# Patient Record
Sex: Female | Born: 1950 | Race: White | Hispanic: No | Marital: Married | Smoking: Former smoker
Health system: Southern US, Community
[De-identification: ages and names within clinical notes are randomized; demographics above are authoritative.]

## PROBLEM LIST (undated history)

## (undated) ENCOUNTER — Emergency Department (HOSPITAL_COMMUNITY): Payer: PPO

## (undated) DIAGNOSIS — R531 Weakness: Secondary | ICD-10-CM

## (undated) DIAGNOSIS — K219 Gastro-esophageal reflux disease without esophagitis: Secondary | ICD-10-CM

## (undated) DIAGNOSIS — M1990 Unspecified osteoarthritis, unspecified site: Secondary | ICD-10-CM

## (undated) DIAGNOSIS — R0600 Dyspnea, unspecified: Secondary | ICD-10-CM

## (undated) DIAGNOSIS — I10 Essential (primary) hypertension: Secondary | ICD-10-CM

## (undated) DIAGNOSIS — H9312 Tinnitus, left ear: Secondary | ICD-10-CM

## (undated) DIAGNOSIS — R112 Nausea with vomiting, unspecified: Secondary | ICD-10-CM

## (undated) DIAGNOSIS — E119 Type 2 diabetes mellitus without complications: Secondary | ICD-10-CM

## (undated) DIAGNOSIS — E8809 Other disorders of plasma-protein metabolism, not elsewhere classified: Secondary | ICD-10-CM

## (undated) DIAGNOSIS — J189 Pneumonia, unspecified organism: Secondary | ICD-10-CM

## (undated) DIAGNOSIS — K5903 Drug induced constipation: Secondary | ICD-10-CM

## (undated) DIAGNOSIS — Z8619 Personal history of other infectious and parasitic diseases: Secondary | ICD-10-CM

## (undated) DIAGNOSIS — E785 Hyperlipidemia, unspecified: Secondary | ICD-10-CM

## (undated) DIAGNOSIS — R5381 Other malaise: Secondary | ICD-10-CM

## (undated) DIAGNOSIS — Z87442 Personal history of urinary calculi: Secondary | ICD-10-CM

## (undated) DIAGNOSIS — C55 Malignant neoplasm of uterus, part unspecified: Secondary | ICD-10-CM

## (undated) DIAGNOSIS — G2581 Restless legs syndrome: Secondary | ICD-10-CM

## (undated) DIAGNOSIS — G709 Myoneural disorder, unspecified: Secondary | ICD-10-CM

## (undated) DIAGNOSIS — G8220 Paraplegia, unspecified: Principal | ICD-10-CM

## (undated) DIAGNOSIS — Z9289 Personal history of other medical treatment: Secondary | ICD-10-CM

## (undated) DIAGNOSIS — F32A Depression, unspecified: Secondary | ICD-10-CM

## (undated) DIAGNOSIS — E46 Unspecified protein-calorie malnutrition: Secondary | ICD-10-CM

## (undated) DIAGNOSIS — Z9989 Dependence on other enabling machines and devices: Secondary | ICD-10-CM

## (undated) DIAGNOSIS — J439 Emphysema, unspecified: Secondary | ICD-10-CM

## (undated) DIAGNOSIS — Z9889 Other specified postprocedural states: Secondary | ICD-10-CM

## (undated) DIAGNOSIS — Z8709 Personal history of other diseases of the respiratory system: Secondary | ICD-10-CM

## (undated) HISTORY — PX: TUMOR EXCISION: SHX421

## (undated) HISTORY — DX: Other malaise: R53.81

## (undated) HISTORY — DX: Unspecified protein-calorie malnutrition: E46

## (undated) HISTORY — PX: BACK SURGERY: SHX140

## (undated) HISTORY — PX: LUMBAR FUSION: SHX111

## (undated) HISTORY — DX: Type 2 diabetes mellitus without complications: E11.9

## (undated) HISTORY — DX: Other disorders of plasma-protein metabolism, not elsewhere classified: E88.09

## (undated) HISTORY — DX: Paraplegia, unspecified: G82.20

## (undated) HISTORY — DX: Gastro-esophageal reflux disease without esophagitis: K21.9

## (undated) HISTORY — DX: Drug induced constipation: K59.03

---
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
## 1977-09-13 DIAGNOSIS — C55 Malignant neoplasm of uterus, part unspecified: Secondary | ICD-10-CM

## 1977-09-13 HISTORY — DX: Malignant neoplasm of uterus, part unspecified: C55

## 1977-09-13 HISTORY — PX: VAGINAL HYSTERECTOMY: SUR661

## 1989-09-13 HISTORY — PX: BLADDER SUSPENSION: SHX72

## 1989-09-13 HISTORY — PX: INGUINAL HERNIA REPAIR: SUR1180

## 1998-03-07 ENCOUNTER — Other Ambulatory Visit: Admission: RE | Admit: 1998-03-07 | Discharge: 1998-03-07 | Payer: Self-pay | Admitting: Otolaryngology

## 1998-06-20 ENCOUNTER — Other Ambulatory Visit: Admission: RE | Admit: 1998-06-20 | Discharge: 1998-06-20 | Payer: Self-pay | Admitting: Otolaryngology

## 1999-03-05 ENCOUNTER — Other Ambulatory Visit: Admission: RE | Admit: 1999-03-05 | Discharge: 1999-03-05 | Payer: Self-pay | Admitting: Gynecology

## 1999-09-14 HISTORY — PX: ANTERIOR CERVICAL DECOMP/DISCECTOMY FUSION: SHX1161

## 1999-11-18 ENCOUNTER — Encounter: Admission: RE | Admit: 1999-11-18 | Discharge: 1999-11-18 | Payer: Self-pay | Admitting: Gastroenterology

## 1999-11-18 ENCOUNTER — Encounter: Payer: Self-pay | Admitting: Gastroenterology

## 2000-03-28 ENCOUNTER — Other Ambulatory Visit: Admission: RE | Admit: 2000-03-28 | Discharge: 2000-03-28 | Payer: Self-pay | Admitting: Gynecology

## 2000-04-14 ENCOUNTER — Ambulatory Visit (HOSPITAL_COMMUNITY): Admission: RE | Admit: 2000-04-14 | Discharge: 2000-04-14 | Payer: Self-pay | Admitting: Gynecology

## 2000-04-14 ENCOUNTER — Encounter: Payer: Self-pay | Admitting: Gynecology

## 2000-11-18 ENCOUNTER — Encounter: Payer: Self-pay | Admitting: Family Medicine

## 2000-11-18 ENCOUNTER — Encounter: Admission: RE | Admit: 2000-11-18 | Discharge: 2000-11-18 | Payer: Self-pay | Admitting: Family Medicine

## 2000-12-06 ENCOUNTER — Inpatient Hospital Stay (HOSPITAL_COMMUNITY): Admission: RE | Admit: 2000-12-06 | Discharge: 2000-12-07 | Payer: Self-pay | Admitting: Neurosurgery

## 2000-12-06 ENCOUNTER — Encounter: Payer: Self-pay | Admitting: Neurosurgery

## 2000-12-28 ENCOUNTER — Encounter: Admission: RE | Admit: 2000-12-28 | Discharge: 2000-12-28 | Payer: Self-pay | Admitting: Neurosurgery

## 2000-12-28 ENCOUNTER — Encounter: Payer: Self-pay | Admitting: Neurosurgery

## 2001-02-13 ENCOUNTER — Encounter: Payer: Self-pay | Admitting: Neurosurgery

## 2001-02-13 ENCOUNTER — Encounter: Admission: RE | Admit: 2001-02-13 | Discharge: 2001-02-13 | Payer: Self-pay | Admitting: Neurosurgery

## 2001-05-30 ENCOUNTER — Other Ambulatory Visit: Admission: RE | Admit: 2001-05-30 | Discharge: 2001-05-30 | Payer: Self-pay | Admitting: Gynecology

## 2001-08-03 ENCOUNTER — Encounter: Admission: RE | Admit: 2001-08-03 | Discharge: 2001-08-03 | Payer: Self-pay | Admitting: Neurosurgery

## 2001-08-03 ENCOUNTER — Encounter: Payer: Self-pay | Admitting: Neurosurgery

## 2001-09-19 ENCOUNTER — Encounter: Payer: Self-pay | Admitting: Family Medicine

## 2001-09-19 ENCOUNTER — Encounter: Admission: RE | Admit: 2001-09-19 | Discharge: 2001-09-19 | Payer: Self-pay | Admitting: Family Medicine

## 2001-11-06 ENCOUNTER — Encounter: Payer: Self-pay | Admitting: Family Medicine

## 2001-11-06 ENCOUNTER — Encounter: Admission: RE | Admit: 2001-11-06 | Discharge: 2001-11-06 | Payer: Self-pay | Admitting: Family Medicine

## 2001-12-18 ENCOUNTER — Encounter: Admission: RE | Admit: 2001-12-18 | Discharge: 2001-12-18 | Payer: Self-pay | Admitting: Family Medicine

## 2001-12-18 ENCOUNTER — Encounter: Payer: Self-pay | Admitting: Family Medicine

## 2002-04-12 ENCOUNTER — Encounter: Payer: Self-pay | Admitting: Family Medicine

## 2002-04-12 ENCOUNTER — Encounter: Admission: RE | Admit: 2002-04-12 | Discharge: 2002-04-12 | Payer: Self-pay | Admitting: Family Medicine

## 2002-06-15 ENCOUNTER — Encounter: Admission: RE | Admit: 2002-06-15 | Discharge: 2002-06-15 | Payer: Self-pay | Admitting: Family Medicine

## 2002-06-15 ENCOUNTER — Encounter: Payer: Self-pay | Admitting: Family Medicine

## 2002-06-18 ENCOUNTER — Other Ambulatory Visit: Admission: RE | Admit: 2002-06-18 | Discharge: 2002-06-18 | Payer: Self-pay | Admitting: Gynecology

## 2003-06-24 ENCOUNTER — Other Ambulatory Visit: Admission: RE | Admit: 2003-06-24 | Discharge: 2003-06-24 | Payer: Self-pay | Admitting: Gynecology

## 2003-09-14 DIAGNOSIS — Z8709 Personal history of other diseases of the respiratory system: Secondary | ICD-10-CM

## 2003-09-14 HISTORY — DX: Personal history of other diseases of the respiratory system: Z87.09

## 2003-09-19 ENCOUNTER — Encounter: Admission: RE | Admit: 2003-09-19 | Discharge: 2003-09-19 | Payer: Self-pay | Admitting: Family Medicine

## 2004-06-30 ENCOUNTER — Other Ambulatory Visit: Admission: RE | Admit: 2004-06-30 | Discharge: 2004-06-30 | Payer: Self-pay | Admitting: Gynecology

## 2005-02-17 ENCOUNTER — Encounter: Admission: RE | Admit: 2005-02-17 | Discharge: 2005-02-17 | Payer: Self-pay | Admitting: Family Medicine

## 2005-07-26 ENCOUNTER — Other Ambulatory Visit: Admission: RE | Admit: 2005-07-26 | Discharge: 2005-07-26 | Payer: Self-pay | Admitting: Gynecology

## 2005-11-26 ENCOUNTER — Encounter: Admission: RE | Admit: 2005-11-26 | Discharge: 2005-11-26 | Payer: Self-pay | Admitting: Family Medicine

## 2007-09-07 ENCOUNTER — Emergency Department (HOSPITAL_COMMUNITY): Admission: EM | Admit: 2007-09-07 | Discharge: 2007-09-07 | Payer: Self-pay | Admitting: Emergency Medicine

## 2007-09-14 DIAGNOSIS — J189 Pneumonia, unspecified organism: Secondary | ICD-10-CM

## 2007-09-14 HISTORY — DX: Pneumonia, unspecified organism: J18.9

## 2007-10-12 ENCOUNTER — Encounter: Admission: RE | Admit: 2007-10-12 | Discharge: 2007-10-12 | Payer: Self-pay | Admitting: Family Medicine

## 2007-11-28 ENCOUNTER — Encounter: Admission: RE | Admit: 2007-11-28 | Discharge: 2007-11-28 | Payer: Self-pay | Admitting: Family Medicine

## 2008-01-03 ENCOUNTER — Encounter: Admission: RE | Admit: 2008-01-03 | Discharge: 2008-01-03 | Payer: Self-pay | Admitting: Family Medicine

## 2011-01-29 NOTE — H&P (Signed)
[place]  Patient:    Megan [name], Megan [name]                     MRN: [id number] Adm. Date:  [id number] Attending:  Danella Penton                         History and Physical  HISTORY:  [name] is a lady who was seen by me about two weeks ago because of neck and left arm pain.  She had been complaining of this pain off and on for the past four or five weeks.  The pain had been getting worse, going to the left arm, and having quite a bit of difficulty.  She cannot sleep.  She denies any pain in the right upper extremity.  She gets some relief of the pain every time she puts the left hand on top of her head.  The patient had an MRI and was sent to [place] for evaluation.  I saw her on November 22, 2000, with the use of conservative treatment.  She called because she was not getting any better.  She came back with her husband.  She agreed with surgery.  PAST MEDICAL HISTORY: 1. Hysterectomy. 2. Bladder surgery. 3. Inguinal hernia.  ALLERGIES:  PENICILLIN, CODEINE, AND VICODIN.  SOCIAL HISTORY:  She smokes one pack.  She drinks socially.  FAMILY HISTORY:  Mother is age 62 with diabetes.  REVIEW OF SYSTEMS:  Positive for urinary incontinence, neck pain, and arm weakness.  PHYSICAL EXAMINATION:  GENERAL:  The patient came to my office with her husband.  She had quite a bit of difficulty with the left arm.  HEENT:  Normal.  NECK:  She is able to flex, but extension and lateralization produces pain going to the left shoulder.  There are no bruits in the carotids.  LUNGS:  Clear.  HEART:  Sounds normal.  ABDOMEN:  Normal.  EXTREMITIES:  Normal pulses.  NEUROLOGIC:  Mental status normal.  Cranial nerves normal.  Strength 5/5 except in the left triceps which is 4/5, also she has weakness of the hypothenar muscle, but not the thenar muscle.  Sensation is normal, but she complains of a tingling sensation which goes into the middle finger.   Reflexes 2+ with absence of the left triceps.  The MRI showed that this lady has a large herniated disk at the level of C6-7, central and to the left.  There is displacement of the spinal cord.  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  RECOMMENDATIONS:  The patient agreed to go ahead with surgery because the pain is not any better.  The procedure will be a one-level anterior cervical fusion at the level of C6-C7.  She knows about the risks such as infection, CSF leak, worsening of the pain, paralysis, and need for further surgery.  She also knows about the possibility of damage to the vocal cords, esophagus, and carotid. DD:  12/06/00 TD:  12/06/00 Job: [id number]

## 2011-01-29 NOTE — Op Note (Signed)
[place]  Patient:    Megan [name], Megan [name]                     MRN: [id number] Proc. Date: 12/06/00 Adm. Date:  [id number] Disc. Date: [id number] Attending:  Danella Penton                           Operative Report  PREOPERATIVE DIAGNOSIS:  C6-C7 herniated disk central to the left.  POSTOPERATIVE DIAGNOSIS:  C6-C7 herniated disk central to the left.  OPERATION PERFORMED:  Anterior C6-C7 diskectomy, decompression of the spinal cord and foraminotomy.  Removal of free fragment in the left side.  Bone bank graft.  Iliac crest.  Synthes plate C6 to C7.  Microscope.  SURGEON:  Tanya Nones. Jeral Fruit, M.D.  ASSISTANT:  ____________  ANESTHESIA:  INDICATIONS FOR PROCEDURE:  [name] is a 60 year old female whom I saw in my office because of sudden onset of neck and left upper extremity pain.  The pain is getting worse.  She has failed with conservative treatment.  MRI showed a large disk at the level of C6-C7.  Surgery was advised.  The patient knew of the risks such as infection, CSF leak, worsening of the pain, paralysis and need for further surgery.  DESCRIPTION OF PROCEDURE:  The patient was taken to the operating room. Intubation was done.  The left side of the neck was prepped with Betadine.  A transverse incision was made through the skin, platysma all the down to the cervical spine.  X-ray showed that indeed, we were at the level of C6 and 7. The anterior ligament was opened.  Immediately, we removed large amounts of degenerative disk.  We brought the microscope into the area.  There was an opening in the posterior ligament with two or three fragments going to the left side.  On top of that, we found quite a bit of spondylosis, left worse than the right one.  At the end, we had good decompression of both foramina with plenty of room for both 7 nerve roots as well as the spinal cord.  Then a piece of iliac crest bone graft of 7 mm height  was inserted, followed by a plate.  Lateral C-spine showed good position of the graft and the plate. The area was irrigated and closed with Vicryl and Steri-Strips.  The patient did well. DD:  12/06/00 TD:  12/07/00 Job: [id number]

## 2011-06-18 LAB — I-STAT 8, (EC8 V) (CONVERTED LAB)
BUN: 22
Bicarbonate: 24.9 — ABNORMAL HIGH
Glucose, Bld: 173 — ABNORMAL HIGH
Hemoglobin: 12.6
Operator id: 257131
Sodium: 129 — ABNORMAL LOW
pCO2, Ven: 42.5 — ABNORMAL LOW

## 2011-06-18 LAB — URINALYSIS, ROUTINE W REFLEX MICROSCOPIC
Nitrite: NEGATIVE
Specific Gravity, Urine: 1.021
pH: 6

## 2011-06-18 LAB — DIFFERENTIAL
Basophils Absolute: 0
Basophils Relative: 0
Eosinophils Absolute: 0
Eosinophils Relative: 0
Monocytes Absolute: 1.7 — ABNORMAL HIGH

## 2011-06-18 LAB — URINE MICROSCOPIC-ADD ON

## 2011-06-18 LAB — POCT I-STAT CREATININE
Creatinine, Ser: 1.3 — ABNORMAL HIGH
Operator id: 257131

## 2011-06-18 LAB — CBC
HCT: 33 — ABNORMAL LOW
Hemoglobin: 11.4 — ABNORMAL LOW
Platelets: 191
RBC: 3.76 — ABNORMAL LOW
RDW: 12.5

## 2015-04-02 ENCOUNTER — Other Ambulatory Visit: Payer: Self-pay | Admitting: Surgery

## 2015-04-02 DIAGNOSIS — M545 Low back pain: Secondary | ICD-10-CM

## 2015-04-05 ENCOUNTER — Ambulatory Visit
Admission: RE | Admit: 2015-04-05 | Discharge: 2015-04-05 | Disposition: A | Discharge status: Home / Self Care | Payer: BLUE CROSS/BLUE SHIELD | Source: Ambulatory Visit | Attending: Surgery | Admitting: Surgery

## 2015-04-05 DIAGNOSIS — M545 Low back pain: Secondary | ICD-10-CM

## 2015-05-07 ENCOUNTER — Other Ambulatory Visit: Payer: Self-pay | Admitting: Neurosurgery

## 2015-05-07 DIAGNOSIS — M4316 Spondylolisthesis, lumbar region: Secondary | ICD-10-CM

## 2015-05-14 ENCOUNTER — Ambulatory Visit
Admission: RE | Admit: 2015-05-14 | Discharge: 2015-05-14 | Disposition: A | Discharge status: Home / Self Care | Payer: BLUE CROSS/BLUE SHIELD | Source: Ambulatory Visit | Attending: Neurosurgery | Admitting: Neurosurgery

## 2015-05-14 DIAGNOSIS — M4316 Spondylolisthesis, lumbar region: Secondary | ICD-10-CM

## 2015-05-14 MED ORDER — DIAZEPAM 5 MG PO TABS
5.0000 mg | ORAL_TABLET | Freq: Once | ORAL | Status: AC
  Administered 2015-05-14: 5 mg via ORAL

## 2015-05-14 MED ORDER — IOHEXOL 180 MG/ML  SOLN
15.0000 mL | Freq: Once | INTRAMUSCULAR | Status: DC | PRN
  Administered 2015-05-14: 15 mL via INTRATHECAL

## 2015-05-14 NOTE — Discharge Instructions (Signed)

## 2015-05-21 ENCOUNTER — Other Ambulatory Visit: Payer: Self-pay | Admitting: Neurosurgery

## 2015-05-30 ENCOUNTER — Encounter (HOSPITAL_COMMUNITY)
Admission: RE | Admit: 2015-05-30 | Discharge: 2015-05-30 | Disposition: A | Discharge status: Home / Self Care | Payer: BLUE CROSS/BLUE SHIELD | Source: Ambulatory Visit | Attending: Neurosurgery | Admitting: Neurosurgery

## 2015-05-30 ENCOUNTER — Encounter (HOSPITAL_COMMUNITY): Payer: Self-pay

## 2015-05-30 DIAGNOSIS — Z87891 Personal history of nicotine dependence: Secondary | ICD-10-CM | POA: Diagnosis not present

## 2015-05-30 DIAGNOSIS — Z01818 Encounter for other preprocedural examination: Secondary | ICD-10-CM | POA: Insufficient documentation

## 2015-05-30 DIAGNOSIS — I10 Essential (primary) hypertension: Secondary | ICD-10-CM | POA: Diagnosis not present

## 2015-05-30 DIAGNOSIS — M4316 Spondylolisthesis, lumbar region: Secondary | ICD-10-CM | POA: Insufficient documentation

## 2015-05-30 DIAGNOSIS — R9431 Abnormal electrocardiogram [ECG] [EKG]: Secondary | ICD-10-CM | POA: Diagnosis not present

## 2015-05-30 DIAGNOSIS — Z0183 Encounter for blood typing: Secondary | ICD-10-CM | POA: Diagnosis not present

## 2015-05-30 DIAGNOSIS — Z01812 Encounter for preprocedural laboratory examination: Secondary | ICD-10-CM | POA: Diagnosis not present

## 2015-05-30 HISTORY — DX: Essential (primary) hypertension: I10

## 2015-05-30 HISTORY — DX: Unspecified osteoarthritis, unspecified site: M19.90

## 2015-05-30 HISTORY — DX: Myoneural disorder, unspecified: G70.9

## 2015-05-30 HISTORY — DX: Nausea with vomiting, unspecified: R11.2

## 2015-05-30 HISTORY — DX: Pneumonia, unspecified organism: J18.9

## 2015-05-30 HISTORY — DX: Other specified postprocedural states: Z98.890

## 2015-05-30 LAB — BASIC METABOLIC PANEL
ANION GAP: 9 (ref 5–15)
BUN: 12 mg/dL (ref 6–20)
CO2: 24 mmol/L (ref 22–32)
Calcium: 9.8 mg/dL (ref 8.9–10.3)
Chloride: 106 mmol/L (ref 101–111)
Creatinine, Ser: 0.93 mg/dL (ref 0.44–1.00)
GFR calc Af Amer: >60 mL/min (ref >60)
GFR calc non Af Amer: >60 mL/min (ref >60)
GLUCOSE: 185 mg/dL — AB (ref 65–99)
POTASSIUM: 5 mmol/L (ref 3.5–5.1)
Sodium: 139 mmol/L (ref 135–145)

## 2015-05-30 LAB — CBC
HEMATOCRIT: 41.2 % (ref 36.0–46.0)
Hemoglobin: 13.8 g/dL (ref 12.0–15.0)
MCH: 30.5 pg (ref 26.0–34.0)
MCHC: 33.5 g/dL (ref 30.0–36.0)
MCV: 90.9 fL (ref 78.0–100.0)
Platelets: 201 10*3/uL (ref 150–400)
RBC: 4.53 MIL/uL (ref 3.87–5.11)
RDW: 12.8 % (ref 11.5–15.5)
WBC: 5 10*3/uL (ref 4.0–10.5)

## 2015-05-30 LAB — ABO/RH: ABO/RH(D): A POS

## 2015-05-30 LAB — TYPE AND SCREEN
ABO/RH(D): A POS
Antibody Screen: NEGATIVE

## 2015-05-30 LAB — SURGICAL PCR SCREEN
MRSA, PCR: NEGATIVE
Staphylococcus aureus: NEGATIVE

## 2015-05-30 LAB — GLUCOSE, CAPILLARY: Glucose-Capillary: 227 mg/dL — ABNORMAL HIGH (ref 65–99)

## 2015-05-30 NOTE — Pre-Procedure Instructions (Signed)
Megan [name]  05/30/2015      [facility] [address] [place], [place] [address] Phone: [phone] Fax: [phone]    Your procedure is scheduled on  Tuesday 06/10/15.  Report to [facility] at  [employer].M.  Call this number if you have problems the morning of surgery:  [phone]   Remember:  Do not eat food or drink liquids after midnight.  Take these medicines the morning of surgery with A SIP OF WATER     (STOP IBUPROFEN / ADVIL/ MOTRIN)   Do not wear jewelry, make-up or nail polish.  Do not wear lotions, powders, or perfumes.  You may wear deodorant.  Do not shave 48 hours prior to surgery.  Men may shave face and neck.  Do not bring valuables to the hospital.  [facility] is not responsible for any belongings or valuables.  Contacts, dentures or bridgework may not be worn into surgery.  Leave your suitcase in the car.  After surgery it may be brought to your room.  For patients admitted to the hospital, discharge time will be determined by your treatment team.  Patients discharged the day of surgery will not be allowed to drive home.   Name and phone number of your driver:    Special instructions:  [facility] - Preparing for Surgery  Before surgery, you can play an important role.  Because skin is not sterile, your skin needs to be as free of germs as possible.  You can reduce the number of germs on you skin by washing with CHG (chlorahexidine gluconate) soap before surgery.  CHG is an antiseptic cleaner which kills germs and bonds with the skin to continue killing germs even after washing.  Please DO NOT use if you have an allergy to CHG or antibacterial soaps.  If your skin becomes reddened/irritated stop using the CHG and inform your nurse when you arrive at Short Stay.  Do not shave (including legs and underarms) for at least 48 hours prior to the first CHG shower.  You  may shave your face.  Please follow these instructions carefully:   1.  Shower with CHG Soap the night before surgery and the                                morning of Surgery.  2.  If you choose to wash your hair, wash your hair first as usual with your       normal shampoo.  3.  After you shampoo, rinse your hair and body thoroughly to remove the                      Shampoo.  4.  Use CHG as you would any other liquid soap.  You can apply chg directly       to the skin and wash gently with scrungie or a clean washcloth.  5.  Apply the CHG Soap to your body ONLY FROM THE NECK DOWN.        Do not use on open wounds or open sores.  Avoid contact with your eyes,       ears, mouth and genitals (private parts).  Wash genitals (private parts)       with your normal soap.  6.  Wash thoroughly, paying special attention to the area where  your surgery        will be performed.  7.  Thoroughly rinse your body with warm water from the neck down.  8.  DO NOT shower/wash with your normal soap after using and rinsing off       the CHG Soap.  9.  Pat yourself dry with a clean towel.            10.  Wear clean pajamas.            11.  Place clean sheets on your bed the night of your first shower and do not        sleep with pets.  Day of Surgery  Do not apply any lotions/deoderants the morning of surgery.  Please wear clean clothes to the hospital/surgery center.    Please read over the following fact sheets that you were given. Pain Booklet, Coughing and Deep Breathing, Blood Transfusion Information, MRSA Information and Surgical Site Infection Prevention    How to Manage Your Diabetes Before Surgery   Why is it important to control my blood sugar before and after surgery?   Improving blood sugar levels before and after surgery helps healing and can limit problems.  A way of improving blood sugar control is eating a healthy diet by:  - Eating less sugar and carbohydrates  - Increasing  activity/exercise  - Talk with your doctor about reaching your blood sugar goals  High blood sugars (greater than 180 mg/dL) can raise your risk of infections and slow down your recovery so you will need to focus on controlling your diabetes during the weeks before surgery.  Make sure that the doctor who takes care of your diabetes knows about your planned surgery including the date and location.  How do I manage my blood sugars before surgery?   Check your blood sugar at least 4 times a day, 2 days before surgery to make sure that they are not too high or low.   Check your blood sugar the morning of your surgery when you wake up and every 2               hours until you get to the Short-Stay unit.  If your blood sugar is less than 70 mg/dL, you will need to treat for low blood sugar by:  Treat a low blood sugar (less than 70 mg/dL) with 1/2 cup of clear juice (cranberry or apple), 4 glucose tablets, OR glucose gel.  Recheck blood sugar in 15 minutes after treatment (to make sure it is greater than 70 mg/dL).  If blood sugar is not greater than 70 mg/dL on re-check, call [phone] for further instructions.   Report your blood sugar to the Short-Stay nurse when you get to Short-Stay.  References:  [facility], 2007 "How to Manage your Diabetes Before and After Surgery".  What do I do about my diabetes medications?   Do not take oral diabetes medicines (pills) the morning of surge   For patients with "Insulin Pumps":  Contact your diabetes doctor for specific instructions before surgery.   Decrease basal insulin rates by 20% at midnight the night before surgery.  Note that if your surgery is planned to be longer than 2 hours, your insulin pump will be removed and intravenous (IV) insulin will be started and managed by the nurses and anesthesiologist.  You will be able to restart your insulin pump once you are awake and able to manage it.  Make  sure  to bring insulin pump supplies to the hospital with you in case your site needs to be changed.

## 2015-05-31 LAB — HEMOGLOBIN A1C
HEMOGLOBIN A1C: 6.8 % — AB (ref 4.8–5.6)
MEAN PLASMA GLUCOSE: 148 mg/dL

## 2015-06-02 ENCOUNTER — Encounter (HOSPITAL_COMMUNITY): Payer: Self-pay

## 2015-06-02 NOTE — Progress Notes (Signed)
Anesthesia Chart Review:  Pt is 64 year old female scheduled for L4-5 decompression and fusion on 06/10/2015 with Dr. Joya Salm.   PCP is Lennie Odor, PA at [place].   PMH includes: borderline DM. HTN (not on meds, controlled per PCP notes 03/2015; BP at PAT was 177/70). Former smoker. BMI 27.   Anesthesia history includes post-op N/V.   Medications include: ibuprofen.   Preoperative labs reviewed.  HgbA1c 6.8, glucose 185.   EKG 05/30/2015: Sinus rhythm with short PR. LAD. LAD. Septal infarct new since 2002 per Dr. Olin Pia interpretation.   Reviewed EKG/case with Dr. Lissa Hoard.   If no changes, I anticipate pt can proceed with surgery as scheduled.   Willeen Cass, FNP-BC [facility]/Anesthesiology Phone: [phone] 06/02/2015 4:14 PM

## 2015-06-09 MED ORDER — VANCOMYCIN HCL IN DEXTROSE 1-5 GM/200ML-% IV SOLN
1000.0000 mg | INTRAVENOUS | Status: AC
  Administered 2015-06-10: 1000 mg via INTRAVENOUS
  Filled 2015-06-09: qty 200

## 2015-06-09 NOTE — Progress Notes (Signed)
Pt. Informed of time change for start of surgery, arrival time is unchanged.

## 2015-06-09 NOTE — H&P (Signed)
Megan [name] is an 64 y.o. female.   Chief Complaint: right leg pain HPI: patient who has been complaining of lumbar pain with radiation to the right leg no better with conservative treatment. Unable to work she was advised by a Museum/gallery conservator about the need for surgery  Past Medical History  Diagnosis Date  . PONV (postoperative nausea and vomiting)   . Pneumonia     2008  . Neuromuscular disorder     tingling toes  . Diabetes mellitus without complication     borderline  . Arthritis   . Bladder infection     history  . Hypertension     not on meds 06/02/15    Past Surgical History  Procedure Laterality Date  . Abdominal hysterectomy      1979  . Hernia repair      bladder tack + hernia 1991  . Tumor excision      WERTHIN'S TUMORS BOTH SIDES OF NECK  . Cervical fusion      2001    No family history on file. Social History:  reports that she quit smoking about 9 years ago. Her smoking use included Cigarettes. She smoked 1.50 packs per day. She does not have any smokeless tobacco history on file. She reports that she drinks alcohol. She reports that she does not use illicit drugs.  Allergies:  Allergies  Allergen Reactions  . Shellfish Allergy Swelling and Other (See Comments)    Tongue swells  . Oxycodone Nausea Only  . Penicillins Rash    No prescriptions prior to admission    No results found for this or any previous visit (from the past 48 hour(s)). No results found.  Review of Systems  Constitutional: Negative.   Eyes: Negative.   Respiratory: Negative.   Cardiovascular: Negative.   Gastrointestinal: Negative.   Genitourinary: Positive for frequency.  Musculoskeletal: Positive for back pain and neck pain.  Skin: Negative.   Neurological: Positive for sensory change and focal weakness.  Endo/Heme/Allergies: Negative.   Psychiatric/Behavioral: Negative.     There were no vitals taken for this visit. Physical Exam hent,nl. Neck, nl. Cv, nl.  Lungs,clear.omen, nl. Extremities, nl. NEURO weakness of right foot, slr positive bilaterally at 60 degrees. Sensory, nl. Myelogram shows a lumbar spondylolisthesis at l4-5 with stenosis about 7 mms. At l2-3 mild stenosis with a stable 3 mms spondylolisthesis  Assessment/Plan Patient to go ahead with decompression and fusion at l4-5. She and her husband are aware of risks  And benefits  BOTERO,ERNESTO M 06/09/2015, 5:39 PM

## 2015-06-10 ENCOUNTER — Inpatient Hospital Stay (HOSPITAL_COMMUNITY): Payer: BLUE CROSS/BLUE SHIELD

## 2015-06-10 ENCOUNTER — Inpatient Hospital Stay (HOSPITAL_COMMUNITY): Payer: BLUE CROSS/BLUE SHIELD | Admitting: Anesthesiology

## 2015-06-10 ENCOUNTER — Inpatient Hospital Stay (HOSPITAL_COMMUNITY): Payer: BLUE CROSS/BLUE SHIELD | Admitting: Emergency Medicine

## 2015-06-10 ENCOUNTER — Inpatient Hospital Stay (HOSPITAL_COMMUNITY)
Admission: RE | Admit: 2015-06-10 | Discharge: 2015-06-17 | DRG: 460 | Disposition: A | Discharge status: Home / Self Care | Payer: BLUE CROSS/BLUE SHIELD | Source: Ambulatory Visit | Attending: Neurosurgery | Admitting: Neurosurgery

## 2015-06-10 ENCOUNTER — Encounter (HOSPITAL_COMMUNITY)
Admission: RE | Disposition: A | Discharge status: Home / Self Care | Payer: BLUE CROSS/BLUE SHIELD | Source: Ambulatory Visit | Attending: Neurosurgery

## 2015-06-10 ENCOUNTER — Encounter (HOSPITAL_COMMUNITY): Payer: Self-pay | Admitting: *Deleted

## 2015-06-10 DIAGNOSIS — Z90710 Acquired absence of both cervix and uterus: Secondary | ICD-10-CM | POA: Diagnosis not present

## 2015-06-10 DIAGNOSIS — M4806 Spinal stenosis, lumbar region: Principal | ICD-10-CM | POA: Diagnosis present

## 2015-06-10 DIAGNOSIS — Z91013 Allergy to seafood: Secondary | ICD-10-CM | POA: Diagnosis not present

## 2015-06-10 DIAGNOSIS — M4316 Spondylolisthesis, lumbar region: Secondary | ICD-10-CM | POA: Diagnosis present

## 2015-06-10 DIAGNOSIS — Z419 Encounter for procedure for purposes other than remedying health state, unspecified: Secondary | ICD-10-CM

## 2015-06-10 DIAGNOSIS — Z885 Allergy status to narcotic agent status: Secondary | ICD-10-CM

## 2015-06-10 DIAGNOSIS — M79604 Pain in right leg: Secondary | ICD-10-CM | POA: Diagnosis present

## 2015-06-10 DIAGNOSIS — M1990 Unspecified osteoarthritis, unspecified site: Secondary | ICD-10-CM | POA: Diagnosis present

## 2015-06-10 DIAGNOSIS — Z880 Allergy status to penicillin: Secondary | ICD-10-CM

## 2015-06-10 DIAGNOSIS — E119 Type 2 diabetes mellitus without complications: Secondary | ICD-10-CM | POA: Diagnosis present

## 2015-06-10 DIAGNOSIS — Z87891 Personal history of nicotine dependence: Secondary | ICD-10-CM

## 2015-06-10 DIAGNOSIS — I10 Essential (primary) hypertension: Secondary | ICD-10-CM | POA: Diagnosis present

## 2015-06-10 HISTORY — DX: Spondylolisthesis, lumbar region: M43.16

## 2015-06-10 LAB — GLUCOSE, CAPILLARY
GLUCOSE-CAPILLARY: 197 mg/dL — AB (ref 65–99)
Glucose-Capillary: 178 mg/dL — ABNORMAL HIGH (ref 65–99)
Glucose-Capillary: 214 mg/dL — ABNORMAL HIGH (ref 65–99)
Glucose-Capillary: 231 mg/dL — ABNORMAL HIGH (ref 65–99)

## 2015-06-10 SURGERY — POSTERIOR LUMBAR FUSION 1 LEVEL
Anesthesia: General

## 2015-06-10 MED ORDER — ONDANSETRON HCL 4 MG/2ML IJ SOLN
4.0000 mg | Freq: Once | INTRAMUSCULAR | Status: AC | PRN
  Administered 2015-06-10: 4 mg via INTRAVENOUS

## 2015-06-10 MED ORDER — ONDANSETRON HCL 4 MG/2ML IJ SOLN
INTRAMUSCULAR | Status: AC
  Filled 2015-06-10: qty 2

## 2015-06-10 MED ORDER — GLYCOPYRROLATE 0.2 MG/ML IJ SOLN
INTRAMUSCULAR | Status: AC
  Filled 2015-06-10: qty 1

## 2015-06-10 MED ORDER — VANCOMYCIN HCL 1000 MG IV SOLR
INTRAVENOUS | Status: DC | PRN
  Administered 2015-06-10: 1000 mg

## 2015-06-10 MED ORDER — FENTANYL CITRATE (PF) 250 MCG/5ML IJ SOLN
INTRAMUSCULAR | Status: AC
  Filled 2015-06-10: qty 5

## 2015-06-10 MED ORDER — THROMBIN 20000 UNITS EX SOLR
CUTANEOUS | Status: DC | PRN
  Administered 2015-06-10: 08:00:00 via TOPICAL

## 2015-06-10 MED ORDER — PHENYLEPHRINE 40 MCG/ML (10ML) SYRINGE FOR IV PUSH (FOR BLOOD PRESSURE SUPPORT)
PREFILLED_SYRINGE | INTRAVENOUS | Status: AC
  Filled 2015-06-10: qty 10

## 2015-06-10 MED ORDER — MIDAZOLAM HCL 2 MG/2ML IJ SOLN
INTRAMUSCULAR | Status: AC
  Filled 2015-06-10: qty 4

## 2015-06-10 MED ORDER — HYDROMORPHONE HCL 1 MG/ML IJ SOLN
INTRAMUSCULAR | Status: AC
  Filled 2015-06-10: qty 1

## 2015-06-10 MED ORDER — ROCURONIUM BROMIDE 100 MG/10ML IV SOLN
INTRAVENOUS | Status: DC | PRN
  Administered 2015-06-10: 50 mg via INTRAVENOUS

## 2015-06-10 MED ORDER — DIPHENHYDRAMINE HCL 50 MG/ML IJ SOLN: 12.5000 mg | Freq: Four times a day (QID) | INTRAMUSCULAR | Status: DC | PRN

## 2015-06-10 MED ORDER — SODIUM CHLORIDE 0.9 % IV SOLN: 250.0000 mL | INTRAVENOUS | Status: DC

## 2015-06-10 MED ORDER — PROPOFOL 10 MG/ML IV BOLUS
INTRAVENOUS | Status: AC
  Filled 2015-06-10: qty 20

## 2015-06-10 MED ORDER — EPHEDRINE SULFATE 50 MG/ML IJ SOLN
INTRAMUSCULAR | Status: DC | PRN
  Administered 2015-06-10 (×5): 5 mg via INTRAVENOUS

## 2015-06-10 MED ORDER — FENTANYL CITRATE (PF) 100 MCG/2ML IJ SOLN
INTRAMUSCULAR | Status: DC | PRN
  Administered 2015-06-10 (×2): 50 ug via INTRAVENOUS
  Administered 2015-06-10 (×2): 100 ug via INTRAVENOUS
  Administered 2015-06-10: 50 ug via INTRAVENOUS

## 2015-06-10 MED ORDER — DIPHENHYDRAMINE HCL 12.5 MG/5ML PO ELIX: 12.5000 mg | ORAL_SOLUTION | Freq: Four times a day (QID) | ORAL | Status: DC | PRN

## 2015-06-10 MED ORDER — VANCOMYCIN HCL 1000 MG IV SOLR
INTRAVENOUS | Status: AC
  Filled 2015-06-10: qty 1000

## 2015-06-10 MED ORDER — ACETAMINOPHEN 650 MG RE SUPP: 650.0000 mg | RECTAL | Status: DC | PRN

## 2015-06-10 MED ORDER — LACTATED RINGERS IV SOLN
INTRAVENOUS | Status: DC | PRN
  Administered 2015-06-10 (×2): via INTRAVENOUS

## 2015-06-10 MED ORDER — ONDANSETRON HCL 4 MG/2ML IJ SOLN: 4.0000 mg | Freq: Four times a day (QID) | INTRAMUSCULAR | Status: DC | PRN

## 2015-06-10 MED ORDER — SODIUM CHLORIDE 0.9 % IV SOLN
INTRAVENOUS | Status: DC
  Administered 2015-06-10 (×2): via INTRAVENOUS

## 2015-06-10 MED ORDER — ONDANSETRON HCL 4 MG/2ML IJ SOLN
4.0000 mg | INTRAMUSCULAR | Status: DC | PRN
  Administered 2015-06-16 – 2015-06-17 (×2): 4 mg via INTRAVENOUS
  Filled 2015-06-10 (×3): qty 2

## 2015-06-10 MED ORDER — LIDOCAINE HCL (CARDIAC) 20 MG/ML IV SOLN
INTRAVENOUS | Status: DC | PRN
  Administered 2015-06-10: 100 mg via INTRAVENOUS

## 2015-06-10 MED ORDER — INSULIN ASPART 100 UNIT/ML ~~LOC~~ SOLN: 0.0000 [IU] | Freq: Every day | SUBCUTANEOUS | Status: DC

## 2015-06-10 MED ORDER — BUPIVACAINE LIPOSOME 1.3 % IJ SUSP
20.0000 mL | Freq: Once | INTRAMUSCULAR | Status: AC
  Administered 2015-06-10: 20 mL
  Filled 2015-06-10: qty 20

## 2015-06-10 MED ORDER — SODIUM CHLORIDE 0.9 % IJ SOLN
3.0000 mL | Freq: Two times a day (BID) | INTRAMUSCULAR | Status: DC
  Administered 2015-06-11 – 2015-06-17 (×10): 3 mL via INTRAVENOUS

## 2015-06-10 MED ORDER — INSULIN ASPART 100 UNIT/ML ~~LOC~~ SOLN: 0.0000 [IU] | Freq: Three times a day (TID) | SUBCUTANEOUS | Status: DC

## 2015-06-10 MED ORDER — SUGAMMADEX SODIUM 200 MG/2ML IV SOLN
INTRAVENOUS | Status: AC
  Filled 2015-06-10: qty 2

## 2015-06-10 MED ORDER — DIAZEPAM 5 MG PO TABS
5.0000 mg | ORAL_TABLET | Freq: Four times a day (QID) | ORAL | Status: DC | PRN
  Administered 2015-06-14 (×2): 5 mg via ORAL
  Filled 2015-06-10 (×2): qty 1

## 2015-06-10 MED ORDER — PROCHLORPERAZINE EDISYLATE 5 MG/ML IJ SOLN
10.0000 mg | Freq: Four times a day (QID) | INTRAMUSCULAR | Status: DC | PRN
  Administered 2015-06-10 – 2015-06-16 (×8): 10 mg via INTRAVENOUS
  Filled 2015-06-10 (×11): qty 2

## 2015-06-10 MED ORDER — 0.9 % SODIUM CHLORIDE (POUR BTL) OPTIME
TOPICAL | Status: DC | PRN
  Administered 2015-06-10: 1000 mL

## 2015-06-10 MED ORDER — GELATIN ABSORBABLE MT POWD
OROMUCOSAL | Status: DC | PRN
  Administered 2015-06-10: 08:00:00 via TOPICAL

## 2015-06-10 MED ORDER — ONDANSETRON HCL 4 MG/2ML IJ SOLN
INTRAMUSCULAR | Status: DC | PRN
  Administered 2015-06-10 (×2): 4 mg via INTRAVENOUS

## 2015-06-10 MED ORDER — VANCOMYCIN HCL IN DEXTROSE 1-5 GM/200ML-% IV SOLN
1000.0000 mg | Freq: Once | INTRAVENOUS | Status: AC
  Administered 2015-06-10: 1000 mg via INTRAVENOUS
  Filled 2015-06-10: qty 200

## 2015-06-10 MED ORDER — CEFAZOLIN SODIUM 1-5 GM-% IV SOLN: 1.0000 g | Freq: Three times a day (TID) | INTRAVENOUS | Status: DC

## 2015-06-10 MED ORDER — PHENYLEPHRINE HCL 10 MG/ML IJ SOLN
10.0000 mg | INTRAVENOUS | Status: DC | PRN
  Administered 2015-06-10: 10 ug/min via INTRAVENOUS

## 2015-06-10 MED ORDER — DEXAMETHASONE SODIUM PHOSPHATE 10 MG/ML IJ SOLN
INTRAMUSCULAR | Status: AC
Start: 2015-06-10 — End: 2015-06-10
  Filled 2015-06-10: qty 1

## 2015-06-10 MED ORDER — MORPHINE SULFATE 1 MG/ML IV SOLN
INTRAVENOUS | Status: DC
  Administered 2015-06-10: 12:00:00 via INTRAVENOUS
  Administered 2015-06-10: 13.5 mg via INTRAVENOUS
  Administered 2015-06-10 – 2015-06-11 (×2): 1.5 mg via INTRAVENOUS
  Administered 2015-06-11 (×2): 6 mg via INTRAVENOUS
  Administered 2015-06-12: 09:00:00 via INTRAVENOUS
  Administered 2015-06-13: 1.5 mg via INTRAVENOUS
  Filled 2015-06-10 (×2): qty 25

## 2015-06-10 MED ORDER — HYDROMORPHONE HCL 1 MG/ML IJ SOLN
0.2500 mg | INTRAMUSCULAR | Status: DC | PRN
  Administered 2015-06-10 (×4): 0.5 mg via INTRAVENOUS

## 2015-06-10 MED ORDER — ACETAMINOPHEN 325 MG PO TABS
650.0000 mg | ORAL_TABLET | ORAL | Status: DC | PRN
  Filled 2015-06-10: qty 2

## 2015-06-10 MED ORDER — EPHEDRINE SULFATE 50 MG/ML IJ SOLN
INTRAMUSCULAR | Status: AC
  Filled 2015-06-10: qty 1

## 2015-06-10 MED ORDER — ROCURONIUM BROMIDE 50 MG/5ML IV SOLN
INTRAVENOUS | Status: AC
  Filled 2015-06-10: qty 1

## 2015-06-10 MED ORDER — SUCCINYLCHOLINE CHLORIDE 20 MG/ML IJ SOLN
INTRAMUSCULAR | Status: AC
  Filled 2015-06-10: qty 1

## 2015-06-10 MED ORDER — MEPERIDINE HCL 25 MG/ML IJ SOLN: 6.2500 mg | INTRAMUSCULAR | Status: DC | PRN

## 2015-06-10 MED ORDER — PROPOFOL 10 MG/ML IV BOLUS
INTRAVENOUS | Status: DC | PRN
  Administered 2015-06-10: 120 mg via INTRAVENOUS

## 2015-06-10 MED ORDER — SODIUM CHLORIDE 0.9 % IJ SOLN
3.0000 mL | INTRAMUSCULAR | Status: DC | PRN
  Administered 2015-06-14: 3 mL via INTRAVENOUS
  Filled 2015-06-10: qty 3

## 2015-06-10 MED ORDER — DEXAMETHASONE SODIUM PHOSPHATE 10 MG/ML IJ SOLN
INTRAMUSCULAR | Status: DC | PRN
  Administered 2015-06-10: 10 mg via INTRAVENOUS

## 2015-06-10 MED ORDER — SODIUM CHLORIDE 0.9 % IJ SOLN: 9.0000 mL | INTRAMUSCULAR | Status: DC | PRN

## 2015-06-10 MED ORDER — LIDOCAINE HCL (CARDIAC) 20 MG/ML IV SOLN
INTRAVENOUS | Status: AC
  Filled 2015-06-10: qty 5

## 2015-06-10 MED ORDER — MIDAZOLAM HCL 5 MG/5ML IJ SOLN
INTRAMUSCULAR | Status: DC | PRN
  Administered 2015-06-10: 2 mg via INTRAVENOUS

## 2015-06-10 MED ORDER — SODIUM CHLORIDE 0.9 % IJ SOLN
INTRAMUSCULAR | Status: AC
  Filled 2015-06-10: qty 10

## 2015-06-10 MED ORDER — DIPHENHYDRAMINE HCL 50 MG/ML IJ SOLN
INTRAMUSCULAR | Status: DC | PRN
  Administered 2015-06-10: 10 mg via INTRAVENOUS

## 2015-06-10 MED ORDER — MENTHOL 3 MG MT LOZG: 1.0000 | LOZENGE | OROMUCOSAL | Status: DC | PRN

## 2015-06-10 MED ORDER — PHENOL 1.4 % MT LIQD: 1.0000 | OROMUCOSAL | Status: DC | PRN

## 2015-06-10 MED ORDER — MORPHINE SULFATE 1 MG/ML IV SOLN
INTRAVENOUS | Status: AC
  Filled 2015-06-10: qty 25

## 2015-06-10 MED ORDER — DIPHENHYDRAMINE HCL 50 MG/ML IJ SOLN
INTRAMUSCULAR | Status: AC
  Filled 2015-06-10: qty 1

## 2015-06-10 MED ORDER — NALOXONE HCL 0.4 MG/ML IJ SOLN: 0.4000 mg | INTRAMUSCULAR | Status: DC | PRN

## 2015-06-10 SURGICAL SUPPLY — 70 items
APL SKNCLS STERI-STRIP NONHPOA (GAUZE/BANDAGES/DRESSINGS) ×1
BENZOIN TINCTURE PRP APPL 2/3 (GAUZE/BANDAGES/DRESSINGS) ×2 IMPLANT
BLADE CLIPPER SURG (BLADE) IMPLANT
BUR ACORN 6.0 (BURR) ×2 IMPLANT
BUR MATCHSTICK NEURO 3.0 LAGG (BURR) ×2 IMPLANT
CANISTER SUCT 3000ML PPV (MISCELLANEOUS) ×2 IMPLANT
CAP LOCKING THREADED (Cap) ×4 IMPLANT
CONT SPEC 4OZ CLIKSEAL STRL BL (MISCELLANEOUS) ×2 IMPLANT
CORDS BIPOLAR (ELECTRODE) ×1 IMPLANT
COVER BACK TABLE 60X90IN (DRAPES) ×2 IMPLANT
CROSSLINK SPINAL 38-50MM (Neuro Prosthesis/Implant) ×1 IMPLANT
DRAPE C-ARM 42X72 X-RAY (DRAPES) ×4 IMPLANT
DRAPE LAPAROTOMY 100X72X124 (DRAPES) ×2 IMPLANT
DRAPE POUCH INSTRU U-SHP 10X18 (DRAPES) ×2 IMPLANT
DRAPE PROXIMA HALF (DRAPES) ×1 IMPLANT
DRSG OPSITE POSTOP 4X6 (GAUZE/BANDAGES/DRESSINGS) ×1 IMPLANT
DRSG PAD ABDOMINAL 8X10 ST (GAUZE/BANDAGES/DRESSINGS) IMPLANT
DURAPREP 26ML APPLICATOR (WOUND CARE) ×2 IMPLANT
ELECT REM PT RETURN 9FT ADLT (ELECTROSURGICAL) ×2
ELECTRODE REM PT RTRN 9FT ADLT (ELECTROSURGICAL) ×1 IMPLANT
EVACUATOR 1/8 PVC DRAIN (DRAIN) IMPLANT
GAUZE SPONGE 4X4 12PLY STRL (GAUZE/BANDAGES/DRESSINGS) ×2 IMPLANT
GAUZE SPONGE 4X4 16PLY XRAY LF (GAUZE/BANDAGES/DRESSINGS) ×2 IMPLANT
GLOVE BIO SURGEON STRL SZ7 (GLOVE) ×3 IMPLANT
GLOVE BIOGEL M 8.0 STRL (GLOVE) ×2 IMPLANT
GLOVE ECLIPSE 7.0 STRL STRAW (GLOVE) ×1 IMPLANT
GLOVE EXAM NITRILE LRG STRL (GLOVE) IMPLANT
GLOVE EXAM NITRILE MD LF STRL (GLOVE) IMPLANT
GLOVE EXAM NITRILE XL STR (GLOVE) IMPLANT
GLOVE EXAM NITRILE XS STR PU (GLOVE) IMPLANT
GLOVE INDICATOR 7.0 STRL GRN (GLOVE) ×2 IMPLANT
GLOVE INDICATOR 7.5 STRL GRN (GLOVE) ×3 IMPLANT
GOWN STRL REUS W/ TWL LRG LVL3 (GOWN DISPOSABLE) ×1 IMPLANT
GOWN STRL REUS W/ TWL XL LVL3 (GOWN DISPOSABLE) IMPLANT
GOWN STRL REUS W/TWL 2XL LVL3 (GOWN DISPOSABLE) IMPLANT
GOWN STRL REUS W/TWL LRG LVL3 (GOWN DISPOSABLE) ×2
GOWN STRL REUS W/TWL XL LVL3 (GOWN DISPOSABLE)
HEMOSTAT POWDER SURGIFOAM 1G (HEMOSTASIS) ×1 IMPLANT
KIT BASIN OR (CUSTOM PROCEDURE TRAY) ×2 IMPLANT
KIT INFUSE MEDIUM (Orthopedic Implant) ×1 IMPLANT
KIT ROOM TURNOVER OR (KITS) ×2 IMPLANT
NDL HYPO 18GX1.5 BLUNT FILL (NEEDLE) IMPLANT
NDL HYPO 21X1.5 SAFETY (NEEDLE) IMPLANT
NDL HYPO 25X1 1.5 SAFETY (NEEDLE) IMPLANT
NEEDLE HYPO 18GX1.5 BLUNT FILL (NEEDLE) IMPLANT
NEEDLE HYPO 21X1.5 SAFETY (NEEDLE) ×2 IMPLANT
NEEDLE HYPO 25X1 1.5 SAFETY (NEEDLE) IMPLANT
NS IRRIG 1000ML POUR BTL (IV SOLUTION) ×2 IMPLANT
PACK LAMINECTOMY NEURO (CUSTOM PROCEDURE TRAY) ×2 IMPLANT
PAD ARMBOARD 7.5X6 YLW CONV (MISCELLANEOUS) ×6 IMPLANT
PATTIES SURGICAL .5 X1 (DISPOSABLE) ×2 IMPLANT
PATTIES SURGICAL .5 X3 (DISPOSABLE) IMPLANT
PENCIL BUTTON HOLSTER BLD 10FT (ELECTRODE) ×1 IMPLANT
ROD CREO 45MM SPINAL (Rod) ×2 IMPLANT
SCREW SPINE 40X5.5XPA CREO (Screw) IMPLANT
SCREW SPINE CREO 5.5X40 (Screw) ×8 IMPLANT
SPACER RISE 8X22 8-14MM-10 (Neuro Prosthesis/Implant) ×2 IMPLANT
SPONGE LAP 4X18 X RAY DECT (DISPOSABLE) IMPLANT
SPONGE NEURO XRAY DETECT 1X3 (DISPOSABLE) IMPLANT
SPONGE SURGIFOAM ABS GEL 100 (HEMOSTASIS) ×2 IMPLANT
STRIP CLOSURE SKIN 1/2X4 (GAUZE/BANDAGES/DRESSINGS) ×2 IMPLANT
SUT VIC AB 1 CT1 18XBRD ANBCTR (SUTURE) ×2 IMPLANT
SUT VIC AB 1 CT1 8-18 (SUTURE) ×4
SUT VIC AB 2-0 CP2 18 (SUTURE) ×2 IMPLANT
SUT VIC AB 3-0 SH 8-18 (SUTURE) ×2 IMPLANT
SYR 5ML LL (SYRINGE) IMPLANT
TOWEL OR 17X24 6PK STRL BLUE (TOWEL DISPOSABLE) ×2 IMPLANT
TOWEL OR 17X26 10 PK STRL BLUE (TOWEL DISPOSABLE) ×2 IMPLANT
TRAY FOLEY W/METER SILVER 14FR (SET/KITS/TRAYS/PACK) ×2 IMPLANT
WATER STERILE IRR 1000ML POUR (IV SOLUTION) ×2 IMPLANT

## 2015-06-10 NOTE — Anesthesia Procedure Notes (Signed)
Procedure Name: Intubation Date/Time: 06/10/2015 8:10 AM Performed by: Luciana Axe K Pre-anesthesia Checklist: Patient identified, Suction available, Patient being monitored, Emergency Drugs available and Timeout performed Patient Re-evaluated:Patient Re-evaluated prior to inductionOxygen Delivery Method: Circle system utilized Preoxygenation: Pre-oxygenation with 100% oxygen Intubation Type: IV induction Ventilation: Mask ventilation without difficulty Laryngoscope Size: Miller and 2 Grade View: Grade I Tube type: Oral Tube size: 7.0 mm Number of attempts: 1 Airway Equipment and Method: Stylet Placement Confirmation: ETT inserted through vocal cords under direct vision,  positive ETCO2,  CO2 detector and breath sounds checked- equal and bilateral Secured at: 21 cm Tube secured with: Tape Dental Injury: Teeth and Oropharynx as per pre-operative assessment

## 2015-06-10 NOTE — Anesthesia Preprocedure Evaluation (Addendum)
Anesthesia Evaluation  Patient identified by MRN, date of birth, ID band Patient awake    Reviewed: Allergy & Precautions, NPO status , Patient's Chart, lab work & pertinent test results  History of Anesthesia Complications (+) PONV and history of anesthetic complications  Airway Mallampati: I  TM Distance: >3 FB Neck ROM: Full    Dental  (+) Teeth Intact, Dental Advisory Given   Pulmonary former smoker,    Pulmonary exam normal breath sounds clear to auscultation       Cardiovascular hypertension, Normal cardiovascular exam Rhythm:Regular Rate:Normal     Neuro/Psych    GI/Hepatic   Endo/Other  diabetes, Type 2  Renal/GU      Musculoskeletal   Abdominal   Peds  Hematology   Anesthesia Other Findings   Reproductive/Obstetrics                            Anesthesia Physical Anesthesia Plan  ASA: II  Anesthesia Plan: General   Post-op Pain Management:    Induction: Intravenous  Airway Management Planned: Oral ETT  Additional Equipment:   Intra-op Plan:   Post-operative Plan: Extubation in OR  Informed Consent: I have reviewed the patients History and Physical, chart, labs and discussed the procedure including the risks, benefits and alternatives for the proposed anesthesia with the patient or authorized representative who has indicated his/her understanding and acceptance.     Plan Discussed with: CRNA and Surgeon  Anesthesia Plan Comments:         Anesthesia Quick Evaluation

## 2015-06-10 NOTE — Care Management Note (Signed)
Case Management Note  Patient Details  Name: Megan [name] MRN: [id number] Date of Birth: 01-Mar-1951  Subjective/Objective:                  Right leg pain; 06/10/15 POSTERIOR LUMBAR INTERBODY FUSION ,DECOMPRESSION LUMBAR FOUR,L5  Action/Plan: Discharge disposition pending as member had surgery 06/10/15 and PT eval is pending. CM will monitor for discharge planning needs  Expected Discharge Date:   (Pending)               Expected Discharge Plan:  [place]  In-House Referral:     Discharge planning Services  CM Consult  Post Acute Care Choice:    Choice offered to:     DME Arranged:    DME Agency:     HH Arranged:    HH Agency:     Status of Service:  In process, will continue to follow  Medicare Important Message Given:    Date Medicare IM Given:    Medicare IM give by:    Date Additional Medicare IM Given:    Additional Medicare Important Message give by:     If discussed at [place] of Stay Meetings, dates discussed:    Additional Comments:  Guido Sander, RN 06/10/2015, 8:36 PM

## 2015-06-10 NOTE — Transfer of Care (Signed)
Immediate Anesthesia Transfer of Care Note  Patient: Megan [name]  Procedure(s) Performed: Procedure(s): POSTERIOR LUMBAR INTERBODY FUSION ,DECOMPRESSION LUMBAR FOUR,LUMBAR FIVEL (N/A)  Patient Location: PACU  Anesthesia Type:General  Level of Consciousness: awake, oriented and patient cooperative  Airway & Oxygen Therapy: Patient Spontanous Breathing and Patient connected to face mask oxygen  Post-op Assessment: Report given to RN and Post -op Vital signs reviewed and stable  Post vital signs: Reviewed  Last Vitals:  Filed Vitals:   06/10/15 1118  BP: 148/70  Pulse:   Temp: 36.4 C  Resp:     Complications: No apparent anesthesia complications

## 2015-06-10 NOTE — Anesthesia Postprocedure Evaluation (Signed)
Anesthesia Post Note  Patient: Megan [name]  Procedure(s) Performed: Procedure(s) (LRB): POSTERIOR LUMBAR INTERBODY FUSION ,DECOMPRESSION LUMBAR FOUR,LUMBAR FIVEL (N/A)  Anesthesia type: general  Patient location: PACU  Post pain: Pain level controlled  Post assessment: Patient's Cardiovascular Status Stable  Last Vitals:  Filed Vitals:   06/10/15 1357  BP: 144/61  Pulse: 83  Temp: 36.7 C  Resp: 17    Post vital signs: Reviewed and stable  Level of consciousness: sedated  Complications: No apparent anesthesia complications

## 2015-06-10 NOTE — Progress Notes (Addendum)
ANTIBIOTIC CONSULT NOTE - INITIAL  Pharmacy Consult for vancomycin  Indication: surgical prophylaxis  Allergies  Allergen Reactions  . Shellfish Allergy Swelling and Other (See Comments)    Tongue swells  . Oxycodone Nausea Only  . Penicillins Rash    Patient Measurements: Height: '5\' 8"'$  (172.7 cm) Weight: 175 lb (79.379 kg) IBW/kg (Calculated) : 63.9  Vital Signs: Temp: 98.1 F (36.7 C) (09/27 1357) Temp Source: Oral (09/27 1357) BP: 144/61 mmHg (09/27 1357) Pulse Rate: 83 (09/27 1357) Intake/Output from previous day:   Intake/Output from this shift: Total I/O In: 2000 [I.V.:2000] Out: 650 [Urine:425; Drains:25; Blood:200]  Labs: No results for input(s): WBC, HGB, PLT, LABCREA, CREATININE in the last 72 hours. Estimated Creatinine Clearance: 69.4 mL/min (by C-G formula based on Cr of 0.93). No results for input(s): VANCOTROUGH, VANCOPEAK, VANCORANDOM, GENTTROUGH, GENTPEAK, GENTRANDOM, TOBRATROUGH, TOBRAPEAK, TOBRARND, AMIKACINPEAK, AMIKACINTROU, AMIKACIN in the last 72 hours.   Microbiology: Recent Results (from the past 720 hour(s))  Surgical pcr screen     Status: None   Collection Time: 05/30/15 12:27 PM  Result Value Ref Range Status   MRSA, PCR NEGATIVE NEGATIVE Final   Staphylococcus aureus NEGATIVE NEGATIVE Final    Comment:        The Xpert SA Assay (FDA approved for NASAL specimens in patients over 34 years of age), is one component of a comprehensive surveillance program.  Test performance has been validated by [facility] for patients greater than or equal to 35 year old. It is not intended to diagnose infection nor to guide or monitor treatment.     Medical History: Past Medical History  Diagnosis Date  . PONV (postoperative nausea and vomiting)   . Pneumonia     2008  . Neuromuscular disorder     tingling toes  . Diabetes mellitus without complication     borderline  . Arthritis   . Bladder infection     history  . Hypertension      not on meds 06/02/15    Medications:  Scheduled:  .  ceFAZolin (ANCEF) IV  1 g Intravenous Q8H  . HYDROmorphone      . HYDROmorphone      . morphine   Intravenous 6 times per day  . morphine      . ondansetron      . sodium chloride  3 mL Intravenous Q12H  . vancomycin        Assessment: 64 yo female s/p spinal surgery and pharmacy consulted to dose vancomycin for surgical prophylaxis. SCr 0.93 on 9/16 and CrCl ~ 70.  Only one dose post op is needed for surgical prophylaxis. Ancef was also ordered for post-op prophylaxis (x2 doses). Since vancomycin given pre-op (noted PCN allergy w/ rash) will continue with vancomycin only post-op.  -vancomycin '1000mg'$  IV given at ~ 8am today  Goal of Therapy:  Vancomycin trough level 10-15 mcg/ml  Plan:  -Will give vancomycin '1000mg'$   x1 at 8pm tonight -Will sign off. Please contact pharmacy with any other needs  Thank you, Hildred Laser, Pharm D 06/10/2015 2:23 PM

## 2015-06-11 LAB — GLUCOSE, CAPILLARY
GLUCOSE-CAPILLARY: 148 mg/dL — AB (ref 65–99)
GLUCOSE-CAPILLARY: 157 mg/dL — AB (ref 65–99)
GLUCOSE-CAPILLARY: 153 mg/dL — AB (ref 65–99)
Glucose-Capillary: 158 mg/dL — ABNORMAL HIGH (ref 65–99)

## 2015-06-11 MED ORDER — TRAMADOL HCL 50 MG PO TABS
100.0000 mg | ORAL_TABLET | Freq: Four times a day (QID) | ORAL | Status: DC
  Administered 2015-06-11 – 2015-06-17 (×26): 100 mg via ORAL
  Filled 2015-06-11 (×26): qty 2

## 2015-06-11 MED FILL — Heparin Sodium (Porcine) Inj 1000 Unit/ML: INTRAMUSCULAR | Qty: 30 | Status: AC

## 2015-06-11 MED FILL — Sodium Chloride IV Soln 0.9%: INTRAVENOUS | Qty: 1000 | Status: AC

## 2015-06-11 NOTE — Progress Notes (Signed)
Occupational Therapy Evaluation Patient Details Name: ADALEI NOVELL MRN: [id number] DOB: [birth year] Today's Date: 06/11/2015    History of Present Illness pt presents with L4-5 PLIF and hx of Cervical Fusion, HTN, and DM.     Clinical Impression   Pt admitted with the above diagnoses and presents with below problem list. Pt will benefit from continued acute OT to address the below listed deficits and maximize independence with BADLs prior to home. PTA pt was independent with ADLs. Pt is currently setup to min A with ADLs. Spouse assisting at home. ADL education provided. OT to continue to follow acutely.      Follow Up Recommendations  Supervision - Intermittent;Other (comment);No OT follow up (OOB/mobility)    Equipment Recommendations  None recommended by OT    Recommendations for Other Services       Precautions / Restrictions Precautions Precautions: Back Precaution Booklet Issued: No Precaution Comments: Reviewed back precautions Required Braces or Orthoses: Spinal Brace Spinal Brace: Lumbar corset Restrictions Weight Bearing Restrictions: No      Mobility Bed Mobility Overal bed mobility: Needs Assistance Bed Mobility: Sidelying to Sit;Rolling;Sit to Sidelying Rolling: Supervision Sidelying to sit: Supervision     Sit to sidelying: Supervision General bed mobility comments: cues for bringing feet off bed prior to power up  Transfers Overall transfer level: Needs assistance Equipment used: Rolling walker (2 wheeled) Transfers: Sit to/from Stand Sit to Stand: Min guard Stand pivot transfers: Min guard       General transfer comment: cues for technique    Balance Overall balance assessment: No apparent balance deficits (not formally assessed) Sitting-balance support: No upper extremity supported;Feet supported Sitting balance-Leahy Scale: Good     Standing balance support: No upper extremity supported;During functional activity Standing  balance-Leahy Scale: Fair                              ADL Overall ADL's : Needs assistance/impaired Eating/Feeding: Set up;Sitting   Grooming: Min guard;Standing;Cueing for compensatory techniques   Upper Body Bathing: Set up;Sitting   Lower Body Bathing: Min guard;With adaptive equipment;Sit to/from stand   Upper Body Dressing : Set up;Sitting   Lower Body Dressing: Min guard;Sit to/from stand;With adaptive equipment   Toilet Transfer: Min guard;Ambulation;RW;BSC   Toileting- Water quality scientist and Hygiene: Min guard;Minimal assistance;Sit to/from stand Toileting - Clothing Manipulation Details (indicate cue type and reason): educated on AE Tub/ Shower Transfer: Tub transfer;Minimal assistance;Ambulation;Rolling walker   Functional mobility during ADLs: Min guard General ADL Comments: Pt limited by nausea EOB. Pt completed bed mobility and functional mobility at side of bed as detailed above. ADL education provided. Reviewed precautions.      Vision     Perception     Praxis      Pertinent Vitals/Pain Pain Assessment: 0-10 Pain Score: 2  Pain Location: back  Pain Descriptors / Indicators: Aching Pain Intervention(s): Limited activity within patient's tolerance;Monitored during session;Repositioned     Hand Dominance Right   Extremity/Trunk Assessment Upper Extremity Assessment Upper Extremity Assessment: Overall WFL for tasks assessed   Lower Extremity Assessment Lower Extremity Assessment: Defer to PT evaluation   Cervical / Trunk Assessment Cervical / Trunk Assessment: Normal   Communication Communication Communication: No difficulties   Cognition Arousal/Alertness: Awake/alert Behavior During Therapy: WFL for tasks assessed/performed Overall Cognitive Status: Within Functional Limits for tasks assessed  General Comments       Exercises       Shoulder Instructions      Home Living Family/patient  expects to be discharged to:: Private residence Living Arrangements: Spouse/significant other Available Help at Discharge: Family;Available 24 hours/day Type of Home: House Home Access: Stairs to enter [employer] of Steps: 1 Entrance Stairs-Rails: None Home Layout: One level     Bathroom Shower/Tub: Tub/shower unit;Door   [employer] Toilet: Standard Bathroom Accessibility: No   Home Equipment: Clinical cytogeneticist - 2 wheels;Cane - single point;Walker - 4 wheels          Prior Functioning/Environment Level of Independence: Independent             OT Diagnosis: Acute pain   OT Problem List: Impaired balance (sitting and/or standing);Decreased knowledge of use of DME or AE;Decreased knowledge of precautions;Pain   OT Treatment/Interventions: Self-care/ADL training;DME and/or AE instruction;Therapeutic activities;Patient/family education;Balance training    OT Goals(Current goals can be found in the care plan section) Acute Rehab OT Goals Patient Stated Goal: Walk normal OT Goal Formulation: With patient/family Time For Goal Achievement: 06/18/15 Potential to Achieve Goals: Good ADL Goals Pt Will Perform Grooming: with modified independence;standing Pt Will Perform Lower Body Bathing: with modified independence;with adaptive equipment;sit to/from stand Pt Will Perform Lower Body Dressing: with modified independence;with adaptive equipment;sit to/from stand Pt Will Transfer to Toilet: with modified independence;ambulating;bedside commode Pt Will Perform Toileting - Clothing Manipulation and hygiene: with modified independence;with adaptive equipment;sitting/lateral leans;sit to/from stand Pt Will Perform Tub/Shower Transfer: Tub transfer;ambulating;with supervision;rolling walker  OT Frequency: Min 2X/week   Barriers to D/C:            Co-evaluation              End of Session Equipment Utilized During Treatment: Back brace;Oxygen  Activity  Tolerance: Other (comment) (nausea when EOB/OOB) Patient left: in bed;with call bell/phone within reach;with family/visitor present;with SCD's reapplied   Time: 2297-9892 OT Time Calculation (min): 15 min Charges:  OT General Charges $OT Visit: 1 Procedure OT Evaluation $Initial OT Evaluation Tier I: 1 Procedure G-Codes:    [name] 06/18/15, 12:57 PM

## 2015-06-11 NOTE — Progress Notes (Signed)
OT Cancellation Note  Patient Details Name: SULMA RUFFINO MRN: [id number] DOB: [birth year]   Cancelled Treatment:    Reason Eval/Treat Not Completed: Other (comment) (Pt with back brace order. Awaiting back brace delivery. ) Will reattempt OT evaluation once pt has back brace.   [name] 06/11/2015, 9:08 AM

## 2015-06-11 NOTE — Progress Notes (Signed)
Inpatient Diabetes Program Recommendations  AACE/ADA: New Consensus Statement on Inpatient Glycemic Control (2015)  Target Ranges:  Prepandial:   less than 140 mg/dL      Peak postprandial:   less than 180 mg/dL (1-2 hours)      Critically ill patients:  140 - 180 mg/dL   Review of Glycemic Control  Diabetes history: none Current orders for Inpatient glycemic control: Novolog Moderate TID + HS  Inpatient Diabetes Program Recommendations: HgbA1C: A1c is 6.8% on 05/30/15. This meets ADA criteria for new diagnosis of DM. Patient needs to follow up with PCP and lifestyle modification education.  Thanks,  Tama Headings RN, MSN, [facility] Inpatient Diabetes Coordinator Team Pager [phone] (8a-5p)

## 2015-06-11 NOTE — Progress Notes (Signed)
Pt tolerated removal of foley without difficult 350 ml of clear yellow urine. Received verbal from MD that pt can get up out of bed. Awaiting brace delivery. Pt stable, neuro intact no noted distress. Rated pain 6/10 with repositioning. Pt stated that PCA has relieved pain. Minimal amount of old drainage from surgical site dressing dry and intact. Will continue to monitor.

## 2015-06-11 NOTE — Op Note (Signed)
Megan [name], Megan [name]              ACCOUNT NO.:  [id number]  MEDICAL RECORD NO.:  [id number]  LOCATION:  5C10C                        FACILITY:  [facility]  PHYSICIAN:  Leeroy Cha, M.D.   DATE OF BIRTH:  [birth year]  DATE OF PROCEDURE:  06/10/2015 DATE OF DISCHARGE:  06/17/2015                              OPERATIVE REPORT   PREOPERATIVE DIAGNOSIS:  L4-L5 stenosis with spondylolisthesis with radiculopathy.  POSTOPERATIVE DIAGNOSIS:  L4-L5 stenosis with spondylolisthesis with radiculopathy.  PROCEDURE:  L4 Gill procedure, which involved removal of spinous process, lamina, and facet of L4.  Bilateral L4-L5 diskectomy, more than normal to be able to introduce 2 cages for fusion, pedicle screws at L4- L5.  Posterolateral arthrodesis of L4-L5 with autograft and BMP.  SURGEON:  Leeroy Cha, M.D.  ASSISTANT:  Dr. Kathyrn Sheriff.  CLINICAL HISTORY:  The patient had been seen in my office complaining of back pain with radiation to the right leg associated with weakness.  X- ray showed that she has degenerative disc disease, but the myelogram showed that she has stenosis at the level of L4-L5 with grade 1 spondylolisthesis.  Surgery was advised.  The patient knew of the risk and benefits of the surgery.  DESCRIPTION OF PROCEDURE:  The patient was taken to the OR, and after intubation, a Foley catheter was introduced in the bladder.  Then, she was positioned in a prone manner and the back was cleaned with Betadine and DuraPrep.  Drapes were applied.  Midline incision from L4-L5 was made, and muscles were retracted all the way laterally.  X-rays were obtained, which showed 1 clip possibly of L4 spinous process.  From then on, using the Leksell, we removed the spinous process of L4 and the lamina.  The patient had quite a bit of thick and overgrown facet joint, which was removed.  We found that indeed she had quite a bit of stenosis with calcification of the yellow ligament.  Decompression  of the thecal sac was done as well as decompression of the both L4 and L5 nerve root. The patient had quite a bit of adhesion, and we entered the disk space first on the left side and then the right side doing total gross diskectomy, medial and laterally.  We introduced 2 cages which were expanded to 13.  The cages had BMP and autograft inside.  The rest of the disk space was filled up with material of BMP and autograft.  Having good decompression, then using the C-arm in AP and lateral view, we localized the pedicle of L4 and L5.  Holes were made, and prior to introducing screws, we felt all 4 quadrants just to be sure that we were surrounded by bone.  Four screws of 40 mm x 5.5 were introduced, kept in place with rod and caps.  From then on, the lateral aspect of the facet and ultimate transverse process of L4-L5 were drilled to remove the periosteum.  A mix of BMP and autograft was used for arthrodesis.  The area was irrigated.  The Valsalva maneuver was negative.  Vancomycin powder was left in the operative site.  Medium Hemovac also was left, and the wound was closed with different layer of Vicryl  and Steri-Strips.          ______________________________ Leeroy Cha, M.D.     EB/MEDQ  D:  06/10/2015  T:  06/11/2015  Job:  [id number]

## 2015-06-11 NOTE — Op Note (Deleted)
[name], Megan [name]              ACCOUNT NO.:  [id number]  MEDICAL RECORD NO.:  [id number]  LOCATION:  5C10C                        FACILITY:  [facility]  PHYSICIAN:  Leeroy Cha, M.D.   DATE OF BIRTH:  10/18/1950  DATE OF PROCEDURE:  06/10/2015 DATE OF DISCHARGE:  06/17/2015                              OPERATIVE REPORT   PREOPERATIVE DIAGNOSIS:  L4-L5 stenosis with spondylolisthesis with radiculopathy.  POSTOPERATIVE DIAGNOSIS:  L4-L5 stenosis with spondylolisthesis with radiculopathy.  PROCEDURE:  L4 Gill procedure, which involved removal of spinous process, lamina, and facet of L4.  Bilateral L4-L5 diskectomy, more than normal to be able to introduce 2 cages for fusion, pedicle screws at L4- L5.  Posterolateral arthrodesis of L4-L5 with autograft and BMP.  SURGEON:  Leeroy Cha, M.D.  ASSISTANT:  Dr. Kathyrn Sheriff.  CLINICAL HISTORY:  The patient had been seen in my office complaining of back pain with radiation to the right leg associated with weakness.  X- ray showed that she has degenerative disc disease, but the myelogram showed that she has stenosis at the level of L4-L5 with grade 1 spondylolisthesis.  Surgery was advised.  The patient knew of the risk and benefits of the surgery.  DESCRIPTION OF PROCEDURE:  The patient was taken to the OR, and after intubation, a Foley catheter was introduced in the bladder.  Then, she was positioned in a prone manner and the back was cleaned with Betadine and DuraPrep.  Drapes were applied.  Midline incision from L4-L5 was made, and muscles were retracted all the way laterally.  X-rays were obtained, which showed 1 clip possibly of L4 spinous process.  From then on, using the Leksell, we removed the spinous process of L4 and the lamina.  The patient had quite a bit of thick and overgrown facet joint, which was removed.  We found that indeed she had quite a bit of stenosis with calcification of the yellow ligament.  Decompression  of the thecal sac was done as well as decompression of the both L4 and L5 nerve root. The patient had quite a bit of adhesion, and we entered the disk space first on the left side and then the right side doing total gross diskectomy, medial and laterally.  We introduced 2 cages which were expanded to 13.  The cages had BMP and autograft inside.  The rest of the disk space was filled up with material of BMP and autograft.  Having good decompression, then using the C-arm in AP and lateral view, we localized the pedicle of L4 and L5.  Holes were made, and prior to introducing screws, we felt all 4 quadrants just to be sure that we were surrounded by bone.  Four screws of 40 mm x 5.5 were introduced, kept in place with rod and caps.  From then on, the lateral aspect of the facet and ultimate transverse process of L4-L5 were drilled to remove the periosteum.  A mix of BMP and autograft was used for arthrodesis.  The area was irrigated.  The Valsalva maneuver was negative.  Vancomycin powder was left in the operative site.  Medium Hemovac also was left, and the wound was closed with different layer of Vicryl  and Steri-Strips.          ______________________________ Leeroy Cha, M.D.     EB/MEDQ  D:  06/10/2015  T:  06/11/2015  Job:  [id number]

## 2015-06-11 NOTE — Progress Notes (Signed)
Hemovac removed, Pt tolerated. Pain medication administered prior to removal. Dry dressing applied to site. Surgical dressing changed, clean, dry and intact. Will continue to monitor.

## 2015-06-11 NOTE — Clinical Social Work Note (Signed)
CSW consult acknowledged:  Clinical Social Worker received consult in reference to SNF placement. PT/OT currently recommending "No PT/OT follow up".  Clinical Social Worker will sign off for now as social work intervention is no longer needed. Please consult [place] again if new need arises.  Glendon Axe, MSW, LCSWA [phone] 06/11/2015 4:05 PM

## 2015-06-11 NOTE — Evaluation (Signed)
Physical Therapy Evaluation Patient Details Name: Megan [name] MRN: [id number] DOB: 1951-08-30 Today's Date: 06/11/2015   History of Present Illness  pt presents with L4-5 PLIF and hx of Cervical Fusion, HTN, and DM.    Clinical Impression  Pt very eager for OOB, but nervous about pain.  Pt used PCA and then became nauseated when transferring to chair.  Pt attempted to sit in chair, but nausea was not subsiding and pt returned to bed.  Feel when nausea is resolved pt will progress well with mobility.  Will continue to follow.      Follow Up Recommendations No PT follow up;Supervision - Intermittent    Equipment Recommendations  None recommended by PT    Recommendations for Other Services       Precautions / Restrictions Precautions Precautions: Back Precaution Booklet Issued: No Precaution Comments: Reviewed back precautions Required Braces or Orthoses: Spinal Brace Spinal Brace: Lumbar corset (No brace present, but MD Ok for mobility without brace.) Restrictions Weight Bearing Restrictions: No      Mobility  Bed Mobility Overal bed mobility: Needs Assistance Bed Mobility: Sidelying to Sit;Rolling;Sit to Sidelying Rolling: Supervision Sidelying to sit: Supervision     Sit to sidelying: Supervision General bed mobility comments: cues for log roll technique, but no physical A needed.    Transfers Overall transfer level: Needs assistance Equipment used: Rolling walker (2 wheeled) Transfers: Sit to/from [employer] Sit to Stand: Min guard Stand pivot transfers: Min guard       General transfer comment: cues for UE use and controlling descent to sitting.    Ambulation/Gait                Stairs            Wheelchair Mobility    Modified Rankin (Stroke Patients Only)       Balance Overall balance assessment: No apparent balance deficits (not formally assessed)                                            Pertinent Vitals/Pain Pain Assessment: 0-10 Pain Score: 3  Pain Location: Back Pain Descriptors / Indicators: Aching Pain Intervention(s): Monitored during session;Repositioned    Home Living Family/patient expects to be discharged to:: Private residence Living Arrangements: Spouse/significant other Available Help at Discharge: Family;Available 24 hours/day Type of Home: House Home Access: Stairs to enter Entrance Stairs-Rails: None Entrance Stairs-Number of Steps: 1 Home Layout: One level Home Equipment: Clinical cytogeneticist - 2 wheels;Cane - single point;Walker - 4 wheels      Prior Function Level of Independence: Independent               Hand Dominance   Dominant Hand: Right    Extremity/Trunk Assessment   Upper Extremity Assessment: Defer to OT evaluation           Lower Extremity Assessment: Overall WFL for tasks assessed      Cervical / Trunk Assessment: Normal  Communication   Communication: No difficulties  Cognition Arousal/Alertness: Awake/alert Behavior During Therapy: WFL for tasks assessed/performed Overall Cognitive Status: Within Functional Limits for tasks assessed                      General Comments      Exercises        Assessment/Plan    PT Assessment Patient needs continued PT  services  PT Diagnosis Difficulty walking   PT Problem List Decreased activity tolerance;Decreased balance;Decreased mobility;Decreased knowledge of use of DME;Decreased knowledge of precautions  PT Treatment Interventions DME instruction;Gait training;Stair training;Functional mobility training;Therapeutic activities;Therapeutic exercise;Balance training;Neuromuscular re-education;Patient/family education   PT Goals (Current goals can be found in the Care Plan section) Acute Rehab PT Goals Patient Stated Goal: Walk normal PT Goal Formulation: With patient Time For Goal Achievement: 06/18/15 Potential to Achieve Goals: Good     Frequency Min 5X/week   Barriers to discharge        Co-evaluation               End of Session   Activity Tolerance:  (Limited by Nausea.) Patient left: in bed;with call bell/phone within reach;with family/visitor present Nurse Communication: Mobility status         Time: 1025-1050 PT Time Calculation (min) (ACUTE ONLY): 25 min   Charges:   PT Evaluation $Initial PT Evaluation Tier I: 1 Procedure PT Treatments $Therapeutic Activity: 8-22 mins   PT G CodesCatarina Hartshorn, [place] 06/11/2015, 11:01 AM

## 2015-06-11 NOTE — Progress Notes (Signed)
Patient ID: Megan [name], female   DOB: [birth year], 64 y.o.   MRN: [id number] Stable, no weakness. Minimal drainage. Plan oob, dc foley

## 2015-06-12 LAB — GLUCOSE, CAPILLARY
GLUCOSE-CAPILLARY: 193 mg/dL — AB (ref 65–99)
GLUCOSE-CAPILLARY: 181 mg/dL — AB (ref 65–99)
Glucose-Capillary: 155 mg/dL — ABNORMAL HIGH (ref 65–99)

## 2015-06-12 NOTE — Progress Notes (Signed)
Physical Therapy Treatment Patient Details Name: Megan [name] MRN: [id number] DOB: Nov 30, 1950 Today's Date: 06/12/2015    History of Present Illness pt presents with L4-5 PLIF and hx of Cervical Fusion, HTN, and DM.      PT Comments    Pt moves slowly and requires increased A today.  Pt indicates lightheadedness in standing.  Orthostatic BP checked and found to be 144/73 supine, 139/61 in sitting, and 138/63 in standing.  Encouraged pt to try to sit up more often to help with lightheaded feeling.  Will continue to follow.    Follow Up Recommendations  No PT follow up;Supervision - Intermittent     Equipment Recommendations  None recommended by PT    Recommendations for Other Services       Precautions / Restrictions Precautions Precautions: Back Precaution Comments: Reviewed back precautions Required Braces or Orthoses: Spinal Brace Spinal Brace: Lumbar corset (No brace present, but MD Ok for mobility without brace.)) Restrictions Weight Bearing Restrictions: No    Mobility  Bed Mobility Overal bed mobility: Needs Assistance Bed Mobility: Rolling;Sidelying to Sit;Sit to Sidelying Rolling: Supervision Sidelying to sit: Supervision     Sit to sidelying: Supervision General bed mobility comments: S for safety and cues for log roll.    Transfers Overall transfer level: Needs assistance Equipment used: Rolling walker (2 wheeled) Transfers: Sit to/from Stand Sit to Stand: Min assist         General transfer comment: pt indicated no nausea or lightheadedness.  pt indicates her LE pain is making it more difficult to come to stand today.    Ambulation/Gait Ambulation/Gait assistance: Min assist Ambulation Distance (Feet): 5 Feet (forward and backward) Assistive device: Rolling walker (2 wheeled) Gait Pattern/deviations: Step-through pattern;Decreased stride length     General Gait Details: pt moves slowly and indicates feeling like she would pass out.  pt  returned to bed.    Stairs            Wheelchair Mobility    Modified Rankin (Stroke Patients Only)       Balance Overall balance assessment: Needs assistance Sitting-balance support: No upper extremity supported;Feet supported Sitting balance-Leahy Scale: Good     Standing balance support: Bilateral upper extremity supported;During functional activity Standing balance-Leahy Scale: Fair                      Cognition Arousal/Alertness: Awake/alert Behavior During Therapy: WFL for tasks assessed/performed Overall Cognitive Status: Within Functional Limits for tasks assessed                      Exercises      General Comments        Pertinent Vitals/Pain Pain Assessment: 0-10 Pain Score: 6  Pain Location: Back and Posterior of LEs Pain Descriptors / Indicators: Sore Pain Intervention(s): Monitored during session;Premedicated before session;Repositioned    Home Living                      Prior Function            PT Goals (current goals can now be found in the care plan section) Acute Rehab PT Goals Patient Stated Goal: Walk normal PT Goal Formulation: With patient Time For Goal Achievement: 06/18/15 Potential to Achieve Goals: Good Progress towards PT goals: Progressing toward goals    Frequency  Min 5X/week    PT Plan Current plan remains appropriate    Co-evaluation  End of Session   Activity Tolerance:  (Limited by feeling lightheaded) Patient left: in bed;with call bell/phone within reach;with family/visitor present     Time: 9191-6606 PT Time Calculation (min) (ACUTE ONLY): 21 min  Charges:  $Therapeutic Activity: 8-22 mins                    G CodesCatarina Hartshorn, [place] [phone] 06/12/2015, 4:02 PM

## 2015-06-12 NOTE — Progress Notes (Signed)
Occupational Therapy Treatment Patient Details Name: Megan [name] MRN: [id number] DOB: [birth year] Today's Date: 06/12/2015    History of present illness pt presents with L4-5 PLIF and hx of Cervical Fusion, HTN, and DM.     OT comments  Pt limited this session by intense nausea once EOB. Planned to practice toilet transfers with pt declining once EOB citing nausea. Reviewed ADL education and provided handout to pt and spouse. D/c plan remains appropriate although equipment recommendation has been updated to reflect need for 3n1. OT to continue to follow acutely.     Follow Up Recommendations  Supervision - Intermittent;Other (comment);No OT follow up    Equipment Recommendations  3 in 1 bedside comode    Recommendations for Other Services      Precautions / Restrictions Precautions Precautions: Back Precaution Booklet Issued: Yes (comment) Precaution Comments: Reviewed back precautions Required Braces or Orthoses: Spinal Brace Spinal Brace: Lumbar corset (No brace present, but MD Ok for mobility without brace.)) Restrictions Weight Bearing Restrictions: No       Mobility Bed Mobility Overal bed mobility: Needs Assistance Bed Mobility: Sidelying to Sit;Rolling;Sit to Sidelying Rolling: Supervision Sidelying to sit: Supervision     Sit to sidelying: Supervision General bed mobility comments: Supervision for safety  Transfers                 General transfer comment: Pt declined due to increased nausea sitting EOB.    Balance Overall balance assessment: Needs assistance Sitting-balance support: Bilateral upper extremity supported;Feet supported Sitting balance-Leahy Scale: Good Sitting balance - Comments: limited by nausea                           ADL Overall ADL's : Needs assistance/impaired                                       General ADL Comments: Pt continues to be limited by nausea with movement. Goal of session was  toilet transfer with pt motivated to participate but unable to complete due to nausea once EOB. Pt completed bed mobility as detailed below. Reviewed ADL and precaution education with spouse present. Provided handout.       Vision                     Perception     Praxis      Cognition   Behavior During Therapy: WFL for tasks assessed/performed Overall Cognitive Status: Within Functional Limits for tasks assessed                       Extremity/Trunk Assessment               Exercises     Shoulder Instructions       General Comments      Pertinent Vitals/ Pain       Pain Assessment: 0-10 Pain Score:  ("It's not bad when I'm here laying down.") Pain Location: back Pain Descriptors / Indicators: Aching Pain Intervention(s): Limited activity within patient's tolerance;Monitored during session;Repositioned  Home Living                                          Prior Functioning/Environment  Frequency Min 2X/week     Progress Toward Goals  OT Goals(current goals can now be found in the care plan section)  Progress towards OT goals: Progressing toward goals  Acute Rehab OT Goals Patient Stated Goal: Walk normal OT Goal Formulation: With patient/family Time For Goal Achievement: 06/18/15 Potential to Achieve Goals: Good ADL Goals Pt Will Perform Grooming: with modified independence;standing Pt Will Perform Lower Body Bathing: with modified independence;with adaptive equipment;sit to/from stand Pt Will Perform Lower Body Dressing: with modified independence;with adaptive equipment;sit to/from stand Pt Will Transfer to Toilet: with modified independence;ambulating;bedside commode Pt Will Perform Toileting - Clothing Manipulation and hygiene: with modified independence;with adaptive equipment;sitting/lateral leans;sit to/from stand Pt Will Perform Tub/Shower Transfer: Tub transfer;ambulating;with  supervision;rolling walker  Plan Discharge plan remains appropriate    Co-evaluation                 End of Session Equipment Utilized During Treatment: Oxygen   Activity Tolerance Other (comment) (nausea when EOB)   Patient Left in bed;with call bell/phone within reach;with family/visitor present;with SCD's reapplied   Nurse Communication Other (comment) (Pt request nausea med)        Time: 7681-1572 OT Time Calculation (min): 13 min  Charges: OT General Charges $OT Visit: 1 Procedure OT Treatments $Self Care/Home Management : 8-22 mins  [name] 06/12/2015, 1:01 PM

## 2015-06-12 NOTE — Progress Notes (Signed)
Patient ID: Megan [name], female   DOB: 03-Mar-1951, 64 y.o.   MRN: [id number] C/o incisional pain. No brace in her room. Company called. No weaknes

## 2015-06-13 LAB — URINALYSIS, ROUTINE W REFLEX MICROSCOPIC
Bilirubin Urine: NEGATIVE
GLUCOSE, UA: 100 mg/dL — AB
KETONES UR: 15 mg/dL — AB
Nitrite: NEGATIVE
PH: 6.5 (ref 5.0–8.0)
Protein, ur: NEGATIVE mg/dL
SPECIFIC GRAVITY, URINE: 1.016 (ref 1.005–1.030)
Urobilinogen, UA: 0.2 mg/dL (ref 0.0–1.0)

## 2015-06-13 LAB — GLUCOSE, CAPILLARY
GLUCOSE-CAPILLARY: 187 mg/dL — AB (ref 65–99)
GLUCOSE-CAPILLARY: 178 mg/dL — AB (ref 65–99)
GLUCOSE-CAPILLARY: 186 mg/dL — AB (ref 65–99)
Glucose-Capillary: 164 mg/dL — ABNORMAL HIGH (ref 65–99)
Glucose-Capillary: 180 mg/dL — ABNORMAL HIGH (ref 65–99)

## 2015-06-13 LAB — URINE MICROSCOPIC-ADD ON

## 2015-06-13 MED ORDER — MORPHINE SULFATE (PF) 2 MG/ML IV SOLN
2.0000 mg | INTRAVENOUS | Status: DC | PRN
  Administered 2015-06-13 – 2015-06-15 (×4): 2 mg via INTRAVENOUS
  Filled 2015-06-13 (×4): qty 1

## 2015-06-13 NOTE — Progress Notes (Signed)
Patient ID: Megan [name], female   DOB: 1951/05/25, 64 y.o.   MRN: [id number] Still; no lumbar brace given to her. Mu office will bring one. Less pain. Still with nausea.

## 2015-06-13 NOTE — Progress Notes (Signed)
Physical Therapy Treatment Patient Details Name: Megan [name] MRN: [id number] DOB: [birth year] Today's Date: 06/13/2015    History of Present Illness pt presents with L4-5 PLIF and hx of Cervical Fusion, HTN, and DM.      PT Comments    Pt needs max encouragement for mobility continuing to c/o feeling lightheaded.  Strong pt ed on needing to get OOB to sit in chair and actually walking into bathroom instead of bedside commode.  Pt able to ambulate without physical A and pt was set-up to bathe herself at end of session.  Spoke with RN about encouraging more OOB time and mobility with pt.    Follow Up Recommendations  No PT follow up;Supervision - Intermittent     Equipment Recommendations  None recommended by PT    Recommendations for Other Services       Precautions / Restrictions Precautions Precautions: Back Precaution Comments: Reviewed back precautions Required Braces or Orthoses: Spinal Brace Spinal Brace: Lumbar corset;Applied in sitting position Restrictions Weight Bearing Restrictions: No    Mobility  Bed Mobility Overal bed mobility: Modified Independent Bed Mobility: Rolling;Sidelying to Sit Rolling: Supervision Sidelying to sit: Supervision       General bed mobility comments: S for safety and cues for log roll.    Transfers Overall transfer level: Needs assistance Equipment used: Rolling walker (2 wheeled) Transfers: Sit to/from Stand Sit to Stand: Min guard         General transfer comment: cues for UE use and Max encouragement.  pt will start to stand and then sit back down 2-3 times before coming to stand due to LEs hurting.    Ambulation/Gait Ambulation/Gait assistance: Min guard Ambulation Distance (Feet): 60 Feet Assistive device: Rolling walker (2 wheeled) Gait Pattern/deviations: Step-through pattern;Decreased stride length;Shuffle     General Gait Details: Max encouragement for ambulation with pt continuing to c/o of feeling  lightheaded.  While in hallway pt indicates needing to use bathroom and no longer c/o lightheadedness while ambulating back to room and bathroom.     Stairs            Wheelchair Mobility    Modified Rankin (Stroke Patients Only)       Balance Overall balance assessment: Needs assistance Sitting-balance support: No upper extremity supported;Feet supported Sitting balance-Leahy Scale: Good     Standing balance support: Single extremity supported;During functional activity Standing balance-Leahy Scale: Fair Standing balance comment: pt able to stand and perform peri hygiene with single UE support after using toilet.                      Cognition Arousal/Alertness: Awake/alert Behavior During Therapy: WFL for tasks assessed/performed Overall Cognitive Status: Within Functional Limits for tasks assessed                      Exercises      General Comments        Pertinent Vitals/Pain Pain Assessment: 0-10 Pain Score: 6  Pain Location: Back and posterior of LEs Pain Descriptors / Indicators: Aching;Tightness Pain Intervention(s): Monitored during session;Premedicated before session;Repositioned    Home Living                      Prior Function            PT Goals (current goals can now be found in the care plan section) Acute Rehab PT Goals Patient Stated Goal: Walk normal PT Goal Formulation: With  patient Time For Goal Achievement: 06/18/15 Potential to Achieve Goals: Good Progress towards PT goals: Progressing toward goals    Frequency  Min 5X/week    PT Plan Current plan remains appropriate    Co-evaluation             End of Session Equipment Utilized During Treatment: Gait belt;Back brace Activity Tolerance: Patient tolerated treatment well Patient left: in chair;with call bell/phone within reach     Time: 1323-1351 PT Time Calculation (min) (ACUTE ONLY): 28 min  Charges:  $Gait Training: 8-22  mins $Therapeutic Activity: 8-22 mins                    G CodesCatarina Hartshorn, [place] 06/13/2015, 3:00 PM

## 2015-06-13 NOTE — Progress Notes (Signed)
Occupational Therapy Treatment Patient Details Name: Megan [name] MRN: [id number] DOB: 01/09/1951 Today's Date: 06/13/2015    History of present illness pt presents with L4-5 PLIF and hx of Cervical Fusion, HTN, and DM.     OT comments  Making slow progress. Requires Max encouragement to participate. Educated husband /pt on ADL and use of DME/AE and compensatory techniques. Will plan to see in am and focus on educating pt/husband on tub transfers with use of DME. Pt to sit up for dinner tonight and walk with staff after dinner.   Follow Up Recommendations  Supervision - Intermittent;Other (comment);No OT follow up    Equipment Recommendations  3 in 1 bedside comode    Recommendations for Other Services      Precautions / Restrictions Precautions Precautions: Back Precaution Comments: Reviewed back precautions Required Braces or Orthoses: Spinal Brace Spinal Brace: Lumbar corset;Applied in sitting position Restrictions Weight Bearing Restrictions: No       Mobility Bed Mobility Overal bed mobility: Modified Independent Bed Mobility: Sit to Sidelying;Rolling Rolling: Modified independent (Device/Increase time) Sidelying to sit: Modified independent (Device/Increase time)         Transfers Overall transfer level: Needs assistance Equipment used: Rolling walker (2 wheeled) Transfers: Sit to/from Stand Sit to Stand: Min guard Stand pivot transfers: Min guard       General transfer comment: cues for UE use and Max encouragement.  pt will start to stand and then sit back down 2-3 times before coming to stand due to LEs hurting.  Pt pushed bottom back in the chair and stated "i can not do it"    Balance Overall balance assessment: Needs assistance Sitting-balance support: No upper extremity supported;Feet supported Sitting balance-Leahy Scale: Good     Standing balance support: Single extremity supported;During functional activity Standing balance-Leahy Scale:  Fair                    ADL                                       Functional mobility during ADLs: Min guard General ADL Comments: Requires max encouragement to participate. Educated pt/family on back precautions and ADL. Educated pt on donning/doffing back brace. Educated on bed positioning in sidelying to decrease pain and allow for positioning of ice on back. Discussed home set up and recommendation to use RW bag and have reacher with her at all times when husband not at home. discussed removing throw rugs to reduce risk of falls. discussed importance of ambulation and need to get up tonight for dinner and walk with staff.      Vision                     Perception     Praxis      Cognition   Behavior During Therapy: Flat affect Overall Cognitive Status: Within Functional Limits for tasks assessed                       Extremity/Trunk Assessment               Exercises     Shoulder Instructions       General Comments      Pertinent Vitals/ Pain       Pain Assessment: 0-10 Pain Score: 5  Pain Location: back Pain Descriptors / Indicators: Aching Pain  Intervention(s): Limited activity within patient's tolerance;Monitored during session;Repositioned;Ice applied  Home Living                                          Prior Functioning/Environment              Frequency Min 2X/week     Progress Toward Goals  OT Goals(current goals can now be found in the care plan section)  Progress towards OT goals: Progressing toward goals  Acute Rehab OT Goals Patient Stated Goal: Walk normal OT Goal Formulation: With patient/family Time For Goal Achievement: 06/18/15 Potential to Achieve Goals: Good ADL Goals Pt Will Perform Grooming: with modified independence;standing Pt Will Perform Lower Body Bathing: with modified independence;with adaptive equipment;sit to/from stand Pt Will Perform Lower Body  Dressing: with modified independence;with adaptive equipment;sit to/from stand Pt Will Transfer to Toilet: with modified independence;ambulating;bedside commode Pt Will Perform Toileting - Clothing Manipulation and hygiene: with modified independence;with adaptive equipment;sitting/lateral leans;sit to/from stand Pt Will Perform Tub/Shower Transfer: Tub transfer;ambulating;with supervision;rolling walker  Plan Discharge plan remains appropriate    Co-evaluation                 End of Session Equipment Utilized During Treatment: Gait belt;Rolling walker;Back brace   Activity Tolerance Patient tolerated treatment well   Patient Left in bed;with call bell/phone within reach;with family/visitor present   Nurse Communication Mobility status        Time: 1430-1455 OT Time Calculation (min): 25 min  Charges: OT Treatments $Self Care/Home Management : 23-37 mins  WARD,HILLARY 06/13/2015, 3:08 PM   [facility], OTR/L  [phone] 06/13/2015

## 2015-06-14 LAB — GLUCOSE, CAPILLARY
GLUCOSE-CAPILLARY: 179 mg/dL — AB (ref 65–99)
Glucose-Capillary: 170 mg/dL — ABNORMAL HIGH (ref 65–99)
Glucose-Capillary: 152 mg/dL — ABNORMAL HIGH (ref 65–99)
Glucose-Capillary: 173 mg/dL — ABNORMAL HIGH (ref 65–99)

## 2015-06-14 MED ORDER — FLEET ENEMA 7-19 GM/118ML RE ENEM: 1.0000 | ENEMA | Freq: Every day | RECTAL | Status: DC | PRN

## 2015-06-14 MED ORDER — INFLUENZA VAC SPLIT QUAD 0.5 ML IM SUSY
0.5000 mL | PREFILLED_SYRINGE | INTRAMUSCULAR | Status: DC
  Filled 2015-06-14: qty 0.5

## 2015-06-14 MED ORDER — SULFAMETHOXAZOLE-TRIMETHOPRIM 800-160 MG PO TABS
1.0000 | ORAL_TABLET | Freq: Two times a day (BID) | ORAL | Status: DC
  Administered 2015-06-14 – 2015-06-15 (×4): 1 via ORAL
  Filled 2015-06-14 (×5): qty 1

## 2015-06-14 NOTE — Progress Notes (Signed)
Physical Therapy Treatment Patient Details Name: Megan [name] MRN: [id number] DOB: 03-Mar-1951 Today's Date: 06/14/2015    History of Present Illness pt presents with L4-5 PLIF and hx of Cervical Fusion, HTN, and DM.      PT Comments    Patient continues to required Max encouragement to progress with therapy. She stated that she had just had Valium and was very sleepy. Agreeable to walk with some encouragement. Did not practices steps but able to verbalize with patient and her husband. She deferred practiced due to feeling tired and wanting to sleep.   Follow Up Recommendations  No PT follow up;Supervision - Intermittent     Equipment Recommendations  None recommended by PT    Recommendations for Other Services       Precautions / Restrictions Precautions Precautions: Back Precaution Comments: Reviewed back precautions. Cues for no twisting.  Required Braces or Orthoses: Spinal Brace Spinal Brace: Lumbar corset;Applied in sitting position Restrictions Weight Bearing Restrictions: No    Mobility  Bed Mobility     Rolling: Modified independent (Device/Increase time) Sidelying to sit: Modified independent (Device/Increase time)          Transfers Overall transfer level: Needs assistance Equipment used: Rolling walker (2 wheeled)   Sit to Stand: Min guard         General transfer comment: Patient with safe technique. Patient 'catches" 'with stand and takes a second to come fully upright  Ambulation/Gait Ambulation/Gait assistance: Min guard Ambulation Distance (Feet): 75 Feet Assistive device: Rolling walker (2 wheeled) Gait Pattern/deviations: Step-through pattern;Decreased stride length;Shuffle     General Gait Details: Cues for upright posutre and ecnouragement to increase ambulation. Patient limited by "being tired from meds"   Stairs            Wheelchair Mobility    Modified Rankin (Stroke Patients Only)       Balance                                     Cognition Arousal/Alertness: Awake/alert Behavior During Therapy: Flat affect Overall Cognitive Status: Within Functional Limits for tasks assessed                      Exercises      General Comments        Pertinent Vitals/Pain Pain Score: 5  Pain Location: back Pain Descriptors / Indicators: Aching;Sore Pain Intervention(s): Monitored during session;Premedicated before session    Home Living                      Prior Function            PT Goals (current goals can now be found in the care plan section) Progress towards PT goals: Progressing toward goals    Frequency  Min 5X/week    PT Plan Current plan remains appropriate    Co-evaluation             End of Session Equipment Utilized During Treatment: Back brace Activity Tolerance: Patient tolerated treatment well Patient left: in bed;with call bell/phone within reach     Time: 0923-0940 PT Time Calculation (min) (ACUTE ONLY): 17 min  Charges:  $Gait Training: 8-22 mins                    G Codes:      Jacqualyn Posey 06/14/2015, 9:44 AM  06/14/2015 Robinette, Tonia Brooms PTA

## 2015-06-14 NOTE — Progress Notes (Signed)
OT Cancellation Note  Patient Details Name: Megan [name] MRN: [id number] DOB: 09-08-1951   Cancelled Treatment:    Reason Eval/Treat Not Completed: Fatigue/lethargy limiting ability to participate.  Declines skilled OT in am secondary to just returning to bed after PT with pain meds given.  Pt. Groggy and unable to keep eyes open.  Discussed with pt.s husband OT would return to review/practice tub transfer later today or in am.  Janice Coffin, COTA/L 06/14/2015, 10:00 AM

## 2015-06-14 NOTE — Progress Notes (Signed)
Patient ID: Megan [name], female   DOB: 1951-06-14, 64 y.o.   MRN: [id number] Urine culture pending,most likely uti. C/o incisional pain, no weakness

## 2015-06-15 LAB — GLUCOSE, CAPILLARY
GLUCOSE-CAPILLARY: 185 mg/dL — AB (ref 65–99)
Glucose-Capillary: 155 mg/dL — ABNORMAL HIGH (ref 65–99)
Glucose-Capillary: 163 mg/dL — ABNORMAL HIGH (ref 65–99)
Glucose-Capillary: 160 mg/dL — ABNORMAL HIGH (ref 65–99)

## 2015-06-15 MED ORDER — MAGNESIUM HYDROXIDE 400 MG/5ML PO SUSP
15.0000 mL | Freq: Every day | ORAL | Status: DC | PRN
  Administered 2015-06-15: 15 mL via ORAL
  Filled 2015-06-15: qty 30

## 2015-06-15 MED ORDER — BISACODYL 10 MG RE SUPP
10.0000 mg | Freq: Every day | RECTAL | Status: DC | PRN
  Administered 2015-06-15: 10 mg via RECTAL
  Filled 2015-06-15: qty 1

## 2015-06-15 NOTE — Progress Notes (Signed)
Occupational Therapy Treatment Patient Details Name: Megan [name] MRN: [id number] DOB: 1950/11/26 Today's Date: 06/15/2015    History of present illness pt presents with L4-5 PLIF and hx of Cervical Fusion, HTN, and DM.     OT comments  Husband has set up home for safety with discharge and purchased AE from gift shop.  Pt showered today with OT seated on 3 in 1.  3/10 pain reported after showering.  Follow Up Recommendations  No OT follow up;Supervision - Intermittent    Equipment Recommendations  3 in 1 bedside comode    Recommendations for Other Services      Precautions / Restrictions Precautions Precautions: Back;Fall Precaution Comments: pt cued to avoid bending to wash feet Required Braces or Orthoses: Spinal Brace Spinal Brace: Lumbar corset;Applied in sitting position       Mobility Bed Mobility Overal bed mobility: Needs Assistance           Sit to sidelying: Min guard General bed mobility comments: S for safety and cues for log roll.    Transfers   Equipment used: Rolling walker (2 wheeled) Transfers: Sit to/from Stand Sit to Stand: Min guard         General transfer comment: min guard from recliner, supervision for 3 in 1    Balance     Sitting balance-Leahy Scale: Good       Standing balance-Leahy Scale: Fair                     ADL Overall ADL's : Needs assistance/impaired     Grooming: Brushing hair;Sitting;Wash/dry hands;Wash/dry face;Set up   Upper Body Bathing: Minimal assitance;Sitting Upper Body Bathing Details (indicate cue type and reason): assist to dry back Lower Body Bathing: Minimal assistance;Sitting/lateral leans Lower Body Bathing Details (indicate cue type and reason): washed pt's feed in absence of a bath sponge Upper Body Dressing : Set up;Sitting     Lower Body Dressing Details (indicate cue type and reason): assisted with socks         Tub/ Shower Transfer: Walk-in shower;Min guard;Ambulation;3 in  1   Functional mobility during ADLs: Min guard General ADL Comments: Pt needing minimal encouragement to shower. Husband has installed a grab bar at the tub and a shelf for pt's personal care items at home.  Daughter with supervise showering.      Vision                     Perception     Praxis      Cognition   Behavior During Therapy: WFL for tasks assessed/performed Overall Cognitive Status: Within Functional Limits for tasks assessed                       Extremity/Trunk Assessment               Exercises     Shoulder Instructions       General Comments      Pertinent Vitals/ Pain       Pain Assessment: 0-10 Pain Score: 3  Pain Location: back Pain Descriptors / Indicators: Sore Pain Intervention(s): Limited activity within patient's tolerance;Premedicated before session;Monitored during session  Home Living                                          Prior Functioning/Environment  Frequency Min 2X/week     Progress Toward Goals  OT Goals(current goals can now be found in the care plan section)  Progress towards OT goals: Progressing toward goals  Acute Rehab OT Goals Patient Stated Goal: Walk normal  Plan Discharge plan remains appropriate    Co-evaluation                 End of Session Equipment Utilized During Treatment: Gait belt;Rolling walker;Back brace   Activity Tolerance Patient tolerated treatment well   Patient Left in bed;with call bell/phone within reach;with family/visitor present   Nurse Communication  (ok to shower pt)        Time: 1400-1450 OT Time Calculation (min): 50 min  Charges: OT General Charges $OT Visit: 1 Procedure OT Treatments $Self Care/Home Management : 38-52 mins  [name] 06/15/2015, 3:33 PM  [phone]

## 2015-06-15 NOTE — Progress Notes (Signed)
Patient progressing slowly. Still with low-grade dull of back pain and some lower extremity symptoms. Not really participating with therapy at present.  Afebrile. Vitals are stable. Awake and alert. Motor and sensory  function are intact. Wound clean and dry. Abdomen soft.  Progressing slowly. Continue efforts at mobilization.

## 2015-06-15 NOTE — Progress Notes (Signed)
Patient ID: Megan [name], female   DOB: 12/17/1950, 64 y.o.   MRN: [id number] Stable. C/o abdominal distention,flatus positive as well as bowel sounds. To get a suppository

## 2015-06-16 LAB — GLUCOSE, CAPILLARY
Glucose-Capillary: 153 mg/dL — ABNORMAL HIGH (ref 65–99)
Glucose-Capillary: 175 mg/dL — ABNORMAL HIGH (ref 65–99)
Glucose-Capillary: 156 mg/dL — ABNORMAL HIGH (ref 65–99)

## 2015-06-16 LAB — URINE CULTURE

## 2015-06-16 MED ORDER — CIPROFLOXACIN HCL 500 MG PO TABS
500.0000 mg | ORAL_TABLET | Freq: Two times a day (BID) | ORAL | Status: DC
  Administered 2015-06-16 – 2015-06-17 (×3): 500 mg via ORAL
  Filled 2015-06-16 (×3): qty 1

## 2015-06-16 MED ORDER — SULFAMETHOXAZOLE-TRIMETHOPRIM 800-160 MG PO TABS: 1.0000 | ORAL_TABLET | Freq: Two times a day (BID) | ORAL | Status: DC

## 2015-06-16 MED ORDER — FLEET ENEMA 7-19 GM/118ML RE ENEM
1.0000 | ENEMA | Freq: Once | RECTAL | Status: AC
  Administered 2015-06-16: 1 via RECTAL
  Filled 2015-06-16: qty 1

## 2015-06-16 NOTE — Progress Notes (Signed)
Patient ID: Megan [name], female   DOB: 05-06-1951, 64 y.o.   MRN: [id number] C/o increase urinary frequency, no bm. bs present

## 2015-06-16 NOTE — Progress Notes (Signed)
Physical Therapy Treatment Patient Details Name: Megan [name] MRN: [id number] DOB: May 09, 1951 Today's Date: 06/16/2015    History of Present Illness pt presents with L4-5 PLIF and hx of Cervical Fusion, HTN, and DM.      PT Comments    Pt continues to need encouragement for mobility and increasing ambulation distance.  Pt able to perform a step today and ambulate 100' in the hallway.  Daughter present for session and educated on importance of mobility at home and overall home safety.  At this point pt is ready for D/C from PT stand point.    Follow Up Recommendations  Home health PT;Supervision - Intermittent     Equipment Recommendations  None recommended by PT    Recommendations for Other Services       Precautions / Restrictions Precautions Precautions: Back;Fall Precaution Comments: pt verbalized 3/3 back precautions and followed with only minimal cueing.   Required Braces or Orthoses: Spinal Brace Spinal Brace: Lumbar corset;Applied in sitting position Restrictions Weight Bearing Restrictions: No    Mobility  Bed Mobility Overal bed mobility: Needs Assistance Bed Mobility: Rolling;Sidelying to Sit Rolling: Modified independent (Device/Increase time) Sidelying to sit: Supervision       General bed mobility comments: pt demonstrates improved ability to complete log roll and no cues needed this session.    Transfers Overall transfer level: Needs assistance Equipment used: Rolling walker (2 wheeled) Transfers: Sit to/from Stand Sit to Stand: Min guard         General transfer comment: cues for UE use as pt tends to reach for RW.  pt able to come to stand from 3-in-1 over toilet with only S.    Ambulation/Gait Ambulation/Gait assistance: Min guard Ambulation Distance (Feet): 100 Feet Assistive device: Rolling walker (2 wheeled) Gait Pattern/deviations: Step-through pattern;Decreased stride length     General Gait Details: Continued max cueing for  encouragement for ambulation and to increase ambulation distance.  cues for positioning within RW.     Stairs Stairs: Yes Stairs assistance: Min guard Stair Management: One rail Right;Step to pattern;Forwards Number of Stairs: 1 General stair comments: pt able to perform single step without any physical A only guarding.    Wheelchair Mobility    Modified Rankin (Stroke Patients Only)       Balance Overall balance assessment: Needs assistance Sitting-balance support: No upper extremity supported;Feet supported Sitting balance-Leahy Scale: Good     Standing balance support: No upper extremity supported;During functional activity Standing balance-Leahy Scale: Good Standing balance comment: pt able to stand and adjust gown without any UE support.                      Cognition Arousal/Alertness: Awake/alert Behavior During Therapy: WFL for tasks assessed/performed Overall Cognitive Status: Within Functional Limits for tasks assessed                      Exercises      General Comments        Pertinent Vitals/Pain Pain Assessment: 0-10 Pain Score: 2  Pain Location: Back Pain Descriptors / Indicators: Sore Pain Intervention(s): Monitored during session;Premedicated before session;Repositioned    Home Living                      Prior Function            PT Goals (current goals can now be found in the care plan section) Acute Rehab PT Goals Patient Stated Goal:  Walk normal PT Goal Formulation: With patient Time For Goal Achievement: 06/18/15 Potential to Achieve Goals: Good Progress towards PT goals: Progressing toward goals    Frequency  Min 5X/week    PT Plan Current plan remains appropriate    Co-evaluation             End of Session Equipment Utilized During Treatment: Back brace Activity Tolerance: Patient tolerated treatment well Patient left: in chair;with call bell/phone within reach;with family/visitor present      Time: 5056-9794 PT Time Calculation (min) (ACUTE ONLY): 31 min  Charges:  $Gait Training: 8-22 mins $Therapeutic Activity: 8-22 mins                    G CodesCatarina Hartshorn, Kimberly 06/16/2015, 10:25 AM

## 2015-06-16 NOTE — Progress Notes (Signed)
Occupational Therapy Treatment Patient Details Name: Megan [name] MRN: [id number] DOB: 02/12/1951 Today's Date: 06/16/2015    History of present illness pt presents with L4-5 PLIF and hx of Cervical Fusion, HTN, and DM.     OT comments  Pt progressing towards acute OT goals. Focus of session was toilet transfers, toilet hygiene and clothing management. Daughter present for session and included in education. Pt reporting dizziness during session. Min guard for transfers and functional mobility; min A for pericare. D/c plan remains appropriate.   Follow Up Recommendations  No OT follow up;Supervision - Intermittent    Equipment Recommendations  3 in 1 bedside comode    Recommendations for Other Services      Precautions / Restrictions Precautions Precautions: Back;Fall Precaution Comments: pt verbalized 3/3 back precautions and followed with only minimal cueing.   Required Braces or Orthoses: Spinal Brace Spinal Brace: Lumbar corset;Applied in sitting position Restrictions Weight Bearing Restrictions: No       Mobility Bed Mobility               General bed mobility comments: on BSC upon therapist arrival; recliner at end of session  Transfers Overall transfer level: Needs assistance Equipment used: Rolling walker (2 wheeled) Transfers: Sit to/from Stand Sit to Stand: Min guard         General transfer comment: cues for hand placement; from [facility] 2x    Balance Overall balance assessment: Needs assistance         Standing balance support: Bilateral upper extremity supported;During functional activity Standing balance-Leahy Scale: Fair Standing balance comment: assist to complete pericare in standing                   ADL Overall ADL's : Needs assistance/impaired                         Toilet Transfer: Min guard;Ambulation;RW;BSC   Toileting- Clothing Manipulation and Hygiene: Minimal assistance;Sit to/from stand Toileting - Clothing  Manipulation Details (indicate cue type and reason): Assist to complete posterior pericare with pt in standing position. Pt able to complete anterior pericare in sitting/lateral leans without physical assist. Reviewed AE for pericare at home.      Functional mobility during ADLs: Min guard;Rolling walker General ADL Comments: Pt completed toilet transfer and pericare as detailed above. Pt reports n/v after eating earlier today and some dizziness during OT session.       Vision                     Perception     Praxis      Cognition   Behavior During Therapy: WFL for tasks assessed/performed Overall Cognitive Status: Within Functional Limits for tasks assessed                       Extremity/Trunk Assessment               Exercises     Shoulder Instructions       General Comments      Pertinent Vitals/ Pain       Pain Assessment: 0-10 Pain Score: 3  Pain Location: back Pain Descriptors / Indicators: Sore Pain Intervention(s): Limited activity within patient's tolerance;Monitored during session;Repositioned  Home Living  Prior Functioning/Environment              Frequency Min 2X/week     Progress Toward Goals  OT Goals(current goals can now be found in the care plan section)  Progress towards OT goals: Progressing toward goals  Acute Rehab OT Goals Patient Stated Goal: Walk normal OT Goal Formulation: With patient/family Time For Goal Achievement: 06/18/15 Potential to Achieve Goals: Good ADL Goals Pt Will Perform Grooming: with modified independence;standing Pt Will Perform Lower Body Bathing: with modified independence;with adaptive equipment;sit to/from stand Pt Will Perform Lower Body Dressing: with modified independence;with adaptive equipment;sit to/from stand Pt Will Transfer to Toilet: with modified independence;ambulating;bedside commode Pt Will Perform Toileting  - Clothing Manipulation and hygiene: with modified independence;with adaptive equipment;sitting/lateral leans;sit to/from stand Pt Will Perform Tub/Shower Transfer: Tub transfer;ambulating;with supervision;rolling walker  Plan Discharge plan remains appropriate    Co-evaluation                 End of Session Equipment Utilized During Treatment: Rolling walker;Back brace   Activity Tolerance Patient tolerated treatment well   Patient Left in chair;with call bell/phone within reach;with family/visitor present   Nurse Communication          Time: 7253-6644 OT Time Calculation (min): 25 min  Charges: OT General Charges $OT Visit: 1 Procedure OT Treatments $Self Care/Home Management : 23-37 mins  [name] 06/16/2015, 1:42 PM

## 2015-06-16 NOTE — Progress Notes (Signed)
Patient request to take cipro for UTI instead of bactrim. Will collect urine sample before giving cipro per doctor request.

## 2015-06-17 LAB — GLUCOSE, CAPILLARY
GLUCOSE-CAPILLARY: 150 mg/dL — AB (ref 65–99)
Glucose-Capillary: 143 mg/dL — ABNORMAL HIGH (ref 65–99)

## 2015-06-17 NOTE — Progress Notes (Signed)
Physical Therapy Treatment Patient Details Name: Megan [name] MRN: [id number] DOB: [birth year] Today's Date: 06/17/2015    History of Present Illness pt presents with L4-5 PLIF and hx of Cervical Fusion, HTN, and DM.      PT Comments    Pt demonstrating good safety with mobility, but continues to need encouragement for increasing ambulation.  Feel pt would benefit from HHPT to continue to progress mobility at home.    Follow Up Recommendations  Home health PT;Supervision - Intermittent     Equipment Recommendations  None recommended by PT    Recommendations for Other Services       Precautions / Restrictions Precautions Precautions: Back;Fall Precaution Comments: pt verbalized 3/3 back precautions and followed with only minimal cueing.   Required Braces or Orthoses: Spinal Brace Spinal Brace: Lumbar corset;Applied in sitting position Restrictions Weight Bearing Restrictions: No    Mobility  Bed Mobility                  Transfers Overall transfer level: Needs assistance Equipment used: Rolling walker (2 wheeled) Transfers: Sit to/from Stand Sit to Stand: Supervision         General transfer comment: pt with better technique on transfers today and did not require cueing.    Ambulation/Gait Ambulation/Gait assistance: Supervision Ambulation Distance (Feet): 120 Feet Assistive device: Rolling walker (2 wheeled) Gait Pattern/deviations: Step-through pattern;Decreased stride length     General Gait Details: pt continues to move slowly and requires cues for encouragement.     Stairs            Wheelchair Mobility    Modified Rankin (Stroke Patients Only)       Balance Overall balance assessment: Needs assistance Sitting-balance support: No upper extremity supported;Feet supported Sitting balance-Leahy Scale: Good     Standing balance support: No upper extremity supported;During functional activity Standing balance-Leahy Scale:  Good Standing balance comment: pt able to stand for peri hygiene and then washed hands at sink without UE support.                      Cognition Arousal/Alertness: Awake/alert Behavior During Therapy: WFL for tasks assessed/performed Overall Cognitive Status: Within Functional Limits for tasks assessed                      Exercises      General Comments        Pertinent Vitals/Pain Pain Assessment: 0-10 Pain Score: 3  Pain Location: Back Pain Descriptors / Indicators: Sore Pain Intervention(s): Monitored during session;Premedicated before session;Repositioned    Home Living                      Prior Function            PT Goals (current goals can now be found in the care plan section) Acute Rehab PT Goals Patient Stated Goal: Walk normal PT Goal Formulation: With patient Time For Goal Achievement: 06/18/15 Potential to Achieve Goals: Good Progress towards PT goals: Progressing toward goals    Frequency  Min 5X/week    PT Plan Current plan remains appropriate    Co-evaluation             End of Session Equipment Utilized During Treatment: Back brace Activity Tolerance: Patient tolerated treatment well Patient left: in bed;with call bell/phone within reach;with family/visitor present     Time: 8101-7510 PT Time Calculation (min) (ACUTE ONLY): 18 min  Charges:  $  Gait Training: 8-22 mins                    G CodesCatarina [name], Megan [name] 06/17/2015, 11:40 AM

## 2015-06-17 NOTE — Progress Notes (Signed)
Patient discharging home. Patient refuses home health at this time. Discharge instructions reviewed with patient and husband, patient verbalized understanding. IV removed, patient tolerated well. Vitals stable. Patient assisted off the unit by wheelchair.

## 2015-06-17 NOTE — Care Management Note (Signed)
Case Management Note  Patient Details  Name: Megan [name] MRN: [id number] Date of Birth: 05-05-1951  Subjective/Objective:                    Action/Plan: Patient being discharged today. PT recommending HHPT. CM spoke with the patient at the bedside and she does not want HHPT. Patient is requesting 3 in 1. Orders placed for DME. Jermaine with [facility] notified and equipment to be delivered to the room. Bedside RN and family aware.   Expected Discharge Date:   (Pending)               Expected Discharge Plan:  [place]  In-House Referral:     Discharge planning Services  CM Consult  Post Acute Care Choice:    Choice offered to:     DME Arranged:  3-N-1 DME Agency:  [place]:   (none--patient refused) [place] Agency:     Status of Service:  Completed, signed off  Medicare Important Message Given:    Date Medicare IM Given:    Medicare IM give by:    Date Additional Medicare IM Given:    Additional Medicare Important Message give by:     If discussed at [place] of Stay Meetings, dates discussed:    Additional Comments:  Pollie Friar, RN 06/17/2015, 11:46 AM

## 2015-06-17 NOTE — Discharge Summary (Signed)
Physician Discharge Summary  Patient ID: Megan [name] MRN: [id number] DOB/AGE: May 14, 1951 64 y.o.  Admit date: 06/10/2015 Discharge date: 06/17/2015  Admission Diagnoses:lumbar spondylolisthesis  Discharge Diagnoses:  Active Problems:   Spondylolisthesis of lumbar region   Discharged Condition: ambulating  Hospital Course: surgery  Consults: none  Significant Diagnostic Studies: mri  Treatments: lumbar fusion  Discharge Exam: Blood pressure 162/73, pulse 80, temperature 98.1 F (36.7 C), temperature source Oral, resp. rate 20, height '5\' 8"'$  (1.727 m), weight 79.379 kg (175 lb), SpO2 99 %. Ambulating. No weqakness  Disposition:  home    Medication List    ASK your doctor about these medications        ibuprofen 200 MG tablet  Commonly known as:  ADVIL,MOTRIN  Take 400-600 mg by mouth every 6 (six) hours as needed for moderate pain.         Signed: Floyce Stakes 06/17/2015, 8:57 AM

## 2015-06-18 LAB — URINE CULTURE

## 2015-08-28 NOTE — Final Progress Note (Signed)
Operative note was dictated on May 30, 2015 at 11.59 pm

## 2016-03-23 ENCOUNTER — Other Ambulatory Visit: Payer: Self-pay | Admitting: Neurosurgery

## 2016-03-23 DIAGNOSIS — M4316 Spondylolisthesis, lumbar region: Secondary | ICD-10-CM

## 2016-03-29 ENCOUNTER — Other Ambulatory Visit: Payer: BLUE CROSS/BLUE SHIELD

## 2016-04-01 ENCOUNTER — Ambulatory Visit
Admission: RE | Admit: 2016-04-01 | Discharge: 2016-04-01 | Disposition: A | Discharge status: Home / Self Care | Payer: BLUE CROSS/BLUE SHIELD | Source: Ambulatory Visit | Attending: Neurosurgery | Admitting: Neurosurgery

## 2016-04-01 DIAGNOSIS — M4316 Spondylolisthesis, lumbar region: Secondary | ICD-10-CM

## 2016-04-01 MED ORDER — IOPAMIDOL (ISOVUE-M 200) INJECTION 41%
15.0000 mL | Freq: Once | INTRAMUSCULAR | Status: AC
Start: 2016-04-01 — End: 2016-04-01
  Administered 2016-04-01: 15 mL via INTRATHECAL

## 2016-04-01 MED ORDER — DIAZEPAM 5 MG PO TABS
10.0000 mg | ORAL_TABLET | Freq: Once | ORAL | Status: AC
  Administered 2016-04-01: 5 mg via ORAL

## 2016-04-01 NOTE — Progress Notes (Signed)
Pt states she has not had Tramadol for at least the last 2 days.  Discharge instructions explained to pt.

## 2016-04-01 NOTE — Discharge Instructions (Signed)
Myelogram Discharge Instructions  1. Go home and rest quietly for the next 24 hours.  It is important to lie flat for the next 24 hours.  Get up only to go to the restroom.  You may lie in the bed or on a couch on your back, your stomach, your left side or your right side.  You may have one pillow under your head.  You may have pillows between your knees while you are on your side or under your knees while you are on your back.  2. DO NOT drive today.  Recline the seat as far back as it will go, while still wearing your seat belt, on the way home.  3. You may get up to go to the bathroom as needed.  You may sit up for 10 minutes to eat.  You may resume your normal diet and medications unless otherwise indicated.  Drink lots of extra fluids today and tomorrow.  4. The incidence of headache, nausea, or vomiting is about 5% (one in 20 patients).  If you develop a headache, lie flat and drink plenty of fluids until the headache goes away.  Caffeinated beverages may be helpful.  If you develop severe nausea and vomiting or a headache that does not go away with flat bed rest, call [phone].  5. You may resume normal activities after your 24 hours of bed rest is over; however, do not exert yourself strongly or do any heavy lifting tomorrow. If when you get up you have a headache when standing, go back to bed and force fluids for another 24 hours.  6. Call your physician for a follow-up appointment.  The results of your myelogram will be sent directly to your physician by the following day.  7. If you have any questions or if complications develop after you arrive home, please call [phone].  Discharge instructions have been explained to the patient.  The patient, or the person responsible for the patient, fully understands these instructions.      May resume Tramadol on April 02, 2016, after 1:00 pm.

## 2016-04-09 ENCOUNTER — Other Ambulatory Visit: Payer: Self-pay | Admitting: Neurosurgery

## 2016-04-20 ENCOUNTER — Encounter (HOSPITAL_COMMUNITY): Payer: Self-pay

## 2016-04-20 ENCOUNTER — Encounter (HOSPITAL_COMMUNITY)
Admission: RE | Admit: 2016-04-20 | Discharge: 2016-04-20 | Disposition: A | Discharge status: Home / Self Care | Payer: BLUE CROSS/BLUE SHIELD | Source: Ambulatory Visit | Attending: Neurosurgery | Admitting: Neurosurgery

## 2016-04-20 DIAGNOSIS — Z0183 Encounter for blood typing: Secondary | ICD-10-CM | POA: Insufficient documentation

## 2016-04-20 DIAGNOSIS — Z01812 Encounter for preprocedural laboratory examination: Secondary | ICD-10-CM | POA: Diagnosis present

## 2016-04-20 DIAGNOSIS — M4316 Spondylolisthesis, lumbar region: Secondary | ICD-10-CM | POA: Insufficient documentation

## 2016-04-20 HISTORY — DX: Personal history of other infectious and parasitic diseases: Z86.19

## 2016-04-20 HISTORY — DX: Restless legs syndrome: G25.81

## 2016-04-20 HISTORY — DX: Weakness: R53.1

## 2016-04-20 HISTORY — DX: Personal history of other diseases of the respiratory system: Z87.09

## 2016-04-20 HISTORY — DX: Personal history of urinary calculi: Z87.442

## 2016-04-20 HISTORY — DX: Hyperlipidemia, unspecified: E78.5

## 2016-04-20 LAB — TYPE AND SCREEN
ABO/RH(D): A POS
Antibody Screen: NEGATIVE

## 2016-04-20 LAB — BASIC METABOLIC PANEL
Anion gap: 10 (ref 5–15)
BUN: 12 mg/dL (ref 6–20)
CALCIUM: 9.4 mg/dL (ref 8.9–10.3)
CO2: 24 mmol/L (ref 22–32)
CREATININE: 0.84 mg/dL (ref 0.44–1.00)
Chloride: 104 mmol/L (ref 101–111)
GFR calc Af Amer: >60 mL/min (ref >60)
GFR calc non Af Amer: >60 mL/min (ref >60)
Glucose, Bld: 218 mg/dL — ABNORMAL HIGH (ref 65–99)
Potassium: 4.3 mmol/L (ref 3.5–5.1)
Sodium: 138 mmol/L (ref 135–145)

## 2016-04-20 LAB — SURGICAL PCR SCREEN
MRSA, PCR: NEGATIVE
STAPHYLOCOCCUS AUREUS: NEGATIVE

## 2016-04-20 LAB — CBC
HEMATOCRIT: 40 % (ref 36.0–46.0)
HEMOGLOBIN: 13.1 g/dL (ref 12.0–15.0)
MCH: 29.3 pg (ref 26.0–34.0)
MCHC: 32.8 g/dL (ref 30.0–36.0)
MCV: 89.5 fL (ref 78.0–100.0)
Platelets: 234 10*3/uL (ref 150–400)
RBC: 4.47 MIL/uL (ref 3.87–5.11)
RDW: 13.4 % (ref 11.5–15.5)
WBC: 5.2 10*3/uL (ref 4.0–10.5)

## 2016-04-20 LAB — GLUCOSE, CAPILLARY: GLUCOSE-CAPILLARY: 222 mg/dL — AB (ref 65–99)

## 2016-04-20 MED ORDER — CHLORHEXIDINE GLUCONATE CLOTH 2 % EX PADS: 6.0000 | MEDICATED_PAD | Freq: Once | CUTANEOUS | Status: DC

## 2016-04-20 NOTE — Progress Notes (Addendum)
Cardiologist denies  Medical Md with [facility] is NOwell Redmon PA  Echo denies  Stress test denies  Heart cath denies  EKG in epic from 05-30-15  CXR denies

## 2016-04-20 NOTE — Pre-Procedure Instructions (Signed)
[facility]  04/20/2016      [facility] [address] [place], [place] [address] Phone: [phone] Fax: [phone]  [facility] [place], [place] [address] Phone: [phone] Fax: [phone]    Your procedure is scheduled on Tues, Aug 15 @ 8:00 AM  Report to [place] at 5:30 AM  Call this number if you have problems the morning of surgery:  [phone]   Remember:  Do not eat food or drink liquids after midnight.  Take these medicines the morning of surgery with A SIP OF WATER Gabapentin(Neurontin),Allegra(Fexofenadine),& Tramadol(Ultram-if needed)             Stop taking your Aspirin along with Vitamins and Herbal Medications a week prior to surgery. No Goody's,BC's,Aleve,Advil,Motrin,or Ibuprofen.    Do not wear jewelry, make-up or nail polish.  Do not wear lotions, powders, or perfumes.    Do not shave 48 hours prior to surgery.    Do not bring valuables to the hospital.  [facility] is not responsible for any belongings or valuables.  Contacts, dentures or bridgework may not be worn into surgery.  Leave your suitcase in the car.  After surgery it may be brought to your room.  For patients admitted to the hospital, discharge time will be determined by your treatment team.  Patients discharged the day of surgery will not be allowed to drive home.   [facility]  Before surgery, you can play an important role.  Because skin is not sterile, your skin needs to be as free of germs as possible.  You can reduce the number of germs on you skin by washing with CHG (chlorahexidine gluconate) soap before surgery.  CHG is an antiseptic cleaner which kills germs and bonds with the skin to continue killing germs even after  washing.  Please DO NOT use if you have an allergy to CHG or antibacterial soaps.  If your skin becomes reddened/irritated stop using the CHG and inform your nurse when you arrive at Short Stay.  Do not shave (including legs and underarms) for at least 48 hours prior to the first CHG shower.  You may shave your face.  Please follow these instructions carefully:   1.  Shower with CHG Soap the night before surgery and the                                morning of Surgery.  2.  If you choose to wash your hair, wash your hair first as usual with your       normal shampoo.  3.  After you shampoo, rinse your hair and body thoroughly to remove the                      Shampoo.  4.  Use CHG as you would any other liquid soap.  You can apply chg directly       to the skin and wash gently with scrungie or a clean washcloth.  5.  Apply the CHG Soap to your body ONLY FROM THE NECK DOWN.        Do not use on open wounds or open sores.  Avoid contact with your  eyes,       ears, mouth and genitals (private parts).  Wash genitals (private parts)       with your normal soap.  6.  Wash thoroughly, paying special attention to the area where your surgery        will be performed.  7.  Thoroughly rinse your body with warm water from the neck down.  8.  DO NOT shower/wash with your normal soap after using and rinsing off       the CHG Soap.  9.  Pat yourself dry with a clean towel.            10.  Wear clean pajamas.            11.  Place clean sheets on your bed the night of your first shower and do not        sleep with pets.  Day of Surgery  Do not apply any lotions/deoderants the morning of surgery.  Please wear clean clothes to the hospital/surgery center.     Please read over the following fact sheets that you were given. Pain Booklet, Coughing and Deep Breathing, MRSA Information and Surgical Site Infection Prevention

## 2016-04-26 MED ORDER — VANCOMYCIN HCL IN DEXTROSE 1-5 GM/200ML-% IV SOLN
1000.0000 mg | INTRAVENOUS | Status: AC
  Administered 2016-04-27: 1000 mg via INTRAVENOUS
  Filled 2016-04-26: qty 200

## 2016-04-26 NOTE — H&P (Signed)
Megan [name] is an 65 y.o. female.   Chief Complaint: lumbar pain HPI: patient who underwent lumbar fusion at l4-5. Did well but since April of this year after she went back to work she has developed lumbar pain with radiation to the righr thigh with burning sensation and sometimes not able to control the leg. 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 Arthritis   . Borderline diabetic   . History of bronchitis 2005  . History of kidney stones   . History of shingles   . Hyperlipidemia    takes Fish Oil daily  . Neuromuscular disorder (HCC)    tingling toes  . Pneumonia 2009  . PONV (postoperative nausea and vomiting)   . Restless leg   . Weakness    numbness and tingling in both feet r/t back    Past Surgical History:  Procedure Laterality Date  . ABDOMINAL HYSTERECTOMY  1979  . BACK SURGERY  2016   fusion  . blADder tacked   1991  . CERVICAL FUSION     2001  . HERNIA REPAIR  1991  . 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 Social History:  reports that she quit smoking about 9 years ago. Her smoking use included Cigarettes. She smoked 1.50 packs per day. She has never used smokeless tobacco. She reports that she drinks alcohol. She reports that she does not use drugs.  Allergies:  Allergies  Allergen Reactions  . Shellfish Allergy Anaphylaxis, Swelling and Other (See Comments)    Tongue swells  . Oxycodone Nausea Only  . Penicillins Rash    Has patient had a PCN reaction causing immediate rash, facial/tongue/throat swelling, SOB or lightheadedness with hypotension: {no Has patient had a PCN reaction causing severe rash involving mucus membranes or skin necrosis: rash Has patient had a PCN reaction that required hospitalization {no Has patient had a PCN reaction occurring  within the last 10 years: {yes If all of the above answers are "NO", then may proceed with Cephalosporin use.    No prescriptions prior to admission.    No results found for this or any previous visit (from the past 48 hour(s)). No results found.  Review of Systems  Constitutional: Negative.   HENT: Negative.   Eyes: Negative.   Respiratory: Negative.   Cardiovascular: Negative.   Gastrointestinal: Negative.   Genitourinary: Negative.   Musculoskeletal: Positive for back pain.  Skin: Negative.   Neurological: Positive for sensory change and focal weakness.  Endo/Heme/Allergies: Negative.   Psychiatric/Behavioral: Negative.     There were no vitals taken for this visit. Physical Exam hent, nl. Neck, nl. Cv, nl. Lungs, clear. Abdomen, nl. Extremities, nl. NEURO  Sensation noemal but she comlains of burning sensation in the right thigh. Mild weakness proximally in the right leg.   Assessment/Plan Patient to proceed with surgery after no better with conservative treatment. No question that she needs decompression at those two levels with fusion at l2-3 but  Am worried about leaving the space in between with no support and she most likely than not will develop unstability at l34 based on her clinical history so we are going to involve l34 in the fusion. She is aware of risks and benefits  Floyce Stakes, MD 04/26/2016, 5:35 PM

## 2016-04-27 ENCOUNTER — Inpatient Hospital Stay (HOSPITAL_COMMUNITY): Payer: BLUE CROSS/BLUE SHIELD

## 2016-04-27 ENCOUNTER — Inpatient Hospital Stay (HOSPITAL_COMMUNITY): Payer: BLUE CROSS/BLUE SHIELD | Admitting: Critical Care Medicine

## 2016-04-27 ENCOUNTER — Encounter (HOSPITAL_COMMUNITY): Payer: Self-pay | Admitting: Critical Care Medicine

## 2016-04-27 ENCOUNTER — Encounter (HOSPITAL_COMMUNITY)
Admission: RE | Disposition: A | Discharge status: Home / Self Care | Payer: Self-pay | Source: Ambulatory Visit | Attending: Neurosurgery

## 2016-04-27 ENCOUNTER — Inpatient Hospital Stay (HOSPITAL_COMMUNITY)
Admission: RE | Admit: 2016-04-27 | Discharge: 2016-04-29 | DRG: 460 | Disposition: A | Discharge status: Home / Self Care | Payer: BLUE CROSS/BLUE SHIELD | Source: Ambulatory Visit | Attending: Neurosurgery | Admitting: Neurosurgery

## 2016-04-27 DIAGNOSIS — E785 Hyperlipidemia, unspecified: Secondary | ICD-10-CM | POA: Diagnosis present

## 2016-04-27 DIAGNOSIS — M5116 Intervertebral disc disorders with radiculopathy, lumbar region: Secondary | ICD-10-CM | POA: Diagnosis present

## 2016-04-27 DIAGNOSIS — Q762 Congenital spondylolisthesis: Secondary | ICD-10-CM | POA: Diagnosis not present

## 2016-04-27 DIAGNOSIS — Z981 Arthrodesis status: Secondary | ICD-10-CM | POA: Diagnosis not present

## 2016-04-27 DIAGNOSIS — M4806 Spinal stenosis, lumbar region: Secondary | ICD-10-CM | POA: Diagnosis present

## 2016-04-27 DIAGNOSIS — Z87891 Personal history of nicotine dependence: Secondary | ICD-10-CM

## 2016-04-27 DIAGNOSIS — Z419 Encounter for procedure for purposes other than remedying health state, unspecified: Secondary | ICD-10-CM

## 2016-04-27 DIAGNOSIS — M545 Low back pain: Secondary | ICD-10-CM | POA: Diagnosis present

## 2016-04-27 HISTORY — DX: Congenital spondylolisthesis: Q76.2

## 2016-04-27 LAB — GLUCOSE, CAPILLARY
GLUCOSE-CAPILLARY: 213 mg/dL — AB (ref 65–99)
Glucose-Capillary: 170 mg/dL — ABNORMAL HIGH (ref 65–99)
Glucose-Capillary: 240 mg/dL — ABNORMAL HIGH (ref 65–99)

## 2016-04-27 SURGERY — POSTERIOR LUMBAR FUSION 2 LEVEL
Anesthesia: General

## 2016-04-27 MED ORDER — EPHEDRINE SULFATE 50 MG/ML IJ SOLN
INTRAMUSCULAR | Status: DC | PRN
  Administered 2016-04-27 (×3): 5 mg via INTRAVENOUS

## 2016-04-27 MED ORDER — ROCURONIUM BROMIDE 10 MG/ML (PF) SYRINGE
PREFILLED_SYRINGE | INTRAVENOUS | Status: AC
  Filled 2016-04-27: qty 10

## 2016-04-27 MED ORDER — ACETAMINOPHEN 325 MG PO TABS
650.0000 mg | ORAL_TABLET | ORAL | Status: DC | PRN
Start: 2016-04-27 — End: 2016-04-30

## 2016-04-27 MED ORDER — CYCLOBENZAPRINE HCL 10 MG PO TABS
10.0000 mg | ORAL_TABLET | Freq: Three times a day (TID) | ORAL | Status: DC | PRN
  Administered 2016-04-27 – 2016-04-29 (×3): 10 mg via ORAL
  Filled 2016-04-27 (×3): qty 1

## 2016-04-27 MED ORDER — ONDANSETRON HCL 4 MG/2ML IJ SOLN
4.0000 mg | INTRAMUSCULAR | Status: DC | PRN
  Administered 2016-04-28: 4 mg via INTRAVENOUS
  Filled 2016-04-27: qty 2

## 2016-04-27 MED ORDER — SODIUM CHLORIDE 0.9% FLUSH: 3.0000 mL | Freq: Two times a day (BID) | INTRAVENOUS | Status: DC

## 2016-04-27 MED ORDER — MIDAZOLAM HCL 2 MG/2ML IJ SOLN
INTRAMUSCULAR | Status: AC
  Filled 2016-04-27: qty 2

## 2016-04-27 MED ORDER — 0.9 % SODIUM CHLORIDE (POUR BTL) OPTIME
TOPICAL | Status: DC | PRN
  Administered 2016-04-27: 1000 mL

## 2016-04-27 MED ORDER — DIPHENHYDRAMINE HCL 50 MG/ML IJ SOLN
INTRAMUSCULAR | Status: DC | PRN
  Administered 2016-04-27: 10 mg via INTRAVENOUS

## 2016-04-27 MED ORDER — HYDROMORPHONE HCL 1 MG/ML IJ SOLN
0.2500 mg | INTRAMUSCULAR | Status: DC | PRN
  Administered 2016-04-27 (×4): 0.5 mg via INTRAVENOUS

## 2016-04-27 MED ORDER — DEXAMETHASONE SODIUM PHOSPHATE 10 MG/ML IJ SOLN
INTRAMUSCULAR | Status: DC | PRN
  Administered 2016-04-27: 10 mg via INTRAVENOUS

## 2016-04-27 MED ORDER — ASPIRIN 81 MG PO TABS: 81.0000 mg | ORAL_TABLET | Freq: Every day | ORAL | Status: DC

## 2016-04-27 MED ORDER — MENTHOL 3 MG MT LOZG: 1.0000 | LOZENGE | OROMUCOSAL | Status: DC | PRN

## 2016-04-27 MED ORDER — PROPOFOL 10 MG/ML IV BOLUS
INTRAVENOUS | Status: AC
  Filled 2016-04-27: qty 40

## 2016-04-27 MED ORDER — SURGIFOAM 100 EX MISC
CUTANEOUS | Status: DC | PRN
  Administered 2016-04-27: 08:00:00 via TOPICAL

## 2016-04-27 MED ORDER — MEPERIDINE HCL 25 MG/ML IJ SOLN: 6.2500 mg | INTRAMUSCULAR | Status: DC | PRN

## 2016-04-27 MED ORDER — ROCURONIUM BROMIDE 100 MG/10ML IV SOLN
INTRAVENOUS | Status: DC | PRN
  Administered 2016-04-27: 50 mg via INTRAVENOUS
  Administered 2016-04-27: 20 mg via INTRAVENOUS
  Administered 2016-04-27: 10 mg via INTRAVENOUS

## 2016-04-27 MED ORDER — SUGAMMADEX SODIUM 500 MG/5ML IV SOLN
INTRAVENOUS | Status: AC
  Filled 2016-04-27: qty 5

## 2016-04-27 MED ORDER — SUCCINYLCHOLINE CHLORIDE 200 MG/10ML IV SOSY
PREFILLED_SYRINGE | INTRAVENOUS | Status: AC
  Filled 2016-04-27: qty 10

## 2016-04-27 MED ORDER — MORPHINE SULFATE (PF) 2 MG/ML IV SOLN
1.0000 mg | INTRAVENOUS | Status: DC | PRN
  Administered 2016-04-27: 4 mg via INTRAVENOUS
  Filled 2016-04-27: qty 2

## 2016-04-27 MED ORDER — ONDANSETRON HCL 4 MG/2ML IJ SOLN
INTRAMUSCULAR | Status: AC
  Filled 2016-04-27: qty 2

## 2016-04-27 MED ORDER — DEXAMETHASONE SODIUM PHOSPHATE 10 MG/ML IJ SOLN
INTRAMUSCULAR | Status: AC
  Filled 2016-04-27: qty 1

## 2016-04-27 MED ORDER — LIDOCAINE HCL (CARDIAC) 20 MG/ML IV SOLN
INTRAVENOUS | Status: DC | PRN
  Administered 2016-04-27: 20 mg via INTRAVENOUS

## 2016-04-27 MED ORDER — ARTIFICIAL TEARS OP OINT
TOPICAL_OINTMENT | OPHTHALMIC | Status: AC
  Filled 2016-04-27: qty 3.5

## 2016-04-27 MED ORDER — VANCOMYCIN HCL 1000 MG IV SOLR
INTRAVENOUS | Status: DC | PRN
  Administered 2016-04-27: 1000 mg

## 2016-04-27 MED ORDER — VANCOMYCIN HCL 1000 MG IV SOLR
INTRAVENOUS | Status: AC
  Filled 2016-04-27: qty 1000

## 2016-04-27 MED ORDER — SODIUM CHLORIDE 0.9 % IV SOLN: 250.0000 mL | INTRAVENOUS | Status: DC

## 2016-04-27 MED ORDER — HYDROMORPHONE HCL 1 MG/ML IJ SOLN
INTRAMUSCULAR | Status: AC
  Filled 2016-04-27: qty 1

## 2016-04-27 MED ORDER — ONDANSETRON HCL 4 MG/2ML IJ SOLN
INTRAMUSCULAR | Status: DC | PRN
  Administered 2016-04-27: 4 mg via INTRAVENOUS

## 2016-04-27 MED ORDER — PROMETHAZINE HCL 25 MG/ML IJ SOLN: 6.2500 mg | INTRAMUSCULAR | Status: DC | PRN

## 2016-04-27 MED ORDER — DIPHENHYDRAMINE HCL 50 MG/ML IJ SOLN
INTRAMUSCULAR | Status: AC
  Filled 2016-04-27: qty 1

## 2016-04-27 MED ORDER — PHENOL 1.4 % MT LIQD: 1.0000 | OROMUCOSAL | Status: DC | PRN

## 2016-04-27 MED ORDER — ONDANSETRON HCL 4 MG/2ML IJ SOLN: 4.0000 mg | INTRAMUSCULAR | Status: DC | PRN

## 2016-04-27 MED ORDER — VANCOMYCIN HCL 1000 MG IV SOLR: INTRAVENOUS | Status: DC | PRN

## 2016-04-27 MED ORDER — PHENYLEPHRINE 40 MCG/ML (10ML) SYRINGE FOR IV PUSH (FOR BLOOD PRESSURE SUPPORT)
PREFILLED_SYRINGE | INTRAVENOUS | Status: AC
  Filled 2016-04-27: qty 10

## 2016-04-27 MED ORDER — EPHEDRINE SULFATE 50 MG/ML IJ SOLN
INTRAMUSCULAR | Status: AC
  Filled 2016-04-27: qty 1

## 2016-04-27 MED ORDER — LACTATED RINGERS IV SOLN
INTRAVENOUS | Status: DC
Start: 2016-04-27 — End: 2016-04-30
  Administered 2016-04-27 (×3): via INTRAVENOUS

## 2016-04-27 MED ORDER — SCOPOLAMINE 1 MG/3DAYS TD PT72
MEDICATED_PATCH | TRANSDERMAL | Status: AC
  Filled 2016-04-27: qty 1

## 2016-04-27 MED ORDER — ALBUMIN HUMAN 5 % IV SOLN
INTRAVENOUS | Status: DC | PRN
Start: 2016-04-27 — End: 2016-04-27
  Administered 2016-04-27: 11:00:00 via INTRAVENOUS

## 2016-04-27 MED ORDER — SCOPOLAMINE 1 MG/3DAYS TD PT72
MEDICATED_PATCH | TRANSDERMAL | Status: DC | PRN
  Administered 2016-04-27: 1 via TRANSDERMAL

## 2016-04-27 MED ORDER — SODIUM CHLORIDE 0.9% FLUSH: 3.0000 mL | INTRAVENOUS | Status: DC | PRN

## 2016-04-27 MED ORDER — SODIUM CHLORIDE 0.9% FLUSH
3.0000 mL | Freq: Two times a day (BID) | INTRAVENOUS | Status: DC
  Administered 2016-04-27: 3 mL via INTRAVENOUS

## 2016-04-27 MED ORDER — VITAMIN D3 25 MCG (1000 UNIT) PO TABS: 2000.0000 [IU] | ORAL_TABLET | Freq: Every day | ORAL | Status: DC

## 2016-04-27 MED ORDER — LIDOCAINE 2% (20 MG/ML) 5 ML SYRINGE
INTRAMUSCULAR | Status: AC
  Filled 2016-04-27: qty 5

## 2016-04-27 MED ORDER — VANCOMYCIN HCL IN DEXTROSE 1-5 GM/200ML-% IV SOLN
1000.0000 mg | Freq: Two times a day (BID) | INTRAVENOUS | Status: DC
  Administered 2016-04-27 – 2016-04-29 (×4): 1000 mg via INTRAVENOUS
  Filled 2016-04-27 (×4): qty 200

## 2016-04-27 MED ORDER — FENTANYL CITRATE (PF) 250 MCG/5ML IJ SOLN
INTRAMUSCULAR | Status: AC
  Filled 2016-04-27: qty 5

## 2016-04-27 MED ORDER — SUGAMMADEX SODIUM 200 MG/2ML IV SOLN
INTRAVENOUS | Status: DC | PRN
  Administered 2016-04-27: 200 mg via INTRAVENOUS

## 2016-04-27 MED ORDER — PHENYLEPHRINE HCL 10 MG/ML IJ SOLN
INTRAMUSCULAR | Status: DC | PRN
  Administered 2016-04-27 (×2): 80 ug via INTRAVENOUS

## 2016-04-27 MED ORDER — ACETAMINOPHEN 650 MG RE SUPP: 650.0000 mg | RECTAL | Status: DC | PRN

## 2016-04-27 MED ORDER — ACETAMINOPHEN 325 MG PO TABS: 650.0000 mg | ORAL_TABLET | ORAL | Status: DC | PRN

## 2016-04-27 MED ORDER — SODIUM CHLORIDE 0.9 % IV SOLN: INTRAVENOUS | Status: DC

## 2016-04-27 MED ORDER — MIDAZOLAM HCL 5 MG/5ML IJ SOLN
INTRAMUSCULAR | Status: DC | PRN
  Administered 2016-04-27: 2 mg via INTRAVENOUS

## 2016-04-27 MED ORDER — ARTIFICIAL TEARS OP OINT
TOPICAL_OINTMENT | OPHTHALMIC | Status: DC | PRN
  Administered 2016-04-27: 1 via OPHTHALMIC

## 2016-04-27 MED ORDER — GABAPENTIN 300 MG PO CAPS
300.0000 mg | ORAL_CAPSULE | Freq: Three times a day (TID) | ORAL | Status: DC
  Administered 2016-04-27 – 2016-04-29 (×6): 300 mg via ORAL
  Filled 2016-04-27 (×6): qty 1

## 2016-04-27 MED ORDER — FENTANYL CITRATE (PF) 100 MCG/2ML IJ SOLN
INTRAMUSCULAR | Status: DC | PRN
Start: 2016-04-27 — End: 2016-04-27
  Administered 2016-04-27 (×2): 50 ug via INTRAVENOUS
  Administered 2016-04-27: 150 ug via INTRAVENOUS
  Administered 2016-04-27: 50 ug via INTRAVENOUS
  Administered 2016-04-27 (×2): 25 ug via INTRAVENOUS
  Administered 2016-04-27: 50 ug via INTRAVENOUS

## 2016-04-27 MED ORDER — TRAMADOL HCL 50 MG PO TABS
50.0000 mg | ORAL_TABLET | Freq: Four times a day (QID) | ORAL | Status: DC | PRN
  Administered 2016-04-27 – 2016-04-29 (×5): 50 mg via ORAL
  Filled 2016-04-27 (×5): qty 1

## 2016-04-27 MED ORDER — SODIUM CHLORIDE 0.9 % IV SOLN
INTRAVENOUS | Status: DC | PRN
  Administered 2016-04-27: 10 ug/min via INTRAVENOUS

## 2016-04-27 MED ORDER — MIDAZOLAM HCL 2 MG/2ML IJ SOLN: 0.5000 mg | Freq: Once | INTRAMUSCULAR | Status: DC | PRN

## 2016-04-27 MED ORDER — PROPOFOL 10 MG/ML IV BOLUS
INTRAVENOUS | Status: DC | PRN
  Administered 2016-04-27: 200 mg via INTRAVENOUS

## 2016-04-27 SURGICAL SUPPLY — 70 items
APL SKNCLS STERI-STRIP NONHPOA (GAUZE/BANDAGES/DRESSINGS) ×1
BENZOIN TINCTURE PRP APPL 2/3 (GAUZE/BANDAGES/DRESSINGS) ×2 IMPLANT
BLADE CLIPPER SURG (BLADE) IMPLANT
BUR ACORN 6.0 (BURR) ×2 IMPLANT
BUR MATCHSTICK NEURO 3.0 LAGG (BURR) ×2 IMPLANT
CANISTER SUCT 3000ML PPV (MISCELLANEOUS) ×2 IMPLANT
CAP LOCKING THREADED (Cap) ×6 IMPLANT
CONT SPEC 4OZ CLIKSEAL STRL BL (MISCELLANEOUS) ×2 IMPLANT
COVER BACK TABLE 60X90IN (DRAPES) ×2 IMPLANT
CROSSLINK SPINAL FUSION (Cage) ×1 IMPLANT
DRAPE C-ARM 42X72 X-RAY (DRAPES) ×4 IMPLANT
DRAPE LAPAROTOMY 100X72X124 (DRAPES) ×2 IMPLANT
DRAPE POUCH INSTRU U-SHP 10X18 (DRAPES) ×2 IMPLANT
DRSG OPSITE POSTOP 4X6 (GAUZE/BANDAGES/DRESSINGS) ×1 IMPLANT
DRSG PAD ABDOMINAL 8X10 ST (GAUZE/BANDAGES/DRESSINGS) IMPLANT
DURAPREP 26ML APPLICATOR (WOUND CARE) ×2 IMPLANT
ELECT BLADE 4.0 EZ CLEAN MEGAD (MISCELLANEOUS) ×2
ELECT REM PT RETURN 9FT ADLT (ELECTROSURGICAL) ×2
ELECTRODE BLDE 4.0 EZ CLN MEGD (MISCELLANEOUS) IMPLANT
ELECTRODE REM PT RTRN 9FT ADLT (ELECTROSURGICAL) ×1 IMPLANT
EVACUATOR 1/8 PVC DRAIN (DRAIN) ×1 IMPLANT
EVACUATOR 3/16  PVC DRAIN (DRAIN) ×1
EVACUATOR 3/16 PVC DRAIN (DRAIN) IMPLANT
GAUZE SPONGE 4X4 12PLY STRL (GAUZE/BANDAGES/DRESSINGS) ×2 IMPLANT
GAUZE SPONGE 4X4 16PLY XRAY LF (GAUZE/BANDAGES/DRESSINGS) ×2 IMPLANT
GLOVE BIOGEL M 8.0 STRL (GLOVE) ×2 IMPLANT
GLOVE EXAM NITRILE LRG STRL (GLOVE) IMPLANT
GLOVE EXAM NITRILE MD LF STRL (GLOVE) IMPLANT
GLOVE EXAM NITRILE XL STR (GLOVE) IMPLANT
GLOVE EXAM NITRILE XS STR PU (GLOVE) IMPLANT
GLOVE INDICATOR 7.0 STRL GRN (GLOVE) ×1 IMPLANT
GLOVE INDICATOR 7.5 STRL GRN (GLOVE) ×1 IMPLANT
GLOVE SS N UNI LF 6.5 STRL (GLOVE) ×3 IMPLANT
GOWN STRL REUS W/ TWL LRG LVL3 (GOWN DISPOSABLE) ×1 IMPLANT
GOWN STRL REUS W/ TWL XL LVL3 (GOWN DISPOSABLE) IMPLANT
GOWN STRL REUS W/TWL 2XL LVL3 (GOWN DISPOSABLE) IMPLANT
GOWN STRL REUS W/TWL LRG LVL3 (GOWN DISPOSABLE) ×2
GOWN STRL REUS W/TWL XL LVL3 (GOWN DISPOSABLE)
KIT BASIN OR (CUSTOM PROCEDURE TRAY) ×2 IMPLANT
KIT INFUSE MEDIUM (Orthopedic Implant) ×1 IMPLANT
KIT ROOM TURNOVER OR (KITS) ×2 IMPLANT
MILL MEDIUM DISP (BLADE) ×1 IMPLANT
NDL HYPO 18GX1.5 BLUNT FILL (NEEDLE) IMPLANT
NDL HYPO 21X1.5 SAFETY (NEEDLE) IMPLANT
NDL HYPO 25X1 1.5 SAFETY (NEEDLE) IMPLANT
NEEDLE HYPO 18GX1.5 BLUNT FILL (NEEDLE) IMPLANT
NEEDLE HYPO 21X1.5 SAFETY (NEEDLE) IMPLANT
NEEDLE HYPO 25X1 1.5 SAFETY (NEEDLE) IMPLANT
NS IRRIG 1000ML POUR BTL (IV SOLUTION) ×2 IMPLANT
PACK LAMINECTOMY NEURO (CUSTOM PROCEDURE TRAY) ×2 IMPLANT
PAD ARMBOARD 7.5X6 YLW CONV (MISCELLANEOUS) ×6 IMPLANT
PATTIES SURGICAL .5 X1 (DISPOSABLE) ×2 IMPLANT
PATTIES SURGICAL .5 X3 (DISPOSABLE) IMPLANT
ROD CREO 100MM SPINAL (Rod) ×2 IMPLANT
SCREW CREO THREADED 5.5X45MM (Screw) ×6 IMPLANT
SEALER BIPOLAR AQUA 2.3 (INSTRUMENTS) ×1 IMPLANT
SPACER RISE 8X22 8-14MM-10 (Neuro Prosthesis/Implant) ×2 IMPLANT
SPONGE LAP 4X18 X RAY DECT (DISPOSABLE) IMPLANT
SPONGE NEURO XRAY DETECT 1X3 (DISPOSABLE) IMPLANT
SPONGE SURGIFOAM ABS GEL 100 (HEMOSTASIS) ×2 IMPLANT
STRIP CLOSURE SKIN 1/2X4 (GAUZE/BANDAGES/DRESSINGS) ×2 IMPLANT
SUT VIC AB 1 CT1 18XBRD ANBCTR (SUTURE) ×2 IMPLANT
SUT VIC AB 1 CT1 8-18 (SUTURE) ×4
SUT VIC AB 2-0 CP2 18 (SUTURE) ×3 IMPLANT
SUT VIC AB 3-0 SH 8-18 (SUTURE) ×3 IMPLANT
SYR 5ML LL (SYRINGE) IMPLANT
TOWEL OR 17X24 6PK STRL BLUE (TOWEL DISPOSABLE) ×2 IMPLANT
TOWEL OR 17X26 10 PK STRL BLUE (TOWEL DISPOSABLE) ×2 IMPLANT
TRAY FOLEY W/METER SILVER 16FR (SET/KITS/TRAYS/PACK) ×2 IMPLANT
WATER STERILE IRR 1000ML POUR (IV SOLUTION) ×2 IMPLANT

## 2016-04-27 NOTE — Evaluation (Signed)
Physical Therapy Evaluation Patient Details Name: MEGGIE LASETER MRN: [id number] DOB: [birth year] Today's Date: 04/27/2016   History of Present Illness  pt is a 65 y/o female who had l45 fusion in recent past, but pain returned.  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  Clinical Impression  Pt admitted with/for lumbar fusion surgery.  Pt currently limited functionally due to the problems listed below.  (see problems list.)  Pt will benefit from PT to maximize function and safety to be able to get home safely with available assist of family.     Follow Up Recommendations Home health PT    Equipment Recommendations  None recommended by PT    Recommendations for Other Services       Precautions / Restrictions Precautions Precautions: Back;Fall Required Braces or Orthoses: Spinal Brace Spinal Brace: Lumbar corset;Applied in sitting position Restrictions Weight Bearing Restrictions: No      Mobility  Bed Mobility Overal bed mobility: Needs Assistance Bed Mobility: Rolling;Sidelying to Sit;Sit to Sidelying Rolling: Min assist Sidelying to sit: Min assist     Sit to sidelying: Min assist General bed mobility comments: reinforced log roll and transition  Transfers                 General transfer comment: pt deferred further evaluation today  Ambulation/Gait                Stairs            Wheelchair Mobility    Modified Rankin (Stroke Patients Only)       Balance Overall balance assessment: No apparent balance deficits (not formally assessed)                                           Pertinent Vitals/Pain Pain Assessment: Faces Faces Pain Scale: Hurts little more Pain Location: back Pain Descriptors / Indicators: Aching;Sore Pain Intervention(s): Limited activity within patient's tolerance;Monitored during session    [place] expects to be discharged  to:: Private residence Living Arrangements: Spouse/significant other Available Help at Discharge: Family;Available 24 hours/day Type of Home: House Home Access: Stairs to enter Entrance Stairs-Rails: None Entrance Stairs-Number of Steps: 1 Home Layout: One level Home Equipment: Clinical cytogeneticist - 2 wheels;Cane - single point;Walker - 4 wheels      Prior Function Level of Independence: Independent         Comments: has used a cane recently due to the pain     Hand Dominance   Dominant Hand: Right    Extremity/Trunk Assessment               Lower Extremity Assessment: Overall WFL for tasks assessed;Generalized weakness         Communication   Communication: No difficulties  Cognition Arousal/Alertness: Awake/alert Behavior During Therapy: WFL for tasks assessed/performed Overall Cognitive Status: Within Functional Limits for tasks assessed                      General Comments General comments (skin integrity, edema, etc.): pt and daughter instructed in back care/prec, log roll and transitions to/from sit, lifting restrictions, bracing issues,  and progression of activity.    Exercises        Assessment/Plan    PT Assessment Patient needs continued PT services  PT Diagnosis Difficulty walking;Acute  pain   PT Problem List Decreased strength;Decreased activity tolerance;Decreased mobility;Decreased knowledge of use of DME;Decreased knowledge of precautions;Pain  PT Treatment Interventions DME instruction;Gait training;Functional mobility training;Therapeutic activities;Patient/family education   PT Goals (Current goals can be found in the Care Plan section) Acute Rehab PT Goals Patient Stated Goal: back independent PT Goal Formulation: With patient Time For Goal Achievement: 05/04/16 Potential to Achieve Goals: Good    Frequency Min 5X/week   Barriers to discharge        Co-evaluation               End of Session   Activity  Tolerance: Patient tolerated treatment well Patient left: in bed;with call bell/phone within reach;with family/visitor present Nurse Communication: Mobility status         Time: 1725-1747 PT Time Calculation (min) (ACUTE ONLY): 22 min   Charges:   PT Evaluation $PT Eval Low Complexity: 1 Procedure     PT G Codes:        Cullin Dishman, Tessie Fass 04/27/2016, 6:03 PM 04/27/2016  Donnella Sham, PT [phone]  (pager)

## 2016-04-27 NOTE — Anesthesia Postprocedure Evaluation (Signed)
Anesthesia Post Note  Patient: Megan [name]  Procedure(s) Performed: Procedure(s) (LRB): Lumbar two-three  Lumbar three-four Posterior lumbar interbody fusion with augmentation (N/A)  Patient location during evaluation: PACU Anesthesia Type: General Level of consciousness: oriented, patient cooperative and sedated Pain management: pain level controlled (pain improving) Vital Signs Assessment: post-procedure vital signs reviewed and stable Respiratory status: spontaneous breathing, nonlabored ventilation, respiratory function stable and patient connected to nasal cannula oxygen Cardiovascular status: blood pressure returned to baseline and stable Postop Assessment: no signs of nausea or vomiting Anesthetic complications: no    Last Vitals:  Vitals:   04/27/16 1454 04/27/16 1603  BP: 128/71 (!) 147/65  Pulse: (!) 59 87  Resp: 18 18  Temp: 36.7 C 36.8 C    Last Pain:  Vitals:   04/27/16 1555  TempSrc:   PainSc: 9                  Deshondra Worst,E. Catherine Cubero

## 2016-04-27 NOTE — Anesthesia Procedure Notes (Signed)
Procedure Name: Intubation Date/Time: 04/27/2016 8:20 AM Performed by: Merrilyn Puma B Pre-anesthesia Checklist: Patient identified, Emergency Drugs available, Suction available, Timeout performed and Patient being monitored Patient Re-evaluated:Patient Re-evaluated prior to inductionOxygen Delivery Method: Circle system utilized Preoxygenation: Pre-oxygenation with 100% oxygen Intubation Type: IV induction Ventilation: Mask ventilation without difficulty Laryngoscope Size: Mac and 3 Grade View: Grade I Tube type: Oral Tube size: 7.0 mm Number of attempts: 1 Airway Equipment and Method: Stylet Placement Confirmation: ETT inserted through vocal cords under direct vision,  positive ETCO2,  breath sounds checked- equal and bilateral and CO2 detector Secured at: 21 cm Tube secured with: Tape Dental Injury: Teeth and Oropharynx as per pre-operative assessment  Comments: Inserted by srna

## 2016-04-27 NOTE — Anesthesia Preprocedure Evaluation (Addendum)
Anesthesia Evaluation  Patient identified by MRN, date of birth, ID band Patient awake    Reviewed: Allergy & Precautions, NPO status , Patient's Chart, lab work & pertinent test results  History of Anesthesia Complications (+) PONV and history of anesthetic complications  Airway Mallampati: II  TM Distance: >3 FB Neck ROM: Full    Dental  (+) Partial Upper, Partial Lower, Missing, Dental Advisory Given,    Pulmonary former smoker (quit '07),    breath sounds clear to auscultation       Cardiovascular (-) anginanegative cardio ROS   Rhythm:Regular Rate:Normal     Neuro/Psych Chronic back pain: tramadol    GI/Hepatic negative GI ROS,   Endo/Other  negative endocrine ROS  Renal/GU negative Renal ROS     Musculoskeletal  (+) Arthritis ,   Abdominal   Peds  Hematology negative hematology ROS (+)   Anesthesia Other Findings   Reproductive/Obstetrics                           Anesthesia Physical Anesthesia Plan  ASA: II  Anesthesia Plan: General   Post-op Pain Management:    Induction: Intravenous  Airway Management Planned: Oral ETT  Additional Equipment:   Intra-op Plan:   Post-operative Plan: Extubation in OR  Informed Consent: I have reviewed the patients History and Physical, chart, labs and discussed the procedure including the risks, benefits and alternatives for the proposed anesthesia with the patient or authorized representative who has indicated his/her understanding and acceptance.   Dental advisory given  Plan Discussed with: Anesthesiologist, Surgeon and CRNA  Anesthesia Plan Comments: (Plan routine monitors, GETA)       Anesthesia Quick Evaluation

## 2016-04-27 NOTE — Transfer of Care (Signed)
Immediate Anesthesia Transfer of Care Note  Patient: Megan [name]  Procedure(s) Performed: Procedure(s): Lumbar two-three  Lumbar three-four Posterior lumbar interbody fusion with augmentation (N/A)  Patient Location: PACU  Anesthesia Type:General  Level of Consciousness: awake, alert  and oriented  Airway & Oxygen Therapy: Patient Spontanous Breathing and Patient connected to face mask oxygen  Post-op Assessment: Report given to RN, Post -op Vital signs reviewed and stable and Patient moving all extremities X 4  Post vital signs: Reviewed and stable  Last Vitals:  Vitals:   04/27/16 0638  BP: (!) 196/77  Pulse: 67  Resp: 20    Last Pain:  Vitals:   04/27/16 0638  TempSrc: Oral      Patients Stated Pain Goal: 3 (03/83/33 8329)  Complications: No apparent anesthesia complications

## 2016-04-27 NOTE — Progress Notes (Signed)
  Pharmacy Antibiotic Note  Megan [name] is a 65 y.o. female admitted on 04/27/2016 with for lumbar fusion. Pharmacy has been consulted for vancomycin dosing for surgical prophylaxis.  Scr 0.84 on 8/8, est. crcl ~ 75 ml/min. Received pre-op dose 1g vancomycin at ~ 0825. Pt has closed system hemovac in place.   Plan: Vancomycin 1000 mg Q 12, next dose at 2000. Monitor renal function F/u drain status  Height: '5\' 8"'$  (172.7 cm) Weight: 169 lb (76.7 kg) IBW/kg (Calculated) : 63.9  Temp (24hrs), Avg:97.8 F (36.6 C), Min:97.5 F (36.4 C), Max:98.1 F (36.7 C)  No results for input(s): WBC, CREATININE, LATICACIDVEN, VANCOTROUGH, VANCOPEAK, VANCORANDOM, GENTTROUGH, GENTPEAK, GENTRANDOM, TOBRATROUGH, TOBRAPEAK, TOBRARND, AMIKACINPEAK, AMIKACINTROU, AMIKACIN in the last 168 hours.  Estimated Creatinine Clearance: 74.7 mL/min (by C-G formula based on SCr of 0.84 mg/dL).    Allergies  Allergen Reactions  . Shellfish Allergy Anaphylaxis, Swelling and Other (See Comments)    Tongue swells  . Oxycodone Nausea Only  . Penicillins Rash    Has patient had a PCN reaction causing immediate rash, facial/tongue/throat swelling, SOB or lightheadedness with hypotension: {no Has patient had a PCN reaction causing severe rash involving mucus membranes or skin necrosis: rash Has patient had a PCN reaction that required hospitalization {no Has patient had a PCN reaction occurring within the last 10 years: {yes If all of the above answers are "NO", then may proceed with Cephalosporin use.    Antimicrobials this admission: Vancomycin 8/15 >>    Dose adjustments this admission:   Microbiology results:   Thank you for allowing pharmacy to be a part of this patient's care.  Maryanna Shape, PharmD, BCPS  Clinical Pharmacist  Pager: [phone]   04/27/2016 3:14 PM

## 2016-04-28 LAB — GLUCOSE, CAPILLARY
GLUCOSE-CAPILLARY: 202 mg/dL — AB (ref 65–99)
Glucose-Capillary: 128 mg/dL — ABNORMAL HIGH (ref 65–99)
Glucose-Capillary: 188 mg/dL — ABNORMAL HIGH (ref 65–99)
Glucose-Capillary: 205 mg/dL — ABNORMAL HIGH (ref 65–99)

## 2016-04-28 MED FILL — Sodium Chloride IV Soln 0.9%: INTRAVENOUS | Qty: 1000 | Status: AC

## 2016-04-28 MED FILL — Heparin Sodium (Porcine) Inj 1000 Unit/ML: INTRAMUSCULAR | Qty: 30 | Status: AC

## 2016-04-28 NOTE — Op Note (Signed)
Megan [name], Megan [name]              ACCOUNT NO.:  [id number]  MEDICAL RECORD NO.:  [id number]  LOCATION:  [facility]                         FACILITY:  [facility]  PHYSICIAN:  Leeroy Cha, M.D.   DATE OF BIRTH:  27-Dec-1950  DATE OF PROCEDURE:  04/27/2016 DATE OF DISCHARGE:                              OPERATIVE REPORT   PREOPERATIVE DIAGNOSES:  L2-L3 spondylolisthesis.  L3-4 degenerative disk disease with facet arthropathy.  Chronic radiculopathy.  Status post fusion, L4-5.  POSTOPERATIVE DIAGNOSES:  L2-L3 spondylolisthesis.  L3-4 degenerative disk disease with facet arthropathy.  Chronic radiculopathy.  Status post fusion, L4-5.  PROCEDURE:  Bilateral L3-L4 laminectomy, bilateral L3-L4 diskectomy more than normal to introduce two cages.  Decompression of the thecal sac on the L3-L4 nerve root as well as the L2.  Pedicle screws from L2-L3. _follow by_________ re-fusion from L2-L5.  Removal of the pedicle screws of L4. Posterolateral arthrodesis from L2-L5 with a BMP and autograft.  Cell Saver.  C-arm.  SURGEON:  Leeroy Cha, M.D.  ASSISTANT:  Earleen Newport, M.D.  CLINICAL HISTORY:  Megan [name] is a lady who underwent fusion at the L4- 5.  The patient did well.  She went back to work, but for the past few months, she had been complaining of back pain radiation to both legs associated with weakness proximally.  X-ray showed that she has spondylolisthesis at L2-3, which was not there before and severe facet arthropathy and stenosis at the L3-4.  The area where she had the surgery was fused.  Although, she was in need of fusion at the L2-3.  I have a concern about leaving L3-L4 without being involved in the fusion because she will develop most likely listhesis__________ at this area.  Because of the large facet, we decided to proceed with decompression and fusion of those two levels.  DESCRIPTION OF PROCEDURE:  The patient was taken to the OR.  After intubation, she was positioned  on a prone manner.  The back was cleaned with Betadine and DuraPrep.  Drapes were applied.  Midline incision from L1-2 down to L4-5 was made and muscles were retracted all the way laterally.  We were able to see the lateral aspect of the facet.  We identified the L4-5 fused area.  Then, we went ahead and we removed the spinous process of L2 and L3 as well as the facet.  We attempted to get into the disk of L2-L3, but the area was quite narrow.  It was difficult to introduce including the blade of the knife.  Decompression of the L2 and L3 nerve root was done and we proceeded down to the level of L3-4 with bilateral total gross diskectomy more than normal was done.  We were able to introduce two cages, we were expanded to 17.  The rest of the disk space was refused using autograft and BMP.  Having done this, we decided that it would be better to remove the pedicle screws of L4, which was done.  The hole from the pedicle screws were packed with BMP. Then, a rod all the way down to L2-L5 was made and were secured in place with Capps.  Then, we went laterally and  we removed the periosteum of 2- 3, 3-4 and 4-5 and a fusion using autograft and BMP was done.  From then on, Valsalva maneuver was negative.  Then, a Hemovac was left in the operative site and the wound was closed with Vicryl and staples.  At the end, the procedure was done with laminectomy of L2-L3, removal of the pedicles from L4, interbody fusion of the level of L3-4 with posterolateral arthrodesis.          ______________________________ Leeroy Cha, M.D.     EB/MEDQ  D:  04/27/2016  T:  04/27/2016  Job:  [id number]

## 2016-04-28 NOTE — Evaluation (Signed)
Occupational Therapy Evaluation Patient Details Name: Megan [name] MRN: [id number] DOB: 06/01/1951 Today's Date: 04/28/2016    History of Present Illness pt is a 65 y/o female who had l45 fusion in recent past, but pain returned.  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  Past Medical History:  Diagnosis Date  . Arthritis   . Borderline diabetic   . History of bronchitis 2005  . History of kidney stones   . History of shingles   . Hyperlipidemia    takes Fish Oil daily  . Neuromuscular disorder (HCC)    tingling toes  . Pneumonia 2009  . PONV (postoperative nausea and vomiting)   . Restless leg   . Weakness    numbness and tingling in both feet r/t back      Clinical Impression   Patient is s/p PLIF L2-3 L3-4  surgery resulting in functional limitations due to the deficits listed below (see OT problem list). PTA was independent with previous back surg.  Patient will benefit from skilled OT acutely to increase independence and safety with ADLS to allow discharge home without follow up. Session greatly limited due to orthostatic with transfer to chair. Pt BP in chair 88/53 with symptoms and supine in bed 92/52. RN notified and address patient.     Follow Up Recommendations  No OT follow up    Equipment Recommendations  None recommended by OT    Recommendations for Other Services       Precautions / Restrictions Precautions Precautions: Back;Fall Precaution Comments: handout provided and reviewed in detail with spouse present Required Braces or Orthoses: Spinal Brace Spinal Brace: Lumbar corset;Applied in sitting position      Mobility Bed Mobility Overal bed mobility: Needs Assistance Bed Mobility: Rolling;Supine to Sit;Sit to Supine Rolling: Min guard Sidelying to sit: Min guard Supine to sit: Min guard Sit to supine: Mod assist   General bed mobility comments: (A) for bil LE to return to  supine  Transfers Overall transfer level: Needs assistance Equipment used: Rolling walker (2 wheeled) Transfers: Sit to/from Stand Sit to Stand: Min assist         General transfer comment: cues for hand placement     Balance Overall balance assessment: Needs assistance   Sitting balance-Leahy Scale: Fair       Standing balance-Leahy Scale: Poor                              ADL Overall ADL's : Needs assistance/impaired Eating/Feeding: Set up;Bed level   Grooming: Oral care;Set up;Bed level           Upper Body Dressing : Sitting;Supervision/safety Upper Body Dressing Details (indicate cue type and reason): able to don brace  Lower Body Dressing: Moderate assistance;Sit to/from stand   Toilet Transfer: Minimal assistance;Stand-pivot             General ADL Comments: Pt upon transfer becoming "light headed per patient" pt with palor appearance. Pt found to have decr BP. Pt returned from chair to bed supine position.      Vision     Perception     Praxis      Pertinent Vitals/Pain Pain Assessment: Faces Faces Pain Scale: Hurts a little bit Pain Location: back Pain Descriptors / Indicators: Operative site guarding Pain Intervention(s): Limited activity within patient's tolerance;Monitored during session;Repositioned;Premedicated before session     Hand Dominance  Right   Extremity/Trunk Assessment Upper Extremity Assessment Upper Extremity Assessment: Overall WFL for tasks assessed   Lower Extremity Assessment Lower Extremity Assessment: Defer to PT evaluation   Cervical / Trunk Assessment Cervical / Trunk Assessment: Other exceptions (s/p surg)   Communication Communication Communication: No difficulties   Cognition Arousal/Alertness: Awake/alert Behavior During Therapy: Flat affect Overall Cognitive Status: Within Functional Limits for tasks assessed                     General Comments       Exercises        Shoulder Instructions      Home Living Family/patient expects to be discharged to:: Private residence Living Arrangements: Spouse/significant other Available Help at Discharge: Family;Available 24 hours/day Type of Home: House Home Access: Stairs to enter [employer] of Steps: 1 Entrance Stairs-Rails: None Home Layout: One level     Bathroom Shower/Tub: Tub/shower unit;Door   [employer] Toilet: Standard Bathroom Accessibility: No   Home Equipment: Clinical cytogeneticist - 2 wheels;Cane - single point;Walker - 4 wheels;Hand held shower head          Prior Functioning/Environment Level of Independence: Independent        Comments: has used a cane recently due to the pain    OT Diagnosis: Generalized weakness;Acute pain   OT Problem List: Decreased strength;Decreased activity tolerance;Impaired balance (sitting and/or standing);Decreased safety awareness;Decreased knowledge of use of DME or AE;Decreased knowledge of precautions;Cardiopulmonary status limiting activity;Pain   OT Treatment/Interventions: Self-care/ADL training;Therapeutic exercise;DME and/or AE instruction;Therapeutic activities;Patient/family education;Balance training    OT Goals(Current goals can be found in the care plan section) Acute Rehab OT Goals Patient Stated Goal: none specifically stated OT Goal Formulation: With patient Time For Goal Achievement: 05/12/16 Potential to Achieve Goals: Good  OT Frequency: Min 2X/week   Barriers to D/C:            Co-evaluation              End of Session Equipment Utilized During Treatment: Gait belt;Rolling walker;Back brace Nurse Communication: Mobility status;Precautions  Activity Tolerance: Other (comment) (decr Bp) Patient left: in bed;with call bell/phone within reach;with family/visitor present   Time: 0174-9449 OT Time Calculation (min): 27 min Charges:  OT General Charges $OT Visit: 1 Procedure OT Evaluation $OT Eval Moderate  Complexity: 1 Procedure OT Treatments $Self Care/Home Management : 8-22 mins G-Codes:    Parke Poisson B May 09, 2016, 3:44 PM   Jeri Modena   OTR/L Pager: [phone] Office: [phone] .

## 2016-04-28 NOTE — Progress Notes (Signed)
Physical Therapy Treatment Patient Details Name: Megan [name] MRN: [id number] DOB: Nov 06, 1950 Today's Date: 04/28/2016    History of Present Illness pt is a 65 y/o female who had l45 fusion in recent past, but pain returned.  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    PT Comments    Patient progressing with mobility this session able to walk in hallway with walker.  Feel she will be able to go home with spouse assist.  Doubt follow up PT needed at this time and pt concerned about her insurance coverage.  Will continue to follow acutely for skilled PT until d/c.   Follow Up Recommendations  No PT follow up     Equipment Recommendations  None recommended by PT    Recommendations for Other Services       Precautions / Restrictions Precautions Precautions: Back;Fall Required Braces or Orthoses: Spinal Brace Spinal Brace: Lumbar corset;Applied in sitting position    Mobility  Bed Mobility Overal bed mobility: Needs Assistance Bed Mobility: Rolling;Sidelying to Sit Rolling: Supervision Sidelying to sit: Supervision       General bed mobility comments: use of rail and demo good technique without cues  Transfers Overall transfer level: Needs assistance Equipment used: Rolling walker (2 wheeled) Transfers: Sit to/from Stand Sit to Stand: Min assist         General transfer comment: assist for balance, steadying due to earlier session with OT pt with decreased responsiveness and orthostatic  Ambulation/Gait Ambulation/Gait assistance: Min assist;Min guard Ambulation Distance (Feet): 140 Feet Assistive device: Rolling walker (2 wheeled) Gait Pattern/deviations: Step-through pattern;Decreased stride length;Antalgic     General Gait Details: shorter stance time on R, reports this was prior to surgery. spouse close during walk and able to assist if needed   Stairs            Wheelchair Mobility    Modified  Rankin (Stroke Patients Only)       Balance Overall balance assessment: Needs assistance   Sitting balance-Leahy Scale: Good       Standing balance-Leahy Scale: Fair Standing balance comment: can stand without UE support, but needs walker for ambulation                    Cognition Arousal/Alertness: Awake/alert Behavior During Therapy: WFL for tasks assessed/performed Overall Cognitive Status: Within Functional Limits for tasks assessed                      Exercises      General Comments General comments (skin integrity, edema, etc.): sat EOB several minutes prior to standing due to earlier session with less responsiveness and lower BP      Pertinent Vitals/Pain Pain Assessment: 0-10 Faces Pain Scale: Hurts little more Pain Location: back Pain Descriptors / Indicators: Aching;Sore Pain Intervention(s): Monitored during session;Limited activity within patient's tolerance;Repositioned    Home Living                      Prior Function            PT Goals (current goals can now be found in the care plan section) Progress towards PT goals: Progressing toward goals    Frequency  Min 5X/week    PT Plan Discharge plan needs to be updated    Co-evaluation             End of Session Equipment Utilized  During Treatment: Gait belt;Back brace Activity Tolerance: Patient tolerated treatment well Patient left: in chair;with call bell/phone within reach;with family/visitor present     Time: 5638-9373 PT Time Calculation (min) (ACUTE ONLY): 25 min  Charges:  $Gait Training: 8-22 mins $Therapeutic Activity: 8-22 mins                    G Codes:      Reginia Naas 2016/05/14, 11:28 AM  Magda Kiel, [place] 2016/05/14

## 2016-04-28 NOTE — Progress Notes (Signed)
Patient ID: Megan [name], female   DOB: [birth year], 65 y.o.   MRN: [id number] Sleepy, moves all 4 extremities, c/o incisional pain. Seen by pt

## 2016-04-28 NOTE — Progress Notes (Signed)
Inpatient Diabetes Program Recommendations  AACE/ADA: New Consensus Statement on Inpatient Glycemic Control (2015)  Target Ranges:  Prepandial:   less than 140 mg/dL      Peak postprandial:   less than 180 mg/dL (1-2 hours)      Critically ill patients:  140 - 180 mg/dL   Results for DAINE, CROKER (MRN [id number]) as of 04/28/2016 19:05  Ref. Range 04/27/2016 06:42 04/27/2016 18:08 04/27/2016 22:13 04/28/2016 07:51 04/28/2016 11:51 04/28/2016 16:44  Glucose-Capillary Latest Ref Range: 65 - 99 mg/dL 170 (H) 213 (H) 240 (H) 128 (H) 202 (H) 188 (H)    Review of Glycemic Control  Diabetes history: Patient states hx borderline  Inpatient Diabetes Program Recommendations:  Spoke with patient regarding elevated CBGs. Patient states she has had prediabetes several years and thought dx diabetes started @ 7.0. Requested patient to follow with PCP and noted A1c 6.8 in 2016. Please consider A1c to determine prehospital glycemic control. Reviewed basic plate method with patient and offered videos to watch while in the hospital. Will order living well with diabetes book for patient to review.  Thank you, Nani Gasser. Hue Frick, RN, MSN, CDE Inpatient Glycemic Control Team Team Pager [phone] (8am-5pm) 04/28/2016 7:18 PM

## 2016-04-29 LAB — BASIC METABOLIC PANEL
ANION GAP: 2 — AB (ref 5–15)
BUN: 11 mg/dL (ref 6–20)
CHLORIDE: 107 mmol/L (ref 101–111)
CO2: 27 mmol/L (ref 22–32)
Calcium: 8.4 mg/dL — ABNORMAL LOW (ref 8.9–10.3)
Creatinine, Ser: 0.89 mg/dL (ref 0.44–1.00)
GFR calc non Af Amer: >60 mL/min (ref >60)
Glucose, Bld: 177 mg/dL — ABNORMAL HIGH (ref 65–99)
POTASSIUM: 5.1 mmol/L (ref 3.5–5.1)
Sodium: 136 mmol/L (ref 135–145)

## 2016-04-29 LAB — GLUCOSE, CAPILLARY
GLUCOSE-CAPILLARY: 196 mg/dL — AB (ref 65–99)
Glucose-Capillary: 197 mg/dL — ABNORMAL HIGH (ref 65–99)

## 2016-04-29 NOTE — Progress Notes (Signed)
Discharge education/instructions/Rx given to patient with husband at bedside and they both verbalized understanding. Pain is mild to moderate, well controlled by pain pills. No redness, no swelling, no drainage noted on incision site. Patient d/c via wheelchair

## 2016-04-29 NOTE — Progress Notes (Signed)
Patient ID: Megan [name], female   DOB: 20-Oct-1950, 65 y.o.   MRN: [id number] Doing well. C/o incisional pin. hemovac out. Dc this pm or in am

## 2016-04-29 NOTE — Progress Notes (Signed)
Physical Therapy Treatment Patient Details Name: Megan [name] MRN: [id number] DOB: [birth year] Today's Date: 04/29/2016    History of Present Illness pt is a 65 y/o female who had l45 fusion in recent past, but pain returned.  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    PT Comments    Patient progressing well towards PT goals. Tolerated gait training with Min guard assist for safety but continues to report light headedness towards end of ambulation bout most likely due to low BP. Pt able to recall 3/3 back precautions. Spouse present and guarding appropriately during mobility. Will follow.   Follow Up Recommendations  No PT follow up     Equipment Recommendations  None recommended by PT    Recommendations for Other Services       Precautions / Restrictions Precautions Precautions: Back;Fall Precaution Booklet Issued: No Precaution Comments: Pt able to recall 3/3 precautions. Required Braces or Orthoses: Spinal Brace Spinal Brace: Lumbar corset;Applied in sitting position Restrictions Weight Bearing Restrictions: No    Mobility  Bed Mobility Overal bed mobility: Needs Assistance Bed Mobility: Rolling;Sidelying to Sit;Sit to Sidelying Rolling: Supervision Sidelying to sit: Supervision;HOB elevated     Sit to sidelying: Min assist General bed mobility comments: (A) for bil LE to return to supine  Transfers Overall transfer level: Needs assistance Equipment used: Rolling walker (2 wheeled) Transfers: Sit to/from Stand Sit to Stand: Min guard         General transfer comment: Min guard for safety. Stood from [employer].   Ambulation/Gait Ambulation/Gait assistance: Min guard Ambulation Distance (Feet): 160 Feet Assistive device: Rolling walker (2 wheeled) Gait Pattern/deviations: Step-through pattern;Decreased stride length;Antalgic Gait velocity: decreased   General Gait Details: Decreased stance time on RLE;  slow, steady gait. No overt LOB. + wooziness post ambulation bout. BP 106/51.   Stairs            Wheelchair Mobility    Modified Rankin (Stroke Patients Only)       Balance Overall balance assessment: Needs assistance Sitting-balance support: Feet supported;No upper extremity supported Sitting balance-Leahy Scale: Fair     Standing balance support: During functional activity Standing balance-Leahy Scale: Poor Standing balance comment: Reliant on RW for support.                    Cognition Arousal/Alertness: Awake/alert Behavior During Therapy: WFL for tasks assessed/performed Overall Cognitive Status: Within Functional Limits for tasks assessed                      Exercises      General Comments General comments (skin integrity, edema, etc.): Spouse present during session. Hemovac intact.       Pertinent Vitals/Pain Pain Assessment: Faces Faces Pain Scale: Hurts a little bit Pain Location: back Pain Descriptors / Indicators: Operative site guarding;Sore Pain Intervention(s): Monitored during session;Repositioned    Home Living                      Prior Function            PT Goals (current goals can now be found in the care plan section) Progress towards PT goals: Progressing toward goals    Frequency  Min 5X/week    PT Plan Current plan remains appropriate    Co-evaluation             End of Session Equipment Utilized During  Treatment: Gait belt;Back brace Activity Tolerance: Patient tolerated treatment well Patient left: in bed;with call bell/phone within reach;with nursing/sitter in room;with family/visitor present;with SCD's reapplied     Time: 7543-6067 PT Time Calculation (min) (ACUTE ONLY): 16 min  Charges:  $Gait Training: 8-22 mins                    G Codes:      Louis Ivery A Sarika Baldini 04/29/2016, 8:48 AM  Wray Kearns, [place], DPT [phone]

## 2016-04-29 NOTE — Progress Notes (Signed)
Occupational Therapy Treatment Patient Details Name: Megan [name] MRN: [id number] DOB: Oct 24, 1950 Today's Date: 04/29/2016    History of present illness pt is a 65 y/o female who had l45 fusion in recent past, but pain returned.  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   OT comments  Pt able to meet all OT goals adequately for d/c from acute OT services today. Pt able to perform toilet and simulated tub transfer with supervision for safety. Pt able to recall 3/3 back precautions, don back brace with set up, and perform bed mobility with supervision. Pt planning to d/c home with husband to assist as needed. All back, safety, and ADL education complete; pt with no further questions or concerns for OT at this time. No further acute OT needs identified; signing off a this time. Please re-consult if needs change.    Follow Up Recommendations  No OT follow up    Equipment Recommendations  None recommended by OT    Recommendations for Other Services      Precautions / Restrictions Precautions Precautions: Back;Fall Precaution Booklet Issued: No Precaution Comments: Pt able to recall 3/3 back precautions. Required Braces or Orthoses: Spinal Brace Spinal Brace: Lumbar corset;Applied in sitting position Restrictions Weight Bearing Restrictions: No       Mobility Bed Mobility Overal bed mobility: Needs Assistance Bed Mobility: Rolling;Sidelying to Sit Rolling: Supervision Sidelying to sit: Supervision     Sit to sidelying: Min assist General bed mobility comments: Supervision for safety. HOB flat with use of bed rails. Pt with good log roll technique.  Transfers Overall transfer level: Needs assistance Equipment used: Rolling walker (2 wheeled) Transfers: Sit to/from Stand Sit to Stand: Supervision         General transfer comment: Supervision for safety; no physical assist required. Pt with good hand placement and  technique with RW.    Balance Overall balance assessment: Needs assistance Sitting-balance support: Feet supported;No upper extremity supported Sitting balance-Leahy Scale: Fair     Standing balance support: No upper extremity supported;During functional activity Standing balance-Leahy Scale: Fair Standing balance comment: Pt able to stand at sink and complete grooming activities without UE support.                    ADL Overall ADL's : Needs assistance/impaired Eating/Feeding: Set up;Sitting   Grooming: Supervision/safety;Standing;Wash/dry hands           Upper Body Dressing : Set up;Sitting Upper Body Dressing Details (indicate cue type and reason): to don back brace and hospital gown     Toilet Transfer: Supervision/safety;Ambulation;BSC;RW   Toileting- Clothing Manipulation and Hygiene: Supervision/safety;Sitting/lateral lean Toileting - Clothing Manipulation Details (indicate cue type and reason): for peri care only. No twisting observed. Tub/ Shower Transfer: Supervision/safety;Tub transfer;Ambulation;Shower Technical sales engineer Details (indicate cue type and reason): Educated pt on tub transfer technique; pt able to return demo with supervision for safety. Discussed need for supervision with tub transfers; husband agreeable to provided needed level of assist. Functional mobility during ADLs: Supervision/safety;Rolling walker General ADL Comments: Pt reports slight dizziness with initial supine to sit; after sitting ~1 minutes symptoms resolved. Educated pt on energy conservation strategies and strategies to prevent falls with dizziness during changes in position.      Vision                     Perception     Praxis  Cognition   Behavior During Therapy: WFL for tasks assessed/performed Overall Cognitive Status: Within Functional Limits for tasks assessed                       Extremity/Trunk Assessment                Exercises     Shoulder Instructions       General Comments      Pertinent Vitals/ Pain       Pain Assessment: Faces Faces Pain Scale: Hurts a little bit Pain Location: back Pain Descriptors / Indicators: Sore Pain Intervention(s): Monitored during session;Repositioned  Home Living                                          Prior Functioning/Environment              Frequency Min 2X/week     Progress Toward Goals  OT Goals(current goals can now be found in the care plan section)  Progress towards OT goals: Goals met/education completed, patient discharged from OT  Acute Rehab OT Goals Patient Stated Goal: none stated OT Goal Formulation: All assessment and education complete, DC therapy  Plan Discharge plan remains appropriate;All goals met and education completed, patient discharged from OT services    Co-evaluation                 End of Session Equipment Utilized During Treatment: Gait belt;Rolling walker;Back brace   Activity Tolerance Patient tolerated treatment well   Patient Left in bed;with call bell/phone within reach;with family/visitor present (sitting EOB)   Nurse Communication Mobility status        Time: 3013-1438 OT Time Calculation (min): 16 min  Charges: OT General Charges $OT Visit: 1 Procedure OT Treatments $Self Care/Home Management : 8-22 mins  Binnie Kand M.S., OTR/L Pager: [phone]  04/29/2016, 9:43 AM

## 2016-04-30 NOTE — Discharge Summary (Signed)
Physician Discharge Summary  Patient ID: Megan [name] MRN: [id number] DOB/AGE: July 27, 1951 65 y.o.  Admit date: 04/27/2016 Discharge date: 04/30/2016  Admission Diagnoses:degenerative lumbar spondylolisthesis. rlumbar radiculopathy  Discharge Diagnoses:  Active Problems:   Congenital spondylolisthesis of lumbar region   Discharged Condition: ambulating  Hospital Course: surgery  Consults: none  Significant Diagnostic Studies: myelogram  Treatments: augmentation of previous fusion at l45 to l2-3, 3-4  Discharge Exam: Wound dry. Ambulating. No weakness.   Disposition: to see me in 3 weeks     Medication List    ASK your doctor about these medications   aspirin 81 MG tablet Take 81 mg by mouth daily.   cholecalciferol 1000 units tablet Commonly known as:  VITAMIN D Take 2,000 Units by mouth daily.   CoQ10 100 MG Caps Take 1 capsule by mouth daily.   CRANBERRY PO Take 168 mg by mouth daily.   fexofenadine 180 MG tablet Commonly known as:  ALLEGRA Take 180 mg by mouth daily.   Fish Oil 1200 MG Caps Take 1 capsule by mouth 2 (two) times daily.   gabapentin 300 MG capsule Commonly known as:  NEURONTIN Take 300 mg by mouth 3 (three) times daily.   ibuprofen 200 MG tablet Commonly known as:  ADVIL,MOTRIN Take 400-600 mg by mouth every 6 (six) hours as needed for moderate pain.   multivitamin with minerals Tabs tablet Take 1 tablet by mouth daily.   traMADol 50 MG tablet Commonly known as:  ULTRAM Take 50 mg by mouth every 6 (six) hours as needed for moderate pain.        Signed: Floyce Stakes 04/30/2016, 9:55 AM

## 2016-04-30 NOTE — Discharge Summary (Signed)
Dc yesterday

## 2016-11-02 ENCOUNTER — Ambulatory Visit (INDEPENDENT_AMBULATORY_CARE_PROVIDER_SITE_OTHER): Payer: BLUE CROSS/BLUE SHIELD | Admitting: Primary Care

## 2016-11-02 ENCOUNTER — Encounter: Payer: Self-pay | Admitting: Primary Care

## 2016-11-02 VITALS — BP 164/94 | HR 73 | Temp 98.1°F | Ht 68.0 in | Wt 164.1 lb

## 2016-11-02 DIAGNOSIS — M4316 Spondylolisthesis, lumbar region: Secondary | ICD-10-CM

## 2016-11-02 DIAGNOSIS — E119 Type 2 diabetes mellitus without complications: Secondary | ICD-10-CM | POA: Diagnosis not present

## 2016-11-02 DIAGNOSIS — I10 Essential (primary) hypertension: Secondary | ICD-10-CM | POA: Diagnosis not present

## 2016-11-02 DIAGNOSIS — E785 Hyperlipidemia, unspecified: Secondary | ICD-10-CM | POA: Diagnosis not present

## 2016-11-02 DIAGNOSIS — E1165 Type 2 diabetes mellitus with hyperglycemia: Secondary | ICD-10-CM | POA: Insufficient documentation

## 2016-11-02 NOTE — Assessment & Plan Note (Signed)
Endorses prior history of hyperlipidemia, never on meds. Check lipids and LFT's today. Consider statin treatment given diabetes and hypertension. Will await results.

## 2016-11-02 NOTE — Assessment & Plan Note (Signed)
Above goal in clinic today, also with elevated readings in chart. Will have her monitor BP at home for next 2 weeks, if above goal will add in ACE/ARB.

## 2016-11-02 NOTE — Patient Instructions (Signed)
Check your blood pressure daily, around the same time of day, for the next 2 weeks.  Ensure that you have rested for 30 minutes prior to checking your blood pressure. Record your readings and I will call you for those readings in 2 weeks.  Schedule a lab only appointment to have your cholesterol, diabetes, kidney function. Ensure you are fasting, you may have water and black coffee.  We will contact you once we receive your lab results and will discuss follow up at that time.   It is important that you improve your diet. Please limit carbohydrates in the form of white bread, rice, pasta, sweets, fast food, fried food, sugary drinks, etc. Increase your consumption of fresh fruits and vegetables, whole grains, lean protein.  Ensure you are consuming 64 ounces of water daily.  It was a pleasure to meet you today! Please don't hesitate to call me with any questions. Welcome to [facility]!

## 2016-11-02 NOTE — Assessment & Plan Note (Signed)
Chronic, following with neurosurgery. Due for MRI soon. Stable on Gabapentin, Ibuprofen, Tramadol PRN.

## 2016-11-02 NOTE — Progress Notes (Signed)
Pre visit review using our clinic review tool, if applicable. No additional management support is needed unless otherwise documented below in the visit note. 

## 2016-11-02 NOTE — Assessment & Plan Note (Signed)
Denies prior diagnosis, A1C of 6.8 from 2016. Recheck A1C today, add urine microalbumin.

## 2016-11-02 NOTE — Progress Notes (Signed)
Subjective:    Patient ID: Megan [name], female    DOB: 11-27-1950, 66 y.o.   MRN: [id number]  HPI  Megan [name] is a 66 year old female who presents today to establish care and discuss the problems mentioned below. Will obtain old records. Her last physical was over 1 year ago.   1) Chronic Back Pain: History of spondylolisthesis of lumbar spine. She is currently managed on gabapentin, Ibuprofen, and Tramadol (PRN). History of cervical fusion in 2001, lumbar fusion to L4-5 in September 2016 and Lumbar fusion to L2-3 in 2017. She continues to have numbness and pain to her lower back and numbness to her lower extremities. She is currently following with Dr. Ellene Route and will be undergoing MRI soon. She uses a walker daily given poor mobility secondary to back issues.  2) Elevated Blood Pressure Reading: BP in the office today of 164/94. Several documented readings above goal found in chart. She endorses a history of elevated readings in MD offices. She checks her BP at home and gets readings of 120-170's/60-80. She has not checked her BP at home in months.  3) Type 2 Diabetes: A1C of 6.8 in September 2016. She does have chronic numbness to her hands. She's not had recent A1C check. She's never been put on medication for diabetes.   4) Hyperlipidemia: History of elevated readings in the past. It was recommended she start taking a statin but she refused as she thought this  Would cause an increase in her blood sugars. She does take Fish Oil daily.   Review of Systems  Eyes: Negative for visual disturbance.  Respiratory: Negative for shortness of breath.   Cardiovascular: Negative for chest pain.  Musculoskeletal: Positive for back pain.  Neurological: Positive for numbness. Negative for dizziness.       Past Medical History:  Diagnosis Date  . Arthritis   . Borderline diabetic   . GERD (gastroesophageal reflux disease)   . History of bronchitis 2005  . History of kidney stones   .  History of shingles   . Hyperlipidemia    takes Fish Oil daily  . Neuromuscular disorder (HCC)    tingling toes  . Pneumonia 2009  . PONV (postoperative nausea and vomiting)   . Restless leg   . Type 2 diabetes mellitus ([place])   . Weakness    numbness and tingling in both feet r/t back     Social History   Social History  . Marital status: Married    Spouse name: N/A  . Number of children: N/A  . Years of education: N/A   Occupational History  . Not on file.   Social History Main Topics  . Smoking status: Former Smoker    Packs/day: 1.50    Types: Cigarettes    Quit date: 05/13/2006  . Smokeless tobacco: Never Used  . Alcohol use 0.0 oz/week     Comment: once in a while  . Drug use: No  . Sexual activity: Not on file   Other Topics Concern  . Not on file   Social History Narrative   Married.   1 child, 1 grandchildren.   Retired. Once worked for [employer].   Enjoys reading.     Past Surgical History:  Procedure Laterality Date  . ABDOMINAL HYSTERECTOMY  1979  . BACK SURGERY  2016   fusion  . blADder tacked   1991  . CERVICAL FUSION     2001  . HERNIA REPAIR  1991  .  TUMOR EXCISION     WERTHIN'S TUMORS BOTH SIDES OF NECK    Family History  Problem Relation Age of Onset  . Diabetes Mother   . Dementia Mother   . Cirrhosis Mother     Non alcoholic  . COPD Father   . Diabetes Brother     Allergies  Allergen Reactions  . Shellfish Allergy Anaphylaxis, Swelling and Other (See Comments)    Tongue swells  . Oxycodone Nausea Only  . Penicillins Rash    Has patient had a PCN reaction causing immediate rash, facial/tongue/throat swelling, SOB or lightheadedness with hypotension: {no Has patient had a PCN reaction causing severe rash involving mucus membranes or skin necrosis: rash Has patient had a PCN reaction that required hospitalization {no Has patient had a PCN reaction occurring within the last 10 years: {yes If all of the above answers are  "NO", then may proceed with Cephalosporin use.    Current Outpatient Prescriptions on File Prior to Visit  Medication Sig Dispense Refill  . cholecalciferol (VITAMIN D) 1000 units tablet Take 2,000 Units by mouth daily.    . Coenzyme Q10 (COQ10) 100 MG CAPS Take 1 capsule by mouth daily.    Marland Kitchen CRANBERRY PO Take 168 mg by mouth daily.    . fexofenadine (ALLEGRA) 180 MG tablet Take 180 mg by mouth daily.    Marland Kitchen gabapentin (NEURONTIN) 300 MG capsule Take 300 mg by mouth 3 (three) times daily.    . Multiple Vitamin (MULTIVITAMIN WITH MINERALS) TABS tablet Take 1 tablet by mouth daily.    . Omega-3 Fatty Acids (FISH OIL) 1200 MG CAPS Take 1 capsule by mouth 2 (two) times daily.    . traMADol (ULTRAM) 50 MG tablet Take 50 mg by mouth every 6 (six) hours as needed for moderate pain.    Marland Kitchen aspirin 81 MG tablet Take 81 mg by mouth daily.    Marland Kitchen ibuprofen (ADVIL,MOTRIN) 200 MG tablet Take 400-600 mg by mouth every 6 (six) hours as needed for moderate pain.     No current facility-administered medications on file prior to visit.     BP (!) 164/94   Pulse 73   Temp 98.1 F (36.7 C) (Oral)   Ht '5\' 8"'$  (1.727 m)   Wt 164 lb 1.9 oz (74.4 kg)   SpO2 97%   BMI 24.95 kg/m    Objective:   Physical Exam  Constitutional: She appears well-nourished.  Neck: Neck supple.  Cardiovascular: Normal rate and regular rhythm.   Pulmonary/Chest: Effort normal and breath sounds normal.  Skin: Skin is warm and dry.  Psychiatric: She has a normal mood and affect.          Assessment & Plan:

## 2016-11-03 ENCOUNTER — Other Ambulatory Visit (INDEPENDENT_AMBULATORY_CARE_PROVIDER_SITE_OTHER): Payer: BLUE CROSS/BLUE SHIELD

## 2016-11-03 DIAGNOSIS — E785 Hyperlipidemia, unspecified: Secondary | ICD-10-CM | POA: Diagnosis not present

## 2016-11-03 DIAGNOSIS — E119 Type 2 diabetes mellitus without complications: Secondary | ICD-10-CM | POA: Diagnosis not present

## 2016-11-03 LAB — LIPID PANEL
CHOLESTEROL: 228 mg/dL — AB (ref 0–200)
HDL: 53.2 mg/dL (ref >39.00)
LDL Cholesterol: 150 mg/dL — ABNORMAL HIGH (ref 0–99)
NONHDL: 174.59
TRIGLYCERIDES: 125 mg/dL (ref 0.0–149.0)
Total CHOL/HDL Ratio: 4
VLDL: 25 mg/dL (ref 0.0–40.0)

## 2016-11-03 LAB — COMPREHENSIVE METABOLIC PANEL
ALK PHOS: 93 U/L (ref 39–117)
ALT: 20 U/L (ref 0–35)
AST: 20 U/L (ref 0–37)
Albumin: 4.3 g/dL (ref 3.5–5.2)
BILIRUBIN TOTAL: 0.5 mg/dL (ref 0.2–1.2)
BUN: 14 mg/dL (ref 6–23)
CO2: 29 meq/L (ref 19–32)
Calcium: 9.6 mg/dL (ref 8.4–10.5)
Chloride: 104 mEq/L (ref 96–112)
Creatinine, Ser: 0.86 mg/dL (ref 0.40–1.20)
GFR: 70.71 mL/min (ref >60.00)
GLUCOSE: 136 mg/dL — AB (ref 70–99)
Potassium: 4.5 mEq/L (ref 3.5–5.1)
SODIUM: 139 meq/L (ref 135–145)
TOTAL PROTEIN: 7.4 g/dL (ref 6.0–8.3)

## 2016-11-03 LAB — HEMOGLOBIN A1C: HEMOGLOBIN A1C: 6.8 % — AB (ref 4.6–6.5)

## 2016-11-03 LAB — MICROALBUMIN / CREATININE URINE RATIO
Creatinine,U: 94.2 mg/dL
MICROALB/CREAT RATIO: 1.4 mg/g (ref 0.0–30.0)
Microalb, Ur: 1.3 mg/dL (ref 0.0–1.9)

## 2016-11-04 ENCOUNTER — Encounter: Payer: Self-pay | Admitting: *Deleted

## 2016-11-05 ENCOUNTER — Other Ambulatory Visit: Payer: Self-pay | Admitting: Primary Care

## 2016-11-05 DIAGNOSIS — E785 Hyperlipidemia, unspecified: Secondary | ICD-10-CM

## 2016-11-05 DIAGNOSIS — E119 Type 2 diabetes mellitus without complications: Secondary | ICD-10-CM

## 2016-11-05 MED ORDER — ATORVASTATIN CALCIUM 20 MG PO TABS: 20.0000 mg | ORAL_TABLET | Freq: Every evening | ORAL | 3 refills | Status: DC

## 2016-11-05 MED ORDER — METFORMIN HCL 500 MG PO TABS: ORAL_TABLET | ORAL | 0 refills | Status: DC

## 2016-11-16 ENCOUNTER — Telehealth: Payer: Self-pay | Admitting: Primary Care

## 2016-11-16 NOTE — Telephone Encounter (Signed)
-----   Message from Pleas Koch, NP sent at 11/02/2016 10:46 AM EST ----- Regarding: Check BP What are her blood pressures running?

## 2016-11-16 NOTE — Telephone Encounter (Signed)
Message left for patient to return my call.  

## 2016-11-18 NOTE — Telephone Encounter (Signed)
Noted  

## 2016-11-18 NOTE — Telephone Encounter (Signed)
Spoken to patient. She stated that blood pressure is doing good. Blood pressure is running around 140-130 over 70-60. Today, patient's blood pressure was 125/65.

## 2016-12-14 ENCOUNTER — Ambulatory Visit (INDEPENDENT_AMBULATORY_CARE_PROVIDER_SITE_OTHER): Payer: BLUE CROSS/BLUE SHIELD | Admitting: Primary Care

## 2016-12-14 ENCOUNTER — Encounter: Payer: Self-pay | Admitting: Primary Care

## 2016-12-14 VITALS — BP 154/90 | HR 86 | Temp 98.6°F | Ht 68.0 in | Wt 162.4 lb

## 2016-12-14 DIAGNOSIS — R600 Localized edema: Secondary | ICD-10-CM | POA: Diagnosis not present

## 2016-12-14 DIAGNOSIS — M4316 Spondylolisthesis, lumbar region: Secondary | ICD-10-CM | POA: Diagnosis not present

## 2016-12-14 DIAGNOSIS — I10 Essential (primary) hypertension: Secondary | ICD-10-CM

## 2016-12-14 MED ORDER — LISINOPRIL 10 MG PO TABS: 10.0000 mg | ORAL_TABLET | Freq: Every day | ORAL | 0 refills | Status: DC

## 2016-12-14 NOTE — Progress Notes (Signed)
Subjective:    Patient ID: Megan [name], female    DOB: [birth year], 66 y.o.   MRN: [id number]  HPI  Megan [name] is a 66 year old female with a history of chronic back pain, type 2 diabetes, and spondylolistheses of lumbar spine who presents today with a chief complaint of lower extremity edema.  Her edema is located to the bilateral feet. This has been present for the past 1 month which was intermittent in nature initially. Over the past 2 weeks she's noticed more consistent edema to her feet. She denies chest pain, cough, shortness of breath. She is now in a scooter chair and is immobile most of the day. Her edema is improved in the morning and worse throughout the day. She has noticed a bluish discoloration and cool sensation to her feet that will improve with ambulation.  She also reports numbness to her right lower extremity which has been present chronically. She's currently following with neurosurgery and has undergone MRI and EMG testing. She is due for follow up tomorrow with neurology. She would like a second opinion in regards to her lower extremity numbness as now her left side Is starting to become numb.   2) Essential Hypertension: Blood pressure in the office today of 162/94 and 164/94 in late February 2018. She does have several documented elevated readings in 2017 and 2016 and is currently not on antihypertensives. She was checking her BP at home and getting readings of 130-140/60-70's. She denies chest pain, shortness of breath, headaches. She has experienced lower extremity edema.  Review of Systems  Respiratory: Negative for shortness of breath.   Cardiovascular: Positive for leg swelling. Negative for chest pain.  Musculoskeletal: Positive for back pain.  Neurological: Positive for numbness. Negative for headaches.       Past Medical History:  Diagnosis Date  . Arthritis   . Borderline diabetic   . GERD (gastroesophageal reflux disease)   . History of bronchitis  2005  . History of kidney stones   . History of shingles   . Hyperlipidemia    takes Fish Oil daily  . Neuromuscular disorder (HCC)    tingling toes  . Pneumonia 2009  . PONV (postoperative nausea and vomiting)   . Restless leg   . Type 2 diabetes mellitus ([place])   . Weakness    numbness and tingling in both feet r/t back     Social History   Social History  . Marital status: Married    Spouse name: N/A  . Number of children: N/A  . Years of education: N/A   Occupational History  . Not on file.   Social History Main Topics  . Smoking status: Former Smoker    Packs/day: 1.50    Types: Cigarettes    Quit date: 05/13/2006  . Smokeless tobacco: Never Used  . Alcohol use 0.0 oz/week     Comment: once in a while  . Drug use: No  . Sexual activity: Not on file   Other Topics Concern  . Not on file   Social History Narrative   Married.   1 child, 1 grandchildren.   Retired. Once worked for [employer].   Enjoys reading.     Past Surgical History:  Procedure Laterality Date  . ABDOMINAL HYSTERECTOMY  1979  . blADder tacked   1991  . CERVICAL FUSION     2001  . HERNIA REPAIR  1991  . LUMBAR FUSION  2016   L4-5  .  LUMBAR FUSION  2017   L2-3  . TONSILLECTOMY AND ADENOIDECTOMY  1959  . TUMOR EXCISION     WERTHIN'S TUMORS BOTH SIDES OF NECK    Family History  Problem Relation Age of Onset  . Diabetes Mother   . Dementia Mother   . Cirrhosis Mother     Non alcoholic  . COPD Father   . Diabetes Brother     Allergies  Allergen Reactions  . Shellfish Allergy Anaphylaxis, Swelling and Other (See Comments)    Tongue swells  . Oxycodone Nausea Only  . Penicillins Rash    Has patient had a PCN reaction causing immediate rash, facial/tongue/throat swelling, SOB or lightheadedness with hypotension: {no Has patient had a PCN reaction causing severe rash involving mucus membranes or skin necrosis: rash Has patient had a PCN reaction that required hospitalization  {no Has patient had a PCN reaction occurring within the last 10 years: {yes If all of the above answers are "NO", then may proceed with Cephalosporin use.    Current Outpatient Prescriptions on File Prior to Visit  Medication Sig Dispense Refill  . aspirin 81 MG tablet Take 81 mg by mouth daily.    Marland Kitchen atorvastatin (LIPITOR) 20 MG tablet Take 1 tablet (20 mg total) by mouth every evening. 90 tablet 3  . cholecalciferol (VITAMIN D) 1000 units tablet Take 2,000 Units by mouth daily.    . Coenzyme Q10 (COQ10) 100 MG CAPS Take 1 capsule by mouth daily.    Marland Kitchen CRANBERRY PO Take 168 mg by mouth daily.    . fexofenadine (ALLEGRA) 180 MG tablet Take 180 mg by mouth daily.    Marland Kitchen gabapentin (NEURONTIN) 300 MG capsule Take 300 mg by mouth 3 (three) times daily.    Marland Kitchen ibuprofen (ADVIL,MOTRIN) 200 MG tablet Take 400-600 mg by mouth every 6 (six) hours as needed for moderate pain.    . metFORMIN (GLUCOPHAGE) 500 MG tablet Take 1 tablet by mouth once daily with food for 2 weeks, then advance to 1 tablet twice daily with food. 180 tablet 0  . Multiple Vitamin (MULTIVITAMIN WITH MINERALS) TABS tablet Take 1 tablet by mouth daily.    . Omega-3 Fatty Acids (FISH OIL) 1200 MG CAPS Take 1 capsule by mouth 2 (two) times daily.    . traMADol (ULTRAM) 50 MG tablet Take 50 mg by mouth every 6 (six) hours as needed for moderate pain.     No current facility-administered medications on file prior to visit.     BP (!) 154/90   Pulse 86   Temp 98.6 F (37 C) (Oral)   Ht '5\' 8"'$  (1.727 m)   Wt 162 lb 6.4 oz (73.7 kg)   SpO2 98%   BMI 24.69 kg/m    Objective:   Physical Exam  Cardiovascular: Normal rate, regular rhythm and normal heart sounds.   Pulses:      Dorsalis pedis pulses are 1+ on the right side, and 1+ on the left side.       Posterior tibial pulses are 1+ on the right side, and 1+ on the left side.  2+ edema to bilateral feet, none to ankles. Slight bluish discoloration to toes. Feet cool to touch.    Pulmonary/Chest: Effort normal and breath sounds normal. She has no wheezes. She has no rales.  Skin: Skin is warm and dry.          Assessment & Plan:

## 2016-12-14 NOTE — Progress Notes (Signed)
Pre visit review using our clinic review tool, if applicable. No additional management support is needed unless otherwise documented below in the visit note. 

## 2016-12-14 NOTE — Assessment & Plan Note (Signed)
Bilaterally. Moderate. Pulses intact. Highly suspect this is secondary to sedentary lifestyle as symptoms improve with ambulation and elevation. Will have her purchase compression socks, elevate extremities at rest, report back if no improvement.

## 2016-12-14 NOTE — Patient Instructions (Addendum)
Start lisinopril 10 mg for high blood pressure. Take 1 tablet by mouth once daily.  Check your blood pressure daily, around the same time of day, for the next 3 weeks.  Ensure that you have rested for 30 minutes prior to checking your blood pressure. Record your readings and bring them to your next visit.  You must elevate your legs at the level of your heart in order to reduce swelling. You must try to get up every hour to help with circulation to your feet. Your swelling is coming from inactivity.   Purchase some compression stockings to wear during the day for swelling..  Stop by the front desk and speak with either Rosaria Ferries or Shirlean Mylar regarding your referral to Neurosurgery.  Follow up in our clinic in three weeks for re-evaluation of blood pressure.  It was a pleasure to see you today!

## 2016-12-14 NOTE — Assessment & Plan Note (Signed)
Continued pain despite surgery. Continued numbness.  Referral placed to neurosurgery for second opinion.

## 2016-12-14 NOTE — Assessment & Plan Note (Signed)
Above goal on today's visit, home readings above goal. Treat with Lisinopril 10 mg for BP and renal protection. Follow up in 3 weeks for BP check and BMP

## 2016-12-15 ENCOUNTER — Ambulatory Visit (INDEPENDENT_AMBULATORY_CARE_PROVIDER_SITE_OTHER): Payer: BLUE CROSS/BLUE SHIELD | Admitting: Neurology

## 2016-12-15 ENCOUNTER — Encounter: Payer: Self-pay | Admitting: Neurology

## 2016-12-15 ENCOUNTER — Encounter (INDEPENDENT_AMBULATORY_CARE_PROVIDER_SITE_OTHER): Payer: Self-pay

## 2016-12-15 DIAGNOSIS — G8220 Paraplegia, unspecified: Secondary | ICD-10-CM | POA: Diagnosis not present

## 2016-12-15 HISTORY — DX: Paraplegia, unspecified: G82.20

## 2016-12-15 MED ORDER — DEXAMETHASONE 2 MG PO TABS
8.0000 mg | ORAL_TABLET | Freq: Every day | ORAL | 0 refills | Status: DC
Start: 2016-12-15 — End: 2016-12-18

## 2016-12-15 NOTE — Patient Instructions (Signed)
   We will check MRI of the neck and mid back

## 2016-12-15 NOTE — Progress Notes (Signed)
Reason for visit: Gait disorder  Referring physician: Dr. Lorrene Reid is a 66 y.o. female  History of present illness:  [name] is a 66 year old right-handed white female with a history of cervical and lumbosacral spine surgery in the past. The patient has been followed by Dr. Joya Salm who has now retired. The patient initially had lumbosacral spine surgery in September 2016. The patient did well until May 2017 when she began noting some numbness in the right hip that was gradually spreading down into the right leg and thigh. The patient underwent a myelogram and was felt to have nerve root impingement at the L2, L3, and L4 levels on the right. The patient underwent surgery in August 2017 with a fusion of the site and initially seemed to be doing fairly well. The patient was able to ambulate with a cane, but towards the end of January 2018 she began having some problem with walking that was worsening, associated with falls. The patient had to convert to using a walker, she was dragging the right leg, but the left leg would support her. Within the last 2 weeks, the patient has lost her ability to ambulate completely as the left leg has also become weak and has become somewhat numb as well. There is no actual discomfort in either leg, but the legs will jerk and twitch at night, the patient is unable to control these movements. The patient denies any problems controlling the bowels or the bladder. She denies any numbness on the body, the numbness begins at the top of the hip and goes down the right leg and is in the left foot. The patient has not had any neck discomfort or any problems with the arms whatsoever. She denies any headaches, vision changes, changes in memory or concentration, or difficulty with speech or swallowing. The patient is sent to this office for an evaluation. The patient was to be set up for MRI evaluation of the cervical spine, but the patient has not yet heard  anything about the study. EMG and nerve conduction study evaluation of the lower extremities has been unremarkable. This was done by Dr. Brien Few.  Past Medical History:  Diagnosis Date  . Arthritis   . Borderline diabetic   . GERD (gastroesophageal reflux disease)   . History of bronchitis 2005  . History of kidney stones   . History of shingles   . Hyperlipidemia    takes Fish Oil daily  . Neuromuscular disorder (HCC)    tingling toes  . 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 Pneumonia 2009  . PONV (postoperative nausea and vomiting)   . Restless leg   . Type 2 diabetes mellitus ([place])   . Weakness    numbness and tingling in both feet r/t back    Past Surgical History:  Procedure Laterality Date  . ABDOMINAL HYSTERECTOMY  1979  . blADder tacked   1991  . CERVICAL FUSION     2001  . HERNIA REPAIR  1991  . LUMBAR FUSION  2016   L4-5  . LUMBAR FUSION  2017   L2-3  . TONSILLECTOMY AND ADENOIDECTOMY  1959  . TUMOR EXCISION     WERTHIN'S TUMORS BOTH SIDES OF NECK    Family History  Problem Relation Age of Onset  . Diabetes Mother   . Dementia Mother   . Cirrhosis Mother     Non alcoholic  . COPD Father   . Diabetes Brother  Social history:  reports that she quit smoking about 10 years ago. Her smoking use included Cigarettes. She smoked 1.50 packs per day. She has never used smokeless tobacco. She reports that she drinks alcohol. She reports that she does not use drugs.  Medications:  Prior to Admission medications   Medication Sig Start Date End Date Taking? Authorizing Provider  aspirin 81 MG tablet Take 81 mg by mouth daily.   Yes Historical Provider, MD  atorvastatin (LIPITOR) 20 MG tablet Take 1 tablet (20 mg total) by mouth every evening. 11/05/16  Yes Pleas Koch, NP  cholecalciferol (VITAMIN D) 1000 units tablet Take 2,000 Units by mouth daily.   Yes Historical Provider, MD  Coenzyme Q10 (COQ10) 100 MG CAPS Take 1 capsule by mouth daily.    Yes Historical Provider, MD  CRANBERRY PO Take 168 mg by mouth daily.   Yes Historical Provider, MD  fexofenadine (ALLEGRA) 180 MG tablet Take 180 mg by mouth daily.   Yes Historical Provider, MD  gabapentin (NEURONTIN) 300 MG capsule Take 300 mg by mouth 3 (three) times daily.   Yes Historical Provider, MD  ibuprofen (ADVIL,MOTRIN) 200 MG tablet Take 400-600 mg by mouth every 6 (six) hours as needed for moderate pain.   Yes Historical Provider, MD  lisinopril (PRINIVIL,ZESTRIL) 10 MG tablet Take 1 tablet (10 mg total) by mouth daily. 12/14/16  Yes Pleas Koch, NP  metFORMIN (GLUCOPHAGE) 500 MG tablet Take 1 tablet by mouth once daily with food for 2 weeks, then advance to 1 tablet twice daily with food. 11/05/16  Yes Pleas Koch, NP  Multiple Vitamin (MULTIVITAMIN WITH MINERALS) TABS tablet Take 1 tablet by mouth daily.   Yes Historical Provider, MD  Omega-3 Fatty Acids (FISH OIL) 1200 MG CAPS Take 1 capsule by mouth 2 (two) times daily.   Yes Historical Provider, MD  traMADol (ULTRAM) 50 MG tablet Take 50 mg by mouth every 6 (six) hours as needed for moderate pain.   Yes Historical Provider, MD  dexamethasone (DECADRON) 2 MG tablet Take 4 tablets (8 mg total) by mouth daily. 12/15/16   Kathrynn Ducking, MD      Allergies  Allergen Reactions  . Shellfish Allergy Anaphylaxis, Swelling and Other (See Comments)    Tongue swells  . Oxycodone Nausea Only  . Penicillins Rash    Has patient had a PCN reaction causing immediate rash, facial/tongue/throat swelling, SOB or lightheadedness with hypotension: {no Has patient had a PCN reaction causing severe rash involving mucus membranes or skin necrosis: rash Has patient had a PCN reaction that required hospitalization {no Has patient had a PCN reaction occurring within the last 10 years: {yes If all of the above answers are "NO", then may proceed with Cephalosporin use.    ROS:  Out of a complete 14 system review of symptoms, the patient  complains only of the following symptoms, and all other reviewed systems are negative.  Constipation Allergies Numbness, weakness Restless legs  Blood pressure (!) 162/92, pulse 89, height '5\' 8"'$  (1.727 m), SpO2 96 %.  Physical Exam  General: The patient is alert and cooperative at the time of the examination.  Eyes: Pupils are equal, round, and reactive to light. Discs are flat bilaterally.  Neck: The neck is supple, no carotid bruits are noted.  Respiratory: The respiratory examination is clear.  Cardiovascular: The cardiovascular examination reveals a regular rate and rhythm, no obvious murmurs or rubs are noted.  Skin: Extremities are with 1+ edema  on the right leg, trace on the left.  Neurologic Exam  Mental status: The patient is alert and oriented x 3 at the time of the examination. The patient has apparent normal recent and remote memory, with an apparently normal attention span and concentration ability.  Cranial nerves: Facial symmetry is present. There is good sensation of the face to pinprick and soft touch bilaterally. The strength of the facial muscles and the muscles to head turning and shoulder shrug are normal bilaterally. Speech is well enunciated, no aphasia or dysarthria is noted. Extraocular movements are full. Visual fields are full. The tongue is midline, and the patient has symmetric elevation of the soft palate. No obvious hearing deficits are noted.  Motor: The motor testing reveals 5 over 5 strength of the upper extremities. With the lower extremity is, the patient has 2/5 strength with hip flexion on the right, 2/5 on the left. The patient has 2/5 strength with knee extension and flexion on the right, 3/5 on the left. The patient has bilateral foot drops, the patient has plantar flexion and inversion of the right foot.   Sensory: Sensory testing is intact to pinprick, soft touch, vibration sensation, and position sense on the upper extremities. With the lower  extremities, there are areas on the right thigh and lateral right leg better anesthetic to pinprick, the pinprick sensation is diffusely decreased on the right leg as compared to the left. Position sensation is decreased on both feet, vibration sensation is decreased on the right foot relative to the left. No evidence of extinction is noted. No evidence of a sensory level was noted on the body.  Coordination: Cerebellar testing reveals good finger-nose-finger bilaterally. The patient is not able to effectively perform heel-to-shin on either side.  Gait and station: The patient could not be ambulated, she cannot bear weight on either leg.   Reflexes: Deep tendon reflexes are symmetric, not elevated in the arms or legs, no ankle clonus is seen on either side. Toes are upgoing bilaterally.   Assessment/Plan:  1. Progressive spastic paraparesis   The patient is rapidly losing the ability to use the legs, she has not been ambulatory in 2 weeks. The patient appears to have developed a spastic paraparesis that may be related to spinal cord compression. The patient has full normal function of both arms, we will check MRI of the cervical spine and thoracic spine, the patient will be placed on Decadron, 8 mg daily. If we cannot get MRI evaluation done within the next 24-48 hours, I will instruct the patient to go to the emergency room.   Jill Alexanders MD 12/15/2016 4:18 PM  [facility] [address] [place], [place] [address]  Phone [phone] Fax [phone]

## 2016-12-16 ENCOUNTER — Telehealth: Payer: Self-pay | Admitting: Neurology

## 2016-12-16 NOTE — Telephone Encounter (Signed)
Patient is scheduled at [facility] for Saturday 12/18/16 at 4:00 pm.

## 2016-12-16 NOTE — Telephone Encounter (Signed)
Thank you, noted.

## 2016-12-18 ENCOUNTER — Encounter (HOSPITAL_COMMUNITY): Payer: Self-pay | Admitting: Nurse Practitioner

## 2016-12-18 ENCOUNTER — Telehealth: Payer: Self-pay | Admitting: Neurology

## 2016-12-18 ENCOUNTER — Inpatient Hospital Stay (HOSPITAL_COMMUNITY)
Admission: EM | Admit: 2016-12-18 | Discharge: 2016-12-21 | DRG: 460 | Disposition: A | Discharge status: Rehabilitation Facility | Payer: BLUE CROSS/BLUE SHIELD | Attending: Neurological Surgery | Admitting: Neurological Surgery

## 2016-12-18 ENCOUNTER — Inpatient Hospital Stay (HOSPITAL_COMMUNITY): Payer: BLUE CROSS/BLUE SHIELD

## 2016-12-18 ENCOUNTER — Ambulatory Visit (HOSPITAL_COMMUNITY)
Admission: RE | Admit: 2016-12-18 | Discharge: 2016-12-18 | Disposition: A | Discharge status: Home / Self Care | Payer: BLUE CROSS/BLUE SHIELD | Source: Ambulatory Visit | Attending: Neurology | Admitting: Neurology

## 2016-12-18 DIAGNOSIS — M4714 Other spondylosis with myelopathy, thoracic region: Secondary | ICD-10-CM | POA: Diagnosis present

## 2016-12-18 DIAGNOSIS — R531 Weakness: Secondary | ICD-10-CM | POA: Diagnosis present

## 2016-12-18 DIAGNOSIS — Z79899 Other long term (current) drug therapy: Secondary | ICD-10-CM

## 2016-12-18 DIAGNOSIS — Z90710 Acquired absence of both cervix and uterus: Secondary | ICD-10-CM

## 2016-12-18 DIAGNOSIS — K5900 Constipation, unspecified: Secondary | ICD-10-CM | POA: Diagnosis present

## 2016-12-18 DIAGNOSIS — I10 Essential (primary) hypertension: Secondary | ICD-10-CM | POA: Diagnosis present

## 2016-12-18 DIAGNOSIS — Z981 Arthrodesis status: Secondary | ICD-10-CM | POA: Diagnosis not present

## 2016-12-18 DIAGNOSIS — K592 Neurogenic bowel, not elsewhere classified: Secondary | ICD-10-CM | POA: Diagnosis not present

## 2016-12-18 DIAGNOSIS — Z7982 Long term (current) use of aspirin: Secondary | ICD-10-CM

## 2016-12-18 DIAGNOSIS — Z880 Allergy status to penicillin: Secondary | ICD-10-CM | POA: Diagnosis not present

## 2016-12-18 DIAGNOSIS — G8220 Paraplegia, unspecified: Secondary | ICD-10-CM | POA: Diagnosis present

## 2016-12-18 DIAGNOSIS — N3281 Overactive bladder: Secondary | ICD-10-CM | POA: Diagnosis present

## 2016-12-18 DIAGNOSIS — R200 Anesthesia of skin: Secondary | ICD-10-CM | POA: Diagnosis present

## 2016-12-18 DIAGNOSIS — K219 Gastro-esophageal reflux disease without esophagitis: Secondary | ICD-10-CM | POA: Diagnosis present

## 2016-12-18 DIAGNOSIS — N319 Neuromuscular dysfunction of bladder, unspecified: Secondary | ICD-10-CM | POA: Diagnosis not present

## 2016-12-18 DIAGNOSIS — E785 Hyperlipidemia, unspecified: Secondary | ICD-10-CM | POA: Diagnosis present

## 2016-12-18 DIAGNOSIS — Z419 Encounter for procedure for purposes other than remedying health state, unspecified: Secondary | ICD-10-CM

## 2016-12-18 DIAGNOSIS — M7989 Other specified soft tissue disorders: Secondary | ICD-10-CM | POA: Diagnosis not present

## 2016-12-18 DIAGNOSIS — G8918 Other acute postprocedural pain: Secondary | ICD-10-CM | POA: Diagnosis not present

## 2016-12-18 DIAGNOSIS — M792 Neuralgia and neuritis, unspecified: Secondary | ICD-10-CM

## 2016-12-18 DIAGNOSIS — Z7984 Long term (current) use of oral hypoglycemic drugs: Secondary | ICD-10-CM

## 2016-12-18 DIAGNOSIS — Z91013 Allergy to seafood: Secondary | ICD-10-CM | POA: Diagnosis not present

## 2016-12-18 DIAGNOSIS — D62 Acute posthemorrhagic anemia: Secondary | ICD-10-CM | POA: Diagnosis not present

## 2016-12-18 DIAGNOSIS — M5104 Intervertebral disc disorders with myelopathy, thoracic region: Secondary | ICD-10-CM | POA: Diagnosis not present

## 2016-12-18 DIAGNOSIS — E1140 Type 2 diabetes mellitus with diabetic neuropathy, unspecified: Secondary | ICD-10-CM | POA: Diagnosis present

## 2016-12-18 DIAGNOSIS — Z87891 Personal history of nicotine dependence: Secondary | ICD-10-CM | POA: Diagnosis not present

## 2016-12-18 DIAGNOSIS — Z885 Allergy status to narcotic agent status: Secondary | ICD-10-CM

## 2016-12-18 DIAGNOSIS — E1165 Type 2 diabetes mellitus with hyperglycemia: Secondary | ICD-10-CM | POA: Diagnosis not present

## 2016-12-18 DIAGNOSIS — G959 Disease of spinal cord, unspecified: Secondary | ICD-10-CM | POA: Diagnosis not present

## 2016-12-18 DIAGNOSIS — E119 Type 2 diabetes mellitus without complications: Secondary | ICD-10-CM | POA: Diagnosis not present

## 2016-12-18 DIAGNOSIS — N312 Flaccid neuropathic bladder, not elsewhere classified: Secondary | ICD-10-CM | POA: Diagnosis not present

## 2016-12-18 HISTORY — DX: Other spondylosis with myelopathy, thoracic region: M47.14

## 2016-12-18 LAB — COMPREHENSIVE METABOLIC PANEL
ALT: 30 U/L (ref 14–54)
AST: 28 U/L (ref 15–41)
Albumin: 3.9 g/dL (ref 3.5–5.0)
Alkaline Phosphatase: 95 U/L (ref 38–126)
Anion gap: 13 (ref 5–15)
BUN: 11 mg/dL (ref 6–20)
CO2: 26 mmol/L (ref 22–32)
Calcium: 9.7 mg/dL (ref 8.9–10.3)
Chloride: 102 mmol/L (ref 101–111)
Creatinine, Ser: 0.73 mg/dL (ref 0.44–1.00)
GFR calc Af Amer: >60 mL/min (ref >60)
GFR calc non Af Amer: >60 mL/min (ref >60)
Glucose, Bld: 170 mg/dL — ABNORMAL HIGH (ref 65–99)
Potassium: 4 mmol/L (ref 3.5–5.1)
Sodium: 141 mmol/L (ref 135–145)
Total Bilirubin: 0.6 mg/dL (ref 0.3–1.2)
Total Protein: 7.3 g/dL (ref 6.5–8.1)

## 2016-12-18 MED ORDER — VITAMIN D 1000 UNITS PO TABS
2000.0000 [IU] | ORAL_TABLET | Freq: Every day | ORAL | Status: DC
  Administered 2016-12-20 – 2016-12-21 (×2): 2000 [IU] via ORAL
  Filled 2016-12-18 (×2): qty 2

## 2016-12-18 MED ORDER — DEXAMETHASONE SODIUM PHOSPHATE 10 MG/ML IJ SOLN
10.0000 mg | Freq: Once | INTRAMUSCULAR | Status: AC
  Administered 2016-12-18: 10 mg via INTRAVENOUS
  Filled 2016-12-18: qty 1

## 2016-12-18 MED ORDER — ACETAMINOPHEN 325 MG PO TABS
650.0000 mg | ORAL_TABLET | Freq: Four times a day (QID) | ORAL | Status: DC | PRN
  Filled 2016-12-18: qty 2

## 2016-12-18 MED ORDER — LORATADINE 10 MG PO TABS: 10.0000 mg | ORAL_TABLET | Freq: Every day | ORAL | Status: DC

## 2016-12-18 MED ORDER — HYDROCODONE-ACETAMINOPHEN 5-325 MG PO TABS
1.0000 | ORAL_TABLET | ORAL | Status: DC | PRN
  Administered 2016-12-18: 1 via ORAL
  Filled 2016-12-18: qty 1

## 2016-12-18 MED ORDER — DOCUSATE SODIUM 100 MG PO CAPS
100.0000 mg | ORAL_CAPSULE | Freq: Two times a day (BID) | ORAL | Status: DC
  Administered 2016-12-18: 100 mg via ORAL
  Filled 2016-12-18 (×2): qty 1

## 2016-12-18 MED ORDER — CRANBERRY 250 MG PO TABS: 168.0000 mg | ORAL_TABLET | Freq: Every day | ORAL | Status: DC

## 2016-12-18 MED ORDER — SENNOSIDES-DOCUSATE SODIUM 8.6-50 MG PO TABS: 1.0000 | ORAL_TABLET | Freq: Every evening | ORAL | Status: DC | PRN

## 2016-12-18 MED ORDER — SODIUM CHLORIDE 0.9% FLUSH
3.0000 mL | Freq: Two times a day (BID) | INTRAVENOUS | Status: DC
  Administered 2016-12-18 – 2016-12-19 (×2): 3 mL via INTRAVENOUS

## 2016-12-18 MED ORDER — SODIUM CHLORIDE 0.9% FLUSH: 3.0000 mL | INTRAVENOUS | Status: DC | PRN

## 2016-12-18 MED ORDER — BISACODYL 5 MG PO TBEC: 5.0000 mg | DELAYED_RELEASE_TABLET | Freq: Every day | ORAL | Status: DC | PRN

## 2016-12-18 MED ORDER — ADULT MULTIVITAMIN W/MINERALS CH
1.0000 | ORAL_TABLET | Freq: Every day | ORAL | Status: DC
  Administered 2016-12-20 – 2016-12-21 (×2): 1 via ORAL
  Filled 2016-12-18 (×2): qty 1

## 2016-12-18 MED ORDER — PROMETHAZINE HCL 25 MG PO TABS: 12.5000 mg | ORAL_TABLET | Freq: Four times a day (QID) | ORAL | Status: DC | PRN

## 2016-12-18 MED ORDER — SODIUM CHLORIDE 0.9 % IV SOLN
INTRAVENOUS | Status: DC
  Administered 2016-12-18: 22:00:00 via INTRAVENOUS

## 2016-12-18 MED ORDER — METFORMIN HCL 500 MG PO TABS
500.0000 mg | ORAL_TABLET | Freq: Two times a day (BID) | ORAL | Status: DC
  Administered 2016-12-19 – 2016-12-21 (×5): 500 mg via ORAL
  Filled 2016-12-18 (×5): qty 1

## 2016-12-18 MED ORDER — LISINOPRIL 10 MG PO TABS
10.0000 mg | ORAL_TABLET | Freq: Every day | ORAL | Status: DC
  Filled 2016-12-18: qty 1

## 2016-12-18 MED ORDER — GABAPENTIN 300 MG PO CAPS
300.0000 mg | ORAL_CAPSULE | Freq: Three times a day (TID) | ORAL | Status: DC
  Administered 2016-12-18: 300 mg via ORAL
  Filled 2016-12-18 (×2): qty 1

## 2016-12-18 MED ORDER — ACETAMINOPHEN 650 MG RE SUPP: 650.0000 mg | Freq: Four times a day (QID) | RECTAL | Status: DC | PRN

## 2016-12-18 MED ORDER — COQ10 100 MG PO CAPS: 1.0000 | ORAL_CAPSULE | Freq: Every day | ORAL | Status: DC

## 2016-12-18 MED ORDER — DEXAMETHASONE SODIUM PHOSPHATE 4 MG/ML IJ SOLN
6.0000 mg | Freq: Four times a day (QID) | INTRAMUSCULAR | Status: DC
  Administered 2016-12-19 – 2016-12-21 (×10): 6 mg via INTRAVENOUS
  Filled 2016-12-18 (×10): qty 2

## 2016-12-18 MED ORDER — SODIUM CHLORIDE 0.9 % IV SOLN: 250.0000 mL | INTRAVENOUS | Status: DC | PRN

## 2016-12-18 MED ORDER — MAGNESIUM CITRATE PO SOLN: 1.0000 | Freq: Once | ORAL | Status: DC | PRN

## 2016-12-18 MED ORDER — ATORVASTATIN CALCIUM 10 MG PO TABS
20.0000 mg | ORAL_TABLET | Freq: Every evening | ORAL | Status: DC
  Administered 2016-12-19 – 2016-12-21 (×3): 20 mg via ORAL
  Filled 2016-12-18: qty 2
  Filled 2016-12-18 (×4): qty 1

## 2016-12-18 NOTE — ED Notes (Signed)
Pt was instructed to have ED staff call vincent, neurosurgery PA when she arrived to ED

## 2016-12-18 NOTE — Progress Notes (Signed)
Patient admitted into 5C08 with  Scheduled surgery tomorrow. On arrival, verbal and oriented. Family at bedside. Patient and family oriented to the room. Made comfortable in bed. Will keep monitoring.

## 2016-12-18 NOTE — Telephone Encounter (Signed)
I called the patient. MRI of the thoracic spine shows severe cord compression in the lower area. I will try to contact NSGY this weekend.  I contacted the on call PA for [facility], They will contact the patient and get her to the ER.

## 2016-12-18 NOTE — ED Triage Notes (Signed)
Pt presents with c/o numbness to her BLE. The numbness began about 2.5 months ago and has gotten progressively worse since onset. She had an MRI at [facility] long today which showed severe cord compression. Dr Jannifer Franklin called her with MRI results and instructions to come directly to ED for admission and possible surgery this weekend.

## 2016-12-18 NOTE — H&P (Signed)
CC:  Chief Complaint  Patient presents with  . Numbness    HPI: Megan [name] is a 66 y.o. female whom I was called about by Dr Margette Fast (Neurologist) due to an abnormal T-spine MRI at roughly 1730. He advised pt to report to the ER due to findings. I called patient and advised her I would meet her at [facility] for admission.   She reports long standing history of neck/back issues requiring multiple surgeries. Reports end of 2017, started to have right lower back pain with radiation down to right leg. Slowly developed weakness in right leg that required her to ambulate with a walker. She states it has been a slow progression to now where she can no longer ambulate due to weakness in right leg. She states starting several days ago, she started to have weakness in the left leg as well. She reports she is unable to move the RLE. She also endorses decreased sensation of the RLE. She denies bowel or bladder dysfunction. Not on anticoag.   PMH: Past Medical History:  Diagnosis Date  . Arthritis   . Borderline diabetic   . GERD (gastroesophageal reflux disease)   . History of bronchitis 2005  . History of kidney stones   . History of shingles   . Hyperlipidemia    takes Fish Oil daily  . Neuromuscular disorder (HCC)    tingling toes  . 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 Pneumonia 2009  . PONV (postoperative nausea and vomiting)   . Restless leg   . Type 2 diabetes mellitus ([place])   . Weakness    numbness and tingling in both feet r/t back    PSH: Past Surgical History:  Procedure Laterality Date  . ABDOMINAL HYSTERECTOMY  1979  . blADder tacked   1991  . CERVICAL FUSION     2001  . HERNIA REPAIR  1991  . LUMBAR FUSION  2016   L4-5  . LUMBAR FUSION  2017   L2-3  . TONSILLECTOMY AND ADENOIDECTOMY  1959  . TUMOR EXCISION     WERTHIN'S TUMORS BOTH SIDES OF NECK    SH: Social History  Substance Use Topics  . Smoking status: Former Smoker    Packs/day: 1.50     Types: Cigarettes    Quit date: 05/13/2006  . Smokeless tobacco: Never Used  . Alcohol use 0.0 oz/week     Comment: once in a while    MEDS: Prior to Admission medications   Medication Sig Start Date End Date Taking? Authorizing Provider  aspirin 81 MG tablet Take 81 mg by mouth daily.    Historical Provider, MD  atorvastatin (LIPITOR) 20 MG tablet Take 1 tablet (20 mg total) by mouth every evening. 11/05/16   Pleas Koch, NP  cholecalciferol (VITAMIN D) 1000 units tablet Take 2,000 Units by mouth daily.    Historical Provider, MD  Coenzyme Q10 (COQ10) 100 MG CAPS Take 1 capsule by mouth daily.    Historical Provider, MD  CRANBERRY PO Take 168 mg by mouth daily.    Historical Provider, MD  dexamethasone (DECADRON) 2 MG tablet Take 4 tablets (8 mg total) by mouth daily. 12/15/16   Kathrynn Ducking, MD  fexofenadine (ALLEGRA) 180 MG tablet Take 180 mg by mouth daily.    Historical Provider, MD  gabapentin (NEURONTIN) 300 MG capsule Take 300 mg by mouth 3 (three) times daily.    Historical Provider, MD  ibuprofen (ADVIL,MOTRIN) 200 MG tablet Take  400-600 mg by mouth every 6 (six) hours as needed for moderate pain.    Historical Provider, MD  lisinopril (PRINIVIL,ZESTRIL) 10 MG tablet Take 1 tablet (10 mg total) by mouth daily. 12/14/16   Pleas Koch, NP  metFORMIN (GLUCOPHAGE) 500 MG tablet Take 1 tablet by mouth once daily with food for 2 weeks, then advance to 1 tablet twice daily with food. 11/05/16   Pleas Koch, NP  Multiple Vitamin (MULTIVITAMIN WITH MINERALS) TABS tablet Take 1 tablet by mouth daily.    Historical Provider, MD  Omega-3 Fatty Acids (FISH OIL) 1200 MG CAPS Take 1 capsule by mouth 2 (two) times daily.    Historical Provider, MD  traMADol (ULTRAM) 50 MG tablet Take 50 mg by mouth every 6 (six) hours as needed for moderate pain.    Historical Provider, MD    ALLERGY: Allergies  Allergen Reactions  . Shellfish Allergy Anaphylaxis, Swelling and Other (See  Comments)    Tongue swells  . Oxycodone Nausea Only  . Penicillins Rash    Has patient had a PCN reaction causing immediate rash, facial/tongue/throat swelling, SOB or lightheadedness with hypotension: {no Has patient had a PCN reaction causing severe rash involving mucus membranes or skin necrosis: rash Has patient had a PCN reaction that required hospitalization {no Has patient had a PCN reaction occurring within the last 10 years: {yes If all of the above answers are "NO", then may proceed with Cephalosporin use.    ROS: Review of Systems  Constitutional: Positive for malaise/fatigue. Negative for chills and fever.  HENT: Negative.   Eyes: Negative.   Respiratory: Negative.   Cardiovascular: Negative.   Gastrointestinal: Negative for abdominal pain, blood in stool, constipation, diarrhea, melena, nausea and vomiting.  Musculoskeletal: Positive for back pain, joint pain and neck pain. Negative for falls and myalgias.  Neurological: Positive for sensory change (RLE), focal weakness (RLE) and weakness. Negative for dizziness, tingling, tremors, speech change, seizures, loss of consciousness and headaches.    Vitals:   12/18/16 1810  BP: (!) 172/98  Pulse: 98  Resp: 16  Temp: 98.8 F (37.1 C)   General appearance: WDWN, NAD Eyes: PERRL, Fundoscopic: normal Cardiovascular: Regular rate and rhythm without murmurs, rubs, gallops. No edema or variciosities. Distal pulses normal. Pulmonary: Clear to auscultation Musculoskeletal:     Gait:     Muscle tone upper extremities: Normal    Muscle tone lower extremities: Normal    Motor exam: Upper Extremities Deltoid Bicep Tricep Grip  Right 5/5 5/5 5/5 5/5  Left 5/5 5/5 5/5 5/5   Lower Extremity IP Quad PF DF EHL  Right 0/5 0/5 0/5 0/5 0/5  Left 5/5 5/5 4+/5 4+/5 4+/5   Neurological Awake, alert, oriented Memory and concentration grossly intact Speech fluent, appropriate CNII: Visual fields normal CNIII/IV/VI: EOMI CNV:  Facial sensation normal CNVII: Symmetric, normal strength CNVIII: Grossly normal CNIX: Normal palate movement CNXI: Trap and SCM strength normal CN XII: Tongue protrusion normal Sensation grossly intact to LT with exception of RLE DTR: Normal  IMAGING: See MRI C-spine and thoracic spine ordered by Dr Margette Fast  IMPRESSION: - 66 y.o. female with thoracic spinal cord compression. She is no longer able to ambulate secondary to cord compression. Reviewed with Dr Cyndy Freeze who also reviewed MRI. She will need to undergo urgent surgical intervention tomorrow for correction. Surgery discussed: Left T10 costotransversectomy, Left T10-11 discectomy, T9-12 posterior fixation and fusion. Risks and benefits of surgery were discussed at length including alternatives. Pt  stated in her own language, understanding of the procedure. All questions were sought and answered. She would like to proceed with surgery tomorrow. Placed on schedule for tomorrow.

## 2016-12-19 ENCOUNTER — Inpatient Hospital Stay (HOSPITAL_COMMUNITY): Payer: BLUE CROSS/BLUE SHIELD

## 2016-12-19 ENCOUNTER — Inpatient Hospital Stay (HOSPITAL_COMMUNITY): Payer: BLUE CROSS/BLUE SHIELD | Admitting: Anesthesiology

## 2016-12-19 ENCOUNTER — Encounter (HOSPITAL_COMMUNITY): Payer: Self-pay | Admitting: Anesthesiology

## 2016-12-19 ENCOUNTER — Encounter (HOSPITAL_COMMUNITY)
Admission: EM | Disposition: A | Discharge status: Rehabilitation Facility | Payer: Self-pay | Source: Home / Self Care | Attending: Neurological Surgery

## 2016-12-19 HISTORY — PX: APPLICATION OF ROBOTIC ASSISTANCE FOR SPINAL PROCEDURE: SHX6753

## 2016-12-19 LAB — GLUCOSE, CAPILLARY
GLUCOSE-CAPILLARY: 221 mg/dL — AB (ref 65–99)
Glucose-Capillary: 186 mg/dL — ABNORMAL HIGH (ref 65–99)
Glucose-Capillary: 218 mg/dL — ABNORMAL HIGH (ref 65–99)

## 2016-12-19 LAB — MRSA PCR SCREENING: MRSA by PCR: NEGATIVE

## 2016-12-19 LAB — HIV ANTIBODY (ROUTINE TESTING W REFLEX): HIV Screen 4th Generation wRfx: NONREACTIVE

## 2016-12-19 LAB — TYPE AND SCREEN
ABO/RH(D): A POS
Antibody Screen: NEGATIVE

## 2016-12-19 SURGERY — POSTERIOR LUMBAR FUSION 3 LEVEL
Anesthesia: General | Site: Back | Laterality: Left

## 2016-12-19 MED ORDER — MIDAZOLAM HCL 2 MG/2ML IJ SOLN
INTRAMUSCULAR | Status: AC
  Filled 2016-12-19: qty 2

## 2016-12-19 MED ORDER — LACTATED RINGERS IV SOLN
INTRAVENOUS | Status: DC
  Administered 2016-12-19: 10:00:00 via INTRAVENOUS

## 2016-12-19 MED ORDER — LABETALOL HCL 5 MG/ML IV SOLN
INTRAVENOUS | Status: AC
  Filled 2016-12-19: qty 4

## 2016-12-19 MED ORDER — SUGAMMADEX SODIUM 200 MG/2ML IV SOLN
INTRAVENOUS | Status: AC
  Filled 2016-12-19: qty 2

## 2016-12-19 MED ORDER — MENTHOL 3 MG MT LOZG: 1.0000 | LOZENGE | OROMUCOSAL | Status: DC | PRN

## 2016-12-19 MED ORDER — SENNA 8.6 MG PO TABS
1.0000 | ORAL_TABLET | Freq: Two times a day (BID) | ORAL | Status: DC
  Filled 2016-12-19 (×3): qty 1

## 2016-12-19 MED ORDER — PANTOPRAZOLE SODIUM 40 MG IV SOLR
40.0000 mg | Freq: Every day | INTRAVENOUS | Status: DC
  Administered 2016-12-19 – 2016-12-20 (×2): 40 mg via INTRAVENOUS
  Filled 2016-12-19 (×2): qty 40

## 2016-12-19 MED ORDER — BUPIVACAINE LIPOSOME 1.3 % IJ SUSP
20.0000 mL | Freq: Once | INTRAMUSCULAR | Status: AC
  Filled 2016-12-19: qty 20

## 2016-12-19 MED ORDER — ZOLPIDEM TARTRATE 5 MG PO TABS: 5.0000 mg | ORAL_TABLET | Freq: Every evening | ORAL | Status: DC | PRN

## 2016-12-19 MED ORDER — METOCLOPRAMIDE HCL 5 MG/ML IJ SOLN
INTRAMUSCULAR | Status: DC | PRN
  Administered 2016-12-19: 10 mg via INTRAVENOUS

## 2016-12-19 MED ORDER — SODIUM CHLORIDE 0.9 % IV SOLN
INTRAVENOUS | Status: DC
  Administered 2016-12-19 – 2016-12-21 (×3): via INTRAVENOUS

## 2016-12-19 MED ORDER — FLEET ENEMA 7-19 GM/118ML RE ENEM: 1.0000 | ENEMA | Freq: Once | RECTAL | Status: DC | PRN

## 2016-12-19 MED ORDER — ONDANSETRON HCL 4 MG/2ML IJ SOLN
INTRAMUSCULAR | Status: DC | PRN
  Administered 2016-12-19 (×2): 4 mg via INTRAVENOUS

## 2016-12-19 MED ORDER — SCOPOLAMINE 1 MG/3DAYS TD PT72
MEDICATED_PATCH | TRANSDERMAL | Status: AC
  Filled 2016-12-19: qty 1

## 2016-12-19 MED ORDER — SODIUM CHLORIDE 0.9% FLUSH: 3.0000 mL | Freq: Two times a day (BID) | INTRAVENOUS | Status: DC

## 2016-12-19 MED ORDER — BUPIVACAINE HCL (PF) 0.25 % IJ SOLN
INTRAMUSCULAR | Status: AC
  Filled 2016-12-19: qty 30

## 2016-12-19 MED ORDER — VECURONIUM BROMIDE 10 MG IV SOLR
INTRAVENOUS | Status: DC | PRN
  Administered 2016-12-19: 3 mg via INTRAVENOUS
  Administered 2016-12-19: 5 mg via INTRAVENOUS
  Administered 2016-12-19: 2 mg via INTRAVENOUS

## 2016-12-19 MED ORDER — NOREPINEPHRINE BITARTRATE 1 MG/ML IV SOLN
0.0000 ug/min | INTRAVENOUS | Status: DC
  Filled 2016-12-19: qty 4

## 2016-12-19 MED ORDER — MIDAZOLAM HCL 5 MG/5ML IJ SOLN
INTRAMUSCULAR | Status: DC | PRN
  Administered 2016-12-19: 2 mg via INTRAVENOUS

## 2016-12-19 MED ORDER — KETOROLAC TROMETHAMINE 30 MG/ML IJ SOLN
INTRAMUSCULAR | Status: AC
  Filled 2016-12-19: qty 1

## 2016-12-19 MED ORDER — LACTATED RINGERS IV SOLN
INTRAVENOUS | Status: DC | PRN
  Administered 2016-12-19 (×3): via INTRAVENOUS

## 2016-12-19 MED ORDER — PROMETHAZINE HCL 25 MG/ML IJ SOLN: 6.2500 mg | INTRAMUSCULAR | Status: DC | PRN

## 2016-12-19 MED ORDER — SODIUM CHLORIDE 0.9% FLUSH: 3.0000 mL | INTRAVENOUS | Status: DC | PRN

## 2016-12-19 MED ORDER — LIDOCAINE-EPINEPHRINE 2 %-1:100000 IJ SOLN
INTRAMUSCULAR | Status: DC | PRN
  Administered 2016-12-19: 15 mL

## 2016-12-19 MED ORDER — 0.9 % SODIUM CHLORIDE (POUR BTL) OPTIME
TOPICAL | Status: DC | PRN
  Administered 2016-12-19: 1000 mL

## 2016-12-19 MED ORDER — FENTANYL CITRATE (PF) 250 MCG/5ML IJ SOLN
INTRAMUSCULAR | Status: AC
  Filled 2016-12-19: qty 5

## 2016-12-19 MED ORDER — ALBUMIN HUMAN 5 % IV SOLN
INTRAVENOUS | Status: DC | PRN
  Administered 2016-12-19 (×2): via INTRAVENOUS

## 2016-12-19 MED ORDER — VANCOMYCIN HCL 1000 MG IV SOLR
INTRAVENOUS | Status: DC | PRN
  Administered 2016-12-19: 1000 mg

## 2016-12-19 MED ORDER — VANCOMYCIN HCL 1000 MG IV SOLR
INTRAVENOUS | Status: AC
  Filled 2016-12-19: qty 1000

## 2016-12-19 MED ORDER — GABAPENTIN 300 MG PO CAPS
300.0000 mg | ORAL_CAPSULE | Freq: Three times a day (TID) | ORAL | Status: DC
  Administered 2016-12-19 – 2016-12-21 (×7): 300 mg via ORAL
  Filled 2016-12-19 (×6): qty 1

## 2016-12-19 MED ORDER — HYDROMORPHONE HCL 1 MG/ML IJ SOLN
INTRAMUSCULAR | Status: AC
  Filled 2016-12-19: qty 0.5

## 2016-12-19 MED ORDER — FENTANYL CITRATE (PF) 250 MCG/5ML IJ SOLN
INTRAMUSCULAR | Status: AC
  Filled 2016-12-19: qty 10

## 2016-12-19 MED ORDER — LABETALOL HCL 5 MG/ML IV SOLN
INTRAVENOUS | Status: DC | PRN
  Administered 2016-12-19: 10 mg via INTRAVENOUS
  Administered 2016-12-19 (×2): 5 mg via INTRAVENOUS
  Administered 2016-12-19: 10 mg via INTRAVENOUS

## 2016-12-19 MED ORDER — HYDROMORPHONE HCL 1 MG/ML IJ SOLN
0.2500 mg | INTRAMUSCULAR | Status: DC | PRN
  Administered 2016-12-19 (×2): 0.5 mg via INTRAVENOUS

## 2016-12-19 MED ORDER — ARTIFICIAL TEARS OP OINT
TOPICAL_OINTMENT | OPHTHALMIC | Status: AC
  Filled 2016-12-19: qty 3.5

## 2016-12-19 MED ORDER — ONDANSETRON HCL 4 MG/2ML IJ SOLN: 4.0000 mg | Freq: Four times a day (QID) | INTRAMUSCULAR | Status: DC | PRN

## 2016-12-19 MED ORDER — DEXAMETHASONE SODIUM PHOSPHATE 10 MG/ML IJ SOLN
INTRAMUSCULAR | Status: AC
  Filled 2016-12-19: qty 1

## 2016-12-19 MED ORDER — OXYBUTYNIN 3.9 MG/24HR TD PTTW
1.0000 | MEDICATED_PATCH | TRANSDERMAL | Status: DC
  Administered 2016-12-19: 1 via TRANSDERMAL
  Filled 2016-12-19: qty 1

## 2016-12-19 MED ORDER — PROPOFOL 10 MG/ML IV BOLUS
INTRAVENOUS | Status: DC | PRN
  Administered 2016-12-19 (×2): 50 mg via INTRAVENOUS
  Administered 2016-12-19: 150 mg via INTRAVENOUS

## 2016-12-19 MED ORDER — BUPIVACAINE HCL (PF) 0.5 % IJ SOLN
INTRAMUSCULAR | Status: DC | PRN
  Administered 2016-12-19: 15 mL

## 2016-12-19 MED ORDER — SUGAMMADEX SODIUM 200 MG/2ML IV SOLN
INTRAVENOUS | Status: DC | PRN
  Administered 2016-12-19: 200 mg via INTRAVENOUS

## 2016-12-19 MED ORDER — CELECOXIB 200 MG PO CAPS
200.0000 mg | ORAL_CAPSULE | Freq: Two times a day (BID) | ORAL | Status: DC
  Administered 2016-12-19 – 2016-12-21 (×4): 200 mg via ORAL
  Filled 2016-12-19 (×4): qty 1

## 2016-12-19 MED ORDER — DOCUSATE SODIUM 100 MG PO CAPS
100.0000 mg | ORAL_CAPSULE | Freq: Two times a day (BID) | ORAL | Status: DC
  Administered 2016-12-19 – 2016-12-21 (×4): 100 mg via ORAL
  Filled 2016-12-19 (×4): qty 1

## 2016-12-19 MED ORDER — SODIUM CHLORIDE 0.9 % IJ SOLN
INTRAMUSCULAR | Status: AC
  Filled 2016-12-19: qty 20

## 2016-12-19 MED ORDER — METOCLOPRAMIDE HCL 5 MG/ML IJ SOLN
INTRAMUSCULAR | Status: AC
  Filled 2016-12-19: qty 2

## 2016-12-19 MED ORDER — SCOPOLAMINE 1 MG/3DAYS TD PT72
1.0000 | MEDICATED_PATCH | TRANSDERMAL | Status: DC
  Administered 2016-12-19: 1.5 mg via TRANSDERMAL

## 2016-12-19 MED ORDER — ACETAMINOPHEN 10 MG/ML IV SOLN
INTRAVENOUS | Status: DC | PRN
  Administered 2016-12-19: 1000 mg via INTRAVENOUS

## 2016-12-19 MED ORDER — INSULIN ASPART 100 UNIT/ML ~~LOC~~ SOLN
0.0000 [IU] | Freq: Three times a day (TID) | SUBCUTANEOUS | Status: DC
  Administered 2016-12-19: 2 [IU] via SUBCUTANEOUS
  Administered 2016-12-20 (×2): 3 [IU] via SUBCUTANEOUS
  Administered 2016-12-20 – 2016-12-21 (×3): 5 [IU] via SUBCUTANEOUS
  Administered 2016-12-21: 0.3 [IU] via SUBCUTANEOUS
  Administered 2016-12-21: 3 [IU] via SUBCUTANEOUS

## 2016-12-19 MED ORDER — METHOCARBAMOL 750 MG PO TABS
750.0000 mg | ORAL_TABLET | Freq: Four times a day (QID) | ORAL | Status: DC
  Administered 2016-12-19 – 2016-12-21 (×9): 750 mg via ORAL
  Filled 2016-12-19 (×2): qty 1
  Filled 2016-12-19 (×7): qty 2

## 2016-12-19 MED ORDER — ARTIFICIAL TEARS OP OINT
TOPICAL_OINTMENT | OPHTHALMIC | Status: DC | PRN
  Administered 2016-12-19: 1 via OPHTHALMIC

## 2016-12-19 MED ORDER — PROPOFOL 10 MG/ML IV BOLUS
INTRAVENOUS | Status: AC
  Filled 2016-12-19: qty 20

## 2016-12-19 MED ORDER — KETAMINE HCL 10 MG/ML IJ SOLN
INTRAMUSCULAR | Status: DC | PRN
  Administered 2016-12-19 (×2): 50 mg via INTRAVENOUS

## 2016-12-19 MED ORDER — BUPIVACAINE-EPINEPHRINE (PF) 0.5% -1:200000 IJ SOLN
INTRAMUSCULAR | Status: DC | PRN
Start: 2016-12-19 — End: 2016-12-19

## 2016-12-19 MED ORDER — DIAZEPAM 5 MG PO TABS
5.0000 mg | ORAL_TABLET | Freq: Four times a day (QID) | ORAL | Status: DC | PRN
  Administered 2016-12-20 – 2016-12-21 (×2): 5 mg via ORAL
  Filled 2016-12-19 (×2): qty 1

## 2016-12-19 MED ORDER — LIDOCAINE HCL (CARDIAC) 20 MG/ML IV SOLN
INTRAVENOUS | Status: DC | PRN
  Administered 2016-12-19: 100 mg via INTRAVENOUS

## 2016-12-19 MED ORDER — HYDRALAZINE HCL 20 MG/ML IJ SOLN
10.0000 mg | Freq: Once | INTRAMUSCULAR | Status: AC
  Administered 2016-12-19: 10 mg via INTRAVENOUS

## 2016-12-19 MED ORDER — LIDOCAINE-EPINEPHRINE 2 %-1:100000 IJ SOLN
INTRAMUSCULAR | Status: AC
  Filled 2016-12-19: qty 1

## 2016-12-19 MED ORDER — LIDOCAINE 2% (20 MG/ML) 5 ML SYRINGE
INTRAMUSCULAR | Status: AC
  Filled 2016-12-19: qty 5

## 2016-12-19 MED ORDER — BACITRACIN 50000 UNITS IM SOLR
INTRAMUSCULAR | Status: DC | PRN
  Administered 2016-12-19: 500 mL

## 2016-12-19 MED ORDER — THROMBIN 5000 UNITS EX SOLR
CUTANEOUS | Status: AC
  Filled 2016-12-19: qty 5000

## 2016-12-19 MED ORDER — SURGIFOAM 100 EX MISC
CUTANEOUS | Status: DC | PRN
  Administered 2016-12-19: 20 mL via TOPICAL

## 2016-12-19 MED ORDER — VANCOMYCIN HCL IN DEXTROSE 1-5 GM/200ML-% IV SOLN
1000.0000 mg | Freq: Two times a day (BID) | INTRAVENOUS | Status: DC
  Administered 2016-12-20 – 2016-12-21 (×4): 1000 mg via INTRAVENOUS
  Filled 2016-12-19 (×5): qty 200

## 2016-12-19 MED ORDER — KETAMINE HCL 100 MG/ML IJ SOLN
INTRAMUSCULAR | Status: AC
  Filled 2016-12-19: qty 1

## 2016-12-19 MED ORDER — ACETAMINOPHEN 500 MG PO TABS
1000.0000 mg | ORAL_TABLET | Freq: Four times a day (QID) | ORAL | Status: DC
  Administered 2016-12-19 (×2): 1000 mg via ORAL
  Administered 2016-12-20: 500 mg via ORAL
  Administered 2016-12-20 – 2016-12-21 (×6): 1000 mg via ORAL
  Filled 2016-12-19 (×9): qty 2

## 2016-12-19 MED ORDER — ONDANSETRON HCL 4 MG PO TABS
4.0000 mg | ORAL_TABLET | Freq: Four times a day (QID) | ORAL | Status: DC | PRN
  Administered 2016-12-21: 4 mg via ORAL
  Filled 2016-12-19: qty 1

## 2016-12-19 MED ORDER — VANCOMYCIN HCL IN DEXTROSE 1-5 GM/200ML-% IV SOLN
INTRAVENOUS | Status: AC
  Filled 2016-12-19: qty 200

## 2016-12-19 MED ORDER — MEPERIDINE HCL 25 MG/ML IJ SOLN: 6.2500 mg | INTRAMUSCULAR | Status: DC | PRN

## 2016-12-19 MED ORDER — BUPIVACAINE LIPOSOME 1.3 % IJ SUSP
INTRAMUSCULAR | Status: DC | PRN
  Administered 2016-12-19: 20 mL

## 2016-12-19 MED ORDER — ACETAMINOPHEN 10 MG/ML IV SOLN
INTRAVENOUS | Status: AC
  Filled 2016-12-19: qty 100

## 2016-12-19 MED ORDER — HYDRALAZINE HCL 20 MG/ML IJ SOLN
INTRAMUSCULAR | Status: AC
  Filled 2016-12-19: qty 1

## 2016-12-19 MED ORDER — INSULIN ASPART 100 UNIT/ML ~~LOC~~ SOLN: 0.0000 [IU] | Freq: Three times a day (TID) | SUBCUTANEOUS | Status: DC

## 2016-12-19 MED ORDER — ROCURONIUM BROMIDE 50 MG/5ML IV SOSY
PREFILLED_SYRINGE | INTRAVENOUS | Status: AC
  Filled 2016-12-19: qty 5

## 2016-12-19 MED ORDER — FENTANYL CITRATE (PF) 100 MCG/2ML IJ SOLN
INTRAMUSCULAR | Status: DC | PRN
  Administered 2016-12-19: 50 ug via INTRAVENOUS
  Administered 2016-12-19 (×4): 100 ug via INTRAVENOUS
  Administered 2016-12-19 (×2): 50 ug via INTRAVENOUS
  Administered 2016-12-19: 150 ug via INTRAVENOUS
  Administered 2016-12-19: 50 ug via INTRAVENOUS

## 2016-12-19 MED ORDER — GELATIN ABSORBABLE MT POWD
OROMUCOSAL | Status: DC | PRN
  Administered 2016-12-19: 5 mL

## 2016-12-19 MED ORDER — BISACODYL 5 MG PO TBEC: 5.0000 mg | DELAYED_RELEASE_TABLET | Freq: Every day | ORAL | Status: DC | PRN

## 2016-12-19 MED ORDER — BUPIVACAINE HCL (PF) 0.5 % IJ SOLN
INTRAMUSCULAR | Status: AC
  Filled 2016-12-19: qty 30

## 2016-12-19 MED ORDER — VANCOMYCIN HCL IN DEXTROSE 1-5 GM/200ML-% IV SOLN: 1000.0000 mg | INTRAVENOUS | Status: DC

## 2016-12-19 MED ORDER — DEXAMETHASONE SODIUM PHOSPHATE 10 MG/ML IJ SOLN
INTRAMUSCULAR | Status: DC | PRN
  Administered 2016-12-19: 10 mg via INTRAVENOUS

## 2016-12-19 MED ORDER — METHYLPREDNISOLONE ACETATE 80 MG/ML IJ SUSP
INTRAMUSCULAR | Status: AC
  Filled 2016-12-19: qty 1

## 2016-12-19 MED ORDER — OXYCODONE HCL 5 MG PO TABS
5.0000 mg | ORAL_TABLET | ORAL | Status: DC | PRN
  Administered 2016-12-19: 10 mg via ORAL
  Filled 2016-12-19: qty 2

## 2016-12-19 MED ORDER — PHENOL 1.4 % MT LIQD
1.0000 | OROMUCOSAL | Status: DC | PRN
Start: 2016-12-19 — End: 2016-12-21

## 2016-12-19 MED ORDER — THROMBIN 20000 UNITS EX SOLR
CUTANEOUS | Status: AC
  Filled 2016-12-19: qty 20000

## 2016-12-19 MED ORDER — SODIUM CHLORIDE 0.9 % IV SOLN: 250.0000 mL | INTRAVENOUS | Status: DC

## 2016-12-19 MED ORDER — PHENYLEPHRINE 40 MCG/ML (10ML) SYRINGE FOR IV PUSH (FOR BLOOD PRESSURE SUPPORT)
PREFILLED_SYRINGE | INTRAVENOUS | Status: AC
  Filled 2016-12-19: qty 10

## 2016-12-19 MED ORDER — PHENYLEPHRINE HCL 10 MG/ML IJ SOLN
INTRAMUSCULAR | Status: DC | PRN
  Administered 2016-12-19: 25 ug/min via INTRAVENOUS

## 2016-12-19 MED ORDER — OXYCODONE HCL ER 20 MG PO T12A
20.0000 mg | EXTENDED_RELEASE_TABLET | Freq: Two times a day (BID) | ORAL | Status: DC
Start: 2016-12-19 — End: 2016-12-21
  Administered 2016-12-19 – 2016-12-21 (×4): 20 mg via ORAL
  Filled 2016-12-19 (×4): qty 1

## 2016-12-19 MED ORDER — ONDANSETRON HCL 4 MG/2ML IJ SOLN
INTRAMUSCULAR | Status: AC
  Filled 2016-12-19: qty 4

## 2016-12-19 MED ORDER — ROCURONIUM BROMIDE 100 MG/10ML IV SOLN
INTRAVENOUS | Status: DC | PRN
  Administered 2016-12-19: 50 mg via INTRAVENOUS

## 2016-12-19 SURGICAL SUPPLY — 89 items
ADH SKN CLS APL DERMABOND .7 (GAUZE/BANDAGES/DRESSINGS) ×2
APL SKNCLS STERI-STRIP NONHPOA (GAUZE/BANDAGES/DRESSINGS)
BAG DECANTER FOR FLEXI CONT (MISCELLANEOUS) ×3 IMPLANT
BENZOIN TINCTURE PRP APPL 2/3 (GAUZE/BANDAGES/DRESSINGS) ×2 IMPLANT
BLADE CLIPPER SURG (BLADE) IMPLANT
BLADE SURG 11 STRL SS (BLADE) ×3 IMPLANT
BUR MATCHSTICK NEURO 3.0 LAGG (BURR) ×3 IMPLANT
BUR ROUND FLUTED 5 RND (BURR) ×3 IMPLANT
CANISTER SUCT 3000ML PPV (MISCELLANEOUS) ×6 IMPLANT
CARTRIDGE OIL MAESTRO DRILL (MISCELLANEOUS) ×2 IMPLANT
CHLORAPREP W/TINT 26ML (MISCELLANEOUS) ×3 IMPLANT
DECANTER SPIKE VIAL GLASS SM (MISCELLANEOUS) ×4 IMPLANT
DERMABOND ADVANCED (GAUZE/BANDAGES/DRESSINGS) ×1
DERMABOND ADVANCED .7 DNX12 (GAUZE/BANDAGES/DRESSINGS) ×2 IMPLANT
DIFFUSER DRILL AIR PNEUMATIC (MISCELLANEOUS) ×3 IMPLANT
DRAPE C-ARM 42X72 X-RAY (DRAPES) ×6 IMPLANT
DRAPE C-ARMOR (DRAPES) ×1 IMPLANT
DRAPE MICROSCOPE LEICA (MISCELLANEOUS) ×1 IMPLANT
DRAPE POUCH INSTRU U-SHP 10X18 (DRAPES) ×3 IMPLANT
DRAPE SURG 17X23 STRL (DRAPES) ×4 IMPLANT
DRSG OPSITE POSTOP 4X6 (GAUZE/BANDAGES/DRESSINGS) ×1 IMPLANT
DRSG OPSITE POSTOP 4X8 (GAUZE/BANDAGES/DRESSINGS) ×1 IMPLANT
ELECT REM PT RETURN 9FT ADLT (ELECTROSURGICAL) ×3
ELECTRODE REM PT RTRN 9FT ADLT (ELECTROSURGICAL) ×2 IMPLANT
EVACUATOR 1/8 PVC DRAIN (DRAIN) ×1 IMPLANT
GAUZE SPONGE 4X4 12PLY STRL (GAUZE/BANDAGES/DRESSINGS) IMPLANT
GAUZE SPONGE 4X4 16PLY XRAY LF (GAUZE/BANDAGES/DRESSINGS) IMPLANT
GLOVE BIO SURGEON STRL SZ7 (GLOVE) ×2 IMPLANT
GLOVE BIOGEL PI IND STRL 7.0 (GLOVE) IMPLANT
GLOVE BIOGEL PI IND STRL 7.5 (GLOVE) ×4 IMPLANT
GLOVE BIOGEL PI INDICATOR 7.0 (GLOVE) ×2
GLOVE BIOGEL PI INDICATOR 7.5 (GLOVE) ×3
GLOVE INDICATOR 7.0 STRL GRN (GLOVE) ×1 IMPLANT
GLOVE SS BIOGEL STRL SZ 7.5 (GLOVE) ×6 IMPLANT
GLOVE SUPERSENSE BIOGEL SZ 7.5 (GLOVE) ×4
GOWN STRL REUS W/ TWL LRG LVL3 (GOWN DISPOSABLE) ×4 IMPLANT
GOWN STRL REUS W/ TWL XL LVL3 (GOWN DISPOSABLE) IMPLANT
GOWN STRL REUS W/TWL 2XL LVL3 (GOWN DISPOSABLE) ×2 IMPLANT
GOWN STRL REUS W/TWL LRG LVL3 (GOWN DISPOSABLE) ×6
GOWN STRL REUS W/TWL XL LVL3 (GOWN DISPOSABLE)
GRAFT BN 10X1XDBM MAGNIFUSE (Bone Implant) IMPLANT
GRAFT BONE MAGNIFUSE 1X10CM (Bone Implant) ×3 IMPLANT
HEMOSTAT POWDER KIT SURGIFOAM (HEMOSTASIS) ×3 IMPLANT
IV SOD CHL 0.9% 1000ML (IV SOLUTION) ×1 IMPLANT
KIT BASIN OR (CUSTOM PROCEDURE TRAY) ×3 IMPLANT
KIT INFUSE MEDIUM (Orthopedic Implant) ×1 IMPLANT
KIT ROOM TURNOVER OR (KITS) ×3 IMPLANT
KIT SPINE MAZOR X ROBO DISP (MISCELLANEOUS) ×1 IMPLANT
NDL HYPO 18GX1.5 BLUNT FILL (NEEDLE) ×2 IMPLANT
NDL HYPO 21X1.5 SAFETY (NEEDLE) ×4 IMPLANT
NDL HYPO 25X1 1.5 SAFETY (NEEDLE) ×2 IMPLANT
NEEDLE HYPO 18GX1.5 BLUNT FILL (NEEDLE) ×3 IMPLANT
NEEDLE HYPO 21X1.5 SAFETY (NEEDLE) ×6 IMPLANT
NEEDLE HYPO 25X1 1.5 SAFETY (NEEDLE) ×3 IMPLANT
NS IRRIG 1000ML POUR BTL (IV SOLUTION) ×3 IMPLANT
OIL CARTRIDGE MAESTRO DRILL (MISCELLANEOUS) ×3
PACK LAMINECTOMY NEURO (CUSTOM PROCEDURE TRAY) ×3 IMPLANT
PACK UNIVERSAL I (CUSTOM PROCEDURE TRAY) ×3 IMPLANT
PAD ARMBOARD 7.5X6 YLW CONV (MISCELLANEOUS) ×9 IMPLANT
PATTIES SURGICAL .5X1.5 (GAUZE/BANDAGES/DRESSINGS) ×3 IMPLANT
ROD STRT TI 5.5X300 ARSENAL (Rod) ×1 IMPLANT
RUBBERBAND STERILE (MISCELLANEOUS) ×2 IMPLANT
SCREW ARSENAL 5.5X40MM (Screw) ×8 IMPLANT
SCREW SET SPINAL ARSENAL 47127 (Screw) ×8 IMPLANT
SEALER BIPOLAR AQUA 2.3 (INSTRUMENTS) ×1 IMPLANT
SPONGE NEURO XRAY DETECT 1X3 (DISPOSABLE) ×3 IMPLANT
SPONGE SURGIFOAM ABS GEL 100 (HEMOSTASIS) ×3 IMPLANT
STAPLER VISISTAT 35W (STAPLE) ×1 IMPLANT
STRIP SURGICAL 1 X 6 IN (GAUZE/BANDAGES/DRESSINGS) IMPLANT
STRIP SURGICAL 1/2 X 6 IN (GAUZE/BANDAGES/DRESSINGS) IMPLANT
STRIP SURGICAL 1/4 X 6 IN (GAUZE/BANDAGES/DRESSINGS) IMPLANT
STRIP SURGICAL 3/4 X 6 IN (GAUZE/BANDAGES/DRESSINGS) IMPLANT
SUT PDS AB 1 CTX 36 (SUTURE) ×1 IMPLANT
SUT SILK 3 0 TIES 17X18 (SUTURE) ×3
SUT SILK 3-0 18XBRD TIE BLK (SUTURE) IMPLANT
SUT STRATAFIX 1PDS 45CM VIOLET (SUTURE) ×1 IMPLANT
SUT STRATAFIX SPIRAL + 2-0 (SUTURE) ×2 IMPLANT
SUT VIC AB 0 CT1 18XCR BRD8 (SUTURE) ×4 IMPLANT
SUT VIC AB 0 CT1 8-18 (SUTURE) ×6
SUT VIC AB 2-0 CT1 18 (SUTURE) ×6 IMPLANT
SUT VIC AB 3-0 SH 8-18 (SUTURE) ×6 IMPLANT
SUT VIC AB 4-0 PS2 27 (SUTURE) ×3 IMPLANT
SYR 30ML LL (SYRINGE) ×6 IMPLANT
SYR 5ML LL (SYRINGE) ×3 IMPLANT
TIP BLUNT NITINOL ILLICO 18 (INSTRUMENTS) ×9 IMPLANT
TOWEL GREEN STERILE (TOWEL DISPOSABLE) ×3 IMPLANT
TOWEL GREEN STERILE FF (TOWEL DISPOSABLE) ×3 IMPLANT
TUBE CONNECTING 12X1/4 (SUCTIONS) ×3 IMPLANT
WATER STERILE IRR 1000ML POUR (IV SOLUTION) ×3 IMPLANT

## 2016-12-19 NOTE — Transfer of Care (Signed)
Immediate Anesthesia Transfer of Care Note  Patient: Megan [name]  Procedure(s) Performed: Procedure(s): LEFT THORACIC TEN  COSTO TRANSVERSECTOMY , LEFT THORACIC TEN-ELEVEN DISCECTOMY , THORACIC NINE -TWELVE  POSTERIOR FIXATION AND FUSION (Left)  Patient Location: PACU  Anesthesia Type:General  Level of Consciousness: awake, sedated, drowsy, patient cooperative and responds to stimulation  Airway & Oxygen Therapy: Patient Spontanous Breathing and Patient connected to face mask oxygen  Post-op Assessment: Report given to RN and Post -op Vital signs reviewed and stable  Post vital signs: Reviewed and stable  Last Vitals:  Vitals:   12/19/16 0515 12/19/16 1542  BP: (!) 146/71   Pulse: 91   Resp: 18   Temp: 36.6 C (P) 36.3 C    Last Pain:  Vitals:   12/19/16 0515  TempSrc: Oral  PainSc:       Patients Stated Pain Goal: 2 (16/10/96 0454)  Complications: No apparent anesthesia complications

## 2016-12-19 NOTE — Progress Notes (Signed)
Pharmacy Antibiotic Note  Megan [name] is a 66 y.o. female admitted on 12/18/2016 with surgical prophylaxis.  Pharmacy has been consulted for vancomycin dosing. Patient has drain in place.  Plan: Vancomycin 1g IV every 12 hours.  Goal trough 15-20 mcg/mL.  Monitor culture data, renal function and clinical course VT at SS prn  Height: '5\' 8"'$  (172.7 cm) Weight: 164 lb 12.8 oz (74.8 kg) IBW/kg (Calculated) : 63.9  Temp (24hrs), Avg:97.9 F (36.6 C), Min:97.3 F (36.3 C), Max:98.8 F (37.1 C)   Recent Labs Lab 12/18/16 1933  CREATININE 0.73    Estimated Creatinine Clearance: 72.6 mL/min (by C-G formula based on SCr of 0.73 mg/dL).    Allergies  Allergen Reactions  . Shellfish Allergy Anaphylaxis, Swelling and Other (See Comments)    Tongue swells  . Oxycodone Nausea Only  . Penicillins Rash    Has patient had a PCN reaction causing immediate rash, facial/tongue/throat swelling, SOB or lightheadedness with hypotension: Yes Has patient had a PCN reaction causing severe rash involving mucus membranes or skin necrosis: No Has patient had a PCN reaction that required hospitalization No Has patient had a PCN reaction occurring within the last 10 years: No If all of the above answers are "NO", then may proceed with Cephalosporin use.     Andrey Cota. Diona Foley, PharmD, BCPS Clinical Pharmacist [phone]  12/19/2016 5:28 PM

## 2016-12-19 NOTE — Op Note (Signed)
12/18/2016 - 12/19/2016  3:19 PM  PATIENT:  Megan [name]  66 y.o. 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 I recommended the above procedure. Patient understood the risks, benefits, and alternatives and potential outcomes and wished to proceed.  PROCEDURE DETAILS: After smooth induction of general endotracheal anesthesia the patient was turned prone on an open [place] table. He was prepped and draped in usual sterile fashion. The skin and the midline was injected with lidocaine and Marcaine with epinephrine. A timeout was performed.  A linear incision was made from approximately the T9 pedicle to T12 pedicle. Bipolar cautery was used over the soft tissues in the midline to the spinous processes. Subperiosteal dissection was performed over the spinous processes, lamina, and transverse processes of T9-T12. Lateral fluoroscopy was used to confirm the appropriate levels. Dissection was then continued laterally over the T10 rib head.  The Mazor spine robot was brought in to position, clamped to the patient using spinous process clamps and then registered. Using the Mazor robot the pedicles were then cannulated from T9-T12 on the right using a drill guide with a high-speed drill. K wires were placed on these tracts. This was then repeated on the left side. AP and lateral fluoroscopy was used to determine that the  K wires were in good position. These tracts were then tapped and the K wires were removed. I palpated the tracts with a ball-tipped probe and found them to be competent. I placed the bilateral T9 and T12 screws and the right T10 and T11 screws.  I then separated the left T10 rib head from the underlying pleura. I was careful to protect the neurovascular bundle. The rib head was then resected with a rib cutter and Leksell rongeur. I resected the transverse process of T10 on the left. I used the Leksell to resect the T9 spinous process and the T10 spinous process and lamina. I then used a high-speed drill to thin the lamina and completed the laminectomy with a small Kerrison rongeur. I resected the ligamentum flavum and identified epidural fat and then the dura. The dura was clearly pushed to the right side. I resected the left T10  inferior facet and drilled down the superior T11 facet.  I identified the left T10 nerve root. This was ligated with a suture and then cut sharply.  The microscope was then draped and brought into the field to provide lighting and magnification. Using microsurgical technique I expanded the bony decompression to the left side of the thecal sac down to the level of the posterior vertebral body. I used a down pushing curet to separate the thecal sac from the posterior vertebral body wall. The disc herniation was apparent. With care taken to not manipulate the thecal sac I incised the elevated annulus and used a nerve hook to remove much of the disc material. I then used a down pushing curet to separate this further from the thecal sac. I alternated back and forth removing as much disc as possible without manipulating  the thecal sac. This allowed the thecal sac to return to its normal position. I palpated the ventral thecal sac with instrument and found it to be flat. I obtained hemostasis in the epidural space.  I then placed the left T10 and T11 screws. I irrigated vigorously with  bacitracin saline. There was meticulous hemostasis. I decorticated the remaining exposed bony surfaces of T9-T12. I placed rods within the tulip heads of the screws on both sides. These were secured with set screws and finally tightened with a torque device. I then placed pieces of BMP over the exposed bone with care taken to keep it away from the epidural space. I placed 2 bags of magnified views 1 on each side over the exposed bony surfaces. I added the small amount of autograft that was obtained to this. Vancomycin powder was then placed in the depths of the wound. A medium Hemovac drain was placed and tunneled through the skin.  The wound was closed in routine anatomic layers with running strata fix suture. Exparel was injected into the paraspinous muscles.  The skin was closed with a running Monocryl strata fix suture and sealed with Dermabond. The drain was tied down with a Vicryl suture. A sterile dressing was applied. The patient was returned to the supine position and allowed to waken from anesthesia.  PATIENT DISPOSITION:  PACU - hemodynamically stable.   Delay start of Pharmacological VTE agent (>24hrs) due to surgical blood loss or risk of bleeding:  yes

## 2016-12-19 NOTE — Anesthesia Preprocedure Evaluation (Signed)
Anesthesia Evaluation  Patient identified by MRN, date of birth, ID band Patient awake    Reviewed: Allergy & Precautions, NPO status , Patient's Chart, lab work & pertinent test results  History of Anesthesia Complications (+) PONV and history of anesthetic complications  Airway Mallampati: II  TM Distance: >3 FB Neck ROM: Full    Dental  (+) Partial Upper, Partial Lower, Missing, Dental Advisory Given,    Pulmonary pneumonia, former smoker,    breath sounds clear to auscultation       Cardiovascular hypertension, (-) angina Rhythm:Regular Rate:Normal     Neuro/Psych Chronic back pain: tramadol    GI/Hepatic GERD  ,  Endo/Other  negative endocrine ROSdiabetes  Renal/GU negative Renal ROS     Musculoskeletal  (+) Arthritis ,   Abdominal   Peds  Hematology negative hematology ROS (+)   Anesthesia Other Findings   Reproductive/Obstetrics                             Anesthesia Physical  Anesthesia Plan  ASA: II  Anesthesia Plan: General   Post-op Pain Management:    Induction: Intravenous  Airway Management Planned: Oral ETT  Additional Equipment: Arterial line  Intra-op Plan:   Post-operative Plan: Extubation in OR  Informed Consent: I have reviewed the patients History and Physical, chart, labs and discussed the procedure including the risks, benefits and alternatives for the proposed anesthesia with the patient or authorized representative who has indicated his/her understanding and acceptance.   Dental advisory given  Plan Discussed with: Anesthesiologist, Surgeon and CRNA  Anesthesia Plan Comments: (Plan routine monitors, GETA)        Anesthesia Quick Evaluation

## 2016-12-19 NOTE — Anesthesia Procedure Notes (Signed)
Procedure Name: Intubation Date/Time: 12/19/2016 11:42 AM Performed by: Jacquiline Doe A Pre-anesthesia Checklist: Patient identified, Emergency Drugs available, Suction available and Patient being monitored Patient Re-evaluated:Patient Re-evaluated prior to inductionOxygen Delivery Method: Circle System Utilized and Circle system utilized Preoxygenation: Pre-oxygenation with 100% oxygen Intubation Type: IV induction and Cricoid Pressure applied Ventilation: Mask ventilation without difficulty and Oral airway inserted - appropriate to patient size Laryngoscope Size: Mac and 3 Grade View: Grade I Tube type: Oral Tube size: 7.5 mm Number of attempts: 1 Airway Equipment and Method: Stylet and Oral airway Placement Confirmation: ETT inserted through vocal cords under direct vision,  positive ETCO2 and breath sounds checked- equal and bilateral Secured at: 22 cm Tube secured with: Tape Dental Injury: Teeth and Oropharynx as per pre-operative assessment

## 2016-12-20 LAB — BASIC METABOLIC PANEL
ANION GAP: 9 (ref 5–15)
BUN: 14 mg/dL (ref 6–20)
CALCIUM: 8.6 mg/dL — AB (ref 8.9–10.3)
CO2: 22 mmol/L (ref 22–32)
CREATININE: 0.84 mg/dL (ref 0.44–1.00)
Chloride: 105 mmol/L (ref 101–111)
GFR calc Af Amer: >60 mL/min (ref >60)
GFR calc non Af Amer: >60 mL/min (ref >60)
GLUCOSE: 194 mg/dL — AB (ref 65–99)
Potassium: 4 mmol/L (ref 3.5–5.1)
SODIUM: 136 mmol/L (ref 135–145)

## 2016-12-20 LAB — CBC
HCT: 30.6 % — ABNORMAL LOW (ref 36.0–46.0)
Hemoglobin: 10.4 g/dL — ABNORMAL LOW (ref 12.0–15.0)
MCH: 30 pg (ref 26.0–34.0)
MCHC: 34 g/dL (ref 30.0–36.0)
MCV: 88.2 fL (ref 78.0–100.0)
Platelets: 185 10*3/uL (ref 150–400)
RBC: 3.47 MIL/uL — ABNORMAL LOW (ref 3.87–5.11)
RDW: 13.8 % (ref 11.5–15.5)
WBC: 11.8 10*3/uL — ABNORMAL HIGH (ref 4.0–10.5)

## 2016-12-20 LAB — GLUCOSE, CAPILLARY
Glucose-Capillary: 158 mg/dL — ABNORMAL HIGH (ref 65–99)
Glucose-Capillary: 238 mg/dL — ABNORMAL HIGH (ref 65–99)
Glucose-Capillary: 207 mg/dL — ABNORMAL HIGH (ref 65–99)
Glucose-Capillary: 199 mg/dL — ABNORMAL HIGH (ref 65–99)

## 2016-12-20 NOTE — Care Management Note (Signed)
Case Management Note  Patient Details  Name: Megan [name] MRN: [id number] Date of Birth: [birth year]  Subjective/Objective:  Pt admitted s/p left T10 costotransversectomy, T10-11 laminectomy, left T10-11 discectomy, and T9-T12 dorsal internal fixation and fusion with bilateral pedicle screws on 4/8.  PTA, pt independent, lives with spouse.                   Action/Plan: PT/OT recommending CIR and consult ordered.  Will follow progress.   Expected Discharge Date:  12/23/16               Expected Discharge Plan:  [place]  In-House Referral:     Discharge planning Services  CM Consult  Post Acute Care Choice:    Choice offered to:     DME Arranged:    DME Agency:     HH Arranged:    HH Agency:     Status of Service:  In process, will continue to follow  If discussed at Long Length of Stay Meetings, dates discussed:    Additional Comments:  Ella Bodo, RN 12/20/2016, 5:15 PM

## 2016-12-20 NOTE — Progress Notes (Signed)
Inpatient Diabetes Program Recommendations  AACE/ADA: New Consensus Statement on Inpatient Glycemic Control (2015)  Target Ranges:  Prepandial:   less than 140 mg/dL      Peak postprandial:   less than 180 mg/dL (1-2 hours)      Critically ill patients:  140 - 180 mg/dL   Results for Megan [name], Megan [name] (MRN [id number]) as of 12/20/2016 14:53  Ref. Range 12/19/2016 09:28 12/19/2016 15:59 12/19/2016 23:24 12/20/2016 07:45 12/20/2016 11:22  Glucose-Capillary Latest Ref Range: 65 - 99 mg/dL 186 (H) 218 (H) 221 (H) 158 (H) 238 (H)   Review of Glycemic Control  Diabetes history: DM 2 Outpatient Diabetes medications: Metformin 500 mg BID Current orders for Inpatient glycemic control: Metformin 5000 mg BID, Novolog Moderate TID  Patient also receiving Decadron 6 mg Q6 hours  Inpatient Diabetes Program Recommendations:   Glucose elevated. Consider Novolog 3 units tid meal coverage in addition to adding Novolog HS correction scale.  Thanks,  Tama Headings RN, MSN, [facility] Inpatient Diabetes Coordinator Team Pager [phone] (8a-5p)

## 2016-12-20 NOTE — Progress Notes (Signed)
Rehab Admissions Coordinator Note:  Patient was screened by Retta Diones for appropriateness for an Inpatient Acute Rehab Consult.  At this time, we are recommending Inpatient Rehab consult.  Retta Diones 12/20/2016, 5:01 PM  I can be reached at [phone].

## 2016-12-20 NOTE — Procedures (Signed)
Occupational Therapy Evaluation Patient Details Name: Megan [name] MRN: [id number] DOB: 04-14-1951 Today's Date: 12/20/2016    History of Present Illness Pt is a 66 y.o. female admitted to ED on 12/18/16 after an MRI at [facility] showed severe cord compression. Now s/p left T10 costotransversectomy, T10-11 laminectomy, left T10-11 discectomy, and T9-T12 dorsal internal fixation and fusion with bilateral pedicle screws on 4/8. Pertinent PMH includes DM, GERD, HLD, arthritis; pt also has sx history pertinent for L4-5 (2016) and L2-3 fusion (2017).    Clinical Impression   Pt admitted with above. She demonstrates the below listed deficits and will benefit from continued OT to maximize safety and independence with BADLs.  Pt presents with paraparesis, impaired balance, and pain.  She currently requires mod - total A for ADLs - she was mod I prior to recent decline.  Spouse is very supportive.  Feel she would be an excellent candidate for CIR to allow her to maximize her independence with ADLs and functional transfers, as well as reduce risk of injury and readmission.  Will follow.       Follow Up Recommendations  CIR;Supervision/Assistance - 24 hour    Equipment Recommendations       Recommendations for Other Services Rehab consult     Precautions / Restrictions Precautions Precautions: Back Precaution Booklet Issued: No Precaution Comments: Pt able to recall 2/3 back precautions  Required Braces or Orthoses: Spinal Brace Spinal Brace: Thoracolumbosacral orthotic;Applied in sitting position      Mobility Bed Mobility Overal bed mobility: Needs Assistance Bed Mobility: Rolling;Sidelying to Sit;Sit to Sidelying Rolling: Min assist Sidelying to sit: Max assist     Sit to sidelying: Max assist General bed mobility comments: Pt requires cues for log rolling.  Assist to flex bil. knees and assist to lift trunk   Transfers Overall transfer level: Needs assistance Equipment used:  Rolling walker (2 wheeled) Transfers: Sit to/from Stand Sit to Stand: Mod assist;+2 physical assistance         General transfer comment: Pt stood x 3 with mod A +2, however, Rt knee flexed spontaneously resulting in no contact of Rt LE with the ground.  With input through Rt knee, she was unable to assist or extend Rt LE.  She was unable to achieve full extension of Lt LE with Lt LE sliding out from under her     Balance Overall balance assessment: Needs assistance Sitting-balance support: Feet supported;Bilateral upper extremity supported Sitting balance-Leahy Scale: Poor Sitting balance - Comments: Pt requires bil. Ue support.  Worked on reaching within BOS - requires mod A due to posterior LOB  Postural control: Posterior lean Standing balance support: Bilateral upper extremity supported;During functional activity Standing balance-Leahy Scale: Poor                             ADL either performed or assessed with clinical judgement   ADL Overall ADL's : Needs assistance/impaired Eating/Feeding: Independent   Grooming: Wash/dry hands;Wash/dry face;Oral care;Brushing hair;Set up;Bed level   Upper Body Bathing: Moderate assistance;Bed level;Standing   Lower Body Bathing: Maximal assistance;Bed level   Upper Body Dressing : Maximal assistance;Sitting   Lower Body Dressing: Total assistance;Bed level   Toilet Transfer: Maximal assistance;+2 for physical assistance;Stand-pivot;BSC   Toileting- Clothing Manipulation and Hygiene: Total assistance       Functional mobility during ADLs: Moderate assistance;Maximal assistance;+2 for physical assistance       Vision  Perception     Praxis      Pertinent Vitals/Pain Pain Assessment: 0-10 Pain Score: 3  Pain Location: Back and Rt rib area  Pain Descriptors / Indicators: Aching;Grimacing Pain Intervention(s): Monitored during session;Limited activity within patient's tolerance     Hand Dominance  Right   Extremity/Trunk Assessment Upper Extremity Assessment Upper Extremity Assessment: Overall WFL for tasks assessed   Lower Extremity Assessment Lower Extremity Assessment: Defer to PT evaluation   Cervical / Trunk Assessment Cervical / Trunk Assessment: Other exceptions Cervical / Trunk Exceptions: pt maintains flexed posture - appears to have decreased active lower trunk/lumbar extension, but difficult to assess due to pain and brace    Communication Communication Communication: No difficulties   Cognition Arousal/Alertness: Awake/alert Behavior During Therapy: WFL for tasks assessed/performed Overall Cognitive Status: Within Functional Limits for tasks assessed                                     General Comments  VSS     Exercises     Shoulder Instructions      Home Living Family/patient expects to be discharged to:: Private residence Living Arrangements: Spouse/significant other Available Help at Discharge: Family;Available 24 hours/day Type of Home: House Home Access: Stairs to enter [employer] of Steps: 1 Entrance Stairs-Rails: None Home Layout: One level     Bathroom Shower/Tub: Tub/shower unit;Door;Curtain   Biochemist, clinical: Standard Bathroom Accessibility: Yes   Home Equipment: Clinical cytogeneticist - 2 wheels;Cane - single point;Walker - 4 wheels;Grab bars - tub/shower   Additional Comments: Pt has been using scooter at home and it does fit through bathroom door       Prior Functioning/Environment Level of Independence: Needs assistance  Gait / Transfers Assistance Needed: Pt has had progressive weakness over past few months - for past 3 weeks, has required scooter for household and community amb secondary to BLE weakness; requires intermittent assist from husband to transfer to/from. Prior to this, she required RW for amb.  ADL's / Homemaking Assistance Needed: Husband performs cooking/cleaning; provides intermittent assist  for dressing, bathing. Pt indep with pericare.   Comments: Pt reports progressive decline in function due to weakenss over the past 3+ weeks.  Prior to that, pt was mod I and ambulatory         OT Problem List: Decreased strength;Decreased activity tolerance;Impaired balance (sitting and/or standing);Decreased safety awareness;Decreased knowledge of use of DME or AE;Decreased knowledge of precautions;Impaired sensation;Pain      OT Treatment/Interventions: Self-care/ADL training;Therapeutic exercise;DME and/or AE instruction;Therapeutic activities;Patient/family education;Balance training    OT Goals(Current goals can be found in the care plan section) Acute Rehab OT Goals Patient Stated Goal: Return home OT Goal Formulation: With patient/family Time For Goal Achievement: 01/03/17 Potential to Achieve Goals: Good ADL Goals Pt Will Perform Upper Body Bathing: with set-up;with supervision;sitting Pt Will Perform Lower Body Bathing: with mod assist;sit to/from stand;sitting/lateral leans Pt Will Perform Upper Body Dressing: with supervision;with set-up;sitting Pt Will Transfer to Toilet: with mod assist;stand pivot transfer;squat pivot transfer;bedside commode Pt Will Perform Toileting - Clothing Manipulation and hygiene: with mod assist;sitting/lateral leans;sit to/from stand  OT Frequency: Min 2X/week   Barriers to D/C:            Co-evaluation              End of Session Equipment Utilized During Treatment: Gait belt;Back brace;Rolling walker Nurse Communication: Mobility status  Activity  Tolerance: Patient tolerated treatment well Patient left: in bed;with call bell/phone within reach;with family/visitor present  OT Visit Diagnosis: Muscle weakness (generalized) (M62.81);Unsteadiness on feet (R26.81)                Time: 1610-9604 OT Time Calculation (min): 46 min Charges:  OT General Charges $OT Visit: 1 Procedure OT Evaluation $OT Eval Moderate Complexity: 1  Procedure OT Treatments $Therapeutic Activity: 23-37 mins G-Codes:     [facility], OTR/L [phone]   Lucille Passy M 12/20/2016, 10:12 PM

## 2016-12-20 NOTE — Evaluation (Signed)
Physical Therapy Evaluation Patient Details Name: Megan [name] MRN: [id number] DOB: February 16, 1951 Today's Date: 12/20/2016   History of Present Illness  Pt is a 66 y.o. female admitted to ED on 12/18/16 after an MRI at [facility] showed severe cord compression. Now s/p left T10 costotransversectomy, T10-11 laminectomy, left T10-11 discectomy, and T9-T12 dorsal internal fixation and fusion with bilateral pedicle screws on 4/8. Pertinent PMH includes DM, GERD, HLD, arthritis; pt also has sx history pertinent for L4-5 (2016) and L2-3 fusion (2017).   Clinical Impression  Pt presents to PT with generalized LE weakness (R>L), impaired sensation, decreased balance, and an overall decrease in functional mobility secondary to above. PTA, pt reports progressive worsening with mobility due to BLE weakness; for the past 3 weeks has been using scooter for household and community mobility, reliant on husband for intermittent assist with ADLs. Lives with husband who is available for 24-7 assist if needed. Today, pt able to perform bed mob with min-maxA and take steps to chair with RW and modA +2; reliant on physical assist of RLE to take steps. Pt educ on precautions, brace application, and importance of mobility. Will benefit from continued acute PT services with CIR level therapies at d/c to maximize functional mobility and independence.     Follow Up Recommendations CIR;Supervision for mobility/OOB    Equipment Recommendations  None recommended by PT    Recommendations for Other Services Rehab consult     Precautions / Restrictions Precautions Precautions: Back Precaution Booklet Issued: No (Precautions reviewed) Required Braces or Orthoses: Spinal Brace Spinal Brace: Thoracolumbosacral orthotic;Applied in sitting position Restrictions Weight Bearing Restrictions: No      Mobility  Bed Mobility Overal bed mobility: Needs Assistance Bed Mobility: Rolling;Sidelying to Sit Rolling: Min  assist Sidelying to sit: Max assist       General bed mobility comments: MinA to roll onto L side (pt prefers this secondary to R-rib pain) with use of hand rail. MaxA to maneuver BLE off bed and for trunk support into sitting.  Transfers Overall transfer level: Needs assistance Equipment used: Rolling walker (2 wheeled) Transfers: Sit to/from Stand Sit to Stand: Mod assist         General transfer comment: Pt able to stand with RW and modA for trunk support. Input through RLE for knee extension.   Ambulation/Gait Ambulation/Gait assistance: +2 physical assistance;Mod assist Ambulation Distance (Feet): 2 Feet Assistive device: Rolling walker (2 wheeled) Gait Pattern/deviations: Step-to pattern     General Gait Details: Amb a few steps to chair with RW and modA +2 for trunk support and physical assist to move R foot; pt with increased difficulty clearing RLE to bring it forward, better able to slide RLE backwards.   Stairs            Wheelchair Mobility    Modified Rankin (Stroke Patients Only)       Balance Overall balance assessment: Needs assistance Sitting-balance support: Bilateral upper extremity supported;Feet supported Sitting balance-Leahy Scale: Fair Sitting balance - Comments: Reliant on BUE support for seated balance   Standing balance support: Bilateral upper extremity supported;During functional activity Standing balance-Leahy Scale: Poor Standing balance comment: Reliant on BUE support for standing balance; RLE instability                             Pertinent Vitals/Pain Pain Assessment: 0-10 Pain Score: 7  Pain Location: Back with mobility Pain Descriptors / Indicators: Aching Pain Intervention(s): Limited  activity within patient's tolerance;Premedicated before session;Monitored during session;Repositioned    Home Living Family/patient expects to be discharged to:: Private residence Living Arrangements: Spouse/significant  other Available Help at Discharge: Family;Available 24 hours/day Type of Home: House Home Access: Stairs to enter Entrance Stairs-Rails: None Entrance Stairs-Number of Steps: 1 Home Layout: One level Home Equipment: Clinical cytogeneticist - 2 wheels;Cane - single point;Walker - 4 wheels;Grab bars - tub/shower      Prior Function Level of Independence: Needs assistance   Gait / Transfers Assistance Needed: Pt has had progressive weakness over past few months - for past 3 weeks, has required scooter for household and community amb secondary to BLE weakness; requires intermittent assist from husband to transfer to/from. Prior to this, she required RW for amb.   ADL's / Homemaking Assistance Needed: Husband performs cooking/cleaning; provides intermittent assist for dressing, bathing. Pt indep with pericare.        Hand Dominance   Dominant Hand: Right    Extremity/Trunk Assessment   Upper Extremity Assessment Upper Extremity Assessment: Defer to OT evaluation    Lower Extremity Assessment Lower Extremity Assessment: RLE deficits/detail;LLE deficits/detail RLE Deficits / Details: Hip flexion grossly 1/5, knee flex/ext grossly 2/5, ankle DF 1/5, ankle PF 3/5. Increased knee ext/flex tone RLE Sensation: decreased light touch;decreased proprioception (Propioception not formally tested) LLE Deficits / Details: LLE gross 3/5       Communication   Communication: No difficulties  Cognition Arousal/Alertness: Awake/alert Behavior During Therapy: WFL for tasks assessed/performed Overall Cognitive Status: Within Functional Limits for tasks assessed                                        General Comments General comments (skin integrity, edema, etc.): SpO2 remained >96% on RA    Exercises     Assessment/Plan    PT Assessment Patient needs continued PT services  PT Problem List Decreased strength;Decreased balance;Pain;Decreased knowledge of use of DME;Decreased  mobility;Decreased activity tolerance;Impaired sensation;Impaired tone       PT Treatment Interventions DME instruction;Functional mobility training;Balance training;Gait training;Therapeutic activities;Stair training;Therapeutic exercise    PT Goals (Current goals can be found in the Care Plan section)  Acute Rehab PT Goals Patient Stated Goal: Return home PT Goal Formulation: With patient Time For Goal Achievement: 01/03/17 Potential to Achieve Goals: Good    Frequency Min 5X/week   Barriers to discharge        Co-evaluation               End of Session Equipment Utilized During Treatment: Gait belt Activity Tolerance: Patient tolerated treatment well Patient left: in chair;with call bell/phone within reach;with family/visitor present Nurse Communication: Mobility status PT Visit Diagnosis: Muscle weakness (generalized) (M62.81);Other abnormalities of gait and mobility (R26.89)    Time: 7829-5621 PT Time Calculation (min) (ACUTE ONLY): 45 min   Charges:   PT Evaluation $PT Eval Low Complexity: 1 Procedure PT Treatments $Therapeutic Activity: 23-37 mins   PT G Codes:       Enis Gash, SPT Office-[phone]  Mabeline Caras 12/20/2016, 1:17 PM

## 2016-12-20 NOTE — Progress Notes (Signed)
Pt seen and examined. No issues overnight.  EXAM: Temp:  [97.3 F (36.3 C)-98.5 F (36.9 C)] 98.3 F (36.8 C) (04/09 0800) Pulse Rate:  [59-99] 62 (04/09 1100) Resp:  [9-26] 20 (04/09 1100) BP: (121-157)/(57-86) 128/62 (04/09 1100) SpO2:  [97 %-100 %] 97 % (04/09 1100) Arterial Line BP: (173-222)/(59-85) 188/74 (04/09 1000) Intake/Output      04/08 0701 - 04/09 0700 04/09 0701 - 04/10 0700   P.O. 360    I.V. (mL/kg) 4290 (57.4)    IV Piggyback 700    Total Intake(mL/kg) 5350 (71.5)    Urine (mL/kg/hr) 2385 (1.3)    Drains 350 (0.2)    Blood 300 (0.2)    Total Output 3035     Net +2315           Awake and alert Follows commands throughout Back to pre-op neurological function Left HF 2/5 otherwise 3-4/5 Right leg 1/5 distally otherwise 0/5  Stable Continue current care Likely out of the ICU tomorrow

## 2016-12-20 NOTE — Progress Notes (Signed)
Orthopedic Tech Progress Note Patient Details:  Megan [name] 1951-06-29 [id number]  Patient ID: Megan [name], female   DOB: 11-13-1950, 66 y.o.   MRN: [id number]   Hildred Priest 12/20/2016, 9:14 AM Called in bio-tech brace order; spoke with Bella Kennedy

## 2016-12-21 ENCOUNTER — Encounter (HOSPITAL_COMMUNITY): Payer: Self-pay | Admitting: Neurological Surgery

## 2016-12-21 ENCOUNTER — Inpatient Hospital Stay (HOSPITAL_COMMUNITY)
Admission: RE | Admit: 2016-12-21 | Discharge: 2017-01-04 | DRG: 560 | Disposition: A | Discharge status: Home Health | Payer: BLUE CROSS/BLUE SHIELD | Source: Intra-hospital | Attending: Physical Medicine & Rehabilitation | Admitting: Physical Medicine & Rehabilitation

## 2016-12-21 DIAGNOSIS — K5900 Constipation, unspecified: Secondary | ICD-10-CM | POA: Diagnosis present

## 2016-12-21 DIAGNOSIS — E1165 Type 2 diabetes mellitus with hyperglycemia: Secondary | ICD-10-CM

## 2016-12-21 DIAGNOSIS — E785 Hyperlipidemia, unspecified: Secondary | ICD-10-CM

## 2016-12-21 DIAGNOSIS — G8220 Paraplegia, unspecified: Secondary | ICD-10-CM | POA: Diagnosis present

## 2016-12-21 DIAGNOSIS — G959 Disease of spinal cord, unspecified: Secondary | ICD-10-CM | POA: Diagnosis present

## 2016-12-21 DIAGNOSIS — M4714 Other spondylosis with myelopathy, thoracic region: Secondary | ICD-10-CM

## 2016-12-21 DIAGNOSIS — Z4789 Encounter for other orthopedic aftercare: Principal | ICD-10-CM

## 2016-12-21 DIAGNOSIS — D62 Acute posthemorrhagic anemia: Secondary | ICD-10-CM | POA: Diagnosis present

## 2016-12-21 DIAGNOSIS — E119 Type 2 diabetes mellitus without complications: Secondary | ICD-10-CM

## 2016-12-21 DIAGNOSIS — K592 Neurogenic bowel, not elsewhere classified: Secondary | ICD-10-CM | POA: Diagnosis present

## 2016-12-21 DIAGNOSIS — N319 Neuromuscular dysfunction of bladder, unspecified: Secondary | ICD-10-CM | POA: Diagnosis present

## 2016-12-21 DIAGNOSIS — Z419 Encounter for procedure for purposes other than remedying health state, unspecified: Secondary | ICD-10-CM

## 2016-12-21 DIAGNOSIS — Z981 Arthrodesis status: Secondary | ICD-10-CM

## 2016-12-21 DIAGNOSIS — Z87891 Personal history of nicotine dependence: Secondary | ICD-10-CM

## 2016-12-21 DIAGNOSIS — N312 Flaccid neuropathic bladder, not elsewhere classified: Secondary | ICD-10-CM | POA: Diagnosis not present

## 2016-12-21 DIAGNOSIS — Z885 Allergy status to narcotic agent status: Secondary | ICD-10-CM | POA: Diagnosis not present

## 2016-12-21 DIAGNOSIS — I10 Essential (primary) hypertension: Secondary | ICD-10-CM | POA: Diagnosis present

## 2016-12-21 DIAGNOSIS — G2581 Restless legs syndrome: Secondary | ICD-10-CM | POA: Diagnosis present

## 2016-12-21 DIAGNOSIS — Z833 Family history of diabetes mellitus: Secondary | ICD-10-CM

## 2016-12-21 DIAGNOSIS — Z79899 Other long term (current) drug therapy: Secondary | ICD-10-CM | POA: Diagnosis not present

## 2016-12-21 DIAGNOSIS — T380X5D Adverse effect of glucocorticoids and synthetic analogues, subsequent encounter: Secondary | ICD-10-CM

## 2016-12-21 DIAGNOSIS — N390 Urinary tract infection, site not specified: Secondary | ICD-10-CM | POA: Diagnosis present

## 2016-12-21 DIAGNOSIS — Z90710 Acquired absence of both cervix and uterus: Secondary | ICD-10-CM | POA: Diagnosis not present

## 2016-12-21 DIAGNOSIS — Z7984 Long term (current) use of oral hypoglycemic drugs: Secondary | ICD-10-CM | POA: Diagnosis not present

## 2016-12-21 DIAGNOSIS — Z880 Allergy status to penicillin: Secondary | ICD-10-CM

## 2016-12-21 DIAGNOSIS — K219 Gastro-esophageal reflux disease without esophagitis: Secondary | ICD-10-CM | POA: Diagnosis present

## 2016-12-21 DIAGNOSIS — Z91013 Allergy to seafood: Secondary | ICD-10-CM | POA: Diagnosis not present

## 2016-12-21 DIAGNOSIS — N3281 Overactive bladder: Secondary | ICD-10-CM | POA: Diagnosis present

## 2016-12-21 DIAGNOSIS — M792 Neuralgia and neuritis, unspecified: Secondary | ICD-10-CM

## 2016-12-21 DIAGNOSIS — G8918 Other acute postprocedural pain: Secondary | ICD-10-CM

## 2016-12-21 DIAGNOSIS — M7989 Other specified soft tissue disorders: Secondary | ICD-10-CM | POA: Diagnosis not present

## 2016-12-21 HISTORY — DX: Disease of spinal cord, unspecified: G95.9

## 2016-12-21 LAB — GLUCOSE, CAPILLARY
GLUCOSE-CAPILLARY: 171 mg/dL — AB (ref 65–99)
GLUCOSE-CAPILLARY: 233 mg/dL — AB (ref 65–99)
GLUCOSE-CAPILLARY: 158 mg/dL — AB (ref 65–99)
Glucose-Capillary: 186 mg/dL — ABNORMAL HIGH (ref 65–99)

## 2016-12-21 MED ORDER — OXYCODONE HCL ER 10 MG PO T12A
20.0000 mg | EXTENDED_RELEASE_TABLET | Freq: Two times a day (BID) | ORAL | Status: AC
  Administered 2016-12-21 – 2017-01-03 (×26): 20 mg via ORAL
  Filled 2016-12-21 (×26): qty 2

## 2016-12-21 MED ORDER — ACETAMINOPHEN 325 MG PO TABS: 650.0000 mg | ORAL_TABLET | Freq: Four times a day (QID) | ORAL | Status: DC | PRN

## 2016-12-21 MED ORDER — ADULT MULTIVITAMIN W/MINERALS CH
1.0000 | ORAL_TABLET | Freq: Every day | ORAL | Status: DC
  Administered 2016-12-22 – 2016-12-31 (×7): 1 via ORAL
  Filled 2016-12-21 (×13): qty 1

## 2016-12-21 MED ORDER — DOCUSATE SODIUM 100 MG PO CAPS
100.0000 mg | ORAL_CAPSULE | Freq: Two times a day (BID) | ORAL | Status: DC
Start: 2016-12-21 — End: 2016-12-26
  Administered 2016-12-21 – 2016-12-26 (×10): 100 mg via ORAL
  Filled 2016-12-21 (×10): qty 1

## 2016-12-21 MED ORDER — SENNOSIDES-DOCUSATE SODIUM 8.6-50 MG PO TABS: 1.0000 | ORAL_TABLET | Freq: Every evening | ORAL | Status: DC | PRN

## 2016-12-21 MED ORDER — GABAPENTIN 300 MG PO CAPS
300.0000 mg | ORAL_CAPSULE | Freq: Three times a day (TID) | ORAL | Status: DC
  Administered 2016-12-21 – 2017-01-04 (×41): 300 mg via ORAL
  Filled 2016-12-21 (×2): qty 1
  Filled 2016-12-21: qty 3
  Filled 2016-12-21 (×38): qty 1

## 2016-12-21 MED ORDER — DEXAMETHASONE 6 MG PO TABS
6.0000 mg | ORAL_TABLET | Freq: Four times a day (QID) | ORAL | Status: DC
  Administered 2016-12-22 (×2): 6 mg via ORAL
  Filled 2016-12-21 (×2): qty 1

## 2016-12-21 MED ORDER — ACETAMINOPHEN 650 MG RE SUPP: 650.0000 mg | Freq: Four times a day (QID) | RECTAL | Status: DC | PRN

## 2016-12-21 MED ORDER — VITAMIN D 1000 UNITS PO TABS
2000.0000 [IU] | ORAL_TABLET | Freq: Every day | ORAL | Status: DC
  Administered 2016-12-22 – 2017-01-02 (×10): 2000 [IU] via ORAL
  Filled 2016-12-21 (×12): qty 2

## 2016-12-21 MED ORDER — BISACODYL 5 MG PO TBEC
5.0000 mg | DELAYED_RELEASE_TABLET | Freq: Every day | ORAL | Status: DC | PRN
  Administered 2016-12-27 – 2016-12-30 (×2): 5 mg via ORAL
  Filled 2016-12-21 (×2): qty 1

## 2016-12-21 MED ORDER — PANTOPRAZOLE SODIUM 40 MG PO TBEC
40.0000 mg | DELAYED_RELEASE_TABLET | Freq: Every day | ORAL | Status: DC
  Administered 2016-12-22 – 2017-01-04 (×13): 40 mg via ORAL
  Filled 2016-12-21 (×16): qty 1

## 2016-12-21 MED ORDER — SCOPOLAMINE 1 MG/3DAYS TD PT72
1.0000 | MEDICATED_PATCH | TRANSDERMAL | Status: DC
  Administered 2016-12-22: 1.5 mg via TRANSDERMAL
  Filled 2016-12-21: qty 1

## 2016-12-21 MED ORDER — OXYBUTYNIN 3.9 MG/24HR TD PTTW: 1.0000 | MEDICATED_PATCH | TRANSDERMAL | Status: DC

## 2016-12-21 MED ORDER — HYDROCODONE-ACETAMINOPHEN 5-325 MG PO TABS
1.0000 | ORAL_TABLET | ORAL | Status: DC | PRN
  Filled 2016-12-21 (×2): qty 2

## 2016-12-21 MED ORDER — ATORVASTATIN CALCIUM 20 MG PO TABS
20.0000 mg | ORAL_TABLET | Freq: Every evening | ORAL | Status: DC
  Administered 2016-12-22 – 2017-01-03 (×13): 20 mg via ORAL
  Filled 2016-12-21 (×13): qty 1

## 2016-12-21 MED ORDER — ONDANSETRON HCL 4 MG PO TABS
4.0000 mg | ORAL_TABLET | Freq: Four times a day (QID) | ORAL | Status: DC | PRN
  Administered 2016-12-21 – 2017-01-04 (×13): 4 mg via ORAL
  Filled 2016-12-21 (×14): qty 1

## 2016-12-21 MED ORDER — INSULIN ASPART 100 UNIT/ML ~~LOC~~ SOLN
0.0000 [IU] | Freq: Three times a day (TID) | SUBCUTANEOUS | Status: DC
  Administered 2016-12-22: 3 [IU] via SUBCUTANEOUS
  Administered 2016-12-22: 2 [IU] via SUBCUTANEOUS
  Administered 2016-12-22: 5 [IU] via SUBCUTANEOUS
  Administered 2016-12-23: 2 [IU] via SUBCUTANEOUS
  Administered 2016-12-23 – 2016-12-24 (×4): 3 [IU] via SUBCUTANEOUS
  Administered 2016-12-25: 2 [IU] via SUBCUTANEOUS
  Administered 2016-12-25 – 2016-12-26 (×2): 5 [IU] via SUBCUTANEOUS
  Administered 2016-12-26 – 2016-12-27 (×2): 3 [IU] via SUBCUTANEOUS
  Administered 2016-12-27 – 2016-12-28 (×2): 2 [IU] via SUBCUTANEOUS
  Administered 2016-12-28 – 2016-12-29 (×4): 3 [IU] via SUBCUTANEOUS
  Administered 2016-12-30: 5 [IU] via SUBCUTANEOUS
  Administered 2016-12-30: 3 [IU] via SUBCUTANEOUS
  Administered 2016-12-31: 2 [IU] via SUBCUTANEOUS
  Administered 2016-12-31 (×2): 3 [IU] via SUBCUTANEOUS
  Administered 2017-01-01: 2 [IU] via SUBCUTANEOUS
  Administered 2017-01-01: 5 [IU] via SUBCUTANEOUS
  Administered 2017-01-01 – 2017-01-02 (×2): 2 [IU] via SUBCUTANEOUS
  Administered 2017-01-02: 3 [IU] via SUBCUTANEOUS
  Administered 2017-01-02 – 2017-01-03 (×2): 2 [IU] via SUBCUTANEOUS
  Administered 2017-01-03: 3 [IU] via SUBCUTANEOUS
  Administered 2017-01-03: 5 [IU] via SUBCUTANEOUS

## 2016-12-21 MED ORDER — ONDANSETRON HCL 4 MG/2ML IJ SOLN
4.0000 mg | Freq: Four times a day (QID) | INTRAMUSCULAR | Status: DC | PRN
  Filled 2016-12-21: qty 2

## 2016-12-21 MED ORDER — LISINOPRIL 10 MG PO TABS
10.0000 mg | ORAL_TABLET | Freq: Every day | ORAL | Status: DC
  Administered 2016-12-22 – 2017-01-04 (×14): 10 mg via ORAL
  Filled 2016-12-21 (×14): qty 1

## 2016-12-21 MED ORDER — CELECOXIB 200 MG PO CAPS
200.0000 mg | ORAL_CAPSULE | Freq: Two times a day (BID) | ORAL | Status: DC
  Administered 2016-12-21 – 2017-01-04 (×28): 200 mg via ORAL
  Filled 2016-12-21 (×30): qty 1

## 2016-12-21 MED ORDER — METHOCARBAMOL 750 MG PO TABS: 750.0000 mg | ORAL_TABLET | Freq: Three times a day (TID) | ORAL | 2 refills | Status: DC

## 2016-12-21 MED ORDER — HYDROCODONE-ACETAMINOPHEN 5-325 MG PO TABS: 1.0000 | ORAL_TABLET | ORAL | 0 refills | Status: DC | PRN

## 2016-12-21 MED ORDER — SENNA 8.6 MG PO TABS
1.0000 | ORAL_TABLET | Freq: Two times a day (BID) | ORAL | Status: DC
  Administered 2016-12-21 – 2016-12-26 (×10): 8.6 mg via ORAL
  Filled 2016-12-21 (×10): qty 1

## 2016-12-21 MED ORDER — METHYLPREDNISOLONE 4 MG PO TBPK: ORAL_TABLET | ORAL | 0 refills | Status: DC

## 2016-12-21 MED ORDER — METFORMIN HCL 500 MG PO TABS
500.0000 mg | ORAL_TABLET | Freq: Two times a day (BID) | ORAL | Status: DC
  Administered 2016-12-22 – 2016-12-29 (×16): 500 mg via ORAL
  Filled 2016-12-21 (×16): qty 1

## 2016-12-21 MED ORDER — METHOCARBAMOL 750 MG PO TABS
750.0000 mg | ORAL_TABLET | Freq: Four times a day (QID) | ORAL | Status: DC
  Administered 2016-12-21 – 2017-01-04 (×52): 750 mg via ORAL
  Filled 2016-12-21 (×53): qty 1

## 2016-12-21 MED ORDER — SORBITOL 70 % SOLN
30.0000 mL | Freq: Every day | Status: DC | PRN
  Filled 2016-12-21 (×3): qty 30

## 2016-12-21 NOTE — Progress Notes (Signed)
Pt transferred to [facility] room 10, family at bedside.

## 2016-12-21 NOTE — Progress Notes (Signed)
I met with pt and her daughter at bedside to discuss a possible inpt rehab admission pending insurance approval. They are in agreement. I will begin insurance authorization with [employer] for possible admit today or tomorrow pending their approval. [phone]

## 2016-12-21 NOTE — Progress Notes (Signed)
qPhysical Therapy Treatment Patient Details Name: Megan [name] MRN: [id number] DOB: September 16, 1950 Today's Date: 12/21/2016    History of Present Illness Pt is a 66 y.o. female admitted to ED on 12/18/16 after an MRI at [facility] showed severe cord compression. Now s/p left T10 costotransversectomy, T10-11 laminectomy, left T10-11 discectomy, and T9-T12 dorsal internal fixation and fusion with bilateral pedicle screws on 4/8. Pertinent PMH includes DM, GERD, HLD, arthritis; pt also has sx history pertinent for L4-5 (2016) and L2-3 fusion (2017).     PT Comments    Patient progressing with standing tolerance and only +1 for transfers to [facility].  She demonstrates R>L LE weakness and increased R LE extensor tone at times.  Standing she reports L LE extension needed for stability, but able to transfer weight today with assist for R knee extension.  Feel continued skilled PT in acute setting indicated prior to d/c to CIR level rehab.  Follow Up Recommendations  CIR;Supervision for mobility/OOB     Equipment Recommendations  None recommended by PT    Recommendations for Other Services       Precautions / Restrictions Precautions Precautions: Back Required Braces or Orthoses: Spinal Brace Spinal Brace: Thoracolumbosacral orthotic;Applied in sitting position    Mobility  Bed Mobility Overal bed mobility: Needs Assistance Bed Mobility: Rolling;Sidelying to Sit;Sit to Sidelying Rolling: Min assist Sidelying to sit: Mod assist     Sit to sidelying: Mod assist General bed mobility comments: assist for R leg flexion to initiate rolling and cues for railing, hand placement, assist for legs off bed and pt able to push along side of bed with hands to come upright, to sidelying, assist for legs,  Transfers Overall transfer level: Needs assistance   Transfers: Sit to/from Stand Sit to Stand: Mod assist         General transfer comment: performed x 3 reps with cues for posture, R LE  positioning (with assist); lifting assist and tactile cues for upright  Ambulation/Gait                 Stairs            Wheelchair Mobility    Modified Rankin (Stroke Patients Only)       Balance     Sitting balance-Leahy Scale: Poor Sitting balance - Comments: Pt requires bil. Ue support.  able to reach within BOS in sitting with single UE support   Standing balance support: Bilateral upper extremity supported;During functional activity Standing balance-Leahy Scale: Poor Standing balance comment: stood at [facility] 2 x about 1-2 minutes with lateral weight shifts, assist for R LE stability in standing                            Cognition Arousal/Alertness: Awake/alert Behavior During Therapy: WFL for tasks assessed/performed Overall Cognitive Status: Within Functional Limits for tasks assessed                                        Exercises Other Exercises Other Exercises: heel cord, glut, hamstring stretches 2-3 x 30 sec Other Exercises: lower trunk rotation x 10 Other Exercises: closed chain hip and knee extension with quick stretch x 3-5 Bilat LE    General Comments General comments (skin integrity, edema, etc.): Spouse in room, educated on LE stretching and trunk/LE rotation for muscle relaxation if spasms  Pertinent Vitals/Pain Pain Assessment: Faces Faces Pain Scale: Hurts little more Pain Location: Back and Rt rib area  Pain Descriptors / Indicators: Aching;Grimacing Pain Intervention(s): Monitored during session;Limited activity within patient's tolerance;Patient requesting pain meds-RN notified;Repositioned    Home Living     Available Help at Discharge:  (spouse, neighbor and daughter can do 24/7)                Prior Function            PT Goals (current goals can now be found in the care plan section) Acute Rehab PT Goals Patient Stated Goal: go to rehab PT Goal Formulation: With  patient/family Progress towards PT goals: Progressing toward goals    Frequency    Min 5X/week      PT Plan Current plan remains appropriate    Co-evaluation             End of Session Equipment Utilized During Treatment: Back brace Activity Tolerance: Patient tolerated treatment well Patient left: in bed;with call bell/phone within reach;with family/visitor present   PT Visit Diagnosis: Other abnormalities of gait and mobility (R26.89);Muscle weakness (generalized) (M62.81)     Time: 1100-3496 PT Time Calculation (min) (ACUTE ONLY): 39 min  Charges:  $Therapeutic Exercise: 8-22 mins $Therapeutic Activity: 23-37 mins                    G CodesMagda [name], [place] [phone] 12/21/2016   Megan [name] 12/21/2016, 4:19 PM

## 2016-12-21 NOTE — Progress Notes (Signed)
Ankit Lorie Phenix, MD Physician Signed Physical Medicine and Rehabilitation  Consult Note Date of Service: 12/21/2016 5:48 AM  Related encounter: ED to Hosp-Admission (Current) from 12/18/2016 in [place] All Collapse All   '[]'$ Hide copied text '[]'$ Hover for attribution information      Physical Medicine and Rehabilitation Consult Reason for Consult: Progressive paraparesis Referring Physician: Dr. Cyndy Freeze   HPI: Megan [name] is a 66 y.o. right handed female with history of type 2 diabetes mellitus, hyperlipidemia, lumbar fusion L4-5 2016 and L2-3 2017, cervical fusion 2001. Per chart review patient lives with spouse. One level home 1 step to entry. She had been using a scooter for household and community ambulation secondary to bilateral lower extremity weakness for the past few weeks prior to she had used a rolling walker for ambulation. Husband works during the day as well as a local daughter who also works, but husband states he can provide Nurse, mental health. Presented 12/18/2016 with long-standing history of neck and back pain and progressive paraparesis. She denied any bowel or bladder dysfunction. 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 Underwent left T10 costotransversectomy, T10-11 laminectomy for decompression, dorsal internal fixation and fusion with bilateral pedicle screws T9-T12 12/19/2016 per Dr. Cyndy Freeze. Hospital course pain management. TLSO back brace applied in sitting position. Pt on Vanc for surgery and drain.     Review of Systems  Constitutional: Negative for chills and fever.  HENT: Negative for hearing loss.   Eyes: Negative for blurred vision and double vision.  Respiratory: Negative for cough and shortness of breath.   Cardiovascular: Negative for chest pain, palpitations and leg swelling.  Gastrointestinal: Positive for constipation.  Negative for nausea and vomiting.       GERD  Genitourinary: Negative for dysuria and hematuria.  Musculoskeletal: Positive for back pain and myalgias.  Skin: Negative for rash.  Neurological: Positive for sensory change, focal weakness and weakness.  All other systems reviewed and are negative.      Past Medical History:  Diagnosis Date  . Arthritis   . Borderline diabetic   . GERD (gastroesophageal reflux disease)   . History of bronchitis 2005  . History of kidney stones   . History of shingles   . Hyperlipidemia    takes Fish Oil daily  . Neuromuscular disorder (HCC)    tingling toes  . 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[place]) 12/15/2016  . Pneumonia 2009  . PONV (postoperative nausea and vomiting)   . Restless leg   . Type 2 diabetes mellitus ([place])   . Weakness    numbness and tingling in both feet r/t back        Past Surgical History:  Procedure Laterality Date  . ABDOMINAL HYSTERECTOMY  1979  . blADder tacked   1991  . CERVICAL FUSION     2001  . HERNIA REPAIR  1991  . LUMBAR FUSION  2016   L4-5  . LUMBAR FUSION  2017   L2-3  . TONSILLECTOMY AND ADENOIDECTOMY  1959  . TUMOR EXCISION     WERTHIN'S TUMORS BOTH SIDES OF NECK         Family History  Problem Relation Age of Onset  . Diabetes Mother   . Dementia Mother   . Cirrhosis Mother     Non alcoholic  . COPD Father   . Diabetes Brother    Social History:  reports that she  quit smoking about 10 years ago. Her smoking use included Cigarettes. She smoked 1.50 packs per day. She has never used smokeless tobacco. She reports that she drinks alcohol. She reports that she does not use drugs. Allergies:       Allergies  Allergen Reactions  . Shellfish Allergy Anaphylaxis, Swelling and Other (See Comments)    Tongue swells  . Oxycodone Nausea Only  . Penicillins Rash    Has patient had a PCN reaction causing immediate rash, facial/tongue/throat swelling, SOB or  lightheadedness with hypotension: Yes Has patient had a PCN reaction causing severe rash involving mucus membranes or skin necrosis: No Has patient had a PCN reaction that required hospitalization No Has patient had a PCN reaction occurring within the last 10 years: No If all of the above answers are "NO", then may proceed with Cephalosporin use.         Medications Prior to Admission  Medication Sig Dispense Refill  . atorvastatin (LIPITOR) 20 MG tablet Take 1 tablet (20 mg total) by mouth every evening. (Patient taking differently: Take 20 mg by mouth daily after lunch. ) 90 tablet 3  . cholecalciferol (VITAMIN D) 1000 units tablet Take 1,000 Units by mouth daily after lunch.     . Coenzyme Q10 (COQ10 PO) Take 1 capsule by mouth daily after supper.    . Cranberry 500 MG TABS Take 500 mg by mouth daily after supper.    . dexamethasone (DECADRON) 2 MG tablet Take 8 mg by mouth daily after lunch. 30 day course started 12/18/16  0  . gabapentin (NEURONTIN) 300 MG capsule Take 300 mg by mouth 2 (two) times daily.     Marland Kitchen ibuprofen (ADVIL,MOTRIN) 200 MG tablet Take 400 mg by mouth every 6 (six) hours as needed for headache.     . lisinopril (PRINIVIL,ZESTRIL) 10 MG tablet Take 1 tablet (10 mg total) by mouth daily. (Patient taking differently: Take 10 mg by mouth daily after lunch. ) 30 tablet 0  . metFORMIN (GLUCOPHAGE) 500 MG tablet Take 1 tablet by mouth once daily with food for 2 weeks, then advance to 1 tablet twice daily with food. (Patient taking differently: Take 500 mg by mouth 2 (two) times daily with a meal. ) 180 tablet 0  . Multiple Vitamin (MULTIVITAMIN WITH MINERALS) TABS tablet Take 1 tablet by mouth daily after lunch.     . oxybutynin (OXYTROL) 3.9 MG/24HR Place 1 patch onto the skin See admin instructions. Apply 1 patch (3.9 mg) transdermally once every 5 days, leave on for 5 days and then remove and replace    . traMADol (ULTRAM) 50 MG tablet Take 50 mg by mouth 2 (two)  times daily as needed (pain).       Home: Home Living Family/patient expects to be discharged to:: Private residence Living Arrangements: Spouse/significant other Available Help at Discharge: Family, Available 24 hours/day Type of Home: House Home Access: Stairs to enter [employer] of Steps: 1 Entrance Stairs-Rails: None Home Layout: One level Bathroom Shower/Tub: Tub/shower unit, Door, Architectural technologist: Standard Bathroom Accessibility: Yes Home Equipment: Civil engineer, contracting, Environmental consultant - 2 wheels, Cane - single point, Environmental consultant - 4 wheels, Grab bars - tub/shower Additional Comments: Pt has been using scooter at home and it does fit through bathroom door   Functional History: Prior Function Level of Independence: Needs assistance Gait / Transfers Assistance Needed: Pt has had progressive weakness over past few months - for past 3 weeks, has required scooter for household and community  amb secondary to BLE weakness; requires intermittent assist from husband to transfer to/from. Prior to this, she required RW for amb.  ADL's / Homemaking Assistance Needed: Husband performs cooking/cleaning; provides intermittent assist for dressing, bathing. Pt indep with pericare. Comments: Pt reports progressive decline in function due to weakenss over the past 3+ weeks.  Prior to that, pt was mod I and ambulatory  Functional Status:  Mobility: Bed Mobility Overal bed mobility: Needs Assistance Bed Mobility: Rolling, Sidelying to Sit, Sit to Sidelying Rolling: Min assist Sidelying to sit: Max assist Sit to sidelying: Max assist General bed mobility comments: Pt requires cues for log rolling.  Assist to flex bil. knees and assist to lift trunk  Transfers Overall transfer level: Needs assistance Equipment used: Rolling walker (2 wheeled) Transfers: Sit to/from Stand Sit to Stand: Mod assist, +2 physical assistance General transfer comment: Pt stood x 3 with mod A +2, however, Rt knee  flexed spontaneously resulting in no contact of Rt LE with the ground.  With input through Rt knee, she was unable to assist or extend Rt LE.  She was unable to achieve full extension of Lt LE with Lt LE sliding out from under her  Ambulation/Gait Ambulation/Gait assistance: +2 physical assistance, Mod assist Ambulation Distance (Feet): 2 Feet Assistive device: Rolling walker (2 wheeled) Gait Pattern/deviations: Step-to pattern General Gait Details: Amb a few steps to chair with RW and modA +2 for trunk support and physical assist to move R foot; pt with increased difficulty clearing RLE to bring it forward, better able to slide RLE backwards.   ADL: ADL Overall ADL's : Needs assistance/impaired Eating/Feeding: Independent Grooming: Wash/dry hands, Wash/dry face, Oral care, Brushing hair, Set up, Bed level Upper Body Bathing: Moderate assistance, Bed level, Standing Lower Body Bathing: Maximal assistance, Bed level Upper Body Dressing : Maximal assistance, Sitting Lower Body Dressing: Total assistance, Bed level Toilet Transfer: Maximal assistance, +2 for physical assistance, Stand-pivot, BSC Toileting- Clothing Manipulation and Hygiene: Total assistance Functional mobility during ADLs: Moderate assistance, Maximal assistance, +2 for physical assistance  Cognition: Cognition Overall Cognitive Status: Within Functional Limits for tasks assessed Orientation Level: Oriented X4 Cognition Arousal/Alertness: Awake/alert Behavior During Therapy: WFL for tasks assessed/performed Overall Cognitive Status: Within Functional Limits for tasks assessed  Blood pressure (!) 147/67, pulse (!) 55, temperature 97.5 F (36.4 C), temperature source Oral, resp. rate 12, height '5\' 8"'$  (1.727 m), weight 74.8 kg (164 lb 12.8 oz), SpO2 94 %. Physical Exam  Vitals reviewed. Constitutional: She is oriented to person, place, and time. She appears well-developed and well-nourished.  HENT:  Head:  Normocephalic and atraumatic.  Eyes: Conjunctivae and EOM are normal. Right eye exhibits no discharge. Left eye exhibits no discharge.  Neck: Normal range of motion. Neck supple. No thyromegaly present.  Cardiovascular: Normal rate and regular rhythm.   Respiratory: Effort normal and breath sounds normal. No respiratory distress.  GI: Soft. Bowel sounds are normal. She exhibits no distension.  Musculoskeletal:  No edema or tenderness in extremities  Neurological: She is alert and oriented to person, place, and time.  Motor (pain inhibition): B/l UE 5/5 LLE: 4/5 proximal to distal RLE: 2/5 proximal to distal Sensation diminished to light touch LLE DTRs 3+ LLE, 0 RLE  Skin: Skin is warm and dry.  Back incision is dressed  Psychiatric: She has a normal mood and affect. Her behavior is normal.    Lab Results Last 24 Hours       Results for orders placed or performed  during the hospital encounter of 12/18/16 (from the past 24 hour(s))  CBC     Status: Abnormal   Collection Time: 12/20/16  6:00 AM  Result Value Ref Range   WBC 11.8 (H) 4.0 - 10.5 K/uL   RBC 3.47 (L) 3.87 - 5.11 MIL/uL   Hemoglobin 10.4 (L) 12.0 - 15.0 g/dL   HCT 30.6 (L) 36.0 - 46.0 %   MCV 88.2 78.0 - 100.0 fL   MCH 30.0 26.0 - 34.0 pg   MCHC 34.0 30.0 - 36.0 g/dL   RDW 13.8 11.5 - 15.5 %   Platelets 185 150 - 400 K/uL  Basic Metabolic Panel     Status: Abnormal   Collection Time: 12/20/16  6:00 AM  Result Value Ref Range   Sodium 136 135 - 145 mmol/L   Potassium 4.0 3.5 - 5.1 mmol/L   Chloride 105 101 - 111 mmol/L   CO2 22 22 - 32 mmol/L   Glucose, Bld 194 (H) 65 - 99 mg/dL   BUN 14 6 - 20 mg/dL   Creatinine, Ser 0.84 0.44 - 1.00 mg/dL   Calcium 8.6 (L) 8.9 - 10.3 mg/dL   GFR calc non Af Amer >60 >60 mL/min   GFR calc Af Amer >60 >60 mL/min   Anion gap 9 5 - 15  Glucose, capillary     Status: Abnormal   Collection Time: 12/20/16  7:45 AM  Result Value Ref Range    Glucose-Capillary 158 (H) 65 - 99 mg/dL   Comment 1 Notify RN    Comment 2 Document in Chart   Glucose, capillary     Status: Abnormal   Collection Time: 12/20/16 11:22 AM  Result Value Ref Range   Glucose-Capillary 238 (H) 65 - 99 mg/dL  Glucose, capillary     Status: Abnormal   Collection Time: 12/20/16  4:53 PM  Result Value Ref Range   Glucose-Capillary 207 (H) 65 - 99 mg/dL   Comment 1 Notify RN    Comment 2 Document in Chart   Glucose, capillary     Status: Abnormal   Collection Time: 12/20/16 10:20 PM  Result Value Ref Range   Glucose-Capillary 199 (H) 65 - 99 mg/dL      Imaging Results (Last 48 hours)  Dg Thoracic Spine 2 View  Result Date: 12/19/2016 CLINICAL DATA:  T10 transverse ectomy, at T9-10 discectomy, T9-12 PLIF EXAM: THORACIC SPINE 2 VIEWS; DG C-ARM 61-120 MIN COMPARISON:  CT thoracic spine dated 12/18/2016. FLUOROSCOPY TIME:  22 seconds FINDINGS: Single frontal and lateral fluoroscopic radiographs demonstrate T9-12 PLIF surgical hardware in satisfactory position. Additional lumbar spine posterior fixation hardware starting at L2, incompletely visualized. IMPRESSION: Intraoperative fluoroscopic images during T9-12 PLIF, as above. Electronically Signed   By: Julian Hy M.D.   On: 12/19/2016 15:58   Dg C-arm 1-60 Min  Result Date: 12/19/2016 CLINICAL DATA:  T10 transverse ectomy, at T9-10 discectomy, T9-12 PLIF EXAM: THORACIC SPINE 2 VIEWS; DG C-ARM 61-120 MIN COMPARISON:  CT thoracic spine dated 12/18/2016. FLUOROSCOPY TIME:  22 seconds FINDINGS: Single frontal and lateral fluoroscopic radiographs demonstrate T9-12 PLIF surgical hardware in satisfactory position. Additional lumbar spine posterior fixation hardware starting at L2, incompletely visualized. IMPRESSION: Intraoperative fluoroscopic images during T9-12 PLIF, as above. Electronically Signed   By: Julian Hy M.D.   On: 12/19/2016 15:58     Assessment/Plan: Diagnosis: Thoracic  myelopathy s/p fusion/fixation Labs and images independently reviewed.  Records reviewed and summated above.     Psych: psychology consult  for adjustment to disability for pt and family     Spasticity: may develop spasticity. Manage spasticity only if indicated     (pain, hygiene, prevention of contractures, functional impairment).     Pain Management:  control with oral medications if possible     Bladder:  Monitor for neurogenic bladder. May confirm this with serial PVRs to r/o retention/atonic bladder. In/out clean catherization. Implement bladder program. Encourage self I&O cath training vs indwelling foley if possible to improve mobility, reduce infection, and     increase safety     Bowel: Implement mechanical and chemical bowel program and care training with scheduled suppository 30 min to 1 hour after meals to utilize gastrocolic and colorectal reflexes.   1. Does the need for close, 24 hr/day medical supervision in concert with the patient's rehab needs make it unreasonable for this patient to be served in a less intensive setting? Yes  2. Co-Morbidities requiring supervision/potential complications: type 2 diabetes mellitus (Monitor in accordance with exercise and adjust meds as necessary), hyperlipidemia (cont meds), lumbar fusion L4-5 2016 and L2-3 2017, cervical fusion, neuropathic pain (cont meds), post-op pain (Biofeedback training with therapies to help reduce reliance on opiate pain medications, monitor pain control during therapies, and sedation at rest and titrate to maximum efficacy to ensure participation and gains in therapies), Surgery with drain (wean Vanc when appropriate) 3. Due to bladder management, bowel management, safety, skin/wound care, disease management, pain management and patient education, does the patient require 24 hr/day rehab nursing? Yes 4. Does the patient require coordinated care of a physician, rehab nurse, PT (1-2 hrs/day, 5 days/week) and OT (1-2 hrs/day, 5  days/week) to address physical and functional deficits in the context of the above medical diagnosis(es)? Yes Addressing deficits in the following areas: balance, endurance, locomotion, strength, transferring, bowel/bladder control, bathing, dressing, toileting and psychosocial support 5. Can the patient actively participate in an intensive therapy program of at least 3 hrs of therapy per day at least 5 days per week? Yes 6. The potential for patient to make measurable gains while on inpatient rehab is excellent 7. Anticipated functional outcomes upon discharge from inpatient rehab are min assist  with PT, min assist with OT, n/a with SLP. 8. Estimated rehab length of stay to reach the above functional goals is: 15-20 days. 9. Does the patient have adequate social supports and living environment to accommodate these discharge functional goals? Yes 10. Anticipated D/C setting: Home 11. Anticipated post D/C treatments: HH therapy and Home excercise program 12. Overall Rehab/Functional Prognosis: good  RECOMMENDATIONS: This patient's condition is appropriate for continued rehabilitative care in the following setting: CIR Patient has agreed to participate in recommended program. Yes Note that insurance prior authorization may be required for reimbursement for recommended care.  Comment: Rehab Admissions Coordinator to follow up.  Delice Lesch, MD, Mellody Drown Cathlyn Parsons., PA-C 12/21/2016    Revision History                        Routing History

## 2016-12-21 NOTE — Progress Notes (Addendum)
Pt seen and examined.  No issues overnight. She is without any concerns Pain is well controlled  EXAM: Temp:  [97.5 F (36.4 C)-98.8 F (37.1 C)] 98.2 F (36.8 C) (04/10 0800) Pulse Rate:  [50-88] 76 (04/10 0940) Resp:  [11-19] 15 (04/10 0940) BP: (121-173)/(59-92) 154/92 (04/10 0940) SpO2:  [94 %-100 %] 98 % (04/10 0940) Intake/Output      04/09 0701 - 04/10 0700 04/10 0701 - 04/11 0700   P.O. 720 240   I.V. (mL/kg) 2500 (33.4) 200 (2.7)   Other 1060    IV Piggyback 400    Total Intake(mL/kg) 4680 (62.6) 440 (5.9)   Urine (mL/kg/hr) 1300 (0.7) 400 (1)   Drains 160 (0.1)    Blood     Total Output 1460 400   Net +3220 +40         Awake and alert Follows commands throughout Back to pre-op neurological function Left HF 2/5 otherwise 3-4/5 Right leg 1/5 distally otherwise 0/5  Stable Continue current care Transferred to floor Continue to work with PT/OT  I have seen and evaluated the patient and agree with the above.  Medically stable for CIR when accepted.

## 2016-12-21 NOTE — Discharge Summary (Signed)
Physician Discharge Summary  Patient ID: Megan [name] MRN: [id number] DOB/AGE: 03/11/1951 66 y.o.  Admit date: 12/18/2016 Discharge date: 12/21/2016  PRE-OPERATIVE DIAGNOSIS:  Thoracic myelopathy; progressive paraparesis; large left T10-11 herniated nucleus pulposus with severe stenosis  POST-OPERATIVE DIAGNOSIS:  Same  PROCEDURE:  Left T10 costotransversectomy, T10-11 laminectomy for decompression, left T10-11 discectomy, dorsal internal fixation and fusion with bilateral pedicle screws T9-T12, use of operating microscope, use of BMP  Discharge Diagnoses:  Same Active Problems:   Spondylogenic compression of thoracic spinal cord   Surgery, elective   Diabetes mellitus type 2 in nonobese ([place])   History of lumbar fusion   History of fusion of cervical spine   Neuropathic pain   Post-operative pain   Discharged Condition: Stable  Hospital Course:  Megan [name] is a 66 y.o. female who was admitted for the above procedure listed above. She had an uncomplicated post operative course. She was discharged in stable condition. Tolerating po. Strength of LE at baseline.   Discharge Exam: Blood pressure (!) 160/74, pulse 70, temperature 98.1 F (36.7 C), temperature source Oral, resp. rate 16, height '5\' 8"'$  (1.727 m), weight 74.8 kg (164 lb 12.8 oz), SpO2 100 %. Awake and alert Follows commands throughout Back to pre-op neurological function Left HF 2/5 otherwise 3-4/5 Right leg 1/5 distally otherwise 0/5  Disposition: CIR  Discharge Instructions    Call MD for:  difficulty breathing, headache or visual disturbances    Complete by:  As directed    Call MD for:  persistant dizziness or light-headedness    Complete by:  As directed    Call MD for:  redness, tenderness, or signs of infection (pain, swelling, redness, odor or green/yellow discharge around incision site)    Complete by:  As directed    Call MD for:  severe uncontrolled pain    Complete by:  As directed    Call MD for:  temperature >100.4    Complete by:  As directed    Diet general    Complete by:  As directed    Driving Restrictions    Complete by:  As directed    Do not drive until given clearance.   Increase activity slowly    Complete by:  As directed    Lifting restrictions    Complete by:  As directed    Do not lift anything >10lbs. Avoid bending and twisting in awkward positions. Avoid bending at the back.   May shower / Bathe    Complete by:  As directed    Okay to wash wound with warm soapy water. Avoid scrubbing the wound. Pat dry.   Remove dressing in 24 hours    Complete by:  As directed      Allergies as of 12/21/2016      Reactions   Shellfish Allergy Anaphylaxis, Swelling, Other (See Comments)   Tongue swells   Oxycodone Nausea Only   Penicillins Rash   Has patient had a PCN reaction causing immediate rash, facial/tongue/throat swelling, SOB or lightheadedness with hypotension: Yes Has patient had a PCN reaction causing severe rash involving mucus membranes or skin necrosis: No Has patient had a PCN reaction that required hospitalization No Has patient had a PCN reaction occurring within the last 10 years: No If all of the above answers are "NO", then may proceed with Cephalosporin use.      Medication List    TAKE these medications   atorvastatin 20 MG tablet Commonly known as:  LIPITOR  Take 1 tablet (20 mg total) by mouth every evening. What changed:  when to take this   cholecalciferol 1000 units tablet Commonly known as:  VITAMIN D Take 1,000 Units by mouth daily after lunch.   COQ10 PO Take 1 capsule by mouth daily after supper.   Cranberry 500 MG Tabs Take 500 mg by mouth daily after supper.   dexamethasone 2 MG tablet Commonly known as:  DECADRON Take 8 mg by mouth daily after lunch. 30 day course started 12/18/16   gabapentin 300 MG capsule Commonly known as:  NEURONTIN Take 300 mg by mouth 2 (two) times daily.   HYDROcodone-acetaminophen  5-325 MG tablet Commonly known as:  NORCO/VICODIN Take 1-2 tablets by mouth every 4 (four) hours as needed for moderate pain.   ibuprofen 200 MG tablet Commonly known as:  ADVIL,MOTRIN Take 400 mg by mouth every 6 (six) hours as needed for headache.   lisinopril 10 MG tablet Commonly known as:  PRINIVIL,ZESTRIL Take 1 tablet (10 mg total) by mouth daily. What changed:  when to take this   metFORMIN 500 MG tablet Commonly known as:  GLUCOPHAGE Take 1 tablet by mouth once daily with food for 2 weeks, then advance to 1 tablet twice daily with food. What changed:  how much to take  how to take this  when to take this  additional instructions   methocarbamol 750 MG tablet Commonly known as:  ROBAXIN Take 1 tablet (750 mg total) by mouth 3 (three) times daily.   methylPREDNISolone 4 MG Tbpk tablet Commonly known as:  MEDROL Take according to package inserts   multivitamin with minerals Tabs tablet Take 1 tablet by mouth daily after lunch.   oxybutynin 3.9 MG/24HR Commonly known as:  OXYTROL Place 1 patch onto the skin See admin instructions. Apply 1 patch (3.9 mg) transdermally once every 5 days, leave on for 5 days and then remove and replace   traMADol 50 MG tablet Commonly known as:  ULTRAM Take 50 mg by mouth 2 (two) times daily as needed (pain).         SignedTraci Sermon 12/21/2016, 5:19 PM

## 2016-12-21 NOTE — Progress Notes (Signed)
I have insurance approval and bed available to admit pt today. I contacted Dr. Cyndy Freeze in [place] and he is in agreement to d/c today. I will make the arrangements. RN CM made aware. [phone]

## 2016-12-21 NOTE — Progress Notes (Signed)
Report called to [facility] on [facility] ten

## 2016-12-21 NOTE — Progress Notes (Signed)
Hemovac removed from upper surgical site without complication. No hemorrhage, no adverse responses. No sensory alterations reported. Lying on side as instructed. Covered with gauze hypafix.

## 2016-12-21 NOTE — Consult Note (Signed)
Physical Medicine and Rehabilitation Consult Reason for Consult: Progressive paraparesis Referring Physician: Dr. Cyndy Freeze   HPI: Megan [name] is a 66 y.o. right handed female with history of type 2 diabetes mellitus, hyperlipidemia, lumbar fusion L4-5 2016 and L2-3 2017, cervical fusion 2001. Per chart review patient lives with spouse. One level home 1 step to entry. She had been using a scooter for household and community ambulation secondary to bilateral lower extremity weakness for the past few weeks prior to she had used a rolling walker for ambulation. Husband works during the day as well as a local daughter who also works, but husband states he can provide Nurse, mental health. Presented 12/18/2016 with long-standing history of neck and back pain and progressive paraparesis. She denied any bowel or bladder dysfunction. 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 Underwent left T10 costotransversectomy, T10-11 laminectomy for decompression, dorsal internal fixation and fusion with bilateral pedicle screws T9-T12 12/19/2016 per Dr. Cyndy Freeze. Hospital course pain management. TLSO back brace applied in sitting position. Pt on Vanc for surgery and drain.     Review of Systems  Constitutional: Negative for chills and fever.  HENT: Negative for hearing loss.   Eyes: Negative for blurred vision and double vision.  Respiratory: Negative for cough and shortness of breath.   Cardiovascular: Negative for chest pain, palpitations and leg swelling.  Gastrointestinal: Positive for constipation. Negative for nausea and vomiting.       GERD  Genitourinary: Negative for dysuria and hematuria.  Musculoskeletal: Positive for back pain and myalgias.  Skin: Negative for rash.  Neurological: Positive for sensory change, focal weakness and weakness.  All other systems reviewed and are negative.  Past Medical History:  Diagnosis Date  .  Arthritis   . Borderline diabetic   . GERD (gastroesophageal reflux disease)   . History of bronchitis 2005  . History of kidney stones   . History of shingles   . Hyperlipidemia    takes Fish Oil daily  . Neuromuscular disorder (HCC)    tingling toes  . 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[place]) 12/15/2016  . Pneumonia 2009  . PONV (postoperative nausea and vomiting)   . Restless leg   . Type 2 diabetes mellitus ([place])   . Weakness    numbness and tingling in both feet r/t back   Past Surgical History:  Procedure Laterality Date  . ABDOMINAL HYSTERECTOMY  1979  . blADder tacked   1991  . CERVICAL FUSION     2001  . HERNIA REPAIR  1991  . LUMBAR FUSION  2016   L4-5  . LUMBAR FUSION  2017   L2-3  . TONSILLECTOMY AND ADENOIDECTOMY  1959  . TUMOR EXCISION     WERTHIN'S TUMORS BOTH SIDES OF NECK   Family History  Problem Relation Age of Onset  . Diabetes Mother   . Dementia Mother   . Cirrhosis Mother     Non alcoholic  . COPD Father   . Diabetes Brother    Social History:  reports that she quit smoking about 10 years ago. Her smoking use included Cigarettes. She smoked 1.50 packs per day. She has never used smokeless tobacco. She reports that she drinks alcohol. She reports that she does not use drugs. Allergies:  Allergies  Allergen Reactions  . Shellfish Allergy Anaphylaxis, Swelling and Other (See Comments)    Tongue swells  . Oxycodone Nausea Only  . Penicillins Rash  Has patient had a PCN reaction causing immediate rash, facial/tongue/throat swelling, SOB or lightheadedness with hypotension: Yes Has patient had a PCN reaction causing severe rash involving mucus membranes or skin necrosis: No Has patient had a PCN reaction that required hospitalization No Has patient had a PCN reaction occurring within the last 10 years: No If all of the above answers are "NO", then may proceed with Cephalosporin use.   Medications Prior to Admission  Medication Sig Dispense Refill   . atorvastatin (LIPITOR) 20 MG tablet Take 1 tablet (20 mg total) by mouth every evening. (Patient taking differently: Take 20 mg by mouth daily after lunch. ) 90 tablet 3  . cholecalciferol (VITAMIN D) 1000 units tablet Take 1,000 Units by mouth daily after lunch.     . Coenzyme Q10 (COQ10 PO) Take 1 capsule by mouth daily after supper.    . Cranberry 500 MG TABS Take 500 mg by mouth daily after supper.    . dexamethasone (DECADRON) 2 MG tablet Take 8 mg by mouth daily after lunch. 30 day course started 12/18/16  0  . gabapentin (NEURONTIN) 300 MG capsule Take 300 mg by mouth 2 (two) times daily.     Marland Kitchen ibuprofen (ADVIL,MOTRIN) 200 MG tablet Take 400 mg by mouth every 6 (six) hours as needed for headache.     . lisinopril (PRINIVIL,ZESTRIL) 10 MG tablet Take 1 tablet (10 mg total) by mouth daily. (Patient taking differently: Take 10 mg by mouth daily after lunch. ) 30 tablet 0  . metFORMIN (GLUCOPHAGE) 500 MG tablet Take 1 tablet by mouth once daily with food for 2 weeks, then advance to 1 tablet twice daily with food. (Patient taking differently: Take 500 mg by mouth 2 (two) times daily with a meal. ) 180 tablet 0  . Multiple Vitamin (MULTIVITAMIN WITH MINERALS) TABS tablet Take 1 tablet by mouth daily after lunch.     . oxybutynin (OXYTROL) 3.9 MG/24HR Place 1 patch onto the skin See admin instructions. Apply 1 patch (3.9 mg) transdermally once every 5 days, leave on for 5 days and then remove and replace    . traMADol (ULTRAM) 50 MG tablet Take 50 mg by mouth 2 (two) times daily as needed (pain).       Home: Home Living Family/patient expects to be discharged to:: Private residence Living Arrangements: Spouse/significant other Available Help at Discharge: Family, Available 24 hours/day Type of Home: House Home Access: Stairs to enter [employer] of Steps: 1 Entrance Stairs-Rails: None Home Layout: One level Bathroom Shower/Tub: Tub/shower unit, Door, Architectural technologist:  Standard Bathroom Accessibility: Yes Home Equipment: Civil engineer, contracting, Environmental consultant - 2 wheels, Cane - single point, Environmental consultant - 4 wheels, Grab bars - tub/shower Additional Comments: Pt has been using scooter at home and it does fit through bathroom door   Functional History: Prior Function Level of Independence: Needs assistance Gait / Transfers Assistance Needed: Pt has had progressive weakness over past few months - for past 3 weeks, has required scooter for household and community amb secondary to BLE weakness; requires intermittent assist from husband to transfer to/from. Prior to this, she required RW for amb.  ADL's / Homemaking Assistance Needed: Husband performs cooking/cleaning; provides intermittent assist for dressing, bathing. Pt indep with pericare. Comments: Pt reports progressive decline in function due to weakenss over the past 3+ weeks.  Prior to that, pt was mod I and ambulatory  Functional Status:  Mobility: Bed Mobility Overal bed mobility: Needs Assistance Bed Mobility: Rolling, Sidelying  to Sit, Sit to Sidelying Rolling: Min assist Sidelying to sit: Max assist Sit to sidelying: Max assist General bed mobility comments: Pt requires cues for log rolling.  Assist to flex bil. knees and assist to lift trunk  Transfers Overall transfer level: Needs assistance Equipment used: Rolling walker (2 wheeled) Transfers: Sit to/from Stand Sit to Stand: Mod assist, +2 physical assistance General transfer comment: Pt stood x 3 with mod A +2, however, Rt knee flexed spontaneously resulting in no contact of Rt LE with the ground.  With input through Rt knee, she was unable to assist or extend Rt LE.  She was unable to achieve full extension of Lt LE with Lt LE sliding out from under her  Ambulation/Gait Ambulation/Gait assistance: +2 physical assistance, Mod assist Ambulation Distance (Feet): 2 Feet Assistive device: Rolling walker (2 wheeled) Gait Pattern/deviations: Step-to pattern General  Gait Details: Amb a few steps to chair with RW and modA +2 for trunk support and physical assist to move R foot; pt with increased difficulty clearing RLE to bring it forward, better able to slide RLE backwards.     ADL: ADL Overall ADL's : Needs assistance/impaired Eating/Feeding: Independent Grooming: Wash/dry hands, Wash/dry face, Oral care, Brushing hair, Set up, Bed level Upper Body Bathing: Moderate assistance, Bed level, Standing Lower Body Bathing: Maximal assistance, Bed level Upper Body Dressing : Maximal assistance, Sitting Lower Body Dressing: Total assistance, Bed level Toilet Transfer: Maximal assistance, +2 for physical assistance, Stand-pivot, BSC Toileting- Clothing Manipulation and Hygiene: Total assistance Functional mobility during ADLs: Moderate assistance, Maximal assistance, +2 for physical assistance  Cognition: Cognition Overall Cognitive Status: Within Functional Limits for tasks assessed Orientation Level: Oriented X4 Cognition Arousal/Alertness: Awake/alert Behavior During Therapy: WFL for tasks assessed/performed Overall Cognitive Status: Within Functional Limits for tasks assessed  Blood pressure (!) 147/67, pulse (!) 55, temperature 97.5 F (36.4 C), temperature source Oral, resp. rate 12, height '5\' 8"'$  (1.727 m), weight 74.8 kg (164 lb 12.8 oz), SpO2 94 %. Physical Exam  Vitals reviewed. Constitutional: She is oriented to person, place, and time. She appears well-developed and well-nourished.  HENT:  Head: Normocephalic and atraumatic.  Eyes: Conjunctivae and EOM are normal. Right eye exhibits no discharge. Left eye exhibits no discharge.  Neck: Normal range of motion. Neck supple. No thyromegaly present.  Cardiovascular: Normal rate and regular rhythm.   Respiratory: Effort normal and breath sounds normal. No respiratory distress.  GI: Soft. Bowel sounds are normal. She exhibits no distension.  Musculoskeletal:  No edema or tenderness in  extremities  Neurological: She is alert and oriented to person, place, and time.  Motor (pain inhibition): B/l UE 5/5 LLE: 4/5 proximal to distal RLE: 2/5 proximal to distal Sensation diminished to light touch LLE DTRs 3+ LLE, 0 RLE  Skin: Skin is warm and dry.  Back incision is dressed  Psychiatric: She has a normal mood and affect. Her behavior is normal.    Results for orders placed or performed during the hospital encounter of 12/18/16 (from the past 24 hour(s))  CBC     Status: Abnormal   Collection Time: 12/20/16  6:00 AM  Result Value Ref Range   WBC 11.8 (H) 4.0 - 10.5 K/uL   RBC 3.47 (L) 3.87 - 5.11 MIL/uL   Hemoglobin 10.4 (L) 12.0 - 15.0 g/dL   HCT 30.6 (L) 36.0 - 46.0 %   MCV 88.2 78.0 - 100.0 fL   MCH 30.0 26.0 - 34.0 pg   MCHC 34.0 30.0 -  36.0 g/dL   RDW 13.8 11.5 - 15.5 %   Platelets 185 150 - 400 K/uL  Basic Metabolic Panel     Status: Abnormal   Collection Time: 12/20/16  6:00 AM  Result Value Ref Range   Sodium 136 135 - 145 mmol/L   Potassium 4.0 3.5 - 5.1 mmol/L   Chloride 105 101 - 111 mmol/L   CO2 22 22 - 32 mmol/L   Glucose, Bld 194 (H) 65 - 99 mg/dL   BUN 14 6 - 20 mg/dL   Creatinine, Ser 0.84 0.44 - 1.00 mg/dL   Calcium 8.6 (L) 8.9 - 10.3 mg/dL   GFR calc non Af Amer >60 >60 mL/min   GFR calc Af Amer >60 >60 mL/min   Anion gap 9 5 - 15  Glucose, capillary     Status: Abnormal   Collection Time: 12/20/16  7:45 AM  Result Value Ref Range   Glucose-Capillary 158 (H) 65 - 99 mg/dL   Comment 1 Notify RN    Comment 2 Document in Chart   Glucose, capillary     Status: Abnormal   Collection Time: 12/20/16 11:22 AM  Result Value Ref Range   Glucose-Capillary 238 (H) 65 - 99 mg/dL  Glucose, capillary     Status: Abnormal   Collection Time: 12/20/16  4:53 PM  Result Value Ref Range   Glucose-Capillary 207 (H) 65 - 99 mg/dL   Comment 1 Notify RN    Comment 2 Document in Chart   Glucose, capillary     Status: Abnormal   Collection Time: 12/20/16  10:20 PM  Result Value Ref Range   Glucose-Capillary 199 (H) 65 - 99 mg/dL   Dg Thoracic Spine 2 View  Result Date: 12/19/2016 CLINICAL DATA:  T10 transverse ectomy, at T9-10 discectomy, T9-12 PLIF EXAM: THORACIC SPINE 2 VIEWS; DG C-ARM 61-120 MIN COMPARISON:  CT thoracic spine dated 12/18/2016. FLUOROSCOPY TIME:  22 seconds FINDINGS: Single frontal and lateral fluoroscopic radiographs demonstrate T9-12 PLIF surgical hardware in satisfactory position. Additional lumbar spine posterior fixation hardware starting at L2, incompletely visualized. IMPRESSION: Intraoperative fluoroscopic images during T9-12 PLIF, as above. Electronically Signed   By: Julian Hy M.D.   On: 12/19/2016 15:58   Dg C-arm 1-60 Min  Result Date: 12/19/2016 CLINICAL DATA:  T10 transverse ectomy, at T9-10 discectomy, T9-12 PLIF EXAM: THORACIC SPINE 2 VIEWS; DG C-ARM 61-120 MIN COMPARISON:  CT thoracic spine dated 12/18/2016. FLUOROSCOPY TIME:  22 seconds FINDINGS: Single frontal and lateral fluoroscopic radiographs demonstrate T9-12 PLIF surgical hardware in satisfactory position. Additional lumbar spine posterior fixation hardware starting at L2, incompletely visualized. IMPRESSION: Intraoperative fluoroscopic images during T9-12 PLIF, as above. Electronically Signed   By: Julian Hy M.D.   On: 12/19/2016 15:58    Assessment/Plan: Diagnosis: Thoracic myelopathy s/p fusion/fixation Labs and images independently reviewed.  Records reviewed and summated above.     Psych: psychology consult for adjustment to disability for pt and family     Spasticity: may develop spasticity. Manage spasticity only if indicated     (pain, hygiene, prevention of contractures, functional impairment).     Pain Management:  control with oral medications if possible     Bladder:  Monitor for neurogenic bladder. May confirm this with serial PVRs to r/o retention/atonic bladder. In/out clean catherization. Implement bladder program. Encourage  self I&O cath training vs indwelling foley if possible to improve mobility, reduce infection, and     increase safety     Bowel: Implement mechanical  and chemical bowel program and care training with scheduled suppository 30 min to 1 hour after meals to utilize gastrocolic and colorectal reflexes.   1. Does the need for close, 24 hr/day medical supervision in concert with the patient's rehab needs make it unreasonable for this patient to be served in a less intensive setting? Yes  2. Co-Morbidities requiring supervision/potential complications: type 2 diabetes mellitus (Monitor in accordance with exercise and adjust meds as necessary), hyperlipidemia (cont meds), lumbar fusion L4-5 2016 and L2-3 2017, cervical fusion, neuropathic pain (cont meds), post-op pain (Biofeedback training with therapies to help reduce reliance on opiate pain medications, monitor pain control during therapies, and sedation at rest and titrate to maximum efficacy to ensure participation and gains in therapies), Surgery with drain (wean Vanc when appropriate) 3. Due to bladder management, bowel management, safety, skin/wound care, disease management, pain management and patient education, does the patient require 24 hr/day rehab nursing? Yes 4. Does the patient require coordinated care of a physician, rehab nurse, PT (1-2 hrs/day, 5 days/week) and OT (1-2 hrs/day, 5 days/week) to address physical and functional deficits in the context of the above medical diagnosis(es)? Yes Addressing deficits in the following areas: balance, endurance, locomotion, strength, transferring, bowel/bladder control, bathing, dressing, toileting and psychosocial support 5. Can the patient actively participate in an intensive therapy program of at least 3 hrs of therapy per day at least 5 days per week? Yes 6. The potential for patient to make measurable gains while on inpatient rehab is excellent 7. Anticipated functional outcomes upon discharge from  inpatient rehab are min assist  with PT, min assist with OT, n/a with SLP. 8. Estimated rehab length of stay to reach the above functional goals is: 15-20 days. 9. Does the patient have adequate social supports and living environment to accommodate these discharge functional goals? Yes 10. Anticipated D/C setting: Home 11. Anticipated post D/C treatments: HH therapy and Home excercise program 12. Overall Rehab/Functional Prognosis: good  RECOMMENDATIONS: This patient's condition is appropriate for continued rehabilitative care in the following setting: CIR Patient has agreed to participate in recommended program. Yes Note that insurance prior authorization may be required for reimbursement for recommended care.  Comment: Rehab Admissions Coordinator to follow up.  Delice Lesch, MD, Mellody Drown Cathlyn Parsons., PA-C 12/21/2016

## 2016-12-21 NOTE — H&P (Signed)
Physical Medicine and Rehabilitation Admission H&P       Chief Complaint  Patient presents with  . Numbness  : HPI: Megan [name] a 66 y.o.right handed femalewith history of type 2 diabetes mellitus, hyperlipidemia, lumbar fusion L4-5 2016 and L2-3 2017, cervical fusion 2001. Per chart review patient lives with spouse. One level home 1 step to entry. She had been using a scooter for household and community ambulation secondary to bilateral lower extremity weakness for the past few weeks prior to she had used a rolling walker for ambulation. Husband works during the day as well as a local daughter who also works, but husband states he can provide Nurse, mental health.Presented 12/18/2016 with long-standing history of neck and back pain and progressive paraparesis. She denied any bowel or bladder dysfunction. 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 Underwent left T10 costotransversectomy, T10-11 laminectomy for decompression, dorsal internal fixation and fusion with bilateral pedicle screws T9-T12 12/19/2016 per Dr. Cyndy Freeze. Hospital course pain management.Decadron protocol as indicated with taper. TLSO back brace applied in sitting position.Acute blood loss anemia 10.4 and monitored. Physical and occupational therapy evaluations completed with recommendations of physical medicine rehabilitation consult. Patient was admitted for a comprehensive rehabilitation program  Review of Systems  Constitutional: Negative for chills.  HENT: Negative for hearing loss and tinnitus.   Eyes: Negative for blurred vision and double vision.  Respiratory: Negative for cough and shortness of breath.   Cardiovascular: Positive for leg swelling. Negative for chest pain and palpitations.  Gastrointestinal: Positive for constipation. Negative for nausea and vomiting.       Gerd  Genitourinary: Negative for dysuria, flank pain and hematuria.    Musculoskeletal: Positive for back pain and myalgias.  Skin: Negative for rash.  Neurological: Positive for sensory change and weakness. Negative for seizures.  All other systems reviewed and are negative.      Past Medical History:  Diagnosis Date  . Arthritis   . Borderline diabetic   . GERD (gastroesophageal reflux disease)   . History of bronchitis 2005  . History of kidney stones   . History of shingles   . Hyperlipidemia    takes Fish Oil daily  . Neuromuscular disorder (HCC)    tingling toes  . 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[place]) 12/15/2016  . Pneumonia 2009  . PONV (postoperative nausea and vomiting)   . Restless leg   . Type 2 diabetes mellitus ([place])   . Weakness    numbness and tingling in both feet r/t back        Past Surgical History:  Procedure Laterality Date  . ABDOMINAL HYSTERECTOMY  1979  . APPLICATION OF ROBOTIC ASSISTANCE FOR SPINAL PROCEDURE  12/19/2016   Procedure: APPLICATION OF ROBOTIC ASSISTANCE FOR SPINAL PROCEDURE;  Surgeon: Kevan Ny Ditty, MD;  Location: [facility];  Service: Neurosurgery;;  . blADder tacked   1991  . CERVICAL FUSION     2001  . HERNIA REPAIR  1991  . LUMBAR FUSION  2016   L4-5  . LUMBAR FUSION  2017   L2-3  . TONSILLECTOMY AND ADENOIDECTOMY  1959  . TUMOR EXCISION     WERTHIN'S TUMORS BOTH SIDES OF NECK         Family History  Problem Relation Age of Onset  . Diabetes Mother   . Dementia Mother   . Cirrhosis Mother     Non alcoholic  . COPD Father   . Diabetes Brother  Social History:  reports that she quit smoking about 10 years ago. Her smoking use included Cigarettes. She smoked 1.50 packs per day. She has never used smokeless tobacco. She reports that she drinks alcohol. She reports that she does not use drugs. Allergies:       Allergies  Allergen Reactions  . Shellfish Allergy Anaphylaxis, Swelling and Other (See Comments)    Tongue swells  . Oxycodone Nausea  Only  . Penicillins Rash    Has patient had a PCN reaction causing immediate rash, facial/tongue/throat swelling, SOB or lightheadedness with hypotension: Yes Has patient had a PCN reaction causing severe rash involving mucus membranes or skin necrosis: No Has patient had a PCN reaction that required hospitalization No Has patient had a PCN reaction occurring within the last 10 years: No If all of the above answers are "NO", then may proceed with Cephalosporin use.         Medications Prior to Admission  Medication Sig Dispense Refill  . atorvastatin (LIPITOR) 20 MG tablet Take 1 tablet (20 mg total) by mouth every evening. (Patient taking differently: Take 20 mg by mouth daily after lunch. ) 90 tablet 3  . cholecalciferol (VITAMIN D) 1000 units tablet Take 1,000 Units by mouth daily after lunch.     . Coenzyme Q10 (COQ10 PO) Take 1 capsule by mouth daily after supper.    . Cranberry 500 MG TABS Take 500 mg by mouth daily after supper.    . dexamethasone (DECADRON) 2 MG tablet Take 8 mg by mouth daily after lunch. 30 day course started 12/18/16  0  . gabapentin (NEURONTIN) 300 MG capsule Take 300 mg by mouth 2 (two) times daily.     Marland Kitchen ibuprofen (ADVIL,MOTRIN) 200 MG tablet Take 400 mg by mouth every 6 (six) hours as needed for headache.     . lisinopril (PRINIVIL,ZESTRIL) 10 MG tablet Take 1 tablet (10 mg total) by mouth daily. (Patient taking differently: Take 10 mg by mouth daily after lunch. ) 30 tablet 0  . metFORMIN (GLUCOPHAGE) 500 MG tablet Take 1 tablet by mouth once daily with food for 2 weeks, then advance to 1 tablet twice daily with food. (Patient taking differently: Take 500 mg by mouth 2 (two) times daily with a meal. ) 180 tablet 0  . Multiple Vitamin (MULTIVITAMIN WITH MINERALS) TABS tablet Take 1 tablet by mouth daily after lunch.     . oxybutynin (OXYTROL) 3.9 MG/24HR Place 1 patch onto the skin See admin instructions. Apply 1 patch (3.9 mg) transdermally once  every 5 days, leave on for 5 days and then remove and replace    . traMADol (ULTRAM) 50 MG tablet Take 50 mg by mouth 2 (two) times daily as needed (pain).       Home: Home Living Family/patient expects to be discharged to:: Private residence Living Arrangements: Spouse/significant other Available Help at Discharge: Family, Available 24 hours/day Type of Home: House Home Access: Stairs to enter [employer] of Steps: 1 Entrance Stairs-Rails: None Home Layout: One level Bathroom Shower/Tub: Tub/shower unit, Door, Architectural technologist: Standard Bathroom Accessibility: Yes Home Equipment: Civil engineer, contracting, Environmental consultant - 2 wheels, Cane - single point, Environmental consultant - 4 wheels, Grab bars - tub/shower Additional Comments: Pt has been using scooter at home and it does fit through bathroom door    Functional History: Prior Function Level of Independence: Needs assistance Gait / Transfers Assistance Needed: Pt has had progressive weakness over past few months - for past 3 weeks,  has required scooter for household and community amb secondary to BLE weakness; requires intermittent assist from husband to transfer to/from. Prior to this, she required RW for amb.  ADL's / Homemaking Assistance Needed: Husband performs cooking/cleaning; provides intermittent assist for dressing, bathing. Pt indep with pericare. Comments: Pt reports progressive decline in function due to weakenss over the past 3+ weeks.  Prior to that, pt was mod I and ambulatory   Functional Status:  Mobility: Bed Mobility Overal bed mobility: Needs Assistance Bed Mobility: Rolling, Sidelying to Sit, Sit to Sidelying Rolling: Min assist Sidelying to sit: Max assist Sit to sidelying: Max assist General bed mobility comments: Pt requires cues for log rolling.  Assist to flex bil. knees and assist to lift trunk  Transfers Overall transfer level: Needs assistance Equipment used: Rolling walker (2 wheeled) Transfers: Sit  to/from Stand Sit to Stand: Mod assist, +2 physical assistance General transfer comment: Pt stood x 3 with mod A +2, however, Rt knee flexed spontaneously resulting in no contact of Rt LE with the ground.  With input through Rt knee, she was unable to assist or extend Rt LE.  She was unable to achieve full extension of Lt LE with Lt LE sliding out from under her  Ambulation/Gait Ambulation/Gait assistance: +2 physical assistance, Mod assist Ambulation Distance (Feet): 2 Feet Assistive device: Rolling walker (2 wheeled) Gait Pattern/deviations: Step-to pattern General Gait Details: Amb a few steps to chair with RW and modA +2 for trunk support and physical assist to move R foot; pt with increased difficulty clearing RLE to bring it forward, better able to slide RLE backwards.   ADL: ADL Overall ADL's : Needs assistance/impaired Eating/Feeding: Independent Grooming: Wash/dry hands, Wash/dry face, Oral care, Brushing hair, Set up, Bed level Upper Body Bathing: Moderate assistance, Bed level, Standing Lower Body Bathing: Maximal assistance, Bed level Upper Body Dressing : Maximal assistance, Sitting Lower Body Dressing: Total assistance, Bed level Toilet Transfer: Maximal assistance, +2 for physical assistance, Stand-pivot, BSC Toileting- Clothing Manipulation and Hygiene: Total assistance Functional mobility during ADLs: Moderate assistance, Maximal assistance, +2 for physical assistance  Cognition: Cognition Overall Cognitive Status: Within Functional Limits for tasks assessed Orientation Level: Oriented X4 Cognition Arousal/Alertness: Awake/alert Behavior During Therapy: WFL for tasks assessed/performed Overall Cognitive Status: Within Functional Limits for tasks assessed  Physical Exam: Blood pressure (!) 154/92, pulse 76, temperature 98.2 F (36.8 C), temperature source Oral, resp. rate 15, height 5' 8"  (1.727 m), weight 74.8 kg (164 lb 12.8 oz), SpO2 98 %. Physical Exam    Constitutional: She appears well-developed.  HENT:  Head: Normocephalic.  Eyes: EOM are normal. Left eye exhibits no discharge.  Neck: Normal range of motion. Neck supple. No thyromegaly present.  Cardiovascular: Normal rate and regular rhythm.   Respiratory: Effort normal and breath sounds normal. No respiratory distress.  GI: Soft. Bowel sounds are normal. She exhibits no distension.  Skin.warm and dry with incision dressed /drain in place Neurological: She is alertand oriented to person, place, and time.  Motor (pain inhibition): B/l UE 5/5 LLE: 4/5 proximal to distal RLE: 2/5 proximal to distal Sensation diminished to light touch LLE DTRs 3+ LLE, 0 RLE   Lab Results Last 48 Hours        Results for orders placed or performed during the hospital encounter of 12/18/16 (from the past 48 hour(s))  Glucose, capillary     Status: Abnormal   Collection Time: 12/19/16  3:59 PM  Result Value Ref Range   Glucose-Capillary 218 (  H) 65 - 99 mg/dL  Glucose, capillary     Status: Abnormal   Collection Time: 12/19/16 11:24 PM  Result Value Ref Range   Glucose-Capillary 221 (H) 65 - 99 mg/dL  CBC     Status: Abnormal   Collection Time: 12/20/16  6:00 AM  Result Value Ref Range   WBC 11.8 (H) 4.0 - 10.5 K/uL   RBC 3.47 (L) 3.87 - 5.11 MIL/uL   Hemoglobin 10.4 (L) 12.0 - 15.0 g/dL   HCT 30.6 (L) 36.0 - 46.0 %   MCV 88.2 78.0 - 100.0 fL   MCH 30.0 26.0 - 34.0 pg   MCHC 34.0 30.0 - 36.0 g/dL   RDW 13.8 11.5 - 15.5 %   Platelets 185 150 - 400 K/uL  Basic Metabolic Panel     Status: Abnormal   Collection Time: 12/20/16  6:00 AM  Result Value Ref Range   Sodium 136 135 - 145 mmol/L   Potassium 4.0 3.5 - 5.1 mmol/L   Chloride 105 101 - 111 mmol/L   CO2 22 22 - 32 mmol/L   Glucose, Bld 194 (H) 65 - 99 mg/dL   BUN 14 6 - 20 mg/dL   Creatinine, Ser 0.84 0.44 - 1.00 mg/dL   Calcium 8.6 (L) 8.9 - 10.3 mg/dL   GFR calc non Af Amer >60 >60 mL/min   GFR calc Af  Amer >60 >60 mL/min    Comment: (NOTE) The eGFR has been calculated using the CKD EPI equation. This calculation has not been validated in all clinical situations. eGFR's persistently <60 mL/min signify possible Chronic Kidney Disease.    Anion gap 9 5 - 15  Glucose, capillary     Status: Abnormal   Collection Time: 12/20/16  7:45 AM  Result Value Ref Range   Glucose-Capillary 158 (H) 65 - 99 mg/dL   Comment 1 Notify RN    Comment 2 Document in Chart   Glucose, capillary     Status: Abnormal   Collection Time: 12/20/16 11:22 AM  Result Value Ref Range   Glucose-Capillary 238 (H) 65 - 99 mg/dL  Glucose, capillary     Status: Abnormal   Collection Time: 12/20/16  4:53 PM  Result Value Ref Range   Glucose-Capillary 207 (H) 65 - 99 mg/dL   Comment 1 Notify RN    Comment 2 Document in Chart   Glucose, capillary     Status: Abnormal   Collection Time: 12/20/16 10:20 PM  Result Value Ref Range   Glucose-Capillary 199 (H) 65 - 99 mg/dL  Glucose, capillary     Status: Abnormal   Collection Time: 12/21/16  7:31 AM  Result Value Ref Range   Glucose-Capillary 186 (H) 65 - 99 mg/dL   Comment 1 Notify RN    Comment 2 Document in Chart       Imaging Results (Last 48 hours)  Dg Thoracic Spine 2 View  Result Date: 12/19/2016 CLINICAL DATA:  T10 transverse ectomy, at T9-10 discectomy, T9-12 PLIF EXAM: THORACIC SPINE 2 VIEWS; DG C-ARM 61-120 MIN COMPARISON:  CT thoracic spine dated 12/18/2016. FLUOROSCOPY TIME:  22 seconds FINDINGS: Single frontal and lateral fluoroscopic radiographs demonstrate T9-12 PLIF surgical hardware in satisfactory position. Additional lumbar spine posterior fixation hardware starting at L2, incompletely visualized. IMPRESSION: Intraoperative fluoroscopic images during T9-12 PLIF, as above. Electronically Signed   By: Julian Hy M.D.   On: 12/19/2016 15:58   Dg C-arm 1-60 Min  Result Date: 12/19/2016 CLINICAL DATA:  T10 transverse  ectomy, at T9-10 discectomy, T9-12 PLIF EXAM: THORACIC SPINE 2 VIEWS; DG C-ARM 61-120 MIN COMPARISON:  CT thoracic spine dated 12/18/2016. FLUOROSCOPY TIME:  22 seconds FINDINGS: Single frontal and lateral fluoroscopic radiographs demonstrate T9-12 PLIF surgical hardware in satisfactory position. Additional lumbar spine posterior fixation hardware starting at L2, incompletely visualized. IMPRESSION: Intraoperative fluoroscopic images during T9-12 PLIF, as above. Electronically Signed   By: Julian Hy M.D.   On: 12/19/2016 15:58        Medical Problem List and Plan: 1. 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 Back brace when out of bed 2.  DVT Prophylaxis/Anticoagulation: SCDs. Check vascular study 3. Pain Management: OxyContin sustained release 20 mg every 12 hours Celebrex 200 mg every every 12 hours, Neurontin 300 mg 3 times a day, Robaxin 750 mg 4 times a day, Hydrocodone as needed 4. Mood: Provide emotional support 5. Neuropsych: This patient is capable of making decisions on her own behalf. 6. Skin/Wound Care: Routine skin checks 7. Fluids/Electrolytes/Nutrition: Routine I&O with follow-up chemistries 8. Diabetes mellitus. Glucophage 500 mg twice a day. Check blood sugars before meals and at bedtime. Monitor closely while on Decadron taper 9. Hypertension. Lisinopril 10 mg daily. 10. Hyperlipidemia. Lipitor 11. Overactive bladder. Oxytrol patch. Check PVR 3 12. Constipation. Laxative assistance 13. GERD. Protonix  Post Admission Physician Evaluation: 1. Functional deficits secondary  to thoracic myelopathy. 2. Patient is admitted to receive collaborative, interdisciplinary care between the physiatrist, rehab nursing staff, and therapy team. 3. Patient's level of medical complexity and substantial therapy needs in context of that medical  necessity cannot be provided at a lesser intensity of care such as a SNF. 4. Patient has experienced substantial functional loss from his/her baseline which was documented above under the "Functional History" and "Functional Status" headings.  Judging by the patient's diagnosis, physical exam, and functional history, the patient has potential for functional progress which will result in measurable gains while on inpatient rehab.  These gains will be of substantial and practical use upon discharge  in facilitating mobility and self-care at the household level. 5. Physiatrist will provide 24 hour management of medical needs as well as oversight of the therapy plan/treatment and provide guidance as appropriate regarding the interaction of the two. 6. The Preadmission Screening has been reviewed and patient status is unchanged unless otherwise stated above. 7. 24 hour rehab nursing will assist with bladder management, bowel management, safety, skin/wound care, disease management, medication administration, pain management and patient education  and help integrate therapy concepts, techniques,education, etc. 8. PT will assess and treat for/with: Lower extremity strength, range of motion, stamina, balance, functional mobility, safety, adaptive techniques and equipment.   Goals are: supervision to min assist. 9. OT will assess and treat for/with: ADL's, functional mobility, safety, upper extremity strength, adaptive techniques and equipment, NMR.   Goals are: supervision to min assist. Therapy may not yet proceed with showering this patient. 10. SLP will assess and treat for/with: n/a.  Goals are: n/a. 11. Case Management and Social Worker will assess and treat for psychological issues and discharge planning. 12. Team conference will be held weekly to assess progress toward goals and to determine barriers to discharge. 13. Patient will receive at least 3 hours of therapy per day at least 5 days per  week. 14. ELOS: 15-20 days       15. Prognosis:  good     Meredith Staggers, MD, [facility] Medicine [facility] Rehabilitation 12/21/2016  Cathlyn Parsons., PA-C 12/21/2016

## 2016-12-21 NOTE — H&P (Signed)
Physical Medicine and Rehabilitation Admission H&P    Chief Complaint  Patient presents with  . Numbness  : HPI:  Megan [name] is a 66 y.o. right handed female with history of type 2 diabetes mellitus, hyperlipidemia, lumbar fusion L4-5 2016 and L2-3 2017, cervical fusion 2001. Per chart review patient lives with spouse. One level home 1 step to entry. She had been using a scooter for household and community ambulation secondary to bilateral lower extremity weakness for the past few weeks prior to she had used a rolling walker for ambulation. Husband works during the day as well as a local daughter who also works, but husband states he can provide Nurse, mental health. Presented 12/18/2016 with long-standing history of neck and back pain and progressive paraparesis. She denied any bowel or bladder dysfunction. 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 Underwent left T10 costotransversectomy, T10-11 laminectomy for decompression, dorsal internal fixation and fusion with bilateral pedicle screws T9-T12 12/19/2016 per Dr. Cyndy Freeze. Hospital course pain management.Decadron protocol as indicated with taper. TLSO back brace applied in sitting position.Acute blood loss anemia 10.4 and monitored. Physical and occupational therapy evaluations completed with recommendations of physical medicine rehabilitation consult. Patient was admitted for a comprehensive rehabilitation program  Review of Systems  Constitutional: Negative for chills.  HENT: Negative for hearing loss and tinnitus.   Eyes: Negative for blurred vision and double vision.  Respiratory: Negative for cough and shortness of breath.   Cardiovascular: Positive for leg swelling. Negative for chest pain and palpitations.  Gastrointestinal: Positive for constipation. Negative for nausea and vomiting.       Gerd  Genitourinary: Negative for dysuria, flank pain and hematuria.    Musculoskeletal: Positive for back pain and myalgias.  Skin: Negative for rash.  Neurological: Positive for sensory change and weakness. Negative for seizures.  All other systems reviewed and are negative.  Past Medical History:  Diagnosis Date  . Arthritis   . Borderline diabetic   . GERD (gastroesophageal reflux disease)   . History of bronchitis 2005  . History of kidney stones   . History of shingles   . Hyperlipidemia    takes Fish Oil daily  . Neuromuscular disorder (HCC)    tingling toes  . 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[place]) 12/15/2016  . Pneumonia 2009  . PONV (postoperative nausea and vomiting)   . Restless leg   . Type 2 diabetes mellitus ([place])   . Weakness    numbness and tingling in both feet r/t back   Past Surgical History:  Procedure Laterality Date  . ABDOMINAL HYSTERECTOMY  1979  . APPLICATION OF ROBOTIC ASSISTANCE FOR SPINAL PROCEDURE  12/19/2016   Procedure: APPLICATION OF ROBOTIC ASSISTANCE FOR SPINAL PROCEDURE;  Surgeon: Kevan Ny Ditty, MD;  Location: [facility];  Service: Neurosurgery;;  . blADder tacked   1991  . CERVICAL FUSION     2001  . HERNIA REPAIR  1991  . LUMBAR FUSION  2016   L4-5  . LUMBAR FUSION  2017   L2-3  . TONSILLECTOMY AND ADENOIDECTOMY  1959  . TUMOR EXCISION     WERTHIN'S TUMORS BOTH SIDES OF NECK   Family History  Problem Relation Age of Onset  . Diabetes Mother   . Dementia Mother   . Cirrhosis Mother     Non alcoholic  . COPD Father   . Diabetes Brother    Social History:  reports that she quit smoking about 10 years  ago. Her smoking use included Cigarettes. She smoked 1.50 packs per day. She has never used smokeless tobacco. She reports that she drinks alcohol. She reports that she does not use drugs. Allergies:  Allergies  Allergen Reactions  . Shellfish Allergy Anaphylaxis, Swelling and Other (See Comments)    Tongue swells  . Oxycodone Nausea Only  . Penicillins Rash    Has patient had a PCN reaction causing  immediate rash, facial/tongue/throat swelling, SOB or lightheadedness with hypotension: Yes Has patient had a PCN reaction causing severe rash involving mucus membranes or skin necrosis: No Has patient had a PCN reaction that required hospitalization No Has patient had a PCN reaction occurring within the last 10 years: No If all of the above answers are "NO", then may proceed with Cephalosporin use.   Medications Prior to Admission  Medication Sig Dispense Refill  . atorvastatin (LIPITOR) 20 MG tablet Take 1 tablet (20 mg total) by mouth every evening. (Patient taking differently: Take 20 mg by mouth daily after lunch. ) 90 tablet 3  . cholecalciferol (VITAMIN D) 1000 units tablet Take 1,000 Units by mouth daily after lunch.     . Coenzyme Q10 (COQ10 PO) Take 1 capsule by mouth daily after supper.    . Cranberry 500 MG TABS Take 500 mg by mouth daily after supper.    . dexamethasone (DECADRON) 2 MG tablet Take 8 mg by mouth daily after lunch. 30 day course started 12/18/16  0  . gabapentin (NEURONTIN) 300 MG capsule Take 300 mg by mouth 2 (two) times daily.     Marland Kitchen ibuprofen (ADVIL,MOTRIN) 200 MG tablet Take 400 mg by mouth every 6 (six) hours as needed for headache.     . lisinopril (PRINIVIL,ZESTRIL) 10 MG tablet Take 1 tablet (10 mg total) by mouth daily. (Patient taking differently: Take 10 mg by mouth daily after lunch. ) 30 tablet 0  . metFORMIN (GLUCOPHAGE) 500 MG tablet Take 1 tablet by mouth once daily with food for 2 weeks, then advance to 1 tablet twice daily with food. (Patient taking differently: Take 500 mg by mouth 2 (two) times daily with a meal. ) 180 tablet 0  . Multiple Vitamin (MULTIVITAMIN WITH MINERALS) TABS tablet Take 1 tablet by mouth daily after lunch.     . oxybutynin (OXYTROL) 3.9 MG/24HR Place 1 patch onto the skin See admin instructions. Apply 1 patch (3.9 mg) transdermally once every 5 days, leave on for 5 days and then remove and replace    . traMADol (ULTRAM) 50 MG  tablet Take 50 mg by mouth 2 (two) times daily as needed (pain).       Home: Home Living Family/patient expects to be discharged to:: Private residence Living Arrangements: Spouse/significant other Available Help at Discharge: Family, Available 24 hours/day Type of Home: House Home Access: Stairs to enter [employer] of Steps: 1 Entrance Stairs-Rails: None Home Layout: One level Bathroom Shower/Tub: Tub/shower unit, Door, Architectural technologist: Standard Bathroom Accessibility: Yes Home Equipment: Civil engineer, contracting, Environmental consultant - 2 wheels, Cane - single point, Environmental consultant - 4 wheels, Grab bars - tub/shower Additional Comments: Pt has been using scooter at home and it does fit through bathroom door    Functional History: Prior Function Level of Independence: Needs assistance Gait / Transfers Assistance Needed: Pt has had progressive weakness over past few months - for past 3 weeks, has required scooter for household and community amb secondary to BLE weakness; requires intermittent assist from husband to transfer to/from. Prior to  this, she required RW for amb.  ADL's / Homemaking Assistance Needed: Husband performs cooking/cleaning; provides intermittent assist for dressing, bathing. Pt indep with pericare. Comments: Pt reports progressive decline in function due to weakenss over the past 3+ weeks.  Prior to that, pt was mod I and ambulatory   Functional Status:  Mobility: Bed Mobility Overal bed mobility: Needs Assistance Bed Mobility: Rolling, Sidelying to Sit, Sit to Sidelying Rolling: Min assist Sidelying to sit: Max assist Sit to sidelying: Max assist General bed mobility comments: Pt requires cues for log rolling.  Assist to flex bil. knees and assist to lift trunk  Transfers Overall transfer level: Needs assistance Equipment used: Rolling walker (2 wheeled) Transfers: Sit to/from Stand Sit to Stand: Mod assist, +2 physical assistance General transfer comment: Pt stood x 3  with mod A +2, however, Rt knee flexed spontaneously resulting in no contact of Rt LE with the ground.  With input through Rt knee, she was unable to assist or extend Rt LE.  She was unable to achieve full extension of Lt LE with Lt LE sliding out from under her  Ambulation/Gait Ambulation/Gait assistance: +2 physical assistance, Mod assist Ambulation Distance (Feet): 2 Feet Assistive device: Rolling walker (2 wheeled) Gait Pattern/deviations: Step-to pattern General Gait Details: Amb a few steps to chair with RW and modA +2 for trunk support and physical assist to move R foot; pt with increased difficulty clearing RLE to bring it forward, better able to slide RLE backwards.     ADL: ADL Overall ADL's : Needs assistance/impaired Eating/Feeding: Independent Grooming: Wash/dry hands, Wash/dry face, Oral care, Brushing hair, Set up, Bed level Upper Body Bathing: Moderate assistance, Bed level, Standing Lower Body Bathing: Maximal assistance, Bed level Upper Body Dressing : Maximal assistance, Sitting Lower Body Dressing: Total assistance, Bed level Toilet Transfer: Maximal assistance, +2 for physical assistance, Stand-pivot, BSC Toileting- Clothing Manipulation and Hygiene: Total assistance Functional mobility during ADLs: Moderate assistance, Maximal assistance, +2 for physical assistance  Cognition: Cognition Overall Cognitive Status: Within Functional Limits for tasks assessed Orientation Level: Oriented X4 Cognition Arousal/Alertness: Awake/alert Behavior During Therapy: WFL for tasks assessed/performed Overall Cognitive Status: Within Functional Limits for tasks assessed  Physical Exam: Blood pressure (!) 154/92, pulse 76, temperature 98.2 F (36.8 C), temperature source Oral, resp. rate 15, height 5' 8"  (1.727 m), weight 74.8 kg (164 lb 12.8 oz), SpO2 98 %. Physical Exam  Constitutional: She appears well-developed.  HENT:  Head: Normocephalic.  Eyes: EOM are normal. Left eye  exhibits no discharge.  Neck: Normal range of motion. Neck supple. No thyromegaly present.  Cardiovascular: Normal rate and regular rhythm.   Respiratory: Effort normal and breath sounds normal. No respiratory distress.  GI: Soft. Bowel sounds are normal. She exhibits no distension.  Skin.warm and dry with incision dressed /drain in place Neurological: She is alert and oriented to person, place, and time.  Motor (pain inhibition): B/l UE 5/5 LLE: 4/5 proximal to distal RLE: 2/5 proximal to distal Sensation diminished to light touch LLE DTRs 3+ LLE, 0 RLE    Results for orders placed or performed during the hospital encounter of 12/18/16 (from the past 48 hour(s))  Glucose, capillary     Status: Abnormal   Collection Time: 12/19/16  3:59 PM  Result Value Ref Range   Glucose-Capillary 218 (H) 65 - 99 mg/dL  Glucose, capillary     Status: Abnormal   Collection Time: 12/19/16 11:24 PM  Result Value Ref Range   Glucose-Capillary 221 (H)  65 - 99 mg/dL  CBC     Status: Abnormal   Collection Time: 12/20/16  6:00 AM  Result Value Ref Range   WBC 11.8 (H) 4.0 - 10.5 K/uL   RBC 3.47 (L) 3.87 - 5.11 MIL/uL   Hemoglobin 10.4 (L) 12.0 - 15.0 g/dL   HCT 30.6 (L) 36.0 - 46.0 %   MCV 88.2 78.0 - 100.0 fL   MCH 30.0 26.0 - 34.0 pg   MCHC 34.0 30.0 - 36.0 g/dL   RDW 13.8 11.5 - 15.5 %   Platelets 185 150 - 400 K/uL  Basic Metabolic Panel     Status: Abnormal   Collection Time: 12/20/16  6:00 AM  Result Value Ref Range   Sodium 136 135 - 145 mmol/L   Potassium 4.0 3.5 - 5.1 mmol/L   Chloride 105 101 - 111 mmol/L   CO2 22 22 - 32 mmol/L   Glucose, Bld 194 (H) 65 - 99 mg/dL   BUN 14 6 - 20 mg/dL   Creatinine, Ser 0.84 0.44 - 1.00 mg/dL   Calcium 8.6 (L) 8.9 - 10.3 mg/dL   GFR calc non Af Amer >60 >60 mL/min   GFR calc Af Amer >60 >60 mL/min    Comment: (NOTE) The eGFR has been calculated using the CKD EPI equation. This calculation has not been validated in all clinical  situations. eGFR's persistently <60 mL/min signify possible Chronic Kidney Disease.    Anion gap 9 5 - 15  Glucose, capillary     Status: Abnormal   Collection Time: 12/20/16  7:45 AM  Result Value Ref Range   Glucose-Capillary 158 (H) 65 - 99 mg/dL   Comment 1 Notify RN    Comment 2 Document in Chart   Glucose, capillary     Status: Abnormal   Collection Time: 12/20/16 11:22 AM  Result Value Ref Range   Glucose-Capillary 238 (H) 65 - 99 mg/dL  Glucose, capillary     Status: Abnormal   Collection Time: 12/20/16  4:53 PM  Result Value Ref Range   Glucose-Capillary 207 (H) 65 - 99 mg/dL   Comment 1 Notify RN    Comment 2 Document in Chart   Glucose, capillary     Status: Abnormal   Collection Time: 12/20/16 10:20 PM  Result Value Ref Range   Glucose-Capillary 199 (H) 65 - 99 mg/dL  Glucose, capillary     Status: Abnormal   Collection Time: 12/21/16  7:31 AM  Result Value Ref Range   Glucose-Capillary 186 (H) 65 - 99 mg/dL   Comment 1 Notify RN    Comment 2 Document in Chart    Dg Thoracic Spine 2 View  Result Date: 12/19/2016 CLINICAL DATA:  T10 transverse ectomy, at T9-10 discectomy, T9-12 PLIF EXAM: THORACIC SPINE 2 VIEWS; DG C-ARM 61-120 MIN COMPARISON:  CT thoracic spine dated 12/18/2016. FLUOROSCOPY TIME:  22 seconds FINDINGS: Single frontal and lateral fluoroscopic radiographs demonstrate T9-12 PLIF surgical hardware in satisfactory position. Additional lumbar spine posterior fixation hardware starting at L2, incompletely visualized. IMPRESSION: Intraoperative fluoroscopic images during T9-12 PLIF, as above. Electronically Signed   By: Julian Hy M.D.   On: 12/19/2016 15:58   Dg C-arm 1-60 Min  Result Date: 12/19/2016 CLINICAL DATA:  T10 transverse ectomy, at T9-10 discectomy, T9-12 PLIF EXAM: THORACIC SPINE 2 VIEWS; DG C-ARM 61-120 MIN COMPARISON:  CT thoracic spine dated 12/18/2016. FLUOROSCOPY TIME:  22 seconds FINDINGS: Single frontal and lateral fluoroscopic  radiographs demonstrate T9-12 PLIF surgical hardware  in satisfactory position. Additional lumbar spine posterior fixation hardware starting at L2, incompletely visualized. IMPRESSION: Intraoperative fluoroscopic images during T9-12 PLIF, as above. Electronically Signed   By: Julian Hy M.D.   On: 12/19/2016 15:58       Medical Problem List and Plan: 1. 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 Back brace when out of bed 2.  DVT Prophylaxis/Anticoagulation: SCDs. Check vascular study 3. Pain Management: OxyContin sustained release 20 mg every 12 hours Celebrex 200 mg every every 12 hours, Neurontin 300 mg 3 times a day, Robaxin 750 mg 4 times a day, Hydrocodone as needed 4. Mood: Provide emotional support 5. Neuropsych: This patient is capable of making decisions on her own behalf. 6. Skin/Wound Care: Routine skin checks 7. Fluids/Electrolytes/Nutrition: Routine I&O with follow-up chemistries 8. Diabetes mellitus. Glucophage 500 mg twice a day. Check blood sugars before meals and at bedtime. Monitor closely while on Decadron taper 9. Hypertension. Lisinopril 10 mg daily. 10. Hyperlipidemia. Lipitor 11. Overactive bladder. Oxytrol patch. Check PVR 3 12. Constipation. Laxative assistance 13. GERD. Protonix  Post Admission Physician Evaluation: 1. Functional deficits secondary  to thoracic myelopathy. 2. Patient is admitted to receive collaborative, interdisciplinary care between the physiatrist, rehab nursing staff, and therapy team. 3. Patient's level of medical complexity and substantial therapy needs in context of that medical necessity cannot be provided at a lesser intensity of care such as a SNF. 4. Patient has experienced substantial functional loss from his/her baseline which was documented above under the "Functional History" and "Functional  Status" headings.  Judging by the patient's diagnosis, physical exam, and functional history, the patient has potential for functional progress which will result in measurable gains while on inpatient rehab.  These gains will be of substantial and practical use upon discharge  in facilitating mobility and self-care at the household level. 5. Physiatrist will provide 24 hour management of medical needs as well as oversight of the therapy plan/treatment and provide guidance as appropriate regarding the interaction of the two. 6. The Preadmission Screening has been reviewed and patient status is unchanged unless otherwise stated above. 7. 24 hour rehab nursing will assist with bladder management, bowel management, safety, skin/wound care, disease management, medication administration, pain management and patient education  and help integrate therapy concepts, techniques,education, etc. 8. PT will assess and treat for/with: Lower extremity strength, range of motion, stamina, balance, functional mobility, safety, adaptive techniques and equipment.   Goals are: supervision to min assist. 9. OT will assess and treat for/with: ADL's, functional mobility, safety, upper extremity strength, adaptive techniques and equipment, NMR.   Goals are: supervision to min assist. Therapy may not yet proceed with showering this patient. 10. SLP will assess and treat for/with: n/a.  Goals are: n/a. 11. Case Management and Social Worker will assess and treat for psychological issues and discharge planning. 12. Team conference will be held weekly to assess progress toward goals and to determine barriers to discharge. 13. Patient will receive at least 3 hours of therapy per day at least 5 days per week. 14. ELOS: 15-20 days       15. Prognosis:  good     Meredith Staggers, MD, [facility] 12/21/2016  Cathlyn Parsons., PA-C 12/21/2016

## 2016-12-21 NOTE — Progress Notes (Signed)
Megan Gong, RN Rehab Admission Coordinator Signed Physical Medicine and Rehabilitation  PMR Pre-admission Date of Service: 12/21/2016 2:03 PM  Related encounter: ED to Hosp-Admission (Current) from 12/18/2016 in [place]       '[]'$ Hide copied text PMR Admission Coordinator Pre-Admission Assessment  Patient: Megan [name] is an 66 y.o., female MRN: [id number] DOB: 03-Sep-1951 Height: '5\' 8"'$  (172.7 cm) Weight: 74.8 kg (164 lb 12.8 oz)                                                                                                                                                  Insurance Information HMO:    PPO: yes     PCP:      IPA:      80/20:      OTHER:  PRIMARY: [employer] [place]      Policy#: [id number]      Subscriber: pt CM Name: Flossie Buffy    Phone#: [phone]     Fax#: [phone] Pre-Cert#: [id number]      Employer: approved until 4/23 when updates are due Benefits:  Phone #: [phone]     Name: 12/21/16 Eff. Date: 09/13/16     Deduct: $7000      Out of Pocket Max: $7350 include deductible      Life Max: n/a CIR: 70%      SNF: 70% 60 days Outpatient: $75 co pay per visit     Co-Pay: 30 visits combined Home Health: 70%      Co-Pay: 30% DME: 70%     Co-Pay: 30% Providers: in network  SECONDARY: none       Medicaid Application Date:       Case Manager:  Disability Application Date:       Case Worker:   Emergency Tax adviser Information    Name Relation Home Work Mobile   [place] Spouse [phone]  [phone]     Current Medical History  Patient Admitting Diagnosis: thoracic myelopathy s/p fusion/fixation  History of Present Illness:HPI: Megan [name] a 66 y.o.right handed femalewith history of type 2 diabetes mellitus, hyperlipidemia, lumbar fusion L4-5 2016 and L2-3 2017, cervical fusion 2001. Presented 12/18/2016 with long-standing history of neck and back pain and  progressive paraparesis. She denied any bowel or bladder dysfunction. 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 Underwent left T10 costotransversectomy, T10-11 laminectomy for decompression, dorsal internal fixation and fusion with bilateral pedicle screws T9-T12 12/19/2016 per Dr. Cyndy Freeze. Hospital course pain management.Decadron protocol as indicated with taper.TLSO back brace applied in sitting position.Acute blood loss anemia 10.4 and monitored.  Past Medical History      Past Medical History:  Diagnosis Date  . Arthritis   . Borderline diabetic   . GERD (  gastroesophageal reflux disease)   . History of bronchitis 2005  . History of kidney stones   . History of shingles   . Hyperlipidemia    takes Fish Oil daily  . Neuromuscular disorder (HCC)    tingling toes  . 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[facility]) 12/15/2016  . Pneumonia 2009  . PONV (postoperative nausea and vomiting)   . Restless leg   . Type 2 diabetes mellitus ([place])   . Weakness    numbness and tingling in both feet r/t back    Family History  family history includes COPD in her father; Cirrhosis in her mother; Dementia in her mother; Diabetes in her brother and mother.  Prior Rehab/Hospitalizations:  Has the patient had major surgery during 100 days prior to admission? No  Current Medications   Current Facility-Administered Medications:  .  0.9 %  sodium chloride infusion, , Intravenous, Continuous, Kevan Ny Ditty, MD, Last Rate: 100 mL/hr at 12/21/16 0900 .  acetaminophen (TYLENOL) tablet 650 mg, 650 mg, Oral, Q6H PRN **OR** acetaminophen (TYLENOL) suppository 650 mg, 650 mg, Rectal, Q6H PRN, Vista Mink Costella, PA-C .  acetaminophen (TYLENOL) tablet 1,000 mg, 1,000 mg, Oral, Q6H, Kevan Ny Ditty, MD, 1,000 mg at 12/21/16 1448 .  atorvastatin (LIPITOR) tablet 20 mg, 20 mg, Oral, QPM, Vincent J Costella, PA-C, 20 mg at  12/20/16 1739 .  bisacodyl (DULCOLAX) EC tablet 5 mg, 5 mg, Oral, Daily PRN, Kevan Ny Ditty, MD .  celecoxib (CELEBREX) capsule 200 mg, 200 mg, Oral, Q12H, Kevan Ny Ditty, MD, 200 mg at 12/21/16 0903 .  cholecalciferol (VITAMIN D) tablet 2,000 Units, 2,000 Units, Oral, Daily, [employer], PA-C, 2,000 Units at 12/21/16 1308 .  dexamethasone (DECADRON) injection 6 mg, 6 mg, Intravenous, Q6H, Vincent J Costella, PA-C, 6 mg at 12/21/16 1448 .  diazepam (VALIUM) tablet 5 mg, 5 mg, Oral, Q6H PRN, Kevan Ny Ditty, MD, 5 mg at 12/21/16 0942 .  docusate sodium (COLACE) capsule 100 mg, 100 mg, Oral, BID, Kevan Ny Ditty, MD, 100 mg at 12/21/16 0902 .  gabapentin (NEURONTIN) capsule 300 mg, 300 mg, Oral, TID, Kevan Ny Ditty, MD, 300 mg at 12/21/16 0902 .  HYDROcodone-acetaminophen (NORCO/VICODIN) 5-325 MG per tablet 1-2 tablet, 1-2 tablet, Oral, Q4H PRN, Traci Sermon, PA-C, 1 tablet at 12/18/16 2120 .  insulin aspart (novoLOG) injection 0-15 Units, 0-15 Units, Subcutaneous, TID AC & HS, Jovita Gamma, MD, 0.3 Units at 12/21/16 1342 .  lisinopril (PRINIVIL,ZESTRIL) tablet 10 mg, 10 mg, Oral, Daily, Vista Mink Montgomery, PA-C, Stopped at 12/21/16 0910 .  magnesium citrate solution 1 Bottle, 1 Bottle, Oral, Once PRN, Vincent J Costella, PA-C .  menthol-cetylpyridinium (CEPACOL) lozenge 3 mg, 1 lozenge, Oral, PRN **OR** phenol (CHLORASEPTIC) mouth spray 1 spray, 1 spray, Mouth/Throat, PRN, Kevan Ny Ditty, MD .  metFORMIN (GLUCOPHAGE) tablet 500 mg, 500 mg, Oral, BID WC, Vincent J Costella, PA-C, 500 mg at 12/21/16 0723 .  methocarbamol (ROBAXIN) tablet 750 mg, 750 mg, Oral, QID, Kevan Ny Ditty, MD, 750 mg at 12/21/16 1448 .  multivitamin with minerals tablet 1 tablet, 1 tablet, Oral, Daily, Vista Mink Costella, PA-C, 1 tablet at 12/21/16 0902 .  norepinephrine (LEVOPHED) 4 mg in dextrose 5 % 250 mL (0.016 mg/mL) infusion, 0-40 mcg/min, Intravenous, Titrated,  Vincent J Costella, PA-C .  ondansetron (ZOFRAN) tablet 4 mg, 4 mg, Oral, Q6H PRN **OR** ondansetron (ZOFRAN) injection 4 mg, 4 mg, Intravenous, Q6H PRN, Kevan Ny Ditty, MD .  oxybutynin (OXYTROL) 3.9  MG/24HR patch 1 patch, 1 patch, Transdermal, Once every 5 days, Tamala Fothergill, MD, 1 patch at 12/19/16 1825 .  oxyCODONE (Oxy IR/ROXICODONE) immediate release tablet 5-10 mg, 5-10 mg, Oral, Q3H PRN, Kevan Ny Ditty, MD, 10 mg at 12/19/16 1731 .  oxyCODONE (OXYCONTIN) 12 hr tablet 20 mg, 20 mg, Oral, Q12H, Kevan Ny Ditty, MD, 20 mg at 12/21/16 0902 .  pantoprazole (PROTONIX) injection 40 mg, 40 mg, Intravenous, QHS, Kevan Ny Ditty, MD, 40 mg at 12/20/16 2214 .  promethazine (PHENERGAN) tablet 12.5 mg, 12.5 mg, Oral, Q6H PRN, Vista Mink Costella, PA-C .  scopolamine (TRANSDERM-SCOP) 1 MG/3DAYS 1.5 mg, 1 patch, Transdermal, Q72H, Nolon Nations, MD, 1.5 mg at 12/19/16 0940 .  senna (SENOKOT) tablet 8.6 mg, 1 tablet, Oral, BID, Tamala Fothergill, MD, Stopped at 12/21/16 [phone] .  senna-docusate (Senokot-S) tablet 1 tablet, 1 tablet, Oral, QHS PRN, Vista Mink Costella, PA-C .  sodium phosphate (FLEET) 7-19 GM/118ML enema 1 enema, 1 enema, Rectal, Once PRN, Kevan Ny Ditty, MD .  vancomycin (VANCOCIN) IVPB 1000 mg/200 mL premix, 1,000 mg, Intravenous, Q12H, Rebecka Apley, RPH, 1,000 mg at 12/21/16 1449 .  zolpidem (AMBIEN) tablet 5 mg, 5 mg, Oral, QHS PRN, Kevan Ny Ditty, MD  Patients Current Diet: Diet Carb Modified Fluid consistency: Thin; Room service appropriate? Yes  Precautions / Restrictions Precautions Precautions: Back Precaution Booklet Issued: No Precaution Comments: Pt able to recall 2/3 back precautions  Spinal Brace: Thoracolumbosacral orthotic, Applied in sitting position Restrictions Weight Bearing Restrictions: No   Has the patient had 2 or more falls or a fall with injury in the past year?Yes. Pt was Mod I with cane and driving prior to fall  10/08/16. Then decline in function to walk with RW, until 3 weeks prior to admission declined to scooter use only  Prior Activity Level Limited Community (1-2x/wk): Mod I cane prior to 10/08/16. progressed to RW and then declined into scooter  Development worker, international aid / [place] Devices/Equipment: Other (Comment) (motorized scooter) Home Equipment: Shower seat, [place] 2 wheels, [employer] - single point, Environmental consultant - 4 wheels, Grab bars - tub/shower  Prior Device Use: Indicate devices/aids used by the patient prior to current illness, exacerbation or injury? Motorized wheelchair or scooter and Radio producer Level Prior Function Level of Independence: Needs assistance Gait / Transfers Assistance Needed: Pt has had progressive weakness over past few months - for past 3 weeks, has required scooter for household and community amb secondary to BLE weakness; requires intermittent assist from husband to transfer to/from. Prior to this, she required RW for amb.  ADL's / Homemaking Assistance Needed: Husband performs cooking/cleaning; provides intermittent assist for dressing, bathing. Pt indep with pericare. Comments: Pt reports progressive decline in function due to weakenss over the past 3+ weeks.  Prior to that, pt was mod I and ambulatory   Self Care: Did the patient need help bathing, dressing, using the toilet or eating?  Needed some help  Indoor Mobility: Did the patient need assistance with walking from room to room (with or without device)? Needed some help  Stairs: Did the patient need assistance with internal or external stairs (with or without device)? Needed some help  Functional Cognition: Did the patient need help planning regular tasks such as shopping or remembering to take medications? Needed some help  Current Functional Level Cognition  Overall Cognitive Status: Within Functional Limits for tasks assessed Orientation Level: Oriented X4    Extremity  Assessment (includes  Sensation/Coordination)  Upper Extremity Assessment: Overall WFL for tasks assessed  Lower Extremity Assessment: Defer to PT evaluation RLE Deficits / Details: Hip flexion grossly 1/5, knee flex/ext grossly 2/5, ankle DF 1/5, ankle PF 3/5. Increased knee ext/flex tone RLE Sensation: decreased light touch, decreased proprioception (Propioception not formally tested) LLE Deficits / Details: LLE gross 3/5    ADLs  Overall ADL's : Needs assistance/impaired Eating/Feeding: Independent Grooming: Wash/dry hands, Wash/dry face, Oral care, Brushing hair, Set up, Bed level Upper Body Bathing: Moderate assistance, Bed level, Standing Lower Body Bathing: Maximal assistance, Bed level Upper Body Dressing : Maximal assistance, Sitting Lower Body Dressing: Total assistance, Bed level Toilet Transfer: Maximal assistance, +2 for physical assistance, Stand-pivot, BSC Toileting- Clothing Manipulation and Hygiene: Total assistance Functional mobility during ADLs: Moderate assistance, Maximal assistance, +2 for physical assistance    Mobility  Overal bed mobility: Needs Assistance Bed Mobility: Rolling, Sidelying to Sit, Sit to Sidelying Rolling: Min assist Sidelying to sit: Max assist Sit to sidelying: Max assist General bed mobility comments: Pt requires cues for log rolling.  Assist to flex bil. knees and assist to lift trunk     Transfers  Overall transfer level: Needs assistance Equipment used: Rolling walker (2 wheeled) Transfers: Sit to/from Stand Sit to Stand: Mod assist, +2 physical assistance General transfer comment: Pt stood x 3 with mod A +2, however, Rt knee flexed spontaneously resulting in no contact of Rt LE with the ground.  With input through Rt knee, she was unable to assist or extend Rt LE.  She was unable to achieve full extension of Lt LE with Lt LE sliding out from under her     Ambulation / Gait / Stairs / Wheelchair Mobility   Ambulation/Gait Ambulation/Gait assistance: +2 physical assistance, Mod assist Ambulation Distance (Feet): 2 Feet Assistive device: Rolling walker (2 wheeled) Gait Pattern/deviations: Step-to pattern General Gait Details: Amb a few steps to chair with RW and modA +2 for trunk support and physical assist to move R foot; pt with increased difficulty clearing RLE to bring it forward, better able to slide RLE backwards.     Posture / Balance Dynamic Sitting Balance Sitting balance - Comments: Pt requires bil. Ue support.  Worked on reaching within BOS - requires mod A due to posterior LOB  Balance Overall balance assessment: Needs assistance Sitting-balance support: Feet supported, Bilateral upper extremity supported Sitting balance-Leahy Scale: Poor Sitting balance - Comments: Pt requires bil. Ue support.  Worked on reaching within BOS - requires mod A due to posterior LOB  Postural control: Posterior lean Standing balance support: Bilateral upper extremity supported, During functional activity Standing balance-Leahy Scale: Poor Standing balance comment: Reliant on BUE support for standing balance; RLE instability    Special needs/care consideration BiPAP/CPAP  N/a CPM  N/a Continuous Drip IV  yes Dialysis  N/a Life Vest  N/a Oxygen  N/a Special Bed  N/a Trach Size  N/a Wound Vac (area)  N/a Skin surgical incision                    Bowel mgmt: no BM since admit Bladder mgmt: continent Diabetic mgmt  yes   Previous Home Environment Living Arrangements: Spouse/significant other  Lives With: Spouse Available Help at Discharge:  (spouse, neighbor and daughter can do 24/7) Type of Home: House Home Layout: One level Home Access: Stairs to enter Entrance Stairs-Rails: None Entrance Stairs-Number of Steps: 1 Bathroom Shower/Tub: Public librarian, Door, Architectural technologist: Standard Bathroom Accessibility: Yes Home  Care Services: No Additional Comments: Pt has been using  scooter at home and it does fit through bathroom door   Discharge Living Setting Plans for Discharge Living Setting: Patient's home, Lives with (comment) (spouse) Type of Home at Discharge: House Discharge Home Layout: One level Discharge Home Access: Stairs to enter Entrance Stairs-Rails: None Entrance Stairs-Number of Steps: 1 Discharge Bathroom Shower/Tub: Tub/shower unit, Curtain Discharge Bathroom Toilet: Standard Discharge Bathroom Accessibility: Yes How Accessible: Accessible via wheelchair, Accessible via walker (and scooter) Does the patient have any problems obtaining your medications?: No  Social/Family/Support Systems Patient Roles: Spouse, Parent Contact Information: spouse Anticipated Caregiver: spouse, daughter, and neighbor Anticipated Ambulance person Information: see above Ability/Limitations of Caregiver: spouse self employed Building control surveyor, can arrange his hrs to coordinate with daughter and neighbor as caregivers Caregiver Availability: 24/7 Discharge Plan Discussed with Primary Caregiver: Yes Is Caregiver In Agreement with Plan?: Yes Does Caregiver/Family have Issues with Lodging/Transportation while Pt is in Rehab?: No  Goals/Additional Needs Patient/Family Goal for Rehab: supervision to min assist with PT and OT Expected length of stay: [facility] 15- 20 days Pt/Family Agrees to Admission and willing to participate: Yes Program Orientation Provided & Reviewed with Pt/Caregiver Including Roles  & Responsibilities: Yes  Decrease burden of Care through IP rehab admission: n/a  Possible need for SNF placement upon discharge:not anticipated  Patient Condition: This patient's condition remains as documented in the consult dated 12/21/2016, in which the Rehabilitation Physician determined and documented that the patient's condition is appropriate for intensive rehabilitative care in an inpatient rehabilitation facility. Will admit to inpatient rehab  today.  Preadmission Screen Completed By:  Cleatrice Burke, 12/21/2016 3:43 PM ______________________________________________________________________   Discussed status with Dr. Naaman Plummer on 12/21/2016 at  1544 and received telephone approval for admission today.  Admission Coordinator:  Cleatrice Burke, time [id number] Date 12/21/2016       Cosigned by: Meredith Staggers, MD at 12/21/2016 4:16 PM  Revision History

## 2016-12-21 NOTE — Anesthesia Postprocedure Evaluation (Signed)
Anesthesia Post Note  Patient: Megan [name]  Procedure(s) Performed: Procedure(s) (LRB): LEFT THORACIC TEN  COSTO TRANSVERSECTOMY , LEFT THORACIC TEN-ELEVEN DISCECTOMY , THORACIC NINE -TWELVE  POSTERIOR FIXATION AND FUSION (Left) APPLICATION OF ROBOTIC ASSISTANCE FOR SPINAL PROCEDURE  Patient location during evaluation: PACU Anesthesia Type: General Level of consciousness: sedated and patient cooperative Pain management: pain level controlled Vital Signs Assessment: post-procedure vital signs reviewed and stable Respiratory status: spontaneous breathing Cardiovascular status: stable Anesthetic complications: no       Last Vitals:  Vitals:   12/21/16 0940 12/21/16 1614  BP: (!) 154/92 (!) 160/74  Pulse: 76 70  Resp: 15 16  Temp:  36.7 C    Last Pain:  Vitals:   12/21/16 1614  TempSrc: Oral  PainSc:                  Nolon Nations

## 2016-12-21 NOTE — PMR Pre-admission (Signed)
PMR Admission Coordinator Pre-Admission Assessment  Patient: Megan [name] is an 66 y.o., female MRN: [id number] DOB: 1951/08/23 Height: '5\' 8"'$  (172.7 cm) Weight: 74.8 kg (164 lb 12.8 oz)              Insurance Information HMO:    PPO: yes     PCP:      IPA:      80/20:      OTHER:  PRIMARY: [employer] [place]      Policy#: [id number]      Subscriber: pt CM Name: Megan [name]    Phone#: [phone]     Fax#: [phone] Pre-Cert#: [id number]      Employer: approved until 4/23 when updates are due Benefits:  Phone #: [phone]     Name: 12/21/16 Eff. Date: 09/13/16     Deduct: $7000      Out of Pocket Max: $7350 include deductible      Life Max: n/a CIR: 70%      SNF: 70% 60 days Outpatient: $75 co pay per visit     Co-Pay: 30 visits combined Home Health: 70%      Co-Pay: 30% DME: 70%     Co-Pay: 30% Providers: in network  SECONDARY: none       Medicaid Application Date:       Case Manager:  Disability Application Date:       Case Worker:   Emergency Facilities manager Information    Name Relation Home Work Mobile   [place] Spouse [phone]  [phone]     Current Medical History  Patient Admitting Diagnosis: thoracic myelopathy s/p fusion/fixation  History of Present Illness:HPI: Megan [name] a 66 y.o.right handed femalewith history of type 2 diabetes mellitus, hyperlipidemia, lumbar fusion L4-5 2016 and L2-3 2017, cervical fusion 2001. Presented 12/18/2016 with long-standing history of neck and back pain and progressive paraparesis. She denied any bowel or bladder dysfunction. 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 Underwent left T10 costotransversectomy, T10-11 laminectomy for decompression, dorsal internal fixation and fusion with bilateral pedicle screws T9-T12 12/19/2016 per Dr. Cyndy Freeze. Hospital course pain management.Decadron protocol as indicated with taper. TLSO back brace  applied in sitting position.Acute blood loss anemia 10.4 and monitored.  Past Medical History  Past Medical History:  Diagnosis Date  . Arthritis   . Borderline diabetic   . GERD (gastroesophageal reflux disease)   . History of bronchitis 2005  . History of kidney stones   . History of shingles   . Hyperlipidemia    takes Fish Oil daily  . Neuromuscular disorder (HCC)    tingling toes  . 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[place]) 12/15/2016  . Pneumonia 2009  . PONV (postoperative nausea and vomiting)   . Restless leg   . Type 2 diabetes mellitus ([place])   . Weakness    numbness and tingling in both feet r/t back    Family History  family history includes COPD in her father; Cirrhosis in her mother; Dementia in her mother; Diabetes in her brother and mother.  Prior Rehab/Hospitalizations:  Has the patient had major surgery during 100 days prior to admission? No  Current Medications   Current Facility-Administered Medications:  .  0.9 %  sodium chloride infusion, , Intravenous, Continuous, Kevan Ny Ditty, MD, Last Rate: 100 mL/hr at 12/21/16 0900 .  acetaminophen (TYLENOL) tablet 650 mg, 650 mg, Oral, Q6H PRN **OR** acetaminophen (TYLENOL) suppository 650 mg, 650 mg, Rectal, Q6H PRN, Evette Doffing  J Costella, PA-C .  acetaminophen (TYLENOL) tablet 1,000 mg, 1,000 mg, Oral, Q6H, Kevan Ny Ditty, MD, 1,000 mg at 12/21/16 1448 .  atorvastatin (LIPITOR) tablet 20 mg, 20 mg, Oral, QPM, Vincent J Costella, PA-C, 20 mg at 12/20/16 1739 .  bisacodyl (DULCOLAX) EC tablet 5 mg, 5 mg, Oral, Daily PRN, Kevan Ny Ditty, MD .  celecoxib (CELEBREX) capsule 200 mg, 200 mg, Oral, Q12H, Kevan Ny Ditty, MD, 200 mg at 12/21/16 0903 .  cholecalciferol (VITAMIN D) tablet 2,000 Units, 2,000 Units, Oral, Daily, [employer], PA-C, 2,000 Units at 12/21/16 8250 .  dexamethasone (DECADRON) injection 6 mg, 6 mg, Intravenous, Q6H, Vincent J Costella, PA-C, 6 mg at 12/21/16 1448 .  diazepam  (VALIUM) tablet 5 mg, 5 mg, Oral, Q6H PRN, Kevan Ny Ditty, MD, 5 mg at 12/21/16 0942 .  docusate sodium (COLACE) capsule 100 mg, 100 mg, Oral, BID, Kevan Ny Ditty, MD, 100 mg at 12/21/16 0902 .  gabapentin (NEURONTIN) capsule 300 mg, 300 mg, Oral, TID, Kevan Ny Ditty, MD, 300 mg at 12/21/16 0902 .  HYDROcodone-acetaminophen (NORCO/VICODIN) 5-325 MG per tablet 1-2 tablet, 1-2 tablet, Oral, Q4H PRN, Traci Sermon, PA-C, 1 tablet at 12/18/16 2120 .  insulin aspart (novoLOG) injection 0-15 Units, 0-15 Units, Subcutaneous, TID AC & HS, Jovita Gamma, MD, 0.3 Units at 12/21/16 1342 .  lisinopril (PRINIVIL,ZESTRIL) tablet 10 mg, 10 mg, Oral, Daily, Vista Mink [place], PA-C, Stopped at 12/21/16 0910 .  magnesium citrate solution 1 Bottle, 1 Bottle, Oral, Once PRN, Vincent J Costella, PA-C .  menthol-cetylpyridinium (CEPACOL) lozenge 3 mg, 1 lozenge, Oral, PRN **OR** phenol (CHLORASEPTIC) mouth spray 1 spray, 1 spray, Mouth/Throat, PRN, Kevan Ny Ditty, MD .  metFORMIN (GLUCOPHAGE) tablet 500 mg, 500 mg, Oral, BID WC, Vincent J Costella, PA-C, 500 mg at 12/21/16 0723 .  methocarbamol (ROBAXIN) tablet 750 mg, 750 mg, Oral, QID, Kevan Ny Ditty, MD, 750 mg at 12/21/16 1448 .  multivitamin with minerals tablet 1 tablet, 1 tablet, Oral, Daily, Vista Mink Costella, PA-C, 1 tablet at 12/21/16 0902 .  norepinephrine (LEVOPHED) 4 mg in dextrose 5 % 250 mL (0.016 mg/mL) infusion, 0-40 mcg/min, Intravenous, Titrated, Vincent J Costella, PA-C .  ondansetron (ZOFRAN) tablet 4 mg, 4 mg, Oral, Q6H PRN **OR** ondansetron (ZOFRAN) injection 4 mg, 4 mg, Intravenous, Q6H PRN, Kevan Ny Ditty, MD .  oxybutynin (OXYTROL) 3.9 MG/24HR patch 1 patch, 1 patch, Transdermal, Once every 5 days, Tamala Fothergill, MD, 1 patch at 12/19/16 1825 .  oxyCODONE (Oxy IR/ROXICODONE) immediate release tablet 5-10 mg, 5-10 mg, Oral, Q3H PRN, Kevan Ny Ditty, MD, 10 mg at 12/19/16 1731 .  oxyCODONE  (OXYCONTIN) 12 hr tablet 20 mg, 20 mg, Oral, Q12H, Kevan Ny Ditty, MD, 20 mg at 12/21/16 0902 .  pantoprazole (PROTONIX) injection 40 mg, 40 mg, Intravenous, QHS, Kevan Ny Ditty, MD, 40 mg at 12/20/16 2214 .  promethazine (PHENERGAN) tablet 12.5 mg, 12.5 mg, Oral, Q6H PRN, Vista Mink Costella, PA-C .  scopolamine (TRANSDERM-SCOP) 1 MG/3DAYS 1.5 mg, 1 patch, Transdermal, Q72H, Nolon Nations, MD, 1.5 mg at 12/19/16 0940 .  senna (SENOKOT) tablet 8.6 mg, 1 tablet, Oral, BID, Tamala Fothergill, MD, Stopped at 12/21/16 [phone] .  senna-docusate (Senokot-S) tablet 1 tablet, 1 tablet, Oral, QHS PRN, Vista Mink Costella, PA-C .  sodium phosphate (FLEET) 7-19 GM/118ML enema 1 enema, 1 enema, Rectal, Once PRN, Kevan Ny Ditty, MD .  vancomycin (VANCOCIN) IVPB 1000 mg/200 mL premix, 1,000 mg, Intravenous, Q12H, Georgina Snell  Drucilla Chalet, RPH, 1,000 mg at 12/21/16 1449 .  zolpidem (AMBIEN) tablet 5 mg, 5 mg, Oral, QHS PRN, Kevan Ny Ditty, MD  Patients Current Diet: Diet Carb Modified Fluid consistency: Thin; Room service appropriate? Yes  Precautions / Restrictions Precautions Precautions: Back Precaution Booklet Issued: No Precaution Comments: Pt able to recall 2/3 back precautions  Spinal Brace: Thoracolumbosacral orthotic, Applied in sitting position Restrictions Weight Bearing Restrictions: No   Has the patient had 2 or more falls or a fall with injury in the past year?Yes. Pt was Mod I with cane and driving prior to fall 10/08/16. Then decline in function to walk with RW, until 3 weeks prior to admission declined to scooter use only  Prior Activity Level Limited Community (1-2x/wk): Mod I cane prior to 10/08/16. progressed to RW and then declined into scooter  Development worker, international aid / [place] Devices/Equipment: Other (Comment) (motorized scooter) Home Equipment: Shower seat, [place] 2 wheels, [place] - single point, Environmental consultant - 4 wheels, Grab bars - tub/shower  Prior Device  Use: Indicate devices/aids used by the patient prior to current illness, exacerbation or injury? Motorized wheelchair or scooter and Radio producer Level Prior Function Level of Independence: Needs assistance Gait / Transfers Assistance Needed: Pt has had progressive weakness over past few months - for past 3 weeks, has required scooter for household and community amb secondary to BLE weakness; requires intermittent assist from husband to transfer to/from. Prior to this, she required RW for amb.  ADL's / Homemaking Assistance Needed: Husband performs cooking/cleaning; provides intermittent assist for dressing, bathing. Pt indep with pericare. Comments: Pt reports progressive decline in function due to weakenss over the past 3+ weeks.  Prior to that, pt was mod I and ambulatory   Self Care: Did the patient need help bathing, dressing, using the toilet or eating?  Needed some help  Indoor Mobility: Did the patient need assistance with walking from room to room (with or without device)? Needed some help  Stairs: Did the patient need assistance with internal or external stairs (with or without device)? Needed some help  Functional Cognition: Did the patient need help planning regular tasks such as shopping or remembering to take medications? Needed some help  Current Functional Level Cognition  Overall Cognitive Status: Within Functional Limits for tasks assessed Orientation Level: Oriented X4    Extremity Assessment (includes Sensation/Coordination)  Upper Extremity Assessment: Overall WFL for tasks assessed  Lower Extremity Assessment: Defer to PT evaluation RLE Deficits / Details: Hip flexion grossly 1/5, knee flex/ext grossly 2/5, ankle DF 1/5, ankle PF 3/5. Increased knee ext/flex tone RLE Sensation: decreased light touch, decreased proprioception (Propioception not formally tested) LLE Deficits / Details: LLE gross 3/5    ADLs  Overall ADL's : Needs  assistance/impaired Eating/Feeding: Independent Grooming: Wash/dry hands, Wash/dry face, Oral care, Brushing hair, Set up, Bed level Upper Body Bathing: Moderate assistance, Bed level, Standing Lower Body Bathing: Maximal assistance, Bed level Upper Body Dressing : Maximal assistance, Sitting Lower Body Dressing: Total assistance, Bed level Toilet Transfer: Maximal assistance, +2 for physical assistance, Stand-pivot, BSC Toileting- Clothing Manipulation and Hygiene: Total assistance Functional mobility during ADLs: Moderate assistance, Maximal assistance, +2 for physical assistance    Mobility  Overal bed mobility: Needs Assistance Bed Mobility: Rolling, Sidelying to Sit, Sit to Sidelying Rolling: Min assist Sidelying to sit: Max assist Sit to sidelying: Max assist General bed mobility comments: Pt requires cues for log rolling.  Assist to flex bil. knees and  assist to lift trunk     Transfers  Overall transfer level: Needs assistance Equipment used: Rolling walker (2 wheeled) Transfers: Sit to/from Stand Sit to Stand: Mod assist, +2 physical assistance General transfer comment: Pt stood x 3 with mod A +2, however, Rt knee flexed spontaneously resulting in no contact of Rt LE with the ground.  With input through Rt knee, she was unable to assist or extend Rt LE.  She was unable to achieve full extension of Lt LE with Lt LE sliding out from under her     Ambulation / Gait / Stairs / Wheelchair Mobility  Ambulation/Gait Ambulation/Gait assistance: +2 physical assistance, Mod assist Ambulation Distance (Feet): 2 Feet Assistive device: Rolling walker (2 wheeled) Gait Pattern/deviations: Step-to pattern General Gait Details: Amb a few steps to chair with RW and modA +2 for trunk support and physical assist to move R foot; pt with increased difficulty clearing RLE to bring it forward, better able to slide RLE backwards.     Posture / Balance Dynamic Sitting Balance Sitting balance -  Comments: Pt requires bil. Ue support.  Worked on reaching within BOS - requires mod A due to posterior LOB  Balance Overall balance assessment: Needs assistance Sitting-balance support: Feet supported, Bilateral upper extremity supported Sitting balance-Leahy Scale: Poor Sitting balance - Comments: Pt requires bil. Ue support.  Worked on reaching within BOS - requires mod A due to posterior LOB  Postural control: Posterior lean Standing balance support: Bilateral upper extremity supported, During functional activity Standing balance-Leahy Scale: Poor Standing balance comment: Reliant on BUE support for standing balance; RLE instability    Special needs/care consideration BiPAP/CPAP  N/a CPM  N/a Continuous Drip IV  yes Dialysis  N/a Life Vest  N/a Oxygen  N/a Special Bed  N/a Trach Size  N/a Wound Vac (area)  N/a Skin surgical incision                    Bowel mgmt: no BM since admit Bladder mgmt: continent Diabetic mgmt  yes   Previous Home Environment Living Arrangements: Spouse/significant other  Lives With: Spouse Available Help at Discharge:  (spouse, neighbor and daughter can do 24/7) Type of Home: House Home Layout: One level Home Access: Stairs to enter Entrance Stairs-Rails: None Technical brewer of Steps: 1 Bathroom Shower/Tub: Public librarian, Door, Architectural technologist: Programmer, systems: Yes Home Care Services: No Additional Comments: Pt has been using scooter at home and it does fit through bathroom door   Discharge Living Setting Plans for Discharge Living Setting: Patient's home, Lives with (comment) (spouse) Type of Home at Discharge: House Discharge Home Layout: One level Discharge Home Access: Stairs to enter Entrance Stairs-Rails: None Entrance Stairs-Number of Steps: 1 Discharge Bathroom Shower/Tub: Tub/shower unit, Curtain Discharge Bathroom Toilet: Standard Discharge Bathroom Accessibility: Yes How Accessible: Accessible  via wheelchair, Accessible via walker (and scooter) Does the patient have any problems obtaining your medications?: No  Social/Family/Support Systems Patient Roles: Spouse, Parent Contact Information: spouse Anticipated Caregiver: spouse, daughter, and neighbor Anticipated Ambulance person Information: see above Ability/Limitations of Caregiver: spouse self employed Building control surveyor, can arrange his hrs to coordinate with daughter and neighbor as caregivers Caregiver Availability: 24/7 Discharge Plan Discussed with Primary Caregiver: Yes Is Caregiver In Agreement with Plan?: Yes Does Caregiver/Family have Issues with Lodging/Transportation while Pt is in Rehab?: No  Goals/Additional Needs Patient/Family Goal for Rehab: supervision to min assist with PT and OT Expected length of stay: [facility] 15- 20 days Pt/Family Agrees  to Admission and willing to participate: Yes Program Orientation Provided & Reviewed with Pt/Caregiver Including Roles  & Responsibilities: Yes  Decrease burden of Care through IP rehab admission: n/a  Possible need for SNF placement upon discharge:not anticipated  Patient Condition: This patient's condition remains as documented in the consult dated 12/21/2016, in which the Rehabilitation Physician determined and documented that the patient's condition is appropriate for intensive rehabilitative care in an inpatient rehabilitation facility. Will admit to inpatient rehab today.  Preadmission Screen Completed By:  Cleatrice Burke, 12/21/2016 3:43 PM ______________________________________________________________________   Discussed status with Dr. Naaman Plummer on 12/21/2016 at  1544 and received telephone approval for admission today.  Admission Coordinator:  Cleatrice Burke, time 6431 Date 12/21/2016

## 2016-12-21 NOTE — Progress Notes (Signed)
Patient and family were informed about rehab process including  Patient safety plan and rehab booklet.

## 2016-12-22 ENCOUNTER — Inpatient Hospital Stay (HOSPITAL_COMMUNITY): Payer: BLUE CROSS/BLUE SHIELD | Admitting: Physical Therapy

## 2016-12-22 ENCOUNTER — Inpatient Hospital Stay (HOSPITAL_COMMUNITY): Payer: BLUE CROSS/BLUE SHIELD

## 2016-12-22 ENCOUNTER — Inpatient Hospital Stay (HOSPITAL_COMMUNITY): Payer: BLUE CROSS/BLUE SHIELD | Admitting: Occupational Therapy

## 2016-12-22 DIAGNOSIS — G959 Disease of spinal cord, unspecified: Secondary | ICD-10-CM

## 2016-12-22 DIAGNOSIS — G8220 Paraplegia, unspecified: Secondary | ICD-10-CM

## 2016-12-22 LAB — CBC WITH DIFFERENTIAL/PLATELET
Basophils Absolute: 0 10*3/uL (ref 0.0–0.1)
Basophils Relative: 0 %
Eosinophils Absolute: 0 10*3/uL (ref 0.0–0.7)
Eosinophils Relative: 0 %
HCT: 35.6 % — ABNORMAL LOW (ref 36.0–46.0)
HEMOGLOBIN: 12 g/dL (ref 12.0–15.0)
LYMPHS ABS: 1.4 10*3/uL (ref 0.7–4.0)
Lymphocytes Relative: 11 %
MCH: 29.9 pg (ref 26.0–34.0)
MCHC: 33.7 g/dL (ref 30.0–36.0)
MCV: 88.8 fL (ref 78.0–100.0)
MONO ABS: 0.7 10*3/uL (ref 0.1–1.0)
MONOS PCT: 6 %
NEUTROS PCT: 83 %
Neutro Abs: 10.1 10*3/uL — ABNORMAL HIGH (ref 1.7–7.7)
Platelets: 188 10*3/uL (ref 150–400)
RBC: 4.01 MIL/uL (ref 3.87–5.11)
RDW: 13.7 % (ref 11.5–15.5)
WBC: 12.2 10*3/uL — ABNORMAL HIGH (ref 4.0–10.5)

## 2016-12-22 LAB — COMPREHENSIVE METABOLIC PANEL
ALK PHOS: 66 U/L (ref 38–126)
ALT: 22 U/L (ref 14–54)
AST: 17 U/L (ref 15–41)
Albumin: 3.4 g/dL — ABNORMAL LOW (ref 3.5–5.0)
Anion gap: 9 (ref 5–15)
BILIRUBIN TOTAL: 0.6 mg/dL (ref 0.3–1.2)
BUN: 14 mg/dL (ref 6–20)
CALCIUM: 8.6 mg/dL — AB (ref 8.9–10.3)
CO2: 26 mmol/L (ref 22–32)
Chloride: 100 mmol/L — ABNORMAL LOW (ref 101–111)
Creatinine, Ser: 0.77 mg/dL (ref 0.44–1.00)
GFR calc non Af Amer: >60 mL/min (ref >60)
Glucose, Bld: 156 mg/dL — ABNORMAL HIGH (ref 65–99)
POTASSIUM: 4 mmol/L (ref 3.5–5.1)
Sodium: 135 mmol/L (ref 135–145)
TOTAL PROTEIN: 6.2 g/dL — AB (ref 6.5–8.1)

## 2016-12-22 LAB — GLUCOSE, CAPILLARY
GLUCOSE-CAPILLARY: 217 mg/dL — AB (ref 65–99)
GLUCOSE-CAPILLARY: 163 mg/dL — AB (ref 65–99)
Glucose-Capillary: 196 mg/dL — ABNORMAL HIGH (ref 65–99)
Glucose-Capillary: 129 mg/dL — ABNORMAL HIGH (ref 65–99)

## 2016-12-22 MED ORDER — DEXAMETHASONE 4 MG PO TABS
8.0000 mg | ORAL_TABLET | Freq: Every day | ORAL | Status: DC
  Administered 2016-12-22 – 2016-12-26 (×5): 8 mg via ORAL
  Filled 2016-12-22 (×5): qty 2

## 2016-12-22 MED ORDER — POLYETHYLENE GLYCOL 3350 17 G PO PACK
17.0000 g | PACK | Freq: Every day | ORAL | Status: DC | PRN
  Administered 2016-12-22 – 2016-12-25 (×2): 17 g via ORAL
  Filled 2016-12-22 (×2): qty 1

## 2016-12-22 NOTE — Evaluation (Signed)
Occupational Therapy Assessment and Plan  Patient Details  Name: Megan [name] MRN: [id number] Date of Birth: 02/24/1951  OT Diagnosis: abnormal posture, muscle weakness (generalized) and decreased sensation, hypertonia, coordination disorder Rehab Potential: Rehab Potential (ACUTE ONLY): Good ELOS: 14-18 days   Today's Date: 12/22/2016 OT Individual Time: 1445-1600 OT Individual Time Calculation (min): 75 min     Problem List:  Patient Active Problem List   Diagnosis Date Noted  . Myelopathy ([place]) 12/21/2016  . Surgery, elective   . Diabetes mellitus type 2 in nonobese (HCC)   . History of lumbar fusion   . History of fusion of cervical spine   . Neuropathic pain   . Post-operative pain   . 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[place]) 12/15/2016  . Pedal edema 12/14/2016  . Type 2 diabetes mellitus without complication, without long-term current use of insulin ([place]) 11/02/2016  . Hyperlipidemia 11/02/2016  . Essential hypertension 11/02/2016  . 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    Past Medical History:  Past Medical History:  Diagnosis Date  . Arthritis   . Borderline diabetic   . GERD (gastroesophageal reflux disease)   . History of bronchitis 2005  . History of kidney stones   . History of shingles   . Hyperlipidemia    takes Fish Oil daily  . Neuromuscular disorder (HCC)    tingling toes  . 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[place]) 12/15/2016  . Pneumonia 2009  . PONV (postoperative nausea and vomiting)   . Restless leg   . Type 2 diabetes mellitus ([place])   . Weakness    numbness and tingling in both feet r/t back   Past Surgical History:  Past Surgical History:  Procedure Laterality Date  . ABDOMINAL HYSTERECTOMY  1979  . APPLICATION OF ROBOTIC ASSISTANCE FOR SPINAL PROCEDURE  12/19/2016   Procedure: APPLICATION OF ROBOTIC ASSISTANCE  FOR SPINAL PROCEDURE;  Surgeon: Kevan Ny Ditty, MD;  Location: [facility];  Service: Neurosurgery;;  . blADder tacked   1991  . CERVICAL FUSION     2001  . HERNIA REPAIR  1991  . LUMBAR FUSION  2016   L4-5  . LUMBAR FUSION  2017   L2-3  . TONSILLECTOMY AND ADENOIDECTOMY  1959  . TUMOR EXCISION     WERTHIN'S TUMORS BOTH SIDES OF NECK    Assessment & Plan Clinical Impression: Patient is a 66 y.o. year old female with history of type 2 diabetes mellitus, hyperlipidemia, lumbar fusion L4-5 2016 and L2-3 2017, cervical fusion 2001. Per chart review patient lives with spouse. One level home 1 step to entry. She had been using a scooter for household and community ambulation secondary to bilateral lower extremity weakness for the past few weeks prior to she had used a rolling walker for ambulation. Husband works during the day as well as a local daughter who also works, but husband states he can provide Nurse, mental health.Presented 12/18/2016 with long-standing history of neck and back pain and progressive paraparesis. She denied any bowel or bladder dysfunction. 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 Underwent left T10 costotransversectomy, T10-11 laminectomy for decompression, dorsal internal fixation and fusion with bilateral pedicle screws T9-T12 12/19/2016 per Dr. Cyndy Freeze. Hospital course pain management.Decadron protocol as indicated with taper.TLSO back brace applied in sitting position.Acute blood loss anemia 10.4 and monitored. Physical and occupational therapy evaluations completed with recommendations of  physical medicine rehabilitation consult. Patient was admitted for a comprehensive rehabilitation program.Patient transferred to CIR on 12/21/2016 .    Patient currently requires mod - total A with basic self-care skills secondary to muscle weakness, decreased cardiorespiratoy endurance and decreased sitting balance, decreased  standing balance, decreased balance strategies and hypertonia.  Prior to hospitalization, patient could complete ADLS with modified independent .  Patient will benefit from skilled intervention to decrease level of assist with basic self-care skills prior to discharge home with care partner.  Anticipate patient will require 24 hour supervision and minimal physical assistance and follow up home health.  OT - End of Session Activity Tolerance: Decreased this session Endurance Deficit: Yes Endurance Deficit Description: fatigues quickly with activity, requiring multiple rest breaks throughout session.  OT Assessment Rehab Potential (ACUTE ONLY): Good Barriers to Discharge: Other (comment) Barriers to Discharge Comments: none noted OT Patient demonstrates impairments in the following area(s): Balance;Endurance;Motor;Pain;Safety;Sensory OT Basic ADL's Functional Problem(s): Grooming;Bathing;Dressing;Toileting OT Transfers Functional Problem(s): Toilet OT Additional Impairment(s): None OT Plan OT Intensity: Minimum of 1-2 x/day, 45 to 90 minutes OT Frequency: 5 out of 7 days OT Duration/Estimated Length of Stay: 14-18 days OT Treatment/Interventions: Balance/vestibular training;Self Care/advanced ADL retraining;Neuromuscular re-education;Therapeutic Exercise;Wheelchair propulsion/positioning;UE/LE Strength taining/ROM;Pain management;DME/adaptive equipment instruction;Community reintegration;Patient/family education;UE/LE Coordination activities;Discharge planning;Functional mobility training;Therapeutic Activities;Psychosocial support OT Self Feeding Anticipated Outcome(s): mod I  OT Basic Self-Care Anticipated Outcome(s): mod I - min A OT Toileting Anticipated Outcome(s): min A OT Bathroom Transfers Anticipated Outcome(s): min A OT Recommendation Recommendations for Other Services: Neuropsych consult;Therapeutic Recreation consult Therapeutic Recreation Interventions: Pet therapy Patient  destination: Home Follow Up Recommendations: 24 hour supervision/assistance Equipment Recommended: To be determined   Skilled Therapeutic Intervention Upon entering the room, pt supine in bed awaiting therapist with no c/o pain. Pt stating 3/3 back precautions correctly. Log roll to EOB with mod A. OT assisted pt in donning lumbar brace with total A. Sit >stand with use of STEDY and mod lifting assistance. Pt transferred to wheelchair and seated at sink for bathing and dressing tasks. Pt standing inside of STEDY and able to wash peri area. Pt unable to wash buttocks without twisting and therefore needing assistance. Pt returning to wheelchair at end of session. Call bell within reach. OT educated pt on OT purpose, POC, and goals with pt verbalizing understanding and agreement.   OT Evaluation Precautions/Restrictions  Precautions Precautions: Back Precaution Comments: reviewed back precautions with pt  Required Braces or Orthoses: Spinal Brace Spinal Brace: Thoracolumbosacral orthotic;Applied in sitting position Restrictions Weight Bearing Restrictions: No  Pain Pain Assessment Pain Assessment: No/denies pain Home Living/Prior Functioning Home Living Available Help at Discharge: Family, Available 24 hours/day Type of Home: House Home Access: Ramped entrance Entrance Stairs-Rails: None Home Layout: One level Bathroom Shower/Tub: Tub/shower unit, Door, Architectural technologist: Standard Bathroom Accessibility: Yes Additional Comments: Spouse works but runs his own company and can come and go as needed.   Lives With: Spouse Prior Function Level of Independence: Needs assistance with tranfers, Needs assistance with gait, Needs assistance with homemaking, Needs assistance with ADLs  Able to Take Stairs?: No Driving: No Comments: using scooter for mobility around the house. Spouse providing intermittent assist with ADLs. Prior to onset of weakness pt was ambulatory with SPC.   Vision/Perception  Vision- History Baseline Vision/History: Wears glasses Wears Glasses: Reading only Patient Visual Report: No change from baseline  Cognition Overall Cognitive Status: Within Functional Limits for tasks assessed Arousal/Alertness: Awake/alert Orientation Level: Person;Place;Situation Person: Oriented Place: Oriented Situation: Oriented Year:  2018 Month: April Day of Week: Correct Memory: Appears intact Immediate Memory Recall: Sock;Blue;Bed Memory Recall: Sock;Blue;Bed Memory Recall Sock: Without Cue Memory Recall Blue: Without Cue Memory Recall Bed: Without Cue Safety/Judgment: Appears intact Sensation Sensation Light Touch: Impaired by gross assessment Additional Comments: Diminished sensation through bilateral LEs, LLE pt able to localize light touch in distal leg/foot. Rt LE unable to locate light touch to great toe but was able to localize shin, fifth toe and heel  Coordination Gross Motor Movements are Fluid and Coordinated: No Fine Motor Movements are Fluid and Coordinated: No Motor  Motor Motor: Abnormal tone Motor - Skilled Clinical Observations: Extensor tone noted through Rt LE, clonus noted with Rt dorsiflexion Mobility  Bed Mobility Bed Mobility: Supine to Sit Supine to Sit: 2: Max assist  Balance Balance Balance Assessed: Yes Static Sitting Balance Static Sitting - Balance Support: Bilateral upper extremity supported Static Sitting - Level of Assistance: 5: Stand by assistance Static Standing Balance Static Standing - Balance Support: Bilateral upper extremity supported Static Standing - Level of Assistance: 4: Min assist Extremity/Trunk Assessment RUE Assessment RUE Assessment: Within Functional Limits LUE Assessment LUE Assessment: Exceptions to [facility] (3-/5 weakness throughout but functionally attempts to use throughout session)   See Function Navigator for Current Functional Status.   Refer to Care Plan for Long Term  Goals  Recommendations for other services: Neuropsych and Therapeutic Recreation  Pet therapy   Discharge Criteria: Patient will be discharged from OT if patient refuses treatment 3 consecutive times without medical reason, if treatment goals not met, if there is a change in medical status, if patient makes no progress towards goals or if patient is discharged from hospital.  The above assessment, treatment plan, treatment alternatives and goals were discussed and mutually agreed upon: by patient  Gypsy Decant 12/22/2016, 5:04 PM

## 2016-12-22 NOTE — Progress Notes (Signed)
Patient information reviewed and entered into eRehab system by Gordana Kewley, RN, CRRN, PPS Coordinator.  Information including medical coding and functional independence measure will be reviewed and updated through discharge.    

## 2016-12-22 NOTE — Progress Notes (Signed)
Physical Therapy Assessment and Plan  Patient Details  Name: Megan [name] MRN: [id number] Date of Birth: 02-Sep-1951  PT Diagnosis: Abnormal posture, Abnormality of gait, Coordination disorder, Difficulty walking, Hypertonia, Impaired sensation, Muscle spasms, Muscle weakness and Pain in back Rehab Potential: Good ELOS: 15-20 days   Today's Date: 12/22/2016 PT Individual Time: 0800-0930 PT Individual Time Calculation (min): 90 min    Problem List:  Patient Active Problem List   Diagnosis Date Noted  . Myelopathy ([place]) 12/21/2016  . Surgery, elective   . Diabetes mellitus type 2 in nonobese (HCC)   . History of lumbar fusion   . History of fusion of cervical spine   . Neuropathic pain   . Post-operative pain   . 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[place]) 12/15/2016  . Pedal edema 12/14/2016  . Type 2 diabetes mellitus without complication, without long-term current use of insulin ([place]) 11/02/2016  . Hyperlipidemia 11/02/2016  . Essential hypertension 11/02/2016  . 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    Past Medical History:  Past Medical History:  Diagnosis Date  . Arthritis   . Borderline diabetic   . GERD (gastroesophageal reflux disease)   . History of bronchitis 2005  . History of kidney stones   . History of shingles   . Hyperlipidemia    takes Fish Oil daily  . Neuromuscular disorder (HCC)    tingling toes  . 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[place]) 12/15/2016  . Pneumonia 2009  . PONV (postoperative nausea and vomiting)   . Restless leg   . Type 2 diabetes mellitus ([place])   . Weakness    numbness and tingling in both feet r/t back   Past Surgical History:  Past Surgical History:  Procedure Laterality Date  . ABDOMINAL HYSTERECTOMY  1979  . APPLICATION OF ROBOTIC ASSISTANCE FOR SPINAL PROCEDURE  12/19/2016   Procedure: APPLICATION  OF ROBOTIC ASSISTANCE FOR SPINAL PROCEDURE;  Surgeon: Kevan Ny Ditty, MD;  Location: [facility];  Service: Neurosurgery;;  . blADder tacked   1991  . CERVICAL FUSION     2001  . HERNIA REPAIR  1991  . LUMBAR FUSION  2016   L4-5  . LUMBAR FUSION  2017   L2-3  . TONSILLECTOMY AND ADENOIDECTOMY  1959  . TUMOR EXCISION     WERTHIN'S TUMORS BOTH SIDES OF NECK    Assessment & Plan Clinical Impression: .  Patient is a 66 y.o.right handed femalewith history of type 2 diabetes mellitus, hyperlipidemia, lumbar fusion L4-5 2016 and L2-3 2017, cervical fusion 2001. Per chart review patient lives with spouse. One level home 1 step to entry. She had been using a scooter for household and community ambulation secondary to bilateral lower extremity weakness for the past few weeks prior to she had used a rolling walker for ambulation. Husband works during the day as well as a local daughter who also works, but husband states he can provide Nurse, mental health.Presented 12/18/2016 with long-standing history of neck and back pain and progressive paraparesis. She denied any bowel or bladder dysfunction. 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 Underwent left T10 costotransversectomy, T10-11 laminectomy for decompression, dorsal internal fixation and fusion with bilateral pedicle screws T9-T12 12/19/2016 per Dr. Cyndy Freeze. Hospital course pain management.Decadron protocol as indicated with taper.TLSO back brace applied in sitting position.Acute blood loss anemia 10.4 and monitored. transferred to CIR on 12/21/2016 .  Patient currently requires mod with mobility secondary to muscle weakness and muscle joint tightness, impaired timing and sequencing, abnormal tone, unbalanced muscle activation, decreased coordination, decreased motor planning and paraparesis and decreased sitting balance, decreased standing balance, decreased postural control, decreased  balance strategies and difficulty maintaining precautions.  Prior to hospitalization, patient was modified independent  with mobility and lived with Spouse in a House home.  Home access is  Ramped entrance.  Patient will benefit from skilled PT intervention to maximize safe functional mobility and minimize fall risk for planned discharge home with intermittent assist.  Anticipate patient will benefit from follow up [facility] at discharge.  PT - End of Session Activity Tolerance: Tolerates 30+ min activity with multiple rests Endurance Deficit: Yes Endurance Deficit Description: fatigues quickly with activity, requiring multiple rest breaks throughout session.  PT Assessment Rehab Potential (ACUTE/IP ONLY): Good PT Patient demonstrates impairments in the following area(s): Balance;Endurance;Motor;Pain;Sensory PT Transfers Functional Problem(s): Bed Mobility;Bed to Chair;Car;Furniture;Floor PT Locomotion Functional Problem(s): Ambulation;Wheelchair Mobility;Stairs PT Plan PT Intensity: Minimum of 1-2 x/day ,45 to 90 minutes PT Frequency: 5 out of 7 days PT Duration Estimated Length of Stay: 15-20 days PT Treatment/Interventions: Ambulation/gait training;Balance/vestibular training;Community reintegration;Discharge planning;Disease management/prevention;DME/adaptive equipment instruction;Functional electrical stimulation;Functional mobility training;Neuromuscular re-education;Pain management;Patient/family education;Psychosocial support;Splinting/orthotics;Stair training;Therapeutic Activities;Therapeutic Exercise;UE/LE Strength taining/ROM;UE/LE Coordination activities;Wheelchair propulsion/positioning PT Transfers Anticipated Outcome(s): modified independent with LRAD PT Locomotion Anticipated Outcome(s): mod I w/c level, ambulating short distances with LRAD and min assist.    PT Recommendation Recommendations for Other Services: Therapeutic Recreation consult Therapeutic Recreation Interventions:  Kitchen group;Outing/community reintergration Follow Up Recommendations: Home health PT Patient destination: Home Equipment Recommended: Wheelchair (measurements);Wheelchair cushion (measurements) Equipment Details: pt has electric scooter, rw, SPC, and BSC.  Skilled Therapeutic Intervention Pt seen for initial evaluation and treatment. Pt in bed upon arrival, reports feeling mild nausea. Nursing notified and medication provided as well as pain meds. Working on Oceanographer with bed mobility (mod assist) as well as donning TLSO. Initial standing from EOB performed with stedy and mod assist. In stedy, able to perform weight shift without support on LE block. Repeating X2. Transfer via stedy to w/c. Pt able to propel w/c with supervision and cues, education on brakes provided. Lateral scoot performed w/c to mat table with mod assist. Rt LE extensor tone noted to increase during transfer. Supported rest break needed after transfer. Repeated back to w/c. Standing performed in parallel bars with assist at pelvis and Rt LE initially. Pt returned to room, up in w/c with all needs in reach after session.   PT Evaluation Precautions/Restrictions Precautions Precautions: Back Precaution Booklet Issued: No Precaution Comments: reviewed back precautions with pt and spouse Required Braces or Orthoses: Spinal Brace Spinal Brace: Thoracolumbosacral orthotic;Applied in sitting position Restrictions Weight Bearing Restrictions: No Pain Pain Assessment 3/10 Reports pain primarily in thoracic region. Nursing giving pain meds during session.  Home Living/Prior Functioning Home Living Available Help at Discharge: Family;Available 24 hours/day Type of Home: House Home Access: Ramped entrance Home Layout: One level Additional Comments: Spouse works but runs his own company and can come and go as needed.   Lives With: Spouse Prior Function Level of Independence: Needs assistance with tranfers;Needs  assistance with gait;Needs assistance with homemaking;Needs assistance with ADLs  Able to Take Stairs?: No Driving: No Comments: using scooter for mobility around the house. Spouse providing intermittent assist with ADLs. Prior to onset of weakness pt was ambulatory with SPC.  Vision/Perception  Vision - History Baseline Vision: No visual deficits Perception Perception: Within  Functional Limits  Cognition Overall Cognitive Status: Within Functional Limits for tasks assessed Arousal/Alertness: Awake/alert Orientation Level: Oriented X4 Memory: Appears intact Safety/Judgment: Appears intact Sensation Sensation Light Touch: Impaired by gross assessment Additional Comments: Diminished sensation through bilateral LEs, LLE pt able to localize light touch in distal leg/foot. Rt LE unable to locate light touch to great toe but was able to localize shin, fifth toe and heel  Coordination Gross Motor Movements are Fluid and Coordinated: No Fine Motor Movements are Fluid and Coordinated: No Coordination and Movement Description: difficulty performing active motion with bilateral LEs due to weakness.  Motor  Motor Motor: Abnormal tone Motor - Skilled Clinical Observations: Extensor tone noted through Rt LE, clonus noted with Rt dorsiflexion     Balance Static Sitting Balance Static Sitting - Balance Support: Bilateral upper extremity supported Static Sitting - Level of Assistance: 5: Stand by assistance (with UE support) Dynamic Sitting Balance- requiring single UE support for reaching activities, minimal excursion.  Static Standing Balance Static Standing - Balance Support: Bilateral upper extremity supported Static Standing - Level of Assistance: 4: Min assist Dynamic Standing Balance Dynamic Standing - Balance Support:  (unable to tolerate) Extremity Assessment  RUE Assessment RUE Assessment: Within Functional Limits   RLE PROM (degrees) Right Ankle Dorsiflexion:  (neutral with  moderate overpressure) RLE Strength Right Hip Flexion: 2-/5 Right Knee Extension: 2+/5 Right Ankle Dorsiflexion: 0/5 RLE Tone RLE Tone: Moderate LLE PROM (degrees) Left Ankle Dorsiflexion:  (neutral with min overpressure) LLE Strength Left Hip Flexion: 3/5 Left Knee Extension: 3+/5 Left Ankle Dorsiflexion: 3/5   See Function Navigator for Current Functional Status.   Refer to Care Plan for Long Term Goals  Recommendations for other services: Therapeutic Recreation  Kitchen group and Outing/community reintegration  Discharge Criteria: Patient will be discharged from PT if patient refuses treatment 3 consecutive times without medical reason, if treatment goals not met, if there is a change in medical status, if patient makes no progress towards goals or if patient is discharged from hospital.  The above assessment, treatment plan, treatment alternatives and goals were discussed and mutually agreed upon: by patient and by family.   Cassell Clement, PT, CSCS Pager [phone] Office [phone]  12/22/2016, 12:09 PM

## 2016-12-22 NOTE — Progress Notes (Signed)
Subjective/Complaints: Moved her right leg better today, did not have bowel or bladder issues pre op  ROS-  Denies CP, SOB N/V/D no pain in LE, feels numb in legs  Objective: Vital Signs: Blood pressure (!) 159/76, pulse 72, temperature 97.5 F (36.4 C), temperature source Oral, resp. rate 16, weight 78.2 kg (172 lb 6.4 oz), SpO2 95 %. No results found. Results for orders placed or performed during the hospital encounter of 12/21/16 (from the past 72 hour(s))  Glucose, capillary     Status: Abnormal   Collection Time: 12/21/16  9:18 PM  Result Value Ref Range   Glucose-Capillary 158 (H) 65 - 99 mg/dL  CBC WITH DIFFERENTIAL     Status: Abnormal   Collection Time: 12/22/16  4:36 AM  Result Value Ref Range   WBC 12.2 (H) 4.0 - 10.5 K/uL   RBC 4.01 3.87 - 5.11 MIL/uL   Hemoglobin 12.0 12.0 - 15.0 g/dL   HCT 35.6 (L) 36.0 - 46.0 %   MCV 88.8 78.0 - 100.0 fL   MCH 29.9 26.0 - 34.0 pg   MCHC 33.7 30.0 - 36.0 g/dL   RDW 13.7 11.5 - 15.5 %   Platelets 188 150 - 400 K/uL   Neutrophils Relative % 83 %   Neutro Abs 10.1 (H) 1.7 - 7.7 K/uL   Lymphocytes Relative 11 %   Lymphs Abs 1.4 0.7 - 4.0 K/uL   Monocytes Relative 6 %   Monocytes Absolute 0.7 0.1 - 1.0 K/uL   Eosinophils Relative 0 %   Eosinophils Absolute 0.0 0.0 - 0.7 K/uL   Basophils Relative 0 %   Basophils Absolute 0.0 0.0 - 0.1 K/uL  Comprehensive metabolic panel     Status: Abnormal   Collection Time: 12/22/16  4:36 AM  Result Value Ref Range   Sodium 135 135 - 145 mmol/L   Potassium 4.0 3.5 - 5.1 mmol/L   Chloride 100 (L) 101 - 111 mmol/L   CO2 26 22 - 32 mmol/L   Glucose, Bld 156 (H) 65 - 99 mg/dL   BUN 14 6 - 20 mg/dL   Creatinine, Ser 0.77 0.44 - 1.00 mg/dL   Calcium 8.6 (L) 8.9 - 10.3 mg/dL   Total Protein 6.2 (L) 6.5 - 8.1 g/dL   Albumin 3.4 (L) 3.5 - 5.0 g/dL   AST 17 15 - 41 U/L   ALT 22 14 - 54 U/L   Alkaline Phosphatase 66 38 - 126 U/L   Total Bilirubin 0.6 0.3 - 1.2 mg/dL   GFR calc non Af Amer >60  >60 mL/min   GFR calc Af Amer >60 >60 mL/min    Comment: (NOTE) The eGFR has been calculated using the CKD EPI equation. This calculation has not been validated in all clinical situations. eGFR's persistently <60 mL/min signify possible Chronic Kidney Disease.    Anion gap 9 5 - 15  Glucose, capillary     Status: Abnormal   Collection Time: 12/22/16  7:08 AM  Result Value Ref Range   Glucose-Capillary 196 (H) 65 - 99 mg/dL     HEENT: normal Cardio: RRR and no murmur Resp: CTA B/L and unlabored GI: BS positive and NT, ND Extremity:  No Edema Skin:   Other cannot see incision up in chair with TLSO Neuro: Alert/Oriented, Abnormal Sensory Normal sensation in BUE, absent bilateral L5 sensation to LT as well as right L3 and Abnormal Motor 5/5 in BUE, 3- Right knee ext ,0/5 Right ankle DF, LLE 3/5 KE  3- ADF, 3 L Hip ext Musc/Skel:  Other no pain with UE or LE ROM, full AROM in BUE Gen NAD   Assessment/Plan: 1. 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 Physiatrist is providing close team supervision and 24 hour management of active medical problems listed below. Physiatrist and rehab team continue to assess barriers to discharge/monitor patient progress toward functional and medical goals. FIM:                                  Medical Problem List and Plan: 1. 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 Back brace when out of bed 2. DVT Prophylaxis/Anticoagulation: SCDs. Check vascular study 3. Pain Management:OxyContin sustained release 20 mg every 12 hours Celebrex 200 mg every every 12 hours, Neurontin 300 mg 3 times a day, Robaxin 750 mg 4 times a day, Hydrocodone as needed 4. Mood: Provide emotional support 5. Neuropsych: This  patient iscapable of making decisions on herown behalf. 6. Skin/Wound Care: Routine skin checks 7. Fluids/Electrolytes/Nutrition: Routine I&O with follow-up chemistries 8.Diabetes mellitus. Glucophage 500 mg twice a day. Check blood sugars before meals and at bedtime. Monitor closely while on Decadron taper 9.Hypertension. Lisinopril 10 mg daily. 10.Hyperlipidemia. Lipitor 11.Overactive bladder. Oxytrol patch. Check PVR 3 12.Constipation. Laxative assistance 13.GERD. Protonix  LOS (Days) 1 A FACE TO FACE EVALUATION WAS PERFORMED  Megan [name] 12/22/2016, 8:51 AM

## 2016-12-22 NOTE — Progress Notes (Signed)
Physical Therapy Session Note  Patient Details  Name: Megan [name] MRN: [id number] Date of Birth: [birth year]  Today's Date: 12/22/2016 PT Individual Time: 1004-1030 PT Individual Time Calculation (min): 26 min   Short Term Goals: Week 1:  PT Short Term Goal 1 (Week 1): Pt to perfrorm bed<>w/c transfers with min assist PT Short Term Goal 2 (Week 1): Pt to tolerate static standing X10 minutes with LRAD PT Short Term Goal 3 (Week 1): Pt to perform static sitting balance X 10 minutes.  PT Short Term Goal 4 (Week 1): Pt to perform supine<>sitting with supervision using logroll technique.  PT Short Term Goal 5 (Week 1): Pt to don/doff TLSO with supervision in sitting.   Skilled Therapeutic Interventions/Progress Updates:   Pt received sitting in WC and agreeable to PT.   WC mobility through hall x 125f x 2 with supervision assist from PT with min cues for proper use of BUE in turns and for obstacle navigation.   ModifiedSquat pivot transfer training with mod-max assist from PT. Increased assist with transfer to the L compared to the R. Mild RLE extensor tone noted in transfer. Moderate cues for proper anteiror weight shift to prevent LOB in transfer  Static Sitting balance x 5 minutes with UE support on thighs.  Min assist progressing to supervision assist form PT. Min cues for proper neutral trunk positioning to prevent posterior LOB.   Following treatmentPatient returned too room and [facility] and all needs met.     Therapy Documentation Precautions:  Precautions Precautions: Back Precaution Booklet Issued: No Precaution Comments: reviewed back precautions with pt and spouse Required Braces or Orthoses: Spinal Brace Spinal Brace: Thoracolumbosacral orthotic, Applied in sitting position Restrictions Weight Bearing Restrictions: No Pain: 6/10 with movement in Thoracic spine  See Function Navigator for Current Functional  Status.   Therapy/Group: Individual Therapy  ALorie Phenix4/07/2017, 1:43 PM

## 2016-12-23 ENCOUNTER — Inpatient Hospital Stay (HOSPITAL_COMMUNITY): Payer: BLUE CROSS/BLUE SHIELD | Admitting: Physical Therapy

## 2016-12-23 ENCOUNTER — Inpatient Hospital Stay (HOSPITAL_COMMUNITY): Payer: BLUE CROSS/BLUE SHIELD | Admitting: Occupational Therapy

## 2016-12-23 ENCOUNTER — Encounter (HOSPITAL_COMMUNITY): Payer: BLUE CROSS/BLUE SHIELD

## 2016-12-23 ENCOUNTER — Inpatient Hospital Stay (HOSPITAL_COMMUNITY): Payer: BLUE CROSS/BLUE SHIELD

## 2016-12-23 DIAGNOSIS — K592 Neurogenic bowel, not elsewhere classified: Secondary | ICD-10-CM

## 2016-12-23 DIAGNOSIS — N312 Flaccid neuropathic bladder, not elsewhere classified: Secondary | ICD-10-CM

## 2016-12-23 LAB — GLUCOSE, CAPILLARY
GLUCOSE-CAPILLARY: 91 mg/dL (ref 65–99)
GLUCOSE-CAPILLARY: 142 mg/dL — AB (ref 65–99)
Glucose-Capillary: 160 mg/dL — ABNORMAL HIGH (ref 65–99)
Glucose-Capillary: 138 mg/dL — ABNORMAL HIGH (ref 65–99)

## 2016-12-23 MED ORDER — BISACODYL 10 MG RE SUPP
10.0000 mg | Freq: Every day | RECTAL | Status: DC
  Administered 2016-12-23 – 2016-12-30 (×3): 10 mg via RECTAL
  Filled 2016-12-23 (×8): qty 1

## 2016-12-23 NOTE — Progress Notes (Signed)
Subjective/Complaints:  Patient states that she was only on tramadol at home for pain and did not take this on a regular basis. Her pain control at night is good. She still has some pain during therapy. Numbness, improving in legs  No voiding after Foley removed, remains constipated as well  ROS-  Denies CP, SOB N/V/D no pain in LE, feels numb in legs  Objective: Vital Signs: Blood pressure 133/80, pulse 75, temperature 98.1 F (36.7 C), temperature source Oral, resp. rate 18, weight 78.2 kg (172 lb 6.4 oz), SpO2 99 %. No results found. Results for orders placed or performed during the hospital encounter of 12/21/16 (from the past 72 hour(s))  Glucose, capillary     Status: Abnormal   Collection Time: 12/21/16  9:18 PM  Result Value Ref Range   Glucose-Capillary 158 (H) 65 - 99 mg/dL  CBC WITH DIFFERENTIAL     Status: Abnormal   Collection Time: 12/22/16  4:36 AM  Result Value Ref Range   WBC 12.2 (H) 4.0 - 10.5 K/uL   RBC 4.01 3.87 - 5.11 MIL/uL   Hemoglobin 12.0 12.0 - 15.0 g/dL   HCT 35.6 (L) 36.0 - 46.0 %   MCV 88.8 78.0 - 100.0 fL   MCH 29.9 26.0 - 34.0 pg   MCHC 33.7 30.0 - 36.0 g/dL   RDW 13.7 11.5 - 15.5 %   Platelets 188 150 - 400 K/uL   Neutrophils Relative % 83 %   Neutro Abs 10.1 (H) 1.7 - 7.7 K/uL   Lymphocytes Relative 11 %   Lymphs Abs 1.4 0.7 - 4.0 K/uL   Monocytes Relative 6 %   Monocytes Absolute 0.7 0.1 - 1.0 K/uL   Eosinophils Relative 0 %   Eosinophils Absolute 0.0 0.0 - 0.7 K/uL   Basophils Relative 0 %   Basophils Absolute 0.0 0.0 - 0.1 K/uL  Comprehensive metabolic panel     Status: Abnormal   Collection Time: 12/22/16  4:36 AM  Result Value Ref Range   Sodium 135 135 - 145 mmol/L   Potassium 4.0 3.5 - 5.1 mmol/L   Chloride 100 (L) 101 - 111 mmol/L   CO2 26 22 - 32 mmol/L   Glucose, Bld 156 (H) 65 - 99 mg/dL   BUN 14 6 - 20 mg/dL   Creatinine, Ser 0.77 0.44 - 1.00 mg/dL   Calcium 8.6 (L) 8.9 - 10.3 mg/dL   Total Protein 6.2 (L) 6.5 - 8.1  g/dL   Albumin 3.4 (L) 3.5 - 5.0 g/dL   AST 17 15 - 41 U/L   ALT 22 14 - 54 U/L   Alkaline Phosphatase 66 38 - 126 U/L   Total Bilirubin 0.6 0.3 - 1.2 mg/dL   GFR calc non Af Amer >60 >60 mL/min   GFR calc Af Amer >60 >60 mL/min    Comment: (NOTE) The eGFR has been calculated using the CKD EPI equation. This calculation has not been validated in all clinical situations. eGFR's persistently <60 mL/min signify possible Chronic Kidney Disease.    Anion gap 9 5 - 15  Glucose, capillary     Status: Abnormal   Collection Time: 12/22/16  7:08 AM  Result Value Ref Range   Glucose-Capillary 196 (H) 65 - 99 mg/dL  Glucose, capillary     Status: Abnormal   Collection Time: 12/22/16 11:43 AM  Result Value Ref Range   Glucose-Capillary 217 (H) 65 - 99 mg/dL   Comment 1 Notify RN   Glucose, capillary  Status: Abnormal   Collection Time: 12/22/16  5:14 PM  Result Value Ref Range   Glucose-Capillary 129 (H) 65 - 99 mg/dL   Comment 1 Notify RN   Glucose, capillary     Status: Abnormal   Collection Time: 12/22/16 10:10 PM  Result Value Ref Range   Glucose-Capillary 163 (H) 65 - 99 mg/dL     HEENT: normal Cardio: RRR and no murmur Resp: CTA B/L and unlabored GI: BS positive and NT, ND Extremity:  No Edema Skin:   Honeycomb dressing, dried blood along incision, but no erythema, no fluctuance, drain site well healed Neuro: Alert/Oriented, Abnormal Sensory Normal sensation in BUE, absent right L5 sensation to LT as well as right L3 and Abnormal Motor 5/5 in BUE, 3- Right knee ext ,0/5 Right ankle DF, LLE 3/5 KE 3- ADF, 3 L Hip ext Musc/Skel:  Other no pain with UE or LE ROM, full AROM in BUE Gen NAD   Assessment/Plan: 1. 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 Physiatrist is providing close team supervision and 24 hour management of active medical problems listed  below. Physiatrist and rehab team continue to assess barriers to discharge/monitor patient progress toward functional and medical goals. FIM: Function - Bathing Position: Wheelchair/chair at sink Body parts bathed by patient: Right arm, Left arm, Chest, Abdomen, Front perineal area, Right upper leg, Left upper leg Body parts bathed by helper: Buttocks, Right lower leg, Left lower leg, Back Assist Level:  (mod A)  Function- Upper Body Dressing/Undressing What is the patient wearing?: Hospital gown, Orthosis Orthosis activity level: Performed by helper Assist Level: Set up Set up : To obtain clothing/put away, To apply TLSO, cervical collar Function - Lower Body Dressing/Undressing What is the patient wearing?: Non-skid slipper socks Position: Wheelchair/chair at sink Non-skid slipper socks- Performed by helper: Don/doff right sock, Don/doff left sock Assist for footwear: Dependant  Function - Toileting Toileting activity did not occur: No continent bowel/bladder event  Function - Air cabin crew transfer activity did not occur: Safety/medical concerns  Function - Chair/bed transfer Chair/bed transfer method: Squat pivot Chair/bed transfer assist level: Moderate assist (Pt 50 - 74%/lift or lower) Chair/bed transfer assistive device: Armrests Mechanical lift: Stedy Chair/bed transfer details: Tactile cues for weight bearing, Tactile cues for weight shifting, Visual cues/gestures for precautions/safety, Verbal cues for sequencing, Verbal cues for technique, Verbal cues for precautions/safety, Verbal cues for safe use of DME/AE, Manual facilitation for weight shifting, Manual facilitation for weight bearing  Function - Locomotion: Wheelchair Will patient use wheelchair at discharge?: Yes Type: Manual Max wheelchair distance: 110f Assist Level: Supervision or verbal cues Assist Level: Supervision or verbal cues Assist Level: Supervision or verbal cues Function - Locomotion:  Ambulation Ambulation activity did not occur: Safety/medical concerns Walk 10 feet activity did not occur: Safety/medical concerns Walk 50 feet with 2 turns activity did not occur: Safety/medical concerns Walk 150 feet activity did not occur: Safety/medical concerns Walk 10 feet on uneven surfaces activity did not occur: Safety/medical concerns  Function - Comprehension Comprehension: Auditory Comprehension assist level: Follows complex conversation/direction with extra time/assistive device  Function - Expression Expression: Verbal Expression assist level: Expresses complex ideas: With no assist  Function - Social Interaction Social Interaction assist level: Interacts appropriately with others with medication or extra time (anti-anxiety, antidepressant).  Function - Problem Solving Problem solving assist level: Solves complex problems: With extra time  Function - Memory Memory assist level:  Complete Independence: No helper Patient normally able to recall (first 3 days only): Current season, Location of own room, Staff names and faces, That he or she is in a hospital  Medical Problem List and Plan: 1. 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 Back brace when out of bed Continue CIR PT, OT 2. DVT Prophylaxis/Anticoagulation: SCDs. Check vascular study 3. Pain Management:OxyContin sustained release 20 mg every 12 hours Celebrex 200 mg every every 12 hours, Neurontin 300 mg 3 times a day, Robaxin 750 mg 4 times a day, Hydrocodone as needed 4. Mood: Provide emotional support 5. Neuropsych: This patient iscapable of making decisions on herown behalf. 6. Skin/Wound Care: Routine skin checks 7. Fluids/Electrolytes/Nutrition: Routine I&O with follow-up chemistries 8.Diabetes mellitus. Glucophage 500 mg twice a day. Check blood sugars before meals and at  bedtime. Monitor closely while on Decadron taper CBG (last 3)   Recent Labs  12/22/16 1714 12/22/16 2210 12/23/16 0641  GLUCAP 129* 163* 91    9.Hypertension. Lisinopril 10 mg daily. Vitals:   12/22/16 2249 12/23/16 0643  BP: (!) 156/72 133/80  Pulse: 63 75  Resp: 18 18  Temp: 97.8 F (36.6 C) 98.1 F (36.7 C)   10.Hyperlipidemia. Lipitor 11.Overactive bladder. Oxytrol patch. Now appears to have neurogenic bladder. Will discontinue also will discontinue scopolamine as this may be inhibiting bladder emptying 12.Constipation. Neurogenic bowel, establish bowel program with Dulcolax 13.GERD. Protonix  LOS (Days) 2 A FACE TO FACE EVALUATION WAS PERFORMED  Caston Coopersmith E 12/23/2016, 6:43 AM

## 2016-12-23 NOTE — Progress Notes (Signed)
Physical Therapy Session Note  Patient Details  Name: Megan [name] MRN: [id number] Date of Birth: 04-28-1951  Today's Date: 12/23/2016 PT Individual Time: 1006-1103 PT Individual Time Calculation (min): 57 min   Short Term Goals: Week 1:  PT Short Term Goal 1 (Week 1): Pt to perfrorm bed<>w/c transfers with min assist PT Short Term Goal 2 (Week 1): Pt to tolerate static standing X10 minutes with LRAD PT Short Term Goal 3 (Week 1): Pt to perform static sitting balance X 10 minutes.  PT Short Term Goal 4 (Week 1): Pt to perform supine<>sitting with supervision using logroll technique.  PT Short Term Goal 5 (Week 1): Pt to don/doff TLSO with supervision in sitting.   Skilled Therapeutic Interventions/Progress Updates:    no c/o pain session focus on activity tolerance and functional mobility.    Pt performs rolling in bed L (supervision) and R (mod assist) with bed rails.  Assist provided for positioning of RLE throughout. Supine>sit x2  with bed rails and mod assist to elevate trunk.  Dressing EOB with total assist for clothing management 2/2 pain, fatigue, and pt fear of falling without BUE support.  Stedy used to transfer to w/c for time management. Pt propels w/c to therapy gym with supervision and increased time.  Sit<>Stand x2 in // bars with steady assist and verbal cues for upright posture/hip extension.  Initiation of gait training in // bars with +2 assist for safety, 1 providing tactile cues for hip extension and upright posture, 1 to advance/place/stabilize RLE.  Pt returned to room at end of session and positioned in w/c with call bell in reach and needs met.   Therapy Documentation Precautions:  Precautions Precautions: Back Precaution Booklet Issued: No Precaution Comments: reviewed back precautions with pt  Required Braces or Orthoses: Spinal Brace Spinal Brace: Thoracolumbosacral orthotic, Applied in sitting position Restrictions Weight Bearing Restrictions: No   See  Function Navigator for Current Functional Status.   Therapy/Group: Individual Therapy  [name] E Penven-Crew 12/23/2016, 1:00 PM

## 2016-12-23 NOTE — Progress Notes (Signed)
Occupational Therapy Session Note  Patient Details  Name: Megan [name] MRN: [id number] Date of Birth: 1951/07/26  Today's Date: 12/23/2016 OT Individual Time: 1103-1200 OT Individual Time Calculation (min): 57 min   Short Term Goals: Week 1:  OT Short Term Goal 1 (Week 1): Pt will perform toilet transfer with mod A in order to decrease level of assistance. OT Short Term Goal 2 (Week 1): Pt will maintain standing balance for 2 minutes during LB clothing management and hygiene with min A. OT Short Term Goal 3 (Week 1): Pt will perform LB dressing with mod A in order to decrease level of assistance. OT Short Term Goal 4 (Week 1): Pt will engage in 10 minutes of functional task with 2 rest breaks or less.   Skilled Therapeutic Interventions/Progress Updates:    OT treatment session focused on improved sit<>stand, LB strengthening, and standing balance/endurance. Pt declined bathing/dressing as she completed ADL with PT this am. Discussed OT goals and bathing/dressing strategies using long-handled ADL AE- will incorporate into b/d session tomorrow.  Pt propelled wc to therapy gym and completed 3 sit<>stands with mod A in standing frame. Focus on weight shifting onto R LE and standing endurance with pt tolerating 1 minute at longest standing bout. Pt relied heavily on UE support, had pt alternate UE with reaching activity in standing with Min A to maintain balance.  Pt unable to tolerate 3rd sit<>stand for longer than 15 seconds 2/2 dizziness and fatigue. LB there-ex focused on R LE- Hip aB/aDduction using lite yellow thera band 10 x3.with extended rest breaks between sets. Pt propelled wc back to room and left seated in wc with needs met.   Therapy Documentation Precautions:  Precautions Precautions: Back Precaution Booklet Issued: No Precaution Comments: reviewed back precautions with pt  Required Braces or Orthoses: Spinal Brace Spinal Brace: Thoracolumbosacral orthotic, Applied in sitting  position Restrictions Weight Bearing Restrictions: No Pain: Pain Assessment Pain Assessment: No/denies pain  See Function Navigator for Current Functional Status.   Therapy/Group: Individual Therapy  [name] 12/23/2016, 12:36 PM

## 2016-12-23 NOTE — Progress Notes (Signed)
Social Work Patient ID: Megan [name], female   DOB: 1950-11-26, 66 y.o.   MRN: [id number]   CSW met with pt 12-22-16 after team conference and explained to her that we did not fully conference on her today due to it being the day of her evaluations and all therapists had not seen her yet.  She expressed understanding.  CSW told her that we anticipated she will need to be on CIR for about 2 weeks, however.  CSW will continue to keep pt updated.

## 2016-12-23 NOTE — Progress Notes (Signed)
Social Work Patient ID: Megan [name], female   DOB: 24-Nov-1950, 66 y.o.   MRN: [id number]                     Team Conference                                                                                       12/22/16     11:25 AM  Team conference attendees:  Dr. Alysia Penna; Dr. Ilean Skill; Meriel Pica, OT; Dwyane Dee, PT; Weston Anna, SLP; Heather Roberts, RN; Daiva Nakayama, RN, [place] Coordinator; Alfonse Alpers, LCSW  Dr. Letta Pate discussed that pt is here with a spinal cord injury requiring T10-11 laminectomy decompression, now with incomplete paraparesis and poor sensation in lower extremities.  Pt with foley at time of conference, but he feels that pt will regain bladder function.  Therapists had not evaluated pt yet, but it is expected that pt will need 15 to 20 days on CIR.  Evaluations/goals pending.  CSW will work with pt/family to transition pt to home.

## 2016-12-23 NOTE — Progress Notes (Signed)
[facility] Individual Statement of Services  Patient Name:  DYANNE YORKS  Date:  12/23/2016  Welcome to the [facility].  Our goal is to provide you with an individualized program based on your diagnosis and situation, designed to meet your specific needs.  With this comprehensive rehabilitation program, you will be expected to participate in at least 3 hours of rehabilitation therapies Monday-Friday, with modified therapy programming on the weekends.  Your rehabilitation program will include the following services:  Physical Therapy (PT), Occupational Therapy (OT), Speech Therapy (ST), 24 hour per day rehabilitation nursing, Therapeutic Recreaction (TR), Case Management (Social Worker), Rehabilitation Medicine, Nutrition Services and Pharmacy Services  Weekly team conferences will be held on Wednesdays to discuss your progress.  Your Social Worker will talk with you frequently to get your input and to update you on team discussions.  Team conferences with you and your family in attendance may also be held.  Expected length of stay: 14 to 20 days Overall anticipated outcome: Supervision to Minimal Assistance; will need moderate assistance with stairs  Depending on your progress and recovery, your program may change. Your Social Worker will coordinate services and will keep you informed of any changes. Your Social Worker's name and contact numbers are listed  below.  The following services may also be recommended but are not provided by the [facility] will be made to provide these services after discharge if needed.  Arrangements include referral to agencies that provide these services.  Your insurance has been verified to be:  [employer] Your primary doctor is:  Alma Friendly, NP  Pertinent  information will be shared with your doctor and your insurance company.  Social Worker:  Alfonse Alpers, LCSW  [phone] or [phone]  Information discussed with and copy given to patient by: Trey Sailors, 12/23/2016, 11:22 AM

## 2016-12-23 NOTE — Progress Notes (Signed)
Physical Therapy Note  Patient Details  Name: Megan [name] MRN: [id number] Date of Birth: 09-01-1951 Today's Date: 12/23/2016  1300-1400, 60 min individual tx Pain: none reported TLSO already donned  w/c propulsion to/from therapy, using bil UEs.  Pt improved in efficiency and turns after instruction by PT.  Slide board tranfser to L to NuStep.  neuromuscular re-education via forced use for bil LE alternating movement and RLE maxx extension. NuStep at level 2-3 x 5 minutes.  With LLE removed from foot plate, pt able to extend R foot plate x 25. In sitting, pt performed gravity eliminated R knee extension, hip abd/adduction with R foot on wheeled board.  Sitting balance work on EOM with R/L hand raises and eventually bil hands raises, briefly, limited by burning pain R low back..  Pt propelled w/c back to room modified independent.    See function navigator for current status.   Romond Pipkins 12/23/2016, 1:20 PM

## 2016-12-23 NOTE — Progress Notes (Signed)
Physical Therapy Session Note  Patient Details  Name: Megan [name] MRN: [id number] Date of Birth: 07/21/1951  Today's Date: 12/23/2016 PT Individual Time: 1415-1445 PT Individual Time Calculation (min): 30 min   Short Term Goals: Week 1:  PT Short Term Goal 1 (Week 1): Pt to perfrorm bed<>w/c transfers with min assist PT Short Term Goal 2 (Week 1): Pt to tolerate static standing X10 minutes with LRAD PT Short Term Goal 3 (Week 1): Pt to perform static sitting balance X 10 minutes.  PT Short Term Goal 4 (Week 1): Pt to perform supine<>sitting with supervision using logroll technique.  PT Short Term Goal 5 (Week 1): Pt to don/doff TLSO with supervision in sitting.   Skilled Therapeutic Interventions/Progress Updates:    no c/o pain, session focus on NMR via ambulation with EVA walker.  Pt transitions sit<>stand pushing up from w/c with BUEs and mod assist for forward weight shift.  Gait x12' with min +2 assist for safety.  Seated rest break and pt able to complete second sit<>stand pushing up with LUE with steady assist.  Gait x6' again with min +2 for safety.  Pt requires assist for RLE advance/placement but is able to stabilize knee without assist.  PT noted genu recurvatum in RLE with stance phase.  Discussed options for orthotics to assist with this later on if necessary.  Pt returned to room at end of session with call bell in reach and needs met.    Therapy Documentation Precautions:  Precautions Precautions: Back Precaution Booklet Issued: No Precaution Comments: reviewed back precautions with pt  Required Braces or Orthoses: Spinal Brace Spinal Brace: Thoracolumbosacral orthotic, Applied in sitting position Restrictions Weight Bearing Restrictions: No   See Function Navigator for Current Functional Status.   Therapy/Group: Individual Therapy  Earnest Conroy Penven-Crew 12/23/2016, 4:55 PM

## 2016-12-23 NOTE — Progress Notes (Signed)
12/23/16 1658 nursing  RN called back vascular tech re: lower ext dopplers. Per vascular they cannot do today due to emergency cases, they will reschedule tomorrow.

## 2016-12-23 NOTE — Progress Notes (Signed)
Social Work Assessment and Plan  Patient Details  Name: Megan [name] MRN: [id number] Date of Birth: 1956-10-29  Today's Date: 12/22/2016  Problem List:  Patient Active Problem List   Diagnosis Date Noted  . Myelopathy ([place]) 12/21/2016  . Surgery, elective   . Diabetes mellitus type 2 in nonobese (HCC)   . History of lumbar fusion   . History of fusion of cervical spine   . Neuropathic pain   . Post-operative pain   . 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[place]) 12/15/2016  . Pedal edema 12/14/2016  . Type 2 diabetes mellitus without complication, without long-term current use of insulin ([place]) 11/02/2016  . Hyperlipidemia 11/02/2016  . Essential hypertension 11/02/2016  . 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   Past Medical History:  Past Medical History:  Diagnosis Date  . Arthritis   . Borderline diabetic   . GERD (gastroesophageal reflux disease)   . History of bronchitis 2005  . History of kidney stones   . History of shingles   . Hyperlipidemia    takes Fish Oil daily  . Neuromuscular disorder (HCC)    tingling toes  . 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[place]) 12/15/2016  . Pneumonia 2009  . PONV (postoperative nausea and vomiting)   . Restless leg   . Type 2 diabetes mellitus ([place])   . Weakness    numbness and tingling in both feet r/t back   Past Surgical History:  Past Surgical History:  Procedure Laterality Date  . ABDOMINAL HYSTERECTOMY  1979  . APPLICATION OF ROBOTIC ASSISTANCE FOR SPINAL PROCEDURE  12/19/2016   Procedure: APPLICATION OF ROBOTIC ASSISTANCE FOR SPINAL PROCEDURE;  Surgeon: Kevan Ny Ditty, MD;  Location: [facility];  Service: Neurosurgery;;  . blADder tacked   1991  . CERVICAL FUSION     2001  . HERNIA REPAIR  1991  . LUMBAR FUSION  2016   L4-5  . LUMBAR FUSION  2017   L2-3  . TONSILLECTOMY AND ADENOIDECTOMY   1959  . TUMOR EXCISION     WERTHIN'S TUMORS BOTH SIDES OF NECK   Social History:  reports that she quit smoking about 10 years ago. Her smoking use included Cigarettes. She smoked 1.50 packs per day. She has never used smokeless tobacco. She reports that she drinks alcohol. She reports that she does not use drugs.  Family / Support Systems Marital Status: Married How Long?: 64 years Patient Roles: Spouse, Parent, Other (Comment) (grandparent; neighbor; friend) Spouse/Significant Other: Tehillah Cipriani - husband - [phone] (h);  [phone] (m) Children: dtr and son-in law; 88 y/o granddtr Other Supports: neighbor Anticipated Caregiver: spouse, daughter, and neighbor Ability/Limitations of Caregiver: spouse self employed Building control surveyor, can arrange his hrs to coordinate with daughter and neighbor as caregivers Caregiver Availability: 24/7 Family Dynamics: Pt reports husband is very supportive and she could not have made it without him.  Social History Preferred language: English Religion: Baptist Read: Yes Write: Yes Employment Status: Retired Date Retired/Disabled/Unemployed: June 2017 Age Retired: 66 Legal History/Current Legal Issues: none reported Guardian/Conservator: N/A - Dr. Letta Pate has determined that pt is capable of making her own decisions.   Abuse/Neglect Physical Abuse: Denies Verbal Abuse: Denies Sexual Abuse: Denies Exploitation of patient/patient's resources: Denies Self-Neglect: Denies  Emotional Status Pt's affect, behavior and adjustment status: Pt is upbeat and positive about her recovery and reports coping well with multiple surgeries, although recognizes  this was the hardest of the three back surgeries. Recent Psychosocial Issues: Multiple surgeries, losing function, not being able to do household chores, cook, etc Psychiatric History: none reported Substance Abuse History: none reported  Patient / Family Perceptions, Expectations & Goals Pt/Family  understanding of illness & functional limitations: Pt reports a good understanding of her condition and limitations.  She does not have any questions right now that have not been answered by the medical team. Premorbid pt/family roles/activities: Pt enjoys her granddtr and spending time with family.  She also reads. Anticipated changes in roles/activities/participation: Pt wants to resume the above as soon as possible and would also like to be able to do more around the house like she used to.  Wants to take some off of her husband. Pt/family expectations/goals: Pt wants to "be able to walk with just a cane eventually" (but is okay with the walker, too) and "to be able to cook again and help around the house."  [employer]: Other (Comment) (went to [facility] in [place]) Premorbid Home Care/DME Agencies: Other (Comment) (has a cane, walker, shower seat, bedside commode, ramped entrance to home, and a borrowed Transport planner) Transportation available at discharge: family Resource referrals recommended: Neuropsychology  Discharge Planning Living Arrangements: Spouse/significant other Support Systems: Spouse/significant other, Children, Other relatives, Friends/neighbors Type of Residence: Private residence Insurance Resources: Multimedia programmer (specify) ([employer]) Museum/gallery curator Resources: Fish farm manager, Family Support Financial Screen Referred: No Money Management: Spouse Does the patient have any problems obtaining your medications?: No Home Management: Husband is doing all of this. Patient/Family Preliminary Plans: Pt plans to go home with her husband, dtr, and neighbor to all assist her. Social Work Anticipated Follow Up Needs: HH/OP Expected length of stay: ELOS 15- 20 days  Clinical Impression CSW met with pt to introduce self and role of CSW, as well as to complete assessment.  Pt was talkative with CSW and appreciative of time spent with  her.  She reports strong family support, especially from her husband, and does not have any concerns about going home with her family and neighbor to assist.  Pt has only received therapy as an outpt and has not had [facility] in the past.  Pt has some DME, but is open to therapists' recommendations.  Pt's family to come in for family education closer to d/c.  Pt reportedly is in good spirits and is motivated to rehabilitate.  CSW will continue to follow and assist as needed.  Noel Henandez, Silvestre Mesi 12/23/2016, 3:48 PM

## 2016-12-24 ENCOUNTER — Inpatient Hospital Stay (HOSPITAL_COMMUNITY): Payer: BLUE CROSS/BLUE SHIELD | Admitting: Occupational Therapy

## 2016-12-24 ENCOUNTER — Inpatient Hospital Stay (HOSPITAL_COMMUNITY): Payer: BLUE CROSS/BLUE SHIELD

## 2016-12-24 ENCOUNTER — Other Ambulatory Visit: Payer: BLUE CROSS/BLUE SHIELD

## 2016-12-24 ENCOUNTER — Inpatient Hospital Stay (HOSPITAL_COMMUNITY): Payer: BLUE CROSS/BLUE SHIELD | Admitting: Physical Therapy

## 2016-12-24 DIAGNOSIS — M7989 Other specified soft tissue disorders: Secondary | ICD-10-CM

## 2016-12-24 LAB — GLUCOSE, CAPILLARY
GLUCOSE-CAPILLARY: 81 mg/dL (ref 65–99)
GLUCOSE-CAPILLARY: 178 mg/dL — AB (ref 65–99)
GLUCOSE-CAPILLARY: 199 mg/dL — AB (ref 65–99)
GLUCOSE-CAPILLARY: 151 mg/dL — AB (ref 65–99)

## 2016-12-24 NOTE — IPOC Note (Signed)
Overall Plan of Care [facility]) Patient Details Name: Megan [name] MRN: [id number] DOB: May 17, 1951  Admitting Diagnosis: Thoracic Myelopathy  Hospital Problems: Active Problems:   Myelopathy [facility])     Functional Problem List: Nursing Bladder, Bowel, Edema, Endurance, Medication Management, Motor, Pain, Safety, Sensory, Skin Integrity  PT Balance, Endurance, Motor, Pain, Sensory  OT Balance, Endurance, Motor, Pain, Safety, Sensory  SLP    TR         Basic ADL's: OT Grooming, Bathing, Dressing, Toileting     Advanced  ADL's: OT       Transfers: PT Bed Mobility, Bed to Chair, Car, [employer], Floor  OT Toilet     Locomotion: PT Ambulation, Emergency planning/management officer, Stairs     Additional Impairments: OT None  SLP        TR      Anticipated Outcomes Item Anticipated Outcome  Self Feeding mod I   Swallowing      Basic self-care  mod I - min A  Toileting  min A   Bathroom Transfers min A  Bowel/Bladder  min assist  Transfers  modified independent with LRAD  Locomotion  mod I w/c level, ambulating short distances with LRAD and min assist.     Communication     Cognition     Pain  less<3  Safety/Judgment  min assist.   Therapy Plan: PT Intensity: Minimum of 1-2 x/day ,45 to 90 minutes PT Frequency: 5 out of 7 days PT Duration Estimated Length of Stay: 15-20 days OT Intensity: Minimum of 1-2 x/day, 45 to 90 minutes OT Frequency: 5 out of 7 days OT Duration/Estimated Length of Stay: 14-18 days         Team Interventions: Nursing Interventions Patient/Family Education, Bladder Management, Bowel Management, Disease Management/Prevention, Pain Management, Medication Management, Skin Care/Wound Management, Discharge Planning, Psychosocial Support  PT interventions Ambulation/gait training, Training and development officer, Community reintegration, Discharge planning, Disease management/prevention, DME/adaptive equipment instruction, Functional electrical  stimulation, Functional mobility training, Neuromuscular re-education, Pain management, Patient/family education, Psychosocial support, Splinting/orthotics, Stair training, Therapeutic Activities, Therapeutic Exercise, UE/LE Strength taining/ROM, UE/LE Coordination activities, Wheelchair propulsion/positioning  OT Interventions Training and development officer, Self Care/advanced ADL retraining, Neuromuscular re-education, Therapeutic Exercise, Wheelchair propulsion/positioning, UE/LE Strength taining/ROM, Pain management, DME/adaptive equipment instruction, Community reintegration, Barrister's clerk education, UE/LE Coordination activities, Discharge planning, Functional mobility training, Therapeutic Activities, Psychosocial support  SLP Interventions    TR Interventions    SW/CM Interventions Discharge Planning, Psychosocial Support, Patient/Family Education    Team Discharge Planning: Destination: PT-Home ,OT- Home , SLP-  Projected Follow-up: PT-Home health PT, OT-  24 hour supervision/assistance, SLP-  Projected Equipment Needs: PT-Wheelchair (measurements), Wheelchair cushion (measurements), OT- To be determined, SLP-  Equipment Details: PT-pt has electric scooter, rw, SPC, and BSC., OT-  Patient/family involved in discharge planning: PT- Family member/caregiver, Patient (spouse Megan [name]),  OT-Patient, SLP-   MD ELOS: 13-15d Medical Rehab Prognosis:  Good Assessment:  66 y.o.right handed femalewith history of type 2 diabetes mellitus, hyperlipidemia, lumbar fusion L4-5 2016 and L2-3 2017, cervical fusion 2001. Per chart review patient lives with spouse. One level home 1 step to entry. She had been using a scooter for household and community ambulation secondary to bilateral lower extremity weakness for the past few weeks prior to she had used a rolling walker for ambulation. Husband works during the day as well as a local daughter who also works, but husband states he can provide  Nurse, mental health.Presented 12/18/2016 with long-standing history of neck and back pain and progressive paraparesis. She denied any  bowel or bladder dysfunction. 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 Underwent left T10 costotransversectomy, T10-11 laminectomy for decompression, dorsal internal fixation and fusion with bilateral pedicle screws T9-T12 12/19/2016 per Dr. Cyndy Freeze. Hospital course pain management.Decadron protocol as indicated with taper.TLSO back brace applied in sitting position.Acute blood loss anemia 10.4 and monitored.   Now requiring 24/7 Rehab RN,MD, as well as CIR level PT, OT and SLP.  Treatment team will focus on ADLs and mobility with goals set at  [place] [facility].  See Team Conference Notes for weekly updates to the plan of care

## 2016-12-24 NOTE — Progress Notes (Signed)
Physical Therapy Session Note  Patient Details  Name: Megan [name] MRN: [id number] Date of Birth: 08/14/1951  Today's Date: 12/24/2016 PT Individual Time: 1015-1130 PT Individual Time Calculation (min): 75 min   Short Term Goals: Week 1:  PT Short Term Goal 1 (Week 1): Pt to perfrorm bed<>w/c transfers with min assist PT Short Term Goal 2 (Week 1): Pt to tolerate static standing X10 minutes with LRAD PT Short Term Goal 3 (Week 1): Pt to perform static sitting balance X 10 minutes.  PT Short Term Goal 4 (Week 1): Pt to perform supine<>sitting with supervision using logroll technique.  PT Short Term Goal 5 (Week 1): Pt to don/doff TLSO with supervision in sitting.   Skilled Therapeutic Interventions/Progress Updates:    no c/o pain.  Session focus on functional mobility, activity tolerance, balance, and NMR.    Pt transitions supine>sit via log roll to L with bed rails and min assist.  Sit<>stand and w/c transfer with stedy, pt cued to push up from bed with LUE, mod assist to rise.  Pt propels w/c to and from therapy gym mod I for mobility, Ue strengthening, and cardiopulmonary endurance.  Attempted trial with Lite Gait, but machine not fully charged.  Pt able to perform sit<>stand x3 from w/c with steady assist and RW to don lite gait harness and to prepare to step up on to treadmill.  Transition back to w/c and gait training x15' with EVA walker and min assist for advancing RLE.  Pt able to unload RLE, initiate advancing with hip/knee flexion, and stabilize in stance phase but requires assist for swing through 2/2 weakness.  Attempted 2nd gait trial but pt too fatigued.  Nustep x6 minutes at level 2 with BLEs for strengthening, NMR, and reciprocal stepping pattern retraining.  Pt returned to room at end of session and positioned in w/c with call bell in reach and needs met.  Therapy Documentation Precautions:  Precautions Precautions: Back Precaution Booklet Issued: No Precaution  Comments: reviewed back precautions with pt  Required Braces or Orthoses: Spinal Brace Spinal Brace: Thoracolumbosacral orthotic, Applied in sitting position Restrictions Weight Bearing Restrictions: No   See Function Navigator for Current Functional Status.   Therapy/Group: Individual Therapy  Coury Grieger E Penven-Crew 12/24/2016, 11:56 AM

## 2016-12-24 NOTE — Progress Notes (Addendum)
Occupational Therapy Session Note  Patient Details  Name: Megan [name] MRN: [id number] Date of Birth: Nov 13, 1950  Today's Date: 12/24/2016  Session 1 OT Individual Time: 0800-0900 OT Individual Time Calculation (min): 60 min  Session 2 OT Individual Time: 8144-8185 OT Individual Time Calculation (min): 58 min    Short Term Goals: Week 1:  OT Short Term Goal 1 (Week 1): Pt will perform toilet transfer with mod A in order to decrease level of assistance. OT Short Term Goal 2 (Week 1): Pt will maintain standing balance for 2 minutes during LB clothing management and hygiene with min A. OT Short Term Goal 3 (Week 1): Pt will perform LB dressing with mod A in order to decrease level of assistance. OT Short Term Goal 4 (Week 1): Pt will engage in 10 minutes of functional task with 2 rest breaks or less.   Skilled Therapeutic Interventions/Progress Updates:    Session 1 OT treatment session focused on modified bathing/dressing strategies using ADL AE and improved sit<>stand. Pt reports 9/10 burning pain, RN administered pain meds with improved pain management. Min set-up A to don TLSO seated EOB with close supervision for sitting balance. Demonstrated use of sock-aid to pt with assistance to place and pull up sock-aid 2/2 pain. Stedy used to transfer to wc with min A sit<>stand. Bathing/dressing completed with overall Min A for UB dressing and TLSO placement and Max A for LB dressing. Mod A sit<>stand at the sink with R knee block. Pt able to remove unilateral UE support to assist with pulling up pants. Pt returned to bed at end of session min A w/ Stedy 2/2  Transport arrival to take pt for imaging.    Session 2 OT treatment session focused on LB there-ex, scooting, and transfer training. B UE coordination and there-ex to propel w/c to therapy gym with supervision. Lateral scooting transfer >L with min A. Addressed weight shifting and lateral scoot L and R along therapy mat. Pt required  assistance to advance R LE with scoot 2/2 weak hip flexion. Yellow therea-band used for R LE there-ex to assist R hip through reciprocal ROM seated on raised mat. Thera-band then used around knees for active assisted hp ab/adduction 10x2. Pt transferred back to wc in similar fashion and brought to tub room. Pt explained home bathroom set-up and homemade tub bench pt's husband created from 2 shower seats and a board. Demonstrated tub bench transfer and pt able to manage UB to laterally scoot onto bench. Then required assistance to raise R LE in and out of tub. Pt propelled wc back to room and Stedy used to transfer pt onto toilet with min A. Discussed progressing to scooting transfer onto drop-arm commode and pt agreable. Pt left seated on toilet with needs met and RN notified of pt status.   Therapy Documentation Precautions:  Precautions Precautions: Back Precaution Booklet Issued: No Precaution Comments: reviewed back precautions with pt  Required Braces or Orthoses: Spinal Brace Spinal Brace: Thoracolumbosacral orthotic, Applied in sitting position Restrictions Weight Bearing Restrictions: No Pain: Pain Assessment Pain Assessment: 0-10 Pain Score: 9  Pain Type: Acute pain Pain Location: Back Pain Orientation: Posterior Pain Descriptors / Indicators: Aching;Burning Pain Frequency: Intermittent Pain Onset: On-going Pain Intervention(s): RN administered pain meds See Function Navigator for Current Functional Status.   Therapy/Group: Individual Therapy  [name] 12/24/2016, 4:43 PM

## 2016-12-24 NOTE — Progress Notes (Signed)
*  PRELIMINARY RESULTS* Vascular Ultrasound Lower extremity venous duplex has been completed.  Preliminary findings: No evidence of DVT or baker's cyst.  Landry Mellow, RDMS, RVT  12/24/2016, 9:34 AM

## 2016-12-24 NOTE — Plan of Care (Signed)
Problem: SCI BLADDER ELIMINATION Goal: RH STG MANAGE BLADDER WITH ASSISTANCE STG Manage Bladder With Assistance  Outcome: Not Progressing Requires I & O cath

## 2016-12-24 NOTE — Progress Notes (Signed)
Subjective/Complaints:    ICP last night, states she voided x 2 yest but didn't know residual  ROS-  Denies CP, SOB N/V/D no pain in LE, feels numb in legs  Objective: Vital Signs: Blood pressure 132/68, pulse 63, temperature 98 F (36.7 C), temperature source Oral, resp. rate 16, weight 78.2 kg (172 lb 6.4 oz), SpO2 99 %. No results found. Results for orders placed or performed during the hospital encounter of 12/21/16 (from the past 72 hour(s))  Glucose, capillary     Status: Abnormal   Collection Time: 12/21/16  9:18 PM  Result Value Ref Range   Glucose-Capillary 158 (H) 65 - 99 mg/dL  CBC WITH DIFFERENTIAL     Status: Abnormal   Collection Time: 12/22/16  4:36 AM  Result Value Ref Range   WBC 12.2 (H) 4.0 - 10.5 K/uL   RBC 4.01 3.87 - 5.11 MIL/uL   Hemoglobin 12.0 12.0 - 15.0 g/dL   HCT 35.6 (L) 36.0 - 46.0 %   MCV 88.8 78.0 - 100.0 fL   MCH 29.9 26.0 - 34.0 pg   MCHC 33.7 30.0 - 36.0 g/dL   RDW 13.7 11.5 - 15.5 %   Platelets 188 150 - 400 K/uL   Neutrophils Relative % 83 %   Neutro Abs 10.1 (H) 1.7 - 7.7 K/uL   Lymphocytes Relative 11 %   Lymphs Abs 1.4 0.7 - 4.0 K/uL   Monocytes Relative 6 %   Monocytes Absolute 0.7 0.1 - 1.0 K/uL   Eosinophils Relative 0 %   Eosinophils Absolute 0.0 0.0 - 0.7 K/uL   Basophils Relative 0 %   Basophils Absolute 0.0 0.0 - 0.1 K/uL  Comprehensive metabolic panel     Status: Abnormal   Collection Time: 12/22/16  4:36 AM  Result Value Ref Range   Sodium 135 135 - 145 mmol/L   Potassium 4.0 3.5 - 5.1 mmol/L   Chloride 100 (L) 101 - 111 mmol/L   CO2 26 22 - 32 mmol/L   Glucose, Bld 156 (H) 65 - 99 mg/dL   BUN 14 6 - 20 mg/dL   Creatinine, Ser 0.77 0.44 - 1.00 mg/dL   Calcium 8.6 (L) 8.9 - 10.3 mg/dL   Total Protein 6.2 (L) 6.5 - 8.1 g/dL   Albumin 3.4 (L) 3.5 - 5.0 g/dL   AST 17 15 - 41 U/L   ALT 22 14 - 54 U/L   Alkaline Phosphatase 66 38 - 126 U/L   Total Bilirubin 0.6 0.3 - 1.2 mg/dL   GFR calc non Af Amer >60 >60 mL/min    GFR calc Af Amer >60 >60 mL/min    Comment: (NOTE) The eGFR has been calculated using the CKD EPI equation. This calculation has not been validated in all clinical situations. eGFR's persistently <60 mL/min signify possible Chronic Kidney Disease.    Anion gap 9 5 - 15  Glucose, capillary     Status: Abnormal   Collection Time: 12/22/16  7:08 AM  Result Value Ref Range   Glucose-Capillary 196 (H) 65 - 99 mg/dL  Glucose, capillary     Status: Abnormal   Collection Time: 12/22/16 11:43 AM  Result Value Ref Range   Glucose-Capillary 217 (H) 65 - 99 mg/dL   Comment 1 Notify RN   Glucose, capillary     Status: Abnormal   Collection Time: 12/22/16  5:14 PM  Result Value Ref Range   Glucose-Capillary 129 (H) 65 - 99 mg/dL   Comment 1 Notify RN  Glucose, capillary     Status: Abnormal   Collection Time: 12/22/16 10:10 PM  Result Value Ref Range   Glucose-Capillary 163 (H) 65 - 99 mg/dL  Glucose, capillary     Status: None   Collection Time: 12/23/16  6:41 AM  Result Value Ref Range   Glucose-Capillary 91 65 - 99 mg/dL  Glucose, capillary     Status: Abnormal   Collection Time: 12/23/16 12:23 PM  Result Value Ref Range   Glucose-Capillary 160 (H) 65 - 99 mg/dL   Comment 1 Notify RN   Glucose, capillary     Status: Abnormal   Collection Time: 12/23/16  4:54 PM  Result Value Ref Range   Glucose-Capillary 138 (H) 65 - 99 mg/dL   Comment 1 Notify RN   Glucose, capillary     Status: Abnormal   Collection Time: 12/23/16  9:59 PM  Result Value Ref Range   Glucose-Capillary 142 (H) 65 - 99 mg/dL  Glucose, capillary     Status: None   Collection Time: 12/24/16  6:23 AM  Result Value Ref Range   Glucose-Capillary 81 65 - 99 mg/dL     HEENT: normal Cardio: RRR and no murmur Resp: CTA B/L and unlabored GI: BS positive and NT, ND Extremity:  No Edema Skin:   Honeycomb dressing, dried blood along incision, but no erythema, no fluctuance, drain site well healed Neuro:  Alert/Oriented, Abnormal Sensory Normal sensation in BUE, absent right L5 sensation to LT as well as right L3 and Abnormal Motor 5/5 in BUE, 3- Right knee ext ,0/5 Right ankle DF, LLE 3/5 KE 3- ADF, 3 L Hip ext Musc/Skel:  Other no pain with UE or LE ROM, full AROM in BUE Gen NAD   Assessment/Plan: 1. 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 Physiatrist is providing close team supervision and 24 hour management of active medical problems listed below. Physiatrist and rehab team continue to assess barriers to discharge/monitor patient progress toward functional and medical goals. FIM: Function - Bathing Position: Wheelchair/chair at sink Body parts bathed by patient: Right arm, Left arm, Chest, Abdomen, Front perineal area, Right upper leg, Left upper leg Body parts bathed by helper: Buttocks, Right lower leg, Left lower leg, Back Assist Level:  (mod A)  Function- Upper Body Dressing/Undressing What is the patient wearing?: Orthosis, Bra, Pull over shirt/dress Bra - Perfomed by helper: Thread/unthread right bra strap, Thread/unthread left bra strap, Hook/unhook bra (pull down sports bra) Pull over shirt/dress - Perfomed by helper: Thread/unthread right sleeve, Thread/unthread left sleeve, Put head through opening, Pull shirt over trunk Orthosis activity level: Performed by helper Assist Level: Set up Set up : To obtain clothing/put away, To apply TLSO, cervical collar Function - Lower Body Dressing/Undressing What is the patient wearing?: Underwear, Socks, Shoes Position: Wheelchair/chair at sink Underwear - Performed by helper: Thread/unthread right underwear leg, Thread/unthread left underwear leg, Pull underwear up/down Pants- Performed by helper: Thread/unthread right pants leg, Thread/unthread left pants leg, Pull pants up/down Non-skid slipper socks- Performed by helper:  Don/doff right sock, Don/doff left sock Socks - Performed by helper: Don/doff right sock, Don/doff left sock Shoes - Performed by helper: Don/doff right shoe, Don/doff left shoe, Fasten right, Fasten left Assist for footwear: Dependant  Function - Toileting Toileting activity did not occur: No continent bowel/bladder event Toileting steps completed by helper: Performs perineal hygiene, Adjust clothing prior to toileting, Adjust clothing after toileting  Function - Air cabin crew transfer activity did not occur: Safety/medical concerns  Function - Chair/bed transfer Chair/bed transfer method: Squat pivot Chair/bed transfer assist level: Touching or steadying assistance (Pt > 75%) Chair/bed transfer assistive device: Sliding board Mechanical lift: Stedy Chair/bed transfer details: Manual facilitation for weight shifting, Manual facilitation for placement, Verbal cues for safe use of DME/AE, Verbal cues for technique  Function - Locomotion: Wheelchair Will patient use wheelchair at discharge?: Yes Type: Manual Max wheelchair distance: 158f Assist Level: Supervision or verbal cues Assist Level: Supervision or verbal cues Assist Level: Supervision or verbal cues Turns around,maneuvers to table,bed, and toilet,negotiates 3% grade,maneuvers on rugs and over doorsills: No Function - Locomotion: Ambulation Ambulation activity did not occur: Safety/medical concerns Assistive device: Parallel bars Max distance: 5 Walk 10 feet activity did not occur: Safety/medical concerns Walk 50 feet with 2 turns activity did not occur: Safety/medical concerns Walk 150 feet activity did not occur: Safety/medical concerns Walk 10 feet on uneven surfaces activity did not occur: Safety/medical concerns  Function - Comprehension Comprehension: Auditory Comprehension assist level: Follows complex conversation/direction with extra time/assistive device  Function - Expression Expression:  Verbal Expression assist level: Expresses complex ideas: With no assist  Function - Social Interaction Social Interaction assist level: Interacts appropriately with others with medication or extra time (anti-anxiety, antidepressant).  Function - Problem Solving Problem solving assist level: Solves complex problems: With extra time  Function - Memory Memory assist level: Complete Independence: No helper Patient normally able to recall (first 3 days only): Current season, Location of own room, Staff names and faces, That he or she is in a hospital  Medical Problem List and Plan: 1. 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 Back brace when out of bed Continue CIR PT, OT 2. DVT Prophylaxis/Anticoagulation: SCDs. Check vascular study 3. Pain Management:OxyContin sustained release 20 mg every 12 hours Celebrex 200 mg every every 12 hours, Neurontin 300 mg 3 times a day, Robaxin 750 mg 4 times a day, Hydrocodone as needed 4. Mood: Provide emotional support 5. Neuropsych: This patient iscapable of making decisions on herown behalf. 6. Skin/Wound Care: Routine skin checks 7. Fluids/Electrolytes/Nutrition: Routine I&O with follow-up chemistries 8.Diabetes mellitus. Glucophage 500 mg twice a day. Check blood sugars before meals and at bedtime. Monitor closely while on Decadron taper CBG (last 3)   Recent Labs  12/23/16 1654 12/23/16 2159 12/24/16 0623  GLUCAP 138* 142* 81    9.Hypertension. Lisinopril 10 mg daily. Vitals:   12/23/16 1458 12/24/16 0509  BP: (!) 108/54 132/68  Pulse: 70 63  Resp: 16 16  Temp: 98.3 F (36.8 C) 98 F (36.7 C)   10.Hyperlipidemia. Lipitor 11.Neurogenic bladder.ICP 12.Constipation. Neurogenic bowel, establish bowel program with Dulcolax, BM yest pm 13.GERD. Protonix  LOS (Days) 3 A FACE TO FACE EVALUATION WAS  PERFORMED  Karyna Bessler E 12/24/2016, 6:47 AM

## 2016-12-25 ENCOUNTER — Inpatient Hospital Stay (HOSPITAL_COMMUNITY): Payer: BLUE CROSS/BLUE SHIELD | Admitting: Physical Therapy

## 2016-12-25 DIAGNOSIS — E1165 Type 2 diabetes mellitus with hyperglycemia: Secondary | ICD-10-CM

## 2016-12-25 LAB — GLUCOSE, CAPILLARY
GLUCOSE-CAPILLARY: 134 mg/dL — AB (ref 65–99)
GLUCOSE-CAPILLARY: 209 mg/dL — AB (ref 65–99)
GLUCOSE-CAPILLARY: 115 mg/dL — AB (ref 65–99)
Glucose-Capillary: 98 mg/dL (ref 65–99)

## 2016-12-25 MED ORDER — BETHANECHOL CHLORIDE 10 MG PO TABS
10.0000 mg | ORAL_TABLET | Freq: Three times a day (TID) | ORAL | Status: DC
  Administered 2016-12-25 – 2016-12-28 (×10): 10 mg via ORAL
  Filled 2016-12-25 (×10): qty 1

## 2016-12-25 NOTE — Progress Notes (Signed)
Subjective/Complaints:  Had to be cathed last night. Pain controlled. Prefers to have therapy at 0800 or after.   ROS: pt denies nausea, vomiting, diarrhea, cough, shortness of breath or chest pain   Objective: Vital Signs: Blood pressure 140/69, pulse 83, temperature 98.3 F (36.8 C), temperature source Oral, resp. rate 19, weight 78.2 kg (172 lb 6.4 oz), SpO2 99 %. No results found. Results for orders placed or performed during the hospital encounter of 12/21/16 (from the past 72 hour(s))  Glucose, capillary     Status: Abnormal   Collection Time: 12/22/16 11:43 AM  Result Value Ref Range   Glucose-Capillary 217 (H) 65 - 99 mg/dL   Comment 1 Notify RN   Glucose, capillary     Status: Abnormal   Collection Time: 12/22/16  5:14 PM  Result Value Ref Range   Glucose-Capillary 129 (H) 65 - 99 mg/dL   Comment 1 Notify RN   Glucose, capillary     Status: Abnormal   Collection Time: 12/22/16 10:10 PM  Result Value Ref Range   Glucose-Capillary 163 (H) 65 - 99 mg/dL  Glucose, capillary     Status: None   Collection Time: 12/23/16  6:41 AM  Result Value Ref Range   Glucose-Capillary 91 65 - 99 mg/dL  Glucose, capillary     Status: Abnormal   Collection Time: 12/23/16 12:23 PM  Result Value Ref Range   Glucose-Capillary 160 (H) 65 - 99 mg/dL   Comment 1 Notify RN   Glucose, capillary     Status: Abnormal   Collection Time: 12/23/16  4:54 PM  Result Value Ref Range   Glucose-Capillary 138 (H) 65 - 99 mg/dL   Comment 1 Notify RN   Glucose, capillary     Status: Abnormal   Collection Time: 12/23/16  9:59 PM  Result Value Ref Range   Glucose-Capillary 142 (H) 65 - 99 mg/dL  Glucose, capillary     Status: None   Collection Time: 12/24/16  6:23 AM  Result Value Ref Range   Glucose-Capillary 81 65 - 99 mg/dL  Glucose, capillary     Status: Abnormal   Collection Time: 12/24/16 11:41 AM  Result Value Ref Range   Glucose-Capillary 178 (H) 65 - 99 mg/dL   Comment 1 Notify RN    Glucose, capillary     Status: Abnormal   Collection Time: 12/24/16  4:23 PM  Result Value Ref Range   Glucose-Capillary 199 (H) 65 - 99 mg/dL  Glucose, capillary     Status: Abnormal   Collection Time: 12/24/16  9:01 PM  Result Value Ref Range   Glucose-Capillary 151 (H) 65 - 99 mg/dL  Glucose, capillary     Status: None   Collection Time: 12/25/16  6:29 AM  Result Value Ref Range   Glucose-Capillary 98 65 - 99 mg/dL     HEENT: normal Cardio: RRR Resp: CTA B/L and unlabored GI: BS positive and NT, ND Extremity:  No Edema Skin:   Honeycomb dressing, wound dry Neuro: Alert/Oriented, Abnormal Sensory Normal sensation in BUE, absent right L5 sensation to LT as well as right L3 and Abnormal Motor 5/5 in BUE, 3- Right knee ext ,0/5 Right ankle DF, LLE 3/5 KE 3- ADF, 3 L Hip ext--stable exam Musc/Skel:  Other no pain with UE or LE ROM, full AROM in BUE Gen NAD   Assessment/Plan: 1. 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 Physiatrist is  providing close team supervision and 24 hour management of active medical problems listed below. Physiatrist and rehab team continue to assess barriers to discharge/monitor patient progress toward functional and medical goals. FIM: Function - Bathing Position: Wheelchair/chair at sink Body parts bathed by patient: Right arm, Left arm, Chest, Abdomen, Front perineal area, Right upper leg, Left upper leg Body parts bathed by helper: Buttocks, Right lower leg, Left lower leg, Back Assist Level:  (mod A)  Function- Upper Body Dressing/Undressing What is the patient wearing?: Orthosis, Bra, Pull over shirt/dress Bra - Perfomed by helper: Thread/unthread right bra strap, Thread/unthread left bra strap, Hook/unhook bra (pull down sports bra) Pull over shirt/dress - Perfomed by helper: Thread/unthread right sleeve, Thread/unthread left sleeve, Put  head through opening, Pull shirt over trunk Orthosis activity level: Performed by helper Assist Level: Set up Set up : To obtain clothing/put away, To apply TLSO, cervical collar Function - Lower Body Dressing/Undressing What is the patient wearing?: Underwear, Socks, Shoes Position: Wheelchair/chair at sink Underwear - Performed by helper: Thread/unthread right underwear leg, Thread/unthread left underwear leg, Pull underwear up/down Pants- Performed by helper: Thread/unthread right pants leg, Thread/unthread left pants leg, Pull pants up/down Non-skid slipper socks- Performed by helper: Don/doff right sock, Don/doff left sock Socks - Performed by helper: Don/doff right sock, Don/doff left sock Shoes - Performed by helper: Don/doff right shoe, Don/doff left shoe, Fasten right, Fasten left Assist for footwear: Dependant  Function - Toileting Toileting activity did not occur: No continent bowel/bladder event Toileting steps completed by helper: Adjust clothing prior to toileting, Performs perineal hygiene, Adjust clothing after toileting Assist level: Two helpers  Function - Air cabin crew transfer activity did not occur: Safety/medical concerns Toilet transfer assistive device: Elevated toilet seat/BSC over toilet, Grab bar Assist level to toilet: 2 helpers Assist level from toilet: 2 helpers  Function - Chair/bed transfer Chair/bed transfer method: Other (Steady) Chair/bed transfer assist level: Total assist (Pt < 25%) Chair/bed transfer assistive device: Sliding board Mechanical lift: Stedy Chair/bed transfer details: Manual facilitation for weight shifting, Manual facilitation for placement, Verbal cues for safe use of DME/AE, Verbal cues for technique  Function - Locomotion: Wheelchair Will patient use wheelchair at discharge?: Yes Type: Manual Max wheelchair distance: 18f Assist Level: Supervision or verbal cues Assist Level: Supervision or verbal cues Assist  Level: Supervision or verbal cues Turns around,maneuvers to table,bed, and toilet,negotiates 3% grade,maneuvers on rugs and over doorsills: No Function - Locomotion: Ambulation Ambulation activity did not occur: Safety/medical concerns Assistive device: Parallel bars Max distance: 5 Walk 10 feet activity did not occur: Safety/medical concerns Walk 50 feet with 2 turns activity did not occur: Safety/medical concerns Walk 150 feet activity did not occur: Safety/medical concerns Walk 10 feet on uneven surfaces activity did not occur: Safety/medical concerns  Function - Comprehension Comprehension: Auditory Comprehension assist level: Follows complex conversation/direction with extra time/assistive device  Function - Expression Expression: Verbal Expression assist level: Expresses complex ideas: With no assist  Function - Social Interaction Social Interaction assist level: Interacts appropriately with others with medication or extra time (anti-anxiety, antidepressant).  Function - Problem Solving Problem solving assist level: Solves complex problems: With extra time  Function - Memory Memory assist level: Recognizes or recalls 90% of the time/requires cueing < 10% of the time Patient normally able to recall (first 3 days only): Current season, Location of own room, Staff names and faces, That he or she is in a hospital  Medical Problem List and Plan: 1. Progressive paraparesissecondary to  thoracic myelopathy status post T10 costotransversectomy, T10-11 laminectomy decompression, bilateral pedicle screws T9-T12 12/19/2016. Back brace when out of bed Continue CIR PT, OT 2. DVT Prophylaxis/Anticoagulation: SCDs. Dopplers negative 3. Pain Management:OxyContin sustained release 20 mg every 12 hours Celebrex 200 mg every every 12 hours, Neurontin 300 mg 3 times a day, Robaxin 750 mg 4 times a day, Hydrocodone as needed 4. Mood: Provide emotional support 5. Neuropsych: This patient  iscapable of making decisions on herown behalf. 6. Skin/Wound Care: Routine skin checks 7. Fluids/Electrolytes/Nutrition: encourage PO 8.Diabetes mellitus. Glucophage 500 mg twice a day. Check blood sugars before meals and at bedtime.   -cover with SSI while tapering steroids, sugars higher in PM CBG (last 3)   Recent Labs  12/24/16 1623 12/24/16 2101 12/25/16 0629  GLUCAP 199* 151* 98    9.Hypertension. Lisinopril 10 mg daily. Vitals:   12/24/16 1450 12/25/16 0500  BP: (!) 139/59 140/69  Pulse: 82 83  Resp: 18 19  Temp: 97.4 F (36.3 C) 98.3 F (36.8 C)   10.Hyperlipidemia. Lipitor 11.Neurogenic bladder. ICP  -initiate urecholine trial  -check ua/ucx 12.Constipation. Neurogenic bowel, establish bowel program with Dulcolax, BM 4/12 13.GERD. Protonix  LOS (Days) 4 A FACE TO FACE EVALUATION WAS PERFORMED  SWARTZ,ZACHARY T 12/25/2016, 8:15 AM

## 2016-12-26 ENCOUNTER — Inpatient Hospital Stay (HOSPITAL_COMMUNITY): Payer: BLUE CROSS/BLUE SHIELD | Admitting: Physical Therapy

## 2016-12-26 ENCOUNTER — Inpatient Hospital Stay (HOSPITAL_COMMUNITY): Payer: BLUE CROSS/BLUE SHIELD

## 2016-12-26 ENCOUNTER — Inpatient Hospital Stay (HOSPITAL_COMMUNITY): Payer: BLUE CROSS/BLUE SHIELD | Admitting: Occupational Therapy

## 2016-12-26 LAB — GLUCOSE, CAPILLARY
Glucose-Capillary: 106 mg/dL — ABNORMAL HIGH (ref 65–99)
Glucose-Capillary: 163 mg/dL — ABNORMAL HIGH (ref 65–99)
Glucose-Capillary: 215 mg/dL — ABNORMAL HIGH (ref 65–99)
Glucose-Capillary: 109 mg/dL — ABNORMAL HIGH (ref 65–99)

## 2016-12-26 LAB — URINALYSIS, ROUTINE W REFLEX MICROSCOPIC
BILIRUBIN URINE: NEGATIVE
GLUCOSE, UA: NEGATIVE mg/dL
HGB URINE DIPSTICK: NEGATIVE
KETONES UR: NEGATIVE mg/dL
LEUKOCYTES UA: NEGATIVE
NITRITE: NEGATIVE
PH: 6 (ref 5.0–8.0)
Protein, ur: NEGATIVE mg/dL
Specific Gravity, Urine: 1.009 (ref 1.005–1.030)

## 2016-12-26 MED ORDER — SENNOSIDES-DOCUSATE SODIUM 8.6-50 MG PO TABS
2.0000 | ORAL_TABLET | Freq: Every day | ORAL | Status: DC
  Administered 2016-12-26: 2 via ORAL
  Filled 2016-12-26: qty 2

## 2016-12-26 MED ORDER — FLEET ENEMA 7-19 GM/118ML RE ENEM
1.0000 | ENEMA | Freq: Once | RECTAL | Status: AC
  Administered 2016-12-27: 1 via RECTAL
  Filled 2016-12-26: qty 1

## 2016-12-26 MED ORDER — SCOPOLAMINE 1 MG/3DAYS TD PT72
1.0000 | MEDICATED_PATCH | TRANSDERMAL | Status: DC
  Administered 2016-12-26 – 2017-01-01 (×3): 1.5 mg via TRANSDERMAL
  Filled 2016-12-26 (×4): qty 1

## 2016-12-26 MED ORDER — DEXAMETHASONE 4 MG PO TABS
6.0000 mg | ORAL_TABLET | Freq: Every day | ORAL | Status: DC
  Administered 2016-12-27 – 2016-12-30 (×4): 6 mg via ORAL
  Filled 2016-12-26 (×4): qty 1

## 2016-12-26 MED ORDER — SORBITOL 70 % SOLN
60.0000 mL | Status: AC
  Administered 2016-12-26: 60 mL via ORAL
  Filled 2016-12-26: qty 60

## 2016-12-26 NOTE — Progress Notes (Signed)
Occupational Therapy Session Note  Patient Details  Name: Megan [name] MRN: [id number] Date of Birth: 11-02-1950  Today's Date: 12/26/2016 OT Individual Time: 0902-0958 OT Individual Time Calculation (min): 56 min    Skilled Therapeutic Interventions/Progress Updates: Skilled OT session completed with focus on functional transfers, standing endurance, and adherence to back precautions. Pt was sitting up in w/c at time of arrival with TLSO, reporting need to void. Sit<stand in [place] completed with Min A and pt escorted to toilet. Pt successful with bladder elimination. She completed hygiene while standing in Stedy, min cues for not breaking 90 degree hip flexion. Afterwards, she self propelled to dayroom. Had her engage in bilateral UE home management task (folding laundry) for LE strengthening and balance. Pt able to stand for 2 minute intervals before sitting down due to nausea. Pt believes this is medication related, sees it as a barrier to therapy participation. We discussed collaborating with RN to find solution. RN notified to speak with pt about this. Pt self propelled back to room and was left with all needs within reach at time of departure.       Therapy Documentation Precautions:  Precautions Precautions: Back Precaution Booklet Issued: No Precaution Comments: reviewed back precautions with pt  Required Braces or Orthoses: Spinal Brace Spinal Brace: Thoracolumbosacral orthotic, Applied in sitting position Restrictions Weight Bearing Restrictions: No   Pain: Min pain in back when standing, subsided with rest per pt report   ADL:  :    See Function Navigator for Current Functional Status.   Therapy/Group: Individual Therapy  Evangelyn Crouse A Marcene Laskowski 12/26/2016, 12:34 PM

## 2016-12-26 NOTE — Progress Notes (Signed)
Occupational Therapy Session Note  Patient Details  Name: Megan [name] MRN: [id number] Date of Birth: 18-Aug-1951  Today's Date: 12/26/2016 OT Missed Time:   30 min  Missed Time Reason:   Pt missed 30 min skilled OT services d/t nausea. Communicated with RN who is recommending not participating in therapy d/t increased nausea with head movement. Will follow up as point available.  Therapy Documentation Precautions:  Precautions Precautions: Back Precaution Booklet Issued: No Precaution Comments: reviewed back precautions with pt  Required Braces or Orthoses: Spinal Brace Spinal Brace: Thoracolumbosacral orthotic, Applied in sitting position Restrictions Weight Bearing Restrictions: No  See Function Navigator for Current Functional Status.   Therapy/Group: Individual Therapy  Tonny Branch 12/26/2016, 10:41 AM

## 2016-12-26 NOTE — Progress Notes (Signed)
Occupational Therapy Session Note  Patient Details  Name: Megan [name] MRN: [id number] Date of Birth: 11/01/1950  Today's Date: 12/26/2016 OT Individual Time: 0700-0758 OT Individual Time Calculation (min): 58 min    Short Term Goals: Week 1:  OT Short Term Goal 1 (Week 1): Pt will perform toilet transfer with mod A in order to decrease level of assistance. OT Short Term Goal 2 (Week 1): Pt will maintain standing balance for 2 minutes during LB clothing management and hygiene with min A. OT Short Term Goal 3 (Week 1): Pt will perform LB dressing with mod A in order to decrease level of assistance. OT Short Term Goal 4 (Week 1): Pt will engage in 10 minutes of functional task with 2 rest breaks or less.   Skilled Therapeutic Interventions/Progress Updates:    1:1. Pain reported in back  At 3/10, but RN just administered medication. Pt agreeable to bathe/dress session. Pt sits EOB with close supervision to wash UB/LB. OT educated pt on use of long handled sponge to wash feet and back. After washing LB, pt dons bra with A to fasten and pull over shirt/brace with set up/supervision. Pt completes sit to stand in steady with CGA and washes peri area/buttocks in standing. Pt transfers to w/c in steady to don underwear, pants ted hose and socks. Pt uses reacher to thread BLE into underwear and pants with VC from OT to extend RLE into pant leg. OT don TED hose. Pt able to use sock aide to don B non slip socks with Vc for technique. At sink pt brushes teeth, and OT washes hair with shampoo. Pt participates in lathering hair. Pt combs hair and reports feeling much better after having hair shampooed. Exited session with pt seated in w/c with call light in reach and all needs met.   Therapy Documentation Precautions:  Precautions Precautions: Back Precaution Booklet Issued: No Precaution Comments: reviewed back precautions with pt  Required Braces or Orthoses: Spinal Brace Spinal Brace:  Thoracolumbosacral orthotic, Applied in sitting position Restrictions Weight Bearing Restrictions: No General:    See Function Navigator for Current Functional Status.   Therapy/Group: Individual Therapy  Tonny Branch 12/26/2016, 10:18 AM

## 2016-12-26 NOTE — Progress Notes (Signed)
Subjective/Complaints:  No problems over night. Bladder emptying better. No caths since 1420 yesterday.   ROS: pt denies nausea, vomiting, diarrhea, cough, shortness of breath or chest pain   Objective: Vital Signs: Blood pressure 136/62, pulse 60, temperature 97.8 F (36.6 C), temperature source Oral, resp. rate 18, weight 78.2 kg (172 lb 6.4 oz), SpO2 100 %. No results found. Results for orders placed or performed during the hospital encounter of 12/21/16 (from the past 72 hour(s))  Glucose, capillary     Status: Abnormal   Collection Time: 12/23/16 12:23 PM  Result Value Ref Range   Glucose-Capillary 160 (H) 65 - 99 mg/dL   Comment 1 Notify RN   Glucose, capillary     Status: Abnormal   Collection Time: 12/23/16  4:54 PM  Result Value Ref Range   Glucose-Capillary 138 (H) 65 - 99 mg/dL   Comment 1 Notify RN   Glucose, capillary     Status: Abnormal   Collection Time: 12/23/16  9:59 PM  Result Value Ref Range   Glucose-Capillary 142 (H) 65 - 99 mg/dL  Glucose, capillary     Status: None   Collection Time: 12/24/16  6:23 AM  Result Value Ref Range   Glucose-Capillary 81 65 - 99 mg/dL  Glucose, capillary     Status: Abnormal   Collection Time: 12/24/16 11:41 AM  Result Value Ref Range   Glucose-Capillary 178 (H) 65 - 99 mg/dL   Comment 1 Notify RN   Glucose, capillary     Status: Abnormal   Collection Time: 12/24/16  4:23 PM  Result Value Ref Range   Glucose-Capillary 199 (H) 65 - 99 mg/dL  Glucose, capillary     Status: Abnormal   Collection Time: 12/24/16  9:01 PM  Result Value Ref Range   Glucose-Capillary 151 (H) 65 - 99 mg/dL  Urinalysis, Routine w reflex microscopic     Status: Abnormal   Collection Time: 12/25/16  1:05 AM  Result Value Ref Range   Color, Urine YELLOW YELLOW   APPearance HAZY (A) CLEAR   Specific Gravity, Urine 1.009 1.005 - 1.030   pH 6.0 5.0 - 8.0   Glucose, UA NEGATIVE NEGATIVE mg/dL   Hgb urine dipstick NEGATIVE NEGATIVE   Bilirubin  Urine NEGATIVE NEGATIVE   Ketones, ur NEGATIVE NEGATIVE mg/dL   Protein, ur NEGATIVE NEGATIVE mg/dL   Nitrite NEGATIVE NEGATIVE   Leukocytes, UA NEGATIVE NEGATIVE   RBC / HPF 0-5 0 - 5 RBC/hpf   WBC, UA 0-5 0 - 5 WBC/hpf   Bacteria, UA MANY (A) NONE SEEN   Squamous Epithelial / LPF 0-5 (A) NONE SEEN  Glucose, capillary     Status: None   Collection Time: 12/25/16  6:29 AM  Result Value Ref Range   Glucose-Capillary 98 65 - 99 mg/dL  Glucose, capillary     Status: Abnormal   Collection Time: 12/25/16 11:43 AM  Result Value Ref Range   Glucose-Capillary 134 (H) 65 - 99 mg/dL  Glucose, capillary     Status: Abnormal   Collection Time: 12/25/16  5:06 PM  Result Value Ref Range   Glucose-Capillary 209 (H) 65 - 99 mg/dL  Glucose, capillary     Status: Abnormal   Collection Time: 12/25/16  9:11 PM  Result Value Ref Range   Glucose-Capillary 115 (H) 65 - 99 mg/dL  Glucose, capillary     Status: Abnormal   Collection Time: 12/26/16  6:07 AM  Result Value Ref Range   Glucose-Capillary 106 (H)  65 - 99 mg/dL     HEENT: normal Cardio: RRR Resp:CTA B with normal effort GI: BS positive and NT, ND Extremity:  No Edema Skin:   Honeycomb dressing, wound dry Neuro: Alert/Oriented, Abnormal Sensory Normal sensation in BUE, absent right L5 sensation to LT as well as right L3 and Abnormal Motor 5/5 in BUE, 3- Right knee ext ,0/5 Right ankle DF, LLE 3/5 KE 3- ADF, 3 L Hip ext--exam unchanged Musc/Skel:  Other no pain with UE or LE ROM, full AROM in BUE Gen NAD   Assessment/Plan: 1. 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 Physiatrist is providing close team supervision and 24 hour management of active medical problems listed below. Physiatrist and rehab team continue to assess barriers to discharge/monitor patient progress toward functional and medical goals. FIM: Function -  Bathing Position: Wheelchair/chair at sink Body parts bathed by patient: Right arm, Left arm, Chest, Abdomen, Front perineal area, Right upper leg, Left upper leg Body parts bathed by helper: Buttocks, Right lower leg, Left lower leg, Back Assist Level:  (mod A)  Function- Upper Body Dressing/Undressing What is the patient wearing?: Orthosis, Bra, Pull over shirt/dress Bra - Perfomed by helper: Thread/unthread right bra strap, Thread/unthread left bra strap, Hook/unhook bra (pull down sports bra) Pull over shirt/dress - Perfomed by helper: Thread/unthread right sleeve, Thread/unthread left sleeve, Put head through opening, Pull shirt over trunk Orthosis activity level: Performed by helper Assist Level: Set up Set up : To obtain clothing/put away, To apply TLSO, cervical collar Function - Lower Body Dressing/Undressing What is the patient wearing?: Underwear, Socks, Shoes Position: Wheelchair/chair at sink Underwear - Performed by helper: Thread/unthread right underwear leg, Thread/unthread left underwear leg, Pull underwear up/down Pants- Performed by helper: Thread/unthread right pants leg, Thread/unthread left pants leg, Pull pants up/down Non-skid slipper socks- Performed by helper: Don/doff right sock, Don/doff left sock Socks - Performed by helper: Don/doff right sock, Don/doff left sock Shoes - Performed by helper: Don/doff right shoe, Don/doff left shoe, Fasten right, Fasten left Assist for footwear: Dependant  Function - Toileting Toileting activity did not occur: No continent bowel/bladder event Toileting steps completed by helper: Adjust clothing prior to toileting, Performs perineal hygiene, Adjust clothing after toileting Assist level: Two helpers  Function - Air cabin crew transfer activity did not occur: Safety/medical concerns Toilet transfer assistive device: Elevated toilet seat/BSC over toilet, Grab bar Assist level to toilet: 2 helpers Assist level from  toilet: 2 helpers  Function - Chair/bed transfer Chair/bed transfer method: Other (Steady) Chair/bed transfer assist level: Total assist (Pt < 25%) Chair/bed transfer assistive device: Sliding board Mechanical lift: Stedy Chair/bed transfer details: Manual facilitation for weight shifting, Manual facilitation for placement, Verbal cues for safe use of DME/AE, Verbal cues for technique  Function - Locomotion: Wheelchair Will patient use wheelchair at discharge?: Yes Type: Manual Max wheelchair distance: 16f Assist Level: Supervision or verbal cues Assist Level: Supervision or verbal cues Assist Level: Supervision or verbal cues Turns around,maneuvers to table,bed, and toilet,negotiates 3% grade,maneuvers on rugs and over doorsills: No Function - Locomotion: Ambulation Ambulation activity did not occur: Safety/medical concerns Assistive device: Parallel bars Max distance: 5 Walk 10 feet activity did not occur: Safety/medical concerns Walk 50 feet with 2 turns activity did not occur: Safety/medical concerns Walk 150 feet activity did not occur: Safety/medical concerns Walk 10 feet on uneven surfaces activity did not occur: Safety/medical concerns  Function - Comprehension  Comprehension: Auditory Comprehension assist level: Follows complex conversation/direction with extra time/assistive device  Function - Expression Expression: Verbal Expression assist level: Expresses complex ideas: With no assist  Function - Social Interaction Social Interaction assist level: Interacts appropriately with others with medication or extra time (anti-anxiety, antidepressant).  Function - Problem Solving Problem solving assist level: Solves complex problems: With extra time  Function - Memory Memory assist level: Recognizes or recalls 90% of the time/requires cueing < 10% of the time Patient normally able to recall (first 3 days only): Current season, Location of own room, Staff names and faces,  That he or she is in a hospital  Medical Problem List and Plan: 1. 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 Back brace when out of bed Continue CIR PT, OT 2. DVT Prophylaxis/Anticoagulation: SCDs. Dopplers negative 3. Pain Management:OxyContin sustained release 20 mg every 12 hours Celebrex 200 mg every every 12 hours, Neurontin 300 mg 3 times a day, Robaxin 750 mg 4 times a day, Hydrocodone as needed 4. Mood: Provide emotional support 5. Neuropsych: This patient iscapable of making decisions on herown behalf. 6. Skin/Wound Care: Routine skin checks 7. Fluids/Electrolytes/Nutrition: encourage PO 8.Diabetes mellitus. Glucophage 500 mg twice a day. Check blood sugars before meals and at bedtime.   -cover with SSI while tapering steroids, sugars higher in PM  -decadron decr to '6mg'$  daily CBG (last 3)   Recent Labs  12/25/16 1706 12/25/16 2111 12/26/16 0607  GLUCAP 209* 115* 106*    9.Hypertension. Lisinopril 10 mg daily. Vitals:   12/25/16 1410 12/26/16 0626  BP: 122/79 136/62  Pulse: 66 60  Resp: 18 18  Temp: 98 F (36.7 C) 97.8 F (36.6 C)   10.Hyperlipidemia. Lipitor 11.Neurogenic bladder.   -succes with urecholine trial, last two pvr's only in 100's  -ua unremarkable, ucx pending  -continue voiding trial, oob to void, IC prn 12.Constipation. Neurogenic bowel, establish bowel program with Dulcolax  -senokot-s at HS  -last BM 4/12  -sorbitol this morning, fleet enema later today if needed 13.GERD. Protonix  LOS (Days) 5 A FACE TO FACE EVALUATION WAS PERFORMED  Maedell Hedger T 12/26/2016, 8:45 AM

## 2016-12-26 NOTE — Progress Notes (Signed)
Physical Therapy Note  Patient Details  Name: Megan [name] MRN: [id number] Date of Birth: [birth year] Today's Date: 12/26/2016    Time: 1050-1100 10 minutes  1:1 Pt c/o nausea, RN aware. Pt states she is unable to perform activity or get OOB due to increasing nausea.  Pt states RN is aware and plan is to change meds.  PT educated pt in importance of LE strengthening in bed if pt is unable to get up. Pt able to demo LE ROM and strengthening activities. PT encourages pt to perform LE therex in bed when she feels better. Pt missed 50 minutes skilled PT due to nausea.   Joleah Kosak 12/26/2016, 1:15 PM

## 2016-12-27 ENCOUNTER — Inpatient Hospital Stay (HOSPITAL_COMMUNITY): Payer: BLUE CROSS/BLUE SHIELD | Admitting: Occupational Therapy

## 2016-12-27 ENCOUNTER — Inpatient Hospital Stay (HOSPITAL_COMMUNITY): Payer: BLUE CROSS/BLUE SHIELD | Admitting: Physical Therapy

## 2016-12-27 DIAGNOSIS — K592 Neurogenic bowel, not elsewhere classified: Secondary | ICD-10-CM | POA: Diagnosis present

## 2016-12-27 DIAGNOSIS — N319 Neuromuscular dysfunction of bladder, unspecified: Secondary | ICD-10-CM | POA: Diagnosis present

## 2016-12-27 HISTORY — DX: Neurogenic bowel, not elsewhere classified: K59.2

## 2016-12-27 LAB — GLUCOSE, CAPILLARY
GLUCOSE-CAPILLARY: 199 mg/dL — AB (ref 65–99)
Glucose-Capillary: 103 mg/dL — ABNORMAL HIGH (ref 65–99)
Glucose-Capillary: 123 mg/dL — ABNORMAL HIGH (ref 65–99)
Glucose-Capillary: 174 mg/dL — ABNORMAL HIGH (ref 65–99)

## 2016-12-27 MED ORDER — FLEET ENEMA 7-19 GM/118ML RE ENEM: 1.0000 | ENEMA | Freq: Every day | RECTAL | Status: DC | PRN

## 2016-12-27 MED ORDER — SENNOSIDES-DOCUSATE SODIUM 8.6-50 MG PO TABS
2.0000 | ORAL_TABLET | Freq: Two times a day (BID) | ORAL | Status: DC
  Administered 2016-12-27 – 2017-01-04 (×15): 2 via ORAL
  Filled 2016-12-27 (×16): qty 2

## 2016-12-27 NOTE — Progress Notes (Signed)
Physical Therapy Session Note  Patient Details  Name: Megan [name] MRN: [id number] Date of Birth: March 05, 1951  Today's Date: 12/27/2016 PT Individual Time: 1300-1400 PT Individual Time Calculation (min): 60 min   Short Term Goals: Week 1:  PT Short Term Goal 1 (Week 1): Pt to perfrorm bed<>w/c transfers with min assist PT Short Term Goal 2 (Week 1): Pt to tolerate static standing X10 minutes with LRAD PT Short Term Goal 3 (Week 1): Pt to perform static sitting balance X 10 minutes.  PT Short Term Goal 4 (Week 1): Pt to perform supine<>sitting with supervision using logroll technique.  PT Short Term Goal 5 (Week 1): Pt to don/doff TLSO with supervision in sitting.   Skilled Therapeutic Interventions/Progress Updates:    no c/o pain but does report ongoing difficulties with B/B and nausea.  Session focus on NMR, activity tolerance, BLE strengthening, and ambulation.    Pt propels w/c to and from therapy gym mod I.  Standing exercises in // bars focus on BLE strengthening and standing tolerance 2x8-10 reps for minisquats, hip abduction, and marching with rest breaks provided as needed.  Min assist for R hip abd to initiate and to return to midline.  Pt able to transition sit<>stand in // bars with supervision.  Gait training x25' with EVA walker and min assist to steer walker.  Pt able to advance BLEs today with step through pattern in 25% of opportunities.  Min verbal cues for rest break when RLE fatigued and unable to step through without hip hike.  Nustep with BLEs for strengthening and reciprocal stepping pattern retraining at level 2 for 12 minutes with minimal rest breaks.  Pt performed supervision squat/pivot back to w/c at end of session and returned to room as above.  Call bell in reach and needs met.   Therapy Documentation Precautions:  Precautions Precautions: Back Precaution Booklet Issued: No Precaution Comments: reviewed back precautions with pt  Required Braces or Orthoses:  Spinal Brace Spinal Brace: Thoracolumbosacral orthotic, Applied in sitting position Restrictions Weight Bearing Restrictions: No   See Function Navigator for Current Functional Status.   Therapy/Group: Individual Therapy  Deshawn Skelley E Penven-Crew 12/27/2016, 2:05 PM

## 2016-12-27 NOTE — Progress Notes (Signed)
Subjective/Complaints:  Patient constipated, has been on the commode for 2 hours trying to have a bowel movement. She does have some numbness in the perineal area. In addition, she has decreased push. Has tried sorbitol, Senokot. No results, Dulcolax suppository, no result, fleets enema, no result. Nursing preparing a soap suds enema. No abdominal pain, nausea or vomiting  ROS: pt denies nausea, vomiting,cough, shortness of breath or chest pain   Objective: Vital Signs: Blood pressure 131/61, pulse 65, temperature 97.8 F (36.6 C), temperature source Oral, resp. rate 18, weight 78.2 kg (172 lb 6.4 oz), SpO2 100 %. No results found. Results for orders placed or performed during the hospital encounter of 12/21/16 (from the past 72 hour(s))  Glucose, capillary     Status: None   Collection Time: 12/24/16  6:23 AM  Result Value Ref Range   Glucose-Capillary 81 65 - 99 mg/dL  Glucose, capillary     Status: Abnormal   Collection Time: 12/24/16 11:41 AM  Result Value Ref Range   Glucose-Capillary 178 (H) 65 - 99 mg/dL   Comment 1 Notify RN   Glucose, capillary     Status: Abnormal   Collection Time: 12/24/16  4:23 PM  Result Value Ref Range   Glucose-Capillary 199 (H) 65 - 99 mg/dL  Glucose, capillary     Status: Abnormal   Collection Time: 12/24/16  9:01 PM  Result Value Ref Range   Glucose-Capillary 151 (H) 65 - 99 mg/dL  Urinalysis, Routine w reflex microscopic     Status: Abnormal   Collection Time: 12/25/16  1:05 AM  Result Value Ref Range   Color, Urine YELLOW YELLOW   APPearance HAZY (A) CLEAR   Specific Gravity, Urine 1.009 1.005 - 1.030   pH 6.0 5.0 - 8.0   Glucose, UA NEGATIVE NEGATIVE mg/dL   Hgb urine dipstick NEGATIVE NEGATIVE   Bilirubin Urine NEGATIVE NEGATIVE   Ketones, ur NEGATIVE NEGATIVE mg/dL   Protein, ur NEGATIVE NEGATIVE mg/dL   Nitrite NEGATIVE NEGATIVE   Leukocytes, UA NEGATIVE NEGATIVE   RBC / HPF 0-5 0 - 5 RBC/hpf   WBC, UA 0-5 0 - 5 WBC/hpf   Bacteria, UA MANY (A) NONE SEEN   Squamous Epithelial / LPF 0-5 (A) NONE SEEN  Glucose, capillary     Status: None   Collection Time: 12/25/16  6:29 AM  Result Value Ref Range   Glucose-Capillary 98 65 - 99 mg/dL  Glucose, capillary     Status: Abnormal   Collection Time: 12/25/16 11:43 AM  Result Value Ref Range   Glucose-Capillary 134 (H) 65 - 99 mg/dL  Glucose, capillary     Status: Abnormal   Collection Time: 12/25/16  5:06 PM  Result Value Ref Range   Glucose-Capillary 209 (H) 65 - 99 mg/dL  Glucose, capillary     Status: Abnormal   Collection Time: 12/25/16  9:11 PM  Result Value Ref Range   Glucose-Capillary 115 (H) 65 - 99 mg/dL  Glucose, capillary     Status: Abnormal   Collection Time: 12/26/16  6:07 AM  Result Value Ref Range   Glucose-Capillary 106 (H) 65 - 99 mg/dL  Glucose, capillary     Status: Abnormal   Collection Time: 12/26/16 11:35 AM  Result Value Ref Range   Glucose-Capillary 163 (H) 65 - 99 mg/dL  Glucose, capillary     Status: Abnormal   Collection Time: 12/26/16  4:50 PM  Result Value Ref Range   Glucose-Capillary 215 (H) 65 - 99 mg/dL  Glucose, capillary     Status: Abnormal   Collection Time: 12/26/16  8:49 PM  Result Value Ref Range   Glucose-Capillary 109 (H) 65 - 99 mg/dL     HEENT: normal Cardio: RRR Resp:CTA B with normal effort GI: BS positive and NT, ND Extremity:  No Edema Skin:   Honeycomb dressing, wound dry Neuro: Alert/Oriented, Abnormal Sensory Normal sensation in BUE, absent right L5 sensation to LT as well as right L3 and Abnormal Motor 5/5 in BUE, 3- Right knee ext ,0/5 Right ankle DF, LLE 3/5 KE 3- ADF, 3 L Hip ext--exam unchanged Musc/Skel:  Other no pain with UE or LE ROM, full AROM in BUE Gen NAD   Assessment/Plan: 1. 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 Physiatrist is providing close team supervision and 24  hour management of active medical problems listed below. Physiatrist and rehab team continue to assess barriers to discharge/monitor patient progress toward functional and medical goals. FIM: Function - Bathing Position: Sitting EOB Body parts bathed by patient: Right arm, Left arm, Chest, Abdomen, Front perineal area, Right upper leg, Left upper leg Body parts bathed by helper: Buttocks, Right lower leg, Left lower leg, Back Assist Level:  (mod A)  Function- Upper Body Dressing/Undressing What is the patient wearing?: Orthosis, Bra, Pull over shirt/dress Bra - Perfomed by patient: Thread/unthread right bra strap, Thread/unthread left bra strap Bra - Perfomed by helper: Hook/unhook bra (pull down sports bra) Pull over shirt/dress - Perfomed by helper: Thread/unthread right sleeve, Thread/unthread left sleeve, Put head through opening, Pull shirt over trunk Orthosis activity level: Performed by patient Assist Level: Set up Set up : To obtain clothing/put away Function - Lower Body Dressing/Undressing What is the patient wearing?: Underwear, Pants, Non-skid slipper socks, Ted Hose Position: Wheelchair/chair at [employer] - Performed by patient: Pull underwear up/down, Thread/unthread left underwear leg, Thread/unthread right underwear leg Underwear - Performed by helper: Thread/unthread right underwear leg, Thread/unthread left underwear leg, Pull underwear up/down Pants- Performed by patient: Thread/unthread right pants leg, Thread/unthread left pants leg, Pull pants up/down Pants- Performed by helper: Thread/unthread right pants leg, Thread/unthread left pants leg, Pull pants up/down Non-skid slipper socks- Performed by patient: Don/doff right sock, Don/doff left sock Non-skid slipper socks- Performed by helper: Don/doff right sock, Don/doff left sock Socks - Performed by helper: Don/doff right sock, Don/doff left sock Shoes - Performed by helper: Don/doff right shoe, Don/doff left  shoe, Fasten right, Fasten left TED Hose - Performed by helper: Don/doff right TED hose, Don/doff left TED hose Assist for footwear: Supervision/touching assist Assist for lower body dressing: Touching or steadying assistance (Pt > 75%)  Function - Toileting Toileting activity did not occur: No continent bowel/bladder event Toileting steps completed by patient: Adjust clothing prior to toileting, Performs perineal hygiene, Adjust clothing after toileting Toileting steps completed by helper: Adjust clothing prior to toileting, Performs perineal hygiene, Adjust clothing after toileting Assist level: Touching or steadying assistance (Pt.75%)  Function - Air cabin crew transfer activity did not occur: Safety/medical concerns Toilet transfer assistive device: Elevated toilet seat/BSC over toilet, Grab bar, Mechanical lift Mechanical lift: Stedy Assist level to toilet: Total assist (Pt < 25%) Assist level from toilet: Total assist (Pt < 25%)  Function - Chair/bed transfer Chair/bed transfer method: Other (Steady) Chair/bed transfer assist level: Total assist (Pt < 25%) Chair/bed transfer assistive device: Sliding board Mechanical lift: Stedy Chair/bed transfer details: Manual facilitation for weight  shifting, Manual facilitation for placement, Verbal cues for safe use of DME/AE, Verbal cues for technique  Function - Locomotion: Wheelchair Will patient use wheelchair at discharge?: Yes Type: Manual Max wheelchair distance: 135f Assist Level: Supervision or verbal cues Assist Level: Supervision or verbal cues Assist Level: Supervision or verbal cues Turns around,maneuvers to table,bed, and toilet,negotiates 3% grade,maneuvers on rugs and over doorsills: No Function - Locomotion: Ambulation Ambulation activity did not occur: Safety/medical concerns Assistive device: Parallel bars Max distance: 5 Walk 10 feet activity did not occur: Safety/medical concerns Walk 50 feet with 2  turns activity did not occur: Safety/medical concerns Walk 150 feet activity did not occur: Safety/medical concerns Walk 10 feet on uneven surfaces activity did not occur: Safety/medical concerns  Function - Comprehension Comprehension: Auditory Comprehension assist level: Follows complex conversation/direction with extra time/assistive device  Function - Expression Expression: Verbal Expression assist level: Expresses complex ideas: With no assist  Function - Social Interaction Social Interaction assist level: Interacts appropriately with others with medication or extra time (anti-anxiety, antidepressant).  Function - Problem Solving Problem solving assist level: Solves complex problems: With extra time  Function - Memory Memory assist level: Recognizes or recalls 90% of the time/requires cueing < 10% of the time Patient normally able to recall (first 3 days only): Current season, Location of own room, Staff names and faces, That he or she is in a hospital  Medical Problem List and Plan: 1. 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 Back brace when out of bed Continue CIR PT, OT 2. DVT Prophylaxis/Anticoagulation: SCDs. Dopplers negative 3. Pain Management:OxyContin sustained release 20 mg every 12 hours Celebrex 200 mg every every 12 hours, Neurontin 300 mg 3 times a day, Robaxin 750 mg 4 times a day, Hydrocodone as needed 4. Mood: Provide emotional support 5. Neuropsych: This patient iscapable of making decisions on herown behalf. 6. Skin/Wound Care: Routine skin checks 7. Fluids/Electrolytes/Nutrition: encourage PO 8.Diabetes mellitus. Glucophage 500 mg twice a day. Check blood sugars before meals and at bedtime.   -cover with SSI while tapering steroids, sugars higher in PM  -decadron decr to '6mg'$  daily as of 4/16 CBG (last 3)   Recent Labs   12/26/16 1135 12/26/16 1650 12/26/16 2049  GLUCAP 163* 215* 109*    9.Hypertension. Lisinopril 10 mg daily. Vitals:   12/26/16 2100 12/27/16 0449  BP: 132/73 131/61  Pulse: 69 65  Resp:  18  Temp:  97.8 F (36.6 C)   10.Hyperlipidemia. Lipitor 11.Neurogenic bladder.   -success with urecholine 10 mg 3 times a day trial, last two pvr's only in 100's  -ua unremarkable, ucx pending  -continue voiding trial, oob to void, IC prn 12.Constipation. Neurogenic bowel, establish bowel program with Dulcolax  -senokot-s increased to 2 tablets twice a day  -last BM 4/12  -Soap suds enema this morning 13.GERD. Protonix  LOS (Days) 6 A FACE TO FACE EVALUATION WAS PERFORMED  Jaidy Cottam E 12/27/2016, 5:59 AM

## 2016-12-27 NOTE — Progress Notes (Signed)
Occupational Therapy Session Note  Patient Details  Name: Megan [name] MRN: [id number] Date of Birth: 1951/01/17  Today's Date: 12/27/2016   Session 1 OT Individual Time: 0900-0910 OT Individual Time Calculation (min): 10 min   Session 2 OT Individual Time: 1100-1210 OT Individual Time Calculation (min): 70 min    Short Term Goals: Week 1:  OT Short Term Goal 1 (Week 1): Pt will perform toilet transfer with mod A in order to decrease level of assistance. OT Short Term Goal 2 (Week 1): Pt will maintain standing balance for 2 minutes during LB clothing management and hygiene with min A. OT Short Term Goal 3 (Week 1): Pt will perform LB dressing with mod A in order to decrease level of assistance. OT Short Term Goal 4 (Week 1): Pt will engage in 10 minutes of functional task with 2 rest breaks or less.   Skilled Therapeutic Interventions/Progress Updates:    Session 1 Pt greeted in sidelying in bed. Pt reported just receiving enema and declined toilet transfer or any OOB activity at this time. Educated pt on importance of mobility and LB there-ex. Pt left in bed with needs met.   Session 2 Pt greeted seated on toilet with RN present. Pt able to have small BM and voided bladder. Sit<>stand using Stedy with Min A for toileting thoroughness. Stedy removed and pt completed squat-pivot > R from drop arm toilet with Mod A. Discussed transitioning to squat-pivot transfers instead of using Stedy with nursing staff and pt agreeable. UB bathing/dressing at the sink with overall set-up A. Reacher utilized to thread pants with min VC for technique. Mod A sit<>stand + R knee block, then pt able to remove unilateral UE from sink to assist with pulling pants up over hips. Focused on LB dressing techniques using reacher and shoe horn. Pt required assistance to lift R LE to place R foot into shoe. Pt also required assist to fasten shoes and will benefit from elastic shoe laces at next OT session. Pt then  performed 5 sit<>stands with VC for hand placement and technique. Pt required Min/Mod A overall with seated rest breaks in between 2/2 increased pain with transitional movements.  Pt left seated in wc at end of session with needs met and call bell in reach.   Therapy Documentation Precautions:  Precautions Precautions: Back Precaution Booklet Issued: No Precaution Comments: reviewed back precautions with pt  Required Braces or Orthoses: Spinal Brace Spinal Brace: Thoracolumbosacral orthotic, Applied in sitting position Restrictions Weight Bearing Restrictions: No General: General OT Amount of Missed Time: 55 Minutes Pain: Pain Assessment Pain Assessment: 0-10 Pain Score: 2  Pain Type: Surgical pain Pain Location: Back Pain Orientation: Mid Pain Descriptors / Indicators: Aching Pain Onset: On-going Pain Intervention(s): Repositioned  See Function Navigator for Current Functional Status.   Therapy/Group: Individual Therapy  [name] 12/27/2016, 12:26 PM

## 2016-12-28 ENCOUNTER — Inpatient Hospital Stay (HOSPITAL_COMMUNITY): Payer: BLUE CROSS/BLUE SHIELD | Admitting: Physical Therapy

## 2016-12-28 ENCOUNTER — Inpatient Hospital Stay (HOSPITAL_COMMUNITY): Payer: BLUE CROSS/BLUE SHIELD | Admitting: Occupational Therapy

## 2016-12-28 LAB — GLUCOSE, CAPILLARY
Glucose-Capillary: 80 mg/dL (ref 65–99)
Glucose-Capillary: 124 mg/dL — ABNORMAL HIGH (ref 65–99)
Glucose-Capillary: 192 mg/dL — ABNORMAL HIGH (ref 65–99)
Glucose-Capillary: 141 mg/dL — ABNORMAL HIGH (ref 65–99)

## 2016-12-28 LAB — URINE CULTURE: Culture: >=100000 — AB

## 2016-12-28 MED ORDER — BETHANECHOL CHLORIDE 25 MG PO TABS
25.0000 mg | ORAL_TABLET | Freq: Three times a day (TID) | ORAL | Status: DC
  Administered 2016-12-28 – 2016-12-30 (×8): 25 mg via ORAL
  Filled 2016-12-28 (×8): qty 1

## 2016-12-28 MED ORDER — CIPROFLOXACIN HCL 500 MG PO TABS
250.0000 mg | ORAL_TABLET | Freq: Two times a day (BID) | ORAL | Status: AC
Start: 2016-12-28 — End: 2017-01-03
  Administered 2016-12-28 – 2017-01-03 (×14): 250 mg via ORAL
  Filled 2016-12-28 (×14): qty 1

## 2016-12-28 NOTE — Progress Notes (Signed)
Pt reported that cell phone was missing after therapy session. Cell phone was located in linen bag and pt reminded of importance of storing valuables out of sight or sending valuables home with family if possible. Pt advised of [facility]'s belongings policy. Continue plan of care.

## 2016-12-28 NOTE — Progress Notes (Signed)
Physical Therapy Session Note  Patient Details  Name: Megan [name] MRN: [id number] Date of Birth: 1950-11-06  Today's Date: 12/28/2016 PT Individual Time: 1607-3710 PT Individual Time Calculation (min): 39 min   Short Term Goals: Week 1:  PT Short Term Goal 1 (Week 1): Pt to perfrorm bed<>w/c transfers with min assist PT Short Term Goal 2 (Week 1): Pt to tolerate static standing X10 minutes with LRAD PT Short Term Goal 3 (Week 1): Pt to perform static sitting balance X 10 minutes.  PT Short Term Goal 4 (Week 1): Pt to perform supine<>sitting with supervision using logroll technique.  PT Short Term Goal 5 (Week 1): Pt to don/doff TLSO with supervision in sitting.   Skilled Therapeutic Interventions/Progress Updates:  Pt received in w/c & agreeable to tx. Pt adjusted TLSO with cuing from therapist for various technique. Pt propelled w/c room>gym with BUE & supervision for cardiopulmonary endurance training. Pt performed standing hip/knee flexion and seated long arc quads with instructional cuing for technique. Activity focused on BLE strengthening and pt demonstrates impaired hip flexion (RLE worse than LLE).  Attempted to perform stair negotiation but when pt transferred sit>stand from w/c pt with B knee buckling requiring BUE support and max assist to extend knees enough to return to w/c seat, therefore stairs not attempted. Pt then performed 4# weighted bar ball taps for cardiopulmonary endurance training & BUE strengthening. Pt propelled w/c throughout unit & dayroom, back to room. At end of session pt left sitting in w/c in room with NT present & all needs within reach.   Therapy Documentation Precautions:  Precautions Precautions: Back Precaution Booklet Issued: No Precaution Comments: reviewed back precautions with pt  Required Braces or Orthoses: Spinal Brace Spinal Brace: Thoracolumbosacral orthotic, Applied in sitting position Restrictions Weight Bearing Restrictions:  No  Pain: Denied c/o pain.   See Function Navigator for Current Functional Status.   Therapy/Group: Individual Therapy  Waunita Schooner 12/28/2016, 5:26 PM

## 2016-12-28 NOTE — Progress Notes (Signed)
Physical Therapy Session Note  Patient Details  Name: Megan [name] MRN: [id number] Date of Birth: [birth year]  Today's Date: 12/28/2016 PT Individual Time: 1000-1030 PT Individual Time Calculation (min): 30 min   Short Term Goals: Week 1:  PT Short Term Goal 1 (Week 1): Pt to perfrorm bed<>w/c transfers with min assist PT Short Term Goal 2 (Week 1): Pt to tolerate static standing X10 minutes with LRAD PT Short Term Goal 3 (Week 1): Pt to perform static sitting balance X 10 minutes.  PT Short Term Goal 4 (Week 1): Pt to perform supine<>sitting with supervision using logroll technique.  PT Short Term Goal 5 (Week 1): Pt to don/doff TLSO with supervision in sitting.   Skilled Therapeutic Interventions/Progress Updates:    no c/o pain.  Session focus on ambulation with PLS AFO and EVA.  Pt transitions sit<>stand at sink with supervision and PT pulls pants over hips.  TEDs and shoes donned total assist for time management.  Pt propels w/c to and from therapy gym mod I.  PT applied PLS AFO to pt's RLE for improved efficiency and energy conservation with ambulation. Gait training x33' with EVA with improved RLE swing through in 50% of trials.  Pt returned to room as above and positioned to comfort with call bell in reach and needs met.   Therapy Documentation Precautions:  Precautions Precautions: Back Precaution Booklet Issued: No Precaution Comments: reviewed back precautions with pt  Required Braces or Orthoses: Spinal Brace Spinal Brace: Thoracolumbosacral orthotic, Applied in sitting position Restrictions Weight Bearing Restrictions: No  See Function Navigator for Current Functional Status.   Therapy/Group: Individual Therapy  Benjimin Hadden E Penven-Crew 12/28/2016, 10:34 AM

## 2016-12-28 NOTE — Progress Notes (Signed)
Occupational Therapy Session Note  Patient Details  Name: Megan [name] MRN: [id number] Date of Birth: 1950-12-26  Today's Date: 12/28/2016  Session 1 OT Individual Time: 1102-1201 OT Individual Time Calculation (min): 59 min   Session 2 OT Individual Time: 1430-1530 OT Individual Time Calculation (min): 60 min    Short Term Goals: Week 1:  OT Short Term Goal 1 (Week 1): Pt will perform toilet transfer with mod A in order to decrease level of assistance. OT Short Term Goal 2 (Week 1): Pt will maintain standing balance for 2 minutes during LB clothing management and hygiene with min A. OT Short Term Goal 3 (Week 1): Pt will perform LB dressing with mod A in order to decrease level of assistance. OT Short Term Goal 4 (Week 1): Pt will engage in 10 minutes of functional task with 2 rest breaks or less.   Skilled Therapeutic Interventions/Progress Updates:  Session 1   1:1 OT treatment session focused on LB dressing techniques, improved sit<>stand, and dynamic standing balance. Discussed skin checks and modified strategies for donning/doffing R shoe w/ AFO. Pt able to assist R LE into figure 4 position, then required assistance to stabilize R LE onto knee while pt used B UE's to place shoe and AFO while maintaining back precautions. OT assisted pt with fastening AFO and shoe laces. Home management task of dish washing at the sink with focus on standing balance/endurance. Manual facilitation for weight shift onto R LE and hip extension. Educated on hip balance strategies. Pt engaged in sit<>stand therapeutic activity using clothespin task, focus on anterior weight shift and technique for safe sit<>stand. Pt then completed stand-step turn transfer to return to wc with RW and Mod A.    Session 2 OT treatment session focused on LB strengthening, functional mobility, there-ex, and LB dressing techniques. Pt propelled w/c room>therapy gym, squat-pivot wc<>NuStep to R with min guard A. 5 min on  level 2 with just LB, rest break , then completed 5 more mins on level 1 2/2 LE fatigue. OT applied shoe buttons and pt able to achieve figure 4 position to don/doff shoe lace into shoe button with unilateral UE while other UE assists with holding LE in place. Pt then completed 3 sets x 10 reps hip abduction using level 3 green thera-band. Pt ambulated 10 steps w/ RW and min A and exaggerated hit hike. Pt returned to room in wc, took rest break, then demonstrated ability to ambulate room distance to bathroom with min A. Pt left seated in wc at end of session with needs met and call bell in reach.   Therapy Documentation Precautions:  Precautions Precautions: Back Precaution Booklet Issued: No Precaution Comments: reviewed back precautions with pt  Required Braces or Orthoses: Spinal Brace Spinal Brace: Thoracolumbosacral orthotic, Applied in sitting position Restrictions Weight Bearing Restrictions: No Pain: Pain Assessment Pain Assessment: 0-10 Pain Score: 2  Pain Type: Acute pain Pain Location: Back Pain Orientation: Mid;Lower Pain Intervention(s): Repositioned  See Function Navigator for Current Functional Status.   Therapy/Group: Individual Therapy  [name] 12/28/2016, 3:43 PM

## 2016-12-28 NOTE — Progress Notes (Signed)
Physical Therapy Session Note  Patient Details  Name: Megan [name] MRN: [id number] Date of Birth: 20-Jul-1951  Today's Date: 12/28/2016 PT Individual Time: 2706-2376 PT Individual Time Calculation (min): 45 min   Short Term Goals: Week 1:  PT Short Term Goal 1 (Week 1): Pt to perfrorm bed<>w/c transfers with min assist PT Short Term Goal 2 (Week 1): Pt to tolerate static standing X10 minutes with LRAD PT Short Term Goal 3 (Week 1): Pt to perform static sitting balance X 10 minutes.  PT Short Term Goal 4 (Week 1): Pt to perform supine<>sitting with supervision using logroll technique.  PT Short Term Goal 5 (Week 1): Pt to don/doff TLSO with supervision in sitting.   Skilled Therapeutic Interventions/Progress Updates:    no c/o pain.  Session focus on ambulation, transfers, and activity tolerance.    Pt transitions sit<>stand from w/c and therapy mat with close supervision using RW.  Min verbal cues for hand placement when returning to sit.  Gait training x35' with RW and steady assist. Standing tolerance x3 min +2 min during table top activity for activity tolerance and posture.  Gait training x83' with RW and steady assist>close supervision.  Pt returned to room at end of session and positioned with call bell in reach and needs met.   Therapy Documentation Precautions:  Precautions Precautions: Back Precaution Booklet Issued: No Precaution Comments: reviewed back precautions with pt  Required Braces or Orthoses: Spinal Brace Spinal Brace: Thoracolumbosacral orthotic, Applied in sitting position Restrictions Weight Bearing Restrictions: No   See Function Navigator for Current Functional Status.   Therapy/Group: Individual Therapy  Earnest Conroy Penven-Crew 12/28/2016, 1:49 PM

## 2016-12-28 NOTE — Progress Notes (Addendum)
Subjective/Complaints:   Multiple BMs after SSE, denies abdominal pain. Was catheterized once last night  ROS: pt denies nausea, vomiting,cough, shortness of breath or chest pain   Objective: Vital Signs: Blood pressure 115/60, pulse 62, temperature 97.4 F (36.3 C), temperature source Axillary, resp. rate 18, weight 78.2 kg (172 lb 6.4 oz), SpO2 97 %. No results found. Results for orders placed or performed during the hospital encounter of 12/21/16 (from the past 72 hour(s))  Glucose, capillary     Status: Abnormal   Collection Time: 12/25/16 11:43 AM  Result Value Ref Range   Glucose-Capillary 134 (H) 65 - 99 mg/dL  Glucose, capillary     Status: Abnormal   Collection Time: 12/25/16  5:06 PM  Result Value Ref Range   Glucose-Capillary 209 (H) 65 - 99 mg/dL  Glucose, capillary     Status: Abnormal   Collection Time: 12/25/16  9:11 PM  Result Value Ref Range   Glucose-Capillary 115 (H) 65 - 99 mg/dL  Glucose, capillary     Status: Abnormal   Collection Time: 12/26/16  6:07 AM  Result Value Ref Range   Glucose-Capillary 106 (H) 65 - 99 mg/dL  Glucose, capillary     Status: Abnormal   Collection Time: 12/26/16 11:35 AM  Result Value Ref Range   Glucose-Capillary 163 (H) 65 - 99 mg/dL  Glucose, capillary     Status: Abnormal   Collection Time: 12/26/16  4:50 PM  Result Value Ref Range   Glucose-Capillary 215 (H) 65 - 99 mg/dL  Glucose, capillary     Status: Abnormal   Collection Time: 12/26/16  8:49 PM  Result Value Ref Range   Glucose-Capillary 109 (H) 65 - 99 mg/dL  Glucose, capillary     Status: Abnormal   Collection Time: 12/27/16  6:44 AM  Result Value Ref Range   Glucose-Capillary 103 (H) 65 - 99 mg/dL  Glucose, capillary     Status: Abnormal   Collection Time: 12/27/16 12:13 PM  Result Value Ref Range   Glucose-Capillary 123 (H) 65 - 99 mg/dL  Glucose, capillary     Status: Abnormal   Collection Time: 12/27/16  4:52 PM  Result Value Ref Range   Glucose-Capillary 174 (H) 65 - 99 mg/dL  Glucose, capillary     Status: Abnormal   Collection Time: 12/27/16  9:49 PM  Result Value Ref Range   Glucose-Capillary 199 (H) 65 - 99 mg/dL     HEENT: normal Cardio: RRR Resp:CTA B with normal effort GI: BS positive and NT, ND Extremity:  No Edema Skin:   Honeycomb dressing, wound dry Neuro: Alert/Oriented, Abnormal Sensory Normal sensation in BUE, absent right L5 sensation to LT as well as right L3 and Abnormal Motor 5/5 in BUE, 3- Right knee ext ,0/5 Right ankle DF, LLE 3/5 KE 3- ADF, 3 L Hip ext--exam unchanged Musc/Skel:  Other no pain with UE or LE ROM, full AROM in BUE Gen NAD   Assessment/Plan: 1. 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 Physiatrist is providing close team supervision and 24 hour management of active medical problems listed below. Physiatrist and rehab team continue to assess barriers to discharge/monitor patient progress toward functional and medical goals. FIM: Function - Bathing Position: Wheelchair/chair at sink Body parts bathed by patient: Left arm, Right arm, Front perineal area, Chest, Abdomen, Right upper leg, Left upper leg Body parts bathed by helper: Buttocks, Back Bathing not applicable:  Left lower leg, Right lower leg Assist Level: Touching or steadying assistance(Pt > 75%)  Function- Upper Body Dressing/Undressing What is the patient wearing?: Orthosis, Bra, Pull over shirt/dress Bra - Perfomed by patient: Thread/unthread right bra strap, Thread/unthread left bra strap Bra - Perfomed by helper: Hook/unhook bra (pull down sports bra) Pull over shirt/dress - Perfomed by helper: Thread/unthread right sleeve, Thread/unthread left sleeve, Put head through opening, Pull shirt over trunk Orthosis activity level: Performed by patient Assist Level: Set up Set up : To obtain clothing/put away Function  - Lower Body Dressing/Undressing What is the patient wearing?: Pants, Maryln Manuel, Shoes Position: Wheelchair/chair at [employer] - Performed by patient: Pull underwear up/down, Thread/unthread left underwear leg, Thread/unthread right underwear leg Underwear - Performed by helper: Thread/unthread right underwear leg, Thread/unthread left underwear leg, Pull underwear up/down Pants- Performed by patient: Thread/unthread right pants leg, Thread/unthread left pants leg, Pull pants up/down Pants- Performed by helper: Thread/unthread right pants leg, Thread/unthread left pants leg, Pull pants up/down Non-skid slipper socks- Performed by patient: Don/doff right sock, Don/doff left sock Non-skid slipper socks- Performed by helper: Don/doff right sock, Don/doff left sock Socks - Performed by helper: Don/doff right sock, Don/doff left sock Shoes - Performed by patient: Don/doff left shoe Shoes - Performed by helper: Don/doff right shoe, Fasten right, Fasten left TED Hose - Performed by helper: Don/doff left TED hose, Don/doff right TED hose Assist for footwear: Supervision/touching assist Assist for lower body dressing: Assistive device, Touching or steadying assistance (Pt > 75%) Assistive Device Comment: sock-aid, reacher, shoe horn  Function - Toileting Toileting steps completed by patient: Performs perineal hygiene Toileting steps completed by helper: Adjust clothing prior to toileting, Adjust clothing after toileting Toileting Assistive Devices: Other (comment) (stedy, requires assist to pull up and down) Assist level: Touching or steadying assistance (Pt.75%)  Function - Toilet Transfers Toilet transfer assistive device: Elevated toilet seat/BSC over toilet, Drop arm commode Mechanical lift: Stedy Assist level to toilet: Dependent (Pt equals 0%) Assist level from toilet: Moderate assist (Pt 50 - 74%/lift or lower) (squat-pivot)  Function - Chair/bed transfer Chair/bed transfer method:  Other (Steady) Chair/bed transfer assist level: Total assist (Pt < 25%) Chair/bed transfer assistive device: Mechanical lift Mechanical lift: Stedy Chair/bed transfer details: Manual facilitation for weight shifting, Manual facilitation for placement, Verbal cues for safe use of DME/AE, Verbal cues for technique  Function - Locomotion: Wheelchair Will patient use wheelchair at discharge?: Yes Type: Manual Max wheelchair distance: 136f Assist Level: Supervision or verbal cues Assist Level: Supervision or verbal cues Assist Level: Supervision or verbal cues Turns around,maneuvers to table,bed, and toilet,negotiates 3% grade,maneuvers on rugs and over doorsills: No Function - Locomotion: Ambulation Ambulation activity did not occur: Safety/medical concerns Assistive device: Parallel bars Max distance: 5 Walk 10 feet activity did not occur: Safety/medical concerns Walk 50 feet with 2 turns activity did not occur: Safety/medical concerns Walk 150 feet activity did not occur: Safety/medical concerns Walk 10 feet on uneven surfaces activity did not occur: Safety/medical concerns  Function - Comprehension Comprehension: Auditory Comprehension assist level: Follows complex conversation/direction with extra time/assistive device  Function - Expression Expression: Verbal Expression assist level: Expresses complex ideas: With no assist  Function - Social Interaction Social Interaction assist level: Interacts appropriately with others with medication or extra time (anti-anxiety, antidepressant).  Function - Problem Solving Problem solving assist level: Solves complex problems: With extra time  Function - Memory Memory assist level: Recognizes or recalls 90% of the time/requires cueing < 10% of  the time Patient normally able to recall (first 3 days only): Current season, Location of own room, Staff names and faces, That he or she is in a hospital  Medical Problem List and Plan: 1.  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 Back brace when out of bed Continue CIR PT, OT, team conference in a.m. 2. DVT Prophylaxis/Anticoagulation: SCDs. Dopplers negative 3. Pain Management:OxyContin sustained release 20 mg every 12 hours Celebrex 200 mg every every 12 hours, Neurontin 300 mg 3 times a day, Robaxin 750 mg 4 times a day, Hydrocodone as needed 4. Mood: Provide emotional support 5. Neuropsych: This patient iscapable of making decisions on herown behalf. 6. Skin/Wound Care: Routine skin checks 7. Fluids/Electrolytes/Nutrition: encourage PO 8.Diabetes mellitus. Glucophage 500 mg twice a day. Check blood sugars before meals and at bedtime.   -cover with SSI while tapering steroids, sugars higher in PM  -decadron decr to '6mg'$  daily as of 4/16 CBG (last 3)   Recent Labs  12/27/16 1213 12/27/16 1652 12/27/16 2149  GLUCAP 123* 174* 199*    9.Hypertension. Lisinopril 10 mg daily. Vitals:   12/27/16 1408 12/28/16 0610  BP: (!) 107/58 115/60  Pulse: 83 62  Resp: 20 18  Temp: 98.1 F (36.7 C) 97.4 F (36.3 C)   10.Hyperlipidemia. Lipitor 11.Neurogenic bladder.   -success with urecholine 10 mg 3 times a day trial, -ua  ucx >100K Kleb , Start antibiotic pending sensitivities  -continue voiding trial, oob to void, IC prn 12.Constipation. Neurogenic bowel,  -senokot-s increased to 2 tablets twice a day  -last BM 4/16  -Soap suds enema multiple stools, Initiate Dulcolax suppository daily 13.GERD. Protonix  LOS (Days) 7 A FACE TO FACE EVALUATION WAS PERFORMED  Daysie Helf E 12/28/2016, 6:43 AM

## 2016-12-29 ENCOUNTER — Inpatient Hospital Stay (HOSPITAL_COMMUNITY): Payer: BLUE CROSS/BLUE SHIELD | Admitting: Physical Therapy

## 2016-12-29 ENCOUNTER — Inpatient Hospital Stay (HOSPITAL_COMMUNITY): Payer: BLUE CROSS/BLUE SHIELD | Admitting: Occupational Therapy

## 2016-12-29 LAB — GLUCOSE, CAPILLARY
GLUCOSE-CAPILLARY: 105 mg/dL — AB (ref 65–99)
GLUCOSE-CAPILLARY: 172 mg/dL — AB (ref 65–99)
Glucose-Capillary: 198 mg/dL — ABNORMAL HIGH (ref 65–99)
Glucose-Capillary: 193 mg/dL — ABNORMAL HIGH (ref 65–99)

## 2016-12-29 NOTE — Progress Notes (Signed)
Physical Therapy Note  Patient Details  Name: BALBINA DEPACE MRN: [id number] Date of Birth: 1950/11/03 Today's Date: 12/29/2016  0930-1000, 30 min individual tx Make up session No pain  tx focused on neuromuscular re-education via forced use, visual feedback for L hip neutral rotation during marching in sitting, on Kinetron at level 50 x 3mycles x 2.  Gait with RW, min guard, w/c follow x 50' straight path on level tile.  modified independent w;c propulsion on unit to/from gym.  Pt left resting in w/c with all needs within reach.  See function navigator for current status.     Rudie Rikard 12/29/2016, 10:02 AM

## 2016-12-29 NOTE — Plan of Care (Signed)
Problem: RH Ambulation Goal: LTG Patient will ambulate in controlled environment (PT) LTG: Patient will ambulate in a controlled environment, # of feet with assistance (PT). Added due to pt progress

## 2016-12-29 NOTE — Progress Notes (Signed)
Physical Therapy Session Note  Patient Details  Name: Megan [name] MRN: [id number] Date of Birth: Jun 06, 1951  Today's Date: 12/29/2016 PT Individual Time: 1430-1500 PT Individual Time Calculation (min): 30 min   Short Term Goals: Week 1:  PT Short Term Goal 1 (Week 1): Pt to perfrorm bed<>w/c transfers with min assist PT Short Term Goal 1 - Progress (Week 1): Met PT Short Term Goal 2 (Week 1): Pt to tolerate static standing X10 minutes with LRAD PT Short Term Goal 2 - Progress (Week 1): Progressing toward goal PT Short Term Goal 3 (Week 1): Pt to perform static sitting balance X 10 minutes.  PT Short Term Goal 3 - Progress (Week 1): Met PT Short Term Goal 4 (Week 1): Pt to perform supine<>sitting with supervision using logroll technique.  PT Short Term Goal 4 - Progress (Week 1): Met PT Short Term Goal 5 (Week 1): Pt to don/doff TLSO with supervision in sitting.  PT Short Term Goal 5 - Progress (Week 1): Partly met  Skilled Therapeutic Interventions/Progress Updates:    Pt in w/c upon arrival, agreeable to PT session. Transfers: sit<>stand supervision, Ambulation: 85 ft with rw, AFO on Rt, chair follow for safety. Pt reports Rt LE fatigue limiting distance. Standing balance activities performed with single UE support on wheeled tray table, working on single extremity tasks. Max standing time of 2.5 min, sitting due to feeling unsteady. Pt returned to room via w/c (total assist due to time). Pt in w/c in room with all needs in reach.   Therapy Documentation Precautions:  Precautions Precautions: Back Precaution Booklet Issued: No Precaution Comments: reviewed back precautions with pt  Required Braces or Orthoses: Spinal Brace Spinal Brace: Thoracolumbosacral orthotic, Applied in sitting position Restrictions Weight Bearing Restrictions: No   Pain:  Reports minimal pain, denies need for pain medication, monitored during session.   See Function Navigator for Current Functional  Status.   Therapy/Group: Individual Therapy  Linard Millers, PT 12/29/2016, 4:24 PM

## 2016-12-29 NOTE — Plan of Care (Signed)
Problem: RH Stairs Goal: LTG Patient will ambulate up and down stairs w/assist (PT) LTG: Patient will ambulate up and down # of stairs with assistance (PT)  Upgraded due to progress

## 2016-12-29 NOTE — Progress Notes (Signed)
Occupational Therapy Session Note  Patient Details  Name: Megan [name] MRN: [id number] Date of Birth: 07-07-1951  Today's Date: 12/29/2016 OT Individual Time: 0800-0900 OT Individual Time Calculation (min): 60 min    Short Term Goals: Week 1:  OT Short Term Goal 1 (Week 1): Pt will perform toilet transfer with mod A in order to decrease level of assistance. OT Short Term Goal 2 (Week 1): Pt will maintain standing balance for 2 minutes during LB clothing management and hygiene with min A. OT Short Term Goal 3 (Week 1): Pt will perform LB dressing with mod A in order to decrease level of assistance. OT Short Term Goal 4 (Week 1): Pt will engage in 10 minutes of functional task with 2 rest breaks or less.   Skilled Therapeutic Interventions/Progress Updates:    OT treatment session focused on modified bathing/dressing, toileting strategies, standing balance, and functional mobility. Log roll with min A to advance R LE off of bed and min A for side-lying to sit to elevate trunk. Set-up A to don TLSO.  Sit<>stand from lower EOB with 2 trials and Mod A. Ambulated 5 feet to wc at sink with min A. Bathing completed w/ long-handled sponge and min guard A for balance in standing. Demonstrated use of extension toilet aid and discussed toileting strategies while maintaining back precautions. LB dressing completed with set-up A using long-handled AE, and min gaiurd A for balance when pulling up pants. Pt reported need for bathroom, ambulated into bathroom with min guard A and 1 near lateral LOB when turning to sit onto toilet. Pt voided bladder successfully and completed hygiene. LB dressing with overall min A for donning TED hose and shoes w/ long-handed AE. Pt left with needs met and call bell in reach.   Therapy Documentation Precautions:  Precautions Precautions: Back Precaution Booklet Issued: No Precaution Comments: reviewed back precautions with pt  Required Braces or Orthoses: Spinal  Brace Spinal Brace: Thoracolumbosacral orthotic, Applied in sitting position Restrictions Weight Bearing Restrictions: No Pain: Pain Assessment Pain Assessment: 0-10 Pain Score: 2  Pain Type: Acute pain Pain Location: Back Pain Orientation: Mid Pain Descriptors / Indicators: Aching Pain Intervention(s): Repositioned  See Function Navigator for Current Functional Status.   Therapy/Group: Individual Therapy  [name] 12/29/2016, 4:35 PM

## 2016-12-29 NOTE — Progress Notes (Signed)
Physical Therapy Session Note  Patient Details  Name: Megan [name] MRN: [id number] Date of Birth: 1950/09/20  Today's Date: 12/29/2016 PT Individual Time: 1130-1200 PT Individual Time Calculation (min): 30 min   Short Term Goals: Week 1:  PT Short Term Goal 1 (Week 1): Pt to perfrorm bed<>w/c transfers with min assist PT Short Term Goal 2 (Week 1): Pt to tolerate static standing X10 minutes with LRAD PT Short Term Goal 3 (Week 1): Pt to perform static sitting balance X 10 minutes.  PT Short Term Goal 4 (Week 1): Pt to perform supine<>sitting with supervision using logroll technique.  PT Short Term Goal 5 (Week 1): Pt to don/doff TLSO with supervision in sitting.   Skilled Therapeutic Interventions/Progress Updates:    no c/o pain.  Session focus on activity tolerance, and strengthening.    Pt propels w/c to and from therapy gym mod I for mobility and for cardiovascular endurance/UE strengthening.  LE therex x10 reps bilaterally of hip flexion (pt unable to flex R hip against gravity in w/c), AROM knee flex/ext with pillowcase under foot to decrease friction on R, hip abd against level 2 theraband, and pillow squeezes.  Discussed with pt importance of pressure relief and she reports she changes position frequently throughout the day to relieve pressure on her bottom.  Pt returned to room at end of session with needs met.   Therapy Documentation Precautions:  Precautions Precautions: Back Precaution Booklet Issued: No Precaution Comments: reviewed back precautions with pt  Required Braces or Orthoses: Spinal Brace Spinal Brace: Thoracolumbosacral orthotic, Applied in sitting position Restrictions Weight Bearing Restrictions: No   See Function Navigator for Current Functional Status.   Therapy/Group: Individual Therapy  Earnest Conroy Penven-Crew 12/29/2016, 12:15 PM

## 2016-12-29 NOTE — Progress Notes (Signed)
Physical Therapy Weekly Progress Note  Patient Details  Name: Megan [name] MRN: [id number] Date of Birth: 1950/12/13  Beginning of progress report period: December 22, 2016 End of progress report period: December 29, 2016  Today's Date: 12/29/2016 PT Individual Time: 1300-1415 PT Individual Time Calculation (min): 75 min   Patient has met 3 of 5 short term goals.  Pt has made excellent progress with strength, activity tolerance, and mobility this reporting period and has surpassed her locomotion LTGs. Currently performing all mobility with supervision or better.   Patient continues to demonstrate the following deficits muscle weakness, impaired timing and sequencing, abnormal tone and unbalanced muscle activation and decreased standing balance, decreased postural control and decreased balance strategies and therefore will continue to benefit from skilled PT intervention to increase functional independence with mobility.  Patient progressing toward long term goals..  Plan of care revisions: upgraded ambulation goals.  PT Short Term Goals Week 1:  PT Short Term Goal 1 (Week 1): Pt to perfrorm bed<>w/c transfers with min assist PT Short Term Goal 1 - Progress (Week 1): Met PT Short Term Goal 2 (Week 1): Pt to tolerate static standing X10 minutes with LRAD PT Short Term Goal 2 - Progress (Week 1): Progressing toward goal PT Short Term Goal 3 (Week 1): Pt to perform static sitting balance X 10 minutes.  PT Short Term Goal 3 - Progress (Week 1): Met PT Short Term Goal 4 (Week 1): Pt to perform supine<>sitting with supervision using logroll technique.  PT Short Term Goal 4 - Progress (Week 1): Met PT Short Term Goal 5 (Week 1): Pt to don/doff TLSO with supervision in sitting.  PT Short Term Goal 5 - Progress (Week 1): Partly met Week 2:  PT Short Term Goal 1 (Week 2): =LTGs due to ELOS  Skilled Therapeutic Interventions/Progress Updates:    no c/o pain.  Session focus on LE strengthening and  activity tolerance.    Pt propels w/c to and from therapy gym mod I.  Stair negotiation x7 steps (3" height) with 2 rails and steady assist, min verbal cues for leading with LLE to ascend and RLE to descend backwards.  Pt ambulates through obstacle course focus on stepping around/over low obstacles with RW and min assist.  Nustep x10 minutes with BLEs at level 2 for strengthening and coordination.  PT assessed pt's R foot for signs of AFO intolerance, pt's foot noted to be edemous and red, but blanchable and no areas of redness noted underneath AFO specifically.  Pt engaged in pet therapy during AFO assessment.  Gait training [id number] with RW and close supervision>steady assist, pt demos hip hike and decreased RLE coordination with swing through.  Pt returned to room in w/c at end of session, call bell in reach and needs met.   Therapy Documentation Precautions:  Precautions Precautions: Back Precaution Booklet Issued: No Precaution Comments: reviewed back precautions with pt  Required Braces or Orthoses: Spinal Brace Spinal Brace: Thoracolumbosacral orthotic, Applied in sitting position Restrictions Weight Bearing Restrictions: No   See Function Navigator for Current Functional Status.  Therapy/Group: Individual Therapy  Earnest Conroy Penven-Crew 12/29/2016, 1:54 PM

## 2016-12-29 NOTE — Progress Notes (Signed)
Subjective/Complaints:   Patient has been voiding without need for catheterization. Bowel movement without Dulcolax yesterday  ROS: pt denies nausea, vomiting,cough, shortness of breath or chest pain   Objective: Vital Signs: Blood pressure (!) 149/72, pulse 68, temperature 98 F (36.7 C), temperature source Oral, resp. rate 17, weight 78.4 kg (172 lb 13.5 oz), SpO2 98 %. No results found. Results for orders placed or performed during the hospital encounter of 12/21/16 (from the past 72 hour(s))  Glucose, capillary     Status: Abnormal   Collection Time: 12/26/16 11:35 AM  Result Value Ref Range   Glucose-Capillary 163 (H) 65 - 99 mg/dL  Glucose, capillary     Status: Abnormal   Collection Time: 12/26/16  4:50 PM  Result Value Ref Range   Glucose-Capillary 215 (H) 65 - 99 mg/dL  Glucose, capillary     Status: Abnormal   Collection Time: 12/26/16  8:49 PM  Result Value Ref Range   Glucose-Capillary 109 (H) 65 - 99 mg/dL  Glucose, capillary     Status: Abnormal   Collection Time: 12/27/16  6:44 AM  Result Value Ref Range   Glucose-Capillary 103 (H) 65 - 99 mg/dL  Glucose, capillary     Status: Abnormal   Collection Time: 12/27/16 12:13 PM  Result Value Ref Range   Glucose-Capillary 123 (H) 65 - 99 mg/dL  Glucose, capillary     Status: Abnormal   Collection Time: 12/27/16  4:52 PM  Result Value Ref Range   Glucose-Capillary 174 (H) 65 - 99 mg/dL  Glucose, capillary     Status: Abnormal   Collection Time: 12/27/16  9:49 PM  Result Value Ref Range   Glucose-Capillary 199 (H) 65 - 99 mg/dL  Glucose, capillary     Status: None   Collection Time: 12/28/16  6:33 AM  Result Value Ref Range   Glucose-Capillary 80 65 - 99 mg/dL  Glucose, capillary     Status: Abnormal   Collection Time: 12/28/16 11:21 AM  Result Value Ref Range   Glucose-Capillary 124 (H) 65 - 99 mg/dL   Comment 1 Notify RN   Glucose, capillary     Status: Abnormal   Collection Time: 12/28/16  4:19 PM   Result Value Ref Range   Glucose-Capillary 192 (H) 65 - 99 mg/dL  Glucose, capillary     Status: Abnormal   Collection Time: 12/28/16  9:28 PM  Result Value Ref Range   Glucose-Capillary 141 (H) 65 - 99 mg/dL  Glucose, capillary     Status: Abnormal   Collection Time: 12/29/16  6:47 AM  Result Value Ref Range   Glucose-Capillary 105 (H) 65 - 99 mg/dL     HEENT: normal Cardio: RRR Resp:CTA B with normal effort GI: BS positive and NT, ND Extremity:  No Edema Skin:   Honeycomb dressing, wound dry Neuro: Alert/Oriented, Abnormal Sensory Normal sensation in BUE, absent right L5 sensation to LT as well as right L3 and Abnormal Motor 5/5 in BUE, 3- Right knee ext ,0/5 Right ankle DF, LLE 3/5 KE 3- ADF, 3 L Hip ext--exam unchanged Musc/Skel:  Other no pain with UE or LE ROM, full AROM in BUE Gen NAD   Assessment/Plan: 1. 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 Physiatrist is providing close team supervision and 24 hour management of active medical problems listed below. Physiatrist and rehab team continue to assess barriers to discharge/monitor patient progress toward functional and  medical goals. FIM: Function - Bathing Position: Wheelchair/chair at sink Body parts bathed by patient: Left arm, Right arm, Front perineal area, Chest, Abdomen, Right upper leg, Left upper leg, Buttocks, Left lower leg, Right lower leg Body parts bathed by helper: Back Bathing not applicable: Left lower leg, Right lower leg Assist Level: Touching or steadying assistance(Pt > 75%)  Function- Upper Body Dressing/Undressing What is the patient wearing?: Orthosis, Bra, Pull over shirt/dress Bra - Perfomed by patient: Thread/unthread right bra strap, Thread/unthread left bra strap Bra - Perfomed by helper: Hook/unhook bra (pull down sports bra) Pull over shirt/dress - Perfomed by helper:  Thread/unthread right sleeve, Thread/unthread left sleeve, Put head through opening, Pull shirt over trunk Orthosis activity level: Performed by patient Assist Level: Set up Set up : To obtain clothing/put away Function - Lower Body Dressing/Undressing What is the patient wearing?: Pants, Maryln Manuel, Shoes Position: Wheelchair/chair at [employer] - Performed by patient: Pull underwear up/down, Thread/unthread left underwear leg, Thread/unthread right underwear leg Underwear - Performed by helper: Thread/unthread right underwear leg, Thread/unthread left underwear leg, Pull underwear up/down Pants- Performed by patient: Thread/unthread right pants leg, Thread/unthread left pants leg, Pull pants up/down Pants- Performed by helper: Thread/unthread right pants leg, Thread/unthread left pants leg, Pull pants up/down Non-skid slipper socks- Performed by patient: Don/doff right sock, Don/doff left sock Non-skid slipper socks- Performed by helper: Don/doff right sock, Don/doff left sock Socks - Performed by helper: Don/doff right sock, Don/doff left sock Shoes - Performed by patient: Don/doff left shoe Shoes - Performed by helper: Don/doff right shoe, Fasten right, Fasten left TED Hose - Performed by helper: Don/doff left TED hose, Don/doff right TED hose Assist for footwear: Supervision/touching assist Assist for lower body dressing: Assistive device, Touching or steadying assistance (Pt > 75%) Assistive Device Comment: sock-aid, reacher, shoe horn  Function - Toileting Toileting steps completed by patient: Performs perineal hygiene Toileting steps completed by helper: Adjust clothing prior to toileting, Adjust clothing after toileting Toileting Assistive Devices: Other (comment) (stedy, requires assist to pull up and down) Assist level: Touching or steadying assistance (Pt.75%)  Function - Toilet Transfers Toilet transfer assistive device: Elevated toilet seat/BSC over toilet, Drop arm  commode Mechanical lift: Stedy Assist level to toilet: Moderate assist (Pt 50 - 74%/lift or lower) Assist level from toilet: Moderate assist (Pt 50 - 74%/lift or lower)  Function - Chair/bed transfer Chair/bed transfer method: Stand pivot Chair/bed transfer assist level: Moderate assist (Pt 50 - 74%/lift or lower) Chair/bed transfer assistive device: Mechanical lift Mechanical lift: Stedy Chair/bed transfer details: Manual facilitation for weight shifting, Manual facilitation for placement, Verbal cues for safe use of DME/AE, Verbal cues for technique  Function - Locomotion: Wheelchair Will patient use wheelchair at discharge?: Yes Type: Manual Max wheelchair distance: 150 ft Assist Level: Supervision or verbal cues Assist Level: Supervision or verbal cues Assist Level: Supervision or verbal cues Turns around,maneuvers to table,bed, and toilet,negotiates 3% grade,maneuvers on rugs and over doorsills: No Function - Locomotion: Ambulation Ambulation activity did not occur: Safety/medical concerns Assistive device: Walker-rolling Max distance: 83 Assist level: Touching or steadying assistance (Pt > 75%) Walk 10 feet activity did not occur: Safety/medical concerns Assist level: Touching or steadying assistance (Pt > 75%) Walk 50 feet with 2 turns activity did not occur: Safety/medical concerns Assist level: Touching or steadying assistance (Pt > 75%) Walk 150 feet activity did not occur: Safety/medical concerns Walk 10 feet on uneven surfaces activity did not occur: Safety/medical concerns  Function - Comprehension Comprehension:  Auditory Comprehension assist level: Follows complex conversation/direction with extra time/assistive device  Function - Expression Expression: Verbal Expression assist level: Expresses complex ideas: With no assist  Function - Social Interaction Social Interaction assist level: Interacts appropriately with others with medication or extra time  (anti-anxiety, antidepressant).  Function - Problem Solving Problem solving assist level: Solves complex problems: With extra time  Function - Memory Memory assist level: Recognizes or recalls 90% of the time/requires cueing < 10% of the time Patient normally able to recall (first 3 days only): Current season, Location of own room, Staff names and faces, That he or she is in a hospital  Medical Problem List and Plan: 1. 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 Back brace when out of bed Continue CIR PT, OT,Team conference today please see physician documentation under team conference tab, met with team face-to-face to discuss problems,progress, and goals. Formulized individual treatment plan based on medical history, underlying problem and comorbidities. 2. DVT Prophylaxis/Anticoagulation: SCDs. Dopplers negative 3. Pain Management:OxyContin sustained release 20 mg every 12 hours Celebrex 200 mg every every 12 hours, Neurontin 300 mg 3 times a day, Robaxin 750 mg 4 times a day, Hydrocodone as needed 4. Mood: Provide emotional support 5. Neuropsych: This patient iscapable of making decisions on herown behalf. 6. Skin/Wound Care: Routine skin checks 7. Fluids/Electrolytes/Nutrition: encourage PO 8.Diabetes mellitus. Glucophage 500 mg twice a day. Check blood sugars before meals and at bedtime.   -cover with SSI while tapering steroids, sugars higher in PM  -decadron decr to 74m daily as of 4/16, CBG going down CBG (last 3)   Recent Labs  12/28/16 1619 12/28/16 2128 12/29/16 0647  GLUCAP 192* 141* 105*    9.Hypertension. Lisinopril 10 mg daily. Vitals:   12/28/16 1235 12/29/16 0610  BP: 121/64 (!) 149/72  Pulse: 67 68  Resp: 17 17  Temp: 98.1 F (36.7 C) 98 F (36.7 C)   10.Hyperlipidemia. Lipitor 11.Neurogenic bladder.   -success with  urecholine 10 mg 3 times a day trial, -ua  ucx >100K Kleb Sens to Cipro   -continue voiding trial, oob to void, IC prn 12.Constipation. Neurogenic bowel,  -senokot-s increased to 2 tablets twice a day  -last BM 4/16  -Soap suds enema multiple stools, Initiate Dulcolax suppository daily 13.GERD. Protonix  LOS (Days) 8 A FACE TO FACE EVALUATION WAS PERFORMED  Archit Leger E 12/29/2016, 9:44 AM

## 2016-12-30 ENCOUNTER — Inpatient Hospital Stay (HOSPITAL_COMMUNITY): Payer: BLUE CROSS/BLUE SHIELD | Admitting: Occupational Therapy

## 2016-12-30 ENCOUNTER — Inpatient Hospital Stay (HOSPITAL_COMMUNITY): Payer: BLUE CROSS/BLUE SHIELD | Admitting: Physical Therapy

## 2016-12-30 ENCOUNTER — Inpatient Hospital Stay (HOSPITAL_COMMUNITY): Payer: BLUE CROSS/BLUE SHIELD | Admitting: *Deleted

## 2016-12-30 LAB — GLUCOSE, CAPILLARY
GLUCOSE-CAPILLARY: 110 mg/dL — AB (ref 65–99)
GLUCOSE-CAPILLARY: 201 mg/dL — AB (ref 65–99)
Glucose-Capillary: 76 mg/dL (ref 65–99)
Glucose-Capillary: 172 mg/dL — ABNORMAL HIGH (ref 65–99)

## 2016-12-30 MED ORDER — METFORMIN HCL 500 MG PO TABS
500.0000 mg | ORAL_TABLET | Freq: Every day | ORAL | Status: DC
  Administered 2016-12-30 – 2017-01-04 (×6): 500 mg via ORAL
  Filled 2016-12-30 (×6): qty 1

## 2016-12-30 NOTE — Progress Notes (Signed)
Physical Therapy Session Note  Patient Details  Name: Megan [name] MRN: [id number] Date of Birth: September 13, 1951  Today's Date: 12/30/2016 PT Individual Time: 1600-1630 PT Individual Time Calculation (min): 30 min   Short Term Goals: Week 2:  PT Short Term Goal 1 (Week 2): =LTGs due to ELOS  Skilled Therapeutic Interventions/Progress Updates:    no c/o pain.  Session focus on kinesiotaping for R foot edema to improve AFO/shoe tolerance, reduce risk of skin breakdown, and increase pt independence with LB dressing.  PT applied 3 arrays of kinesiotape in a fluid dynamic pattern across dorsum of pt's foot.  Educated pt on signs of intolerance, precautions, and estimated length of wear.  Pt states she will have her husband help her assess when he arrives this evening and help her remove it if there were signs of intolerance or allergic reaction.  Positioned in w/c with call bell in reach and needs met.   Therapy Documentation Precautions:  Precautions Precautions: Back Precaution Booklet Issued: No Precaution Comments: reviewed back precautions with pt  Required Braces or Orthoses: Spinal Brace Spinal Brace: Thoracolumbosacral orthotic, Applied in sitting position Restrictions Weight Bearing Restrictions: No   See Function Navigator for Current Functional Status.   Therapy/Group: Individual Therapy  Earnest Conroy Penven-Crew 12/30/2016, 4:40 PM

## 2016-12-30 NOTE — Plan of Care (Signed)
Problem: RH Balance Goal: LTG Patient will maintain dynamic standing with ADLs (OT) LTG:  Patient will maintain dynamic standing balance with assist during activities of daily living (OT)   Upgraded 4/19-ESD  Problem: RH Bathing Goal: LTG Patient will bathe with assist, cues/equipment (OT) LTG: Patient will bathe specified number of body parts with assist with/without cues using equipment (position)  (OT)  Upgraded 4/19-ESD  Problem: RH Dressing Goal: LTG Patient will perform upper body dressing (OT) LTG Patient will perform upper body dressing with assist, with/without cues (OT).  Upgraded 4/19-ESD Goal: LTG Patient will perform lower body dressing w/assist (OT) LTG: Patient will perform lower body dressing with assist, with/without cues in positioning using equipment (OT)  Upgraded 4/19-ESD  Problem: RH Toileting Goal: LTG Patient will perform toileting w/assist, cues/equip (OT) LTG: Patient will perform toiletiing (clothes management/hygiene) with assist, with/without cues using equipment (OT)  Upgraded 4/19-ESD  Problem: RH Toilet Transfers Goal: LTG Patient will perform toilet transfers w/assist (OT) LTG: Patient will perform toilet transfers with assist, with/without cues using equipment (OT)  Upgraded 4/19-ESD  Comments: Upgraded 4/19-ESD

## 2016-12-30 NOTE — Progress Notes (Signed)
Physical Therapy Session Note  Patient Details  Name: Megan [name] MRN: [id number] Date of Birth: January 04, 1951  Today's Date: 12/30/2016 PT Individual Time: 2780 (63mn make-up)-1005 PT Individual Time Calculation (min): 60 min   Short Term Goals: Week 2:  PT Short Term Goal 1 (Week 2): =LTGs due to ELOS  Skilled Therapeutic Interventions/Progress Updates:    no c/o pain.  Session focus on ambulation, strengthening, and activity tolerance.    Pt propels w/c to and from therapy gym mod I for mobility and activity tolerance.  PT instructs pt in gait with PLS AFO for orthotics consult x80' with RW and close supervision.  Discussed AFO options and toe cap options with orthotist and patient.  Pt completes BLE therex x10 reps minisquats, towel squeeze, and hip abd with level 2 theraband.  Nustep with BLEs x12 minutes at level 3 for strengthening and reciprocal stepping pattern with pt providing her own tactile cues for neutral LE rotation.  Pt returned to room at end of session with needs met.   Therapy Documentation Precautions:  Precautions Precautions: Back Precaution Booklet Issued: No Precaution Comments: reviewed back precautions with pt  Required Braces or Orthoses: Spinal Brace Spinal Brace: Thoracolumbosacral orthotic, Applied in sitting position Restrictions Weight Bearing Restrictions: No   See Function Navigator for Current Functional Status.   Therapy/Group: Individual Therapy  Amory Simonetti E Penven-Crew 12/30/2016, 2:03 PM

## 2016-12-30 NOTE — Progress Notes (Signed)
Occupational Therapy Weekly Progress Note  Patient Details  Name: Megan [name] MRN: [id number] Date of Birth: March 02, 1951  Beginning of progress report period: December 22, 2016 End of progress report period: December 30, 2016   Today's Date: 12/30/2016  Session 1 OT Individual Time: 1100-1200 OT Individual Time Calculation (min): 60 min   Session 2 OT Individual Time: 1100-1200 OT Individual Time Calculation (min): 60 min    Patient has met 4 of 4 short term goals.  Pt has surpassed her short term goals and is making great progress towards her long-tern goals. She is able to complete LB dressing + sit<>stand, don brace, and complete functional transfers at overall min A level using adapted strategies. Goals have been upgraded 2/2 progress. Continue plan of care.   Patient continues to demonstrate the following deficits: muscle weakness and decreased standing balance and decreased balance strategies and therefore will continue to benefit from skilled OT intervention to enhance overall performance with BADL.  Patient progressing toward long term goals..  Continue plan of care.  OT Short Term Goals Week 1:  OT Short Term Goal 1 (Week 1): Pt will perform toilet transfer with mod A in order to decrease level of assistance. OT Short Term Goal 1 - Progress (Week 1): Met OT Short Term Goal 2 (Week 1): Pt will maintain standing balance for 2 minutes during LB clothing management and hygiene with min A. OT Short Term Goal 2 - Progress (Week 1): Met OT Short Term Goal 3 (Week 1): Pt will perform LB dressing with mod A in order to decrease level of assistance. OT Short Term Goal 3 - Progress (Week 1): Met OT Short Term Goal 4 (Week 1): Pt will engage in 10 minutes of functional task with 2 rest breaks or less.  OT Short Term Goal 4 - Progress (Week 1): Met Week 2:  OT Short Term Goal 2 (Week 2): LTG=STG 2/2 estimated LOS  Skilled Therapeutic Interventions/Progress Updates:  Session 1   OT  treatment session focused on shower transfers, modified bathing/dressing, functional ambulation, and improved sit><>stand. Pt gathered clothing w/c level, then ambulated to shower chair with min guard A and VC for safe technique to transfer into shower. Brace doffed once transferred onto shower chair, and pt completed bathing with set-up A using long-handled sponge. Pt dried upper body and donned UB clothing, then back brace prior to squat-pivot transfer onto w/c with set-up and supervision. Propelled wc to sink where pt utilized long-handled reacher to don underwear and pants with overall min A sit<>stand and min A for balance when pulling pants over hips. Redness noted on heel of R LE, possibly from AFO, RN notified and placed padding on heel. Shoes and AFO left off. Educated pt on modified strategy for donning TED hose and pt able to achieve figure 4 position with min A to hold LE while donning TED hose. Pt left seated in wc at end of session with needs met.   Session 2 OT treatment session focused on wc mobility within community environment and LB there-ex. Pt propelled w/c to elevators and OT educated pt on safe technique for elevator access. Pt then propelled wc on uneven surface to outdoor sitting area. Pt participated in LB there-ex including hip abduction/adduction using level 1 yellow thera-band, seated hip flexion, ankle flex/ext, and knee extension 10 x 2. Pt returned to room and left with needs met.   Therapy Documentation Precautions:  Precautions Precautions: Back Precaution Booklet Issued: No Precaution Comments: reviewed  back precautions with pt  Required Braces or Orthoses: Spinal Brace Spinal Brace: Thoracolumbosacral orthotic, Applied in sitting position Restrictions Weight Bearing Restrictions: No Pain: Pain Assessment Pain Assessment: 0-10 Pain Score: 2  Pain Type: Acute pain Pain Location: Back Pain Orientation: Mid Pain Descriptors / Indicators: Aching Pain Onset:  On-going Pain Intervention(s): Repositioned  See Function Navigator for Current Functional Status.   Therapy/Group: Individual Therapy  [name] 12/30/2016, 12:32 PM

## 2016-12-30 NOTE — Progress Notes (Signed)
Subjective/Complaints:     ROS: pt denies nausea, vomiting,cough, shortness of breath or chest pain   Objective: Vital Signs: Blood pressure 121/78, pulse 64, temperature 97.7 F (36.5 C), temperature source Oral, resp. rate 16, weight 78.4 kg (172 lb 13.5 oz), SpO2 98 %. No results found. Results for orders placed or performed during the hospital encounter of 12/21/16 (from the past 72 hour(s))  Glucose, capillary     Status: Abnormal   Collection Time: 12/27/16 12:13 PM  Result Value Ref Range   Glucose-Capillary 123 (H) 65 - 99 mg/dL  Glucose, capillary     Status: Abnormal   Collection Time: 12/27/16  4:52 PM  Result Value Ref Range   Glucose-Capillary 174 (H) 65 - 99 mg/dL  Glucose, capillary     Status: Abnormal   Collection Time: 12/27/16  9:49 PM  Result Value Ref Range   Glucose-Capillary 199 (H) 65 - 99 mg/dL  Glucose, capillary     Status: None   Collection Time: 12/28/16  6:33 AM  Result Value Ref Range   Glucose-Capillary 80 65 - 99 mg/dL  Glucose, capillary     Status: Abnormal   Collection Time: 12/28/16 11:21 AM  Result Value Ref Range   Glucose-Capillary 124 (H) 65 - 99 mg/dL   Comment 1 Notify RN   Glucose, capillary     Status: Abnormal   Collection Time: 12/28/16  4:19 PM  Result Value Ref Range   Glucose-Capillary 192 (H) 65 - 99 mg/dL  Glucose, capillary     Status: Abnormal   Collection Time: 12/28/16  9:28 PM  Result Value Ref Range   Glucose-Capillary 141 (H) 65 - 99 mg/dL  Glucose, capillary     Status: Abnormal   Collection Time: 12/29/16  6:47 AM  Result Value Ref Range   Glucose-Capillary 105 (H) 65 - 99 mg/dL  Glucose, capillary     Status: Abnormal   Collection Time: 12/29/16 11:17 AM  Result Value Ref Range   Glucose-Capillary 172 (H) 65 - 99 mg/dL   Comment 1 Notify RN   Glucose, capillary     Status: Abnormal   Collection Time: 12/29/16  4:29 PM  Result Value Ref Range   Glucose-Capillary 198 (H) 65 - 99 mg/dL  Glucose,  capillary     Status: Abnormal   Collection Time: 12/29/16  9:05 PM  Result Value Ref Range   Glucose-Capillary 193 (H) 65 - 99 mg/dL  Glucose, capillary     Status: None   Collection Time: 12/30/16  6:33 AM  Result Value Ref Range   Glucose-Capillary 76 65 - 99 mg/dL     HEENT: normal Cardio: RRR Resp:CTA B with normal effort GI: BS positive and NT, ND Extremity:  No Edema Skin:   Honeycomb dressing removed Incision with some dried blood no drainage, erythema or fluctuance Neuro: Alert/Oriented, Abnormal Sensory Normal sensation in BUE, absent right L5 sensation to LT as well as right L3 and Abnormal Motor 5/5 in BUE, 3- Right knee ext ,0/5 Right ankle DF, LLE 3/5 KE 3- ADF, 3 L Hip ext--exam unchanged Musc/Skel:  Other no pain with UE or LE ROM, full AROM in BUE Gen NAD   Assessment/Plan: 1. 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 Physiatrist is providing close team supervision and 24 hour management of active medical problems listed below. Physiatrist and rehab team continue to assess barriers to discharge/monitor patient progress toward  functional and medical goals. FIM: Function - Bathing Position: Wheelchair/chair at sink Body parts bathed by patient: Left arm, Right arm, Front perineal area, Chest, Abdomen, Right upper leg, Left upper leg, Buttocks, Left lower leg, Right lower leg Body parts bathed by helper: Back Bathing not applicable: Left lower leg, Right lower leg Assist Level: Touching or steadying assistance(Pt > 75%)  Function- Upper Body Dressing/Undressing What is the patient wearing?: Orthosis, Bra, Pull over shirt/dress Bra - Perfomed by patient: Thread/unthread right bra strap, Thread/unthread left bra strap Bra - Perfomed by helper: Hook/unhook bra (pull down sports bra) Pull over shirt/dress - Perfomed by helper: Thread/unthread right sleeve,  Thread/unthread left sleeve, Put head through opening, Pull shirt over trunk Orthosis activity level: Performed by patient Assist Level: Set up Set up : To obtain clothing/put away Function - Lower Body Dressing/Undressing What is the patient wearing?: Pants, Maryln Manuel, Shoes Position: Wheelchair/chair at [employer] - Performed by patient: Pull underwear up/down, Thread/unthread left underwear leg, Thread/unthread right underwear leg Underwear - Performed by helper: Thread/unthread right underwear leg, Thread/unthread left underwear leg, Pull underwear up/down Pants- Performed by patient: Thread/unthread right pants leg, Thread/unthread left pants leg, Pull pants up/down Pants- Performed by helper: Thread/unthread right pants leg, Thread/unthread left pants leg, Pull pants up/down Non-skid slipper socks- Performed by patient: Don/doff right sock, Don/doff left sock Non-skid slipper socks- Performed by helper: Don/doff right sock, Don/doff left sock Socks - Performed by helper: Don/doff right sock, Don/doff left sock Shoes - Performed by patient: Don/doff left shoe Shoes - Performed by helper: Don/doff right shoe, Fasten right, Fasten left TED Hose - Performed by helper: Don/doff left TED hose, Don/doff right TED hose Assist for footwear: Supervision/touching assist Assist for lower body dressing: Assistive device, Touching or steadying assistance (Pt > 75%) Assistive Device Comment: sock-aid, reacher, shoe horn  Function - Toileting Toileting steps completed by patient: Performs perineal hygiene Toileting steps completed by helper: Adjust clothing prior to toileting, Adjust clothing after toileting Toileting Assistive Devices: Other (comment) (stedy, requires assist to pull up and down) Assist level: Touching or steadying assistance (Pt.75%)  Function - Toilet Transfers Toilet transfer assistive device: Elevated toilet seat/BSC over toilet, Drop arm commode Mechanical lift:  Stedy Assist level to toilet: Moderate assist (Pt 50 - 74%/lift or lower) Assist level from toilet: Moderate assist (Pt 50 - 74%/lift or lower)  Function - Chair/bed transfer Chair/bed transfer method: Stand pivot Chair/bed transfer assist level: Supervision or verbal cues Chair/bed transfer assistive device: Mechanical lift Mechanical lift: Stedy Chair/bed transfer details: Verbal cues for precautions/safety, Tactile cues for posture, Verbal cues for technique  Function - Locomotion: Wheelchair Will patient use wheelchair at discharge?: Yes Type: Manual Max wheelchair distance: 120 ft Assist Level: No help, No cues, assistive device, takes more than reasonable amount of time Assist Level: No help, No cues, assistive device, takes more than reasonable amount of time Assist Level: No help, No cues, assistive device, takes more than reasonable amount of time Turns around,maneuvers to table,bed, and toilet,negotiates 3% grade,maneuvers on rugs and over doorsills: Yes Function - Locomotion: Ambulation Ambulation activity did not occur: Safety/medical concerns Assistive device: Walker-rolling Max distance: 85 ft Assist level: Touching or steadying assistance (Pt > 75%) Walk 10 feet activity did not occur: Safety/medical concerns Assist level: Touching or steadying assistance (Pt > 75%) Walk 50 feet with 2 turns activity did not occur: Safety/medical concerns Assist level: Touching or steadying assistance (Pt > 75%) Walk 150 feet activity did not occur: Safety/medical  concerns Walk 10 feet on uneven surfaces activity did not occur: Safety/medical concerns  Function - Comprehension Comprehension: Auditory Comprehension assist level: Follows complex conversation/direction with extra time/assistive device  Function - Expression Expression: Verbal Expression assist level: Expresses complex ideas: With no assist  Function - Social Interaction Social Interaction assist level: Interacts  appropriately with others with medication or extra time (anti-anxiety, antidepressant).  Function - Problem Solving Problem solving assist level: Solves complex problems: With extra time  Function - Memory Memory assist level: Recognizes or recalls 90% of the time/requires cueing < 10% of the time Patient normally able to recall (first 3 days only): Current season, Location of own room, Staff names and faces, That he or she is in a hospital  Medical Problem List and Plan: 1. 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 Back brace when out of bed Continue CIR PT, OT,2. DVT Prophylaxis/Anticoagulation: SCDs. Dopplers negative 3. Pain Management:OxyContin sustained release 20 mg every 12 hours Celebrex 200 mg every every 12 hours, Neurontin 300 mg 3 times a day, Robaxin 750 mg 4 times a day, Hydrocodone as needed 4. Mood: Provide emotional support 5. Neuropsych: This patient iscapable of making decisions on herown behalf. 6. Skin/Wound Care: Routine skin checks 7. Fluids/Electrolytes/Nutrition: encourage PO 8.Diabetes mellitus. Glucophage 500 mg twice a day. Check blood sugars before meals and at bedtime.   -cover with SSI while tapering steroids, sugars higher in PM  -decadron decr to '6mg'$  daily as of 4/16, CBG going down, change metformin to qam CBG (last 3)   Recent Labs  12/29/16 1629 12/29/16 2105 12/30/16 0633  GLUCAP 198* 193* 76    9.Hypertension. Lisinopril 10 mg daily. Vitals:   12/29/16 0610 12/30/16 0545  BP: (!) 149/72 121/78  Pulse: 68 64  Resp: 17 16  Temp: 98 F (36.7 C) 97.7 F (36.5 C)   10.Hyperlipidemia. Lipitor 11.Neurogenic bladder.   -success with urecholine 10 mg 3 times a day trial, -ua  ucx >100K Kleb Sens to Cipro 7 d course  -continue voiding trial, oob to void, IC prn 12.Constipation. Neurogenic  bowel,  -senokot-s increased to 2 tablets twice a day  -last BM 4/16  -Soap suds enema multiple stools, Initiate Dulcolax suppository daily 13.GERD. Protonix  LOS (Days) 9 A FACE TO FACE EVALUATION WAS PERFORMED  Derian Dimalanta E 12/30/2016, 7:19 AM

## 2016-12-30 NOTE — Progress Notes (Signed)
Physical Therapy Session Note  Patient Details  Name: Megan [name] MRN: [id number] Date of Birth: 18-Mar-1951  Today's Date: 12/30/2016 PT Individual Time: 0905 -5056 (38mn make-up) PT Individual Time Calculation (min): 60 min   Short Term Goals: Week 1:  PT Short Term Goal 1 (Week 1): Pt to perfrorm bed<>w/c transfers with min assist PT Short Term Goal 1 - Progress (Week 1): Met PT Short Term Goal 2 (Week 1): Pt to tolerate static standing X10 minutes with LRAD PT Short Term Goal 2 - Progress (Week 1): Progressing toward goal PT Short Term Goal 3 (Week 1): Pt to perform static sitting balance X 10 minutes.  PT Short Term Goal 3 - Progress (Week 1): Met PT Short Term Goal 4 (Week 1): Pt to perform supine<>sitting with supervision using logroll technique.  PT Short Term Goal 4 - Progress (Week 1): Met PT Short Term Goal 5 (Week 1): Pt to don/doff TLSO with supervision in sitting.  PT Short Term Goal 5 - Progress (Week 1): Partly met Week 2:  PT Short Term Goal 1 (Week 2): =LTGs due to ELOS  Skilled Therapeutic Interventions/Progress Updates:  Tx focused on strengthening, functional mobility traiing, and gait with RW.  Supine therex for functional strengthening: ankle pumps (AAROM RLE), heel slides with AAROM, hip ABD/ADD with AAROM, quad/glute sets, and passive HS stretch 2x175m  Seated LAQ, manually resisted HS curls, and marching with AAROM x10 each  RLE notable for weakness>LLE. Pt encouraged to continue with attempts despite difficulty.   Supine>sit with elevated HOB with S cues for rolling. Set-up assist and tightening cues for donning TLSO. Sit<>stand x3 throughout tx with cues for using RLE as able for functional strengthening. Cues for weight-shifting for forced use of RLE.   Gait in room 1x20' and 1x50' with RW and close S with postural cues. AFO and shoes donned.    Pt stood at sink with 1/2 UE support x2m75mwith close S.  [facility] needs in reach.        Therapy Documentation Precautions:  Precautions Precautions: Back Precaution Booklet Issued: No Precaution Comments: reviewed back precautions with pt  Required Braces or Orthoses: Spinal Brace Spinal Brace: Thoracolumbosacral orthotic, Applied in sitting position Restrictions Weight Bearing Restrictions: No    Pain: none    See Function Navigator for Current Functional Status.   Therapy/Group: Individual Therapy  ColKennieth RadT, DPT  KAMDwaine DeterOLWashington19/2018, 9:50 AM

## 2016-12-31 ENCOUNTER — Inpatient Hospital Stay (HOSPITAL_COMMUNITY): Payer: BLUE CROSS/BLUE SHIELD | Admitting: Physical Therapy

## 2016-12-31 ENCOUNTER — Inpatient Hospital Stay (HOSPITAL_COMMUNITY): Payer: BLUE CROSS/BLUE SHIELD | Admitting: Occupational Therapy

## 2016-12-31 LAB — GLUCOSE, CAPILLARY
GLUCOSE-CAPILLARY: 66 mg/dL (ref 65–99)
GLUCOSE-CAPILLARY: 153 mg/dL — AB (ref 65–99)
GLUCOSE-CAPILLARY: 148 mg/dL — AB (ref 65–99)
Glucose-Capillary: 109 mg/dL — ABNORMAL HIGH (ref 65–99)
Glucose-Capillary: 186 mg/dL — ABNORMAL HIGH (ref 65–99)

## 2016-12-31 MED ORDER — DEXAMETHASONE 4 MG PO TABS
4.0000 mg | ORAL_TABLET | Freq: Every day | ORAL | Status: DC
  Administered 2016-12-31 – 2017-01-04 (×5): 4 mg via ORAL
  Filled 2016-12-31 (×5): qty 1

## 2016-12-31 NOTE — Patient Care Conference (Signed)
Inpatient RehabilitationTeam Conference and Plan of Care Update Date: 12/29/2016   Time: 11:05 AM    Patient Name: Megan [name]      Medical Record Number: [id number] Date of Birth: March 06, 1951 Sex: Female         Room/Bed: 4M08C/4M08C-01 Payor Info: Payor: BLUE CROSS BLUE [employer]IELD / Plan: BCBS OTHER / Product Type: *No Product type* /    Admitting Diagnosis: Thoracic Myelopathy  Admit Date/Time:  12/21/2016  6:31 PM Admission Comments: No comment available   Primary Diagnosis:  <principal problem not specified> Principal Problem: <principal problem not specified>  Patient Active Problem List   Diagnosis Date Noted  . Neurogenic bladder 12/27/2016  . 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/27/2016  . Myelopathy ([place]) 12/21/2016  . Surgery, elective   . Diabetes mellitus type 2 in nonobese (HCC)   . History of lumbar fusion   . History of fusion of cervical spine   . Neuropathic pain   . Post-operative pain   . 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 Paraparesis ([place]) 12/15/2016  . Pedal edema 12/14/2016  . Type 2 diabetes mellitus without complication, without long-term current use of insulin ([place]) 11/02/2016  . Hyperlipidemia 11/02/2016  . Essential hypertension 11/02/2016  . 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    Expected Discharge Date: Expected Discharge Date: 01/04/17  Team Members Present: Physician leading conference: Dr. Alysia Penna Social Worker Present: Alfonse Alpers, LCSW Nurse Present: Rayetta Pigg, RN PT Present: Dwyane Dee, PT OT Present: Cherylynn Ridges, OT SLP Present: Weston Anna, SLP PPS Coordinator present : Daiva Nakayama, RN, CRRN     Current Status/Progress Goal Weekly Team Focus  Medical   voiding!, constipation improved, Right foot drop  maintain bowel and bladder cont  orthotic management   Bowel/Bladder   Occasional incontinent urine.  Urecholine  D/Ced.   Continent bowel and bladder.  Timed toileting.   Swallow/Nutrition/ Hydration             ADL's   Min A/supervision overall  Mod I/supervision overall  ADL training, standing balance/endurance, functional mobility   Mobility   min assist overall  mod I overall, supervision for gait   gait training, activity tolerance, Le strengthening, NMR   Communication             Safety/Cognition/ Behavioral Observations            Pain   MAnaged with scheduled meds.  MAnaged at goal 2/10  Monitor   Skin   Incision line CDI. Ecchymosis. Bil. pedal edema. Lt.> rt.  No s/s infection.  Healing process at D/C.  Monitor    Rehab Goals Patient on target to meet rehab goals: Yes Rehab Goals Revised: some of pt's golas have been upgraded to supervision due to pt's increased progress. *See Care Plan and progress notes for long and short-term goals.  Barriers to Discharge: still establishing bowel and bladder    Possible Resolutions to Barriers:  rehab RN to work on above, order orthotics    Discharge Planning/Teaching Needs:  Pt to go to her home with her husband, dtr, and friend to provide supervision.  Pt's husband is present often, but can participate in family education closer to d/c.   Team Discussion:  Pt is voiding now and AFO on right side requested by PT and MD ordered it.  Pt is also permitted to shower.  Pt walked with PT 63' on Tuesday and gait goals upgraded to supervision.  Pt is min A to supervision for ADLs, with mod I to S goals.  Pt's honeycomb dressing can be d/c'd and incision looks good.  Revisions to Treatment Plan:  none   Continued Need for Acute Rehabilitation Level of Care: The patient requires daily medical management by a physician with specialized training in physical medicine and rehabilitation for the following conditions: Daily direction of a multidisciplinary physical rehabilitation program to ensure safe treatment while eliciting the highest outcome that  is of practical value to the patient.: Yes Daily medical management of patient stability for increased activity during participation in an intensive rehabilitation regime.: Yes Daily analysis of laboratory values and/or radiology reports with any subsequent need for medication adjustment of medical intervention for : Neurological problems;Mood/behavior problems  Ahmadou Bolz, Silvestre Mesi 12/31/2016, 2:41 PM

## 2016-12-31 NOTE — Significant Event (Signed)
Hypoglycemic Event  CBG: 76  Treatment: 15 GM carbohydrate snack  Symptoms: None  Follow-up CBG: Time:652 CBG Result:109  Possible Reasons for Event: Unknown  Comments/MD notified:Kirstein MD    Braulio Conte, Lash Matulich L

## 2016-12-31 NOTE — Progress Notes (Signed)
Physical Therapy Session Note  Patient Details  Name: Megan [name] MRN: [id number] Date of Birth: 01/24/1951  Today's Date: 12/31/2016 PT Individual Time: 0830-0944 PT Individual Time Calculation (min): 74 min   Short Term Goals: Week 2:  PT Short Term Goal 1 (Week 2): =LTGs due to ELOS  Skilled Therapeutic Interventions/Progress Updates:    no c/o pain.  Session focus on family education with pt's husband, Megan [name], for car and basic transfers, gait, and stair negotiation.    Pt completes lower body dressing from w/c level with set up assist, except total assist for donning TEDs.  W/c propulsion throughout unit mod I.  PT instructs pt in BLE therex with rail for support x10-15 reps for minisquats, standing hip abd, and hamstring curls with min verbal cues for technique.  Kinetron from seated position x5 minutes at 40 cm/s for NMR and strengthening.    PT instructed pt and Megan [name] in car transfer (x2) with supervision using squat/pivot and RW to stand/pivot.  On squat/pivot pt used UEs to pull self up with car frame and Megan [name]'s arm.  Educated pt on not falling into old practices now that her LEs were getting stronger and she verbalized understanding.  Pt completes 4 steps (3" height) with PT demonstrating providing supervision/steady assist, pt's knees buckled 2/2 fatigue and she required total assist to regain balance/stance, but she was then able to descend steps backward with no more than min assist.  Megan [name] provided supervision for pt to ambulate up to 50' with RW with no cues from PT.  Discussed activity tolerance and progression at home as well as improvement in gait pattern and decreased fatigue with use of AFO/toe cap (which had not arrived at the time of this session).  Pt returned to room at end of session and positioned upright in w/c with call bell in reach and needs met.   Therapy Documentation Precautions:  Precautions Precautions: Back Precaution Booklet Issued:  No Precaution Comments: reviewed back precautions with pt  Required Braces or Orthoses: Spinal Brace Spinal Brace: Thoracolumbosacral orthotic, Applied in sitting position Restrictions Weight Bearing Restrictions: No   See Function Navigator for Current Functional Status.   Therapy/Group: Individual Therapy  Caitlin E Penven-Crew 12/31/2016, 9:45 AM  

## 2016-12-31 NOTE — Progress Notes (Signed)
Social Work Patient ID: Megan [name], female   DOB: 1951-03-20, 66 y.o.   MRN: [id number]   CSW met with pt on 12-29-16 to update her on team conference discussion and targeted d/c date of 01-04-17.  Pt is pleased with this.  Pt's husband was not in the room at time of CSW's visit and CSW keeps missing him.  Will watch for him in room or call him.  Pt, in the meantime, will relay the information on to him.  She is pleased with her progress and looks forward to returning home.  CSW will assist pt with arranging [place], but she has all needed DME at home from previous admissions.  CSW will continue to follow and assist as needed.

## 2016-12-31 NOTE — Progress Notes (Signed)
Subjective/Complaints:   Low CBG per RN today  ROS: pt denies nausea, vomiting,cough, shortness of breath or chest pain   Objective: Vital Signs: Blood pressure (!) 143/71, pulse 65, temperature 97.8 F (36.6 C), temperature source Oral, resp. rate 16, weight 78.4 kg (172 lb 13.5 oz), SpO2 98 %. No results found. Results for orders placed or performed during the hospital encounter of 12/21/16 (from the past 72 hour(s))  Glucose, capillary     Status: Abnormal   Collection Time: 12/28/16 11:21 AM  Result Value Ref Range   Glucose-Capillary 124 (H) 65 - 99 mg/dL   Comment 1 Notify RN   Glucose, capillary     Status: Abnormal   Collection Time: 12/28/16  4:19 PM  Result Value Ref Range   Glucose-Capillary 192 (H) 65 - 99 mg/dL  Glucose, capillary     Status: Abnormal   Collection Time: 12/28/16  9:28 PM  Result Value Ref Range   Glucose-Capillary 141 (H) 65 - 99 mg/dL  Glucose, capillary     Status: Abnormal   Collection Time: 12/29/16  6:47 AM  Result Value Ref Range   Glucose-Capillary 105 (H) 65 - 99 mg/dL  Glucose, capillary     Status: Abnormal   Collection Time: 12/29/16 11:17 AM  Result Value Ref Range   Glucose-Capillary 172 (H) 65 - 99 mg/dL   Comment 1 Notify RN   Glucose, capillary     Status: Abnormal   Collection Time: 12/29/16  4:29 PM  Result Value Ref Range   Glucose-Capillary 198 (H) 65 - 99 mg/dL  Glucose, capillary     Status: Abnormal   Collection Time: 12/29/16  9:05 PM  Result Value Ref Range   Glucose-Capillary 193 (H) 65 - 99 mg/dL  Glucose, capillary     Status: None   Collection Time: 12/30/16  6:33 AM  Result Value Ref Range   Glucose-Capillary 76 65 - 99 mg/dL  Glucose, capillary     Status: Abnormal   Collection Time: 12/30/16 12:12 PM  Result Value Ref Range   Glucose-Capillary 110 (H) 65 - 99 mg/dL  Glucose, capillary     Status: Abnormal   Collection Time: 12/30/16  4:43 PM  Result Value Ref Range   Glucose-Capillary 201 (H) 65 -  99 mg/dL  Glucose, capillary     Status: Abnormal   Collection Time: 12/30/16  8:57 PM  Result Value Ref Range   Glucose-Capillary 172 (H) 65 - 99 mg/dL  Glucose, capillary     Status: None   Collection Time: 12/31/16  6:37 AM  Result Value Ref Range   Glucose-Capillary 66 65 - 99 mg/dL  Glucose, capillary     Status: Abnormal   Collection Time: 12/31/16  6:52 AM  Result Value Ref Range   Glucose-Capillary 109 (H) 65 - 99 mg/dL     HEENT: normal Cardio: RRR Resp:CTA B with normal effort GI: BS positive and NT, ND Extremity:  No Edema Skin:   Honeycomb dressing removed Incision with some dried blood no drainage, erythema or fluctuance Neuro: Alert/Oriented, Abnormal Sensory Normal sensation in BUE, absent right L5 sensation to LT as well as right L3 and Abnormal Motor 5/5 in BUE, 3- Right knee ext ,0/5 Right ankle DF, LLE 3/5 KE 3- ADF, 3 L Hip ext--exam unchanged Musc/Skel:  Other no pain with UE or LE ROM, full AROM in BUE Gen NAD   Assessment/Plan: 1. Functional deficits secondary to paraparesis which require 3+ hours per day of interdisciplinary  therapy in a comprehensive inpatient rehab setting. Physiatrist is providing close team supervision and 24 hour management of active medical problems listed below. Physiatrist and rehab team continue to assess barriers to discharge/monitor patient progress toward functional and medical goals. FIM: Function - Bathing Position: Wheelchair/chair at sink Body parts bathed by patient: Left arm, Right arm, Front perineal area, Chest, Abdomen, Right upper leg, Left upper leg, Buttocks, Left lower leg, Right lower leg Body parts bathed by helper: Back Bathing not applicable: Left lower leg, Right lower leg Assist Level: Touching or steadying assistance(Pt > 75%)  Function- Upper Body Dressing/Undressing What is the patient wearing?: Orthosis, Bra, Pull over shirt/dress Bra - Perfomed by patient: Thread/unthread right bra strap,  Thread/unthread left bra strap Bra - Perfomed by helper: Hook/unhook bra (pull down sports bra) Pull over shirt/dress - Perfomed by helper: Thread/unthread right sleeve, Thread/unthread left sleeve, Put head through opening, Pull shirt over trunk Orthosis activity level: Performed by patient Assist Level: Set up Set up : To obtain clothing/put away Function - Lower Body Dressing/Undressing What is the patient wearing?: Pants, Maryln Manuel, Shoes Position: Wheelchair/chair at [employer] - Performed by patient: Pull underwear up/down, Thread/unthread left underwear leg, Thread/unthread right underwear leg Underwear - Performed by helper: Thread/unthread right underwear leg, Thread/unthread left underwear leg, Pull underwear up/down Pants- Performed by patient: Thread/unthread right pants leg, Thread/unthread left pants leg, Pull pants up/down Pants- Performed by helper: Thread/unthread right pants leg, Thread/unthread left pants leg, Pull pants up/down Non-skid slipper socks- Performed by patient: Don/doff right sock, Don/doff left sock Non-skid slipper socks- Performed by helper: Don/doff right sock, Don/doff left sock Socks - Performed by helper: Don/doff right sock, Don/doff left sock Shoes - Performed by patient: Don/doff left shoe Shoes - Performed by helper: Don/doff right shoe, Fasten right, Fasten left TED Hose - Performed by helper: Don/doff left TED hose, Don/doff right TED hose Assist for footwear: Supervision/touching assist Assist for lower body dressing: Assistive device, Touching or steadying assistance (Pt > 75%) Assistive Device Comment: sock-aid, reacher, shoe horn  Function - Toileting Toileting steps completed by patient: Performs perineal hygiene Toileting steps completed by helper: Adjust clothing prior to toileting, Adjust clothing after toileting Toileting Assistive Devices: Other (comment) (stedy, requires assist to pull up and down) Assist level: Touching or  steadying assistance (Pt.75%)  Function - Toilet Transfers Toilet transfer assistive device: Elevated toilet seat/BSC over toilet, Drop arm commode Mechanical lift: Stedy Assist level to toilet: Moderate assist (Pt 50 - 74%/lift or lower) Assist level from toilet: Moderate assist (Pt 50 - 74%/lift or lower)  Function - Chair/bed transfer Chair/bed transfer method: Stand pivot Chair/bed transfer assist level: Supervision or verbal cues Chair/bed transfer assistive device: Mechanical lift Mechanical lift: Stedy Chair/bed transfer details: Verbal cues for precautions/safety, Tactile cues for posture, Verbal cues for technique  Function - Locomotion: Wheelchair Will patient use wheelchair at discharge?: Yes Type: Manual Max wheelchair distance: 120 ft Assist Level: No help, No cues, assistive device, takes more than reasonable amount of time Assist Level: No help, No cues, assistive device, takes more than reasonable amount of time Assist Level: No help, No cues, assistive device, takes more than reasonable amount of time Turns around,maneuvers to table,bed, and toilet,negotiates 3% grade,maneuvers on rugs and over doorsills: Yes Function - Locomotion: Ambulation Ambulation activity did not occur: Safety/medical concerns Assistive device: Walker-rolling Max distance: 85 ft Assist level: Touching or steadying assistance (Pt > 75%) Walk 10 feet activity did not occur: Safety/medical concerns Assist level:  Touching or steadying assistance (Pt > 75%) Walk 50 feet with 2 turns activity did not occur: Safety/medical concerns Assist level: Touching or steadying assistance (Pt > 75%) Walk 150 feet activity did not occur: Safety/medical concerns Walk 10 feet on uneven surfaces activity did not occur: Safety/medical concerns  Function - Comprehension Comprehension: Auditory Comprehension assist level: Follows complex conversation/direction with extra time/assistive device  Function -  Expression Expression: Verbal Expression assist level: Expresses complex ideas: With no assist  Function - Social Interaction Social Interaction assist level: Interacts appropriately with others with medication or extra time (anti-anxiety, antidepressant).  Function - Problem Solving Problem solving assist level: Solves complex problems: With extra time  Function - Memory Memory assist level: Recognizes or recalls 90% of the time/requires cueing < 10% of the time Patient normally able to recall (first 3 days only): Current season, Location of own room, Staff names and faces, That he or she is in a hospital  Medical Problem List and Plan: 1. 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 Back brace when out of bed Continue CIR PT, OT,2. DVT Prophylaxis/Anticoagulation: SCDs. Dopplers negative 3. Pain Management:OxyContin sustained release 20 mg every 12 hours Celebrex 200 mg every every 12 hours, Neurontin 300 mg 3 times a day, Robaxin 750 mg 4 times a day, Hydrocodone as needed 4. Mood: Provide emotional support 5. Neuropsych: This patient iscapable of making decisions on herown behalf. 6. Skin/Wound Care: Routine skin checks 7. Fluids/Electrolytes/Nutrition: encourage PO 8.Diabetes mellitus. Glucophage 500 mg twice a day. Check blood sugars before meals and at bedtime.   -cover with SSI while tapering steroids, sugars higher in PM  -decadron decr to '6mg'$  daily as of 4/16, CBG going down, change metformin to qam, ask pt to have hs snack, cont to wean decadron to '4mg'$  CBG (last 3)   Recent Labs  12/30/16 2057 12/31/16 0637 12/31/16 0652  GLUCAP 172* 66 109*    9.Hypertension. Lisinopril 10 mg daily. Vitals:   12/30/16 1501 12/31/16 0517  BP: (!) 107/53 (!) 143/71  Pulse: 80 65  Resp: 18 16  Temp: 98.8 F (37.1 C) 97.8 F (36.6 C)    10.Hyperlipidemia. Lipitor 11.Neurogenic bladder.   -success with urecholine 25 mg 3 times a day trial now with incont d/c, -ua  ucx >100K Kleb Sens to Cipro 7 d course   12.Constipation. Neurogenic bowel,  -senokot-s increased to 2 tablets twice a day  -last BM 4/16  -Dulcolax suppository daily 13.GERD. Protonix  LOS (Days) 10 A FACE TO FACE EVALUATION WAS PERFORMED  KIRSTEINS,ANDREW E 12/31/2016, 6:55 AM

## 2016-12-31 NOTE — Progress Notes (Signed)
Occupational Therapy Session Note  Patient Details  Name: Megan [name] MRN: [id number] Date of Birth: 1950/12/31  Today's Date: 12/31/2016  Session 1 OT Individual Time: 1103-1200 OT Individual Time Calculation (min): 57 min   Session 2 OT Individual Time: 1500-1531 OT Individual Time Calculation (min): 31 min   Short Term Goals: Week 2:  OT Short Term Goal 2 (Week 2): LTG=STG 2/2 estimated LOS  Skilled Therapeutic Interventions/Progress Updates:  Session 1   OT treatment session focused on LB dressing techniques with AFO, transfer training,  functional ambulation, and dynamic standing balance. Problem solved donning/doffing shoes+ R AFO using shoe horn, shoe buttons, and figure four position. Pt propelled wc to therapy apartment, ambulated into bathroom W/ RW  And min guard for turning to sit onto tub bench. Completed tub bench transfer with min A to lift R LE in and out of tub. Functional ambulation focused on transitioning from carpeted to hard surfaces with RW and close supervision. Dynamic standing activity using biodex. Focus on weight shifting with ankle and hip balance strategies. Pt returned to room at end of session and left with needs met.  Session 2 Therapeutic activities focused on functional ambulation, dynamic standing balance, and activity tolerance. Pt completed obstacle course w/ RW and close supervision with turning to avoid orange cones. Went through course 2x with seated rest break in between. Dynamic standing and weight shifting activity using horse shoe toss. Pt then utilized reacher to retrieve horse shoes with overall close supervision with intermittent min guard A for balance. Pt returned to room at end of session and left seated in wc with needs met.  Therapy Documentation Precautions:  Precautions Precautions: Back Precaution Booklet Issued: No Precaution Comments: reviewed back precautions with pt  Required Braces or Orthoses: Spinal Brace Spinal Brace:  Thoracolumbosacral orthotic, Applied in sitting position Restrictions Weight Bearing Restrictions: No Pain: Pain Assessment Pain Assessment: 0-10 Pain Score: 2  Pain Type: Acute pain Pain Location: Back Pain Orientation: Mid Pain Descriptors / Indicators: Aching Pain Onset: On-going Pain Intervention(s): Repositioned  See Function Navigator for Current Functional Status.   Therapy/Group: Individual Therapy  [name] 12/31/2016, 3:47 PM

## 2016-12-31 NOTE — Progress Notes (Signed)
Physical Therapy Session Note  Patient Details  Name: Megan [name] MRN: [id number] Date of Birth: 1950-09-22  Today's Date: 12/31/2016 PT Individual Time: 1335-1400 PT Individual Time Calculation (min): 25 min   Short Term Goals: Week 2:  PT Short Term Goal 1 (Week 2): =LTGs due to ELOS  Skilled Therapeutic Interventions/Progress Updates: pt presented in w/c agreeable to therapy. Performed gait training/ambulation for endurance. Pt ambulated to fatigue/decreased gait quality. Pt ambulated 31f, 574fx 2 respectively with seated rest. Orthotist present during part of session and able to answer queries presented by pt. Pt propelled back to room after ambulation and remained in w/c with call bell within reach and needs met.      Therapy Documentation Precautions:  Precautions Precautions: Back Precaution Booklet Issued: No Precaution Comments: reviewed back precautions with pt  Required Braces or Orthoses: Spinal Brace Spinal Brace: Thoracolumbosacral orthotic, Applied in sitting position Restrictions Weight Bearing Restrictions: No General:   Vital Signs: Therapy Vitals Temp: 98 F (36.7 C) Temp Source: Oral Pulse Rate: 84 Resp: 16 BP: (!) 138/56 Patient Position (if appropriate): Sitting Oxygen Therapy SpO2: 97 % O2 Device: Not Delivered Pain: Pain Assessment Pain Assessment: 0-10 Pain Score: 2  Pain Type: Acute pain Pain Location: Back Pain Orientation: Mid Pain Descriptors / Indicators: Aching Pain Onset: On-going Pain Intervention(s): Repositioned   See Function Navigator for Current Functional Status.   Therapy/Group: Individual Therapy  Davide Risdon  Cara Aguino, PTA  12/31/2016, 3:45 PM

## 2017-01-01 ENCOUNTER — Inpatient Hospital Stay (HOSPITAL_COMMUNITY): Payer: BLUE CROSS/BLUE SHIELD | Admitting: Occupational Therapy

## 2017-01-01 DIAGNOSIS — E119 Type 2 diabetes mellitus without complications: Secondary | ICD-10-CM

## 2017-01-01 LAB — GLUCOSE, CAPILLARY
GLUCOSE-CAPILLARY: 97 mg/dL (ref 65–99)
Glucose-Capillary: 130 mg/dL — ABNORMAL HIGH (ref 65–99)
Glucose-Capillary: 246 mg/dL — ABNORMAL HIGH (ref 65–99)
Glucose-Capillary: 137 mg/dL — ABNORMAL HIGH (ref 65–99)

## 2017-01-01 NOTE — Progress Notes (Signed)
Megan [name] is a 66 y.o. female Dec 08, 1950 [id number]  Subjective: No new complaints. No new problems. Slept well. Feeling well and thankful for improvement in strength since surgery.  Objective: Vital signs in last 24 hours: Temp:  [97.7 F (36.5 C)-98 F (36.7 C)] 97.7 F (36.5 C) (04/21 0500) Pulse Rate:  [72-84] 72 (04/21 0500) Resp:  [16-18] 18 (04/21 0500) BP: (138-155)/(56-64) 155/64 (04/21 0500) SpO2:  [97 %-99 %] 99 % (04/21 0500) Weight change:  Last BM Date: 12/31/16  Intake/Output from previous day: 04/20 0701 - 04/21 0700 In: 720 [P.O.:720] Out: -   Physical Exam General: No apparent distress   Bright and optimistic Lungs: Normal effort. Lungs clear to auscultation, no crackles or wheezes. Cardiovascular: Regular rate and rhythm, no edema Neurological: No new neurological deficits - R LE weakness improving   Lab Results: BMET    Component Value Date/Time   NA 135 12/22/2016 0436   K 4.0 12/22/2016 0436   CL 100 (L) 12/22/2016 0436   CO2 26 12/22/2016 0436   GLUCOSE 156 (H) 12/22/2016 0436   BUN 14 12/22/2016 0436   CREATININE 0.77 12/22/2016 0436   CALCIUM 8.6 (L) 12/22/2016 0436   GFRNONAA >60 12/22/2016 0436   GFRAA >60 12/22/2016 0436   CBC    Component Value Date/Time   WBC 12.2 (H) 12/22/2016 0436   RBC 4.01 12/22/2016 0436   HGB 12.0 12/22/2016 0436   HCT 35.6 (L) 12/22/2016 0436   PLT 188 12/22/2016 0436   MCV 88.8 12/22/2016 0436   MCH 29.9 12/22/2016 0436   MCHC 33.7 12/22/2016 0436   RDW 13.7 12/22/2016 0436   LYMPHSABS 1.4 12/22/2016 0436   MONOABS 0.7 12/22/2016 0436   EOSABS 0.0 12/22/2016 0436   BASOSABS 0.0 12/22/2016 0436   CBG's (last 3):    Recent Labs  12/31/16 2050 01/01/17 0624 01/01/17 1208  GLUCAP 186* 97 130*   LFT's Lab Results  Component Value Date   ALT 22 12/22/2016   AST 17 12/22/2016   ALKPHOS 66 12/22/2016   BILITOT 0.6 12/22/2016    Studies/Results: No results found.  Medications:  I  have reviewed the patient's current medications. Scheduled Medications: . atorvastatin  20 mg Oral QPM  . bisacodyl  10 mg Rectal Daily  . celecoxib  200 mg Oral Q12H  . cholecalciferol  2,000 Units Oral Daily  . ciprofloxacin  250 mg Oral BID  . dexamethasone  4 mg Oral Daily  . gabapentin  300 mg Oral TID  . insulin aspart  0-15 Units Subcutaneous TID AC & HS  . lisinopril  10 mg Oral Daily  . metFORMIN  500 mg Oral Q breakfast  . methocarbamol  750 mg Oral QID  . multivitamin with minerals  1 tablet Oral Daily  . oxyCODONE  20 mg Oral Q12H  . pantoprazole  40 mg Oral Daily  . scopolamine  1 patch Transdermal Q72H  . senna-docusate  2 tablet Oral BID   PRN Medications: acetaminophen **OR** acetaminophen, bisacodyl, HYDROcodone-acetaminophen, ondansetron **OR** ondansetron (ZOFRAN) IV, polyethylene glycol, sorbitol  Assessment/Plan: Active Problems:   Type 2 diabetes mellitus without complication, without long-term current use of insulin (HCC)   Essential hypertension   Paraparesis (HCC)   Myelopathy (HCC)   Neurogenic bladder   Neurogenic bowel  1. Thoracic myelopathy w/ R>L Le paraparesis s/p surgical decomression - pain controlled, strength improving - continue back brace when OOB, current rx for pain and continue CIR therapies for debility related  to same 2. DM2 - SSI due to Decadron exacerbation of cbg control, continue metformin as ongoing PTA and monitor 3. HTN - well controlled - continue same 4. Hyperlipidemia - continue statin 5. Neurogenic bladder d/t #1 - improved - ongoing Cipro for associated Kleb UTI   Length of stay, days: 11   Valerie A. Asa Lente, MD 01/01/2017, 12:18 PM

## 2017-01-01 NOTE — Progress Notes (Signed)
Occupational Therapy Session Note  Patient Details  Name: Megan [name] MRN: [id number] Date of Birth: [birth year]  Today's Date: 01/01/2017 OT Individual Time: 1101-1210 OT Individual Time Calculation (min): 69 min   Short Term Goals: Week 2:  OT Short Term Goal 2 (Week 2): LTG=STG 2/2 estimated LOS  Skilled Therapeutic Interventions/Progress Updates:    OT treatment session focused on LB dressing techniques, functional ambulation, and LB strengthening. Pt brought L:E's into firgure four position, and donned TED hose using modified technique w/ set-up A. Problem solved strategies for donning shoes and AFO. Pt still required min A to don R AFO 2/2 design of shoe. Pt plans to acquire tennis shoes for ease of donning. Pt ambulated 150 ft with close supervision and intermittent min guard A. Pt then completed 10 mins on NuStep on level 3 with only BLE's. Pt propelled wc back to room at end of session and was left with needs met and nurse tech present.    Therapy Documentation Precautions:  Precautions Precautions: Back Precaution Booklet Issued: No Precaution Comments: reviewed back precautions with pt  Required Braces or Orthoses: Spinal Brace Spinal Brace: Thoracolumbosacral orthotic, Applied in sitting position Restrictions Weight Bearing Restrictions: No Pain: Pain Assessment Pain Assessment: 0-10 Pain Score: 1  Pain Type: Acute pain Pain Location: Back Pain Orientation: Mid Pain Descriptors / Indicators: Aching Pain Onset: On-going Pain Intervention(s): Repositioned  See Function Navigator for Current Functional Status.   Therapy/Group: Individual Therapy  [name] 01/01/2017, 11:53 AM

## 2017-01-02 ENCOUNTER — Inpatient Hospital Stay (HOSPITAL_COMMUNITY): Payer: BLUE CROSS/BLUE SHIELD

## 2017-01-02 DIAGNOSIS — I10 Essential (primary) hypertension: Secondary | ICD-10-CM

## 2017-01-02 LAB — GLUCOSE, CAPILLARY
GLUCOSE-CAPILLARY: 187 mg/dL — AB (ref 65–99)
GLUCOSE-CAPILLARY: 139 mg/dL — AB (ref 65–99)
Glucose-Capillary: 80 mg/dL (ref 65–99)
Glucose-Capillary: 130 mg/dL — ABNORMAL HIGH (ref 65–99)

## 2017-01-02 NOTE — Progress Notes (Signed)
Patient A/O.Patient refused MVI and VIT D

## 2017-01-02 NOTE — Progress Notes (Signed)
FRANCENE MCERLEAN is a 66 y.o. female 1951-05-09 [id number]  Subjective: Feels well - no problems or questions - pain controlled, strength improved Objective: Vital signs in last 24 hours: Temp:  [97.8 F (36.6 C)-98.4 F (36.9 C)] 97.8 F (36.6 C) (04/22 0500) Pulse Rate:  [73-77] 77 (04/22 0500) Resp:  [18] 18 (04/22 0500) BP: (123-143)/(51-72) 123/51 (04/22 0820) SpO2:  [100 %] 100 % (04/22 0500) Weight change:  Last BM Date: 01/01/17  Intake/Output from previous day: 04/21 0701 - 04/22 0700 In: 480 [P.O.:480] Out: -   Physical Exam General: No apparent distress   Bright and optimistic Lungs: Normal effort. Lungs clear to auscultation, no crackles or wheezes. Cardiovascular: Regular rate and rhythm, no edema Neurological: No new neurological deficits - R LE weakness improving   Lab Results: BMET    Component Value Date/Time   NA 135 12/22/2016 0436   K 4.0 12/22/2016 0436   CL 100 (L) 12/22/2016 0436   CO2 26 12/22/2016 0436   GLUCOSE 156 (H) 12/22/2016 0436   BUN 14 12/22/2016 0436   CREATININE 0.77 12/22/2016 0436   CALCIUM 8.6 (L) 12/22/2016 0436   GFRNONAA >60 12/22/2016 0436   GFRAA >60 12/22/2016 0436   CBC    Component Value Date/Time   WBC 12.2 (H) 12/22/2016 0436   RBC 4.01 12/22/2016 0436   HGB 12.0 12/22/2016 0436   HCT 35.6 (L) 12/22/2016 0436   PLT 188 12/22/2016 0436   MCV 88.8 12/22/2016 0436   MCH 29.9 12/22/2016 0436   MCHC 33.7 12/22/2016 0436   RDW 13.7 12/22/2016 0436   LYMPHSABS 1.4 12/22/2016 0436   MONOABS 0.7 12/22/2016 0436   EOSABS 0.0 12/22/2016 0436   BASOSABS 0.0 12/22/2016 0436   CBG's (last 3):    Recent Labs  01/01/17 1608 01/01/17 2131 01/02/17 0622  GLUCAP 246* 137* 80   LFT's Lab Results  Component Value Date   ALT 22 12/22/2016   AST 17 12/22/2016   ALKPHOS 66 12/22/2016   BILITOT 0.6 12/22/2016    Studies/Results: No results found.  Medications:  I have reviewed the patient's current  medications. Scheduled Medications: . atorvastatin  20 mg Oral QPM  . bisacodyl  10 mg Rectal Daily  . celecoxib  200 mg Oral Q12H  . cholecalciferol  2,000 Units Oral Daily  . ciprofloxacin  250 mg Oral BID  . dexamethasone  4 mg Oral Daily  . gabapentin  300 mg Oral TID  . insulin aspart  0-15 Units Subcutaneous TID AC & HS  . lisinopril  10 mg Oral Daily  . metFORMIN  500 mg Oral Q breakfast  . methocarbamol  750 mg Oral QID  . multivitamin with minerals  1 tablet Oral Daily  . oxyCODONE  20 mg Oral Q12H  . pantoprazole  40 mg Oral Daily  . scopolamine  1 patch Transdermal Q72H  . senna-docusate  2 tablet Oral BID   PRN Medications: acetaminophen **OR** acetaminophen, bisacodyl, HYDROcodone-acetaminophen, ondansetron **OR** ondansetron (ZOFRAN) IV, polyethylene glycol, sorbitol  Assessment/Plan: Principal Problem:   Myelopathy (HCC) Active Problems:   Type 2 diabetes mellitus without complication, without long-term current use of insulin (HCC)   Essential hypertension   Paraparesis (HCC)   Neurogenic bladder   Neurogenic bowel  1. Thoracic myelopathy w/ R>L LE paraparesis s/p surgical decomression - pain controlled, strength improving - continue back brace when OOB, current rx for pain and continue CIR therapies for debility related to same 2. DM2 - SSI  due to Decadron exacerbation of cbg control, continue metformin as ongoing PTA and monitor 3. HTN - well controlled - continue same 4. Hyperlipidemia - continue statin 5. Neurogenic bladder d/t #1 - improved - ongoing Cipro for associated Kleb UTI   Length of stay, days: 12   Valerie A. Asa Lente, MD 01/02/2017, 10:40 AM

## 2017-01-02 NOTE — Progress Notes (Signed)
Occupational Therapy Session Note  Patient Details  Name: Megan [name] MRN: [id number] Date of Birth: 10-11-1950  Today's Date: 01/02/2017 OT Individual Time: 504 635 1700 OT Individual Time Calculation (min): 57 min    Short Term Goals: Week 1:  OT Short Term Goal 1 (Week 1): Pt will perform toilet transfer with mod A in order to decrease level of assistance. OT Short Term Goal 1 - Progress (Week 1): Met OT Short Term Goal 2 (Week 1): Pt will maintain standing balance for 2 minutes during LB clothing management and hygiene with min A. OT Short Term Goal 2 - Progress (Week 1): Met OT Short Term Goal 3 (Week 1): Pt will perform LB dressing with mod A in order to decrease level of assistance. OT Short Term Goal 3 - Progress (Week 1): Met OT Short Term Goal 4 (Week 1): Pt will engage in 10 minutes of functional task with 2 rest breaks or less.  OT Short Term Goal 4 - Progress (Week 1): Met  Skilled Therapeutic Interventions/Progress Updates:    1:1. Focus on ADL retraining. Pt completes all functional mobility with supervision- MIN A for balance with back brace donned and VC for safety awareness. Pt bathes seated in TTB in walk in shower with set up using long handled shower sponge to wash B feet and back. Back incision covered in plastic. Pt dons bra and shirt with set up to obtain clothing and LB clothing with supervision and VC for back precautions. Pt able to assume seated figure 4 and use reacher to thread BLE into pants. Pt sit to stand at sink level with 1 posterior LOB noted, but pt able to stabilize herself on sink. Pt dons B ted hose and L shoe, Pt requires touching A to assist to guide R foot into shoe. Exited session with pt seated in w/c with call light in reach and all needs met.   Therapy Documentation Precautions:  Precautions Precautions: Back Precaution Booklet Issued: No Precaution Comments: reviewed back precautions with pt  Required Braces or Orthoses: Spinal  Brace Spinal Brace: Thoracolumbosacral orthotic, Applied in sitting position Restrictions Weight Bearing Restrictions: No General:    See Function Navigator for Current Functional Status.   Therapy/Group: Individual Therapy  Tonny Branch 01/02/2017, 10:00 AM

## 2017-01-03 ENCOUNTER — Inpatient Hospital Stay (HOSPITAL_COMMUNITY): Payer: BLUE CROSS/BLUE SHIELD | Admitting: Occupational Therapy

## 2017-01-03 ENCOUNTER — Inpatient Hospital Stay (HOSPITAL_COMMUNITY): Payer: BLUE CROSS/BLUE SHIELD

## 2017-01-03 ENCOUNTER — Inpatient Hospital Stay (HOSPITAL_COMMUNITY): Payer: BLUE CROSS/BLUE SHIELD | Admitting: Physical Therapy

## 2017-01-03 LAB — GLUCOSE, CAPILLARY
GLUCOSE-CAPILLARY: 101 mg/dL — AB (ref 65–99)
GLUCOSE-CAPILLARY: 174 mg/dL — AB (ref 65–99)
GLUCOSE-CAPILLARY: 203 mg/dL — AB (ref 65–99)
Glucose-Capillary: 141 mg/dL — ABNORMAL HIGH (ref 65–99)

## 2017-01-03 NOTE — Discharge Summary (Signed)
NAME:  Megan [name], Megan [name]                   ACCOUNT NO.:  MEDICAL RECORD NO.:  [id number]  LOCATION:                                 FACILITY:  PHYSICIAN:  Charlett Blake, M.D.DATE OF BIRTH:  1950/09/17  DATE OF ADMISSION:  12/21/2016 DATE OF DISCHARGE:  01/04/2017                              DISCHARGE SUMMARY   DISCHARGE DIAGNOSES: 1. 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 2. Sequential compression devices for deep venous thrombosis     prophylaxis. 3. Pain management. 4. Diabetes mellitus. 5. Hypertension. 6. Hyperlipidemia. 7. Neurogenic bladder. 8. Constipation. 9. Gastroesophageal reflux disease.  HISTORY OF PRESENT ILLNESS:  This is a 66 year old right-handed female history of diabetes mellitus, hyperlipidemia, lumbar fusion 2016 and 17 as well as cervical fusion, 2001.  She lives with spouse, had been using a scooter for household and community ambulation secondary to bilateral lower extremity weakness for the past few weeks.  Husband works during the day as well as a local daughter.  Presented December 18, 2016, with long- standing history of neck and back pain, progressive paraparesis.  Denied any bowel or bladder dysfunction.  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  Underwent left T10 costotransversectomy, T10-T11 laminectomy for decompression, pedicle screws, T9-T12, December 19, 2016, per Dr. Cyndy Freeze.  HOSPITAL COURSE:  Pain management.  Decadron protocol as indicated. TLSO back brace when out of bed.  Acute blood loss anemia 10.4 and monitored.  Physical and occupational therapy.  The patient was admitted for comprehensive rehab program.  PAST MEDICAL HISTORY:  See discharge diagnoses.  SOCIAL HISTORY:  Lives with husband.  Had been using a scooter  recently.  FUNCTIONAL STATUS:  Upon admission to rehab services +2 physical assist, ambulate 2 feet rolling walker; +2 physical assist, sit to stand; max total assist, ADLs.  PHYSICAL EXAMINATION:  VITAL SIGNS:  Blood pressure 154/92, pulse 76, temperature 98, and respirations 18. GENERAL:  This was an alert female, in no acute distress.  Oriented x3. EYES:  EOMs intact. NECK:  Supple.  Nontender.  No JVD. CARDIAC:  Regular without murmur. ABDOMEN:  Soft and nontender.  Good bowel sounds. LUNGS:  Clear to auscultation without wheeze. EXTREMITIES:  Bilateral upper extremities 5/5.  Left lower extremity 4/5 proximal and distal.  Right lower extremity 2/5 proximal to distal.  REHABILITATION HOSPITAL COURSE:  The patient was admitted to inpatient rehab services with therapies initiated on a 3-hour daily basis, consisting of physical therapy, occupational therapy, and rehabilitation nursing.  The following issues were addressed during the patient's rehabilitation stay.  Pertaining to Ms. Kasler's progressive paraparesis secondary to thoracic myelopathy, she had undergone laminectomy, decompression, bilateral pedicle screws, December 19, 2016, per Neurosurgery.  Back brace when out of bed.  Surgical site healing nicely.  Decadron taper.  SCDs for DVT prophylaxis, venous Doppler studies negative.  Pain management with the use of Celebrex 200 mg every 12 hours, Neurontin 300 mg t.i.d., Robaxin 750 mg p.o. 4 times daily, OxyContin sustained release 20 mg every  12 hours that was discontinued at patient request prior to discharge, and hydrocodone for breakthrough pain.  Blood sugars overall controlled.  Monitor while on Decadron.  She continued with Glucophage 500 mg daily.  Neurogenic bladder success with Urecholine, later discontinued.  She was completing a 7-day course for Klebsiella UTI, remaining afebrile.  Constipation, neurogenic bowel.  Bowel program education ongoing completed.   Blood pressures controlled with lisinopril.  The patient received weekly collaborative interdisciplinary team conferences to discuss estimated length of stay, family teaching, any barriers to her discharge. Performed ambulation for endurance energy conservation techniques, ambulating 96 feet, back brace when out of bed.  Could propel her wheelchair as needed to her room, activities of daily living and homemaking.  She can increase her ambulation up to 150 feet with occupational therapy, intermittent minimal guard.  Again, propel her wheelchair back to her room at the end of sessions.  Right AFO brace had been fitted.  Overall, the patient encouraged with her overall progress, with ultimate plan discharge to home.  DISCHARGE MEDICATIONS: 1. Lipitor 20 mg p.o. daily. 2. Dulcolax suppository daily. 3. Celebrex 200 mg every 12 hours. 4. Vitamin D 2000 units daily. 5. Cipro 250 mg p.o. b.i.d., complete a 7-day course. 6. Decadron taper as directed. 7. Neurontin 300 mg p.o. t.i.d. 8. Lisinopril 10 mg p.o. daily. 9. Glucophage 500 mg p.o. daily. 10.Robaxin 750 mg p.o. q.i.d. 11.Multivitamin daily. 12.Protonix 40 mg p.o. daily. 13.Senokot-S tablet 2 p.o. b.i.d. 14.Hydrocodone 1-2 tablets every 4 hours as needed for pain.  DIET:  Diabetic diet.  FOLLOWUP:  She would follow up with Dr. Alysia Penna at the outpatient rehab center as directed; Dr. Marland Kitchen Ditty, call for appointment 2 weeks.  Dr. Alma Friendly, [address] [place], [place].  Medical management.  SPECIAL INSTRUCTIONS:  Back brace when out of bed.     Lauraine Rinne, P.A.   ______________________________ Charlett Blake, M.D.    DA/MEDQ  D:  01/03/2017  T:  01/03/2017  Job:  [id number]  cc:   Charlett Blake, M.D. Alma Friendly, MD [place]

## 2017-01-03 NOTE — Discharge Summary (Signed)
Discharge summary job [id number]

## 2017-01-03 NOTE — Progress Notes (Signed)
Physical Therapy Discharge Summary  Patient Details  Name: Megan [name] MRN: [id number] Date of Birth: 12-Dec-1950  Today's Date: 01/03/2017 PT Individual Time: 0800-0859 PT Individual Time Calculation (min): 59 min    Patient has met 11 of 11 long term goals due to improved activity tolerance, improved balance, improved postural control, increased strength, increased range of motion, decreased pain, ability to compensate for deficits, functional use of  right lower extremity and left lower extremity and improved coordination.  Patient to discharge at an ambulatory level Supervision.   Patient's care partner is independent to provide the necessary physical assistance at discharge.   Recommendation:  Patient will benefit from ongoing skilled PT services in home health setting to continue to advance safe functional mobility, address ongoing impairments in strength, ROM, gait stability/endurance, general functional mobility, and minimize fall risk.  Equipment: No equipment provided  Reasons for discharge: treatment goals met  Patient/family agrees with progress made and goals achieved: Yes  Session: Pt sitting in w/c upon arrival, able to don Rt AFO independently. Working on mobility throughout session including w/c mobility (confirms she will use her scooter at home) as well as ambulation. Pt able to ambulate 160 ft with rw and supervision. Also up/down 8 3" steps with rail. Orthotist arriving during session and new AFO applied. Pt reports feeling confident with mobility and denies any questions or concerns regarding going home tomorrow. Pt up in w/c after session with all needs in reach.   PT Discharge Precautions/Restrictions Precautions Precautions: Back Required Braces or Orthoses: Spinal Brace Spinal Brace: Thoracolumbosacral orthotic;Applied in sitting position Restrictions Weight Bearing Restrictions: No Pain 1/10 - back pain. Nursing providing pain meds. Vision/Perception   Vision - History Baseline Vision: No visual deficits Patient Visual Report: No change from baseline Vision - Assessment Eye Alignment: Within Functional Limits Perception Perception: Within Functional Limits Praxis Praxis: Intact  Cognition Overall Cognitive Status: Within Functional Limits for tasks assessed Arousal/Alertness: Awake/alert Orientation Level: Oriented X4 Sensation Sensation Light Touch: Impaired by gross assessment Additional Comments: Diminished sensation through bilateral LEs. Pt reports having increased tinghling in Rt LE.  Coordination Gross Motor Movements are Fluid and Coordinated: No Fine Motor Movements are Fluid and Coordinated: No Coordination and Movement Description: impaired coordination on movement with LEs Motor  Motor Motor: Abnormal tone Motor - Discharge Observations: Mildly increased tone in Rt LE.      Balance Balance Balance Assessed: Yes Static Sitting Balance Static Sitting - Balance Support: No upper extremity supported Static Sitting - Level of Assistance: 7: Independent Dynamic Sitting Balance Dynamic Sitting - Balance Support: No upper extremity supported Dynamic Sitting - Level of Assistance: 7: Independent Sitting balance - Comments: Pt able to reach forward and overhead without support and no LOB while sitting on EOB.  Static Standing Balance Static Standing - Balance Support: Bilateral upper extremity supported Static Standing - Level of Assistance: 6: Modified independent (Device/Increase time) Dynamic Standing Balance Dynamic Standing - Balance Support: During functional activity;Bilateral upper extremity supported Dynamic Standing - Level of Assistance: 5: Stand by assistance Extremity Assessment  RUE Assessment RUE Assessment: Within Functional Limits LUE Assessment LUE Assessment: Exceptions to [facility] RLE PROM (degrees) Right Ankle Dorsiflexion: 0 (neutral position with overpressure) RLE Strength Right Hip Flexion:  2+/5 Right Knee Extension: 4-/5 Right Ankle Dorsiflexion: 3-/5 RLE Tone RLE Tone: Mild LLE Strength Left Hip Flexion: 3+/5 Left Knee Extension: 4/5 Left Ankle Dorsiflexion: 3+/5   See Function Navigator for Current Functional Status.  Linard Millers, PT 01/03/2017, 12:50  PM

## 2017-01-03 NOTE — Progress Notes (Signed)
Subjective/Complaints:  Pt states she is not using hydrocodone No ICP needed   ROS: pt denies nausea, vomiting,cough, shortness of breath or chest pain   Objective: Vital Signs: Blood pressure 133/63, pulse 71, temperature 98 F (36.7 C), temperature source Oral, resp. rate 18, weight 78.4 kg (172 lb 13.5 oz), SpO2 98 %. No results found. Results for orders placed or performed during the hospital encounter of 12/21/16 (from the past 72 hour(s))  Glucose, capillary     Status: Abnormal   Collection Time: 12/31/16 11:10 AM  Result Value Ref Range   Glucose-Capillary 153 (H) 65 - 99 mg/dL  Glucose, capillary     Status: Abnormal   Collection Time: 12/31/16  4:45 PM  Result Value Ref Range   Glucose-Capillary 148 (H) 65 - 99 mg/dL   Comment 1 Notify RN   Glucose, capillary     Status: Abnormal   Collection Time: 12/31/16  8:50 PM  Result Value Ref Range   Glucose-Capillary 186 (H) 65 - 99 mg/dL   Comment 1 Notify RN   Glucose, capillary     Status: None   Collection Time: 01/01/17  6:24 AM  Result Value Ref Range   Glucose-Capillary 97 65 - 99 mg/dL   Comment 1 Notify RN   Glucose, capillary     Status: Abnormal   Collection Time: 01/01/17 12:08 PM  Result Value Ref Range   Glucose-Capillary 130 (H) 65 - 99 mg/dL   Comment 1 Notify RN   Glucose, capillary     Status: Abnormal   Collection Time: 01/01/17  4:08 PM  Result Value Ref Range   Glucose-Capillary 246 (H) 65 - 99 mg/dL   Comment 1 Notify RN   Glucose, capillary     Status: Abnormal   Collection Time: 01/01/17  9:31 PM  Result Value Ref Range   Glucose-Capillary 137 (H) 65 - 99 mg/dL   Comment 1 Notify RN   Glucose, capillary     Status: None   Collection Time: 01/02/17  6:22 AM  Result Value Ref Range   Glucose-Capillary 80 65 - 99 mg/dL   Comment 1 Notify RN   Glucose, capillary     Status: Abnormal   Collection Time: 01/02/17 11:56 AM  Result Value Ref Range   Glucose-Capillary 130 (H) 65 - 99 mg/dL   Comment 1 Notify RN   Glucose, capillary     Status: Abnormal   Collection Time: 01/02/17  4:16 PM  Result Value Ref Range   Glucose-Capillary 187 (H) 65 - 99 mg/dL   Comment 1 Notify RN   Glucose, capillary     Status: Abnormal   Collection Time: 01/02/17  8:40 PM  Result Value Ref Range   Glucose-Capillary 139 (H) 65 - 99 mg/dL   Comment 1 Notify RN   Glucose, capillary     Status: Abnormal   Collection Time: 01/03/17  6:16 AM  Result Value Ref Range   Glucose-Capillary 101 (H) 65 - 99 mg/dL   Comment 1 Notify RN      HEENT: normal Cardio: RRR Resp:CTA B with normal effort GI: BS positive and NT, ND Extremity:  No Edema Skin:   Honeycomb dressing removed Incision with some dried blood no drainage, erythema or fluctuance Neuro: Alert/Oriented, Abnormal Sensory Normal sensation in BUE, absent right L5 sensation to LT as well as right L3 and Abnormal Motor 5/5 in BUE, 3- Right knee ext ,0/5 Right ankle DF, LLE 3/5 KE 3- ADF, 3 L Hip  ext--exam unchanged Musc/Skel:  Other no pain with UE or LE ROM, full AROM in BUE Gen NAD   Assessment/Plan: 1. 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 Physiatrist is providing close team supervision and 24 hour management of active medical problems listed below. Physiatrist and rehab team continue to assess barriers to discharge/monitor patient progress toward functional and medical goals. FIM: Function - Bathing Position: Shower Body parts bathed by patient: Right arm, Left arm, Chest, Abdomen, Front perineal area, Buttocks, Right upper leg, Left upper leg, Right lower leg, Left lower leg, Back Body parts bathed by helper: Back Bathing not applicable: Left lower leg, Right lower leg Assist Level: Supervision or verbal cues  Function- Upper Body Dressing/Undressing What is the patient wearing?: Pull over shirt/dress, Orthosis, Bra Bra -  Perfomed by patient: Thread/unthread right bra strap, Thread/unthread left bra strap, Hook/unhook bra (pull down sports bra) Bra - Perfomed by helper: Hook/unhook bra (pull down sports bra) Pull over shirt/dress - Perfomed by patient: Thread/unthread right sleeve, Put head through opening, Thread/unthread left sleeve, Pull shirt over trunk Pull over shirt/dress - Perfomed by helper: Thread/unthread right sleeve, Thread/unthread left sleeve, Put head through opening, Pull shirt over trunk Orthosis activity level: Performed by patient Assist Level: Set up, More than reasonable time Set up : To obtain clothing/put away Function - Lower Body Dressing/Undressing What is the patient wearing?: Underwear, Pants, AFO, Maryln Manuel, Shoes Position: Education officer, museum at [employer] - Performed by patient: Thread/unthread right underwear leg, Thread/unthread left underwear leg, Pull underwear up/down Underwear - Performed by helper: Thread/unthread right underwear leg, Thread/unthread left underwear leg, Pull underwear up/down Pants- Performed by patient: Thread/unthread right pants leg, Thread/unthread left pants leg, Pull pants up/down Pants- Performed by helper: Thread/unthread right pants leg, Thread/unthread left pants leg, Pull pants up/down Non-skid slipper socks- Performed by patient: Don/doff right sock, Don/doff left sock Non-skid slipper socks- Performed by helper: Don/doff right sock, Don/doff left sock Socks - Performed by patient: Don/doff right sock, Don/doff left sock Socks - Performed by helper: Don/doff right sock, Don/doff left sock Shoes - Performed by patient: Don/doff right shoe, Don/doff left shoe, Fasten right, Fasten left Shoes - Performed by helper: Don/doff right shoe AFO - Performed by patient: Don/doff right AFO TED Hose - Performed by patient: Don/doff right TED hose, Don/doff left TED hose TED Hose - Performed by helper: Don/doff left TED hose, Don/doff right TED hose Assist  for footwear: Setup Assist for lower body dressing: Assistive device, More than reasonable time Assistive Device Comment: reacher, shoe horn, shoe button  Function - Toileting Toileting steps completed by patient: Adjust clothing prior to toileting, Performs perineal hygiene, Adjust clothing after toileting Toileting steps completed by helper: Adjust clothing prior to toileting, Adjust clothing after toileting Toileting Assistive Devices: Grab bar or rail Assist level: Supervision or verbal cues  Function Midwife transfer assistive device: Elevated toilet seat/BSC over toilet Mechanical lift: Stedy Assist level to toilet: Supervision or verbal cues Assist level from toilet: Supervision or verbal cues  Function - Chair/bed transfer Chair/bed transfer method: Stand pivot Chair/bed transfer assist level: Supervision or verbal cues Chair/bed transfer assistive device: Environmental consultant, Marine scientist lift: Stedy Chair/bed transfer details: Verbal cues for precautions/safety, Tactile cues for posture, Verbal cues for technique  Function - Locomotion: Wheelchair Will patient use wheelchair at discharge?: Yes Type: Manual Max wheelchair distance: 120 ft Assist Level: No help, No cues, assistive device,  takes more than reasonable amount of time Assist Level: No help, No cues, assistive device, takes more than reasonable amount of time Assist Level: No help, No cues, assistive device, takes more than reasonable amount of time Turns around,maneuvers to table,bed, and toilet,negotiates 3% grade,maneuvers on rugs and over doorsills: Yes Function - Locomotion: Ambulation Ambulation activity did not occur: Safety/medical concerns Assistive device: Walker-rolling Max distance: 150 Assist level: Touching or steadying assistance (Pt > 75%) Walk 10 feet activity did not occur: Safety/medical concerns Assist level: Touching or steadying assistance (Pt > 75%) Walk 50 feet with 2 turns  activity did not occur: Safety/medical concerns Assist level: Touching or steadying assistance (Pt > 75%) Walk 150 feet activity did not occur: Safety/medical concerns Walk 10 feet on uneven surfaces activity did not occur: Safety/medical concerns  Function - Comprehension Comprehension: Auditory Comprehension assist level: Follows complex conversation/direction with extra time/assistive device  Function - Expression Expression: Verbal Expression assist level: Expresses complex ideas: With no assist  Function - Social Interaction Social Interaction assist level: Interacts appropriately with others - No medications needed.  Function - Problem Solving Problem solving assist level: Solves complex problems: Recognizes & self-corrects  Function - Memory Memory assist level: Recognizes or recalls 90% of the time/requires cueing < 10% of the time Patient normally able to recall (first 3 days only): Current season, Location of own room, Staff names and faces, That he or she is in a hospital  Medical Problem List and Plan: 1. 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 Back brace when out of bed Continue CIR PT, OT,2. DVT Prophylaxis/Anticoagulation: SCDs. Dopplers negative 3. Pain Management:OxyContin sustained release 20 mg every 12 hours, will d/c after am dose Celebrex 200 mg every every 12 hours, Neurontin 300 mg 3 times a day, Robaxin 750 mg 4 times a day, Hydrocodone as needed 4. Mood: Provide emotional support 5. Neuropsych: This patient iscapable of making decisions on herown behalf. 6. Skin/Wound Care: Routine skin checks 7. Fluids/Electrolytes/Nutrition: encourage PO 8.Diabetes mellitus. Glucophage 500 mg twice a day. Check blood sugars before meals and at bedtime.   -cover with SSI while tapering steroids, sugars higher in PM  -decadron decr to '6mg'$   daily as of 4/16, CBG going down, change metformin to qam, ask pt to have hs snack, cont to wean decadron to '4mg'$  CBG (last 3)   Recent Labs  01/02/17 1616 01/02/17 2040 01/03/17 0616  GLUCAP 187* 139* 101*    9.Hypertension. Lisinopril 10 mg daily. Vitals:   01/02/17 1357 01/03/17 0500  BP: (!) 111/56 133/63  Pulse: 63 71  Resp: 18 18  Temp: 98.4 F (36.9 C) 98 F (36.7 C)   10.Hyperlipidemia. Lipitor 11.Neurogenic bladder.   - -ua  ucx >100K Kleb Sens to Cipro 7 d course   12.Constipation. Neurogenic bowel,  -senokot-s increased to 2 tablets twice a day  -last BM 4/16  -Dulcolax suppository daily 13.GERD. Protonix  LOS (Days) 13 A FACE TO FACE EVALUATION WAS PERFORMED  KIRSTEINS,ANDREW E 01/03/2017, 7:29 AM

## 2017-01-03 NOTE — Progress Notes (Signed)
Physical Therapy Session Note  Patient Details  Name: Megan [name] MRN: [id number] Date of Birth: 1951/01/16  Today's Date: 01/03/2017 PT Individual Time: 1000-1045 PT Individual Time Calculation (min): 45 min   Short Term Goals: Week 2:  PT Short Term Goal 1 (Week 2): =LTGs due to ELOS  Skilled Therapeutic Interventions/Progress Updates: Pt presented in w/c agreeable to therapy. Propelled to ortho gym mod I. Ambulated on mat for uneven surface with supervision. Pt request to continue practicing ambulation as currently does not have any concerns regarding set up once home. Pt ambulated 172f with close supervision and RW. No episodes of knee buckling during gait. Performed x2 6in steps with 2 rails ascend forward, descend backwards. NuStep L3 x 10 min LE only for strengthening. Performed squat pivot to/from NuStep mod I from w/c. Pt propelled self back to room mod I and remained in w/c at end of session with needs met.       Therapy Documentation Precautions:  Precautions Precautions: Back Precaution Booklet Issued: No Precaution Comments: reviewed back precautions with pt  Required Braces or Orthoses: Spinal Brace Spinal Brace: Thoracolumbosacral orthotic, Applied in sitting position Restrictions Weight Bearing Restrictions: No General:   Vital Signs: Therapy Vitals Temp: 98.6 F (37 C) Temp Source: Oral Pulse Rate: 72 Resp: 18 BP: (!) 109/57 Patient Position (if appropriate): Sitting Oxygen Therapy SpO2: 99 % O2 Device: Not Delivered Pain: Pain Assessment Pain Assessment: 0-10 Pain Score: 0-No pain Pain Type: Acute pain Pain Location: Back Pain Orientation: Lower Pain Descriptors / Indicators: Aching Pain Frequency: Constant Pain Onset: With Activity Patients Stated Pain Goal: 0 Pain Intervention(s): Medication (See eMAR)   See Function Navigator for Current Functional Status.   Therapy/Group: Individual Therapy  Branden Shallenberger  Arelly Whittenberg,  PTA  01/03/2017, 12:25 PM

## 2017-01-03 NOTE — Progress Notes (Signed)
Physical Therapy Note  Patient Details  Name: Megan [name] MRN: [id number] Date of Birth: 1951-05-11 Today's Date: 01/03/2017  1410-1440, 30 min individual tx Pain: none per pt  Pt wearing TLSO, R AFO.   Advanced gait using RW in congested home setting, requiring side stepping L,R, and backwards.  Mod cues for sequencing, shorter R step length, and longer L step length, and staying within RW.  Max cues for safe movement of RW in tight area to avoid lifting it up.  Seated RLE neuro re-ed for heel taps on targets requiring hip abduction/adduction, x 10 x 2; glut sets 2 x 10. Pt left resting in w/c with all needs within reach.   See function navigator for current status.    Yaacov Koziol 01/03/2017, 2:28 PM

## 2017-01-03 NOTE — Progress Notes (Signed)
Occupational Therapy Discharge Summary  Patient Details  Name: Megan [name] MRN: [id number] Date of Birth: [birth year]   Patient has met 9 of 9 long term goals due to improved activity tolerance, improved balance and ability to compensate for deficits.  Patient to discharge at overall Supervision to mod I level.  Patient's care partner is independent to provide the necessary physical assistance at discharge.    Reasons goals not met: n/a  Recommendation:  Patient will benefit from ongoing skilled OT services in home health setting to continue to advance functional skills in the area of iADL.  Equipment: No equipment provided  Reasons for discharge: treatment goals met  Patient/family agrees with progress made and goals achieved: Yes  OT Discharge Precautions/Restrictions  Precautions Precautions: Back Required Braces or Orthoses: Spinal Brace Spinal Brace: Thoracolumbosacral orthotic;Applied in sitting position Restrictions Weight Bearing Restrictions: No   Vital Signs Therapy Vitals Temp: 98.6 F (37 C) Temp Source: Oral Pulse Rate: 72 Resp: 18 BP: (!) 109/57 Patient Position (if appropriate): Sitting Oxygen Therapy SpO2: 99 % O2 Device: Not Delivered Pain Pain Assessment Pain Assessment: No/denies pain Pain Score: 0-No pain ADL  S for bathing, tub transfer, and toileting, mod I dressing and toilet transfers Vision/Perception  Vision- History Baseline Vision/History: Wears glasses Patient Visual Report: No change from baseline Vision- Assessment Eye Alignment: Within Functional Limits Perception Perception: Within Functional Limits Praxis Praxis: Intact  Cognition Overall Cognitive Status: Within Functional Limits for tasks assessed Arousal/Alertness: Awake/alert Orientation Level: Oriented X4 Sensation Sensation Light Touch: Impaired by gross assessment (RLE) Stereognosis: Appears Intact Hot/Cold: Appears Intact Additional Comments: Diminished  sensation through bilateral LEs, LLE pt able to localize light touch in distal leg/foot. Rt LE unable to locate light touch to great toe but was able to localize shin, fifth toe and heel  Coordination Gross Motor Movements are Fluid and Coordinated: No Fine Motor Movements are Fluid and Coordinated: Yes Coordination and Movement Description: impaired coordination on movement with LEs Motor  Motor Motor: Abnormal tone Motor - Discharge Observations: Mildly increased tone in Rt LE.  Mobility    refer to functional navigator Trunk/Postural Assessment  Cervical Assessment Cervical Assessment: Within Functional Limits Thoracic Assessment Thoracic Assessment: Within Functional Limits Lumbar Assessment Lumbar Assessment: Within Functional Limits Postural Control Postural Control: Within Functional Limits  Balance Balance Balance Assessed: Yes Static Sitting Balance Static Sitting - Balance Support: No upper extremity supported Static Sitting - Level of Assistance: 7: Independent Dynamic Sitting Balance Dynamic Sitting - Balance Support: No upper extremity supported Dynamic Sitting - Level of Assistance: 7: Independent Sitting balance - Comments: Pt able to reach forward and overhead without support and no LOB while sitting on EOB.  Static Standing Balance Static Standing - Balance Support: Bilateral upper extremity supported Static Standing - Level of Assistance: 6: Modified independent (Device/Increase time) Dynamic Standing Balance Dynamic Standing - Balance Support: During functional activity;Bilateral upper extremity supported Dynamic Standing - Level of Assistance: 5: Stand by assistance Extremity/Trunk Assessment RUE Assessment RUE Assessment: Within Functional Limits LUE Assessment LUE Assessment: Exceptions to [facility]   See Function Navigator for Current Functional Status.  [place] 01/03/2017, 12:51 PM

## 2017-01-03 NOTE — Progress Notes (Signed)
Occupational Therapy Session Note  Patient Details  Name: Megan [name] MRN: [id number] Date of Birth: [birth year]  Today's Date: 01/03/2017 OT Individual Time: 1105-1200 OT Individual Time Calculation (min): 55 min    Short Term Goals: Week 1:  OT Short Term Goal 1 (Week 1): Pt will perform toilet transfer with mod A in order to decrease level of assistance. OT Short Term Goal 1 - Progress (Week 1): Met OT Short Term Goal 2 (Week 1): Pt will maintain standing balance for 2 minutes during LB clothing management and hygiene with min A. OT Short Term Goal 2 - Progress (Week 1): Met OT Short Term Goal 3 (Week 1): Pt will perform LB dressing with mod A in order to decrease level of assistance. OT Short Term Goal 3 - Progress (Week 1): Met OT Short Term Goal 4 (Week 1): Pt will engage in 10 minutes of functional task with 2 rest breaks or less.  OT Short Term Goal 4 - Progress (Week 1): Met Week 2:  OT Short Term Goal 2 (Week 2): LTG=STG 2/2 estimated LOS  Skilled Therapeutic Interventions/Progress Updates:    Pt received in w/c fully dressed. Pt stated that she had sponged off from w/c level and completely dressed herself using her AE to prepare for the 2 PT sessions prior to this session.  Pt described in detail how she is able to accomplish her self care including the techniques to don her TED hose.  Pt explained her set up with her home bathtub that uses a slide board to her shower seat which she has used numerous times in the past.  Pt agreeable to ambulating to ADL apt to demonstrate her skills with transfers to tub and toilet.  Pt only needed S for the tub and mod I for the toilet.  Discussed bathing at night so she only needs to don a gown after the shower and the precautions with the TLSO to sit down on seat then doff brace and shirt, shower, don gown and brace prior to getting out.  She will need set up A from her spouse for handing her the brace and items so she does not break her  precautions. Ambulated back to her room and noted to pt that she tends to over grip the walker handles. With cues, pt relaxed her grip. In room, pt practiced and return demonstrated UE stretches for shoulders and wrists to do after ambulating more than a few minutes. Pt stated she is well prepared for home and ready to be discharged tomorrow.  Therapy Documentation Precautions:  Precautions Precautions: Back Precaution Booklet Issued: No Precaution Comments: reviewed back precautions with pt  Required Braces or Orthoses: Spinal Brace Spinal Brace: Thoracolumbosacral orthotic, Applied in sitting position Restrictions Weight Bearing Restrictions: No    Vital Signs: Therapy Vitals Temp: 98.6 F (37 C) Temp Source: Oral Pulse Rate: 72 Resp: 18 BP: (!) 109/57 Patient Position (if appropriate): Sitting Oxygen Therapy SpO2: 99 % O2 Device: Not Delivered Pain: Pain Assessment Pain Assessment: No/denies pain Pain Score: 0-No pain ADL:   See Function Navigator for Current Functional Status.   Therapy/Group: Individual Therapy  Jonnelle Lawniczak 01/03/2017, 12:40 PM

## 2017-01-04 ENCOUNTER — Ambulatory Visit: Payer: BLUE CROSS/BLUE SHIELD | Admitting: Primary Care

## 2017-01-04 LAB — GLUCOSE, CAPILLARY: Glucose-Capillary: 100 mg/dL — ABNORMAL HIGH (ref 65–99)

## 2017-01-04 MED ORDER — SENNOSIDES-DOCUSATE SODIUM 8.6-50 MG PO TABS: 2.0000 | ORAL_TABLET | Freq: Two times a day (BID) | ORAL | Status: AC

## 2017-01-04 MED ORDER — DEXAMETHASONE 2 MG PO TABS: 2.0000 mg | ORAL_TABLET | Freq: Every day | ORAL | 0 refills | Status: DC

## 2017-01-04 MED ORDER — METHOCARBAMOL 750 MG PO TABS: 750.0000 mg | ORAL_TABLET | Freq: Four times a day (QID) | ORAL | 0 refills | Status: DC

## 2017-01-04 MED ORDER — GABAPENTIN 300 MG PO CAPS: 300.0000 mg | ORAL_CAPSULE | Freq: Three times a day (TID) | ORAL | 1 refills | Status: DC

## 2017-01-04 MED ORDER — PANTOPRAZOLE SODIUM 40 MG PO TBEC: 40.0000 mg | DELAYED_RELEASE_TABLET | Freq: Every day | ORAL | 1 refills | Status: DC

## 2017-01-04 MED ORDER — LISINOPRIL 10 MG PO TABS: 10.0000 mg | ORAL_TABLET | Freq: Every day | ORAL | 0 refills | Status: DC

## 2017-01-04 MED ORDER — HYDROCODONE-ACETAMINOPHEN 5-325 MG PO TABS: 1.0000 | ORAL_TABLET | ORAL | 0 refills | Status: DC | PRN

## 2017-01-04 MED ORDER — METFORMIN HCL 500 MG PO TABS: 500.0000 mg | ORAL_TABLET | Freq: Every day | ORAL | 1 refills | Status: DC

## 2017-01-04 MED ORDER — ATORVASTATIN CALCIUM 20 MG PO TABS: 20.0000 mg | ORAL_TABLET | Freq: Every evening | ORAL | 3 refills | Status: DC

## 2017-01-04 MED ORDER — VITAMIN D3 25 MCG (1000 UNIT) PO TABS: 1000.0000 [IU] | ORAL_TABLET | Freq: Every day | ORAL | 1 refills | Status: DC

## 2017-01-04 MED ORDER — CELECOXIB 200 MG PO CAPS: 200.0000 mg | ORAL_CAPSULE | Freq: Two times a day (BID) | ORAL | 0 refills | Status: DC

## 2017-01-04 NOTE — Progress Notes (Signed)
Subjective/Complaints:  Slept well. No pain c/os off oxyCR last noc   ROS: pt denies nausea, vomiting,cough, shortness of breath or chest pain   Objective: Vital Signs: Blood pressure (!) 152/68, pulse 81, temperature 98.1 F (36.7 C), temperature source Oral, resp. rate 18, weight 78.4 kg (172 lb 13.5 oz), SpO2 99 %. No results found. Results for orders placed or performed during the hospital encounter of 12/21/16 (from the past 72 hour(s))  Glucose, capillary     Status: Abnormal   Collection Time: 01/01/17 12:08 PM  Result Value Ref Range   Glucose-Capillary 130 (H) 65 - 99 mg/dL   Comment 1 Notify RN   Glucose, capillary     Status: Abnormal   Collection Time: 01/01/17  4:08 PM  Result Value Ref Range   Glucose-Capillary 246 (H) 65 - 99 mg/dL   Comment 1 Notify RN   Glucose, capillary     Status: Abnormal   Collection Time: 01/01/17  9:31 PM  Result Value Ref Range   Glucose-Capillary 137 (H) 65 - 99 mg/dL   Comment 1 Notify RN   Glucose, capillary     Status: None   Collection Time: 01/02/17  6:22 AM  Result Value Ref Range   Glucose-Capillary 80 65 - 99 mg/dL   Comment 1 Notify RN   Glucose, capillary     Status: Abnormal   Collection Time: 01/02/17 11:56 AM  Result Value Ref Range   Glucose-Capillary 130 (H) 65 - 99 mg/dL   Comment 1 Notify RN   Glucose, capillary     Status: Abnormal   Collection Time: 01/02/17  4:16 PM  Result Value Ref Range   Glucose-Capillary 187 (H) 65 - 99 mg/dL   Comment 1 Notify RN   Glucose, capillary     Status: Abnormal   Collection Time: 01/02/17  8:40 PM  Result Value Ref Range   Glucose-Capillary 139 (H) 65 - 99 mg/dL   Comment 1 Notify RN   Glucose, capillary     Status: Abnormal   Collection Time: 01/03/17  6:16 AM  Result Value Ref Range   Glucose-Capillary 101 (H) 65 - 99 mg/dL   Comment 1 Notify RN   Glucose, capillary     Status: Abnormal   Collection Time: 01/03/17 11:15 AM  Result Value Ref Range    Glucose-Capillary 141 (H) 65 - 99 mg/dL   Comment 1 Notify RN   Glucose, capillary     Status: Abnormal   Collection Time: 01/03/17  4:13 PM  Result Value Ref Range   Glucose-Capillary 174 (H) 65 - 99 mg/dL  Glucose, capillary     Status: Abnormal   Collection Time: 01/03/17  8:24 PM  Result Value Ref Range   Glucose-Capillary 203 (H) 65 - 99 mg/dL   Comment 1 Notify RN   Glucose, capillary     Status: Abnormal   Collection Time: 01/04/17  6:18 AM  Result Value Ref Range   Glucose-Capillary 100 (H) 65 - 99 mg/dL   Comment 1 Notify RN      HEENT: normal Cardio: RRR Resp:CTA B with normal effort GI: BS positive and NT, ND Extremity:  No Edema Skin:   Honeycomb dressing removed Incision with some dried blood no drainage, erythema or fluctuance Neuro: Alert/Oriented, Abnormal Sensory Normal sensation in BUE, absent right L5 sensation to LT as well as right L3 and Abnormal Motor 5/5 in BUE, 3- Right knee ext ,0/5 Right ankle DF, LLE 3/5 KE 3- ADF, 3  L Hip ext--exam unchanged Musc/Skel:  Other no pain with UE or LE ROM, full AROM in BUE Gen NAD   Assessment/Plan: 1. Functional deficits secondary to paraparesis  Stable for D/C today F/u PCP in 3-4 weeks F/u PM&R 2 weeks See D/C summary See D/C instructions FIM: Function - Bathing Position: Wheelchair/chair at sink Body parts bathed by patient: Right arm, Left arm, Chest, Abdomen, Front perineal area, Buttocks, Right upper leg, Left upper leg, Right lower leg, Left lower leg, Back Body parts bathed by helper: Back Bathing not applicable: Left lower leg, Right lower leg Assist Level: More than reasonable time  Function- Upper Body Dressing/Undressing What is the patient wearing?: Pull over shirt/dress, Orthosis, Bra Bra - Perfomed by patient: Thread/unthread right bra strap, Thread/unthread left bra strap, Hook/unhook bra (pull down sports bra) Bra - Perfomed by helper: Hook/unhook bra (pull down sports bra) Pull over  shirt/dress - Perfomed by patient: Thread/unthread right sleeve, Put head through opening, Thread/unthread left sleeve, Pull shirt over trunk Pull over shirt/dress - Perfomed by helper: Thread/unthread right sleeve, Thread/unthread left sleeve, Put head through opening, Pull shirt over trunk Orthosis activity level: Performed by patient Assist Level: More than reasonable time Set up : To obtain clothing/put away Function - Lower Body Dressing/Undressing What is the patient wearing?: Underwear, Pants, AFO, Maryln Manuel, Shoes Position: Education officer, museum at [employer] - Performed by patient: Thread/unthread right underwear leg, Thread/unthread left underwear leg, Pull underwear up/down Underwear - Performed by helper: Thread/unthread right underwear leg, Thread/unthread left underwear leg, Pull underwear up/down Pants- Performed by patient: Thread/unthread right pants leg, Thread/unthread left pants leg, Pull pants up/down Pants- Performed by helper: Thread/unthread right pants leg, Thread/unthread left pants leg, Pull pants up/down Non-skid slipper socks- Performed by patient: Don/doff right sock, Don/doff left sock Non-skid slipper socks- Performed by helper: Don/doff right sock, Don/doff left sock Socks - Performed by patient: Don/doff right sock, Don/doff left sock Socks - Performed by helper: Don/doff right sock, Don/doff left sock Shoes - Performed by patient: Don/doff right shoe, Don/doff left shoe, Fasten right, Fasten left Shoes - Performed by helper: Don/doff right shoe AFO - Performed by patient: Don/doff right AFO TED Hose - Performed by patient: Don/doff right TED hose, Don/doff left TED hose TED Hose - Performed by helper: Don/doff left TED hose, Don/doff right TED hose Assist for footwear: Setup Assist for lower body dressing: More than reasonable time Assistive Device Comment: reacher, shoe horn, shoe button  Function - Toileting Toileting steps completed by patient: Adjust  clothing prior to toileting, Performs perineal hygiene, Adjust clothing after toileting Toileting steps completed by helper: Adjust clothing prior to toileting, Adjust clothing after toileting Toileting Assistive Devices: Grab bar or rail Assist level: Supervision or verbal cues  Function Midwife transfer assistive device: Elevated toilet seat/BSC over toilet, Environmental manager lift: Stedy Assist level to toilet: Supervision or verbal cues Assist level from toilet: Supervision or verbal cues  Function - Chair/bed transfer Chair/bed transfer method: Ambulatory Chair/bed transfer assist level: No Help, no cues, assistive device, takes more than a reasonable amount of time Chair/bed transfer assistive device: Walker, Marine scientist lift: Stedy Chair/bed transfer details: Verbal cues for precautions/safety, Tactile cues for posture, Verbal cues for technique  Function - Locomotion: Wheelchair Will patient use wheelchair at discharge?: Yes Type: Manual Max wheelchair distance: 210 Assist Level: No help, No cues, assistive device, takes more than reasonable amount of time Assist Level: No help, No cues, assistive device, takes more than reasonable  amount of time Assist Level: No help, No cues, assistive device, takes more than reasonable amount of time Turns around,maneuvers to table,bed, and toilet,negotiates 3% grade,maneuvers on rugs and over doorsills: Yes Function - Locomotion: Ambulation Ambulation activity did not occur: Safety/medical concerns Assistive device: Walker-rolling Max distance: 160 ft Assist level: Supervision or verbal cues Walk 10 feet activity did not occur: Safety/medical concerns Assist level: Supervision or verbal cues Walk 50 feet with 2 turns activity did not occur: Safety/medical concerns Assist level: Supervision or verbal cues Walk 150 feet activity did not occur: Safety/medical concerns Assist level: Supervision or verbal cues Walk  10 feet on uneven surfaces activity did not occur: Safety/medical concerns Assist level: Supervision or verbal cues  Function - Comprehension Comprehension: Auditory Comprehension assist level: Follows complex conversation/direction with no assist  Function - Expression Expression: Verbal Expression assist level: Expresses complex ideas: With no assist  Function - Social Interaction Social Interaction assist level: Interacts appropriately with others - No medications needed.  Function - Problem Solving Problem solving assist level: Solves complex problems: Recognizes & self-corrects  Function - Memory Memory assist level: Complete Independence: No helper Patient normally able to recall (first 3 days only): Current season, Location of own room, Staff names and faces, That he or she is in a hospital  Medical Problem List and Plan: 1. 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 Back brace when out of bed D/C home today,2. DVT Prophylaxis/Anticoagulation: SCDs. Dopplers negative 3. Pain Management:OxyContin sustained release 20 mg every 12 hours, will d/c after am dose Celebrex 200 mg every every 12 hours, Neurontin 300 mg 3 times a day, Robaxin 750 mg 4 times a day, Hydrocodone as needed 4. Mood: Provide emotional support 5. Neuropsych: This patient iscapable of making decisions on herown behalf. 6. Skin/Wound Care: Routine skin checks 7. Fluids/Electrolytes/Nutrition: encourage PO 8.Diabetes mellitus. Glucophage 500 mg twice a day. Check blood sugars before meals and at bedtime.   -cover with SSI while tapering steroids, sugars higher in PM  -decadron decr to '6mg'$  daily as of 4/16, CBG going down, change metformin to qam, ask pt to have hs snack, cont to wean decadron to '4mg'$  CBG (last 3)   Recent Labs  01/03/17 1613 01/03/17 2024 01/04/17 0618   GLUCAP 174* 203* 100*    9.Hypertension. Lisinopril 10 mg daily. Vitals:   01/03/17 1211 01/04/17 0500  BP: (!) 109/57 (!) 152/68  Pulse: 72 81  Resp: 18   Temp: 98.6 F (37 C) 98.1 F (36.7 C)   10.Hyperlipidemia. Lipitor 11.Neurogenic bladder.   - -ua  ucx >100K Kleb Sens to Cipro 7 d course completed   12.Constipation. Neurogenic bowel,  -senokot-s increased to 2 tablets twice a day  -last BM 4/16  -Dulcolax suppository daily 13.GERD. Protonix  LOS (Days) 14 A FACE TO FACE EVALUATION WAS PERFORMED  Bambi Fehnel E 01/04/2017, 7:40 AM

## 2017-01-04 NOTE — Discharge Instructions (Signed)
Inpatient Rehab Discharge Instructions  [place] Discharge date and time: No discharge date for patient encounter.   Activities/Precautions/ Functional Status: Activity: Back brace when out of bed Diet: diabetic diet Wound Care: keep wound clean and dry Functional status:  ___ No restrictions     ___ Walk up steps independently ___ 24/7 supervision/assistance   ___ Walk up steps with assistance ___ Intermittent supervision/assistance  ___ Bathe/dress independently ___ Walk with walker     _x__ Bathe/dress with assistance ___ Walk Independently    ___ Shower independently ___ Walk with assistance    ___ Shower with assistance ___ No alcohol     ___ Return to work/school ________  COMMUNITY REFERRALS UPON DISCHARGE:   Home Health:   PT     OT     Agency:  [facility] Phone:  [phone] Medical Equipment/Items Ordered:  You have all recommended equipment at home already.  Special Instructions:    My questions have been answered and I understand these instructions. I will adhere to these goals and the provided educational materials after my discharge from the hospital.  Patient/Caregiver Signature _______________________________ Date __________  Clinician Signature _______________________________________ Date __________  Please bring this form and your medication list with you to all your follow-up doctor's appointments.

## 2017-01-04 NOTE — Progress Notes (Signed)
Patient discharged home.  Left floor via wheelchair, escorted by nursing staff and family.  Patient and family verbalized understanding of discharge instructions as given by Marlowe Shores, PA.  All patient belongings sent with patient, including DME and prescriptions.  Patient appears to be in no immediate distress at this time.  Brita Romp, RN

## 2017-01-05 ENCOUNTER — Telehealth: Payer: Self-pay

## 2017-01-05 NOTE — Telephone Encounter (Signed)
Transitional Care call  Patient name: Megan [name]  ) DOB: ( [birth year]  ) 1. Are you/is patient experiencing any problems since coming home? ( n  ) a. Are there any questions regarding any aspect of care? ( n  ) 2. Are there any questions regarding medications administration/dosing? ( N  ) a. Are meds being taken as prescribed? ( N  ) b. "Patient should review meds with caller to confirm"  3. Have there been any falls? ( Y, fell yesterday, no injury  ) 4. Has Home Health been to the house and/or have they contacted you? ( Y  ) a. If not, have you tried to contact them? ( N  ) b. Can we help you contact them? ( N  ) 5. Are bowels and bladder emptying properly? ( Y ) a. Are there any unexpected incontinence issues? ( N  ) b. If applicable, is patient following bowel/bladder programs? ( N  ) 6. Any fevers, problems with breathing, unexpected pain? ( N  ) 7. Are there any skin problems or new areas of breakdown? ( N  ) 8. Has the patient/family member arranged specialty MD follow up (ie cardiology/neurology/renal/surgical/etc.)?  ( Y  ) a. Can we help arrange? ( N  ) 9. Does the patient need any other services or support that we can help arrange? ( N  ) 10. Are caregivers following through as expected in assisting the patient? ( N  ) 11. Has the patient quit smoking, drinking alcohol, or using drugs as recommended? ( N/A  )  Appointment date/time( 01-07-17 / 10:45am  ), arrive time( 10:15am  )and who it is with here( Dr. Letta Pate  ) [place]

## 2017-01-06 ENCOUNTER — Telehealth: Payer: Self-pay

## 2017-01-06 NOTE — Progress Notes (Addendum)
Social Work Discharge Note  The overall goal for the admission was met for:   Discharge location: Yes - home  Length of Stay: Yes  Discharge activity level: Yes - supervision  Home/community participation: Yes  Services provided included: MD, RD, PT, OT, RN, Pharmacy and SW  Financial Services: Private Insurance: [employer]  Follow-up services arranged: Home Health: PT/OT and Patient/Family has no preference for HH/DME agencies  [facility].  Pt already had all recommended DME at home.  Comments (or additional information):  Pt's husband feels prepared to care for pt at home and has already been doing a lot for pt PTA.  Pt also feels ready to go home and is very appreciative of her care on CIR.    Patient/Family verbalized understanding of follow-up arrangements: Yes  Individual responsible for coordination of the follow-up plan: pt with her husband  Confirmed correct DME delivered: Trey Sailors 01/06/2017    Shan Padgett, Silvestre Mesi

## 2017-01-06 NOTE — Telephone Encounter (Signed)
Sharol Roussel OT with [facility] left v/m; did HH OT assessment today and findings were no need for further [facility] OT.

## 2017-01-07 ENCOUNTER — Encounter: Payer: BLUE CROSS/BLUE SHIELD | Attending: Physical Medicine & Rehabilitation

## 2017-01-07 ENCOUNTER — Ambulatory Visit (HOSPITAL_BASED_OUTPATIENT_CLINIC_OR_DEPARTMENT_OTHER): Payer: BLUE CROSS/BLUE SHIELD | Admitting: Physical Medicine & Rehabilitation

## 2017-01-07 ENCOUNTER — Encounter: Payer: Self-pay | Admitting: Physical Medicine & Rehabilitation

## 2017-01-07 VITALS — BP 136/72 | HR 74

## 2017-01-07 DIAGNOSIS — G8222 Paraplegia, incomplete: Secondary | ICD-10-CM | POA: Diagnosis not present

## 2017-01-07 DIAGNOSIS — M4714 Other spondylosis with myelopathy, thoracic region: Secondary | ICD-10-CM

## 2017-01-07 DIAGNOSIS — G959 Disease of spinal cord, unspecified: Secondary | ICD-10-CM | POA: Insufficient documentation

## 2017-01-07 NOTE — Telephone Encounter (Signed)
Noted  

## 2017-01-07 NOTE — Patient Instructions (Signed)
Once you finish the hydrocodone's, please restart the tramadol

## 2017-01-07 NOTE — Progress Notes (Signed)
Subjective:    Patient ID: Megan [name], female    DOB: 1950-11-08, 66 y.o.   MRN: [id number] 66 year old right-handed female history of diabetes mellitus, hyperlipidemia, lumbar fusion 2016 and 17 as well as cervical fusion, 2001.  She lives with spouse, had been using a scooter for household and community ambulation secondary to bilateral lower extremity weakness for the past few weeks.  Husband works during the day as well as a local daughter.  Presented December 18, 2016, with long- standing history of neck and back pain, progressive paraparesis.  Denied any bowel or bladder dysfunction.  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  Underwent left T10 costotransversectomy, T10-T11 laminectomy for decompression, pedicle screws, T9-T12, December 19, 2016, per Dr. Cyndy Freeze.   DATE OF ADMISSION:  12/21/2016 DATE OF DISCHARGE:  01/04/2017  HPI  Transitional Care. Phone call completed Did not bring hydrocodone today but says she is taking it when needed Rachel in chart Fell x 1 and bumped head, no tx Sees Dr Cyndy Freeze 01/11/17, Dr Carlis Abbott PCP 01/19/17  Has been home 3 days, PT, OT  Came out , OT didn't rec services, Mod I ADL except for R AFO brace   Bowel movement yesterday no laxative except stool softener No cath since leaving hospital Hx spastic bladder was chronically on oxytrol patch Pain Inventory Average Pain 2 Pain Right Now 4 My pain is burning, dull and aching  In the last 24 hours, has pain interfered with the following? General activity 3 Relation with others 0 Enjoyment of life 4 What TIME of day is your pain at its worst? evening Sleep (in general) Fair  Pain is worse with: sitting and some activites Pain improves with: rest and medication Relief from Meds: 10  Mobility use a walker transfers alone  Function I need assistance with the following:  dressing, bathing, meal prep, household duties and  shopping  Neuro/Psych bladder control problems numbness tingling trouble walking  Prior Studies Any changes since last visit?  no  Physicians involved in your care Any changes since last visit?  no   Family History  Problem Relation Age of Onset  . Diabetes Mother   . Dementia Mother   . Cirrhosis Mother     Non alcoholic  . COPD Father   . Diabetes Brother    Social History   Social History  . Marital status: Married    Spouse name: [name]  . Number of children: 1  . Years of education: 62   Social History Main Topics  . Smoking status: Former Smoker    Packs/day: 1.50    Types: Cigarettes    Quit date: 05/13/2006  . Smokeless tobacco: Never Used  . Alcohol use 0.0 oz/week     Comment: once in a while  . Drug use: No  . Sexual activity: Not Asked   Other Topics Concern  . None   Social History Narrative   Lives w/ husband   Married.   1 child, 1 grandchildren.   Retired. Once worked for [employer].   Enjoys reading.    Past Surgical History:  Procedure Laterality Date  . ABDOMINAL HYSTERECTOMY  1979  . APPLICATION OF ROBOTIC ASSISTANCE FOR SPINAL PROCEDURE  12/19/2016   Procedure: APPLICATION OF ROBOTIC ASSISTANCE FOR SPINAL PROCEDURE;  Surgeon: Kevan Ny Ditty, MD;  Location: [place];  Service: Neurosurgery;;  . blADder tacked   1991  . CERVICAL FUSION  2001  . [place]  . LUMBAR FUSION  2016   L4-5  . LUMBAR FUSION  2017   L2-3  . TONSILLECTOMY AND ADENOIDECTOMY  1959  . TUMOR EXCISION     WERTHIN'S TUMORS BOTH SIDES OF NECK   Past Medical History:  Diagnosis Date  . Arthritis   . Borderline diabetic   . GERD (gastroesophageal reflux disease)   . History of bronchitis 2005  . History of kidney stones   . History of shingles   . Hyperlipidemia    takes Fish Oil daily  . Neuromuscular disorder (HCC)    tingling toes  . 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[place]) 12/15/2016  . Pneumonia 2009  . PONV (postoperative nausea and  vomiting)   . Restless leg   . Type 2 diabetes mellitus ([place])   . Weakness    numbness and tingling in both feet r/t back   BP 136/72   Pulse 74   SpO2 99%   Opioid Risk Score:   Fall Risk Score:  `1  Depression screen PHQ 2/9  Depression screen PHQ 2/9 01/07/2017  Decreased Interest 0  Down, Depressed, Hopeless 0  PHQ - 2 Score 0  Altered sleeping 0  Tired, decreased energy 1  Change in appetite 0  Feeling bad or failure about yourself  0  Trouble concentrating 0  Moving slowly or fidgety/restless 0  Suicidal thoughts 0  PHQ-9 Score 1    Review of Systems  Constitutional: Negative.   HENT: Negative.   Eyes: Negative.   Respiratory: Negative.   Cardiovascular: Negative.   Gastrointestinal: Negative.   Endocrine: Negative.   Genitourinary:       Bladder control  Musculoskeletal: Positive for gait problem.  Skin: Negative.   Allergic/Immunologic: Negative.   Neurological: Positive for numbness.       Tingling  Hematological: Negative.   Psychiatric/Behavioral: Negative.   All other systems reviewed and are negative.      Objective:   Physical Exam  Constitutional: She is oriented to person, place, and time. She appears well-developed and well-nourished. No distress.  HENT:  Head: Normocephalic and atraumatic.  Eyes: Conjunctivae and EOM are normal. Pupils are equal, round, and reactive to light.  Neck: Normal range of motion. Neck supple.  Cardiovascular: Normal rate, regular rhythm and normal heart sounds.  Exam reveals no friction rub.   No murmur heard. Pulmonary/Chest: Effort normal and breath sounds normal. No respiratory distress. She has no wheezes.  Abdominal: Soft. Bowel sounds are normal. She exhibits no distension. There is no tenderness.  Neurological: She is alert and oriented to person, place, and time.  Skin: She is not diaphoretic.  Psychiatric: She has a normal mood and affect. Her behavior is normal. Judgment and thought content normal.    Nursing note and vitals reviewed.  No sensation to LT below knee on Right side Left lower limb intact light touch sensation  Motor strength is 5/5 bilateral deltoid, biceps, triceps, grip Right lower extremity. Trace hip flexion, 3 minus. Knee extension, 0 ankle dorsiflexion, 2 minus, right toe extension, 2 minus, ankle plantar flexion Left lower limb for in the left hip flexor, knee extensor, ankle dorsiflexor and plantar flexor      Assessment & Plan:  1. Paraplegia. Thoracic myelopathy with improvement in sensation, and lower extremity strength. She continues to require home health rehabilitation with PT. Return to physical medicine rehabilitation clinic in 4-6 weeks at which time we'll transition to outpatient therapy.  Has appointment to follow-up with neurosurgery, Dr. Cyndy Freeze May 1  2. Neurogenic bowel, resolved  3. Neurogenic bladder with retention, resolved, she has underlying problem with spastic bladder, which is chronic and is back on her oxybutynin patch  . 4. Postoperative pain. She is gradually reducing her hydrocodone, down to 5 mg twice a day. She still has quite a few tablets left from the hospital prescription and will likely finish this out without a need for refill.. She may resume the tramadol that she has at home, prescribed by Dr. Joya Salm if she needs some other pain medication. Once the hydrocodone runs out.

## 2017-01-13 ENCOUNTER — Telehealth: Payer: Self-pay | Admitting: Primary Care

## 2017-01-13 NOTE — Telephone Encounter (Signed)
Called patient to check on whether she wanted second opinion or not after seeing Neurosurgeon. She saw Dr Jannifer [facility] and he ordered MRI's. He contacted Dr Cyndy Freeze at [facility] and she was sent the same day to [facility] as she had her MRI's. She had surgery the next day. She is doing well now and will be seeing you next week. I am cancelling the referral you have placed.

## 2017-01-13 NOTE — Telephone Encounter (Signed)
Noted  

## 2017-01-19 ENCOUNTER — Encounter: Payer: Self-pay | Admitting: Primary Care

## 2017-01-19 ENCOUNTER — Ambulatory Visit (INDEPENDENT_AMBULATORY_CARE_PROVIDER_SITE_OTHER): Payer: BLUE CROSS/BLUE SHIELD | Admitting: Primary Care

## 2017-01-19 DIAGNOSIS — E785 Hyperlipidemia, unspecified: Secondary | ICD-10-CM | POA: Diagnosis not present

## 2017-01-19 DIAGNOSIS — E119 Type 2 diabetes mellitus without complications: Secondary | ICD-10-CM | POA: Diagnosis not present

## 2017-01-19 DIAGNOSIS — I10 Essential (primary) hypertension: Secondary | ICD-10-CM | POA: Diagnosis not present

## 2017-01-19 DIAGNOSIS — M4714 Other spondylosis with myelopathy, thoracic region: Secondary | ICD-10-CM | POA: Diagnosis not present

## 2017-01-19 NOTE — Assessment & Plan Note (Signed)
Stable in the office today, continue lisinopril 10 mg.

## 2017-01-19 NOTE — Assessment & Plan Note (Signed)
Due for repeat lipids in late May 2018.

## 2017-01-19 NOTE — Assessment & Plan Note (Signed)
Due for repeat A1C in late May 2018.

## 2017-01-19 NOTE — Progress Notes (Signed)
Subjective:    Patient ID: Megan [name], female    DOB: 1951-03-17, 66 y.o.   MRN: [id number]  HPI  [name] is a 66 year old female who presents today for hospital follow up.  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   Her operation went well without complications. She was discharged to rehabilitation on 12/21/16. She underwent PT/OT during her time in rehabilitation and was discharged home on 01/03/17.  Since her discharge home she's followed up with her neurosurgeon on Monday this week, and her rehabilitation doctor two weeks ago. She's participating in home physical therapy and is doing well. She's regained slight movement and sensation to her right lower extremity, continues to have complete numbness to the knee and above. Her pain is well controlled on Tramadol. She is motivated to improve and feels very satisfied with treatment.  Review of Systems  Respiratory: Negative for shortness of breath.   Cardiovascular: Negative for chest pain.  Musculoskeletal:       Pain well controlled on Tramadol.  Neurological: Positive for weakness and numbness. Negative for dizziness.       Past Medical History:  Diagnosis Date  . Arthritis   . Borderline diabetic   . GERD (gastroesophageal reflux disease)   . History of bronchitis 2005  . History of kidney stones   . History of shingles   . Hyperlipidemia    takes Fish Oil daily  . Neuromuscular disorder (HCC)    tingling toes  . 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[place]) 12/15/2016  . Pneumonia 2009  . PONV (postoperative nausea and vomiting)   . Restless leg   . Type 2 diabetes mellitus ([place])   . Weakness    numbness and tingling in both feet r/t back     Social  History   Social History  . Marital status: Married    Spouse name: Megan [name]  . Number of children: 1  . Years of education: 25   Occupational History  . Not on file.   Social History Main Topics  . Smoking status: Former Smoker    Packs/day: 1.50    Types: Cigarettes    Quit date: 05/13/2006  . Smokeless tobacco: Never Used  . Alcohol use 0.0 oz/week     Comment: once in a while  . Drug use: No  . Sexual activity: Not on file   Other Topics Concern  . Not on file   Social History Narrative   Lives w/ husband   Married.   1 child, 1 grandchildren.   Retired. Once worked for [employer].   Enjoys reading.     Past Surgical History:  Procedure Laterality Date  . ABDOMINAL HYSTERECTOMY  1979  . APPLICATION OF ROBOTIC ASSISTANCE FOR SPINAL PROCEDURE  12/19/2016   Procedure: APPLICATION OF ROBOTIC ASSISTANCE FOR SPINAL PROCEDURE;  Surgeon: Kevan Ny Ditty, MD;  Location: [place];  Service: Neurosurgery;;  . blADder tacked   1991  . CERVICAL FUSION     2001  . HERNIA REPAIR  1991  . LUMBAR FUSION  2016   L4-5  . LUMBAR FUSION  2017   L2-3  . TONSILLECTOMY AND ADENOIDECTOMY  1959  . TUMOR EXCISION     WERTHIN'S TUMORS BOTH SIDES OF NECK    Family History  Problem  Relation Age of Onset  . Diabetes Mother   . Dementia Mother   . Cirrhosis Mother     Non alcoholic  . COPD Father   . Diabetes Brother     Allergies  Allergen Reactions  . Shellfish Allergy Anaphylaxis, Swelling and Other (See Comments)    Tongue swells  . Oxycodone Nausea Only  . Penicillins Rash    Has patient had a PCN reaction causing immediate rash, facial/tongue/throat swelling, SOB or lightheadedness with hypotension: Yes Has patient had a PCN reaction causing severe rash involving mucus membranes or skin necrosis: No Has patient had a PCN reaction that required hospitalization No Has patient had a PCN reaction occurring within the last 10 years: No If all of the above answers are "NO",  then may proceed with Cephalosporin use.    Current Outpatient Prescriptions on File Prior to Visit  Medication Sig Dispense Refill  . atorvastatin (LIPITOR) 20 MG tablet Take 1 tablet (20 mg total) by mouth every evening. 90 tablet 3  . celecoxib (CELEBREX) 200 MG capsule Take 1 capsule (200 mg total) by mouth every 12 (twelve) hours. 60 capsule 0  . cholecalciferol (VITAMIN D) 1000 units tablet Take 1 tablet (1,000 Units total) by mouth daily after lunch. 30 tablet 1  . Coenzyme Q10 (COQ10 PO) Take 1 capsule by mouth daily after supper.    . Cranberry 500 MG TABS Take 500 mg by mouth daily after supper.    . dexamethasone (DECADRON) 2 MG tablet Take 1 tablet (2 mg total) by mouth daily. 10 tablet 0  . gabapentin (NEURONTIN) 300 MG capsule Take 1 capsule (300 mg total) by mouth 3 (three) times daily. 90 capsule 1  . lisinopril (PRINIVIL,ZESTRIL) 10 MG tablet Take 1 tablet (10 mg total) by mouth daily. 30 tablet 0  . metFORMIN (GLUCOPHAGE) 500 MG tablet Take 1 tablet (500 mg total) by mouth daily with breakfast. 30 tablet 1  . methocarbamol (ROBAXIN) 750 MG tablet Take 1 tablet (750 mg total) by mouth 4 (four) times daily. 120 tablet 0  . Multiple Vitamin (MULTIVITAMIN WITH MINERALS) TABS tablet Take 1 tablet by mouth daily after lunch.     . pantoprazole (PROTONIX) 40 MG tablet Take 1 tablet (40 mg total) by mouth daily. 30 tablet 1  . senna-docusate (SENOKOT-S) 8.6-50 MG tablet Take 2 tablets by mouth 2 (two) times daily.     No current facility-administered medications on file prior to visit.     BP 116/64   Pulse 70   Temp 98.8 F (37.1 C) (Oral)   Wt 160 lb (72.6 kg)   SpO2 98%   BMI 24.33 kg/m    Objective:   Physical Exam  Constitutional: She appears well-nourished.  Neck: Neck supple.  Cardiovascular: Normal rate and regular rhythm.   Pulmonary/Chest: Effort normal and breath sounds normal.  Musculoskeletal:  Able to outstretch right lower extremity to a 45 degree  angle. Able to dorsoflex today. Significant improvement when compared to last visit.  Skin: Skin is warm and dry.          Assessment & Plan:  Hospital Follow Up;  Admitted on 04/07, surgical intervention on 04/08. Discharged home from rehabilitation on 04/23. Participating in home PT/OT and doing well. Moderate improvement in ROM to right lower extremity, does have a long way to go before full recovery. Provided encouragement today. Continue home PT.  Sheral Flow, NP

## 2017-01-19 NOTE — Patient Instructions (Signed)
Return on May 21st for your fasting labs. I'll be in touch with you once I receive the results.  Continue Metformin 500 mg twice daily.  Continue physical therapy. You look great! It was a pleasure to see you today!

## 2017-01-19 NOTE — Assessment & Plan Note (Signed)
Moderate improvement in ROM to right lower extremity.  Continue home PT.

## 2017-01-19 NOTE — Progress Notes (Signed)
Pre visit review using our clinic review tool, if applicable. No additional management support is needed unless otherwise documented below in the visit note. 

## 2017-01-27 ENCOUNTER — Telehealth: Payer: Self-pay

## 2017-01-27 NOTE — Telephone Encounter (Signed)
Megan [name] PT [facility] called [phone]) and reported that she had a session with the patient and noted that her incision site is still open and draining

## 2017-01-27 NOTE — Telephone Encounter (Signed)
Megan [name] patient

## 2017-01-28 NOTE — Telephone Encounter (Signed)
Pt need to see Dr Cyndy Freeze her neurosurgeon

## 2017-01-31 ENCOUNTER — Other Ambulatory Visit: Payer: BLUE CROSS/BLUE SHIELD

## 2017-01-31 ENCOUNTER — Ambulatory Visit (INDEPENDENT_AMBULATORY_CARE_PROVIDER_SITE_OTHER): Payer: BLUE CROSS/BLUE SHIELD | Admitting: Family Medicine

## 2017-01-31 ENCOUNTER — Encounter: Payer: Self-pay | Admitting: Family Medicine

## 2017-01-31 ENCOUNTER — Other Ambulatory Visit (INDEPENDENT_AMBULATORY_CARE_PROVIDER_SITE_OTHER): Payer: BLUE CROSS/BLUE SHIELD

## 2017-01-31 VITALS — BP 140/70 | HR 70 | Temp 97.9°F | Ht 68.0 in | Wt 161.2 lb

## 2017-01-31 DIAGNOSIS — Z1159 Encounter for screening for other viral diseases: Secondary | ICD-10-CM

## 2017-01-31 DIAGNOSIS — E785 Hyperlipidemia, unspecified: Secondary | ICD-10-CM

## 2017-01-31 DIAGNOSIS — R3989 Other symptoms and signs involving the genitourinary system: Secondary | ICD-10-CM | POA: Insufficient documentation

## 2017-01-31 DIAGNOSIS — N319 Neuromuscular dysfunction of bladder, unspecified: Secondary | ICD-10-CM | POA: Diagnosis not present

## 2017-01-31 DIAGNOSIS — Z7289 Other problems related to lifestyle: Secondary | ICD-10-CM

## 2017-01-31 DIAGNOSIS — E119 Type 2 diabetes mellitus without complications: Secondary | ICD-10-CM | POA: Diagnosis not present

## 2017-01-31 DIAGNOSIS — R350 Frequency of micturition: Secondary | ICD-10-CM | POA: Diagnosis not present

## 2017-01-31 LAB — LIPID PANEL
CHOL/HDL RATIO: 3
Cholesterol: 169 mg/dL (ref 0–200)
HDL: 63.5 mg/dL (ref >39.00)
LDL Cholesterol: 87 mg/dL (ref 0–99)
NONHDL: 105.25
Triglycerides: 89 mg/dL (ref 0.0–149.0)
VLDL: 17.8 mg/dL (ref 0.0–40.0)

## 2017-01-31 LAB — POC URINALSYSI DIPSTICK (AUTOMATED)
Blood, UA: NEGATIVE
Glucose, UA: NEGATIVE
KETONES UA: NEGATIVE
Leukocytes, UA: NEGATIVE
Nitrite, UA: NEGATIVE
PH UA: 6 (ref 5.0–8.0)
PROTEIN UA: NEGATIVE
SPEC GRAV UA: 1.025 (ref 1.010–1.025)
Urobilinogen, UA: 0.2 E.U./dL

## 2017-01-31 LAB — HEMOGLOBIN A1C: Hgb A1c MFr Bld: 6.4 % (ref 4.6–6.5)

## 2017-01-31 NOTE — Addendum Note (Signed)
Addended by: Eliezer Lofts E on: 01/31/2017 09:03 AM   Modules accepted: Orders

## 2017-01-31 NOTE — Assessment & Plan Note (Signed)
Given neurogenic bladder and recent Klebsiella UTI.Marland Kitchen UA clear..will send for culture to make sure no bacterial infection ongoing. Pressure may be from bladder spasm or her back brace position. Avoid bladder irritants.

## 2017-01-31 NOTE — Telephone Encounter (Signed)
I called back to speak to the PT to see if she had been back out to see Megan [name].  She had not but see her tomorrow.  I was calling about wound and Megan [name] said that she called the neurosurgeon that same day and reported to them as well and Megan [name] is seeing him today.

## 2017-01-31 NOTE — Addendum Note (Signed)
Addended by: Daralene Milch C on: 01/31/2017 11:43 AM   Modules accepted: Orders

## 2017-01-31 NOTE — Progress Notes (Signed)
   Subjective:    Patient ID: Megan [name], female    DOB: 01-10-1951, 66 y.o.   MRN: [id number]  HPI  66 year old female with hx pf  incomplete paraplegia, thoracic myelopathy and neurogenic bladder presents with new onset heaviness in bladder.   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  Catheterized.  Had UTI in hospital after surgery. kelbsiella treated with cipro x 7 days  She reports in last 4-5 days she has noted heaviness in lower abdomen. No dysuria, no blodd in urine. Some increase in frequency, no urgency. No new incontinence  No flu-like symptoms, no fever. No emesis.  She is also wearing a back brace that pushes on her low abdomen and wonders if that is giving her the heaviness.  Review of Systems  Constitutional: Negative for fatigue and fever.  HENT: Negative for ear pain.   Eyes: Negative for pain.  Respiratory: Negative for chest tightness and shortness of breath.   Cardiovascular: Negative for chest pain, palpitations and leg swelling.  Gastrointestinal: Negative for abdominal pain.  Genitourinary: Negative for dysuria.       Objective:   Physical Exam  Constitutional: Vital signs are normal. She appears well-developed and well-nourished. She is cooperative.  Non-toxic appearance. She does not appear ill. No distress.  In wheelchair in back brace  HENT:  Head: Normocephalic.  Right Ear: Hearing, tympanic membrane, external ear and ear canal normal. Tympanic membrane is not erythematous, not retracted and not bulging.  Left Ear: Hearing, tympanic membrane, external ear and ear canal normal. Tympanic membrane is not erythematous, not retracted and not bulging.  Nose: No mucosal edema or rhinorrhea. Right sinus exhibits no maxillary sinus tenderness and no frontal sinus tenderness. Left sinus exhibits no maxillary sinus tenderness and no frontal sinus  tenderness.  Mouth/Throat: Uvula is midline, oropharynx is clear and moist and mucous membranes are normal.  Eyes: Conjunctivae, EOM and lids are normal. Pupils are equal, round, and reactive to light. Lids are everted and swept, no foreign bodies found.  Neck: Trachea normal and normal range of motion. Neck supple. Carotid bruit is not present. No thyroid mass and no thyromegaly present.  Cardiovascular: Normal rate, regular rhythm, S1 normal, S2 normal, normal heart sounds, intact distal pulses and normal pulses.  Exam reveals no gallop and no friction rub.   No murmur heard. Pulmonary/Chest: Effort normal and breath sounds normal. No tachypnea. No respiratory distress. She has no decreased breath sounds. She has no wheezes. She has no rhonchi. She has no rales.  Abdominal: Soft. Normal appearance and bowel sounds are normal. There is no hepatosplenomegaly. There is tenderness in the suprapubic area. There is no rigidity, no guarding and no CVA tenderness.  Neurological: She is alert.  Skin: Skin is warm, dry and intact. No rash noted.  Psychiatric: Her speech is normal and behavior is normal. Judgment and thought content normal. Her mood appears not anxious. Cognition and memory are normal. She does not exhibit a depressed mood.          Assessment & Plan:

## 2017-01-31 NOTE — Patient Instructions (Addendum)
Avoid bladder irritants. Alcohol, caffeine, citris, tomato, spicy foods etc. Push water intake.  We will call with culture results.

## 2017-01-31 NOTE — Addendum Note (Signed)
Addended by: Carter Kitten on: 01/31/2017 09:02 AM   Modules accepted: Orders

## 2017-02-01 ENCOUNTER — Encounter: Payer: Self-pay | Admitting: *Deleted

## 2017-02-01 LAB — HEPATITIS C ANTIBODY: HCV Ab: NEGATIVE

## 2017-02-02 ENCOUNTER — Telehealth: Payer: Self-pay | Admitting: *Deleted

## 2017-02-02 ENCOUNTER — Encounter: Payer: Self-pay | Admitting: *Deleted

## 2017-02-02 LAB — URINE CULTURE

## 2017-02-02 MED ORDER — SULFAMETHOXAZOLE-TRIMETHOPRIM 800-160 MG PO TABS: 1.0000 | ORAL_TABLET | Freq: Two times a day (BID) | ORAL | 0 refills | Status: DC

## 2017-02-02 NOTE — Telephone Encounter (Signed)
Ms. Politano notified urine culture did show E Coli bacteria.  Bactrim prescription sent into [facility] on [address] as instructed by K. Clark.

## 2017-02-08 ENCOUNTER — Telehealth: Payer: Self-pay

## 2017-02-08 NOTE — Telephone Encounter (Signed)
Approve wheelchair. Any special requests for the wheelchair? We can write an Rx for the chair, where should we fax it? Or do they wish to pick up the Rx?

## 2017-02-08 NOTE — Telephone Encounter (Signed)
[facility] called [phone]) requesting an extension of orders until the end of June.  Verbal orders recieved and approved

## 2017-02-08 NOTE — Telephone Encounter (Signed)
Spoken to patient. She stated there is no special request for the wheelchair just the standard. Patient is not sure how to proceeded with the Rx for the wheelchair. She stated that she will call [facility] and let me know.

## 2017-02-08 NOTE — Telephone Encounter (Signed)
Pt left v/m; pt was seen 01/31/17; pt said that PT recommends pt to have a w/c. Pt spoke with case mgr with ins co. And request Allie Bossier NP to approve pt getting a w/c.pt last seen by Angelina Ok NP 01/19/17; pt had back surgery 12/2016. Pt request cb.

## 2017-02-09 NOTE — Telephone Encounter (Signed)
Spoken to patient. Since the places that she mention in the comments below do not supply wheelchair. However she did mention [facility] so we can send the order to [facility] in [place] for the wheelchair.

## 2017-02-09 NOTE — Telephone Encounter (Signed)
Pt left v/m leaving name of co that ins will approve if have written rx. [facility] on [address] in [place]; another called [facility] on [address] in [place] and [facility]; [place].  Pt request cb

## 2017-02-09 NOTE — Telephone Encounter (Signed)
Noted. Prescription placed in [facility] in box, ready for faxing.

## 2017-02-10 NOTE — Telephone Encounter (Signed)
Faxed Rx and patient's info to [facility] at [phone]

## 2017-02-18 ENCOUNTER — Encounter: Payer: Self-pay | Admitting: Physical Medicine & Rehabilitation

## 2017-02-18 ENCOUNTER — Encounter: Payer: BLUE CROSS/BLUE SHIELD | Attending: Physical Medicine & Rehabilitation

## 2017-02-18 ENCOUNTER — Ambulatory Visit (HOSPITAL_BASED_OUTPATIENT_CLINIC_OR_DEPARTMENT_OTHER): Payer: BLUE CROSS/BLUE SHIELD | Admitting: Physical Medicine & Rehabilitation

## 2017-02-18 VITALS — BP 124/65 | HR 100

## 2017-02-18 DIAGNOSIS — G959 Disease of spinal cord, unspecified: Secondary | ICD-10-CM | POA: Insufficient documentation

## 2017-02-18 DIAGNOSIS — G8220 Paraplegia, unspecified: Secondary | ICD-10-CM

## 2017-02-18 MED ORDER — METHOCARBAMOL 750 MG PO TABS: 750.0000 mg | ORAL_TABLET | Freq: Three times a day (TID) | ORAL | 0 refills | Status: DC

## 2017-02-18 NOTE — Progress Notes (Signed)
Subjective:    Patient ID: Megan [name], female    DOB: 11-18-1950, 66 y.o.   MRN: [id number]  Underwent left T10 costotransversectomy, T10-T11 laminectomy for decompression, pedicle screws, T9-T12, December 19, 2016, per Dr. Cyndy Freeze.   DATE OF ADMISSION:  12/21/2016 DATE OF DISCHARGE:  01/04/2017  HPI Saw Dr Cyndy Freeze NS 5/1 , has another appt July 18th with Xray Needs help in and out of tub Dresses Mod I Microwaves snacks Still numb in the Right LE At baseline stress incont Pain Inventory Average Pain 4 Pain Right Now 3 My pain is intermittent, tingling and aching  In the last 24 hours, has pain interfered with the following? General activity 2 Relation with others 0 Enjoyment of life 3 What TIME of day is your pain at its worst? evening Sleep (in general) Fair  Pain is worse with: walking and standing Pain improves with: rest Relief from Meds: 1  Mobility use a walker ability to climb steps?  yes do you drive?  no use a wheelchair  Function retired  Neuro/Psych numbness tingling trouble walking spasms  Prior Studies Any changes since last visit?  no  Physicians involved in your care Any changes since last visit?  no   Family History  Problem Relation Age of Onset  . Diabetes Mother   . Dementia Mother   . Cirrhosis Mother        Non alcoholic  . COPD Father   . Diabetes Brother    Social History   Social History  . Marital status: Married    Spouse name: [name]  . Number of children: 1  . Years of education: 94   Social History Main Topics  . Smoking status: Former Smoker    Packs/day: 1.50    Types: Cigarettes    Quit date: 05/13/2006  . Smokeless tobacco: Never Used  . Alcohol use 0.0 oz/week     Comment: once in a while  . Drug use: No  . Sexual activity: Not Asked   Other Topics Concern  . None   Social History Narrative   Lives w/ husband   Married.   1 child, 1 grandchildren.   Retired. Once worked for [employer].   Enjoys  reading.    Past Surgical History:  Procedure Laterality Date  . ABDOMINAL HYSTERECTOMY  1979  . APPLICATION OF ROBOTIC ASSISTANCE FOR SPINAL PROCEDURE  12/19/2016   Procedure: APPLICATION OF ROBOTIC ASSISTANCE FOR SPINAL PROCEDURE;  Surgeon: Kevan Ny Ditty, MD;  Location: [facility];  Service: Neurosurgery;;  . blADder tacked   1991  . CERVICAL FUSION     2001  . HERNIA REPAIR  1991  . LUMBAR FUSION  2016   L4-5  . LUMBAR FUSION  2017   L2-3  . TONSILLECTOMY AND ADENOIDECTOMY  1959  . TUMOR EXCISION     WERTHIN'S TUMORS BOTH SIDES OF NECK   Past Medical History:  Diagnosis Date  . Arthritis   . Borderline diabetic   . GERD (gastroesophageal reflux disease)   . History of bronchitis 2005  . History of kidney stones   . History of shingles   . Hyperlipidemia    takes Fish Oil daily  . Neuromuscular disorder (HCC)    tingling toes  . 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[place]) 12/15/2016  . Pneumonia 2009  . PONV (postoperative nausea and vomiting)   . Restless leg   . Type 2 diabetes mellitus ([place])   . Weakness    numbness  and tingling in both feet r/t back   BP 124/65   Pulse 100   SpO2 97%   Opioid Risk Score:   Fall Risk Score:  `1  Depression screen PHQ 2/9  Depression screen PHQ 2/9 01/07/2017  Decreased Interest 0  Down, Depressed, Hopeless 0  PHQ - 2 Score 0  Altered sleeping 0  Tired, decreased energy 1  Change in appetite 0  Feeling bad or failure about yourself  0  Trouble concentrating 0  Moving slowly or fidgety/restless 0  Suicidal thoughts 0  PHQ-9 Score 1    Review of Systems  Constitutional: Positive for unexpected weight change.  HENT: Negative.   Eyes: Negative.   Respiratory: Negative.   Cardiovascular: Negative.   Gastrointestinal: Negative.   Endocrine: Negative.   Genitourinary: Negative.   Musculoskeletal: Negative.   Skin: Negative.   Allergic/Immunologic: Negative.   Neurological: Negative.   Hematological: Negative.     Psychiatric/Behavioral: Negative.   All other systems reviewed and are negative.      Objective:   Physical Exam  Motor strength is 5/5 bilateral deltoid by stress of grip, 5/5 left hip flexor, knee extensor, 4/5 ankle dorsiflexor 2 minus, right hip flexor 3, knee extensor, 3 minus. Ankle dorsiflexor. Hip abductors are 3 plus bilaterally. Hip adductor's are 5 bilaterally  Motor strength absent in the right lower extremity with exception of lateral thigh and lateral leg. Normal light touch sensation in the left lower extremity.    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 I think she would be a good candidate for outpatient therapy, but does have transportation issues. She'll have about a couple more weeks of PT through home health. Her husband is still trying to work but may be old to take her to later afternoon sessions  Physical medicine rehabilitation follow-up in 8 weeks

## 2017-02-25 ENCOUNTER — Other Ambulatory Visit: Payer: Self-pay

## 2017-02-25 DIAGNOSIS — I10 Essential (primary) hypertension: Secondary | ICD-10-CM

## 2017-02-25 MED ORDER — LISINOPRIL 10 MG PO TABS: 10.0000 mg | ORAL_TABLET | Freq: Every day | ORAL | 1 refills | Status: DC

## 2017-02-25 NOTE — Telephone Encounter (Signed)
Pt request 90 day refill lisinopril to [facility]; pt was seen on 01/19/17 and advised to continue lisinopril. Refill done and pt voiced understanding. FYI to Gentry Fitz NP.

## 2017-03-21 ENCOUNTER — Ambulatory Visit: Payer: BLUE CROSS/BLUE SHIELD | Attending: Physical Medicine & Rehabilitation | Admitting: Physical Therapy

## 2017-03-21 DIAGNOSIS — R2689 Other abnormalities of gait and mobility: Secondary | ICD-10-CM

## 2017-03-21 DIAGNOSIS — R2681 Unsteadiness on feet: Secondary | ICD-10-CM

## 2017-03-21 DIAGNOSIS — M6281 Muscle weakness (generalized): Secondary | ICD-10-CM

## 2017-03-21 NOTE — Therapy (Signed)
[facility] [address] [place], [place], [address] Phone: [phone]   Fax:  [phone]  Physical Therapy Evaluation  Patient Details  Name: Megan [name] MRN: [id number] Date of Birth: 1951/03/29 Referring Provider: Letta Pate  Encounter Date: 03/21/2017      PT End of Session - 03/21/17 1513    Visit Number 1   Number of Visits 18   Date for PT Re-Evaluation 05/20/17   Authorization Type BCBS-30 visit limit, 19 visits remaining   Authorization - Visit Number 1   Authorization - Number of Visits 19   PT Start Time 5176   PT Stop Time 0934   PT Time Calculation (min) 47 min   Equipment Utilized During Treatment Gait belt   Activity Tolerance Patient tolerated treatment well   Behavior During Therapy [facility] for tasks assessed/performed      Past Medical History:  Diagnosis Date  . Arthritis   . Borderline diabetic   . GERD (gastroesophageal reflux disease)   . History of bronchitis 2005  . History of kidney stones   . History of shingles   . Hyperlipidemia    takes Fish Oil daily  . Neuromuscular disorder (HCC)    tingling toes  . 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[place]) 12/15/2016  . Pneumonia 2009  . PONV (postoperative nausea and vomiting)   . Restless leg   . Type 2 diabetes mellitus ([place])   . Weakness    numbness and tingling in both feet r/t back    Past Surgical History:  Procedure Laterality Date  . ABDOMINAL HYSTERECTOMY  1979  . APPLICATION OF ROBOTIC ASSISTANCE FOR SPINAL PROCEDURE  12/19/2016   Procedure: APPLICATION OF ROBOTIC ASSISTANCE FOR SPINAL PROCEDURE;  Surgeon: Kevan Ny Ditty, MD;  Location: [facility];  Service: Neurosurgery;;  . blADder tacked   1991  . CERVICAL FUSION     2001  . HERNIA REPAIR  1991  . LUMBAR FUSION  2016   L4-5  . LUMBAR FUSION  2017   L2-3  . TONSILLECTOMY AND ADENOIDECTOMY  1959  . TUMOR EXCISION     WERTHIN'S TUMORS BOTH SIDES OF NECK    There were  no vitals filed for this visit.       Subjective Assessment - 03/21/17 0852    Subjective I'm doing so much better; before the surgery, I couldn't walk at all.  I cannot yet walk by myself, I have to use a walker.  L foot still tingles.  R leg is still weaker.  Have had two falls; one fall was R knee giving way.   Patient is accompained by: Family member  Husband, [name]   Pertinent History  left T10 costotransversectomy, T10-11 laminectomy for decompression, dorsal internal fixation and fusion with bilateral pedicle screws T9-T12 12/19/2016 per Dr. Cyndy Freeze, then inpatient rehab, HHPT was discharge about 2 weeks ago.  PMH arthritis, GERD, L4-5 lumbar fusion 2016, L2-3 lumbar fusion 2017, cervical fusion 2001   Patient Stated Goals Pt's goals for therapy are to get R leg stronger than what it is now; perhaps be able to walk with a cane   Currently in Pain? No/denies            [facility] PT Assessment - 03/21/17 0856      Assessment   Medical Diagnosis Paraparesis due to thoracic myelopathy surgery 12/19/16   Referring Provider Kirsteins   Onset Date/Surgical Date 12/19/16  progressive weakness in legs following second surgery 2017  Precautions   Precautions Fall;Back  Avoid bending, arching, twisting-pt unsure if still in place   Required Braces or Orthoses Spinal Brace;Other Brace/Splint  R AFO   Spinal Brace Thoracolumbosacral orthotic  per pt discharged 03/20/17     Balance Screen   Has the patient fallen in the past 6 months Yes   How many times? 2   Has the patient had a decrease in activity level because of a fear of falling?  No   Is the patient reluctant to leave their home because of a fear of falling?  No     Home Environment   Living Environment Private residence   Living Arrangements Spouse/significant other   Available Help at Discharge Family  Husband is working   Type of [place] Access Other (comment)  One step entry; also has ramp for scooter   [place] One level   [place] - 2 wheels;Walker - 4 wheels;Transport chair;Wheelchair - Press photographer     Prior Function   Level of Independence Needs assistance with ADLs;Independent with household mobility with device  Husband assists with ADLs due to back precautions   Vocation Retired   Leisure Enjoys walking and shopping, taking trips   Comments Pt wants to get back to driving     Observation/Other Assessments   Focus on Therapeutic Outcomes (FOTO)  Functional Intake Status score 38%; Neuro QOL 36.1%     Posture/Postural Control   Posture/Postural Control Postural limitations   Postural Limitations Flexed trunk     ROM / Strength   AROM / PROM / Strength Strength     Strength   Overall Strength Deficits   Strength Assessment Site Hip;Knee;Ankle   Right/Left Hip Right;Left   Right Hip Flexion 3/5   Right Hip ABduction 3-/5   Left Hip Flexion 3+/5   Right/Left Knee Right;Left   Right Knee Flexion 3-/5   Right Knee Extension 3-/5   Left Knee Flexion 4/5   Left Knee Extension 4/5   Right/Left Ankle Right;Left   Right Ankle Dorsiflexion 3-/5   Left Ankle Dorsiflexion 4/5     Transfers   Transfers Sit to Stand;Stand to Sit   Sit to Stand 5: Supervision;With upper extremity assist;With armrests;From chair/3-in-1   Sit to Stand Details (indicate cue type and reason) tends to have increased weightshift/weightbearing to L side, with 2 episodes of pushing chair off balance to the L.  Cues provided for equal weightbearing and push equally through bilateral upper extremities.   Stand to Sit 5: Supervision;With upper extremity assist;With armrests;To chair/3-in-1     Ambulation/Gait   Ambulation/Gait Yes   Ambulation/Gait Assistance 5: Supervision   Ambulation Distance (Feet) 70 Feet   Assistive device Rollator  R AFO   Gait Pattern Step-through pattern;Decreased step length - right;Decreased stance time - right;Right genu recurvatum;Trunk flexed    Ambulation Surface Level;Indoor   Gait velocity 21.72 sec = 1.51 ft/sec     Standardized Balance Assessment   Standardized Balance Assessment Timed Up and Go Test;Berg Balance Test     Berg Balance Test   Sit to Stand Able to stand  independently using hands   Standing Unsupported Able to stand 2 minutes with supervision   Sitting with Back Unsupported but Feet Supported on Floor or Stool Able to sit safely and securely 2 minutes   Stand to Sit Controls descent by using hands   Transfers Able to transfer with verbal cueing and /or supervision   Standing  Unsupported with Eyes Closed Able to stand 3 seconds   Standing Ubsupported with Feet Together Needs help to attain position but able to stand for 30 seconds with feet together   From Standing, Reach Forward with Outstretched Arm Can reach forward >5 cm safely (2")   From Standing Position, Pick up Object from Floor Unable to try/needs assist to keep balance  did not perform due to back precautions   From Standing Position, Turn to Look Behind Over each Shoulder Needs assist to keep from losing balance and falling  did not perform due to back precautions   Turn 360 Degrees Needs assistance while turning   Standing Unsupported, Alternately Place Feet on Step/Stool Needs assistance to keep from falling or unable to try   Standing Unsupported, One Foot in Front Needs help to step but can hold 15 seconds   Standing on One Leg Unable to try or needs assist to prevent fall   Total Score 21   Berg comment: Scores <45/56 indicate increased fall risk.       Timed Up and Go Test   Normal TUG (seconds) 23.98   TUG Comments Scores >13.5 seconds indicate increased fall risk.            Objective measurements completed on examination: See above findings.                    PT Short Term Goals - 03/21/17 1524      PT SHORT TERM GOAL #1   Title Pt will be independent with HEP for improved strength, balance, and gait.  TARGET  04/22/17   Time 5   Period Weeks   Status New     PT SHORT TERM GOAL #2   Title Pt will improve Berg score to at least 28/56 for decreased fall risk.   Time 5   Period Weeks   Status New     PT SHORT TERM GOAL #3   Title Pt will improve TUG score to less than or equal to 19 seconds for decreased fall risk.   Time 5   Period Weeks   Status New     PT SHORT TERM GOAL #4   Title Pt will perform at least 8 of 10 reps of sit<>stand transfers with minimal UE support, modified independently and no LOB, for improved safety and efficiency of transfers.   Time 5   Period Weeks   Status New     PT SHORT TERM GOAL #5   Title Pt will ambulate at least 300 ft using rollator walker, supervision , for improved gait efficiency and safety.   Time 5   Period Weeks   Status New           PT Long Term Goals - 03/21/17 1533      PT LONG TERM GOAL #1   Title Pt will verbalize understanding of fall prevention in the home environment.  TARGET 05/20/17   Time 9   Period Weeks   Status New     PT LONG TERM GOAL #2   Title Pt will improve Berg Balance score to at least 35/56 for decreased fall risk.   Time 9   Period Weeks   Status New     PT LONG TERM GOAL #3   Title Pt will improve TUG score to less than or equal to 15 seconds for decreased fall risk.   Time 5   Period Weeks   Status New  PT LONG TERM GOAL #4   Title Pt will improve gait velocity to at least 2 ft/sec for decreased fall risk.   Time 9   Period Weeks   Status New     PT LONG TERM GOAL #5   Title Pt will ambulate at least 50-75 ft using cane, with supervision, for improved independence/gait safety in home.   Time 9   Period Weeks   Status New                Plan - 03/21/17 1515    Clinical Impression Statement Pt is a 66 year old female who presents to OP PT following  left T10 costotransversectomy, T10-11 laminectomy for decompression, dorsal internal fixation and fusion with bilateral pedicle screws  T9-T12 12/19/2016 per Dr. Cyndy Freeze, to correct spondylogenic compression of thoracic spinal cord.  Pt has history of lumbar fusion surgeries in 2016 and 2017, with progressive numbness and weakness of lower extremities following 2017 surgery.  Prior to surgery, pt was unable to ambulate due to weakness and was using scooter in her home.  Since surgery on 12/19/16, she has had inpatient rehab as well as home health PT.  She presents with decreased strength, decreased sensation, decreased balance, decreased independence with transfers and gait.  Pt is at fall risk per Berg, TUG and gait velocity scores.  Pt is limited per FOTO report on walking around a room, walking one block and going on trips and vacation.  Pt will benefit from skilled PT to address the above stated deficits to improve functional mobility, decrease fall risk and improve participation in walking and leisure activities.   History and Personal Factors relevant to plan of care: PMH significant for lumbar fusion L4-5 2016, L2-3 2017, cervical fusion 2001   Clinical Presentation Evolving   Clinical Presentation due to: evolving functional status (not ambulatory 2-3 weeks prior to surgery due to progressive lower extremity weakness>now ambulatory with supervision with rollator), continued RLE weakness, at fall risk per 3 objective measures   Clinical Decision Making Moderate   Rehab Potential Good   Clinical Impairments Affecting Rehab Potential severity of deficits; pt is motivated for therapy    PT Frequency 2x / week   PT Duration 8 weeks  plus evaluation; 1x/wk for 1 week (POC= 9 weeks)   PT Treatment/Interventions ADLs/Self Care Home Management;Gait training;DME Instruction;Functional mobility training;Therapeutic activities;Therapeutic exercise;Balance training;Neuromuscular re-education;Patient/family education;Orthotic Fit/Training   PT Next Visit Plan Pt to bring in HEP packet from [facility] to review and possibly progress current HEP for  strength, balance; RLE strengthening, gait training activities   Consulted and Agree with Plan of Care Patient;Family member/caregiver   Family Member Consulted Husband      Patient will benefit from skilled therapeutic intervention in order to improve the following deficits and impairments:  Abnormal gait, Decreased balance, Decreased mobility, Decreased strength, Difficulty walking, Impaired sensation, Postural dysfunction  Visit Diagnosis: Other abnormalities of gait and mobility  Muscle weakness (generalized)  Unsteadiness on feet     Problem List Patient Active Problem List   Diagnosis Date Noted  . Sensation of pressure in bladder area 01/31/2017  . Paraplegia, incomplete ([place]) 01/07/2017  . Neurogenic bladder 12/27/2016  . 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/27/2016  . Myelopathy ([place]) 12/21/2016  . Surgery, elective   . Diabetes mellitus type 2 in nonobese (HCC)   . History of lumbar fusion   . History of fusion of cervical spine   . Neuropathic pain   . Post-operative pain   .  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 Paraparesis ([place]) 12/15/2016  . Pedal edema 12/14/2016  . Type 2 diabetes mellitus without complication, without long-term current use of insulin ([place]) 11/02/2016  . Hyperlipidemia 11/02/2016  . Essential hypertension 11/02/2016  . 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    Keithen Capo W. 03/21/2017, 3:38 PM Frazier Butt., PT  [facility] [address] [place], [place], [address] Phone: [phone]   Fax:  [phone]  Name: Megan [name] MRN: [id number] Date of Birth: [birth year]

## 2017-03-22 ENCOUNTER — Ambulatory Visit: Payer: BLUE CROSS/BLUE SHIELD | Admitting: Physical Therapy

## 2017-03-22 DIAGNOSIS — R2689 Other abnormalities of gait and mobility: Secondary | ICD-10-CM | POA: Diagnosis not present

## 2017-03-22 DIAGNOSIS — M6281 Muscle weakness (generalized): Secondary | ICD-10-CM

## 2017-03-22 NOTE — Therapy (Signed)
[facility] [address] [place], [place], [address] Phone: [phone]   Fax:  [phone]  Physical Therapy Treatment  Patient Details  Name: Megan [name] MRN: [id number] Date of Birth: 02/02/1951 Referring Provider: Letta Pate  Encounter Date: 03/22/2017      PT End of Session - 03/22/17 1636    Visit Number 2   Number of Visits 18   Date for PT Re-Evaluation 05/20/17   Authorization Type BC[employer]-30 visit limit, 19 visits remaining   Authorization - Visit Number 2   Authorization - Number of Visits 19   PT Start Time 6378   PT Stop Time 1615   PT Time Calculation (min) 44 min   Equipment Utilized During Treatment Gait belt   Activity Tolerance Patient tolerated treatment well   Behavior During Therapy WFL for tasks assessed/performed      Past Medical History:  Diagnosis Date  . Arthritis   . Borderline diabetic   . GERD (gastroesophageal reflux disease)   . History of bronchitis 2005  . History of kidney stones   . History of shingles   . Hyperlipidemia    takes Fish Oil daily  . Neuromuscular disorder (HCC)    tingling toes  . 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[place]) 12/15/2016  . Pneumonia 2009  . PONV (postoperative nausea and vomiting)   . Restless leg   . Type 2 diabetes mellitus ([place])   . Weakness    numbness and tingling in both feet r/t back    Past Surgical History:  Procedure Laterality Date  . ABDOMINAL HYSTERECTOMY  1979  . APPLICATION OF ROBOTIC ASSISTANCE FOR SPINAL PROCEDURE  12/19/2016   Procedure: APPLICATION OF ROBOTIC ASSISTANCE FOR SPINAL PROCEDURE;  Surgeon: Kevan Ny Ditty, MD;  Location: [facility];  Service: Neurosurgery;;  . blADder tacked   1991  . CERVICAL FUSION     2001  . HERNIA REPAIR  1991  . LUMBAR FUSION  2016   L4-5  . LUMBAR FUSION  2017   L2-3  . TONSILLECTOMY AND ADENOIDECTOMY  1959  . TUMOR EXCISION     WERTHIN'S TUMORS BOTH SIDES OF NECK    There were  no vitals filed for this visit.      Subjective Assessment - 03/22/17 1532    Subjective Went without TLSO brace yesterday evening for about 3 hours, and then this morning for a little less time.   A little sore, but okay now.     Patient is accompained by: Family member  Husband, [name]   Pertinent History  left T10 costotransversectomy, T10-11 laminectomy for decompression, dorsal internal fixation and fusion with bilateral pedicle screws T9-T12 12/19/2016 per Dr. Cyndy Freeze, then inpatient rehab, HHPT was discharge about 2 weeks ago.  PMH arthritis, GERD, L4-5 lumbar fusion 2016, L2-3 lumbar fusion 2017, cervical fusion 2001   Patient Stated Goals Pt's goals for therapy are to get R leg stronger than what it is now; perhaps be able to walk with a cane   Currently in Pain? No/denies                         [facility] Adult PT Treatment/Exercise - 03/22/17 1627      Transfers   Transfers Sit to Stand;Stand to Sit   Sit to Stand 5: Supervision;With upper extremity assist;From bed   Sit to Stand Details Verbal cues for technique  foot placement, equal weightshifting   Sit to Stand Details (  indicate cue type and reason) Cues to stand without R knee recurvatum.  Initial manual cues then verbal cues only   Stand to Sit 5: Supervision;With upper extremity assist;To bed   Number of Reps 2 sets;Other reps (comment)  5 reps per set   Comments Transfer training for functional lower extremity strengthening  and equal weightbearing through bilateral lower extremities     Ambulation/Gait   Ambulation/Gait Yes   Ambulation/Gait Assistance 5: Supervision   Ambulation/Gait Assistance Details Cues provided for initial R hip/knee flexion for improved foot clearance with stance phase of gait.   Ambulation Distance (Feet) 120 Feet  then 60 ft   Assistive device Rollator  R AFO   Gait Pattern Step-through pattern;Decreased step length - right;Decreased stance time - right;Right genu  recurvatum;Trunk flexed;Poor foot clearance - right;Decreased hip/knee flexion - right   Ambulation Surface Level;Indoor      Reviewed pt's HEP from HHPT: -Supine:  Straight leg raise RLE x 5 reps A/ROM, then 5 reps A/ROM with resistance in eccentric phase, SAQ x 8 reps, with resistance provided eccentric phase, heelslides x 8 reps, with resistance provided into knee extension.  (Cues provided for home-to perform in slow, controlled motions, with husband giving gentle resistance if needed for smooth, controlled movement pattern)  -Seated:  LAQ RLE x 5 reps, with cues for upright posture (may lessen full knee extension with LAQ)  -Attempted seated marching, with pt having difficulty raising RLE from seated position  -Standing at sink: Heel/toe raises x 5 reps, with cues for controlled movement, hip abduction bilateral x 5 reps (cues provided for upright posture and R foot position), hip extension x 5 reps (cues provided for upright posture), R hip/knee flexion x 5 reps, bilateral hamstring curls x 5 reps, then minisquats x 5 reps (cues provided for equal weight bearing)   Additional exercises worked on this visit- -in supine-hooklying marching, alternating legs x 10 reps (with therapist assist for RLE lift and placement) -in standing- RLE as stance phase with L forward step and weightshift x 8 reps, with bilateral UE support   With supine exercises, pt is not wearing TLSO -With standing exercises-pt is wearing TLSO and R AFO (As per patient report on eval, TLSO was discharged 03/20/17)-PT discussed at eval, gradually weaning off brace, not totally taking off brace fully.            PT Education - 03/22/17 1635    Education provided Yes   Education Details Reviewed pt's current HEP (from [facility]) and provided cues for technique.  Added sit<>stand exercise and appropriate sit<>stand transfer technique   Person(s) Educated Patient;Spouse   Methods Explanation;Demonstration;Handout;Verbal  cues   Comprehension Verbalized understanding;Returned demonstration;Verbal cues required          PT Short Term Goals - 03/21/17 1524      PT SHORT TERM GOAL #1   Title Pt will be independent with HEP for improved strength, balance, and gait.  TARGET 04/22/17   Time 5   Period Weeks   Status New     PT SHORT TERM GOAL #2   Title Pt will improve Berg score to at least 28/56 for decreased fall risk.   Time 5   Period Weeks   Status New     PT SHORT TERM GOAL #3   Title Pt will improve TUG score to less than or equal to 19 seconds for decreased fall risk.   Time 5   Period Weeks   Status New  PT SHORT TERM GOAL #4   Title Pt will perform at least 8 of 10 reps of sit<>stand transfers with minimal UE support, modified independently and no LOB, for improved safety and efficiency of transfers.   Time 5   Period Weeks   Status New     PT SHORT TERM GOAL #5   Title Pt will ambulate at least 300 ft using rollator walker, supervision , for improved gait efficiency and safety.   Time 5   Period Weeks   Status New           PT Long Term Goals - 03/21/17 1533      PT LONG TERM GOAL #1   Title Pt will verbalize understanding of fall prevention in the home environment.  TARGET 05/20/17   Time 9   Period Weeks   Status New     PT LONG TERM GOAL #2   Title Pt will improve Berg Balance score to at least 35/56 for decreased fall risk.   Time 9   Period Weeks   Status New     PT LONG TERM GOAL #3   Title Pt will improve TUG score to less than or equal to 15 seconds for decreased fall risk.   Time 5   Period Weeks   Status New     PT LONG TERM GOAL #4   Title Pt will improve gait velocity to at least 2 ft/sec for decreased fall risk.   Time 9   Period Weeks   Status New     PT LONG TERM GOAL #5   Title Pt will ambulate at least 50-75 ft using cane, with supervision, for improved independence/gait safety in home.   Time 9   Period Weeks   Status New                Plan - 03/22/17 1637    Clinical Impression Statement Treatment initiated this visit, with session focusing on review of current HEP.  Current HEP from HHPT includes supine, seated and standing leg strengthening exercises, and PT provided cues this visit for optimal posture, optimal technique and control with these exercises.  In addition, gait training to work on improved RLE clearance with initiation of stance phase and sit<>stand transfer technique.  Pt very motivated for therapy and responsive to instruction for improved technique on exercises.  Pt will continue to benefit from further skilled PT to work towards [place].   Rehab Potential Good   Clinical Impairments Affecting Rehab Potential severity of deficits; pt is motivated for therapy    PT Frequency 2x / week   PT Duration 8 weeks  plus evaluation; 1x/wk for 1 week (POC= 9 weeks)   PT Treatment/Interventions ADLs/Self Care Home Management;Gait training;DME Instruction;Functional mobility training;Therapeutic activities;Therapeutic exercise;Balance training;Neuromuscular re-education;Patient/family education;Orthotic Fit/Training   PT Next Visit Plan gait training activities, review sit<>stand, standing activities for increased RLE weightbearing with decreased R knee recurvatum   Consulted and Agree with Plan of Care Patient;Family member/caregiver   Family Member Consulted Husband      Patient will benefit from skilled therapeutic intervention in order to improve the following deficits and impairments:  Abnormal gait, Decreased balance, Decreased mobility, Decreased strength, Difficulty walking, Impaired sensation, Postural dysfunction  Visit Diagnosis: Muscle weakness (generalized)  Other abnormalities of gait and mobility     Problem List Patient Active Problem List   Diagnosis Date Noted  . Sensation of pressure in bladder area 01/31/2017  . Paraplegia, incomplete ([facility]) 01/07/2017  .  Neurogenic bladder  12/27/2016  . 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/27/2016  . Myelopathy ([facility]) 12/21/2016  . Surgery, elective   . Diabetes mellitus type 2 in nonobese (HCC)   . History of lumbar fusion   . History of fusion of cervical spine   . Neuropathic pain   . Post-operative pain   . 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 Paraparesis ([place]) 12/15/2016  . Pedal edema 12/14/2016  . Type 2 diabetes mellitus without complication, without long-term current use of insulin ([place]) 11/02/2016  . Hyperlipidemia 11/02/2016  . Essential hypertension 11/02/2016  . 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    MARRIOTT,AMY W. 03/22/2017, 4:47 PM  Frazier Butt., PT   [facility] [address] [place], [place], [address] Phone: [phone]   Fax:  [phone]  Name: ALLANAH MCFARLAND MRN: [id number] Date of Birth: August 08, 1951

## 2017-03-22 NOTE — Patient Instructions (Signed)
Sit to Stand Transfers:  1. Scoot out to the edge of the chair 2. Place your feet flat on the floor, shoulder width apart.  Make sure your feet are tucked just under your knees. (and even-like a yardstick is in front of your feet) 3. Lean forward (nose over toes), equally through both legs, pushing up with both arms. 4. Once you are standing, make sure you are looking ahead and standing tall.  To sit down:  1. Back up until you feel the chair behind your legs. 2. Bend at your hips, reaching back for your chair, if needed, then slowly squat to sit down on your chair.

## 2017-03-29 ENCOUNTER — Other Ambulatory Visit: Payer: Self-pay

## 2017-03-29 ENCOUNTER — Ambulatory Visit: Payer: BLUE CROSS/BLUE SHIELD | Admitting: Physical Therapy

## 2017-03-29 DIAGNOSIS — M6281 Muscle weakness (generalized): Secondary | ICD-10-CM

## 2017-03-29 DIAGNOSIS — R2689 Other abnormalities of gait and mobility: Secondary | ICD-10-CM | POA: Diagnosis not present

## 2017-03-29 MED ORDER — METHOCARBAMOL 750 MG PO TABS: 750.0000 mg | ORAL_TABLET | Freq: Three times a day (TID) | ORAL | 2 refills | Status: DC

## 2017-03-30 NOTE — Therapy (Signed)
[facility] [address] [place], [place], [address] Phone: [phone]   Fax:  [phone]  Physical Therapy Treatment  Patient Details  Name: Megan [name] MRN: [id number] Date of Birth: 08-26-1951 Referring Provider: Letta Pate  Encounter Date: 03/29/2017      PT End of Session - 03/30/17 1935    Visit Number 3   Number of Visits 18   Date for PT Re-Evaluation 05/20/17   Authorization Type BCBS-30 visit limit, 19 visits remaining   Authorization - Visit Number 3   Authorization - Number of Visits 19   PT Start Time 1287   PT Stop Time 8676   PT Time Calculation (min) 47 min      Past Medical History:  Diagnosis Date  . Arthritis   . Borderline diabetic   . GERD (gastroesophageal reflux disease)   . History of bronchitis 2005  . History of kidney stones   . History of shingles   . Hyperlipidemia    takes Fish Oil daily  . Neuromuscular disorder (HCC)    tingling toes  . 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[place]) 12/15/2016  . Pneumonia 2009  . PONV (postoperative nausea and vomiting)   . Restless leg   . Type 2 diabetes mellitus ([place])   . Weakness    numbness and tingling in both feet r/t back    Past Surgical History:  Procedure Laterality Date  . ABDOMINAL HYSTERECTOMY  1979  . APPLICATION OF ROBOTIC ASSISTANCE FOR SPINAL PROCEDURE  12/19/2016   Procedure: APPLICATION OF ROBOTIC ASSISTANCE FOR SPINAL PROCEDURE;  Surgeon: Kevan Ny Ditty, MD;  Location: [facility];  Service: Neurosurgery;;  . blADder tacked   1991  . CERVICAL FUSION     2001  . HERNIA REPAIR  1991  . LUMBAR FUSION  2016   L4-5  . LUMBAR FUSION  2017   L2-3  . TONSILLECTOMY AND ADENOIDECTOMY  1959  . TUMOR EXCISION     WERTHIN'S TUMORS BOTH SIDES OF NECK    There were no vitals filed for this visit.      Subjective Assessment - 03/30/17 1926    Subjective Pt reports that as of last Sunday, 03-26-17 she does not have to wear  brace (TLSO):  states she is wearing it some (is wearing it to PT session) and trying to do without it some   Pertinent History  left T10 costotransversectomy, T10-11 laminectomy for decompression, dorsal internal fixation and fusion with bilateral pedicle screws T9-T12 12/19/2016 per Dr. Cyndy Freeze, then inpatient rehab, HHPT was discharge about 2 weeks ago.  PMH arthritis, GERD, L4-5 lumbar fusion 2016, L2-3 lumbar fusion 2017, cervical fusion 2001   Patient Stated Goals Pt's goals for therapy are to get R leg stronger than what it is now; perhaps be able to walk with a cane   Currently in Pain? No/denies                         [facility] Adult PT Treatment/Exercise - 03/30/17 0001      Transfers   Transfers Sit to Stand;Stand to Sit   Sit to Stand 5: Supervision;With upper extremity assist;From bed   Comments pt wearing TLSO      Exercises   Exercises Lumbar;Knee/Hip     Lumbar Exercises: Machines for Strengthening   Leg Press 40# bil. LE's 3 sets 10 reps with min assist for return for control     Lumbar Exercises: Standing  Heel Raises 10 reps     Lumbar Exercises: Supine   Clam 10 reps  with manula resistance   Bridge 10 reps     Knee/Hip Exercises: Seated   Marching AROM;Both;1 set;10 reps  with UE support   Hamstring Curl Strengthening;Right;1 set;10 reps  with yellow theraband     Knee/Hip Exercises: Supine   Quad Sets --   Straight Leg Raises Right;AROM;1 set;10 reps      LAQ's RLE 10 reps no resistance in seated position       Balance Exercises - 03/30/17 1933      Balance Exercises: Standing   Standing Eyes Opened Wide (BOA);2 reps;10 secs  no UE support   Step Ups Forward;4 inch;UE support 2  10 reps   Sidestepping 2 reps  insdie // bars     Pt performed standing Lt hip flexion, abduction and extension for RLE closed chain strengthening x 10 reps each with bil. UE  Support at counter  Tap ups to 4" step 10 reps each with UE support on //  bars  Pt performed step down exercise with LLE for Rt quad eccentric strengthening 10 reps on 4" step inside // bars with bil. UE support        PT Short Term Goals - 03/21/17 1524      PT SHORT TERM GOAL #1   Title Pt will be independent with HEP for improved strength, balance, and gait.  TARGET 04/22/17   Time 5   Period Weeks   Status New     PT SHORT TERM GOAL #2   Title Pt will improve Berg score to at least 28/56 for decreased fall risk.   Time 5   Period Weeks   Status New     PT SHORT TERM GOAL #3   Title Pt will improve TUG score to less than or equal to 19 seconds for decreased fall risk.   Time 5   Period Weeks   Status New     PT SHORT TERM GOAL #4   Title Pt will perform at least 8 of 10 reps of sit<>stand transfers with minimal UE support, modified independently and no LOB, for improved safety and efficiency of transfers.   Time 5   Period Weeks   Status New     PT SHORT TERM GOAL #5   Title Pt will ambulate at least 300 ft using rollator walker, supervision , for improved gait efficiency and safety.   Time 5   Period Weeks   Status New           PT Long Term Goals - 03/21/17 1533      PT LONG TERM GOAL #1   Title Pt will verbalize understanding of fall prevention in the home environment.  TARGET 05/20/17   Time 9   Period Weeks   Status New     PT LONG TERM GOAL #2   Title Pt will improve Berg Balance score to at least 35/56 for decreased fall risk.   Time 9   Period Weeks   Status New     PT LONG TERM GOAL #3   Title Pt will improve TUG score to less than or equal to 15 seconds for decreased fall risk.   Time 5   Period Weeks   Status New     PT LONG TERM GOAL #4   Title Pt will improve gait velocity to at least 2 ft/sec for decreased fall risk.   Time 9  Period Weeks   Status New     PT LONG TERM GOAL #5   Title Pt will ambulate at least 50-75 ft using cane, with supervision, for improved independence/gait safety in home.   Time  9   Period Weeks   Status New               Plan - 03/30/17 1935    Clinical Impression Statement Pt has Rt knee hyperextension in stance due to weak hamstrings and weak plantarflexors.  Balance is limited by RLE weakness with pt reporting that Rt knee has buckled with fatigue. Pt is hesistant to attempt increased weight bearing on RLE iwithout UE support.  Pt tolerated mat exercises well without TLSO donned.   Rehab Potential Good   Clinical Impairments Affecting Rehab Potential severity of deficits; pt is motivated for therapy    PT Frequency 2x / week   PT Duration 8 weeks   PT Treatment/Interventions ADLs/Self Care Home Management;Gait training;DME Instruction;Functional mobility training;Therapeutic activities;Therapeutic exercise;Balance training;Neuromuscular re-education;Patient/family education;Orthotic Fit/Training   PT Next Visit Plan gait training activities, standing activities for increased RLE weightbearing with decreased R knee recurvatum      Patient will benefit from skilled therapeutic intervention in order to improve the following deficits and impairments:  Abnormal gait, Decreased balance, Decreased mobility, Decreased strength, Difficulty walking, Impaired sensation, Postural dysfunction  Visit Diagnosis: Muscle weakness (generalized)  Other abnormalities of gait and mobility     Problem List Patient Active Problem List   Diagnosis Date Noted  . Sensation of pressure in bladder area 01/31/2017  . Paraplegia, incomplete ([place]) 01/07/2017  . Neurogenic bladder 12/27/2016  . 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/27/2016  . Myelopathy ([place]) 12/21/2016  . Surgery, elective   . Diabetes mellitus type 2 in nonobese (HCC)   . History of lumbar fusion   . History of fusion of cervical spine   . Neuropathic pain   . Post-operative pain   . 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 Paraparesis ([place]) 12/15/2016  . Pedal edema 12/14/2016  . Type 2  diabetes mellitus without complication, without long-term current use of insulin ([place]) 11/02/2016  . Hyperlipidemia 11/02/2016  . Essential hypertension 11/02/2016  . 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    Alda Lea, PT 03/30/2017, 7:43 PM  [facility] [address] [place], [place], [address] Phone: [phone]   Fax:  [phone]  Name: Megan [name] MRN: [id number] Date of Birth: 07/23/1951

## 2017-03-31 ENCOUNTER — Ambulatory Visit: Payer: BLUE CROSS/BLUE SHIELD | Admitting: Physical Therapy

## 2017-03-31 ENCOUNTER — Encounter: Payer: Self-pay | Admitting: Physical Therapy

## 2017-03-31 DIAGNOSIS — M6281 Muscle weakness (generalized): Secondary | ICD-10-CM

## 2017-03-31 DIAGNOSIS — R2681 Unsteadiness on feet: Secondary | ICD-10-CM

## 2017-03-31 DIAGNOSIS — R2689 Other abnormalities of gait and mobility: Secondary | ICD-10-CM | POA: Diagnosis not present

## 2017-04-01 ENCOUNTER — Telehealth: Payer: Self-pay | Admitting: *Deleted

## 2017-04-01 MED ORDER — METHOCARBAMOL 750 MG PO TABS: 750.0000 mg | ORAL_TABLET | Freq: Three times a day (TID) | ORAL | 2 refills | Status: DC

## 2017-04-01 NOTE — Telephone Encounter (Signed)
Mrs Childers called for refill on methocarbamol.  It was refilled on 03/29/17 but was on print so it did not go to pharmacy.  Reordered electronically and notified Ms Balint.

## 2017-04-01 NOTE — Therapy (Signed)
[facility] [address] [place], [place], [address] Phone: [phone]   Fax:  [phone]  Physical Therapy Treatment  Patient Details  Name: Megan [name] MRN: [id number] Date of Birth: 15-Aug-1951 Referring Provider: Letta Pate  Encounter Date: 03/31/2017      PT End of Session - 03/31/17 1453    Visit Number 4   Number of Visits 18   Date for PT Re-Evaluation 05/20/17   Authorization Type BCBS-30 visit limit, 19 visits remaining   Authorization - Visit Number 4   Authorization - Number of Visits 19   PT Start Time 7026   PT Stop Time 1528   PT Time Calculation (min) 39 min   Activity Tolerance Patient tolerated treatment well      Past Medical History:  Diagnosis Date  . Arthritis   . Borderline diabetic   . GERD (gastroesophageal reflux disease)   . History of bronchitis 2005  . History of kidney stones   . History of shingles   . Hyperlipidemia    takes Fish Oil daily  . Neuromuscular disorder (HCC)    tingling toes  . 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[place]) 12/15/2016  . Pneumonia 2009  . PONV (postoperative nausea and vomiting)   . Restless leg   . Type 2 diabetes mellitus ([place])   . Weakness    numbness and tingling in both feet r/t back    Past Surgical History:  Procedure Laterality Date  . ABDOMINAL HYSTERECTOMY  1979  . APPLICATION OF ROBOTIC ASSISTANCE FOR SPINAL PROCEDURE  12/19/2016   Procedure: APPLICATION OF ROBOTIC ASSISTANCE FOR SPINAL PROCEDURE;  Surgeon: Kevan Ny Ditty, MD;  Location: [facility];  Service: Neurosurgery;;  . blADder tacked   1991  . CERVICAL FUSION     2001  . HERNIA REPAIR  1991  . LUMBAR FUSION  2016   L4-5  . LUMBAR FUSION  2017   L2-3  . TONSILLECTOMY AND ADENOIDECTOMY  1959  . TUMOR EXCISION     WERTHIN'S TUMORS BOTH SIDES OF NECK    There were no vitals filed for this visit.      Subjective Assessment - 03/31/17 1452    Subjective No new  complaints. No falls or pain to report. Continues to wean out of brace (TLSO)- reports about 50/50 with wearing it. Was sore after last session, none today.    Pertinent History  left T10 costotransversectomy, T10-11 laminectomy for decompression, dorsal internal fixation and fusion with bilateral pedicle screws T9-T12 12/19/2016 per Dr. Cyndy Freeze, then inpatient rehab, HHPT was discharge about 2 weeks ago.  PMH arthritis, GERD, L4-5 lumbar fusion 2016, L2-3 lumbar fusion 2017, cervical fusion 2001   Currently in Pain? No/denies             [facility] Adult PT Treatment/Exercise - 03/31/17 1455      Lumbar Exercises: Stretches   Single Knee to Chest Stretch 2 reps;30 seconds;Limitations   Single Knee to Chest Stretch Limitations with towel assistance, cues on hold time   Double Knee to Chest Stretch 2 reps;30 seconds;Limitations   Double Knee to Chest Stretch Limitations cues on hold position to decrease pressure on knees and for hold times   Lower Trunk Rotation 2 reps;30 seconds   Lower Trunk Rotation Limitations cues on technique and form     Lumbar Exercises: Supine   Ab Set 10 reps;5 seconds;Limitations   AB Set Limitations cues for pelvic tilt back and abdominal bracing   Bridge  Non-compliant;10 reps;5 seconds;Limitations   Bridge Limitations cues for abdominal bracing and to keep pelvis level with lift   Straight Leg Raise 10 reps;3 seconds;Limitations   Straight Leg Raises Limitations cues on form, assist to controll descent with lowering leg     Knee/Hip Exercises: Standing   Terminal Knee Extension Limitations with red band: 5 sec holds with cues on form/technique. manual stability provided at pelvis to decrease compensatory motions     Knee/Hip Exercises: Seated   Long Arc Quad AROM;Strengthening;Both;1 set;10 reps;Limitations   Long Arc Quad Weight --  1# right leg, 3# left leg   Long Arc Quad Limitations cues to slow down with exercises, UE support on mat.   Marching  AROM;Strengthening;Both;1 set;10 reps;Limitations   Marching Limitations alternating legs with cues on posture and ex form. UE support on mat table.    Marching Weights --  1# weight to right LE, 3# weight to left LE   Hamstring Curl AROM;Strengthening;Right;1 set;10 reps;Limitations   Hamstring Limitations cues for slow, controlled movements    Hamstring Weights --  using red band for resistance     Knee/Hip Exercises: Supine   Bridges Limitations bridge with clamshell: with red band, cues on form and hold times   Bridges with Clamshell AROM;Strengthening;Both;1 set;10 reps;Limitations            PT Short Term Goals - 03/21/17 1524      PT SHORT TERM GOAL #1   Title Pt will be independent with HEP for improved strength, balance, and gait.  TARGET 04/22/17   Time 5   Period Weeks   Status New     PT SHORT TERM GOAL #2   Title Pt will improve Berg score to at least 28/56 for decreased fall risk.   Time 5   Period Weeks   Status New     PT SHORT TERM GOAL #3   Title Pt will improve TUG score to less than or equal to 19 seconds for decreased fall risk.   Time 5   Period Weeks   Status New     PT SHORT TERM GOAL #4   Title Pt will perform at least 8 of 10 reps of sit<>stand transfers with minimal UE support, modified independently and no LOB, for improved safety and efficiency of transfers.   Time 5   Period Weeks   Status New     PT SHORT TERM GOAL #5   Title Pt will ambulate at least 300 ft using rollator walker, supervision , for improved gait efficiency and safety.   Time 5   Period Weeks   Status New           PT Long Term Goals - 03/21/17 1533      PT LONG TERM GOAL #1   Title Pt will verbalize understanding of fall prevention in the home environment.  TARGET 05/20/17   Time 9   Period Weeks   Status New     PT LONG TERM GOAL #2   Title Pt will improve Berg Balance score to at least 35/56 for decreased fall risk.   Time 9   Period Weeks   Status New      PT LONG TERM GOAL #3   Title Pt will improve TUG score to less than or equal to 15 seconds for decreased fall risk.   Time 5   Period Weeks   Status New     PT LONG TERM GOAL #4   Title Pt will  improve gait velocity to at least 2 ft/sec for decreased fall risk.   Time 9   Period Weeks   Status New     PT LONG TERM GOAL #5   Title Pt will ambulate at least 50-75 ft using cane, with supervision, for improved independence/gait safety in home.   Time 9   Period Weeks   Status New             Plan - 03/31/17 1454    Clinical Impression Statement Today's skilled session continued to focus on core and LE strengthening without any issues reported. Pt is making steady progress toward goals and should benefit from continued PT to progress toward unmet goals.    Rehab Potential Good   Clinical Impairments Affecting Rehab Potential severity of deficits; pt is motivated for therapy    PT Frequency 2x / week   PT Duration 8 weeks   PT Treatment/Interventions ADLs/Self Care Home Management;Gait training;DME Instruction;Functional mobility training;Therapeutic activities;Therapeutic exercise;Balance training;Neuromuscular re-education;Patient/family education;Orthotic Fit/Training   PT Next Visit Plan gait training activities, standing activities for increased RLE weightbearing with decreased R knee recurvatum      Patient will benefit from skilled therapeutic intervention in order to improve the following deficits and impairments:  Abnormal gait, Decreased balance, Decreased strength, Difficulty walking, Impaired sensation, Postural dysfunction  Visit Diagnosis: Muscle weakness (generalized)  Unsteadiness on feet     Problem List Patient Active Problem List   Diagnosis Date Noted  . Sensation of pressure in bladder area 01/31/2017  . Paraplegia, incomplete ([place]) 01/07/2017  . Neurogenic bladder 12/27/2016  . 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/27/2016  . Myelopathy ([facility]) 12/21/2016  .  Surgery, elective   . Diabetes mellitus type 2 in nonobese (HCC)   . History of lumbar fusion   . History of fusion of cervical spine   . Neuropathic pain   . Post-operative pain   . 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 Paraparesis ([place]) 12/15/2016  . Pedal edema 12/14/2016  . Type 2 diabetes mellitus without complication, without long-term current use of insulin ([place]) 11/02/2016  . Hyperlipidemia 11/02/2016  . Essential hypertension 11/02/2016  . 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    [name], PTA, [facility] [address] [place], [place] [address] [phone] 04/01/17, 9:29 AM   Name: Megan [name] MRN: [id number] Date of Birth: 12-19-1950

## 2017-04-05 ENCOUNTER — Ambulatory Visit: Payer: BLUE CROSS/BLUE SHIELD | Admitting: Physical Therapy

## 2017-04-05 DIAGNOSIS — R2681 Unsteadiness on feet: Secondary | ICD-10-CM

## 2017-04-05 DIAGNOSIS — R2689 Other abnormalities of gait and mobility: Secondary | ICD-10-CM | POA: Diagnosis not present

## 2017-04-05 DIAGNOSIS — M6281 Muscle weakness (generalized): Secondary | ICD-10-CM

## 2017-04-05 NOTE — Patient Instructions (Addendum)
Seated Ball Squeeze    Place air-filled ball or pillow between knees and squeeze your knees together.  Hold 3 seconds, then relax, not letting the pillow fall. Do _10___ repetitions, __2-3__ sets.  http://bt.exer.us/111   Copyright  VHI. All rights reserved.  FLEXION: Sitting (Active)    Scoot out to the edge of your chair.  Sit with right leg extended. Bend right knee and pull heel backward. Try to keep heel on the ground.  Hold 15-30 seconds Complete _3___ repetitions. Perform _2-3__ sessions per day.  (Try to do this one out of your brace)  Copyright  VHI. All rights reserved.

## 2017-04-06 NOTE — Therapy (Signed)
[facility] [address] [place], [place], [address] Phone: [phone]   Fax:  [phone]  Physical Therapy Treatment  Patient Details  Name: Megan [name] MRN: [id number] Date of Birth: 12-27-1950 Referring Provider: Letta Pate  Encounter Date: 04/05/2017      PT End of Session - 04/06/17 1650    Visit Number 5   Number of Visits 18   Date for PT Re-Evaluation 05/20/17   Authorization Type BCBS-30 visit limit, 19 visits remaining   Authorization - Visit Number 5   Authorization - Number of Visits 19   PT Start Time 1532   PT Stop Time 1618   PT Time Calculation (min) 46 min   Equipment Utilized During Treatment Gait belt   Activity Tolerance Patient tolerated treatment well   Behavior During Therapy WFL for tasks assessed/performed      Past Medical History:  Diagnosis Date  . Arthritis   . Borderline diabetic   . GERD (gastroesophageal reflux disease)   . History of bronchitis 2005  . History of kidney stones   . History of shingles   . Hyperlipidemia    takes Fish Oil daily  . Neuromuscular disorder (HCC)    tingling toes  . 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[facility]) 12/15/2016  . Pneumonia 2009  . PONV (postoperative nausea and vomiting)   . Restless leg   . Type 2 diabetes mellitus ([facility])   . Weakness    numbness and tingling in both feet r/t back    Past Surgical History:  Procedure Laterality Date  . ABDOMINAL HYSTERECTOMY  1979  . APPLICATION OF ROBOTIC ASSISTANCE FOR SPINAL PROCEDURE  12/19/2016   Procedure: APPLICATION OF ROBOTIC ASSISTANCE FOR SPINAL PROCEDURE;  Surgeon: Kevan Ny Ditty, MD;  Location: [facility];  Service: Neurosurgery;;  . blADder tacked   1991  . CERVICAL FUSION     2001  . HERNIA REPAIR  1991  . LUMBAR FUSION  2016   L4-5  . LUMBAR FUSION  2017   L2-3  . TONSILLECTOMY AND ADENOIDECTOMY  1959  . TUMOR EXCISION     WERTHIN'S TUMORS BOTH SIDES OF NECK    There were  no vitals filed for this visit.      Subjective Assessment - 04/05/17 1533    Subjective (P)  Today's the first day I'll do therapy without wearing the brace.   Pertinent History (P)   left T10 costotransversectomy, T10-11 laminectomy for decompression, dorsal internal fixation and fusion with bilateral pedicle screws T9-T12 12/19/2016 per Dr. Cyndy Freeze, then inpatient rehab, HHPT was discharge about 2 weeks ago.  PMH arthritis, GERD, L4-5 lumbar fusion 2016, L2-3 lumbar fusion 2017, cervical fusion 2001   Patient Stated Goals (P)  Pt's goals for therapy are to get R leg stronger than what it is now; perhaps be able to walk with a cane   Currently in Pain? (P)  No/denies                         [facility] Adult PT Treatment/Exercise - 04/06/17 0001      Transfers   Transfers Sit to Stand;Stand to Sit   Sit to Stand 5: Supervision;With upper extremity assist;From bed   Sit to Stand Details (indicate cue type and reason) Cues for foot placement and equal weightbearing through RLE; at end of session pt tries pushing up from mat surface using RUE for improved technique   Stand to Sit 5:  Supervision;With upper extremity assist;To bed   Number of Reps Other reps (comment)  5     Ambulation/Gait   Ambulation/Gait Yes   Ambulation/Gait Assistance 5: Supervision   Ambulation/Gait Assistance Details Cues provided for decreased reliance on UEs on rollator; cues provided for RLE foot placement to avoid RLE external rotation   Ambulation Distance (Feet) 100 Feet  x 2, 120 ft, then 60 ft x 2   Assistive device Rollator  R AFO   Gait Pattern Step-through pattern;Decreased step length - right;Decreased stance time - right;Right genu recurvatum;Trunk flexed;Poor foot clearance - right;Decreased hip/knee flexion - right   Ambulation Surface Level;Indoor     Posture/Postural Control   Posture/Postural Control Postural limitations   Posture Comments Postural control exercises in sitting on mat  surface and on green balance disc:  abdominal activation with alternating UE lifts x 10 reps, then clasped hand bilateral UE lift x 10 reps     Exercises   Exercises Ankle;Knee/Hip     Knee/Hip Exercises: Stretches   Other Knee/Hip Stretches Seated knee flexion stretch (without wearing R AFO), heel on ground, x 5 reps, 30 second hold for stretch to R heelcord.      Knee/Hip Exercises: Seated   Ball Squeeze Hip adduction seated x 10 reps     Ankle Exercises: Seated   Heel Raises 10 reps   Heel Raises Limitations manual cues provided to prevent excess inversion/supination, R LE   Toe Raise --  Attempted on R   Toe Raise Limitations Pt limited to only lifting toes   Heel Slides --   Heel Slides Limitations --             Balance Exercises - 04/06/17 1637      Balance Exercises: Standing   SLS with Vectors Solid surface;Upper extremity assist 2;Other reps (comment)  8 reps step tap to 4" block-cues to avoid circumduction RLE   Standing, One Foot on a Step Limitations  Attempted step tap to6" step RLE, but pt circumducts   Marching Limitations Marching forward in parallel bars, 3 reps up and back   Other Standing Exercises Standing in parallel bars, progressing to no UE support, cues for upright posture; in standing:  lateral weightshifting (pt c/o pain in low back); stagger stance forward/back weightshifting x 5 reps each position, R knee begins to buckle with posterior weightshift.  Standing with alternating UE lifts x 10 reps (begins to c/o pain in low back).  Pt reports pain in low back subsides with sitting           PT Education - 04/06/17 1649    Education provided Yes   Education Details HEP additions-seated ball squeeze and seated heelcord stretch   Person(s) Educated Patient   Methods Explanation;Demonstration;Handout   Comprehension Verbalized understanding;Returned demonstration          PT Short Term Goals - 03/21/17 1524      PT SHORT TERM GOAL #1    Title Pt will be independent with HEP for improved strength, balance, and gait.  TARGET 04/22/17   Time 5   Period Weeks   Status New     PT SHORT TERM GOAL #2   Title Pt will improve Berg score to at least 28/56 for decreased fall risk.   Time 5   Period Weeks   Status New     PT SHORT TERM GOAL #3   Title Pt will improve TUG score to less than or equal to 19 seconds for  decreased fall risk.   Time 5   Period Weeks   Status New     PT SHORT TERM GOAL #4   Title Pt will perform at least 8 of 10 reps of sit<>stand transfers with minimal UE support, modified independently and no LOB, for improved safety and efficiency of transfers.   Time 5   Period Weeks   Status New     PT SHORT TERM GOAL #5   Title Pt will ambulate at least 300 ft using rollator walker, supervision , for improved gait efficiency and safety.   Time 5   Period Weeks   Status New           PT Long Term Goals - 03/21/17 1533      PT LONG TERM GOAL #1   Title Pt will verbalize understanding of fall prevention in the home environment.  TARGET 05/20/17   Time 9   Period Weeks   Status New     PT LONG TERM GOAL #2   Title Pt will improve Berg Balance score to at least 35/56 for decreased fall risk.   Time 9   Period Weeks   Status New     PT LONG TERM GOAL #3   Title Pt will improve TUG score to less than or equal to 15 seconds for decreased fall risk.   Time 5   Period Weeks   Status New     PT LONG TERM GOAL #4   Title Pt will improve gait velocity to at least 2 ft/sec for decreased fall risk.   Time 9   Period Weeks   Status New     PT LONG TERM GOAL #5   Title Pt will ambulate at least 50-75 ft using cane, with supervision, for improved independence/gait safety in home.   Time 9   Period Weeks   Status New               Plan - 04/06/17 1651    Clinical Impression Statement Today is the first day of PT that patient is not wearing TLSO.  Pt tolerates seated exercises well, but does  have c/o of pain in low back up to 8/10 with standing exercises working on lessening UE support.  Advised patient to perhaps begin PT session without brace, but bring TLSO in case additional lumbar support needed for standing exercises.  Pt will continue to benefit from further skilled PT to address stregnth, balance and gait.   Rehab Potential Good   Clinical Impairments Affecting Rehab Potential severity of deficits; pt is motivated for therapy    PT Frequency 2x / week   PT Duration 8 weeks   PT Treatment/Interventions ADLs/Self Care Home Management;Gait training;DME Instruction;Functional mobility training;Therapeutic activities;Therapeutic exercise;Balance training;Neuromuscular re-education;Patient/family education;Orthotic Fit/Training   PT Next Visit Plan continue to work on core stability/back strengthening exercises in sitting(?quadruped); RLE strengthening and standing balance exercises; with gait progression as able   Consulted and Agree with Plan of Care Patient;Family member/caregiver   Family Member Consulted Husband      Patient will benefit from skilled therapeutic intervention in order to improve the following deficits and impairments:  Abnormal gait, Decreased balance, Decreased strength, Difficulty walking, Impaired sensation, Postural dysfunction  Visit Diagnosis: Unsteadiness on feet  Muscle weakness (generalized)  Other abnormalities of gait and mobility     Problem List Patient Active Problem List   Diagnosis Date Noted  . Sensation of pressure in bladder area 01/31/2017  . Paraplegia, incomplete (  [place]) 01/07/2017  . Neurogenic bladder 12/27/2016  . 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/27/2016  . Myelopathy ([facility]) 12/21/2016  . Surgery, elective   . Diabetes mellitus type 2 in nonobese (HCC)   . History of lumbar fusion   . History of fusion of cervical spine   . Neuropathic pain   . Post-operative pain   . 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  Paraparesis ([facility]) 12/15/2016  . Pedal edema 12/14/2016  . Type 2 diabetes mellitus without complication, without long-term current use of insulin ([place]) 11/02/2016  . Hyperlipidemia 11/02/2016  . Essential hypertension 11/02/2016  . 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    Megan [name] W. 04/06/2017, 4:58 PM  Frazier Butt., PT   [facility] [address] [place], [place], [address] Phone: [phone]   Fax:  [phone]  Name: Megan [name] MRN: [id number] Date of Birth: 12-23-1950

## 2017-04-08 ENCOUNTER — Ambulatory Visit: Payer: BLUE CROSS/BLUE SHIELD | Admitting: Physical Therapy

## 2017-04-08 ENCOUNTER — Encounter: Payer: Self-pay | Admitting: Physical Therapy

## 2017-04-08 DIAGNOSIS — R2689 Other abnormalities of gait and mobility: Secondary | ICD-10-CM | POA: Diagnosis not present

## 2017-04-08 DIAGNOSIS — R2681 Unsteadiness on feet: Secondary | ICD-10-CM

## 2017-04-08 DIAGNOSIS — M6281 Muscle weakness (generalized): Secondary | ICD-10-CM

## 2017-04-10 NOTE — Therapy (Signed)
[facility] [address] [place], [place], [address] Phone: [phone]   Fax:  [phone]  Physical Therapy Treatment  Patient Details  Name: Megan [name] MRN: [id number] Date of Birth: 1951/08/15 Referring Provider: Letta Pate  Encounter Date: 04/08/2017    04/08/17 1539  PT Visits / Re-Eval  Visit Number 6  Number of Visits 18  Date for PT Re-Evaluation 05/20/17  Authorization  Authorization Type BCBS-30 visit limit, 19 visits remaining  Authorization - Visit Number 6  Authorization - Number of Visits 19  PT Time Calculation  PT Start Time 1535  PT Stop Time 0233  PT Time Calculation (min) 39 min  PT - End of Session  Equipment Utilized During Treatment Gait belt  Activity Tolerance Patient tolerated treatment well  Behavior During Therapy WFL for tasks assessed/performed     Past Medical History:  Diagnosis Date  . Arthritis   . Borderline diabetic   . GERD (gastroesophageal reflux disease)   . History of bronchitis 2005  . History of kidney stones   . History of shingles   . Hyperlipidemia    takes Fish Oil daily  . Neuromuscular disorder (HCC)    tingling toes  . 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[facility]) 12/15/2016  . Pneumonia 2009  . PONV (postoperative nausea and vomiting)   . Restless leg   . Type 2 diabetes mellitus ([facility])   . Weakness    numbness and tingling in both feet r/t back    Past Surgical History:  Procedure Laterality Date  . ABDOMINAL HYSTERECTOMY  1979  . APPLICATION OF ROBOTIC ASSISTANCE FOR SPINAL PROCEDURE  12/19/2016   Procedure: APPLICATION OF ROBOTIC ASSISTANCE FOR SPINAL PROCEDURE;  Surgeon: Kevan Ny Ditty, MD;  Location: [facility];  Service: Neurosurgery;;  . blADder tacked   1991  . CERVICAL FUSION     2001  . HERNIA REPAIR  1991  . LUMBAR FUSION  2016   L4-5  . LUMBAR FUSION  2017   L2-3  . TONSILLECTOMY AND ADENOIDECTOMY  1959  . TUMOR EXCISION     WERTHIN'S TUMORS BOTH SIDES OF NECK    There were no vitals filed for this visit.     04/08/17 1537  Symptoms/Limitations  Subjective No new complaints. No falls or pain to report. Has brace on for therapy today. Went home on Tuesday after PT and put brace back on. Wore it off and on over last 2 days due to pain she had with last PT session in standing.                         Patient is accompained by: Family member (husband,  [name])  Pertinent History left T10 costotransversectomy, T10-11 laminectomy for decompression, dorsal internal fixation and fusion with bilateral pedicle screws T9-T12 12/19/2016 per Dr. Cyndy Freeze, then inpatient rehab, HHPT was discharge about 2 weeks ago.  PMH arthritis, GERD, L4-5 lumbar fusion 2016, L2-3 lumbar fusion 2017, cervical fusion 2001  Patient Stated Goals Pt's goals for therapy are to get R leg stronger than what it is now; perhaps be able to walk with a cane  Pain Assessment  Currently in Pain? No/denies      04/08/17 1540  Exercises  Exercises Other Exercises  Other Exercises  in quadruped over red ball: ant/post rocking with emphasis on weight shifting and maintaining neutral spine prior to quadruped ex's listed below; seated on large pball: bouncing x 1 minute with  emphasis on tall posture, pelvic rocks laterally x 10 each side and fwd/bwd x 10 reps each way, pelvic circles x 10 each way, alternating UE raises x 10 each side, alternating marching x 10 reps each side and alternating LAQs x 10 each side. cues for posture and abdominal bracing with all ex's, min assist for balance on  ball.                                 Lumbar Exercises: Supine  Ab Set 5 reps;5 seconds;Limitations  AB Set Limitations cues for pelvic tilt back and abdominal bracing  Bridge Non-compliant;10 reps;5 seconds;Limitations  Bridge Limitations cues for abdominal bracing and to keep pelvis level with lift; progressed to legs over red pball: 5 sec hold x 10 reps wtih cues on  posture and level pelvis, progressing to legs over ball with arms across chest x 10 reps with 5 sec holds.     Other Supine Lumbar Exercises table top hold: 10 sec hold x 3, 7 sec hold x 3 due to fatigue. assist/support to right leg with last 3 reps due to fatigue.   Lumbar Exercises: Quadruped  Single Arm Raise Right;Left;10 reps;Limitations  Straight Leg Raise 10 reps;Limitations  Single Arm Raises Limitations cues on form and technique. red pball under abdomen for support  Straight Leg Raises Limitations cues on form and technique. red pball under abdomen for support           PT Short Term Goals - 03/21/17 1524      PT SHORT TERM GOAL #1   Title Pt will be independent with HEP for improved strength, balance, and gait.  TARGET 04/22/17   Time 5   Period Weeks   Status New     PT SHORT TERM GOAL #2   Title Pt will improve Berg score to at least 28/56 for decreased fall risk.   Time 5   Period Weeks   Status New     PT SHORT TERM GOAL #3   Title Pt will improve TUG score to less than or equal to 19 seconds for decreased fall risk.   Time 5   Period Weeks   Status New     PT SHORT TERM GOAL #4   Title Pt will perform at least 8 of 10 reps of sit<>stand transfers with minimal UE support, modified independently and no LOB, for improved safety and efficiency of transfers.   Time 5   Period Weeks   Status New     PT SHORT TERM GOAL #5   Title Pt will ambulate at least 300 ft using rollator walker, supervision , for improved gait efficiency and safety.   Time 5   Period Weeks   Status New           PT Long Term Goals - 03/21/17 1533      PT LONG TERM GOAL #1   Title Pt will verbalize understanding of fall prevention in the home environment.  TARGET 05/20/17   Time 9   Period Weeks   Status New     PT LONG TERM GOAL #2   Title Pt will improve Berg Balance score to at least 35/56 for decreased fall risk.   Time 9   Period Weeks   Status New     PT LONG TERM GOAL  #3   Title Pt will improve TUG score to less than or equal to  15 seconds for decreased fall risk.   Time 5   Period Weeks   Status New     PT LONG TERM GOAL #4   Title Pt will improve gait velocity to at least 2 ft/sec for decreased fall risk.   Time 9   Period Weeks   Status New     PT LONG TERM GOAL #5   Title Pt will ambulate at least 50-75 ft using cane, with supervision, for improved independence/gait safety in home.   Time 9   Period Weeks   Status New        04/08/17 1539  Plan  Clinical Impression Statement Today's skilled session focused on core and LE strengthening out of TLSO brace on mat and seated on pball without any pain reported during session. Pt is making steady progress and should benefit from continued PT to progress toward unmet goals.   Pt will benefit from skilled therapeutic intervention in order to improve on the following deficits Abnormal gait;Decreased balance;Decreased strength;Difficulty walking;Impaired sensation;Postural dysfunction  Rehab Potential Good  Clinical Impairments Affecting Rehab Potential severity of deficits; pt is motivated for therapy   PT Frequency 2x / week  PT Duration 8 weeks  PT Treatment/Interventions ADLs/Self Care Home Management;Gait training;DME Instruction;Functional mobility training;Therapeutic activities;Therapeutic exercise;Balance training;Neuromuscular re-education;Patient/family education;Orthotic Fit/Training  PT Next Visit Plan continue to work on core stability/back strengthening exercises in sitting/quadruped/prone with pillow without brace on and standing with brace on; RLE strengthening and standing balance exercises; with gait progression as able  Consulted and Agree with Plan of Care Patient;Family member/caregiver  Family Member Consulted Husband      Patient will benefit from skilled therapeutic intervention in order to improve the following deficits and impairments:  Abnormal gait, Decreased balance,  Decreased strength, Difficulty walking, Impaired sensation, Postural dysfunction  Visit Diagnosis: Unsteadiness on feet  Muscle weakness (generalized)     Problem List Patient Active Problem List   Diagnosis Date Noted  . Sensation of pressure in bladder area 01/31/2017  . Paraplegia, incomplete ([facility]) 01/07/2017  . Neurogenic bladder 12/27/2016  . 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/27/2016  . Myelopathy ([facility]) 12/21/2016  . Surgery, elective   . Diabetes mellitus type 2 in nonobese (HCC)   . History of lumbar fusion   . History of fusion of cervical spine   . Neuropathic pain   . Post-operative pain   . 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 Paraparesis ([facility]) 12/15/2016  . Pedal edema 12/14/2016  . Type 2 diabetes mellitus without complication, without long-term current use of insulin ([facility]) 11/02/2016  . Hyperlipidemia 11/02/2016  . Essential hypertension 11/02/2016  . 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    [name], PTA, [facility] [address] [place], [place] [address] [phone] 04/10/17, 12:55 PM   Name: Megan [name] MRN: [id number] Date of Birth: 1950/10/12

## 2017-04-12 ENCOUNTER — Ambulatory Visit: Payer: BLUE CROSS/BLUE SHIELD | Admitting: Physical Therapy

## 2017-04-12 ENCOUNTER — Encounter: Payer: Self-pay | Admitting: Physical Therapy

## 2017-04-12 DIAGNOSIS — R2689 Other abnormalities of gait and mobility: Secondary | ICD-10-CM | POA: Diagnosis not present

## 2017-04-12 DIAGNOSIS — M6281 Muscle weakness (generalized): Secondary | ICD-10-CM

## 2017-04-12 DIAGNOSIS — R2681 Unsteadiness on feet: Secondary | ICD-10-CM

## 2017-04-13 LAB — HM DIABETES EYE EXAM

## 2017-04-13 NOTE — Therapy (Signed)
[facility] [address] [place], [place], [address] Phone: [phone]   Fax:  [phone]  Physical Therapy Treatment  Patient Details  Name: Megan [name] MRN: [id number] Date of Birth: 09-Aug-1951 Referring Provider: Letta Pate  Encounter Date: 04/12/2017      PT End of Session - 04/12/17 1779    Visit Number 7   Number of Visits 18   Date for PT Re-Evaluation 05/20/17   Authorization Type BCBS-30 visit limit, 19 visits remaining   Authorization - Visit Number 7   Authorization - Number of Visits 19   PT Start Time 3903   PT Stop Time 1615   PT Time Calculation (min) 40 min   Equipment Utilized During Treatment Gait belt   Activity Tolerance Patient tolerated treatment well   Behavior During Therapy WFL for tasks assessed/performed      Past Medical History:  Diagnosis Date  . Arthritis   . Borderline diabetic   . GERD (gastroesophageal reflux disease)   . History of bronchitis 2005  . History of kidney stones   . History of shingles   . Hyperlipidemia    takes Fish Oil daily  . Neuromuscular disorder (HCC)    tingling toes  . 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[facility]) 12/15/2016  . Pneumonia 2009  . PONV (postoperative nausea and vomiting)   . Restless leg   . Type 2 diabetes mellitus ([facility])   . Weakness    numbness and tingling in both feet r/t back    Past Surgical History:  Procedure Laterality Date  . ABDOMINAL HYSTERECTOMY  1979  . APPLICATION OF ROBOTIC ASSISTANCE FOR SPINAL PROCEDURE  12/19/2016   Procedure: APPLICATION OF ROBOTIC ASSISTANCE FOR SPINAL PROCEDURE;  Surgeon: Kevan Ny Ditty, MD;  Location: [facility];  Service: Neurosurgery;;  . blADder tacked   1991  . CERVICAL FUSION     2001  . HERNIA REPAIR  1991  . LUMBAR FUSION  2016   L4-5  . LUMBAR FUSION  2017   L2-3  . TONSILLECTOMY AND ADENOIDECTOMY  1959  . TUMOR EXCISION     WERTHIN'S TUMORS BOTH SIDES OF NECK    There were  no vitals filed for this visit.      Subjective Assessment - 04/12/17 1537    Subjective Reports her left knee now wants to give way everytime she stands up. Started last Wed as once in a while with standing, now it's everytime. She has noticed if she goes slow once she is standing it's okay after a few steps until she sits down again. Does not happen when she gets up in the am after sleeping all night, even after sitting at edge of bed to get rollator closer to her. Still weaning out of brace.    Patient is accompained by: Family member  husband, [name]   Pertinent History  left T10 costotransversectomy, T10-11 laminectomy for decompression, dorsal internal fixation and fusion with bilateral pedicle screws T9-T12 12/19/2016 per Dr. Cyndy Freeze, then inpatient rehab, HHPT was discharge about 2 weeks ago.  PMH arthritis, GERD, L4-5 lumbar fusion 2016, L2-3 lumbar fusion 2017, cervical fusion 2001   Patient Stated Goals Pt's goals for therapy are to get R leg stronger than what it is now; perhaps be able to walk with a cane   Currently in Pain? No/denies            [facility] PT Assessment - 04/12/17 1610      ROM / Strength  AROM / PROM / Strength Strength     Strength   Strength Assessment Site Knee;Hip   Right Hip Flexion 3/5   Right Hip ABduction 4-/5   Right Hip ADduction 4-/5   Left Hip Flexion 3+/5   Left Hip ABduction 4-/5   Left Hip ADduction 4-/5   Right Knee Flexion 4/5   Right Knee Extension 3/5   Left Knee Flexion 4/5   Left Knee Extension 4/5            [facility] Adult PT Treatment/Exercise - 04/12/17 1545      Lumbar Exercises: Supine   Clam 10 reps;5 seconds;Limitations   Clam Limitations with red band resistance, cues for abdominal bracing with each rep as well.    Bent Knee Raise 10 reps;Limitations   Bent Knee Raise Limitations alternating legs with cues to maintain abdominal bracing with leg lifts   Bridge Non-compliant;10 reps;5 seconds;Limitations   Bridge  Limitations arms as side for 5 sec hold x 10 reps, progressing to legs over red pball with arms at sides x 10 reps, progressing to arms across chest 5 sec holds x 10 reps. cues for full pelvic lift and to keep pelvis level with lifting.                             Other Supine Lumbar Exercises with red pball under legs: lower trunk rotation left to right with 5 sec holds x 10 reps each way with cues on abdominal bracing.      Lumbar Exercises: Quadruped   Single Arm Raise Right;Left;10 reps   Single Arm Raise Weights (lbs) assist needed for balance and stability over ball, cues on ex form   Straight Leg Raise 10 reps;Limitations   Straight Leg Raises Limitations cues on form and technique. red pball under abdomen for support            PT Short Term Goals - 03/21/17 1524      PT SHORT TERM GOAL #1   Title Pt will be independent with HEP for improved strength, balance, and gait.  TARGET 04/22/17   Time 5   Period Weeks   Status New     PT SHORT TERM GOAL #2   Title Pt will improve Berg score to at least 28/56 for decreased fall risk.   Time 5   Period Weeks   Status New     PT SHORT TERM GOAL #3   Title Pt will improve TUG score to less than or equal to 19 seconds for decreased fall risk.   Time 5   Period Weeks   Status New     PT SHORT TERM GOAL #4   Title Pt will perform at least 8 of 10 reps of sit<>stand transfers with minimal UE support, modified independently and no LOB, for improved safety and efficiency of transfers.   Time 5   Period Weeks   Status New     PT SHORT TERM GOAL #5   Title Pt will ambulate at least 300 ft using rollator walker, supervision , for improved gait efficiency and safety.   Time 5   Period Weeks   Status New           PT Long Term Goals - 03/21/17 1533      PT LONG TERM GOAL #1   Title Pt will verbalize understanding of fall prevention in the home environment.  TARGET 05/20/17   Time  9   Period Weeks   Status New     PT LONG  TERM GOAL #2   Title Pt will improve Berg Balance score to at least 35/56 for decreased fall risk.   Time 9   Period Weeks   Status New     PT LONG TERM GOAL #3   Title Pt will improve TUG score to less than or equal to 15 seconds for decreased fall risk.   Time 5   Period Weeks   Status New     PT LONG TERM GOAL #4   Title Pt will improve gait velocity to at least 2 ft/sec for decreased fall risk.   Time 9   Period Weeks   Status New     PT LONG TERM GOAL #5   Title Pt will ambulate at least 50-75 ft using cane, with supervision, for improved independence/gait safety in home.   Time 9   Period Weeks   Status New            Plan - 04/12/17 1542    Clinical Impression Statement Today's skilled session continued to address core strengthening. Also rechecked LE strengthening today with no significant changes noted. Will continue to monitor pt's new reports of left LE weakness. Pt is progressing toward goals and should benefit form continued PT to progress towrard unmet goals.   Rehab Potential Good   Clinical Impairments Affecting Rehab Potential severity of deficits; pt is motivated for therapy    PT Frequency 2x / week   PT Duration 8 weeks   PT Treatment/Interventions ADLs/Self Care Home Management;Gait training;DME Instruction;Functional mobility training;Therapeutic activities;Therapeutic exercise;Balance training;Neuromuscular re-education;Patient/family education;Orthotic Fit/Training   PT Next Visit Plan continue to work on core stability/back strengthening exercises in sitting/quadruped/prone with pillow without brace on and standing with brace on; RLE strengthening and standing balance exercises; with gait progression as able   Consulted and Agree with Plan of Care Patient;Family member/caregiver   Family Member Consulted Husband      Patient will benefit from skilled therapeutic intervention in order to improve the following deficits and impairments:  Abnormal gait,  Decreased balance, Decreased strength, Difficulty walking, Impaired sensation, Postural dysfunction  Visit Diagnosis: Unsteadiness on feet  Muscle weakness (generalized)     Problem List Patient Active Problem List   Diagnosis Date Noted  . Sensation of pressure in bladder area 01/31/2017  . Paraplegia, incomplete ([facility]) 01/07/2017  . Neurogenic bladder 12/27/2016  . 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/27/2016  . Myelopathy ([facility]) 12/21/2016  . Surgery, elective   . Diabetes mellitus type 2 in nonobese (HCC)   . History of lumbar fusion   . History of fusion of cervical spine   . Neuropathic pain   . Post-operative pain   . 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 Paraparesis ([facility]) 12/15/2016  . Pedal edema 12/14/2016  . Type 2 diabetes mellitus without complication, without long-term current use of insulin ([facility]) 11/02/2016  . Hyperlipidemia 11/02/2016  . Essential hypertension 11/02/2016  . 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    [name], PTA, [facility] [address], [place], [place] [address] [phone] 04/13/17, 12:16 AM   Name: KAMRYN MESSINEO MRN: [id number] Date of Birth: 11-11-1950

## 2017-04-14 ENCOUNTER — Ambulatory Visit: Payer: BLUE CROSS/BLUE SHIELD | Attending: Physical Medicine & Rehabilitation | Admitting: Physical Therapy

## 2017-04-14 DIAGNOSIS — R2689 Other abnormalities of gait and mobility: Secondary | ICD-10-CM

## 2017-04-14 DIAGNOSIS — M6281 Muscle weakness (generalized): Secondary | ICD-10-CM | POA: Diagnosis not present

## 2017-04-14 DIAGNOSIS — R2681 Unsteadiness on feet: Secondary | ICD-10-CM

## 2017-04-14 NOTE — Therapy (Signed)
[facility] [address] [place], [place], [address] Phone: [phone]   Fax:  [phone]  Physical Therapy Treatment  Patient Details  Name: Megan [name] MRN: [id number] Date of Birth: 1951-07-21 Referring Provider: Letta Pate  Encounter Date: 04/14/2017      PT End of Session - 04/14/17 2108    Visit Number 8   Number of Visits 18   Date for PT Re-Evaluation 05/20/17   Authorization Type BCBS-30 visit limit, 19 visits remaining   Authorization - Visit Number 8   Authorization - Number of Visits 19   PT Start Time 1529   PT Stop Time 1618   PT Time Calculation (min) 49 min      Past Medical History:  Diagnosis Date  . Arthritis   . Borderline diabetic   . GERD (gastroesophageal reflux disease)   . History of bronchitis 2005  . History of kidney stones   . History of shingles   . Hyperlipidemia    takes Fish Oil daily  . Neuromuscular disorder (HCC)    tingling toes  . 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[place]) 12/15/2016  . Pneumonia 2009  . PONV (postoperative nausea and vomiting)   . Restless leg   . Type 2 diabetes mellitus ([place])   . Weakness    numbness and tingling in both feet r/t back    Past Surgical History:  Procedure Laterality Date  . ABDOMINAL HYSTERECTOMY  1979  . APPLICATION OF ROBOTIC ASSISTANCE FOR SPINAL PROCEDURE  12/19/2016   Procedure: APPLICATION OF ROBOTIC ASSISTANCE FOR SPINAL PROCEDURE;  Surgeon: Kevan Ny Ditty, MD;  Location: [facility];  Service: Neurosurgery;;  . blADder tacked   1991  . CERVICAL FUSION     2001  . HERNIA REPAIR  1991  . LUMBAR FUSION  2016   L4-5  . LUMBAR FUSION  2017   L2-3  . TONSILLECTOMY AND ADENOIDECTOMY  1959  . TUMOR EXCISION     WERTHIN'S TUMORS BOTH SIDES OF NECK    There were no vitals filed for this visit.      Subjective Assessment - 04/14/17 2053    Subjective Pt reports left knee has tendency to buckle/"give way" when she gets  up from sitting for first few steps; wearing brace today - states she wore to have for back support with prolonged standing - some low back discomfort with standing in one place too long   Patient is accompained by: Family member   Pertinent History  left T10 costotransversectomy, T10-11 laminectomy for decompression, dorsal internal fixation and fusion with bilateral pedicle screws T9-T12 12/19/2016 per Dr. Cyndy Freeze, then inpatient rehab, HHPT was discharge about 2 weeks ago.  PMH arthritis, GERD, L4-5 lumbar fusion 2016, L2-3 lumbar fusion 2017, cervical fusion 2001   Patient Stated Goals Pt's goals for therapy are to get R leg stronger than what it is now; perhaps be able to walk with a cane   Currently in Pain? No/denies                         [facility] Adult PT Treatment/Exercise - 04/14/17 1543      Transfers   Transfers Sit to Stand   Sit to Stand 6: Modified independent (Device/Increase time)   Comments UE support needed for sit to stand from mat     Ambulation/Gait   Ambulation/Gait Yes   Ambulation/Gait Assistance 5: Supervision   Ambulation/Gait Assistance Details VC to decrease  UE support as able with pt amb. inside // bars   Ambulation Distance (Feet) 80 Feet   Assistive device Parallel bars  mirror used for visual feedback and to decr. UE support   Gait Pattern Step-through pattern;Decreased step length - right;Decreased stance time - right;Right genu recurvatum;Trunk flexed;Poor foot clearance - right;Decreased hip/knee flexion - right   Ambulation Surface Level;Indoor     Lumbar Exercises: Stretches   Active Hamstring Stretch 1 rep;30 seconds  RLE     Lumbar Exercises: Machines for Strengthening   Leg Press 40# bil. LE's 10 reps:  45# 15 reps:  RLE only 30# 15 reps     Lumbar Exercises: Supine   Bent Knee Raise 10 reps   Bridge Compliant;5 reps   Straight Leg Raise 10 reps   Other Supine Lumbar Exercises Bridging with hip abdct/adduction x 5 reps:   bridging with RLE extension x 5 reps; pt unable to extend LLE in bridged position     Knee/Hip Exercises: Standing   Forward Step Up Right;1 set;10 reps;Hand Hold: 2;Step Height: 6"   Step Down Right;1 set;Hand Hold: 2;Step Height: 4"  performed inside // bars     Knee/Hip Exercises: Seated   Hamstring Curl Strengthening;Right;1 set;10 reps  with yellow theraband     Knee/Hip Exercises: Sidelying   Hip ABduction AROM;Right;1 set;10 reps   Clams 3# weight used on RLE; 10 reps     Ankle Exercises: Stretches   Gastroc Stretch 1 rep;30 seconds  RLE - used incline for Rt gastroc stretch             Balance Exercises - 04/14/17 2104      Balance Exercises: Standing   Standing Eyes Opened Wide (BOA);Solid surface;1 rep;Time  1" x 2 reps without UE support   Rockerboard Anterior/posterior;Lateral;EO;30 seconds;UE support   Other Standing Exercises alternate tap ups to 4" step with UE support prn 10 reps            PT Education - 04/14/17 2105    Education provided Yes   Education Details added gastroc stretch to HEP:  added yellow theraband for resistance for Rt hamstring strengthening (seated position)   Person(s) Educated Patient   Methods Explanation;Handout;Demonstration   Comprehension Verbalized understanding;Returned demonstration          PT Short Term Goals - 03/21/17 1524      PT SHORT TERM GOAL #1   Title Pt will be independent with HEP for improved strength, balance, and gait.  TARGET 04/22/17   Time 5   Period Weeks   Status New     PT SHORT TERM GOAL #2   Title Pt will improve Berg score to at least 28/56 for decreased fall risk.   Time 5   Period Weeks   Status New     PT SHORT TERM GOAL #3   Title Pt will improve TUG score to less than or equal to 19 seconds for decreased fall risk.   Time 5   Period Weeks   Status New     PT SHORT TERM GOAL #4   Title Pt will perform at least 8 of 10 reps of sit<>stand transfers with minimal UE support,  modified independently and no LOB, for improved safety and efficiency of transfers.   Time 5   Period Weeks   Status New     PT SHORT TERM GOAL #5   Title Pt will ambulate at least 300 ft using rollator walker, supervision , for improved  gait efficiency and safety.   Time 5   Period Weeks   Status New           PT Long Term Goals - 03/21/17 1533      PT LONG TERM GOAL #1   Title Pt will verbalize understanding of fall prevention in the home environment.  TARGET 05/20/17   Time 9   Period Weeks   Status New     PT LONG TERM GOAL #2   Title Pt will improve Berg Balance score to at least 35/56 for decreased fall risk.   Time 9   Period Weeks   Status New     PT LONG TERM GOAL #3   Title Pt will improve TUG score to less than or equal to 15 seconds for decreased fall risk.   Time 5   Period Weeks   Status New     PT LONG TERM GOAL #4   Title Pt will improve gait velocity to at least 2 ft/sec for decreased fall risk.   Time 9   Period Weeks   Status New     PT LONG TERM GOAL #5   Title Pt will ambulate at least 50-75 ft using cane, with supervision, for improved independence/gait safety in home.   Time 9   Period Weeks   Status New               Plan - 04/14/17 2110    Clinical Impression Statement Pt unable to tolerate lying prone due to c/o low back pain/discomfort, therefore, unable to perform hip and hamstring strengthening in anti-gravity position;  no occurrences of Lt knee buckling observed, however, pt reported feeling some instability during session:  pt has significant Rt ankle, hamstring and Rt hip musc. weakness                                                                                                                                        Rehab Potential Good   Clinical Impairments Affecting Rehab Potential severity of deficits; pt is motivated for therapy    PT Frequency 2x / week   PT Duration 8 weeks   PT Treatment/Interventions ADLs/Self  Care Home Management;Gait training;DME Instruction;Functional mobility training;Therapeutic activities;Therapeutic exercise;Balance training;Neuromuscular re-education;Patient/family education;Orthotic Fit/Training   PT Next Visit Plan cont RLE strengthening, balance and gait training   Consulted and Agree with Plan of Care Patient;Family member/caregiver   Family Member Consulted Husband      Patient will benefit from skilled therapeutic intervention in order to improve the following deficits and impairments:  Abnormal gait, Decreased balance, Decreased strength, Difficulty walking, Impaired sensation, Postural dysfunction  Visit Diagnosis: Muscle weakness (generalized)  Other abnormalities of gait and mobility  Unsteadiness on feet     Problem List Patient Active Problem List   Diagnosis Date Noted  . Sensation of pressure in bladder area 01/31/2017  . Paraplegia, incomplete (  [place]) 01/07/2017  . Neurogenic bladder 12/27/2016  . 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/27/2016  . Myelopathy ([place]) 12/21/2016  . Surgery, elective   . Diabetes mellitus type 2 in nonobese (HCC)   . History of lumbar fusion   . History of fusion of cervical spine   . Neuropathic pain   . Post-operative pain   . 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 Paraparesis ([place]) 12/15/2016  . Pedal edema 12/14/2016  . Type 2 diabetes mellitus without complication, without long-term current use of insulin ([place]) 11/02/2016  . Hyperlipidemia 11/02/2016  . Essential hypertension 11/02/2016  . 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    Alda Lea, PT 04/14/2017, 9:19 PM  [facility] [address] [place], [place], [address] Phone: [phone]   Fax:  [phone]  Name: Megan [name] MRN: [id number] Date of Birth: 07/28/1951

## 2017-04-14 NOTE — Patient Instructions (Signed)
Gastroc / Heel Cord Stretch - On Step    Stand with heels over edge of stair. Holding rail, lower heels until stretch is felt in calf of legs.  HOLD 30-60 secs. (Right leg) Repeat _2_ times. Do _3_ times per day.  Copyright  VHI. All rights reserved.

## 2017-04-18 ENCOUNTER — Encounter: Payer: Self-pay | Admitting: Physical Therapy

## 2017-04-18 ENCOUNTER — Other Ambulatory Visit: Payer: Self-pay

## 2017-04-18 ENCOUNTER — Ambulatory Visit: Payer: BLUE CROSS/BLUE SHIELD | Admitting: Physical Therapy

## 2017-04-18 DIAGNOSIS — R2681 Unsteadiness on feet: Secondary | ICD-10-CM

## 2017-04-18 DIAGNOSIS — M6281 Muscle weakness (generalized): Secondary | ICD-10-CM | POA: Diagnosis not present

## 2017-04-18 DIAGNOSIS — R2689 Other abnormalities of gait and mobility: Secondary | ICD-10-CM

## 2017-04-18 MED ORDER — GABAPENTIN 300 MG PO CAPS: 300.0000 mg | ORAL_CAPSULE | Freq: Three times a day (TID) | ORAL | 2 refills | Status: DC

## 2017-04-19 NOTE — Therapy (Signed)
[facility] [address] [place], [place], [address] Phone: [phone]   Fax:  [phone]  Physical Therapy Treatment  Patient Details  Name: Megan [name] MRN: [id number] Date of Birth: May 07, 1951 Referring Provider: Letta Pate  Encounter Date: 04/18/2017      PT End of Session - 04/18/17 1542    Visit Number 9   Number of Visits 18   Date for PT Re-Evaluation 05/20/17   Authorization Type BCBS-30 visit limit, 19 visits remaining   Authorization - Visit Number 9   Authorization - Number of Visits 19   PT Start Time 5465   PT Stop Time 6812  pt needed to leave early for another apt   PT Time Calculation (min) 34 min   Equipment Utilized During Treatment Gait belt   Activity Tolerance Patient tolerated treatment well   Behavior During Therapy [facility] for tasks assessed/performed      Past Medical History:  Diagnosis Date  . Arthritis   . Borderline diabetic   . GERD (gastroesophageal reflux disease)   . History of bronchitis 2005  . History of kidney stones   . History of shingles   . Hyperlipidemia    takes Fish Oil daily  . Neuromuscular disorder (HCC)    tingling toes  . 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[place]) 12/15/2016  . Pneumonia 2009  . PONV (postoperative nausea and vomiting)   . Restless leg   . Type 2 diabetes mellitus ([place])   . Weakness    numbness and tingling in both feet r/t back    Past Surgical History:  Procedure Laterality Date  . ABDOMINAL HYSTERECTOMY  1979  . APPLICATION OF ROBOTIC ASSISTANCE FOR SPINAL PROCEDURE  12/19/2016   Procedure: APPLICATION OF ROBOTIC ASSISTANCE FOR SPINAL PROCEDURE;  Surgeon: Kevan Ny Ditty, MD;  Location: [facility];  Service: Neurosurgery;;  . blADder tacked   1991  . CERVICAL FUSION     2001  . HERNIA REPAIR  1991  . LUMBAR FUSION  2016   L4-5  . LUMBAR FUSION  2017   L2-3  . TONSILLECTOMY AND ADENOIDECTOMY  1959  . TUMOR EXCISION     WERTHIN'S  TUMORS BOTH SIDES OF NECK    There were no vitals filed for this visit.      Subjective Assessment - 04/18/17 1536    Subjective Still having issues with left knee wanting to buckle/give way when standing, just not as bad. Also ran out of her gabapentin, waiting on MD to call in refill (called them over 2 weeks ago).                        Patient is accompained by: Family member  spouse   Pertinent History  left T10 costotransversectomy, T10-11 laminectomy for decompression, dorsal internal fixation and fusion with bilateral pedicle screws T9-T12 12/19/2016 per Dr. Cyndy Freeze, then inpatient rehab, HHPT was discharge about 2 weeks ago.  PMH arthritis, GERD, L4-5 lumbar fusion 2016, L2-3 lumbar fusion 2017, cervical fusion 2001   Patient Stated Goals Pt's goals for therapy are to get R leg stronger than what it is now; perhaps be able to walk with a cane   Currently in Pain? No/denies   Pain Score 0-No pain            [facility] PT Assessment - 04/18/17 1543      Standardized Balance Assessment   Standardized Balance Assessment Timed Up and Go Test;Berg Balance  Test     Berg Balance Test   Sit to Stand Able to stand  independently using hands   Standing Unsupported Able to stand safely 2 minutes   Sitting with Back Unsupported but Feet Supported on Floor or Stool Able to sit safely and securely 2 minutes   Stand to Sit Controls descent by using hands   Transfers Able to transfer safely, definite need of hands   Standing Unsupported with Eyes Closed Able to stand 10 seconds with supervision   Standing Ubsupported with Feet Together Able to place feet together independently and stand for 1 minute with supervision   From Standing, Reach Forward with Outstretched Arm Can reach forward >12 cm safely (5")  6.5 inches   From Standing Position, Pick up Object from Floor Unable to try/needs assist to keep balance   From Standing Position, Turn to Look Behind Over each Shoulder Turn sideways only  but maintains balance   Turn 360 Degrees Needs assistance while turning   Standing Unsupported, Alternately Place Feet on Step/Stool Needs assistance to keep from falling or unable to try   Standing Unsupported, One Foot in Front Able to take small step independently and hold 30 seconds   Standing on One Leg Unable to try or needs assist to prevent fall   Total Score 30     Timed Up and Go Test   Normal TUG (seconds) 16.69  one episode of toe scuffing, min assist to correct   TUG Comments Scores >13.5 seconds indicate increased fall risk.             [facility] Adult PT Treatment/Exercise - 04/18/17 1543      Transfers   Transfers Sit to Stand   Sit to Stand 6: Modified independent (Device/Increase time);With upper extremity assist;From bed;From chair/3-in-1   Stand to Sit 6: Modified independent (Device/Increase time);With upper extremity assist;To bed;To chair/3-in-1     Ambulation/Gait   Ambulation/Gait Yes   Ambulation/Gait Assistance 5: Supervision   Ambulation/Gait Assistance Details occasional toe scuffing (x1 each foot) with pt self correcting. cues on posture and to stay closer to rollator with gait.    Ambulation Distance (Feet) 325 Feet  x1   Assistive device Rollator   Gait Pattern Step-through pattern;Decreased step length - right;Decreased stance time - right;Right genu recurvatum;Trunk flexed;Poor foot clearance - right;Decreased hip/knee flexion - right   Ambulation Surface Level;Indoor   Gait velocity 15.35 sec's= 2.14 ft/sec with rollator            PT Short Term Goals - 04/18/17 1543      PT SHORT TERM GOAL #1   Title Pt will be independent with HEP for improved strength, balance, and gait.  TARGET 04/22/17   Time 5   Period Weeks   Status On-going     PT SHORT TERM GOAL #2   Title Pt will improve Berg score to at least 28/56 for decreased fall risk.   Baseline 04/18/17: 30/56 scored today   Time --   Period --   Status Achieved     PT SHORT TERM  GOAL #3   Title Pt will improve TUG score to less than or equal to 19 seconds for decreased fall risk.   Baseline 04/18/17: 16.69 sec's with rollator   Time --   Period --   Status Achieved     PT SHORT TERM GOAL #4   Title Pt will perform at least 8 of 10 reps of sit<>stand transfers with minimal UE support,  modified independently and no LOB, for improved safety and efficiency of transfers.   Time 5   Period Weeks   Status On-going     PT SHORT TERM GOAL #5   Title Pt will ambulate at least 300 ft using rollator walker, supervision , for improved gait efficiency and safety.   Baseline 04/18/17: met today   Time --   Period --   Status Achieved           PT Long Term Goals - 03/21/17 1533      PT LONG TERM GOAL #1   Title Pt will verbalize understanding of fall prevention in the home environment.  TARGET 05/20/17   Time 9   Period Weeks   Status New     PT LONG TERM GOAL #2   Title Pt will improve Berg Balance score to at least 35/56 for decreased fall risk.   Time 9   Period Weeks   Status New     PT LONG TERM GOAL #3   Title Pt will improve TUG score to less than or equal to 15 seconds for decreased fall risk.   Time 5   Period Weeks   Status New     PT LONG TERM GOAL #4   Title Pt will improve gait velocity to at least 2 ft/sec for decreased fall risk.   Time 9   Period Weeks   Status New     PT LONG TERM GOAL #5   Title Pt will ambulate at least 50-75 ft using cane, with supervision, for improved independence/gait safety in home.   Time 9   Period Weeks   Status New               Plan - 04/18/17 1542    Clinical Impression Statement Today's skilled session focused on checking progress toward STGs with 3/5 met today. Will check remaining goals at next session. Pt should benefit from continued PT to progress toward unmet goals.    Rehab Potential Good   Clinical Impairments Affecting Rehab Potential severity of deficits; pt is motivated for therapy     PT Frequency 2x / week   PT Duration 8 weeks   PT Treatment/Interventions ADLs/Self Care Home Management;Gait training;DME Instruction;Functional mobility training;Therapeutic activities;Therapeutic exercise;Balance training;Neuromuscular re-education;Patient/family education;Orthotic Fit/Training   PT Next Visit Plan check remaining STGs; continued with right LE strengthening, balance and gait training   Consulted and Agree with Plan of Care Patient;Family member/caregiver   Family Member Consulted Husband      Patient will benefit from skilled therapeutic intervention in order to improve the following deficits and impairments:  Abnormal gait, Decreased balance, Decreased strength, Difficulty walking, Impaired sensation, Postural dysfunction  Visit Diagnosis: Muscle weakness (generalized)  Other abnormalities of gait and mobility  Unsteadiness on feet     Problem List Patient Active Problem List   Diagnosis Date Noted  . Sensation of pressure in bladder area 01/31/2017  . Paraplegia, incomplete ([place]) 01/07/2017  . Neurogenic bladder 12/27/2016  . 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/27/2016  . Myelopathy ([place]) 12/21/2016  . Surgery, elective   . Diabetes mellitus type 2 in nonobese (HCC)   . History of lumbar fusion   . History of fusion of cervical spine   . Neuropathic pain   . Post-operative pain   . 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 Paraparesis ([place]) 12/15/2016  . Pedal edema 12/14/2016  . Type 2 diabetes mellitus without complication, without long-term current use of insulin (  [place]) 11/02/2016  . Hyperlipidemia 11/02/2016  . Essential hypertension 11/02/2016  . 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    [name], PTA, [facility] [address] [place], [place] [address] [phone] 04/19/17, 7:53 PM   Name: Megan [name] MRN: [id number] Date of  Birth: Mar 07, 1951

## 2017-04-20 ENCOUNTER — Ambulatory Visit: Payer: BLUE CROSS/BLUE SHIELD | Admitting: Physical Therapy

## 2017-04-20 ENCOUNTER — Other Ambulatory Visit: Payer: Self-pay

## 2017-04-20 DIAGNOSIS — E119 Type 2 diabetes mellitus without complications: Secondary | ICD-10-CM

## 2017-04-20 DIAGNOSIS — M6281 Muscle weakness (generalized): Secondary | ICD-10-CM | POA: Diagnosis not present

## 2017-04-20 DIAGNOSIS — R2681 Unsteadiness on feet: Secondary | ICD-10-CM

## 2017-04-20 MED ORDER — METFORMIN HCL 500 MG PO TABS: 500.0000 mg | ORAL_TABLET | Freq: Two times a day (BID) | ORAL | 2 refills | Status: DC

## 2017-04-20 NOTE — Telephone Encounter (Signed)
Please apologize for the confusion. Have her take 1 tablet twice daily with food. New Rx sent to pharmacy. Needs office visit follow up for diabetes in three months. Please schedule.

## 2017-04-20 NOTE — Telephone Encounter (Signed)
Pt request refill metformin to [facility]. Pt said Allie Bossier NP started pt on Metformin first of year taking metformin 500 mg one bid. Pt had surgery and rehab doctor filled metformin. On med list is metformin 500 mg taking one daily with breakfast. Last filled # 30 x 1 on 01/05/17 by Lauraine Rinne PA.  Pt wants future refills from Allie Bossier NP. Pt last saw Anda Kraft on 01/19/17. Per 02/01/17 lab note pt was to continue metformin 500 mg daily as prescribed. Wanted to clarify is pt was to take metformin 500 mg daily or bid.Please advise.

## 2017-04-20 NOTE — Telephone Encounter (Signed)
Spoken and notified patient of Kate's comments. Patient verbalized understanding.  Follow up on 07/25/2017

## 2017-04-22 ENCOUNTER — Ambulatory Visit (HOSPITAL_BASED_OUTPATIENT_CLINIC_OR_DEPARTMENT_OTHER): Payer: BLUE CROSS/BLUE SHIELD | Admitting: Physical Medicine & Rehabilitation

## 2017-04-22 ENCOUNTER — Encounter: Payer: Self-pay | Admitting: Physical Medicine & Rehabilitation

## 2017-04-22 ENCOUNTER — Encounter: Payer: Self-pay | Admitting: Physical Therapy

## 2017-04-22 ENCOUNTER — Encounter: Payer: BLUE CROSS/BLUE SHIELD | Attending: Physical Medicine & Rehabilitation

## 2017-04-22 VITALS — BP 127/66 | HR 71

## 2017-04-22 DIAGNOSIS — G8220 Paraplegia, unspecified: Secondary | ICD-10-CM

## 2017-04-22 DIAGNOSIS — G959 Disease of spinal cord, unspecified: Secondary | ICD-10-CM | POA: Insufficient documentation

## 2017-04-22 DIAGNOSIS — G8222 Paraplegia, incomplete: Secondary | ICD-10-CM

## 2017-04-22 DIAGNOSIS — M4714 Other spondylosis with myelopathy, thoracic region: Secondary | ICD-10-CM | POA: Diagnosis not present

## 2017-04-22 NOTE — Therapy (Signed)
[facility] [address] [place], [place], [address] Phone: [phone]   Fax:  [phone]  Physical Therapy Treatment  Patient Details  Name: Megan [name] MRN: [id number] Date of Birth: May 17, 1951 Referring Provider: Letta Pate  Encounter Date: 04/20/2017      PT End of Session - 04/22/17 1650    Visit Number 10   Number of Visits 18   Date for PT Re-Evaluation 05/20/17   Authorization Type [employer]-30 visit limit, 19 visits remaining   Authorization - Visit Number 10   Authorization - Number of Visits 19   PT Start Time 7062   PT Stop Time 1619   PT Time Calculation (min) 44 min   Equipment Utilized During Treatment Gait belt   Activity Tolerance Patient tolerated treatment well   Behavior During Therapy WFL for tasks assessed/performed      Past Medical History:  Diagnosis Date  . Arthritis   . Borderline diabetic   . GERD (gastroesophageal reflux disease)   . History of bronchitis 2005  . History of kidney stones   . History of shingles   . Hyperlipidemia    takes Fish Oil daily  . Neuromuscular disorder (HCC)    tingling toes  . 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[facility]) 12/15/2016  . Pneumonia 2009  . PONV (postoperative nausea and vomiting)   . Restless leg   . Type 2 diabetes mellitus ([facility])   . Weakness    numbness and tingling in both feet r/t back    Past Surgical History:  Procedure Laterality Date  . ABDOMINAL HYSTERECTOMY  1979  . APPLICATION OF ROBOTIC ASSISTANCE FOR SPINAL PROCEDURE  12/19/2016   Procedure: APPLICATION OF ROBOTIC ASSISTANCE FOR SPINAL PROCEDURE;  Surgeon: Kevan Ny Ditty, MD;  Location: [facility];  Service: Neurosurgery;;  . blADder tacked   1991  . CERVICAL FUSION     2001  . HERNIA REPAIR  1991  . LUMBAR FUSION  2016   L4-5  . LUMBAR FUSION  2017   L2-3  . TONSILLECTOMY AND ADENOIDECTOMY  1959  . TUMOR EXCISION     WERTHIN'S TUMORS BOTH SIDES OF NECK    There  were no vitals filed for this visit.      Subjective Assessment - 04/22/17 1649    Subjective No new complaints. Still has issues with left knee wanting to buckle on standing. No falls or pain to report. Reports her HEP is going well at home.   Patient is accompained by: Family member  spouse   Pertinent History  left T10 costotransversectomy, T10-11 laminectomy for decompression, dorsal internal fixation and fusion with bilateral pedicle screws T9-T12 12/19/2016 per Dr. Cyndy Freeze, then inpatient rehab, HHPT was discharge about 2 weeks ago.  PMH arthritis, GERD, L4-5 lumbar fusion 2016, L2-3 lumbar fusion 2017, cervical fusion 2001   Patient Stated Goals Pt's goals for therapy are to get R leg stronger than what it is now; perhaps be able to walk with a cane   Currently in Pain? No/denies   Pain Score 0-No pain       Treatment:  Exercises Pt was able to demo from her memory all ex's issued to date for HEP with minimal cues needed for abdominal bracing and ex form.   Added the following to HEP today.  LE strengthening Created by [name], PTA Aug 8th, 2018 View at "my-exercise-code.com" using code: [email or web address]   Repeat 10 Times Hold 5 Seconds Complete 1 Set Perform 1 Time(s)  a Day STANDING HEEL RAISES While standing and holding onto kitchen counter top, raise heels up and hold for 5 seconds. slowly lower them back down. Powered by [email or web address].com Created By [name], PTA Aug 8th, 2018 - Page 1 of 3 YOUR HOME PROGRAM LE strengthening Created by [name], PTA Aug 8th, 2018 View at "my-exercise-code.com" using code: [email or web address]   Repeat 10 Times Complete 1 Set Perform 1 Time(s) a Day Mini Squat at Counter Standing with feet shoulder length apart and holding counter. Have chair or locked rollator behind you. Bend knees and stick your booty out as if you are going to sit down and then come back up to full upright standing. Do not let knees go past toes. Powered by [email or web address].com  Created By [name], PTA Aug 8th, 2018 - Page 2 of 3 YOUR HOME PROGRAM LE strengthening Created by [name], PTA Aug 8th, 2018 View at "my-[email or web address]-code.com" using code: [email or web address]   Repeat 10 Times Hold 0 Seconds Complete 1 Set Perform 1 Time(s) a Day hip abduction standing with band Sit down when placing band around ankles. standing while holding onto kitchen counter. Keep knee straight and foot pointed forward while stretching _red_ band out to side and then slowly back in. alternate legs. Powered by [email or web address].com Created By [name], PTA Aug 8th, 2018 - Page 3 of 3     04/20/17 1659  PT Education  Education provided Yes  Education Details added to HEP for strengthening  Person(s) Educated Patient;Spouse  Methods Explanation;Demonstration;Verbal cues;Handout  Comprehension Verbalized understanding;Returned demonstration;Verbal cues required;Need further instruction          PT Short Term Goals - 04/22/17 1651      PT SHORT TERM GOAL #1   Title Pt will be independent with HEP for improved strength, balance, and gait.  TARGET 04/22/17   Baseline 04/20/17: met today with addition of strengtheing ex's added to HEP   Status Achieved     PT SHORT TERM GOAL #2   Title Pt will improve Berg score to at least 28/56 for decreased fall risk.   Baseline 04/18/17: 30/56 scored today   Status Achieved     PT SHORT TERM GOAL #3   Title Pt will improve TUG score to less than or equal to 19 seconds for decreased fall risk.   Baseline 04/18/17: 16.69 sec's with rollator   Status Achieved     PT SHORT TERM GOAL #4   Title Pt will perform at least 8 of 10 reps of sit<>stand transfers with minimal UE support, modified independently and no LOB, for improved safety and efficiency of transfers.   Time 5   Period Weeks   Status On-going     PT SHORT TERM GOAL #5   Title Pt will ambulate at least 300 ft using rollator walker, supervision , for improved gait efficiency and safety.    Baseline 04/18/17: met today   Status Achieved           PT Long Term Goals - 03/21/17 1533      PT LONG TERM GOAL #1   Title Pt will verbalize understanding of fall prevention in the home environment.  TARGET 05/20/17   Time 9   Period Weeks   Status New     PT LONG TERM GOAL #2   Title Pt will improve Berg Balance score to at least 35/56 for decreased fall risk.   Time 9   Period Weeks   Status New  PT LONG TERM GOAL #3   Title Pt will improve TUG score to less than or equal to 15 seconds for decreased fall risk.   Time 5   Period Weeks   Status New     PT LONG TERM GOAL #4   Title Pt will improve gait velocity to at least 2 ft/sec for decreased fall risk.   Time 9   Period Weeks   Status New     PT LONG TERM GOAL #5   Title Pt will ambulate at least 50-75 ft using cane, with supervision, for improved independence/gait safety in home.   Time 9   Period Weeks   Status New        04/20/17 1651  Plan  Clinical Impression Statement Pt was seen during a downtime with our documentation system, therefore not all dates will reflect the treatment date as the data was entered once system was back up. Pt was able to recall all her current exercises and performed them without issues in session today. Added new ex's for strengthening today without any issues reported. Pt is making steady progress toward goals and should benefit from continued PT to progress toward unmet goals.                        Pt will benefit from skilled therapeutic intervention in order to improve on the following deficits Abnormal gait;Decreased balance;Decreased strength;Difficulty walking;Impaired sensation;Postural dysfunction  Rehab Potential Good  Clinical Impairments Affecting Rehab Potential severity of deficits; pt is motivated for therapy   PT Frequency 2x / week  PT Duration 8 weeks  PT Treatment/Interventions ADLs/Self Care Home Management;Gait training;DME Instruction;Functional mobility  training;Therapeutic activities;Therapeutic exercise;Balance training;Neuromuscular re-education;Patient/family education;Orthotic Fit/Training  PT Next Visit Plan check remaining STG (sorry, I only remembed the HEP goal being left and did not have access to her chart during her session due to EPIC being down); continued with right LE strengthening, balance and gait training  Consulted and Agree with Plan of Care Patient;Family member/caregiver  Family Member Consulted Husband     Patient will benefit from skilled therapeutic intervention in order to improve the following deficits and impairments:  Abnormal gait, Decreased balance, Decreased strength, Difficulty walking, Impaired sensation, Postural dysfunction  Visit Diagnosis: Muscle weakness (generalized)  Unsteadiness on feet     Problem List Patient Active Problem List   Diagnosis Date Noted  . Sensation of pressure in bladder area 01/31/2017  . Paraplegia, incomplete ([facility]) 01/07/2017  . Neurogenic bladder 12/27/2016  . 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/27/2016  . Myelopathy ([facility]) 12/21/2016  . Surgery, elective   . Diabetes mellitus type 2 in nonobese (HCC)   . History of lumbar fusion   . History of fusion of cervical spine   . Neuropathic pain   . Post-operative pain   . 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 Paraparesis ([facility]) 12/15/2016  . Pedal edema 12/14/2016  . Type 2 diabetes mellitus without complication, without long-term current use of insulin ([facility]) 11/02/2016  . Hyperlipidemia 11/02/2016  . Essential hypertension 11/02/2016  . 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    [name], PTA, [facility] [address] [place] [employer], [place] [address] [phone] 04/22/17, 4:58 PM   Name: Megan [name] MRN: [id number] Date of Birth: 1951-09-06

## 2017-04-22 NOTE — Progress Notes (Signed)
 Subjective:    Patient ID: Megan [name], female    DOB: 10/07/1950, 65 y.o.   MRN: [id number]  HPI Seen by neurosurgery lst week latest xray looked "ok"  Will f/u quarterly x 1 yr Patient is independent with dressing Uses the walker in smaller stores but will use the electric scooter at [employer] and larger stores  Balance and standing tolerance. Not long enough for cooking. Does some light meal preparation. Washes dishes in a seated position due to concerns about balance  No falls. She had some problems quadruped position at physical therapy on the mat.  Does not feel like her leg is getting stronger.  Has numbness in the entire right lower limb on the medial aspect, having some increased sensation in the lateral aspect of the right thigh. Tingling in the left foot.  Still using methocarbamol about twice a day. Using gabapentin 3 times per day    Pain Inventory Average Pain 7 Pain Right Now 0 My pain is intermittent and tingling  In the last 24 hours, has pain interfered with the following? General activity 1 Relation with others 0 Enjoyment of life 2 What TIME of day is your pain at its worst? evening Sleep (in general) Good  Pain is worse with: standing Pain improves with: rest Relief from Meds: 4  Mobility use a walker ability to climb steps?  yes do you drive?  no  Function retired I need assistance with the following:  meal prep, household duties and shopping  Neuro/Psych numbness tingling trouble walking spasms depression  Prior Studies Any changes since last visit?  no  Physicians involved in your care Any changes since last visit?  no   Family History  Problem Relation Age of Onset  . Diabetes Mother   . Dementia Mother   . Cirrhosis Mother        Non alcoholic  . COPD Father   . Diabetes Brother    Social History   Social History  . Marital status: Married    Spouse name: Vernon  . Number of children: 1  . Years of education:  12   Social History Main Topics  . Smoking status: Former Smoker    Packs/day: 1.50    Types: Cigarettes    Quit date: 05/13/2006  . Smokeless tobacco: Never Used  . Alcohol use 0.0 oz/week     Comment: once in a while  . Drug use: No  . Sexual activity: Not on file   Other Topics Concern  . Not on file   Social History Narrative   Lives w/ husband   Married.   1 child, 1 grandchildren.   Retired. Once worked for [employer].   Enjoys reading.    Past Surgical History:  Procedure Laterality Date  . ABDOMINAL HYSTERECTOMY  1979  . APPLICATION OF ROBOTIC ASSISTANCE FOR SPINAL PROCEDURE  12/19/2016   Procedure: APPLICATION OF ROBOTIC ASSISTANCE FOR SPINAL PROCEDURE;  Surgeon: Benjamin Jared Ditty, MD;  Location: [facility];  Service: Neurosurgery;;  . blADder tacked   1991  . CERVICAL FUSION     2001  . HERNIA REPAIR  1991  . LUMBAR FUSION  2016   L4-5  . LUMBAR FUSION  2017   L2-3  . TONSILLECTOMY AND ADENOIDECTOMY  1959  . TUMOR EXCISION     WERTHIN'S TUMORS BOTH SIDES OF NECK   Past Medical History:  Diagnosis Date  . Arthritis   . Borderline diabetic   . GERD (gastroesophageal reflux   disease)   . History of bronchitis 2005  . History of kidney stones   . History of shingles   . Hyperlipidemia    takes Fish Oil daily  . Neuromuscular disorder (HCC)    tingling toes  . 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[facility]) 12/15/2016  . Pneumonia 2009  . PONV (postoperative nausea and vomiting)   . Restless leg   . Type 2 diabetes mellitus ([facility])   . Weakness    numbness and tingling in both feet r/t back   There were no vitals taken for this visit.  Opioid Risk Score:   Fall Risk Score:  `1  Depression screen PHQ 2/9  Depression screen PHQ 2/9 01/07/2017  Decreased Interest 0  Down, Depressed, Hopeless 0  PHQ - 2 Score 0  Altered sleeping 0  Tired, decreased energy 1  Change in appetite 0  Feeling bad or failure about yourself  0  Trouble concentrating 0  Moving slowly  or fidgety/restless 0  Suicidal thoughts 0  PHQ-9 Score 1     Review of Systems  Constitutional: Negative.   HENT: Negative.   Eyes: Negative.   Respiratory: Negative.   Cardiovascular: Negative.   Gastrointestinal: Negative.   Endocrine: Negative.   Genitourinary: Negative.   Musculoskeletal: Negative.   Skin: Negative.   Allergic/Immunologic: Negative.   Neurological: Negative.   Hematological: Negative.   Psychiatric/Behavioral: Negative.   All other systems reviewed and are negative.      Objective:   Physical Exam  Constitutional: She is oriented to person, place, and time. She appears well-developed and well-nourished.  HENT:  Head: Normocephalic and atraumatic.  Eyes: Pupils are equal, round, and reactive to light. Conjunctivae and EOM are normal.  Neck: Normal range of motion. Neck supple.  Neurological: She is alert and oriented to person, place, and time.  Psychiatric: She has a normal mood and affect.  Nursing note and vitals reviewed.   Motor strength is 5/5 bilateral deltoid, biceps, triceps, grip 2 minus at the right hip flexor, 4 minus. Right knee extensor, 3 minus. Ankle dorsiflexion with inversion and great toe extension synergy, in inversion is 2 minus eversion is 2 minus  5/5 left hip flexor, knee extensor, ankle dorsiflexor  Patient with reduced sensation in right great toe, partial sensation small toe. Partial sensation. Ankle and knee compared to the left side.  Ambulates with a rolling walker and right AFO. There is hyperextension at both knees.      Assessment & Plan:  1.  Thoracic myelopathy with Incomplete  paraplegia, RLE weakness  Has persistent weakness Primarily right lower extremity. Does have some weakness on the left side. She has had a couple episodes of near buckling, but this is very short lived and once she is up for a little while. This passes. It could be some transient neurapraxia. Neuro exam on the left side is normal ,  although gait pattern suggests some quad weakness that is likely residual.  We discussed her medication management. She currently uses the methocarbamol about twice a day. She uses gabapentin 300 mg 3 times a day  She has enough for 3 months supply, which should cover her until she sees her primary care. Also, it is anticipated that over time she will not need either the same dose with the same frequency of these medications.  Physical medicine and rehabilitation follow-up on an as-needed basis Primary care follow-up in November Continues to follow with neurosurgery

## 2017-04-22 NOTE — Patient Instructions (Addendum)
Please ask her primary care physician to prescribe the methocarbamol or the gabapentin. If you need any other help with therapy related issues. Please call my office. Make sure to follow up with your neurosurgeon.  No driving until cleared by Neurosurgeon, Dr Cyndy Freeze

## 2017-04-27 ENCOUNTER — Ambulatory Visit: Payer: BLUE CROSS/BLUE SHIELD | Admitting: Physical Therapy

## 2017-04-27 DIAGNOSIS — M6281 Muscle weakness (generalized): Secondary | ICD-10-CM | POA: Diagnosis not present

## 2017-04-27 DIAGNOSIS — R2681 Unsteadiness on feet: Secondary | ICD-10-CM

## 2017-04-27 DIAGNOSIS — R2689 Other abnormalities of gait and mobility: Secondary | ICD-10-CM

## 2017-04-28 ENCOUNTER — Ambulatory Visit: Payer: BLUE CROSS/BLUE SHIELD | Admitting: Physical Therapy

## 2017-04-28 NOTE — Therapy (Signed)
[facility] [address] [place], [place], [address] Phone: [phone]   Fax:  [phone]  Physical Therapy Treatment  Patient Details  Name: Megan [name] MRN: [id number] Date of Birth: [birth year] Referring Provider: Letta Pate  Encounter Date: 04/27/2017      PT End of Session - 04/28/17 1234    Visit Number 11   Number of Visits 18   Date for PT Re-Evaluation 05/20/17   Authorization Type BC[employer]-30 visit limit, 19 visits remaining   Authorization - Visit Number 11   Authorization - Number of Visits 19   PT Start Time [phone]   PT Stop Time 1020   PT Time Calculation (min) 42 min   Equipment Utilized During Treatment Gait belt   Activity Tolerance Patient tolerated treatment well   Behavior During Therapy WFL for tasks assessed/performed      Past Medical History:  Diagnosis Date  . Arthritis   . Borderline diabetic   . GERD (gastroesophageal reflux disease)   . History of bronchitis 2005  . History of kidney stones   . History of shingles   . Hyperlipidemia    takes Fish Oil daily  . Neuromuscular disorder (HCC)    tingling toes  . 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[place]) 12/15/2016  . Pneumonia 2009  . PONV (postoperative nausea and vomiting)   . Restless leg   . Type 2 diabetes mellitus ([place])   . Weakness    numbness and tingling in both feet r/t back    Past Surgical History:  Procedure Laterality Date  . ABDOMINAL HYSTERECTOMY  1979  . APPLICATION OF ROBOTIC ASSISTANCE FOR SPINAL PROCEDURE  12/19/2016   Procedure: APPLICATION OF ROBOTIC ASSISTANCE FOR SPINAL PROCEDURE;  Surgeon: Kevan Ny Ditty, MD;  Location: [facility];  Service: Neurosurgery;;  . blADder tacked   1991  . CERVICAL FUSION     2001  . HERNIA REPAIR  1991  . LUMBAR FUSION  2016   L4-5  . LUMBAR FUSION  2017   L2-3  . TONSILLECTOMY AND ADENOIDECTOMY  1959  . TUMOR EXCISION     WERTHIN'S TUMORS BOTH SIDES OF NECK    There  were no vitals filed for this visit.      Subjective Assessment - 04/27/17 0942    Subjective Feel like the L knee is buckling more than usual and the L knee hasn't done it before.  When it happens I feel it come from my low back and weakness into my leg.  This is happening daily.  I have to have a death grip on the walker when this happens.   Patient is accompained by: Family member  spouse   Pertinent History  left T10 costotransversectomy, T10-11 laminectomy for decompression, dorsal internal fixation and fusion with bilateral pedicle screws T9-T12 12/19/2016 per Dr. Cyndy Freeze, then inpatient rehab, HHPT was discharge about 2 weeks ago.  PMH arthritis, GERD, L4-5 lumbar fusion 2016, L2-3 lumbar fusion 2017, cervical fusion 2001   Patient Stated Goals Pt's goals for therapy are to get R leg stronger than what it is now; perhaps be able to walk with a cane   Currently in Pain? No/denies                         [facility] Adult PT Treatment/Exercise - 04/28/17 1220      Transfers   Transfers Sit to Stand   Sit to Stand 6: Modified independent (Device/Increase time);With  upper extremity assist;From bed;From chair/3-in-1   Stand to Sit 6: Modified independent (Device/Increase time);With upper extremity assist;To bed;To chair/3-in-1   Number of Reps 10 reps;2 sets  from mat surface, then from 18" chair min UE support   Transfer Cueing Cues provided for equal weightbearing through bilateral lower extremities (to avoid excess increased LLE weightbearing)   Comments Pt does not complain of any weakness in LLE during PT session today.     Lumbar Exercises: Seated   Long Arc Quad on Chair AROM;Right;Left;1 set;10 reps   LAQ on Chair Limitations Progressed to alternating UE and LAQs   Hip Flexion on Ball AROM;Right;Left;10 reps  seated on edge of mat   Hip Flexion on Ball Limitations Progressed to alternating UE lifts with seated march; cues provided for abdominal activation and upright  trunk     Knee/Hip Exercises: Standing   Functional Squat 1 set;10 reps   Functional Squat Limitations At counter, review of HEP last visit-pt needs cues for technique to avoid knees going in front of toes   Other Standing Knee Exercises Step taps to 6" step, x 8 reps with RLE, cues to decreased R hip circumduction; x 10 reps on LLE, with RLE as stance with UE support     Knee/Hip Exercises: Seated   Heel Slides Strengthening;Right;1 set;5 reps  10 seconds with heel on floor   Other Seated Knee/Hip Exercises Seated heel digs at edge of mat, 15 reps for glut, quad/hamstring/anterior tib activation   Hamstring Curl Strengthening;Right;1 set;10 reps  with red theraband                  PT Short Term Goals - 04/28/17 1238      PT SHORT TERM GOAL #1   Title Pt will be independent with HEP for improved strength, balance, and gait.  TARGET 04/22/17   Time 5   Period Weeks   Status On-going     PT SHORT TERM GOAL #2   Title Pt will improve Berg score to at least 28/56 for decreased fall risk.   Baseline 04/18/17: 30/56 scored today   Status Achieved     PT SHORT TERM GOAL #3   Title Pt will improve TUG score to less than or equal to 19 seconds for decreased fall risk.   Baseline 04/18/17: 16.69 sec's with rollator   Status Achieved     PT SHORT TERM GOAL #4   Title Pt will perform at least 8 of 10 reps of sit<>stand transfers with minimal UE support, modified independently and no LOB, for improved safety and efficiency of transfers.   Time 5   Period Weeks   Status Achieved     PT SHORT TERM GOAL #5   Title Pt will ambulate at least 300 ft using rollator walker, supervision , for improved gait efficiency and safety.   Baseline 04/18/17: met today   Status Achieved           PT Long Term Goals - 03/21/17 1533      PT LONG TERM GOAL #1   Title Pt will verbalize understanding of fall prevention in the home environment.  TARGET 05/20/17   Time 9   Period Weeks   Status  New     PT LONG TERM GOAL #2   Title Pt will improve Berg Balance score to at least 35/56 for decreased fall risk.   Time 9   Period Weeks   Status New     PT LONG TERM  GOAL #3   Title Pt will improve TUG score to less than or equal to 15 seconds for decreased fall risk.   Time 5   Period Weeks   Status New     PT LONG TERM GOAL #4   Title Pt will improve gait velocity to at least 2 ft/sec for decreased fall risk.   Time 9   Period Weeks   Status New     PT LONG TERM GOAL #5   Title Pt will ambulate at least 50-75 ft using cane, with supervision, for improved independence/gait safety in home.   Time 9   Period Weeks   Status New               Plan - 04/28/17 1235    Clinical Impression Statement Pt has met STG for sit<>stand transfers.  Pt is making progress with functional objective measures, but she continues to demo decreased RLE strength.  She also reports occasions where L knee wants to give way (usually with sit<>Stand).  Reviewed sit<>stand to make sure she is not excessively weightshfiting to [place]side, needing cues for equal weightbearing.  She does report consistently noting improved sensation in R hamstrings.  Pt will continue to benefit from skilled PT to work towards [place].   Rehab Potential Good   Clinical Impairments Affecting Rehab Potential severity of deficits; pt is motivated for therapy    PT Frequency 2x / week   PT Duration 8 weeks   PT Treatment/Interventions ADLs/Self Care Home Management;Gait training;DME Instruction;Functional mobility training;Therapeutic activities;Therapeutic exercise;Balance training;Neuromuscular re-education;Patient/family education;Orthotic Fit/Training   PT Next Visit Plan RLE strengthening, cues for abdominal activation with seated core and leg activities; gait training and balance   Consulted and Agree with Plan of Care Patient;Family member/caregiver   Family Member Consulted Husband      Patient will benefit from  skilled therapeutic intervention in order to improve the following deficits and impairments:  Abnormal gait, Decreased balance, Decreased strength, Difficulty walking, Impaired sensation, Postural dysfunction  Visit Diagnosis: Muscle weakness (generalized)  Unsteadiness on feet  Other abnormalities of gait and mobility     Problem List Patient Active Problem List   Diagnosis Date Noted  . Sensation of pressure in bladder area 01/31/2017  . Paraplegia, incomplete ([place]) 01/07/2017  . Neurogenic bladder 12/27/2016  . 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/27/2016  . Myelopathy ([place]) 12/21/2016  . Surgery, elective   . Diabetes mellitus type 2 in nonobese (HCC)   . History of lumbar fusion   . History of fusion of cervical spine   . Neuropathic pain   . Post-operative pain   . 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 Paraparesis ([place]) 12/15/2016  . Pedal edema 12/14/2016  . Type 2 diabetes mellitus without complication, without long-term current use of insulin ([place]) 11/02/2016  . Hyperlipidemia 11/02/2016  . Essential hypertension 11/02/2016  . 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    Jimmi Sidener W. 04/28/2017, 12:39 PM  Frazier Butt., PT   [facility] [address] [place], [place], [address] Phone: [phone]   Fax:  [phone]  Name: Megan [name] MRN: [id number] Date of Birth: May 20, 1951

## 2017-04-29 ENCOUNTER — Ambulatory Visit: Payer: BLUE CROSS/BLUE SHIELD | Admitting: Physical Therapy

## 2017-04-29 DIAGNOSIS — M6281 Muscle weakness (generalized): Secondary | ICD-10-CM | POA: Diagnosis not present

## 2017-04-29 DIAGNOSIS — R2689 Other abnormalities of gait and mobility: Secondary | ICD-10-CM

## 2017-04-29 NOTE — Patient Instructions (Addendum)
ANKLE: Eversion, Unilateral    Sit at edge of surface, feet on floor. Raise toes of foot up and move away from body. Do not move hip or knee. ___ reps per set, ___ sets per day, ___ days per week  Copyright  VHI. All rights reserved.    Seated Heel digs   -Sit at the edge of the mat or chair, without your brace on.  - Sit with tall posture. -Dig in both heels into the floor (you should feel yourself sit up taller, with your buttocks, hamstrings, and calf muscles activating).  Hold for 3 seconds then relax -repeat 10 times, 2-3 sets

## 2017-04-30 NOTE — Therapy (Signed)
[facility] [address] [place], [place], [address] Phone: [phone]   Fax:  [phone]  Physical Therapy Treatment  Patient Details  Name: Megan [name] MRN: [id number] Date of Birth: 04/16/1951 Referring Provider: Letta Pate  Encounter Date: 04/29/2017      PT End of Session - 04/30/17 1658    Visit Number 12   Number of Visits 18   Date for PT Re-Evaluation 05/20/17   Authorization Type BCBS-30 visit limit, 19 visits remaining   Authorization - Visit Number 12   Authorization - Number of Visits 19   PT Start Time 1147   PT Stop Time 1230   PT Time Calculation (min) 43 min   Equipment Utilized During Treatment Gait belt   Activity Tolerance Patient tolerated treatment well   Behavior During Therapy WFL for tasks assessed/performed      Past Medical History:  Diagnosis Date  . Arthritis   . Borderline diabetic   . GERD (gastroesophageal reflux disease)   . History of bronchitis 2005  . History of kidney stones   . History of shingles   . Hyperlipidemia    takes Fish Oil daily  . Neuromuscular disorder (HCC)    tingling toes  . 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[place]) 12/15/2016  . Pneumonia 2009  . PONV (postoperative nausea and vomiting)   . Restless leg   . Type 2 diabetes mellitus ([place])   . Weakness    numbness and tingling in both feet r/t back    Past Surgical History:  Procedure Laterality Date  . ABDOMINAL HYSTERECTOMY  1979  . APPLICATION OF ROBOTIC ASSISTANCE FOR SPINAL PROCEDURE  12/19/2016   Procedure: APPLICATION OF ROBOTIC ASSISTANCE FOR SPINAL PROCEDURE;  Surgeon: Kevan Ny Ditty, MD;  Location: [facility];  Service: Neurosurgery;;  . blADder tacked   1991  . CERVICAL FUSION     2001  . HERNIA REPAIR  1991  . LUMBAR FUSION  2016   L4-5  . LUMBAR FUSION  2017   L2-3  . TONSILLECTOMY AND ADENOIDECTOMY  1959  . TUMOR EXCISION     WERTHIN'S TUMORS BOTH SIDES OF NECK    There  were no vitals filed for this visit.      Subjective Assessment - 04/30/17 1638    Subjective The L knee buckling is happening about the same amount, still sometimes when I first stand up.  Wonder if I could try to walk around home at all with no shoes/no brace.  I'm afraid to due to possibility of R foot rolling in.   Patient is accompained by: Family member  spouse   Pertinent History  left T10 costotransversectomy, T10-11 laminectomy for decompression, dorsal internal fixation and fusion with bilateral pedicle screws T9-T12 12/19/2016 per Dr. Cyndy Freeze, then inpatient rehab, HHPT was discharge about 2 weeks ago.  PMH arthritis, GERD, L4-5 lumbar fusion 2016, L2-3 lumbar fusion 2017, cervical fusion 2001   Patient Stated Goals Pt's goals for therapy are to get R leg stronger than what it is now; perhaps be able to walk with a cane   Currently in Pain? No/denies                         [facility] Adult PT Treatment/Exercise - 04/30/17 1640      Ambulation/Gait   Ambulation/Gait Yes   Ambulation/Gait Assistance 5: Supervision;4: Min guard   Ambulation/Gait Assistance Details Trial of gait x 20 ft using rollator  walker, wearing no AFO, no shoes (per patient request, as she has to do this when getting out of the shower).  Pt demonstrates ability to slowly perform heelstrike, but then foot goes into increased inversion and supination.  Pt able to place it foot flat to prevent rolling.   Pre-Gait Activities Discussed with patient that ambulating at home without shoes and without AFO is not safe at this time due to risk of falls, risk of injury to R ankle.     Gait Comments Gait trial x 120 ft no AFO wearing shoes, with rollator walker.  Pt has good heelstrike and foot flat placement for the most part, but does has 2 episodes of R toe catching the floor.  PT recommends use of shoe and AFO with gait at home.     Knee/Hip Exercises: Seated   Heel Slides Strengthening;Right;1 set;5 reps   10second hold with heel on floor   Other Seated Knee/Hip Exercises Seated heel digs at edge of mat, 15 reps for glut, quad/hamstring/anterior tib activation   Hamstring Curl Strengthening;Right;1 set;10 reps  red     Knee/Hip Exercises: Supine   Other Supine Knee/Hip Exercises Hooklying marching x 10 reps, with tactile cues for R foot placement     Knee/Hip Exercises: Sidelying   Other Sidelying Knee/Hip Exercises With RLE on powder board:  hip flexion and extension 10 reps x 2 sets; knee extension and flexion x 10 reps, 2 sets (1 set 2#); A/AROM ankle plantarflexion and dorsiflexion x 10 reps     Ankle Exercises: Seated   Other Seated Ankle Exercises R ankle eversion 2 sets x 5 reps, with cues to come back to mid-line to avoid excess inversion.  Attempted towel eversion, but patient unable.  Pt needs cues to keep knee/hip in neutral position.                PT Education - 04/30/17 1656    Education provided Yes   Education Details ankle exercises added to HEP; discussion regarding need for patient to wear AFO and shoe with ambulation in home; pt would like to PT to follow up with Dr. Cyndy Freeze about L knee buckling, with pain/weakness in L hip area at times (new since last neurosurgeon visit)   Person(s) Educated Patient   Methods Explanation;Demonstration;Handout   Comprehension Verbalized understanding;Returned demonstration          PT Short Term Goals - 04/28/17 1238      PT SHORT TERM GOAL #1   Title Pt will be independent with HEP for improved strength, balance, and gait.  TARGET 04/22/17   Time 5   Period Weeks   Status On-going     PT SHORT TERM GOAL #2   Title Pt will improve Berg score to at least 28/56 for decreased fall risk.   Baseline 04/18/17: 30/56 scored today   Status Achieved     PT SHORT TERM GOAL #3   Title Pt will improve TUG score to less than or equal to 19 seconds for decreased fall risk.   Baseline 04/18/17: 16.69 sec's with rollator   Status  Achieved     PT SHORT TERM GOAL #4   Title Pt will perform at least 8 of 10 reps of sit<>stand transfers with minimal UE support, modified independently and no LOB, for improved safety and efficiency of transfers.   Time 5   Period Weeks   Status Achieved     PT SHORT TERM GOAL #5   Title Pt  will ambulate at least 300 ft using rollator walker, supervision , for improved gait efficiency and safety.   Baseline 04/18/17: met today   Status Achieved           PT Long Term Goals - 03/21/17 1533      PT LONG TERM GOAL #1   Title Pt will verbalize understanding of fall prevention in the home environment.  TARGET 05/20/17   Time 9   Period Weeks   Status New     PT LONG TERM GOAL #2   Title Pt will improve Berg Balance score to at least 35/56 for decreased fall risk.   Time 9   Period Weeks   Status New     PT LONG TERM GOAL #3   Title Pt will improve TUG score to less than or equal to 15 seconds for decreased fall risk.   Time 5   Period Weeks   Status New     PT LONG TERM GOAL #4   Title Pt will improve gait velocity to at least 2 ft/sec for decreased fall risk.   Time 9   Period Weeks   Status New     PT LONG TERM GOAL #5   Title Pt will ambulate at least 50-75 ft using cane, with supervision, for improved independence/gait safety in home.   Time 9   Period Weeks   Status New      --PT phoned Dr. Hewitt Shorts office and spoke with Renford Dills, who states from previous visit, pt does not have any restrictions per Dr. Cyndy Freeze.  PT let her know of pt's c/o over the past several weeks, that L knee buckles at times upon standing, noting a weakness that comes from L posterior hip area.  PT let her know pt reports this is new since the last visit to Dr. Hewitt Shorts office.  Renford Dills states she will let Dr. Cyndy Freeze know and they will contact patient to follow-up.         Plan - 04/30/17 1659    Clinical Impression Statement Focused PT session today on RLE strengthening as well as  addressing pt's question regarding possibility of walking short distances in home without shoe or without brace.  While patient does report improved sensation in R hamstrings and improved strength in isolation with exercises, pt continues to exhibit R knee recurvatum and ankle weakness.  Pt is very motivated with exercises and gait at home, and pt is concerned about episodes of L knee buckling at times upon standing.  See notes that pt contacted Dr. Cyndy Freeze.   Rehab Potential Good   Clinical Impairments Affecting Rehab Potential severity of deficits; pt is motivated for therapy    PT Frequency 2x / week   PT Duration 8 weeks   PT Treatment/Interventions ADLs/Self Care Home Management;Gait training;DME Instruction;Functional mobility training;Therapeutic activities;Therapeutic exercise;Balance training;Neuromuscular re-education;Patient/family education;Orthotic Fit/Training   PT Next Visit Plan Continue RLE strengthening; review HEP, cues for abdominal activation with seated core and leg activities; gait training and balance   Consulted and Agree with Plan of Care Patient;Family member/caregiver   Family Member Consulted Husband      Patient will benefit from skilled therapeutic intervention in order to improve the following deficits and impairments:  Abnormal gait, Decreased balance, Decreased strength, Difficulty walking, Impaired sensation, Postural dysfunction  Visit Diagnosis: Muscle weakness (generalized)  Other abnormalities of gait and mobility     Problem List Patient Active Problem List   Diagnosis Date Noted  . Sensation of pressure  in bladder area 01/31/2017  . Paraplegia, incomplete ([place]) 01/07/2017  . Neurogenic bladder 12/27/2016  . 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/27/2016  . Myelopathy ([place]) 12/21/2016  . Surgery, elective   . Diabetes mellitus type 2 in nonobese (HCC)   . History of lumbar fusion   . History of fusion of cervical spine   . Neuropathic pain   . Post-operative pain    . 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 Paraparesis ([place]) 12/15/2016  . Pedal edema 12/14/2016  . Type 2 diabetes mellitus without complication, without long-term current use of insulin ([place]) 11/02/2016  . Hyperlipidemia 11/02/2016  . Essential hypertension 11/02/2016  . 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    Glynn Freas W. 04/30/2017, 5:04 PM  Frazier Butt., PT   [facility] [address] [place], [place], [address] Phone: [phone]   Fax:  [phone]  Name: GIOIA RANES MRN: [id number] Date of Birth: [birth year]

## 2017-05-03 ENCOUNTER — Ambulatory Visit: Payer: BLUE CROSS/BLUE SHIELD | Admitting: Physical Therapy

## 2017-05-03 ENCOUNTER — Encounter: Payer: Self-pay | Admitting: Physical Therapy

## 2017-05-03 DIAGNOSIS — M6281 Muscle weakness (generalized): Secondary | ICD-10-CM

## 2017-05-03 DIAGNOSIS — R2681 Unsteadiness on feet: Secondary | ICD-10-CM

## 2017-05-03 DIAGNOSIS — R2689 Other abnormalities of gait and mobility: Secondary | ICD-10-CM

## 2017-05-05 NOTE — Therapy (Signed)
[facility] [address] [place], [place], [address] Phone: [phone]   Fax:  [phone]  Physical Therapy Treatment  Patient Details  Name: Megan [name] MRN: [id number] Date of Birth: [birth year] Referring Provider: Letta Pate  Encounter Date: 05/03/2017   05/03/17 1538  PT Visits / Re-Eval  Visit Number 13  Number of Visits 18  Date for PT Re-Evaluation 05/20/17  Authorization  Authorization Type BCBS-30 visit limit, 19 visits remaining  Authorization - Visit Number 64  Authorization - Number of Visits 19  PT Time Calculation  PT Start Time 1535  PT Stop Time 1615  PT Time Calculation (min) 40 min  PT - End of Session  Equipment Utilized During Treatment Gait belt  Activity Tolerance Patient tolerated treatment well  Behavior During Therapy [facility] for tasks assessed/performed     Past Medical History:  Diagnosis Date  . Arthritis   . Borderline diabetic   . GERD (gastroesophageal reflux disease)   . History of bronchitis 2005  . History of kidney stones   . History of shingles   . Hyperlipidemia    takes Fish Oil daily  . Neuromuscular disorder (HCC)    tingling toes  . 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[place]) 12/15/2016  . Pneumonia 2009  . PONV (postoperative nausea and vomiting)   . Restless leg   . Type 2 diabetes mellitus ([employer])   . Weakness    numbness and tingling in both feet r/t back    Past Surgical History:  Procedure Laterality Date  . ABDOMINAL HYSTERECTOMY  1979  . APPLICATION OF ROBOTIC ASSISTANCE FOR SPINAL PROCEDURE  12/19/2016   Procedure: APPLICATION OF ROBOTIC ASSISTANCE FOR SPINAL PROCEDURE;  Surgeon: Kevan Ny Ditty, MD;  Location: [facility];  Service: Neurosurgery;;  . blADder tacked   1991  . CERVICAL FUSION     2001  . HERNIA REPAIR  1991  . LUMBAR FUSION  2016   L4-5  . LUMBAR FUSION  2017   L2-3  . TONSILLECTOMY AND ADENOIDECTOMY  1959  . TUMOR EXCISION      WERTHIN'S TUMORS BOTH SIDES OF NECK    There were no vitals filed for this visit.   05/03/17 1537  Symptoms/Limitations  Subjective No new complaitns. No falls or pain to report. Has appt with Dr. Hewitt Shorts PA tomorrow at 2pm to have left knee checked out.   Patient is accompained by: Family member  Pertinent History left T10 costotransversectomy, T10-11 laminectomy for decompression, dorsal internal fixation and fusion with bilateral pedicle screws T9-T12 12/19/2016 per Dr. Cyndy Freeze, then inpatient rehab, HHPT was discharge about 2 weeks ago.  PMH arthritis, GERD, L4-5 lumbar fusion 2016, L2-3 lumbar fusion 2017, cervical fusion 2001  Patient Stated Goals Pt's goals for therapy are to get R leg stronger than what it is now; perhaps be able to walk with a cane  Pain Assessment  Currently in Pain? No/denies  Pain Score 0       05/03/17 1539  Transfers  Transfers Sit to Stand;Stand to Sit  Number of Reps 10 reps;1 set  Transfer Cueing cues for hand placement with standing only, no hands with sitting down. cues for equal weight bearing with sit/stands  Exercises  Other Exercises  seated on blue pball: bouncing for 1 minute with emphasis on tall posture, pelvic rocks left<>right and ant<>post, alternaing UE raises, alternating left lifts with limited/decreased range on right side. min asist for balance with intermittent UE support on mat table,  cues on abdominal bracing  and ex form provided. attemtped long arc quads, pt unable to lift right foot up.   Lumbar Exercises: Seated  Long Arc Quad on Chair AROM;Strengthening;Right;2 sets;10 reps;Limitations;Weights  LAQ on Chair Limitations cues to decrease compensatory motions and for full knee extension  LAQ on Chair Weights (lbs) 2  Knee/Hip Exercises: Standing  Forward Step Up Right;1 set;10 reps;Hand Hold: 2;Step Height: 6";Limitations  Step Down Right;1 set;10 reps;Hand Hold: 2;Step Height: 6";Limitations  Functional Squat 1 set;10 reps;5  seconds  Functional Squat Limitations with UE support: 5 sec holds each rep.  Heel Raises Both;1 set;10 reps;5 seconds;Limitations  Wall Squat 1 set;15 reps;Limitations  Wall Squat Limitations with seat under pt in case of knee/s buckling, no holds this session. cues on form and tecnhnique  Heel Raises Limitations UE support:  cues for knee extension and posture  Forward Step Up Limitations cues on form and technique with UE support on rails  Step Down Limitations cues on form and technique with UE support          PT Short Term Goals - 04/28/17 1238      PT SHORT TERM GOAL #1   Title Pt will be independent with HEP for improved strength, balance, and gait.  TARGET 04/22/17   Time 5   Period Weeks   Status On-going     PT SHORT TERM GOAL #2   Title Pt will improve Berg score to at least 28/56 for decreased fall risk.   Baseline 04/18/17: 30/56 scored today   Status Achieved     PT SHORT TERM GOAL #3   Title Pt will improve TUG score to less than or equal to 19 seconds for decreased fall risk.   Baseline 04/18/17: 16.69 sec's with rollator   Status Achieved     PT SHORT TERM GOAL #4   Title Pt will perform at least 8 of 10 reps of sit<>stand transfers with minimal UE support, modified independently and no LOB, for improved safety and efficiency of transfers.   Time 5   Period Weeks   Status Achieved     PT SHORT TERM GOAL #5   Title Pt will ambulate at least 300 ft using rollator walker, supervision , for improved gait efficiency and safety.   Baseline 04/18/17: met today   Status Achieved           PT Long Term Goals - 03/21/17 1533      PT LONG TERM GOAL #1   Title Pt will verbalize understanding of fall prevention in the home environment.  TARGET 05/20/17   Time 9   Period Weeks   Status New     PT LONG TERM GOAL #2   Title Pt will improve Berg Balance score to at least 35/56 for decreased fall risk.   Time 9   Period Weeks   Status New     PT LONG TERM GOAL #3    Title Pt will improve TUG score to less than or equal to 15 seconds for decreased fall risk.   Time 5   Period Weeks   Status New     PT LONG TERM GOAL #4   Title Pt will improve gait velocity to at least 2 ft/sec for decreased fall risk.   Time 9   Period Weeks   Status New     PT LONG TERM GOAL #5   Title Pt will ambulate at least 50-75 ft using cane, with supervision, for improved  independence/gait safety in home.   Time 9   Period Weeks   Status New         05/03/17 1538  Plan  Clinical Impression Statement Today's skilled session continued to focus on LE and core strengthening without any issues reported. Pt is progressing toward goals and should benefit from continued PT to progress toward unmet goals.   Pt will benefit from skilled therapeutic intervention in order to improve on the following deficits Abnormal gait;Decreased balance;Decreased strength;Difficulty walking;Impaired sensation;Postural dysfunction  Rehab Potential Good  Clinical Impairments Affecting Rehab Potential severity of deficits; pt is motivated for therapy   PT Frequency 2x / week  PT Duration 8 weeks  PT Treatment/Interventions ADLs/Self Care Home Management;Gait training;DME Instruction;Functional mobility training;Therapeutic activities;Therapeutic exercise;Balance training;Neuromuscular re-education;Patient/family education;Orthotic Fit/Training  PT Next Visit Plan Continue RLE strengthening; review HEP, cues for abdominal activation with seated core and leg activities; gait training and balance  Consulted and Agree with Plan of Care Patient;Family member/caregiver  Family Member Consulted Husband         Patient will benefit from skilled therapeutic intervention in order to improve the following deficits and impairments:  Abnormal gait, Decreased balance, Decreased strength, Difficulty walking, Impaired sensation, Postural dysfunction  Visit Diagnosis: Muscle weakness  (generalized)  Other abnormalities of gait and mobility  Unsteadiness on feet     Problem List Patient Active Problem List   Diagnosis Date Noted  . Sensation of pressure in bladder area 01/31/2017  . Paraplegia, incomplete ([place]) 01/07/2017  . Neurogenic bladder 12/27/2016  . 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/27/2016  . Myelopathy ([place]) 12/21/2016  . Surgery, elective   . Diabetes mellitus type 2 in nonobese (HCC)   . History of lumbar fusion   . History of fusion of cervical spine   . Neuropathic pain   . Post-operative pain   . 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 Paraparesis ([place]) 12/15/2016  . Pedal edema 12/14/2016  . Type 2 diabetes mellitus without complication, without long-term current use of insulin ([place]) 11/02/2016  . Hyperlipidemia 11/02/2016  . Essential hypertension 11/02/2016  . 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   [name], PTA, [facility] [address] [place], [place] [address] [phone] 05/05/17, 9:16 AM  Name: Megan [name] MRN: [id number] Date of Birth: July 18, 1951

## 2017-05-06 ENCOUNTER — Encounter: Payer: Self-pay | Admitting: Physical Therapy

## 2017-05-06 ENCOUNTER — Ambulatory Visit: Payer: BLUE CROSS/BLUE SHIELD | Admitting: Physical Therapy

## 2017-05-06 DIAGNOSIS — R2681 Unsteadiness on feet: Secondary | ICD-10-CM

## 2017-05-06 DIAGNOSIS — R2689 Other abnormalities of gait and mobility: Secondary | ICD-10-CM

## 2017-05-06 DIAGNOSIS — M6281 Muscle weakness (generalized): Secondary | ICD-10-CM

## 2017-05-06 NOTE — Patient Instructions (Addendum)
Using dining room table with spouse assistance: walk 2-3 laps around table with right hand on table and cane in left hand. Decrease the amount of support the right arm is providing as you are able too. Only do this when the left leg is having a "good day" and with your husbands help for safety.  At kitchen counter or dining room table with spouse's hand holding the other side (or cane with spouse holding/near you):   "I love a Parade" Lift   At counter for balance as needed: high knee marching forward and then backward. 3 second pauses with each knee lift.  Repeat 3 laps each way. Do 1 time a day 3-5 days a week.  http://gt2.exer.us/345   Copyright  VHI. All rights reserved.   Feet Heel-Toe "Tandem"    walk a straight line forward by bringing one foot directly in front of the other and then a straight line backwards by bringing one foot directly behind the other one.  Repeat for 3 laps each way. Do 1 time 3-5 days a week.  Copyright  VHI. All rights reserved.

## 2017-05-07 NOTE — Therapy (Signed)
[facility] [address] [place], [place], [address] Phone: [phone]   Fax:  [phone]  Physical Therapy Treatment  Patient Details  Name: Megan [name] MRN: [id number] Date of Birth: 1951/08/15 Referring Provider: Letta Pate  Encounter Date: 05/06/2017      PT End of Session - 05/06/17 1538    Visit Number 14   Number of Visits 18   Date for PT Re-Evaluation 05/20/17   Authorization Type BCBS-30 visit limit, 19 visits remaining   Authorization - Visit Number 14   Authorization - Number of Visits 19   PT Start Time 6503   PT Stop Time 1620   PT Time Calculation (min) 46 min   Equipment Utilized During Treatment Gait belt   Activity Tolerance Patient tolerated treatment well   Behavior During Therapy WFL for tasks assessed/performed      Past Medical History:  Diagnosis Date  . Arthritis   . Borderline diabetic   . GERD (gastroesophageal reflux disease)   . History of bronchitis 2005  . History of kidney stones   . History of shingles   . Hyperlipidemia    takes Fish Oil daily  . Neuromuscular disorder (HCC)    tingling toes  . 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[place]) 12/15/2016  . Pneumonia 2009  . PONV (postoperative nausea and vomiting)   . Restless leg   . Type 2 diabetes mellitus ([place])   . Weakness    numbness and tingling in both feet r/t back    Past Surgical History:  Procedure Laterality Date  . ABDOMINAL HYSTERECTOMY  1979  . APPLICATION OF ROBOTIC ASSISTANCE FOR SPINAL PROCEDURE  12/19/2016   Procedure: APPLICATION OF ROBOTIC ASSISTANCE FOR SPINAL PROCEDURE;  Surgeon: Kevan Ny Ditty, MD;  Location: [facility];  Service: Neurosurgery;;  . blADder tacked   1991  . CERVICAL FUSION     2001  . HERNIA REPAIR  1991  . LUMBAR FUSION  2016   L4-5  . LUMBAR FUSION  2017   L2-3  . TONSILLECTOMY AND ADENOIDECTOMY  1959  . TUMOR EXCISION     WERTHIN'S TUMORS BOTH SIDES OF NECK    There  were no vitals filed for this visit.      Subjective Assessment - 05/06/17 1536    Subjective Saw Dr. Hewitt Shorts PA who looked at left knee. He feels it's a pulled muscle which he stated could take up to 6-8 weeks to get better. He wants her to follow up in a few weeks if not better and return if it gets worse. No pain now or falls to report.      Patient is accompained by: Family member   Pertinent History  left T10 costotransversectomy, T10-11 laminectomy for decompression, dorsal internal fixation and fusion with bilateral pedicle screws T9-T12 12/19/2016 per Dr. Cyndy Freeze, then inpatient rehab, HHPT was discharge about 2 weeks ago.  PMH arthritis, GERD, L4-5 lumbar fusion 2016, L2-3 lumbar fusion 2017, cervical fusion 2001   Patient Stated Goals Pt's goals for therapy are to get R leg stronger than what it is now; perhaps be able to walk with a cane   Currently in Pain? No/denies   Pain Score 0-No pain            [facility] Adult PT Treatment/Exercise - 05/06/17 1539      Transfers   Transfers Sit to Stand;Stand to Sit   Sit to Stand 6: Modified independent (Device/Increase time);With upper extremity assist;From bed;From chair/3-in-1  Stand to Sit 6: Modified independent (Device/Increase time);With upper extremity assist;To bed;To chair/3-in-1     Ambulation/Gait   Ambulation/Gait Yes   Ambulation/Gait Assistance 5: Supervision;4: Min guard   Ambulation/Gait Assistance Details used rollator around gym with activities with supervision. used straight cane with rubber tip to walk 2 laps around table. cues on posture and sequencing with cane with pt demo'ing step to gait pattern.    Ambulation Distance (Feet) --  around gym with rollator, 2 laps around table with cane   Assistive device Rollator;Straight cane  straight cane with rubber quad tip   Gait Pattern Step-through pattern;Decreased step length - right;Decreased stance time - right;Right genu recurvatum;Trunk flexed;Poor foot clearance -  right;Decreased hip/knee flexion - right;Step-to pattern   Ambulation Surface Level;Indoor     High Level Balance   High Level Balance Activities Marching forwards;Marching backwards;Tandem walking  tandem fwd/bwd   High Level Balance Comments in parallel bars with both bars x1 lap, then single bar/HHA on other side to assess if safe for home with spouse assistance at kitchen counter/dining room table.              Balance Exercises - 05/06/17 1557      Balance Exercises: Standing   Rockerboard Anterior/posterior;Lateral;Head turns;EO;EC;30 seconds;10 reps;Intermittent UE support     Balance Exercises: Standing   Rebounder Limitations both ways on balance board: EO rocking board with emphasis on tall posture; holding board steady- alternating UE raises, bil UE raises,  and head movements left<>right and up<>down x 10 reps each with cues on form/weight shifting/posture, progressing to EC no head movements 30 sec's x 3 reps. min guard to min assist for balance with all activities.             PT Education - 05/06/17 1435    Education provided Yes   Education Details working with cane at dining room table with spouse assistance as left knee/leg allows; added 2 new dynamic balance ex's for home with spouse assistance   Person(s) Educated Patient;Spouse   Methods Explanation;Demonstration;Verbal cues;Handout   Comprehension Verbalized understanding;Returned demonstration;Verbal cues required;Need further instruction          PT Short Term Goals - 04/28/17 1238      PT SHORT TERM GOAL #1   Title Pt will be independent with HEP for improved strength, balance, and gait.  TARGET 04/22/17   Time 5   Period Weeks   Status On-going     PT SHORT TERM GOAL #2   Title Pt will improve Berg score to at least 28/56 for decreased fall risk.   Baseline 04/18/17: 30/56 scored today   Status Achieved     PT SHORT TERM GOAL #3   Title Pt will improve TUG score to less than or equal to 19  seconds for decreased fall risk.   Baseline 04/18/17: 16.69 sec's with rollator   Status Achieved     PT SHORT TERM GOAL #4   Title Pt will perform at least 8 of 10 reps of sit<>stand transfers with minimal UE support, modified independently and no LOB, for improved safety and efficiency of transfers.   Time 5   Period Weeks   Status Achieved     PT SHORT TERM GOAL #5   Title Pt will ambulate at least 300 ft using rollator walker, supervision , for improved gait efficiency and safety.   Baseline 04/18/17: met today   Status Achieved           PT  Long Term Goals - 03/21/17 1533      PT LONG TERM GOAL #1   Title Pt will verbalize understanding of fall prevention in the home environment.  TARGET 05/20/17   Time 9   Period Weeks   Status New     PT LONG TERM GOAL #2   Title Pt will improve Berg Balance score to at least 35/56 for decreased fall risk.   Time 9   Period Weeks   Status New     PT LONG TERM GOAL #3   Title Pt will improve TUG score to less than or equal to 15 seconds for decreased fall risk.   Time 5   Period Weeks   Status New     PT LONG TERM GOAL #4   Title Pt will improve gait velocity to at least 2 ft/sec for decreased fall risk.   Time 9   Period Weeks   Status New     PT LONG TERM GOAL #5   Title Pt will ambulate at least 50-75 ft using cane, with supervision, for improved independence/gait safety in home.   Time 9   Period Weeks   Status New            Plan - 05/06/17 1539    Clinical Impression Statement Today's skilled session continued to focus on gait with LRAD and balance. Pt is making steady progress toward goals and should benefit from continued PT to progress toward unmet goals.    Rehab Potential Good   Clinical Impairments Affecting Rehab Potential severity of deficits; pt is motivated for therapy    PT Frequency 2x / week   PT Duration 8 weeks   PT Treatment/Interventions ADLs/Self Care Home Management;Gait training;DME  Instruction;Functional mobility training;Therapeutic activities;Therapeutic exercise;Balance training;Neuromuscular re-education;Patient/family education;Orthotic Fit/Training   PT Next Visit Plan Continue RLE strengthening; review HEP, cues for abdominal activation with seated core and leg activities; gait training and balance   Consulted and Agree with Plan of Care Patient;Family member/caregiver   Family Member Consulted Husband      Patient will benefit from skilled therapeutic intervention in order to improve the following deficits and impairments:  Abnormal gait, Decreased balance, Decreased strength, Difficulty walking, Impaired sensation, Postural dysfunction  Visit Diagnosis: Muscle weakness (generalized)  Other abnormalities of gait and mobility  Unsteadiness on feet     Problem List Patient Active Problem List   Diagnosis Date Noted  . Sensation of pressure in bladder area 01/31/2017  . Paraplegia, incomplete ([place]) 01/07/2017  . Neurogenic bladder 12/27/2016  . 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/27/2016  . Myelopathy ([facility] [facility]) 12/21/2016  . Surgery, elective   . Diabetes mellitus type 2 in nonobese (HCC)   . History of lumbar fusion   . History of fusion of cervical spine   . Neuropathic pain   . Post-operative pain   . 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 Paraparesis ([place]) 12/15/2016  . Pedal edema 12/14/2016  . Type 2 diabetes mellitus without complication, without long-term current use of insulin ([place]) 11/02/2016  . Hyperlipidemia 11/02/2016  . Essential hypertension 11/02/2016  . 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    [name], PTA, [facility] [address], [place], [place] [address] [phone] 05/07/17, 2:37 PM   Name: JENIFER STRUVE MRN: [id number] Date of Birth: [birth year]

## 2017-05-10 ENCOUNTER — Ambulatory Visit: Payer: BLUE CROSS/BLUE SHIELD | Admitting: Physical Therapy

## 2017-05-10 ENCOUNTER — Encounter: Payer: Self-pay | Admitting: Physical Therapy

## 2017-05-10 DIAGNOSIS — R2681 Unsteadiness on feet: Secondary | ICD-10-CM

## 2017-05-10 DIAGNOSIS — M6281 Muscle weakness (generalized): Secondary | ICD-10-CM

## 2017-05-10 DIAGNOSIS — R2689 Other abnormalities of gait and mobility: Secondary | ICD-10-CM

## 2017-05-10 NOTE — Therapy (Signed)
[facility] [address] [place], [place], [address] Phone: [phone]   Fax:  [phone]  Physical Therapy Treatment  Patient Details  Name: Megan [name] MRN: [id number] Date of Birth: Jul 16, 1951 Referring Provider: Letta Pate  Encounter Date: 05/10/2017      PT End of Session - 05/10/17 1455    Visit Number 15   Number of Visits 18   Date for PT Re-Evaluation 05/20/17   Authorization Type BCBS-30 visit limit, 19 visits remaining   Authorization - Visit Number 15   Authorization - Number of Visits 19   PT Start Time 6789   PT Stop Time 1530   PT Time Calculation (min) 41 min   Equipment Utilized During Treatment Gait belt   Activity Tolerance Patient tolerated treatment well   Behavior During Therapy WFL for tasks assessed/performed      Past Medical History:  Diagnosis Date  . Arthritis   . Borderline diabetic   . GERD (gastroesophageal reflux disease)   . History of bronchitis 2005  . History of kidney stones   . History of shingles   . Hyperlipidemia    takes Fish Oil daily  . Neuromuscular disorder (HCC)    tingling toes  . 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[place]) 12/15/2016  . Pneumonia 2009  . PONV (postoperative nausea and vomiting)   . Restless leg   . Type 2 diabetes mellitus ([place])   . Weakness    numbness and tingling in both feet r/t back    Past Surgical History:  Procedure Laterality Date  . ABDOMINAL HYSTERECTOMY  1979  . APPLICATION OF ROBOTIC ASSISTANCE FOR SPINAL PROCEDURE  12/19/2016   Procedure: APPLICATION OF ROBOTIC ASSISTANCE FOR SPINAL PROCEDURE;  Surgeon: Kevan Ny Ditty, MD;  Location: [facility];  Service: Neurosurgery;;  . blADder tacked   1991  . CERVICAL FUSION     2001  . HERNIA REPAIR  1991  . LUMBAR FUSION  2016   L4-5  . LUMBAR FUSION  2017   L2-3  . TONSILLECTOMY AND ADENOIDECTOMY  1959  . TUMOR EXCISION     WERTHIN'S TUMORS BOTH SIDES OF NECK    There  were no vitals filed for this visit.      Subjective Assessment - 05/10/17 1453    Subjective No new complaints. No falls or pain to report. Left knee is "doing good today, however it was acting out on Sat-Mon".  Has not had a chance to do the new HEP as spouse not available over weekend to assist her and her left leg was feeling weak.             [facility] Adult PT Treatment/Exercise - 05/10/17 1459      Transfers   Transfers Sit to Stand;Stand to Sit   Sit to Stand 6: Modified independent (Device/Increase time);With upper extremity assist;From bed;From chair/3-in-1   Stand to Sit 6: Modified independent (Device/Increase time);With upper extremity assist;To bed;To chair/3-in-1     Ambulation/Gait   Ambulation/Gait Yes   Ambulation/Gait Assistance 5: Supervision   Ambulation/Gait Assistance Details cues on posture and rollator position with gait. no balance issues noted.    Ambulation Distance (Feet) 500 Feet   Assistive device Rollator   Gait Pattern Step-through pattern;Decreased step length - right;Decreased stance time - right;Right genu recurvatum;Trunk flexed;Poor foot clearance - right;Decreased hip/knee flexion - right;Step-to pattern   Ambulation Surface Level;Unlevel;Indoor;Outdoor;Paved     Knee/Hip Exercises: Seated   Long Arc Quad AROM;Strengthening;Both;1 set;10  reps;Limitations   Long Arc [employer] --  2# right, 3# left   Long Arc Quad Limitations 5 sec holds with each rep, cues to slow down with exercises, UE support on mat.   Hamstring Curl Strengthening;Both;1 set;10 reps;Limitations   Hamstring Limitations cues for slow, controlled movements using red band resistance on right, green band resistance on left             Balance Exercises - 05/10/17 1556      Balance Exercises: Standing   Standing Eyes Closed Narrow base of support (BOS);Foam/compliant surface;Head turns;Other reps (comment);30 secs;Limitations   Partial Tandem Stance Eyes  closed;Foam/compliant surface;Intermittent upper extremity support;Other reps (comment);30 secs;Limitations     Balance Exercises: Standing   Standing Eyes Opened Limitations --   Standing Eyes Closed Limitations on airex with no UE support and narrow base of support: EC no head movements, progressing to EC head movements left<>right and up<>down with min assist for balance.  cues on posture and ex form/technique.    Tandem Stance Limitations performed on airex with each foot forward in modified tandem stance: EC no head movements, progressing to EC head movements left<>right and up<>down with light fingertip support on rollator for balance assistance with head movements only. min assist as well for balance.              PT Short Term Goals - 04/28/17 1238      PT SHORT TERM GOAL #1   Title Pt will be independent with HEP for improved strength, balance, and gait.  TARGET 04/22/17   Time 5   Period Weeks   Status On-going     PT SHORT TERM GOAL #2   Title Pt will improve Berg score to at least 28/56 for decreased fall risk.   Baseline 04/18/17: 30/56 scored today   Status Achieved     PT SHORT TERM GOAL #3   Title Pt will improve TUG score to less than or equal to 19 seconds for decreased fall risk.   Baseline 04/18/17: 16.69 sec's with rollator   Status Achieved     PT SHORT TERM GOAL #4   Title Pt will perform at least 8 of 10 reps of sit<>stand transfers with minimal UE support, modified independently and no LOB, for improved safety and efficiency of transfers.   Time 5   Period Weeks   Status Achieved     PT SHORT TERM GOAL #5   Title Pt will ambulate at least 300 ft using rollator walker, supervision , for improved gait efficiency and safety.   Baseline 04/18/17: met today   Status Achieved           PT Long Term Goals - 03/21/17 1533      PT LONG TERM GOAL #1   Title Pt will verbalize understanding of fall prevention in the home environment.  TARGET 05/20/17   Time 9    Period Weeks   Status New     PT LONG TERM GOAL #2   Title Pt will improve Berg Balance score to at least 35/56 for decreased fall risk.   Time 9   Period Weeks   Status New     PT LONG TERM GOAL #3   Title Pt will improve TUG score to less than or equal to 15 seconds for decreased fall risk.   Time 5   Period Weeks   Status New     PT LONG TERM GOAL #4   Title Pt will improve  gait velocity to at least 2 ft/sec for decreased fall risk.   Time 9   Period Weeks   Status New     PT LONG TERM GOAL #5   Title Pt will ambulate at least 50-75 ft using cane, with supervision, for improved independence/gait safety in home.   Time 9   Period Weeks   Status New               Plan - 05/10/17 1458    Clinical Impression Statement Today's skilled session continued to focus on activity tolerance, strengthening and balance with no issues reported. Pt was made aware of approaching visit limit with insurance. Have also discussed this with primary PT. Pt was advised we could continue at 2x a week for 2 more weeks as is current plan of care, or decrease to 1x week for 4 weeks to spread visists out or contact insurance to see if more visists would be approved.  Pt plans to call her insurance company today and then will call the rehab center to let us know how she wishes to proceed. PT is making progress and should benefit from continued PT to progress toward unmet goals.                             Rehab Potential Good   Clinical Impairments Affecting Rehab Potential severity of deficits; pt is motivated for therapy    PT Frequency 2x / week   PT Duration 8 weeks   PT Treatment/Interventions ADLs/Self Care Home Management;Gait training;DME Instruction;Functional mobility training;Therapeutic activities;Therapeutic exercise;Balance training;Neuromuscular re-education;Patient/family education;Orthotic Fit/Training   PT Next Visit Plan Continue RLE strengthening; review HEP, cues for abdominal  activation with seated core and leg activities; gait training and balance. see clinical impression statement above regarding plan of care.   Consulted and Agree with Plan of Care Patient;Family member/caregiver   Family Member Consulted Husband      Patient will benefit from skilled therapeutic intervention in order to improve the following deficits and impairments:  Abnormal gait, Decreased balance, Decreased strength, Difficulty walking, Impaired sensation, Postural dysfunction  Visit Diagnosis: Muscle weakness (generalized)  Other abnormalities of gait and mobility  Unsteadiness on feet     Problem List Patient Active Problem List   Diagnosis Date Noted  . Sensation of pressure in bladder area 01/31/2017  . Paraplegia, incomplete ([place]) 01/07/2017  . Neurogenic bladder 12/27/2016  . 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/27/2016  . Myelopathy ([place]) 12/21/2016  . Surgery, elective   . Diabetes mellitus type 2 in nonobese (HCC)   . History of lumbar fusion   . History of fusion of cervical spine   . Neuropathic pain   . Post-operative pain   . 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 Paraparesis ([place]) 12/15/2016  . Pedal edema 12/14/2016  . Type 2 diabetes mellitus without complication, without long-term current use of insulin ([place]) 11/02/2016  . Hyperlipidemia 11/02/2016  . Essential hypertension 11/02/2016  . 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    [name], PTA, [facility] [address], [place], [place] [address] [phone] 05/10/17, 4:00 PM   Name: Megan [name] MRN: [id number] Date of Birth: 07-22-1951

## 2017-05-12 ENCOUNTER — Ambulatory Visit: Payer: BLUE CROSS/BLUE SHIELD | Admitting: Physical Therapy

## 2017-05-17 ENCOUNTER — Encounter: Payer: Self-pay | Admitting: Physical Therapy

## 2017-05-17 ENCOUNTER — Ambulatory Visit: Payer: BLUE CROSS/BLUE SHIELD | Attending: Physical Medicine & Rehabilitation | Admitting: Physical Therapy

## 2017-05-17 DIAGNOSIS — M6281 Muscle weakness (generalized): Secondary | ICD-10-CM | POA: Diagnosis present

## 2017-05-17 DIAGNOSIS — R2681 Unsteadiness on feet: Secondary | ICD-10-CM | POA: Diagnosis present

## 2017-05-17 DIAGNOSIS — R2689 Other abnormalities of gait and mobility: Secondary | ICD-10-CM | POA: Insufficient documentation

## 2017-05-19 ENCOUNTER — Ambulatory Visit: Payer: BLUE CROSS/BLUE SHIELD | Admitting: Physical Therapy

## 2017-05-19 NOTE — Therapy (Signed)
[facility] [address] [place], [place], [address] Phone: [phone]   Fax:  [phone]  Physical Therapy Treatment  Patient Details  Name: Megan [name] MRN: [id number] Date of Birth: [birth year] Referring Provider: Letta Pate  Encounter Date: 05/17/2017   05/17/17 1536  PT Visits / Re-Eval  Visit Number 16  Number of Visits 18  Date for PT Re-Evaluation 05/20/17  Authorization  Authorization Type BCBS-30 visit limit, 19 visits remaining  Authorization - Visit Number 3  Authorization - Number of Visits 19  PT Time Calculation  PT Start Time 1533  PT Stop Time 1618  PT Time Calculation (min) 45 min  PT - End of Session  Equipment Utilized During Treatment Gait belt  Activity Tolerance Patient tolerated treatment well  Behavior During Therapy [facility] for tasks assessed/performed     Past Medical History:  Diagnosis Date  . Arthritis   . Borderline diabetic   . GERD (gastroesophageal reflux disease)   . History of bronchitis 2005  . History of kidney stones   . History of shingles   . Hyperlipidemia    takes Fish Oil daily  . Neuromuscular disorder (HCC)    tingling toes  . 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[place]) 12/15/2016  . Pneumonia 2009  . PONV (postoperative nausea and vomiting)   . Restless leg   . Type 2 diabetes mellitus ([place])   . Weakness    numbness and tingling in both feet r/t back    Past Surgical History:  Procedure Laterality Date  . ABDOMINAL HYSTERECTOMY  1979  . APPLICATION OF ROBOTIC ASSISTANCE FOR SPINAL PROCEDURE  12/19/2016   Procedure: APPLICATION OF ROBOTIC ASSISTANCE FOR SPINAL PROCEDURE;  Surgeon: Kevan Ny Ditty, MD;  Location: [facility];  Service: Neurosurgery;;  . blADder tacked   1991  . CERVICAL FUSION     2001  . HERNIA REPAIR  1991  . LUMBAR FUSION  2016   L4-5  . LUMBAR FUSION  2017   L2-3  . TONSILLECTOMY AND ADENOIDECTOMY  1959  . TUMOR EXCISION      WERTHIN'S TUMORS BOTH SIDES OF NECK    There were no vitals filed for this visit.   05/17/17 1535  Symptoms/Limitations  Subjective Reports her left leg has "been acting up for 3 days now". No falls. Did have the opportunity to walk around the table with a cane over the weekend with spouse guarding her.   Patient is accompained by: Family member  Pertinent History left T10 costotransversectomy, T10-11 laminectomy for decompression, dorsal internal fixation and fusion with bilateral pedicle screws T9-T12 12/19/2016 per Dr. Cyndy Freeze, then inpatient rehab, HHPT was discharge about 2 weeks ago.  PMH arthritis, GERD, L4-5 lumbar fusion 2016, L2-3 lumbar fusion 2017, cervical fusion 2001  Patient Stated Goals Pt's goals for therapy are to get R leg stronger than what it is now; perhaps be able to walk with a cane  Pain Assessment  Currently in Pain? No/denies  Pain Score 0      05/17/17 1541  Transfers  Transfers Sit to Stand;Stand to Sit  Sit to Stand 6: Modified independent (Device/Increase time);With upper extremity assist;From bed;From chair/3-in-1  Stand to Sit 6: Modified independent (Device/Increase time);With upper extremity assist;To bed;To chair/3-in-1  Ambulation/Gait  Ambulation/Gait Yes  Ambulation/Gait Assistance 5: Supervision  Ambulation Distance (Feet) 250 Feet (x1, 100 x1)  Assistive device Rollator  Gait Pattern Step-through pattern;Decreased step length - right;Decreased stance time - right;Right genu recurvatum;Trunk flexed;Poor foot  clearance - right;Decreased hip/knee flexion - right;Step-to pattern  Ambulation Surface Level;Indoor  Gait velocity 17.93 sec's= 1.83 ft sec  Berg Balance Test  Sit to Stand 3  Standing Unsupported 4  Sitting with Back Unsupported but Feet Supported on Floor or Stool 4  Stand to Sit 4  Transfers 3  Standing Unsupported with Eyes Closed 2  Standing Ubsupported with Feet Together 2  From Standing, Reach Forward with Outstretched Arm 3 (6  inches)  From Standing Position, Pick up Object from Floor 1  From Standing Position, Turn to Look Behind Over each Shoulder 3 (right > left)  Turn 360 Degrees 1  Standing Unsupported, Alternately Place Feet on Step/Stool 0  Standing Unsupported, One Foot in Front 2  Standing on One Leg 0  Total Score 32  Berg comment: Scores <45/56 indicate increased fall risk.    Timed Up and Go Test  Normal TUG (seconds) 18.31 (with rollator)  TUG Comments Scores >13.5 seconds indicate increased fall risk.              PT Short Term Goals - 04/28/17 1238      PT SHORT TERM GOAL #1   Title Pt will be independent with HEP for improved strength, balance, and gait.  TARGET 04/22/17   Time 5   Period Weeks   Status On-going     PT SHORT TERM GOAL #2   Title Pt will improve Berg score to at least 28/56 for decreased fall risk.   Baseline 04/18/17: 30/56 scored today   Status Achieved     PT SHORT TERM GOAL #3   Title Pt will improve TUG score to less than or equal to 19 seconds for decreased fall risk.   Baseline 04/18/17: 16.69 sec's with rollator   Status Achieved     PT SHORT TERM GOAL #4   Title Pt will perform at least 8 of 10 reps of sit<>stand transfers with minimal UE support, modified independently and no LOB, for improved safety and efficiency of transfers.   Time 5   Period Weeks   Status Achieved     PT SHORT TERM GOAL #5   Title Pt will ambulate at least 300 ft using rollator walker, supervision , for improved gait efficiency and safety.   Baseline 04/18/17: met today   Status Achieved           PT Long Term Goals - 05/17/17 1540      PT LONG TERM GOAL #1   Title Pt will verbalize understanding of fall prevention in the home environment.  TARGET 05/20/17   Time 9   Period Weeks   Status New     PT LONG TERM GOAL #2   Title Pt will improve Berg Balance score to at least 35/56 for decreased fall risk.   Time 9   Period Weeks   Status New     PT LONG TERM GOAL #3    Title Pt will improve TUG score to less than or equal to 15 seconds for decreased fall risk.   Time 5   Period Weeks   Status New     PT LONG TERM GOAL #4   Title Pt will improve gait velocity to at least 2 ft/sec for decreased fall risk.   Time 9   Period Weeks   Status New     PT LONG TERM GOAL #5   Title Pt will ambulate at least 50-75 ft using cane, with supervision, for improved independence/gait  safety in home.   Baseline 05/17/17:   Time 9   Period Weeks   Status On-going        05/17/17 1536  Plan  Clinical Impression Statement Today's skilled session addressed progress toward goals for renewal. Pt has met 2, partially met 1 and not met 2 of 5 long term goals   Pt will benefit from skilled therapeutic intervention in order to improve on the following deficits Abnormal gait;Decreased balance;Decreased strength;Difficulty walking;Impaired sensation;Postural dysfunction  Rehab Potential Good  Clinical Impairments Affecting Rehab Potential severity of deficits; pt is motivated for therapy   PT Frequency 2x / week  PT Duration 8 weeks  PT Treatment/Interventions ADLs/Self Care Home Management;Gait training;DME Instruction;Functional mobility training;Therapeutic activities;Therapeutic exercise;Balance training;Neuromuscular re-education;Patient/family education;Orthotic Fit/Training  PT Next Visit Plan continued at 1x a week for remaining visits this year- PT to complete renewal. Discussed student clinic options post discharge from here- readdress this. continued to work on LE strengthening, gait with cane. ? floor transfers before discharge.   Consulted and Agree with Plan of Care Patient;Family member/caregiver  Family Member Consulted Husband    Patient will benefit from skilled therapeutic intervention in order to improve the following deficits and impairments:  Abnormal gait, Decreased balance, Decreased strength, Difficulty walking, Impaired sensation, Postural  dysfunction  Visit Diagnosis: Muscle weakness (generalized)  Other abnormalities of gait and mobility     Problem List Patient Active Problem List   Diagnosis Date Noted  . Sensation of pressure in bladder area 01/31/2017  . Paraplegia, incomplete ([place]) 01/07/2017  . Neurogenic bladder 12/27/2016  . 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/27/2016  . Myelopathy ([place]) 12/21/2016  . Surgery, elective   . Diabetes mellitus type 2 in nonobese (HCC)   . History of lumbar fusion   . History of fusion of cervical spine   . Neuropathic pain   . Post-operative pain   . 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 Paraparesis ([place]) 12/15/2016  . Pedal edema 12/14/2016  . Type 2 diabetes mellitus without complication, without long-term current use of insulin ([place]) 11/02/2016  . Hyperlipidemia 11/02/2016  . Essential hypertension 11/02/2016  . 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    [name], PTA, [facility] [address], [place], [place] [address] [phone] 05/19/17, 9:55 PM   Name: Megan [name] MRN: [id number] Date of Birth: Mar 07, 1951

## 2017-05-24 ENCOUNTER — Ambulatory Visit: Payer: BLUE CROSS/BLUE SHIELD | Admitting: Physical Therapy

## 2017-05-24 ENCOUNTER — Other Ambulatory Visit: Payer: Self-pay | Admitting: Primary Care

## 2017-05-24 DIAGNOSIS — R2689 Other abnormalities of gait and mobility: Secondary | ICD-10-CM

## 2017-05-24 DIAGNOSIS — I10 Essential (primary) hypertension: Secondary | ICD-10-CM

## 2017-05-24 DIAGNOSIS — M6281 Muscle weakness (generalized): Secondary | ICD-10-CM

## 2017-05-24 DIAGNOSIS — R2681 Unsteadiness on feet: Secondary | ICD-10-CM

## 2017-05-24 NOTE — Therapy (Signed)
[facility] [address] [place], [place], [address] Phone: [phone]   Fax:  [phone]  Physical Therapy Treatment  Patient Details  Name: Megan [name] MRN: [id number] Date of Birth: 04-25-1951 Referring Provider: Letta Pate  Encounter Date: 05/24/2017      PT End of Session - 05/24/17 1701    Visit Number 16  session cxl'd, number did not change   Number of Visits 18   Date for PT Re-Evaluation 05/20/17   Authorization Type [employer]-30 visit limit, 19 visits remaining   Authorization - Visit Number 61   Authorization - Number of Visits 19   PT Start Time 3557  no charge, session cxl'd   PT Stop Time 1625   PT Time Calculation (min) 7 min   Equipment Utilized During Treatment --   Activity Tolerance --   Behavior During Therapy WFL for tasks assessed/performed      Past Medical History:  Diagnosis Date  . Arthritis   . Borderline diabetic   . GERD (gastroesophageal reflux disease)   . History of bronchitis 2005  . History of kidney stones   . History of shingles   . Hyperlipidemia    takes Fish Oil daily  . Neuromuscular disorder (HCC)    tingling toes  . 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[place]) 12/15/2016  . Pneumonia 2009  . PONV (postoperative nausea and vomiting)   . Restless leg   . Type 2 diabetes mellitus ([facility])   . Weakness    numbness and tingling in both feet r/t back    Past Surgical History:  Procedure Laterality Date  . ABDOMINAL HYSTERECTOMY  1979  . APPLICATION OF ROBOTIC ASSISTANCE FOR SPINAL PROCEDURE  12/19/2016   Procedure: APPLICATION OF ROBOTIC ASSISTANCE FOR SPINAL PROCEDURE;  Surgeon: Kevan Ny Ditty, MD;  Location: [facility];  Service: Neurosurgery;;  . blADder tacked   1991  . CERVICAL FUSION     2001  . HERNIA REPAIR  1991  . LUMBAR FUSION  2016   L4-5  . LUMBAR FUSION  2017   L2-3  . TONSILLECTOMY AND ADENOIDECTOMY  1959  . TUMOR EXCISION     WERTHIN'S TUMORS BOTH  SIDES OF NECK    There were no vitals filed for this visit.            PT Short Term Goals - 04/28/17 1238      PT SHORT TERM GOAL #1   Title Pt will be independent with HEP for improved strength, balance, and gait.  TARGET 04/22/17   Time 5   Period Weeks   Status On-going     PT SHORT TERM GOAL #2   Title Pt will improve Berg score to at least 28/56 for decreased fall risk.   Baseline 04/18/17: 30/56 scored today   Status Achieved     PT SHORT TERM GOAL #3   Title Pt will improve TUG score to less than or equal to 19 seconds for decreased fall risk.   Baseline 04/18/17: 16.69 sec's with rollator   Status Achieved     PT SHORT TERM GOAL #4   Title Pt will perform at least 8 of 10 reps of sit<>stand transfers with minimal UE support, modified independently and no LOB, for improved safety and efficiency of transfers.   Time 5   Period Weeks   Status Achieved     PT SHORT TERM GOAL #5   Title Pt will ambulate at least 300 ft using rollator  walker, supervision , for improved gait efficiency and safety.   Baseline 04/18/17: met today   Status Achieved           PT Long Term Goals - 05/17/17 1540      PT LONG TERM GOAL #1   Title Pt will verbalize understanding of fall prevention in the home environment.  TARGET 05/20/17   Baseline 05/17/17: pt and spouse able to verbalize fall prevention strategies   Time --   Period --   Status Achieved     PT LONG TERM GOAL #2   Title Pt will improve Berg Balance score to at least 35/56 for decreased fall risk.   Baseline 05/17/17: 32/56   Time --   Period --   Status Achieved     PT LONG TERM GOAL #3   Title Pt will improve TUG score to less than or equal to 15 seconds for decreased fall risk.   Baseline 05/17/17: 18.31 sec's with rollator   Time --   Period --   Status Not Met     PT LONG TERM GOAL #4   Title Pt will improve gait velocity to at least 2 ft/sec for decreased fall risk.   Baseline 05/17/17: 1.83 ft/sec with .  improved, just not to goal   Time --   Period --   Status Partially Met     PT LONG TERM GOAL #5   Title Pt will ambulate at least 50-75 ft using cane, with supervision, for improved independence/gait safety in home.   Baseline 05/17/17: have initiated gait around table with cane in other hand, limited due to left knee instability past few weeks         Time --   Period --   Status Not Met               Plan - 05/24/17 1702    Clinical Impression Statement Pt arrived to clinic today reporting incr'd episodes of left knee instability, new onset of lower back pain and incr difficulty with standing over last few days. Has not called her MD about new symptoms. Advised pt to follow up with MD before we continue with PT due to new symptoms. Pt and spouse verbalized understanding and agreed to call her surgeon. Today's appt was cancelled.    Rehab Potential Good   Clinical Impairments Affecting Rehab Potential severity of deficits; pt is motivated for therapy    PT Frequency 2x / week   PT Duration 8 weeks   PT Treatment/Interventions ADLs/Self Care Home Management;Gait training;DME Instruction;Functional mobility training;Therapeutic activities;Therapeutic exercise;Balance training;Neuromuscular re-education;Patient/family education;Orthotic Fit/Training   PT Next Visit Plan  Discussed student clinic options post discharge from here- readdress this. continued to work on LE strengthening, gait with cane. ? floor transfers before discharge.    Consulted and Agree with Plan of Care Patient;Family member/caregiver   Family Member Consulted Husband      Patient will benefit from skilled therapeutic intervention in order to improve the following deficits and impairments:  Abnormal gait, Decreased balance, Decreased strength, Difficulty walking, Impaired sensation, Postural dysfunction  Visit Diagnosis: Muscle weakness (generalized)  Other abnormalities of gait and mobility  Unsteadiness on  feet     Problem List Patient Active Problem List   Diagnosis Date Noted  . Sensation of pressure in bladder area 01/31/2017  . Paraplegia, incomplete ([facility]) 01/07/2017  . Neurogenic bladder 12/27/2016  . 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/27/2016  . Myelopathy ([facility]) 12/21/2016  . Surgery, elective   .  Diabetes mellitus type 2 in nonobese (HCC)   . History of lumbar fusion   . History of fusion of cervical spine   . Neuropathic pain   . Post-operative pain   . 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 Paraparesis ([place] [facility]) 12/15/2016  . Pedal edema 12/14/2016  . Type 2 diabetes mellitus without complication, without long-term current use of insulin ([facility]) 11/02/2016  . Hyperlipidemia 11/02/2016  . Essential hypertension 11/02/2016  . 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    [name], PTA, [facility] [address], [place], [place] [address] [phone] 05/24/17, 5:07 PM   Name: Megan [name] MRN: [id number] Date of Birth: 1950-12-31

## 2017-05-26 DIAGNOSIS — Z0271 Encounter for disability determination: Secondary | ICD-10-CM

## 2017-06-02 ENCOUNTER — Ambulatory Visit: Payer: BLUE CROSS/BLUE SHIELD | Admitting: Physical Therapy

## 2017-06-03 ENCOUNTER — Other Ambulatory Visit: Payer: Self-pay | Admitting: Physician Assistant

## 2017-06-03 ENCOUNTER — Ambulatory Visit: Payer: BLUE CROSS/BLUE SHIELD | Admitting: Physical Therapy

## 2017-06-03 DIAGNOSIS — R29898 Other symptoms and signs involving the musculoskeletal system: Secondary | ICD-10-CM

## 2017-06-05 ENCOUNTER — Ambulatory Visit
Admission: RE | Admit: 2017-06-05 | Discharge: 2017-06-05 | Disposition: A | Discharge status: Home / Self Care | Payer: BLUE CROSS/BLUE SHIELD | Source: Ambulatory Visit | Attending: Physician Assistant | Admitting: Physician Assistant

## 2017-06-05 DIAGNOSIS — R29898 Other symptoms and signs involving the musculoskeletal system: Secondary | ICD-10-CM

## 2017-06-05 MED ORDER — GADOBENATE DIMEGLUMINE 529 MG/ML IV SOLN
16.0000 mL | Freq: Once | INTRAVENOUS | Status: AC | PRN
  Administered 2017-06-05: 16 mL via INTRAVENOUS

## 2017-06-07 ENCOUNTER — Ambulatory Visit
Admission: RE | Admit: 2017-06-07 | Discharge: 2017-06-07 | Disposition: A | Discharge status: Home / Self Care | Payer: BLUE CROSS/BLUE SHIELD | Source: Ambulatory Visit | Attending: Physician Assistant | Admitting: Physician Assistant

## 2017-06-07 DIAGNOSIS — R29898 Other symptoms and signs involving the musculoskeletal system: Secondary | ICD-10-CM

## 2017-06-09 ENCOUNTER — Ambulatory Visit: Payer: BLUE CROSS/BLUE SHIELD | Admitting: Physical Therapy

## 2017-06-15 ENCOUNTER — Other Ambulatory Visit: Payer: BLUE CROSS/BLUE SHIELD

## 2017-06-21 ENCOUNTER — Telehealth: Payer: Self-pay | Admitting: Vascular Surgery

## 2017-06-21 ENCOUNTER — Other Ambulatory Visit: Payer: Self-pay | Admitting: Neurological Surgery

## 2017-06-21 ENCOUNTER — Other Ambulatory Visit: Payer: Self-pay | Admitting: *Deleted

## 2017-06-21 NOTE — Telephone Encounter (Signed)
-----   Message from Willy Eddy, RN sent at 06/21/2017  1:16 PM EDT ----- Regarding: need office appointment  Please schedule an office appointment for this patient with Dr. Scot Dock pre-op  AL[facility]. Surgery date is 07/26/2017. Please notify patient of  this appointment. If you have any questions or you need anything from me; just left me know. THANKS B

## 2017-06-21 NOTE — Telephone Encounter (Signed)
Sched appt 07/07/17 at 1:00. spoke to pt.

## 2017-06-22 ENCOUNTER — Other Ambulatory Visit: Payer: Self-pay | Admitting: *Deleted

## 2017-06-27 ENCOUNTER — Other Ambulatory Visit (HOSPITAL_COMMUNITY): Payer: Self-pay | Admitting: Neurological Surgery

## 2017-06-27 DIAGNOSIS — M4727 Other spondylosis with radiculopathy, lumbosacral region: Secondary | ICD-10-CM

## 2017-06-28 ENCOUNTER — Other Ambulatory Visit: Payer: Self-pay | Admitting: *Deleted

## 2017-06-30 ENCOUNTER — Other Ambulatory Visit: Payer: Self-pay | Admitting: *Deleted

## 2017-07-05 ENCOUNTER — Other Ambulatory Visit: Payer: Self-pay | Admitting: *Deleted

## 2017-07-06 ENCOUNTER — Ambulatory Visit (HOSPITAL_COMMUNITY)
Admission: RE | Admit: 2017-07-06 | Discharge: 2017-07-06 | Disposition: A | Discharge status: Home / Self Care | Payer: BLUE CROSS/BLUE SHIELD | Source: Ambulatory Visit | Attending: Neurological Surgery | Admitting: Neurological Surgery

## 2017-07-06 DIAGNOSIS — M5127 Other intervertebral disc displacement, lumbosacral region: Secondary | ICD-10-CM | POA: Diagnosis not present

## 2017-07-06 DIAGNOSIS — M4727 Other spondylosis with radiculopathy, lumbosacral region: Secondary | ICD-10-CM | POA: Diagnosis present

## 2017-07-07 ENCOUNTER — Encounter: Payer: Self-pay | Admitting: Vascular Surgery

## 2017-07-07 ENCOUNTER — Ambulatory Visit (INDEPENDENT_AMBULATORY_CARE_PROVIDER_SITE_OTHER): Payer: BLUE CROSS/BLUE SHIELD | Admitting: Vascular Surgery

## 2017-07-07 VITALS — BP 152/79 | HR 76 | Temp 99.0°F | Resp 14 | Ht 68.0 in | Wt 175.0 lb

## 2017-07-07 DIAGNOSIS — M5137 Other intervertebral disc degeneration, lumbosacral region: Secondary | ICD-10-CM | POA: Diagnosis not present

## 2017-07-07 NOTE — H&P (View-Only) (Signed)
Patient name: Megan [name] MRN: [id number] DOB: May 16, 1951 Sex: female   REASON FOR CONSULT:    Evaluate for anterior retroperitoneal exposure of L5-S1.  The consult is requested by Dr. Cyndy Freeze.  HPI:   Megan [name] is a pleasant 66 y.o. female, who is referred for evaluation of anterior retroperitoneal exposure of L5-S1.  Patient has had 3 previous back operations.  Her first operation was at the L4-L5 level from posterior approach.  She had subsequent work at L2-L3.  Most recently she had work from [employer].  She now has disc protrusion at L5-S1 and is felt to be a good candidate for anterior lumbar interbody fusion.  Her only previous abdominal surgery was a bladder tacking and hernia repair back in 1991.  I do not get any history of claudication although her activity is very limited because of her back issues.  She denies any history of rest pain or nonhealing ulcers.  Risk factors for peripheral vascular disease include diabetes, hypertension, hypercholesterolemia, and a remote history of tobacco use.  Past Medical History:  Diagnosis Date  . Arthritis   . Borderline diabetic   . GERD (gastroesophageal reflux disease)   . History of bronchitis 2005  . History of kidney stones   . History of shingles   . Hyperlipidemia    takes Fish Oil daily  . Neuromuscular disorder (HCC)    tingling toes  . 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[facility]) 12/15/2016  . Pneumonia 2009  . PONV (postoperative nausea and vomiting)   . Restless leg   . Type 2 diabetes mellitus ([facility])   . Weakness    numbness and tingling in both feet r/t back    Family History  Problem Relation Age of Onset  . Diabetes Mother   . Dementia Mother   . Cirrhosis Mother        Non alcoholic  . COPD Father   . Diabetes Brother   There is no family history of premature cardiovascular disease.  SOCIAL HISTORY: She quit smoking in 2007. Social History   Social History  . Marital status: Married    Spouse  name: [name]  . Number of children: 1  . Years of education: 68   Occupational History  . Not on file.   Social History Main Topics  . Smoking status: Former Smoker    Packs/day: 1.50    Types: Cigarettes    Quit date: 05/13/2006  . Smokeless tobacco: Never Used  . Alcohol use 0.0 oz/week     Comment: once in a while  . Drug use: No  . Sexual activity: Not on file   Other Topics Concern  . Not on file   Social History Narrative   Lives w/ husband   Married.   1 child, 1 grandchildren.   Retired. Once worked for [employer].   Enjoys reading.     Allergies  Allergen Reactions  . Shellfish Allergy Anaphylaxis, Swelling and Other (See Comments)    Tongue swells  . Oxycodone Nausea Only  . Penicillins Rash    Has patient had a PCN reaction causing immediate rash, facial/tongue/throat swelling, SOB or lightheadedness with hypotension: Yes Has patient had a PCN reaction causing severe rash involving mucus membranes or skin necrosis: No Has patient had a PCN reaction that required hospitalization No Has patient had a PCN reaction occurring within the last 10 years: No If all of the above answers are "NO", then may proceed with Cephalosporin use.  Current Outpatient Prescriptions  Medication Sig Dispense Refill  . AFLURIA QUADRIVALENT 0.5 ML injection ADM 0.5ML IM UTD  0  . atorvastatin (LIPITOR) 20 MG tablet Take 1 tablet (20 mg total) by mouth every evening. 90 tablet 3  . cholecalciferol (VITAMIN D) 1000 units tablet Take 1 tablet (1,000 Units total) by mouth daily after lunch. 30 tablet 1  . Coenzyme Q10 (COQ10 PO) Take 1 capsule by mouth daily after supper.    . Cranberry 500 MG TABS Take 500 mg by mouth daily after supper.    . gabapentin (NEURONTIN) 300 MG capsule Take 1 capsule (300 mg total) by mouth 3 (three) times daily. 90 capsule 2  . HYDROcodone-acetaminophen (NORCO/VICODIN) 5-325 MG tablet   0  . lisinopril (PRINIVIL,ZESTRIL) 10 MG tablet TAKE 1 TABLET(10 MG)  BY MOUTH DAILY 90 tablet 1  . metFORMIN (GLUCOPHAGE) 500 MG tablet Take 1 tablet (500 mg total) by mouth 2 (two) times daily with a meal. 180 tablet 2  . methocarbamol (ROBAXIN) 750 MG tablet Take 1 tablet (750 mg total) by mouth 3 (three) times daily. 90 tablet 2  . Multiple Vitamin (MULTIVITAMIN WITH MINERALS) TABS tablet Take 1 tablet by mouth daily after lunch.     . pantoprazole (PROTONIX) 40 MG tablet Take 1 tablet (40 mg total) by mouth daily. 30 tablet 1  . senna-docusate (SENOKOT-S) 8.6-50 MG tablet Take 2 tablets by mouth 2 (two) times daily.    . traMADol (ULTRAM) 50 MG tablet Take 50 mg by mouth every 6 (six) hours.    Marland Kitchen dexamethasone (DECADRON) 2 MG tablet Take 1 tablet (2 mg total) by mouth daily. (Patient not taking: Reported on 07/07/2017) 10 tablet 0   No current facility-administered medications for this visit.     REVIEW OF SYSTEMS:  [X]  denotes positive finding, [ ]  denotes negative finding Cardiac  Comments:  Chest pain or chest pressure:    Shortness of breath upon exertion:    Short of breath when lying flat:    Irregular heart rhythm:        Vascular    Pain in calf, thigh, or hip brought on by ambulation: X   Pain in feet at night that wakes you up from your sleep:     Blood clot in your veins:    Leg swelling:         Pulmonary    Oxygen at home:    Productive cough:     Wheezing:         Neurologic    Weakness in arms or legs:  X   Sudden numbness in arms or legs:     Sudden onset of difficulty speaking or slurred speech:    Temporary loss of vision in one eye:     Problems with dizziness:         Gastrointestinal    Blood in stool:     Vomited blood:         Genitourinary    Burning when urinating:     Blood in urine:        Psychiatric    Major depression:         Hematologic    Bleeding problems:    Problems with blood clotting too easily:        Skin    Rashes or ulcers:        Constitutional    Fever or chills:     PHYSICAL  EXAM:   Vitals:  07/07/17 1302 07/07/17 1303  BP: (!) 147/80 (!) 152/79  Pulse: 73 76  Resp: 14   Temp: 99 F (37.2 C)   SpO2: 99%   Weight: 175 lb (79.4 kg)   Height: 5\' 8"  (1.727 m)     GENERAL: The patient is a well-nourished female, in no acute distress. The vital signs are documented above. CARDIAC: There is a regular rate and rhythm.  VASCULAR: I do not detect carotid bruits.  She has palpable femoral pulses. On the right side, I cannot palpate pedal pulses however she has biphasic Doppler signals in the posterior tibial and anterior tibial positions. On the left side she has a palpable posterior tibial pulses are diminished but palpable dorsalis pedis pulse.  She has biphasic signals in the left dorsalis pedis and posterior tibial positions. She has mild bilateral lower extremity swelling. PULMONARY: There is good air exchange bilaterally without wheezing or rales. ABDOMEN: Soft and non-tender with normal pitched bowel sounds.  MUSCULOSKELETAL: There are no major deformities or cyanosis. NEUROLOGIC: No focal weakness or paresthesias are detected. SKIN: There are no ulcers or rashes noted. PSYCHIATRIC: The patient has a normal affect.  DATA:    CT LUMBAR SPINE: I have reviewed the images of the CT lumbar spine that was done on 06/07/2017.  This showed stable left paracentral disc extrusion at L5-S1 and potential left S1 nerve root encroachment.  The patient does have significant calcific disease in the aorta and common iliac arteries.  MEDICAL ISSUES:   DEGENERATIVE DISC DISEASE L5-S1: The patient appears to be a reasonable candidate for anterior retroperitoneal exposure of L5-S1.  The patient does have significant calcific disease in the common iliac arteries.  However, given that this is at the L5-S1 level do not think this is a contraindication to an anterior approach.  We should not have to put too much traction on the iliac vessels to expose the L5-S1 disc. I have reviewed  our role in exposure of the spine in order to allow anterior lumbar interbody fusion at the appropriate levels. We have discussed the potential complications of surgery, including but not limited to, arterial or venous injury, thrombosis, or bleeding. We have also discussed the potential risks of wound healing problems, the development of a hernia, nerve injury, leg swelling, or other unpredictable medical problems.  All the patient's questions were answered and they are agreeable to proceed.  Her surgery is scheduled for 07/26/2017.   Deitra Mayo [facility] [phone]

## 2017-07-07 NOTE — Progress Notes (Signed)
Patient name: Megan [name] MRN: [id number] DOB: Jan 20, 1951 Sex: female   REASON FOR CONSULT:    Evaluate for anterior retroperitoneal exposure of L5-S1.  The consult is requested by Dr. Cyndy Freeze.  HPI:   Megan [name] is a pleasant 66 y.o. female, who is referred for evaluation of anterior retroperitoneal exposure of L5-S1.  Patient has had 3 previous back operations.  Her first operation was at the L4-L5 level from posterior approach.  She had subsequent work at L2-L3.  Most recently she had work from [employer].  She now has disc protrusion at L5-S1 and is felt to be a good candidate for anterior lumbar interbody fusion.  Her only previous abdominal surgery was a bladder tacking and hernia repair back in 1991.  I do not get any history of claudication although her activity is very limited because of her back issues.  She denies any history of rest pain or nonhealing ulcers.  Risk factors for peripheral vascular disease include diabetes, hypertension, hypercholesterolemia, and a remote history of tobacco use.  Past Medical History:  Diagnosis Date  . Arthritis   . Borderline diabetic   . GERD (gastroesophageal reflux disease)   . History of bronchitis 2005  . History of kidney stones   . History of shingles   . Hyperlipidemia    takes Fish Oil daily  . Neuromuscular disorder (HCC)    tingling toes  . 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[facility]) 12/15/2016  . Pneumonia 2009  . PONV (postoperative nausea and vomiting)   . Restless leg   . Type 2 diabetes mellitus ([facility])   . Weakness    numbness and tingling in both feet r/t back    Family History  Problem Relation Age of Onset  . Diabetes Mother   . Dementia Mother   . Cirrhosis Mother        Non alcoholic  . COPD Father   . Diabetes Brother   There is no family history of premature cardiovascular disease.  SOCIAL HISTORY: She quit smoking in 2007. Social History   Social History  . Marital status: Married    Spouse  name: [name]  . Number of children: 1  . Years of education: 8   Occupational History  . Not on file.   Social History Main Topics  . Smoking status: Former Smoker    Packs/day: 1.50    Types: Cigarettes    Quit date: 05/13/2006  . Smokeless tobacco: Never Used  . Alcohol use 0.0 oz/week     Comment: once in a while  . Drug use: No  . Sexual activity: Not on file   Other Topics Concern  . Not on file   Social History Narrative   Lives w/ husband   Married.   1 child, 1 grandchildren.   Retired. Once worked for [employer].   Enjoys reading.     Allergies  Allergen Reactions  . Shellfish Allergy Anaphylaxis, Swelling and Other (See Comments)    Tongue swells  . Oxycodone Nausea Only  . Penicillins Rash    Has patient had a PCN reaction causing immediate rash, facial/tongue/throat swelling, SOB or lightheadedness with hypotension: Yes Has patient had a PCN reaction causing severe rash involving mucus membranes or skin necrosis: No Has patient had a PCN reaction that required hospitalization No Has patient had a PCN reaction occurring within the last 10 years: No If all of the above answers are "NO", then may proceed with Cephalosporin use.  Current Outpatient Prescriptions  Medication Sig Dispense Refill  . AFLURIA QUADRIVALENT 0.5 ML injection ADM 0.5ML IM UTD  0  . atorvastatin (LIPITOR) 20 MG tablet Take 1 tablet (20 mg total) by mouth every evening. 90 tablet 3  . cholecalciferol (VITAMIN D) 1000 units tablet Take 1 tablet (1,000 Units total) by mouth daily after lunch. 30 tablet 1  . Coenzyme Q10 (COQ10 PO) Take 1 capsule by mouth daily after supper.    . Cranberry 500 MG TABS Take 500 mg by mouth daily after supper.    . gabapentin (NEURONTIN) 300 MG capsule Take 1 capsule (300 mg total) by mouth 3 (three) times daily. 90 capsule 2  . HYDROcodone-acetaminophen (NORCO/VICODIN) 5-325 MG tablet   0  . lisinopril (PRINIVIL,ZESTRIL) 10 MG tablet TAKE 1 TABLET(10 MG)  BY MOUTH DAILY 90 tablet 1  . metFORMIN (GLUCOPHAGE) 500 MG tablet Take 1 tablet (500 mg total) by mouth 2 (two) times daily with a meal. 180 tablet 2  . methocarbamol (ROBAXIN) 750 MG tablet Take 1 tablet (750 mg total) by mouth 3 (three) times daily. 90 tablet 2  . Multiple Vitamin (MULTIVITAMIN WITH MINERALS) TABS tablet Take 1 tablet by mouth daily after lunch.     . pantoprazole (PROTONIX) 40 MG tablet Take 1 tablet (40 mg total) by mouth daily. 30 tablet 1  . senna-docusate (SENOKOT-S) 8.6-50 MG tablet Take 2 tablets by mouth 2 (two) times daily.    . traMADol (ULTRAM) 50 MG tablet Take 50 mg by mouth every 6 (six) hours.    Marland Kitchen dexamethasone (DECADRON) 2 MG tablet Take 1 tablet (2 mg total) by mouth daily. (Patient not taking: Reported on 07/07/2017) 10 tablet 0   No current facility-administered medications for this visit.     REVIEW OF SYSTEMS:  [X]  denotes positive finding, [ ]  denotes negative finding Cardiac  Comments:  Chest pain or chest pressure:    Shortness of breath upon exertion:    Short of breath when lying flat:    Irregular heart rhythm:        Vascular    Pain in calf, thigh, or hip brought on by ambulation: X   Pain in feet at night that wakes you up from your sleep:     Blood clot in your veins:    Leg swelling:         Pulmonary    Oxygen at home:    Productive cough:     Wheezing:         Neurologic    Weakness in arms or legs:  X   Sudden numbness in arms or legs:     Sudden onset of difficulty speaking or slurred speech:    Temporary loss of vision in one eye:     Problems with dizziness:         Gastrointestinal    Blood in stool:     Vomited blood:         Genitourinary    Burning when urinating:     Blood in urine:        Psychiatric    Major depression:         Hematologic    Bleeding problems:    Problems with blood clotting too easily:        Skin    Rashes or ulcers:        Constitutional    Fever or chills:     PHYSICAL  EXAM:   Vitals:  07/07/17 1302 07/07/17 1303  BP: (!) 147/80 (!) 152/79  Pulse: 73 76  Resp: 14   Temp: 99 F (37.2 C)   SpO2: 99%   Weight: 175 lb (79.4 kg)   Height: 5\' 8"  (1.727 m)     GENERAL: The patient is a well-nourished female, in no acute distress. The vital signs are documented above. CARDIAC: There is a regular rate and rhythm.  VASCULAR: I do not detect carotid bruits.  She has palpable femoral pulses. On the right side, I cannot palpate pedal pulses however she has biphasic Doppler signals in the posterior tibial and anterior tibial positions. On the left side she has a palpable posterior tibial pulses are diminished but palpable dorsalis pedis pulse.  She has biphasic signals in the left dorsalis pedis and posterior tibial positions. She has mild bilateral lower extremity swelling. PULMONARY: There is good air exchange bilaterally without wheezing or rales. ABDOMEN: Soft and non-tender with normal pitched bowel sounds.  MUSCULOSKELETAL: There are no major deformities or cyanosis. NEUROLOGIC: No focal weakness or paresthesias are detected. SKIN: There are no ulcers or rashes noted. PSYCHIATRIC: The patient has a normal affect.  DATA:    CT LUMBAR SPINE: I have reviewed the images of the CT lumbar spine that was done on 06/07/2017.  This showed stable left paracentral disc extrusion at L5-S1 and potential left S1 nerve root encroachment.  The patient does have significant calcific disease in the aorta and common iliac arteries.  MEDICAL ISSUES:   DEGENERATIVE DISC DISEASE L5-S1: The patient appears to be a reasonable candidate for anterior retroperitoneal exposure of L5-S1.  The patient does have significant calcific disease in the common iliac arteries.  However, given that this is at the L5-S1 level do not think this is a contraindication to an anterior approach.  We should not have to put too much traction on the iliac vessels to expose the L5-S1 disc. I have reviewed  our role in exposure of the spine in order to allow anterior lumbar interbody fusion at the appropriate levels. We have discussed the potential complications of surgery, including but not limited to, arterial or venous injury, thrombosis, or bleeding. We have also discussed the potential risks of wound healing problems, the development of a hernia, nerve injury, leg swelling, or other unpredictable medical problems.  All the patient's questions were answered and they are agreeable to proceed.  Her surgery is scheduled for 07/26/2017.   Deitra Mayo [facility] [phone]

## 2017-07-07 NOTE — Progress Notes (Signed)
Vitals:   07/07/17 1302  BP: (!) 147/80  Pulse: 73  Resp: 14  Temp: 99 F (37.2 C)  SpO2: 99%  Weight: 175 lb (79.4 kg)  Height: 5\' 8"  (1.727 m)

## 2017-07-12 ENCOUNTER — Other Ambulatory Visit: Payer: Self-pay | Admitting: Physical Medicine & Rehabilitation

## 2017-07-13 ENCOUNTER — Other Ambulatory Visit: Payer: Self-pay | Admitting: Primary Care

## 2017-07-13 MED ORDER — GABAPENTIN 300 MG PO CAPS: 300.0000 mg | ORAL_CAPSULE | Freq: Three times a day (TID) | ORAL | 2 refills | Status: DC

## 2017-07-13 NOTE — Telephone Encounter (Signed)
Gabapentin sent to pharmacy per request

## 2017-07-13 NOTE — Telephone Encounter (Signed)
Patient was notified of the refill

## 2017-07-13 NOTE — Telephone Encounter (Signed)
.   My last note indicates that the patient will be getting her gabapentin from primary care

## 2017-07-13 NOTE — Telephone Encounter (Signed)
Recieved electronic medication refill request for gabapentin, no follow up scheduled for patient and note stated to f/u on an as-needed basis  Not sure to refill this medication based on this information, please advise

## 2017-07-13 NOTE — Telephone Encounter (Signed)
Pt. Called to report Dr. Lynford Citizen started her on Gabapentin 300 mg po and instructed her to get further refills from Allie Bossier NP. States she has 1 week supply left.

## 2017-07-13 NOTE — Addendum Note (Signed)
Addended by: Jearld Fenton on: 07/13/2017 11:40 AM   Modules accepted: Orders

## 2017-07-13 NOTE — Telephone Encounter (Signed)
Copied from [place] [id number]. Topic: Quick Communication - See Telephone Encounter >> Jul 13, 2017 10:59 AM [name], NT wrote: CRM for notification. See Telephone encounter for:  07/13/17. Patient is calling because she needs a refill on her gabapentin

## 2017-07-15 ENCOUNTER — Other Ambulatory Visit: Payer: Self-pay | Admitting: *Deleted

## 2017-07-21 ENCOUNTER — Other Ambulatory Visit: Payer: Self-pay

## 2017-07-21 ENCOUNTER — Other Ambulatory Visit: Payer: Self-pay | Admitting: *Deleted

## 2017-07-21 ENCOUNTER — Encounter (HOSPITAL_COMMUNITY)
Admission: RE | Admit: 2017-07-21 | Discharge: 2017-07-21 | Disposition: A | Discharge status: Home / Self Care | Payer: BLUE CROSS/BLUE SHIELD | Source: Ambulatory Visit | Attending: Neurological Surgery | Admitting: Neurological Surgery

## 2017-07-21 ENCOUNTER — Encounter (HOSPITAL_COMMUNITY): Payer: Self-pay

## 2017-07-21 DIAGNOSIS — Z01810 Encounter for preprocedural cardiovascular examination: Secondary | ICD-10-CM | POA: Diagnosis present

## 2017-07-21 DIAGNOSIS — I444 Left anterior fascicular block: Secondary | ICD-10-CM | POA: Insufficient documentation

## 2017-07-21 DIAGNOSIS — Z87891 Personal history of nicotine dependence: Secondary | ICD-10-CM | POA: Diagnosis not present

## 2017-07-21 DIAGNOSIS — Z01812 Encounter for preprocedural laboratory examination: Secondary | ICD-10-CM | POA: Insufficient documentation

## 2017-07-21 DIAGNOSIS — K219 Gastro-esophageal reflux disease without esophagitis: Secondary | ICD-10-CM | POA: Insufficient documentation

## 2017-07-21 DIAGNOSIS — E119 Type 2 diabetes mellitus without complications: Secondary | ICD-10-CM | POA: Insufficient documentation

## 2017-07-21 DIAGNOSIS — I10 Essential (primary) hypertension: Secondary | ICD-10-CM | POA: Diagnosis not present

## 2017-07-21 DIAGNOSIS — M1990 Unspecified osteoarthritis, unspecified site: Secondary | ICD-10-CM | POA: Insufficient documentation

## 2017-07-21 DIAGNOSIS — Z8701 Personal history of pneumonia (recurrent): Secondary | ICD-10-CM | POA: Insufficient documentation

## 2017-07-21 HISTORY — DX: Dyspnea, unspecified: R06.00

## 2017-07-21 LAB — BASIC METABOLIC PANEL
ANION GAP: 8 (ref 5–15)
BUN: 11 mg/dL (ref 6–20)
CO2: 24 mmol/L (ref 22–32)
Calcium: 9 mg/dL (ref 8.9–10.3)
Chloride: 105 mmol/L (ref 101–111)
Creatinine, Ser: 0.85 mg/dL (ref 0.44–1.00)
GFR calc Af Amer: >60 mL/min (ref >60)
GLUCOSE: 208 mg/dL — AB (ref 65–99)
POTASSIUM: 4.1 mmol/L (ref 3.5–5.1)
Sodium: 137 mmol/L (ref 135–145)

## 2017-07-21 LAB — CBC
HEMATOCRIT: 38.8 % (ref 36.0–46.0)
Hemoglobin: 12.7 g/dL (ref 12.0–15.0)
MCH: 29.3 pg (ref 26.0–34.0)
MCHC: 32.7 g/dL (ref 30.0–36.0)
MCV: 89.6 fL (ref 78.0–100.0)
PLATELETS: 208 10*3/uL (ref 150–400)
RBC: 4.33 MIL/uL (ref 3.87–5.11)
RDW: 12.9 % (ref 11.5–15.5)
WBC: 5.7 10*3/uL (ref 4.0–10.5)

## 2017-07-21 LAB — SURGICAL PCR SCREEN
MRSA, PCR: NEGATIVE
STAPHYLOCOCCUS AUREUS: NEGATIVE

## 2017-07-21 LAB — TYPE AND SCREEN
ABO/RH(D): A POS
ANTIBODY SCREEN: NEGATIVE

## 2017-07-21 LAB — GLUCOSE, CAPILLARY: Glucose-Capillary: 207 mg/dL — ABNORMAL HIGH (ref 65–99)

## 2017-07-21 LAB — HEMOGLOBIN A1C
Hgb A1c MFr Bld: 6.7 % — ABNORMAL HIGH (ref 4.8–5.6)
Mean Plasma Glucose: 145.59 mg/dL

## 2017-07-21 NOTE — Pre-Procedure Instructions (Signed)
Megan [name] [facility]  07/21/2017      [facility] - [address], [place] [address] Phone: [phone] Fax: [phone]    Your procedure is scheduled on  Tuesday 07/26/17  Report to [facility] Admitting at 530 A.M.  Call this number if you have problems the morning of surgery:  [phone]   Remember:  Do not eat food or drink liquids after midnight.  Take these medicines the morning of surgery with A SIP OF WATER  ESOMEPRAZOLE (NEXIUM), GABAPENTIN, HYDROCODONE IF NEEDED,  OXYBUTYNIN  7 days prior to surgery STOP taking any Aspirin (unless otherwise instructed by your surgeon), Aleve, Naproxen, Ibuprofen, Motrin, Advil, Goody's, BC's, all herbal medications, fish oil, and all vitamins, GARLIC, CO Q 10     How to Manage Your Diabetes Before and After Surgery  Why is it important to control my blood sugar before and after surgery? . Improving blood sugar levels before and after surgery helps healing and can limit problems. . A way of improving blood sugar control is eating a healthy diet by: o  Eating less sugar and carbohydrates o  Increasing activity/exercise o  Talking with your doctor about reaching your blood sugar goals . High blood sugars (greater than 180 mg/dL) can raise your risk of infections and slow your recovery, so you will need to focus on controlling your diabetes during the weeks before surgery. . Make sure that the doctor who takes care of your diabetes knows about your planned surgery including the date and location.  How do I manage my blood sugar before surgery? . Check your blood sugar at least 4 times a day, starting 2 days before surgery, to make sure that the level is not too high or low. o Check your blood sugar the morning of your surgery when you wake up and every 2 hours until you get to the Short Stay unit. . If your blood sugar is  less than 70 mg/dL, you will need to treat for low blood sugar: o Do not take insulin. o Treat a low blood sugar (less than 70 mg/dL) with  cup of clear juice (cranberry or apple), 4 glucose tablets, OR glucose gel. Recheck blood sugar in 15 minutes after treatment (to make sure it is greater than 70 mg/dL). If your blood sugar is not greater than 70 mg/dL on recheck, call [phone] o  for further instructions. . Report your blood sugar to the short stay nurse when you get to Short Stay.  . If you are admitted to the hospital after surgery: o Your blood sugar will be checked by the staff and you will probably be given insulin after surgery (instead of oral diabetes medicines) to make sure you have good blood sugar levels. o The goal for blood sugar control after surgery is 80-180 mg/dL.              WHAT DO I DO ABOUT MY DIABETES MEDICATION?   Marland Kitchen Do not take oral diabetes medicines (pills) the morning of surgery.  .       .  .   .   Other Instructions:          :  :   Reviewed and Endorsed by [facility] Patient Education Committee, August 2015  Do not wear jewelry, make-up or nail polish.  Do not wear lotions, powders, or perfumes,  or deoderant.  Do not shave 48 hours prior to surgery.  Men may shave face and neck.  Do not bring valuables to the hospital.  [facility] is not responsible for any belongings or valuables.  Contacts, dentures or bridgework may not be worn into surgery.  Leave your suitcase in the car.  After surgery it may be brought to your room.  For patients admitted to the hospital, discharge time will be determined by your treatment team.  Patients discharged the day of surgery will not be allowed to drive home.   Name and phone number of your driver:    Special instructions:  [facility] - Preparing for Surgery  Before surgery, you can play an important role.  Because skin is not sterile, your skin needs to be as free of germs as  possible.  You can reduce the number of germs on you skin by washing with CHG (chlorahexidine gluconate) soap before surgery.  CHG is an antiseptic cleaner which kills germs and bonds with the skin to continue killing germs even after washing.  Please DO NOT use if you have an allergy to CHG or antibacterial soaps.  If your skin becomes reddened/irritated stop using the CHG and inform your nurse when you arrive at Short Stay.  Do not shave (including legs and underarms) for at least 48 hours prior to the first CHG shower.  You may shave your face.  Please follow these instructions carefully:   1.  Shower with CHG Soap the night before surgery and the                                morning of Surgery.  2.  If you choose to wash your hair, wash your hair first as usual with your       normal shampoo.  3.  After you shampoo, rinse your hair and body thoroughly to remove the                      Shampoo.  4.  Use CHG as you would any other liquid soap.  You can apply chg directly       to the skin and wash gently with scrungie or a clean washcloth.  5.  Apply the CHG Soap to your body ONLY FROM THE NECK DOWN.        Do not use on open wounds or open sores.  Avoid contact with your eyes,       ears, mouth and genitals (private parts).  Wash genitals (private parts)       with your normal soap.  6.  Wash thoroughly, paying special attention to the area where your surgery        will be performed.  7.  Thoroughly rinse your body with warm water from the neck down.  8.  DO NOT shower/wash with your normal soap after using and rinsing off       the CHG Soap.  9.  Pat yourself dry with a clean towel.            10.  Wear clean pajamas.            11.  Place clean sheets on your bed the night of your first shower and do not        sleep with pets.  Day of Surgery  Do not apply any lotions/deoderants the morning of  surgery.  Please wear clean clothes to the hospital/surgery center.    Please read over  the following fact sheets that you were given. MRSA Information and Surgical Site Infection Prevention

## 2017-07-25 ENCOUNTER — Ambulatory Visit: Payer: BLUE CROSS/BLUE SHIELD | Admitting: Primary Care

## 2017-07-25 NOTE — Anesthesia Preprocedure Evaluation (Addendum)
Anesthesia Evaluation  Patient identified by MRN, date of birth, ID band Patient awake    Reviewed: Allergy & Precautions, NPO status , Patient's Chart, lab work & pertinent test results  History of Anesthesia Complications (+) PONV and history of anesthetic complications  Airway Mallampati: II  TM Distance: >3 FB Neck ROM: Full    Dental  (+) Partial Upper, Partial Lower, Missing,    Pulmonary pneumonia, former smoker,    Pulmonary exam normal        Cardiovascular hypertension, (-) anginaNormal cardiovascular exam     Neuro/Psych Chronic back pain: tramadol    GI/Hepatic Neg liver ROS, GERD  ,  Endo/Other  negative endocrine ROSdiabetes  Renal/GU negative Renal ROS     Musculoskeletal  (+) Arthritis ,   Abdominal   Peds  Hematology negative hematology ROS (+)   Anesthesia Other Findings   Reproductive/Obstetrics                            Anesthesia Physical  Anesthesia Plan  ASA: II  Anesthesia Plan: General   Post-op Pain Management:    Induction: Intravenous  PONV Risk Score and Plan: 4 or greater and Ondansetron, Dexamethasone, Scopolamine patch - Pre-op and Diphenhydramine  Airway Management Planned: Oral ETT  Additional Equipment: Arterial line  Intra-op Plan:   Post-operative Plan: Extubation in OR  Informed Consent: I have reviewed the patients History and Physical, chart, labs and discussed the procedure including the risks, benefits and alternatives for the proposed anesthesia with the patient or authorized representative who has indicated his/her understanding and acceptance.   Dental advisory given  Plan Discussed with: Anesthesiologist and CRNA  Anesthesia Plan Comments:       Anesthesia Quick Evaluation

## 2017-07-26 ENCOUNTER — Inpatient Hospital Stay (HOSPITAL_COMMUNITY): Payer: BLUE CROSS/BLUE SHIELD | Admitting: Anesthesiology

## 2017-07-26 ENCOUNTER — Inpatient Hospital Stay (HOSPITAL_COMMUNITY)
Admission: RE | Admit: 2017-07-26 | Discharge: 2017-07-28 | DRG: 454 | Disposition: A | Discharge status: Rehabilitation Facility | Payer: BLUE CROSS/BLUE SHIELD | Source: Ambulatory Visit | Attending: Neurological Surgery | Admitting: Neurological Surgery

## 2017-07-26 ENCOUNTER — Inpatient Hospital Stay (HOSPITAL_COMMUNITY): Payer: BLUE CROSS/BLUE SHIELD

## 2017-07-26 ENCOUNTER — Inpatient Hospital Stay (HOSPITAL_COMMUNITY)
Admission: RE | Disposition: A | Discharge status: Rehabilitation Facility | Payer: Self-pay | Source: Ambulatory Visit | Attending: Neurological Surgery

## 2017-07-26 ENCOUNTER — Encounter (HOSPITAL_COMMUNITY): Payer: Self-pay | Admitting: *Deleted

## 2017-07-26 ENCOUNTER — Other Ambulatory Visit: Payer: Self-pay

## 2017-07-26 DIAGNOSIS — Z833 Family history of diabetes mellitus: Secondary | ICD-10-CM | POA: Diagnosis not present

## 2017-07-26 DIAGNOSIS — Z791 Long term (current) use of non-steroidal anti-inflammatories (NSAID): Secondary | ICD-10-CM | POA: Diagnosis not present

## 2017-07-26 DIAGNOSIS — E669 Obesity, unspecified: Secondary | ICD-10-CM | POA: Diagnosis present

## 2017-07-26 DIAGNOSIS — Z6826 Body mass index (BMI) 26.0-26.9, adult: Secondary | ICD-10-CM | POA: Diagnosis not present

## 2017-07-26 DIAGNOSIS — G2581 Restless legs syndrome: Secondary | ICD-10-CM | POA: Diagnosis present

## 2017-07-26 DIAGNOSIS — N312 Flaccid neuropathic bladder, not elsewhere classified: Secondary | ICD-10-CM | POA: Diagnosis not present

## 2017-07-26 DIAGNOSIS — E46 Unspecified protein-calorie malnutrition: Secondary | ICD-10-CM | POA: Diagnosis not present

## 2017-07-26 DIAGNOSIS — Z981 Arthrodesis status: Secondary | ICD-10-CM

## 2017-07-26 DIAGNOSIS — Z87891 Personal history of nicotine dependence: Secondary | ICD-10-CM

## 2017-07-26 DIAGNOSIS — Z885 Allergy status to narcotic agent status: Secondary | ICD-10-CM

## 2017-07-26 DIAGNOSIS — Z880 Allergy status to penicillin: Secondary | ICD-10-CM

## 2017-07-26 DIAGNOSIS — Z825 Family history of asthma and other chronic lower respiratory diseases: Secondary | ICD-10-CM | POA: Diagnosis not present

## 2017-07-26 DIAGNOSIS — M1990 Unspecified osteoarthritis, unspecified site: Secondary | ICD-10-CM | POA: Diagnosis present

## 2017-07-26 DIAGNOSIS — Z419 Encounter for procedure for purposes other than remedying health state, unspecified: Secondary | ICD-10-CM

## 2017-07-26 DIAGNOSIS — E1142 Type 2 diabetes mellitus with diabetic polyneuropathy: Secondary | ICD-10-CM | POA: Diagnosis not present

## 2017-07-26 DIAGNOSIS — I10 Essential (primary) hypertension: Secondary | ICD-10-CM | POA: Diagnosis present

## 2017-07-26 DIAGNOSIS — Z8542 Personal history of malignant neoplasm of other parts of uterus: Secondary | ICD-10-CM

## 2017-07-26 DIAGNOSIS — M7989 Other specified soft tissue disorders: Secondary | ICD-10-CM | POA: Diagnosis not present

## 2017-07-26 DIAGNOSIS — M4327 Fusion of spine, lumbosacral region: Secondary | ICD-10-CM | POA: Diagnosis not present

## 2017-07-26 DIAGNOSIS — Z91013 Allergy to seafood: Secondary | ICD-10-CM | POA: Diagnosis not present

## 2017-07-26 DIAGNOSIS — K592 Neurogenic bowel, not elsewhere classified: Secondary | ICD-10-CM | POA: Diagnosis not present

## 2017-07-26 DIAGNOSIS — M5416 Radiculopathy, lumbar region: Secondary | ICD-10-CM | POA: Diagnosis not present

## 2017-07-26 DIAGNOSIS — Z7984 Long term (current) use of oral hypoglycemic drugs: Secondary | ICD-10-CM

## 2017-07-26 DIAGNOSIS — M62838 Other muscle spasm: Secondary | ICD-10-CM | POA: Diagnosis not present

## 2017-07-26 DIAGNOSIS — E785 Hyperlipidemia, unspecified: Secondary | ICD-10-CM | POA: Diagnosis present

## 2017-07-26 DIAGNOSIS — M5415 Radiculopathy, thoracolumbar region: Secondary | ICD-10-CM | POA: Diagnosis not present

## 2017-07-26 DIAGNOSIS — G8220 Paraplegia, unspecified: Secondary | ICD-10-CM | POA: Diagnosis not present

## 2017-07-26 DIAGNOSIS — Z4789 Encounter for other orthopedic aftercare: Secondary | ICD-10-CM | POA: Diagnosis not present

## 2017-07-26 DIAGNOSIS — Z8619 Personal history of other infectious and parasitic diseases: Secondary | ICD-10-CM | POA: Diagnosis not present

## 2017-07-26 DIAGNOSIS — M5117 Intervertebral disc disorders with radiculopathy, lumbosacral region: Secondary | ICD-10-CM | POA: Diagnosis present

## 2017-07-26 DIAGNOSIS — M4727 Other spondylosis with radiculopathy, lumbosacral region: Secondary | ICD-10-CM | POA: Diagnosis present

## 2017-07-26 DIAGNOSIS — K5903 Drug induced constipation: Secondary | ICD-10-CM | POA: Diagnosis not present

## 2017-07-26 DIAGNOSIS — G8918 Other acute postprocedural pain: Secondary | ICD-10-CM | POA: Diagnosis not present

## 2017-07-26 DIAGNOSIS — G709 Myoneural disorder, unspecified: Secondary | ICD-10-CM | POA: Diagnosis present

## 2017-07-26 DIAGNOSIS — Z87442 Personal history of urinary calculi: Secondary | ICD-10-CM

## 2017-07-26 DIAGNOSIS — Z79891 Long term (current) use of opiate analgesic: Secondary | ICD-10-CM

## 2017-07-26 DIAGNOSIS — E119 Type 2 diabetes mellitus without complications: Secondary | ICD-10-CM | POA: Diagnosis present

## 2017-07-26 DIAGNOSIS — Z79899 Other long term (current) drug therapy: Secondary | ICD-10-CM

## 2017-07-26 DIAGNOSIS — E1169 Type 2 diabetes mellitus with other specified complication: Secondary | ICD-10-CM

## 2017-07-26 DIAGNOSIS — K219 Gastro-esophageal reflux disease without esophagitis: Secondary | ICD-10-CM | POA: Diagnosis present

## 2017-07-26 DIAGNOSIS — D62 Acute posthemorrhagic anemia: Secondary | ICD-10-CM | POA: Diagnosis present

## 2017-07-26 DIAGNOSIS — Z90710 Acquired absence of both cervix and uterus: Secondary | ICD-10-CM

## 2017-07-26 DIAGNOSIS — N390 Urinary tract infection, site not specified: Secondary | ICD-10-CM | POA: Diagnosis not present

## 2017-07-26 DIAGNOSIS — E1149 Type 2 diabetes mellitus with other diabetic neurological complication: Secondary | ICD-10-CM | POA: Diagnosis not present

## 2017-07-26 DIAGNOSIS — M5417 Radiculopathy, lumbosacral region: Secondary | ICD-10-CM | POA: Diagnosis not present

## 2017-07-26 DIAGNOSIS — M5410 Radiculopathy, site unspecified: Secondary | ICD-10-CM | POA: Diagnosis not present

## 2017-07-26 DIAGNOSIS — M4714 Other spondylosis with myelopathy, thoracic region: Secondary | ICD-10-CM | POA: Diagnosis not present

## 2017-07-26 DIAGNOSIS — M5137 Other intervertebral disc degeneration, lumbosacral region: Secondary | ICD-10-CM

## 2017-07-26 HISTORY — PX: ANTERIOR LUMBAR FUSION: SHX1170

## 2017-07-26 HISTORY — PX: APPLICATION OF ROBOTIC ASSISTANCE FOR SPINAL PROCEDURE: SHX6753

## 2017-07-26 HISTORY — PX: ABDOMINAL EXPOSURE: SHX5708

## 2017-07-26 HISTORY — DX: Malignant neoplasm of uterus, part unspecified: C55

## 2017-07-26 LAB — GLUCOSE, CAPILLARY
Glucose-Capillary: 156 mg/dL — ABNORMAL HIGH (ref 65–99)
Glucose-Capillary: 181 mg/dL — ABNORMAL HIGH (ref 65–99)

## 2017-07-26 SURGERY — ANTERIOR LUMBAR FUSION 1 LEVEL
Anesthesia: General | Site: Back

## 2017-07-26 MED ORDER — VECURONIUM BROMIDE 10 MG IV SOLR
INTRAVENOUS | Status: AC
  Filled 2017-07-26: qty 10

## 2017-07-26 MED ORDER — DIPHENHYDRAMINE HCL 50 MG/ML IJ SOLN
INTRAMUSCULAR | Status: AC
  Filled 2017-07-26: qty 1

## 2017-07-26 MED ORDER — THROMBIN (RECOMBINANT) 5000 UNITS EX SOLR
CUTANEOUS | Status: AC
  Filled 2017-07-26: qty 5000

## 2017-07-26 MED ORDER — DOCUSATE SODIUM 100 MG PO CAPS
100.0000 mg | ORAL_CAPSULE | Freq: Two times a day (BID) | ORAL | Status: DC
  Administered 2017-07-26 – 2017-07-28 (×4): 100 mg via ORAL
  Filled 2017-07-26 (×4): qty 1

## 2017-07-26 MED ORDER — VITAMIN D3 25 MCG (1000 UNIT) PO TABS
2000.0000 [IU] | ORAL_TABLET | Freq: Every day | ORAL | Status: DC
  Administered 2017-07-27 – 2017-07-28 (×2): 2000 [IU] via ORAL
  Filled 2017-07-26 (×4): qty 2

## 2017-07-26 MED ORDER — SODIUM CHLORIDE 0.9 % IV SOLN: 250.0000 mL | INTRAVENOUS | Status: DC

## 2017-07-26 MED ORDER — BUPIVACAINE HCL (PF) 0.25 % IJ SOLN
INTRAMUSCULAR | Status: AC
  Filled 2017-07-26: qty 30

## 2017-07-26 MED ORDER — PROMETHAZINE HCL 25 MG/ML IJ SOLN: 6.2500 mg | INTRAMUSCULAR | Status: DC | PRN

## 2017-07-26 MED ORDER — METHOCARBAMOL 500 MG PO TABS
750.0000 mg | ORAL_TABLET | Freq: Four times a day (QID) | ORAL | Status: DC
  Administered 2017-07-26 (×2): 750 mg via ORAL
  Filled 2017-07-26 (×2): qty 2

## 2017-07-26 MED ORDER — PROPOFOL 500 MG/50ML IV EMUL
INTRAVENOUS | Status: DC | PRN
  Administered 2017-07-26: 50 ug/kg/min via INTRAVENOUS

## 2017-07-26 MED ORDER — HYDROCODONE-ACETAMINOPHEN 10-325 MG PO TABS
1.0000 | ORAL_TABLET | ORAL | Status: DC | PRN
  Administered 2017-07-26 – 2017-07-27 (×3): 1 via ORAL
  Administered 2017-07-28: 2 via ORAL
  Filled 2017-07-26 (×2): qty 1
  Filled 2017-07-26: qty 2
  Filled 2017-07-26: qty 1

## 2017-07-26 MED ORDER — ROCURONIUM BROMIDE 100 MG/10ML IV SOLN
INTRAVENOUS | Status: DC | PRN
  Administered 2017-07-26 (×2): 50 mg via INTRAVENOUS

## 2017-07-26 MED ORDER — LIDOCAINE HCL (CARDIAC) 20 MG/ML IV SOLN
INTRAVENOUS | Status: DC | PRN
  Administered 2017-07-26: 100 mg via INTRAVENOUS

## 2017-07-26 MED ORDER — LACTATED RINGERS IV SOLN
INTRAVENOUS | Status: DC | PRN
  Administered 2017-07-26 (×2): via INTRAVENOUS

## 2017-07-26 MED ORDER — HYDROMORPHONE HCL 1 MG/ML IJ SOLN
INTRAMUSCULAR | Status: AC
  Administered 2017-07-26: 0.5 mg via INTRAVENOUS
  Filled 2017-07-26: qty 1

## 2017-07-26 MED ORDER — GABAPENTIN 300 MG PO CAPS
300.0000 mg | ORAL_CAPSULE | Freq: Three times a day (TID) | ORAL | Status: DC
  Administered 2017-07-26 – 2017-07-28 (×5): 300 mg via ORAL
  Filled 2017-07-26 (×5): qty 1

## 2017-07-26 MED ORDER — 0.9 % SODIUM CHLORIDE (POUR BTL) OPTIME
TOPICAL | Status: DC | PRN
  Administered 2017-07-26: 1000 mL

## 2017-07-26 MED ORDER — THROMBIN (RECOMBINANT) 20000 UNITS EX SOLR
CUTANEOUS | Status: AC
  Filled 2017-07-26: qty 20000

## 2017-07-26 MED ORDER — BUPIVACAINE LIPOSOME 1.3 % IJ SUSP
20.0000 mL | INTRAMUSCULAR | Status: AC
  Administered 2017-07-26: 20 mL
  Filled 2017-07-26: qty 20

## 2017-07-26 MED ORDER — GARLIC 500 MG PO CAPS: 500.0000 mg | ORAL_CAPSULE | Freq: Two times a day (BID) | ORAL | Status: DC

## 2017-07-26 MED ORDER — SODIUM CHLORIDE 0.9 % IV SOLN
INTRAVENOUS | Status: DC
  Administered 2017-07-26: 18:00:00 via INTRAVENOUS

## 2017-07-26 MED ORDER — HYDROCODONE-ACETAMINOPHEN 5-325 MG PO TABS
1.0000 | ORAL_TABLET | Freq: Four times a day (QID) | ORAL | Status: DC | PRN
  Administered 2017-07-26: 1 via ORAL

## 2017-07-26 MED ORDER — DOCUSATE SODIUM 100 MG PO CAPS: 200.0000 mg | ORAL_CAPSULE | Freq: Every day | ORAL | Status: DC

## 2017-07-26 MED ORDER — PHENOL 1.4 % MT LIQD: 1.0000 | OROMUCOSAL | Status: DC | PRN

## 2017-07-26 MED ORDER — ACETAMINOPHEN 10 MG/ML IV SOLN
INTRAVENOUS | Status: DC | PRN
  Administered 2017-07-26: 1000 mg via INTRAVENOUS

## 2017-07-26 MED ORDER — LIDOCAINE-EPINEPHRINE 2 %-1:100000 IJ SOLN
INTRAMUSCULAR | Status: AC
  Filled 2017-07-26: qty 1

## 2017-07-26 MED ORDER — LISINOPRIL 10 MG PO TABS
10.0000 mg | ORAL_TABLET | Freq: Every day | ORAL | Status: DC
  Administered 2017-07-27 – 2017-07-28 (×2): 10 mg via ORAL
  Filled 2017-07-26 (×3): qty 1

## 2017-07-26 MED ORDER — ACETAMINOPHEN 500 MG PO TABS: 1000.0000 mg | ORAL_TABLET | Freq: Four times a day (QID) | ORAL | Status: DC

## 2017-07-26 MED ORDER — SODIUM CHLORIDE 0.9% FLUSH: 3.0000 mL | INTRAVENOUS | Status: DC | PRN

## 2017-07-26 MED ORDER — FENTANYL CITRATE (PF) 250 MCG/5ML IJ SOLN
INTRAMUSCULAR | Status: AC
  Filled 2017-07-26: qty 10

## 2017-07-26 MED ORDER — PANTOPRAZOLE SODIUM 40 MG PO TBEC
40.0000 mg | DELAYED_RELEASE_TABLET | Freq: Every day | ORAL | Status: DC
  Administered 2017-07-26 – 2017-07-28 (×3): 40 mg via ORAL
  Filled 2017-07-26 (×3): qty 1

## 2017-07-26 MED ORDER — ONDANSETRON HCL 4 MG/2ML IJ SOLN: 4.0000 mg | Freq: Four times a day (QID) | INTRAMUSCULAR | Status: DC | PRN

## 2017-07-26 MED ORDER — LIDOCAINE 2% (20 MG/ML) 5 ML SYRINGE
INTRAMUSCULAR | Status: AC
  Filled 2017-07-26: qty 5

## 2017-07-26 MED ORDER — CHLORHEXIDINE GLUCONATE 4 % EX LIQD: 60.0000 mL | Freq: Once | CUTANEOUS | Status: DC

## 2017-07-26 MED ORDER — DIPHENHYDRAMINE HCL 50 MG/ML IJ SOLN
INTRAMUSCULAR | Status: DC | PRN
Start: 2017-07-26 — End: 2017-07-26
  Administered 2017-07-26: 25 mg via INTRAVENOUS

## 2017-07-26 MED ORDER — SODIUM CHLORIDE 0.9% FLUSH: 3.0000 mL | Freq: Two times a day (BID) | INTRAVENOUS | Status: DC

## 2017-07-26 MED ORDER — ROCURONIUM BROMIDE 10 MG/ML (PF) SYRINGE
PREFILLED_SYRINGE | INTRAVENOUS | Status: AC
  Filled 2017-07-26: qty 5

## 2017-07-26 MED ORDER — HYDROMORPHONE HCL 1 MG/ML IJ SOLN: 1.0000 mg | INTRAMUSCULAR | Status: DC | PRN

## 2017-07-26 MED ORDER — PROPOFOL 10 MG/ML IV BOLUS
INTRAVENOUS | Status: AC
  Filled 2017-07-26: qty 40

## 2017-07-26 MED ORDER — PROPOFOL 10 MG/ML IV BOLUS
INTRAVENOUS | Status: DC | PRN
  Administered 2017-07-26 (×2): 50 mg via INTRAVENOUS
  Administered 2017-07-26: 150 mg via INTRAVENOUS
  Administered 2017-07-26 (×2): 50 mg via INTRAVENOUS

## 2017-07-26 MED ORDER — DIAZEPAM 5 MG PO TABS
5.0000 mg | ORAL_TABLET | Freq: Four times a day (QID) | ORAL | Status: DC | PRN
  Administered 2017-07-27: 5 mg via ORAL
  Filled 2017-07-26: qty 1

## 2017-07-26 MED ORDER — SODIUM CHLORIDE 0.9 % IR SOLN
Status: DC | PRN
  Administered 2017-07-26 (×2): 500 mL

## 2017-07-26 MED ORDER — BISACODYL 5 MG PO TBEC: 5.0000 mg | DELAYED_RELEASE_TABLET | Freq: Every day | ORAL | Status: DC | PRN

## 2017-07-26 MED ORDER — CHLORHEXIDINE GLUCONATE CLOTH 2 % EX PADS: 6.0000 | MEDICATED_PAD | Freq: Once | CUTANEOUS | Status: DC

## 2017-07-26 MED ORDER — PHENYLEPHRINE 40 MCG/ML (10ML) SYRINGE FOR IV PUSH (FOR BLOOD PRESSURE SUPPORT)
PREFILLED_SYRINGE | INTRAVENOUS | Status: AC
  Filled 2017-07-26: qty 10

## 2017-07-26 MED ORDER — MIDAZOLAM HCL 2 MG/2ML IJ SOLN
INTRAMUSCULAR | Status: AC
  Filled 2017-07-26: qty 2

## 2017-07-26 MED ORDER — HYDROMORPHONE HCL 1 MG/ML IJ SOLN
0.2500 mg | INTRAMUSCULAR | Status: DC | PRN
  Administered 2017-07-26 (×2): 0.5 mg via INTRAVENOUS

## 2017-07-26 MED ORDER — CELECOXIB 200 MG PO CAPS
200.0000 mg | ORAL_CAPSULE | Freq: Two times a day (BID) | ORAL | Status: DC
  Administered 2017-07-26 – 2017-07-28 (×4): 200 mg via ORAL
  Filled 2017-07-26 (×4): qty 1

## 2017-07-26 MED ORDER — MIDAZOLAM HCL 5 MG/5ML IJ SOLN
INTRAMUSCULAR | Status: DC | PRN
  Administered 2017-07-26: 2 mg via INTRAVENOUS

## 2017-07-26 MED ORDER — FENTANYL CITRATE (PF) 100 MCG/2ML IJ SOLN
INTRAMUSCULAR | Status: DC | PRN
  Administered 2017-07-26: 100 ug via INTRAVENOUS
  Administered 2017-07-26 (×3): 50 ug via INTRAVENOUS
  Administered 2017-07-26: 100 ug via INTRAVENOUS
  Administered 2017-07-26: 50 ug via INTRAVENOUS
  Administered 2017-07-26: 100 ug via INTRAVENOUS

## 2017-07-26 MED ORDER — BUPIVACAINE-EPINEPHRINE (PF) 0.5% -1:200000 IJ SOLN
INTRAMUSCULAR | Status: DC | PRN
  Administered 2017-07-26: 15 mL

## 2017-07-26 MED ORDER — SODIUM CHLORIDE 0.9 % IJ SOLN
INTRAMUSCULAR | Status: DC | PRN
  Administered 2017-07-26 (×2): 10 mL via INTRAVENOUS

## 2017-07-26 MED ORDER — DEXAMETHASONE SODIUM PHOSPHATE 10 MG/ML IJ SOLN
INTRAMUSCULAR | Status: AC
  Filled 2017-07-26: qty 1

## 2017-07-26 MED ORDER — LACTATED RINGERS IV SOLN
INTRAVENOUS | Status: DC | PRN
  Administered 2017-07-26: 07:00:00 via INTRAVENOUS

## 2017-07-26 MED ORDER — SCOPOLAMINE 1 MG/3DAYS TD PT72
1.0000 | MEDICATED_PATCH | TRANSDERMAL | Status: DC
  Administered 2017-07-26: 1.5 mg via TRANSDERMAL
  Filled 2017-07-26: qty 1

## 2017-07-26 MED ORDER — EPHEDRINE SULFATE 50 MG/ML IJ SOLN
INTRAMUSCULAR | Status: DC | PRN
  Administered 2017-07-26 (×2): 5 mg via INTRAVENOUS

## 2017-07-26 MED ORDER — SENNA 8.6 MG PO TABS
1.0000 | ORAL_TABLET | Freq: Two times a day (BID) | ORAL | Status: DC
  Administered 2017-07-26 – 2017-07-28 (×4): 8.6 mg via ORAL
  Filled 2017-07-26 (×4): qty 1

## 2017-07-26 MED ORDER — ACETAMINOPHEN 10 MG/ML IV SOLN
INTRAVENOUS | Status: AC
  Filled 2017-07-26: qty 100

## 2017-07-26 MED ORDER — DEXAMETHASONE SODIUM PHOSPHATE 10 MG/ML IJ SOLN
INTRAMUSCULAR | Status: DC | PRN
  Administered 2017-07-26: 10 mg via INTRAVENOUS

## 2017-07-26 MED ORDER — FLEET ENEMA 7-19 GM/118ML RE ENEM: 1.0000 | ENEMA | Freq: Once | RECTAL | Status: DC | PRN

## 2017-07-26 MED ORDER — LABETALOL HCL 5 MG/ML IV SOLN
INTRAVENOUS | Status: DC | PRN
  Administered 2017-07-26 (×4): 5 mg via INTRAVENOUS

## 2017-07-26 MED ORDER — GABAPENTIN 300 MG PO CAPS: 600.0000 mg | ORAL_CAPSULE | Freq: Three times a day (TID) | ORAL | Status: DC

## 2017-07-26 MED ORDER — MENTHOL 3 MG MT LOZG: 1.0000 | LOZENGE | OROMUCOSAL | Status: DC | PRN

## 2017-07-26 MED ORDER — BUPIVACAINE-EPINEPHRINE (PF) 0.5% -1:200000 IJ SOLN
INTRAMUSCULAR | Status: AC
  Filled 2017-07-26: qty 30

## 2017-07-26 MED ORDER — VANCOMYCIN HCL IN DEXTROSE 1-5 GM/200ML-% IV SOLN
1000.0000 mg | INTRAVENOUS | Status: AC
  Administered 2017-07-26: 1000 mg via INTRAVENOUS
  Filled 2017-07-26: qty 200

## 2017-07-26 MED ORDER — ONDANSETRON HCL 4 MG/2ML IJ SOLN
INTRAMUSCULAR | Status: DC | PRN
  Administered 2017-07-26: 4 mg via INTRAVENOUS

## 2017-07-26 MED ORDER — SUCCINYLCHOLINE CHLORIDE 20 MG/ML IJ SOLN
INTRAMUSCULAR | Status: DC | PRN
  Administered 2017-07-26: 120 mg via INTRAVENOUS

## 2017-07-26 MED ORDER — LIDOCAINE-EPINEPHRINE 2 %-1:100000 IJ SOLN
INTRAMUSCULAR | Status: DC | PRN
  Administered 2017-07-26: 15 mL

## 2017-07-26 MED ORDER — FENTANYL CITRATE (PF) 250 MCG/5ML IJ SOLN
INTRAMUSCULAR | Status: AC
  Filled 2017-07-26: qty 5

## 2017-07-26 MED ORDER — COQ10 100 MG PO CAPS: 100.0000 mg | ORAL_CAPSULE | Freq: Every day | ORAL | Status: DC

## 2017-07-26 MED ORDER — SODIUM CHLORIDE 0.9 % IJ SOLN
INTRAMUSCULAR | Status: AC
  Filled 2017-07-26: qty 20

## 2017-07-26 MED ORDER — PHENYLEPHRINE HCL 10 MG/ML IJ SOLN
INTRAMUSCULAR | Status: DC | PRN
  Administered 2017-07-26: 40 ug via INTRAVENOUS

## 2017-07-26 MED ORDER — SUCCINYLCHOLINE CHLORIDE 200 MG/10ML IV SOSY
PREFILLED_SYRINGE | INTRAVENOUS | Status: AC
  Filled 2017-07-26: qty 10

## 2017-07-26 MED ORDER — EPHEDRINE 5 MG/ML INJ
INTRAVENOUS | Status: AC
  Filled 2017-07-26: qty 10

## 2017-07-26 MED ORDER — ZOLPIDEM TARTRATE 5 MG PO TABS: 5.0000 mg | ORAL_TABLET | Freq: Every evening | ORAL | Status: DC | PRN

## 2017-07-26 MED ORDER — THROMBIN (RECOMBINANT) 5000 UNITS EX SOLR
OROMUCOSAL | Status: DC | PRN
  Administered 2017-07-26 (×2): 5 mL via TOPICAL

## 2017-07-26 MED ORDER — ALBUMIN HUMAN 5 % IV SOLN
INTRAVENOUS | Status: DC | PRN
  Administered 2017-07-26 (×2): via INTRAVENOUS

## 2017-07-26 MED ORDER — OXYBUTYNIN 3.9 MG/24HR TD PTTW: 1.0000 | MEDICATED_PATCH | TRANSDERMAL | Status: DC

## 2017-07-26 MED ORDER — HYDROCODONE-ACETAMINOPHEN 5-325 MG PO TABS
ORAL_TABLET | ORAL | Status: AC
  Administered 2017-07-26: 1 via ORAL
  Filled 2017-07-26: qty 1

## 2017-07-26 MED ORDER — ACETAMINOPHEN 325 MG PO TABS: 325.0000 mg | ORAL_TABLET | Freq: Four times a day (QID) | ORAL | Status: DC | PRN

## 2017-07-26 MED ORDER — PHENYLEPHRINE HCL 10 MG/ML IJ SOLN
INTRAVENOUS | Status: DC | PRN
  Administered 2017-07-26: 50 ug/min via INTRAVENOUS

## 2017-07-26 MED ORDER — SUGAMMADEX SODIUM 200 MG/2ML IV SOLN
INTRAVENOUS | Status: AC
  Filled 2017-07-26: qty 2

## 2017-07-26 MED ORDER — SUGAMMADEX SODIUM 200 MG/2ML IV SOLN
INTRAVENOUS | Status: DC | PRN
  Administered 2017-07-26: 200 mg via INTRAVENOUS

## 2017-07-26 MED ORDER — VANCOMYCIN HCL 1000 MG IV SOLR
INTRAVENOUS | Status: AC
Start: 2017-07-26 — End: 2017-07-26
  Filled 2017-07-26: qty 1000

## 2017-07-26 MED ORDER — METFORMIN HCL 500 MG PO TABS
500.0000 mg | ORAL_TABLET | Freq: Two times a day (BID) | ORAL | Status: DC
  Administered 2017-07-26 – 2017-07-28 (×4): 500 mg via ORAL
  Filled 2017-07-26 (×4): qty 1

## 2017-07-26 MED ORDER — ONDANSETRON HCL 4 MG/2ML IJ SOLN
INTRAMUSCULAR | Status: AC
  Filled 2017-07-26: qty 2

## 2017-07-26 MED ORDER — VANCOMYCIN HCL IN DEXTROSE 1-5 GM/200ML-% IV SOLN
1000.0000 mg | Freq: Two times a day (BID) | INTRAVENOUS | Status: DC
  Administered 2017-07-26 – 2017-07-28 (×4): 1000 mg via INTRAVENOUS
  Filled 2017-07-26 (×5): qty 200

## 2017-07-26 MED ORDER — ATORVASTATIN CALCIUM 20 MG PO TABS
20.0000 mg | ORAL_TABLET | Freq: Every evening | ORAL | Status: DC
  Administered 2017-07-26 – 2017-07-27 (×2): 20 mg via ORAL
  Filled 2017-07-26 (×2): qty 1

## 2017-07-26 MED ORDER — BIOTIN 5000 MCG PO CAPS: 5000.0000 ug | ORAL_CAPSULE | Freq: Every day | ORAL | Status: DC

## 2017-07-26 MED ORDER — VANCOMYCIN HCL POWD
Status: DC | PRN
  Administered 2017-07-26: 1000 mg

## 2017-07-26 MED ORDER — PANTOPRAZOLE SODIUM 40 MG IV SOLR: 40.0000 mg | Freq: Every day | INTRAVENOUS | Status: DC

## 2017-07-26 MED ORDER — ONDANSETRON HCL 4 MG PO TABS
4.0000 mg | ORAL_TABLET | Freq: Four times a day (QID) | ORAL | Status: DC | PRN
  Administered 2017-07-27: 4 mg via ORAL
  Filled 2017-07-26: qty 1

## 2017-07-26 MED ORDER — PHENYLEPHRINE HCL 10 MG/ML IJ SOLN: INTRAMUSCULAR | Status: DC | PRN

## 2017-07-26 MED ORDER — VECURONIUM BROMIDE 10 MG IV SOLR
INTRAVENOUS | Status: DC | PRN
  Administered 2017-07-26: 2 mg via INTRAVENOUS
  Administered 2017-07-26: 3 mg via INTRAVENOUS
  Administered 2017-07-26 (×2): 2 mg via INTRAVENOUS
  Administered 2017-07-26: 1 mg via INTRAVENOUS

## 2017-07-26 SURGICAL SUPPLY — 165 items
ADH SKN CLS APL DERMABOND .7 (GAUZE/BANDAGES/DRESSINGS) ×4
APL SKNCLS STERI-STRIP NONHPOA (GAUZE/BANDAGES/DRESSINGS) ×2
APPLIER CLIP 11 MED OPEN (CLIP) ×3
APR CLP MED 11 20 MLT OPN (CLIP) ×2
BAG DECANTER FOR FLEXI CONT (MISCELLANEOUS) ×3 IMPLANT
BENZOIN TINCTURE PRP APPL 2/3 (GAUZE/BANDAGES/DRESSINGS) ×3 IMPLANT
BIT DRILL LONG 3.0X30 (BIT) IMPLANT
BIT DRILL LONG 3X80 (BIT) IMPLANT
BIT DRILL LONG 4X80 (BIT) ×1 IMPLANT
BIT DRILL NEURO 2X3.1 SFT TUCH (MISCELLANEOUS) IMPLANT
BIT DRILL SHORT 3.0X30 (BIT) ×1 IMPLANT
BIT DRILL SHORT 3X80 (BIT) IMPLANT
BLADE CLIPPER SURG (BLADE) IMPLANT
BLADE SURG 11 STRL SS (BLADE) ×2 IMPLANT
BUR BARREL STRAIGHT FLUTE 4.0 (BURR) IMPLANT
BUR MATCHSTICK NEURO 3.0 LAGG (BURR) ×3 IMPLANT
BUR ROUND FLUTED 5 RND (BURR) ×1 IMPLANT
CAGE MEDIUM 10MX12 LUMBAR (Cage) ×1 IMPLANT
CANISTER SUCT 3000ML PPV (MISCELLANEOUS) ×6 IMPLANT
CARTRIDGE OIL MAESTRO DRILL (MISCELLANEOUS) ×4 IMPLANT
CHLORAPREP W/TINT 26ML (MISCELLANEOUS) ×6 IMPLANT
CLIP APPLIE 11 MED OPEN (CLIP) ×4 IMPLANT
COVER BACK TABLE 24X17X13 BIG (DRAPES) IMPLANT
COVER BACK TABLE 60X90IN (DRAPES) ×3 IMPLANT
DECANTER SPIKE VIAL GLASS SM (MISCELLANEOUS) ×6 IMPLANT
DERMABOND ADVANCED (GAUZE/BANDAGES/DRESSINGS) ×2
DERMABOND ADVANCED .7 DNX12 (GAUZE/BANDAGES/DRESSINGS) ×6 IMPLANT
DIFFUSER DRILL AIR PNEUMATIC (MISCELLANEOUS) ×5 IMPLANT
DRAPE C-ARM 42X72 X-RAY (DRAPES) ×12 IMPLANT
DRAPE C-ARMOR (DRAPES) ×3 IMPLANT
DRAPE INCISE IOBAN 66X45 STRL (DRAPES) ×3 IMPLANT
DRAPE LAPAROTOMY 100X72X124 (DRAPES) ×3 IMPLANT
DRAPE MICROSCOPE LEICA (MISCELLANEOUS) IMPLANT
DRAPE POUCH INSTRU U-SHP 10X18 (DRAPES) ×6 IMPLANT
DRAPE SHEET LG 3/4 BI-LAMINATE (DRAPES) ×3 IMPLANT
DRAPE SURG 17X23 STRL (DRAPES) ×3 IMPLANT
DRILL NEURO 2X3.1 SOFT TOUCH (MISCELLANEOUS)
DRSG OPSITE POSTOP 4X6 (GAUZE/BANDAGES/DRESSINGS) ×1 IMPLANT
DRSG OPSITE POSTOP 4X8 (GAUZE/BANDAGES/DRESSINGS) ×1 IMPLANT
ELECT BLADE 4.0 EZ CLEAN MEGAD (MISCELLANEOUS) ×3
ELECT COATED BLADE 2.86 ST (ELECTRODE) ×5 IMPLANT
ELECT REM PT RETURN 9FT ADLT (ELECTROSURGICAL) ×6
ELECTRODE BLDE 4.0 EZ CLN MEGD (MISCELLANEOUS) ×2 IMPLANT
ELECTRODE REM PT RTRN 9FT ADLT (ELECTROSURGICAL) ×4 IMPLANT
EVACUATOR 1/8 PVC DRAIN (DRAIN) ×1 IMPLANT
GAUZE SPONGE 4X4 12PLY STRL (GAUZE/BANDAGES/DRESSINGS) IMPLANT
GAUZE SPONGE 4X4 16PLY XRAY LF (GAUZE/BANDAGES/DRESSINGS) IMPLANT
GLOVE BIO SURGEON STRL SZ7 (GLOVE) IMPLANT
GLOVE BIO SURGEON STRL SZ7.5 (GLOVE) ×1 IMPLANT
GLOVE BIOGEL PI IND STRL 6.5 (GLOVE) IMPLANT
GLOVE BIOGEL PI IND STRL 7.0 (GLOVE) IMPLANT
GLOVE BIOGEL PI IND STRL 7.5 (GLOVE) ×6 IMPLANT
GLOVE BIOGEL PI IND STRL 8 (GLOVE) ×2 IMPLANT
GLOVE BIOGEL PI INDICATOR 6.5 (GLOVE) ×3
GLOVE BIOGEL PI INDICATOR 7.0 (GLOVE) ×2
GLOVE BIOGEL PI INDICATOR 7.5 (GLOVE) ×3
GLOVE BIOGEL PI INDICATOR 8 (GLOVE) ×4
GLOVE ECLIPSE 7.5 STRL STRAW (GLOVE) ×3 IMPLANT
GLOVE ECLIPSE 8.0 STRL XLNG CF (GLOVE) ×3 IMPLANT
GLOVE EXAM NITRILE LRG STRL (GLOVE) IMPLANT
GLOVE EXAM NITRILE XL STR (GLOVE) IMPLANT
GLOVE EXAM NITRILE XS STR PU (GLOVE) IMPLANT
GLOVE SS BIOGEL STRL SZ 7.5 (GLOVE) ×10 IMPLANT
GLOVE SUPERSENSE BIOGEL SZ 7.5 (GLOVE) ×5
GLOVE SURG SS PI 6.5 STRL IVOR (GLOVE) ×3 IMPLANT
GOWN STRL REUS W/ TWL LRG LVL3 (GOWN DISPOSABLE) ×10 IMPLANT
GOWN STRL REUS W/ TWL XL LVL3 (GOWN DISPOSABLE) IMPLANT
GOWN STRL REUS W/TWL 2XL LVL3 (GOWN DISPOSABLE) ×2 IMPLANT
GOWN STRL REUS W/TWL LRG LVL3 (GOWN DISPOSABLE) ×18
GOWN STRL REUS W/TWL XL LVL3 (GOWN DISPOSABLE)
HEMOSTAT POWDER KIT SURGIFOAM (HEMOSTASIS) ×3 IMPLANT
INSERT FOGARTY 61MM (MISCELLANEOUS) IMPLANT
INSERT FOGARTY SM (MISCELLANEOUS) IMPLANT
KIT BASIN OR (CUSTOM PROCEDURE TRAY) ×6 IMPLANT
KIT INFUSE SMALL (Orthopedic Implant) ×1 IMPLANT
KIT INFUSE XX SMALL 0.7CC (Orthopedic Implant) ×1 IMPLANT
KIT POSITION SURG JACKSON T1 (MISCELLANEOUS) ×3 IMPLANT
KIT ROOM TURNOVER OR (KITS) ×7 IMPLANT
KIT SPINE MAZOR X ROBO DISP (MISCELLANEOUS) ×3 IMPLANT
LOOP VESSEL MAXI BLUE (MISCELLANEOUS) IMPLANT
LOOP VESSEL MINI RED (MISCELLANEOUS) IMPLANT
MATRIX SPINE STRIP NEOCORE 5CC (Putty) IMPLANT
MILL MEDIUM DISP (BLADE) ×3 IMPLANT
NDL HYPO 18GX1.5 BLUNT FILL (NEEDLE) ×2 IMPLANT
NDL HYPO 21X1.5 SAFETY (NEEDLE) ×6 IMPLANT
NDL HYPO 25X1 1.5 SAFETY (NEEDLE) ×4 IMPLANT
NDL SPNL 18GX3.5 QUINCKE PK (NEEDLE) ×2 IMPLANT
NEEDLE HYPO 18GX1.5 BLUNT FILL (NEEDLE) ×3 IMPLANT
NEEDLE HYPO 21X1.5 SAFETY (NEEDLE) ×6 IMPLANT
NEEDLE HYPO 25X1 1.5 SAFETY (NEEDLE) ×6 IMPLANT
NEEDLE SPNL 18GX3.5 QUINCKE PK (NEEDLE) ×3 IMPLANT
NS IRRIG 1000ML POUR BTL (IV SOLUTION) ×6 IMPLANT
OIL CARTRIDGE MAESTRO DRILL (MISCELLANEOUS) ×3
PACK LAMINECTOMY NEURO (CUSTOM PROCEDURE TRAY) ×6 IMPLANT
PACK UNIVERSAL I (CUSTOM PROCEDURE TRAY) ×5 IMPLANT
PAD ARMBOARD 7.5X6 YLW CONV (MISCELLANEOUS) ×15 IMPLANT
PATTIES SURGICAL .5X1.5 (GAUZE/BANDAGES/DRESSINGS) ×2 IMPLANT
PIN HEAD 2.5X60MM (PIN) IMPLANT
PLATE ALIF SPINAL 14MM (Plate) ×1 IMPLANT
ROD PC 5.5X90 TI ARSENAL (Rod) ×2 IMPLANT
RUBBERBAND STERILE (MISCELLANEOUS) IMPLANT
SCREW 25MM (Screw) ×6 IMPLANT
SCREW 30MM (Screw) ×2 IMPLANT
SCREW ARSENAL POLY 8.5X90MM (Screw) ×2 IMPLANT
SCREW BN 25X6XNS SPNE ALIF (Screw) IMPLANT
SCREW PA 6.5X45 ARSENAL (Screw) ×2 IMPLANT
SCREW PA 6.5X50 ARSENAL (Screw) ×2 IMPLANT
SCREW PA 7.5X40 ARSENAL (Screw) ×2 IMPLANT
SCREW SCHANZ SA 4.0MM (MISCELLANEOUS) ×1 IMPLANT
SCREW SET SPINAL ARSENAL 47127 (Screw) ×8 IMPLANT
SEALER BIPOLAR AQUA 2.3 (INSTRUMENTS) ×1 IMPLANT
SPONGE INTESTINAL PEANUT (DISPOSABLE) ×10 IMPLANT
SPONGE LAP 18X18 X RAY DECT (DISPOSABLE) ×3 IMPLANT
SPONGE LAP 4X18 X RAY DECT (DISPOSABLE) IMPLANT
SPONGE NEURO XRAY DETECT 1X3 (DISPOSABLE) ×2 IMPLANT
SPONGE SURGIFOAM ABS GEL 100 (HEMOSTASIS) ×2 IMPLANT
SPONGE SURGIFOAM ABS GEL SZ50 (HEMOSTASIS) IMPLANT
STAPLER VISISTAT (STAPLE) ×1 IMPLANT
STRIP MATRIX NEOCORE 5CC (Putty) ×2 IMPLANT
STRIP SURGICAL 1 X 6 IN (GAUZE/BANDAGES/DRESSINGS) IMPLANT
STRIP SURGICAL 1/2 X 6 IN (GAUZE/BANDAGES/DRESSINGS) IMPLANT
STRIP SURGICAL 1/4 X 6 IN (GAUZE/BANDAGES/DRESSINGS) IMPLANT
STRIP SURGICAL 3/4 X 6 IN (GAUZE/BANDAGES/DRESSINGS) IMPLANT
SUT PDS AB 1 CTX 36 (SUTURE) ×1 IMPLANT
SUT PROLENE 4 0 RB 1 (SUTURE)
SUT PROLENE 4-0 RB1 .5 CRCL 36 (SUTURE) IMPLANT
SUT PROLENE 5 0 CC1 (SUTURE) IMPLANT
SUT PROLENE 6 0 C 1 30 (SUTURE) ×2 IMPLANT
SUT PROLENE 6 0 CC (SUTURE) IMPLANT
SUT SILK 0 TIES 10X30 (SUTURE) ×2 IMPLANT
SUT SILK 2 0 TIES 10X30 (SUTURE) ×2 IMPLANT
SUT SILK 2 0 TIES 17X18 (SUTURE) ×3
SUT SILK 2 0SH CR/8 30 (SUTURE) IMPLANT
SUT SILK 2-0 18XBRD TIE BLK (SUTURE) ×2 IMPLANT
SUT SILK 3 0 TIES 10X30 (SUTURE) ×2 IMPLANT
SUT SILK 3 0SH CR/8 30 (SUTURE) IMPLANT
SUT STRATAFIX 1PDS 45CM VIOLET (SUTURE) ×1 IMPLANT
SUT STRATAFIX MNCRL+ 3-0 PS-2 (SUTURE) ×4
SUT STRATAFIX MONOCRYL 3-0 (SUTURE) ×2
SUT STRATAFIX SPIRAL + 2-0 (SUTURE) ×1 IMPLANT
SUT VIC AB 0 CT1 18XCR BRD8 (SUTURE) ×4 IMPLANT
SUT VIC AB 0 CT1 27 (SUTURE) ×3
SUT VIC AB 0 CT1 27XBRD ANBCTR (SUTURE) ×6 IMPLANT
SUT VIC AB 0 CT1 8-18 (SUTURE) ×3
SUT VIC AB 1 CT1 18XBRD ANBCTR (SUTURE) ×2 IMPLANT
SUT VIC AB 1 CT1 8-18 (SUTURE)
SUT VIC AB 2-0 CT1 18 (SUTURE) ×11 IMPLANT
SUT VIC AB 2-0 CT1 27 (SUTURE)
SUT VIC AB 2-0 CT1 27XBRD (SUTURE) ×2 IMPLANT
SUT VIC AB 2-0 CTB1 (SUTURE) ×2 IMPLANT
SUT VIC AB 3-0 SH 27 (SUTURE) ×3
SUT VIC AB 3-0 SH 27X BRD (SUTURE) ×2 IMPLANT
SUT VIC AB 3-0 SH 8-18 (SUTURE) ×6 IMPLANT
SUT VIC AB 4-0 PS2 27 (SUTURE) ×2 IMPLANT
SUTURE STRATFX MNCRL+ 3-0 PS-2 (SUTURE) IMPLANT
SYR 30ML LL (SYRINGE) ×9 IMPLANT
SYR 3ML LL SCALE MARK (SYRINGE) ×9 IMPLANT
SYR 5ML LL (SYRINGE) ×3 IMPLANT
TIP TROCAR NITINOL ILLICO 20 (INSTRUMENTS) ×8 IMPLANT
TOWEL GREEN STERILE (TOWEL DISPOSABLE) ×5 IMPLANT
TOWEL GREEN STERILE FF (TOWEL DISPOSABLE) ×5 IMPLANT
TRAP SPECIMEN MUCOUS 40CC (MISCELLANEOUS) ×2 IMPLANT
TRAY FOLEY W/METER SILVER 16FR (SET/KITS/TRAYS/PACK) ×3 IMPLANT
TUBE MAZOR SA REDUCTION (TUBING) ×1 IMPLANT
WATER STERILE IRR 1000ML POUR (IV SOLUTION) ×6 IMPLANT

## 2017-07-26 NOTE — Op Note (Signed)
    NAME: Megan [name]    MRN: [id number] DOB: 11-Jan-1951    DATE OF OPERATION: 07/26/2017  PREOP DIAGNOSIS:    Degenerative disc disease lumbosacral spine  POSTOP DIAGNOSIS:    Same  PROCEDURE:    Anterior retroperitoneal exposure of L5-S1  EXPOSURE SURGEON: Judeth Cornfield. Scot Dock, MD  SPINE SURGEON: Cyndy Freeze, MD  ANESTHESIA: General  EBL: Minimal  INDICATIONS:    ALECIA DOI is a 66 y.o. female who presents for spine stabilization.  I was asked to provide anterior retroperitoneal exposure of L5-S1.  FINDINGS:   Iliac arteries had mild calcific disease.  TECHNIQUE:   The patient was taken to the operating room and received a general anesthetic.  The level of the L5-S1 disc was marked under fluoroscopy.  The abdomen was prepped and draped in usual sterile fashion.  A transverse incision was made to the left of the midline dissection was carried down to the anterior rectus sheath was which was divided transversely.  The rectus sheath was mobilized superiorly and inferiorly.  Next the retroperitoneal space was entered laterally and dissection carried down to the psoas and then medially to expose the left external iliac artery.  The dissection was continued superiorly to the hypogastric artery and common iliac arteries up to the sacral promontory.  Using blunt dissection the left common iliac vein was mobilized.  The middle sacral vessel was ligated and then doubly clipped and divided.  This allowed exposure of the L5-S1 disc.  The Thompson retractor system was used to provide closure of the L5-S1 disc.  The remainder of the dictation is as dictated by Dr. Cyndy Freeze.  Deitra Mayo, MD, FACS [facility]  DATE OF DICTATION:   07/26/2017

## 2017-07-26 NOTE — Brief Op Note (Signed)
07/26/2017  1:54 PM  PATIENT:  Megan [name]  66 y.o. female  PRE-OPERATIVE DIAGNOSIS:  Lumbosacral spondylosis with radiculopathy  POST-OPERATIVE DIAGNOSIS:  Lumbosacral spondylosis with radiculopathy  PROCEDURE:  Procedure(s): Lumbar five-Sacral one Anterior lumbar interbody fusion with Dr. Deitra Mayo for approach (N/A) Lumbar five-Sacral one Laminectomy/Lumbar five-Sacral one Posterolateral fixation/fusion/addition of Lumbar four Pedicle screws/Pelvis fixation using Sacral two Alar iliac screws/Mazor (N/A) APPLICATION OF ROBOTIC ASSISTANCE FOR SPINAL PROCEDURE (N/A) ABDOMINAL EXPOSURE (N/A)  SURGEON:  Surgeon(s) and Role: Panel 1:    * Shirlyn Savin, Kevan Ny, MD - Primary    * Ashok Pall, MD - Assisting Panel 2:    * Angelia Mould, MD - Primary  PHYSICIAN ASSISTANT:   ASSISTANTS: Ashok Pall, MD  ANESTHESIA:   general  EBL:  300 mL   BLOOD ADMINISTERED:none  DRAINS: Medium hemovac    LOCAL MEDICATIONS USED:  MARCAINE    and LIDOCAINE   SPECIMEN:  No Specimen  DISPOSITION OF SPECIMEN:  N/A  COUNTS:  YES  TOURNIQUET:  * No tourniquets in log *  DICTATION: .Dragon Dictation  PLAN OF CARE: Admit to inpatient   PATIENT DISPOSITION:  PACU - hemodynamically stable.   Delay start of Pharmacological VTE agent (>24hrs) due to surgical blood loss or risk of bleeding: yes

## 2017-07-26 NOTE — Progress Notes (Signed)
Patient arrived to unit, oriented x4, oriented to unit/room. Vitals stable. Family at bedside. Pain stated 3/10. [place] CDI, hemovac charged. Continue to monitor patient.

## 2017-07-26 NOTE — Anesthesia Postprocedure Evaluation (Signed)
Anesthesia Post Note  Patient: AEVAH STANSBERY  Procedure(s) Performed: Lumbar five-Sacral one Anterior lumbar interbody fusion with Dr. Deitra Mayo for approach (N/A Back) Lumbar five-Sacral one Laminectomy/Lumbar five-Sacral one Posterolateral fixation/fusion/addition of Lumbar four Pedicle screws/Pelvis fixation using Sacral two Alar iliac screws/Mazor (N/A Back) APPLICATION OF ROBOTIC ASSISTANCE FOR SPINAL PROCEDURE (N/A Back) ABDOMINAL EXPOSURE (N/A )     Patient location during evaluation: PACU Anesthesia Type: General Level of consciousness: sedated Pain management: pain level controlled Vital Signs Assessment: post-procedure vital signs reviewed and stable Respiratory status: spontaneous breathing and respiratory function stable Cardiovascular status: stable Postop Assessment: no apparent nausea or vomiting Anesthetic complications: no    Last Vitals:  Vitals:   07/26/17 1615 07/26/17 1630  BP: (!) 148/79 (!) 160/82  Pulse: 82 87  Resp: 11 19  Temp:    SpO2: 96% 98%    Last Pain:  Vitals:   07/26/17 1515  TempSrc:   PainSc: 8                  Tahji [place] DANIEL

## 2017-07-26 NOTE — Interval H&P Note (Signed)
History and Physical Interval Note:  07/26/2017 7:24 AM  Megan [name]  has presented today for surgery, with the diagnosis of Lumbosacral spondylosis with radiculopathy  The various methods of treatment have been discussed with the patient and family. After consideration of risks, benefits and other options for treatment, the patient has consented to  Procedure(s) with comments: L5-S1 Anterior lumbar interbody fusion with Dr. Deitra Mayo for approach (N/A) - L5-S1 Anterior lumbar interbody fusion with Dr. Deitra Mayo for approach L5-S1 Laminectomy/L5-S1 Posterolateral fixation/fusion/addition of L4 Pedicle screws/Pelvis fixation using S2 Alar iliac screws/Mazor (N/A) - L5-S1 Laminectomy/L5-S1 Posterolateral fixation/fusion/addition of L4 Pedicle screws/Pelvis fixation using S2 Alar iliac screws/Mazor APPLICATION OF ROBOTIC ASSISTANCE FOR SPINAL PROCEDURE (N/A) ABDOMINAL EXPOSURE (N/A) as a surgical intervention .  The patient's history has been reviewed, patient examined, no change in status, stable for surgery.  I have reviewed the patient's chart and labs.  Questions were answered to the patient's satisfaction.     Deitra Mayo

## 2017-07-26 NOTE — Progress Notes (Signed)
Pharmacy Antibiotic Note  Megan [name] is a 66 y.o. female admitted on 07/26/2017 for lumbar fusion. Initiating post-op antibiotic prophylaxis as pt as hemovac in place.   Plan: -Vancomycin 1g/12h -Monitor renal fx, cultures VT as needed   Weight: 177 lb (80.3 kg)  Temp (24hrs), Avg:98.2 F (36.8 C), Min:97.6 F (36.4 C), Max:99 F (37.2 C)  Recent Labs  Lab 07/21/17 1524  WBC 5.7  CREATININE 0.85    Estimated Creatinine Clearance: 74.4 mL/min (by C-G formula based on SCr of 0.85 mg/dL).    Allergies  Allergen Reactions  . Shellfish Allergy Anaphylaxis, Swelling and Other (See Comments)    Tongue swells  . Oxycodone Nausea Only  . Penicillins Rash and Other (See Comments)    Has patient had a PCN reaction causing immediate rash, facial/tongue/throat swelling, SOB or lightheadedness with hypotension: Yes Has patient had a PCN reaction causing severe rash involving mucus membranes or skin necrosis: No Has patient had a PCN reaction that required hospitalization No Has patient had a PCN reaction occurring within the last 10 years: No If all of the above answers are "NO", then may proceed with Cephalosporin use.    Antimicrobials this admission: 11/13 vancomycin >  Dose adjustments this admission: N/A  Microbiology results: N/A   Harvel Quale 07/26/2017 5:40 PM

## 2017-07-26 NOTE — Transfer of Care (Signed)
Immediate Anesthesia Transfer of Care Note  Patient: Megan [name]  Procedure(s) Performed: Lumbar five-Sacral one Anterior lumbar interbody fusion with Dr. Deitra Mayo for approach (N/A Back) Lumbar five-Sacral one Laminectomy/Lumbar five-Sacral one Posterolateral fixation/fusion/addition of Lumbar four Pedicle screws/Pelvis fixation using Sacral two Alar iliac screws/Mazor (N/A Back) APPLICATION OF ROBOTIC ASSISTANCE FOR SPINAL PROCEDURE (N/A Back) ABDOMINAL EXPOSURE (N/A )  Patient Location: PACU  Anesthesia Type:General  Level of Consciousness: awake, alert , oriented and sedated  Airway & Oxygen Therapy: Patient Spontanous Breathing and Patient connected to nasal cannula oxygen  Post-op Assessment: Report given to RN, Post -op Vital signs reviewed and stable and Patient moving all extremities  Post vital signs: Reviewed and stable  Last Vitals:  Vitals:   07/26/17 0552  BP: (!) 173/79  Pulse: 75  Resp: 18  Temp: 36.7 C  SpO2: 98%    Last Pain:  Vitals:   07/26/17 0559  TempSrc:   PainSc: 2       Patients Stated Pain Goal: 3 (03/55/97 4163)  Complications: No apparent anesthesia complications

## 2017-07-26 NOTE — Anesthesia Procedure Notes (Signed)
Arterial Line Insertion Performed by: CRNA  Preanesthetic checklist: patient identified, IV checked, site marked, risks and benefits discussed, surgical consent, monitors and equipment checked, pre-op evaluation and timeout performed Right, radial was placed Catheter size: 20 G Hand hygiene performed , maximum sterile barriers used  and Seldinger technique used Allen's test indicative of satisfactory collateral circulation Attempts: 1 Procedure performed without using ultrasound guided technique. Ultrasound Notes:anatomy identified Following insertion, dressing applied. Post procedure assessment: normal  Patient tolerated the procedure well with no immediate complications.

## 2017-07-26 NOTE — Anesthesia Procedure Notes (Signed)
Procedure Name: Intubation Date/Time: 07/26/2017 7:54 AM Performed by: Scheryl Darter, CRNA Pre-anesthesia Checklist: Patient identified, Emergency Drugs available, Suction available and Patient being monitored Patient Re-evaluated:Patient Re-evaluated prior to induction Oxygen Delivery Method: Circle System Utilized Preoxygenation: Pre-oxygenation with 100% oxygen Induction Type: IV induction Ventilation: Mask ventilation without difficulty Laryngoscope Size: Miller and 2 Grade View: Grade I Tube type: Oral Tube size: 7.5 mm Number of attempts: 1 Airway Equipment and Method: Stylet and Oral airway Placement Confirmation: ETT inserted through vocal cords under direct vision,  positive ETCO2 and breath sounds checked- equal and bilateral Secured at: 23 cm Tube secured with: Tape Dental Injury: Teeth and Oropharynx as per pre-operative assessment

## 2017-07-26 NOTE — H&P (Signed)
CC:  No chief complaint on file. Left leg and hip pain; weakness  HPI: Megan [name] is a 66 y.o. female with multiple prior thoracolumbar surgeries who presents with a lumbar radiculopathy due to an L5-S1 disc herniation.  She is here today for an elective lumbar fusion.  She reports that her pain is worsening since I saw her in clinic.  PMH: Past Medical History:  Diagnosis Date  . Arthritis   . Borderline diabetic   . Cancer ([place])    UTERUS  . Dyspnea    W/ PHYS CONDITION   . GERD (gastroesophageal reflux disease)   . History of bronchitis 2005  . History of kidney stones   . History of shingles   . Hyperlipidemia    takes Fish Oil daily  . Hypertension   . Neuromuscular disorder (H[facility])    tingling toes  . 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 Pneumonia 2009  . PONV (postoperative nausea and vomiting)   . Restless leg   . Type 2 diabetes mellitus ([place])   . Weakness    numbness and tingling in both feet r/t back    PSH: Past Surgical History:  Procedure Laterality Date  . ABDOMINAL HYSTERECTOMY  1979  . blADder tacked   1991  . CERVICAL FUSION     2001  . HERNIA REPAIR  1991  . LUMBAR FUSION  2016   L4-5  . LUMBAR FUSION  2017   L2-3  . TONSILLECTOMY AND ADENOIDECTOMY  1959  . TUMOR EXCISION     WERTHIN'S TUMORS BOTH SIDES OF NECK    SH: Social History   Tobacco Use  . Smoking status: Former Smoker    Packs/day: 1.50    Types: Cigarettes    Last attempt to quit: 05/13/2006    Years since quitting: 11.2  . Smokeless tobacco: Never Used  Substance Use Topics  . Alcohol use: Yes    Alcohol/week: 0.0 oz    Comment: once in a while  . Drug use: No    MEDS: Prior to Admission medications   Medication Sig Start Date End Date Taking? Authorizing Provider  atorvastatin (LIPITOR) 20 MG tablet Take 1 tablet (20 mg total) by mouth every evening. 01/04/17  Yes Angiulli, Lavon Paganini, PA-C  Biotin 5000 MCG CAPS Take 5,000 mcg daily by mouth.   Yes  [provider]  cholecalciferol (VITAMIN D) 1000 units tablet Take 1 tablet (1,000 Units total) by mouth daily after lunch. Patient taking differently: Take 2,000 Units daily after lunch by mouth.  01/04/17  Yes Angiulli, Lavon Paganini, PA-C  Coenzyme Q10 (COQ10) 100 MG CAPS Take 100 mg daily by mouth.    Yes [provider]  docusate sodium (COLACE) 100 MG capsule Take 200 mg daily by mouth.   Yes [provider]  esomeprazole (NEXIUM 24HR) 20 MG capsule Take 20 mg daily at 12 noon by mouth.   Yes [provider]  gabapentin (NEURONTIN) 300 MG capsule Take 1 capsule (300 mg total) by mouth 3 (three) times daily. 07/13/17  Yes Jearld Fenton, NP  Garlic 884 MG CAPS Take 500 mg 2 (two) times daily by mouth.   Yes [provider]  HYDROcodone-acetaminophen (NORCO/VICODIN) 5-325 MG tablet Take 1 tablet every 6 (six) hours as needed by mouth for moderate pain.  07/05/17  Yes [provider]  ibuprofen (ADVIL,MOTRIN) 200 MG tablet Take 400-600 mg every 6 (six) hours as needed by mouth for headache or  moderate pain.   Yes [provider]  lisinopril (PRINIVIL,ZESTRIL) 10 MG tablet TAKE 1 TABLET(10 MG) BY MOUTH DAILY 05/24/17  Yes Pleas Koch, NP  metFORMIN (GLUCOPHAGE) 500 MG tablet Take 1 tablet (500 mg total) by mouth 2 (two) times daily with a meal. 04/20/17  Yes Pleas Koch, NP  methocarbamol (ROBAXIN) 750 MG tablet Take 1 tablet (750 mg total) by mouth 3 (three) times daily. 04/01/17  Yes Kirsteins, Luanna Salk, MD  oxybutynin (OXYTROL) 3.9 MG/24HR Place 1 patch See admin instructions onto the skin. Apply 1 patch to skin every 5 days   Yes [provider]  pantoprazole (PROTONIX) 40 MG tablet Take 1 tablet (40 mg total) by mouth daily. Patient not taking: Reported on 07/18/2017 01/05/17   Angiulli, Lavon Paganini, PA-C  senna-docusate (SENOKOT-S) 8.6-50 MG tablet Take 2 tablets by mouth 2 (two) times daily. Patient not taking:  Reported on 07/18/2017 01/04/17   Angiulli, Lavon Paganini, PA-C    ALLERGY: Allergies  Allergen Reactions  . Shellfish Allergy Anaphylaxis, Swelling and Other (See Comments)    Tongue swells  . Oxycodone Nausea Only  . Penicillins Rash and Other (See Comments)    Has patient had a PCN reaction causing immediate rash, facial/tongue/throat swelling, SOB or lightheadedness with hypotension: Yes Has patient had a PCN reaction causing severe rash involving mucus membranes or skin necrosis: No Has patient had a PCN reaction that required hospitalization No Has patient had a PCN reaction occurring within the last 10 years: No If all of the above answers are "NO", then may proceed with Cephalosporin use.    ROS: ROS  NEUROLOGIC EXAM: Awake, alert, oriented Memory and concentration grossly intact Speech fluent, appropriate CN grossly intact Motor exam: Upper Extremities Deltoid Bicep Tricep Grip  Right 5/5 5/5 5/5 5/5  Left 5/5 5/5 5/5 5/5   Lower Extremity IP Quad PF DF EHL  Right 3/5 4/5 4/5 4/5 4/5  Left 4/5 4/5 4/5 2/5 2/5   Sensation grossly intact to LT  IMAGING: No new imaging  IMPRESSION: - 66 y.o. female with lumbosacral spondylosis and a disc herniation with radiculopathy.  She has neurological deficits as described above.  PLAN: - L5-S1 ALIF, L5-S1 posterior instrumented fusion with pelvic fixation - We have reviewed the risks, benefits, and alternatives to surgery and she wishes to proceed.

## 2017-07-27 DIAGNOSIS — G8918 Other acute postprocedural pain: Secondary | ICD-10-CM

## 2017-07-27 DIAGNOSIS — D62 Acute posthemorrhagic anemia: Secondary | ICD-10-CM

## 2017-07-27 DIAGNOSIS — I10 Essential (primary) hypertension: Secondary | ICD-10-CM

## 2017-07-27 DIAGNOSIS — M4727 Other spondylosis with radiculopathy, lumbosacral region: Secondary | ICD-10-CM

## 2017-07-27 DIAGNOSIS — E669 Obesity, unspecified: Secondary | ICD-10-CM

## 2017-07-27 DIAGNOSIS — M4327 Fusion of spine, lumbosacral region: Secondary | ICD-10-CM

## 2017-07-27 DIAGNOSIS — E1169 Type 2 diabetes mellitus with other specified complication: Secondary | ICD-10-CM

## 2017-07-27 LAB — CBC
HEMATOCRIT: 27.5 % — AB (ref 36.0–46.0)
HEMOGLOBIN: 9.2 g/dL — AB (ref 12.0–15.0)
MCH: 29.9 pg (ref 26.0–34.0)
MCHC: 33.5 g/dL (ref 30.0–36.0)
MCV: 89.3 fL (ref 78.0–100.0)
Platelets: 168 10*3/uL (ref 150–400)
RBC: 3.08 MIL/uL — AB (ref 3.87–5.11)
RDW: 13.2 % (ref 11.5–15.5)
WBC: 7.9 10*3/uL (ref 4.0–10.5)

## 2017-07-27 LAB — BASIC METABOLIC PANEL
Anion gap: 7 (ref 5–15)
BUN: 8 mg/dL (ref 6–20)
CHLORIDE: 107 mmol/L (ref 101–111)
CO2: 25 mmol/L (ref 22–32)
Calcium: 8.7 mg/dL — ABNORMAL LOW (ref 8.9–10.3)
Creatinine, Ser: 0.85 mg/dL (ref 0.44–1.00)
GFR calc Af Amer: >60 mL/min (ref >60)
GFR calc non Af Amer: >60 mL/min (ref >60)
Glucose, Bld: 135 mg/dL — ABNORMAL HIGH (ref 65–99)
POTASSIUM: 4.6 mmol/L (ref 3.5–5.1)
SODIUM: 139 mmol/L (ref 135–145)

## 2017-07-27 LAB — GLUCOSE, CAPILLARY
GLUCOSE-CAPILLARY: 211 mg/dL — AB (ref 65–99)
Glucose-Capillary: 136 mg/dL — ABNORMAL HIGH (ref 65–99)
Glucose-Capillary: 169 mg/dL — ABNORMAL HIGH (ref 65–99)
Glucose-Capillary: 193 mg/dL — ABNORMAL HIGH (ref 65–99)

## 2017-07-27 MED ORDER — BACLOFEN 10 MG PO TABS
10.0000 mg | ORAL_TABLET | Freq: Three times a day (TID) | ORAL | Status: DC
  Administered 2017-07-27 – 2017-07-28 (×4): 10 mg via ORAL
  Filled 2017-07-27 (×4): qty 1

## 2017-07-27 MED FILL — Vancomycin HCl For IV Soln 1 GM (Base Equivalent): INTRAVENOUS | Qty: 1000 | Status: AC

## 2017-07-27 MED FILL — Thrombin (Recombinant) For Soln 5000 Unit: CUTANEOUS | Qty: 5000 | Status: AC

## 2017-07-27 MED FILL — Sodium Chloride IV Soln 0.9%: INTRAVENOUS | Qty: 1000 | Status: AC

## 2017-07-27 MED FILL — Heparin Sodium (Porcine) Inj 1000 Unit/ML: INTRAMUSCULAR | Qty: 30 | Status: AC

## 2017-07-27 NOTE — Evaluation (Signed)
Physical Therapy Evaluation Patient Details Name: Megan [name] MRN: [id number] DOB: 1951/08/24 Today's Date: 07/27/2017   History of Present Illness  Pt is a 66 y.o. female s/p L5-S1 ALIF, L5-S1 Laminectomy, L5-S1 Posterolateral fixation/fusion. PMHx: Arthritis, GERD, Hx of shingles, Hyperlipidemia, DM 2.  Clinical Impression  Patient seen for mobility assessment s/p spinal surgery.  Educated patient on precautions, mobility expectations, and safety. Patient demonstrates deficits in functional mobility as indicated below. Will benefit from continued skilled PT to address deficits and maximize function. Will see as indicated and progress as tolerated.  Recommend CIR consult given current level of impairments and need for comprehensive therapies.     Follow Up Recommendations CIR    Equipment Recommendations  None recommended by PT    Recommendations for Other Services Rehab consult     Precautions / Restrictions Precautions Precautions: Back;Fall Precaution Comments: Verbally reviewed with patient Required Braces or Orthoses: Spinal Brace Spinal Brace: Lumbar corset;Applied in sitting position      Mobility  Bed Mobility Overal bed mobility: Needs Assistance Bed Mobility: Rolling;Sidelying to Sit Rolling: Min assist Sidelying to sit: Mod assist;+2 for physical assistance       General bed mobility comments: VCs for technique, min assist to complete roll to sidelying, moderate assist of 2 persons to elevate trunk to upright positioning  Transfers Overall transfer level: Needs assistance Equipment used: Rolling walker (2 wheeled) Transfers: Sit to/from [facility] Sit to Stand: Mod assist;+2 physical assistance;From elevated surface Stand pivot transfers: Max assist;+2 physical assistance;From elevated surface       General transfer comment: Patient required increased physical assist to power up to standing with gait belt and chuck pad as well as +2  assist, max assist during pivot to chair. Noted RLE fasiculations and clonus impacting ability to step with RLE. Heavy relaince on RW for support. Patient very anxious during transition.   Ambulation/Gait             General Gait Details: unable to perform today   Stairs            Wheelchair Mobility    Modified Rankin (Stroke Patients Only)       Balance Overall balance assessment: Needs assistance Sitting-balance support: Bilateral upper extremity supported;Feet supported Sitting balance-Leahy Scale: Fair Sitting balance - Comments: unable to reach outside of BOS utilized UE on bed to brace trunk due to pain   Standing balance support: During functional activity;Bilateral upper extremity supported Standing balance-Leahy Scale: Poor Standing balance comment: moderate to max assist for stability, significant reliance on Rw for UE support                             Pertinent Vitals/Pain      Home Living Family/patient expects to be discharged to:: Private residence Living Arrangements: Spouse/significant other Available Help at Discharge: Family;Available 24 hours/day Type of Home: House Home Access: Ramped entrance     Home Layout: One level Home Equipment: Clinical cytogeneticist - 2 wheels;Cane - single point;Walker - 4 wheels;Grab bars - tub/shower Additional Comments: Spouse works but runs his own company and can come and go as needed.     Prior Function Level of Independence: Needs assistance   Gait / Transfers Assistance Needed: Pt has had progressive weakness over past few months - for past 3 weeks, has required scooter for household and community amb secondary to BLE weakness; requires intermittent assist from husband to transfer to/from. Prior  to this, she required RW for amb.   ADL's / Homemaking Assistance Needed: Husband performs cooking/cleaning; provides intermittent assist for dressing, bathing. Pt indep with pericare.  Comments: using  scooter for mobility around the house. Spouse providing intermittent assist with ADLs. Prior to onset of weakness pt was ambulatory with SPC.      Hand Dominance   Dominant Hand: Right    Extremity/Trunk Assessment   Upper Extremity Assessment Upper Extremity Assessment: Overall WFL for tasks assessed    Lower Extremity Assessment Lower Extremity Assessment: RLE deficits/detail;LLE deficits/detail RLE Deficits / Details: noted fasiculations with RLE RLE Sensation: decreased light touch RLE Coordination: decreased gross motor LLE Deficits / Details: noted weakness 2+/5 LLE Coordination: decreased fine motor;decreased gross motor       Communication      Cognition Arousal/Alertness: Awake/alert Behavior During Therapy: Anxious Overall Cognitive Status: Within Functional Limits for tasks assessed                                        General Comments      Exercises     Assessment/Plan    PT Assessment Patient needs continued PT services  PT Problem List Decreased strength;Decreased activity tolerance;Decreased balance;Decreased mobility;Decreased range of motion;Decreased coordination;Obesity;Pain;Impaired tone;Impaired sensation       PT Treatment Interventions DME instruction;Gait training;Stair training;Functional mobility training;Therapeutic activities;Therapeutic exercise;Balance training;Neuromuscular re-education;Patient/family education    PT Goals (Current goals can be found in the Care Plan section)  Acute Rehab PT Goals Patient Stated Goal: to get better and be able to walk PT Goal Formulation: With patient Time For Goal Achievement: 08/10/17 Potential to Achieve Goals: Good    Frequency Min 5X/week   Barriers to discharge        Co-evaluation PT/OT/SLP Co-Evaluation/Treatment: Yes Reason for Co-Treatment: Complexity of the patient's impairments (multi-system involvement);For patient/therapist safety PT goals addressed during  session: Mobility/safety with mobility OT goals addressed during session: ADL's and self-care       AM-PAC PT "6 Clicks" Daily Activity  Outcome Measure Difficulty turning over in bed (including adjusting bedclothes, sheets and blankets)?: Unable Difficulty moving from lying on back to sitting on the side of the bed? : Unable Difficulty sitting down on and standing up from a chair with arms (e.g., wheelchair, bedside commode, etc,.)?: Unable Help needed moving to and from a bed to chair (including a wheelchair)?: A Lot Help needed walking in hospital room?: A Lot Help needed climbing 3-5 steps with a railing? : Total 6 Click Score: 8    End of Session Equipment Utilized During Treatment: Gait belt;Back brace Activity Tolerance: Patient limited by pain Patient left: in chair;with call bell/phone within reach;with family/visitor present Nurse Communication: Mobility status PT Visit Diagnosis: Difficulty in walking, not elsewhere classified (R26.2)    Time: 3354-5625 PT Time Calculation (min) (ACUTE ONLY): 21 min   Charges:   PT Evaluation $PT Eval Moderate Complexity: 1 Mod     PT G Codes:        Alben Deeds, PT DPT  Board Certified Neurologic Specialist [place] 07/27/2017, 1:37 PM

## 2017-07-27 NOTE — Evaluation (Signed)
Occupational Therapy Evaluation Patient Details Name: Megan [name] MRN: [id number] DOB: 09/10/1951 Today's Date: 07/27/2017    History of Present Illness Pt is a 66 y.o. female s/p L5-S1 ALIF, L5-S1 Laminectomy, L5-S1 Posterolateral fixation/fusion. PMHx: Arthritis, GERD, Hx of shingles, Hyperlipidemia, DM 2.   Clinical Impression   Pt reports she was managing ADL with mod I PTA. Currently pt requires max assist +2 for stand pivot transfers, min assist for UB ADL in sitting, and max assist for LB ADL. Pt able to verbally recall 3/3 back precautions from previous sxs. Recommending CIR level therapies to maximize independence and safety with ADL and functional mobility prior to return home. Pt would benefit from continued skilled OT to address established goals.    Follow Up Recommendations  CIR;Supervision/Assistance - 24 hour    Equipment Recommendations  None recommended by OT    Recommendations for Other Services       Precautions / Restrictions Precautions Precautions: Back;Fall Precaution Comments: Verbally reviewed with patient Required Braces or Orthoses: Spinal Brace Spinal Brace: Lumbar corset;Applied in sitting position Restrictions Weight Bearing Restrictions: No      Mobility Bed Mobility Overal bed mobility: Needs Assistance Bed Mobility: Rolling;Sidelying to Sit Rolling: Min assist Sidelying to sit: Mod assist;+2 for physical assistance       General bed mobility comments: VCs for technique, min assist to complete roll to sidelying, moderate assist of 2 persons to elevate trunk to upright positioning  Transfers Overall transfer level: Needs assistance Equipment used: Rolling walker (2 wheeled) Transfers: Sit to/from [facility] Sit to Stand: Mod assist;+2 physical assistance;From elevated surface Stand pivot transfers: Max assist;+2 physical assistance;From elevated surface       General transfer comment: Patient required increased  physical assist to power up to standing with gait belt and chuck pad as well as +2 assist, max assist during pivot to chair. Noted RLE fasiculations and clonus impacting ability to step with RLE. Heavy relaince on RW for support. Patient very anxious during transition.     Balance Overall balance assessment: Needs assistance Sitting-balance support: Bilateral upper extremity supported;Feet supported Sitting balance-Leahy Scale: Fair Sitting balance - Comments: unable to reach outside of BOS utilized UE on bed to brace trunk due to pain   Standing balance support: During functional activity;Bilateral upper extremity supported Standing balance-Leahy Scale: Poor Standing balance comment: moderate to max assist for stability, significant reliance on Rw for UE support                           ADL either performed or assessed with clinical judgement   ADL Overall ADL's : Needs assistance/impaired Eating/Feeding: Set up;Sitting   Grooming: Min guard;Sitting;Wash/dry face   Upper Body Bathing: Minimal assistance;Sitting   Lower Body Bathing: Maximal assistance;Sit to/from stand   Upper Body Dressing : Minimal assistance;Sitting Upper Body Dressing Details (indicate cue type and reason): to don brace and gown Lower Body Dressing: Maximal assistance;Sit to/from stand Lower Body Dressing Details (indicate cue type and reason): to don socks Toilet Transfer: Maximal assistance;+2 for physical assistance;Stand-pivot;BSC;RW Toilet Transfer Details (indicate cue type and reason): Simulated by stand pivot EOB>chair         Functional mobility during ADLs: Maximal assistance;+2 for physical assistance;Rolling walker(for stand pivot only) General ADL Comments: Pt able to recall 3/3 back precautions     Vision         Perception     Praxis  Pertinent Vitals/Pain Pain Assessment: Faces Faces Pain Scale: Hurts whole lot Pain Location: back Pain Descriptors / Indicators:  Aching;Guarding;Grimacing Pain Intervention(s): Monitored during session;Limited activity within patient's tolerance;Repositioned;Patient requesting pain meds-RN notified;Premedicated before session     Hand Dominance Right   Extremity/Trunk Assessment Upper Extremity Assessment Upper Extremity Assessment: Overall WFL for tasks assessed   Lower Extremity Assessment Lower Extremity Assessment: Defer to PT evaluation  Cervical / Trunk Assessment Cervical / Trunk Assessment: Other exceptions Cervical / Trunk Exceptions: s/p multiple spine sxs   Communication Communication Communication: No difficulties   Cognition Arousal/Alertness: Awake/alert Behavior During Therapy: Anxious Overall Cognitive Status: Within Functional Limits for tasks assessed                                     General Comments       Exercises     Shoulder Instructions      Home Living Family/patient expects to be discharged to:: Private residence Living Arrangements: Spouse/significant other Available Help at Discharge: Family;Available 24 hours/day Type of Home: House Home Access: Ramped entrance     Home Layout: One level     Bathroom Shower/Tub: Tub/shower unit;Door;Curtain   Biochemist, clinical: Standard Bathroom Accessibility: Yes   Home Equipment: Clinical cytogeneticist - 2 wheels;Cane - single point;Walker - 4 wheels;Grab bars - tub/shower   Additional Comments: Spouse works but runs his own company and can come and go as needed.       Prior Functioning/Environment Level of Independence: Needs assistance  Gait / Transfers Assistance Needed: Pt has had progressive weakness over past few months - for past 3 weeks, has required scooter for household and community amb secondary to BLE weakness; requires intermittent assist from husband to transfer to/from. Prior to this, she required RW for amb.  ADL's / Homemaking Assistance Needed: Husband performs cooking/cleaning; provides  intermittent assist for dressing, bathing. Pt indep with pericare.   Comments: using scooter for mobility around the house. Spouse providing intermittent assist with ADLs. Prior to onset of weakness pt was ambulatory with SPC.         OT Problem List: Decreased strength;Decreased range of motion;Decreased activity tolerance;Impaired balance (sitting and/or standing);Decreased knowledge of use of DME or AE;Decreased knowledge of precautions;Impaired sensation;Impaired tone;Pain      OT Treatment/Interventions: Self-care/ADL training;Therapeutic exercise;Energy conservation;DME and/or AE instruction;Therapeutic activities;Patient/family education;Balance training    OT Goals(Current goals can be found in the care plan section) Acute Rehab OT Goals Patient Stated Goal: to get better and be able to walk OT Goal Formulation: With patient/family Time For Goal Achievement: 08/10/17 Potential to Achieve Goals: Good  OT Frequency: Min 2X/week   Barriers to D/C:            Co-evaluation PT/OT/SLP Co-Evaluation/Treatment: Yes Reason for Co-Treatment: Complexity of the patient's impairments (multi-system involvement);For patient/therapist safety PT goals addressed during session: Mobility/safety with mobility OT goals addressed during session: ADL's and self-care      AM-PAC PT "6 Clicks" Daily Activity     Outcome Measure Help from another person eating meals?: None Help from another person taking care of personal grooming?: A Little Help from another person toileting, which includes using toliet, bedpan, or urinal?: A Lot Help from another person bathing (including washing, rinsing, drying)?: A Lot Help from another person to put on and taking off regular upper body clothing?: A Little Help from another person to put on and taking off regular  lower body clothing?: A Lot 6 Click Score: 16   End of Session Equipment Utilized During Treatment: Gait belt;Rolling walker;Back brace Nurse  Communication: Mobility status;Patient requests pain meds  Activity Tolerance: Patient tolerated treatment well Patient left: in chair;with call bell/phone within reach;with family/visitor present  OT Visit Diagnosis: Unsteadiness on feet (R26.81);Other abnormalities of gait and mobility (R26.89);Muscle weakness (generalized) (M62.81);Pain Pain - part of body: (back)                Time: 1388-7195 OT Time Calculation (min): 20 min Charges:  OT General Charges $OT Visit: 1 Visit OT Evaluation $OT Eval Moderate Complexity: 1 Mod G-Codes:     Roxas Clymer A. Ulice Brilliant, M.S., OTR/L Pager: [place] 07/27/2017, 1:55 PM

## 2017-07-27 NOTE — Progress Notes (Addendum)
Inpatient Rehabilitation  Per PT request, patient was screened by Gunnar Fusi for appropriateness for an Inpatient Acute Rehab consult.  Discussed case with Dr. Cyndy Freeze, who will see how patient progresses and discuss if there are increased needs from baseline with patient and spouse.  Defer to Dr. Cyndy Freeze to determine appropriateness for an Inpatient Rehab consult.    Carmelia Roller., CCC/SLP Admission Coordinator  [place]  Cell [phone]

## 2017-07-27 NOTE — Progress Notes (Signed)
Late entry.  Patient was seen and examined earlier this morning.  She had an episode of dizziness with standing.  She attributes this to being on Narcotics and Valium.  Her dizziness had since resolved.  Her lower extremity strength has improved.  She continues to have a numbness down her legs, but we discussed this should hopefully improve on the not guaranteed.  Her labs are fairly stable.  We will trend her hemoglobin.  She was started on baclofen for her lower extremity spasms.

## 2017-07-27 NOTE — Progress Notes (Signed)
I will begin insurance authorization with [employer] for a possible inpt rehab admission pending their approval. I will follow up with pt and family tomorrow. [phone]

## 2017-07-27 NOTE — Progress Notes (Signed)
   VASCULAR SURGERY ASSESSMENT & PLAN:   1 Day Post-Op s/p: Anterior retroperitoneal exposure of L5-S1.  Palpable left posterior tibial pulse.  Vascular surgery will be available as needed.  SUBJECTIVE:   Pain well controlled.  PHYSICAL EXAM:   Vitals:   07/26/17 1645 07/26/17 1700 07/26/17 1728 07/26/17 1940  BP: (!) 154/84 (!) 163/87  (!) 119/48  Pulse: 87     Resp: 14 11 16    Temp:   99 F (37.2 C) 98.8 F (37.1 C)  TempSrc:   Oral Oral  SpO2: 97% 98%    Weight:       Palpable left dorsalis pedis pulse.  LABS:   Lab Results  Component Value Date   WBC 7.9 07/27/2017   HGB 9.2 (L) 07/27/2017   HCT 27.5 (L) 07/27/2017   MCV 89.3 07/27/2017   PLT 168 07/27/2017   Lab Results  Component Value Date   CREATININE 0.85 07/27/2017   No results found for: INR, PROTIME CBG (last 3)  Recent Labs    07/26/17 0555 07/26/17 1436  GLUCAP 156* 181*    PROBLEM LIST:    Active Problems:   Lumbosacral spondylosis with radiculopathy   CURRENT MEDS:   . atorvastatin  20 mg Oral QPM  . celecoxib  200 mg Oral Q12H  . cholecalciferol  2,000 Units Oral QPC lunch  . docusate sodium  100 mg Oral BID  . gabapentin  300 mg Oral TID  . lisinopril  10 mg Oral Daily  . metFORMIN  500 mg Oral BID WC  . methocarbamol  750 mg Oral QID  . pantoprazole  40 mg Oral Daily  . senna  1 tablet Oral BID  . sodium chloride flush  3 mL Intravenous Q12H    Deitra Mayo Beeper: [phone] Office: [phone] 07/27/2017

## 2017-07-27 NOTE — Consult Note (Signed)
Physical Medicine and Rehabilitation Consult Reason for Consult: Decreased functional mobility Referring Physician: Dr. Cyndy Freeze   HPI: Megan [name] is a 66 y.o. right handed female with history of hypertension, remote tobacco abuse, diabetes mellitus, multiple prior thoracic or lumbar surgeries and received inpatient rehabilitation services April 2018. Per chart review, husband, and patient, patient lives with spouse. One level home with ramped entrance. Spouse is self-employed and can assist as needed as well as assistance from a neighbor and her daughter. Patient with progressive weakness over the past few months has required scooter for household and community ambulation secondary to bilateral lower extremity weakness. She needed assistance with meals and bathing.  Admitted 07/26/2017 with progressive back pain and lower extremity weakness. X-rays and imaging revealed lumbosacral spondylosis with radiculopathy. Underwent L5-S1 anterior retroperitoneal exposure L5-S1 laminectomy, L5-S1 posterior lateral fixation with fusion 07/26/2017 per Dr. Cyndy Freeze. Hospital course pain management. Back brace when out of bed applied in sitting position. Acute blood loss anemia 9.2 and monitored. Physical and occupational therapy evaluations completed with recommendations of physical medicine rehabilitation consult.   Review of Systems  Constitutional: Negative for chills and fever.  HENT: Negative for hearing loss.   Eyes: Negative for blurred vision and double vision.  Respiratory: Negative for cough.        Shortness of breath with exertion  Cardiovascular: Positive for leg swelling. Negative for chest pain and palpitations.  Gastrointestinal: Positive for constipation. Negative for nausea and vomiting.       GERD  Genitourinary: Negative for dysuria, flank pain and hematuria.  Musculoskeletal: Positive for back pain, joint pain and myalgias.  Skin: Negative for rash.  Neurological: Positive for  tingling, sensory change, focal weakness and weakness. Negative for seizures.  All other systems reviewed and are negative.  Past Medical History:  Diagnosis Date  . Arthritis    "hands, back" (07/26/2017)  . Dyspnea    W/ PHYS CONDITION   . GERD (gastroesophageal reflux disease)   . History of bronchitis    "not since I quit smoking" (07/26/2017)  . History of kidney stones   . History of shingles   . Hyperlipidemia    takes Fish Oil daily  . Hypertension   . Neuromuscular disorder (HCC)    tingling toes  . 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 Pneumonia 2009  . PONV (postoperative nausea and vomiting)   . Restless leg   . Type 2 diabetes mellitus ([place])   . [place]terine cancer [place] 1979   S/P hysterectomy  . Weakness    numbness and tingling in both feet r/t back   Past Surgical History:  Procedure Laterality Date  . ANTERIOR CERVICAL DECOMP/DISCECTOMY FUSION  2001  . BACK SURGERY    . BLADDER SUSPENSION  1991   tack  . INGUINAL HERNIA REPAIR Right 1991  . LUMBAR FUSION  2016; 2017; 07/26/2017   L4-5; L2-3; L5-S1  . [place]  . TUMOR EXCISION     WERTHIN'S TUMORS BOTH SIDES OF NECK  . VAGINAL HYSTERECTOMY  1979   Family History  Problem Relation Age of Onset  . Diabetes Mother   . Dementia Mother   . Cirrhosis Mother        Non alcoholic  . COPD Father   . Diabetes Brother    Social History:  reports that she quit smoking about 11 years ago. Her smoking use included cigarettes. She has a 55.50 pack-year smoking history. she  has never used smokeless tobacco. She reports that she drinks alcohol. She reports that she does not use drugs. Allergies:  Allergies  Allergen Reactions  . Shellfish Allergy Anaphylaxis, Swelling and Other (See Comments)    Tongue swells  . Oxycodone Nausea Only  . Penicillins Rash and Other (See Comments)    Has patient had a PCN reaction causing immediate rash, facial/tongue/throat swelling, SOB  or lightheadedness with hypotension: Yes Has patient had a PCN reaction causing severe rash involving mucus membranes or skin necrosis: No Has patient had a PCN reaction that required hospitalization No Has patient had a PCN reaction occurring within the last 10 years: No If all of the above answers are "NO", then may proceed with Cephalosporin use.   Medications Prior to Admission  Medication Sig Dispense Refill  . atorvastatin (LIPITOR) 20 MG tablet Take 1 tablet (20 mg total) by mouth every evening. 90 tablet 3  . Biotin 5000 MCG CAPS Take 5,000 mcg daily by mouth.    . cholecalciferol (VITAMIN D) 1000 units tablet Take 1 tablet (1,000 Units total) by mouth daily after lunch. (Patient taking differently: Take 2,000 Units daily after lunch by mouth. ) 30 tablet 1  . Coenzyme Q10 (COQ10) 100 MG CAPS Take 100 mg daily by mouth.     . docusate sodium (COLACE) 100 MG capsule Take 200 mg daily by mouth.    . esomeprazole (NEXIUM 24HR) 20 MG capsule Take 20 mg daily at 12 noon by mouth.    . gabapentin (NEURONTIN) 300 MG capsule Take 1 capsule (300 mg total) by mouth 3 (three) times daily. 90 capsule 2  . Garlic 161 MG CAPS Take 500 mg 2 (two) times daily by mouth.    Marland Kitchen HYDROcodone-acetaminophen (NORCO/VICODIN) 5-325 MG tablet Take 1 tablet every 6 (six) hours as needed by mouth for moderate pain.   0  . ibuprofen (ADVIL,MOTRIN) 200 MG tablet Take 400-600 mg every 6 (six) hours as needed by mouth for headache or moderate pain.    Marland Kitchen lisinopril (PRINIVIL,ZESTRIL) 10 MG tablet TAKE 1 TABLET(10 MG) BY MOUTH DAILY 90 tablet 1  . metFORMIN (GLUCOPHAGE) 500 MG tablet Take 1 tablet (500 mg total) by mouth 2 (two) times daily with a meal. 180 tablet 2  . methocarbamol (ROBAXIN) 750 MG tablet Take 1 tablet (750 mg total) by mouth 3 (three) times daily. 90 tablet 2  . oxybutynin (OXYTROL) 3.9 MG/24HR Place 1 patch See admin instructions onto the skin. Apply 1 patch to skin every 5 days    . pantoprazole  (PROTONIX) 40 MG tablet Take 1 tablet (40 mg total) by mouth daily. (Patient not taking: Reported on 07/18/2017) 30 tablet 1  . senna-docusate (SENOKOT-S) 8.6-50 MG tablet Take 2 tablets by mouth 2 (two) times daily. (Patient not taking: Reported on 07/18/2017)      Home: Home Living Family/patient expects to be discharged to:: Private residence Living Arrangements: Spouse/significant other Available Help at Discharge: Family, Available 24 hours/day Type of Home: House Home Access: [place]: One level Bathroom Shower/Tub: Tub/shower unit, Door, Architectural technologist: Standard Bathroom Accessibility: Yes Home Equipment: Civil engineer, contracting, Environmental consultant - 2 wheels, Cane - single point, Environmental consultant - 4 wheels, Grab bars - tub/shower Additional Comments: Spouse works but runs his own company and can come and go as needed.   Functional History: Prior Function Level of Independence: Needs assistance Gait / Transfers Assistance Needed: Pt has had progressive weakness over past few months - for past 3  weeks, has required scooter for household and community amb secondary to BLE weakness; requires intermittent assist from husband to transfer to/from. Prior to this, she required RW for amb.  ADL's / Homemaking Assistance Needed: Husband performs cooking/cleaning; provides intermittent assist for dressing, bathing. Pt indep with pericare. Comments: using scooter for mobility around the house. Spouse providing intermittent assist with ADLs. Prior to onset of weakness pt was ambulatory with SPC.  Functional Status:  Mobility: Bed Mobility Overal bed mobility: Needs Assistance Bed Mobility: Rolling, Sidelying to Sit Rolling: Min assist Sidelying to sit: Mod assist, +2 for physical assistance General bed mobility comments: VCs for technique, min assist to complete roll to sidelying, moderate assist of 2 persons to elevate trunk to upright positioning Transfers Overall transfer level: Needs  assistance Equipment used: Rolling walker (2 wheeled) Transfers: Sit to/from Stand, [employer] Transfers Sit to Stand: Mod assist, +2 physical assistance, From elevated surface Stand pivot transfers: Max assist, +2 physical assistance, From elevated surface General transfer comment: Patient required increased physical assist to power up to standing with gait belt and chuck pad as well as +2 assist, max assist during pivot to chair. Noted RLE fasiculations and clonus impacting ability to step with RLE. Heavy relaince on RW for support. Patient very anxious during transition.  Ambulation/Gait General Gait Details: unable to perform today     ADL: ADL Overall ADL's : Needs assistance/impaired Eating/Feeding: Set up, Sitting Grooming: Min guard, Sitting, Wash/dry face Upper Body Bathing: Minimal assistance, Sitting Lower Body Bathing: Maximal assistance, Sit to/from stand Upper Body Dressing : Minimal assistance, Sitting Upper Body Dressing Details (indicate cue type and reason): to don brace and gown Lower Body Dressing: Maximal assistance, Sit to/from stand Lower Body Dressing Details (indicate cue type and reason): to don socks Toilet Transfer: Maximal assistance, +2 for physical assistance, Stand-pivot, BSC, RW Toilet Transfer Details (indicate cue type and reason): Simulated by stand pivot EOB>chair Functional mobility during ADLs: Maximal assistance, +2 for physical assistance, Rolling walker(for stand pivot only) General ADL Comments: Pt able to recall 3/3 back precautions  Cognition: Cognition Overall Cognitive Status: Within Functional Limits for tasks assessed Orientation Level: Oriented X4 Cognition Arousal/Alertness: Awake/alert Behavior During Therapy: Anxious Overall Cognitive Status: Within Functional Limits for tasks assessed  Blood pressure 136/66, pulse 93, temperature 98.5 F (36.9 C), temperature source Oral, resp. rate 16, weight 80.3 kg (177 lb), SpO2 98  %. Physical Exam  Vitals reviewed. Constitutional: She is oriented to person, place, and time. She appears well-developed.  Obese  HENT:  Head: Normocephalic and atraumatic.  Eyes: EOM are normal. Right eye exhibits no discharge. Left eye exhibits no discharge.  Neck: Normal range of motion. Neck supple. No thyromegaly present.  Cardiovascular: Normal rate, regular rhythm and normal heart sounds.  Respiratory: Effort normal and breath sounds normal. No respiratory distress.  GI: Soft. Bowel sounds are normal. She exhibits no distension.  Musculoskeletal: She exhibits edema. She exhibits no tenderness.  Neurological: She is alert and oriented to person, place, and time.  Motor: B/l UE 4+/5 proximal to distal LLE: 4/5 HF, KE, 4+/5 ADF/PF RLE: HF, KE 2/5, ADF/PF 4/5 Sensation diminished to light tough RLE DTRs 1+ RLE  Skin: Skin is warm and dry.  Back incision is dressed +Drain  Psychiatric: She has a normal mood and affect. Her behavior is normal. Thought content normal.    Results for orders placed or performed during the hospital encounter of 07/26/17 (from the past 24 hour(s))  Glucose, capillary  Status: Abnormal   Collection Time: 07/26/17  2:36 PM  Result Value Ref Range   Glucose-Capillary 181 (H) 65 - 99 mg/dL   Comment 1 Notify RN    Comment 2 Document in Chart   CBC     Status: Abnormal   Collection Time: 07/27/17  3:01 AM  Result Value Ref Range   WBC 7.9 4.0 - 10.5 K/uL   RBC 3.08 (L) 3.87 - 5.11 MIL/uL   Hemoglobin 9.2 (L) 12.0 - 15.0 g/dL   HCT 27.5 (L) 36.0 - 46.0 %   MCV 89.3 78.0 - 100.0 fL   MCH 29.9 26.0 - 34.0 pg   MCHC 33.5 30.0 - 36.0 g/dL   RDW 13.2 11.5 - 15.5 %   Platelets 168 150 - 400 K/uL  Basic Metabolic Panel     Status: Abnormal   Collection Time: 07/27/17  3:01 AM  Result Value Ref Range   Sodium 139 135 - 145 mmol/L   Potassium 4.6 3.5 - 5.1 mmol/L   Chloride 107 101 - 111 mmol/L   CO2 25 22 - 32 mmol/L   Glucose, Bld 135 (H) 65 -  99 mg/dL   BUN 8 6 - 20 mg/dL   Creatinine, Ser 0.85 0.44 - 1.00 mg/dL   Calcium 8.7 (L) 8.9 - 10.3 mg/dL   GFR calc non Af Amer >60 >60 mL/min   GFR calc Af Amer >60 >60 mL/min   Anion gap 7 5 - 15  Glucose, capillary     Status: Abnormal   Collection Time: 07/27/17  7:41 AM  Result Value Ref Range   Glucose-Capillary 136 (H) 65 - 99 mg/dL   Comment 1 Notify RN    Comment 2 Document in Chart   Glucose, capillary     Status: Abnormal   Collection Time: 07/27/17 11:44 AM  Result Value Ref Range   Glucose-Capillary 211 (H) 65 - 99 mg/dL   Comment 1 Notify RN    Comment 2 Document in Chart    Dg Lumbar Spine 2-3 Views  Result Date: 07/26/2017 CLINICAL DATA:  Lumbar spine surgery. EXAM: DG C-ARM GT 120 MIN; LUMBAR SPINE - 2-3 VIEW CONTRAST:  None. FLUOROSCOPY TIME:  Fluoroscopy Time:  None reported . COMPARISON:  CT 07/06/2017 . FINDINGS: Lumbar spine numbered as per prior CT . L2-L3, L3-L4, L4-L5 interbody fusion. L3 through S L3 through L scratched it L3 through S1 fusion. Anterior fusion L5-S1. Iliosacral effusion. Hardware intact. Anatomic alignment. IMPRESSION: Postsurgical changes lumbosacral spine. Anatomic alignment. Hardware intact. No acute abnormality. Electronically Signed   By: Marcello Moores  Register   On: 07/26/2017 15:32   Dg C-arm Gt 42 Min  Result Date: 07/26/2017 CLINICAL DATA:  Lumbar spine surgery. EXAM: DG C-ARM GT 120 MIN; LUMBAR SPINE - 2-3 VIEW CONTRAST:  None. FLUOROSCOPY TIME:  Fluoroscopy Time:  None reported . COMPARISON:  CT 07/06/2017 . FINDINGS: Lumbar spine numbered as per prior CT . L2-L3, L3-L4, L4-L5 interbody fusion. L3 through S L3 through L scratched it L3 through S1 fusion. Anterior fusion L5-S1. Iliosacral effusion. Hardware intact. Anatomic alignment. IMPRESSION: Postsurgical changes lumbosacral spine. Anatomic alignment. Hardware intact. No acute abnormality. Electronically Signed   By: Marcello Moores  Register   On: 07/26/2017 15:32   Dg Or Local  Abdomen  Addendum Date: 07/26/2017   ADDENDUM REPORT: 07/26/2017 10:42 ADDENDUM: I spoke with Dr. Cyndy Freeze, who stated that the object in question represents a small vascular clip. Electronically Signed   By: Marijo Conception, M.D.  On: 07/26/2017 10:42   Addendum Date: 07/26/2017   ADDENDUM REPORT: 07/26/2017 10:30 ADDENDUM: Second image was obtained and the large metallic optic described over the left upper quadrant on prior exam is no longer visualized. However, there is noted a current metallic density seen projected over the left sacrum on both images which may represent retained foreign body. These results were called by telephone at the time of interpretation on 07/26/2017 at 10:28 am to Carroll Kinds, RN, who verbally acknowledged these results and will contact the surgeon. Electronically Signed   By: Marijo Conception, M.D.   On: 07/26/2017 10:30   Result Date: 07/26/2017 CLINICAL DATA:  Instrument count. EXAM: OR LOCAL ABDOMEN COMPARISON:  Radiographs of October 27, 2016. FINDINGS: Status post surgical posterior fusion extending from L2-L5. Status post surgical anterior fusion of L5-S1. Phleboliths are noted in the pelvis. Large metallic object is seen projected over the left upper quadrant which most likely represents external object, but clinical correlation is recommended. No other foreign body is noted. IMPRESSION: Status post surgical fusion of lower lumbar spine. Large metallic object is seen projected over the left upper quadrant which most likely represents external object, but clinical correlation is recommended to rule out internal object. No other foreign body is noted. These results were called by telephone at the time of interpretation on 07/26/2017 at 10:09 am to Carroll Kinds, RN, who verbally acknowledged these results and will contact the surgeon. Electronically Signed: By: Marijo Conception, M.D. On: 07/26/2017 10:10    Assessment/Plan: Diagnosis: Lumbosacral fusion Labs  independently reviewed.  Records reviewed and summated above.  1. Does the need for close, 24 hr/day medical supervision in concert with the patient's rehab needs make it unreasonable for this patient to be served in a less intensive setting? Yes  2. Co-Morbidities requiring supervision/potential complications: Acute blood loss anemia (transfuse if necessary to ensure appropriate perfusion for increased activity tolerance), post-op pain management (Biofeedback training with therapies to help reduce reliance on opiate pain medications, monitor pain control during therapies, and sedation at rest and titrate to maximum efficacy to ensure participation and gains in therapies), HTN (monitor and provide prns in accordance with increased physical exertion and pain), remote tobacco abuse, DM (Monitor in accordance with exercise and adjust meds as necessary), multiple prior thoracic or lumbar surgeries, ID (narrow IV Vanc when appropriate) 3. Due to safety, skin/wound care, disease management, pain management and patient education, does the patient require 24 hr/day rehab nursing? Yes 4. Does the patient require coordinated care of a physician, rehab nurse, PT (1-2 hrs/day, 5 days/week) and OT (1-2 hrs/day, 5 days/week) to address physical and functional deficits in the context of the above medical diagnosis(es)? Yes Addressing deficits in the following areas: balance, endurance, locomotion, strength, transferring, bathing, dressing, toileting and psychosocial support 5. Can the patient actively participate in an intensive therapy program of at least 3 hrs of therapy per day at least 5 days per week? Yes 6. The potential for patient to make measurable gains while on inpatient rehab is excellent 7. Anticipated functional outcomes upon discharge from inpatient rehab are min assist  with PT - Mod I at wheelchair level for mobility, min assist with OT, n/a with SLP. 8. Estimated rehab length of stay to reach the above  functional goals is: 17-20 days. 9. Anticipated D/C setting: Home 10. Anticipated post D/C treatments: HH therapy and Home excercise program 11. Overall Rehab/Functional Prognosis: good and fair  RECOMMENDATIONS: This patient's condition is appropriate  for continued rehabilitative care in the following setting: CIR when medically appropriate Patient has agreed to participate in recommended program. Yes Note that insurance prior authorization may be required for reimbursement for recommended care.  Comment: Rehab Admissions Coordinator to follow up.  Delice Lesch, MD, ABPMR Lauraine Rinne J., PA-C 07/27/2017

## 2017-07-27 NOTE — Progress Notes (Signed)
Patient up to bsc with two person assist, walker, and gait belt.  After 2 minutes patient reports dizziness, nausea, and general fatigue.  Patient placed back into back. Spinal precautions maintained. PA, and MD aware patient tolerated poorly.

## 2017-07-28 ENCOUNTER — Other Ambulatory Visit: Payer: Self-pay

## 2017-07-28 ENCOUNTER — Encounter (HOSPITAL_COMMUNITY): Payer: Self-pay

## 2017-07-28 ENCOUNTER — Inpatient Hospital Stay (HOSPITAL_COMMUNITY)
Admission: RE | Admit: 2017-07-28 | Discharge: 2017-08-19 | DRG: 560 | Disposition: A | Discharge status: Home Health | Payer: BLUE CROSS/BLUE SHIELD | Source: Intra-hospital | Attending: Physical Medicine & Rehabilitation | Admitting: Physical Medicine & Rehabilitation

## 2017-07-28 DIAGNOSIS — E8809 Other disorders of plasma-protein metabolism, not elsewhere classified: Secondary | ICD-10-CM | POA: Diagnosis present

## 2017-07-28 DIAGNOSIS — I951 Orthostatic hypotension: Secondary | ICD-10-CM | POA: Diagnosis not present

## 2017-07-28 DIAGNOSIS — K5900 Constipation, unspecified: Secondary | ICD-10-CM | POA: Diagnosis present

## 2017-07-28 DIAGNOSIS — M5410 Radiculopathy, site unspecified: Secondary | ICD-10-CM

## 2017-07-28 DIAGNOSIS — Z885 Allergy status to narcotic agent status: Secondary | ICD-10-CM

## 2017-07-28 DIAGNOSIS — E1142 Type 2 diabetes mellitus with diabetic polyneuropathy: Secondary | ICD-10-CM | POA: Diagnosis present

## 2017-07-28 DIAGNOSIS — R110 Nausea: Secondary | ICD-10-CM

## 2017-07-28 DIAGNOSIS — R339 Retention of urine, unspecified: Secondary | ICD-10-CM | POA: Diagnosis not present

## 2017-07-28 DIAGNOSIS — M62838 Other muscle spasm: Secondary | ICD-10-CM | POA: Diagnosis not present

## 2017-07-28 DIAGNOSIS — M5415 Radiculopathy, thoracolumbar region: Secondary | ICD-10-CM | POA: Diagnosis not present

## 2017-07-28 DIAGNOSIS — Z7984 Long term (current) use of oral hypoglycemic drugs: Secondary | ICD-10-CM

## 2017-07-28 DIAGNOSIS — Z91013 Allergy to seafood: Secondary | ICD-10-CM | POA: Diagnosis not present

## 2017-07-28 DIAGNOSIS — I10 Essential (primary) hypertension: Secondary | ICD-10-CM | POA: Diagnosis present

## 2017-07-28 DIAGNOSIS — Z981 Arthrodesis status: Secondary | ICD-10-CM

## 2017-07-28 DIAGNOSIS — G8220 Paraplegia, unspecified: Secondary | ICD-10-CM | POA: Diagnosis present

## 2017-07-28 DIAGNOSIS — M7989 Other specified soft tissue disorders: Secondary | ICD-10-CM | POA: Diagnosis not present

## 2017-07-28 DIAGNOSIS — K592 Neurogenic bowel, not elsewhere classified: Secondary | ICD-10-CM | POA: Diagnosis present

## 2017-07-28 DIAGNOSIS — Z880 Allergy status to penicillin: Secondary | ICD-10-CM | POA: Diagnosis not present

## 2017-07-28 DIAGNOSIS — Z90710 Acquired absence of both cervix and uterus: Secondary | ICD-10-CM

## 2017-07-28 DIAGNOSIS — Z8542 Personal history of malignant neoplasm of other parts of uterus: Secondary | ICD-10-CM | POA: Diagnosis not present

## 2017-07-28 DIAGNOSIS — E46 Unspecified protein-calorie malnutrition: Secondary | ICD-10-CM

## 2017-07-28 DIAGNOSIS — M4714 Other spondylosis with myelopathy, thoracic region: Secondary | ICD-10-CM | POA: Diagnosis not present

## 2017-07-28 DIAGNOSIS — Z87891 Personal history of nicotine dependence: Secondary | ICD-10-CM

## 2017-07-28 DIAGNOSIS — M5416 Radiculopathy, lumbar region: Secondary | ICD-10-CM | POA: Diagnosis not present

## 2017-07-28 DIAGNOSIS — Z716 Tobacco abuse counseling: Secondary | ICD-10-CM

## 2017-07-28 DIAGNOSIS — M5417 Radiculopathy, lumbosacral region: Secondary | ICD-10-CM

## 2017-07-28 DIAGNOSIS — N312 Flaccid neuropathic bladder, not elsewhere classified: Secondary | ICD-10-CM | POA: Diagnosis not present

## 2017-07-28 DIAGNOSIS — Z79899 Other long term (current) drug therapy: Secondary | ICD-10-CM

## 2017-07-28 DIAGNOSIS — Z833 Family history of diabetes mellitus: Secondary | ICD-10-CM | POA: Diagnosis not present

## 2017-07-28 DIAGNOSIS — K5903 Drug induced constipation: Secondary | ICD-10-CM

## 2017-07-28 DIAGNOSIS — Z4789 Encounter for other orthopedic aftercare: Secondary | ICD-10-CM | POA: Diagnosis present

## 2017-07-28 DIAGNOSIS — G2581 Restless legs syndrome: Secondary | ICD-10-CM | POA: Diagnosis present

## 2017-07-28 DIAGNOSIS — D62 Acute posthemorrhagic anemia: Secondary | ICD-10-CM | POA: Diagnosis present

## 2017-07-28 DIAGNOSIS — E119 Type 2 diabetes mellitus without complications: Secondary | ICD-10-CM

## 2017-07-28 DIAGNOSIS — E785 Hyperlipidemia, unspecified: Secondary | ICD-10-CM | POA: Diagnosis present

## 2017-07-28 DIAGNOSIS — K219 Gastro-esophageal reflux disease without esophagitis: Secondary | ICD-10-CM | POA: Diagnosis present

## 2017-07-28 DIAGNOSIS — N390 Urinary tract infection, site not specified: Secondary | ICD-10-CM | POA: Diagnosis not present

## 2017-07-28 HISTORY — DX: Radiculopathy, site unspecified: M54.10

## 2017-07-28 LAB — CBC
HCT: 27.9 % — ABNORMAL LOW (ref 36.0–46.0)
Hemoglobin: 9.1 g/dL — ABNORMAL LOW (ref 12.0–15.0)
MCH: 29.4 pg (ref 26.0–34.0)
MCHC: 32.6 g/dL (ref 30.0–36.0)
MCV: 90 fL (ref 78.0–100.0)
Platelets: 144 10*3/uL — ABNORMAL LOW (ref 150–400)
RBC: 3.1 MIL/uL — ABNORMAL LOW (ref 3.87–5.11)
RDW: 13.3 % (ref 11.5–15.5)
WBC: 7 10*3/uL (ref 4.0–10.5)

## 2017-07-28 LAB — GLUCOSE, CAPILLARY
GLUCOSE-CAPILLARY: 233 mg/dL — AB (ref 65–99)
GLUCOSE-CAPILLARY: 159 mg/dL — AB (ref 65–99)
Glucose-Capillary: 163 mg/dL — ABNORMAL HIGH (ref 65–99)
Glucose-Capillary: 153 mg/dL — ABNORMAL HIGH (ref 65–99)

## 2017-07-28 MED ORDER — BACLOFEN 10 MG PO TABS: 10.0000 mg | ORAL_TABLET | Freq: Three times a day (TID) | ORAL | 0 refills | Status: DC

## 2017-07-28 MED ORDER — GABAPENTIN 300 MG PO CAPS
300.0000 mg | ORAL_CAPSULE | Freq: Three times a day (TID) | ORAL | Status: DC
  Administered 2017-07-28 – 2017-08-19 (×66): 300 mg via ORAL
  Filled 2017-07-28 (×68): qty 1

## 2017-07-28 MED ORDER — PANTOPRAZOLE SODIUM 40 MG PO TBEC
40.0000 mg | DELAYED_RELEASE_TABLET | Freq: Every day | ORAL | Status: DC
  Administered 2017-07-29 – 2017-08-19 (×22): 40 mg via ORAL
  Filled 2017-07-28 (×23): qty 1

## 2017-07-28 MED ORDER — BISACODYL 5 MG PO TBEC
5.0000 mg | DELAYED_RELEASE_TABLET | Freq: Every day | ORAL | Status: DC | PRN
  Administered 2017-07-28 – 2017-07-31 (×3): 5 mg via ORAL
  Filled 2017-07-28 (×3): qty 1

## 2017-07-28 MED ORDER — ONDANSETRON HCL 4 MG PO TABS
4.0000 mg | ORAL_TABLET | Freq: Four times a day (QID) | ORAL | Status: DC | PRN
  Administered 2017-07-31 – 2017-08-19 (×21): 4 mg via ORAL
  Filled 2017-07-28 (×21): qty 1

## 2017-07-28 MED ORDER — ONDANSETRON HCL 4 MG/2ML IJ SOLN: 4.0000 mg | Freq: Four times a day (QID) | INTRAMUSCULAR | Status: DC | PRN

## 2017-07-28 MED ORDER — LISINOPRIL 10 MG PO TABS
10.0000 mg | ORAL_TABLET | Freq: Every day | ORAL | Status: DC
  Administered 2017-07-29 – 2017-08-02 (×5): 10 mg via ORAL
  Filled 2017-07-28 (×5): qty 1

## 2017-07-28 MED ORDER — METFORMIN HCL 500 MG PO TABS
500.0000 mg | ORAL_TABLET | Freq: Two times a day (BID) | ORAL | Status: DC
  Administered 2017-07-28 – 2017-08-19 (×44): 500 mg via ORAL
  Filled 2017-07-28 (×45): qty 1

## 2017-07-28 MED ORDER — DOCUSATE SODIUM 100 MG PO CAPS
100.0000 mg | ORAL_CAPSULE | Freq: Two times a day (BID) | ORAL | Status: DC
  Administered 2017-07-28 – 2017-07-31 (×6): 100 mg via ORAL
  Filled 2017-07-28 (×6): qty 1

## 2017-07-28 MED ORDER — MENTHOL 3 MG MT LOZG
1.0000 | LOZENGE | OROMUCOSAL | Status: DC | PRN
  Filled 2017-07-28: qty 9

## 2017-07-28 MED ORDER — SENNA 8.6 MG PO TABS
1.0000 | ORAL_TABLET | Freq: Two times a day (BID) | ORAL | Status: DC
  Administered 2017-07-28 – 2017-07-31 (×6): 8.6 mg via ORAL
  Filled 2017-07-28 (×6): qty 1

## 2017-07-28 MED ORDER — CELECOXIB 200 MG PO CAPS
200.0000 mg | ORAL_CAPSULE | Freq: Two times a day (BID) | ORAL | Status: DC
  Administered 2017-07-28 – 2017-08-19 (×44): 200 mg via ORAL
  Filled 2017-07-28 (×46): qty 1

## 2017-07-28 MED ORDER — HYDROCODONE-ACETAMINOPHEN 5-325 MG PO TABS: 1.0000 | ORAL_TABLET | ORAL | 0 refills | Status: DC | PRN

## 2017-07-28 MED ORDER — BACLOFEN 10 MG PO TABS: 10.0000 mg | ORAL_TABLET | Freq: Three times a day (TID) | ORAL | Status: DC

## 2017-07-28 MED ORDER — METHOCARBAMOL 500 MG PO TABS: 500.0000 mg | ORAL_TABLET | Freq: Four times a day (QID) | ORAL | Status: DC | PRN

## 2017-07-28 MED ORDER — HYDROCODONE-ACETAMINOPHEN 5-325 MG PO TABS
1.0000 | ORAL_TABLET | Freq: Four times a day (QID) | ORAL | Status: DC | PRN
  Administered 2017-07-28: 1 via ORAL
  Administered 2017-07-29 – 2017-08-01 (×10): 2 via ORAL
  Administered 2017-08-01: 1 via ORAL
  Administered 2017-08-01 – 2017-08-06 (×9): 2 via ORAL
  Administered 2017-08-07: 1 via ORAL
  Administered 2017-08-08 (×3): 2 via ORAL
  Administered 2017-08-09 – 2017-08-19 (×24): 1 via ORAL
  Filled 2017-07-28: qty 2
  Filled 2017-07-28: qty 1
  Filled 2017-07-28: qty 2
  Filled 2017-07-28 (×3): qty 1
  Filled 2017-07-28 (×3): qty 2
  Filled 2017-07-28 (×2): qty 1
  Filled 2017-07-28 (×3): qty 2
  Filled 2017-07-28 (×2): qty 1
  Filled 2017-07-28: qty 2
  Filled 2017-07-28 (×2): qty 1
  Filled 2017-07-28 (×2): qty 2
  Filled 2017-07-28: qty 1
  Filled 2017-07-28 (×2): qty 2
  Filled 2017-07-28: qty 1
  Filled 2017-07-28 (×2): qty 2
  Filled 2017-07-28 (×3): qty 1
  Filled 2017-07-28: qty 2
  Filled 2017-07-28: qty 1
  Filled 2017-07-28: qty 2
  Filled 2017-07-28 (×2): qty 1
  Filled 2017-07-28 (×7): qty 2
  Filled 2017-07-28: qty 1
  Filled 2017-07-28: qty 2
  Filled 2017-07-28: qty 1
  Filled 2017-07-28: qty 2
  Filled 2017-07-28 (×3): qty 1
  Filled 2017-07-28 (×3): qty 2

## 2017-07-28 MED ORDER — SORBITOL 70 % SOLN
30.0000 mL | Freq: Every day | Status: DC | PRN
  Administered 2017-07-30: 30 mL via ORAL
  Filled 2017-07-28: qty 30

## 2017-07-28 MED ORDER — PHENOL 1.4 % MT LIQD: 1.0000 | OROMUCOSAL | Status: DC | PRN

## 2017-07-28 MED ORDER — HYDROCODONE-ACETAMINOPHEN 5-325 MG PO TABS
1.0000 | ORAL_TABLET | ORAL | Status: DC | PRN
  Administered 2017-07-28: 1 via ORAL
  Filled 2017-07-28: qty 1

## 2017-07-28 MED ORDER — VITAMIN D3 25 MCG (1000 UNIT) PO TABS
2000.0000 [IU] | ORAL_TABLET | Freq: Every day | ORAL | Status: DC
  Administered 2017-07-29 – 2017-08-18 (×21): 2000 [IU] via ORAL
  Filled 2017-07-28 (×43): qty 2

## 2017-07-28 MED ORDER — ACETAMINOPHEN 325 MG PO TABS
325.0000 mg | ORAL_TABLET | Freq: Four times a day (QID) | ORAL | Status: DC | PRN
  Filled 2017-07-28: qty 2

## 2017-07-28 MED ORDER — ATORVASTATIN CALCIUM 20 MG PO TABS
20.0000 mg | ORAL_TABLET | Freq: Every evening | ORAL | Status: DC
  Administered 2017-07-28 – 2017-08-18 (×22): 20 mg via ORAL
  Filled 2017-07-28 (×22): qty 1

## 2017-07-28 MED ORDER — BACLOFEN 10 MG PO TABS
10.0000 mg | ORAL_TABLET | Freq: Four times a day (QID) | ORAL | Status: DC
  Administered 2017-07-28 – 2017-07-29 (×2): 10 mg via ORAL
  Filled 2017-07-28 (×2): qty 1

## 2017-07-28 NOTE — Progress Notes (Signed)
Patient arrived with husband to unit. Oriented to safety plans and unit activities. Verbalized understanding. Pain addressed. Resting comfortably with call bell in reach.

## 2017-07-28 NOTE — Progress Notes (Signed)
Physical Medicine and Rehabilitation Consult  Reason for Consult: Decreased functional mobility  Referring Physician: Dr. Cyndy Freeze  HPI: Megan [name] is a 66 y.o. right handed female with history of hypertension, remote tobacco abuse, diabetes mellitus, multiple prior thoracic or lumbar surgeries and received inpatient rehabilitation services April 2018. Per chart review, husband, and patient, patient lives with spouse. One level home with ramped entrance. Spouse is self-employed and can assist as needed as well as assistance from a neighbor and her daughter. Patient with progressive weakness over the past few months has required scooter for household and community ambulation secondary to bilateral lower extremity weakness. She needed assistance with meals and bathing. Admitted 07/26/2017 with progressive back pain and lower extremity weakness. X-rays and imaging revealed lumbosacral spondylosis with radiculopathy. Underwent L5-S1 anterior retroperitoneal exposure L5-S1 laminectomy, L5-S1 posterior lateral fixation with fusion 07/26/2017 per Dr. Cyndy Freeze. Hospital course pain management. Back brace when out of bed applied in sitting position. Acute blood loss anemia 9.2 and monitored. Physical and occupational therapy evaluations completed with recommendations of physical medicine rehabilitation consult.  Review of Systems  Constitutional: Negative for chills and fever.  HENT: Negative for hearing loss.  Eyes: Negative for blurred vision and double vision.  Respiratory: Negative for cough.  Shortness of breath with exertion  Cardiovascular: Positive for leg swelling. Negative for chest pain and palpitations.  Gastrointestinal: Positive for constipation. Negative for nausea and vomiting.  GERD  Genitourinary: Negative for dysuria, flank pain and hematuria.  Musculoskeletal: Positive for back pain, joint pain and myalgias.  Skin: Negative for rash.  Neurological: Positive for tingling, sensory change,  focal weakness and weakness. Negative for seizures.  All other systems reviewed and are negative.       Past Medical History:  Diagnosis Date  . Arthritis    "hands, back" (07/26/2017)  . Dyspnea    W/ PHYS CONDITION   . GERD (gastroesophageal reflux disease)   . History of bronchitis    "not since I quit smoking" (07/26/2017)  . History of kidney stones   . History of shingles   . Hyperlipidemia    takes Fish Oil daily  . Hypertension   . Neuromuscular disorder (HCC)    tingling toes  . 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 Pneumonia 2009  . PONV (postoperative nausea and vomiting)   . Restless leg   . Type 2 diabetes mellitus ([place])   . Uterine cancer ([place]) 1979   S/P hysterectomy  . Weakness    numbness and tingling in both feet r/t back        Past Surgical History:  Procedure Laterality Date  . ANTERIOR CERVICAL DECOMP/DISCECTOMY FUSION  2001  . BACK SURGERY    . BLADDER SUSPENSION  1991   tack  . INGUINAL HERNIA REPAIR Right 1991  . LUMBAR FUSION  2016; 2017; 07/26/2017   L4-5; L2-3; L5-S1  . [place]  . TUMOR EXCISION     WERTHIN'S TUMORS BOTH SIDES OF NECK  . VAGINAL HYSTERECTOMY  1979        Family History  Problem Relation Age of Onset  . Diabetes Mother   . Dementia Mother   . Cirrhosis Mother    Non alcoholic  . COPD Father   . Diabetes Brother    Social History: reports that she quit smoking about 11 years ago. Her smoking use included cigarettes. She has a 55.50 pack-year smoking history. she has never used smokeless tobacco. She reports  that she drinks alcohol. She reports that she does not use drugs.  Allergies:       Allergies  Allergen Reactions  . Shellfish Allergy Anaphylaxis, Swelling and Other (See Comments)    Tongue swells  . Oxycodone Nausea Only  . Penicillins Rash and Other (See Comments)    Has patient had a PCN reaction causing immediate rash, facial/tongue/throat swelling, SOB or  lightheadedness with hypotension: Yes  Has patient had a PCN reaction causing severe rash involving mucus membranes or skin necrosis: No  Has patient had a PCN reaction that required hospitalization No  Has patient had a PCN reaction occurring within the last 10 years: No  If all of the above answers are "NO", then may proceed with Cephalosporin use.         Medications Prior to Admission  Medication Sig Dispense Refill  . atorvastatin (LIPITOR) 20 MG tablet Take 1 tablet (20 mg total) by mouth every evening. 90 tablet 3  . Biotin 5000 MCG CAPS Take 5,000 mcg daily by mouth.    . cholecalciferol (VITAMIN D) 1000 units tablet Take 1 tablet (1,000 Units total) by mouth daily after lunch. (Patient taking differently: Take 2,000 Units daily after lunch by mouth. ) 30 tablet 1  . Coenzyme Q10 (COQ10) 100 MG CAPS Take 100 mg daily by mouth.     . docusate sodium (COLACE) 100 MG capsule Take 200 mg daily by mouth.    . esomeprazole (NEXIUM 24HR) 20 MG capsule Take 20 mg daily at 12 noon by mouth.    . gabapentin (NEURONTIN) 300 MG capsule Take 1 capsule (300 mg total) by mouth 3 (three) times daily. 90 capsule 2  . Garlic 932 MG CAPS Take 500 mg 2 (two) times daily by mouth.    Marland Kitchen HYDROcodone-acetaminophen (NORCO/VICODIN) 5-325 MG tablet Take 1 tablet every 6 (six) hours as needed by mouth for moderate pain.   0  . ibuprofen (ADVIL,MOTRIN) 200 MG tablet Take 400-600 mg every 6 (six) hours as needed by mouth for headache or moderate pain.    Marland Kitchen lisinopril (PRINIVIL,ZESTRIL) 10 MG tablet TAKE 1 TABLET(10 MG) BY MOUTH DAILY 90 tablet 1  . metFORMIN (GLUCOPHAGE) 500 MG tablet Take 1 tablet (500 mg total) by mouth 2 (two) times daily with a meal. 180 tablet 2  . methocarbamol (ROBAXIN) 750 MG tablet Take 1 tablet (750 mg total) by mouth 3 (three) times daily. 90 tablet 2  . oxybutynin (OXYTROL) 3.9 MG/24HR Place 1 patch See admin instructions onto the skin. Apply 1 patch to skin every 5 days    .  pantoprazole (PROTONIX) 40 MG tablet Take 1 tablet (40 mg total) by mouth daily. (Patient not taking: Reported on 07/18/2017) 30 tablet 1  . senna-docusate (SENOKOT-S) 8.6-50 MG tablet Take 2 tablets by mouth 2 (two) times daily. (Patient not taking: Reported on 07/18/2017)     Home:  Home Living  Family/patient expects to be discharged to:: Private residence  Living Arrangements: Spouse/significant other  Available Help at Discharge: Family, Available 24 hours/day  Type of Home: House  Home Access: [place]: One level  Bathroom Shower/Tub: Tub/shower unit, Door, Artist: Standard  Bathroom Accessibility: Yes  Home Equipment: Civil engineer, contracting, Environmental consultant - 2 wheels, Cane - single point, Environmental consultant - 4 wheels, Grab bars - tub/shower  Additional Comments: Spouse works but runs his own company and can come and go as needed.  Functional History:  Prior Function  Level of  Independence: Needs assistance  Gait / Transfers Assistance Needed: Pt has had progressive weakness over past few months - for past 3 weeks, has required scooter for household and community amb secondary to BLE weakness; requires intermittent assist from husband to transfer to/from. Prior to this, she required RW for amb.  ADL's / Homemaking Assistance Needed: Husband performs cooking/cleaning; provides intermittent assist for dressing, bathing. Pt indep with pericare.  Comments: using scooter for mobility around the house. Spouse providing intermittent assist with ADLs. Prior to onset of weakness pt was ambulatory with SPC.  Functional Status:  Mobility:  Bed Mobility  Overal bed mobility: Needs Assistance  Bed Mobility: Rolling, Sidelying to Sit  Rolling: Min assist  Sidelying to sit: Mod assist, +2 for physical assistance  General bed mobility comments: VCs for technique, min assist to complete roll to sidelying, moderate assist of 2 persons to elevate trunk to upright positioning  Transfers   Overall transfer level: Needs assistance  Equipment used: Rolling walker (2 wheeled)  Transfers: Sit to/from Stand, [employer] Transfers  Sit to Stand: Mod assist, +2 physical assistance, From elevated surface  Stand pivot transfers: Max assist, +2 physical assistance, From elevated surface  General transfer comment: Patient required increased physical assist to power up to standing with gait belt and chuck pad as well as +2 assist, max assist during pivot to chair. Noted RLE fasiculations and clonus impacting ability to step with RLE. Heavy relaince on RW for support. Patient very anxious during transition.  Ambulation/Gait  General Gait Details: unable to perform today   ADL:  ADL  Overall ADL's : Needs assistance/impaired  Eating/Feeding: Set up, Sitting  Grooming: Min guard, Sitting, Wash/dry face  Upper Body Bathing: Minimal assistance, Sitting  Lower Body Bathing: Maximal assistance, Sit to/from stand  Upper Body Dressing : Minimal assistance, Sitting  Upper Body Dressing Details (indicate cue type and reason): to don brace and gown  Lower Body Dressing: Maximal assistance, Sit to/from stand  Lower Body Dressing Details (indicate cue type and reason): to don socks  Toilet Transfer: Maximal assistance, +2 for physical assistance, Stand-pivot, BSC, RW  Toilet Transfer Details (indicate cue type and reason): Simulated by stand pivot EOB>chair  Functional mobility during ADLs: Maximal assistance, +2 for physical assistance, Rolling walker(for stand pivot only)  General ADL Comments: Pt able to recall 3/3 back precautions  Cognition:  Cognition  Overall Cognitive Status: Within Functional Limits for tasks assessed  Orientation Level: Oriented X4  Cognition  Arousal/Alertness: Awake/alert  Behavior During Therapy: Anxious  Overall Cognitive Status: Within Functional Limits for tasks assessed  Blood pressure 136/66, pulse 93, temperature 98.5 F (36.9 C), temperature source Oral,  resp. rate 16, weight 80.3 kg (177 lb), SpO2 98 %.  Physical Exam  Vitals reviewed.  Constitutional: She is oriented to person, place, and time. She appears well-developed.  Obese  HENT:  Head: Normocephalic and atraumatic.  Eyes: EOM are normal. Right eye exhibits no discharge. Left eye exhibits no discharge.  Neck: Normal range of motion. Neck supple. No thyromegaly present.  Cardiovascular: Normal rate, regular rhythm and normal heart sounds.  Respiratory: Effort normal and breath sounds normal. No respiratory distress.  GI: Soft. Bowel sounds are normal. She exhibits no distension.  Musculoskeletal: She exhibits edema. She exhibits no tenderness.  Neurological: She is alert and oriented to person, place, and time.  Motor: B/l UE 4+/5 proximal to distal LLE: 4/5 HF, KE, 4+/5 ADF/PF RLE: HF, KE 2/5, ADF/PF 4/5 Sensation diminished to light  tough RLE DTRs 1+ RLE  Skin: Skin is warm and dry.  Back incision is dressed +Drain  Psychiatric: She has a normal mood and affect. Her behavior is normal. Thought content normal.   Lab Results Last 24 Hours                                                                                                                                                                                                                                                       Imaging Results (Last 48 hours)     Assessment/Plan:  Diagnosis: Lumbosacral fusion  Labs independently reviewed. Records reviewed and summated above.  1. Does the need for close, 24 hr/day medical supervision in concert with the patient's rehab needs make it unreasonable for this patient to be served in a less intensive setting? Yes  2. Co-Morbidities requiring supervision/potential complications: Acute blood loss anemia (transfuse if necessary to ensure appropriate perfusion for increased activity tolerance), post-op pain management (Biofeedback  training with therapies to help reduce reliance on opiate pain medications, monitor pain control during therapies, and sedation at rest and titrate to maximum efficacy to ensure participation and gains in therapies), HTN (monitor and provide prns in accordance with increased physical exertion and pain), remote tobacco abuse, DM (Monitor in accordance with exercise and adjust meds as necessary), multiple prior thoracic or lumbar surgeries, ID (narrow IV Vanc when appropriate) 3. Due to safety, skin/wound care, disease management, pain management and patient education, does the patient require 24 hr/day rehab nursing? Yes 4. Does the patient require coordinated care of a physician, rehab nurse, PT (1-2 hrs/day, 5 days/week) and OT (1-2 hrs/day, 5 days/week) to address physical and functional deficits in the context of the above medical diagnosis(es)? Yes Addressing deficits in the following areas: balance, endurance, locomotion, strength, transferring, bathing, dressing, toileting and psychosocial support 5. Can the patient actively participate in an intensive therapy program of at least 3 hrs of therapy per day at least 5 days per week? Yes 6. The potential for patient to make measurable gains while on inpatient rehab is excellent 7. Anticipated functional outcomes upon discharge from inpatient rehab are min assist with PT - Mod I at wheelchair level for mobility, min assist with OT, n/a with SLP. 8. Estimated rehab length of stay to reach the above functional goals  is: 17-20 days. 9. Anticipated D/C setting: Home 10. Anticipated post D/C treatments: HH therapy and Home excercise program 11. Overall Rehab/Functional Prognosis: good and fair RECOMMENDATIONS:  This patient's condition is appropriate for continued rehabilitative care in the following setting: CIR when medically appropriate  Patient has agreed to participate in recommended program. Yes  Note that insurance prior authorization may be  required for reimbursement for recommended care.  Comment: Rehab Admissions Coordinator to follow up.  Delice Lesch, MD, ABPMR  Lauraine Rinne J., PA-C  07/27/2017

## 2017-07-28 NOTE — Progress Notes (Signed)
No acute events Approved for rehab Moving legs at baseline Will d/c to rehab today

## 2017-07-28 NOTE — Progress Notes (Signed)
Megan Gong, RN  Rehab Admission Coordinator  Physical Medicine and Rehabilitation  PMR Pre-admission  Signed  Date of Service:  07/28/2017 11:23 AM       Related encounter: Admission (Current) from 07/26/2017 in [place]           [] Hide copied text  [] Hover for details   PMR Admission Coordinator Pre-Admission Assessment  Patient: Megan [name] is an 66 y.o., female MRN: [id number] DOB: November 05, 1950 Height: 5\' 8"  (172.7 cm) Weight: 80.3 kg (177 lb)                                                                                                                                                  Insurance Information HMO:     PPO: yes     PCP:      IPA:      80/20:      OTHER:  PRIMARY: [employer] [place]      Policy#: [id number]      Subscriber: pt CM Name: Megan [name]      Phone#: [phone]     Fax#: [phone] Pre-Cert#: [id number] approved thru 11/28 when updates are due      Employer:  Benefits:  Phone #: [phone]     Name: 07/28/2017 Eff. Date: 09/13/2016     Deduct: $7000      Out of Pocket Max: $7350 includes deductible      Life Max: none CIR: 70%      SNF: 70% 60 days Outpatient: $75 co pay per visit     Co-Pay: 30 visits each PT, OT and SLP; has used all visits for PT and OT Home Health: 70%      Co-Pay:  Per medical neccesity with authorization DME: 70%     Co-Pay: 30% Providers: in network  SECONDARY: none     Medicaid Application Date:       Case Manager:  Disability Application Date:       Case Worker:   Emergency Tax adviser Information    Name Relation Home Work Nettie [phone]  [phone]   Hughes Better Daughter [phone]  [phone]     Current Medical History  Patient Admitting Diagnosis: lumbosacral fusion  History of Present Illness:  [name] a 66 y.o.right handed femalewith history of  hypertension,remote tobacco abuse,diabetes mellitus, multiple prior thoracic or lumbar surgeriesand received inpatient rehabilitation services April 2018.  Patient with progressive weakness over the past few months hasrequired scooter for household and community ambulation secondary to bilateral lower extremity weakness. She needed assistance with meals and bathing.Admitted 07/26/2017 with progressive back pain and lower extremity weakness. X-rays and imaging revealed lumbosacral spondylosis with radiculopathy. Underwent L5-S1 anterior retroperitoneal exposure L5-S1 laminectomy, L5-S1 posterior  lateral fixation with fusion 07/26/2017 per Dr.Ditty.Hospital course pain management. Back brace when out of bed applied in sitting position. Acute blood loss anemia 9.1and monitored.Close monitoring of any orthostasis when out of bed.  Past Medical History      Past Medical History:  Diagnosis Date  . Arthritis    "hands, back" (07/26/2017)  . Dyspnea    W/ PHYS CONDITION   . GERD (gastroesophageal reflux disease)   . History of bronchitis    "not since I quit smoking" (07/26/2017)  . History of kidney stones   . History of shingles   . Hyperlipidemia    takes Fish Oil daily  . Hypertension   . Neuromuscular disorder (HCC)    tingling toes  . 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 Pneumonia 2009  . PONV (postoperative nausea and vomiting)   . Restless leg   . Type 2 diabetes mellitus ([place])   . Uterine cancer ([place]) 1979   S/P hysterectomy  . Weakness    numbness and tingling in both feet r/t back    Family History  family history includes COPD in her father; Cirrhosis in her mother; Dementia in her mother; Diabetes in her brother and mother.  Prior Rehab/Hospitalizations:  Has the patient had major surgery during 100 days prior to admission? No CIR admit 12/2016 went home with [facility] then Out pt therapy. See Dr. Letta Pate as an outpatient  also  Current Medications   Current Facility-Administered Medications:  .  0.9 %  sodium chloride infusion, , Intravenous, Continuous, Ditty, Kevan Ny, MD, Stopped at 07/27/17 0500 .  acetaminophen (TYLENOL) tablet 325-650 mg, 325-650 mg, Oral, Q6H PRN, Ashok Pall, MD .  atorvastatin (LIPITOR) tablet 20 mg, 20 mg, Oral, QPM, Ditty, Kevan Ny, MD, 20 mg at 07/27/17 1737 .  baclofen (LIORESAL) tablet 10 mg, 10 mg, Oral, Q8H, Ditty, Kevan Ny, MD, 10 mg at 07/28/17 0559 .  bisacodyl (DULCOLAX) EC tablet 5 mg, 5 mg, Oral, Daily PRN, Ditty, Kevan Ny, MD .  celecoxib (CELEBREX) capsule 200 mg, 200 mg, Oral, Q12H, Ditty, Kevan Ny, MD, 200 mg at 07/28/17 0917 .  cholecalciferol (VITAMIN D) tablet 2,000 Units, 2,000 Units, Oral, QPC lunch, Ditty, Kevan Ny, MD, 2,000 Units at 07/27/17 1425 .  docusate sodium (COLACE) capsule 100 mg, 100 mg, Oral, BID, Ditty, Kevan Ny, MD, 100 mg at 07/28/17 0918 .  gabapentin (NEURONTIN) capsule 300 mg, 300 mg, Oral, TID, Ditty, Kevan Ny, MD, 300 mg at 07/28/17 0917 .  HYDROcodone-acetaminophen (NORCO) 10-325 MG per tablet 1-2 tablet, 1-2 tablet, Oral, Q4H PRN, Ditty, Kevan Ny, MD, 2 tablet at 07/28/17 0559 .  HYDROmorphone (DILAUDID) injection 1 mg, 1 mg, Intravenous, Q3H PRN, Ditty, Kevan Ny, MD .  lisinopril (PRINIVIL,ZESTRIL) tablet 10 mg, 10 mg, Oral, Daily, Ditty, Kevan Ny, MD, 10 mg at 07/28/17 0917 .  menthol-cetylpyridinium (CEPACOL) lozenge 3 mg, 1 lozenge, Oral, PRN **OR** phenol (CHLORASEPTIC) mouth spray 1 spray, 1 spray, Mouth/Throat, PRN, Ditty, Kevan Ny, MD .  metFORMIN (GLUCOPHAGE) tablet 500 mg, 500 mg, Oral, BID WC, Ditty, Kevan Ny, MD, 500 mg at 07/28/17 0917 .  ondansetron (ZOFRAN) tablet 4 mg, 4 mg, Oral, Q6H PRN, 4 mg at 07/27/17 1054 **OR** ondansetron (ZOFRAN) injection 4 mg, 4 mg, Intravenous, Q6H PRN, Ditty, Kevan Ny, MD .  pantoprazole (PROTONIX) EC tablet 40  mg, 40 mg, Oral, Daily, Ditty, Kevan Ny, MD, 40 mg at 07/28/17 0917 .  senna (SENOKOT) tablet 8.6 mg, 1 tablet,  Oral, BID, Ditty, Kevan Ny, MD, 8.6 mg at 07/28/17 0917 .  sodium phosphate (FLEET) 7-19 GM/118ML enema 1 enema, 1 enema, Rectal, Once PRN, Ditty, Kevan Ny, MD .  vancomycin (VANCOCIN) IVPB 1000 mg/200 mL premix, 1,000 mg, Intravenous, Q12H, Masters, Jake Church, RPH, Stopped at 07/28/17 1116 .  zolpidem (AMBIEN) tablet 5 mg, 5 mg, Oral, QHS PRN, Ditty, Kevan Ny, MD  Patients Current Diet: Diet Carb Modified Fluid consistency: Thin; Room service appropriate? Yes  Precautions / Restrictions Precautions Precautions: Back, Fall Precaution Booklet Issued: Yes (comment) Precaution Comments: pt able to state 2/3 precautions and educated for all with handout given Spinal Brace: Lumbar corset, Applied in sitting position Other Brace/Splint: AFO RLE Restrictions Weight Bearing Restrictions: No   Has the patient had 2 or more falls or a fall with injury in the past year?No  Prior Activity Level Limited Community (1-2x/wk): Mod I with cane prior to fall 10/08/2016 and driving(Mow Mod I to supervision with RW and electric scooter)  Home Assistive Devices / Equipment Home Assistive Devices/Equipment: Eyeglasses, Dentures (specify type), Walker (specify type), Other (Comment), CBG Meter, Hospital bed, Shower chair with back, Grab bars around toilet, Grab bars in shower, Hand-held shower hose Home Equipment: Shower seat, Environmental consultant - 2 wheels, Cane - single point, [employer] - 4 wheels, Grab bars - tub/shower  Prior Device Use: Indicate devices/aids used by the patient prior to current illness, exacerbation or injury? Walker and Transport planner  Prior Functional Level Prior Function Level of Independence: Needs assistance Gait / Transfers Assistance Needed: Pt has had progressive weakness over past few months - for past 3 weeks, has required scooter for household and  community amb secondary to BLE weakness; requires intermittent assist from husband to transfer to/from. Prior to this, she required RW for amb.  ADL's / Homemaking Assistance Needed: Husband performs cooking/cleaning; provides intermittent assist for dressing, bathing. Pt indep with pericare. Comments: using scooter for mobility around the house. Spouse providing intermittent assist with ADLs. Prior to onset of weakness pt was ambulatory with SPC.   Self Care: Did the patient need help bathing, dressing, using the toilet or eating?  Needed some help  Indoor Mobility: Did the patient need assistance with walking from room to room (with or without device)? Needed some help  Stairs: Did the patient need assistance with internal or external stairs (with or without device)? Needed some help  Functional Cognition: Did the patient need help planning regular tasks such as shopping or remembering to take medications? Needed some help  Current Functional Level Cognition  Overall Cognitive Status: Impaired/Different from baseline Orientation Level: Oriented X4 Safety/Judgement: Decreased awareness of safety General Comments: pt trying to sit before fully backed to surface despite cues    Extremity Assessment (includes Sensation/Coordination)  Upper Extremity Assessment: Overall WFL for tasks assessed  Lower Extremity Assessment: Defer to PT evaluation RLE Deficits / Details: noted fasiculations with RLE RLE Sensation: decreased light touch RLE Coordination: decreased gross motor LLE Deficits / Details: noted weakness 2+/5 LLE Coordination: decreased fine motor, decreased gross motor    ADLs  Overall ADL's : Needs assistance/impaired Eating/Feeding: Set up, Sitting Grooming: Min guard, Sitting, Wash/dry face Upper Body Bathing: Minimal assistance, Sitting Lower Body Bathing: Maximal assistance, Sit to/from stand Upper Body Dressing : Minimal assistance, Sitting Upper Body Dressing  Details (indicate cue type and reason): to don brace and gown Lower Body Dressing: Maximal assistance, Sit to/from stand Lower Body Dressing Details (indicate cue type and reason): to don socks  Toilet Transfer: Maximal assistance, +2 for physical assistance, Stand-pivot, BSC, RW Toilet Transfer Details (indicate cue type and reason): Simulated by stand pivot EOB>chair Functional mobility during ADLs: Maximal assistance, +2 for physical assistance, Rolling walker(for stand pivot only) General ADL Comments: Pt able to recall 3/3 back precautions    Mobility  Overal bed mobility: Needs Assistance Bed Mobility: Rolling, Sidelying to Sit Rolling: Min assist Sidelying to sit: Min assist General bed mobility comments: cues for sequence with assist to elevate trunk from surface    Transfers  Overall transfer level: Needs assistance Equipment used: Rolling walker (2 wheeled) Transfers: Sit to/from Stand Sit to Stand: Mod assist, +2 physical assistance Stand pivot transfers: Max assist, +2 physical assistance, From elevated surface General transfer comment: cues for posture, sequence and safety with assist to rise from surface.     Ambulation / Gait / Stairs / Wheelchair Mobility  Ambulation/Gait Ambulation/Gait assistance: Mod assist, +2 physical assistance, +2 safety/equipment Ambulation Distance (Feet): 3 Feet Assistive device: Rolling walker (2 wheeled) Gait Pattern/deviations: Shuffle General Gait Details: cues for sequence, posture and position in RW. Pt able to step a few feet with chair behind her but denied further distance stating nausea Gait velocity interpretation: Below normal speed for age/gender    Posture / Balance Dynamic Sitting Balance Sitting balance - Comments: unable to reach outside of BOS utilized UE on bed to brace trunk due to pain Balance Overall balance assessment: Needs assistance Sitting-balance support: Bilateral upper extremity supported, Feet  supported Sitting balance-Leahy Scale: Fair Sitting balance - Comments: unable to reach outside of BOS utilized UE on bed to brace trunk due to pain Standing balance support: During functional activity, Bilateral upper extremity supported Standing balance-Leahy Scale: Poor Standing balance comment: moderate to max assist for stability, significant reliance on Rw for UE support    Special needs/care consideration BiPAP/CPAP n/a CPM  N/a Continuous Drip IV no Dialysis  N/a Life Vest  N/a Oxygen n/a Special Bed n/a Trach Size n/a Wound Vac n/a Skin surgical incision Bowel mgmt: continent LBM 07/25/2017 Bladder mgmt: incontinent ; external catheter Diabetic mgmt yes pta   Previous Home Environment Living Arrangements: Spouse/significant other(daughter)  Lives With: Spouse Available Help at Discharge: Family, Available 24 hours/day Type of Home: House Home Layout: One level Home Access: Ramped entrance Bathroom Shower/Tub: Tub/shower unit, Door, Architectural technologist: Programmer, systems: (with both RW and electric scooter) Home Care Services: (was receiving oupt therapy) Additional Comments: Spouse works but runs his own company and can come and go as needed.   Discharge Living Setting Plans for Discharge Living Setting: Patient's home, Lives with (comment)(spouse and daughter) Type of Home at Discharge: House Discharge Home Layout: One level Discharge Home Access: [place] entrance Discharge Bathroom Shower/Tub: Tub/shower unit, Curtain Discharge Bathroom Toilet: Standard Discharge Bathroom Accessibility: Yes How Accessible: Accessible via walker, Other (comment)(as well as electric scooter) Does the patient have any problems obtaining your medications?: No  Social/Family/Support Systems Patient Roles: Spouse, Parent Contact Information: [name], spouse Anticipated Caregiver: spouse and daughter Anticipated Ambulance person Information: see  above Ability/Limitations of Caregiver: spouse works but owns his company with flexible schedule Caregiver Availability: 24/7 Discharge Plan Discussed with Primary Caregiver: Yes Is Caregiver In Agreement with Plan?: Yes Does Caregiver/Family have Issues with Lodging/Transportation while Pt is in Rehab?: No  Goals/Additional Needs Patient/Family Goal for Rehab: Mod I to min assist at wheelchair level PT, Min assist OT Expected length of stay: ELOS 17-20 days Pt/Family Agrees to Admission and willing  to participate: Yes Program Orientation Provided & Reviewed with Pt/Caregiver Including Roles  & Responsibilities: Yes  Decrease burden of Care through IP rehab admission: n/a  Possible need for SNF placement upon discharge:not anticipated  Patient Condition: This patient's condition remains as documented in the consult dated 07/27/2017, in which the Rehabilitation Physician determined and documented that the patient's condition is appropriate for intensive rehabilitative care in an inpatient rehabilitation facility. Will admit to inpatient rehab today.  Preadmission Screen Completed By:  Cleatrice Burke, 07/28/2017 11:38 AM ______________________________________________________________________   Discussed status with Dr. Letta Pate on 07/28/2017 at  1138 and received telephone approval for admission today.  Admission Coordinator:  Cleatrice Burke, time 6808 Date 07/28/2017             Cosigned by: Charlett Blake, MD at 07/28/2017 12:17 PM  Revision History

## 2017-07-28 NOTE — Discharge Summary (Signed)
Date of Admission: 07/26/2017  Date of Discharge: 07/28/17  Admission Diagnosis: L5-S1 disc herniation  Discharge Diagnosis: Same  Procedure Performed: L5-S1 ALIF; L5-S1 posterolateral fusion with L4 and pelvic fixation  Attending: Malayjah Otoole, Kevan Ny, MD  Hospital Course:  The patient was admitted for the above listed operation and had an uncomplicated post-operative course.  She was discharged in stable condition.  Follow up: 3 weeks  Allergies as of 07/28/2017      Reactions   Shellfish Allergy Anaphylaxis, Swelling, Other (See Comments)   Tongue swells   Oxycodone Nausea Only   Penicillins Rash, Other (See Comments)   Has patient had a PCN reaction causing immediate rash, facial/tongue/throat swelling, SOB or lightheadedness with hypotension: Yes Has patient had a PCN reaction causing severe rash involving mucus membranes or skin necrosis: No Has patient had a PCN reaction that required hospitalization No Has patient had a PCN reaction occurring within the last 10 years: No If all of the above answers are "NO", then may proceed with Cephalosporin use.      Medication List    STOP taking these medications   ibuprofen 200 MG tablet Commonly known as:  ADVIL,MOTRIN   methocarbamol 750 MG tablet Commonly known as:  ROBAXIN     TAKE these medications   atorvastatin 20 MG tablet Commonly known as:  LIPITOR Take 1 tablet (20 mg total) by mouth every evening.   baclofen 10 MG tablet Commonly known as:  LIORESAL Take 1 tablet (10 mg total) every 8 (eight) hours by mouth.   Biotin 5000 MCG Caps Take 5,000 mcg daily by mouth.   cholecalciferol 1000 units tablet Commonly known as:  VITAMIN D Take 1 tablet (1,000 Units total) by mouth daily after lunch. What changed:  how much to take   CoQ10 100 MG Caps Take 100 mg daily by mouth.   docusate sodium 100 MG capsule Commonly known as:  COLACE Take 200 mg daily by mouth.   gabapentin 300 MG capsule Commonly  known as:  NEURONTIN Take 1 capsule (300 mg total) by mouth 3 (three) times daily.   Garlic 160 MG Caps Take 500 mg 2 (two) times daily by mouth.   HYDROcodone-acetaminophen 5-325 MG tablet Commonly known as:  NORCO/VICODIN Take 1 tablet every 6 (six) hours as needed by mouth for moderate pain. What changed:  Another medication with the same name was added. Make sure you understand how and when to take each.   HYDROcodone-acetaminophen 5-325 MG tablet Commonly known as:  NORCO/VICODIN Take 1-2 tablets every 4 (four) hours as needed by mouth (1 tablet for 4-7/10 pain and 2 tablets for 8-10/10 pain). What changed:  You were already taking a medication with the same name, and this prescription was added. Make sure you understand how and when to take each.   lisinopril 10 MG tablet Commonly known as:  PRINIVIL,ZESTRIL TAKE 1 TABLET(10 MG) BY MOUTH DAILY   metFORMIN 500 MG tablet Commonly known as:  GLUCOPHAGE Take 1 tablet (500 mg total) by mouth 2 (two) times daily with a meal.   NEXIUM 24HR 20 MG capsule Generic drug:  esomeprazole Take 20 mg daily at 12 noon by mouth.   oxybutynin 3.9 MG/24HR Commonly known as:  OXYTROL Place 1 patch See admin instructions onto the skin. Apply 1 patch to skin every 5 days   pantoprazole 40 MG tablet Commonly known as:  PROTONIX Take 1 tablet (40 mg total) by mouth daily.   senna-docusate 8.6-50 MG tablet Commonly  known as:  Senokot-S Take 2 tablets by mouth 2 (two) times daily.

## 2017-07-28 NOTE — Progress Notes (Signed)
Patient transferred to inpatient rehab room [phone] report called to [facility]. Family at bedside and aware of transfer   Gelsey Amyx, Tivis Ringer, RN

## 2017-07-28 NOTE — H&P (Signed)
Physical Medicine and Rehabilitation Admission H&P     Chief complaint: Back pain   HPI: Megan [name] is a 66 y.o. 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 Per chart review, husband, and patient, patient lives with spouse. One level home with ramped entrance. Spouse is self-employed and can assist as needed as well as assistance from a neighbor and her daughter. Patient with progressive weakness over the past few months has required scooter for household and community ambulation secondary to bilateral lower extremity weakness. She needed assistance with meals and bathing.   Last PMR visit August 2018, at that time still had right hip flexor and ankle dorsiflexor weakness.  Was still taking gabapentin for paresthesias, patient was completing outpatient therapy when she noted increasing left lower extremity weakness, she was evaluated by neurosurgery and on 06/05/2017 underwent thoracic and lumbar MRI which showed a new left L5-S1 protrusion compressing S1 nerve root. Admitted 07/26/2017 with progressive back pain and lower extremity weakness. X-rays and imaging revealed lumbosacral spondylosis with radiculopathy. Underwent L5-S1 anterior retroperitoneal exposure L5-S1 laminectomy, L5-S1 posterior lateral fixation with fusion 07/26/2017 per Dr. Cyndy Freeze. Hospital course pain management. Back brace when out of bed applied in sitting position. Acute blood loss anemia 9.1 and monitored. Close monitoring of any orthostasis when out of bed. Physical and occupational therapy evaluations completed with recommendations of physical medicine rehabilitation consult. Patient was admitted for a comprehensive rehabilitation program       Review of  Systems  Constitutional: Negative for chills and fever.  Eyes: Negative for blurred vision and double vision.  Respiratory: Negative for cough.        Shortness of breath with exertion  Cardiovascular: Positive for leg swelling. Negative for chest pain and palpitations.  Gastrointestinal: Positive for constipation and nausea.       GERD    Genitourinary: Negative for dysuria, flank pain and hematuria.  Musculoskeletal: Positive for back pain and myalgias.  Skin: Negative for rash.  Neurological: Positive for dizziness, tingling, sensory change and weakness.  All other systems reviewed and are negative.       Past Medical History:  Diagnosis Date  . Arthritis      "hands, back" (07/26/2017)  . Dyspnea      W/ PHYS CONDITION   . GERD (gastroesophageal reflux disease)    . History of bronchitis      "not since I quit smoking" (07/26/2017)  . History of kidney stones    . History of shingles    . Hyperlipidemia      takes Fish Oil daily  . Hypertension    . Neuromuscular disorder (HCC)      tingling toes  . 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 Pneumonia 2009  . PONV (postoperative nausea and vomiting)    . Restless leg    . Type 2 diabetes mellitus ([place])    . Uterine cancer ([place]) 1979    S/P hysterectomy  . Weakness      numbness and tingling in both feet r/t back         Past Surgical History:  Procedure Laterality Date  . ANTERIOR CERVICAL DECOMP/DISCECTOMY FUSION   2001  . APPLICATION OF ROBOTIC ASSISTANCE FOR SPINAL PROCEDURE   12/19/2016    Procedure: APPLICATION OF  ROBOTIC ASSISTANCE FOR SPINAL PROCEDURE;  Surgeon: Kevan Ny Ditty, MD;  Location: [facility];  Service: Neurosurgery;;  . BACK SURGERY      . BLADDER SUSPENSION   1991    tack  . INGUINAL HERNIA REPAIR Right 1991  . LUMBAR FUSION   2016; 2017; 07/26/2017    L4-5; L2-3; L5-S1  . [place]  . TUMOR EXCISION        WERTHIN'S TUMORS BOTH SIDES OF NECK  . VAGINAL  HYSTERECTOMY   1979         Family History  Problem Relation Age of Onset  . Diabetes Mother    . Dementia Mother    . Cirrhosis Mother          Non alcoholic  . COPD Father    . Diabetes Brother      Social History:  reports that she quit smoking about 11 years ago. Her smoking use included cigarettes. She has a 55.50 pack-year smoking history. she has never used smokeless tobacco. She reports that she drinks alcohol. She reports that she does not use drugs. Allergies:       Allergies  Allergen Reactions  . Shellfish Allergy Anaphylaxis, Swelling and Other (See Comments)      Tongue swells  . Oxycodone Nausea Only  . Penicillins Rash and Other (See Comments)      Has patient had a PCN reaction causing immediate rash, facial/tongue/throat swelling, SOB or lightheadedness with hypotension: Yes Has patient had a PCN reaction causing severe rash involving mucus membranes or skin necrosis: No Has patient had a PCN reaction that required hospitalization No Has patient had a PCN reaction occurring within the last 10 years: No If all of the above answers are "NO", then may proceed with Cephalosporin use.          Medications Prior to Admission  Medication Sig Dispense Refill  . atorvastatin (LIPITOR) 20 MG tablet Take 1 tablet (20 mg total) by mouth every evening. 90 tablet 3  . Biotin 5000 MCG CAPS Take 5,000 mcg daily by mouth.      . cholecalciferol (VITAMIN D) 1000 units tablet Take 1 tablet (1,000 Units total) by mouth daily after lunch. (Patient taking differently: Take 2,000 Units daily after lunch by mouth. ) 30 tablet 1  . Coenzyme Q10 (COQ10) 100 MG CAPS Take 100 mg daily by mouth.       . docusate sodium (COLACE) 100 MG capsule Take 200 mg daily by mouth.      . esomeprazole (NEXIUM 24HR) 20 MG capsule Take 20 mg daily at 12 noon by mouth.      . gabapentin (NEURONTIN) 300 MG capsule Take 1 capsule (300 mg total) by mouth 3 (three) times daily. 90 capsule 2  . Garlic 010 MG  CAPS Take 500 mg 2 (two) times daily by mouth.      Marland Kitchen HYDROcodone-acetaminophen (NORCO/VICODIN) 5-325 MG tablet Take 1 tablet every 6 (six) hours as needed by mouth for moderate pain.    0  . ibuprofen (ADVIL,MOTRIN) 200 MG tablet Take 400-600 mg every 6 (six) hours as needed by mouth for headache or moderate pain.      Marland Kitchen lisinopril (PRINIVIL,ZESTRIL) 10 MG tablet TAKE 1 TABLET(10 MG) BY MOUTH DAILY 90 tablet 1  . metFORMIN (GLUCOPHAGE) 500 MG tablet Take 1 tablet (500 mg total) by mouth 2 (two) times daily with a meal. 180 tablet 2  . methocarbamol (ROBAXIN) 750 MG tablet Take  1 tablet (750 mg total) by mouth 3 (three) times daily. 90 tablet 2  . oxybutynin (OXYTROL) 3.9 MG/24HR Place 1 patch See admin instructions onto the skin. Apply 1 patch to skin every 5 days      . pantoprazole (PROTONIX) 40 MG tablet Take 1 tablet (40 mg total) by mouth daily. (Patient not taking: Reported on 07/18/2017) 30 tablet 1  . senna-docusate (SENOKOT-S) 8.6-50 MG tablet Take 2 tablets by mouth 2 (two) times daily. (Patient not taking: Reported on 07/18/2017)          Drug Regimen Review Drug regimen was reviewed and remains appropriate with no significant issues identified   Home: Home Living Family/patient expects to be discharged to:: Private residence Living Arrangements: Spouse/significant other Available Help at Discharge: Family, Available 24 hours/day Type of Home: House Home Access: Ramped entrance Home Layout: One level Bathroom Shower/Tub: Tub/shower unit, Door, Architectural technologist: Standard Bathroom Accessibility: Yes Home Equipment: Civil engineer, contracting, [place] 2 wheels, Cane - single point, Environmental consultant - 4 wheels, Grab bars - tub/shower Additional Comments: Spouse works but runs his own company and can come and go as needed.    Functional History: Prior Function Level of Independence: Needs assistance Gait / Transfers Assistance Needed: Pt has had progressive weakness over past few months - for past  3 weeks, has required scooter for household and community amb secondary to BLE weakness; requires intermittent assist from husband to transfer to/from. Prior to this, she required RW for amb.  ADL's / Homemaking Assistance Needed: Husband performs cooking/cleaning; provides intermittent assist for dressing, bathing. Pt indep with pericare. Comments: using scooter for mobility around the house. Spouse providing intermittent assist with ADLs. Prior to onset of weakness pt was ambulatory with SPC.    Functional Status:  Mobility: Bed Mobility Overal bed mobility: Needs Assistance Bed Mobility: Rolling, Sidelying to Sit Rolling: Min assist Sidelying to sit: Mod assist, +2 for physical assistance General bed mobility comments: VCs for technique, min assist to complete roll to sidelying, moderate assist of 2 persons to elevate trunk to upright positioning Transfers Overall transfer level: Needs assistance Equipment used: Rolling walker (2 wheeled) Transfers: Sit to/from Stand, [employer] Transfers Sit to Stand: Mod assist, +2 physical assistance, From elevated surface Stand pivot transfers: Max assist, +2 physical assistance, From elevated surface General transfer comment: Patient required increased physical assist to power up to standing with gait belt and chuck pad as well as +2 assist, max assist during pivot to chair. Noted RLE fasiculations and clonus impacting ability to step with RLE. Heavy relaince on RW for support. Patient very anxious during transition.  Ambulation/Gait General Gait Details: unable to perform today    ADL: ADL Overall ADL's : Needs assistance/impaired Eating/Feeding: Set up, Sitting Grooming: Min guard, Sitting, Wash/dry face Upper Body Bathing: Minimal assistance, Sitting Lower Body Bathing: Maximal assistance, Sit to/from stand Upper Body Dressing : Minimal assistance, Sitting Upper Body Dressing Details (indicate cue type and reason): to don brace and  gown Lower Body Dressing: Maximal assistance, Sit to/from stand Lower Body Dressing Details (indicate cue type and reason): to don socks Toilet Transfer: Maximal assistance, +2 for physical assistance, Stand-pivot, BSC, RW Toilet Transfer Details (indicate cue type and reason): Simulated by stand pivot EOB>chair Functional mobility during ADLs: Maximal assistance, +2 for physical assistance, Rolling walker(for stand pivot only) General ADL Comments: Pt able to recall 3/3 back precautions   Cognition: Cognition Overall Cognitive Status: Within Functional Limits for tasks assessed Orientation Level: Oriented  X4 Cognition Arousal/Alertness: Awake/alert Behavior During Therapy: Anxious Overall Cognitive Status: Within Functional Limits for tasks assessed   Physical Exam: Blood pressure (!) 146/70, pulse 98, temperature 98 F (36.7 C), temperature source Oral, resp. rate 18, height 5' 8"  (1.727 m), weight 80.3 kg (177 lb), SpO2 95 %. Physical Exam  Vitals reviewed. HENT:  Head: Normocephalic.  Eyes: EOM are normal.  Neck: Normal range of motion. Neck supple. No thyromegaly present.  Cardiovascular: Normal rate, regular rhythm and normal heart sounds.  Respiratory: Effort normal and breath sounds normal. No respiratory distress.  GI: Soft. Bowel sounds are normal. She exhibits no distension.   Skin. Back incision is dressed Neurological: She is alert and oriented to person, place, and time.  Motor: B/l UE 5/5 deltoid, bicep, tricep, grip LLE: 4/5 HF, KE, 4+/5 ADF/PF RLE: HF, KE 2/5, ADF/PF 4-/5 Sensation diminished to light touch below the knees bilaterally. Tone has right ankle clonus, no evidence of clonus on the left, decreased deep tendon reflexes bilateral knees.   Right infra umbilical incision clean dry intact Left lumbar incision clean dry and intact, right paraspinal thoracic wound with Tegaderm and 2 x 2, serosanguineous drainage dressing        Lab Results Last 48 Hours         Results for orders placed or performed during the hospital encounter of 07/26/17 (from the past 48 hour(s))  Glucose, capillary     Status: Abnormal    Collection Time: 07/26/17  2:36 PM  Result Value Ref Range    Glucose-Capillary 181 (H) 65 - 99 mg/dL    Comment 1 Notify RN      Comment 2 Document in Chart    CBC     Status: Abnormal    Collection Time: 07/27/17  3:01 AM  Result Value Ref Range    WBC 7.9 4.0 - 10.5 K/uL    RBC 3.08 (L) 3.87 - 5.11 MIL/uL    Hemoglobin 9.2 (L) 12.0 - 15.0 g/dL    HCT 27.5 (L) 36.0 - 46.0 %    MCV 89.3 78.0 - 100.0 fL    MCH 29.9 26.0 - 34.0 pg    MCHC 33.5 30.0 - 36.0 g/dL    RDW 13.2 11.5 - 15.5 %    Platelets 168 150 - 400 K/uL  Basic Metabolic Panel     Status: Abnormal    Collection Time: 07/27/17  3:01 AM  Result Value Ref Range    Sodium 139 135 - 145 mmol/L    Potassium 4.6 3.5 - 5.1 mmol/L    Chloride 107 101 - 111 mmol/L    CO2 25 22 - 32 mmol/L    Glucose, Bld 135 (H) 65 - 99 mg/dL    BUN 8 6 - 20 mg/dL    Creatinine, Ser 0.85 0.44 - 1.00 mg/dL    Calcium 8.7 (L) 8.9 - 10.3 mg/dL    GFR calc non Af Amer >60 >60 mL/min    GFR calc Af Amer >60 >60 mL/min      Comment: (NOTE) The eGFR has been calculated using the CKD EPI equation. This calculation has not been validated in all clinical situations. eGFR's persistently <60 mL/min signify possible Chronic Kidney Disease.      Anion gap 7 5 - 15  Glucose, capillary     Status: Abnormal    Collection Time: 07/27/17  7:41 AM  Result Value Ref Range    Glucose-Capillary 136 (H) 65 - 99 mg/dL  Comment 1 Notify RN      Comment 2 Document in Chart    Glucose, capillary     Status: Abnormal    Collection Time: 07/27/17 11:44 AM  Result Value Ref Range    Glucose-Capillary 211 (H) 65 - 99 mg/dL    Comment 1 Notify RN      Comment 2 Document in Chart    Glucose, capillary     Status: Abnormal    Collection Time: 07/27/17  5:55 PM  Result Value Ref Range     Glucose-Capillary 169 (H) 65 - 99 mg/dL  Glucose, capillary     Status: Abnormal    Collection Time: 07/27/17  9:40 PM  Result Value Ref Range    Glucose-Capillary 193 (H) 65 - 99 mg/dL    Comment 1 Notify RN    CBC     Status: Abnormal    Collection Time: 07/28/17  3:14 AM  Result Value Ref Range    WBC 7.0 4.0 - 10.5 K/uL    RBC 3.10 (L) 3.87 - 5.11 MIL/uL    Hemoglobin 9.1 (L) 12.0 - 15.0 g/dL    HCT 27.9 (L) 36.0 - 46.0 %    MCV 90.0 78.0 - 100.0 fL    MCH 29.4 26.0 - 34.0 pg    MCHC 32.6 30.0 - 36.0 g/dL    RDW 13.3 11.5 - 15.5 %    Platelets 144 (L) 150 - 400 K/uL       Imaging Results (Last 48 hours)  Dg Lumbar Spine 2-3 Views   Result Date: 07/26/2017 CLINICAL DATA:  Lumbar spine surgery. EXAM: DG C-ARM GT 120 MIN; LUMBAR SPINE - 2-3 VIEW CONTRAST:  None. FLUOROSCOPY TIME:  Fluoroscopy Time:  None reported . COMPARISON:  CT 07/06/2017 . FINDINGS: Lumbar spine numbered as per prior CT . L2-L3, L3-L4, L4-L5 interbody fusion. L3 through S L3 through L scratched it L3 through S1 fusion. Anterior fusion L5-S1. Iliosacral effusion. Hardware intact. Anatomic alignment. IMPRESSION: Postsurgical changes lumbosacral spine. Anatomic alignment. Hardware intact. No acute abnormality. Electronically Signed   By: Marcello Moores  Register   On: 07/26/2017 15:32    Dg C-arm Gt 28 Min   Result Date: 07/26/2017 CLINICAL DATA:  Lumbar spine surgery. EXAM: DG C-ARM GT 120 MIN; LUMBAR SPINE - 2-3 VIEW CONTRAST:  None. FLUOROSCOPY TIME:  Fluoroscopy Time:  None reported . COMPARISON:  CT 07/06/2017 . FINDINGS: Lumbar spine numbered as per prior CT . L2-L3, L3-L4, L4-L5 interbody fusion. L3 through S L3 through L scratched it L3 through S1 fusion. Anterior fusion L5-S1. Iliosacral effusion. Hardware intact. Anatomic alignment. IMPRESSION: Postsurgical changes lumbosacral spine. Anatomic alignment. Hardware intact. No acute abnormality. Electronically Signed   By: Marcello Moores  Register   On: 07/26/2017 15:32     Dg Or Local Abdomen   Addendum Date: 07/26/2017   ADDENDUM REPORT: 07/26/2017 10:42 ADDENDUM: I spoke with Dr. Cyndy Freeze, who stated that the object in question represents a small vascular clip. Electronically Signed   By: Marijo Conception, M.D.   On: 07/26/2017 10:42    Addendum Date: 07/26/2017   ADDENDUM REPORT: 07/26/2017 10:30 ADDENDUM: Second image was obtained and the large metallic optic described over the left upper quadrant on prior exam is no longer visualized. However, there is noted a current metallic density seen projected over the left sacrum on both images which may represent retained foreign body. These results were called by telephone at the time of interpretation on 07/26/2017 at 10:28 am to  Carroll Kinds, RN, who verbally acknowledged these results and will contact the surgeon. Electronically Signed   By: Marijo Conception, M.D.   On: 07/26/2017 10:30    Result Date: 07/26/2017 CLINICAL DATA:  Instrument count. EXAM: OR LOCAL ABDOMEN COMPARISON:  Radiographs of October 27, 2016. FINDINGS: Status post surgical posterior fusion extending from L2-L5. Status post surgical anterior fusion of L5-S1. Phleboliths are noted in the pelvis. Large metallic object is seen projected over the left upper quadrant which most likely represents external object, but clinical correlation is recommended. No other foreign body is noted. IMPRESSION: Status post surgical fusion of lower lumbar spine. Large metallic object is seen projected over the left upper quadrant which most likely represents external object, but clinical correlation is recommended to rule out internal object. No other foreign body is noted. These results were called by telephone at the time of interpretation on 07/26/2017 at 10:09 am to Carroll Kinds, RN, who verbally acknowledged these results and will contact the surgeon. Electronically Signed: By: Marijo Conception, M.D. On: 07/26/2017 10:10             Medical Problem List and  Plan: 1.  Decreased functional mobility secondary to lumbar spondylosis with radiculopathy. Status post L5-S1 anterior retroperitoneal exposure with laminectomy and fusion 07/26/2017 as well as history of multiple thoracic lumbar surgeries. Back brace when out of bed 2.  DVT Prophylaxis/Anticoagulation: SCDs. Check vascular study 3. Pain Management: Celebrex 200 mg every 12 hours, Neurontin 300 mg 3 times a day, baclofen 10 mg every 8 hours, hydrocodone as needed 4. Mood: Provide emotional support 5. Neuropsych: This patient is capable of making decisions on her own behalf. 6. Skin/Wound Care: Routine skin checks 7. Fluids/Electrolytes/Nutrition: Routine I&O's with follow-up chemistries 8. Acute blood loss anemia. Follow-up CBC 9. Hypertension. Lisinopril 10 mg daily 10. Diabetes mellitus peripheral neuropathy. Hemoglobin A1c 6.7. Glucophage 500 mg twice a day. Check blood sugars before meals and at bedtime 11. Constipation. Laxative assistance 12. Remote tobacco abuse. Counseling       Post Admission Physician Evaluation: 1. 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 2. Patient is admitted to receive collaborative, interdisciplinary care between the physiatrist, rehab nursing staff, and therapy team. 3. Patient's level of medical complexity and substantial therapy needs in context of that medical necessity cannot be provided at a lesser intensity of care such as a SNF. 4. Patient has experienced substantial functional loss from his/her baseline which was documented above under the "Functional History" and "Functional Status" headings.  Judging by the patient's diagnosis, physical exam, and functional history, the patient has potential for functional progress which will result in measurable gains while on inpatient rehab.  These gains will be of substantial and practical use upon  discharge  in facilitating mobility and self-care at the household level. 5. Physiatrist will provide 24 hour management of medical needs as well as oversight of the therapy plan/treatment and provide guidance as appropriate regarding the interaction of the two. 6. The Preadmission Screening has been reviewed and patient status is unchanged unless otherwise stated above. 7. 24 hour rehab nursing will assist with bladder management, bowel management, safety, skin/wound care, disease management, medication administration, pain management and patient education  and help integrate therapy concepts, techniques,education, etc. 8. PT will assess and treat for/with: pre gait, gait training, endurance , safety, equipment, neuromuscular re education.   Goals are: Sup/minA.  9. OT will assess and treat for/with: ADLs, Cognitive perceptual skills, Neuromuscular re education, safety, endurance, equipment.   Goals are: Sup/MinA. Therapy may not proceed with showering this patient. 10. SLP will assess and treat for/with: NA.  Goals are: NA. 11. Case Management and Social Worker will assess and treat for psychological issues and discharge planning. 12. Team conference will be held weekly to assess progress toward goals and to determine barriers to discharge. 13. Patient will receive at least 3 hours of therapy per day at least 5 days per week. 14. ELOS: 16-19d       15. Prognosis:  excellent         Charlett Blake M.D. [facility] FAAPM&R (Sports Med, Neuromuscular Med) Diplomate Am Board of Electrodiagnostic Med  Cathlyn Parsons., PA-C 07/28/2017

## 2017-07-28 NOTE — PMR Pre-admission (Signed)
PMR Admission Coordinator Pre-Admission Assessment  Patient: Megan [name] is an 66 y.o., female MRN: [id number] DOB: Mar 27, 1951 Height: 5\' 8"  (172.7 cm) Weight: 80.3 kg (177 lb)              Insurance Information HMO:     PPO: yes     PCP:      IPA:      80/20:      OTHER:  PRIMARY: [employer] [place]      Policy#: [id number]      Subscriber: pt CM Name: Elspeth Cho      Phone#: [phone]     Fax#: [phone] Pre-Cert#: [id number] approved thru 11/28 when updates are due      Employer:  Benefits:  Phone #: [phone]     Name: 07/28/2017 Eff. Date: 09/13/2016     Deduct: $7000      Out of Pocket Max: $7350 includes deductible      Life Max: none CIR: 70%      SNF: 70% 60 days Outpatient: $75 co pay per visit     Co-Pay: 30 visits each PT, OT and SLP; has used all visits for PT and OT Home Health: 70%      Co-Pay:  Per medical neccesity with authorization DME: 70%     Co-Pay: 30% Providers: in network  SECONDARY: none     Medicaid Application Date:       Case Manager:  Disability Application Date:       Case Worker:   Emergency Facilities manager Information    Name Relation Home Work Canon [phone]  [phone]   Hug[place] Better Daughter [phone]  [phone]     Current Medical History  Patient Admitting Diagnosis: lumbosacral fusion  History of Present Illness:  HPI: Megan [name] a 66 y.o.right handed femalewith history of hypertension,remote tobacco abuse,diabetes mellitus, multiple prior thoracic or lumbar surgeriesand received inpatient rehabilitation services April 2018.  Patient with progressive weakness over the past few months hasrequired scooter for household and community ambulation secondary to bilateral lower extremity weakness. She needed assistance with meals and bathing.Admitted 07/26/2017 with progressive back pain and lower extremity weakness. X-rays and imaging revealed lumbosacral spondylosis with  radiculopathy. Underwent L5-S1 anterior retroperitoneal exposure L5-S1 laminectomy, L5-S1 posterior lateral fixation with fusion 07/26/2017 per Dr.Ditty.Hospital course pain management. Back brace when out of bed applied in sitting position. Acute blood loss anemia 9.1 and monitored. Close monitoring of any orthostasis when out of bed.   Past Medical History  Past Medical History:  Diagnosis Date  . Arthritis    "hands, back" (07/26/2017)  . Dyspnea    W/ PHYS CONDITION   . GERD (gastroesophageal reflux disease)   . History of bronchitis    "not since I quit smoking" (07/26/2017)  . History of kidney stones   . History of shingles   . Hyperlipidemia    takes Fish Oil daily  . Hypertension   . Neuromuscular disorder (HCC)    tingling toes  . 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[place]) 12/15/2016  . Pneumonia 2009  . PONV (postoperative nausea and vomiting)   . Restless leg   . Type 2 diabetes mellitus ([place])   . Uterine cancer ([place]) 1979   S/P hysterectomy  . Weakness    numbness and tingling in both feet r/t back    Family History  family history includes COPD in her father; Cirrhosis in her mother; Dementia in her mother; Diabetes in her brother  and mother.  Prior Rehab/Hospitalizations:  Has the patient had major surgery during 100 days prior to admission? No CIR admit 12/2016 went home with [facility] then Out pt therapy. See Dr. Letta Pate as an outpatient also  Current Medications   Current Facility-Administered Medications:  .  0.9 %  sodium chloride infusion, , Intravenous, Continuous, Ditty, Kevan Ny, MD, Stopped at 07/27/17 0500 .  acetaminophen (TYLENOL) tablet 325-650 mg, 325-650 mg, Oral, Q6H PRN, Ashok Pall, MD .  atorvastatin (LIPITOR) tablet 20 mg, 20 mg, Oral, QPM, Ditty, Kevan Ny, MD, 20 mg at 07/27/17 1737 .  baclofen (LIORESAL) tablet 10 mg, 10 mg, Oral, Q8H, Ditty, Kevan Ny, MD, 10 mg at 07/28/17 0559 .  bisacodyl (DULCOLAX) EC tablet 5 mg, 5 mg,  Oral, Daily PRN, Ditty, Kevan Ny, MD .  celecoxib (CELEBREX) capsule 200 mg, 200 mg, Oral, Q12H, Ditty, Kevan Ny, MD, 200 mg at 07/28/17 0917 .  cholecalciferol (VITAMIN D) tablet 2,000 Units, 2,000 Units, Oral, QPC lunch, Ditty, Kevan Ny, MD, 2,000 Units at 07/27/17 1425 .  docusate sodium (COLACE) capsule 100 mg, 100 mg, Oral, BID, Ditty, Kevan Ny, MD, 100 mg at 07/28/17 0918 .  gabapentin (NEURONTIN) capsule 300 mg, 300 mg, Oral, TID, Ditty, Kevan Ny, MD, 300 mg at 07/28/17 0917 .  HYDROcodone-acetaminophen (NORCO) 10-325 MG per tablet 1-2 tablet, 1-2 tablet, Oral, Q4H PRN, Ditty, Kevan Ny, MD, 2 tablet at 07/28/17 0559 .  HYDROmorphone (DILAUDID) injection 1 mg, 1 mg, Intravenous, Q3H PRN, Ditty, Kevan Ny, MD .  lisinopril (PRINIVIL,ZESTRIL) tablet 10 mg, 10 mg, Oral, Daily, Ditty, Kevan Ny, MD, 10 mg at 07/28/17 0917 .  menthol-cetylpyridinium (CEPACOL) lozenge 3 mg, 1 lozenge, Oral, PRN **OR** phenol (CHLORASEPTIC) mouth spray 1 spray, 1 spray, Mouth/Throat, PRN, Ditty, Kevan Ny, MD .  metFORMIN (GLUCOPHAGE) tablet 500 mg, 500 mg, Oral, BID WC, Ditty, Kevan Ny, MD, 500 mg at 07/28/17 0917 .  ondansetron (ZOFRAN) tablet 4 mg, 4 mg, Oral, Q6H PRN, 4 mg at 07/27/17 1054 **OR** ondansetron (ZOFRAN) injection 4 mg, 4 mg, Intravenous, Q6H PRN, Ditty, Kevan Ny, MD .  pantoprazole (PROTONIX) EC tablet 40 mg, 40 mg, Oral, Daily, Ditty, Kevan Ny, MD, 40 mg at 07/28/17 0917 .  senna (SENOKOT) tablet 8.6 mg, 1 tablet, Oral, BID, Ditty, Kevan Ny, MD, 8.6 mg at 07/28/17 0917 .  sodium phosphate (FLEET) 7-19 GM/118ML enema 1 enema, 1 enema, Rectal, Once PRN, Ditty, Kevan Ny, MD .  vancomycin (VANCOCIN) IVPB 1000 mg/200 mL premix, 1,000 mg, Intravenous, Q12H, Masters, Jake Church, RPH, Stopped at 07/28/17 1116 .  zolpidem (AMBIEN) tablet 5 mg, 5 mg, Oral, QHS PRN, Ditty, Kevan Ny, MD  Patients Current Diet: Diet Carb  Modified Fluid consistency: Thin; Room service appropriate? Yes  Precautions / Restrictions Precautions Precautions: Back, Fall Precaution Booklet Issued: Yes (comment) Precaution Comments: pt able to state 2/3 precautions and educated for all with handout given Spinal Brace: Lumbar corset, Applied in sitting position Other Brace/Splint: AFO RLE Restrictions Weight Bearing Restrictions: No   Has the patient had 2 or more falls or a fall with injury in the past year?No  Prior Activity Level Limited Community (1-2x/wk): Mod I with cane prior to fall 10/08/2016 and driving(Mow Mod I to supervision with RW and electric scooter)  Home Assistive Devices / Equipment Home Assistive Devices/Equipment: Eyeglasses, Dentures (specify type), Walker (specify type), Other (Comment), CBG Meter, Hospital bed, Shower chair with back, Grab bars around toilet, Grab bars in shower, Hand-held shower hose Home  Equipment: Civil engineer, contracting, [place] 2 wheels, Cane - single point, [employer] - 4 wheels, Grab bars - tub/shower  Prior Device Use: Indicate devices/aids used by the patient prior to current illness, exacerbation or injury? Walker and Transport planner  Prior Functional Level Prior Function Level of Independence: Needs assistance Gait / Transfers Assistance Needed: Pt has had progressive weakness over past few months - for past 3 weeks, has required scooter for household and community amb secondary to BLE weakness; requires intermittent assist from husband to transfer to/from. Prior to this, she required RW for amb.  ADL's / Homemaking Assistance Needed: Husband performs cooking/cleaning; provides intermittent assist for dressing, bathing. Pt indep with pericare. Comments: using scooter for mobility around the house. Spouse providing intermittent assist with ADLs. Prior to onset of weakness pt was ambulatory with SPC.   Self Care: Did the patient need help bathing, dressing, using the toilet or eating?  Needed  some help  Indoor Mobility: Did the patient need assistance with walking from room to room (with or without device)? Needed some help  Stairs: Did the patient need assistance with internal or external stairs (with or without device)? Needed some help  Functional Cognition: Did the patient need help planning regular tasks such as shopping or remembering to take medications? Needed some help  Current Functional Level Cognition  Overall Cognitive Status: Impaired/Different from baseline Orientation Level: Oriented X4 Safety/Judgement: Decreased awareness of safety General Comments: pt trying to sit before fully backed to surface despite cues    Extremity Assessment (includes Sensation/Coordination)  Upper Extremity Assessment: Overall WFL for tasks assessed  Lower Extremity Assessment: Defer to PT evaluation RLE Deficits / Details: noted fasiculations with RLE RLE Sensation: decreased light touch RLE Coordination: decreased gross motor LLE Deficits / Details: noted weakness 2+/5 LLE Coordination: decreased fine motor, decreased gross motor    ADLs  Overall ADL's : Needs assistance/impaired Eating/Feeding: Set up, Sitting Grooming: Min guard, Sitting, Wash/dry face Upper Body Bathing: Minimal assistance, Sitting Lower Body Bathing: Maximal assistance, Sit to/from stand Upper Body Dressing : Minimal assistance, Sitting Upper Body Dressing Details (indicate cue type and reason): to don brace and gown Lower Body Dressing: Maximal assistance, Sit to/from stand Lower Body Dressing Details (indicate cue type and reason): to don socks Toilet Transfer: Maximal assistance, +2 for physical assistance, Stand-pivot, BSC, RW Toilet Transfer Details (indicate cue type and reason): Simulated by stand pivot EOB>chair Functional mobility during ADLs: Maximal assistance, +2 for physical assistance, Rolling walker(for stand pivot only) General ADL Comments: Pt able to recall 3/3 back precautions     Mobility  Overal bed mobility: Needs Assistance Bed Mobility: Rolling, Sidelying to Sit Rolling: Min assist Sidelying to sit: Min assist General bed mobility comments: cues for sequence with assist to elevate trunk from surface    Transfers  Overall transfer level: Needs assistance Equipment used: Rolling walker (2 wheeled) Transfers: Sit to/from Stand Sit to Stand: Mod assist, +2 physical assistance Stand pivot transfers: Max assist, +2 physical assistance, From elevated surface General transfer comment: cues for posture, sequence and safety with assist to rise from surface.     Ambulation / Gait / Stairs / Wheelchair Mobility  Ambulation/Gait Ambulation/Gait assistance: Mod assist, +2 physical assistance, +2 safety/equipment Ambulation Distance (Feet): 3 Feet Assistive device: Rolling walker (2 wheeled) Gait Pattern/deviations: Shuffle General Gait Details: cues for sequence, posture and position in RW. Pt able to step a few feet with chair behind her but denied further distance stating nausea Gait velocity interpretation:  Below normal speed for age/gender    Posture / Balance Dynamic Sitting Balance Sitting balance - Comments: unable to reach outside of BOS utilized UE on bed to brace trunk due to pain Balance Overall balance assessment: Needs assistance Sitting-balance support: Bilateral upper extremity supported, Feet supported Sitting balance-Leahy Scale: Fair Sitting balance - Comments: unable to reach outside of BOS utilized UE on bed to brace trunk due to pain Standing balance support: During functional activity, Bilateral upper extremity supported Standing balance-Leahy Scale: Poor Standing balance comment: moderate to max assist for stability, significant reliance on Rw for UE support    Special needs/care consideration BiPAP/CPAP n/a CPM  N/a Continuous Drip IV no Dialysis  N/a Life Vest  N/a Oxygen n/a Special Bed n/a Trach Size n/a Wound Vac n/a Skin  surgical incision Bowel mgmt: continent LBM 07/25/2017 Bladder mgmt: incontinent ; external catheter Diabetic mgmt yes pta   Previous Home Environment Living Arrangements: Spouse/significant other(daughter)  Lives With: Spouse Available Help at Discharge: Family, Available 24 hours/day Type of Home: House Home Layout: One level Home Access: Ramped entrance Bathroom Shower/Tub: Tub/shower unit, Door, Architectural technologist: Programmer, systems: (with both RW and electric scooter) Home Care Services: (was receiving oupt therapy) Additional Comments: Spouse works but runs his own company and can come and go as needed.   Discharge Living Setting Plans for Discharge Living Setting: Patient's home, Lives with (comment)(spouse and daughter) Type of Home at Discharge: House Discharge Home Layout: One level Discharge Home Access: [place] entrance Discharge Bathroom Shower/Tub: Tub/shower unit, Curtain Discharge Bathroom Toilet: Standard Discharge Bathroom Accessibility: Yes How Accessible: Accessible via walker, Other (comment)(as well as electric scooter) Does the patient have any problems obtaining your medications?: No  Social/Family/Support Systems Patient Roles: Spouse, Parent Contact Information: [name], spouse Anticipated Caregiver: spouse and daughter Anticipated Ambulance person Information: see above Ability/Limitations of Caregiver: spouse works but owns his company with flexible schedule Caregiver Availability: 24/7 Discharge Plan Discussed with Primary Caregiver: Yes Is Caregiver In Agreement with Plan?: Yes Does Caregiver/Family have Issues with Lodging/Transportation while Pt is in Rehab?: No  Goals/Additional Needs Patient/Family Goal for Rehab: Mod I to min assist at wheelchair level PT, Min assist OT Expected length of stay: ELOS 17-20 days Pt/Family Agrees to Admission and willing to participate: Yes Program Orientation Provided & Reviewed with  Pt/Caregiver Including Roles  & Responsibilities: Yes  Decrease burden of Care through IP rehab admission: n/a  Possible need for SNF placement upon discharge:not anticipated  Patient Condition: This patient's condition remains as documented in the consult dated 07/27/2017, in which the Rehabilitation Physician determined and documented that the patient's condition is appropriate for intensive rehabilitative care in an inpatient rehabilitation facility. Will admit to inpatient rehab today.  Preadmission Screen Completed By:  Cleatrice Burke, 07/28/2017 11:38 AM ______________________________________________________________________   Discussed status with Dr. Letta Pate on 07/28/2017 at  1138 and received telephone approval for admission today.  Admission Coordinator:  Cleatrice Burke, time 3559 Date 07/28/2017

## 2017-07-28 NOTE — Care Management Note (Signed)
Case Management Note  Patient Details  Name: Megan [name] MRN: [id number] Date of Birth: July 07, 1951  Subjective/Objective:   Pt is a 66 y.o. female s/p L5-S1 ALIF, L5-S1 Laminectomy, L5-S1 Posterolateral fixation/fusion.  PTA, pt independent, lives at home with spouse.                   Action/Plan: PT/OT recommending CIR.  Pt medically stable for discharge and has been accepted for admission.  Insurance authorization has been received.  Plan transfer to CIR later today.    Expected Discharge Date:  07/28/17               Expected Discharge Plan:  [place]  In-House Referral:     Discharge planning Services  CM Consult  Post Acute Care Choice:    Choice offered to:     DME Arranged:    DME Agency:     HH Arranged:    HH Agency:     Status of Service:  Completed, signed off  If discussed at [employer], dates discussed:    Additional Comments:  Reinaldo Raddle, RN, BSN  Trauma/Neuro ICU Case Manager [phone]

## 2017-07-28 NOTE — Progress Notes (Signed)
Physical Therapy Treatment Patient Details Name: Megan [name] MRN: [id number] DOB: 01-13-1951 Today's Date: 07/28/2017    History of Present Illness Pt is a 66 y.o. female s/p L5-S1 ALIF, L5-S1 Laminectomy, L5-S1 Posterolateral fixation/fusion. PMHx: Arthritis, GERD, Hx of shingles, Hyperlipidemia, DM 2.    PT Comments    Pt pleasant and willing to attempt mobility today with limited functional mobility limited by dizziness, nausea and fatigue. Pt continues to require 2 person assist and unable to ambulate significant difference. Pt with min assist to don brace and mod assist to don AFO. Pt educated for all precautions and handout provided. Will continue to follow.   BP supine 120/62, 133/67 sitting, 103/50 standing  Follow Up Recommendations  CIR;Supervision/Assistance - 24 hour     Equipment Recommendations  None recommended by PT    Recommendations for Other Services       Precautions / Restrictions Precautions Precautions: Back;Fall Precaution Booklet Issued: Yes (comment) Precaution Comments: pt able to state 2/3 precautions and educated for all with handout given Required Braces or Orthoses: Spinal Brace;Other Brace/Splint Spinal Brace: Lumbar corset;Applied in sitting position Other Brace/Splint: AFO RLE    Mobility  Bed Mobility Overal bed mobility: Needs Assistance Bed Mobility: Rolling;Sidelying to Sit Rolling: Min assist Sidelying to sit: Min assist       General bed mobility comments: cues for sequence with assist to elevate trunk from surface  Transfers Overall transfer level: Needs assistance   Transfers: Sit to/from Stand Sit to Stand: Mod assist;+2 physical assistance         General transfer comment: cues for posture, sequence and safety with assist to rise from surface.   Ambulation/Gait Ambulation/Gait assistance: Mod assist;+2 physical assistance;+2 safety/equipment Ambulation Distance (Feet): 3 Feet Assistive device: Rolling walker  (2 wheeled) Gait Pattern/deviations: Shuffle   Gait velocity interpretation: Below normal speed for age/gender General Gait Details: cues for sequence, posture and position in RW. Pt able to step a few feet with chair behind her but denied further distance stating nausea   Stairs            Wheelchair Mobility    Modified Rankin (Stroke Patients Only)       Balance Overall balance assessment: Needs assistance   Sitting balance-Leahy Scale: Fair       Standing balance-Leahy Scale: Poor                              Cognition Arousal/Alertness: Awake/alert Behavior During Therapy: Anxious Overall Cognitive Status: Impaired/Different from baseline Area of Impairment: Safety/judgement                         Safety/Judgement: Decreased awareness of safety     General Comments: pt trying to sit before fully backed to surface despite cues      Exercises      General Comments        Pertinent Vitals/Pain Faces Pain Scale: Hurts whole lot Pain Location: back Pain Descriptors / Indicators: Aching;Guarding;Grimacing Pain Intervention(s): Limited activity within patient's tolerance;Repositioned;Monitored during session;Premedicated before session    Home Living                      Prior Function            PT Goals (current goals can now be found in the care plan section) Progress towards PT goals: Progressing toward goals  Frequency    Min 5X/week      PT Plan Current plan remains appropriate    Co-evaluation              AM-PAC PT "6 Clicks" Daily Activity  Outcome Measure  Difficulty turning over in bed (including adjusting bedclothes, sheets and blankets)?: Unable Difficulty moving from lying on back to sitting on the side of the bed? : Unable Difficulty sitting down on and standing up from a chair with arms (e.g., wheelchair, bedside commode, etc,.)?: Unable Help needed moving to and from a bed to  chair (including a wheelchair)?: A Lot Help needed walking in hospital room?: A Lot Help needed climbing 3-5 steps with a railing? : Total 6 Click Score: 8    End of Session Equipment Utilized During Treatment: Gait belt;Back brace Activity Tolerance: Patient limited by fatigue Patient left: in chair;with call bell/phone within reach;with family/visitor present Nurse Communication: Mobility status;Precautions PT Visit Diagnosis: Difficulty in walking, not elsewhere classified (R26.2)     Time: 6440-3474 PT Time Calculation (min) (ACUTE ONLY): 21 min  Charges:  $Therapeutic Activity: 8-22 mins                    G Codes:       Elwyn Reach, [place]    [place] 07/28/2017, 9:17 AM

## 2017-07-28 NOTE — Progress Notes (Signed)
I met with pt and her daughter at bedside to discuss goals and expectations of an inpt rehab admission . They prefer an inpt rehab rather than SNF or HH. Previously admitted 12/2016. I have insurance approval and contacted Dr. Cyndy Freeze for d/c. RN CM and SW \made aware. I will make the arrangements to admit today. [phone]

## 2017-07-29 ENCOUNTER — Inpatient Hospital Stay (HOSPITAL_COMMUNITY): Payer: BLUE CROSS/BLUE SHIELD

## 2017-07-29 ENCOUNTER — Encounter (HOSPITAL_COMMUNITY): Payer: Self-pay | Admitting: Neurological Surgery

## 2017-07-29 ENCOUNTER — Inpatient Hospital Stay (HOSPITAL_COMMUNITY): Payer: BLUE CROSS/BLUE SHIELD | Admitting: Physical Therapy

## 2017-07-29 ENCOUNTER — Inpatient Hospital Stay (HOSPITAL_COMMUNITY): Payer: BLUE CROSS/BLUE SHIELD | Admitting: Occupational Therapy

## 2017-07-29 DIAGNOSIS — M7989 Other specified soft tissue disorders: Secondary | ICD-10-CM

## 2017-07-29 LAB — COMPREHENSIVE METABOLIC PANEL
ALBUMIN: 2.9 g/dL — AB (ref 3.5–5.0)
ALT: 16 U/L (ref 14–54)
AST: 21 U/L (ref 15–41)
Alkaline Phosphatase: 74 U/L (ref 38–126)
Anion gap: 7 (ref 5–15)
BUN: 9 mg/dL (ref 6–20)
CHLORIDE: 104 mmol/L (ref 101–111)
CO2: 27 mmol/L (ref 22–32)
CREATININE: 0.78 mg/dL (ref 0.44–1.00)
Calcium: 8.5 mg/dL — ABNORMAL LOW (ref 8.9–10.3)
GFR calc Af Amer: >60 mL/min (ref >60)
Glucose, Bld: 172 mg/dL — ABNORMAL HIGH (ref 65–99)
POTASSIUM: 4 mmol/L (ref 3.5–5.1)
SODIUM: 138 mmol/L (ref 135–145)
Total Bilirubin: 0.8 mg/dL (ref 0.3–1.2)
Total Protein: 5.5 g/dL — ABNORMAL LOW (ref 6.5–8.1)

## 2017-07-29 LAB — GLUCOSE, CAPILLARY
GLUCOSE-CAPILLARY: 167 mg/dL — AB (ref 65–99)
GLUCOSE-CAPILLARY: 162 mg/dL — AB (ref 65–99)
Glucose-Capillary: 136 mg/dL — ABNORMAL HIGH (ref 65–99)
Glucose-Capillary: 170 mg/dL — ABNORMAL HIGH (ref 65–99)

## 2017-07-29 LAB — CBC WITH DIFFERENTIAL/PLATELET
Basophils Absolute: 0 10*3/uL (ref 0.0–0.1)
Basophils Relative: 0 %
EOS PCT: 5 %
Eosinophils Absolute: 0.3 10*3/uL (ref 0.0–0.7)
HEMATOCRIT: 27.4 % — AB (ref 36.0–46.0)
HEMOGLOBIN: 9 g/dL — AB (ref 12.0–15.0)
LYMPHS ABS: 1 10*3/uL (ref 0.7–4.0)
LYMPHS PCT: 15 %
MCH: 29.3 pg (ref 26.0–34.0)
MCHC: 32.8 g/dL (ref 30.0–36.0)
MCV: 89.3 fL (ref 78.0–100.0)
Monocytes Absolute: 0.8 10*3/uL (ref 0.1–1.0)
Monocytes Relative: 12 %
NEUTROS ABS: 4.5 10*3/uL (ref 1.7–7.7)
Neutrophils Relative %: 68 %
PLATELETS: 154 10*3/uL (ref 150–400)
RBC: 3.07 MIL/uL — AB (ref 3.87–5.11)
RDW: 13.2 % (ref 11.5–15.5)
WBC: 6.6 10*3/uL (ref 4.0–10.5)

## 2017-07-29 LAB — POCT I-STAT 4, (NA,K, GLUC, HGB,HCT)
Glucose, Bld: 196 mg/dL — ABNORMAL HIGH (ref 65–99)
HEMATOCRIT: 32 % — AB (ref 36.0–46.0)
Hemoglobin: 10.9 g/dL — ABNORMAL LOW (ref 12.0–15.0)
POTASSIUM: 3.9 mmol/L (ref 3.5–5.1)
Sodium: 138 mmol/L (ref 135–145)

## 2017-07-29 MED ORDER — BACLOFEN 5 MG HALF TABLET
15.0000 mg | ORAL_TABLET | Freq: Four times a day (QID) | ORAL | Status: DC
  Administered 2017-07-29 – 2017-07-30 (×4): 15 mg via ORAL
  Filled 2017-07-29 (×5): qty 1

## 2017-07-29 NOTE — Evaluation (Signed)
Physical Therapy Assessment and Plan  Patient Details  Name: Megan [name] MRN: [id number] Date of Birth: Sep 18, 1950  PT Diagnosis: Abnormality of gait, Ataxia, Coordination disorder, Difficulty walking, Impaired sensation, Muscle spasms and Paraplegia Rehab Potential: Good ELOS: 21-24 days   Today's Date: 07/29/2017 PT Individual Time: 1300-1415 PT Individual Time Calculation (min): 75 min    Problem List:  Patient Active Problem List   Diagnosis Date Noted  . Radiculopathy 07/28/2017  . Fusion of spine, lumbosacral region   . Acute blood loss anemia   . Benign essential HTN   . Diabetes mellitus type 2 in obese ([place])   . Lumbosacral spondylosis with radiculopathy 07/26/2017  . Sensation of pressure in bladder area 01/31/2017  . Paraplegia, incomplete ([place]) 01/07/2017  . Neurogenic bladder 12/27/2016  . 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/27/2016  . Myelopathy ([place]) 12/21/2016  . Surgery, elective   . Diabetes mellitus type 2 in nonobese (HCC)   . History of lumbar fusion   . History of fusion of cervical spine   . Neuropathic pain   . Post-operative pain   . 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 Paraparesis ([place]) 12/15/2016  . Pedal edema 12/14/2016  . Type 2 diabetes mellitus without complication, without long-term current use of insulin ([place]) 11/02/2016  . Hyperlipidemia 11/02/2016  . Essential hypertension 11/02/2016  . 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    Past Medical History:  Past Medical History:  Diagnosis Date  . Arthritis    "hands, back" (07/26/2017)  . Dyspnea    W/ PHYS CONDITION   . GERD (gastroesophageal reflux disease)   . History of bronchitis    "not since I quit smoking" (07/26/2017)  . History of kidney stones   . History of shingles   . Hyperlipidemia    takes Fish Oil daily  . Hypertension   . Neuromuscular disorder (HCC)    tingling toes  .  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[place]) 12/15/2016  . Pneumonia 2009  . PONV (postoperative nausea and vomiting)   . Restless leg   . Type 2 diabetes mellitus ([place])   . Uterine cancer ([place]) 1979   S/P hysterectomy  . Weakness    numbness and tingling in both feet r/t back   Past Surgical History:  Past Surgical History:  Procedure Laterality Date  . ANTERIOR CERVICAL DECOMP/DISCECTOMY FUSION  2001  . APPLICATION OF ROBOTIC ASSISTANCE FOR SPINAL PROCEDURE  12/19/2016   Performed by Ditty, Kevan Ny, MD at [facility]  . BACK SURGERY    . BLADDER SUSPENSION  1991   tack  . INGUINAL HERNIA REPAIR Right 1991  . LEFT THORACIC TEN  COSTO TRANSVERSECTOMY , LEFT THORACIC TEN-ELEVEN DISCECTOMY , THORACIC NINE -TWELVE  POSTERIOR FIXATION AND FUSION Left 12/19/2016   Performed by Ditty, Kevan Ny, MD at [facility]  2016; 2017; 07/26/2017   L4-5; L2-3; L5-S1  . Lumbar two-three  Lumbar three-four Posterior lumbar interbody fusion with augmentation N/A 04/27/2016   Performed by Leeroy Cha, MD at [facility] NEURO ORS  . POSTERIOR LUMBAR INTERBODY FUSION ,DECOMPRESSION LUMBAR FOUR,LUMBAR FIVEL N/A 06/10/2015   Performed by Leeroy Cha, MD at [facility] NEURO ORS  . TONSILLECTOMY AND ADENOIDECTOMY  1959  . TUMOR EXCISION     WERTHIN'S TUMORS BOTH SIDES OF NECK  . VAGINAL HYSTERECTOMY  1979    Assessment & Plan Clinical Impression: [name] is a 66 y.o. right handed  female with history of hypertension, remote tobacco abuse, diabetes mellitus, multiple prior thoracic or lumbar surgeries and received inpatient rehabilitation services April 2018. Patient with progressive weakness over the past few months has required scooter for household and community ambulation secondary to bilateral lower extremity weakness. She needed assistance with meals and bathing.  Admitted 07/26/2017 with progressive back pain and lower extremity weakness. X-rays and imaging revealed lumbosacral spondylosis with  radiculopathy. Underwent L5-S1 anterior retroperitoneal exposure L5-S1 laminectomy, L5-S1 posterior lateral fixation with fusion 07/26/2017 per Dr. Cyndy Freeze. Hospital course pain management. Back brace when out of bed applied in sitting position. Acute blood loss anemia 9.1 and monitored. Close monitoring of any orthostasis when out of bed.  Patient transferred to CIR on 07/28/2017 .   Patient currently requires max to total assist with mobility secondary to muscle weakness and muscle paralysis, impaired timing and sequencing, abnormal tone, unbalanced muscle activation, ataxia, decreased coordination and decreased motor planning and decreased sitting balance, decreased standing balance, decreased postural control and decreased balance strategies.  Prior to hospitalization, patient was supervision>mod I with mobility and lived with Spouse in a House home.  Home access is  Ramped entrance.  Patient will benefit from skilled PT intervention to maximize safe functional mobility, minimize fall risk and decrease caregiver burden for planned discharge home with 24 hour assist.  Anticipate patient will benefit from follow up [facility] at discharge.  PT - End of Session Activity Tolerance: Tolerates 30+ min activity with multiple rests Endurance Deficit: Yes PT Assessment Rehab Potential (ACUTE/IP ONLY): Good PT Patient demonstrates impairments in the following area(s): Balance;Pain;Endurance;Motor;Sensory PT Transfers Functional Problem(s): Bed Mobility;Bed to Chair;Car;Furniture PT Locomotion Functional Problem(s): Stairs;Wheelchair Mobility;Ambulation PT Plan PT Intensity: Minimum of 1-2 x/day ,45 to 90 minutes PT Frequency: 5 out of 7 days PT Duration Estimated Length of Stay: 21-24 days PT Treatment/Interventions: Ambulation/gait training;Community reintegration;DME/adaptive equipment instruction;Neuromuscular re-education;Psychosocial support;Stair training;UE/LE Strength taining/ROM;Wheelchair  propulsion/positioning;UE/LE Coordination activities;Therapeutic Activities;Balance/vestibular training;Discharge planning;Cognitive remediation/compensation;Functional mobility training;Patient/family education;Splinting/orthotics;Therapeutic Exercise PT Transfers Anticipated Outcome(s): supervision PT Locomotion Anticipated Outcome(s): min assist ambulatory, mod I w/c PT Recommendation Follow Up Recommendations: Home health PT;24 hour supervision/assistance Patient destination: Home Equipment Recommended: To be determined  Skilled Therapeutic Intervention No c/o pain at start of session, towards end of session pt began having LE spasms with 6/10 pain, RN notified for pain medication.  PT provided education on PT evaluation, rehab process, goals of care, and ELOS.  Pt requiring max assist for sit<>stand in stedy, from elevated bed and from elevated stedy seat.  Able to complete slide board transfer with education and close supervision/min guard on level surface.  W/C propulsion supervision. Unable to safely assess gait or stairs this session.  Pt handoff to OT in therapy gym.   PT Evaluation Precautions/Restrictions Precautions Precautions: Back;Fall Required Braces or Orthoses: Spinal Brace;Other Brace/Splint Spinal Brace: Lumbar corset;Applied in sitting position Other Brace/Splint: AFO RLE Pain Pain Assessment Pain Assessment: 0-10 Pain Score: 6  Pain Location: Back Pain Descriptors / Indicators: Spasm Pain Intervention(s): Emotional support;RN made aware Home Living/Prior Functioning Home Living Living Arrangements: Spouse/significant other Available Help at Discharge: Family;Available 24 hours/day Type of Home: House Home Access: Ramped entrance Home Layout: One level Additional Comments: Spouse works but runs his own company and can come and go as needed.   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  Praxis: Intact  Cognition Overall Cognitive Status: Within Functional Limits for tasks assessed Arousal/Alertness: Awake/alert Orientation Level: Oriented X4 Safety/Judgment: Appears intact Sensation Sensation Light Touch: Impaired Detail Light Touch Impaired Details: Absent RLE Additional Comments: pt with deep touch sensation in RLE Coordination Gross Motor Movements are Fluid and Coordinated: No Fine Motor Movements are Fluid and Coordinated: No Coordination and Movement Description: paraparesis Motor  Motor Motor: Paraplegia Motor - Skilled Clinical Observations: BLE spasms towards end of session, paraparesis RLE>LLE  Mobility Bed Mobility Bed Mobility: Supine to Sit;Rolling Right Rolling Right: 3: Mod assist;With rail Supine to Sit: 3: Mod assist;With rails;HOB elevated Transfers Sit to Stand: 2: Max assist;From elevated surface;With upper extremity assist Sit to Stand Details: Manual facilitation for weight shifting;Manual facilitation for placement Stand to Sit: 2: Max assist Lateral/Scoot Transfers: With slide board;4: Min guard Locomotion  Ambulation Ambulation: No Gait Gait: No Stairs / Additional Locomotion Stairs: No Architect: Yes Wheelchair Assistance: 5: Careers information officer: Both upper extremities Distance: 200  Trunk/Postural Assessment  Cervical Assessment Cervical Assessment: Within Scientist, physiological Assessment: Within Functional Limits Lumbar Assessment Lumbar Assessment: Exceptions to WFL(LSO) Postural Control Postural Control: Within Functional Limits  Extremity Assessment      RLE Assessment RLE Assessment: Exceptions to [facility] RLE Strength Right Hip Flexion: 3/5 Right Knee Flexion: 4/5 Right Knee  Extension: 3-/5 LLE Assessment LLE Assessment: Within Functional Limits(4/5 proximal to distal)   See Function Navigator for Current Functional Status.   Refer to Care Plan for Long Term Goals  Recommendations for other services: None   Discharge Criteria: Patient will be discharged from PT if patient refuses treatment 3 consecutive times without medical reason, if treatment goals not met, if there is a change in medical status, if patient makes no progress towards goals or if patient is discharged from hospital.  The above assessment, treatment plan, treatment alternatives and goals were discussed and mutually agreed upon: by patient  Michel Santee 07/29/2017, 3:21 PM

## 2017-07-29 NOTE — Progress Notes (Signed)
Bilateral lower extremity venous duplex has been completed. Negative for DVT.  07/29/17 3:53 PM Megan [name] RVT

## 2017-07-29 NOTE — Progress Notes (Signed)
[facility] Individual Statement of Services  Patient Name:  Megan [name]  Date:  07/29/2017  Welcome to the [facility].  Our goal is to provide you with an individualized program based on your diagnosis and situation, designed to meet your specific needs.  With this comprehensive rehabilitation program, you will be expected to participate in at least 3 hours of rehabilitation therapies Monday-Friday, with modified therapy programming on the weekends.  Your rehabilitation program will include the following services:  Physical Therapy (PT), Occupational Therapy (OT), 24 hour per day rehabilitation nursing, Case Management (Social Worker), Rehabilitation Medicine, Nutrition Services and Pharmacy Services  Weekly team conferences will be held on Wednesdays to discuss your progress.  Your Social Worker will talk with you frequently to get your input and to update you on team discussions.  Team conferences with you and your family in attendance may also be held.  Expected length of stay:  21 to 24 days  Overall anticipated outcome:  Minimal Assistance with moderate assistance with stairs  Depending on your progress and recovery, your program may change. Your Social Worker will coordinate services and will keep you informed of any changes. Your Social Worker's name and contact numbers are listed  below.  The following services may also be recommended but are not provided by the [facility] will be made to provide these services after discharge if needed.  Arrangements include referral to agencies that provide these services.  Your insurance has been verified to be:  [employer] Your primary doctor is:  Alma Friendly, AGNP  Pertinent information will be shared with your doctor and your insurance  company.  Social Worker:  Alfonse Alpers, LCSW  [phone] or [phone]  Information discussed with and copy given to patient by: Trey Sailors, 07/29/2017, 12:14 PM

## 2017-07-29 NOTE — Evaluation (Signed)
Occupational Therapy Assessment and Plan  Patient Details  Name: Megan [name] MRN: [id number] Date of Birth: [birth year]  OT Diagnosis: abnormal posture, acute pain, lumbago (low back pain) and muscle weakness (generalized) Rehab Potential:   ELOS: 21-24   Today's Date: 07/29/2017 OT Individual Time: 1638-4536 OT Individual Time Calculation (min): 73 min     Problem List:  Patient Active Problem List   Diagnosis Date Noted  . Radiculopathy 07/28/2017  . Fusion of spine, lumbosacral region   . Acute blood loss anemia   . Benign essential HTN   . Diabetes mellitus type 2 in obese ([place])   . Lumbosacral spondylosis with radiculopathy 07/26/2017  . Sensation of pressure in bladder area 01/31/2017  . Paraplegia, incomplete ([place]) 01/07/2017  . Neurogenic bladder 12/27/2016  . 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/27/2016  . Myelopathy ([place]) 12/21/2016  . Surgery, elective   . Diabetes mellitus type 2 in nonobese (HCC)   . History of lumbar fusion   . History of fusion of cervical spine   . Neuropathic pain   . Post-operative pain   . 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 Paraparesis ([place]) 12/15/2016  . Pedal edema 12/14/2016  . Type 2 diabetes mellitus without complication, without long-term current use of insulin ([place]) 11/02/2016  . Hyperlipidemia 11/02/2016  . Essential hypertension 11/02/2016  . 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    Past Medical History:  Past Medical History:  Diagnosis Date  . Arthritis    "hands, back" (07/26/2017)  . Dyspnea    W/ PHYS CONDITION   . GERD (gastroesophageal reflux disease)   . History of bronchitis    "not since I quit smoking" (07/26/2017)  . History of kidney stones   . History of shingles   . Hyperlipidemia    takes Fish Oil daily  . Hypertension   . Neuromuscular disorder (HCC)    tingling toes  . 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[place])  12/15/2016  . Pneumonia 2009  . PONV (postoperative nausea and vomiting)   . Restless leg   . Type 2 diabetes mellitus ([place])   . Uterine cancer ([place]) 1979   S/P hysterectomy  . Weakness    numbness and tingling in both feet r/t back   Past Surgical History:  Past Surgical History:  Procedure Laterality Date  . ANTERIOR CERVICAL DECOMP/DISCECTOMY FUSION  2001  . APPLICATION OF ROBOTIC ASSISTANCE FOR SPINAL PROCEDURE  12/19/2016   Procedure: APPLICATION OF ROBOTIC ASSISTANCE FOR SPINAL PROCEDURE;  Surgeon: Kevan Ny Ditty, MD;  Location: [facility];  Service: Neurosurgery;;  . BACK SURGERY    . BLADDER SUSPENSION  1991   tack  . INGUINAL HERNIA REPAIR Right 1991  . LUMBAR FUSION  2016; 2017; 07/26/2017   L4-5; L2-3; L5-S1  . [place]  . TUMOR EXCISION     WERTHIN'S TUMORS BOTH SIDES OF NECK  . 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 Per chart review, husband, and patient,patient lives with spouse. One level home with ramped entrance. Spouse is self-employed and can assist as neededas well as assistance from a neighbor and her daughter. Patient with progressive  weakness over the past few months hasrequired scooter for household and community ambulation secondary to bilateral lower extremity weakness. She needed assistance with meals and bathing.  Last PMR visit August 2018, at that time still had right hip flexor and ankle dorsiflexor weakness.  Was still taking gabapentin for paresthesias, patient was completing outpatient therapy when she noted increasing left lower extremity weakness,  she was evaluated by neurosurgery and on 06/05/2017 underwent thoracic and lumbar MRI which showed a new left L5-S1 protrusion compressing S1 nerve root.Admitted 07/26/2017 with progressive back pain and lower extremity weakness. X-rays and imaging revealed lumbosacral spondylosis with radiculopathy. Underwent L5-S1 anterior retroperitoneal exposure L5-S1 laminectomy, L5-S1 posterior lateral fixation with fusion 07/26/2017 per Dr.Ditty.Hospital course pain management. Back brace when out of bed applied in sitting position. Acute blood loss anemia 9.1and monitored.Close monitoring of any orthostasis when out of bed.Physical and occupational therapy evaluations completed with recommendations of physical medicine rehabilitation consult.Patient was admitted for a comprehensive rehabilitation program  Patient currently requires max with basic self-care skills secondary to muscle weakness, decreased cardiorespiratoy endurance, decreased coordination and decreased standing balance and decreased balance strategies.  Prior to hospitalization, patient could complete ADL with min.  Patient will benefit from skilled intervention to decrease level of assist with basic self-care skills and increase independence with basic self-care skills prior to discharge home with care partner.  Anticipate patient will require 24 hour supervision and minimal physical assistance and follow up outpatient.  OT - End of Session Activity Tolerance: Tolerates 10 - 20 min activity with multiple rests Endurance Deficit: Yes OT Assessment Rehab Potential (ACUTE ONLY): Good OT Patient demonstrates impairments in the following area(s): Balance;Endurance;Motor;Sensory OT Basic ADL's Functional Problem(s): Grooming;Bathing;Dressing;Toileting OT Transfers Functional Problem(s): Toilet;Tub/Shower OT Additional Impairment(s): None OT Plan OT Intensity: Minimum of 1-2 x/day, 45 to 90 minutes OT Frequency: 5 out of 7 days OT  Duration/Estimated Length of Stay: 21-24 OT Treatment/Interventions: Balance/vestibular training;Community reintegration;Discharge planning;DME/adaptive equipment instruction;Functional electrical stimulation;Psychosocial support;Therapeutic Exercise;Therapeutic Activities;Functional mobility training;Self Care/advanced ADL retraining;Pain management;Patient/family education;Skin care/wound managment;UE/LE Strength taining/ROM;UE/LE Coordination activities;Wheelchair propulsion/positioning OT Self Feeding Anticipated Outcome(s): MOD I OT Basic Self-Care Anticipated Outcome(s): S UB dressing; MIN A LB dressing OT Toileting Anticipated Outcome(s): MIN A OT Bathroom Transfers Anticipated Outcome(s): MIN A OT Recommendation Patient destination: Home Follow Up Recommendations: Outpatient OT Equipment Recommended: None recommended by OT Equipment Details: potentially TTB if pt willing to purchase   Skilled Therapeutic Intervention 1;1. Pt with spasm/clonus on RLE causeing pain and RN administered medication. Pt familiar to this OT and rehab process. Pt able to recall 3/3 precautions. Pt requires min VC and MAX A supine>sitting EOB. Pt bathes sitting EOB with A to wash B feet without AE. OT educates/provides pt with LHSS for future use. Pt leans laterally with VC for technique and OT washes buttocks. Pt dons bra with A to fasten, button up shirt and brace. Pt uses reacher to thread BLE into pants and Pt sit to stand with RW with MAX A and +2 advances pants past hips. Pt grooms seated EOB. Pt sit to stand in stedy with MAX A of 1 2x with VC for terminal hip extension. Exited session with pt semi reclined in bed with call light in reach and all needs met.   Pt vitals assess throughout session Supine before activity: 163/69 Sitting EOB 155/55 After standing sitting EOB 123/50  OT Evaluation Precautions/Restrictions  Precautions Precautions: Back;Fall Precaution Booklet Issued: Yes  (comment) Precaution Comments: pt able to 3/3 back precautions Required Braces  or Orthoses: Spinal Brace;Other Brace/Splint Spinal Brace: Lumbar corset;Applied in sitting position Restrictions Weight Bearing Restrictions: No General   Vital Signs Therapy Vitals Temp: 98.1 F (36.7 C) Temp Source: Oral Pulse Rate: 85 Resp: 18 BP: 139/65 Patient Position (if appropriate): Sitting Oxygen Therapy SpO2: 98 % O2 Device: Not Delivered Pain Pain Assessment Faces Pain Scale: Hurts little more Pain Location: Back Pain Orientation: Mid;Lower Home Living/Prior Functioning Home Living Family/patient expects to be discharged to:: Private residence Living Arrangements: Spouse/significant other Available Help at Discharge: Family, Available 24 hours/day Home Access: Ramped entrance Home Layout: One level Bathroom Shower/Tub: Tub/shower unit, Door, Architectural technologist: Standard Additional Comments: Spouse works but runs his own company and can come and go as needed.   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 Spouse providing intermittent assist with ADLs. Prior to onset of weakness pt was ambulatory with SPC.  ADL   Vision Baseline Vision/History: Wears glasses Patient Visual Report: No change from baseline Perception  Perception: Within Functional Limits Praxis Praxis: Intact Cognition Overall Cognitive Status: Within Functional Limits for tasks assessed Arousal/Alertness: Awake/alert Year: 2018 Month: November Day of Week: Correct Memory: Appears intact Immediate Memory Recall: Sock;Blue;Bed Memory Recall: Blue;Sock;Bed Memory Recall Sock: Without Cue Memory Recall Blue: Without Cue Memory Recall Bed: Without Cue Attention:  Sustained Sustained Attention: Appears intact Sensation Sensation Light Touch: Appears Intact(UEs) Proprioception: Appears Intact Coordination Gross Motor Movements are Fluid and Coordinated: Yes(UEs) Fine Motor Movements are Fluid and Coordinated: Yes(UEs) Motor  Motor Motor: Other (comment) Motor - Skilled Clinical Observations: clonus/spasjm in RLE during session and pt reports alson in LLE Mobility  Transfers Transfers: Sit to Stand;Stand to Sit Sit to Stand: 1: +2 Total assist Sit to Stand: Patient Percentage: 20% Stand to Sit: 2: Max assist  Trunk/Postural Assessment  Cervical Assessment Cervical Assessment: Exceptions to WFL(head forward) Thoracic Assessment Thoracic Assessment: Exceptions to WFL(back precuaitons) Lumbar Assessment Lumbar Assessment: Exceptions to WFL(back precautions ) Postural Control Postural Control: Within Functional Limits  Balance Balance Balance Assessed: Yes Dynamic Sitting Balance Dynamic Sitting - Level of Assistance: 5: Stand by assistance Sitting balance - Comments: unable to reach outside of BOS utilized UE on bed to brace trunk due to pain Dynamic Standing Balance Dynamic Standing - Level of Assistance: 2: Max assist Dynamic Standing - Comments: +2 advances pants past hips Extremity/Trunk Assessment RUE Assessment RUE Assessment: Exceptions to WFL(generalized weakness) LUE Assessment LUE Assessment: Exceptions to WFL(generalized weakness)   See Function Navigator for Current Functional Status.   Refer to Care Plan for Long Term Goals  Recommendations for other services: Therapeutic Recreation  Pet therapy   Discharge Criteria: Patient will be discharged from OT if patient refuses treatment 3 consecutive times without medical reason, if treatment goals not met, if there is a change in medical status, if patient makes no progress towards goals or if patient is discharged from hospital.  The above assessment, treatment plan,  treatment alternatives and goals were discussed and mutually agreed upon: by patient  Tonny Branch 07/29/2017, 8:15 AM

## 2017-07-29 NOTE — Progress Notes (Signed)
Subjective/Complaints: BLE spasms woke her up this am ~2am  ROS-  No CP, SOB, N/V/D  Objective: Vital Signs: Blood pressure 139/65, pulse 85, temperature 98.1 F (36.7 C), temperature source Oral, resp. rate 18, height 5' 8" (1.727 m), weight 87 kg (191 lb 14.4 oz), SpO2 98 %. No results found. Results for orders placed or performed during the hospital encounter of 07/28/17 (from the past 72 hour(s))  Glucose, capillary     Status: Abnormal   Collection Time: 07/28/17  4:23 PM  Result Value Ref Range   Glucose-Capillary 153 (H) 65 - 99 mg/dL  Glucose, capillary     Status: Abnormal   Collection Time: 07/28/17  9:52 PM  Result Value Ref Range   Glucose-Capillary 159 (H) 65 - 99 mg/dL  CBC WITH DIFFERENTIAL     Status: Abnormal   Collection Time: 07/29/17  5:01 AM  Result Value Ref Range   WBC 6.6 4.0 - 10.5 K/uL   RBC 3.07 (L) 3.87 - 5.11 MIL/uL   Hemoglobin 9.0 (L) 12.0 - 15.0 g/dL   HCT 27.4 (L) 36.0 - 46.0 %   MCV 89.3 78.0 - 100.0 fL   MCH 29.3 26.0 - 34.0 pg   MCHC 32.8 30.0 - 36.0 g/dL   RDW 13.2 11.5 - 15.5 %   Platelets 154 150 - 400 K/uL   Neutrophils Relative % 68 %   Neutro Abs 4.5 1.7 - 7.7 K/uL   Lymphocytes Relative 15 %   Lymphs Abs 1.0 0.7 - 4.0 K/uL   Monocytes Relative 12 %   Monocytes Absolute 0.8 0.1 - 1.0 K/uL   Eosinophils Relative 5 %   Eosinophils Absolute 0.3 0.0 - 0.7 K/uL   Basophils Relative 0 %   Basophils Absolute 0.0 0.0 - 0.1 K/uL  Comprehensive metabolic panel     Status: Abnormal   Collection Time: 07/29/17  5:01 AM  Result Value Ref Range   Sodium 138 135 - 145 mmol/L   Potassium 4.0 3.5 - 5.1 mmol/L   Chloride 104 101 - 111 mmol/L   CO2 27 22 - 32 mmol/L   Glucose, Bld 172 (H) 65 - 99 mg/dL   BUN 9 6 - 20 mg/dL   Creatinine, Ser 0.78 0.44 - 1.00 mg/dL   Calcium 8.5 (L) 8.9 - 10.3 mg/dL   Total Protein 5.5 (L) 6.5 - 8.1 g/dL   Albumin 2.9 (L) 3.5 - 5.0 g/dL   AST 21 15 - 41 U/L   ALT 16 14 - 54 U/L   Alkaline Phosphatase 74  38 - 126 U/L   Total Bilirubin 0.8 0.3 - 1.2 mg/dL   GFR calc non Af Amer >60 >60 mL/min   GFR calc Af Amer >60 >60 mL/min    Comment: (NOTE) The eGFR has been calculated using the CKD EPI equation. This calculation has not been validated in all clinical situations. eGFR's persistently <60 mL/min signify possible Chronic Kidney Disease.    Anion gap 7 5 - 15  Glucose, capillary     Status: Abnormal   Collection Time: 07/29/17  5:17 AM  Result Value Ref Range   Glucose-Capillary 167 (H) 65 - 99 mg/dL   Comment 1 Document in Chart      HEENT: normal Cardio: RRR and no murmur Resp: CTA B/L and unlabored GI: BS positive and NT, ND Extremity:  No Edema Skin:   Wound C/D/I and RIght Paraspinal drain healed dressing removed Neuro: Alert/Oriented, Abnormal Sensory Mildly reduced LT Right foot  and ankle, Abnormal Motor 5/5 in BUE, 2- R HF, KE, ADF, 3- Left HF, KE, ADF and Tone:  increased tone BIlateral knee extensor, clonus sustained R ankle , 2 beat o left  Musc/Skel:  Other lumbar incision CDI Gen NAD   Assessment/Plan: 1. 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 Physiatrist is providing close team supervision and 24 hour management of active medical problems listed below. Physiatrist and rehab team continue to assess barriers to discharge/monitor patient progress toward functional and medical goals. FIM:                   Function - Comprehension Comprehension: Auditory Comprehension assist level: Follows complex conversation/direction with no assist  Function - Expression Expression: Verbal  Function - Social Interaction Social Interaction assist level: Interacts appropriately with others with medication or extra time (anti-anxiety, antidepressant).  Function - Problem Solving Problem solving assist level: Solves basic problems with no assist  Function -  Memory Memory assist level: Complete Independence: No helper Patient normally able to recall (first 3 days only): Current season, Staff names and faces, That he or she is in a hospital  Medical Problem List and Plan: 1.Decreased functional mobilitysecondary to lumbar spondylosis with radiculopathy. Status post L5-S1 anterior retroperitoneal exposure with laminectomy and fusion 07/26/2017 as well as history of multiple thoracic lumbar surgeries. Back brace when out of bed CIR evals today 2. DVT Prophylaxis/Anticoagulation: SCDs. Check vascular study 3. Pain Management:Celebrex 200 mg every 12 hours, Neurontin 300 mg 3 times a day, Increase baclofen 15 mg every 8 hours, hydrocodone as needed 4. Mood:Provide emotional support 5. Neuropsych: This patientiscapable of making decisions on herown behalf. 6. Skin/Wound Care:Routine skin checks 7. Fluids/Electrolytes/Nutrition:Routine I&O's with follow-up chemistries 8.Acute blood loss anemia. Follow-up CBC 9.Hypertension. Lisinopril 10 mg daily 10.Diabetes mellitus peripheral neuropathy. Hemoglobin A1c 6.7. Glucophage 500 mg twice a day. Check blood sugars before meals and at bedtime CBG (last 3)  Recent Labs    07/28/17 1623 07/28/17 2152 07/29/17 0517  GLUCAP 153* 159* 167*  controlled 11/15 11.Constipation. Laxative assistance 12.Remote tobacco abuse. Counseling  LOS (Days) 1 A FACE TO FACE EVALUATION WAS PERFORMED  Charlett Blake 07/29/2017, 8:15 AM

## 2017-07-29 NOTE — IPOC Note (Signed)
Overall Plan of Care [facility]) Patient Details Name: Megan [name] MRN: [id number] DOB: [birth year]  Admitting Diagnosis: <principal problem not specified>  Hospital Problems: Active Problems:   Radiculopathy     Functional Problem List: Nursing Bladder, Bowel, Endurance, Pain, Skin Integrity  PT Balance, Pain, Endurance, Motor, Sensory  OT Balance, Endurance, Motor, Sensory  SLP    TR         Basic ADL's: OT Grooming, Bathing, Dressing, Toileting     Advanced  ADL's: OT       Transfers: PT Bed Mobility, Bed to Chair, Car, Manufacturing systems engineer, Metallurgist: PT Stairs, Emergency planning/management officer, Ambulation     Additional Impairments: OT None  SLP        TR      Anticipated Outcomes Item Anticipated Outcome  Self Feeding MOD I  Swallowing      Basic self-care  S UB dressing; MIN A LB dressing  Toileting  MIN A   Bathroom Transfers MIN A  Bowel/Bladder  min assist  Transfers  supervision  Locomotion  min assist ambulatory, mod I w/c  Communication     Cognition     Pain  less than 4  Safety/Judgment  remain free of falls, infection, skin breakdown while on rehab   Therapy Plan: PT Intensity: Minimum of 1-2 x/day ,45 to 90 minutes PT Frequency: 5 out of 7 days PT Duration Estimated Length of Stay: 21-24 days OT Intensity: Minimum of 1-2 x/day, 45 to 90 minutes OT Frequency: 5 out of 7 days OT Duration/Estimated Length of Stay: 21-24      Team Interventions: Nursing Interventions Bowel Management, Bladder Management, Pain Management  PT interventions Ambulation/gait training, Community reintegration, DME/adaptive equipment instruction, Neuromuscular re-education, Psychosocial support, Stair training, UE/LE Strength taining/ROM, Wheelchair propulsion/positioning, UE/LE Coordination activities, Therapeutic Activities, Training and development officer, Discharge planning, Cognitive remediation/compensation, Functional mobility training,  Patient/family education, Splinting/orthotics, Therapeutic Exercise  OT Interventions Training and development officer, Community reintegration, Discharge planning, DME/adaptive equipment instruction, Functional electrical stimulation, Psychosocial support, Therapeutic Exercise, Therapeutic Activities, Functional mobility training, Self Care/advanced ADL retraining, Pain management, Patient/family education, Skin care/wound managment, UE/LE Strength taining/ROM, UE/LE Coordination activities, Wheelchair propulsion/positioning  SLP Interventions    TR Interventions    SW/CM Interventions Discharge Planning, Psychosocial Support, Patient/Family Education   Barriers to Discharge MD  Medical stability and Wound care  Nursing      PT      OT      SLP      SW       Team Discharge Planning: Destination: PT-Home ,OT- Home , SLP-  Projected Follow-up: PT-Home health PT, 24 hour supervision/assistance, OT-  Outpatient OT, SLP-  Projected Equipment Needs: PT-To be determined, OT- None recommended by OT, SLP-  Equipment Details: PT- , OT-potentially TTB if pt willing to purchase Patient/family involved in discharge planning: PT- Patient,  OT-Patient, SLP-   MD ELOS: 16-19d Medical Rehab Prognosis:  Good Assessment:  66 y.o.right handed femalewith history of hypertension,remote tobacco abuse,diabetes mellitus, multiple prior thoracic or lumbar surgeriesand received inpatient rehabilitation services April 2018 after undergoing T9 through T12 decompression and fusion for a left T10-T11 herniated nucleus pulposus with severe stenosis.. Per chart review, husband, and patient,patient lives with spouse. One level home with ramped entrance. Spouse is self-employed and can assist as neededas well as assistance from a neighbor and her daughter. Patient with progressive weakness over the past few months hasrequired scooter for household and community ambulation secondary to bilateral lower extremity  weakness. She needed assistance with meals and bathing.  Last PMR visit August 2018, at that time still had right hip flexor and ankle dorsiflexor weakness.  Was still taking gabapentin for paresthesias, patient was completing outpatient therapy when she noted increasing left lower extremity weakness, she was evaluated by neurosurgery and on 06/05/2017 underwent thoracic and lumbar MRI which showed a new left L5-S1 protrusion compressing S1 nerve root.Admitted 07/26/2017 with progressive back pain and lower extremity weakness. X-rays and imaging revealed lumbosacral spondylosis with radiculopathy. Underwent L5-S1 anterior retroperitoneal exposure L5-S1 laminectomy, L5-S1 posterior lateral fixation with fusion 07/26/2017 per Dr.Ditty  Now requiring 24/7 Rehab RN,MD, as well as CIR level PT, OT .  Treatment team will focus on ADLs and mobility with goals set at min A See Team Conference Notes for weekly updates to the plan of care

## 2017-07-29 NOTE — Progress Notes (Addendum)
Pt A/O, noted little distress related to muscle spasm. She stated that the spasm causes her back to hurt. Repositioning and meds pain regimen will be implemented in her pain of care. Staff will continue to monitor and meet needs.

## 2017-07-29 NOTE — Plan of Care (Signed)
Pure wick in place for incontinence, peri area pinkish red intact, pain controlled with current plan of care

## 2017-07-29 NOTE — Progress Notes (Signed)
Occupational Therapy Session Note  Patient Details  Name: Megan [name] MRN: [id number] Date of Birth: March 02, 1951  Today's Date: 07/29/2017 OT Individual Time: 1000-1027 AND 1415-1433 OT Individual Time Calculation (min): 27 min and 18 min   Short Term Goals: Week 1:  OT Short Term Goal 1 (Week 1): Pt will sit to stand with MOD A of 1 wiht RW to decrease burden of care OT Short Term Goal 2 (Week 1): Pt will stand pivot transfer to [facility] with MAX A 1 caregiver OT Short Term Goal 3 (Week 1): Pt will don footwear with AE PRN OT Short Term Goal 4 (Week 1): Pt will bathe LB wiht MIN A PRN  Skilled Therapeutic Interventions/Progress Updates:  Session One: Pt seen for OT session focusing on education and pain management with ROM. Pt in supine upon arrival, complaints of continual spasms which was resulting in 10/10 pain in back. RN made aware and pain and spasm medication administered. Pt felt that she was unable to complete any OOB activity with pain at it's current state. Pt agreeable to AAROM B LEs and stretching for comfort. Observed increase clonus in R LE with calf stretch.  Pt agreeable to end of session and rest and plans for this therapist to return in PM as able to cont with remainder of session. Pt very motivated to participate, however, did not feel she was able or it was safe at this time with current pain level and spasms.   Session Two: Therapist returned to make up time from AM session, pt finishing PT eval. She voiced increased fatigue following PT eval and desiring to return to bed.  She self propelled w/c back to room with supervision for UE strengthening/endurance. She completed sliding board transfer w/c> EOB with assist for set-up/ placement of board and supervision for transfer. Mod A required for return to supine for management of B LEs. Pt left in supine at end of session, all needs in reach. Obtained bariatric drop arm BSC for pt. Demonstration and education provided  regarding technique for sliding board transfer to [facility] and clothing management using lateral leans as option for toileting task when with nursing, other option to use STEDY. Pt voiced understanding and demonstrates ability to properly direct caregivers with mobility and assist.  Therapy Documentation Precautions:  Precautions Precautions: Back, Fall Precaution Booklet Issued: Yes (comment) Precaution Comments: pt able to 3/3 back precautions Required Braces or Orthoses: Spinal Brace, Other Brace/Splint Spinal Brace: Lumbar corset, Applied in sitting position Restrictions Weight Bearing Restrictions: No Pain: Pain Assessment Pain Assessment: 0-10 Pain Score: 10 Pain Location: Back Pain Orientation: Mid;Lower Pain Onset: On-going Patients Stated Pain Goal: 8 Pain Intervention(s): RN aware, medication administered (See eMAR), rest  See Function Navigator for Current Functional Status.   Therapy/Group: Individual Therapy  Lewis, Cherri Yera C 07/29/2017, 12:41 PM

## 2017-07-29 NOTE — Progress Notes (Signed)
Social Work Assessment and Plan Social Work Assessment and Plan  Patient Details  Name: Megan [name] MRN: [id number] Date of Birth: Nov 03, 1950  Today's Date: 07/29/2017  Problem List:  Patient Active Problem List   Diagnosis Date Noted  . Radiculopathy 07/28/2017  . Fusion of spine, lumbosacral region   . Acute blood loss anemia   . Benign essential HTN   . Diabetes mellitus type 2 in obese ([place])   . Lumbosacral spondylosis with radiculopathy 07/26/2017  . Sensation of pressure in bladder area 01/31/2017  . Paraplegia, incomplete ([place]) 01/07/2017  . Neurogenic bladder 12/27/2016  . 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/27/2016  . Myelopathy ([place]) 12/21/2016  . Surgery, elective   . Diabetes mellitus type 2 in nonobese (HCC)   . History of lumbar fusion   . History of fusion of cervical spine   . Neuropathic pain   . Post-operative pain   . 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 Paraparesis ([place]) 12/15/2016  . Pedal edema 12/14/2016  . Type 2 diabetes mellitus without complication, without long-term current use of insulin ([place]) 11/02/2016  . Hyperlipidemia 11/02/2016  . Essential hypertension 11/02/2016  . 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   Past Medical History:  Past Medical History:  Diagnosis Date  . Arthritis    "hands, back" (07/26/2017)  . Dyspnea    W/ PHYS CONDITION   . GERD (gastroesophageal reflux disease)   . History of bronchitis    "not since I quit smoking" (07/26/2017)  . History of kidney stones   . History of shingles   . Hyperlipidemia    takes Fish Oil daily  . Hypertension   . Neuromuscular disorder (HCC)    tingling toes  . 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[place]) 12/15/2016  . Pneumonia 2009  . PONV (postoperative nausea and vomiting)   . Restless leg   . Type 2 diabetes mellitus ([place])   . Uterine cancer ([place]) 1979   S/P hysterectomy  . Weakness    numbness  and tingling in both feet r/t back   Past Surgical History:  Past Surgical History:  Procedure Laterality Date  . ANTERIOR CERVICAL DECOMP/DISCECTOMY FUSION  2001  . APPLICATION OF ROBOTIC ASSISTANCE FOR SPINAL PROCEDURE  12/19/2016   Performed by Ditty, Kevan Ny, MD at [place]  . BACK SURGERY    . BLADDER SUSPENSION  1991   tack  . INGUINAL HERNIA REPAIR Right 1991  . LEFT THORACIC TEN  COSTO TRANSVERSECTOMY , LEFT THORACIC TEN-ELEVEN DISCECTOMY , THORACIC NINE -TWELVE  POSTERIOR FIXATION AND FUSION Left 12/19/2016   Performed by Ditty, Kevan Ny, MD at [place]  2016; 2017; 07/26/2017   L4-5; L2-3; L5-S1  . Lumbar two-three  Lumbar three-four Posterior lumbar interbody fusion with augmentation N/A 04/27/2016   Performed by Leeroy Cha, MD at [facility] NEURO ORS  . POSTERIOR LUMBAR INTERBODY FUSION ,DECOMPRESSION LUMBAR FOUR,LUMBAR FIVEL N/A 06/10/2015   Performed by Leeroy Cha, MD at [facility] NEURO ORS  . TONSILLECTOMY AND ADENOIDECTOMY  1959  . TUMOR EXCISION     WERTHIN'S TUMORS BOTH SIDES OF NECK  . VAGINAL HYSTERECTOMY  1979   Social History:  reports that she quit smoking about 11 years ago. Her smoking use included cigarettes. She has a 55.50 pack-year smoking history. she has never used smokeless tobacco. She reports that she drinks alcohol. She reports that she does not use drugs.  Family / Support  Systems Marital Status: Married How Long?: 35 years Patient Roles: Spouse, Parent, Other (Comment)(sister; grandparent; friend; neighbor) Spouse/Significant Other: Britiney Blahnik - husband - [phone] (h); [phone] (m) Children: Hughes Better - dtr - [phone] (h)*; [phone] (m) Other Supports: son-in-law; brother; neighbor; friend; 77 y/o granddtr Anticipated Caregiver: spouse, dtr, neighbor, newly retired friend Ability/Limitations of Caregiver: Spouse works but owns his company with flexible schedule.  He is a self Administrator, Civil Service and can  arrange his hours to coordinate with dtr and neighbor and friend availability to care ofr pt. Caregiver Availability: 24/7 Family Dynamics: close, supportive family/friends  Social History Preferred language: English Religion: Baptist Read: Yes Write: Yes Employment Status: Retired Date Retired/Disabled/Unemployed: June 2017 Age Retired: 65 Freight forwarder Issues: none reported Guardian/Conservator: N/A - Dr. Letta Pate has determined that pt is capable of making her own decisions.   Abuse/Neglect Abuse/Neglect Assessment Can Be Completed: Yes Physical Abuse: Denies Verbal Abuse: Denies Sexual Abuse: Denies Exploitation of patient/patient's resources: Denies Self-Neglect: Denies  Emotional Status Pt's affect, behavior adn adjustment status: Pt remains upbeat and positive as she was with previous admission back in April 2018.  She is motivated to get better and get home.   Recent Psychosocial Issues: Multiple surgeries, losing function, not being able to do household chores/cook/laundry, etc Pyschiatric History: none reported Substance Abuse History: none reported  Patient / Family Perceptions, Expectations & Goals Pt/Family understanding of illness & functional limitations: Pt/family report a good understanding of pt's condition and limitations.  Pt has good support and feels the doctors have answered all of her question.   Premorbid pt/family roles/activities: Pt enjoys reading, working [employer], and spending time with her granddtr and other family members. Anticipated changes in roles/activities/participation: Pt wants to resume the above activities as she is able and continues to want to do more around the house, like she did prior to back troubles.  She wants to take some of the load off of her husband, although he seems to want to do chores to protect pt.  Dtr is helping with out with housecleaning while she is in between jobs. Pt/family expectations/goals:  Pt wants to regain as much independence as she can and get to where she can do some things around the house.  She also wants to be able to walk with the walker.  Community [employer] Agencies: None Premorbid Home Care/DME Agencies: Other (Comment)([facility]); [place])  Pt has a cane, rolling walker, rollator, shower seat, bedside commode, electric scooter, and ramped entrance to home. Transportation available at discharge: family  Discharge Planning Living Arrangements: Spouse/significant other Support Systems: Spouse/significant other, Children, Other relatives, Friends/neighbors Type of Residence: Private residence Insurance Resources: Multimedia programmer (specify)([employer] [place]) Museum/gallery curator Resources: Fish farm manager, Family Support Financial Screen Referred: No Money Management: Spouse Does the patient have any problems obtaining your medications?: No Home Management: Husband and dtr are taking care of this right now.  Pt tries to dust and wash some dishes when she is able. Patient/Family Preliminary Plans: Pt to return to her home with her husband who will take some time off to be with her.  Dtr, neighbor, and a newly retired friend will be able to assist pt at home. Social Work Anticipated Follow Up Needs: HH/OP Expected length of stay: ELOS 17-20 days  Clinical Impression CSW met with pt who is familiar to this CSW and to CIR from April 2018 admission.  CSW explained that CSW will  be following pt again and she was pleased.  Pt reported having a good experience last admission, with home health agency, and with outpt therapy.  Pt now has her own electric scooter now, as last admission she was borrowing one.  Pt with great support still from her husband, dtr, neighbor, and now newly retired friend.  She feels comfortable going home after CIR with this support.  She has a lot of DME and ramped entrance to home.  Pt with no current  concerns/questions/needs.  Dtr, Magda Paganini, came in during end of CSW visit and asked if family would be invited to come in for family education closer to d/c and CSW told her yes.  CSW will continue to follow and assist pt as needed.  Mandrell Vangilder, Silvestre Mesi 07/29/2017, 12:46 PM

## 2017-07-30 DIAGNOSIS — D62 Acute posthemorrhagic anemia: Secondary | ICD-10-CM

## 2017-07-30 DIAGNOSIS — I10 Essential (primary) hypertension: Secondary | ICD-10-CM

## 2017-07-30 DIAGNOSIS — E8809 Other disorders of plasma-protein metabolism, not elsewhere classified: Secondary | ICD-10-CM

## 2017-07-30 DIAGNOSIS — E46 Unspecified protein-calorie malnutrition: Secondary | ICD-10-CM

## 2017-07-30 DIAGNOSIS — E1142 Type 2 diabetes mellitus with diabetic polyneuropathy: Secondary | ICD-10-CM

## 2017-07-30 DIAGNOSIS — M62838 Other muscle spasm: Secondary | ICD-10-CM

## 2017-07-30 LAB — GLUCOSE, CAPILLARY
GLUCOSE-CAPILLARY: 165 mg/dL — AB (ref 65–99)
Glucose-Capillary: 138 mg/dL — ABNORMAL HIGH (ref 65–99)
Glucose-Capillary: 158 mg/dL — ABNORMAL HIGH (ref 65–99)
Glucose-Capillary: 161 mg/dL — ABNORMAL HIGH (ref 65–99)

## 2017-07-30 MED ORDER — METHOCARBAMOL 750 MG PO TABS
750.0000 mg | ORAL_TABLET | Freq: Four times a day (QID) | ORAL | Status: DC
  Administered 2017-07-30 (×2): 750 mg via ORAL
  Filled 2017-07-30 (×4): qty 1

## 2017-07-30 MED ORDER — BACLOFEN 20 MG PO TABS
20.0000 mg | ORAL_TABLET | Freq: Four times a day (QID) | ORAL | Status: DC
  Administered 2017-07-30: 20 mg via ORAL
  Filled 2017-07-30: qty 1

## 2017-07-30 MED ORDER — PRO-STAT SUGAR FREE PO LIQD
30.0000 mL | Freq: Two times a day (BID) | ORAL | Status: DC
  Administered 2017-07-30 – 2017-08-03 (×4): 30 mL via ORAL
  Filled 2017-07-30 (×28): qty 30

## 2017-07-30 NOTE — Progress Notes (Signed)
LBM 07/25/17, refusing bowel regimen

## 2017-07-30 NOTE — Progress Notes (Signed)
Subjective/Complaints: Lying in bed this morning. She states she slept well overnight, but woke up this morning with spasms. She states that they are controlled with medications. She had a good first in therapies yesterday.  ROS: +Lower extremity spasms. Denies CP, SOB, N/V/D  Objective: Vital Signs: Blood pressure (!) 155/69, pulse 88, temperature 98.2 F (36.8 C), temperature source Oral, resp. rate 18, height 5' 8"  (1.727 m), weight 87 kg (191 lb 14.4 oz), SpO2 96 %. No results found. Results for orders placed or performed during the hospital encounter of 07/28/17 (from the past 72 hour(s))  Glucose, capillary     Status: Abnormal   Collection Time: 07/28/17  4:23 PM  Result Value Ref Range   Glucose-Capillary 153 (H) 65 - 99 mg/dL  Glucose, capillary     Status: Abnormal   Collection Time: 07/28/17  9:52 PM  Result Value Ref Range   Glucose-Capillary 159 (H) 65 - 99 mg/dL  CBC WITH DIFFERENTIAL     Status: Abnormal   Collection Time: 07/29/17  5:01 AM  Result Value Ref Range   WBC 6.6 4.0 - 10.5 K/uL   RBC 3.07 (L) 3.87 - 5.11 MIL/uL   Hemoglobin 9.0 (L) 12.0 - 15.0 g/dL   HCT 27.4 (L) 36.0 - 46.0 %   MCV 89.3 78.0 - 100.0 fL   MCH 29.3 26.0 - 34.0 pg   MCHC 32.8 30.0 - 36.0 g/dL   RDW 13.2 11.5 - 15.5 %   Platelets 154 150 - 400 K/uL   Neutrophils Relative % 68 %   Neutro Abs 4.5 1.7 - 7.7 K/uL   Lymphocytes Relative 15 %   Lymphs Abs 1.0 0.7 - 4.0 K/uL   Monocytes Relative 12 %   Monocytes Absolute 0.8 0.1 - 1.0 K/uL   Eosinophils Relative 5 %   Eosinophils Absolute 0.3 0.0 - 0.7 K/uL   Basophils Relative 0 %   Basophils Absolute 0.0 0.0 - 0.1 K/uL  Comprehensive metabolic panel     Status: Abnormal   Collection Time: 07/29/17  5:01 AM  Result Value Ref Range   Sodium 138 135 - 145 mmol/L   Potassium 4.0 3.5 - 5.1 mmol/L   Chloride 104 101 - 111 mmol/L   CO2 27 22 - 32 mmol/L   Glucose, Bld 172 (H) 65 - 99 mg/dL   BUN 9 6 - 20 mg/dL   Creatinine, Ser 0.78  0.44 - 1.00 mg/dL   Calcium 8.5 (L) 8.9 - 10.3 mg/dL   Total Protein 5.5 (L) 6.5 - 8.1 g/dL   Albumin 2.9 (L) 3.5 - 5.0 g/dL   AST 21 15 - 41 U/L   ALT 16 14 - 54 U/L   Alkaline Phosphatase 74 38 - 126 U/L   Total Bilirubin 0.8 0.3 - 1.2 mg/dL   GFR calc non Af Amer >60 >60 mL/min   GFR calc Af Amer >60 >60 mL/min    Comment: (NOTE) The eGFR has been calculated using the CKD EPI equation. This calculation has not been validated in all clinical situations. eGFR's persistently <60 mL/min signify possible Chronic Kidney Disease.    Anion gap 7 5 - 15  Glucose, capillary     Status: Abnormal   Collection Time: 07/29/17  5:17 AM  Result Value Ref Range   Glucose-Capillary 167 (H) 65 - 99 mg/dL   Comment 1 Document in Chart   Glucose, capillary     Status: Abnormal   Collection Time: 07/29/17 11:52 AM  Result  Value Ref Range   Glucose-Capillary 162 (H) 65 - 99 mg/dL  Glucose, capillary     Status: Abnormal   Collection Time: 07/29/17  4:45 PM  Result Value Ref Range   Glucose-Capillary 136 (H) 65 - 99 mg/dL  Glucose, capillary     Status: Abnormal   Collection Time: 07/29/17  9:49 PM  Result Value Ref Range   Glucose-Capillary 170 (H) 65 - 99 mg/dL  Glucose, capillary     Status: Abnormal   Collection Time: 07/30/17  6:01 AM  Result Value Ref Range   Glucose-Capillary 138 (H) 65 - 99 mg/dL     HEENT: Normocephalic. Atraumatic. Cardio: RRR and no JVD Resp: CTA B/L and unlabored GI: BS positive and ND Skin:   Intact. Warm and dry. Neuro: Alert/Oriented Motor 5/5 in BUE RLE: 3-/5 HF, KE, ADF (partly limited by pain inhibition) 4-/5 Left HF, KE, ADF (partly limited by pain inhibition) Musc/Skel:  No edema. No tenderness. Gen NAD. Vital signs reviewed.    Assessment/Plan: 1. 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 Physiatrist is providing close team supervision and  24 hour management of active medical problems listed below. Physiatrist and rehab team continue to assess barriers to discharge/monitor patient progress toward functional and medical goals. FIM: Function - Bathing Position: Sitting EOB Body parts bathed by patient: Right arm, Left arm, Chest, Abdomen, Front perineal area, Right upper leg, Left upper leg Body parts bathed by helper: Buttocks, Right lower leg, Left lower leg, Back Assist Level: Touching or steadying assistance(Pt > 75%)  Function- Upper Body Dressing/Undressing What is the patient wearing?: Bra, Button up shirt, Orthosis Bra - Perfomed by patient: Thread/unthread right bra strap, Thread/unthread left bra strap Bra - Perfomed by helper: Hook/unhook bra (pull down sports bra) Button up shirt - Perfomed by patient: Thread/unthread right sleeve, Pull shirt around back, Button/unbutton shirt, Thread/unthread left sleeve Orthosis activity level: Performed by patient Assist Level: Touching or steadying assistance(Pt > 75%) Function - Lower Body Dressing/Undressing What is the patient wearing?: Socks, Pants Pants- Performed by patient: Thread/unthread right pants leg, Thread/unthread left pants leg Pants- Performed by helper: Pull pants up/down Socks - Performed by helper: Don/doff right sock, Don/doff left sock Assist for footwear: Dependant Assist for lower body dressing: Touching or steadying assistance (Pt > 75%)  Function - Toileting Toileting activity did not occur: No continent bowel/bladder event     Function - Chair/bed transfer Chair/bed transfer method: Lateral scoot, Stand pivot Chair/bed transfer assist level: dependent (Pt equals 0%)(supervision for slide board) Chair/bed transfer assistive device: Sliding board, Armrests, Mechanical lift Mechanical lift: Stedy Chair/bed transfer details: Verbal cues for precautions/safety, Verbal cues for sequencing, Verbal cues for safe use of DME/AE, Verbal cues for  technique, Manual facilitation for weight shifting  Function - Locomotion: Wheelchair Will patient use wheelchair at discharge?: Yes Type: Manual Max wheelchair distance: 200 Assist Level: Supervision or verbal cues Assist Level: Supervision or verbal cues Assist Level: Supervision or verbal cues Function - Locomotion: Ambulation Ambulation activity did not occur: Safety/medical concerns Walk 10 feet on uneven surfaces activity did not occur: Safety/medical concerns  Function - Comprehension Comprehension: Auditory Comprehension assist level: Follows complex conversation/direction with no assist  Function - Expression Expression: Verbal Expression assist level: Expresses complex ideas: With no assist  Function - Social Interaction Social Interaction assist level: Interacts appropriately with others with medication or extra time (anti-anxiety, antidepressant).  Function - Problem  Solving Problem solving assist level: Solves basic problems with no assist  Function - Memory Memory assist level: Complete Independence: No helper Patient normally able to recall (first 3 days only): Current season, Location of own room, Staff names and faces, That he or she is in a hospital  Medical Problem List and Plan: 1.Decreased functional mobilitysecondary to lumbar spondylosis with radiculopathy. Status post L5-S1 anterior retroperitoneal exposure with laminectomy and fusion 07/26/2017 as well as history of multiple thoracic lumbar surgeries. Back brace when out of bed   Cont CIR  2. DVT Prophylaxis/Anticoagulation: SCDs.    Vascular study negative for DVT.  3. Pain Management:Celebrex 200 mg every 12 hours, Neurontin 300 mg 3 times a day, hydrocodone as needed  Baclofen increased to 20 mg 3 times a day on 11/17  4. Mood:Provide emotional support 5. Neuropsych: This patientiscapable of making decisions on herown behalf. 6. Skin/Wound Care:Routine skin checks 7.  Fluids/Electrolytes/Nutrition:Routine I&O's 8.Acute blood loss anemia.   Hb 9.0 on 11/16  Continue to monitor 9.Hypertension. Lisinopril 10 mg daily   Will consider increasing tomorrow if persistently elevated 10.Diabetes mellitus peripheral neuropathy. Hemoglobin A1c 6.7. Glucophage 500 mg twice a day. Check blood sugars before meals and at bedtime CBG (last 3)  Recent Labs    07/29/17 1645 07/29/17 2149 07/30/17 0601  GLUCAP 136* 170* 138*    Relatively controlled 11/17 11.Constipation. Laxative assistance 12.Remote tobacco abuse. Counseling 13. Hypoalbuminemia  Supplement initiated on 11/17  LOS (Days) 2 A FACE TO FACE EVALUATION WAS PERFORMED  Arin Vanosdol Lorie Phenix 07/30/2017, 8:14 AM

## 2017-07-30 NOTE — Op Note (Signed)
07/26/2017  9:55 AM  PATIENT:  Megan [name]  66 y.o. female  PRE-OPERATIVE DIAGNOSIS:  Lumbosacral spondylosis with radiculopathy; herniated nucleus pulposis L5-S1 left; prior multilevel lumbar fusion  POST-OPERATIVE DIAGNOSIS:  Same  PROCEDURE:  L5-S1 anterior lumbar interbody fusion with plate fixation; use of BMP; L5-S1 laminectomy for decompression; exploration of fusion L2-3, L3-4, L4-5; removal of pedicle screws L2 and L3; placement of pedicle screws L4 bilaterally; L5-S1 non-segmental instrumented fusion; pelvic fixation using S2AI screws  SURGEON:  Aldean Ast, MD  CO-SURGEON (for ALIF only): Deitra Mayo, MD  ASSISTANTS: Ashok Pall, MD  ANESTHESIA:   General  DRAINS: Medium hemovac   SPECIMEN:  None  INDICATION FOR PROCEDURE: 66 year old woman with multiple prior thoracic and lumbar operations who presents with lumbar radiculopathy due to an L5-S1 disc herniation.  I recommended the above operation. Patient understood the risks, benefits, and alternatives and potential outcomes and wished to proceed.  PROCEDURE DETAILS: After smooth induction of general endotracheal anesthesia the patient was positioned supine.  The skin was prepped and draped in the usual sterile fashion.  Dr. Scot Dock provided anterior exposure of L5-S1.  Please see his note for the details of this procedure.    The annulus of L5-S1 was incised.  Discectomy was performed with pituitary rongeurs.  The cartilaginous endplates were removed with an endplate rake and curettes.  The endplates were prepared for fusion.  A trial spacer was inserted and position was confirmed with fluoroscopy.  A PEEK cage was loaded with BMP and alphagraft and inserted into the interspace at L5-S1.  A plate was applied and fixated using two screws in L5 and two in S1.  I irrigated with bacitracin saline.  The retractors were carefully removed.  The wound was closed in routine anatomic layers using a running PDS  suture in the fascia, interrupted vicryls in the deep dermal layer, and a running monocryl stratafix suture in the skin.  A sterile dressing was applied.  The patient was positioned prone on an open [place] table.  The skin of the low back was wiped down with alcohol.  The area of the planned incision was infiltrated with lidocaine and marcaine with epinephrine.  The patient was prepped and draped in the usual sterile fashion.  The existing lumbar scar was excised and the incision was extended inferiorly.  The soft tissues were opened in the midline with monopolar cautery.  Subperiosteal dissection was performed at L5-S1 to expose the L5 transverse processes and the sacral ala.  I also dissected and over the surface of the sacrum to the entry points for the S2AI screws.  Dissection was carried out superiorly in a lateral direction to expose the hardware from L2-5 bilaterally.  There was bony overgrowth over the rod and screws at multiple points.  The crosslink was identified.  The set screws were removed.  The rods and crosslink were then easily removed together.  The [facility] robotic system was attached to the patient utilizing a left PSIS pin.  This was then registered to the patient using fluoroscopy.  The system was used to drill holes for the bilateral L4 and S1 pedicle screws and the S2AI screws.  K-Wires were placed.  I tapped over each K-wire and then palpated the tracts to assess competency.  I was satisfied that these were competent.  I explored the fusion at L2-3, L3-4, and L4-5 and identified bridging bone posterolaterally.  I was unable to cause any abnormal motion by applying force  to the adjacent pedicle screws.  I was satisfied that there was adequate bony fusion at these levels.  I removed the bilateral L2, L3, and L5 screws.  I palpated the L5 screw holes and found them to be competent.  I placed pedicle screws bilaterally at L4, L5, and S1 and also inserted the S2AI screws bilaterally.  I  resected the spinous process of L5.  I thinned the lamina with a high speed burr.  I then resected the lamina to the level of the L5 pedicle using [place] rongeurs.  Ligamentum flavum was elevated and resected.  I identified the L5 nerve roots bilaterally and decompressed the along their course.  I performed a left L5-S1 foraminotomy.  I resected the superior edge of the sacrum to decompress the S1 nerve roots bilaterally.  I inserted a lordotic rod into the pedicle screws on each side and secured them with set screws.  These were then finally tightened with a torque device.  I irrigated vigorously with bacitracin saline.  There was excellent hemostasis.  I decorticated the remaining posterior elements of L5 and the sacral ala bilaterally.  I placed autograft, DBM putty, and BMP over the decorticated surfaces.   A hemovac drain was placed below the fascia and tunneled to exit the skin remotely.  It was secured with a suture.  Vancomycin powder was placed in the wound.  Exparel was injected in the paraspinous muscles.  The wound was closed in routine anatomic layers using interrupted vicryl sutures.  The incision was closed with a running stratafix monocryl.  The skin was sealed with dermabond.  A sterile dressing was applied.  She awoke without difficulty.  PATIENT DISPOSITION:  PACU - hemodynamically stable.   Delay start of Pharmacological VTE agent (>24hrs) due to surgical blood loss or risk of bleeding:  yes

## 2017-07-30 NOTE — Plan of Care (Signed)
Pt has had no BM since 12th refused sorbitol will offer dulcolax tablets in morning Baclofen increased yesterday for uncontrolled spasms and back pain Pt states pain is improving when medication is taken.

## 2017-07-31 ENCOUNTER — Inpatient Hospital Stay (HOSPITAL_COMMUNITY): Payer: BLUE CROSS/BLUE SHIELD | Admitting: Physical Therapy

## 2017-07-31 ENCOUNTER — Inpatient Hospital Stay (HOSPITAL_COMMUNITY): Payer: BLUE CROSS/BLUE SHIELD

## 2017-07-31 DIAGNOSIS — E1149 Type 2 diabetes mellitus with other diabetic neurological complication: Secondary | ICD-10-CM

## 2017-07-31 DIAGNOSIS — K5903 Drug induced constipation: Secondary | ICD-10-CM

## 2017-07-31 LAB — GLUCOSE, CAPILLARY
GLUCOSE-CAPILLARY: 141 mg/dL — AB (ref 65–99)
Glucose-Capillary: 150 mg/dL — ABNORMAL HIGH (ref 65–99)
Glucose-Capillary: 143 mg/dL — ABNORMAL HIGH (ref 65–99)
Glucose-Capillary: 147 mg/dL — ABNORMAL HIGH (ref 65–99)

## 2017-07-31 MED ORDER — METHOCARBAMOL 750 MG PO TABS
750.0000 mg | ORAL_TABLET | Freq: Once | ORAL | Status: AC
  Administered 2017-07-31: 750 mg via ORAL

## 2017-07-31 MED ORDER — METHOCARBAMOL 500 MG PO TABS
1000.0000 mg | ORAL_TABLET | Freq: Four times a day (QID) | ORAL | Status: DC
  Administered 2017-07-31 (×4): 1000 mg via ORAL
  Filled 2017-07-31 (×5): qty 2

## 2017-07-31 MED ORDER — POLYETHYLENE GLYCOL 3350 17 G PO PACK
17.0000 g | PACK | Freq: Two times a day (BID) | ORAL | Status: DC
  Administered 2017-07-31 – 2017-08-14 (×16): 17 g via ORAL
  Filled 2017-07-31 (×27): qty 1

## 2017-07-31 MED ORDER — TIZANIDINE HCL 2 MG PO TABS
4.0000 mg | ORAL_TABLET | Freq: Every day | ORAL | Status: DC
  Administered 2017-07-31: 4 mg via ORAL
  Filled 2017-07-31: qty 2

## 2017-07-31 MED ORDER — SENNOSIDES-DOCUSATE SODIUM 8.6-50 MG PO TABS
2.0000 | ORAL_TABLET | Freq: Every day | ORAL | Status: DC
  Administered 2017-07-31 – 2017-08-02 (×3): 2 via ORAL
  Filled 2017-07-31 (×3): qty 2

## 2017-07-31 MED ORDER — BISACODYL 10 MG RE SUPP
10.0000 mg | Freq: Every day | RECTAL | Status: DC | PRN
  Administered 2017-08-01: 10 mg via RECTAL
  Filled 2017-07-31: qty 1

## 2017-07-31 NOTE — Plan of Care (Signed)
  Progressing RH SKIN INTEGRITY RH STG SKIN FREE OF INFECTION/BREAKDOWN Description Patient will remain free of skin breakdown and infection while on rehab with min assist  07/31/2017 0225 - Progressing by Gwendolyn Grant, RN RH STG MAINTAIN SKIN INTEGRITY WITH ASSISTANCE Description STG Maintain Skin Integrity With Assistance. Min  07/31/2017 0225 - Progressing by Gwendolyn Grant, RN RH PAIN MANAGEMENT RH STG PAIN MANAGED AT OR BELOW PT'S PAIN GOAL Description Less than 4  07/31/2017 0225 - Progressing by Gwendolyn Grant, RN

## 2017-07-31 NOTE — Progress Notes (Signed)
Physical Therapy Session Note  Patient Details  Name: Megan [name] MRN: [id number] Date of Birth: [birth year]  Today's Date: 07/31/2017 PT Individual Time: 1100-1155 and 1330-1400  PT Individual Time Calculation (min): 55 min and 30 min (total 85 min)  Short Term Goals: Week 1:  PT Short Term Goal 1 (Week 1): Pt will tranfser with +1 assist without lift equipment PT Short Term Goal 2 (Week 1): Pt will initiate gait training with LRAD PT Short Term Goal 3 (Week 1): Pt will tolerate 1 minute standing with BUE support x2 minutes  Skilled Therapeutic Interventions/Progress Updates: Tx 1: Pt received supine in bed, c/o pain as below and declining OOB activity at this time d/t orthostasis, nausea earlier when attempting to get up with OT. Pt instructed in LE strengthening exercises 2x10-15 reps each including heel slides (AAROM RLE), short arc quads, glute sets, abdominal draw in. Provided pt with pictures and written instructions for all exercises, and educated on performance outside of regular therapy sessions and on days with less/no therapy such as upcoming thanksgiving holiday. Discussed position changes with pt and husband; husband demonstrates ability to provide minA to assist pt with rolling, and recommended sidelying for occasional change in position to relieve pressure on bony prominences and reduce risk of skin breakdown. Educated pt on use of hospital bed in chair position to increase tolerance to upright and normalize BP regulation. Pt demonstrates appropriate use of incentive spirometer x8 reps; encouraged use of device especially when pt spending large quantity of the day in bed. Remained seated in bed at end of session, all needs in reach.   Tx 2: Pt received supine in bed, c/o pain as below and agreeable to treatment. Supine BP 146/67 HR 82bpm. Supine>sit with HOB elevated and bedrails, modA with logroll technique. Seated on EOB BP assessed 153/68 HR 87 bpm. Pt performed BLE ankle pumps  in sitting to assist with venous return to reduce risk/sensation of orthostasis. With with sustained clonus RLE during activity; therapist provided heavy input to knee to stop clonus. Pt with increased back pain attempting to perform BLE long arc quads. Pt reporting back pain increasing too much and she would like to lie down, declines additional time spent in sitting or performance of any bed level activities. Attempted scooting to R towards HOB to aide in positioning when returning to supine; performed one small scoot with S, second scoot therapist attempted to assist by pulling with chuck pad and pt reports increased back pain. Pt states she would rather pull up in bed once lying down. Sit >supine modA for LE management. Scooting to [place] with bedrails and BLE in bridging position. Remained seated in bed, all needs in reach.      Therapy Documentation Precautions:  Precautions Precautions: Back, Fall Precaution Booklet Issued: Yes (comment) Precaution Comments: pt able to 3/3 back precautions Required Braces or Orthoses: Spinal Brace, Other Brace/Splint Spinal Brace: Lumbar corset, Applied in sitting position Other Brace/Splint: AFO RLE Restrictions Weight Bearing Restrictions: No General: PT Amount of Missed Time (min): 30 Minutes PT Missed Treatment Reason: Pain Pain: Pain Assessment Pain Score: 8  Pain Type: Acute pain Pain Location: Leg Pain Orientation: Left;Right Pain Descriptors / Indicators: Aching;Discomfort;Spasm Pain Intervention(s): Medication (See eMAR)  See Function Navigator for Current Functional Status.   Therapy/Group: Individual Therapy  Luberta Mutter 07/31/2017, 11:48 AM

## 2017-07-31 NOTE — Progress Notes (Signed)
Occupational Therapy Note  Patient Details  Name: Megan [name] MRN: [id number] Date of Birth: 10/11/1950  Attempted to pick up missed time from earlier in the day (35 min) of skilled OT services, however pt declined requesting to rest so she could "do better tomorrow"   Tonny Branch 07/31/2017, 4:21 PM

## 2017-07-31 NOTE — Progress Notes (Addendum)
Subjective/Complaints: Pt seen laying in bed this AM.  She states she did not sleep well overnight due spasms.  Received a call overnight requesting additional dose of Robaxin. This AM she has nausea, which she believes is secondary to not having a BM, for which she recently received meds.   ROS: +Lower extremity spasms, nausea. Denies CP, SOB, diarrhea.  Objective: Vital Signs: Blood pressure (!) 160/75, pulse 83, temperature 98 F (36.7 C), temperature source Oral, resp. rate 18, height 5' 8"  (1.727 m), weight 87 kg (191 lb 14.4 oz), SpO2 98 %. No results found. Results for orders placed or performed during the hospital encounter of 07/28/17 (from the past 72 hour(s))  Glucose, capillary     Status: Abnormal   Collection Time: 07/28/17  4:23 PM  Result Value Ref Range   Glucose-Capillary 153 (H) 65 - 99 mg/dL  Glucose, capillary     Status: Abnormal   Collection Time: 07/28/17  9:52 PM  Result Value Ref Range   Glucose-Capillary 159 (H) 65 - 99 mg/dL  CBC WITH DIFFERENTIAL     Status: Abnormal   Collection Time: 07/29/17  5:01 AM  Result Value Ref Range   WBC 6.6 4.0 - 10.5 K/uL   RBC 3.07 (L) 3.87 - 5.11 MIL/uL   Hemoglobin 9.0 (L) 12.0 - 15.0 g/dL   HCT 27.4 (L) 36.0 - 46.0 %   MCV 89.3 78.0 - 100.0 fL   MCH 29.3 26.0 - 34.0 pg   MCHC 32.8 30.0 - 36.0 g/dL   RDW 13.2 11.5 - 15.5 %   Platelets 154 150 - 400 K/uL   Neutrophils Relative % 68 %   Neutro Abs 4.5 1.7 - 7.7 K/uL   Lymphocytes Relative 15 %   Lymphs Abs 1.0 0.7 - 4.0 K/uL   Monocytes Relative 12 %   Monocytes Absolute 0.8 0.1 - 1.0 K/uL   Eosinophils Relative 5 %   Eosinophils Absolute 0.3 0.0 - 0.7 K/uL   Basophils Relative 0 %   Basophils Absolute 0.0 0.0 - 0.1 K/uL  Comprehensive metabolic panel     Status: Abnormal   Collection Time: 07/29/17  5:01 AM  Result Value Ref Range   Sodium 138 135 - 145 mmol/L   Potassium 4.0 3.5 - 5.1 mmol/L   Chloride 104 101 - 111 mmol/L   CO2 27 22 - 32 mmol/L    Glucose, Bld 172 (H) 65 - 99 mg/dL   BUN 9 6 - 20 mg/dL   Creatinine, Ser 0.78 0.44 - 1.00 mg/dL   Calcium 8.5 (L) 8.9 - 10.3 mg/dL   Total Protein 5.5 (L) 6.5 - 8.1 g/dL   Albumin 2.9 (L) 3.5 - 5.0 g/dL   AST 21 15 - 41 U/L   ALT 16 14 - 54 U/L   Alkaline Phosphatase 74 38 - 126 U/L   Total Bilirubin 0.8 0.3 - 1.2 mg/dL   GFR calc non Af Amer >60 >60 mL/min   GFR calc Af Amer >60 >60 mL/min    Comment: (NOTE) The eGFR has been calculated using the CKD EPI equation. This calculation has not been validated in all clinical situations. eGFR's persistently <60 mL/min signify possible Chronic Kidney Disease.    Anion gap 7 5 - 15  Glucose, capillary     Status: Abnormal   Collection Time: 07/29/17  5:17 AM  Result Value Ref Range   Glucose-Capillary 167 (H) 65 - 99 mg/dL   Comment 1 Document in Chart  Glucose, capillary     Status: Abnormal   Collection Time: 07/29/17 11:52 AM  Result Value Ref Range   Glucose-Capillary 162 (H) 65 - 99 mg/dL  Glucose, capillary     Status: Abnormal   Collection Time: 07/29/17  4:45 PM  Result Value Ref Range   Glucose-Capillary 136 (H) 65 - 99 mg/dL  Glucose, capillary     Status: Abnormal   Collection Time: 07/29/17  9:49 PM  Result Value Ref Range   Glucose-Capillary 170 (H) 65 - 99 mg/dL  Glucose, capillary     Status: Abnormal   Collection Time: 07/30/17  6:01 AM  Result Value Ref Range   Glucose-Capillary 138 (H) 65 - 99 mg/dL  Glucose, capillary     Status: Abnormal   Collection Time: 07/30/17 11:43 AM  Result Value Ref Range   Glucose-Capillary 158 (H) 65 - 99 mg/dL   Comment 1 Notify RN   Glucose, capillary     Status: Abnormal   Collection Time: 07/30/17  4:25 PM  Result Value Ref Range   Glucose-Capillary 161 (H) 65 - 99 mg/dL   Comment 1 Notify RN   Glucose, capillary     Status: Abnormal   Collection Time: 07/30/17  9:26 PM  Result Value Ref Range   Glucose-Capillary 165 (H) 65 - 99 mg/dL   Comment 1 Notify RN   Glucose,  capillary     Status: Abnormal   Collection Time: 07/31/17  6:20 AM  Result Value Ref Range   Glucose-Capillary 150 (H) 65 - 99 mg/dL   Comment 1 Notify RN      HEENT: Normocephalic. Atraumatic. Cardio: RRR and no JVD Resp: CTA B/L and unlabored GI: BS positive and ND Skin:   Intact. Warm and dry. Neuro: Alert/Oriented Motor 5/5 in BUE RLE: 3-/5 HF, KE, ADF (partly limited by pain inhibition and spasms) 4-/5 Left HF, KE, ADF (partly limited by pain inhibition and spasms) Musc/Skel:  No edema. No tenderness. Gen NAD. Vital signs reviewed.    Assessment/Plan: 1. 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 Physiatrist is providing close team supervision and 24 hour management of active medical problems listed below. Physiatrist and rehab team continue to assess barriers to discharge/monitor patient progress toward functional and medical goals. FIM: Function - Bathing Position: Sitting EOB Body parts bathed by patient: Right arm, Left arm, Chest, Abdomen, Front perineal area, Right upper leg, Left upper leg Body parts bathed by helper: Buttocks, Right lower leg, Left lower leg, Back Assist Level: Touching or steadying assistance(Pt > 75%)  Function- Upper Body Dressing/Undressing What is the patient wearing?: Bra, Button up shirt, Orthosis Bra - Perfomed by patient: Thread/unthread right bra strap, Thread/unthread left bra strap Bra - Perfomed by helper: Hook/unhook bra (pull down sports bra) Button up shirt - Perfomed by patient: Thread/unthread right sleeve, Pull shirt around back, Button/unbutton shirt, Thread/unthread left sleeve Orthosis activity level: Performed by patient Assist Level: Touching or steadying assistance(Pt > 75%) Function - Lower Body Dressing/Undressing What is the patient wearing?: Socks, Pants Pants- Performed by patient: Thread/unthread right pants  leg, Thread/unthread left pants leg Pants- Performed by helper: Pull pants up/down Socks - Performed by helper: Don/doff right sock, Don/doff left sock Assist for footwear: Dependant Assist for lower body dressing: Touching or steadying assistance (Pt > 75%)  Function - Toileting Toileting activity did not occur: No continent bowel/bladder event  Function - Air cabin crew  transfer activity did not occur: Safety/medical concerns  Function - Chair/bed transfer Chair/bed transfer method: Lateral scoot, Stand pivot Chair/bed transfer assist level: dependent (Pt equals 0%)(supervision for slide board) Chair/bed transfer assistive device: Sliding board, Armrests, Mechanical lift Mechanical lift: Stedy Chair/bed transfer details: Verbal cues for precautions/safety, Verbal cues for sequencing, Verbal cues for safe use of DME/AE, Verbal cues for technique, Manual facilitation for weight shifting  Function - Locomotion: Wheelchair Will patient use wheelchair at discharge?: Yes Type: Manual Max wheelchair distance: 200 Assist Level: Supervision or verbal cues Assist Level: Supervision or verbal cues Assist Level: Supervision or verbal cues Function - Locomotion: Ambulation Ambulation activity did not occur: Safety/medical concerns Walk 10 feet on uneven surfaces activity did not occur: Safety/medical concerns  Function - Comprehension Comprehension: Auditory Comprehension assist level: Follows complex conversation/direction with no assist  Function - Expression Expression: Verbal Expression assist level: Expresses complex ideas: With no assist  Function - Social Interaction Social Interaction assist level: Interacts appropriately with others with medication or extra time (anti-anxiety, antidepressant).  Function - Problem Solving Problem solving assist level: Solves basic problems with no assist  Function - Memory Memory assist level: Complete Independence: No  helper Patient normally able to recall (first 3 days only): Current season, Location of own room, Staff names and faces, That he or she is in a hospital  Medical Problem List and Plan: 1.Decreased functional mobilitysecondary to lumbar spondylosis with radiculopathy. Status post L5-S1 anterior retroperitoneal exposure with laminectomy and fusion 07/26/2017 as well as history of multiple thoracic lumbar surgeries. Back brace when out of bed   Cont CIR  2. DVT Prophylaxis/Anticoagulation: SCDs.    Vascular study negative for DVT.  3. Pain Management:Celebrex 200 mg every 12 hours, Neurontin 300 mg 3 times a day, hydrocodone as needed  Baclofen increased to 20 mg 3 times a day on 11/17, later changed to Robaxin per pt request.  Increased robaxin on 11/18 with tizanidine qHS.  4. Mood:Provide emotional support 5. Neuropsych: This patientiscapable of making decisions on herown behalf. 6. Skin/Wound Care:Routine skin checks 7. Fluids/Electrolytes/Nutrition:Routine I&O's 8.Acute blood loss anemia.   Hb 9.0 on 11/16  Continue to monitor 9.Hypertension. Lisinopril 10 mg daily   Will consider increasing if persistently elevated, likely elevated due to painful spasms at present 10.Diabetes mellitus peripheral neuropathy. Hemoglobin A1c 6.7. Glucophage 500 mg twice a day. Check blood sugars before meals and at bedtime CBG (last 3)  Recent Labs    07/30/17 1625 07/30/17 2126 07/31/17 0620  GLUCAP 161* 165* 150*    Relatively controlled 11/18 11.Constipation. Laxative assistance, increased on 11/18 12.Remote tobacco abuse. Counseling 13. Hypoalbuminemia  Supplement initiated on 11/17  LOS (Days) 3 A FACE TO FACE EVALUATION WAS PERFORMED  Ankit Lorie Phenix 07/31/2017, 7:51 AM

## 2017-07-31 NOTE — Progress Notes (Signed)
Patient medicated with PRN Norco as ordered at [place] for complaint of back/leg pain.  Patient called nurse back with complaint of nausea and was medicated with Zofran at 0257.  Patient requesting something PRN for leg spasms.  Dr. Posey Pronto notified and new order received for PRN dose of Robaxin.    Fredna Dow M

## 2017-07-31 NOTE — Progress Notes (Signed)
Occupational Therapy Session Note  Patient Details  Name: Megan [name] MRN: [id number] Date of Birth: 01/05/1951  Today's Date: 07/31/2017 OT Individual Time: 0935-1015 OT Individual Time Calculation (min): 40 min  and Today's Date: 07/31/2017 OT Missed Time: 35 Minutes Missed Time Reason: Pain;Other (comment)(nausea)   Short Term Goals: Week 1:  OT Short Term Goal 1 (Week 1): Pt will sit to stand with MOD A of 1 wiht RW to decrease burden of care OT Short Term Goal 2 (Week 1): Pt will stand pivot transfer to [facility] with MAX A 1 caregiver OT Short Term Goal 3 (Week 1): Pt will don footwear with AE PRN OT Short Term Goal 4 (Week 1): Pt will bathe LB wiht MIN A PRN  Skilled Therapeutic Interventions/Progress Updates:    1:1. Pt seen in bed with husband present. Pt visibly pale with cold towel over head. Pt reporting nausea, 8/10 pain and BLE spasms. OT alerts RN who delivers pain medication. Pt refusing any mobility and therapy at thi time to allow time for medicine to kick back in. OT assist pt for better positioning and comfort in bed. OT returns in 30 min to see if pt is willing to participate in tx. Upon return pt reporting spams subsided and willing to attempt EOB ADL. Pt supine<>sitting with A for LE management and Vc for log rolling. P reporting dizziness and nausea upon sittin EOB without relief. Pt complete UB bathing with set up and rolls B with A to wash buttocks. Pt dons gown with set up. Exited session with pt seated in bed with call light in reach and all needs met  Therapy Documentation Precautions:  Precautions Precautions: Back, Fall Precaution Booklet Issued: Yes (comment) Precaution Comments: pt able to 3/3 back precautions Required Braces or Orthoses: Spinal Brace, Other Brace/Splint Spinal Brace: Lumbar corset, Applied in sitting position Other Brace/Splint: AFO RLE Restrictions Weight Bearing Restrictions: No General:   Vital Signs:  Pain: Pain  Assessment Pain Score: 8  Pain Type: Acute pain Pain Location: Leg Pain Orientation: Left;Right Pain Descriptors / Indicators: Aching;Discomfort;Spasm Pain Intervention(s): Medication (See eMAR)  See Function Navigator for Current Functional Status.   Therapy/Group: Individual Therapy  Stephanie M Schlosser 07/31/2017, 9:16 AM 

## 2017-08-01 ENCOUNTER — Inpatient Hospital Stay (HOSPITAL_COMMUNITY): Payer: BLUE CROSS/BLUE SHIELD | Admitting: Physical Therapy

## 2017-08-01 ENCOUNTER — Inpatient Hospital Stay (HOSPITAL_COMMUNITY): Payer: BLUE CROSS/BLUE SHIELD | Admitting: Occupational Therapy

## 2017-08-01 ENCOUNTER — Inpatient Hospital Stay (HOSPITAL_COMMUNITY): Payer: BLUE CROSS/BLUE SHIELD

## 2017-08-01 DIAGNOSIS — M5415 Radiculopathy, thoracolumbar region: Secondary | ICD-10-CM

## 2017-08-01 LAB — GLUCOSE, CAPILLARY
GLUCOSE-CAPILLARY: 142 mg/dL — AB (ref 65–99)
GLUCOSE-CAPILLARY: 86 mg/dL (ref 65–99)
Glucose-Capillary: 146 mg/dL — ABNORMAL HIGH (ref 65–99)
Glucose-Capillary: 139 mg/dL — ABNORMAL HIGH (ref 65–99)

## 2017-08-01 MED ORDER — BACLOFEN 20 MG PO TABS
20.0000 mg | ORAL_TABLET | Freq: Four times a day (QID) | ORAL | Status: DC
  Administered 2017-08-01 – 2017-08-02 (×5): 20 mg via ORAL
  Filled 2017-08-01 (×6): qty 1

## 2017-08-01 NOTE — Progress Notes (Signed)
Subjective/Complaints: Felt swimmy headed and nauseated yest when getting  Up, having spasms in BLE, knees bend   ROS: +Lower extremity spasms, nausea. Denies CP, SOB, diarrhea.  Objective: Vital Signs: Blood pressure (!) 118/51, pulse 66, temperature 98 F (36.7 C), temperature source Oral, resp. rate 18, height 5\' 8"  (1.727 m), weight 87 kg (191 lb 14.4 oz), SpO2 97 %. No results found. Results for orders placed or performed during the hospital encounter of 07/28/17 (from the past 72 hour(s))  Glucose, capillary     Status: Abnormal   Collection Time: 07/29/17 11:52 AM  Result Value Ref Range   Glucose-Capillary 162 (H) 65 - 99 mg/dL  Glucose, capillary     Status: Abnormal   Collection Time: 07/29/17  4:45 PM  Result Value Ref Range   Glucose-Capillary 136 (H) 65 - 99 mg/dL  Glucose, capillary     Status: Abnormal   Collection Time: 07/29/17  9:49 PM  Result Value Ref Range   Glucose-Capillary 170 (H) 65 - 99 mg/dL  Glucose, capillary     Status: Abnormal   Collection Time: 07/30/17  6:01 AM  Result Value Ref Range   Glucose-Capillary 138 (H) 65 - 99 mg/dL  Glucose, capillary     Status: Abnormal   Collection Time: 07/30/17 11:43 AM  Result Value Ref Range   Glucose-Capillary 158 (H) 65 - 99 mg/dL   Comment 1 Notify RN   Glucose, capillary     Status: Abnormal   Collection Time: 07/30/17  4:25 PM  Result Value Ref Range   Glucose-Capillary 161 (H) 65 - 99 mg/dL   Comment 1 Notify RN   Glucose, capillary     Status: Abnormal   Collection Time: 07/30/17  9:26 PM  Result Value Ref Range   Glucose-Capillary 165 (H) 65 - 99 mg/dL   Comment 1 Notify RN   Glucose, capillary     Status: Abnormal   Collection Time: 07/31/17  6:20 AM  Result Value Ref Range   Glucose-Capillary 150 (H) 65 - 99 mg/dL   Comment 1 Notify RN   Glucose, capillary     Status: Abnormal   Collection Time: 07/31/17 11:20 AM  Result Value Ref Range   Glucose-Capillary 143 (H) 65 - 99 mg/dL   Glucose, capillary     Status: Abnormal   Collection Time: 07/31/17  4:21 PM  Result Value Ref Range   Glucose-Capillary 147 (H) 65 - 99 mg/dL  Glucose, capillary     Status: Abnormal   Collection Time: 07/31/17  9:13 PM  Result Value Ref Range   Glucose-Capillary 141 (H) 65 - 99 mg/dL   Comment 1 Notify RN   Glucose, capillary     Status: Abnormal   Collection Time: 08/01/17  6:54 AM  Result Value Ref Range   Glucose-Capillary 142 (H) 65 - 99 mg/dL   Comment 1 Notify RN      HEENT: Normocephalic. Atraumatic. Cardio: RRR and no JVD Resp: CTA B/L and unlabored GI: BS positive and ND Skin:   Intact. Warm and dry. Neuro: Alert/Oriented Motor 5/5 in BUE RLE: 3-/5 HF, KE, ADF (partly limited by pain inhibition and spasms) 4-/5 Left HF, KE, ADF (partly limited by pain inhibition and spasms) Musc/Skel:  No edema. No tenderness. Gen NAD. Vital signs reviewed.    Assessment/Plan: 1. 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 Physiatrist is providing close team supervision and 24 hour  management of active medical problems listed below. Physiatrist and rehab team continue to assess barriers to discharge/monitor patient progress toward functional and medical goals. FIM: Function - Bathing Position: Sitting EOB Body parts bathed by patient: Right arm, Left arm, Chest, Abdomen, Front perineal area, Right upper leg, Left upper leg Body parts bathed by helper: Buttocks, Right lower leg, Left lower leg, Back Assist Level: Touching or steadying assistance(Pt > 75%)  Function- Upper Body Dressing/Undressing What is the patient wearing?: Bra, Button up shirt, Orthosis Bra - Perfomed by patient: Thread/unthread right bra strap, Thread/unthread left bra strap Bra - Perfomed by helper: Hook/unhook bra (pull down sports bra) Button up shirt - Perfomed by patient: Thread/unthread right  sleeve, Pull shirt around back, Button/unbutton shirt, Thread/unthread left sleeve Orthosis activity level: Performed by patient Assist Level: Touching or steadying assistance(Pt > 75%) Function - Lower Body Dressing/Undressing What is the patient wearing?: Socks, Pants Pants- Performed by patient: Thread/unthread right pants leg, Thread/unthread left pants leg Pants- Performed by helper: Pull pants up/down Socks - Performed by helper: Don/doff right sock, Don/doff left sock Assist for footwear: Dependant Assist for lower body dressing: Touching or steadying assistance (Pt > 75%)  Function - Toileting Toileting activity did not occur: No continent bowel/bladder event  Function - Air cabin crew transfer activity did not occur: Refused  Function - Chair/bed transfer Chair/bed transfer method: Lateral scoot, Stand pivot Chair/bed transfer assist level: dependent (Pt equals 0%)(supervision for slide board) Chair/bed transfer assistive device: Sliding board, Armrests, Mechanical lift Mechanical lift: Stedy Chair/bed transfer details: Verbal cues for precautions/safety, Verbal cues for sequencing, Verbal cues for safe use of DME/AE, Verbal cues for technique, Manual facilitation for weight shifting  Function - Locomotion: Wheelchair Will patient use wheelchair at discharge?: Yes Type: Manual Max wheelchair distance: 200 Assist Level: Supervision or verbal cues Assist Level: Supervision or verbal cues Assist Level: Supervision or verbal cues Function - Locomotion: Ambulation Ambulation activity did not occur: Safety/medical concerns Walk 10 feet on uneven surfaces activity did not occur: Safety/medical concerns  Function - Comprehension Comprehension: Auditory Comprehension assist level: Follows complex conversation/direction with extra time/assistive device  Function - Expression Expression: Verbal Expression assist level: Expresses complex ideas: With no  assist  Function - Social Interaction Social Interaction assist level: Interacts appropriately with others with medication or extra time (anti-anxiety, antidepressant).  Function - Problem Solving Problem solving assist level: Solves basic problems with no assist  Function - Memory Memory assist level: Complete Independence: No helper Patient normally able to recall (first 3 days only): Current season, Location of own room, Staff names and faces, That he or she is in a hospital  Medical Problem List and Plan: 1.Decreased functional mobilitysecondary to lumbar spondylosis with radiculopathy. Status post L5-S1 anterior retroperitoneal exposure with laminectomy and fusion 07/26/2017 as well as history of multiple thoracic lumbar surgeries. Back brace when out of bed   Cont CIR PT, OT 2. DVT Prophylaxis/Anticoagulation: SCDs.    Vascular study negative for DVT.  3. Pain Management:Celebrex 200 mg every 12 hours, Neurontin 300 mg 3 times a day, hydrocodone as needed  Baclofen increased to 20 mg 3 times a day on 11/17, later changed to Robaxin per pt request.  Discussed with pt that Baclofen rather than robaxin is recommended for spasticity and will restart at 20mg  QID 4. Mood:Provide emotional support 5. Neuropsych: This patientiscapable of making decisions on herown behalf. 6. Skin/Wound Care:Routine skin checks 7. Fluids/Electrolytes/Nutrition:Routine I&O's 8.Acute blood loss anemia.   Hb 9.0 on  11/16  Continue to monitor 9.Hypertension. Lisinopril 10 mg daily   Fluctuating, monitor Vitals:   07/31/17 1428 08/01/17 0500  BP: (!) 152/67 (!) 118/51  Pulse: 73 66  Resp: 18 18  Temp: 99.3 F (37.4 C) 98 F (36.7 C)  SpO2: 96% 97%   10.Diabetes mellitus peripheral neuropathy. Hemoglobin A1c 6.7. Glucophage 500 mg twice a day. Check blood sugars before meals and at bedtime CBG (last 3)  Recent Labs    07/31/17 1621 07/31/17 2113 08/01/17 0654  GLUCAP 147* 141* 142*     Relatively controlled 11/19 11.Constipation. Laxative assistance, increased on 11/18 12.Remote tobacco abuse. Counseling 13. Hypoalbuminemia  Supplement initiated on 11/17  LOS (Days) 4 A FACE TO FACE EVALUATION WAS PERFORMED  Charlett Blake 08/01/2017, 7:38 AM

## 2017-08-01 NOTE — Progress Notes (Signed)
Occupational Therapy Session Note  Patient Details  Name: Megan [name] MRN: [id number] Date of Birth: 02/10/1951  Today's Date: 08/01/2017 OT Individual Time: 1346-1416 OT Individual Time Calculation (min): 30 min    Short Term Goals: Week 1:  OT Short Term Goal 1 (Week 1): Pt will sit to stand with MOD A of 1 wiht RW to decrease burden of care OT Short Term Goal 2 (Week 1): Pt will stand pivot transfer to [facility] with MAX A 1 caregiver OT Short Term Goal 3 (Week 1): Pt will don footwear with AE PRN OT Short Term Goal 4 (Week 1): Pt will bathe LB wiht MIN A PRN  Skilled Therapeutic Interventions/Progress Updates:    Pt received supine in bed, RN completing administering of meds, Pt agreeable to OT tx session. Pt reporting increased pain in R hip, though agreeable to attempt EOB activity. Pt completes bed mobility using log roll technique with ModA for LE placement and to assist with bringing trunk upright. Pt tolerates sitting EOB approx 2 min, reporting increased pain in R hip and requesting need to return to supine, with ModA for LE management during transfer. Pt completed bathing at bed level with assist for lower LEs, setup assist provided for donning new gown. Pt then engaged in bil UE exercises including forward flexion approx 0-90*, forward and backward rows for 10 reps each movement. Pt positioned for comfort in bed and use of pillows to facilitate alignment of LEs and RLE with tendency to rotate externally. Pt left supine in bed, call bell and needs within reach.   Therapy Documentation Precautions:  Precautions Precautions: Back, Fall Precaution Booklet Issued: Yes (comment) Precaution Comments: pt able to 3/3 back precautions Required Braces or Orthoses: Spinal Brace, Other Brace/Splint Spinal Brace: Lumbar corset, Applied in sitting position Other Brace/Splint: AFO RLE Restrictions Weight Bearing Restrictions: No    Pain: Pain Assessment Pain Assessment: Faces Pain  Score: 4  Faces Pain Scale: Hurts whole lot Pain Type: Acute pain Pain Location: Hip Pain Orientation: Right Pain Descriptors / Indicators: Shooting Pain Frequency: Intermittent Pain Onset: With Activity Patients Stated Pain Goal: 2 Pain Intervention(s): Rest;Repositioned  See Function Navigator for Current Functional Status.   Therapy/Group: Individual Therapy  Raymondo Band 08/01/2017, 4:44 PM

## 2017-08-01 NOTE — Progress Notes (Signed)
Physical Therapy Session Note  Patient Details  Name: Megan [name] MRN: [id number] Date of Birth: Feb 06, 1951  Today's Date: 08/01/2017 PT Individual Time: 0900-1000 PT Individual Time Calculation (min): 60 min   Short Term Goals: Week 1:  PT Short Term Goal 1 (Week 1): Pt will tranfser with +1 assist without lift equipment PT Short Term Goal 2 (Week 1): Pt will initiate gait training with LRAD PT Short Term Goal 3 (Week 1): Pt will tolerate 1 minute standing with BUE support x2 minutes  Skilled Therapeutic Interventions/Progress Updates: Pt presented in bed agreeable to therapy. Pt indicated pain in R hip (prevsiouly pre-medicated) which is new after most recent surgery. Pt performed supine therex to tolerance including KTC with yellow physioball, ankle pumps, QS, GS x 10 bilaterally. Performed supine to sit modA with use of features. Pt able to sit up at EOB x 5 min with no increase in nausea/dizziness. Pt agreeable to transfer to w/c. Performed SB transfer to w/c to R minA and increased time. Pt propelled to rehab gym supervision and declined any standing attempts. Pt performed additional LE therex LAQ, hip abd/add x 15 bilaterally then increasing c/o nausea. Pt transported back to room and performed SB transfer to L min guard returned to bed. Sit to supine minA for LE management and pt positioned to comfort with pt indicating sx decreasing. Pt left in bed with call bell within reach and needs met.      Therapy Documentation Precautions:  Precautions Precautions: Back, Fall Precaution Booklet Issued: Yes (comment) Precaution Comments: pt able to 3/3 back precautions Required Braces or Orthoses: Spinal Brace, Other Brace/Splint Spinal Brace: Lumbar corset, Applied in sitting position Other Brace/Splint: AFO RLE Restrictions Weight Bearing Restrictions: No   See Function Navigator for Current Functional Status.   Therapy/Group: Individual Therapy  [name]  Denissa Cozart, PTA  08/01/2017, 12:12 PM

## 2017-08-01 NOTE — Plan of Care (Signed)
  Progressing RH SKIN INTEGRITY RH STG SKIN FREE OF INFECTION/BREAKDOWN Description Patient will remain free of skin breakdown and infection while on rehab with min assist  08/01/2017 1342 - Progressing by Brita Romp, RN RH STG MAINTAIN SKIN INTEGRITY WITH ASSISTANCE Description STG Maintain Skin Integrity With Assistance. Min  08/01/2017 1342 - Progressing by Brita Romp, RN   Not Progressing SCI BOWEL ELIMINATION RH STG MANAGE BOWEL WITH ASSISTANCE Description STG Manage Bowel with Assistance. Min  08/01/2017 1342 - Not Progressing by Brita Romp, RN RH STG SCI MANAGE BOWEL WITH MEDICATION WITH ASSISTANCE Description STG SCI Manage bowel with medication with assistance. Min  08/01/2017 1342 - Not Progressing by Brita Romp, RN Note Requiring digital stimulation with suppository to empty SCI BLADDER ELIMINATION RH STG MANAGE BLADDER WITH ASSISTANCE Description STG Manage Bladder With Assistance. Min  08/01/2017 1342 - Not Progressing by Brita Romp, RN Note Incontinent of bladder and bowel; calls to be changed.  Bladder scans have been low volumes RH PAIN MANAGEMENT RH STG PAIN MANAGED AT OR BELOW PT'S PAIN GOAL Description Less than 4  08/01/2017 1342 - Not Progressing by Brita Romp, RN Note Changes made to medication regimen today.  Frequently complains of pain before PRN dose due.  Complains of pain in right hip mostly.

## 2017-08-01 NOTE — Plan of Care (Signed)
  SCI BOWEL ELIMINATION RH STG SCI MANAGE BOWEL WITH MEDICATION WITH ASSISTANCE Description STG SCI Manage bowel with medication with assistance. Min  08/01/2017 0143 - Not Progressing by Gwendolyn Grant, RN Note Manual disimpaction, laxatives given.   SCI BLADDER ELIMINATION RH STG MANAGE BLADDER WITH ASSISTANCE Description STG Manage Bladder With Assistance. Min  08/01/2017 0143 - Not Progressing by Gwendolyn Grant, RN Note Pt incontinent of urine requiring max assist from staff.

## 2017-08-01 NOTE — Progress Notes (Signed)
Physical Therapy Session Note  Patient Details  Name: Megan [name] MRN: [id number] Date of Birth: 1950-11-18  Today's Date: 08/01/2017 PT Individual Time: 1515-1616 PT Individual Time Calculation (min): 61 min   Short Term Goals: Week 1:  PT Short Term Goal 1 (Week 1): Pt will tranfser with +1 assist without lift equipment PT Short Term Goal 2 (Week 1): Pt will initiate gait training with LRAD PT Short Term Goal 3 (Week 1): Pt will tolerate 1 minute standing with BUE support x2 minutes  Skilled Therapeutic Interventions/Progress Updates:    c/o "mild" pain at rest but 8/10 pain when RLE spasms.  Pt agreeable to attempt therapy.  Requires mod assist to come to sitting EOB and on sitting noted to be incontinent of urine.  Pt attempted x3 to transition to standing with stedy and unable to do so without +2 assist.  Sit<>stand from elevated stedy seat with +2 assist and transitioned to recliner.  PT provided total assist for pericare in standing in stedy before pt sat in recliner with max assist to control descent.  Pt able to power up to squat position with UEs in recliner for repositioning of brief x2.  Pt engaged in lengthy discussion regarding spasms in LEs and methods to reduce spasticity including medical management, stretching, heat, etc.  Pt positioned with moist heat to RLE, careful to avoid areas of low sensation, call bell in reach and needs met.   Therapy Documentation Precautions:  Precautions Precautions: Back, Fall Precaution Booklet Issued: Yes (comment) Precaution Comments: pt able to 3/3 back precautions Required Braces or Orthoses: Spinal Brace, Other Brace/Splint Spinal Brace: Lumbar corset, Applied in sitting position Other Brace/Splint: AFO RLE Restrictions Weight Bearing Restrictions: No General: PT Amount of Missed Time (min): 14 Minutes PT Missed Treatment Reason: Pain   See Function Navigator for Current Functional Status.   Therapy/Group: Individual  Therapy  Michel Santee 08/01/2017, 4:22 PM

## 2017-08-01 NOTE — Plan of Care (Signed)
Education centered on neurogenic bowel and bladder management, and skin care.  Patients medications have been changed a bit today to assist patient to empty bladder and prevent muscle spasms.  Patient is incontinent of bladder and bowel but has sensation that she has gone and will call to be changed.  Bowel program is being established with use of suppository WITH digital stimulation to evacuate the bowels.  Incisions assessed.  Educated patient on what to expect with incisional care once honeycomb dressing is removed.  Also discussed importance of boosting and turning in the bed to prevent pressure injuries.  Will continue to reinforce teaching.  Brita Romp, RN

## 2017-08-01 NOTE — Progress Notes (Signed)
Physical Therapy Session Note  Patient Details  Name: Megan [name] MRN: [id number] Date of Birth: Jan 08, 1951  Today's Date: 08/01/2017 PT Individual Time: 1015-1045 PT Individual Time Calculation (min): 30 min   Short Term Goals: Week 1:  PT Short Term Goal 1 (Week 1): Pt will tranfser with +1 assist without lift equipment PT Short Term Goal 2 (Week 1): Pt will initiate gait training with LRAD PT Short Term Goal 3 (Week 1): Pt will tolerate 1 minute standing with BUE support x2 minutes  Skilled Therapeutic Interventions/Progress Updates:    pt reports she just returned to bed with last therapist due to increasing "swimmy headedness" feeling after being up in the w/c for a period of time. C/o increased pain and increased spasms noted in RLE. Focused on PROM and stretching to R heel cord, hip flexion, hamstring stretch, hip abduction/adduction in supine, and hip IR/ER wit 30 second hold x 3 reps for stretching and 10 reps for ROM. Pt reports relief and decrease in spasms noted. Educated pt on how to perform some self ROM/stretching or how to instruct family members (reports usually her husband or daughter are present) in these for relief. Pt grateful for the information and left with all needs in reach.   Therapy Documentation Precautions:  Precautions Precautions: Back, Fall Precaution Booklet Issued: Yes (comment) Precaution Comments: pt able to 3/3 back precautions Required Braces or Orthoses: Spinal Brace, Other Brace/Splint Spinal Brace: Lumbar corset, Applied in sitting position Other Brace/Splint: AFO RLE Restrictions Weight Bearing Restrictions: No  Pain:  Reports pain in RLE and increased spasms - premedicated.    See Function Navigator for Current Functional Status.   Therapy/Group: Individual Therapy  Canary Brim Ivory Broad, PT, DPT  08/01/2017, 12:03 PM

## 2017-08-02 ENCOUNTER — Inpatient Hospital Stay (HOSPITAL_COMMUNITY): Payer: BLUE CROSS/BLUE SHIELD | Admitting: Physical Therapy

## 2017-08-02 ENCOUNTER — Inpatient Hospital Stay (HOSPITAL_COMMUNITY): Payer: BLUE CROSS/BLUE SHIELD | Admitting: Occupational Therapy

## 2017-08-02 LAB — GLUCOSE, CAPILLARY
GLUCOSE-CAPILLARY: 147 mg/dL — AB (ref 65–99)
GLUCOSE-CAPILLARY: 135 mg/dL — AB (ref 65–99)
Glucose-Capillary: 146 mg/dL — ABNORMAL HIGH (ref 65–99)
Glucose-Capillary: 135 mg/dL — ABNORMAL HIGH (ref 65–99)

## 2017-08-02 MED ORDER — BISACODYL 10 MG RE SUPP
10.0000 mg | Freq: Every day | RECTAL | Status: DC
  Administered 2017-08-03 – 2017-08-11 (×3): 10 mg via RECTAL
  Filled 2017-08-02 (×9): qty 1

## 2017-08-02 MED ORDER — LISINOPRIL 20 MG PO TABS
20.0000 mg | ORAL_TABLET | Freq: Every day | ORAL | Status: DC
  Administered 2017-08-03 – 2017-08-15 (×10): 20 mg via ORAL
  Filled 2017-08-02 (×15): qty 1

## 2017-08-02 MED ORDER — BACLOFEN 5 MG HALF TABLET
25.0000 mg | ORAL_TABLET | Freq: Four times a day (QID) | ORAL | Status: DC
  Administered 2017-08-02 – 2017-08-03 (×4): 25 mg via ORAL
  Filled 2017-08-02 (×5): qty 1

## 2017-08-02 MED ORDER — TIZANIDINE HCL 2 MG PO TABS
2.0000 mg | ORAL_TABLET | Freq: Every day | ORAL | Status: DC
  Administered 2017-08-02 – 2017-08-04 (×4): 2 mg via ORAL
  Filled 2017-08-02 (×4): qty 1

## 2017-08-02 NOTE — Progress Notes (Signed)
Physical Therapy Session Note  Patient Details  Name: Megan [name] MRN: [id number] Date of Birth: 11-28-1950  Today's Date: 08/02/2017 PT Concurrent Time: 1300-1400 PT Concurrent Time Calculation (min): 60 min  Short Term Goals: Week 1:  PT Short Term Goal 1 (Week 1): Pt will tranfser with +1 assist without lift equipment PT Short Term Goal 2 (Week 1): Pt will initiate gait training with LRAD PT Short Term Goal 3 (Week 1): Pt will tolerate 1 minute standing with BUE support x2 minutes  Skilled Therapeutic Interventions/Progress Updates:    no c/o pain at rest but significant pain with movement, LUE and RLE spasming throughout session and pt requiring significantly increased time for all mobility.  Requires mod assist for rolling L and R and for supine<>sit.  Sit<>stand from elevated bed into stedy with 3 attempts and ultimately requiring total assist from PT to elevated hips and bring chest upright.  Pt positioned self in w/c and able to propel to therapy gym with UEs and increased time.  PT applied kinesiotape to L ant-tib and provided education on signs of intolerance and contraindications.  Pt returned to room at end of session, requires total assist to stand to stedy from w/c and +2 assist to stand from elevated stedy seat to return to bed, mod assist for LEs into bed.  Pt positioned to comfort with call bell in reach and needs met.   Therapy Documentation Precautions:  Precautions Precautions: Back, Fall Precaution Booklet Issued: Yes (comment) Precaution Comments: pt able to 3/3 back precautions Required Braces or Orthoses: Spinal Brace, Other Brace/Splint Spinal Brace: Lumbar corset, Applied in sitting position Other Brace/Splint: AFO RLE Restrictions Weight Bearing Restrictions: No   See Function Navigator for Current Functional Status.   Therapy/Group: Individual Therapy  Michel Santee 08/02/2017, 3:17 PM

## 2017-08-02 NOTE — Progress Notes (Signed)
Subjective/Complaints:  Cont to have RLE spasm, R knee bends  ROS: +Lower extremity spasms, nausea. Denies CP, SOB, diarrhea.  Objective: Vital Signs: Blood pressure (!) 175/95, pulse 96, temperature 98 F (36.7 C), temperature source Oral, resp. rate 20, height 5\' 8"  (1.727 m), weight 87 kg (191 lb 14.4 oz), SpO2 99 %. No results found. Results for orders placed or performed during the hospital encounter of 07/28/17 (from the past 72 hour(s))  Glucose, capillary     Status: Abnormal   Collection Time: 07/30/17 11:43 AM  Result Value Ref Range   Glucose-Capillary 158 (H) 65 - 99 mg/dL   Comment 1 Notify RN   Glucose, capillary     Status: Abnormal   Collection Time: 07/30/17  4:25 PM  Result Value Ref Range   Glucose-Capillary 161 (H) 65 - 99 mg/dL   Comment 1 Notify RN   Glucose, capillary     Status: Abnormal   Collection Time: 07/30/17  9:26 PM  Result Value Ref Range   Glucose-Capillary 165 (H) 65 - 99 mg/dL   Comment 1 Notify RN   Glucose, capillary     Status: Abnormal   Collection Time: 07/31/17  6:20 AM  Result Value Ref Range   Glucose-Capillary 150 (H) 65 - 99 mg/dL   Comment 1 Notify RN   Glucose, capillary     Status: Abnormal   Collection Time: 07/31/17 11:20 AM  Result Value Ref Range   Glucose-Capillary 143 (H) 65 - 99 mg/dL  Glucose, capillary     Status: Abnormal   Collection Time: 07/31/17  4:21 PM  Result Value Ref Range   Glucose-Capillary 147 (H) 65 - 99 mg/dL  Glucose, capillary     Status: Abnormal   Collection Time: 07/31/17  9:13 PM  Result Value Ref Range   Glucose-Capillary 141 (H) 65 - 99 mg/dL   Comment 1 Notify RN   Glucose, capillary     Status: Abnormal   Collection Time: 08/01/17  6:54 AM  Result Value Ref Range   Glucose-Capillary 142 (H) 65 - 99 mg/dL   Comment 1 Notify RN   Glucose, capillary     Status: Abnormal   Collection Time: 08/01/17 11:44 AM  Result Value Ref Range   Glucose-Capillary 146 (H) 65 - 99 mg/dL  Glucose,  capillary     Status: None   Collection Time: 08/01/17  5:03 PM  Result Value Ref Range   Glucose-Capillary 86 65 - 99 mg/dL   Comment 1 Notify RN   Glucose, capillary     Status: Abnormal   Collection Time: 08/01/17  8:50 PM  Result Value Ref Range   Glucose-Capillary 139 (H) 65 - 99 mg/dL  Glucose, capillary     Status: Abnormal   Collection Time: 08/02/17  6:14 AM  Result Value Ref Range   Glucose-Capillary 146 (H) 65 - 99 mg/dL   Comment 1 Document in Chart      HEENT: Normocephalic. Atraumatic. Cardio: RRR and no JVD Resp: CTA B/L and unlabored GI: BS positive and ND Skin:   Intact. Warm and dry. Neuro: Alert/Oriented Motor 5/5 in BUE RLE: 3-/5 HF, KE, ADF (partly limited by pain inhibition and spasms) 4-/5 Left HF, KE, ADF (partly limited by pain inhibition and spasms) Musc/Skel:  No edema. No tenderness. Gen NAD. Vital signs reviewed.    Assessment/Plan: 1. 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 Physiatrist is providing  close team supervision and 24 hour management of active medical problems listed below. Physiatrist and rehab team continue to assess barriers to discharge/monitor patient progress toward functional and medical goals. FIM: Function - Bathing Position: Bed Body parts bathed by patient: Right arm, Left arm, Chest, Abdomen, Right upper leg, Left upper leg Body parts bathed by helper: Right lower leg, Left lower leg Bathing not applicable: Buttocks, Front perineal area Assist Level: Touching or steadying assistance(Pt > 75%)  Function- Upper Body Dressing/Undressing What is the patient wearing?: Hospital gown Bra - Perfomed by patient: Thread/unthread right bra strap, Thread/unthread left bra strap Bra - Perfomed by helper: Hook/unhook bra (pull down sports bra) Button up shirt - Perfomed by patient: Thread/unthread right sleeve, Pull shirt  around back, Button/unbutton shirt, Thread/unthread left sleeve Orthosis activity level: Performed by patient Assist Level: Set up Set up : To obtain clothing/put away Function - Lower Body Dressing/Undressing What is the patient wearing?: Non-skid slipper socks Pants- Performed by patient: Thread/unthread right pants leg, Thread/unthread left pants leg Pants- Performed by helper: Pull pants up/down Non-skid slipper socks- Performed by helper: Don/doff left sock, Don/doff right sock Socks - Performed by helper: Don/doff right sock, Don/doff left sock Assist for footwear: Dependant Assist for lower body dressing: Touching or steadying assistance (Pt > 75%)(2/5, 40%, max assist)  Function - Toileting Toileting activity did not occur: No continent bowel/bladder event Toileting steps completed by helper: Adjust clothing prior to toileting, Performs perineal hygiene, Adjust clothing after toileting Assist level: Two helpers  Function - Air cabin crew transfer activity did not occur: Refused  Function - Chair/bed transfer Chair/bed transfer method: Other Chair/bed transfer assist level: 2 helpers Chair/bed transfer assistive device: Mechanical lift Mechanical lift: Stedy Chair/bed transfer details: Verbal cues for precautions/safety, Verbal cues for sequencing, Verbal cues for safe use of DME/AE, Verbal cues for technique, Manual facilitation for weight shifting  Function - Locomotion: Wheelchair Will patient use wheelchair at discharge?: Yes Type: Manual Max wheelchair distance: 200 Assist Level: Supervision or verbal cues Assist Level: Supervision or verbal cues Assist Level: Supervision or verbal cues Function - Locomotion: Ambulation Ambulation activity did not occur: Safety/medical concerns Walk 10 feet on uneven surfaces activity did not occur: Safety/medical concerns  Function - Comprehension Comprehension: Auditory Comprehension assist level: Follows complex  conversation/direction with extra time/assistive device  Function - Expression Expression: Verbal Expression assist level: Expresses complex ideas: With no assist  Function - Social Interaction Social Interaction assist level: Interacts appropriately with others with medication or extra time (anti-anxiety, antidepressant).  Function - Problem Solving Problem solving assist level: Solves basic problems with no assist  Function - Memory Memory assist level: Complete Independence: No helper Patient normally able to recall (first 3 days only): Current season, Location of own room, Staff names and faces, That he or she is in a hospital  Medical Problem List and Plan: 1.Decreased functional mobilitysecondary to lumbar spondylosis with radiculopathy. Status post L5-S1 anterior retroperitoneal exposure with laminectomy and fusion 07/26/2017 as well as history of multiple thoracic lumbar surgeries. Back brace when out of bed   Cont CIR PT, OT team conf in am 2. DVT Prophylaxis/Anticoagulation: SCDs.    Vascular study negative for DVT.  3. Pain Management:Celebrex 200 mg every 12 hours, Neurontin 300 mg 3 times a day, hydrocodone as needed  Baclofen increased to 20 mg 3 times a day on 11/17, later changed to Robaxin per pt request.  Discussed with pt that Baclofen rather than robaxin is recommended for spasticity  and will increase to 25mg  QID, with tizanidine 2mg  qhs, may slowly titrate up 4. Mood:Provide emotional support 5. Neuropsych: This patientiscapable of making decisions on herown behalf. 6. Skin/Wound Care:Routine skin checks 7. Fluids/Electrolytes/Nutrition:Routine I&O's 8.Acute blood loss anemia.   Hb 9.0 on 11/16  Continue to monitor 9.Hypertension. Lisinopril 10 mg daily   Increase to 20mg  11/20 Vitals:   08/01/17 1459 08/02/17 0526  BP: (!) 151/61 (!) 175/95  Pulse: 75 96  Resp: 20   Temp: 98.6 F (37 C) 98 F (36.7 C)  SpO2: 100% 99%   10.Diabetes mellitus  peripheral neuropathy. Hemoglobin A1c 6.7. Glucophage 500 mg twice a day. Check blood sugars before meals and at bedtime CBG (last 3)  Recent Labs    08/01/17 1703 08/01/17 2050 08/02/17 0614  GLUCAP 86 139* 146*    controlled 11/20 11.Constipation. Laxative assistance, increased on 11/18 12.Remote tobacco abuse. Counseling 13. Hypoalbuminemia  Supplement initiated on 11/17  LOS (Days) 5 A FACE TO FACE EVALUATION WAS PERFORMED  Charlett Blake 08/02/2017, 7:50 AM

## 2017-08-02 NOTE — Plan of Care (Signed)
Pt was incontinent of Bowel and Bladder C/o spasms

## 2017-08-02 NOTE — Progress Notes (Signed)
Occupational Therapy Session Note  Patient Details  Name: Megan [name] MRN: [id number] Date of Birth: 19-Oct-1950  Today's Date: 08/02/2017 OT Individual Time: 2633-3545 OT Individual Time Calculation (min): 72 min    Short Term Goals: Week 1:  OT Short Term Goal 1 (Week 1): Pt will sit to stand with MOD A of 1 wiht RW to decrease burden of care OT Short Term Goal 2 (Week 1): Pt will stand pivot transfer to [facility] with MAX A 1 caregiver OT Short Term Goal 3 (Week 1): Pt will don footwear with AE PRN OT Short Term Goal 4 (Week 1): Pt will bathe LB wiht MIN A PRN  Skilled Therapeutic Interventions/Progress Updates:    Pt greeted semi-reclined in bed and agreeable to OT treatment session focused on modified bathing/dressing. Pt with R LE spasms with any movement which would come and go throughout OT session. Pt reports 8/10 pain in hip as well with any movement which pt states is new since surgery.Provided gentle massage and PROM to R LE for spasms. Bed level bathing/dressing completed 2/2 severe pain. Pt set-up for bath and was able to wash upper body without assist. Pt needed min A to roll L and R with pillow between legs while OT assisted with washing buttocks and brief change. Pt unable to achieve figure 4 position in bed 2/2 pain to help thread pant legs, requiring total A for LB dressing. After bathing/dressing, tried to sit EOB with pt, but pt unable to tolerate advancing LE's toward EOB. Pt left semi-reclined in bed with needs met, informed RN and PA of pt pain status.   Therapy Documentation Precautions:  Precautions Precautions: Back, Fall Precaution Booklet Issued: Yes (comment) Precaution Comments: pt able to 3/3 back precautions Required Braces or Orthoses: Spinal Brace, Other Brace/Splint Spinal Brace: Lumbar corset, Applied in sitting position Other Brace/Splint: AFO RLE Restrictions Weight Bearing Restrictions: No Pain: Pain Assessment Pain Assessment: 0-10 Pain Score: 9   Faces Pain Scale: Hurts whole lot Pain Type: Acute pain Pain Location: Hip Pain Orientation: Right Pain Descriptors / Indicators: Aching;Shooting;Spasm Pain Frequency: Intermittent Pain Onset: With Activity Patients Stated Pain Goal: 2 Pain Intervention(s): Repositioned  See Function Navigator for Current Functional Status.   Therapy/Group: Individual Therapy  [name] 08/02/2017, 12:23 PM

## 2017-08-02 NOTE — Progress Notes (Signed)
Physical Therapy Session Note  Patient Details  Name: Megan [name] MRN: [id number] Date of Birth: 1951/07/10  Today's Date: 08/02/2017 PT Individual Time: 1530-1545 PT Individual Time Calculation (min): 15 min   Short Term Goals: Week 1:  PT Short Term Goal 1 (Week 1): Pt will tranfser with +1 assist without lift equipment PT Short Term Goal 2 (Week 1): Pt will initiate gait training with LRAD PT Short Term Goal 3 (Week 1): Pt will tolerate 1 minute standing with BUE support x2 minutes  Skilled Therapeutic Interventions/Progress Updates:    Pt c/o 10/10 pain in back/hip and declining out of bed therapy.  PT provided education on goals of care and continued mobility but pt continued to decline.  Discussed ability to alter intensity of therapy schedule if pain continued to limit pt's ability to participate.  Also discussed continued pt performing HEP in room and she verbalized understanding.  Missed 45 minutes skilled PT.   Therapy Documentation Precautions:  Precautions Precautions: Back, Fall Precaution Booklet Issued: Yes (comment) Precaution Comments: pt able to 3/3 back precautions Required Braces or Orthoses: Spinal Brace, Other Brace/Splint Spinal Brace: Lumbar corset, Applied in sitting position Other Brace/Splint: AFO RLE Restrictions Weight Bearing Restrictions: No General: PT Amount of Missed Time (min): 45 Minutes PT Missed Treatment Reason: Pain   See Function Navigator for Current Functional Status.   Therapy/Group: Individual Therapy  Michel Santee 08/02/2017, 3:57 PM

## 2017-08-03 ENCOUNTER — Inpatient Hospital Stay (HOSPITAL_COMMUNITY): Payer: BLUE CROSS/BLUE SHIELD | Admitting: Physical Therapy

## 2017-08-03 ENCOUNTER — Inpatient Hospital Stay (HOSPITAL_COMMUNITY): Payer: BLUE CROSS/BLUE SHIELD | Admitting: Occupational Therapy

## 2017-08-03 LAB — GLUCOSE, CAPILLARY
GLUCOSE-CAPILLARY: 117 mg/dL — AB (ref 65–99)
GLUCOSE-CAPILLARY: 143 mg/dL — AB (ref 65–99)
GLUCOSE-CAPILLARY: 196 mg/dL — AB (ref 65–99)
Glucose-Capillary: 126 mg/dL — ABNORMAL HIGH (ref 65–99)

## 2017-08-03 MED ORDER — SENNOSIDES-DOCUSATE SODIUM 8.6-50 MG PO TABS
2.0000 | ORAL_TABLET | Freq: Two times a day (BID) | ORAL | Status: DC
  Administered 2017-08-03 – 2017-08-06 (×7): 2 via ORAL
  Filled 2017-08-03 (×8): qty 2

## 2017-08-03 MED ORDER — METHOCARBAMOL 500 MG PO TABS
1000.0000 mg | ORAL_TABLET | Freq: Four times a day (QID) | ORAL | Status: DC
  Administered 2017-08-03 – 2017-08-17 (×55): 1000 mg via ORAL
  Filled 2017-08-03 (×56): qty 2

## 2017-08-03 NOTE — Progress Notes (Signed)
Physical Therapy Session Note  Patient Details  Name: Megan [name] MRN: [id number] Date of Birth: 01/23/1951  Today's Date: 08/03/2017 PT Individual Time: 1320-1350 PT Individual Time Calculation (min): 30 min  and Today's Date: 08/03/2017 PT Missed Time: 30 Minutes Missed Time Reason: Pain  Short Term Goals: Week 1:  PT Short Term Goal 1 (Week 1): Pt will tranfser with +1 assist without lift equipment PT Short Term Goal 2 (Week 1): Pt will initiate gait training with LRAD PT Short Term Goal 3 (Week 1): Pt will tolerate 1 minute standing with BUE support x2 minutes  Skilled Therapeutic Interventions/Progress Updates:   Pt asleep in bed upon arrival, easily awakened and refusing to participate in OOB activity 2/2 pain/spasms as detailed in previous notes this date. Offered to assist w/ sitting up so pt could finish eating, pt continued to decline. Pt reports stretching in morning PT session helped w/ spasms, however wanting to finish eating lunch prior to stretching this afternoon. Missed 30 minutes of skilled pt 2/2 pain and refusal while eating. Worked on RLE strength and ROM this session. Pt performed AAROM - SLR 2x10, heel slides, 1x10, IR/ER 2x10, quad sets 1x10, and ankle pumps 1x20. Also performed passive hamstring and gastroc stretch 3x15 sec and rhythmic initiation of PF/DF for spasm pain relief. No increase in pain w/ activities, but increased tingling and spasms towards end of session and declined further PT. Repositioned (w/ pillows) RLE in normal alignment to decrease resting in excessive ER. Ended session in care of family, all needs met.   Therapy Documentation Precautions:  Precautions Precautions: Back, Fall Precaution Booklet Issued: Yes (comment) Precaution Comments: pt able to 3/3 back precautions Required Braces or Orthoses: Spinal Brace, Other Brace/Splint Spinal Brace: Lumbar corset, Applied in sitting position Other Brace/Splint: AFO RLE Restrictions Weight  Bearing Restrictions: No General: PT Amount of Missed Time (min): 30 Minutes PT Missed Treatment Reason: Pain Pain: Pain Assessment Pain Assessment: 0-10 Pain Score: 10-Worst pain ever Pain Type: Acute pain Pain Location: Leg Pain Orientation: Right Pain Descriptors / Indicators: Spasm Pain Frequency: Intermittent Pain Onset: Sudden Patients Stated Pain Goal: 3 Pain Intervention(s): Medication (See eMAR)  See Function Navigator for Current Functional Status.   Therapy/Group: Individual Therapy  Cephus Tupy K Arnette 08/03/2017, 1:59 PM

## 2017-08-03 NOTE — Progress Notes (Signed)
Subjective/Complaints: Patient seen lying in bed this morning. She states she slept well until about 6 AM when she awoke with spasms. Overall she states his spasms are improving.  ROS: +Lower extremity spasms. Denies CP, SOB, diarrhea.  Objective: Vital Signs: Blood pressure 138/76, pulse 79, temperature 97.9 F (36.6 C), temperature source Oral, resp. rate 18, height 5\' 8"  (1.727 m), weight 87 kg (191 lb 14.4 oz), SpO2 100 %. No results found. Results for orders placed or performed during the hospital encounter of 07/28/17 (from the past 72 hour(s))  Glucose, capillary     Status: Abnormal   Collection Time: 07/31/17 11:20 AM  Result Value Ref Range   Glucose-Capillary 143 (H) 65 - 99 mg/dL  Glucose, capillary     Status: Abnormal   Collection Time: 07/31/17  4:21 PM  Result Value Ref Range   Glucose-Capillary 147 (H) 65 - 99 mg/dL  Glucose, capillary     Status: Abnormal   Collection Time: 07/31/17  9:13 PM  Result Value Ref Range   Glucose-Capillary 141 (H) 65 - 99 mg/dL   Comment 1 Notify RN   Glucose, capillary     Status: Abnormal   Collection Time: 08/01/17  6:54 AM  Result Value Ref Range   Glucose-Capillary 142 (H) 65 - 99 mg/dL   Comment 1 Notify RN   Glucose, capillary     Status: Abnormal   Collection Time: 08/01/17 11:44 AM  Result Value Ref Range   Glucose-Capillary 146 (H) 65 - 99 mg/dL  Glucose, capillary     Status: None   Collection Time: 08/01/17  5:03 PM  Result Value Ref Range   Glucose-Capillary 86 65 - 99 mg/dL   Comment 1 Notify RN   Glucose, capillary     Status: Abnormal   Collection Time: 08/01/17  8:50 PM  Result Value Ref Range   Glucose-Capillary 139 (H) 65 - 99 mg/dL  Glucose, capillary     Status: Abnormal   Collection Time: 08/02/17  6:14 AM  Result Value Ref Range   Glucose-Capillary 146 (H) 65 - 99 mg/dL   Comment 1 Document in Chart   Glucose, capillary     Status: Abnormal   Collection Time: 08/02/17 11:30 AM  Result Value Ref  Range   Glucose-Capillary 147 (H) 65 - 99 mg/dL  Glucose, capillary     Status: Abnormal   Collection Time: 08/02/17  4:21 PM  Result Value Ref Range   Glucose-Capillary 135 (H) 65 - 99 mg/dL  Glucose, capillary     Status: Abnormal   Collection Time: 08/02/17  9:27 PM  Result Value Ref Range   Glucose-Capillary 135 (H) 65 - 99 mg/dL  Glucose, capillary     Status: Abnormal   Collection Time: 08/03/17  6:18 AM  Result Value Ref Range   Glucose-Capillary 126 (H) 65 - 99 mg/dL     HEENT: Normocephalic. Atraumatic. Cardio: RRR and no JVD Resp: CTA B/L and unlabored GI: BS positive and ND Skin:   Intact. Warm and dry. Neuro: Alert/Oriented Motor 5/5 in BUE RLE: 3-/5 HF, KE, ADF (partly limited by pain inhibition and spasms, persistent) 4-/5 Left HF, KE, ADF (partly limited by pain inhibition and spasms) Musc/Skel:  No edema. No tenderness. Gen NAD. Vital signs reviewed.    Assessment/Plan: 1. 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 Physiatrist is providing close team supervision and 24 hour management of active medical  problems listed below. Physiatrist and rehab team continue to assess barriers to discharge/monitor patient progress toward functional and medical goals. FIM: Function - Bathing Position: Bed Body parts bathed by patient: Right arm, Left arm, Chest, Abdomen, Right upper leg, Left upper leg Body parts bathed by helper: Right lower leg, Left lower leg Bathing not applicable: Buttocks, Front perineal area Assist Level: Touching or steadying assistance(Pt > 75%)  Function- Upper Body Dressing/Undressing What is the patient wearing?: Hospital gown Bra - Perfomed by patient: Thread/unthread right bra strap, Thread/unthread left bra strap Bra - Perfomed by helper: Hook/unhook bra (pull down sports bra) Button up shirt - Perfomed by patient: Thread/unthread right  sleeve, Pull shirt around back, Button/unbutton shirt, Thread/unthread left sleeve Orthosis activity level: Performed by patient Assist Level: Set up Set up : To obtain clothing/put away Function - Lower Body Dressing/Undressing What is the patient wearing?: Non-skid slipper socks Pants- Performed by patient: Thread/unthread right pants leg, Thread/unthread left pants leg Pants- Performed by helper: Pull pants up/down Non-skid slipper socks- Performed by helper: Don/doff left sock, Don/doff right sock Socks - Performed by helper: Don/doff right sock, Don/doff left sock Assist for footwear: Dependant Assist for lower body dressing: Touching or steadying assistance (Pt > 75%)(2/5, 40%, max assist)  Function - Toileting Toileting activity did not occur: No continent bowel/bladder event Toileting steps completed by helper: Adjust clothing prior to toileting, Performs perineal hygiene, Adjust clothing after toileting Assist level: Two helpers  Function - Air cabin crew transfer activity did not occur: Refused  Function - Chair/bed transfer Chair/bed transfer method: Other Chair/bed transfer assist level: dependent (Pt equals 0%) Chair/bed transfer assistive device: Mechanical lift Mechanical lift: Stedy Chair/bed transfer details: Verbal cues for precautions/safety, Verbal cues for sequencing, Verbal cues for safe use of DME/AE, Verbal cues for technique, Manual facilitation for weight shifting  Function - Locomotion: Wheelchair Will patient use wheelchair at discharge?: Yes Type: Manual Max wheelchair distance: 200 Assist Level: Supervision or verbal cues Assist Level: Supervision or verbal cues Assist Level: Supervision or verbal cues Function - Locomotion: Ambulation Ambulation activity did not occur: Safety/medical concerns Walk 10 feet on uneven surfaces activity did not occur: Safety/medical concerns  Function - Comprehension Comprehension: Auditory Comprehension  assist level: Follows complex conversation/direction with extra time/assistive device  Function - Expression Expression: Verbal Expression assist level: Expresses complex ideas: With no assist  Function - Social Interaction Social Interaction assist level: Interacts appropriately with others with medication or extra time (anti-anxiety, antidepressant).  Function - Problem Solving Problem solving assist level: Solves basic problems with no assist  Function - Memory Memory assist level: Complete Independence: No helper Patient normally able to recall (first 3 days only): Current season, Location of own room, Staff names and faces, That he or she is in a hospital  Medical Problem List and Plan: 1.Decreased functional mobilitysecondary to lumbar spondylosis with radiculopathy. Status post L5-S1 anterior retroperitoneal exposure with laminectomy and fusion 07/26/2017 as well as history of multiple thoracic lumbar surgeries. Back brace when out of bed   Cont CIR  2. DVT Prophylaxis/Anticoagulation: SCDs.    Vascular study negative for DVT.  3. Pain Management:Celebrex 200 mg every 12 hours, Neurontin 300 mg 3 times a day, hydrocodone as needed  Baclofen increased to 20 mg 3 times a day on 11/17, later changed to Robaxin per pt request.  Have discussed with pt that Baclofen rather than robaxin is recommended for spasticity and will increase to 25mg  QID, with tizanidine 2mg  qhs, may slowly  titrate up 4. Mood:Provide emotional support 5. Neuropsych: This patientiscapable of making decisions on herown behalf. 6. Skin/Wound Care:Routine skin checks 7. Fluids/Electrolytes/Nutrition:Routine I&O's   Labs ordered for tomorrow, including Mag 8.Acute blood loss anemia.   Hb 9.0 on 11/16  Labs ordered for tomorrow  Continue to monitor 9.Hypertension. Lisinopril 10 mg daily   Increased to 20mg  11/20  Appears to be improving Vitals:   08/02/17 1433 08/03/17 0536  BP: (!) 158/78 138/76   Pulse: 76 79  Resp: 18 18  Temp: 98.3 F (36.8 C) 97.9 F (36.6 C)  SpO2: 100% 100%   10.Diabetes mellitus peripheral neuropathy. Hemoglobin A1c 6.7. Glucophage 500 mg twice a day. Check blood sugars before meals and at bedtime CBG (last 3)  Recent Labs    08/02/17 1621 08/02/17 2127 08/03/17 0618  GLUCAP 135* 135* 126*     Overall controlled 11/21 11.Constipation. Laxative assistance, increased on 11/18, increased again on 11/21 12.Remote tobacco abuse. Counseling 13. Hypoalbuminemia  Supplement initiated on 11/17  LOS (Days) 6 A FACE TO FACE EVALUATION WAS PERFORMED  Ankit Lorie Phenix 08/03/2017, 7:59 AM

## 2017-08-03 NOTE — Progress Notes (Signed)
Occupational Therapy Session Note  Patient Details  Name: Megan [name] MRN: [id number] Date of Birth: 12/04/1950  Today's Date: 08/03/2017 OT Individual Time: 1415-1515 OT Individual Time Calculation (min): 60 min   Short Term Goals: Week 1:  OT Short Term Goal 1 (Week 1): Pt will sit to stand with MOD A of 1 wiht RW to decrease burden of care OT Short Term Goal 2 (Week 1): Pt will stand pivot transfer to [facility] with MAX A 1 caregiver OT Short Term Goal 3 (Week 1): Pt will don footwear with AE PRN OT Short Term Goal 4 (Week 1): Pt will bathe LB wiht MIN A PRN  Skilled Therapeutic Interventions/Progress Updates:    Pt greeted semi-reclined in bed with daughter present. Discussed plan of care and OT goals with pt and family. Pt with concerns about changes in medications, but hopeful that changes made today by MD will help. Pt continues to report R hip pain but states muscle spasms in R leg has improved. Pt agreeable to try to sit EOB. Pillow placed between legs to maintain neutral hip position, then completed log roll into sidelying with Max A for LB. Total A to then advance LE's off of bed and mod A and raised HOB to elevate trunk. Pt with increased lumbar pain in sitting, tolerated sitting ~4 mins. Max A for small scoot along EOB but had difficulty weight shifting 2/2 pain. Total A to return to bed. Total +2 to scoot up in bed with bed in trendelenburg. Rolling L and R for chuck pad adjustment with mod A. Pt left semi-reclined in bed with needs met and family present.   Therapy Documentation Precautions:  Precautions Precautions: Back, Fall Precaution Booklet Issued: Yes (comment) Precaution Comments: pt able to 3/3 back precautions Required Braces or Orthoses: Spinal Brace, Other Brace/Splint Spinal Brace: Lumbar corset, Applied in sitting position Other Brace/Splint: AFO RLE Restrictions Weight Bearing Restrictions: No Pain: Pain Assessment Pain Assessment: 0-10 Pain Score: 8 Pain  Type: Acute pain Pain Location: back Pain Orientation: lower Pain Descriptors / Indicators: aching Pain Frequency: Intermittent Pain Onset: with activity  Patients Stated Pain Goal: 3 Pain Intervention(s):Repositioned  See Function Navigator for Current Functional Status.  Therapy/Group: Individual Therapy  [name] 08/03/2017, 2:42 PM

## 2017-08-03 NOTE — Patient Care Conference (Signed)
Inpatient RehabilitationTeam Conference and Plan of Care Update Date: 08/03/2017   Time: 9:55 Am    Patient Name: Megan [name]      Medical Record Number: [id number]  Date of Birth: [birth year] Sex: Female         Room/Bed: 4M12C/4M12C-01 Payor Info: Payor: [place] / Plan: [place] / Product Type: *No Product type* /    Admitting Diagnosis: lumbosacral fusion  Admit Date/Time:  07/28/2017  3:33 PM Admission Comments: No comment available   Primary Diagnosis:  <principal problem not specified> Principal Problem: <principal problem not specified>  Patient Active Problem List   Diagnosis Date Noted  . Constipation due to pain medication   . Muscle spasms of both lower extremities   . Type 2 diabetes mellitus with peripheral neuropathy (HCC)   . Hypoalbuminemia due to protein-calorie malnutrition ([place])   . Radiculopathy 07/28/2017  . Fusion of spine, lumbosacral region   . Acute blood loss anemia   . Benign essential HTN   . Diabetes mellitus type 2 in obese ([place])   . Lumbosacral spondylosis with radiculopathy 07/26/2017  . Sensation of pressure in bladder area 01/31/2017  . Paraplegia, incomplete () 01/07/2017  . Neurogenic bladder 12/27/2016  . 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/27/2016  . Myelopathy ([place]) 12/21/2016  . Surgery, elective   . Diabetes mellitus type 2 in nonobese (HCC)   . History of lumbar fusion   . History of fusion of cervical spine   . Neuropathic pain   . Post-operative pain   . 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 Paraparesis ([place]) 12/15/2016  . Pedal edema 12/14/2016  . Type 2 diabetes mellitus without complication, without long-term current use of insulin ([place]) 11/02/2016  . Hyperlipidemia 11/02/2016  . Essential hypertension 11/02/2016  . 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    Expected Discharge Date: Expected Discharge Date: 08/19/17  Team  Members Present: Physician leading conference: Dr. Delice Lesch Social Worker Present: Ovidio Kin, LCSW Nurse Present: Junius Creamer, RN PT Present: Dwyane Dee, PT OT Present: Cherylynn Ridges, OT SLP Present: Windell Moulding, SLP PPS Coordinator present : Daiva Nakayama, RN, CRRN     Current Status/Progress Goal Weekly Team Focus  Medical   Decreased functional mobility secondary to lumbar spondylosis with radiculopathy. Status post L5-S1 anterior retroperitoneal exposure with laminectomy and fusion 07/26/2017 as well as history of multiple thoracic lumbar surgeries.  Improve mobility, DM, bowels, HTN, spasms  See above   Bowel/Bladder   incontinent of bladder purwick hs, bowel program supporsitory and dig 7am   manage incontinent eposide, maintain regular bowel pattern  Assist patient with toileting needs, timed toileting   Swallow/Nutrition/ Hydration             ADL's   Max A overall, limited by R hip pain and muscle spasms  Min A/supervision overall  pain management, activity tolerance, LB/UB strengthening, modified bathing/dressing   Mobility   min/supervision with slide board but doesn't want to use slide board, total>+2 with stedy, havent been able to progress to gait due to spsams/pain/nausea  supervision overall  progressing mobility as able, strengthening, pain control   Communication             Safety/Cognition/ Behavioral Observations            Pain   no c/o pain  no c/o pain  Assess pain q shift and prn   Skin   abdominal incision with honeycomb  dressing/ back incision with honeycomb dressing  no new skin issues  Assess skin q shift and prn    Rehab Goals Patient on target to meet rehab goals: Yes Rehab Goals Revised: none *See Care Plan and progress notes for long and short-term goals.     Barriers to Discharge  Current Status/Progress Possible Resolutions Date Resolved   Physician    Decreased caregiver support;Medical stability     See above  Therapies,  optimize DM/HTN, bowel meds.  Follow labs.  Optimize spasm meds      Nursing                  PT                    OT                  SLP                SW                Discharge Planning/Teaching Needs:  Pt to return to her home with her husband, dtr, neighbor, and friend to assist with min A level of care.  Family to come for family education closer to d/c.   Team Discussion:  Difficulty with participating in therapies due to spasms and pain issues. When has pain is incontinent of urine. MD changing pain medications. Will need to see if new pain medications help with her ability to attend therapies. Goals are supervision-min assist level. Know more at next conference.  Revisions to Treatment Plan:  DC 12/7    Continued Need for Acute Rehabilitation Level of Care: The patient requires daily medical management by a physician with specialized training in physical medicine and rehabilitation for the following conditions: Daily direction of a multidisciplinary physical rehabilitation program to ensure safe treatment while eliciting the highest outcome that is of practical value to the patient.: Yes Daily medical management of patient stability for increased activity during participation in an intensive rehabilitation regime.: Yes Daily analysis of laboratory values and/or radiology reports with any subsequent need for medication adjustment of medical intervention for : Post surgical problems;Neurological problems;Other;Diabetes problems;Blood pressure problems  Elease Hashimoto 08/03/2017, 1:31 PM

## 2017-08-03 NOTE — Progress Notes (Signed)
Social Work Patient ID: Megan [name], female   DOB: 1951-07-20, 66 y.o.   MRN: [id number]  Met with pt to discuss team conference goals supervision-min assist level and her inability to participate due to the spasms she is having. MD has adjusted pain medicines and is hopeful this will relieve her spasms. Will know more next week regarding if realistic goals and if able to do more in her therapies. Discharge target date is 12/7.

## 2017-08-03 NOTE — Progress Notes (Signed)
Physical Therapy Session Note  Patient Details  Name: Megan [name] MRN: [id number] Date of Birth: October 12, 1950  Today's Date: 08/03/2017 PT Individual Time: 9499-7182 and 1100-1125 PT Individual Time Calculation (min): 30 min and 25 minutes  Short Term Goals: Week 1:  PT Short Term Goal 1 (Week 1): Pt will tranfser with +1 assist without lift equipment PT Short Term Goal 2 (Week 1): Pt will initiate gait training with LRAD PT Short Term Goal 3 (Week 1): Pt will tolerate 1 minute standing with BUE support x2 minutes  Skilled Therapeutic Interventions/Progress Updates:    Session 1: Pt c/o 8/10 pain with ongoing spasms and nausea.  Declines out of bed therapy this session, but agreeable to try some grooming from bed level.  PT provided pt with set up assist for oral care and face/hands washing.  Pt unable to complete oral care at this time due to spasms and pain.  PT provided education on current POC and goals of therapy and instructed pt in deep breathing for pain management.  Left semi reclined in bed, call bell in reach and needs met.   Session 2: Pt with no improvement in pain or spasms since AM session.  PT again discussed plan of care as well as general information from conference regarding MD adjusting medications and continuing to try gentle stretch and gentle isometric strengthening exercises while not in therapy.  PT provided gentle stretch to glutes, hamstrings, and calves 2x30 with noted visible spasms in hamstrings and gastroc throughout.  Pt declined to attempt any further therapy.  Missed 35 minutes skilled PT.   Therapy Documentation Precautions:  Precautions Precautions: Back, Fall Precaution Booklet Issued: Yes (comment) Precaution Comments: pt able to 3/3 back precautions Required Braces or Orthoses: Spinal Brace, Other Brace/Splint Spinal Brace: Lumbar corset, Applied in sitting position Other Brace/Splint: AFO RLE Restrictions Weight Bearing Restrictions: No   See  Function Navigator for Current Functional Status.   Therapy/Group: Individual Therapy  Michel Santee 08/03/2017, 11:57 AM

## 2017-08-04 LAB — GLUCOSE, CAPILLARY
GLUCOSE-CAPILLARY: 155 mg/dL — AB (ref 65–99)
GLUCOSE-CAPILLARY: 140 mg/dL — AB (ref 65–99)
Glucose-Capillary: 148 mg/dL — ABNORMAL HIGH (ref 65–99)
Glucose-Capillary: 136 mg/dL — ABNORMAL HIGH (ref 65–99)

## 2017-08-04 LAB — CBC WITH DIFFERENTIAL/PLATELET
Basophils Absolute: 0 10*3/uL (ref 0.0–0.1)
Basophils Relative: 0 %
EOS ABS: 0.4 10*3/uL (ref 0.0–0.7)
Eosinophils Relative: 6 %
HEMATOCRIT: 29.6 % — AB (ref 36.0–46.0)
HEMOGLOBIN: 9.6 g/dL — AB (ref 12.0–15.0)
LYMPHS PCT: 26 %
Lymphs Abs: 1.7 10*3/uL (ref 0.7–4.0)
MCH: 28.8 pg (ref 26.0–34.0)
MCHC: 32.4 g/dL (ref 30.0–36.0)
MCV: 88.9 fL (ref 78.0–100.0)
Monocytes Absolute: 0.5 10*3/uL (ref 0.1–1.0)
Monocytes Relative: 7 %
NEUTROS ABS: 3.9 10*3/uL (ref 1.7–7.7)
NEUTROS PCT: 61 %
PLATELETS: 332 10*3/uL (ref 150–400)
RBC: 3.33 MIL/uL — AB (ref 3.87–5.11)
RDW: 13.2 % (ref 11.5–15.5)
WBC: 6.4 10*3/uL (ref 4.0–10.5)

## 2017-08-04 LAB — BASIC METABOLIC PANEL WITH GFR
Anion gap: 8 (ref 5–15)
BUN: 16 mg/dL (ref 6–20)
CO2: 25 mmol/L (ref 22–32)
Calcium: 8.9 mg/dL (ref 8.9–10.3)
Chloride: 106 mmol/L (ref 101–111)
Creatinine, Ser: 0.73 mg/dL (ref 0.44–1.00)
GFR calc Af Amer: >60 mL/min
GFR calc non Af Amer: >60 mL/min
Glucose, Bld: 147 mg/dL — ABNORMAL HIGH (ref 65–99)
Potassium: 4.4 mmol/L (ref 3.5–5.1)
Sodium: 139 mmol/L (ref 135–145)

## 2017-08-04 LAB — MAGNESIUM: Magnesium: 2 mg/dL (ref 1.7–2.4)

## 2017-08-04 NOTE — Progress Notes (Signed)
Subjective/Complaints: Patient seen lying in bed this morning. She states she slept better overnight.She states she feels the same, but looks more comfortable this morning. She states she had some improvement with the changing in medication yesterday. She has questions about right hip pain.  ROS: +Right hip pain. Denies CP, SOB, diarrhea.  Objective: Vital Signs: Blood pressure (!) 109/57, pulse 73, temperature 97.8 F (36.6 C), temperature source Oral, resp. rate 18, height _0  (1.727 m), weight 87 kg (191 lb 14.4 oz), SpO2 98 %. No results found. Results for orders placed or performed during the hospital encounter of 07/28/17 (from the past 72 hour(s))  Glucose, capillary     Status: Abnormal   Collection Time: 08/01/17 11:44 AM  Result Value Ref Range   Glucose-Capillary 146 (H) 65 - 99 mg/dL  Glucose, capillary     Status: None   Collection Time: 08/01/17  5:03 PM  Result Value Ref Range   Glucose-Capillary 86 65 - 99 mg/dL   Comment 1 Notify RN   Glucose, capillary     Status: Abnormal   Collection Time: 08/01/17  8:50 PM  Result Value Ref Range   Glucose-Capillary 139 (H) 65 - 99 mg/dL  Glucose, capillary     Status: Abnormal   Collection Time: 08/02/17  6:14 AM  Result Value Ref Range   Glucose-Capillary 146 (H) 65 - 99 mg/dL   Comment 1 Document in Chart   Glucose, capillary     Status: Abnormal   Collection Time: 08/02/17 11:30 AM  Result Value Ref Range   Glucose-Capillary 147 (H) 65 - 99 mg/dL  Glucose, capillary     Status: Abnormal   Collection Time: 08/02/17  4:21 PM  Result Value Ref Range   Glucose-Capillary 135 (H) 65 - 99 mg/dL  Glucose, capillary     Status: Abnormal   Collection Time: 08/02/17  9:27 PM  Result Value Ref Range   Glucose-Capillary 135 (H) 65 - 99 mg/dL  Glucose, capillary     Status: Abnormal   Collection Time: 08/03/17  6:18 AM  Result Value Ref Range   Glucose-Capillary 126 (H) 65 - 99 mg/dL  Glucose, capillary     Status: Abnormal    Collection Time: 08/03/17 11:43 AM  Result Value Ref Range   Glucose-Capillary 117 (H) 65 - 99 mg/dL   Comment 1 Notify RN   Glucose, capillary     Status: Abnormal   Collection Time: 08/03/17  5:07 PM  Result Value Ref Range   Glucose-Capillary 143 (H) 65 - 99 mg/dL  Glucose, capillary     Status: Abnormal   Collection Time: 08/03/17  9:23 PM  Result Value Ref Range   Glucose-Capillary 196 (H) 65 - 99 mg/dL  CBC with Differential/Platelet     Status: Abnormal   Collection Time: 08/04/17  4:40 AM  Result Value Ref Range   WBC 6.4 4.0 - 10.5 K/uL   RBC 3.33 (L) 3.87 - 5.11 MIL/uL   Hemoglobin 9.6 (L) 12.0 - 15.0 g/dL   HCT 29.6 (L) 36.0 - 46.0 %   MCV 88.9 78.0 - 100.0 fL   MCH 28.8 26.0 - 34.0 pg   MCHC 32.4 30.0 - 36.0 g/dL   RDW 13.2 11.5 - 15.5 %   Platelets 332 150 - 400 K/uL   Neutrophils Relative % 61 %   Neutro Abs 3.9 1.7 - 7.7 K/uL   Lymphocytes Relative 26 %   Lymphs Abs 1.7 0.7 - 4.0 K/uL  Monocytes Relative 7 %   Monocytes Absolute 0.5 0.1 - 1.0 K/uL   Eosinophils Relative 6 %   Eosinophils Absolute 0.4 0.0 - 0.7 K/uL   Basophils Relative 0 %   Basophils Absolute 0.0 0.0 - 0.1 K/uL  Basic metabolic panel     Status: Abnormal   Collection Time: 08/04/17  4:40 AM  Result Value Ref Range   Sodium 139 135 - 145 mmol/L   Potassium 4.4 3.5 - 5.1 mmol/L   Chloride 106 101 - 111 mmol/L   CO2 25 22 - 32 mmol/L   Glucose, Bld 147 (H) 65 - 99 mg/dL   BUN 16 6 - 20 mg/dL   Creatinine, Ser 0.73 0.44 - 1.00 mg/dL   Calcium 8.9 8.9 - 10.3 mg/dL   GFR calc non Af Amer >60 >60 mL/min   GFR calc Af Amer >60 >60 mL/min    Comment: (NOTE) The eGFR has been calculated using the CKD EPI equation. This calculation has not been validated in all clinical situations. eGFR's persistently <60 mL/min signify possible Chronic Kidney Disease.    Anion gap 8 5 - 15  Magnesium     Status: None   Collection Time: 08/04/17  4:40 AM  Result Value Ref Range   Magnesium 2.0 1.7 -  2.4 mg/dL  Glucose, capillary     Status: Abnormal   Collection Time: 08/04/17  6:04 AM  Result Value Ref Range   Glucose-Capillary 148 (H) 65 - 99 mg/dL     HEENT: Normocephalic. Atraumatic. Cardio: RRR and no JVD Resp: CTA B/L and unlabored GI: BS positive and ND Skin:   Intact. Warm and dry. Neuro: Alert/Oriented Motor 5/5 in BUE RLE: 3-/5 HF, KE, ADF (partly limited by pain inhibition and spasms, stable) 4-/5 Left HF, KE, ADF (partly limited by pain inhibition and spasms) Musc/Skel:  No edema. No tenderness. Gen NAD. Vital signs reviewed.    Assessment/Plan: 1. 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 Physiatrist is providing close team supervision and 24 hour management of active medical problems listed below. Physiatrist and rehab team continue to assess barriers to discharge/monitor patient progress toward functional and medical goals. FIM: Function - Bathing Position: Bed Body parts bathed by patient: Right arm, Left arm, Chest, Abdomen, Right upper leg, Left upper leg Body parts bathed by helper: Right lower leg, Left lower leg Bathing not applicable: Buttocks, Front perineal area Assist Level: Touching or steadying assistance(Pt > 75%)  Function- Upper Body Dressing/Undressing What is the patient wearing?: Hospital gown Bra - Perfomed by patient: Thread/unthread right bra strap, Thread/unthread left bra strap Bra - Perfomed by helper: Hook/unhook bra (pull down sports bra) Button up shirt - Perfomed by patient: Thread/unthread right sleeve, Pull shirt around back, Button/unbutton shirt, Thread/unthread left sleeve Orthosis activity level: Performed by patient Assist Level: Set up Set up : To obtain clothing/put away Function - Lower Body Dressing/Undressing What is the patient wearing?: Non-skid slipper socks Pants- Performed by patient: Thread/unthread right  pants leg, Thread/unthread left pants leg Pants- Performed by helper: Pull pants up/down Non-skid slipper socks- Performed by helper: Don/doff left sock, Don/doff right sock Socks - Performed by helper: Don/doff right sock, Don/doff left sock Assist for footwear: Dependant Assist for lower body dressing: Touching or steadying assistance (Pt > 75%)(2/5, 40%, max assist)  Function - Toileting Toileting activity did not occur: No continent bowel/bladder event Toileting steps completed by helper: Adjust  clothing prior to toileting, Performs perineal hygiene, Adjust clothing after toileting Assist level: Two helpers  Function - Air cabin crew transfer activity did not occur: Refused  Function - Chair/bed transfer Chair/bed transfer method: Other Chair/bed transfer assist level: dependent (Pt equals 0%) Chair/bed transfer assistive device: Mechanical lift Mechanical lift: Stedy Chair/bed transfer details: Verbal cues for precautions/safety, Verbal cues for sequencing, Verbal cues for safe use of DME/AE, Verbal cues for technique, Manual facilitation for weight shifting  Function - Locomotion: Wheelchair Will patient use wheelchair at discharge?: Yes Type: Manual Max wheelchair distance: 200 Assist Level: Supervision or verbal cues Assist Level: Supervision or verbal cues Assist Level: Supervision or verbal cues Function - Locomotion: Ambulation Ambulation activity did not occur: Safety/medical concerns Walk 10 feet on uneven surfaces activity did not occur: Safety/medical concerns  Function - Comprehension Comprehension: Auditory Comprehension assist level: Follows complex conversation/direction with extra time/assistive device  Function - Expression Expression: Verbal Expression assist level: Expresses complex ideas: With no assist  Function - Social Interaction Social Interaction assist level: Interacts appropriately with others with medication or extra time  (anti-anxiety, antidepressant).  Function - Problem Solving Problem solving assist level: Solves basic problems with no assist  Function - Memory Memory assist level: Complete Independence: No helper Patient normally able to recall (first 3 days only): Current season, Location of own room, Staff names and faces, That he or she is in a hospital  Medical Problem List and Plan: 1.Decreased functional mobilitysecondary to lumbar spondylosis with radiculopathy. Status post L5-S1 anterior retroperitoneal exposure with laminectomy and fusion 07/26/2017 as well as history of multiple thoracic lumbar surgeries. Back brace when out of bed   Cont CIR  2. DVT Prophylaxis/Anticoagulation: SCDs.    Vascular study negative for DVT.  3. Pain Management:Celebrex 200 mg every 12 hours, Neurontin 300 mg 3 times a day, hydrocodone as needed  Baclofen changed back to Robaxin 1000 mg 4 times a day, with tizanidine up to 58m qhs. She notes she has some improvement with this. 4. Mood:Provide emotional support 5. Neuropsych: This patientiscapable of making decisions on herown behalf. 6. Skin/Wound Care:Routine skin checks 7. Fluids/Electrolytes/Nutrition:Routine I&O's   BMP within acceptable range on 11/22 8.Acute blood loss anemia.   Hb 9.6 on 11/22  Continue to monitor 9.Hypertension. Lisinopril 10 mg daily   Increased to 261m11/20  Trending down, continue to monitor Vitals:   08/03/17 1441 08/04/17 0500  BP: (!) 157/60 (!) 109/57  Pulse: 91 73  Resp:  18  Temp: 98 F (36.7 C) 97.8 F (36.6 C)  SpO2: 100% 98%   10.Diabetes mellitus peripheral neuropathy. Hemoglobin A1c 6.7. Glucophage 500 mg twice a day. Check blood sugars before meals and at bedtime CBG (last 3)  Recent Labs    08/03/17 1707 08/03/17 2123 08/04/17 0604  GLUCAP 143* 196* 148*     Overall controlled 11/22 11.Constipation. Laxative assistance, increased on 11/18, increased again on 11/21 12.Remote tobacco  abuse. Counseling 13. Hypoalbuminemia  Supplement initiated on 11/17  LOS (Days) 7 A FACE TO FACE EVALUATION WAS PERFORMED  Megan [name] Megan Phenix1/22/2018, 8:03 AM

## 2017-08-05 ENCOUNTER — Inpatient Hospital Stay (HOSPITAL_COMMUNITY): Payer: BLUE CROSS/BLUE SHIELD | Admitting: Occupational Therapy

## 2017-08-05 ENCOUNTER — Inpatient Hospital Stay (HOSPITAL_COMMUNITY): Payer: BLUE CROSS/BLUE SHIELD

## 2017-08-05 ENCOUNTER — Other Ambulatory Visit: Payer: Self-pay

## 2017-08-05 DIAGNOSIS — K592 Neurogenic bowel, not elsewhere classified: Secondary | ICD-10-CM

## 2017-08-05 LAB — GLUCOSE, CAPILLARY
GLUCOSE-CAPILLARY: 158 mg/dL — AB (ref 65–99)
GLUCOSE-CAPILLARY: 121 mg/dL — AB (ref 65–99)
Glucose-Capillary: 184 mg/dL — ABNORMAL HIGH (ref 65–99)

## 2017-08-05 NOTE — Progress Notes (Signed)
Physical Therapy Session Note  Patient Details  Name: Megan [name] MRN: [id number] Date of Birth: [birth year]  Today's Date: 08/05/2017 PT Individual Time: 0900-0954 PT Individual Time Calculation (min): 54 min   Short Term Goals: Week 1:  PT Short Term Goal 1 (Week 1): Pt will tranfser with +1 assist without lift equipment PT Short Term Goal 2 (Week 1): Pt will initiate gait training with LRAD PT Short Term Goal 3 (Week 1): Pt will tolerate 1 minute standing with BUE support x2 minutes  Skilled Therapeutic Interventions/Progress Updates:    Pt seated in w/c upon PT arrival, agreeable to therapy tx and reports pain 7/10 in back, relieved with rest. Pt transported in w/c to gym. Pt performed x 5 attempts at sit<>stand in parallel bars but unable to come fully up to standing from w/c height despite max assist. Pt performed seated therex: 2 x 10 LAQ with L LE 1.75#, 2 x 10 quad sets with R LE, 2 x 10 seated marches (unable to fully pick R foot off floor), 2 x 10 hip abduction B LEs with TB. Pt propelled w/c x 100 ft back to room using B UEs with supervision. Pt performed squat pivot transfer from w/c>bed with min assist, verbal cues for technique. Pt performed x 3 sit<>stands from elevated bed height with max assist and using RW for UE support. Pt only able to maintain standing about 30-40 seconds each time, working on hip/knee extension, tactile cues for R quad activation. Pt transferred bed>w/c squat pivot with min assist, left seated in w/c with needs in reach.   Therapy Documentation Precautions:  Precautions Precautions: Back, Fall Precaution Booklet Issued: Yes (comment) Precaution Comments: pt able to 3/3 back precautions Required Braces or Orthoses: Spinal Brace, Other Brace/Splint Spinal Brace: Lumbar corset, Applied in sitting position Other Brace/Splint: AFO RLE Restrictions Weight Bearing Restrictions: No   See Function Navigator for Current Functional  Status.   Therapy/Group: Individual Therapy  Netta Corrigan, PT, DPT 08/05/2017, 7:49 AM

## 2017-08-05 NOTE — Progress Notes (Signed)
Occupational Therapy Session Note  Patient Details  Name: Megan [name] MRN: [id number] Date of Birth: 1951/01/01  Today's Date: 08/05/2017 OT Individual Time: 0830-0900 OT Individual Time Calculation (min): 30 min    Short Term Goals: Week 1:  OT Short Term Goal 1 (Week 1): Pt will sit to stand with MOD A of 1 wiht RW to decrease burden of care OT Short Term Goal 2 (Week 1): Pt will stand pivot transfer to [facility] with MAX A 1 caregiver OT Short Term Goal 3 (Week 1): Pt will don footwear with AE PRN OT Short Term Goal 4 (Week 1): Pt will bathe LB wiht MIN A PRN  Skilled Therapeutic Interventions/Progress Updates:    1:1 focus on bed mobility with mod A with a fo bilateral LEs. At EOB pt dressed.  Trial of using a reacher to threading pants. Pt still required A lift LE into pants. From slightly elvated bed pt able to come into standing (with shoes with AFO donned) with mod A and able to maintain standing with mod A while OT pulled up pants. Pt able to control to decent to the bed with mod A. Pt perform squat pivot transfer with mod A into w/c on her left.  Left up in the w/c inprep for next session.   Therapy Documentation Precautions:  Precautions Precautions: Back, Fall Precaution Booklet Issued: Yes (comment) Precaution Comments: pt able to 3/3 back precautions Required Braces or Orthoses: Spinal Brace, Other Brace/Splint Spinal Brace: Lumbar corset, Applied in sitting position Other Brace/Splint: AFO RLE Restrictions Weight Bearing Restrictions: No Pain: Pain Assessment Pain Score: 2   See Function Navigator for Current Functional Status.   Therapy/Group: Individual Therapy  [facility] 08/05/2017, 12:50 PM

## 2017-08-05 NOTE — Progress Notes (Signed)
SSE given to patient for moderate constipation.  Very good results.  Patient states she feels better.  Brita Romp, RN

## 2017-08-05 NOTE — Progress Notes (Signed)
Subjective/Complaints: Pt seen laying in bed this AM.  She did not sleep overnight because she states she had some blurry vision, which she attributed to the Tizanidine.   ROS: Denies CP, SOB, diarrhea.  Objective: Vital Signs: Blood pressure (!) 143/64, pulse 70, temperature 97.7 F (36.5 C), temperature source Oral, resp. rate 17, height 5' 8"  (1.727 m), weight 87 kg (191 lb 14.4 oz), SpO2 98 %. No results found. Results for orders placed or performed during the hospital encounter of 07/28/17 (from the past 72 hour(s))  Glucose, capillary     Status: Abnormal   Collection Time: 08/02/17 11:30 AM  Result Value Ref Range   Glucose-Capillary 147 (H) 65 - 99 mg/dL  Glucose, capillary     Status: Abnormal   Collection Time: 08/02/17  4:21 PM  Result Value Ref Range   Glucose-Capillary 135 (H) 65 - 99 mg/dL  Glucose, capillary     Status: Abnormal   Collection Time: 08/02/17  9:27 PM  Result Value Ref Range   Glucose-Capillary 135 (H) 65 - 99 mg/dL  Glucose, capillary     Status: Abnormal   Collection Time: 08/03/17  6:18 AM  Result Value Ref Range   Glucose-Capillary 126 (H) 65 - 99 mg/dL  Glucose, capillary     Status: Abnormal   Collection Time: 08/03/17 11:43 AM  Result Value Ref Range   Glucose-Capillary 117 (H) 65 - 99 mg/dL   Comment 1 Notify RN   Glucose, capillary     Status: Abnormal   Collection Time: 08/03/17  5:07 PM  Result Value Ref Range   Glucose-Capillary 143 (H) 65 - 99 mg/dL  Glucose, capillary     Status: Abnormal   Collection Time: 08/03/17  9:23 PM  Result Value Ref Range   Glucose-Capillary 196 (H) 65 - 99 mg/dL  CBC with Differential/Platelet     Status: Abnormal   Collection Time: 08/04/17  4:40 AM  Result Value Ref Range   WBC 6.4 4.0 - 10.5 K/uL   RBC 3.33 (L) 3.87 - 5.11 MIL/uL   Hemoglobin 9.6 (L) 12.0 - 15.0 g/dL   HCT 29.6 (L) 36.0 - 46.0 %   MCV 88.9 78.0 - 100.0 fL   MCH 28.8 26.0 - 34.0 pg   MCHC 32.4 30.0 - 36.0 g/dL   RDW 13.2 11.5  - 15.5 %   Platelets 332 150 - 400 K/uL   Neutrophils Relative % 61 %   Neutro Abs 3.9 1.7 - 7.7 K/uL   Lymphocytes Relative 26 %   Lymphs Abs 1.7 0.7 - 4.0 K/uL   Monocytes Relative 7 %   Monocytes Absolute 0.5 0.1 - 1.0 K/uL   Eosinophils Relative 6 %   Eosinophils Absolute 0.4 0.0 - 0.7 K/uL   Basophils Relative 0 %   Basophils Absolute 0.0 0.0 - 0.1 K/uL  Basic metabolic panel     Status: Abnormal   Collection Time: 08/04/17  4:40 AM  Result Value Ref Range   Sodium 139 135 - 145 mmol/L   Potassium 4.4 3.5 - 5.1 mmol/L   Chloride 106 101 - 111 mmol/L   CO2 25 22 - 32 mmol/L   Glucose, Bld 147 (H) 65 - 99 mg/dL   BUN 16 6 - 20 mg/dL   Creatinine, Ser 0.73 0.44 - 1.00 mg/dL   Calcium 8.9 8.9 - 10.3 mg/dL   GFR calc non Af Amer >60 >60 mL/min   GFR calc Af Amer >60 >60 mL/min  Comment: (NOTE) The eGFR has been calculated using the CKD EPI equation. This calculation has not been validated in all clinical situations. eGFR's persistently <60 mL/min signify possible Chronic Kidney Disease.    Anion gap 8 5 - 15  Magnesium     Status: None   Collection Time: 08/04/17  4:40 AM  Result Value Ref Range   Magnesium 2.0 1.7 - 2.4 mg/dL  Glucose, capillary     Status: Abnormal   Collection Time: 08/04/17  6:04 AM  Result Value Ref Range   Glucose-Capillary 148 (H) 65 - 99 mg/dL  Glucose, capillary     Status: Abnormal   Collection Time: 08/04/17 11:33 AM  Result Value Ref Range   Glucose-Capillary 155 (H) 65 - 99 mg/dL  Glucose, capillary     Status: Abnormal   Collection Time: 08/04/17  5:18 PM  Result Value Ref Range   Glucose-Capillary 136 (H) 65 - 99 mg/dL  Glucose, capillary     Status: Abnormal   Collection Time: 08/04/17  9:41 PM  Result Value Ref Range   Glucose-Capillary 140 (H) 65 - 99 mg/dL     HEENT: Normocephalic. Atraumatic. Cardio: RRR and no JVD Resp: CTA B/L and unlabored GI: BS positive and ND Skin:   Intact. Warm and dry. Neuro:  Alert/Oriented Motor 5/5 in BUE RLE: 3-/5 HF, KE, ADF (partly limited by pain inhibition and spasms, unchanged) 4-/5 Left HF, KE, ADF (partly limited by pain inhibition and spasms) Musc/Skel:  No edema. No tenderness. Gen NAD. Vital signs reviewed.    Assessment/Plan: 1. 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 Physiatrist is providing close team supervision and 24 hour management of active medical problems listed below. Physiatrist and rehab team continue to assess barriers to discharge/monitor patient progress toward functional and medical goals. FIM: Function - Bathing Position: Bed Body parts bathed by patient: Right arm, Left arm, Chest, Abdomen, Right upper leg, Left upper leg Body parts bathed by helper: Right lower leg, Left lower leg Bathing not applicable: Buttocks, Front perineal area Assist Level: Touching or steadying assistance(Pt > 75%)  Function- Upper Body Dressing/Undressing What is the patient wearing?: Hospital gown Bra - Perfomed by patient: Thread/unthread right bra strap, Thread/unthread left bra strap Bra - Perfomed by helper: Hook/unhook bra (pull down sports bra) Button up shirt - Perfomed by patient: Thread/unthread right sleeve, Pull shirt around back, Button/unbutton shirt, Thread/unthread left sleeve Orthosis activity level: Performed by patient Assist Level: Set up Set up : To obtain clothing/put away Function - Lower Body Dressing/Undressing What is the patient wearing?: Non-skid slipper socks Pants- Performed by patient: Thread/unthread right pants leg, Thread/unthread left pants leg Pants- Performed by helper: Pull pants up/down Non-skid slipper socks- Performed by helper: Don/doff left sock, Don/doff right sock Socks - Performed by helper: Don/doff right sock, Don/doff left sock Assist for footwear: Dependant Assist for lower body dressing:  Touching or steadying assistance (Pt > 75%)(2/5, 40%, max assist)  Function - Toileting Toileting activity did not occur: No continent bowel/bladder event Toileting steps completed by helper: Adjust clothing prior to toileting, Performs perineal hygiene, Adjust clothing after toileting Assist level: Two helpers  Function - Air cabin crew transfer activity did not occur: Refused  Function - Chair/bed transfer Chair/bed transfer method: Other Chair/bed transfer assist level: dependent (Pt equals 0%) Chair/bed transfer assistive device: Mechanical lift Mechanical lift: Stedy Chair/bed transfer details: Verbal cues for precautions/safety, Verbal cues for  sequencing, Verbal cues for safe use of DME/AE, Verbal cues for technique, Manual facilitation for weight shifting  Function - Locomotion: Wheelchair Will patient use wheelchair at discharge?: Yes Type: Manual Max wheelchair distance: 200 Assist Level: Supervision or verbal cues Assist Level: Supervision or verbal cues Assist Level: Supervision or verbal cues Function - Locomotion: Ambulation Ambulation activity did not occur: Safety/medical concerns Walk 10 feet on uneven surfaces activity did not occur: Safety/medical concerns  Function - Comprehension Comprehension: Auditory Comprehension assist level: Follows complex conversation/direction with extra time/assistive device  Function - Expression Expression: Verbal Expression assist level: Expresses complex ideas: With no assist  Function - Social Interaction Social Interaction assist level: Interacts appropriately with others with medication or extra time (anti-anxiety, antidepressant).  Function - Problem Solving Problem solving assist level: Solves basic problems with no assist  Function - Memory Memory assist level: Complete Independence: No helper Patient normally able to recall (first 3 days only): Current season, Location of own room, Staff names and faces,  That he or she is in a hospital  Medical Problem List and Plan: 1.Decreased functional mobilitysecondary to lumbar spondylosis with radiculopathy. Status post L5-S1 anterior retroperitoneal exposure with laminectomy and fusion 07/26/2017 as well as history of multiple thoracic lumbar surgeries. Back brace when out of bed   Cont CIR  2. DVT Prophylaxis/Anticoagulation: SCDs.    Vascular study negative for DVT.  3. Pain Management:Celebrex 200 mg every 12 hours, Neurontin 300 mg 3 times a day, hydrocodone as needed  Baclofen changed back to Robaxin 1000 mg 4 times a day. She notes she has some improvement with this.   Tizanidine qhs d/ced on 11/23 due to ?side effects.  4. Mood:Provide emotional support 5. Neuropsych: This patientiscapable of making decisions on herown behalf. 6. Skin/Wound Care:Routine skin checks 7. Fluids/Electrolytes/Nutrition:Routine I&O's   BMP within acceptable range on 11/22 8.Acute blood loss anemia.   Hb 9.6 on 11/22  Continue to monitor  9.Hypertension. Lisinopril 10 mg daily   Increased to 53m 11/20  Overall controlled on 11/23 Vitals:   08/04/17 1536 08/05/17 0559  BP: 130/67 (!) 143/64  Pulse: 75 70  Resp: 16 17  Temp: 98.5 F (36.9 C) 97.7 F (36.5 C)  SpO2: 100% 98%   10.Diabetes mellitus peripheral neuropathy. Hemoglobin A1c 6.7. Glucophage 500 mg twice a day. Check blood sugars before meals and at bedtime CBG (last 3)  Recent Labs    08/04/17 1133 08/04/17 1718 08/04/17 2141  GLUCAP 155* 136* 140*     Overall controlled 11/23 11.Constipation. Laxative assistance, increased on 11/18, increased again on 11/21. Bowel program  12.Remote tobacco abuse. Counseling 13. Hypoalbuminemia  Supplement initiated on 11/17  LOS (Days) 8 A FACE TO FACE EVALUATION WAS PERFORMED  Tomislav Micale ALorie Phenix11/23/2018, 7:41 AM

## 2017-08-05 NOTE — Progress Notes (Signed)
Occupational Therapy Session Note  Patient Details  Name: Megan [name] MRN: [id number] Date of Birth: 04/22/1951  Today's Date: 08/05/2017 OT Individual Time: 3007-6226 OT Individual Time Calculation (min): 30 min   Skilled Therapeutic Interventions/Progress Updates:    Tx focus on adaptive dressing skills, standing balance, and adherence to precautions during self care tasks.   Pt greeted supine in bed. Family present. She reported feeling better today, min c/o leg spasms. Requested to doff clothing in prep for enema. Pt reported that moving LEs in supine was causing some back discomfort. Educated her on use of leg lifter for achieving figure 4 in pain free regions. Mod cues for maintaining 90 degrees hip flexion while guiding each LE up to doff/don socks. Mod A for supine<sit via logroll technique. Pt donning her lumbar corset with setup, then OT donned supportive shoes with Rt AFO component. Sit<stand from elevated bed with Max A and RW. Manual facilitation provided at Rt knee due to buckling. Pt standing with bilateral UE support while OT lowered pants. She removed lumbar brace and shirt in sitting. Returned to supine with Mod A once donning clean hospital gown. Pt was repositioned for comfort and left with all needs within reach at session exit.   RN notified pt was ready for enema.   Therapy Documentation Precautions:  Precautions Precautions: Back, Fall Precaution Booklet Issued: Yes (comment) Precaution Comments: pt able to 3/3 back precautions Required Braces or Orthoses: Spinal Brace, Other Brace/Splint Spinal Brace: Lumbar corset, Applied in sitting position Other Brace/Splint: AFO RLE Restrictions Weight Bearing Restrictions: No Vital Signs: Therapy Vitals Temp: 98.6 F (37 C) Temp Source: Oral Pulse Rate: 73 Resp: 18 BP: (!) 124/52 Patient Position (if appropriate): Lying Oxygen Therapy SpO2: 100 % O2 Device: Not Delivered Pain: Pain Assessment Pain  Assessment: 0-10 Pain Score: 1  Faces Pain Scale: Hurts a little bit Pain Type: Surgical pain ADL:      See Function Navigator for Current Functional Status.   Therapy/Group: Individual Therapy  Freeland Pracht A Jeramiah Mccaughey 08/05/2017, 4:11 PM

## 2017-08-05 NOTE — Progress Notes (Signed)
Occupational Therapy Session Note  Patient Details  Name: Megan [name] MRN: [id number] Date of Birth: [birth year]  Today's Date: 08/05/2017 OT Individual Time: 1015-1130 OT Individual Time Calculation (min): 75 min   Short Term Goals: Week 1:  OT Short Term Goal 1 (Week 1): Pt will sit to stand with MOD A of 1 wiht RW to decrease burden of care OT Short Term Goal 2 (Week 1): Pt will stand pivot transfer to [facility] with MAX A 1 caregiver OT Short Term Goal 3 (Week 1): Pt will don footwear with AE PRN OT Short Term Goal 4 (Week 1): Pt will bathe LB wiht MIN A PRN  Skilled Therapeutic Interventions/Progress Updates:   Pt greeted seated in wc and agreeable to OT treatment session focused on self-care, activity tolerance, and UB the-ex. Pt reports much better pain and muscle spasm management and eager to progress with therapy.Pt request to wash her hair and was able to propel wc to the sink and back it up for hair washing task. Pt able to bring B UEs to head to assist with washing and drying hair, pt required assist to rinse 2/2 positioning. B UE strength/coordination with wc propulsion and there-ex using orange thera-band. 15x2  sets triceps press, bicep curl, and chest press with rest break between sets. Pt propelled wc back to room, positioned wc at EOB with assist to remove leg rests, then was able to squat-pivot to R side with min A. Assistance required to lift B LE's back in to bed and pt left semi-reclined with Towel roll on R hip to maintain neutral hip/knee, and pillow under L knee for comfort. Call bell in reach and needs met.   Therapy Documentation Precautions:  Precautions Precautions: Back, Fall Precaution Booklet Issued: Yes (comment) Precaution Comments: pt able to 3/3 back precautions Required Braces or Orthoses: Spinal Brace, Other Brace/Splint Spinal Brace: Lumbar corset, Applied in sitting position Other Brace/Splint: AFO RLE Restrictions Weight Bearing Restrictions:  No Pain: Pain Assessment Pain Assessment: 0-10 Pain Score: 1  Pain Type: Acute pain Pain Location: Leg Pain Orientation: Right Pain Descriptors / Indicators: Spasm Pain Onset: Sudden Patients Stated Pain Goal: 0 Pain Intervention(s): Repositioned  See Function Navigator for Current Functional Status.   Therapy/Group: Individual Therapy  [name] 08/05/2017, 11:26 AM

## 2017-08-06 ENCOUNTER — Inpatient Hospital Stay (HOSPITAL_COMMUNITY): Payer: BLUE CROSS/BLUE SHIELD | Admitting: Occupational Therapy

## 2017-08-06 DIAGNOSIS — M5416 Radiculopathy, lumbar region: Secondary | ICD-10-CM

## 2017-08-06 DIAGNOSIS — N390 Urinary tract infection, site not specified: Secondary | ICD-10-CM

## 2017-08-06 LAB — GLUCOSE, CAPILLARY
GLUCOSE-CAPILLARY: 146 mg/dL — AB (ref 65–99)
GLUCOSE-CAPILLARY: 139 mg/dL — AB (ref 65–99)
Glucose-Capillary: 115 mg/dL — ABNORMAL HIGH (ref 65–99)
Glucose-Capillary: 110 mg/dL — ABNORMAL HIGH (ref 65–99)

## 2017-08-06 LAB — URINALYSIS, COMPLETE (UACMP) WITH MICROSCOPIC
Bilirubin Urine: NEGATIVE
GLUCOSE, UA: NEGATIVE mg/dL
HGB URINE DIPSTICK: NEGATIVE
Ketones, ur: NEGATIVE mg/dL
NITRITE: NEGATIVE
PH: 5 (ref 5.0–8.0)
Protein, ur: NEGATIVE mg/dL
SPECIFIC GRAVITY, URINE: 1.012 (ref 1.005–1.030)

## 2017-08-06 MED ORDER — NITROFURANTOIN MONOHYD MACRO 100 MG PO CAPS
100.0000 mg | ORAL_CAPSULE | Freq: Two times a day (BID) | ORAL | Status: DC
  Administered 2017-08-06 – 2017-08-10 (×9): 100 mg via ORAL
  Filled 2017-08-06 (×9): qty 1

## 2017-08-06 NOTE — Progress Notes (Signed)
Occupational Therapy Session Note  Patient Details  Name: Megan [name] MRN: [id number] Date of Birth: 1951-01-25  Today's Date: 08/06/2017 OT Individual Time:  -  1300-1400  (60 min)      Short Term Goals: Week 1:  OT Short Term Goal 1 (Week 1): Pt will sit to stand with MOD A of 1 wiht RW to decrease burden of care OT Short Term Goal 2 (Week 1): Pt will stand pivot transfer to [facility] with MAX A 1 caregiver OT Short Term Goal 3 (Week 1): Pt will don footwear with AE PRN OT Short Term Goal 4 (Week 1): Pt will bathe LB wiht MIN A PRN         Skilled Therapeutic Interventions/Progress Updates:    Pt stated her Bowels were loose and did not want to get OOB .  Performed UE therex with organge theraband.    Did sho raises , abduction; Perfomed LE with AAROM for hip adduction with bent knees; straight leg out and bent knees.  Educated on ankle ROM with eversion and inversion.   PPt left in bed with all needs in reach.    Therapy Documentation Precautions:  Precautions Precautions: Back, Fall Precaution Booklet Issued: Yes (comment) Precaution Comments: pt able to 3/3 back precautions Required Braces or Orthoses: Spinal Brace, Other Brace/Splint Spinal Brace: Lumbar corset, Applied in sitting position Other Brace/Splint: AFO RLE Restrictions Weight Bearing Restrictions: No     Pain: Pain Assessment Pain Score: 4        See Function Navigator for Current Functional Status.   Therapy/Group: Individual Therapy  Lisa Roca 08/06/2017, 2:00 PM

## 2017-08-06 NOTE — Progress Notes (Signed)
Subjective/Complaints: Notes some Burning related to her urine as well as odor and cloudiness.  Otherwise feels fairly well  ROS: pt denies nausea, vomiting, diarrhea, cough, shortness of breath or chest pain  Objective: Vital Signs: Blood pressure (!) 142/64, pulse 73, temperature 97.8 F (36.6 C), temperature source Oral, resp. rate 14, height 5' 8"  (1.727 m), weight 87 kg (191 lb 14.4 oz), SpO2 97 %. No results found. Results for orders placed or performed during the hospital encounter of 07/28/17 (from the past 72 hour(s))  Glucose, capillary     Status: Abnormal   Collection Time: 08/03/17 11:43 AM  Result Value Ref Range   Glucose-Capillary 117 (H) 65 - 99 mg/dL   Comment 1 Notify RN   Glucose, capillary     Status: Abnormal   Collection Time: 08/03/17  5:07 PM  Result Value Ref Range   Glucose-Capillary 143 (H) 65 - 99 mg/dL  Glucose, capillary     Status: Abnormal   Collection Time: 08/03/17  9:23 PM  Result Value Ref Range   Glucose-Capillary 196 (H) 65 - 99 mg/dL  CBC with Differential/Platelet     Status: Abnormal   Collection Time: 08/04/17  4:40 AM  Result Value Ref Range   WBC 6.4 4.0 - 10.5 K/uL   RBC 3.33 (L) 3.87 - 5.11 MIL/uL   Hemoglobin 9.6 (L) 12.0 - 15.0 g/dL   HCT 29.6 (L) 36.0 - 46.0 %   MCV 88.9 78.0 - 100.0 fL   MCH 28.8 26.0 - 34.0 pg   MCHC 32.4 30.0 - 36.0 g/dL   RDW 13.2 11.5 - 15.5 %   Platelets 332 150 - 400 K/uL   Neutrophils Relative % 61 %   Neutro Abs 3.9 1.7 - 7.7 K/uL   Lymphocytes Relative 26 %   Lymphs Abs 1.7 0.7 - 4.0 K/uL   Monocytes Relative 7 %   Monocytes Absolute 0.5 0.1 - 1.0 K/uL   Eosinophils Relative 6 %   Eosinophils Absolute 0.4 0.0 - 0.7 K/uL   Basophils Relative 0 %   Basophils Absolute 0.0 0.0 - 0.1 K/uL  Basic metabolic panel     Status: Abnormal   Collection Time: 08/04/17  4:40 AM  Result Value Ref Range   Sodium 139 135 - 145 mmol/L   Potassium 4.4 3.5 - 5.1 mmol/L   Chloride 106 101 - 111 mmol/L   CO2 25  22 - 32 mmol/L   Glucose, Bld 147 (H) 65 - 99 mg/dL   BUN 16 6 - 20 mg/dL   Creatinine, Ser 0.73 0.44 - 1.00 mg/dL   Calcium 8.9 8.9 - 10.3 mg/dL   GFR calc non Af Amer >60 >60 mL/min   GFR calc Af Amer >60 >60 mL/min    Comment: (NOTE) The eGFR has been calculated using the CKD EPI equation. This calculation has not been validated in all clinical situations. eGFR's persistently <60 mL/min signify possible Chronic Kidney Disease.    Anion gap 8 5 - 15  Magnesium     Status: None   Collection Time: 08/04/17  4:40 AM  Result Value Ref Range   Magnesium 2.0 1.7 - 2.4 mg/dL  Glucose, capillary     Status: Abnormal   Collection Time: 08/04/17  6:04 AM  Result Value Ref Range   Glucose-Capillary 148 (H) 65 - 99 mg/dL  Glucose, capillary     Status: Abnormal   Collection Time: 08/04/17 11:33 AM  Result Value Ref Range   Glucose-Capillary 155 (  H) 65 - 99 mg/dL  Glucose, capillary     Status: Abnormal   Collection Time: 08/04/17  5:18 PM  Result Value Ref Range   Glucose-Capillary 136 (H) 65 - 99 mg/dL  Glucose, capillary     Status: Abnormal   Collection Time: 08/04/17  9:41 PM  Result Value Ref Range   Glucose-Capillary 140 (H) 65 - 99 mg/dL  Glucose, capillary     Status: Abnormal   Collection Time: 08/05/17 11:30 AM  Result Value Ref Range   Glucose-Capillary 158 (H) 65 - 99 mg/dL  Glucose, capillary     Status: Abnormal   Collection Time: 08/05/17  5:16 PM  Result Value Ref Range   Glucose-Capillary 121 (H) 65 - 99 mg/dL  Glucose, capillary     Status: Abnormal   Collection Time: 08/05/17 10:05 PM  Result Value Ref Range   Glucose-Capillary 184 (H) 65 - 99 mg/dL  Glucose, capillary     Status: Abnormal   Collection Time: 08/06/17  6:24 AM  Result Value Ref Range   Glucose-Capillary 146 (H) 65 - 99 mg/dL  Urinalysis, Complete w Microscopic     Status: Abnormal   Collection Time: 08/06/17  9:41 AM  Result Value Ref Range   Color, Urine YELLOW YELLOW   APPearance HAZY  (A) CLEAR   Specific Gravity, Urine 1.012 1.005 - 1.030   pH 5.0 5.0 - 8.0   Glucose, UA NEGATIVE NEGATIVE mg/dL   Hgb urine dipstick NEGATIVE NEGATIVE   Bilirubin Urine NEGATIVE NEGATIVE   Ketones, ur NEGATIVE NEGATIVE mg/dL   Protein, ur NEGATIVE NEGATIVE mg/dL   Nitrite NEGATIVE NEGATIVE   Leukocytes, UA SMALL (A) NEGATIVE   RBC / HPF 0-5 0 - 5 RBC/hpf   WBC, UA 6-30 0 - 5 WBC/hpf   Bacteria, UA MANY (A) NONE SEEN   Squamous Epithelial / LPF 0-5 (A) NONE SEEN   Mucus PRESENT      HEENT: Normocephalic. Atraumatic. Cardio: RRR without murmur. No JVD  Resp: CTA B/L and unlabored GI: BS positive and ND Skin:   Intact. Warm and dry. Urology: Urine slightly cloudy and yellow Neuro: Alert/Oriented Motor 5/5 in BUE RLE: 3-/5 HF, KE, ADF (partly limited by pain inhibition and spasms, unchanged) 4-/5 Left HF, KE, ADF (partly limited by pain inhibition and spasms) Musc/Skel:  No edema. No tenderness. Gen NAD. Vital signs reviewed.    Assessment/Plan: 1. 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 Physiatrist is providing close team supervision and 24 hour management of active medical problems listed below. Physiatrist and rehab team continue to assess barriers to discharge/monitor patient progress toward functional and medical goals. FIM: Function - Bathing Position: Bed Body parts bathed by patient: Right arm, Left arm, Chest, Abdomen, Right upper leg, Left upper leg Body parts bathed by helper: Right lower leg, Left lower leg Bathing not applicable: Buttocks, Front perineal area Assist Level: Touching or steadying assistance(Pt > 75%)  Function- Upper Body Dressing/Undressing What is the patient wearing?: Hospital gown Bra - Perfomed by patient: Thread/unthread right bra strap, Thread/unthread left bra strap Bra - Perfomed by helper: Hook/unhook bra (pull down sports  bra) Button up shirt - Perfomed by patient: Thread/unthread right sleeve, Pull shirt around back, Button/unbutton shirt, Thread/unthread left sleeve Orthosis activity level: Performed by patient Assist Level: Set up Set up : To obtain clothing/put away Function - Lower Body Dressing/Undressing What is the patient wearing?: Pants,  Socks, Shoes, AFO Pants- Performed by patient: Thread/unthread right pants leg Pants- Performed by helper: Thread/unthread left pants leg, Pull pants up/down Non-skid slipper socks- Performed by helper: Don/doff left sock, Don/doff right sock Socks - Performed by helper: Don/doff right sock, Don/doff left sock Shoes - Performed by helper: Don/doff right shoe, Don/doff left shoe, Fasten right, Fasten left AFO - Performed by helper: Don/doff left AFO Assist for footwear: Maximal assist Assist for lower body dressing: Touching or steadying assistance (Pt > 75%)  Function - Toileting Toileting activity did not occur: No continent bowel/bladder event Toileting steps completed by helper: Adjust clothing prior to toileting, Performs perineal hygiene, Adjust clothing after toileting Assist level: Two helpers  Function - Air cabin crew transfer activity did not occur: Refused  Function - Chair/bed transfer Chair/bed transfer method: Squat pivot Chair/bed transfer assist level: Touching or steadying assistance (Pt > 75%) Chair/bed transfer assistive device: Armrests Mechanical lift: Stedy Chair/bed transfer details: Verbal cues for precautions/safety, Verbal cues for sequencing, Verbal cues for safe use of DME/AE, Verbal cues for technique, Manual facilitation for weight shifting  Function - Locomotion: Wheelchair Will patient use wheelchair at discharge?: Yes Type: Manual Max wheelchair distance: 100 ft Assist Level: Supervision or verbal cues Assist Level: Supervision or verbal cues Assist Level: Supervision or verbal cues Function - Locomotion:  Ambulation Ambulation activity did not occur: Safety/medical concerns Walk 10 feet on uneven surfaces activity did not occur: Safety/medical concerns  Function - Comprehension Comprehension: Auditory Comprehension assist level: Follows complex conversation/direction with extra time/assistive device  Function - Expression Expression: Verbal Expression assist level: Expresses complex ideas: With no assist  Function - Social Interaction Social Interaction assist level: Interacts appropriately with others with medication or extra time (anti-anxiety, antidepressant).  Function - Problem Solving Problem solving assist level: Solves basic problems with no assist  Function - Memory Memory assist level: Complete Independence: No helper Patient normally able to recall (first 3 days only): Current season, Location of own room, Staff names and faces, That he or she is in a hospital  Medical Problem List and Plan: 1.Decreased functional mobilitysecondary to lumbar spondylosis with radiculopathy. Status post L5-S1 anterior retroperitoneal exposure with laminectomy and fusion 07/26/2017 as well as history of multiple thoracic lumbar surgeries. Back brace when out of bed   Cont CIR  2. DVT Prophylaxis/Anticoagulation: SCDs.    Vascular study negative for DVT.  3. Pain Management:Celebrex 200 mg every 12 hours, Neurontin 300 mg 3 times a day, hydrocodone as needed  Baclofen changed back to Robaxin 1000 mg 4 times a day. She notes she has some improvement with this.   Tizanidine qhs d/ced on 11/23 due to ?side effects.  4. Mood:Provide emotional support 5. Neuropsych: This patientiscapable of making decisions on herown behalf. 6. Skin/Wound Care:Routine skin checks 7. Fluids/Electrolytes/Nutrition:Routine I&O's   BMP within acceptable range on 11/22 8.Acute blood loss anemia.   Hb 9.6 on 11/22  Continue to monitor  9.Hypertension. Lisinopril 10 mg daily   Increased to 79m  11/20  Reasonable control as of 11/24 Vitals:   08/05/17 1353 08/06/17 0630  BP: (!) 124/52 (!) 142/64  Pulse: 73 73  Resp: 18 14  Temp: 98.6 F (37 C) 97.8 F (36.6 C)  SpO2: 100% 97%   10.Diabetes mellitus peripheral neuropathy. Hemoglobin A1c 6.7. Glucophage 500 mg twice a day. Check blood sugars before meals and at bedtime CBG (last 3)  Recent Labs    08/05/17 1716 08/05/17 2205 08/06/17 0624  GLUCAP 121* 184*  146*     Overall controlled 11/24 11.Constipation. Laxative assistance, increased on 11/18, increased again on 11/21. Bowel program  12.Remote tobacco abuse. Counseling 13. Hypoalbuminemia  Supplement initiated on 11/17 14.  Dysuria: Patient also with noted odor as well as cloudiness.  Check urinalysis and culture today  LOS (Days) 9 A FACE TO FACE EVALUATION WAS PERFORMED  Hyacinth Marcelli T 08/06/2017, 11:17 AM

## 2017-08-07 ENCOUNTER — Inpatient Hospital Stay (HOSPITAL_COMMUNITY): Payer: BLUE CROSS/BLUE SHIELD | Admitting: Physical Therapy

## 2017-08-07 ENCOUNTER — Inpatient Hospital Stay (HOSPITAL_COMMUNITY): Payer: BLUE CROSS/BLUE SHIELD | Admitting: Occupational Therapy

## 2017-08-07 ENCOUNTER — Inpatient Hospital Stay (HOSPITAL_COMMUNITY): Payer: BLUE CROSS/BLUE SHIELD

## 2017-08-07 LAB — GLUCOSE, CAPILLARY
GLUCOSE-CAPILLARY: 144 mg/dL — AB (ref 65–99)
Glucose-Capillary: 137 mg/dL — ABNORMAL HIGH (ref 65–99)
Glucose-Capillary: 121 mg/dL — ABNORMAL HIGH (ref 65–99)
Glucose-Capillary: 123 mg/dL — ABNORMAL HIGH (ref 65–99)

## 2017-08-07 LAB — URINE CULTURE

## 2017-08-07 NOTE — Progress Notes (Signed)
Physical Therapy Session Note  Patient Details  Name: Megan [name] MRN: [id number] Date of Birth: 1951/05/20  Today's Date: 08/07/2017 PT Individual Time: 1100-1200 PT Individual Time Calculation (min): 60 min   Short Term Goals: Week 1:  PT Short Term Goal 1 (Week 1): Pt will tranfser with +1 assist without lift equipment PT Short Term Goal 2 (Week 1): Pt will initiate gait training with LRAD PT Short Term Goal 3 (Week 1): Pt will tolerate 1 minute standing with BUE support x2 minutes  Skilled Therapeutic Interventions/Progress Updates:   Pt supine upon arrival and agreeable to therapy, no c/o pain at beginning of session. Worked on tolerance to OOB activity this session and endurance w/ functional mobility. Transferred to EOB w/ Min A, Total A to don shoes, and brace w/ supervision. Transferred to w/c via stand pivot w/ Mod A for boosting to stand and to block R knee 2/2 mild buckling in WB. Pt self-propelled w/c around unit in 100-150' bouts during session, supervision using BUEs. Worked on sit<>stands in parallel bars from w/c w/ Mod-Max A for boosting into stance and manual facilitation at R knee for blocking during transfer. Min guard to remain standing w/ UE support on parallel bars for 10-20 seconds each time. Performed 5 sit<>stands in total. Pt reporting increased (7/10) back pain after multiple stands, requesting pain medication - RN made aware and present to provide medication. Continued w/ w/c level activity 2/2 pain, performed kinetron 10 min @ lowest level for LE strengthening, frequent rest breaks for 5-10 sec at a time 2/2 tingling in LEs. Returned to room in w/c w/ supervision and ended session in w/c and in care of husband, all needs met.   Therapy Documentation Precautions:  Precautions Precautions: Back, Fall Precaution Booklet Issued: Yes (comment) Precaution Comments: pt able to 3/3 back precautions Required Braces or Orthoses: Spinal Brace, Other  Brace/Splint Spinal Brace: Lumbar corset, Applied in sitting position Other Brace/Splint: AFO RLE Restrictions Weight Bearing Restrictions: No Pain: Pain Assessment Pain Assessment: 0-10 Pain Score: 7  Pain Type: Acute pain Pain Location: Back Pain Orientation: Left;Lower Pain Descriptors / Indicators: Aching Pain Frequency: Intermittent Pain Onset: On-going Patients Stated Pain Goal: 0 Pain Intervention(s): Medication (See eMAR)  See Function Navigator for Current Functional Status.   Therapy/Group: Individual Therapy  Ranyah Groeneveld K Arnette 08/07/2017, 12:06 PM

## 2017-08-07 NOTE — Progress Notes (Signed)
Physical Therapy Session Note  Patient Details  Name: Megan [name] MRN: [id number] Date of Birth: 1950/12/01  Today's Date: 08/07/2017 PT Individual Time: 1600-1700 PT Individual Time Calculation (min): 60 min   Short Term Goals: Week 1:  PT Short Term Goal 1 (Week 1): Pt will tranfser with +1 assist without lift equipment PT Short Term Goal 2 (Week 1): Pt will initiate gait training with LRAD PT Short Term Goal 3 (Week 1): Pt will tolerate 1 minute standing with BUE support x2 minutes  Skilled Therapeutic Interventions/Progress Updates:    Pt supine in bed upon PT arrival, agreeable to therapy tx and reports pain 6/10 in low back. Pt transferred supine>sitting with mod assist to lift LEs. Therapist donned pts shoes total assist. Pt transferred bed<>w/c squat pivot with min assist. Pt propelled w/c to the gym with supervision using B UEs x 100 ft each direction. Session focused on sit<>stands, pre-gait and gait within the parallel bars. Pt performed sit<>stands x 6 this session with mod-max assist to boost up to standing. Once in standing pt worked on mini squats for knee control, stepping forward/backwards in place, lateral weightshifting and symmetric weightbearing. Pt ambulated x 6 ft in the parallel bars with mod assist and facilitation for B hip extension, R knee block to prevent buckling, and a w/c follow for safety. Pt transferred w/c>bed squat pivot min assist. Pt performed sit<>stand from bed with RW to doff pants, able to take single UE off RW to help. Pt transferred sitting>supine with mod assist to lift LEs, left supine in bed with needs in reach.   Therapy Documentation Precautions:  Precautions Precautions: Back, Fall Precaution Booklet Issued: Yes (comment) Precaution Comments: pt able to 3/3 back precautions Required Braces or Orthoses: Spinal Brace, Other Brace/Splint Spinal Brace: Lumbar corset, Applied in sitting position Other Brace/Splint: AFO  RLE Restrictions Weight Bearing Restrictions: No   See Function Navigator for Current Functional Status.   Therapy/Group: Individual Therapy  Netta Corrigan, PT, DPT 08/07/2017, 4:52 PM

## 2017-08-07 NOTE — Progress Notes (Signed)
Occupational Therapy Session Note  Patient Details  Name: Megan [name] MRN: [id number] Date of Birth: Aug 22, 1951  Today's Date: 08/07/2017 OT Individual Time: 0100-7121 OT Individual Time Calculation (min): 55 min    Short Term Goals: Week 1:  OT Short Term Goal 1 (Week 1): Pt will sit to stand with MOD A of 1 wiht RW to decrease burden of care OT Short Term Goal 2 (Week 1): Pt will stand pivot transfer to [facility] with MAX A 1 caregiver OT Short Term Goal 3 (Week 1): Pt will don footwear with AE PRN OT Short Term Goal 4 (Week 1): Pt will bathe LB wiht MIN A PRN  Skilled Therapeutic Interventions/Progress Updates:    Tx focus on adaptive bathing/dressing skills, activity tolerance, and adherence to back precautions during self care tasks.   Pt greeted supine in bed, requesting to bathe. Pt transitioning to EOB with Mod A for elevating trunk via logroll technique. She completed bathing (excluding periarea) while seated with use of LH sponge and cues for using reacher adaptively for drying off. Setup for UB dressing and donning brace. Setup for oral care. Pt with intermittent LE spasms and Rt upper thigh pain while moving LEs functionally, therefore required increased time for all tasks. Pt using reacher for threading LEs into pants. Educated her on use of sock aide for donning footwear. She required assist for elevating Rt foot into device due to limited ROM/pain. Pt opting to elevate pants over hips while rolling supine in bed vs standing due to pain. Pt repositioned for comfort and was left with all needs within reach.       Therapy Documentation Precautions:  Precautions Precautions: Back, Fall Precaution Booklet Issued: Yes (comment) Precaution Comments: pt able to 3/3 back precautions Required Braces or Orthoses: Spinal Brace, Other Brace/Splint Spinal Brace: Lumbar corset, Applied in sitting position Other Brace/Splint: AFO RLE Restrictions Weight Bearing Restrictions: No  Pain:  Per RN, pt was not due for pain medication during session. Educated her on diaphragmatic breathing.  Pain Assessment Pain Assessment: 0-10 Pain Score: 3  Pain Type: Acute pain Pain Location: Back Pain Orientation: Left;Lower Pain Descriptors / Indicators: Aching Pain Frequency: Intermittent Pain Onset: On-going Patients Stated Pain Goal: 0 Pain Intervention(s): Medication (See eMAR) ADL:     See Function Navigator for Current Functional Status.   Therapy/Group: Individual Therapy  Raneem Mendolia A Raiven Belizaire 08/07/2017, 12:47 PM

## 2017-08-07 NOTE — Progress Notes (Signed)
Subjective/Complaints: Had multiple loose stools after receiving bowel medications yesterday.  Also having some spasms in her legs especially at nighttime  ROS: pt denies nausea, vomiting, diarrhea, cough, shortness of breath or chest pain   Objective: Vital Signs: Blood pressure (!) 165/77, pulse 74, temperature 97.8 F (36.6 C), temperature source Oral, resp. rate 18, height 5\' 8"  (1.727 m), weight 87 kg (191 lb 14.4 oz), SpO2 97 %. No results found. Results for orders placed or performed during the hospital encounter of 07/28/17 (from the past 72 hour(s))  Glucose, capillary     Status: Abnormal   Collection Time: 08/04/17 11:33 AM  Result Value Ref Range   Glucose-Capillary 155 (H) 65 - 99 mg/dL  Glucose, capillary     Status: Abnormal   Collection Time: 08/04/17  5:18 PM  Result Value Ref Range   Glucose-Capillary 136 (H) 65 - 99 mg/dL  Glucose, capillary     Status: Abnormal   Collection Time: 08/04/17  9:41 PM  Result Value Ref Range   Glucose-Capillary 140 (H) 65 - 99 mg/dL  Glucose, capillary     Status: Abnormal   Collection Time: 08/05/17 11:30 AM  Result Value Ref Range   Glucose-Capillary 158 (H) 65 - 99 mg/dL  Glucose, capillary     Status: Abnormal   Collection Time: 08/05/17  5:16 PM  Result Value Ref Range   Glucose-Capillary 121 (H) 65 - 99 mg/dL  Glucose, capillary     Status: Abnormal   Collection Time: 08/05/17 10:05 PM  Result Value Ref Range   Glucose-Capillary 184 (H) 65 - 99 mg/dL  Glucose, capillary     Status: Abnormal   Collection Time: 08/06/17  6:24 AM  Result Value Ref Range   Glucose-Capillary 146 (H) 65 - 99 mg/dL  Urinalysis, Complete w Microscopic     Status: Abnormal   Collection Time: 08/06/17  9:41 AM  Result Value Ref Range   Color, Urine YELLOW YELLOW   APPearance HAZY (A) CLEAR   Specific Gravity, Urine 1.012 1.005 - 1.030   pH 5.0 5.0 - 8.0   Glucose, UA NEGATIVE NEGATIVE mg/dL   Hgb urine dipstick NEGATIVE NEGATIVE    Bilirubin Urine NEGATIVE NEGATIVE   Ketones, ur NEGATIVE NEGATIVE mg/dL   Protein, ur NEGATIVE NEGATIVE mg/dL   Nitrite NEGATIVE NEGATIVE   Leukocytes, UA SMALL (A) NEGATIVE   RBC / HPF 0-5 0 - 5 RBC/hpf   WBC, UA 6-30 0 - 5 WBC/hpf   Bacteria, UA MANY (A) NONE SEEN   Squamous Epithelial / LPF 0-5 (A) NONE SEEN   Mucus PRESENT   Glucose, capillary     Status: Abnormal   Collection Time: 08/06/17 12:31 PM  Result Value Ref Range   Glucose-Capillary 115 (H) 65 - 99 mg/dL   Comment 1 Notify RN   Glucose, capillary     Status: Abnormal   Collection Time: 08/06/17  4:58 PM  Result Value Ref Range   Glucose-Capillary 110 (H) 65 - 99 mg/dL   Comment 1 Notify RN   Glucose, capillary     Status: Abnormal   Collection Time: 08/06/17  9:26 PM  Result Value Ref Range   Glucose-Capillary 139 (H) 65 - 99 mg/dL  Glucose, capillary     Status: Abnormal   Collection Time: 08/07/17  6:22 AM  Result Value Ref Range   Glucose-Capillary 137 (H) 65 - 99 mg/dL     HEENT: Normocephalic. Atraumatic. Cardio: RRR without murmur. No JVD  Resp: CTA  B/L and unlabored GI: BS positive and ND Skin:   Intact. Warm and dry. Urology: Urine slightly cloudy and yellow Neuro: Alert/Oriented Motor 5/5 in BUE RLE: 3-/5 HF, KE, ADF (limited still by spasticity 4-/5 Left HF, KE, ADF (limited by spasticity) Musc/Skel:  No edema. No tenderness. Gen NAD. Vital signs reviewed.    Assessment/Plan: 1. 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 Physiatrist is providing close team supervision and 24 hour management of active medical problems listed below. Physiatrist and rehab team continue to assess barriers to discharge/monitor patient progress toward functional and medical goals. FIM: Function - Bathing Position: Bed Body parts bathed by patient: Right arm, Left arm, Chest, Abdomen, Right upper leg, Left  upper leg Body parts bathed by helper: Right lower leg, Left lower leg Bathing not applicable: Buttocks, Front perineal area Assist Level: Touching or steadying assistance(Pt > 75%)  Function- Upper Body Dressing/Undressing What is the patient wearing?: Hospital gown Bra - Perfomed by patient: Thread/unthread right bra strap, Thread/unthread left bra strap Bra - Perfomed by helper: Hook/unhook bra (pull down sports bra) Button up shirt - Perfomed by patient: Thread/unthread right sleeve, Pull shirt around back, Button/unbutton shirt, Thread/unthread left sleeve Orthosis activity level: Performed by patient Assist Level: Set up Set up : To obtain clothing/put away Function - Lower Body Dressing/Undressing What is the patient wearing?: Pants, Socks, Shoes, AFO Pants- Performed by patient: Thread/unthread right pants leg Pants- Performed by helper: Thread/unthread left pants leg, Pull pants up/down Non-skid slipper socks- Performed by helper: Don/doff left sock, Don/doff right sock Socks - Performed by helper: Don/doff right sock, Don/doff left sock Shoes - Performed by helper: Don/doff right shoe, Don/doff left shoe, Fasten right, Fasten left AFO - Performed by helper: Don/doff left AFO Assist for footwear: Maximal assist Assist for lower body dressing: Touching or steadying assistance (Pt > 75%)  Function - Toileting Toileting activity did not occur: No continent bowel/bladder event Toileting steps completed by helper: Adjust clothing prior to toileting, Performs perineal hygiene, Adjust clothing after toileting Assist level: Two helpers  Function - Air cabin crew transfer activity did not occur: Safety/medical concerns  Function - Chair/bed transfer Chair/bed transfer method: Squat pivot Chair/bed transfer assist level: Touching or steadying assistance (Pt > 75%) Chair/bed transfer assistive device: Armrests, Sliding board, Mechanical lift Mechanical lift:  Stedy Chair/bed transfer details: Verbal cues for precautions/safety, Verbal cues for sequencing, Verbal cues for safe use of DME/AE, Verbal cues for technique, Manual facilitation for weight shifting  Function - Locomotion: Wheelchair Will patient use wheelchair at discharge?: Yes Type: Manual Max wheelchair distance: 100 ft Assist Level: Supervision or verbal cues Assist Level: Supervision or verbal cues Assist Level: Supervision or verbal cues Function - Locomotion: Ambulation Ambulation activity did not occur: Safety/medical concerns Walk 10 feet on uneven surfaces activity did not occur: Safety/medical concerns  Function - Comprehension Comprehension: Auditory Comprehension assist level: Follows complex conversation/direction with extra time/assistive device  Function - Expression Expression: Verbal Expression assist level: Expresses complex ideas: With no assist  Function - Social Interaction Social Interaction assist level: Interacts appropriately with others with medication or extra time (anti-anxiety, antidepressant).  Function - Problem Solving Problem solving assist level: Solves basic problems with no assist  Function - Memory Memory assist level: Complete Independence: No helper Patient normally able to recall (first 3 days only): Current season, Location of own room, Staff names and faces, That he or she is  in a hospital  Medical Problem List and Plan: 1.Decreased functional mobilitysecondary to lumbar spondylosis with radiculopathy. Status post L5-S1 anterior retroperitoneal exposure with laminectomy and fusion 07/26/2017 as well as history of multiple thoracic lumbar surgeries. Back brace when out of bed   Cont CIR  2. DVT Prophylaxis/Anticoagulation: SCDs.    Vascular study negative for DVT.  3. Pain Management:Celebrex 200 mg every 12 hours, Neurontin 300 mg 3 times a day, hydrocodone as needed  Baclofen changed back to Robaxin 1000 mg 4 times a day. She  notes she has some improvement with this.  Encourage the use of heat as well as regular stretching.   Tizanidine qhs d/ced on 11/23 due to ?side effects.  4. Mood:Provide emotional support 5. Neuropsych: This patientiscapable of making decisions on herown behalf. 6. Skin/Wound Care:Routine skin checks 7. Fluids/Electrolytes/Nutrition:Routine I&O's   BMP within acceptable range on 11/22 8.Acute blood loss anemia.   Hb 9.6 on 11/22  Continue to monitor  9.Hypertension. Lisinopril 10 mg daily   Increased to 20mg  11/20  Fair control at present Vitals:   08/06/17 0630 08/06/17 1519  BP: (!) 142/64 (!) 165/77  Pulse: 73 74  Resp: 14 18  Temp: 97.8 F (36.6 C) 97.8 F (36.6 C)  SpO2: 97% 97%   10.Diabetes mellitus peripheral neuropathy. Hemoglobin A1c 6.7. Glucophage 500 mg twice a day. Check blood sugars before meals and at bedtime CBG (last 3)   Recent Labs    08/06/17 1658 08/06/17 2126 08/07/17 0622  GLUCAP 110* 139* 137*     Overall controlled 11/25 11.Constipation. Laxative assistance, increased on 11/18, increased again on 11/21 numerous bowel movements yesterday--.  Hold Senokot as per patient request   12.Remote tobacco abuse. Counseling 13. Hypoalbuminemia  Supplement initiated on 11/17 14.  Dysuria: Patient also with noted odor as well as cloudiness.      -Urinalysis is positive, urine culture pending.  Started empiric Macrobid yesterday  LOS (Days) 10 A FACE TO FACE EVALUATION WAS PERFORMED  SWARTZ,ZACHARY T 08/07/2017, 9:28 AM

## 2017-08-08 ENCOUNTER — Inpatient Hospital Stay (HOSPITAL_COMMUNITY): Payer: BLUE CROSS/BLUE SHIELD | Admitting: Physical Therapy

## 2017-08-08 ENCOUNTER — Inpatient Hospital Stay (HOSPITAL_COMMUNITY): Payer: BLUE CROSS/BLUE SHIELD | Admitting: Occupational Therapy

## 2017-08-08 LAB — GLUCOSE, CAPILLARY
GLUCOSE-CAPILLARY: 110 mg/dL — AB (ref 65–99)
GLUCOSE-CAPILLARY: 136 mg/dL — AB (ref 65–99)
Glucose-Capillary: 129 mg/dL — ABNORMAL HIGH (ref 65–99)
Glucose-Capillary: 137 mg/dL — ABNORMAL HIGH (ref 65–99)

## 2017-08-08 NOTE — Progress Notes (Signed)
Physical Therapy Weekly Progress Note  Patient Details  Name: Megan [name] MRN: [id number] Date of Birth: March 11, 1951  Beginning of progress report period: July 29, 2017 End of progress report period: August 08, 2017  Today's Date: 08/08/2017 PT Individual Time: 1100-1200 PT Individual Time Calculation (min): 60 min   Patient has met 2 of 3 short term goals.  Pt has made slow but steady progress this reporting period, and has been limited significantly by RLE spasms and pain.  Following medication adjustment from medical team, pt has noted improvement in spasms and pain and has progressed with transfers and gait.  She continues to require max assist to stand and has been able to ambulate up to 6' with // bars or Sara+.    Patient continues to demonstrate the following deficits muscle weakness and muscle paralysis, impaired timing and sequencing, abnormal tone, unbalanced muscle activation, decreased coordination and decreased motor planning and decreased standing balance, decreased postural control and decreased balance strategies and therefore will continue to benefit from skilled PT intervention to increase functional independence with mobility.  Patient not progressing toward long term goals.  See goal revision..  Plan of care revisions: discharged stair goal and downgraded gait distance.  PT Short Term Goals Week 1:  PT Short Term Goal 1 (Week 1): Pt will tranfser with +1 assist without lift equipment PT Short Term Goal 1 - Progress (Week 1): Met PT Short Term Goal 2 (Week 1): Pt will initiate gait training with LRAD PT Short Term Goal 2 - Progress (Week 1): Met PT Short Term Goal 3 (Week 1): Pt will tolerate 1 minute standing with BUE support x2 minutes PT Short Term Goal 3 - Progress (Week 1): Not met Week 2:  PT Short Term Goal 1 (Week 2): Pt will ambulate 10' with LRAD and min assist PT Short Term Goal 2 (Week 2): Pt will demonstrate bed mobility with min assist  Skilled  Therapeutic Interventions/Progress Updates:    no c/o pain at rest, reports premedicated right before session.  Session focus on activity tolerance with w/c propulsion, ambulation, and therex.    Pt propels w/c to and from therapy gym with BUEs for activity tolerance and mobility.  Gait training x4' +6' with Sara+, therapist providing assist only to steer machine.  Pt demos good step length and foot positioning, but requires min cues for maintaining upright posture.  After both trials pt c/o feeling woozy, BP WNL, pt suspects due to medication.    PT instructed pt in AROM therex for strengthening BLE 2x10 reps hip flexion, LAQ, and hip adduction squeeze, +10 reps UE rows, shoulder press, and chest press.    Pt returned to room at end of session, min assist for squat/pivot back to bed and mod assist to lift LEs into bed.  Pt positioned to comfort with call bell in reach and needs met.   Therapy Documentation Precautions:  Precautions Precautions: Back, Fall Precaution Booklet Issued: Yes (comment) Precaution Comments: pt able to 3/3 back precautions Required Braces or Orthoses: Spinal Brace, Other Brace/Splint Spinal Brace: Lumbar corset, Applied in sitting position Other Brace/Splint: AFO RLE Restrictions Weight Bearing Restrictions: No   See Function Navigator for Current Functional Status.  Therapy/Group: Individual Therapy  Michel Santee 08/08/2017, 12:11 PM

## 2017-08-08 NOTE — Progress Notes (Addendum)
Occupational Therapy Weekly Progress Note  Patient Details  Name: Megan [name] MRN: [id number] Date of Birth: 1951/07/25  Beginning of progress report period: July 29, 2017 End of progress report period: August 09, 2017  Session 1 Today's Date: 08/08/2017 OT Individual Time: 3845-3646 OT Individual Time Calculation (min): 72 min   Session 2 Today's Date: 08/08/2017 OT Individual Time: 1305-1400 OT Individual Time Calculation (min): 55 min    Patient has met 1 of 4 short term goals.  At the beginning of the week, pt limited by severe pain and muscle spasms. Pt with much improved medical status in the last few days and has tolerated bed mobility, transfers, BADL tasks, and OOB activities. Pt can stand from raised surface with Max A in preparation for BADL tasks. Bed level bathing/dressing with mod/max A, and can squat-pivot transfer with min guard A.  Patient continues to demonstrate the following deficits: muscle weakness and decreased standing balance, decreased postural control, decreased balance strategies and difficulty maintaining precautions and therefore will continue to benefit from skilled OT intervention to enhance overall performance with BADL and Reduce care partner burden.  Patient progressing toward long term goals..  Continue plan of care.  OT Short Term Goals Week 1:  OT Short Term Goal 1 (Week 1): Pt will sit to stand with MOD A of 1 wiht RW to decrease burden of care OT Short Term Goal 1 - Progress (Week 1): Progressing toward goal OT Short Term Goal 2 (Week 1): Pt will stand pivot transfer to [facility] with MAX A 1 caregiver OT Short Term Goal 2 - Progress (Week 1): Progressing toward goal OT Short Term Goal 3 (Week 1): Pt will don footwear with AE PRN OT Short Term Goal 3 - Progress (Week 1): Progressing toward goal OT Short Term Goal 4 (Week 1): Pt will bathe LB wiht MIN A PRN OT Short Term Goal 4 - Progress (Week 1): Met Week 2:  OT Short Term Goal 1 (Week  2): Pt will sit to stand with MOD A of 1 wiht RW to decrease burden of care OT Short Term Goal 2 (Week 2): Pt will stand pivot transfer to [facility] with MAX A 1 caregiver OT Short Term Goal 3 (Week 2): Pt will complete 1/3 toileting steps to decrease burden of care. OT Short Term Goal 4 (Week 2): Pt will comlete LB dressing with min A used ADL AE as needed  Skilled Therapeutic Interventions/Progress Updates:  Session 1   OT treatment session focused on modified bathing/dressing, sit<>stand, and transfers. Pt transferred to sitting EOB with min A. Pt completed bathing/dressing seated EOB using long-handled ADL AE with overall mod A to lift R LE into pant leg and don shoes/AFO. Sit<>stand from raised bed with RW and Max A, the OT assisted with pulling up pants. Pt completed 2 more sit<>stands with rest breaks in between. Squat-pivot to wc on L side with set-up A and close supervision. Grooming tasks completed wc level at the sink. Pt then practiced squat-pivot toilet transfers to wide drop arm commode over toilet with min A overall. Discussed leaning technique to doff pants and complete hygiene. Pt left seated in wc at end of session with needs met and call bell in reach.   Session 2 OT treatment session focused on LB dressing, UB and LB there-ex, activity tolerance, and transfers. Pt greeted semi-reclined in bed finishing lunch with daughter present, and agreeable to OT. Discussed OT goals and treatment plan with pt and her daughter  as well as pt's progress in the last few days. Pt came to sitting EOB with min A to elevate trunk and set-up A to obtain LSO. Worked on hip external rotation to don shoes with pt able to achieve figure 4 position with LLE and don L shoe with set-up A. Pt unable to tolerate enough hip flex/ext rotation to achieve figure 4 on R 2/2 pain- assistance needed to don R shoe and AFO. Squat-pivot transfer to R with close supervision.  UB strength/coordination with wc propulsion to day room. 6  mins on NuStep on level 1 until pt reported shooting nerve pain in R LE. Examined R LE and AFO pressing into achilles. Assisted with AFO adjustment, with improved position. Pt reported feeling her bladder was distended without the urge to urinate. Pt felt she needed to be catheterized. Pt transferred back to wc in similar fashion and propelled wc back to her room. Squat-pivot back to bed with min A and assistance to doff shoes. Assistance to lift B LE's back into bed and pt left with needs met.    Therapy Documentation Precautions:  Precautions Precautions: Back, Fall Precaution Booklet Issued: Yes (comment) Precaution Comments: pt able to 3/3 back precautions Required Braces or Orthoses: Spinal Brace, Other Brace/Splint Spinal Brace: Lumbar corset, Applied in sitting position Other Brace/Splint: AFO RLE Restrictions Weight Bearing Restrictions: No Pain: Pain Assessment Pain Assessment: 0-10 Pain Score: 5  Pain Type: Surgical pain Pain Location: Back Pain Orientation: Lower Pain Descriptors / Indicators: Aching Pain Frequency: Intermittent Pain Onset: With Activity Patients Stated Pain Goal: 3 Pain Intervention(s): Repositioned  See Function Navigator for Current Functional Status.   Therapy/Group: Individual Therapy  [name] 08/08/2017, 2:10 PM

## 2017-08-08 NOTE — Plan of Care (Signed)
  Not Progressing SCI BOWEL ELIMINATION RH STG MANAGE BOWEL WITH ASSISTANCE Description STG Manage Bowel with Min Assistance  08/08/2017 1241 - Not Progressing by Marney Setting, RN Note Incontinent of bowel  RH STG SCI MANAGE BOWEL WITH MEDICATION WITH ASSISTANCE Description STG SCI Manage bowel with medication with Min assistance  08/08/2017 1241 - Not Progressing by Marney Setting, RN RH STG SCI MANAGE BOWEL PROGRAM W/ASSIST OR AS APPROPRIATE Description STG SCI Manage bowel program total assist  08/08/2017 1241 - Not Progressing by Marney Setting, RN SCI BLADDER ELIMINATION RH STG MANAGE BLADDER WITH ASSISTANCE Description STG Manage Bladder With Max Assistance  08/08/2017 1241 - Not Progressing by Marney Setting, RN Note REquiring I&O cath. UTI with macrobid  RH STG MANAGE BLADDER WITH EQUIPMENT WITH ASSISTANCE Description STG Manage Bladder With Equipment With Max Assistance  08/08/2017 1241 - Not Progressing by Marney Setting, RN

## 2017-08-08 NOTE — Progress Notes (Signed)
Subjective/Complaints:  Unable to straighten RLE while standing ROS: pt denies nausea, vomiting, diarrhea, cough, shortness of breath or chest pain   Objective:  Vital Signs: Blood pressure (!) 147/70, pulse 80, temperature 98 F (36.7 C), temperature source Oral, resp. rate 18, height 5\' 8"  (1.727 m), weight 87 kg (191 lb 14.4 oz), SpO2 96 %. No results found. Results for orders placed or performed during the hospital encounter of 07/28/17 (from the past 72 hour(s))  Glucose, capillary     Status: Abnormal   Collection Time: 08/05/17 11:30 AM  Result Value Ref Range   Glucose-Capillary 158 (H) 65 - 99 mg/dL  Glucose, capillary     Status: Abnormal   Collection Time: 08/05/17  5:16 PM  Result Value Ref Range   Glucose-Capillary 121 (H) 65 - 99 mg/dL  Glucose, capillary     Status: Abnormal   Collection Time: 08/05/17 10:05 PM  Result Value Ref Range   Glucose-Capillary 184 (H) 65 - 99 mg/dL  Glucose, capillary     Status: Abnormal   Collection Time: 08/06/17  6:24 AM  Result Value Ref Range   Glucose-Capillary 146 (H) 65 - 99 mg/dL  Urinalysis, Complete w Microscopic     Status: Abnormal   Collection Time: 08/06/17  9:41 AM  Result Value Ref Range   Color, Urine YELLOW YELLOW   APPearance HAZY (A) CLEAR   Specific Gravity, Urine 1.012 1.005 - 1.030   pH 5.0 5.0 - 8.0   Glucose, UA NEGATIVE NEGATIVE mg/dL   Hgb urine dipstick NEGATIVE NEGATIVE   Bilirubin Urine NEGATIVE NEGATIVE   Ketones, ur NEGATIVE NEGATIVE mg/dL   Protein, ur NEGATIVE NEGATIVE mg/dL   Nitrite NEGATIVE NEGATIVE   Leukocytes, UA SMALL (A) NEGATIVE   RBC / HPF 0-5 0 - 5 RBC/hpf   WBC, UA 6-30 0 - 5 WBC/hpf   Bacteria, UA MANY (A) NONE SEEN   Squamous Epithelial / LPF 0-5 (A) NONE SEEN   Mucus PRESENT   Urine Culture     Status: Abnormal   Collection Time: 08/06/17 10:01 AM  Result Value Ref Range   Specimen Description URINE, CLEAN CATCH    Special Requests NONE    Culture MULTIPLE SPECIES  PRESENT, SUGGEST RECOLLECTION (A)    Report Status 08/07/2017 FINAL   Glucose, capillary     Status: Abnormal   Collection Time: 08/06/17 12:31 PM  Result Value Ref Range   Glucose-Capillary 115 (H) 65 - 99 mg/dL   Comment 1 Notify RN   Glucose, capillary     Status: Abnormal   Collection Time: 08/06/17  4:58 PM  Result Value Ref Range   Glucose-Capillary 110 (H) 65 - 99 mg/dL   Comment 1 Notify RN   Glucose, capillary     Status: Abnormal   Collection Time: 08/06/17  9:26 PM  Result Value Ref Range   Glucose-Capillary 139 (H) 65 - 99 mg/dL  Glucose, capillary     Status: Abnormal   Collection Time: 08/07/17  6:22 AM  Result Value Ref Range   Glucose-Capillary 137 (H) 65 - 99 mg/dL  Glucose, capillary     Status: Abnormal   Collection Time: 08/07/17 12:07 PM  Result Value Ref Range   Glucose-Capillary 121 (H) 65 - 99 mg/dL  Glucose, capillary     Status: Abnormal   Collection Time: 08/07/17  5:20 PM  Result Value Ref Range   Glucose-Capillary 144 (H) 65 - 99 mg/dL  Glucose, capillary     Status:  Abnormal   Collection Time: 08/07/17  9:22 PM  Result Value Ref Range   Glucose-Capillary 123 (H) 65 - 99 mg/dL  Glucose, capillary     Status: Abnormal   Collection Time: 08/08/17  6:53 AM  Result Value Ref Range   Glucose-Capillary 129 (H) 65 - 99 mg/dL     HEENT: Normocephalic. Atraumatic. Cardio: RRR without murmur. No JVD  Resp: CTA B/L and unlabored GI: BS positive and ND Skin:   Intact. Warm and dry. Urology: Urine slightly cloudy and yellow Neuro: Alert/Oriented Motor 5/5 in BUE RLE: 3-/5 HF, KE, ADF (limited still by spasticity 4-/5 Left HF, KE, ADF (limited by spasticity) Tone MAS 3 in R hamstring Musc/Skel:  No edema. No tenderness. Gen NAD. Vital signs reviewed.    Assessment/Plan: 1. 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 Physiatrist is providing  close team supervision and 24 hour management of active medical problems listed below. Physiatrist and rehab team continue to assess barriers to discharge/monitor patient progress toward functional and medical goals. FIM: Function - Bathing Position: Sitting EOB Body parts bathed by patient: Right arm, Left arm, Chest, Abdomen, Right upper leg, Left upper leg, Right lower leg, Left lower leg Body parts bathed by helper: Back Bathing not applicable: Buttocks, Front perineal area Assist Level: Touching or steadying assistance(Pt > 75%) Assistive Device Comment: LH sponge  Function- Upper Body Dressing/Undressing What is the patient wearing?: Bra, Pull over shirt/dress, Orthosis Bra - Perfomed by patient: Thread/unthread right bra strap, Thread/unthread left bra strap, Hook/unhook bra (pull down sports bra) Bra - Perfomed by helper: Hook/unhook bra (pull down sports bra) Pull over shirt/dress - Perfomed by patient: Thread/unthread right sleeve, Thread/unthread left sleeve, Put head through opening, Pull shirt over trunk Button up shirt - Perfomed by patient: Thread/unthread right sleeve, Pull shirt around back, Button/unbutton shirt, Thread/unthread left sleeve Orthosis activity level: Performed by patient Assist Level: Set up Set up : To obtain clothing/put away Function - Lower Body Dressing/Undressing What is the patient wearing?: Pants, Socks Position: Sitting EOB(EOB UB, bedlevel LB) Pants- Performed by patient: Thread/unthread right pants leg, Thread/unthread left pants leg Pants- Performed by helper: Pull pants up/down Non-skid slipper socks- Performed by helper: Don/doff left sock, Don/doff right sock Socks - Performed by patient: Don/doff left sock Socks - Performed by helper: Don/doff right sock Shoes - Performed by helper: Don/doff right shoe, Don/doff left shoe, Fasten right, Fasten left AFO - Performed by helper: Don/doff left AFO Assist for footwear: Partial/moderate  assist Assist for lower body dressing: Touching or steadying assistance (Pt > 75%)  Function - Toileting Toileting activity did not occur: No continent bowel/bladder event Toileting steps completed by helper: Adjust clothing after toileting, Performs perineal hygiene, Adjust clothing prior to toileting Assist level: Two helpers  Function - Air cabin crew transfer activity did not occur: Safety/medical concerns  Function - Chair/bed transfer Chair/bed transfer method: Squat pivot Chair/bed transfer assist level: Touching or steadying assistance (Pt > 75%) Chair/bed transfer assistive device: Armrests Mechanical lift: Stedy Chair/bed transfer details: Verbal cues for precautions/safety, Verbal cues for safe use of DME/AE, Verbal cues for technique  Function - Locomotion: Wheelchair Will patient use wheelchair at discharge?: Yes Type: Manual Max wheelchair distance: 150' Assist Level: Supervision or verbal cues Assist Level: Supervision or verbal cues Assist Level: Supervision or verbal cues Turns around,maneuvers to table,bed, and toilet,negotiates 3% grade,maneuvers on rugs and over doorsills: No Function -  Locomotion: Ambulation Ambulation activity did not occur: Safety/medical concerns Assistive device: Parallel bars Max distance: 6 ft Assist level: 2 helpers(mod assist, +2 for w/c follow) Walk 10 feet on uneven surfaces activity did not occur: Safety/medical concerns  Function - Comprehension Comprehension: Auditory Comprehension assist level: Follows complex conversation/direction with extra time/assistive device  Function - Expression Expression: Verbal Expression assist level: Expresses complex ideas: With extra time/assistive device  Function - Social Interaction Social Interaction assist level: Interacts appropriately with others with medication or extra time (anti-anxiety, antidepressant).  Function - Problem Solving Problem solving assist level: Solves  basic problems with no assist  Function - Memory Memory assist level: Complete Independence: No helper Patient normally able to recall (first 3 days only): Current season, Location of own room, Staff names and faces, That he or she is in a hospital  Medical Problem List and Plan: 1.Decreased functional mobilitysecondary to lumbar spondylosis with radiculopathy. Status post L5-S1 anterior retroperitoneal exposure with laminectomy and fusion 07/26/2017 as well as history of multiple thoracic lumbar surgeries. Back brace when out of bed   Cont CIR PT, OT, SLP 2. DVT Prophylaxis/Anticoagulation: SCDs.    Vascular study negative for DVT.  3. Pain Management:Celebrex 200 mg every 12 hours, Neurontin 300 mg 3 times a day, hydrocodone as needed  Baclofen changed back to Robaxin 1000 mg 4 times a day. She notes she has some improvement with this.  Encourage the use of heat as well as regular stretching.Robaxin likely helping with pain but no change in spasticity   Tizanidine qhs d/ced on 11/23 due to ?side effects.  4. Mood:Provide emotional support 5. Neuropsych: This patientiscapable of making decisions on herown behalf. 6. Skin/Wound Care:Routine skin checks 7. Fluids/Electrolytes/Nutrition:Routine I&O's   BMP within acceptable range on 11/22 8.Acute blood loss anemia.   Hb 9.6 on 11/22  Continue to monitor  9.Hypertension. Lisinopril 10 mg daily   Increased to 20mg  11/20  Mild intermittent systolic elevation, no change in meds 11/26 Vitals:   08/07/17 1446 08/08/17 0616  BP: 136/61 (!) 147/70  Pulse: 71 80  Resp: 18 18  Temp: 98.1 F (36.7 C) 98 F (36.7 C)  SpO2: 100% 96%   10.Diabetes mellitus peripheral neuropathy. Hemoglobin A1c 6.7. Glucophage 500 mg twice a day. Check blood sugars before meals and at bedtime CBG (last 3)   Recent Labs    08/07/17 1720 08/07/17 2122 08/08/17 0653  GLUCAP 144* 123* 129*     Overall controlled 11/26 11.Constipation. Laxative  assistance, increased on 11/18, increased again on 11/21 numerous bowel movements yesterday--.  Hold Senokot as per patient request   12.Remote tobacco abuse. Counseling 13. Hypoalbuminemia  Supplement initiated on 11/17 14.  Dysuria: Patient also with noted odor as well as cloudiness.      -Urinalysis is positive, urine culture pending.  Started empiric Macrobid 11/24- afeb  LOS (Days) 11 A FACE TO FACE EVALUATION WAS PERFORMED  Charlett Blake 08/08/2017, 7:53 AM

## 2017-08-09 ENCOUNTER — Encounter (HOSPITAL_COMMUNITY): Payer: BLUE CROSS/BLUE SHIELD | Admitting: Psychology

## 2017-08-09 ENCOUNTER — Inpatient Hospital Stay (HOSPITAL_COMMUNITY): Payer: BLUE CROSS/BLUE SHIELD | Admitting: Physical Therapy

## 2017-08-09 ENCOUNTER — Inpatient Hospital Stay (HOSPITAL_COMMUNITY): Payer: BLUE CROSS/BLUE SHIELD | Admitting: Occupational Therapy

## 2017-08-09 ENCOUNTER — Inpatient Hospital Stay (HOSPITAL_COMMUNITY): Payer: BLUE CROSS/BLUE SHIELD

## 2017-08-09 LAB — GLUCOSE, CAPILLARY
GLUCOSE-CAPILLARY: 120 mg/dL — AB (ref 65–99)
GLUCOSE-CAPILLARY: 96 mg/dL (ref 65–99)
Glucose-Capillary: 108 mg/dL — ABNORMAL HIGH (ref 65–99)
Glucose-Capillary: 168 mg/dL — ABNORMAL HIGH (ref 65–99)

## 2017-08-09 MED ORDER — TAMSULOSIN HCL 0.4 MG PO CAPS
0.4000 mg | ORAL_CAPSULE | Freq: Every day | ORAL | Status: DC
  Administered 2017-08-09 – 2017-08-18 (×10): 0.4 mg via ORAL
  Filled 2017-08-09 (×10): qty 1

## 2017-08-09 NOTE — Progress Notes (Signed)
Physical Therapy Session Note  Patient Details  Name: Megan [name] MRN: [id number] Date of Birth: 1951/01/16  Today's Date: 08/09/2017 PT Individual Time: 1100-1200 PT Individual Time Calculation (min): 60 min   Short Term Goals: Week 2:  PT Short Term Goal 1 (Week 2): Pt will ambulate 10' with LRAD and min assist PT Short Term Goal 2 (Week 2): Pt will demonstrate bed mobility with min assist  Skilled Therapeutic Interventions/Progress Updates:    no c/o pain.  Session focus on sit<>stand transfers and activity tolerance.    Pt requires min assist to roll to L and mod assist to bring trunk upright to come to sitting EOB.  Squat/pivot to w/c on R with min assist. Pt propels w/c throughout unit mod I.    Attempted sit<>stand x3 from w/c with EVA, pt with good power up pushing with UEs, but unable to translate trunk over LEs to complete stand.  Attempted sit<>stand x3 from w/c with RW, but pt still unable to shift weight forward.  She explained that she had fallen forward over her RW after d/c from rehab in April and was hesitant to lean too far forward as a result.    Pt completes 10x glute squeezes with 3 second hold in sitting.  Supported standing in standing frame x2 minutes for weight bearing and pre gait weight shifting L<>R, minisquats, and marches.   PT discussed power mobility eval with pt, with plan to perform later this week.  Pt returned to room at end of session and positioned upright in w/c with call bell in reach and needs met.   Therapy Documentation Precautions:  Precautions Precautions: Back, Fall Precaution Booklet Issued: Yes (comment) Precaution Comments: pt able to 3/3 back precautions Required Braces or Orthoses: Spinal Brace, Other Brace/Splint Spinal Brace: Lumbar corset, Applied in sitting position Other Brace/Splint: AFO RLE Restrictions Weight Bearing Restrictions: No   See Function Navigator for Current Functional Status.   Therapy/Group:  Individual Therapy  Michel Santee 08/09/2017, 2:18 PM

## 2017-08-09 NOTE — Progress Notes (Signed)
Subjective/Complaints:  Still no spont void.  Bowels are working, has not tried to get on toilet, I recommended this to pt ROS: pt denies nausea, vomiting, diarrhea, cough, shortness of breath or chest pain   Objective:  Vital Signs: Blood pressure 136/61, pulse 69, temperature 97.7 F (36.5 C), temperature source Oral, resp. rate 18, height 5\' 8"  (1.727 m), weight 87 kg (191 lb 14.4 oz), SpO2 99 %. No results found. Results for orders placed or performed during the hospital encounter of 07/28/17 (from the past 72 hour(s))  Urinalysis, Complete w Microscopic     Status: Abnormal   Collection Time: 08/06/17  9:41 AM  Result Value Ref Range   Color, Urine YELLOW YELLOW   APPearance HAZY (A) CLEAR   Specific Gravity, Urine 1.012 1.005 - 1.030   pH 5.0 5.0 - 8.0   Glucose, UA NEGATIVE NEGATIVE mg/dL   Hgb urine dipstick NEGATIVE NEGATIVE   Bilirubin Urine NEGATIVE NEGATIVE   Ketones, ur NEGATIVE NEGATIVE mg/dL   Protein, ur NEGATIVE NEGATIVE mg/dL   Nitrite NEGATIVE NEGATIVE   Leukocytes, UA SMALL (A) NEGATIVE   RBC / HPF 0-5 0 - 5 RBC/hpf   WBC, UA 6-30 0 - 5 WBC/hpf   Bacteria, UA MANY (A) NONE SEEN   Squamous Epithelial / LPF 0-5 (A) NONE SEEN   Mucus PRESENT   Urine Culture     Status: Abnormal   Collection Time: 08/06/17 10:01 AM  Result Value Ref Range   Specimen Description URINE, CLEAN CATCH    Special Requests NONE    Culture MULTIPLE SPECIES PRESENT, SUGGEST RECOLLECTION (A)    Report Status 08/07/2017 FINAL   Glucose, capillary     Status: Abnormal   Collection Time: 08/06/17 12:31 PM  Result Value Ref Range   Glucose-Capillary 115 (H) 65 - 99 mg/dL   Comment 1 Notify RN   Glucose, capillary     Status: Abnormal   Collection Time: 08/06/17  4:58 PM  Result Value Ref Range   Glucose-Capillary 110 (H) 65 - 99 mg/dL   Comment 1 Notify RN   Glucose, capillary     Status: Abnormal   Collection Time: 08/06/17  9:26 PM  Result Value Ref Range   Glucose-Capillary  139 (H) 65 - 99 mg/dL  Glucose, capillary     Status: Abnormal   Collection Time: 08/07/17  6:22 AM  Result Value Ref Range   Glucose-Capillary 137 (H) 65 - 99 mg/dL  Urine Culture     Status: None (Preliminary result)   Collection Time: 08/07/17 11:01 AM  Result Value Ref Range   Specimen Description URINE, CATHETERIZED    Special Requests NONE    Culture CULTURE REINCUBATED FOR BETTER GROWTH    Report Status PENDING   Glucose, capillary     Status: Abnormal   Collection Time: 08/07/17 12:07 PM  Result Value Ref Range   Glucose-Capillary 121 (H) 65 - 99 mg/dL  Glucose, capillary     Status: Abnormal   Collection Time: 08/07/17  5:20 PM  Result Value Ref Range   Glucose-Capillary 144 (H) 65 - 99 mg/dL  Glucose, capillary     Status: Abnormal   Collection Time: 08/07/17  9:22 PM  Result Value Ref Range   Glucose-Capillary 123 (H) 65 - 99 mg/dL  Glucose, capillary     Status: Abnormal   Collection Time: 08/08/17  6:53 AM  Result Value Ref Range   Glucose-Capillary 129 (H) 65 - 99 mg/dL  Glucose, capillary  Status: Abnormal   Collection Time: 08/08/17 12:04 PM  Result Value Ref Range   Glucose-Capillary 137 (H) 65 - 99 mg/dL  Glucose, capillary     Status: Abnormal   Collection Time: 08/08/17  4:41 PM  Result Value Ref Range   Glucose-Capillary 110 (H) 65 - 99 mg/dL  Glucose, capillary     Status: Abnormal   Collection Time: 08/08/17  9:11 PM  Result Value Ref Range   Glucose-Capillary 136 (H) 65 - 99 mg/dL  Glucose, capillary     Status: Abnormal   Collection Time: 08/09/17  6:44 AM  Result Value Ref Range   Glucose-Capillary 120 (H) 65 - 99 mg/dL     HEENT: Normocephalic. Atraumatic. Cardio: RRR without murmur. No JVD  Resp: CTA B/L and unlabored GI: BS positive and ND Skin:   Intact. Warm and dry. Urology: Urine slightly cloudy and yellow Neuro: Alert/Oriented Motor 5/5 in BUE RLE: 3-/5 HF, KE, ADF (limited still by spasticity 4-/5 Left HF, KE, ADF (limited  by spasticity) Tone MAS 3 in R hamstring Musc/Skel:  No edema. No tenderness. Gen NAD. Vital signs reviewed.    Assessment/Plan: 1. 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 Physiatrist is providing close team supervision and 24 hour management of active medical problems listed below. Physiatrist and rehab team continue to assess barriers to discharge/monitor patient progress toward functional and medical goals. FIM: Function - Bathing Position: Sitting EOB Body parts bathed by patient: Right arm, Left arm, Chest, Abdomen, Right upper leg, Left upper leg, Right lower leg, Left lower leg Body parts bathed by helper: Back Bathing not applicable: Buttocks, Front perineal area Assist Level: Touching or steadying assistance(Pt > 75%) Assistive Device Comment: LH sponge  Function- Upper Body Dressing/Undressing What is the patient wearing?: Pull over shirt/dress, Bra Bra - Perfomed by patient: Thread/unthread right bra strap, Hook/unhook bra (pull down sports bra), Thread/unthread left bra strap Bra - Perfomed by helper: Hook/unhook bra (pull down sports bra) Pull over shirt/dress - Perfomed by patient: Pull shirt over trunk, Thread/unthread left sleeve, Thread/unthread right sleeve, Put head through opening Button up shirt - Perfomed by patient: Thread/unthread right sleeve, Pull shirt around back, Button/unbutton shirt, Thread/unthread left sleeve Orthosis activity level: Performed by patient Assist Level: Set up Set up : To obtain clothing/put away Function - Lower Body Dressing/Undressing What is the patient wearing?: Pants, Socks, Shoes, AFO Position: Sitting EOB Pants- Performed by patient: Thread/unthread right pants leg, Pull pants up/down Pants- Performed by helper: Thread/unthread left pants leg Non-skid slipper socks- Performed by helper: Don/doff left sock, Don/doff right  sock Socks - Performed by patient: Don/doff right sock, Don/doff left sock Socks - Performed by helper: Don/doff right sock Shoes - Performed by helper: Don/doff left shoe, Fasten left, Don/doff right shoe, Fasten right AFO - Performed by helper: Don/doff right AFO Assist for footwear: Partial/moderate assist Assist for lower body dressing: Touching or steadying assistance (Pt > 75%)  Function - Toileting Toileting activity did not occur: No continent bowel/bladder event Toileting steps completed by helper: Adjust clothing after toileting, Performs perineal hygiene, Adjust clothing prior to toileting Assist level: Two helpers  Function - Air cabin crew transfer activity did not occur: Safety/medical concerns Toilet transfer assistive device: Drop arm commode, Elevated toilet seat/BSC over toilet, Grab bar Assist level to toilet: Touching or steadying assistance (Pt > 75%) Assist level from toilet: Touching or steadying assistance (Pt > 75%)  Function - Chair/bed transfer Chair/bed transfer method: Squat pivot Chair/bed transfer assist level: Touching or steadying assistance (Pt > 75%) Chair/bed transfer assistive device: Armrests Mechanical lift: Stedy Chair/bed transfer details: Verbal cues for precautions/safety, Verbal cues for safe use of DME/AE, Verbal cues for technique  Function - Locomotion: Wheelchair Will patient use wheelchair at discharge?: Yes Type: Manual Max wheelchair distance: 150' Assist Level: No help, No cues, assistive device, takes more than reasonable amount of time Assist Level: No help, No cues, assistive device, takes more than reasonable amount of time Assist Level: No help, No cues, assistive device, takes more than reasonable amount of time Turns around,maneuvers to table,bed, and toilet,negotiates 3% grade,maneuvers on rugs and over doorsills: No Function - Locomotion: Ambulation Ambulation activity did not occur: Safety/medical  concerns Assistive device: Clarise Cruz plus Max distance: 6' Assist level: Maximal assist (Pt 25 - 49%) Walk 10 feet on uneven surfaces activity did not occur: Safety/medical concerns  Function - Comprehension Comprehension: Auditory Comprehension assist level: Follows complex conversation/direction with extra time/assistive device  Function - Expression Expression: Verbal Expression assist level: Expresses complex ideas: With extra time/assistive device  Function - Social Interaction Social Interaction assist level: Interacts appropriately with others with medication or extra time (anti-anxiety, antidepressant).  Function - Problem Solving Problem solving assist level: Solves basic problems with no assist  Function - Memory Memory assist level: Complete Independence: No helper Patient normally able to recall (first 3 days only): Current season, Location of own room, Staff names and faces, That he or she is in a hospital  Medical Problem List and Plan: 1.Decreased functional mobilitysecondary to lumbar spondylosis with radiculopathy. Status post L5-S1 anterior retroperitoneal exposure with laminectomy and fusion 07/26/2017 as well as history of multiple thoracic lumbar surgeries. Back brace when out of bed   Cont CIR PT, OT, SLP 2. DVT Prophylaxis/Anticoagulation: SCDs.    Vascular study negative for DVT.  3. Pain Management:Celebrex 200 mg every 12 hours, Neurontin 300 mg 3 times a day, hydrocodone as needed  Baclofen changed back to Robaxin 1000 mg 4 times a day. She notes she has some improvement with this.  Encourage the use of heat as well as regular stretching.Robaxin likely helping with pain but no change in spasticity   Tizanidine qhs d/ced on 11/23 due to ?side effects.  4. Mood:Provide emotional support 5. Neuropsych: This patientiscapable of making decisions on herown behalf. 6. Skin/Wound Care:Routine skin checks 7. Fluids/Electrolytes/Nutrition:Routine I&O's   BMP  within acceptable range on 11/22 8.Acute blood loss anemia.   Hb 9.6 on 11/22  Continue to monitor  9.Hypertension. Lisinopril 10 mg daily   Increased to 20mg  11/20  Mild intermittent systolic elevation, no change in meds 11/27 Vitals:   08/08/17 0616 08/08/17 1429  BP: (!) 147/70 136/61  Pulse: 80 69  Resp: 18 18  Temp: 98 F (36.7 C) 97.7 F (36.5 C)  SpO2: 96% 99%   10.Diabetes mellitus peripheral neuropathy. Hemoglobin A1c 6.7. Glucophage 500 mg twice a day. Check blood sugars before meals and at bedtime CBG (last 3)   Recent Labs    08/08/17 1641 08/08/17 2111 08/09/17 0644  GLUCAP 110* 136* 120*     Overall controlled 11/27 11.Constipation. Laxative assistance, increased on 11/18, increased again on 11/21 numerous bowel movements yesterday--.  Hold Senokot as per patient request   12.Remote tobacco abuse. Counseling 13. Hypoalbuminemia  Supplement initiated on 11/17 14.  Dysuria: Patient also with noted odor as well as cloudiness.      -  Urinalysis is positive, urine culture pending.  Started empiric Macrobid 11/24- afeb 15.  Urinary retention add flomax, consider urecholine LOS (Days) 12 A FACE TO FACE EVALUATION WAS PERFORMED  Charlett Blake 08/09/2017, 7:57 AM

## 2017-08-09 NOTE — Consult Note (Signed)
Neuropsychological Consultation   Patient:   Megan [name]   DOB:   1951-03-19  MR Number:  [id number]  Location:  [place] [address] [facility] [address] [id number] [place]  [address] Dept: [place]: [phone]           Date of Service:   08/09/2017  Start Time:   1 PM End Time:   2 PM  Provider/Observer:  Ilean Skill, Psy.D.       Clinical Neuropsychologist       Billing Code/Service: 88280 4 Units  Chief Complaint:    Megan [name] is a 66 year old female with history of hypertension,remote tobacco abuse,diabetes mellitus, multiple prior thoracic or lumbar surgeriesand received inpatient rehabilitation services April 2018 after undergoing T9 through T12 decompression and fusion for a left T10-T11 herniated nucleus pulposus with severe stenosis.  Was still taking gabapentin for paresthesias, patient was completing outpatient therapy when she noted increasing left lower extremity weakness, she was evaluated by neurosurgery and on 06/05/2017 underwent thoracic and lumbar MRI which showed a new left L5-S1 protrusion compressing S1 nerve root.Admitted 07/26/2017 with progressive back pain and lower extremity weakness. X-rays and imaging revealed lumbosacral spondylosis with radiculopathy. Underwent L5-S1 anterior retroperitoneal exposure L5-S1 laminectomy, L5-S1 posterior lateral fixation with fusion 07/26/2017 per Dr.Ditty.  Patient has been coping with long/multiple hospital stays and dealing with extended rehab efforts.  Reason for Service:  Megan [name] was referred for psychological consultation to help with adjustment and coping issues developing out of multiple back surgeries and extended hospital inpatient rehab efforts.  Below is the HPI for the current admission.  Megan [name] a 66 y.o.right handed femalewith history of hypertension,remote tobacco abuse,diabetes mellitus,  multiple prior thoracic or lumbar surgeriesand received inpatient rehabilitation services April 2018 after undergoing T9 through T12 decompression and fusion for a left T10-T11 herniated nucleus pulposus with severe stenosis.. Per chart review, husband, and patient,patient lives with spouse. One level home with ramped entrance. Spouse is self-employed and can assist as neededas well as assistance from a neighbor and her daughter. Patient with progressive weakness over the past few months hasrequired scooter for household and community ambulation secondary to bilateral lower extremity weakness. She needed assistance with meals and bathing.  Last PMR visit August 2018, at that time still had right hip flexor and ankle dorsiflexor weakness.  Was still taking gabapentin for paresthesias, patient was completing outpatient therapy when she noted increasing left lower extremity weakness, she was evaluated by neurosurgery and on 06/05/2017 underwent thoracic and lumbar MRI which showed a new left L5-S1 protrusion compressing S1 nerve root.Admitted 07/26/2017 with progressive back pain and lower extremity weakness. X-rays and imaging revealed lumbosacral spondylosis with radiculopathy. Underwent L5-S1 anterior retroperitoneal exposure L5-S1 laminectomy, L5-S1 posterior lateral fixation with fusion 07/26/2017 per Dr.Ditty.Hospital course pain management. Back brace when out of bed applied in sitting position. Acute blood loss anemia 9.1and monitored.Close monitoring of any orthostasis when out of bed.Physical and occupational therapy evaluations completed with recommendations of physical medicine rehabilitation consult.Patient was admitted for a comprehensive rehabilitation program  Current Status:  The patient reports that she is doing fairly well from a mood standpoint and denies symptoms of anxiety or depression.  He has support from husband and is working hard in PT and OT.  The patient reports that she is  now adapting well to ongoing therapeutic interventions.   Behavioral Observation: Megan [name]  presents as a 66 y.o.-year-old  Right Caucasian Female who appeared her stated age. her dress was Appropriate and she was Well Groomed and her manners were Appropriate to the situation.  her participation was indicative of Appropriate and Attentive behaviors.  There were physical disabilities noted.  she displayed an appropriate level of cooperation and motivation.     Interactions:    Active Appropriate and Attentive  Attention:   within normal limits and attention span and concentration were age appropriate  Memory:   within normal limits; recent and remote memory intact  Visuo-spatial:  within normal limits  Speech (Volume):  normal  Speech:   normal;   Thought Process:  Coherent and Relevant  Though Content:  WNL; not suicidal  Orientation:   person, place, time/date and situation  Judgment:   Good  Planning:   Good  Affect:    Appropriate  Mood:    Euthymic  Insight:   Present  Intelligence:   normal  Marital Status/Living: Patient is married and husband has been supportive and helping with care when the patient is home.  Medical History:   Past Medical History:  Diagnosis Date  . Arthritis    "hands, back" (07/26/2017)  . Dyspnea    W/ PHYS CONDITION   . GERD (gastroesophageal reflux disease)   . History of bronchitis    "not since I quit smoking" (07/26/2017)  . History of kidney stones   . History of shingles   . Hyperlipidemia    takes Fish Oil daily  . Hypertension   . Neuromuscular disorder (HCC)    tingling toes  . 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 Pneumonia 2009  . PONV (postoperative nausea and vomiting)   . Restless leg   . Type 2 diabetes mellitus ([place])   . Uterine cancer ([place]) 1979   S/P hysterectomy  . Weakness    numbness and tingling in both feet r/t back       Psychiatric History:  No prior psychiatric  history.  Family Med/Psych History:  Family History  Problem Relation Age of Onset  . Diabetes Mother   . Dementia Mother   . Cirrhosis Mother        Non alcoholic  . COPD Father   . Diabetes Brother     Risk of Suicide/Violence: virtually non-existent Patient denies both suicidal and homicidal ideation.  Impression/DX:  Megan [name] is a 66 year old female with history of hypertension,remote tobacco abuse,diabetes mellitus, multiple prior thoracic or lumbar surgeriesand received inpatient rehabilitation services April 2018 after undergoing T9 through T12 decompression and fusion for a left T10-T11 herniated nucleus pulposus with severe stenosis.  Was still taking gabapentin for paresthesias, patient was completing outpatient therapy when she noted increasing left lower extremity weakness, she was evaluated by neurosurgery and on 06/05/2017 underwent thoracic and lumbar MRI which showed a new left L5-S1 protrusion compressing S1 nerve root.Admitted 07/26/2017 with progressive back pain and lower extremity weakness. X-rays and imaging revealed lumbosacral spondylosis with radiculopathy. Underwent L5-S1 anterior retroperitoneal exposure L5-S1 laminectomy, L5-S1 posterior lateral fixation with fusion 07/26/2017 per Dr.Ditty.  Patient has been coping with long/multiple hospital stays and dealing with extended rehab efforts.  The patient reports that she is doing fairly well from a mood standpoint and denies symptoms of anxiety or depression.  He has support from husband and is working hard in PT and OT.  The patient reports that she is now adapting well to ongoing therapeutic interventions.  Electronically Signed   _______________________ Ilean Skill, Psy.D.

## 2017-08-09 NOTE — Progress Notes (Signed)
Physical Therapy Session Note  Patient Details  Name: TENIOLA TSENG MRN: [id number] Date of Birth: Sep 20, 1950  Today's Date: 08/09/2017 PT Individual Time: 1515-1625 PT Individual Time Calculation (min): 70 min   Short Term Goals: Week 2:  PT Short Term Goal 1 (Week 2): Pt will ambulate 10' with LRAD and min assist PT Short Term Goal 2 (Week 2): Pt will demonstrate bed mobility with min assist  Skilled Therapeutic Interventions/Progress Updates:   Pt supine upon arrival and agreeable to therapy, no c/o pain. Transferred to EOB w/ Mod A and donned back brace w/ supervision. Transferred to w/c via squat pivot and pt self-propelled w/c around unit during session Mod I. Worked on sit<>stands in gym using RW. Back w/c against wall and PT in front and blocking RW for pt's comfort and anxiety about standing w/ RW. Performed 5 sit<>stands w/ supervision and verbal cues for technique. Occasional manual assist for R knee block 2/2 pt's fatigue in standing. Maintained static standing w/ UE support on RW in 30-60 sec bouts. Moderate increase in work of breathing w/ standing that resolves w/ rest. Pt reporting increase back pain in standing (8/10) and requesting to performed seated exercise for rest of session. Performed NuStep w/ LEs only for 10 min and 5 min @ L1 to facilitate reciprocal movement pattern in NWB position. Verbal and tactile cues for technique w/ keeping LEs in neutral alignment. Returned to room in w/c and transferred back to EOB and then supine w/ Mod A. Ended session in supine, call bell within reach and all needs met.   Therapy Documentation Precautions:  Precautions Precautions: Back, Fall Precaution Booklet Issued: Yes (comment) Precaution Comments: pt able to 3/3 back precautions Required Braces or Orthoses: Spinal Brace, Other Brace/Splint Spinal Brace: Lumbar corset, Applied in sitting position Other Brace/Splint: AFO RLE Restrictions Weight Bearing Restrictions: No Vital  Signs: Therapy Vitals Temp: 98.6 F (37 C) Temp Source: Oral Pulse Rate: 83 Resp: 18 BP: (!) 122/52 Patient Position (if appropriate): Lying Oxygen Therapy SpO2: 99 % O2 Device: Not Delivered  See Function Navigator for Current Functional Status.   Therapy/Group: Individual Therapy  Ahmad Vanwey K Arnette 08/09/2017, 5:46 PM

## 2017-08-09 NOTE — Plan of Care (Signed)
  Not Progressing SCI BOWEL ELIMINATION RH STG MANAGE BOWEL WITH ASSISTANCE Description STG Manage Bowel with Min Assistance  08/09/2017 1735 - Not Progressing by Marney Setting, RN Note Incontinent of bowel. Refusing bowel program/  SCI BLADDER ELIMINATION RH STG MANAGE BLADDER WITH ASSISTANCE Description STG Manage Bladder With Max Assistance  08/09/2017 1735 - Not Progressing by Marney Setting, RN Note Urinary retention. Requiring Q6-8 caths.

## 2017-08-09 NOTE — Progress Notes (Signed)
Occupational Therapy Session Note  Patient Details  Name: Megan [name] MRN: [id number] Date of Birth: 1950/10/29  Today's Date: 08/09/2017 OT Individual Time: 0900-1000 OT Individual Time Calculation (min): 60 min   Short Term Goals: Week 2:  OT Short Term Goal 1 (Week 2): Pt will sit to stand with MOD A of 1 wiht RW to decrease burden of care OT Short Term Goal 2 (Week 2): Pt will stand pivot transfer to [facility] with MAX A 1 caregiver OT Short Term Goal 3 (Week 2): Pt will complete 1/3 toileting steps to decrease burden of care. OT Short Term Goal 4 (Week 2): Pt will comlete LB dressing with min A used ADL AE as needed  Skilled Therapeutic Interventions/Progress Updates:    OT treatment session focused on modified bathing/dressing, sit<>stand, and transfers. Pt completed log roll to coe to sitting EOB with mod A. Pt bathed and dressed UB seated EOB with set-up A, and donned LSO at EOB.n LB bathing/dressing with ADL AE. Worked on increased hip flex/ext rotation for figure 4 position to don shoes. Pt able to don L shoe but still unable to tolerate donning R shoe/AFO. Verbal cues to try to utilize leg muscles to lift LE's into clothing instead of relying on UE's. Sit<>stand with Max A from raised bed and RW while OT assisted with pulling pants up over hips. Pt completed 3 more sit<>stands with Max A and R knee block and heavy use of UE's. Pt tolerated 45 seconds standing at longest bout with extended rest breaks in between 2/2 fatigue. Squat-pivot to wc on R with close supervision, then pt finished grooming tasks at the sink. Pt left with needs met.    Therapy Documentation Precautions:  Precautions Precautions: Back, Fall Precaution Booklet Issued: Yes (comment) Precaution Comments: pt able to 3/3 back precautions Required Braces or Orthoses: Spinal Brace, Other Brace/Splint Spinal Brace: Lumbar corset, Applied in sitting position Other Brace/Splint: AFO RLE Restrictions Weight Bearing  Restrictions: No Pain: Pain Assessment Pain Assessment: 0-10 Pain Score: 4 Pain Type: Surgical pain Pain Location: Back Pain Orientation: Lower Pain Descriptors / Indicators: Aching Pain Intervention(s): Repositioned  See Function Navigator for Current Functional Status.   Therapy/Group: Individual Therapy  [name] 08/09/2017, 9:26 AM

## 2017-08-10 ENCOUNTER — Inpatient Hospital Stay (HOSPITAL_COMMUNITY): Payer: BLUE CROSS/BLUE SHIELD | Admitting: Physical Therapy

## 2017-08-10 ENCOUNTER — Inpatient Hospital Stay (HOSPITAL_COMMUNITY): Payer: BLUE CROSS/BLUE SHIELD | Admitting: Occupational Therapy

## 2017-08-10 LAB — GLUCOSE, CAPILLARY
GLUCOSE-CAPILLARY: 139 mg/dL — AB (ref 65–99)
GLUCOSE-CAPILLARY: 114 mg/dL — AB (ref 65–99)
GLUCOSE-CAPILLARY: 126 mg/dL — AB (ref 65–99)

## 2017-08-10 LAB — URINE CULTURE: Culture: >=100000 — AB

## 2017-08-10 MED ORDER — BETHANECHOL CHLORIDE 10 MG PO TABS
5.0000 mg | ORAL_TABLET | Freq: Three times a day (TID) | ORAL | Status: DC
  Administered 2017-08-10 (×3): 5 mg via ORAL
  Filled 2017-08-10 (×3): qty 1

## 2017-08-10 MED ORDER — SULFAMETHOXAZOLE-TRIMETHOPRIM 800-160 MG PO TABS
1.0000 | ORAL_TABLET | Freq: Two times a day (BID) | ORAL | Status: AC
  Administered 2017-08-10 – 2017-08-13 (×6): 1 via ORAL
  Filled 2017-08-10 (×6): qty 1

## 2017-08-10 NOTE — Progress Notes (Signed)
Subjective/Complaints:  No issues overnite, had small void but still required cath last noc, no abd pain, pt doesn't remember episode of nausea yesterday Planning to have supp after therapy for constipation  ROS: pt denies nausea, vomiting, diarrhea, cough, shortness of breath or chest pain   Objective:  Vital Signs: Blood pressure 137/63, pulse 88, temperature 98.3 F (36.8 C), temperature source Oral, resp. rate 18, height 5' 8"  (1.727 m), weight 87 kg (191 lb 14.4 oz), SpO2 98 %. Dg Abd 1 View  Result Date: 08/09/2017 CLINICAL DATA:  Nausea and vomiting. EXAM: ABDOMEN - 1 VIEW COMPARISON:  07/26/2017.  10/27/2016.  06/21/2016. FINDINGS: Prior thoracolumbar and lumbosacral spine fusion. Hardware intact. Diffuse degenerative change lumbar spine with mild scoliosis concave right. Pelvic calcifications consistent with phleboliths . Sclerotic density right ilium again noted and consistent with bone island. IMPRESSION: 1. Prior thoracolumbar and lumbosacral spine fusion. Hardware intact. 2. Diffuse degenerative change with scoliosis concave right. No acute abnormality . Electronically Signed   By: Marcello Moores  Register   On: 08/09/2017 11:13   Results for orders placed or performed during the hospital encounter of 07/28/17 (from the past 72 hour(s))  Urine Culture     Status: Abnormal (Preliminary result)   Collection Time: 08/07/17 11:01 AM  Result Value Ref Range   Specimen Description URINE, CATHETERIZED    Special Requests NONE    Culture (A)     >=100,000 COLONIES/mL KLEBSIELLA PNEUMONIAE SUSCEPTIBILITIES TO FOLLOW    Report Status PENDING   Glucose, capillary     Status: Abnormal   Collection Time: 08/07/17 12:07 PM  Result Value Ref Range   Glucose-Capillary 121 (H) 65 - 99 mg/dL  Glucose, capillary     Status: Abnormal   Collection Time: 08/07/17  5:20 PM  Result Value Ref Range   Glucose-Capillary 144 (H) 65 - 99 mg/dL  Glucose, capillary     Status: Abnormal   Collection Time:  08/07/17  9:22 PM  Result Value Ref Range   Glucose-Capillary 123 (H) 65 - 99 mg/dL  Glucose, capillary     Status: Abnormal   Collection Time: 08/08/17  6:53 AM  Result Value Ref Range   Glucose-Capillary 129 (H) 65 - 99 mg/dL  Glucose, capillary     Status: Abnormal   Collection Time: 08/08/17 12:04 PM  Result Value Ref Range   Glucose-Capillary 137 (H) 65 - 99 mg/dL  Glucose, capillary     Status: Abnormal   Collection Time: 08/08/17  4:41 PM  Result Value Ref Range   Glucose-Capillary 110 (H) 65 - 99 mg/dL  Glucose, capillary     Status: Abnormal   Collection Time: 08/08/17  9:11 PM  Result Value Ref Range   Glucose-Capillary 136 (H) 65 - 99 mg/dL  Glucose, capillary     Status: Abnormal   Collection Time: 08/09/17  6:44 AM  Result Value Ref Range   Glucose-Capillary 120 (H) 65 - 99 mg/dL  Glucose, capillary     Status: None   Collection Time: 08/09/17 12:04 PM  Result Value Ref Range   Glucose-Capillary 96 65 - 99 mg/dL  Glucose, capillary     Status: Abnormal   Collection Time: 08/09/17  5:01 PM  Result Value Ref Range   Glucose-Capillary 108 (H) 65 - 99 mg/dL  Glucose, capillary     Status: Abnormal   Collection Time: 08/09/17  8:35 PM  Result Value Ref Range   Glucose-Capillary 168 (H) 65 - 99 mg/dL     HEENT:  Normocephalic. Atraumatic. Cardio: RRR without murmur. No JVD  Resp: CTA B/L and unlabored GI: BS positive and ND Skin:   Intact. Warm and dry. Urology: Urine slightly cloudy and yellow Neuro: Alert/Oriented Motor 5/5 in BUE RLE: 3-/5 HF, KE, ADF (limited still by spasticity 4-/5 Left HF, KE, ADF (limited by spasticity) Tone MAS 3 in R hamstring Musc/Skel:  No edema. No tenderness. Gen NAD. Vital signs reviewed.    Assessment/Plan: 1. 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 Physiatrist is providing close team supervision and 24 hour  management of active medical problems listed below. Physiatrist and rehab team continue to assess barriers to discharge/monitor patient progress toward functional and medical goals. FIM: Function - Bathing Position: Sitting EOB Body parts bathed by patient: Right arm, Left arm, Chest, Abdomen, Right upper leg, Left upper leg, Right lower leg, Left lower leg Body parts bathed by helper: Back Bathing not applicable: Buttocks, Front perineal area Assist Level: Touching or steadying assistance(Pt > 75%) Assistive Device Comment: LH sponge  Function- Upper Body Dressing/Undressing What is the patient wearing?: Pull over shirt/dress, Bra Bra - Perfomed by patient: Thread/unthread right bra strap, Hook/unhook bra (pull down sports bra), Thread/unthread left bra strap Bra - Perfomed by helper: Hook/unhook bra (pull down sports bra) Pull over shirt/dress - Perfomed by patient: Pull shirt over trunk, Thread/unthread left sleeve, Thread/unthread right sleeve, Put head through opening Button up shirt - Perfomed by patient: Thread/unthread right sleeve, Pull shirt around back, Button/unbutton shirt, Thread/unthread left sleeve Orthosis activity level: Performed by patient Assist Level: Set up Set up : To obtain clothing/put away Function - Lower Body Dressing/Undressing What is the patient wearing?: Pants, Socks, Shoes, AFO Position: Sitting EOB Pants- Performed by patient: Thread/unthread right pants leg, Pull pants up/down Pants- Performed by helper: Thread/unthread left pants leg Non-skid slipper socks- Performed by helper: Don/doff left sock, Don/doff right sock Socks - Performed by patient: Don/doff right sock, Don/doff left sock Socks - Performed by helper: Don/doff right sock Shoes - Performed by helper: Don/doff left shoe, Fasten left, Don/doff right shoe, Fasten right AFO - Performed by helper: Don/doff right AFO Assist for footwear: Partial/moderate assist Assist for lower body dressing:  Touching or steadying assistance (Pt > 75%)  Function - Toileting Toileting activity did not occur: No continent bowel/bladder event Toileting steps completed by helper: Adjust clothing after toileting, Performs perineal hygiene, Adjust clothing prior to toileting Assist level: Two helpers  Function - Air cabin crew transfer activity did not occur: Safety/medical concerns Toilet transfer assistive device: Drop arm commode, Elevated toilet seat/BSC over toilet, Grab bar Assist level to toilet: Touching or steadying assistance (Pt > 75%) Assist level from toilet: Touching or steadying assistance (Pt > 75%)  Function - Chair/bed transfer Chair/bed transfer method: Squat pivot Chair/bed transfer assist level: Touching or steadying assistance (Pt > 75%) Chair/bed transfer assistive device: Armrests Mechanical lift: Stedy Chair/bed transfer details: Verbal cues for precautions/safety, Verbal cues for safe use of DME/AE, Verbal cues for technique  Function - Locomotion: Wheelchair Will patient use wheelchair at discharge?: Yes Type: Manual Max wheelchair distance: 150' Assist Level: No help, No cues, assistive device, takes more than reasonable amount of time Assist Level: No help, No cues, assistive device, takes more than reasonable amount of time Assist Level: No help, No cues, assistive device, takes more than reasonable amount of time Turns around,maneuvers to table,bed, and toilet,negotiates 3% grade,maneuvers on rugs and  over doorsills: No Function - Locomotion: Ambulation Ambulation activity did not occur: Safety/medical concerns Assistive device: Clarise Cruz plus Max distance: 6' Assist level: Maximal assist (Pt 25 - 49%) Walk 10 feet on uneven surfaces activity did not occur: Safety/medical concerns  Function - Comprehension Comprehension: Auditory Comprehension assist level: Follows complex conversation/direction with extra time/assistive device  Function -  Expression Expression: Verbal Expression assist level: Expresses complex ideas: With extra time/assistive device  Function - Social Interaction Social Interaction assist level: Interacts appropriately with others with medication or extra time (anti-anxiety, antidepressant).  Function - Problem Solving Problem solving assist level: Solves basic problems with no assist  Function - Memory Memory assist level: Complete Independence: No helper Patient normally able to recall (first 3 days only): Current season, Location of own room, Staff names and faces, That he or she is in a hospital  Medical Problem List and Plan: 1.Decreased functional mobilitysecondary to lumbar spondylosis with radiculopathy. Status post L5-S1 anterior retroperitoneal exposure with laminectomy and fusion 07/26/2017 as well as history of multiple thoracic lumbar surgeries. Back brace when out of bed   Team conference today please see physician documentation under team conference tab, met with team face-to-face to discuss problems,progress, and goals. Formulized individual treatment plan based on medical history, underlying problem and comorbidities. 2. DVT Prophylaxis/Anticoagulation: SCDs.    Vascular study negative for DVT.  3. Pain Management:Celebrex 200 mg every 12 hours, Neurontin 300 mg 3 times a day, hydrocodone as needed  Baclofen changed back to Robaxin 1000 mg 4 times a day. She notes she has some improvement with this.  Encourage the use of heat as well as regular stretching.Robaxin likely helping with pain but no change in spasticity   Tizanidine qhs d/ced on 11/23 due to ?side effects.  4. Mood:Provide emotional support 5. Neuropsych: This patientiscapable of making decisions on herown behalf. 6. Skin/Wound Care:Routine skin checks 7. Fluids/Electrolytes/Nutrition:Routine I&O's   BMP within acceptable range on 11/22 8.Acute blood loss anemia.   Hb 9.6 on 11/22  Continue to monitor   9.Hypertension. Lisinopril 10 mg daily   Increased to 71m 11/20  Mild intermittent systolic elevation, no change in meds 11/27 Vitals:   08/09/17 1504 08/10/17 0513  BP: (!) 122/52 137/63  Pulse: 83 88  Resp: 18 18  Temp: 98.6 F (37 C) 98.3 F (36.8 C)  SpO2: 99% 98%   10.Diabetes mellitus peripheral neuropathy. Hemoglobin A1c 6.7. Glucophage 500 mg twice a day. Check blood sugars before meals and at bedtime CBG (last 3)   Recent Labs    08/09/17 1204 08/09/17 1701 08/09/17 2035  GLUCAP 96 108* 168*     Overall controlled 11/28 11.Constipation. Laxative assistance, increased on 11/18, increased again on 11/21 numerous bowel movements yesterday--.  Hold Senokot as per patient request   12.Remote tobacco abuse. Counseling 13. Hypoalbuminemia  Supplement initiated on 11/17 14.  Dysuria: Patient also with noted odor as well as cloudiness.      -Urinalysis is positive, urine culture +Kleb p, Started empiric Macrobid 11/24- afeb await sensitivity 15.  Urinary retention add flomax, start urecholine LOS (Days) 13 A FACE TO FACE EVALUATION WAS PERFORMED  ACharlett Blake11/28/2018, 7:46 AM

## 2017-08-10 NOTE — Progress Notes (Signed)
Physical Therapy Session Note  Patient Details  Name: Megan [name] MRN: [id number] Date of Birth: [birth year]  Today's Date: 08/10/2017 PT Individual Time: 1030-1100 and 1130-1200 PT Individual Time Calculation (min): 30 min and 30 minutes   Short Term Goals: Week 2:  PT Short Term Goal 1 (Week 2): Pt will ambulate 10' with LRAD and min assist PT Short Term Goal 2 (Week 2): Pt will demonstrate bed mobility with min assist  Skilled Therapeutic Interventions/Progress Updates:  Session 1: no c/o pain.  Pt participated in power mobility consult with ATP.  Supervision for squat/pivot to and from therapy mat with supervision.  Pt able to tolerate sitting on therapy mat x15 minutes unsupported with supervision.  Returned to room in w/c mod I.     Session 2: no c/o pain.  Session focus on sit<>stand transfers from w/c with RW.  Pt able to transition sit<>stand with min guard>supervision with verbal cues for power up, walking LEs back, and then bringing UEs to RW one at a time.  Pt able to complete 4 sit<>Stands in this manner and in standing complete marching and minisquats for pre-gait.  Pt declined progressing to ambulation at this time.    Therapy Documentation Precautions:  Precautions Precautions: Back, Fall Precaution Booklet Issued: Yes (comment) Precaution Comments: pt able to 3/3 back precautions Required Braces or Orthoses: Spinal Brace, Other Brace/Splint Spinal Brace: Lumbar corset, Applied in sitting position Other Brace/Splint: AFO RLE Restrictions Weight Bearing Restrictions: No   See Function Navigator for Current Functional Status.   Therapy/Group: Individual Therapy  Michel Santee 08/10/2017, 12:38 PM

## 2017-08-10 NOTE — Progress Notes (Signed)
Physical Therapy Session Note  Patient Details  Name: Megan [name] MRN: [id number] Date of Birth: [birth year]  Today's Date: 08/10/2017 PT Individual Time: 1130-1200 PT Individual Time Calculation (min): 30 min   Short Term Goals: Week 2:  PT Short Term Goal 1 (Week 2): Pt will ambulate 10' with LRAD and min assist PT Short Term Goal 2 (Week 2): Pt will demonstrate bed mobility with min assist  Skilled Therapeutic Interventions/Progress Updates:    no c/o pain at rest, but reporting fatigue.  Session focus on transfers, standing tolerance, and gait.    Pt transfers sit<>stand throughout session with RW and supervision to min guard.  Gait x5' +2' +3' with RW and min guard, extended seated rest breaks between trials 2/2 fatigue.  Seated hip flexion for strengthening bilateral 2 trials to fatigue.  Standing balance focus on single UE support with LUE task, min guard for balance. Pt completes graded clothespin tasks x3 trials in standing to fatigue.  Pt propelled w/c back to room, supervision for squat/pivot back to bed and mod assist lift LEs into bed.  Call bell in reach and needs met.   Therapy Documentation Precautions:  Precautions Precautions: Back, Fall Precaution Booklet Issued: Yes (comment) Precaution Comments: pt able to 3/3 back precautions Required Braces or Orthoses: Spinal Brace, Other Brace/Splint Spinal Brace: Lumbar corset, Applied in sitting position Other Brace/Splint: AFO RLE Restrictions Weight Bearing Restrictions: No   See Function Navigator for Current Functional Status.   Therapy/Group: Individual Therapy  Michel Santee 08/10/2017, 2:58 PM

## 2017-08-10 NOTE — Progress Notes (Signed)
Patient up to attempt void, unable to void.  Patient's spouse cathed patient using sterile technique but educated about clean technique for home. Spouse able to insert catheter on second attempt. Needed support for draining. 2 RNs present. Spouse verbalized confidence in procedure and will continue practicing as he is available. Patient tolerated procedure comfortably.

## 2017-08-10 NOTE — Progress Notes (Signed)
Occupational Therapy Session Note  Patient Details  Name: Megan [name] MRN: [id number] Date of Birth: Jul 01, 1951  Today's Date: 08/10/2017 OT Individual Time: 4315-4008 OT Individual Time Calculation (min): 58 min   Short Term Goals: Week 2:  OT Short Term Goal 1 (Week 2): Pt will sit to stand with MOD A of 1 wiht RW to decrease burden of care OT Short Term Goal 2 (Week 2): Pt will stand pivot transfer to [facility] with MAX A 1 caregiver OT Short Term Goal 3 (Week 2): Pt will complete 1/3 toileting steps to decrease burden of care. OT Short Term Goal 4 (Week 2): Pt will comlete LB dressing with min A used ADL AE as needed  Skilled Therapeutic Interventions/Progress Updates:    OT treatment session focused on modified bathing/dressing, sit<>stand, and activity tolerance. Pt came to sitting EOB with assistance to advance R LE and min A to elevate trunk. Pt tolerated sitting EOB for ~ 5 mins with increased pain up to an 8.Pt had to return to supine for pain and fatigue. Pt rested in supine for ~5 mins, then came to sitting EOB using log roll in similar fashion as before. LSO donned with set-up, then pt transferred to wc with supervision squat-pivot for added back support while bathing/dressing. Worked on LB dressing techniques using long-handled AE. Pt continues to need assist to don R shoe and AFO 2/2 difficulty lifting R LE into shoe. Sit<>stand from wc with max A and R knee block with heavy use of UE's. OT assisted with pulling pants over hips. Pt left in wc at the sink to brush teeth and comb hair.   Therapy Documentation Precautions:  Precautions Precautions: Back, Fall Precaution Booklet Issued: Yes (comment) Precaution Comments: pt able to 3/3 back precautions Required Braces or Orthoses: Spinal Brace, Other Brace/Splint Spinal Brace: Lumbar corset, Applied in sitting position Other Brace/Splint: AFO RLE Restrictions Weight Bearing Restrictions: No Pain: Pain Assessment Pain  Assessment: 0-10 Pain Score: 8  Pain Type: Surgical pain Pain Location: Back Pain Orientation: Lower Pain Descriptors / Indicators: Aching Pain Onset: With Activity Pain Intervention(s): Repositioned  See Function Navigator for Current Functional Status.   Therapy/Group: Individual Therapy  [name] 08/10/2017, 10:00 AM

## 2017-08-10 NOTE — Plan of Care (Signed)
  RH SKIN INTEGRITY RH STG MAINTAIN SKIN INTEGRITY WITH ASSISTANCE Description STG Maintain Skin Integrity With Assistance. Min  08/10/2017 0120 - Progressing by Blinda Leatherwood, RN   RH PAIN MANAGEMENT RH STG PAIN MANAGED AT OR BELOW PT'S PAIN GOAL Description Less than 4  08/10/2017 0120 - Progressing by Blinda Leatherwood, RN

## 2017-08-10 NOTE — Patient Care Conference (Addendum)
Inpatient RehabilitationTeam Conference and Plan of Care Update Date: 08/10/2017   Time: 11:05 AM    Patient Name: Megan [name]      Medical Record Number: [id number]  Date of Birth: 06-25-1951 Sex: Female         Room/Bed: 4M12C/4M12C-01 Payor Info: Payor: [place] / Plan: [place] / Product Type: *No Product type* /    Admitting Diagnosis: lumbosacral fusion  Admit Date/Time:  07/28/2017  3:33 PM Admission Comments: No comment available   Primary Diagnosis:  <principal problem not specified> Principal Problem: <principal problem not specified>  Patient Active Problem List   Diagnosis Date Noted  . Constipation due to pain medication   . Muscle spasms of both lower extremities   . Type 2 diabetes mellitus with peripheral neuropathy (HCC)   . Hypoalbuminemia due to protein-calorie malnutrition ([place])   . Radiculopathy 07/28/2017  . Fusion of spine, lumbosacral region   . Acute blood loss anemia   . Benign essential HTN   . Diabetes mellitus type 2 in obese ([place])   . Lumbosacral spondylosis with radiculopathy 07/26/2017  . Sensation of pressure in bladder area 01/31/2017  . Paraplegia, incomplete ([place]) 01/07/2017  . Neurogenic bladder 12/27/2016  . 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/27/2016  . Myelopathy ([place]) 12/21/2016  . Surgery, elective   . Diabetes mellitus type 2 in nonobese (HCC)   . History of lumbar fusion   . History of fusion of cervical spine   . Neuropathic pain   . Post-operative pain   . 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 Paraparesis ([place]) 12/15/2016  . Pedal edema 12/14/2016  . Type 2 diabetes mellitus without complication, without long-term current use of insulin ([place]) 11/02/2016  . Hyperlipidemia 11/02/2016  . Essential hypertension 11/02/2016  . 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    Expected Discharge Date: Expected Discharge Date:  08/19/17  Team Members Present:  Dr. Alysia Penna; Benjie Karvonen, RN; Shann Medal, PT; Cherylynn Ridges, OT; Weston Anna, ST; Daiva Nakayama, RN, [facility]       Current Status/Progress Goal Weekly Team Focus  Medical   Had pre-existing right-sided lower extremity weakness secondary to thoracic myelopathy, now has left lower extremity weakness secondary to lumbar radiculopathy.  Has neurogenic bladder.  Neurogenic bowel  Incontinence of bowel and bladder, reduce spasticity  Reestablish bowel program with nursing assistance   Bowel/Bladder   requiring I&O caths,  starting urecholine and void attenpts OOB, refused some bowel program, re-educated  able to void, maintain regular bowel pattern  educate daughter and spouse on I&O caths if needed, patient agrees to regular suppository   Swallow/Nutrition/ Hydration             ADL's   Improved pain and spasms, Max A sit<>stand, supervision squat-pivot, min/mod A LB ADL, Max A toileting  Min A/supervision  modified bathing/dressing, LB dressing strategies, LB/UB strengthening, activity tolerance   Mobility   mod/max for sit<>stand from w/c with RW  supervision overall  sit<>stands, gait, power w/c eval on 11/28   Communication             Safety/Cognition/ Behavioral Observations            Pain   no pain  no pain      Skin   abdominal incision with honeycomb dressing/back incision OTA, Clean, dry  no new skin issues  assess skin q shift and prn    Rehab Goals Patient  on target to meet rehab goals: Yes Rehab Goals Revised: none *See Care Plan and progress notes for long and short-term goals.     Barriers to Discharge  Current Status/Progress Possible Resolutions Date Resolved   Physician    Medical stability;Other (comments);Neurogenic Bowel & Bladder  Fever falls  Adjusting medications for bladder retention  Continue rehabilitation and medication management as above      Nursing                  PT                    OT                   SLP                SW                Discharge Planning/Teaching Needs:  Patient's daughter, husband, neighbor, and friend to assist pt at home at min A level.  family routinely participate in care, but will come for family education as we ask   Team Discussion:  Pt with urinary retention and UTI with Dr. Letta Pate starting new medications to assist with this.  He has also asked nursing staff to get pt up to the commode as much as possible to assist with this.  Spasms are somewhat better.  Pt may need in and out cath at home, so husband is coming today for training with the nurse.  Pt is doing better with therapies now that her pain is better and she is staying out of bed more.  Pt is mod A to stand.  Pt's right knee buckles.  OT asked if pt can shower now and Dr. Letta Pate said she can.  Pt has trouble standing/walking functionally and PT is wondering if pt is fearful of falling.  Power wheelchair evaluation occurred today.  Revisions to Treatment Plan:  none    Continued Need for Acute Rehabilitation Level of Care: The patient requires daily medical management by a physician with specialized training in physical medicine and rehabilitation for the following conditions: Daily direction of a multidisciplinary physical rehabilitation program to ensure safe treatment while eliciting the highest outcome that is of practical value to the patient.: Yes Daily medical management of patient stability for increased activity during participation in an intensive rehabilitation regime.: Yes Daily analysis of laboratory values and/or radiology reports with any subsequent need for medication adjustment of medical intervention for : Neurological problems;Urological problems  Airlie Blumenberg, Silvestre Mesi 08/10/2017, 1:43 PM

## 2017-08-11 ENCOUNTER — Inpatient Hospital Stay (HOSPITAL_COMMUNITY): Payer: BLUE CROSS/BLUE SHIELD | Admitting: Physical Therapy

## 2017-08-11 ENCOUNTER — Inpatient Hospital Stay (HOSPITAL_COMMUNITY): Payer: BLUE CROSS/BLUE SHIELD | Admitting: Occupational Therapy

## 2017-08-11 DIAGNOSIS — N312 Flaccid neuropathic bladder, not elsewhere classified: Secondary | ICD-10-CM

## 2017-08-11 DIAGNOSIS — M4714 Other spondylosis with myelopathy, thoracic region: Secondary | ICD-10-CM

## 2017-08-11 LAB — GLUCOSE, CAPILLARY
GLUCOSE-CAPILLARY: 117 mg/dL — AB (ref 65–99)
Glucose-Capillary: 150 mg/dL — ABNORMAL HIGH (ref 65–99)
Glucose-Capillary: 102 mg/dL — ABNORMAL HIGH (ref 65–99)
Glucose-Capillary: 132 mg/dL — ABNORMAL HIGH (ref 65–99)

## 2017-08-11 MED ORDER — BETHANECHOL CHLORIDE 10 MG PO TABS
10.0000 mg | ORAL_TABLET | Freq: Three times a day (TID) | ORAL | Status: DC
  Administered 2017-08-11 – 2017-08-12 (×6): 10 mg via ORAL
  Filled 2017-08-11 (×8): qty 1

## 2017-08-11 NOTE — Plan of Care (Addendum)
  SCI BLADDER ELIMINATION RH STG MANAGE BLADDER WITH ASSISTANCE Description STG Manage Bladder With Max Assistance  08/11/2017 0016 - Progressing by Blinda Leatherwood, RN   SCI BLADDER ELIMINATION RH STG MANAGE BLADDER WITH EQUIPMENT WITH ASSISTANCE Description STG Manage Bladder With Equipment With Max Assistance  08/11/2017 0016 - Progressing by Lysbeth Penner D, RN  Educating pt and family on the importance of in and out cath, time appropriateness when to empty the bladder.  SCI BOWEL ELIMINATION RH STG MANAGE BOWEL WITH ASSISTANCE Description STG Manage Bowel with Min Assistance  08/11/2017 0016 - Progressing by Blinda Leatherwood, RN   SCI BOWEL ELIMINATION RH STG SCI MANAGE BOWEL WITH MEDICATION WITH ASSISTANCE Description STG SCI Manage bowel with medication with Min assistance  08/11/2017 0016 - Progressing by Blinda Leatherwood, RN   SCI BOWEL ELIMINATION RH STG SCI MANAGE BOWEL PROGRAM W/ASSIST OR AS APPROPRIATE Description STG SCI Manage bowel program total assist  08/11/2017 0016 - Progressing by Blinda Leatherwood, RN  Deficit of knowledge the importance bowel program elimination. Staff have a continuous approach of the bowel program with pt.

## 2017-08-11 NOTE — Progress Notes (Signed)
Patient would like suppository after therapy

## 2017-08-11 NOTE — Progress Notes (Signed)
Social Work Patient ID: Megan [name], female   DOB: December 09, 1950, 66 y.o.   MRN: [id number]   CSW met with pt 08-10-17 to update her on team conference discussion and targeted d/c date of 08-19-17.  Pt feels she will be ready by the end of next week.  She discussed being evaluated for motorized w/c and that her husband was coming today to learn how to in and out cath her.  She is hopeful she will still be able to void on her own by 08-19-17, but is glad he is learning, just in case.  CSW spoke with pt's husband via telephone to update him, as well.  He was confused as to if we were getting pt an motorized w/c or not.  CSW explained that per team conference discussion, PT feels it would give pt more independence, but Dr. Letta Pate does not want her to rely on it, so they are working together and seeing pt's progress before making a final decision.  Husband was accepting of this, but wondered why things were changing day to day on w/c.  CSW will continue to follow and assist as needed.

## 2017-08-11 NOTE — Progress Notes (Signed)
Occupational Therapy Session Note  Patient Details  Name: Megan [name] MRN: [id number] Date of Birth: [birth year]  Today's Date: 08/11/2017  Session 1 OT Individual Time: 0900-1000 OT Individual Time Calculation (min): 60 min   Session 2 OT Individual Time: 1330-1440 OT Individual Time Calculation (min): 70 min    Short Term Goals: Week 2:  OT Short Term Goal 1 (Week 2): Pt will sit to stand with MOD A of 1 wiht RW to decrease burden of care OT Short Term Goal 2 (Week 2): Pt will stand pivot transfer to [facility] with MAX A 1 caregiver OT Short Term Goal 3 (Week 2): Pt will complete 1/3 toileting steps to decrease burden of care. OT Short Term Goal 4 (Week 2): Pt will comlete LB dressing with min A used ADL AE as needed  Skilled Therapeutic Interventions/Progress Updates:  Session 1   OT treatment session focused on functional shower transfers, modified bathing/dressing, and maintaining back precautions. LSO donned at EOB, then pt completed squat-pivot to bed>wc with close supervision. Lateral scoot to tub bench with Min A 2/2 difficulty maintaining foot traction on tile floor. LSO removed once sitting and bathing completed using long-handled sponge and leaning method to wash buttocks.  R LE spasms are more often today, which are very fatiguing for pt-she required more rest breaks than usual to complete BADL tasks. UB dressing completed while still seated on tub bench, then LSO donned.  Squat-pivot out of shower with Min A to clear higher wc cushion. OT assisted with threading pant legs and donning shoes/AFO  For time management prior to sit<>stand. Sit<>stand with min A using RW while OT then put on brief and pulled up pants-pt tolerated standing for ~1 minute. Pt returned to sitting and left seated in wc at the sink finishing grooming tasks.   Session 2 Pt greeted sitting in wc and agreeable to OT treatment session focused on B UE strengthening, improved sit<>stand, and standing endurance.  Pt propelled wc to day room without rest break. Maneuvered wc around chairs to get to standing frame. Pt completed 2 sit<>stands with close supervision and tolerated standing for ~45 seconds. Pt then reported feeling dizzy and nauseous after standing. BP checked in sitting, 110/49 HR (64). Pt completed 4th stand for orthostatic vitals and BP standing  91/37 (48).  Pt propelled wc back to room and was able to set-up wc to transfer with min verbal cues to locate mechanism to swing out leg rests. Squat-pivot back to bed with supervision. Assistance needed to lift B LE's into bed. BP taken after laying supine for ~5 mins 107/42 (57). Nursing notified of pt status.  Therapy Documentation Precautions:  Precautions Precautions: Back, Fall Precaution Booklet Issued: Yes (comment) Precaution Comments: pt able to 3/3 back precautions Required Braces or Orthoses: Spinal Brace, Other Brace/Splint Spinal Brace: Lumbar corset, Applied in sitting position Other Brace/Splint: AFO RLE Restrictions Weight Bearing Restrictions: No Pain:  none/denies pain  See Function Navigator for Current Functional Status.   Therapy/Group: Individual Therapy  [name] 08/11/2017, 2:47 PM

## 2017-08-11 NOTE — Progress Notes (Signed)
Physical Therapy Session Note  Patient Details  Name: Megan [name] MRN: [id number] Date of Birth: 1950/10/05  Today's Date: 08/11/2017 PT Individual Time: 1100-1200 PT Individual Time Calculation (min): 60 min   Short Term Goals: Week 2:  PT Short Term Goal 1 (Week 2): Pt will ambulate 10' with LRAD and min assist PT Short Term Goal 2 (Week 2): Pt will demonstrate bed mobility with min assist  Skilled Therapeutic Interventions/Progress Updates:    no c/o pain at rest, but increased spasticity noted in RLE today with weight bearing positions. Session focus on LE strengthening and standing tolerance.    Pt propels w/c with BUEs throughout unit mod I.  Squat/pivot to and from nustep with supervision.  Nustep x10 minutes LEs only at level 4 for strengthening and activity tolerance with min verbal cues for full RLE extension and neutral RLE rotation.  Sit<>stand x3 with focus on standing tolerance and single UE support for table top activity.  Pt only able to tolerate standing 45-60 seconds with heavy reliance on UEs, RLE spasticity ongoing in standing.    Extensive discussion and education regarding using strength to lift LEs whenever possible, rather than picking LEs up with UEs, and continuing to strengthening when not in therapy with AROM therex.  Discussed pros/cons of power mobility versus manual mobility.    Pt returned to room at end of session, left upright in w/c with call bell in reach.   Therapy Documentation Precautions:  Precautions Precautions: Back, Fall Precaution Booklet Issued: Yes (comment) Precaution Comments: pt able to 3/3 back precautions Required Braces or Orthoses: Spinal Brace, Other Brace/Splint Spinal Brace: Lumbar corset, Applied in sitting position Other Brace/Splint: AFO RLE Restrictions Weight Bearing Restrictions: No   See Function Navigator for Current Functional Status.   Therapy/Group: Individual Therapy  Michel Santee 08/11/2017, 12:07  PM

## 2017-08-11 NOTE — Progress Notes (Signed)
Subjective/Complaints: Discussed mobility which is at Sup for transfers, amb min G svery short distance discussed goals of sup amb-do not see need for electric WC Required cath Good BM yesterday discussed need for bowel program  ROS: pt denies nausea, vomiting, diarrhea, cough, shortness of breath or chest pain   Objective:  Vital Signs: Blood pressure 137/62, pulse 83, temperature 98.3 F (36.8 C), temperature source Oral, resp. rate 18, height 5\' 8"  (1.727 m), weight 87 kg (191 lb 14.4 oz), SpO2 97 %. Dg Abd 1 View  Result Date: 08/09/2017 CLINICAL DATA:  Nausea and vomiting. EXAM: ABDOMEN - 1 VIEW COMPARISON:  07/26/2017.  10/27/2016.  06/21/2016. FINDINGS: Prior thoracolumbar and lumbosacral spine fusion. Hardware intact. Diffuse degenerative change lumbar spine with mild scoliosis concave right. Pelvic calcifications consistent with phleboliths . Sclerotic density right ilium again noted and consistent with bone island. IMPRESSION: 1. Prior thoracolumbar and lumbosacral spine fusion. Hardware intact. 2. Diffuse degenerative change with scoliosis concave right. No acute abnormality . Electronically Signed   By: Marcello Moores  Register   On: 08/09/2017 11:13   Results for orders placed or performed during the hospital encounter of 07/28/17 (from the past 72 hour(s))  Glucose, capillary     Status: Abnormal   Collection Time: 08/08/17 12:04 PM  Result Value Ref Range   Glucose-Capillary 137 (H) 65 - 99 mg/dL  Glucose, capillary     Status: Abnormal   Collection Time: 08/08/17  4:41 PM  Result Value Ref Range   Glucose-Capillary 110 (H) 65 - 99 mg/dL  Glucose, capillary     Status: Abnormal   Collection Time: 08/08/17  9:11 PM  Result Value Ref Range   Glucose-Capillary 136 (H) 65 - 99 mg/dL  Glucose, capillary     Status: Abnormal   Collection Time: 08/09/17  6:44 AM  Result Value Ref Range   Glucose-Capillary 120 (H) 65 - 99 mg/dL  Glucose, capillary     Status: None   Collection  Time: 08/09/17 12:04 PM  Result Value Ref Range   Glucose-Capillary 96 65 - 99 mg/dL  Glucose, capillary     Status: Abnormal   Collection Time: 08/09/17  5:01 PM  Result Value Ref Range   Glucose-Capillary 108 (H) 65 - 99 mg/dL  Glucose, capillary     Status: Abnormal   Collection Time: 08/09/17  8:35 PM  Result Value Ref Range   Glucose-Capillary 168 (H) 65 - 99 mg/dL  Glucose, capillary     Status: Abnormal   Collection Time: 08/10/17 11:22 AM  Result Value Ref Range   Glucose-Capillary 139 (H) 65 - 99 mg/dL  Glucose, capillary     Status: Abnormal   Collection Time: 08/10/17  5:00 PM  Result Value Ref Range   Glucose-Capillary 114 (H) 65 - 99 mg/dL  Glucose, capillary     Status: Abnormal   Collection Time: 08/10/17  8:24 PM  Result Value Ref Range   Glucose-Capillary 126 (H) 65 - 99 mg/dL  Glucose, capillary     Status: Abnormal   Collection Time: 08/11/17  7:07 AM  Result Value Ref Range   Glucose-Capillary 117 (H) 65 - 99 mg/dL     HEENT: Normocephalic. Atraumatic. Cardio: RRR without murmur. No JVD  Resp: CTA B/L and unlabored GI: BS positive and ND Skin:   Intact. Warm and dry. Urology: Urine slightly cloudy and yellow Neuro: Alert/Oriented Motor 5/5 in BUE RLE: 3-/5 HF, KE, ADF (limited still by spasticity 4-/5 Left HF, KE, ADF (limited by  spasticity) Tone MAS 3 in R hamstring Musc/Skel:  No edema. No tenderness. Gen NAD. Vital signs reviewed.    Assessment/Plan: 1. 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 Physiatrist is providing close team supervision and 24 hour management of active medical problems listed below. Physiatrist and rehab team continue to assess barriers to discharge/monitor patient progress toward functional and medical goals. FIM: Function - Bathing Position: Wheelchair/chair at sink Body parts bathed by patient: Right arm, Left  arm, Chest, Abdomen, Right upper leg, Left upper leg, Right lower leg, Left lower leg Body parts bathed by helper: Front perineal area, Buttocks, Back Bathing not applicable: Buttocks, Front perineal area Assist Level: Touching or steadying assistance(Pt > 75%) Assistive Device Comment: LH sponge  Function- Upper Body Dressing/Undressing What is the patient wearing?: Pull over shirt/dress, Bra Bra - Perfomed by patient: Thread/unthread right bra strap, Hook/unhook bra (pull down sports bra), Thread/unthread left bra strap Bra - Perfomed by helper: Hook/unhook bra (pull down sports bra) Pull over shirt/dress - Perfomed by patient: Thread/unthread right sleeve, Thread/unthread left sleeve, Put head through opening, Pull shirt over trunk Button up shirt - Perfomed by patient: Thread/unthread right sleeve, Pull shirt around back, Button/unbutton shirt, Thread/unthread left sleeve Orthosis activity level: Performed by patient Assist Level: Set up Set up : To obtain clothing/put away Function - Lower Body Dressing/Undressing What is the patient wearing?: Pants, Socks, AFO, Shoes Position: Wheelchair/chair at sink Pants- Performed by patient: Thread/unthread right pants leg, Thread/unthread left pants leg Pants- Performed by helper: Pull pants up/down Non-skid slipper socks- Performed by helper: Don/doff left sock, Don/doff right sock Socks - Performed by patient: Don/doff right sock, Don/doff left sock Socks - Performed by helper: Don/doff right sock Shoes - Performed by patient: Don/doff left shoe, Fasten left Shoes - Performed by helper: Fasten right, Don/doff right shoe AFO - Performed by helper: Don/doff right AFO Assist for footwear: Partial/moderate assist Assist for lower body dressing: Touching or steadying assistance (Pt > 75%)  Function - Toileting Toileting activity did not occur: No continent bowel/bladder event Toileting steps completed by helper: Adjust clothing prior to  toileting, Performs perineal hygiene, Adjust clothing after toileting Toileting Assistive Devices: Grab bar or rail Assist level: Two helpers  Function - Air cabin crew transfer activity did not occur: Safety/medical concerns Toilet transfer assistive device: Elevated toilet seat/BSC over toilet, Mechanical lift Mechanical lift: Stedy(patient preference) Assist level to toilet: Moderate assist (Pt 50 - 74%/lift or lower) Assist level from toilet: Moderate assist (Pt 50 - 74%/lift or lower)  Function - Chair/bed transfer Chair/bed transfer method: Squat pivot Chair/bed transfer assist level: Touching or steadying assistance (Pt > 75%) Chair/bed transfer assistive device: Armrests Mechanical lift: Stedy Chair/bed transfer details: Verbal cues for precautions/safety, Verbal cues for safe use of DME/AE, Verbal cues for technique  Function - Locomotion: Wheelchair Will patient use wheelchair at discharge?: Yes Type: Manual Max wheelchair distance: 150' Assist Level: No help, No cues, assistive device, takes more than reasonable amount of time Assist Level: No help, No cues, assistive device, takes more than reasonable amount of time Assist Level: No help, No cues, assistive device, takes more than reasonable amount of time Turns around,maneuvers to table,bed, and toilet,negotiates 3% grade,maneuvers on rugs and over doorsills: No Function - Locomotion: Ambulation Ambulation activity did not occur: Safety/medical concerns Assistive device: Clarise Cruz plus Max distance: 6' Assist level: Maximal assist (Pt 25 - 49%) Walk 10 feet on  uneven surfaces activity did not occur: Safety/medical concerns  Function - Comprehension Comprehension: Auditory Comprehension assist level: Follows complex conversation/direction with extra time/assistive device  Function - Expression Expression: Verbal Expression assist level: Expresses complex ideas: With extra time/assistive device  Function -  Social Interaction Social Interaction assist level: Interacts appropriately with others with medication or extra time (anti-anxiety, antidepressant).  Function - Problem Solving Problem solving assist level: Solves basic problems with no assist  Function - Memory Memory assist level: Complete Independence: No helper Patient normally able to recall (first 3 days only): Current season, Location of own room, Staff names and faces, That he or she is in a hospital  Medical Problem List and Plan: 1.Decreased functional mobilitysecondary to lumbar spondylosis with radiculopathy. Status post L5-S1 anterior retroperitoneal exposure with laminectomy and fusion 07/26/2017 as well as history of multiple thoracic lumbar surgeries. Back brace when out of bed   CIR PT, OT- discussed favorable prognosis for amb- RLE strength will still improve , LLE should get back to near normal 2. DVT Prophylaxis/Anticoagulation: SCDs.    Vascular study negative for DVT.  3. Pain Management:Celebrex 200 mg every 12 hours, Neurontin 300 mg 3 times a day, hydrocodone as needed  Baclofen changed back to Robaxin 1000 mg 4 times a day. She notes she has some improvement with this.  Encourage the use of heat as well as regular stretching.Robaxin likely helping with pain but no change in spasticity   Tizanidine qhs d/ced on 11/23 due to ?side effects.  4. Mood:Provide emotional support 5. Neuropsych: This patientiscapable of making decisions on herown behalf. 6. Skin/Wound Care:Routine skin checks 7. Fluids/Electrolytes/Nutrition:Routine I&O's   BMP within acceptable range on 11/22 8.Acute blood loss anemia.   Hb 9.6 on 11/22  Continue to monitor  9.Hypertension. Lisinopril 10 mg daily   Increased to 20mg  11/20  Mild intermittent systolic elevation, no change in meds 11/29 Vitals:   08/10/17 1555 08/11/17 0628  BP: 126/61 137/62  Pulse: 75 83  Resp: 16 18  Temp: 98.1 F (36.7 C) 98.3 F (36.8 C)  SpO2:  100% 97%   10.Diabetes mellitus peripheral neuropathy. Hemoglobin A1c 6.7. Glucophage 500 mg twice a day. Check blood sugars before meals and at bedtime CBG (last 3)   Recent Labs    08/10/17 1700 08/10/17 2024 08/11/17 0707  GLUCAP 114* 126* 117*      controlled 11/29 11.Constipation. Laxative assistance, increased on 11/18, increased again on 11/21 numerous bowel movements yesterday--.  Hold Senokot as per patient request   12.Remote tobacco abuse. Counseling 13. Hypoalbuminemia  Supplement initiated on 11/17 14.  Dysuria: Patient also with noted odor as well as cloudiness.      -Urinalysis is positive, urine culture +Kleb p, Started empiric Macrobid 11/24- afeb await sensitivity 15.  Urinary retention add flomax, increase urecholine LOS (Days) 14 A FACE TO FACE EVALUATION WAS PERFORMED  Charlett Blake 08/11/2017, 7:58 AM

## 2017-08-12 ENCOUNTER — Inpatient Hospital Stay (HOSPITAL_COMMUNITY): Payer: BLUE CROSS/BLUE SHIELD | Admitting: Occupational Therapy

## 2017-08-12 ENCOUNTER — Inpatient Hospital Stay (HOSPITAL_COMMUNITY): Payer: BLUE CROSS/BLUE SHIELD | Admitting: Physical Therapy

## 2017-08-12 ENCOUNTER — Inpatient Hospital Stay (HOSPITAL_COMMUNITY): Payer: BLUE CROSS/BLUE SHIELD

## 2017-08-12 LAB — BASIC METABOLIC PANEL
Anion gap: 8 (ref 5–15)
BUN: 13 mg/dL (ref 6–20)
CALCIUM: 8.9 mg/dL (ref 8.9–10.3)
CO2: 23 mmol/L (ref 22–32)
CREATININE: 1.06 mg/dL — AB (ref 0.44–1.00)
Chloride: 101 mmol/L (ref 101–111)
GFR calc Af Amer: >60 mL/min (ref >60)
GFR calc non Af Amer: 54 mL/min — ABNORMAL LOW (ref >60)
GLUCOSE: 169 mg/dL — AB (ref 65–99)
Potassium: 5 mmol/L (ref 3.5–5.1)
Sodium: 132 mmol/L — ABNORMAL LOW (ref 135–145)

## 2017-08-12 LAB — GLUCOSE, CAPILLARY
Glucose-Capillary: 133 mg/dL — ABNORMAL HIGH (ref 65–99)
Glucose-Capillary: 121 mg/dL — ABNORMAL HIGH (ref 65–99)
Glucose-Capillary: 104 mg/dL — ABNORMAL HIGH (ref 65–99)
Glucose-Capillary: 145 mg/dL — ABNORMAL HIGH (ref 65–99)

## 2017-08-12 NOTE — Progress Notes (Signed)
Occupational Therapy Session Note  Patient Details  Name: Megan [name] MRN: [id number] Date of Birth: 25-Nov-1950  Today's Date: 08/12/2017 OT Individual Time: 6435-3912 OT Individual Time Calculation (min): 27 min    Short Term Goals: Week 1:  OT Short Term Goal 1 (Week 1): Pt will sit to stand with MOD A of 1 wiht RW to decrease burden of care OT Short Term Goal 1 - Progress (Week 1): Progressing toward goal OT Short Term Goal 2 (Week 1): Pt will stand pivot transfer to [facility] with MAX A 1 caregiver OT Short Term Goal 2 - Progress (Week 1): Progressing toward goal OT Short Term Goal 3 (Week 1): Pt will don footwear with AE PRN OT Short Term Goal 3 - Progress (Week 1): Progressing toward goal OT Short Term Goal 4 (Week 1): Pt will bathe LB wiht MIN A PRN OT Short Term Goal 4 - Progress (Week 1): Met  Skilled Therapeutic Interventions/Progress Updates:    1;1. Pt supine>sitting with A to move RLE off bed. Pt lateral scoot transfer througout session with supervision and Vc for locking brakes EOB<>w/c<>EOM. Seated EOM pt scoots laterally length of mat with forced LE management with VC for "marching" RLE up/over. Pt completes 2x30 ball tosses with 1# wrist weights for BUE endurance (chest, bounce and overhead pass). Exited session with pt supine in bed with call light in reach and sister in law in room  Therapy Documentation Precautions:  Precautions Precautions: Back, Fall Precaution Booklet Issued: Yes (comment) Precaution Comments: pt able to 3/3 back precautions Required Braces or Orthoses: Spinal Brace, Other Brace/Splint Spinal Brace: Lumbar corset, Applied in sitting position Other Brace/Splint: AFO RLE Restrictions Weight Bearing Restrictions: No  See Function Navigator for Current Functional Status.   Therapy/Group: Individual Therapy  Tonny Branch 08/12/2017, 4:09 PM

## 2017-08-12 NOTE — Progress Notes (Signed)
Pt's husband performed straight catheterization without any difficulty.

## 2017-08-12 NOTE — Plan of Care (Signed)
Incontinent of urine and stool Straight Catheterization for urine Pain controled with current plan of care Husband performed straight cath

## 2017-08-12 NOTE — Progress Notes (Signed)
Occupational Therapy Session Note  Patient Details  Name: Megan [name] MRN: [id number] Date of Birth: March 03, 1951  Today's Date: 08/12/2017  Session 1 OT Individual Time: 0900-1000 OT Individual Time Calculation (min): 60 min   Session 2 OT Individual Time: 1335-1415 OT Individual Time Calculation (min): 40 min    Short Term Goals: Week 2:  OT Short Term Goal 1 (Week 2): Pt will sit to stand with MOD A of 1 wiht RW to decrease burden of care OT Short Term Goal 2 (Week 2): Pt will stand pivot transfer to [facility] with MAX A 1 caregiver OT Short Term Goal 3 (Week 2): Pt will complete 1/3 toileting steps to decrease burden of care. OT Short Term Goal 4 (Week 2): Pt will comlete LB dressing with min A used ADL AE as needed  Skilled Therapeutic Interventions/Progress Updates:  Session 1   OT treatment session focused on modified bathing/dressing, sit<>stand, and standing tolerance. LSO donned EOB, then pt completed squat-pivot to wc with close supervision. UB bathing/dressing completed wc level at the sink with set-up A. OT educated pt on donning TED hose using friction reducing device, and worked on donning shoes and R AFO. Pt able to tolerate figure 4 position with R and L LE today, but still needed min A with R AFO. Sit<>stand with close supervision and increased time to walk LE's under, then OT assisted with washing buttocks and brief change. Pt needed extended rest break to recover from standing, then stood for a 2nd time for OT to adjust pants. Pt's R LE continues to spasm throughout BADL session. Pt left seated in wc at the sink to complete grooming tasks.   Session 2 Pt continues to reports low BP issues so standing activities deferred at this time. Pt propelled wc to therapy gym and completed squat-pivot transfer with supervision. UB there-ex using medium green thera-band. 3 sets x 10 of bicep curl, triceps press, hip abduction, hip adduction, and hip flexion. Pt propelled wc back to room  and left seated in wc with needs met.   Therapy Documentation Precautions:  Precautions Precautions: Back, Fall Precaution Booklet Issued: Yes (comment) Precaution Comments: pt able to 3/3 back precautions Required Braces or Orthoses: Spinal Brace, Other Brace/Splint Spinal Brace: Lumbar corset, Applied in sitting position Other Brace/Splint: AFO RLE Restrictions Weight Bearing Restrictions: No Pain:  none/denies pain  See Function Navigator for Current Functional Status.   Therapy/Group: Individual Therapy  [name] 08/12/2017, 2:23 PM

## 2017-08-12 NOTE — Progress Notes (Signed)
Physical Therapy Session Note  Patient Details  Name: Megan [name] MRN: [id number] Date of Birth: [birth year]  Today's Date: 08/12/2017 PT Individual Time: 1100-1200 PT Individual Time Calculation (min): 60 min   Short Term Goals: Week 2:  PT Short Term Goal 1 (Week 2): Pt will ambulate 10' with LRAD and min assist PT Short Term Goal 2 (Week 2): Pt will demonstrate bed mobility with min assist  Skilled Therapeutic Interventions/Progress Updates:    no c/o pain, but reports continuing to have trouble with light headedness in standing.  PT provided extensive education in discussion with pt regarding orthostasis, progressing LE strength, pros/cons of power mobility, and d/c planning.  Attempted sit<>stand for ambulation, however on standing pt with c/o light headedness and unable to remain standing (BP from 110/50 in sitting > 90/52 in standing).  PT instructed pt in BLE therex 2x8-15 reps (2# ankle weight on LLE, level 2 theraband for hips), for hip flexion, LAQ, hip abd and hip add.  Pt returned to room at end of session and positioned with call bell in reach and needs met.   Therapy Documentation Precautions:  Precautions Precautions: Back, Fall Precaution Booklet Issued: Yes (comment) Precaution Comments: pt able to 3/3 back precautions Required Braces or Orthoses: Spinal Brace, Other Brace/Splint Spinal Brace: Lumbar corset, Applied in sitting position Other Brace/Splint: AFO RLE Restrictions Weight Bearing Restrictions: No   See Function Navigator for Current Functional Status.   Therapy/Group: Individual Therapy  Michel Santee 08/12/2017, 12:47 PM

## 2017-08-12 NOTE — Progress Notes (Signed)
Subjective/Complaints: Voided incont ~162ml  Still req cath I/O Episode of orthstaic hypotension yesterday  ROS: pt denies nausea, vomiting, diarrhea, cough, shortness of breath or chest pain   Objective:  Vital Signs: Blood pressure (!) 119/51, pulse 79, temperature 97.7 F (36.5 C), temperature source Oral, resp. rate 18, height 5\' 8"  (1.727 m), weight 87 kg (191 lb 14.4 oz), SpO2 99 %. No results found. Results for orders placed or performed during the hospital encounter of 07/28/17 (from the past 72 hour(s))  Glucose, capillary     Status: None   Collection Time: 08/09/17 12:04 PM  Result Value Ref Range   Glucose-Capillary 96 65 - 99 mg/dL  Glucose, capillary     Status: Abnormal   Collection Time: 08/09/17  5:01 PM  Result Value Ref Range   Glucose-Capillary 108 (H) 65 - 99 mg/dL  Glucose, capillary     Status: Abnormal   Collection Time: 08/09/17  8:35 PM  Result Value Ref Range   Glucose-Capillary 168 (H) 65 - 99 mg/dL  Glucose, capillary     Status: Abnormal   Collection Time: 08/10/17 11:22 AM  Result Value Ref Range   Glucose-Capillary 139 (H) 65 - 99 mg/dL  Glucose, capillary     Status: Abnormal   Collection Time: 08/10/17  5:00 PM  Result Value Ref Range   Glucose-Capillary 114 (H) 65 - 99 mg/dL  Glucose, capillary     Status: Abnormal   Collection Time: 08/10/17  8:24 PM  Result Value Ref Range   Glucose-Capillary 126 (H) 65 - 99 mg/dL  Glucose, capillary     Status: Abnormal   Collection Time: 08/11/17  7:07 AM  Result Value Ref Range   Glucose-Capillary 117 (H) 65 - 99 mg/dL  Glucose, capillary     Status: Abnormal   Collection Time: 08/11/17 12:08 PM  Result Value Ref Range   Glucose-Capillary 150 (H) 65 - 99 mg/dL  Glucose, capillary     Status: Abnormal   Collection Time: 08/11/17  4:34 PM  Result Value Ref Range   Glucose-Capillary 102 (H) 65 - 99 mg/dL  Glucose, capillary     Status: Abnormal   Collection Time: 08/11/17  9:14 PM  Result Value  Ref Range   Glucose-Capillary 132 (H) 65 - 99 mg/dL  Glucose, capillary     Status: Abnormal   Collection Time: 08/12/17  6:49 AM  Result Value Ref Range   Glucose-Capillary 133 (H) 65 - 99 mg/dL     HEENT: Normocephalic. Atraumatic. Cardio: RRR without murmur. No JVD  Resp: CTA B/L and unlabored GI: BS positive and ND Skin:   Intact. Warm and dry. Urology: Urine slightly cloudy and yellow Neuro: Alert/Oriented Motor 5/5 in BUE RLE: 3-/5 HF, KE, ADF (limited still by spasticity 4-/5 Left HF, KE, ADF (limited by spasticity) Tone MAS 3 in R hamstring Musc/Skel:  No edema. No tenderness. Gen NAD. Vital signs reviewed.    Assessment/Plan: 1. 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 Physiatrist is providing close team supervision and 24 hour management of active medical problems listed below. Physiatrist and rehab team continue to assess barriers to discharge/monitor patient progress toward functional and medical goals. FIM: Function - Bathing Position: Wheelchair/chair at sink Body parts bathed by patient: Right arm, Left arm, Chest, Abdomen, Right upper leg, Left upper leg, Right lower leg, Left lower leg Body parts bathed by helper: Front perineal area, Buttocks, Back  Bathing not applicable: Buttocks, Front perineal area Assist Level: Touching or steadying assistance(Pt > 75%) Assistive Device Comment: LH sponge  Function- Upper Body Dressing/Undressing What is the patient wearing?: Pull over shirt/dress Bra - Perfomed by patient: Thread/unthread right bra strap, Thread/unthread left bra strap Bra - Perfomed by helper: Hook/unhook bra (pull down sports bra) Pull over shirt/dress - Perfomed by patient: Thread/unthread right sleeve, Thread/unthread left sleeve Pull over shirt/dress - Perfomed by helper: Put head through opening Button up shirt - Perfomed by patient:  Thread/unthread right sleeve, Pull shirt around back, Button/unbutton shirt, Thread/unthread left sleeve Orthosis activity level: Performed by patient Assist Level: Set up Set up : To obtain clothing/put away Function - Lower Body Dressing/Undressing What is the patient wearing?: Pants, Socks, AFO, Shoes Position: Wheelchair/chair at sink Pants- Performed by patient: Thread/unthread right pants leg, Thread/unthread left pants leg Pants- Performed by helper: Pull pants up/down Non-skid slipper socks- Performed by helper: Don/doff left sock, Don/doff right sock Socks - Performed by patient: Don/doff right sock, Don/doff left sock Socks - Performed by helper: Don/doff right sock Shoes - Performed by patient: Don/doff left shoe, Fasten left Shoes - Performed by helper: Fasten right, Don/doff right shoe AFO - Performed by helper: Don/doff right AFO Assist for footwear: Partial/moderate assist Assist for lower body dressing: Touching or steadying assistance (Pt > 75%)  Function - Toileting Toileting activity did not occur: No continent bowel/bladder event Toileting steps completed by helper: Adjust clothing prior to toileting, Performs perineal hygiene, Adjust clothing after toileting Toileting Assistive Devices: Grab bar or rail Assist level: Two helpers  Function - Air cabin crew transfer activity did not occur: Safety/medical concerns Toilet transfer assistive device: Elevated toilet seat/BSC over toilet, Mechanical lift Mechanical lift: Stedy(patient preference) Assist level to toilet: Moderate assist (Pt 50 - 74%/lift or lower) Assist level from toilet: Moderate assist (Pt 50 - 74%/lift or lower)  Function - Chair/bed transfer Chair/bed transfer method: Squat pivot Chair/bed transfer assist level: Touching or steadying assistance (Pt > 75%) Chair/bed transfer assistive device: Armrests Mechanical lift: Stedy Chair/bed transfer details: Verbal cues for precautions/safety,  Verbal cues for safe use of DME/AE, Verbal cues for technique  Function - Locomotion: Wheelchair Will patient use wheelchair at discharge?: Yes Type: Manual Max wheelchair distance: 150' Assist Level: No help, No cues, assistive device, takes more than reasonable amount of time Assist Level: No help, No cues, assistive device, takes more than reasonable amount of time Assist Level: No help, No cues, assistive device, takes more than reasonable amount of time Turns around,maneuvers to table,bed, and toilet,negotiates 3% grade,maneuvers on rugs and over doorsills: No Function - Locomotion: Ambulation Ambulation activity did not occur: Safety/medical concerns Assistive device: Clarise Cruz plus Max distance: 6' Assist level: Maximal assist (Pt 25 - 49%) Walk 10 feet on uneven surfaces activity did not occur: Safety/medical concerns  Function - Comprehension Comprehension: Auditory Comprehension assist level: Follows complex conversation/direction with extra time/assistive device  Function - Expression Expression: Verbal Expression assist level: Expresses complex ideas: With extra time/assistive device  Function - Social Interaction Social Interaction assist level: Interacts appropriately with others with medication or extra time (anti-anxiety, antidepressant).  Function - Problem Solving Problem solving assist level: Solves basic problems with no assist  Function - Memory Memory assist level: Complete Independence: No helper Patient normally able to recall (first 3 days only): Current season, Location of own room, Staff names and faces, That he or she is in a hospital  Medical Problem List and Plan: 1.Decreased functional  mobilitysecondary to lumbar spondylosis with radiculopathy. Status post L5-S1 anterior retroperitoneal exposure with laminectomy and fusion 07/26/2017 as well as history of multiple thoracic lumbar surgeries. Back brace when out of bed   CIR PT, OT- discussed favorable  prognosis for amb- RLE strength will still improve , LLE should get back to near normal 2. DVT Prophylaxis/Anticoagulation: SCDs.    Vascular study negative for DVT.  3. Pain Management:Celebrex 200 mg every 12 hours, Neurontin 300 mg 3 times a day, hydrocodone as needed  Baclofen changed back to Robaxin 1000 mg 4 times a day. She notes she has some improvement with this.  Encourage the use of heat as well as regular stretching.Robaxin likely helping with pain but no change in spasticity   Tizanidine qhs d/ced on 11/23 due to ?side effects.  4. Mood:Provide emotional support 5. Neuropsych: This patientiscapable of making decisions on herown behalf. 6. Skin/Wound Care:Routine skin checks 7. Fluids/Electrolytes/Nutrition:Routine I&O's   BMP within acceptable range on 11/22 8.Acute blood loss anemia.   Hb 9.6 on 11/22  Continue to monitor  9.Hypertension. Lisinopril 10 mg daily   Increased to 20mg  11/20  , but had orthostatsis in therapy yesterday TED hose, may be due to flomax but will cont since pt is starting to void Vitals:   08/11/17 1521 08/11/17 2335  BP: (!) 120/57 (!) 119/51  Pulse: 77 79  Resp: 18   Temp: 97.7 F (36.5 C)   SpO2: 99%    10.Diabetes mellitus peripheral neuropathy. Hemoglobin A1c 6.7. Glucophage 500 mg twice a day. Check blood sugars before meals and at bedtime CBG (last 3)   Recent Labs    08/11/17 1634 08/11/17 2114 08/12/17 0649  GLUCAP 102* 132* 133*      controlled 11/30 11.Constipation. Laxative assistance, increased on 11/18, increased again on 11/21 numerous bowel movements yesterday--.  Hold Senokot as per patient request   12.Remote tobacco abuse. Counseling 13. Hypoalbuminemia  Supplement initiated on 11/17 14.  Dysuria: Patient also with noted odor as well as cloudiness.      -Urinalysis is positive, urine culture +Kleb p, Started empiric Macrobid 11/24- afeb await sensitivity 15.  Urinary retention add flomax, increase  urecholine LOS (Days) 15 A FACE TO FACE EVALUATION WAS PERFORMED  Charlett Blake 08/12/2017, 8:03 AM

## 2017-08-12 NOTE — Progress Notes (Signed)
Patient's husband performed straight catheterization tonight. Demonstrated good technique and understanding of infection precautions. Needed minimal assistance.

## 2017-08-13 ENCOUNTER — Inpatient Hospital Stay (HOSPITAL_COMMUNITY): Payer: BLUE CROSS/BLUE SHIELD | Admitting: Occupational Therapy

## 2017-08-13 LAB — GLUCOSE, CAPILLARY
GLUCOSE-CAPILLARY: 110 mg/dL — AB (ref 65–99)
GLUCOSE-CAPILLARY: 151 mg/dL — AB (ref 65–99)
Glucose-Capillary: 96 mg/dL (ref 65–99)
Glucose-Capillary: 114 mg/dL — ABNORMAL HIGH (ref 65–99)

## 2017-08-13 MED ORDER — BETHANECHOL CHLORIDE 10 MG PO TABS
5.0000 mg | ORAL_TABLET | Freq: Three times a day (TID) | ORAL | Status: DC
  Administered 2017-08-13 – 2017-08-14 (×5): 5 mg via ORAL
  Filled 2017-08-13 (×5): qty 1

## 2017-08-13 MED ORDER — BISACODYL 10 MG RE SUPP
10.0000 mg | Freq: Every evening | RECTAL | Status: DC
  Administered 2017-08-14 – 2017-08-18 (×5): 10 mg via RECTAL
  Filled 2017-08-13 (×6): qty 1

## 2017-08-13 NOTE — Progress Notes (Signed)
Occupational Therapy Session Note  Patient Details  Name: Megan [name] MRN: [id number] Date of Birth: March 13, 1951  Today's Date: 08/13/2017 OT Individual Time: 1302-1400 OT Individual Time Calculation (min): 58 min    Short Term Goals: Week 2:  OT Short Term Goal 1 (Week 2): Pt will sit to stand with MOD A of 1 wiht RW to decrease burden of care OT Short Term Goal 2 (Week 2): Pt will stand pivot transfer to [facility] with MAX A 1 caregiver OT Short Term Goal 3 (Week 2): Pt will complete 1/3 toileting steps to decrease burden of care. OT Short Term Goal 4 (Week 2): Pt will comlete LB dressing with min A used ADL AE as needed  Skilled Therapeutic Interventions/Progress Updates:    Pt greeted semi-reclined in bed and agreeable to OT treatment session focused on UB and LB strengthening, improved sit<> stand, and standing tolerance. Pt donned LSO seated EOB, then completed squat-pivot to wc with min A to clear higher wc cushion. B UE strength/coordination with wc propulsion. Pt completed 4 sit<>stands throughout session with supervision and min facilitation at R knee to promote full hip/knee extension in standing. Pt continued to report dizziness in standing-orthostatic vitals taken as noted below with 20 pt drop in systolic BP upon standing. OT applied  Ace wraps to LE's with slight improvement, but still symptomatic in standing. LB there-ex including mini squats, hip abduction, adduction, hip flexion, and knee extension 10x3 sets. Pt returned to room and left seated in wc with needs met.      08/13/17 1345  Vital Signs  Patient Position (if appropriate) Orthostatic Vitals  Orthostatic Sitting  BP- Sitting 110/45  Orthostatic Standing at 0 minutes  BP- Standing at 0 minutes (!) 88/50  Pulse- Standing at 0 minutes 95   Therapy Documentation Precautions:  Precautions Precautions: Back, Fall Precaution Booklet Issued: Yes (comment) Precaution Comments: pt able to 3/3 back  precautions Required Braces or Orthoses: Spinal Brace, Other Brace/Splint Spinal Brace: Lumbar corset, Applied in sitting position Other Brace/Splint: AFO RLE Restrictions Weight Bearing Restrictions: No Vital Signs: Therapy Vitals Patient Position (if appropriate): Orthostatic Vitals Pain: Pain Assessment Pain Assessment: 0-10 Pain Score: 2  Pain Type: Acute pain Pain Location: Calf Pain Orientation: Right Pain Descriptors / Indicators: Spasm;Tightness Pain Onset: Gradual Pain Intervention(s): Repositioned  See Function Navigator for Current Functional Status.   Therapy/Group: Individual Therapy  [name] 08/13/2017, 2:07 PM

## 2017-08-13 NOTE — Progress Notes (Signed)
Pt refused BM program. Educated and re-educated pt continue to refuse.

## 2017-08-13 NOTE — Progress Notes (Signed)
Megan [name] is a 66 y.o. female 1951/03/22 [id number]  Subjective: C/o of side effects w/10 mg of bethanechol. Slept well. Feeling OK.  Objective: Vital signs in last 24 hours: Temp:  [98 F (36.7 C)-98.5 F (36.9 C)] 98 F (36.7 C) (12/01 0519) Pulse Rate:  [70-74] 70 (12/01 0519) Resp:  [18] 18 (12/01 0519) BP: (110-122)/(48-53) 122/48 (12/01 0519) SpO2:  [96 %-100 %] 96 % (12/01 0519) Weight change:  Last BM Date: 08/12/17  Intake/Output from previous day: 11/30 0701 - 12/01 0700 In: 600 [P.O.:600] Out: 1100 [Urine:1100] Last cbgs: CBG (last 3)  Recent Labs    08/12/17 1636 08/12/17 2049 08/13/17 0659  GLUCAP 104* 145* 96     Physical Exam General: No apparent distress   HEENT: not dry Lungs: Normal effort. Lungs clear to auscultation, no crackles or wheezes. Cardiovascular: Regular rate and rhythm, no edema Abdomen: S/NT/ND; BS(+) Musculoskeletal:  unchanged Neurological: No new neurological deficits Wounds: N/A    Skin: clear  Aging changes Mental state: Alert, oriented, cooperative    Lab Results: BMET    Component Value Date/Time   NA 132 (L) 08/12/2017 0731   K 5.0 08/12/2017 0731   CL 101 08/12/2017 0731   CO2 23 08/12/2017 0731   GLUCOSE 169 (H) 08/12/2017 0731   BUN 13 08/12/2017 0731   CREATININE 1.06 (H) 08/12/2017 0731   CALCIUM 8.9 08/12/2017 0731   GFRNONAA 54 (L) 08/12/2017 0731   GFRAA >60 08/12/2017 0731   CBC    Component Value Date/Time   WBC 6.4 08/04/2017 0440   RBC 3.33 (L) 08/04/2017 0440   HGB 9.6 (L) 08/04/2017 0440   HCT 29.6 (L) 08/04/2017 0440   PLT 332 08/04/2017 0440   MCV 88.9 08/04/2017 0440   MCH 28.8 08/04/2017 0440   MCHC 32.4 08/04/2017 0440   RDW 13.2 08/04/2017 0440   LYMPHSABS 1.7 08/04/2017 0440   MONOABS 0.5 08/04/2017 0440   EOSABS 0.4 08/04/2017 0440   BASOSABS 0.0 08/04/2017 0440    Studies/Results: No results found.  Medications: I have reviewed the patient's current  medications.  Assessment/Plan:   1. Lumbar radiculopathy.  Status post L5-S1 anterior retroperitoneal exposure with laminectomy and fusion 07/26/2017 as well as history of multiple thoracic lumbar surgeries. Back brace. CIR: PT/OT 2. DVT proph: SCDs 3. LBP: Celebrex, Gabapentin, Robaxin, Baclofen 4. HTN. Lisinopril 5. DM2. Metformin po 6. Constipation. LOC 7. Urinary sx's. Bethanechol dose was reduced due to lightheadedness.    Length of stay, days: [place] , MD 08/13/2017, 11:06 AM

## 2017-08-14 ENCOUNTER — Inpatient Hospital Stay (HOSPITAL_COMMUNITY): Payer: BLUE CROSS/BLUE SHIELD

## 2017-08-14 DIAGNOSIS — M5410 Radiculopathy, site unspecified: Secondary | ICD-10-CM

## 2017-08-14 LAB — GLUCOSE, CAPILLARY
GLUCOSE-CAPILLARY: 110 mg/dL — AB (ref 65–99)
GLUCOSE-CAPILLARY: 120 mg/dL — AB (ref 65–99)
GLUCOSE-CAPILLARY: 127 mg/dL — AB (ref 65–99)
GLUCOSE-CAPILLARY: 133 mg/dL — AB (ref 65–99)

## 2017-08-14 NOTE — Progress Notes (Signed)
Physical Therapy Session Note  Patient Details  Name: Megan [name] MRN: [id number] Date of Birth: [birth year]  Today's Date: 08/14/2017 PT Individual Time: 0037-0488 PT Individual Time Calculation (min): 57 min   Short Term Goals: Week 2:  PT Short Term Goal 1 (Week 2): Pt will ambulate 10' with LRAD and min assist PT Short Term Goal 2 (Week 2): Pt will demonstrate bed mobility with min assist  Skilled Therapeutic Interventions/Progress Updates:    Pt seated in w/c upon PT arrival, agreeable to therapy tx and reports minimal pain. Pt propelled w/c from room<>gym x 100 ft each direction using B UEs Mod I. In the parallel bars pt worked on standing tolerance and pre-gait. Pt performed sit<>stand with min assist, emphasis on LE weightbearing rather than pulling up with UEs. Pt with no complaints of light headedness, monitored BP in sitting:130/58 and in standing:145/70, no evidence of orthostasis. Once in standing pt reported needing to use the bathroom for BM. Pt transported back to room in w/c. Pt performed squat pivot transfer from w/c<>commode with supervision. Pt performed sit<>stands x 3 for clothing management and to clean peri area, using RW for UE support with mod assist. Pt transferred back to w/c and transported back to gym. In parallel bars pt performed sit<>stands x 4 with min assist. In standing pt worked on pre-gait, stepping forward/backward with each LE x 5 steps in place, min assist, x 3 trials. Pt propelled w/c back to room and left seated in w/c with needs in reach.    Therapy Documentation Precautions:  Precautions Precautions: Back, Fall Precaution Booklet Issued: Yes (comment) Precaution Comments: pt able to 3/3 back precautions Required Braces or Orthoses: Spinal Brace, Other Brace/Splint Spinal Brace: Lumbar corset, Applied in sitting position Other Brace/Splint: AFO RLE Restrictions Weight Bearing Restrictions: No   See Function Navigator for Current Functional  Status.   Therapy/Group: Individual Therapy  Netta Corrigan, PT, DPT 08/14/2017, 3:56 PM

## 2017-08-14 NOTE — Progress Notes (Signed)
Megan [name] is a 66 y.o. female 29-May-1951 [id number]  Subjective: No new complaints. No new problems. Slept well. Feeling OK.  The patient is less lightheaded with Bethanechol dose reduction...  Objective: Vital signs in last 24 hours: Temp:  [97.6 F (36.4 C)-98 F (36.7 C)] 98 F (36.7 C) (12/02 1423) Pulse Rate:  [76-80] 76 (12/02 1423) Resp:  [12-20] 20 (12/02 1423) BP: (128-137)/(58-62) 137/58 (12/02 1423) SpO2:  [97 %-100 %] 100 % (12/02 1423) Weight change:  Last BM Date: 08/13/17  Intake/Output from previous day: 12/01 0701 - 12/02 0700 In: 750 [P.O.:750] Out: 425 [Urine:425] Last cbgs: CBG (last 3)  Recent Labs    08/14/17 0641 08/14/17 1147 08/14/17 1643  GLUCAP 110* 120* 127*     Physical Exam General: No apparent distress   HEENT: not dry Lungs: Normal effort. Lungs clear to auscultation, no crackles or wheezes. Cardiovascular: Regular rate and rhythm, no edema Abdomen: S/NT/ND; BS(+) Musculoskeletal:  unchanged Neurological: No new neurological deficits Wounds: N/A    Skin: clear  Aging changes Mental state: Alert, oriented, cooperative    Lab Results: BMET    Component Value Date/Time   NA 132 (L) 08/12/2017 0731   K 5.0 08/12/2017 0731   CL 101 08/12/2017 0731   CO2 23 08/12/2017 0731   GLUCOSE 169 (H) 08/12/2017 0731   BUN 13 08/12/2017 0731   CREATININE 1.06 (H) 08/12/2017 0731   CALCIUM 8.9 08/12/2017 0731   GFRNONAA 54 (L) 08/12/2017 0731   GFRAA >60 08/12/2017 0731   CBC    Component Value Date/Time   WBC 6.4 08/04/2017 0440   RBC 3.33 (L) 08/04/2017 0440   HGB 9.6 (L) 08/04/2017 0440   HCT 29.6 (L) 08/04/2017 0440   PLT 332 08/04/2017 0440   MCV 88.9 08/04/2017 0440   MCH 28.8 08/04/2017 0440   MCHC 32.4 08/04/2017 0440   RDW 13.2 08/04/2017 0440   LYMPHSABS 1.7 08/04/2017 0440   MONOABS 0.5 08/04/2017 0440   EOSABS 0.4 08/04/2017 0440   BASOSABS 0.0 08/04/2017 0440    Studies/Results: No results  found.  Medications: I have reviewed the patient's current medications.  Assessment/Plan:   1.  Lumbar radiculopathy.  Status post L5-S1 anterior retroperitoneal exposure with laminectomy and fusion on 07/26/2017.  History of multiple thoracic lumbar surgeries.  Using a back brace with activity.  Continue with physical therapy and occupational therapy. 2.  DVT prophylaxis with sequential compression devices. 3.  Low back pain.  The patient is on Celebrex gabapentin Robaxin and baclofen. 4.  Hypertension.  Continue with lisinopril. 5.  Type 2 diabetes mellitus.  Continue with metformin p.o.  6.  Constipation.  Continue with a laxative of choice. 7.  Urinary symptoms.  Clinical dose was reduced due to lightheadedness.  It seems to be helping.    Length of stay, days: [place] , MD 08/14/2017, 8:36 PM

## 2017-08-15 ENCOUNTER — Inpatient Hospital Stay (HOSPITAL_COMMUNITY): Payer: BLUE CROSS/BLUE SHIELD | Admitting: Physical Therapy

## 2017-08-15 ENCOUNTER — Inpatient Hospital Stay (HOSPITAL_COMMUNITY): Payer: BLUE CROSS/BLUE SHIELD

## 2017-08-15 ENCOUNTER — Inpatient Hospital Stay (HOSPITAL_COMMUNITY): Payer: BLUE CROSS/BLUE SHIELD | Admitting: Occupational Therapy

## 2017-08-15 LAB — GLUCOSE, CAPILLARY
GLUCOSE-CAPILLARY: 122 mg/dL — AB (ref 65–99)
GLUCOSE-CAPILLARY: 111 mg/dL — AB (ref 65–99)
Glucose-Capillary: 104 mg/dL — ABNORMAL HIGH (ref 65–99)
Glucose-Capillary: 108 mg/dL — ABNORMAL HIGH (ref 65–99)

## 2017-08-15 MED ORDER — POLYETHYLENE GLYCOL 3350 17 G PO PACK
17.0000 g | PACK | Freq: Every day | ORAL | Status: DC
  Filled 2017-08-15 (×3): qty 1

## 2017-08-15 MED ORDER — BETHANECHOL CHLORIDE 10 MG PO TABS
5.0000 mg | ORAL_TABLET | Freq: Three times a day (TID) | ORAL | Status: DC
  Administered 2017-08-15 – 2017-08-19 (×12): 5 mg via ORAL
  Filled 2017-08-15 (×12): qty 1

## 2017-08-15 MED ORDER — BETHANECHOL CHLORIDE 10 MG PO TABS: 10.0000 mg | ORAL_TABLET | Freq: Three times a day (TID) | ORAL | Status: DC

## 2017-08-15 NOTE — Progress Notes (Signed)
Subjective/Complaints: Occ incont void, having freq BM after dulc supp on BID miralax  ROS: pt denies nausea, vomiting, diarrhea, cough, shortness of breath or chest pain   Objective:  Vital Signs: Blood pressure 132/62, pulse 80, temperature 97.8 F (36.6 C), temperature source Oral, resp. rate 20, height 5\' 8"  (1.727 m), weight 87 kg (191 lb 14.4 oz), SpO2 99 %. No results found. Results for orders placed or performed during the hospital encounter of 07/28/17 (from the past 72 hour(s))  Glucose, capillary     Status: Abnormal   Collection Time: 08/12/17 12:11 PM  Result Value Ref Range   Glucose-Capillary 121 (H) 65 - 99 mg/dL  Glucose, capillary     Status: Abnormal   Collection Time: 08/12/17  4:36 PM  Result Value Ref Range   Glucose-Capillary 104 (H) 65 - 99 mg/dL  Glucose, capillary     Status: Abnormal   Collection Time: 08/12/17  8:49 PM  Result Value Ref Range   Glucose-Capillary 145 (H) 65 - 99 mg/dL  Glucose, capillary     Status: None   Collection Time: 08/13/17  6:59 AM  Result Value Ref Range   Glucose-Capillary 96 65 - 99 mg/dL  Glucose, capillary     Status: Abnormal   Collection Time: 08/13/17 11:46 AM  Result Value Ref Range   Glucose-Capillary 114 (H) 65 - 99 mg/dL  Glucose, capillary     Status: Abnormal   Collection Time: 08/13/17  4:39 PM  Result Value Ref Range   Glucose-Capillary 110 (H) 65 - 99 mg/dL  Glucose, capillary     Status: Abnormal   Collection Time: 08/13/17  9:21 PM  Result Value Ref Range   Glucose-Capillary 151 (H) 65 - 99 mg/dL  Glucose, capillary     Status: Abnormal   Collection Time: 08/14/17  6:41 AM  Result Value Ref Range   Glucose-Capillary 110 (H) 65 - 99 mg/dL  Glucose, capillary     Status: Abnormal   Collection Time: 08/14/17 11:47 AM  Result Value Ref Range   Glucose-Capillary 120 (H) 65 - 99 mg/dL  Glucose, capillary     Status: Abnormal   Collection Time: 08/14/17  4:43 PM  Result Value Ref Range    Glucose-Capillary 127 (H) 65 - 99 mg/dL  Glucose, capillary     Status: Abnormal   Collection Time: 08/14/17  9:36 PM  Result Value Ref Range   Glucose-Capillary 133 (H) 65 - 99 mg/dL  Glucose, capillary     Status: Abnormal   Collection Time: 08/15/17  7:23 AM  Result Value Ref Range   Glucose-Capillary 122 (H) 65 - 99 mg/dL     HEENT: Normocephalic. Atraumatic. Cardio: RRR without murmur. No JVD  Resp: CTA B/L and unlabored GI: BS positive and ND Skin:   Intact. Warm and dry.Mild erythema at lower incision with 2cm superficial dehiscence no active drainage Urology: Urine slightly cloudy and yellow Neuro: Alert/Oriented Motor 5/5 in BUE RLE: 3-/5 HF, KE, ADF (limited still by spasticity 4-/5 Left HF, KE, ADF (limited by spasticity) Tone MAS 3 in R hamstring Musc/Skel:  No edema. No tenderness. Gen NAD. Vital signs reviewed.    Assessment/Plan: 1. 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 Physiatrist is providing close team supervision and 24 hour management of active medical problems listed below. Physiatrist and rehab team continue to assess barriers to discharge/monitor patient progress toward functional and medical goals. FIM:  Function - Bathing Position: Wheelchair/chair at sink Body parts bathed by patient: Right arm, Left arm, Chest, Abdomen, Right upper leg, Left upper leg, Right lower leg, Left lower leg Body parts bathed by helper: Front perineal area, Buttocks, Back Bathing not applicable: Buttocks, Front perineal area Assist Level: Touching or steadying assistance(Pt > 75%) Assistive Device Comment: LH sponge  Function- Upper Body Dressing/Undressing What is the patient wearing?: Pull over shirt/dress Bra - Perfomed by patient: Thread/unthread right bra strap, Thread/unthread left bra strap Bra - Perfomed by helper: Hook/unhook bra (pull down sports  bra) Pull over shirt/dress - Perfomed by patient: Thread/unthread right sleeve, Thread/unthread left sleeve Pull over shirt/dress - Perfomed by helper: Put head through opening Button up shirt - Perfomed by patient: Thread/unthread right sleeve, Pull shirt around back, Button/unbutton shirt, Thread/unthread left sleeve Orthosis activity level: Performed by patient Assist Level: Set up Set up : To obtain clothing/put away Function - Lower Body Dressing/Undressing What is the patient wearing?: Pants, Socks, AFO, Shoes Position: Wheelchair/chair at sink Pants- Performed by patient: Thread/unthread right pants leg, Thread/unthread left pants leg Pants- Performed by helper: Pull pants up/down Non-skid slipper socks- Performed by helper: Don/doff left sock, Don/doff right sock Socks - Performed by patient: Don/doff right sock, Don/doff left sock Socks - Performed by helper: Don/doff right sock Shoes - Performed by patient: Don/doff left shoe, Fasten left Shoes - Performed by helper: Fasten right, Don/doff right shoe AFO - Performed by helper: Don/doff right AFO Assist for footwear: Partial/moderate assist Assist for lower body dressing: Touching or steadying assistance (Pt > 75%)  Function - Toileting Toileting activity did not occur: No continent bowel/bladder event Toileting steps completed by helper: Adjust clothing prior to toileting, Performs perineal hygiene, Adjust clothing after toileting Toileting Assistive Devices: Grab bar or rail Assist level: Two helpers  Function - Air cabin crew transfer activity did not occur: Safety/medical concerns Toilet transfer assistive device: Elevated toilet seat/BSC over toilet, Mechanical lift Mechanical lift: (patient preference) Assist level to toilet: Moderate assist (Pt 50 - 74%/lift or lower) Assist level from toilet: Moderate assist (Pt 50 - 74%/lift or lower)  Function - Chair/bed transfer Chair/bed transfer method: Stand  pivot Chair/bed transfer assist level: Touching or steadying assistance (Pt > 75%) Chair/bed transfer assistive device: Armrests, Walker Mechanical lift: Stedy Chair/bed transfer details: Verbal cues for precautions/safety, Verbal cues for safe use of DME/AE, Verbal cues for technique  Function - Locomotion: Wheelchair Will patient use wheelchair at discharge?: Yes Type: Manual Max wheelchair distance: 150' Assist Level: No help, No cues, assistive device, takes more than reasonable amount of time Assist Level: No help, No cues, assistive device, takes more than reasonable amount of time Assist Level: No help, No cues, assistive device, takes more than reasonable amount of time Turns around,maneuvers to table,bed, and toilet,negotiates 3% grade,maneuvers on rugs and over doorsills: No Function - Locomotion: Ambulation Ambulation activity did not occur: Safety/medical concerns Assistive device: Clarise Cruz plus Max distance: 6' Assist level: Maximal assist (Pt 25 - 49%) Walk 10 feet on uneven surfaces activity did not occur: Safety/medical concerns  Function - Comprehension Comprehension: Auditory Comprehension assist level: Follows complex conversation/direction with extra time/assistive device  Function - Expression Expression: Verbal Expression assist level: Expresses complex ideas: With extra time/assistive device  Function - Social Interaction Social Interaction assist level: Interacts appropriately with others with medication or extra time (anti-anxiety, antidepressant).  Function - Problem Solving Problem solving assist level: Solves basic problems with no assist  Function - Memory Memory  assist level: Complete Independence: No helper Patient normally able to recall (first 3 days only): Current season, Location of own room, Staff names and faces, That he or she is in a hospital  Medical Problem List and Plan: 1.Decreased functional mobilitysecondary to lumbar spondylosis  with radiculopathy. Status post L5-S1 anterior retroperitoneal exposure with laminectomy and fusion 07/26/2017 as well as history of multiple thoracic lumbar surgeries. Back brace when out of bed   CIR PT, OT- 2. DVT Prophylaxis/Anticoagulation: SCDs.    Vascular study negative for DVT.  3. Pain Management:Celebrex 200 mg every 12 hours, Neurontin 300 mg 3 times a day, hydrocodone as needed  Baclofen changed back to Robaxin 1000 mg 4 times a day. She notes she has some improvement with this.  Encourage the use of heat as well as regular stretching.Robaxin likely helping with pain but no change in spasticity   Tizanidine qhs d/ced on 11/23 due to ?side effects.  4. Mood:Provide emotional support 5. Neuropsych: This patientiscapable of making decisions on herown behalf. 6. Skin/Wound Care:Routine skin checks 7. Fluids/Electrolytes/Nutrition:Routine I&O's   BMP within acceptable range on 11/22 8.Acute blood loss anemia.   Hb 9.6 on 11/22  Continue to monitor  9.Hypertension. Lisinopril 10 mg daily   Increased to 20mg  11/20  , but had orthostatsis in therapy yesterday TED hose, may be due to flomax but will cont since pt is starting to void Vitals:   08/14/17 1423 08/15/17 0542  BP: (!) 137/58 132/62  Pulse: 76 80  Resp: 20 20  Temp: 98 F (36.7 C) 97.8 F (36.6 C)  SpO2: 100% 99%   10.Diabetes mellitus peripheral neuropathy. Hemoglobin A1c 6.7. Glucophage 500 mg twice a day. Check blood sugars before meals and at bedtime CBG (last 3)   Recent Labs    08/14/17 1643 08/14/17 2136 08/15/17 0723  GLUCAP 127* 133* 122*      controlled 11/30 11.Constipation. Laxative assistance, increased on 11/18, increased again on 11/21 numerous bowel movements yesterday--.  Hold Senokot as per patient request   12.Remote tobacco abuse. Counseling 13. Hypoalbuminemia  Supplement initiated on 11/17 14.  Dysuria: Patient also with noted odor as well as cloudiness.      -Urinalysis is  positive, urine culture +Kleb p, completed bactrim      15.  Urinary retention add flomax, cannot urecholine due to low BP 15.  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

## 2017-08-15 NOTE — Progress Notes (Signed)
Administered tegaderm dressing to lower back resulted in drainage and opening. Daughter performed in and out cath. She will need more education. Administering suppository she mastered. Pt does make short complaints on how the daughter should mastered the task. Continue to motivate the daughter.

## 2017-08-15 NOTE — Progress Notes (Signed)
Physical Therapy Note  Patient Details  Name: ALYANA KREITER MRN: [id number] Date of Birth: 12-May-1951 Today's Date: 08/15/2017  1300-1420, 80 min individual tx Pain: none at rest  W/c propulsion using bil UEs on level tile.  Seated therapeutic exercise performed with LE to increase strength for functional mobility: 2 x 5 each L/R long arc quad knee extension (R active assistive) , bil hip adduction/abduction against resistance, R/L hip flexion (R active assistive), 15 x 1 bil glut sets. In L side lying, R clam shells for hip abductor activation,  knee flex/extension and R hip flex/extension (active assistive), with mirror for feedback.  In supine with pillow under buttocks, active assistve bil minimal elevation of hips by pushing through bil LRs.   Simulated car transfer to sedan height seat, with min assist, squat pivot. Cues needed for set-up regarding head/hips relationship and feet position.    Trialed R knee brace for sit> stand and stepping in parallel bars.  Brace tight, and pt unable to fully extend knee.  Pt dizzy and unable to attempt 2nd bout of standing.  Pt may benefit from R knee brace. Pt requested getting into bed so that pt can be catheterized. Squat pivot to return to bed. Mod assist sit> lying.  All needs left at hand.     See function navigator for current status.  Mariena Meares 08/15/2017, 8:00 AM

## 2017-08-15 NOTE — Progress Notes (Signed)
Occupational Therapy Session Note  Patient Details  Name: Megan [name] MRN: [id number] Date of Birth: 24-Jan-1951  Today's Date: 08/15/2017 OT Individual Time: 4481-8563 OT Individual Time Calculation (min): 68 min     Skilled Therapeutic Interventions/Progress Updates:    Upon entering the room, pt supine in bed with 8/10 pain reported in R LE this session. Pt agreeable to OT intervention with encouragement. Pt declined shower but agreeable to bathing and dressing from bed and sink. Pt washing peri area and buttocks with set up A to obtain needed items. LB dressing performed from bed with steady assistance to roll L <> R. Pt reports having multiple BM's this session and continuing to have "smears" with rolling and therapist providing assistance for hygiene. OT assisted pt with starting B TEDs onto feet and pt pulling up leg.  Pt performed supine >sit with log roll and min A for trunk. Pt with no c/o dizziness this session. OT assisted pt with donning R AFO and shoe this session. Pt performed scoot pivot from bed >wheelchair with supervision and min cues for set up and safety. Pt seated in wheelchair at sink for UB self care and grooming with overall supervision. Pt remained in wheelchair at end of session. OT providing education to pt regarding energy conservation for self care with pt verbalizing understanding. Call bell and all needed items within reach upon exiting the room.   Therapy Documentation Precautions:  Precautions Precautions: Back, Fall Precaution Booklet Issued: Yes (comment) Precaution Comments: pt able to 3/3 back precautions Required Braces or Orthoses: Spinal Brace, Other Brace/Splint Spinal Brace: Lumbar corset, Applied in sitting position Other Brace/Splint: AFO RLE Restrictions Weight Bearing Restrictions: No General:   Vital Signs:   Pain: Pain Assessment Pain Assessment: 0-10 Pain Score: 4  Pain Type: Acute pain Pain Location: Leg Pain Orientation:  Right Pain Frequency: Intermittent Pain Onset: On-going Patients Stated Pain Goal: 2 Pain Intervention(s): Medication (See eMAR) ADL:   Vision   Perception    Praxis   Exercises:   Other Treatments:    See Function Navigator for Current Functional Status.   Therapy/Group: Individual Therapy  Gypsy Decant 08/15/2017, 12:35 PM

## 2017-08-15 NOTE — Progress Notes (Signed)
Physical Therapy Weekly Progress Note  Patient Details  Name: Megan [name] MRN: [id number] Date of Birth: 31-Jul-1951  Beginning of progress report period: August 08, 2017 End of progress report period: August 15, 2017  Today's Date: 08/15/2017 PT Individual Time: 1100-1200 PT Individual Time Calculation (min): 60 min   Patient has met 1 of 2 short term goals.  Pt continues to make progress towards goals of decreasing burden of care and increasing independence with transfers and w/c mobility.  Pt does continue to struggle with ambulation due to paraparesis, intermittent orthostatic hypotension, and some fear of falling.  Have progressed to up to 7' with supervision/min guard for gait with RW, however pt does require close w/c follow for safety, which is not functional for ambulation at home.    Patient continues to demonstrate the following deficits muscle weakness and muscle paralysis, abnormal tone and decreased standing balance and decreased postural control and therefore will continue to benefit from skilled PT intervention to increase functional independence with mobility.  Patient not progressing toward long term goals.  See goal revision..  Have decreased ambulation goals to focus on strengthening and balance in therapy only.  Do not expect pt to be a functional ambulator at time of d/c.   PT Short Term Goals Week 2:  PT Short Term Goal 1 (Week 2): Pt will ambulate 10' with LRAD and min assist PT Short Term Goal 1 - Progress (Week 2): Progressing toward goal PT Short Term Goal 2 (Week 2): Pt will demonstrate bed mobility with min assist PT Short Term Goal 2 - Progress (Week 2): Met Week 3:  PT Short Term Goal 1 (Week 3): =LTGs due to ELOS  Skilled Therapeutic Interventions/Progress Updates:    no c/o pain.  Session focus on activity tolerance, strengthening, gait, and pt education for [employer] trial tomorrow.    Pt propels w/c throughout unit mod I and transfers with  lateral scoot with supervision.  Sit<>stand x3 with RW and supervision.  Gait x3' +4' +7' with RW and close supervision with w/c follow for safety.  Pt continues to demo RLE spasticity in standing with decreased ability to safely bear weight through RLE intermittently for more functional gait.  Nustep x8 minutes with BLEs and intermittent UE use at level 3 for strengthening and activity tolerance.    PT provided education to pt regarding trial of [employer] tomorrow for weight bearing and NMR for gait.  Pt agreeable.    Returned to room at end of session and left upright in w/c with call bell in reach and needs met.   Therapy Documentation Precautions:  Precautions Precautions: Back, Fall Precaution Booklet Issued: Yes (comment) Precaution Comments: pt able to 3/3 back precautions Required Braces or Orthoses: Spinal Brace, Other Brace/Splint Spinal Brace: Lumbar corset, Applied in sitting position Other Brace/Splint: AFO RLE Restrictions Weight Bearing Restrictions: No   See Function Navigator for Current Functional Status.  Therapy/Group: Individual Therapy  Michel Santee 08/15/2017, 12:08 PM

## 2017-08-16 ENCOUNTER — Inpatient Hospital Stay (HOSPITAL_COMMUNITY): Payer: BLUE CROSS/BLUE SHIELD | Admitting: Physical Therapy

## 2017-08-16 ENCOUNTER — Inpatient Hospital Stay (HOSPITAL_COMMUNITY): Payer: BLUE CROSS/BLUE SHIELD | Admitting: Occupational Therapy

## 2017-08-16 ENCOUNTER — Inpatient Hospital Stay (HOSPITAL_COMMUNITY): Payer: BLUE CROSS/BLUE SHIELD

## 2017-08-16 LAB — GLUCOSE, CAPILLARY
GLUCOSE-CAPILLARY: 123 mg/dL — AB (ref 65–99)
GLUCOSE-CAPILLARY: 114 mg/dL — AB (ref 65–99)
GLUCOSE-CAPILLARY: 102 mg/dL — AB (ref 65–99)
GLUCOSE-CAPILLARY: 112 mg/dL — AB (ref 65–99)

## 2017-08-16 MED ORDER — LISINOPRIL 10 MG PO TABS
10.0000 mg | ORAL_TABLET | Freq: Every day | ORAL | Status: DC
  Administered 2017-08-17: 10 mg via ORAL
  Filled 2017-08-16: qty 1

## 2017-08-16 NOTE — Progress Notes (Signed)
Occupational Therapy Weekly Progress Note  Patient Details  Name: Megan [name] MRN: [id number] Date of Birth: [birth year]  Beginning of progress report period: July 29, 2017 End of progress report period: August 16, 2017  Today's Date: 08/16/2017 OT Individual Time: 0805-0900 OT Individual Time Calculation (min): 55 min    Patient has met 3 of 4 short term goals. Pt has demonstrated improved strength and activity tolerance this week within BADL tasks. She has progressed to supervision level for sit<>stand, but is unable to remove UE's to stand for functional task. Pt is more indpendent with LB dressing from bed and EOB level where she can roll to pull up pants. Feel pt is on target to reach min A goals.   Patient continues to demonstrate the following deficits: muscle weakness and decreased standing balance, decreased postural control and decreased balance strategies and therefore will continue to benefit from skilled OT intervention to enhance overall performance with BADL and Reduce care partner burden.  Patient progressing toward long term goals..  Continue plan of care.  OT Short Term Goals Week 2:  OT Short Term Goal 1 (Week 2): Pt will sit to stand with MOD A of 1 wiht RW to decrease burden of care OT Short Term Goal 1 - Progress (Week 2): Met OT Short Term Goal 2 (Week 2): Pt will stand pivot transfer to [facility] with MAX A 1 caregiver OT Short Term Goal 2 - Progress (Week 2): Progressing toward goal OT Short Term Goal 3 (Week 2): Pt will complete 1/3 toileting steps to decrease burden of care. OT Short Term Goal 3 - Progress (Week 2): Met OT Short Term Goal 4 (Week 2): Pt will comlete LB dressing with min A used ADL AE as needed OT Short Term Goal 4 - Progress (Week 2): Met Week 3:  OT Short Term Goal 1 (Week 3): STG=LTG 2/2 ELOS  Skilled Therapeutic Interventions/Progress Updates:     Pt greeted semi-reclined in bed, stated she was incontinent of bowel because she didn't  have time to call for assistance. Discussed timed toileting options for home bowel management and ways to increase independence with toileting. Worked on bed mobility with rolling L and R for peri-care and brief change. Pt unable to reach behind to wash buttocks in sidelying 2/2 back precautions, but was able to wash peri-area with HOB raised with set-up A. Pt's bed at home has functions to raise HOB and foot of bed. LB dressing finished at bed level with min A to position R LE into figure 4 position. Pt then came to sitting EOB w/ supervision, donned LSO, then attempted to don shoes in figure 4 position. Pt with increased shooting pain when crossing LLE over R and unable to tolerate position, needing mod A to don shoes today. Squat-pivot to wc with close supervision, then grooming tasks completed at the sink. Pt left with needs met.   Therapy Documentation Precautions:  Precautions Precautions: Back, Fall Precaution Booklet Issued: Yes (comment) Precaution Comments: pt able to 3/3 back precautions Required Braces or Orthoses: Spinal Brace, Other Brace/Splint Spinal Brace: Lumbar corset, Applied in sitting position Other Brace/Splint: AFO RLE Restrictions Weight Bearing Restrictions: No Pain: Pain Assessment Pain Assessment: 0-10 Pain Score: 5  Pain Type: Acute pain Pain Location: Leg Pain Orientation: Right Pain Descriptors / Indicators: Aching Pain Onset: On-going Pain Intervention(s): Repositioned  See Function Navigator for Current Functional Status.   Therapy/Group: Individual Therapy  [name] 08/16/2017, 8:57 AM

## 2017-08-16 NOTE — Plan of Care (Signed)
Goals downgraded and adjusted to reflect pt progress and ongoing weakness in RLE.

## 2017-08-16 NOTE — Progress Notes (Signed)
Physical Therapy Session Note  Patient Details  Name: Megan [name] MRN: [id number] Date of Birth: 01/16/1951  Today's Date: 08/16/2017 PT Individual Time: 1401-1458 PT Individual Time Calculation (min): 57 min   Short Term Goals: Week 3:  PT Short Term Goal 1 (Week 3): =LTGs due to ELOS  Skilled Therapeutic Interventions/Progress Updates:    Pt supine in bed with NT upon PT arrival, agreeable to therapy tx and denies pain. Pt transferred supine>sitting and EOB>w/c squat pivot with supervision. Pt propelled w/c from room>gym Mod I x 100 ft. Session focused on pre-gait and dynamic standing balance. Pt performed sit<>stands x 6 throughout session with min assist using RW for UE support. Pt worked on stepping forward/backwards in place using RW for UE support x 3 trials, emphasis on R knee extension with verbal cues and manual facilitation for quadriceps activation. Pt worked on dynamic standing balance throwing horse shoes, x 2 trials using single UE support on RW and alternating which hand to throw with. Pt worked on static standing balance letting go of the RW for about 5 seconds without UE support, requiring mod assist to maintain balance and R knee block to prevent buckling. Pt propelled w/c back to room and left seated in w/c with needs in reach.   Therapy Documentation Precautions:  Precautions Precautions: Back, Fall Precaution Booklet Issued: Yes (comment) Precaution Comments: pt able to 3/3 back precautions Required Braces or Orthoses: Spinal Brace, Other Brace/Splint Spinal Brace: Lumbar corset, Applied in sitting position Other Brace/Splint: AFO RLE Restrictions Weight Bearing Restrictions: No    See Function Navigator for Current Functional Status.   Therapy/Group: Individual Therapy  Netta Corrigan, PT, DPT 08/16/2017, 8:02 AM

## 2017-08-16 NOTE — Progress Notes (Signed)
Physical Therapy Session Note  Patient Details  Name: Megan [name] MRN: [id number] Date of Birth: Feb 28, 1951  Today's Date: 08/16/2017 PT Individual Time: 0900-1010 PT Individual Time Calculation (min): 70 min   Short Term Goals: Week 3:  PT Short Term Goal 1 (Week 3): =LTGs due to ELOS  Skilled Therapeutic Interventions/Progress Updates:    c/o pain in RLE due to cramping but does not rate.  Session focus on transfers and pre-gait/gait with [facility].    Pt propels w/c to and from therapy gym mod I.  Supervision for transfers to/from therapy mat via lateral scoot.  Measurements taken and Indego unit donned with increased time for first fit.  Sit<>stand x3 with Indego.  Initiated gait training x4 steps with mod multimodal cues for upright posture and weight shifting in order to activate exoskeleton.  Pt required extended seated rest breaks due to fatigue and mild lightheadedness that resolved with rest breaks.  Returned to room at end of session, call bell in reach and needs met.   Therapy Documentation Precautions:  Precautions Precautions: Back, Fall Precaution Booklet Issued: Yes (comment) Precaution Comments: pt able to 3/3 back precautions Required Braces or Orthoses: Spinal Brace, Other Brace/Splint Spinal Brace: Lumbar corset, Applied in sitting position Other Brace/Splint: AFO RLE Restrictions Weight Bearing Restrictions: No   See Function Navigator for Current Functional Status.   Therapy/Group: Individual Therapy  Michel Santee 08/16/2017, 10:15 AM

## 2017-08-16 NOTE — Progress Notes (Signed)
Subjective/Complaints: Pain well controlled , takes about 3 hydrocodone per day, still requiring caths, bowels better  ROS: pt denies nausea, vomiting, diarrhea, cough, shortness of breath or chest pain   Objective:  Vital Signs: Blood pressure (!) 122/48, pulse 71, temperature 98.2 F (36.8 C), temperature source Oral, resp. rate 16, height 5\' 8"  (1.727 m), weight 87 kg (191 lb 14.4 oz), SpO2 97 %. No results found. Results for orders placed or performed during the hospital encounter of 07/28/17 (from the past 72 hour(s))  Glucose, capillary     Status: Abnormal   Collection Time: 08/13/17 11:46 AM  Result Value Ref Range   Glucose-Capillary 114 (H) 65 - 99 mg/dL  Glucose, capillary     Status: Abnormal   Collection Time: 08/13/17  4:39 PM  Result Value Ref Range   Glucose-Capillary 110 (H) 65 - 99 mg/dL  Glucose, capillary     Status: Abnormal   Collection Time: 08/13/17  9:21 PM  Result Value Ref Range   Glucose-Capillary 151 (H) 65 - 99 mg/dL  Glucose, capillary     Status: Abnormal   Collection Time: 08/14/17  6:41 AM  Result Value Ref Range   Glucose-Capillary 110 (H) 65 - 99 mg/dL  Glucose, capillary     Status: Abnormal   Collection Time: 08/14/17 11:47 AM  Result Value Ref Range   Glucose-Capillary 120 (H) 65 - 99 mg/dL  Glucose, capillary     Status: Abnormal   Collection Time: 08/14/17  4:43 PM  Result Value Ref Range   Glucose-Capillary 127 (H) 65 - 99 mg/dL  Glucose, capillary     Status: Abnormal   Collection Time: 08/14/17  9:36 PM  Result Value Ref Range   Glucose-Capillary 133 (H) 65 - 99 mg/dL  Glucose, capillary     Status: Abnormal   Collection Time: 08/15/17  7:23 AM  Result Value Ref Range   Glucose-Capillary 122 (H) 65 - 99 mg/dL  Glucose, capillary     Status: Abnormal   Collection Time: 08/15/17 12:07 PM  Result Value Ref Range   Glucose-Capillary 111 (H) 65 - 99 mg/dL  Glucose, capillary     Status: Abnormal   Collection Time: 08/15/17  4:21  PM  Result Value Ref Range   Glucose-Capillary 104 (H) 65 - 99 mg/dL  Glucose, capillary     Status: Abnormal   Collection Time: 08/15/17  9:26 PM  Result Value Ref Range   Glucose-Capillary 108 (H) 65 - 99 mg/dL   Comment 1 Notify RN   Glucose, capillary     Status: Abnormal   Collection Time: 08/16/17  6:48 AM  Result Value Ref Range   Glucose-Capillary 123 (H) 65 - 99 mg/dL   Comment 1 Notify RN      HEENT: Normocephalic. Atraumatic. Cardio: RRR without murmur. No JVD  Resp: CTA B/L and unlabored GI: BS positive and ND Skin:   Intact. Warm and dry.Mild erythema at lower incision with 2cm superficial dehiscence no active drainage Abd incision with honeycomb dressing , CDI, staples Neuro: Alert/Oriented Motor 5/5 in BUE RLE: 3-/5 HF, KE, ADF (limited still by spasticity 4-/5 Left HF, KE, ADF (limited by spasticity) Tone MAS 3 in R hamstring Musc/Skel:  No edema. No tenderness. Gen NAD. Vital signs reviewed.    Assessment/Plan: 1. 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 Physiatrist is providing close team supervision and 24 hour management of active medical  problems listed below. Physiatrist and rehab team continue to assess barriers to discharge/monitor patient progress toward functional and medical goals. FIM: Function - Bathing Position: Other (comment)(LB in supine,UB at sink) Body parts bathed by patient: Right arm, Left arm, Chest, Abdomen, Right upper leg, Left upper leg, Front perineal area, Buttocks Body parts bathed by helper: Front perineal area, Buttocks, Back Bathing not applicable: Back, Right lower leg, Left lower leg Assist Level: Touching or steadying assistance(Pt > 75%) Assistive Device Comment: LH sponge  Function- Upper Body Dressing/Undressing What is the patient wearing?: Pull over shirt/dress Bra - Perfomed by patient: Thread/unthread right  bra strap, Thread/unthread left bra strap, Hook/unhook bra (pull down sports bra) Bra - Perfomed by helper: Hook/unhook bra (pull down sports bra) Pull over shirt/dress - Perfomed by patient: Thread/unthread right sleeve, Thread/unthread left sleeve, Put head through opening, Pull shirt over trunk Pull over shirt/dress - Perfomed by helper: Put head through opening Button up shirt - Perfomed by patient: Thread/unthread right sleeve, Pull shirt around back, Button/unbutton shirt, Thread/unthread left sleeve Orthosis activity level: Performed by patient Assist Level: Set up Set up : To obtain clothing/put away Function - Lower Body Dressing/Undressing What is the patient wearing?: Pants, Socks, AFO, Shoes Position: Sitting EOB Pants- Performed by patient: Thread/unthread right pants leg, Thread/unthread left pants leg Pants- Performed by helper: Pull pants up/down, Thread/unthread right pants leg, Thread/unthread left pants leg Non-skid slipper socks- Performed by helper: Don/doff left sock, Don/doff right sock Socks - Performed by patient: Don/doff right sock, Don/doff left sock Socks - Performed by helper: Don/doff right sock Shoes - Performed by patient: Don/doff left shoe, Fasten left Shoes - Performed by helper: Fasten right, Don/doff right shoe AFO - Performed by helper: Don/doff right AFO Assist for footwear: Partial/moderate assist Assist for lower body dressing: (max A)  Function - Toileting Toileting activity did not occur: No continent bowel/bladder event Toileting steps completed by helper: Adjust clothing prior to toileting, Performs perineal hygiene, Adjust clothing after toileting Toileting Assistive Devices: Grab bar or rail Assist level: Two helpers  Function - Air cabin crew transfer activity did not occur: Safety/medical concerns Toilet transfer assistive device: Elevated toilet seat/BSC over toilet, Mechanical lift Mechanical lift: (patient  preference) Assist level to toilet: Moderate assist (Pt 50 - 74%/lift or lower) Assist level from toilet: Moderate assist (Pt 50 - 74%/lift or lower)  Function - Chair/bed transfer Chair/bed transfer method: Squat pivot Chair/bed transfer assist level: Touching or steadying assistance (Pt > 75%) Chair/bed transfer assistive device: Armrests, Walker Mechanical lift: Stedy Chair/bed transfer details: Manual facilitation for weight shifting, Verbal cues for technique  Function - Locomotion: Wheelchair Will patient use wheelchair at discharge?: Yes Type: Manual Max wheelchair distance: 150 Assist Level: No help, No cues, assistive device, takes more than reasonable amount of time Assist Level: No help, No cues, assistive device, takes more than reasonable amount of time Assist Level: No help, No cues, assistive device, takes more than reasonable amount of time Turns around,maneuvers to table,bed, and toilet,negotiates 3% grade,maneuvers on rugs and over doorsills: No Function - Locomotion: Ambulation Ambulation activity did not occur: Safety/medical concerns Assistive device: Clarise Cruz plus Max distance: 6' Assist level: Maximal assist (Pt 25 - 49%) Walk 10 feet on uneven surfaces activity did not occur: Safety/medical concerns  Function - Comprehension Comprehension: Auditory Comprehension assist level: Follows complex conversation/direction with extra time/assistive device  Function - Expression Expression: Verbal Expression assist level: Expresses complex ideas: With extra time/assistive device  Function - Social Interaction  Social Interaction assist level: Interacts appropriately with others with medication or extra time (anti-anxiety, antidepressant).  Function - Problem Solving Problem solving assist level: Solves basic problems with no assist  Function - Memory Memory assist level: Complete Independence: No helper Patient normally able to recall (first 3 days only): Current  season, Location of own room, Staff names and faces, That he or she is in a hospital  Medical Problem List and Plan: 1.Decreased functional mobilitysecondary to lumbar spondylosis with radiculopathy. Status post L5-S1 anterior retroperitoneal exposure with laminectomy and fusion 07/26/2017 as well as history of multiple thoracic lumbar surgeries. Back brace when out of bed   CIR PT, OT- 2. DVT Prophylaxis/Anticoagulation: SCDs.    Vascular study negative for DVT.  3. Pain Management:Celebrex 200 mg every 12 hours, Neurontin 300 mg 3 times a day, hydrocodone as needed  Baclofen changed back to Robaxin 1000 mg 4 times a day. She notes she has some improvement with this.  Encourage the use of heat as well as regular stretching.Robaxin likely helping with pain but no change in spasticity   Tizanidine qhs d/ced on 11/23 due to ?side effects.  4. Mood:Provide emotional support 5. Neuropsych: This patientiscapable of making decisions on herown behalf. 6. Skin/Wound Care:Routine skin checks, d/c abd dressing on THurs 7. Fluids/Electrolytes/Nutrition:Routine I&O's   BMP within acceptable range on 11/22 8.Acute blood loss anemia.   Hb 9.6 on 11/22  Continue to monitor  9.Hypertension. Lisinopril 10 mg daily   Increased to 20mg  11/20, BPs a little soft will reduce,to 10mg    Vitals:   08/15/17 1423 08/16/17 0500  BP: (!) 108/56 (!) 122/48  Pulse: 83 71  Resp: 16 16  Temp: 97.6 F (36.4 C) 98.2 F (36.8 C)  SpO2: 100% 97%   10.Diabetes mellitus peripheral neuropathy. Hemoglobin A1c 6.7. Glucophage 500 mg twice a day. Check blood sugars before meals and at bedtime CBG (last 3)   Recent Labs    08/15/17 1621 08/15/17 2126 08/16/17 0648  GLUCAP 104* 108* 123*      controlled 112/4 11.Constipation. Laxative assistance, increased on 11/18, increased again on 11/21 numerous bowel movements yesterday--.  Hold Senokot as per patient request   12.Remote tobacco abuse.  Counseling 13. Hypoalbuminemia  Supplement initiated on 11/17   14.  Urinary retention add flomax, limited dose urecholine due to low BP 15.  Neurogenic bowel less freq with reduction of  miralax to daily LOS (Days) [place] EVALUATION WAS PERFORMED  Charlett Blake 08/16/2017, 7:42 AM

## 2017-08-17 ENCOUNTER — Inpatient Hospital Stay (HOSPITAL_COMMUNITY): Payer: BLUE CROSS/BLUE SHIELD | Admitting: Occupational Therapy

## 2017-08-17 ENCOUNTER — Other Ambulatory Visit: Payer: Self-pay

## 2017-08-17 ENCOUNTER — Inpatient Hospital Stay (HOSPITAL_COMMUNITY): Payer: BLUE CROSS/BLUE SHIELD | Admitting: Physical Therapy

## 2017-08-17 LAB — GLUCOSE, CAPILLARY
GLUCOSE-CAPILLARY: 118 mg/dL — AB (ref 65–99)
GLUCOSE-CAPILLARY: 95 mg/dL (ref 65–99)
GLUCOSE-CAPILLARY: 104 mg/dL — AB (ref 65–99)
GLUCOSE-CAPILLARY: 132 mg/dL — AB (ref 65–99)

## 2017-08-17 MED ORDER — TIZANIDINE HCL 2 MG PO TABS
2.0000 mg | ORAL_TABLET | Freq: Three times a day (TID) | ORAL | Status: DC
  Administered 2017-08-17 – 2017-08-19 (×6): 2 mg via ORAL
  Filled 2017-08-17 (×6): qty 1

## 2017-08-17 MED ORDER — LISINOPRIL 5 MG PO TABS
5.0000 mg | ORAL_TABLET | Freq: Every day | ORAL | Status: DC
  Filled 2017-08-17 (×2): qty 1

## 2017-08-17 NOTE — Progress Notes (Signed)
Subjective/Complaints:  Still having R hamstring spasms that interfere with mobility Discussed difference between muscle spasms and spasticity and the meds we use for this  ROS: pt denies nausea, vomiting, diarrhea, cough, shortness of breath or chest pain   Objective:  Vital Signs: Blood pressure (!) 121/50, pulse 79, temperature 98.2 F (36.8 C), temperature source Oral, resp. rate 20, height 5' 8"  (1.727 m), weight 73.4 kg (161 lb 12.8 oz), SpO2 98 %. No results found. Results for orders placed or performed during the hospital encounter of 07/28/17 (from the past 72 hour(s))  Glucose, capillary     Status: Abnormal   Collection Time: 08/14/17 11:47 AM  Result Value Ref Range   Glucose-Capillary 120 (H) 65 - 99 mg/dL  Glucose, capillary     Status: Abnormal   Collection Time: 08/14/17  4:43 PM  Result Value Ref Range   Glucose-Capillary 127 (H) 65 - 99 mg/dL  Glucose, capillary     Status: Abnormal   Collection Time: 08/14/17  9:36 PM  Result Value Ref Range   Glucose-Capillary 133 (H) 65 - 99 mg/dL  Glucose, capillary     Status: Abnormal   Collection Time: 08/15/17  7:23 AM  Result Value Ref Range   Glucose-Capillary 122 (H) 65 - 99 mg/dL  Glucose, capillary     Status: Abnormal   Collection Time: 08/15/17 12:07 PM  Result Value Ref Range   Glucose-Capillary 111 (H) 65 - 99 mg/dL  Glucose, capillary     Status: Abnormal   Collection Time: 08/15/17  4:21 PM  Result Value Ref Range   Glucose-Capillary 104 (H) 65 - 99 mg/dL  Glucose, capillary     Status: Abnormal   Collection Time: 08/15/17  9:26 PM  Result Value Ref Range   Glucose-Capillary 108 (H) 65 - 99 mg/dL   Comment 1 Notify RN   Glucose, capillary     Status: Abnormal   Collection Time: 08/16/17  6:48 AM  Result Value Ref Range   Glucose-Capillary 123 (H) 65 - 99 mg/dL   Comment 1 Notify RN   Glucose, capillary     Status: Abnormal   Collection Time: 08/16/17 11:30 AM  Result Value Ref Range    Glucose-Capillary 114 (H) 65 - 99 mg/dL  Glucose, capillary     Status: Abnormal   Collection Time: 08/16/17  4:35 PM  Result Value Ref Range   Glucose-Capillary 102 (H) 65 - 99 mg/dL  Glucose, capillary     Status: Abnormal   Collection Time: 08/16/17  8:50 PM  Result Value Ref Range   Glucose-Capillary 112 (H) 65 - 99 mg/dL  Glucose, capillary     Status: Abnormal   Collection Time: 08/17/17  6:45 AM  Result Value Ref Range   Glucose-Capillary 118 (H) 65 - 99 mg/dL     HEENT: Normocephalic. Atraumatic. Cardio: RRR without murmur. No JVD  Resp: CTA B/L and unlabored GI: BS positive and ND Skin:   Intact. Warm and dry.Mild erythema at lower incision with 2cm superficial dehiscence no active drainage Abd incision with honeycomb dressing , CDI, staples Neuro: Alert/Oriented Motor 5/5 in BUE RLE: 3-/5 HF, KE, ADF (limited still by spasticity 4-/5 Left HF, KE, ADF (limited by spasticity) Tone MAS 3 in R hamstring Musc/Skel:  No edema. No tenderness. Gen NAD. Vital signs reviewed.    Assessment/Plan: 1. 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 Physiatrist is providing close  team supervision and 24 hour management of active medical problems listed below. Physiatrist and rehab team continue to assess barriers to discharge/monitor patient progress toward functional and medical goals. FIM: Function - Bathing Position: Other (comment)(LB in supine,UB at sink) Body parts bathed by patient: Right arm, Left arm, Chest, Abdomen, Right upper leg, Left upper leg, Front perineal area, Buttocks Body parts bathed by helper: Front perineal area, Buttocks, Back Bathing not applicable: Back, Right lower leg, Left lower leg Assist Level: Touching or steadying assistance(Pt > 75%) Assistive Device Comment: LH sponge  Function- Upper Body Dressing/Undressing What is the patient wearing?:  Pull over shirt/dress Bra - Perfomed by patient: Thread/unthread right bra strap, Thread/unthread left bra strap, Hook/unhook bra (pull down sports bra) Bra - Perfomed by helper: Hook/unhook bra (pull down sports bra) Pull over shirt/dress - Perfomed by patient: Thread/unthread right sleeve, Thread/unthread left sleeve, Put head through opening, Pull shirt over trunk Pull over shirt/dress - Perfomed by helper: Put head through opening Button up shirt - Perfomed by patient: Thread/unthread right sleeve, Pull shirt around back, Button/unbutton shirt, Thread/unthread left sleeve Orthosis activity level: Performed by patient Assist Level: Set up Set up : To obtain clothing/put away Function - Lower Body Dressing/Undressing What is the patient wearing?: Pants, Socks, AFO, Shoes Position: Sitting EOB Pants- Performed by patient: Thread/unthread right pants leg, Thread/unthread left pants leg Pants- Performed by helper: Pull pants up/down, Thread/unthread right pants leg, Thread/unthread left pants leg Non-skid slipper socks- Performed by helper: Don/doff left sock, Don/doff right sock Socks - Performed by patient: Don/doff right sock, Don/doff left sock Socks - Performed by helper: Don/doff right sock Shoes - Performed by patient: Don/doff left shoe, Fasten left Shoes - Performed by helper: Fasten right, Don/doff right shoe AFO - Performed by helper: Don/doff right AFO Assist for footwear: Partial/moderate assist Assist for lower body dressing: (max A)  Function - Toileting Toileting activity did not occur: No continent bowel/bladder event Toileting steps completed by patient: Adjust clothing prior to toileting Toileting steps completed by helper: Adjust clothing prior to toileting, Performs perineal hygiene, Adjust clothing after toileting Toileting Assistive Devices: Grab bar or rail Assist level: Touching or steadying assistance (Pt.75%)  Function - Air cabin crew transfer  activity did not occur: Refused Toilet transfer assistive device: Elevated toilet seat/BSC over toilet, Mechanical lift Mechanical lift: (patient preference) Assist level to toilet: Moderate assist (Pt 50 - 74%/lift or lower) Assist level from toilet: Moderate assist (Pt 50 - 74%/lift or lower)  Function - Chair/bed transfer Chair/bed transfer method: Squat pivot Chair/bed transfer assist level: Supervision or verbal cues Chair/bed transfer assistive device: Armrests Mechanical lift: Stedy Chair/bed transfer details: Manual facilitation for weight shifting, Verbal cues for technique  Function - Locomotion: Wheelchair Will patient use wheelchair at discharge?: Yes Type: Manual Max wheelchair distance: 150 Assist Level: No help, No cues, assistive device, takes more than reasonable amount of time Assist Level: No help, No cues, assistive device, takes more than reasonable amount of time Assist Level: No help, No cues, assistive device, takes more than reasonable amount of time Turns around,maneuvers to table,bed, and toilet,negotiates 3% grade,maneuvers on rugs and over doorsills: No Function - Locomotion: Ambulation Ambulation activity did not occur: Safety/medical concerns Assistive device: Clarise Cruz plus Max distance: 6' Assist level: Maximal assist (Pt 25 - 49%) Walk 10 feet on uneven surfaces activity did not occur: Safety/medical concerns  Function - Comprehension Comprehension: Auditory Comprehension assist level: Follows complex conversation/direction with extra time/assistive device  Function - Expression  Expression: Verbal Expression assist level: Expresses complex ideas: With extra time/assistive device  Function - Social Interaction Social Interaction assist level: Interacts appropriately with others with medication or extra time (anti-anxiety, antidepressant).  Function - Problem Solving Problem solving assist level: Solves basic problems with no assist  Function -  Memory Memory assist level: Complete Independence: No helper Patient normally able to recall (first 3 days only): Current season, Location of own room, Staff names and faces, That he or she is in a hospital  Medical Problem List and Plan: 1.Decreased functional mobilitysecondary to lumbar spondylosis with radiculopathy. Status post L5-S1 anterior retroperitoneal exposure with laminectomy and fusion 07/26/2017 as well as history of multiple thoracic lumbar surgeries. Back brace when out of bed   CIR PT, OT-  Team conference today please see physician documentation under team conference tab, met with team face-to-face to discuss problems,progress, and goals. Formulized individual treatment plan based on medical history, underlying problem and comorbidities.       2. DVT Prophylaxis/Anticoagulation: SCDs.    Vascular study negative for DVT.  3. Pain Management:Celebrex 200 mg every 12 hours, Neurontin 300 mg 3 times a day, hydrocodone as needed  Baclofen changed back to Robaxin 1000 mg 4 times a day. Some pain relief but spasticity is worse, will trial low dose tizanidine and will reduce lisinopril to compensate for BP decrease  4. Mood:Provide emotional support 5. Neuropsych: This patientiscapable of making decisions on herown behalf. 6. Skin/Wound Care:Routine skin checks, d/c abd dressing on THurs 7. Fluids/Electrolytes/Nutrition:Routine I&O's   BMP within acceptable range on 11/22 8.Acute blood loss anemia.   Hb 9.6 on 11/22  Continue to monitor  9.Hypertension. Lisinopril 10 mg daily-monitor on current dose    Vitals:   08/16/17 1409 08/17/17 0256  BP: (!) 120/54 (!) 121/50  Pulse: 80 79  Resp: 20 20  Temp: 97.7 F (36.5 C) 98.2 F (36.8 C)  SpO2: 100% 98%   10.Diabetes mellitus peripheral neuropathy. Hemoglobin A1c 6.7. Glucophage 500 mg twice a day. Check blood sugars before meals and at bedtime CBG (last 3)   Recent Labs    08/16/17 1635 08/16/17 2050  08/17/17 0645  GLUCAP 102* 112* 118*      controlled 112/5 11.Constipation. Laxative assistance, increased on 11/18, increased again on 11/21 numerous bowel movements yesterday--.  Hold Senokot as per patient request   12.Remote tobacco abuse. Counseling 13. Hypoalbuminemia  Supplement initiated on 11/17   14.  Urinary retention add flomax, limited dose urecholine due to low BP, had one complete void 15.  Neurogenic bowel less freq with reduction of  miralax to daily LOS (Days) 20 A FACE TO FACE EVALUATION WAS PERFORMED  Charlett Blake 08/17/2017, 8:35 AM

## 2017-08-17 NOTE — Progress Notes (Signed)
Occupational Therapy Session Note  Patient Details  Name: Megan [name] MRN: [id number] Date of Birth: 09/05/1951  Today's Date: 08/17/2017 OT Individual Time: 0900-1000 OT Individual Time Calculation (min): 60 min    Short Term Goals: Week 3:  OT Short Term Goal 1 (Week 3): STG=LTG 2/2 ELOS  Skilled Therapeutic Interventions/Progress Updates:    OT treatment session focused on modified bathing/dressing, activity tolerance, and home management. Discussed home BADL management with pt reporting she will likely complete LB bathing/dressing bed level when she doesn't shower at home. Pt able to roll side to side to wash buttocks and clean peri-area with only set-up A today. Pt donned pants with HOB elevated and set-up A, including rolling side to side to pull pants over hips. UB bathing/dressing completed seated EOB with set-up, then pt able to don shoes today seated EOB in figure 4 position and even fasten R AFO. Squat-pivot to wc w/ supervision, then grooming completed wc level at the sink.   Therapy Documentation Precautions:  Precautions Precautions: Back, Fall Precaution Booklet Issued: Yes (comment) Precaution Comments: pt able to 3/3 back precautions Required Braces or Orthoses: Spinal Brace, Other Brace/Splint Spinal Brace: Lumbar corset, Applied in sitting position Other Brace/Splint: AFO RLE Restrictions Weight Bearing Restrictions: No Pain: Pain Assessment Pain Assessment: 0-10 Pain Score: 3  Pain Type: Acute pain Pain Location: Leg Pain Orientation: Right Pain Descriptors / Indicators: Spasm Pain Frequency: Intermittent Pain Onset: Sudden Patients Stated Pain Goal: 3 Pain Intervention(s): Repositioned  See Function Navigator for Current Functional Status.   Therapy/Group: Individual Therapy  [name] 08/17/2017, 10:02 AM

## 2017-08-17 NOTE — Progress Notes (Signed)
Occupational Therapy Session Note  Patient Details  Name: [name] MRN: [id number] Date of Birth: 01/22/1951  Today's Date: 08/17/2017 OT Individual Time: 3005-1102 and 1117-3567 OT Individual Time Calculation (min): 28 min and 43 min    Short Term Goals: Week 3:  OT Short Term Goal 1 (Week 3): STG=LTG 2/2 ELOS  Skilled Therapeutic Interventions/Progress Updates:   Session 1: Upon entering the room, pt seated in wheelchair with no c/o pain but reports feeling very fatigued from prior therapy session. Pt given paper handout on energy conservation this session and education continued on the topic. Pt asked about concerns regarding upcoming discharge with pt stating, " I need to figure out how I can do my christmas shopping for my granddaughter." Pt and therapist discussed various ways to engage in community mobility task safely after discharge to participate in this important task. Pt remained in wheelchair at end of session. Call bell and all needed items within reach upon exiting the room.   Session 2: Pt received in gym and transitioned from PT session with c/o fatigue but agreeable to OT intervention. Pt engaged in wheelchair mobility over various surfaces 150' at mod I level. Pt transferred from wheelchair >mat with supervision and increased time. Pt reports feeling "not right". BP taken while seated at 96/62. Pt standing for 2 minutes with vitals reading 107/65 and returning to seated position it read 115/61. Pt continues to report feeling unwell and requesting to return to room. Pt transferred herself back into wheelchair with supervision while managing all wheelchair parts. Pt propelled wheelchair to room and remained in chair with call bell and all needed items within reach.   Therapy Documentation Precautions:  Precautions Precautions: Back, Fall Precaution Booklet Issued: Yes (comment) Precaution Comments: pt able to 3/3 back precautions Required Braces or Orthoses: Spinal  Brace, Other Brace/Splint Spinal Brace: Lumbar corset, Applied in sitting position Other Brace/Splint: AFO RLE Restrictions Weight Bearing Restrictions: No   Pain: Pain Assessment Pain Score: 3   See Function Navigator for Current Functional Status.   Therapy/Group: Individual Therapy  Gypsy Decant 08/17/2017, 12:45 PM

## 2017-08-17 NOTE — Progress Notes (Signed)
Physical Therapy Session Note  Patient Details  Name: Megan [name] MRN: [id number] Date of Birth: 1951-07-28  Today's Date: 08/17/2017 PT Individual Time: 1130-1200 and 1415-1515 PT Individual Time Calculation (min): 30 min and 60 min   Short Term Goals: Week 3:  PT Short Term Goal 1 (Week 3): =LTGs due to ELOS  Skilled Therapeutic Interventions/Progress Updates:    Session 1: no c/o pain.  Session focus on LE strengthening and w/c management.  Pt completes w/c mobility in standard w/c with BUEs with increased time.  PT provided pt with ultra-lightweight w/c and w/c gloves and pt completes w/c>mat>w/c transfer with supervision.  BLE therex 2x15 reps for hip flexion and LAQ.  Pt returned to room at end of session with call bell in reach and needs met.   Session 2: Pt reporting low BPs but states she's feeling better.  Lateral scoot to w/c supervision.  Pt propels w/c throughout unit mod I in controlled environment.  Nustep x10 minutes (rest break at 5 minutes) at level 2 with BLEs for strengthening and endurance.  PT instructed pt in w/c obstacle course focus on navigation over obstacles, around obstacles, up/down small ledge, and on compliant surface from w/c level.  Handoff to OT in therapy gym.   Therapy Documentation Precautions:  Precautions Precautions: Back, Fall Precaution Booklet Issued: Yes (comment) Precaution Comments: pt able to 3/3 back precautions Required Braces or Orthoses: Spinal Brace, Other Brace/Splint Spinal Brace: Lumbar corset, Applied in sitting position Other Brace/Splint: AFO RLE Restrictions Weight Bearing Restrictions: No   See Function Navigator for Current Functional Status.   Therapy/Group: Individual Therapy  Michel Santee 08/17/2017, 4:27 PM

## 2017-08-18 ENCOUNTER — Inpatient Hospital Stay (HOSPITAL_COMMUNITY): Payer: BLUE CROSS/BLUE SHIELD

## 2017-08-18 ENCOUNTER — Inpatient Hospital Stay (HOSPITAL_COMMUNITY): Payer: BLUE CROSS/BLUE SHIELD | Admitting: Occupational Therapy

## 2017-08-18 ENCOUNTER — Other Ambulatory Visit: Payer: Self-pay

## 2017-08-18 ENCOUNTER — Inpatient Hospital Stay (HOSPITAL_COMMUNITY): Payer: BLUE CROSS/BLUE SHIELD | Admitting: Physical Therapy

## 2017-08-18 LAB — GLUCOSE, CAPILLARY
GLUCOSE-CAPILLARY: 104 mg/dL — AB (ref 65–99)
GLUCOSE-CAPILLARY: 115 mg/dL — AB (ref 65–99)
Glucose-Capillary: 114 mg/dL — ABNORMAL HIGH (ref 65–99)
Glucose-Capillary: 112 mg/dL — ABNORMAL HIGH (ref 65–99)

## 2017-08-18 NOTE — Progress Notes (Signed)
Occupational Therapy Discharge Summary  Patient Details  Name: Megan [name] MRN: [id number] Date of Birth: May 29, 1951  Today's Date: 08/18/2017 OT Individual Time: [phone] OT Individual Time Calculation (min): 87 min    Patient has met 8 of 8 long term goals due to improved activity tolerance, improved balance, postural control, ability to compensate for deficits and functional use of  RIGHT lower and LEFT lower extremity.  Patient to discharge at overall supervision/Min Assist level.  Patient's care partner is independent to provide the necessary physical assistance at discharge.    Reasons goals not met: n/a  Recommendation:  Patient will benefit from ongoing skilled OT services in home health setting to continue to advance functional skills in the area of BADL.  Equipment: w/c (see PT note for dimensions)  Reasons for discharge: treatment goals met and discharge from hospital  Patient/family agrees with progress made and goals achieved: Yes  OT Discharge Precautions/Restrictions  Precautions Precautions: Back;Fall Precaution Booklet Issued: Yes (comment) Precaution Comments: maintains 3/3 back precautions Required Braces or Orthoses: Spinal Brace;Other Brace/Splint Spinal Brace: Lumbar corset;Applied in sitting position Other Brace/Splint: AFO RLE Restrictions Weight Bearing Restrictions: No Pain Pain Assessment Pain Assessment: No/denies pain ADL ADL Equipment Provided: Reacher, Sock aid Eating: Independent Grooming: Independent Upper Body Bathing: Setup, Supervision/safety Lower Body Bathing: Supervision/safety, Setup Upper Body Dressing: Supervision/safety, Setup Lower Body Dressing: Supervision/safety, Setup Toileting: Minimal assistance Toilet Transfer: Close supervision Tub/Shower Transfer: Close supervison Vision Baseline Vision/History: Wears glasses Wears Glasses: Reading only Patient Visual Report: No change from baseline Vision Assessment?: No  apparent visual deficits Perception  Perception: Within Functional Limits Praxis Praxis: Intact Cognition Overall Cognitive Status: Within Functional Limits for tasks assessed Arousal/Alertness: Awake/alert Orientation Level: Oriented X4 Sustained Attention: Appears intact Memory: Appears intact Awareness: Appears intact Problem Solving: Appears intact Safety/Judgment: Appears intact Sensation Sensation Light Touch: Impaired Detail Light Touch Impaired Details: Absent RLE(baseline) Proprioception: Appears Intact Additional Comments: pt with deep touch sensation in RLE Coordination Coordination and Movement Description: paraparesis, improving in LLE>RLE Motor  Motor Motor: Paraplegia Motor - Discharge Observations: paraparesis RLE>LLE Mobility  Bed Mobility Bed Mobility: Supine to Sit;Rolling Right Rolling Right: 5: Supervision Supine to Sit: 4: Min assist Sit to Supine: 4: Min guard Transfers Sit to Stand: 4: Min assist Stand to Sit: 4: Min assist  Trunk/Postural Assessment  Cervical Assessment Cervical Assessment: Within Functional Limits Thoracic Assessment Thoracic Assessment: Within Functional Limits Lumbar Assessment Lumbar Assessment: (LSO) Postural Control Postural Control: Within Functional Limits  Balance Balance Balance Assessed: Yes Dynamic Sitting Balance Dynamic Sitting - Level of Assistance: 6: Modified independent (Device/Increase time) Sitting balance - Comments: unable to reach outside of BOS utilized UE on bed to brace trunk due to pain Dynamic Standing Balance Dynamic Standing - Level of Assistance: 4: Min assist Extremity/Trunk Assessment RUE Assessment RUE Assessment: Within Functional Limits LUE Assessment LUE Assessment: Within Functional Limits   See Function Navigator for Current Functional Status.  Daneen Schick Vivia Rosenburg 08/18/2017, 3:58 PM

## 2017-08-18 NOTE — Progress Notes (Signed)
Subjective/Complaints:  Hamstring spasm slightly better on tizanidine Bowels doing well Bladder still requiring ICP  ROS: pt denies nausea, vomiting, diarrhea, cough, shortness of breath or chest pain   Objective:  Vital Signs: Blood pressure (!) 141/56, pulse 76, temperature 97.9 F (36.6 C), temperature source Oral, resp. rate 18, height 5\' 8"  (1.727 m), weight 73.4 kg (161 lb 12.8 oz), SpO2 98 %. No results found. Results for orders placed or performed during the hospital encounter of 07/28/17 (from the past 72 hour(s))  Glucose, capillary     Status: Abnormal   Collection Time: 08/15/17 12:07 PM  Result Value Ref Range   Glucose-Capillary 111 (H) 65 - 99 mg/dL  Glucose, capillary     Status: Abnormal   Collection Time: 08/15/17  4:21 PM  Result Value Ref Range   Glucose-Capillary 104 (H) 65 - 99 mg/dL  Glucose, capillary     Status: Abnormal   Collection Time: 08/15/17  9:26 PM  Result Value Ref Range   Glucose-Capillary 108 (H) 65 - 99 mg/dL   Comment 1 Notify RN   Glucose, capillary     Status: Abnormal   Collection Time: 08/16/17  6:48 AM  Result Value Ref Range   Glucose-Capillary 123 (H) 65 - 99 mg/dL   Comment 1 Notify RN   Glucose, capillary     Status: Abnormal   Collection Time: 08/16/17 11:30 AM  Result Value Ref Range   Glucose-Capillary 114 (H) 65 - 99 mg/dL  Glucose, capillary     Status: Abnormal   Collection Time: 08/16/17  4:35 PM  Result Value Ref Range   Glucose-Capillary 102 (H) 65 - 99 mg/dL  Glucose, capillary     Status: Abnormal   Collection Time: 08/16/17  8:50 PM  Result Value Ref Range   Glucose-Capillary 112 (H) 65 - 99 mg/dL  Glucose, capillary     Status: Abnormal   Collection Time: 08/17/17  6:45 AM  Result Value Ref Range   Glucose-Capillary 118 (H) 65 - 99 mg/dL  Glucose, capillary     Status: None   Collection Time: 08/17/17 12:09 PM  Result Value Ref Range   Glucose-Capillary 95 65 - 99 mg/dL  Glucose, capillary     Status:  Abnormal   Collection Time: 08/17/17  4:44 PM  Result Value Ref Range   Glucose-Capillary 104 (H) 65 - 99 mg/dL  Glucose, capillary     Status: Abnormal   Collection Time: 08/17/17  9:06 PM  Result Value Ref Range   Glucose-Capillary 132 (H) 65 - 99 mg/dL   Comment 1 Notify RN   Glucose, capillary     Status: Abnormal   Collection Time: 08/18/17  6:48 AM  Result Value Ref Range   Glucose-Capillary 114 (H) 65 - 99 mg/dL     HEENT: Normocephalic. Atraumatic. Cardio: RRR without murmur. No JVD  Resp: CTA B/L and unlabored GI: BS positive and ND Skin:   Intact. Warm and dry.Mild erythema at lower incision with 2cm superficial dehiscence no active drainage Abd incision with honeycomb dressing , CDI, staples Neuro: Alert/Oriented Motor 5/5 in BUE RLE: 3-/5 HF, KE, ADF (limited still by spasticity 4-/5 Left HF, KE, ADF (limited by spasticity) Tone MAS 3 in R hamstring Musc/Skel:  No edema. No tenderness. Gen NAD. Vital signs reviewed.    Assessment/Plan: 1. 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 Physiatrist is providing close team supervision and 24  hour management of active medical problems listed below. Physiatrist and rehab team continue to assess barriers to discharge/monitor patient progress toward functional and medical goals. FIM: Function - Bathing Position: Bed(and EOB) Body parts bathed by patient: Right arm, Abdomen, Front perineal area, Chest, Left arm, Buttocks, Right upper leg, Left upper leg, Right lower leg, Left lower leg Body parts bathed by helper: Front perineal area, Buttocks, Back Bathing not applicable: Back, Right lower leg, Left lower leg Assist Level: Supervision or verbal cues, Set up Assistive Device Comment: LH sponge  Function- Upper Body Dressing/Undressing What is the patient wearing?: Pull over shirt/dress, Bra Bra - Perfomed by patient:  Thread/unthread right bra strap, Thread/unthread left bra strap, Hook/unhook bra (pull down sports bra) Bra - Perfomed by helper: Hook/unhook bra (pull down sports bra) Pull over shirt/dress - Perfomed by patient: Thread/unthread right sleeve, Thread/unthread left sleeve, Put head through opening, Pull shirt over trunk Pull over shirt/dress - Perfomed by helper: Put head through opening Button up shirt - Perfomed by patient: Thread/unthread right sleeve, Pull shirt around back, Button/unbutton shirt, Thread/unthread left sleeve Orthosis activity level: Performed by patient Assist Level: More than reasonable time Set up : To obtain clothing/put away Function - Lower Body Dressing/Undressing What is the patient wearing?: Pants, Shoes, AFO Position: Bed(and EOB) Pants- Performed by patient: Thread/unthread right pants leg, Thread/unthread left pants leg, Pull pants up/down Pants- Performed by helper: Pull pants up/down, Thread/unthread right pants leg, Thread/unthread left pants leg Non-skid slipper socks- Performed by helper: Don/doff left sock, Don/doff right sock Socks - Performed by patient: Don/doff right sock, Don/doff left sock Socks - Performed by helper: Don/doff right sock Shoes - Performed by patient: Don/doff left shoe, Fasten right, Don/doff right shoe, Fasten left Shoes - Performed by helper: Fasten right, Don/doff right shoe AFO - Performed by patient: Don/doff right AFO AFO - Performed by helper: Don/doff right AFO Assist for footwear: Setup Assist for lower body dressing: Supervision or verbal cues  Function - Toileting Toileting activity did not occur: No continent bowel/bladder event Toileting steps completed by patient: Adjust clothing prior to toileting Toileting steps completed by helper: Adjust clothing prior to toileting, Performs perineal hygiene, Adjust clothing after toileting Toileting Assistive Devices: Grab bar or rail Assist level: Touching or steadying  assistance (Pt.75%)  Function - Air cabin crew transfer activity did not occur: Refused Toilet transfer assistive device: Elevated toilet seat/BSC over toilet, Mechanical lift Mechanical lift: (patient preference) Assist level to toilet: Moderate assist (Pt 50 - 74%/lift or lower) Assist level from toilet: Moderate assist (Pt 50 - 74%/lift or lower)  Function - Chair/bed transfer Chair/bed transfer method: Squat pivot Chair/bed transfer assist level: Supervision or verbal cues Chair/bed transfer assistive device: Armrests Mechanical lift: Stedy Chair/bed transfer details: Manual facilitation for weight shifting, Verbal cues for technique  Function - Locomotion: Wheelchair Will patient use wheelchair at discharge?: Yes Type: Manual Max wheelchair distance: 150' Assist Level: No help, No cues, assistive device, takes more than reasonable amount of time Assist Level: No help, No cues, assistive device, takes more than reasonable amount of time Assist Level: No help, No cues, assistive device, takes more than reasonable amount of time Turns around,maneuvers to table,bed, and toilet,negotiates 3% grade,maneuvers on rugs and over doorsills: No Function - Locomotion: Ambulation Ambulation activity did not occur: Safety/medical concerns Assistive device: Clarise Cruz plus Max distance: 6' Assist level: Maximal assist (Pt 25 - 49%) Walk 10 feet on uneven surfaces activity did not occur: Safety/medical concerns  Function -  Comprehension Comprehension: Auditory Comprehension assist level: Follows complex conversation/direction with extra time/assistive device  Function - Expression Expression: Verbal Expression assist level: Expresses complex ideas: With extra time/assistive device  Function - Social Interaction Social Interaction assist level: Interacts appropriately with others with medication or extra time (anti-anxiety, antidepressant).  Function - Problem Solving Problem solving  assist level: Solves basic problems with no assist  Function - Memory Memory assist level: Complete Independence: No helper Patient normally able to recall (first 3 days only): Current season, Location of own room, Staff names and faces, That he or she is in a hospital  Medical Problem List and Plan: 1.Decreased functional mobilitysecondary to lumbar spondylosis with radiculopathy. Status post L5-S1 anterior retroperitoneal exposure with laminectomy and fusion 07/26/2017 as well as history of multiple thoracic lumbar surgeries. Back brace when out of bed   CIR PT, OT-  Plan d/c in am      2. DVT Prophylaxis/Anticoagulation: SCDs.    Vascular study negative for DVT.  3. Pain Management:Celebrex 200 mg every 12 hours, Neurontin 300 mg 3 times a day, hydrocodone as needed  Baclofen changed back to Robaxin 1000 mg 4 times a day. Some pain relief but spasticity is worse, will trial low dose tizanidine and will reduce lisinopril to compensate for BP decrease  4. Mood:Provide emotional support 5. Neuropsych: This patientiscapable of making decisions on herown behalf. 6. Skin/Wound Care:Routine skin checks, d/c abd dressing on THurs 7. Fluids/Electrolytes/Nutrition:Routine I&O's   BMP within acceptable range on 11/22 8.Acute blood loss anemia.   Hb 9.6 on 11/22  Continue to monitor  9.Hypertension. Lisinopril 10 mg daily-monitor on current dose    Vitals:   08/17/17 1403 08/18/17 0513  BP: (!) 100/45 (!) 141/56  Pulse: 78 76  Resp: 20 18  Temp: 98.5 F (36.9 C) 97.9 F (36.6 C)  SpO2: 100% 98%   10.Diabetes mellitus peripheral neuropathy. Hemoglobin A1c 6.7. Glucophage 500 mg twice a day. Check blood sugars before meals and at bedtime CBG (last 3)   Recent Labs    08/17/17 1644 08/17/17 2106 08/18/17 0648  GLUCAP 104* 132* 114*      controlled 12/6 11.Constipation. Laxative assistance, increased on 11/18, increased again on 11/21 numerous bowel movements yesterday--.   Hold Senokot as per patient request   12.Remote tobacco abuse. Counseling 13. Hypoalbuminemia  Supplement initiated on 11/17   14.  Urinary retention cont flomax, limited dose urecholine due to low BP, may need to reduce if BP gets too low 15.  Neurogenic bowel less freq with reduction of  miralax to daily LOS (Days) 21 A FACE TO FACE EVALUATION WAS PERFORMED  Charlett Blake 08/18/2017, 7:44 AM

## 2017-08-18 NOTE — Plan of Care (Signed)
Pt remains with neurogenic B/B States pain controlled

## 2017-08-18 NOTE — Discharge Summary (Signed)
Megan [name], Megan [name]              ACCOUNT NO.:  [id number]  MEDICAL RECORD NO.:  [id number]  LOCATION:  4M12C                        FACILITY:  [facility]  PHYSICIAN:  Charlett Blake, M.D.DATE OF BIRTH:  Sep 29, 1950  DATE OF ADMISSION:  07/28/2017 DATE OF DISCHARGE:  08/19/2017                              DISCHARGE SUMMARY   DISCHARGE DIAGNOSES: 1. Decreased functional mobility secondary to lumbar spondylosis with     radiculopathy. 2. Sequential compression devices for deep vein thrombosis     prophylaxis. 3. Pain management. 4. Acute blood loss anemia. 5. Neurogenic bowel and bladder. 6. Hypertension. 7. Diabetes mellitus. 8. Peripheral neuropathy. 9. Remote tobacco abuse.  This is a 66 year old right-handed female with history of hypertension, diabetes mellitus, tobacco abuse, multiple prior thoracic lumbar surgeries, receiving inpatient rehab services in the past.  Lives with spouse, 1-level home, ramped entrance.  Spouse is self employed and can assist on discharge.  The patient with progressive weakness over the last few months, requiring a scooter for household, community ambulation.  Last physical medicine rehab visit in August 2018.  Still had right hip flexor and ankle dorsiflexor weakness, was taking Neurontin for paresthesias, completing outpatient therapy.  The patient noted increased left lower extremity weakness, evaluated by Neurosurgery on June 05, 2017.  Underwent thoracic lumbar MRI, showed new left L5-S1 protrusion, compressing S1 nerve root.  Admitted July 26, 2017, with progressive back pain, lower extremity weakness.  X-rays revealed lumbosacral spondylosis, radiculopathy.  Underwent L5-S1 anterior retroperitoneal exposure, L5-S1 laminectomy, L5-S1 posterior lateral fixation with fusion on July 26, 2017, per Dr. Cyndy Freeze. Hospital course, pain management.  Back brace when out of bed.  Acute blood loss anemia, 9.1.  Monitoring of orthostasis.   Neurogenic bowel and bladder.  The patient was admitted for comprehensive rehab program.  PAST MEDICAL HISTORY:  See discharge diagnoses.  SOCIAL HISTORY:  Lives with spouse.  Had been using a motorized scooter for household ambulation prior to admission.  FUNCTIONAL STATUS UPON ADMISSION TO REHAB SERVICES:  +2 physical assist sit to stand, +2 physical assist side lying to sitting, mod to max assist activities of daily living.  PHYSICAL EXAMINATION:  VITAL SIGNS:  Blood pressure 146/70, pulse 98, temperature 98, and respirations 20. GENERAL:  Alert female, in no acute distress. HEENT:  EOMs intact. NECK:  Supple, nontender.  No JVD. CARDIAC:  Rate controlled. ABDOMEN:  Soft, nontender.  Good bowel sounds. LUNGS:  Clear to auscultation without wheeze. EXTREMITIES:  Bilateral upper extremity strength 5/5 deltoid, biceps, triceps, and grip.  Left lower extremity 4/5 hip flexors, knee extension.  4+/5 ankle dorsi and plantarflexion.  Right lower extremity hip flexors 2/5, ankle dorsiflexion 4-/5.  Sensation diminished to light touch below the knees bilaterally.  REHABILITATION HOSPITAL COURSE:  The patient was admitted to Inpatient Rehab Services.  Therapies initiated on a 3-hour daily basis, consisting of physical therapy, occupational therapy, and rehabilitation nursing. The following issues were addressed during the patient's rehabilitation stay.  Pertaining to Ms. Pellegrino's radiculopathy and spondylosis, she had undergone L5-S1 anterior retroperitoneal exposure, laminectomy, fusion with noted history of multiple thoracic lumbar surgeries. Surgical site healing nicely.  Back brace when out of bed.  She would  follow up with Neurosurgery.  SCDs for DVT prophylaxis, venous Doppler studies negative.  Pain management with the use of Celebrex.  Neurontin 300 mg 3 times daily, hydrocodone as needed for pain.  She was using Robaxin for muscle spasms as well as the addition of Zanaflex.   Blood pressures controlled, monitoring for orthostasis.  She remained on low- dose lisinopril.  Acute blood loss anemia, stable at 9.6.  Neurogenic bowel and bladder with intermittent catheterizations every 6-8 hours as well as neurogenic bowel program provided by her husband.Out patient referral made to urology services.  She would continue on low-dose Urecholine as well as Flomax for now.  Full family teaching had been completed.  Blood sugars monitored, hemoglobin A1c of 6.7.  She remained on Glucophage.  The patient received weekly collaborative interdisciplinary team conferences to discuss estimated length of stay, family teaching, any barriers to discharge.  She completed wheelchair mobility and a standard wheelchair with bilateral upper extremities with increased time.  Working with energy conservation techniques, monitoring of blood pressure.  When out of bed, she could propel her wheelchair modified independent in a controlled environment. Engaged wheelchair mobility teaching ongoing with family.  Transferred wheelchair to mat with supervision.  Standing for 2 minutes in standing frame.  Discussed home bathing, dressing, hygiene management with ongoing teaching with family.  She completes lower body bathing, dressing, bed level.  She is able to roll side to side to wash her backside and clean her peri area with only simple setup.  She could don her pants with head of bed elevated and setup assistance.  The patient was discharged to home.  DISCHARGE MEDICATIONS: 1. Lipitor 20 mg p.o. daily. 2. Urecholine 5 mg p.o. t.i.d. 3. Dulcolax suppository every evening. 4. Celebrex 200 mg every 12 hours. 5. Vitamin D 2000 units daily. 6. Neurontin 300 mg p.o. t.i.d. 7. Lisinopril 5 mg p.o. daily. 8. Glucophage 500 mg p.o. b.i.d. 9. Protonix 40 mg p.o. daily. 10.MiraLAX daily. 11.Flomax 0.4 mg every evening. 12.Zanaflex 2 mg p.o. 3 times daily. 13.Hydrocodone 1 tablet every 6 hours as  needed pain.  DIET:  Her diet was a diabetic diet.  FOLLOWUP:  The patient would follow up with Dr. Alysia Penna at the Outpatient Rehab Service office as advised; Dr. Marland Kitchen Ditty, Neurosurgery, call for appointment; Dr. Alma Friendly, Medical Management.Out patient referral to Urology  SPECIAL INSTRUCTIONS:  Back brace when out of bed, applied in sitting position.  In and out catheterization every 6-8 hours.  Continue neurogenic bowel program.  No smoking.     Lauraine Rinne, P.A.   ______________________________ Charlett Blake, M.D.    DA/MEDQ  D:  08/18/2017  T:  08/18/2017  Job:  [id number]  cc:   Charlett Blake, M.D. Dr. Alma Friendly Dr. Marland Kitchen Ditty

## 2017-08-18 NOTE — Plan of Care (Signed)
Pt's daughter performed Urine Catheterization Continues with bowel program suppository Pain controlled with current plan of care Skin free from breakdown Wounds healing without complications

## 2017-08-18 NOTE — Discharge Instructions (Signed)
Inpatient Rehab Discharge Instructions  [place] Discharge date and time: No discharge date for patient encounter.   Activities/Precautions/ Functional Status: Activity: Back brace when out of bed applied in sitting position Diet: diabetic diet Wound Care: keep wound clean and dry Functional status:  ___ No restrictions     ___ Walk up steps independently ___ 24/7 supervision/assistance   ___ Walk up steps with assistance ___ Intermittent supervision/assistance  ___ Bathe/dress independently ___ Walk with walker     _x__ Bathe/dress with assistance ___ Walk Independently    ___ Shower independently ___ Walk with assistance    ___ Shower with assistance ___ No alcohol     ___ Return to work/school ________  COMMUNITY REFERRALS UPON DISCHARGE:   Home Health:   PT     OT     RN  Agency:  [facility] Phone:  [phone] Medical Equipment/Items Ordered:  18"x18" ultra hemi, lightweight wheelchair  Agency/Supplier:  [facility]       Phone:  [phone]  Special Instructions: No smoking  In and out catheterizations every 6-8 hours  Continue neurogenic bowel program   My questions have been answered and I understand these instructions. I will adhere to these goals and the provided educational materials after my discharge from the hospital.  Patient/Caregiver Signature _______________________________ Date __________  Clinician Signature _______________________________________ Date __________  Please bring this form and your medication list with you to all your follow-up doctor's appointments.

## 2017-08-18 NOTE — Progress Notes (Signed)
Pt's daughter was able to perform urinary straight catheterization without difficulty and with good technique.  Daughter declined to give suppository as she needed to leave for home and she stated , " I've given suppositories before". Pt states husband will be giving her the suppositories at night. Husband not available tonight to perform task.

## 2017-08-18 NOTE — Discharge Summary (Signed)
Discharge summary job [phone]

## 2017-08-18 NOTE — Progress Notes (Signed)
Physical Therapy Session Note  Patient Details  Name: Megan [name] MRN: [id number] Date of Birth: 1950/09/22  Today's Date: 08/18/2017 PT Individual Time: 0930-1028 PT Individual Time Calculation (min): 58 min   Short Term Goals: Week 3:  PT Short Term Goal 1 (Week 3): =LTGs due to ELOS  Skilled Therapeutic Interventions/Progress Updates:    D/c planning discussed throughout session in preparation for d/c tomorrow in regards to equipment, custom w/c features, and overall home set-up. W/c mobility on unit and home environment modified independent in ultralightweight manual w/c. Ramp negotiation training for community mobility with min assist to navigate threshold and verbal cues for efficient propulsion technique (hand placement and anterior weightshift). Simulated car transfer with overall min assist for RLE assist in/out of car. Bed mobility and transfer training to simulate home environment ([place] slightly elevated) with supervision to transfer for safety and min assist for bed mobility (for RLE management). Pt demonstrating good safety awareness of set up of all transfers. NMR on nustep for reciprocal movement pattern re-training and strengthening with BLE mainly on level 2 x 10 min. Pt complete transfers with close supervision squat pivot on/off of nustep. Returned to room end of session with all needs in reach.   Therapy Documentation Precautions:  Precautions Precautions: Back, Fall Precaution Booklet Issued: Yes (comment) Precaution Comments: pt able to 3/3 back precautions Required Braces or Orthoses: Spinal Brace, Other Brace/Splint Spinal Brace: Lumbar corset, Applied in sitting position Other Brace/Splint: AFO RLE Restrictions Weight Bearing Restrictions: No  Pain: Denies pain beside chronic back pain.    See Function Navigator for Current Functional Status.   Therapy/Group: Individual Therapy  Canary Brim Ivory Broad, PT, DPT  08/18/2017, 10:28 AM

## 2017-08-18 NOTE — Progress Notes (Signed)
Physical Therapy Discharge Summary  Patient Details  Name: Megan [name] MRN: [id number] Date of Birth: 1951-05-12  Today's Date: 08/18/2017 PT Individual Time: 1100-1200 PT Individual Time Calculation (min): 60 min    Patient has met 9 of 9 long term goals due to improved activity tolerance, improved balance, increased strength and ability to compensate for deficits.  Patient to discharge at a wheelchair level Supervision, min assist for any standing activity.   Patient's care partner is independent to provide the necessary physical assistance at discharge.  Recommendation:  Patient will benefit from ongoing skilled PT services in home health setting to continue to advance safe functional mobility, address ongoing impairments in strength, coordination, and activity tolerance, and minimize fall risk.  Equipment: 18x18 ultra-lightweight w/c   Reasons for discharge: treatment goals met  Patient/family agrees with progress made and goals achieved: Yes   Skilled PT Intervention: No c/o pain.  Session focus on d/c assessment (see below), activity tolerance, d/c planning for w/c.   Pt completes sit<>stand from low w/c with min assist.  Gait x10' with RW and supervision, w/c follow for safety.  PT and pt discussed options for ultra-lightweight w/c for d/c.  Community level w/c mobility with mod I.  Pt returned to room at end of session, supervision for lateral scoot back to bed.   PT Discharge Precautions/Restrictions Precautions Precautions: Back;Fall Precaution Comments: pt able to 3/3 back precautions Required Braces or Orthoses: Spinal Brace;Other Brace/Splint Spinal Brace: Lumbar corset;Applied in sitting position Other Brace/Splint: AFO RLE Pain Pain Assessment Pain Assessment: No/denies pain Pain Score: 0-No pain Pain Intervention(s): Medication (See eMAR) Vision/Perception  Perception Perception: Within Functional Limits Praxis Praxis: Intact  Cognition Overall  Cognitive Status: Within Functional Limits for tasks assessed Arousal/Alertness: Awake/alert Orientation Level: Oriented X4 Sustained Attention: Appears intact Memory: Appears intact Awareness: Appears intact Problem Solving: Appears intact Safety/Judgment: Appears intact Sensation Sensation Light Touch: Impaired Detail Light Touch Impaired Details: Absent RLE(baseline) Proprioception: Appears Intact Additional Comments: pt with deep touch sensation in RLE Coordination Coordination and Movement Description: paraparesis, improving in LLE>RLE Motor  Motor Motor: Paraplegia Motor - Discharge Observations: paraparesis RLE>LLE  Mobility Bed Mobility Bed Mobility: Supine to Sit;Rolling Right Rolling Right: 5: Supervision Supine to Sit: 4: Min assist Sit to Supine: 4: Min guard Transfers Transfers: Yes Sit to Stand: 4: Min assist Stand to Sit: 4: Min Teacher, English as a foreign language Transfers: 5: Supervision Lateral/Scoot Transfers: 5: Supervision Locomotion  Ambulation Ambulation: Yes Ambulation/Gait Assistance: 5: Supervision Ambulation Distance (Feet): 10 Feet Assistive device: Rolling walker Stairs / Additional Locomotion Ramp: 6: Modified independent (Device)(w/c level) Product manager Mobility: Yes Wheelchair Assistance: 6: Modified independent (Device/Increase time) Environmental health practitioner: Both upper extremities Distance: 300  Trunk/Postural Assessment  Cervical Assessment Cervical Assessment: Within Functional Limits Thoracic Assessment Thoracic Assessment: Within Functional Limits Lumbar Assessment Lumbar Assessment: (LSO) Postural Control Postural Control: Within Functional Limits  Balance Balance Balance Assessed: Yes Dynamic Sitting Balance Dynamic Sitting - Level of Assistance: 6: Modified independent (Device/Increase time) Dynamic Standing Balance Dynamic Standing - Level of Assistance: 4: Min assist Extremity Assessment      RLE Assessment RLE  Assessment: Exceptions to [facility] RLE Strength Right Hip Flexion: 3/5 Right Knee Flexion: 4/5 Right Knee Extension: 3/5 LLE Assessment LLE Assessment: Within Functional Limits(4/5 proximal to distal)   See Function Navigator for Current Functional Status.  Michel Santee 08/18/2017, 12:11 PM

## 2017-08-19 LAB — GLUCOSE, CAPILLARY: Glucose-Capillary: 110 mg/dL — ABNORMAL HIGH (ref 65–99)

## 2017-08-19 MED ORDER — BETHANECHOL CHLORIDE 5 MG PO TABS: 5.0000 mg | ORAL_TABLET | Freq: Three times a day (TID) | ORAL | 0 refills | Status: DC

## 2017-08-19 MED ORDER — TIZANIDINE HCL 2 MG PO TABS: 2.0000 mg | ORAL_TABLET | Freq: Three times a day (TID) | ORAL | 0 refills | Status: DC

## 2017-08-19 MED ORDER — POLYETHYLENE GLYCOL 3350 17 G PO PACK: 17.0000 g | PACK | Freq: Every day | ORAL | 0 refills | Status: DC

## 2017-08-19 MED ORDER — ESOMEPRAZOLE MAGNESIUM 20 MG PO CPDR: 20.0000 mg | DELAYED_RELEASE_CAPSULE | Freq: Every day | ORAL | 0 refills | Status: DC

## 2017-08-19 MED ORDER — ATORVASTATIN CALCIUM 20 MG PO TABS: 20.0000 mg | ORAL_TABLET | Freq: Every evening | ORAL | 3 refills | Status: DC

## 2017-08-19 MED ORDER — GABAPENTIN 300 MG PO CAPS: 300.0000 mg | ORAL_CAPSULE | Freq: Three times a day (TID) | ORAL | 2 refills | Status: DC

## 2017-08-19 MED ORDER — CELECOXIB 200 MG PO CAPS: 200.0000 mg | ORAL_CAPSULE | Freq: Two times a day (BID) | ORAL | 0 refills | Status: DC

## 2017-08-19 MED ORDER — VITAMIN D3 25 MCG (1000 UNIT) PO TABS: 2000.0000 [IU] | ORAL_TABLET | Freq: Every day | ORAL | 0 refills | Status: DC

## 2017-08-19 MED ORDER — METFORMIN HCL 500 MG PO TABS: 500.0000 mg | ORAL_TABLET | Freq: Two times a day (BID) | ORAL | 2 refills | Status: DC

## 2017-08-19 MED ORDER — TAMSULOSIN HCL 0.4 MG PO CAPS: 0.4000 mg | ORAL_CAPSULE | Freq: Every day | ORAL | 0 refills | Status: DC

## 2017-08-19 MED ORDER — LISINOPRIL 5 MG PO TABS: 5.0000 mg | ORAL_TABLET | Freq: Every day | ORAL | 0 refills | Status: DC

## 2017-08-19 MED ORDER — HYDROCODONE-ACETAMINOPHEN 5-325 MG PO TABS: 1.0000 | ORAL_TABLET | Freq: Four times a day (QID) | ORAL | 0 refills | Status: DC | PRN

## 2017-08-19 MED ORDER — BISACODYL 10 MG RE SUPP: 10.0000 mg | Freq: Every evening | RECTAL | 0 refills | Status: DC

## 2017-08-19 MED ORDER — BISACODYL 5 MG PO TBEC: 5.0000 mg | DELAYED_RELEASE_TABLET | Freq: Every day | ORAL | 0 refills | Status: DC | PRN

## 2017-08-19 NOTE — Progress Notes (Addendum)
Subjective/Complaints:  Feels ok , asking about catheters and WC   ROS: pt denies nausea, vomiting, diarrhea, cough, shortness of breath or chest pain   Objective:  Vital Signs: Blood pressure 138/65, pulse 81, temperature 97.7 F (36.5 C), temperature source Oral, resp. rate 18, height 5\' 8"  (1.727 m), weight 73.4 kg (161 lb 12.8 oz), SpO2 98 %. No results found. Results for orders placed or performed during the hospital encounter of 07/28/17 (from the past 72 hour(s))  Glucose, capillary     Status: Abnormal   Collection Time: 08/16/17 11:30 AM  Result Value Ref Range   Glucose-Capillary 114 (H) 65 - 99 mg/dL  Glucose, capillary     Status: Abnormal   Collection Time: 08/16/17  4:35 PM  Result Value Ref Range   Glucose-Capillary 102 (H) 65 - 99 mg/dL  Glucose, capillary     Status: Abnormal   Collection Time: 08/16/17  8:50 PM  Result Value Ref Range   Glucose-Capillary 112 (H) 65 - 99 mg/dL  Glucose, capillary     Status: Abnormal   Collection Time: 08/17/17  6:45 AM  Result Value Ref Range   Glucose-Capillary 118 (H) 65 - 99 mg/dL  Glucose, capillary     Status: None   Collection Time: 08/17/17 12:09 PM  Result Value Ref Range   Glucose-Capillary 95 65 - 99 mg/dL  Glucose, capillary     Status: Abnormal   Collection Time: 08/17/17  4:44 PM  Result Value Ref Range   Glucose-Capillary 104 (H) 65 - 99 mg/dL  Glucose, capillary     Status: Abnormal   Collection Time: 08/17/17  9:06 PM  Result Value Ref Range   Glucose-Capillary 132 (H) 65 - 99 mg/dL   Comment 1 Notify RN   Glucose, capillary     Status: Abnormal   Collection Time: 08/18/17  6:48 AM  Result Value Ref Range   Glucose-Capillary 114 (H) 65 - 99 mg/dL  Glucose, capillary     Status: Abnormal   Collection Time: 08/18/17 11:55 AM  Result Value Ref Range   Glucose-Capillary 104 (H) 65 - 99 mg/dL  Glucose, capillary     Status: Abnormal   Collection Time: 08/18/17  4:23 PM  Result Value Ref Range   Glucose-Capillary 115 (H) 65 - 99 mg/dL  Glucose, capillary     Status: Abnormal   Collection Time: 08/18/17  9:18 PM  Result Value Ref Range   Glucose-Capillary 112 (H) 65 - 99 mg/dL  Glucose, capillary     Status: Abnormal   Collection Time: 08/19/17  6:24 AM  Result Value Ref Range   Glucose-Capillary 110 (H) 65 - 99 mg/dL     HEENT: Normocephalic. Atraumatic. Cardio: RRR without murmur. No JVD  Resp: CTA B/L and unlabored GI: BS positive and ND Skin:   Intact. Warm and dry.Mild erythema at lower incision with 2cm superficial dehiscence no active drainage Abd incision with honeycomb dressing , CDI, staples Neuro: Alert/Oriented Motor 5/5 in BUE RLE: 3-/5 HF, KE, ADF (limited still by spasticity 4-/5 Left HF, KE, ADF (limited by spasticity) Tone MAS 3 in R hamstring Musc/Skel:  No edema. No tenderness. Gen NAD. Vital signs reviewed.    Assessment/Plan: 1. Functional deficits secondary to paraparesis  Stable for D/C today F/u PCP in 3-4 weeks F/u PM&R 2 weeks F/u NS 1 wk See D/C summary See D/C instructions FIM: Function - Bathing Position: Shower Body parts bathed by patient: Right arm, Abdomen, Front perineal area, Chest, Left  arm, Buttocks, Right upper leg, Left upper leg, Right lower leg, Left lower leg Body parts bathed by helper: Front perineal area, Buttocks, Back Bathing not applicable: Back, Right lower leg, Left lower leg Assist Level: Supervision or verbal cues Assistive Device Comment: LH sponge  Function- Upper Body Dressing/Undressing What is the patient wearing?: Bra, Pull over shirt/dress, Orthosis Bra - Perfomed by patient: Hook/unhook bra (pull down sports bra), Thread/unthread right bra strap, Thread/unthread left bra strap Bra - Perfomed by helper: Hook/unhook bra (pull down sports bra) Pull over shirt/dress - Perfomed by patient: Thread/unthread right sleeve, Thread/unthread left sleeve, Pull shirt over trunk, Put head through opening Pull over  shirt/dress - Perfomed by helper: Put head through opening Button up shirt - Perfomed by patient: Thread/unthread right sleeve, Pull shirt around back, Button/unbutton shirt, Thread/unthread left sleeve Orthosis activity level: Performed by patient Assist Level: More than reasonable time Set up : To obtain clothing/put away Function - Lower Body Dressing/Undressing What is the patient wearing?: Pants, Ted Hose, AFO, Shoes Position: Sitting EOB(and in bed) Pants- Performed by patient: Thread/unthread right pants leg, Thread/unthread left pants leg, Pull pants up/down Pants- Performed by helper: Pull pants up/down, Thread/unthread right pants leg, Thread/unthread left pants leg Non-skid slipper socks- Performed by helper: Don/doff left sock, Don/doff right sock Socks - Performed by patient: Don/doff left sock, Don/doff right sock Socks - Performed by helper: Don/doff right sock Shoes - Performed by patient: Don/doff right shoe, Don/doff left shoe, Fasten right, Fasten left Shoes - Performed by helper: Fasten right, Don/doff right shoe AFO - Performed by patient: Don/doff right AFO AFO - Performed by helper: Don/doff right AFO TED Hose - Performed by patient: Don/doff right TED hose, Don/doff left TED hose Assist for footwear: Setup Assist for lower body dressing: Set up, More than reasonable time Set up : To obtain clothing/put away  Function - Toileting Toileting activity did not occur: No continent bowel/bladder event Toileting steps completed by patient: Adjust clothing prior to toileting, Performs perineal hygiene Toileting steps completed by helper: Adjust clothing after toileting Toileting Assistive Devices: Grab bar or rail Assist level: Touching or steadying assistance (Pt.75%)  Function - Air cabin crew transfer activity did not occur: Refused Toilet transfer assistive device: Drop arm commode, Elevated toilet seat/BSC over toilet, Grab bar Mechanical lift: (patient  preference) Assist level to toilet: Touching or steadying assistance (Pt > 75%) Assist level from toilet: Touching or steadying assistance (Pt > 75%)  Function - Chair/bed transfer Chair/bed transfer method: Squat pivot Chair/bed transfer assist level: Supervision or verbal cues Chair/bed transfer assistive device: Armrests, Orthosis Mechanical lift: Stedy Chair/bed transfer details: Manual facilitation for weight shifting, Verbal cues for technique  Function - Locomotion: Wheelchair Will patient use wheelchair at discharge?: Yes Type: Manual Max wheelchair distance: 150' Assist Level: No help, No cues, assistive device, takes more than reasonable amount of time Assist Level: No help, No cues, assistive device, takes more than reasonable amount of time Assist Level: No help, No cues, assistive device, takes more than reasonable amount of time Turns around,maneuvers to table,bed, and toilet,negotiates 3% grade,maneuvers on rugs and over doorsills: Yes Function - Locomotion: Ambulation Ambulation activity did not occur: Safety/medical concerns Assistive device: Walker-rolling Max distance: 10 Assist level: Supervision or verbal cues Assist level: Supervision or verbal cues Walk 50 feet with 2 turns activity did not occur: Safety/medical concerns Walk 150 feet activity did not occur: Safety/medical concerns Walk 10 feet on uneven surfaces activity did not occur: Safety/medical concerns  Function - Comprehension Comprehension: Auditory Comprehension assist level: Follows complex conversation/direction with extra time/assistive device  Function - Expression Expression: Verbal Expression assist level: Expresses complex ideas: With extra time/assistive device  Function - Social Interaction Social Interaction assist level: Interacts appropriately with others with medication or extra time (anti-anxiety, antidepressant).  Function - Problem Solving Problem solving assist level: Solves  basic problems with no assist  Function - Memory Memory assist level: Complete Independence: No helper Patient normally able to recall (first 3 days only): Current season, Location of own room, That he or she is in a hospital, Staff names and faces  Medical Problem List and Plan: 1.Decreased functional mobilitysecondary to lumbar spondylosis with radiculopathy. Status post L5-S1 anterior retroperitoneal exposure with laminectomy and fusion 07/26/2017 as well as history of multiple thoracic lumbar surgeries. Back brace when out of bed   CIR PT, OT-  Plan d/c today     2. DVT Prophylaxis/Anticoagulation: SCDs.    Vascular study negative for DVT.  3. Pain Management:Celebrex 200 mg every 12 hours, Neurontin 300 mg 3 times a day, hydrocodone as needed  Baclofen changed back to Robaxin 1000 mg 4 times a day. Some pain relief but spasticity is better low dose tizanidine 4. Mood:Provide emotional support 5. Neuropsych: This patientiscapable of making decisions on herown behalf. 6. Skin/Wound Care:Routine skin checks, d/c abd dressing on THurs 7. Fluids/Electrolytes/Nutrition:Routine I&O's   BMP within acceptable range on 11/22 8.Acute blood loss anemia.   Hb 9.6 on 11/22  Continue to monitor  9.Hypertension. Lisinopril 10 mg daily-monitor on current dose    Vitals:   08/18/17 1431 08/19/17 0514  BP: (!) 100/46 138/65  Pulse: 73 81  Resp: 20 18  Temp: 97.7 F (36.5 C) 97.7 F (36.5 C)  SpO2: 100% 98%   10.Diabetes mellitus peripheral neuropathy. Hemoglobin A1c 6.7. Glucophage 500 mg twice a day. Check blood sugars before meals and at bedtime CBG (last 3)   Recent Labs    08/18/17 1623 08/18/17 2118 08/19/17 0624  GLUCAP 115* 112* 110*      controlled 12/7 11.Constipation. Laxative assistance, increased on 11/18, increased again on 11/21 numerous bowel movements yesterday--.  Hold Senokot as per patient request   12.Remote tobacco abuse. Counseling 13.  Hypoalbuminemia  Supplement initiated on 11/17   14.  Urinary retention cont flomax, limited dose urecholine due to low BP,15.  Neurogenic bowel less freq with reduction of  miralax to daily LOS (Days) 22   Pt mobility assessment by MD and mobility assessment per Physical therapy reviewed and agree with recommendation for ultralight manual wheel chair A FACE TO [place] 08/19/2017, 7:26 AM

## 2017-08-19 NOTE — Progress Notes (Signed)
Social Work Patient ID: Megan [name], female   DOB: Mar 20, 1951, 66 y.o.   MRN: [id number]   CSW met with pt on 08-17-17 to update her on team conference discussion.  Pt feel comfortable with going home this week and is ready.  Pt's family has been educated and they will be helping pt at home.  Pt is appreciative of time on CIR.  CSW is setting up [facility] and PT is ordering specialty w/c.  CSW remains available as needed.

## 2017-08-19 NOTE — Progress Notes (Signed)
Pt given discharge instructions via PA Jethro Bolus PAC. Pt discharged to home with husband and all belongings.

## 2017-08-19 NOTE — Patient Care Conference (Signed)
Inpatient RehabilitationTeam Conference and Plan of Care Update Date: 08/19/2017   Time: 11:43 AM    Patient Name: Megan [name]      Medical Record Number: [id number]  Date of Birth: 1951/06/11 Sex: Female         Room/Bed: 4M12C/4M12C-01 Payor Info: Payor: [place] / Plan: [place] / Product Type: *No Product type* /    Admitting Diagnosis: lumbosacral fusion  Admit Date/Time:  07/28/2017  3:33 PM Admission Comments: No comment available   Primary Diagnosis:  <principal problem not specified> Principal Problem: <principal problem not specified>  Patient Active Problem List   Diagnosis Date Noted  . Constipation due to pain medication   . Muscle spasms of both lower extremities   . Type 2 diabetes mellitus with peripheral neuropathy (HCC)   . Hypoalbuminemia due to protein-calorie malnutrition ([place])   . Radiculopathy 07/28/2017  . Fusion of spine, lumbosacral region   . Acute blood loss anemia   . Benign essential HTN   . Diabetes mellitus type 2 in obese ([place])   . Lumbosacral spondylosis with radiculopathy 07/26/2017  . Sensation of pressure in bladder area 01/31/2017  . Paraplegia, incomplete ([place]) 01/07/2017  . Neurogenic bladder 12/27/2016  . 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/27/2016  . Myelopathy ([place]) 12/21/2016  . Surgery, elective   . Diabetes mellitus type 2 in nonobese (HCC)   . History of lumbar fusion   . History of fusion of cervical spine   . Neuropathic pain   . Post-operative pain   . 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 Paraparesis ([place]) 12/15/2016  . Pedal edema 12/14/2016  . Type 2 diabetes mellitus without complication, without long-term current use of insulin ([place]) 11/02/2016  . Hyperlipidemia 11/02/2016  . Essential hypertension 11/02/2016  . 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    Expected Discharge Date: Expected Discharge Date: 08/19/17  Team  Members Present: Physician leading conference: Dr. Alysia Penna Social Worker Present: Alfonse Alpers, LCSW Nurse Present: Arelia Sneddon, RN PT Present: Dwyane Dee, PT OT Present: Cherylynn Ridges, OT SLP Present: Weston Anna, SLP PPS Coordinator present : Daiva Nakayama, RN, CRRN     Current Status/Progress Goal Weekly Team Focus  Medical   RLE spasticity , ankle clonus and hamstring LLE strength  establish bowel and bladder cont  spasticity management   Bowel/Bladder   patient voids but still requiring q 8 caths because of high volume, lBM 12-5 bowel program   family able to cath/ and insert suppository , patient maintain regular bowel progrma  Daughter and aspouse educated husband performed suppository insertion   Swallow/Nutrition/ Hydration             ADL's   Min A overall, supervision for squat-pivot  Min A/supervision  modified bathing/dressing, pt/family education, LB/UB strengthening    Mobility   supervision to min guard for sit<>stand, limited gait (up to 7') with close w/c follow and min guard  downgraded to min assist for standing activities, and supervision for squat/pivot transfers  continuing to progress gait as able, standing balance/tolerance, w/c mobility    Communication             Safety/Cognition/ Behavioral Observations            Pain   pain managed with norco 1-2 prn  pain < or = 4  Assess pain q shift and prn   Skin   abdominal incision with honeycomb / back incision  OTA cdi  Assess skin q shift and prn  Assess skin q shift and prn    Rehab Goals Patient on target to meet rehab goals: Yes Rehab Goals Revised: gait goal downgraded *See Care Plan and progress notes for long and short-term goals.     Barriers to Discharge  Current Status/Progress Possible Resolutions Date Resolved   Physician    Inaccessible home environment;Medical stability;Incontinence;Neurogenic Bowel & Bladder  RLE flexor withdrawl  progressing toward goals  cont  rehab and engage Husband with training      Nursing                  PT                    OT                  SLP                SW                Discharge Planning/Teaching Needs:  Patient's daughter, husband, neighbor, and friend to assist pt at home at min A level.  family education has been completed and will be ongoing, as needed   Team Discussion:  Pt is starting to void, but will likely still need in and out cath every 6-8 hours.  Spasticity is still an issue for pt and Dr. Letta Pate is considering botox for pt.  He also started tizanidine and will monitor pt's BP and sedation level with starting this new medication.  Pt is doing a suppository at night and family is trained on cathing.  Some goals were downgraded for gait with therapists only.  Pt is doing great with ADLs and knows how to modify things at home.  Some tasks to be done at bed level to give pt more independence.  Revisions to Treatment Plan:  none    Continued Need for Acute Rehabilitation Level of Care: The patient requires daily medical management by a physician with specialized training in physical medicine and rehabilitation for the following conditions: Daily direction of a multidisciplinary physical rehabilitation program to ensure safe treatment while eliciting the highest outcome that is of practical value to the patient.: Yes Daily medical management of patient stability for increased activity during participation in an intensive rehabilitation regime.: Yes Daily analysis of laboratory values and/or radiology reports with any subsequent need for medication adjustment of medical intervention for : Neurological problems;Urological problems  Vern Prestia, Silvestre Mesi 08/19/2017, 11:43 AM

## 2017-08-19 NOTE — Progress Notes (Signed)
Social Work Discharge Note  The overall goal for the admission was met for:   Discharge location: Yes - home with family  Length of Stay: Yes - 22 days  Discharge activity level: Yes - supervision to minimal assistance  Home/community participation: Yes  Services provided included: MD, RD, PT, OT, RN, Pharmacy, Neuropsych and SW  Financial Services: Private Insurance: [employer]     Follow-up services arranged: Home Health: PT/OT/RN from [facility], DME: 18"x18" ultra lightweight w/c and Patient/Family request agency HH: [facility], DME: [place]  Comments (or additional information):  Patient/Family verbalized understanding of follow-up arrangements: Yes  Individual responsible for coordination of the follow-up plan: pt, husband, dtr  Confirmed correct DME delivered: Trey Sailors 08/19/2017    Mikinzie Maciejewski, Silvestre Mesi

## 2017-08-23 ENCOUNTER — Encounter: Payer: Self-pay | Admitting: Physical Therapy

## 2017-08-23 NOTE — Therapy (Signed)
[facility] [address] [place], [place], [address] Phone: [phone]   Fax:  [phone]  Patient Details  Name: Megan [name] MRN: [id number] Date of Birth: July 21, 1951 Referring Provider:  No ref. provider found  Encounter Date: 08/23/2017  PHYSICAL THERAPY DISCHARGE SUMMARY  Visits from Start of Care: 16 (last OP PT visit 05/17/17, as 05/24/17 visit cancelled due to continued knee instability and recommended pt follow up with MD)  Current functional level related to goals / functional outcomes: PT Short Term Goals - 04/28/17 1238      PT SHORT TERM GOAL #1   Title  Pt will be independent with HEP for improved strength, balance, and gait.  TARGET 04/22/17    Time  5    Period  Weeks    Status  On-going      PT SHORT TERM GOAL #2   Title  Pt will improve Berg score to at least 28/56 for decreased fall risk.    Baseline  04/18/17: 30/56 scored today    Status  Achieved      PT SHORT TERM GOAL #3   Title  Pt will improve TUG score to less than or equal to 19 seconds for decreased fall risk.    Baseline  04/18/17: 16.69 sec's with rollator    Status  Achieved      PT SHORT TERM GOAL #4   Title  Pt will perform at least 8 of 10 reps of sit<>stand transfers with minimal UE support, modified independently and no LOB, for improved safety and efficiency of transfers.    Time  5    Period  Weeks    Status  Achieved      PT SHORT TERM GOAL #5   Title  Pt will ambulate at least 300 ft using rollator walker, supervision , for improved gait efficiency and safety.    Baseline  04/18/17: met today    Status  Achieved      LTGs not fully able to be assessed, as pt recommended to f/u with MD-pt ultimately scheduled for additional surgery.   Remaining deficits: Weakness, balance, gait   Education / Equipment: HEP  Plan: Patient agrees to discharge.  Patient goals were partially met. Patient is being discharged due to a  change in medical status.  ?????Pt scheduled for surgery; no further PT after 05/17/17 and 05/24/17 PT visit.       Lemon Whitacre W. 08/23/2017, 11:38 AM Mady Haagensen, PT 08/23/17 11:40 AM Phone: [phone] Fax: [facility] [address] [place], [place], [address] Phone: [phone]   Fax:  [phone]

## 2017-08-25 ENCOUNTER — Encounter: Payer: Self-pay | Admitting: Internal Medicine

## 2017-08-25 ENCOUNTER — Ambulatory Visit: Payer: BLUE CROSS/BLUE SHIELD | Admitting: Internal Medicine

## 2017-08-25 VITALS — BP 124/76 | HR 92 | Temp 98.2°F

## 2017-08-25 DIAGNOSIS — Z981 Arthrodesis status: Secondary | ICD-10-CM

## 2017-08-25 DIAGNOSIS — M48061 Spinal stenosis, lumbar region without neurogenic claudication: Secondary | ICD-10-CM

## 2017-08-25 DIAGNOSIS — K592 Neurogenic bowel, not elsewhere classified: Secondary | ICD-10-CM

## 2017-08-25 DIAGNOSIS — N319 Neuromuscular dysfunction of bladder, unspecified: Secondary | ICD-10-CM | POA: Diagnosis not present

## 2017-08-25 DIAGNOSIS — M5416 Radiculopathy, lumbar region: Secondary | ICD-10-CM

## 2017-08-25 NOTE — Progress Notes (Signed)
Subjective:    Patient ID: Megan [name], female    DOB: 09-Nov-1950, 66 y.o.   MRN: [id number]  HPI  Pt presents to the clinic today for hospital follow up . She went to the ER for a planned surgery 11/15. She had a L5-S1 laminectomy, fixation and fusion for treatment of LSS with radiculopathy by Dr. Cyndy Freeze. 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 She did extensive inpatient rehab. She was treated with Celebrex, Gabapentin, Hydrocodone, Zanafelx , Urecholine and Flomax. She was discharged on 12/7. Since discharge, she has been able to do some standing exercises at home. Home health and PT have not come out, delayed due to the weather. She feels like her pain is well controlled with her current regimen. She has not completely regained control of her bowels, but she is wearing depends in case of accidents. She reports she is still having to be cathed every 8 hours. She has her follow up with Dr. Letta Pate and Dr. Cyndy Freeze already scheduled.  Review of Systems      Past Medical History:  Diagnosis Date  . Arthritis    "hands, back" (07/26/2017)  . Dyspnea    W/ PHYS CONDITION   . GERD (gastroesophageal reflux disease)   . History of bronchitis    "not since I quit smoking" (07/26/2017)  . History of kidney stones   . History of shingles   . Hyperlipidemia    takes Fish Oil daily  . Hypertension   . Neuromuscular disorder (H[facility])    tingling toes  . 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 Pneumonia 2009  . PONV (postoperative nausea and vomiting)   . Restless leg   . Type 2 diabetes mellitus ([place])   . Uterine cancer ([place]) 1979   S/P hysterectomy  . Weakness    numbness and tingling in both feet r/t back    Current Outpatient Medications  Medication Sig Dispense Refill  . atorvastatin (LIPITOR) 20 MG tablet Take 1 tablet (20 mg total) by mouth every evening. 90 tablet 3  . Biotin 5000 MCG CAPS Take 5,000 mcg daily by mouth.    . bisacodyl (DULCOLAX) 5  MG EC tablet Take 1 tablet (5 mg total) by mouth daily as needed for moderate constipation. 30 tablet 0  . celecoxib (CELEBREX) 200 MG capsule Take 1 capsule (200 mg total) by mouth every 12 (twelve) hours. 60 capsule 0  . cholecalciferol (VITAMIN D) 1000 units tablet Take 2 tablets (2,000 Units total) by mouth daily after lunch. 30 tablet 0  . Coenzyme Q10 (COQ10) 100 MG CAPS Take 100 mg daily by mouth.     . esomeprazole (NEXIUM 24HR) 20 MG capsule Take 1 capsule (20 mg total) by mouth daily at 12 noon. 30 capsule 0  . gabapentin (NEURONTIN) 300 MG capsule Take 1 capsule (300 mg total) by mouth 3 (three) times daily. 90 capsule 2  . Garlic 101 MG CAPS Take 500 mg 2 (two) times daily by mouth.    Marland Kitchen HYDROcodone-acetaminophen (NORCO/VICODIN) 5-325 MG tablet Take 1-2 tablets by mouth every 6 (six) hours as needed for moderate pain. 20 tablet 0  . lisinopril (PRINIVIL,ZESTRIL) 5 MG tablet Take 1 tablet (5 mg total) by mouth daily. 30 tablet 0  . metFORMIN (GLUCOPHAGE) 500 MG tablet Take 1 tablet (500 mg total) by mouth 2 (two) times daily with a meal. 180 tablet 2  . polyethylene glycol (MIRALAX / GLYCOLAX) packet Take 17 g by mouth  daily. 14 each 0  . senna-docusate (SENOKOT-S) 8.6-50 MG tablet Take 2 tablets by mouth 2 (two) times daily.    . tamsulosin (FLOMAX) 0.4 MG CAPS capsule Take 1 capsule (0.4 mg total) by mouth daily after supper. 30 capsule 0  . tiZANidine (ZANAFLEX) 2 MG tablet Take 1 tablet (2 mg total) by mouth 3 (three) times daily. 90 tablet 0   No current facility-administered medications for this visit.     Allergies  Allergen Reactions  . Shellfish Allergy Anaphylaxis, Swelling and Other (See Comments)    Tongue swells  . Oxycodone Nausea Only  . Penicillins Rash and Other (See Comments)    Has patient had a PCN reaction causing immediate rash, facial/tongue/throat swelling, SOB or lightheadedness with hypotension: Yes Has patient had a PCN reaction causing severe rash  involving mucus membranes or skin necrosis: No Has patient had a PCN reaction that required hospitalization No Has patient had a PCN reaction occurring within the last 10 years: No If all of the above answers are "NO", then may proceed with Cephalosporin use.    Family History  Problem Relation Age of Onset  . Diabetes Mother   . Dementia Mother   . Cirrhosis Mother        Non alcoholic  . COPD Father   . Diabetes Brother     Social History   Socioeconomic History  . Marital status: Married    Spouse name: [name]  . Number of children: 1  . Years of education: 63  . Highest education level: Not on file  Social Needs  . Financial resource strain: Not on file  . Food insecurity - worry: Not on file  . Food insecurity - inability: Not on file  . Transportation needs - medical: Not on file  . Transportation needs - non-medical: Not on file  Occupational History  . Not on file  Tobacco Use  . Smoking status: Former Smoker    Packs/day: 1.50    Years: 37.00    Pack years: 55.50    Types: Cigarettes    Last attempt to quit: 05/13/2006    Years since quitting: 11.2  . Smokeless tobacco: Never Used  Substance and Sexual Activity  . Alcohol use: Yes    Comment: 07/26/2017 "glass of wine once/wk if not on RX"  . Drug use: No  . Sexual activity: Not Currently    Birth control/protection: Surgical  Other Topics Concern  . Not on file  Social History Narrative   Lives w/ husband   Married.   1 child, 1 grandchildren.   Retired. Once worked for [employer].   Enjoys reading.      Constitutional: Denies fever, malaise, fatigue, headache or abrupt weight changes.  Gastrointestinal: Pt reports difficulty with bowel control. Denies abdominal pain, bloating, constipation, diarrhea or blood in the stool.  GU: Pt reports difficulty with urinary control. Denies urgency, frequency, pain with urination, burning sensation, blood in urine, odor or discharge. Musculoskeletal: Pt  reports back pain and weakness in legs.  Neurological: Pt reports difficulty with balance and coordination. Denies dizziness, difficulty with memory, difficulty with speech.    No other specific complaints in a complete review of systems (except as listed in HPI above).  Objective:   Physical Exam   BP 124/76   Pulse 92   Temp 98.2 F (36.8 C) (Oral)   SpO2 100%  Wt Readings from Last 3 Encounters:  08/17/17 161 lb 12.8 oz (73.4 kg)  07/26/17 177  lb (80.3 kg)  07/21/17 177 lb 8 oz (80.5 kg)    General: Appears her stated age, in NAD. Skin: Incision site nicely healed. Abdomen: Soft and nontender. Normal bowel sounds. No distention or masses noted.  Musculoskeletal: Unable to assess, she can not get out of WC onto exam table.  Neurological: Alert and oriented. Sensation intact but decreased BLE.   BMET    Component Value Date/Time   NA 132 (L) 08/12/2017 0731   K 5.0 08/12/2017 0731   CL 101 08/12/2017 0731   CO2 23 08/12/2017 0731   GLUCOSE 169 (H) 08/12/2017 0731   BUN 13 08/12/2017 0731   CREATININE 1.06 (H) 08/12/2017 0731   CALCIUM 8.9 08/12/2017 0731   GFRNONAA 54 (L) 08/12/2017 0731   GFRAA >60 08/12/2017 0731    Lipid Panel     Component Value Date/Time   CHOL 169 01/31/2017 0846   TRIG 89.0 01/31/2017 0846   HDL 63.50 01/31/2017 0846   CHOLHDL 3 01/31/2017 0846   VLDL 17.8 01/31/2017 0846   LDLCALC 87 01/31/2017 0846    CBC    Component Value Date/Time   WBC 6.4 08/04/2017 0440   RBC 3.33 (L) 08/04/2017 0440   HGB 9.6 (L) 08/04/2017 0440   HCT 29.6 (L) 08/04/2017 0440   PLT 332 08/04/2017 0440   MCV 88.9 08/04/2017 0440   MCH 28.8 08/04/2017 0440   MCHC 32.4 08/04/2017 0440   RDW 13.2 08/04/2017 0440   LYMPHSABS 1.7 08/04/2017 0440   MONOABS 0.5 08/04/2017 0440   EOSABS 0.4 08/04/2017 0440   BASOSABS 0.0 08/04/2017 0440    Hgb A1C Lab Results  Component Value Date   HGBA1C 6.7 (H) 07/21/2017           Assessment & Plan:    Hospital Follow Up for L5-S1 Laminectomy/Fusion for LSS complicated by Neurogenic Bowel and Bladder:  Hospital notes, labs and imaging reviewed She plans to start with home PT tomorrow She will continue all medications as currently prescribed She will follow up with Dr. Cyndy Freeze and Dr. Letta Pate as scheduled  Return precautions discussed Webb Silversmith, NP

## 2017-08-26 ENCOUNTER — Telehealth: Payer: Self-pay | Admitting: Primary Care

## 2017-08-26 NOTE — Telephone Encounter (Signed)
Left detailed msg on VM per HIPAA  

## 2017-08-26 NOTE — Telephone Encounter (Signed)
Copied from [place]. Topic: General - Other >> Aug 26, 2017  4:10 PM Neva Seat wrote: [place] [phone] - ok to leave a message  Verbal for OT 1 a week for 5 weeks - including this week

## 2017-08-26 NOTE — Telephone Encounter (Signed)
[place] for verbal orders for OT

## 2017-08-29 ENCOUNTER — Encounter: Payer: Self-pay | Admitting: Internal Medicine

## 2017-08-29 ENCOUNTER — Other Ambulatory Visit: Payer: Self-pay

## 2017-08-29 ENCOUNTER — Encounter (HOSPITAL_COMMUNITY): Payer: Self-pay | Admitting: Emergency Medicine

## 2017-08-29 ENCOUNTER — Inpatient Hospital Stay (HOSPITAL_COMMUNITY)
Admission: EM | Admit: 2017-08-29 | Discharge: 2017-09-07 | DRG: 460 | Disposition: A | Discharge status: Rehabilitation Facility | Payer: BLUE CROSS/BLUE SHIELD | Attending: Neurological Surgery | Admitting: Neurological Surgery

## 2017-08-29 ENCOUNTER — Emergency Department (HOSPITAL_COMMUNITY): Payer: BLUE CROSS/BLUE SHIELD

## 2017-08-29 DIAGNOSIS — R5381 Other malaise: Secondary | ICD-10-CM

## 2017-08-29 DIAGNOSIS — D638 Anemia in other chronic diseases classified elsewhere: Secondary | ICD-10-CM | POA: Diagnosis present

## 2017-08-29 DIAGNOSIS — Z91013 Allergy to seafood: Secondary | ICD-10-CM

## 2017-08-29 DIAGNOSIS — T8489XA Other specified complication of internal orthopedic prosthetic devices, implants and grafts, initial encounter: Secondary | ICD-10-CM | POA: Diagnosis present

## 2017-08-29 DIAGNOSIS — K5900 Constipation, unspecified: Secondary | ICD-10-CM | POA: Diagnosis present

## 2017-08-29 DIAGNOSIS — Z981 Arthrodesis status: Secondary | ICD-10-CM | POA: Diagnosis not present

## 2017-08-29 DIAGNOSIS — R110 Nausea: Secondary | ICD-10-CM | POA: Diagnosis present

## 2017-08-29 DIAGNOSIS — E876 Hypokalemia: Secondary | ICD-10-CM

## 2017-08-29 DIAGNOSIS — S32039A Unspecified fracture of third lumbar vertebra, initial encounter for closed fracture: Secondary | ICD-10-CM | POA: Diagnosis not present

## 2017-08-29 DIAGNOSIS — Z419 Encounter for procedure for purposes other than remedying health state, unspecified: Secondary | ICD-10-CM

## 2017-08-29 DIAGNOSIS — I10 Essential (primary) hypertension: Secondary | ICD-10-CM | POA: Diagnosis present

## 2017-08-29 DIAGNOSIS — Z01811 Encounter for preprocedural respiratory examination: Secondary | ICD-10-CM | POA: Diagnosis not present

## 2017-08-29 DIAGNOSIS — Z825 Family history of asthma and other chronic lower respiratory diseases: Secondary | ICD-10-CM

## 2017-08-29 DIAGNOSIS — N390 Urinary tract infection, site not specified: Secondary | ICD-10-CM

## 2017-08-29 DIAGNOSIS — M532X6 Spinal instabilities, lumbar region: Secondary | ICD-10-CM | POA: Diagnosis present

## 2017-08-29 DIAGNOSIS — E1142 Type 2 diabetes mellitus with diabetic polyneuropathy: Secondary | ICD-10-CM | POA: Diagnosis present

## 2017-08-29 DIAGNOSIS — N319 Neuromuscular dysfunction of bladder, unspecified: Secondary | ICD-10-CM | POA: Diagnosis present

## 2017-08-29 DIAGNOSIS — M5415 Radiculopathy, thoracolumbar region: Secondary | ICD-10-CM | POA: Diagnosis not present

## 2017-08-29 DIAGNOSIS — Z4789 Encounter for other orthopedic aftercare: Secondary | ICD-10-CM | POA: Diagnosis not present

## 2017-08-29 DIAGNOSIS — K5641 Fecal impaction: Secondary | ICD-10-CM | POA: Diagnosis not present

## 2017-08-29 DIAGNOSIS — M8448XA Pathological fracture, other site, initial encounter for fracture: Secondary | ICD-10-CM | POA: Diagnosis present

## 2017-08-29 DIAGNOSIS — B9620 Unspecified Escherichia coli [E. coli] as the cause of diseases classified elsewhere: Secondary | ICD-10-CM | POA: Diagnosis present

## 2017-08-29 DIAGNOSIS — K219 Gastro-esophageal reflux disease without esophagitis: Secondary | ICD-10-CM | POA: Diagnosis present

## 2017-08-29 DIAGNOSIS — Z87442 Personal history of urinary calculi: Secondary | ICD-10-CM

## 2017-08-29 DIAGNOSIS — E119 Type 2 diabetes mellitus without complications: Secondary | ICD-10-CM

## 2017-08-29 DIAGNOSIS — Z87891 Personal history of nicotine dependence: Secondary | ICD-10-CM

## 2017-08-29 DIAGNOSIS — M48061 Spinal stenosis, lumbar region without neurogenic claudication: Secondary | ICD-10-CM | POA: Diagnosis present

## 2017-08-29 DIAGNOSIS — G252 Other specified forms of tremor: Secondary | ICD-10-CM | POA: Diagnosis present

## 2017-08-29 DIAGNOSIS — R109 Unspecified abdominal pain: Secondary | ICD-10-CM | POA: Diagnosis not present

## 2017-08-29 DIAGNOSIS — R112 Nausea with vomiting, unspecified: Secondary | ICD-10-CM | POA: Diagnosis present

## 2017-08-29 DIAGNOSIS — Z7984 Long term (current) use of oral hypoglycemic drugs: Secondary | ICD-10-CM

## 2017-08-29 DIAGNOSIS — Y838 Other surgical procedures as the cause of abnormal reaction of the patient, or of later complication, without mention of misadventure at the time of the procedure: Secondary | ICD-10-CM | POA: Diagnosis present

## 2017-08-29 DIAGNOSIS — E785 Hyperlipidemia, unspecified: Secondary | ICD-10-CM | POA: Diagnosis not present

## 2017-08-29 DIAGNOSIS — M9669 Fracture of other bone following insertion of orthopedic implant, joint prosthesis, or bone plate: Secondary | ICD-10-CM | POA: Diagnosis present

## 2017-08-29 DIAGNOSIS — D62 Acute posthemorrhagic anemia: Secondary | ICD-10-CM | POA: Diagnosis present

## 2017-08-29 DIAGNOSIS — K592 Neurogenic bowel, not elsewhere classified: Secondary | ICD-10-CM | POA: Diagnosis not present

## 2017-08-29 DIAGNOSIS — M960 Pseudarthrosis after fusion or arthrodesis: Secondary | ICD-10-CM | POA: Diagnosis present

## 2017-08-29 DIAGNOSIS — S32039D Unspecified fracture of third lumbar vertebra, subsequent encounter for fracture with routine healing: Secondary | ICD-10-CM | POA: Diagnosis not present

## 2017-08-29 DIAGNOSIS — Z880 Allergy status to penicillin: Secondary | ICD-10-CM

## 2017-08-29 DIAGNOSIS — G894 Chronic pain syndrome: Secondary | ICD-10-CM

## 2017-08-29 DIAGNOSIS — M5416 Radiculopathy, lumbar region: Secondary | ICD-10-CM | POA: Diagnosis present

## 2017-08-29 DIAGNOSIS — M7989 Other specified soft tissue disorders: Secondary | ICD-10-CM | POA: Diagnosis not present

## 2017-08-29 DIAGNOSIS — Z833 Family history of diabetes mellitus: Secondary | ICD-10-CM

## 2017-08-29 DIAGNOSIS — G8918 Other acute postprocedural pain: Secondary | ICD-10-CM | POA: Diagnosis not present

## 2017-08-29 DIAGNOSIS — E1165 Type 2 diabetes mellitus with hyperglycemia: Secondary | ICD-10-CM

## 2017-08-29 DIAGNOSIS — S32039S Unspecified fracture of third lumbar vertebra, sequela: Secondary | ICD-10-CM | POA: Diagnosis not present

## 2017-08-29 DIAGNOSIS — Z79899 Other long term (current) drug therapy: Secondary | ICD-10-CM | POA: Diagnosis not present

## 2017-08-29 DIAGNOSIS — E118 Type 2 diabetes mellitus with unspecified complications: Secondary | ICD-10-CM | POA: Diagnosis not present

## 2017-08-29 DIAGNOSIS — R338 Other retention of urine: Secondary | ICD-10-CM | POA: Diagnosis not present

## 2017-08-29 DIAGNOSIS — G8220 Paraplegia, unspecified: Secondary | ICD-10-CM | POA: Diagnosis not present

## 2017-08-29 DIAGNOSIS — G959 Disease of spinal cord, unspecified: Secondary | ICD-10-CM | POA: Diagnosis not present

## 2017-08-29 LAB — URINALYSIS, MICROSCOPIC (REFLEX)

## 2017-08-29 LAB — URINALYSIS, ROUTINE W REFLEX MICROSCOPIC
Bilirubin Urine: NEGATIVE
Bilirubin Urine: NEGATIVE
GLUCOSE, UA: NEGATIVE mg/dL
Glucose, UA: NEGATIVE mg/dL
KETONES UR: 5 mg/dL — AB
Ketones, ur: NEGATIVE mg/dL
NITRITE: NEGATIVE
Nitrite: NEGATIVE
PH: 6 (ref 5.0–8.0)
Protein, ur: NEGATIVE mg/dL
Protein, ur: 30 mg/dL — AB
SPECIFIC GRAVITY, URINE: 1.018 (ref 1.005–1.030)
Specific Gravity, Urine: <1.005 — ABNORMAL LOW (ref 1.005–1.030)
Squamous Epithelial / LPF: NONE SEEN
pH: 6.5 (ref 5.0–8.0)

## 2017-08-29 LAB — CBC WITH DIFFERENTIAL/PLATELET
BASOS ABS: 0 10*3/uL (ref 0.0–0.1)
BASOS PCT: 0 %
EOS ABS: 0 10*3/uL (ref 0.0–0.7)
Eosinophils Relative: 0 %
HCT: 32.3 % — ABNORMAL LOW (ref 36.0–46.0)
HEMOGLOBIN: 10.6 g/dL — AB (ref 12.0–15.0)
Lymphocytes Relative: 5 %
Lymphs Abs: 0.4 10*3/uL — ABNORMAL LOW (ref 0.7–4.0)
MCH: 27.9 pg (ref 26.0–34.0)
MCHC: 32.8 g/dL (ref 30.0–36.0)
MCV: 85 fL (ref 78.0–100.0)
Monocytes Absolute: 1 10*3/uL (ref 0.1–1.0)
Monocytes Relative: 12 %
NEUTROS PCT: 83 %
Neutro Abs: 6.8 10*3/uL (ref 1.7–7.7)
Platelets: 222 10*3/uL (ref 150–400)
RBC: 3.8 MIL/uL — AB (ref 3.87–5.11)
RDW: 14.1 % (ref 11.5–15.5)
WBC: 8.1 10*3/uL (ref 4.0–10.5)

## 2017-08-29 LAB — COMPREHENSIVE METABOLIC PANEL
ALK PHOS: 144 U/L — AB (ref 38–126)
ALT: 13 U/L — ABNORMAL LOW (ref 14–54)
ANION GAP: 11 (ref 5–15)
AST: 16 U/L (ref 15–41)
Albumin: 3.1 g/dL — ABNORMAL LOW (ref 3.5–5.0)
BILIRUBIN TOTAL: 0.9 mg/dL (ref 0.3–1.2)
BUN: 10 mg/dL (ref 6–20)
CALCIUM: 8.8 mg/dL — AB (ref 8.9–10.3)
CO2: 22 mmol/L (ref 22–32)
Chloride: 101 mmol/L (ref 101–111)
Creatinine, Ser: 0.66 mg/dL (ref 0.44–1.00)
GFR calc non Af Amer: >60 mL/min (ref >60)
Glucose, Bld: 184 mg/dL — ABNORMAL HIGH (ref 65–99)
Potassium: 3.8 mmol/L (ref 3.5–5.1)
SODIUM: 134 mmol/L — AB (ref 135–145)
TOTAL PROTEIN: 6.5 g/dL (ref 6.5–8.1)

## 2017-08-29 LAB — LIPASE, BLOOD: Lipase: 30 U/L (ref 11–51)

## 2017-08-29 MED ORDER — SENNA 8.6 MG PO TABS
1.0000 | ORAL_TABLET | Freq: Two times a day (BID) | ORAL | Status: DC
  Administered 2017-08-30 – 2017-09-01 (×6): 8.6 mg via ORAL
  Filled 2017-08-29 (×8): qty 1

## 2017-08-29 MED ORDER — LISINOPRIL 5 MG PO TABS
5.0000 mg | ORAL_TABLET | Freq: Every day | ORAL | Status: DC
  Administered 2017-08-30 – 2017-09-07 (×6): 5 mg via ORAL
  Filled 2017-08-29 (×10): qty 1

## 2017-08-29 MED ORDER — BISACODYL 5 MG PO TBEC: 5.0000 mg | DELAYED_RELEASE_TABLET | Freq: Every day | ORAL | Status: DC | PRN

## 2017-08-29 MED ORDER — GABAPENTIN 300 MG PO CAPS
300.0000 mg | ORAL_CAPSULE | Freq: Three times a day (TID) | ORAL | Status: DC
  Administered 2017-08-30 – 2017-09-02 (×11): 300 mg via ORAL
  Filled 2017-08-29 (×12): qty 1

## 2017-08-29 MED ORDER — SENNOSIDES-DOCUSATE SODIUM 8.6-50 MG PO TABS: 1.0000 | ORAL_TABLET | Freq: Every evening | ORAL | Status: DC | PRN

## 2017-08-29 MED ORDER — SODIUM CHLORIDE 0.9% FLUSH
3.0000 mL | Freq: Two times a day (BID) | INTRAVENOUS | Status: DC
  Administered 2017-08-30 – 2017-08-31 (×2): 3 mL via INTRAVENOUS

## 2017-08-29 MED ORDER — MILK AND MOLASSES ENEMA
1.0000 | Freq: Once | RECTAL | Status: AC
  Administered 2017-08-29: 250 mL via RECTAL
  Filled 2017-08-29 (×2): qty 250

## 2017-08-29 MED ORDER — ONDANSETRON HCL 4 MG/2ML IJ SOLN
4.0000 mg | Freq: Four times a day (QID) | INTRAMUSCULAR | Status: DC | PRN
  Administered 2017-08-29 – 2017-09-01 (×4): 4 mg via INTRAVENOUS
  Filled 2017-08-29 (×4): qty 2

## 2017-08-29 MED ORDER — GARLIC 500 MG PO CAPS: 500.0000 mg | ORAL_CAPSULE | Freq: Two times a day (BID) | ORAL | Status: DC

## 2017-08-29 MED ORDER — MAGNESIUM CITRATE PO SOLN: 1.0000 | Freq: Once | ORAL | Status: DC | PRN

## 2017-08-29 MED ORDER — SODIUM CHLORIDE 0.9 % IV SOLN: 250.0000 mL | INTRAVENOUS | Status: DC | PRN

## 2017-08-29 MED ORDER — HYDROMORPHONE HCL 1 MG/ML IJ SOLN
1.0000 mg | INTRAMUSCULAR | Status: DC | PRN
  Administered 2017-09-02: 1 mg via INTRAVENOUS
  Filled 2017-08-29: qty 1

## 2017-08-29 MED ORDER — COQ10 100 MG PO CAPS: 100.0000 mg | ORAL_CAPSULE | Freq: Every day | ORAL | Status: DC

## 2017-08-29 MED ORDER — ONDANSETRON HCL 4 MG PO TABS: 4.0000 mg | ORAL_TABLET | Freq: Four times a day (QID) | ORAL | Status: DC | PRN

## 2017-08-29 MED ORDER — ACETAMINOPHEN 325 MG PO TABS
650.0000 mg | ORAL_TABLET | Freq: Four times a day (QID) | ORAL | Status: DC | PRN
  Administered 2017-08-30: 650 mg via ORAL
  Filled 2017-08-29: qty 2

## 2017-08-29 MED ORDER — SODIUM CHLORIDE 0.9% FLUSH: 3.0000 mL | INTRAVENOUS | Status: DC | PRN

## 2017-08-29 MED ORDER — BIOTIN 5000 MCG PO CAPS: 5000.0000 ug | ORAL_CAPSULE | ORAL | Status: DC

## 2017-08-29 MED ORDER — CELECOXIB 200 MG PO CAPS
200.0000 mg | ORAL_CAPSULE | Freq: Two times a day (BID) | ORAL | Status: DC
  Administered 2017-08-30 – 2017-09-01 (×7): 200 mg via ORAL
  Filled 2017-08-29 (×8): qty 1

## 2017-08-29 MED ORDER — PROCHLORPERAZINE EDISYLATE 5 MG/ML IJ SOLN
5.0000 mg | Freq: Four times a day (QID) | INTRAMUSCULAR | Status: DC | PRN
  Administered 2017-08-29: 5 mg via INTRAVENOUS

## 2017-08-29 MED ORDER — ACETAMINOPHEN 650 MG RE SUPP: 650.0000 mg | Freq: Four times a day (QID) | RECTAL | Status: DC | PRN

## 2017-08-29 MED ORDER — VITAMIN D3 25 MCG (1000 UNIT) PO TABS
2000.0000 [IU] | ORAL_TABLET | Freq: Every day | ORAL | Status: DC
  Administered 2017-08-29 – 2017-09-01 (×4): 2000 [IU] via ORAL
  Filled 2017-08-29 (×7): qty 2

## 2017-08-29 MED ORDER — METFORMIN HCL 500 MG PO TABS
500.0000 mg | ORAL_TABLET | Freq: Two times a day (BID) | ORAL | Status: DC
  Administered 2017-08-30 (×2): 500 mg via ORAL
  Filled 2017-08-29 (×2): qty 1

## 2017-08-29 MED ORDER — HYDROCODONE-ACETAMINOPHEN 5-325 MG PO TABS
1.0000 | ORAL_TABLET | ORAL | Status: DC | PRN
  Administered 2017-08-31 – 2017-09-02 (×4): 2 via ORAL
  Filled 2017-08-29 (×4): qty 2

## 2017-08-29 MED ORDER — FENTANYL CITRATE (PF) 100 MCG/2ML IJ SOLN
25.0000 ug | Freq: Once | INTRAMUSCULAR | Status: AC
  Administered 2017-08-29: 25 ug via INTRAVENOUS
  Filled 2017-08-29: qty 2

## 2017-08-29 MED ORDER — TIZANIDINE HCL 4 MG PO TABS
2.0000 mg | ORAL_TABLET | Freq: Three times a day (TID) | ORAL | Status: DC
  Administered 2017-08-30 – 2017-09-02 (×11): 2 mg via ORAL
  Filled 2017-08-29 (×12): qty 1

## 2017-08-29 MED ORDER — BETHANECHOL CHLORIDE 10 MG PO TABS
5.0000 mg | ORAL_TABLET | Freq: Three times a day (TID) | ORAL | Status: DC
  Administered 2017-08-30 – 2017-09-07 (×26): 5 mg via ORAL
  Filled 2017-08-29 (×29): qty 1

## 2017-08-29 MED ORDER — PANTOPRAZOLE SODIUM 40 MG PO TBEC
40.0000 mg | DELAYED_RELEASE_TABLET | Freq: Every day | ORAL | Status: DC
  Administered 2017-08-30 – 2017-09-07 (×8): 40 mg via ORAL
  Filled 2017-08-29 (×9): qty 1

## 2017-08-29 MED ORDER — SODIUM CHLORIDE 0.9 % IV SOLN: INTRAVENOUS | Status: DC

## 2017-08-29 MED ORDER — ZOLPIDEM TARTRATE 5 MG PO TABS: 5.0000 mg | ORAL_TABLET | Freq: Every evening | ORAL | Status: DC | PRN

## 2017-08-29 MED ORDER — TAMSULOSIN HCL 0.4 MG PO CAPS
0.4000 mg | ORAL_CAPSULE | Freq: Every day | ORAL | Status: DC
  Administered 2017-08-29 – 2017-09-07 (×10): 0.4 mg via ORAL
  Filled 2017-08-29 (×10): qty 1

## 2017-08-29 MED ORDER — SODIUM CHLORIDE 0.9 % IV SOLN
INTRAVENOUS | Status: DC
  Administered 2017-08-29 – 2017-08-31 (×3): via INTRAVENOUS
  Administered 2017-09-01: 1000 mL via INTRAVENOUS
  Administered 2017-09-01 (×2): via INTRAVENOUS
  Administered 2017-09-02: 500 mL via INTRAVENOUS

## 2017-08-29 MED ORDER — INSULIN ASPART 100 UNIT/ML ~~LOC~~ SOLN
0.0000 [IU] | Freq: Three times a day (TID) | SUBCUTANEOUS | Status: DC
  Administered 2017-08-30: 1 [IU] via SUBCUTANEOUS
  Administered 2017-08-30: 2 [IU] via SUBCUTANEOUS
  Administered 2017-08-30: 1 [IU] via SUBCUTANEOUS

## 2017-08-29 MED ORDER — ONDANSETRON HCL 4 MG/2ML IJ SOLN
4.0000 mg | Freq: Once | INTRAMUSCULAR | Status: AC
  Administered 2017-08-29: 4 mg via INTRAVENOUS
  Filled 2017-08-29: qty 2

## 2017-08-29 MED ORDER — IOPAMIDOL (ISOVUE-300) INJECTION 61%
INTRAVENOUS | Status: AC
  Administered 2017-08-29: 75 mL
  Filled 2017-08-29: qty 75

## 2017-08-29 MED ORDER — DEXTROSE 5 % IV SOLN
1.0000 g | INTRAVENOUS | Status: DC
  Administered 2017-08-30 – 2017-09-04 (×7): 1 g via INTRAVENOUS
  Filled 2017-08-29 (×7): qty 10

## 2017-08-29 MED ORDER — ATORVASTATIN CALCIUM 10 MG PO TABS
20.0000 mg | ORAL_TABLET | Freq: Every evening | ORAL | Status: DC
  Administered 2017-08-29 – 2017-09-07 (×10): 20 mg via ORAL
  Filled 2017-08-29 (×5): qty 2
  Filled 2017-08-29: qty 1
  Filled 2017-08-29: qty 2
  Filled 2017-08-29 (×3): qty 1
  Filled 2017-08-29 (×2): qty 2
  Filled 2017-08-29: qty 1
  Filled 2017-08-29 (×2): qty 2

## 2017-08-29 NOTE — ED Notes (Signed)
Neuro MD at bedside at this time.

## 2017-08-29 NOTE — ED Notes (Signed)
Dr. Vanita Panda at bedside at this time.

## 2017-08-29 NOTE — Patient Instructions (Signed)
Neurogenic Bladder Neurogenic bladder is a bladder control disorder. It is caused by problems with the nerves that control your bladder. This condition may make your bladder overactive or underactive. You may have trouble holding your urine or passing your urine (urinating). The bladder is a hollow organ in the lower part of your abdomen. It stores urine after the urine is made by your kidneys. Normally, when your bladder is not full, the muscles that control your bladder are relaxed. When your bladder fills with urine, nerve signals are sent to your brain, indicating that your bladder is full. Your brain then sends signals through your spinal cord to the muscles in your bladder that start and stop urine flow. If you have neurogenic bladder, the nerves and muscles do not work together the way they should. What are the causes? Any kind of nerve damage or condition that disrupts the signals from your brain to your bladder can cause neurogenic bladder. Many things can cause these nerve problems. They include:  A disease that affects the nervous system, such as: ? Alzheimer disease. ? Cerebral palsy. ? Multiple sclerosis. ? Diabetes. ? Parkinson disease.  Damage to your brain or spinal cord from: ? Trauma. ? Tumor. ? Infection. ? Surgery. ? Alcohol abuse. ? Stroke. ? A spinal cord birth defect.  What increases the risk? Having nerve damage or a nerve disorder puts you at risk for neurogenic bladder. What are the signs or symptoms? Signs and symptoms of neurogenic bladder include:  Leaking or gushing urine (incontinence).  A sudden, strong urge to pass urine (urgency).  Frequent urination during the day and night.  Being unable to empty your bladder completely (urinary retention).  Frequent urinary tract infections.  How is this diagnosed? Your health care provider may diagnose neurogenic bladder based on your symptoms and medical history. A physical exam will also be done. You may  be asked to keep a record of your bladder symptoms and the times that you urinate (bladder diary). Your health care provider may also do several tests to help diagnose neurogenic bladder, including:  A urine test to check for infection.  A bladder scan after you urinate to see how much urine is left in your bladder.  Various tests to measure your urine flow and see how well the flow is controlled (urodynamic tests).  A procedure that involves using a tool that is like a very thin telescope to look through your urethra into your bladder (cystoscopy). A health care provider who specializes in the urinary tract (urologist) may do this test.  Imaging tests of your brain or spine.  How is this treated? Treatment depends on the cause of your neurogenic bladder and the symptoms you are having. Work closely with your health care provider to find the treatments that will improve your quality of life. Treatment options include:  Learning ways to control when you urinate, such as: ? Urinating at scheduled times. ? Training yourself to delay urination. ? Doing exercises (Kegel exercises) to strengthen the muscles that control urine flow. ? Avoiding foods or drinks that make your symptoms worse.  Taking medicines to: ? Stimulate an underactive bladder. ? Relax an overactive bladder. ? Treat a urinary tract infection.  Learning how to use a thin tube (catheter) to empty your bladder. A catheter is a hollow tube that you pass through your urethra.  Procedures to stimulate the nerves that control your bladder.  Surgical procedures if other treatments do not help.  Follow these instructions  at home:  Keep a bladder diary to find out which foods, liquids, or activities make your symptoms worse.  Use your bladder diary to schedule bathroom trips. If you are away from home, plan to be near a bathroom when your schedule says you need one.  Do Kegel exercises to strengthen the muscles that control  urination. These muscles are the ones you use to try to hold urine when you need to go. To do Kegel exercises: ? Squeeze these muscles tight and hold for about 10 seconds. ? Repeat three times. ? Do these exercises often during the day when you do not have to urinate.  Limit your drinking of beverages that stimulate urination. These include soda, coffee, and tea.  After urinating, wait a few minutes and try again (double voiding).  Make sure you urinate just before you leave the house and just before you go to bed.  Take medicines only as directed by your health care provider.  Keep all follow-up visits as directed by your health care provider. This is important. Contact a health care provider if:  You are having a hard time controlling your symptoms.  Your symptoms are getting worse.  You have signs of a urinary tract infection: ? A burning feeling when you urinate. ? Chills. ? Fever. Get help right away if: You cannot pass urine. This information is not intended to replace advice given to you by your health care provider. Make sure you discuss any questions you have with your health care provider. Document Released: 03/13/2007 Document Revised: 02/05/2016 Document Reviewed: 12/11/2013 Elsevier Interactive Patient Education  2018 [employer].

## 2017-08-29 NOTE — ED Provider Notes (Signed)
[facility] EMERGENCY DEPARTMENT Provider Note   CSN: [id number] Arrival date & time: 08/29/17  0802     History   Chief Complaint Chief Complaint  Patient presents with  . Nausea  . Emesis    HPI RAZIYA AVENI is a 66 y.o. female.  HPI Presents with concern of nausea, vomiting, abdominal pain. Onset was about 3 days ago, since onset she has had multiple episodes of diarrhea, with only transient improvement with Imodium. There is associated anorexia, fever. Pain is focally in the lower abdomen, sore, nonradiating, crampy, worse with defecation. Patient has a notable history of back surgery, but had been doing generally well prior to the onset of symptoms. She spent 3 weeks in rehabilitation, but was discharged home 1 week ago.  Update: Family members not present. The patient has been in particular notes the patient has not had actual diarrhea, but has had firm stool produced during this illness, with the aforementioned transient stoppage after using Imodium. They note that the patient was discharged from rehabilitation 1 week ago, in spite of being able to walk only a few steps with a walker, being unable to fully extend her right leg, and with ongoing tremor in her right lower extremity. These have not changed since discharge from rehab 1 week ago. They confirm that the patient has not had additional fall, and that she is wearing her TLSO brace, as instructed, except when lying down.   Past Medical History:  Diagnosis Date  . Arthritis    "hands, back" (07/26/2017)  . Dyspnea    W/ PHYS CONDITION   . GERD (gastroesophageal reflux disease)   . History of bronchitis    "not since I quit smoking" (07/26/2017)  . History of kidney stones   . History of shingles   . Hyperlipidemia    takes Fish Oil daily  . Hypertension   . Neuromuscular disorder (HCC)    tingling toes  . 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 Pneumonia 2009  . PONV  (postoperative nausea and vomiting)   . Restless leg   . Type 2 diabetes mellitus ([place])   . Uterine cancer ([place]) 1979   S/P hysterectomy  . Weakness    numbness and tingling in both feet r/t back    Patient Active Problem List   Diagnosis Date Noted  . Constipation due to pain medication   . Muscle spasms of both lower extremities   . Type 2 diabetes mellitus with peripheral neuropathy (HCC)   . Hypoalbuminemia due to protein-calorie malnutrition ([place])   . Radiculopathy 07/28/2017  . Fusion of spine, lumbosacral region   . Acute blood loss anemia   . Benign essential HTN   . Diabetes mellitus type 2 in obese ([place])   . Lumbosacral spondylosis with radiculopathy 07/26/2017  . Sensation of pressure in bladder area 01/31/2017  . 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 Neurogenic bladder 12/27/2016  . 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/27/2016  . Myelopathy ([place]) 12/21/2016  . Surgery, elective   . Diabetes mellitus type 2 in nonobese (HCC)   . History of lumbar fusion   . History of fusion of cervical spine   . Neuropathic pain   . Post-operative pain   . 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 Paraparesis ([place]) 12/15/2016  . Pedal edema 12/14/2016  . Type 2 diabetes mellitus without complication, without long-term current use of insulin ([place]) 11/02/2016  . Hyperlipidemia 11/02/2016  . Essential hypertension 11/02/2016  .  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    Past Surgical History:  Procedure Laterality Date  . ABDOMINAL EXPOSURE N/A 07/26/2017   Procedure: ABDOMINAL EXPOSURE;  Surgeon: Angelia Mould, MD;  Location: [facility];  Service: Vascular;  Laterality: N/A;  . ANTERIOR CERVICAL DECOMP/DISCECTOMY FUSION  2001  . ANTERIOR LUMBAR FUSION N/A 07/26/2017   Procedure: Lumbar five-Sacral one Anterior lumbar interbody fusion with Dr. Deitra Mayo for approach;  Surgeon: Ditty,  Kevan Ny, MD;  Location: [facility];  Service: Neurosurgery;  Laterality: N/A;  . APPLICATION OF ROBOTIC ASSISTANCE FOR SPINAL PROCEDURE  12/19/2016   Procedure: APPLICATION OF ROBOTIC ASSISTANCE FOR SPINAL PROCEDURE;  Surgeon: Kevan Ny Ditty, MD;  Location: [facility];  Service: Neurosurgery;;  . APPLICATION OF ROBOTIC ASSISTANCE FOR SPINAL PROCEDURE N/A 07/26/2017   Procedure: APPLICATION OF ROBOTIC ASSISTANCE FOR SPINAL PROCEDURE;  Surgeon: Ditty, Kevan Ny, MD;  Location: [facility];  Service: Neurosurgery;  Laterality: N/A;  . BACK SURGERY    . BLADDER SUSPENSION  1991   tack  . INGUINAL HERNIA REPAIR Right 1991  . LUMBAR FUSION  2016; 2017; 07/26/2017   L4-5; L2-3; L5-S1  . [place]  . TUMOR EXCISION     WERTHIN'S TUMORS BOTH SIDES OF NECK  . VAGINAL HYSTERECTOMY  1979    OB History    No data available       Home Medications    Prior to Admission medications   Medication Sig Start Date End Date Taking? Authorizing Provider  atorvastatin (LIPITOR) 20 MG tablet Take 1 tablet (20 mg total) by mouth every evening. 08/19/17   Angiulli, Lavon Paganini, PA-C  Biotin 5000 MCG CAPS Take 5,000 mcg daily by mouth.    [provider]  bisacodyl (DULCOLAX) 5 MG EC tablet Take 1 tablet (5 mg total) by mouth daily as needed for moderate constipation. 08/19/17   Angiulli, Lavon Paganini, PA-C  celecoxib (CELEBREX) 200 MG capsule Take 1 capsule (200 mg total) by mouth every 12 (twelve) hours. 08/19/17   Angiulli, Lavon Paganini, PA-C  cholecalciferol (VITAMIN D) 1000 units tablet Take 2 tablets (2,000 Units total) by mouth daily after lunch. 08/19/17   Angiulli, Lavon Paganini, PA-C  Coenzyme Q10 (COQ10) 100 MG CAPS Take 100 mg daily by mouth.     [provider]  esomeprazole (NEXIUM 24HR) 20 MG capsule Take 1 capsule (20 mg total) by mouth daily at 12 noon. 08/19/17   Angiulli, Lavon Paganini, PA-C  gabapentin (NEURONTIN) 300 MG capsule Take 1 capsule (300 mg total) by mouth 3  (three) times daily. 08/19/17   Angiulli, Lavon Paganini, PA-C  Garlic 956 MG CAPS Take 500 mg 2 (two) times daily by mouth.    [provider]  HYDROcodone-acetaminophen (NORCO/VICODIN) 5-325 MG tablet Take 1-2 tablets by mouth every 6 (six) hours as needed for moderate pain. 08/19/17   Angiulli, Lavon Paganini, PA-C  lisinopril (PRINIVIL,ZESTRIL) 5 MG tablet Take 1 tablet (5 mg total) by mouth daily. 08/19/17   Angiulli, Lavon Paganini, PA-C  metFORMIN (GLUCOPHAGE) 500 MG tablet Take 1 tablet (500 mg total) by mouth 2 (two) times daily with a meal. 08/19/17   Angiulli, Lavon Paganini, PA-C  polyethylene glycol (MIRALAX / GLYCOLAX) packet Take 17 g by mouth daily. 08/19/17   Angiulli, Lavon Paganini, PA-C  senna-docusate (SENOKOT-S) 8.6-50 MG tablet Take 2 tablets by mouth 2 (two) times daily. 01/04/17   Angiulli, Lavon Paganini, PA-C  tamsulosin [facility])  0.4 MG CAPS capsule Take 1 capsule (0.4 mg total) by mouth daily after supper. 08/19/17   Angiulli, Lavon Paganini, PA-C  tiZANidine (ZANAFLEX) 2 MG tablet Take 1 tablet (2 mg total) by mouth 3 (three) times daily. 08/19/17   Angiulli, Lavon Paganini, PA-C    Family History Family History  Problem Relation Age of Onset  . Diabetes Mother   . Dementia Mother   . Cirrhosis Mother        Non alcoholic  . COPD Father   . Diabetes Brother     Social History Social History   Tobacco Use  . Smoking status: Former Smoker    Packs/day: 1.50    Years: 37.00    Pack years: 55.50    Types: Cigarettes    Last attempt to quit: 05/13/2006    Years since quitting: 11.3  . Smokeless tobacco: Never Used  Substance Use Topics  . Alcohol use: Yes    Comment: 07/26/2017 "glass of wine once/wk if not on RX"  . Drug use: No     Allergies   Shellfish allergy; Oxycodone; and Penicillins   Review of Systems Review of Systems  Constitutional:       Per HPI, otherwise negative  HENT:       Per HPI, otherwise negative  Respiratory:       Per HPI, otherwise negative  Cardiovascular:        Per HPI, otherwise negative  Gastrointestinal: Positive for abdominal pain, diarrhea, nausea and vomiting.  Endocrine:       Negative aside from HPI  Genitourinary:       Neg aside from HPI   Musculoskeletal:       Per HPI, otherwise negative  Skin: Negative.   Neurological: Positive for weakness. Negative for syncope.     Physical Exam Updated Vital Signs BP 122/65 (BP Location: Left Arm)   Pulse 99   Temp 100 F (37.8 C) (Oral)   Resp 16   Ht 5\' 8"  (5.784 m)   Wt 72.6 kg (160 lb)   SpO2 96%   BMI 24.33 kg/m   Physical Exam  Constitutional: She is oriented to person, place, and time. She appears well-developed and well-nourished. No distress.  HENT:  Head: Normocephalic and atraumatic.  Eyes: Conjunctivae and EOM are normal.  Cardiovascular: Normal rate and regular rhythm.  Pulmonary/Chest: Effort normal and breath sounds normal. No stridor. No respiratory distress.  Abdominal: She exhibits no distension. There is tenderness in the right lower quadrant, suprapubic area and left lower quadrant.  Musculoskeletal: She exhibits no edema.       Legs: Neurological: She is alert and oriented to person, place, and time. She displays atrophy and tremor. No cranial nerve deficit.  Patient with atrophy throughout, and in the lower extremities, difficulty with hip flexion bilaterally, worse on the right. Right leg has a slight tremor  Skin: Skin is warm and dry.  Psychiatric: She has a normal mood and affect.  Nursing note and vitals reviewed.    ED Treatments / Results  Labs (all labs ordered are listed, but only abnormal results are displayed) Labs Reviewed  COMPREHENSIVE METABOLIC PANEL - Abnormal; Notable for the following components:      Result Value   Sodium 134 (*)    Glucose, Bld 184 (*)    Calcium 8.8 (*)    Albumin 3.1 (*)    ALT 13 (*)    Alkaline Phosphatase 144 (*)    All other components within  normal limits  CBC WITH DIFFERENTIAL/PLATELET - Abnormal;  Notable for the following components:   RBC 3.80 (*)    Hemoglobin 10.6 (*)    HCT 32.3 (*)    Lymphs Abs 0.4 (*)    All other components within normal limits  LIPASE, BLOOD  URINALYSIS, ROUTINE W REFLEX MICROSCOPIC     Radiology Ct Abdomen Pelvis W Contrast  Result Date: 08/29/2017 CLINICAL DATA:  Abdominal pain, diverticulitis suspected. Diarrhea with nausea and vomiting EXAM: CT ABDOMEN AND PELVIS WITH CONTRAST TECHNIQUE: Multidetector CT imaging of the abdomen and pelvis was performed using the standard protocol following bolus administration of intravenous contrast. CONTRAST:  76mL ISOVUE-300 IOPAMIDOL (ISOVUE-300) INJECTION 61% COMPARISON:  Lumbar spine fluoroscopy 07/26/2017. Abdominal CT 07/08/2006 FINDINGS: Lower chest:  No contributory findings. Hepatobiliary: No focal liver abnormality.No evidence of biliary obstruction or stone. Pancreas: Unremarkable. Spleen: Unremarkable. Adrenals/Urinary Tract: Negative adrenals. Mild prominence of urothelial enhancement, but symmetric. Physiologic distension of the bladder. Stomach/Bowel: The rectum is distended by stool, measuring 8 cm. No superimposed inflammation is noted. Stool seen throughout the nondilated colon. Negative for small bowel obstruction. No appendicitis. Vascular/Lymphatic: No acute vascular abnormality. Extensive atherosclerotic plaque. No mass or adenopathy. Reproductive:Hysterectomy.  Negative adnexae. Other: No ascites or pneumoperitoneum. Musculoskeletal: Recent L5-S1 ALIF and posterior fusion extending to the pelvis. Associated hardware is in stable position. The left pelvic screw contacts the left iliac vein without hematoma or swelling. L3-4 and L4-5 with solid arthrodesis. Posterior fusion hardware continues to L4. Pedicle screws at L2 and L3 have been removed. There is an L3 body fracture extending horizontally along the superior endplate and vertically along the posterior body. There may be continuation through  posterior-lateral arthrodesis bilaterally. Vacuum phenomenon in the body fracture. Posterior displacement that narrows the canal, although there has been previous decompressive laminectomy at this level. These changes distort the narrow the spinal canal at L3-4, but there has been decompressive laminectomy at this level. Biforaminal L3 impingement is severe. Partially visualized thoracic fixation hardware and posterior decompression. These results were called by telephone at the time of interpretation on 08/29/2017 at 11:25 am to Dr. Carmin Muskrat , who verbally acknowledged these results. IMPRESSION: 1. Over distended rectum by stool. 2. L3 body fracture with compression and posterior displacement. The fracture likely continues into bilateral posterior elements which have undergone remote arthrodesis. L3-4 biforaminal compression that is new and severe. 3. Recent L5-S1 ALIF and posterior fusion extending to the pelvis. Associated hardware is stable. Electronically Signed   By: Monte Fantasia M.D.   On: 08/29/2017 11:27    Procedures Procedures (including critical care time)  Medications Ordered in ED Medications  0.9 %  sodium chloride infusion (not administered)  ondansetron (ZOFRAN) injection 4 mg (not administered)     Initial Impression / Assessment and Plan / ED Course  I have reviewed the triage vital signs and the nursing notes.  Pertinent labs & imaging results that were available during my care of the patient were reviewed by me and considered in my medical decision making (see chart for details).  After the initial evaluation I reviewed the patient's chart including documentation from recent hospitalization for surgery, procedure as below Procedure Performed: L5-S1 ALIF; L5-S1 posterolateral fusion with L4 and pelvic fixation    I discussed the patient's CT imaging with our radiologist. There is evidence for both fecal impaction with distended rectum, 8 cm, as well as fracture of  L3 vertebral body with retropulsion. [place] discussed the patient's abnormalities with her neurosurgeon  Given the patient's ongoing pain, right lower extremity concerns, and new fracture, she will be admitted. With concurrent abdominal pain, and her multiple medical issues, patient will be admitted to the hospitalist service.  This elderly female 1 month status post lumbar spine fusion now presents with pain in lower abdomen, change in bowel habits, and ongoing difficulty with ambulation, and tremor in her right lower extremity. The patient is awake alert, afebrile, there is no evidence for new infection. However, there is evidence for new vertebral fracture, and concern for compression of multiple nerve roots. In addition, the patient has ongoing abdominal pain, some evidence for stool impaction. Patient received enema in the emergency department, and after discussion with our neurosurgical team, was admitted to the hospitalist team for further evaluation, monitoring, management with neurosurgical consultation.  Final Clinical Impressions(s) / ED Diagnoses  Abdominal pain Weakness, right lower extremity Lumbar vertebrae fracture, initial encounter   Carmin Muskrat, MD 08/29/17 1231

## 2017-08-29 NOTE — Consult Note (Signed)
Medical Consultation   Megan [name]  NLZ:[id number]  DOB: June 07, 1951  DOA: 08/29/2017  PCP: Pleas Koch, NP   Outpatient Specialists:    Requesting physician: Dr. Cyndy Freeze (Neuro-surgery)  Reason for consultation: Nausea, vomiting and abdominal pain.  History of Present Illness: Megan [name] is an 66 y.o. female with past medical history significant for Hypertension, diabetes mellitus and recent spinal procedure involving L5-S1 (L5-S1 ALIF). Post op, patient was sent to rehab and discharged home eventually. Patient presents with 3 day history of nausea, vomiting, abdominal pain and frequent bowel movement. CT abdomen and pelvis revealed distended rectum by stool. CT abdomen and pelvis also revealed L3 body fracture with compression and posterior displacement, but patient denied back pain. Patient reported that she had not been able to void at home and her husband had been catheterizing her, as well as decreased ability to use lower extremities. Based on above, the Neuro Surgery team has admitted the patient, and consulted Internal Medicine to assist with medical management. ER provider has already ordered an enema for patient. BP is reasonably controlled, and the blood sugar is 184 (random). No fever or chills, no neck pain, no headache, no fever or chills.  Review of Systems:  ROS As per HPI otherwise 10 point review of systems negative.    Past Medical History: Past Medical History:  Diagnosis Date  . Arthritis    "hands, back" (07/26/2017)  . Dyspnea    W/ PHYS CONDITION   . GERD (gastroesophageal reflux disease)   . History of bronchitis    "not since I quit smoking" (07/26/2017)  . History of kidney stones   . History of shingles   . Hyperlipidemia    takes Fish Oil daily  . Hypertension   . Neuromuscular disorder (HCC)    tingling toes  . 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 Pneumonia 2009  . PONV (postoperative nausea and  vomiting)   . Restless leg   . Type 2 diabetes mellitus ([place])   . Uterine cancer ([place]) 1979   S/P hysterectomy  . Weakness    numbness and tingling in both feet r/t back    Past Surgical History: Past Surgical History:  Procedure Laterality Date  . ABDOMINAL EXPOSURE N/A 07/26/2017   Procedure: ABDOMINAL EXPOSURE;  Surgeon: Angelia Mould, MD;  Location: [facility];  Service: Vascular;  Laterality: N/A;  . ANTERIOR CERVICAL DECOMP/DISCECTOMY FUSION  2001  . ANTERIOR LUMBAR FUSION N/A 07/26/2017   Procedure: Lumbar five-Sacral one Anterior lumbar interbody fusion with Dr. Deitra Mayo for approach;  Surgeon: Ditty, Kevan Ny, MD;  Location: [facility];  Service: Neurosurgery;  Laterality: N/A;  . APPLICATION OF ROBOTIC ASSISTANCE FOR SPINAL PROCEDURE  12/19/2016   Procedure: APPLICATION OF ROBOTIC ASSISTANCE FOR SPINAL PROCEDURE;  Surgeon: Kevan Ny Ditty, MD;  Location: [facility];  Service: Neurosurgery;;  . APPLICATION OF ROBOTIC ASSISTANCE FOR SPINAL PROCEDURE N/A 07/26/2017   Procedure: APPLICATION OF ROBOTIC ASSISTANCE FOR SPINAL PROCEDURE;  Surgeon: Ditty, Kevan Ny, MD;  Location: [facility];  Service: Neurosurgery;  Laterality: N/A;  . BACK SURGERY    . BLADDER SUSPENSION  1991   tack  . INGUINAL HERNIA REPAIR Right 1991  . LUMBAR FUSION  2016; 2017; 07/26/2017   L4-5; L2-3; L5-S1  . [place]  . TUMOR EXCISION     WERTHIN'S TUMORS BOTH SIDES OF NECK  .  VAGINAL HYSTERECTOMY  1979     Allergies:   Allergies  Allergen Reactions  . Shellfish Allergy Anaphylaxis, Swelling and Other (See Comments)    Tongue swells  . Oxycodone Nausea Only  . Penicillins Rash and Other (See Comments)    Has patient had a PCN reaction causing immediate rash, facial/tongue/throat swelling, SOB or lightheadedness with hypotension: Yes Has patient had a PCN reaction causing severe rash involving mucus membranes or skin necrosis: No Has patient had a PCN  reaction that required hospitalization No Has patient had a PCN reaction occurring within the last 10 years: No If all of the above answers are "NO", then may proceed with Cephalosporin use.     Social History:  reports that she quit smoking about 11 years ago. Her smoking use included cigarettes. She has a 55.50 pack-year smoking history. she has never used smokeless tobacco. She reports that she drinks alcohol. She reports that she does not use drugs.   Family History: Family History  Problem Relation Age of Onset  . Diabetes Mother   . Dementia Mother   . Cirrhosis Mother        Non alcoholic  . COPD Father   . Diabetes Brother      Physical Exam: Vitals:   08/29/17 1530 08/29/17 1545 08/29/17 1615 08/29/17 1653  BP: (!) 141/62 (!) 147/73 (!) 149/70 (!) 147/68  Pulse: 95 (!) 113 95 97  Resp:    17  Temp:    99.8 F (37.7 C)  TempSrc:    Oral  SpO2: 96% 96% 96% 98%  Weight:      Height:        Constitutional: Appearance,  Alert and awake, oriented x3, not in any acute distress. Eyes: PERLA, EOMI. ENMT: external ears and nose appear normal,   Neck: neck is supple. No JVD.   CVS: S1-S2. Respiratory:  clear to auscultation. Abdomen: soft nontender, nondistended, normal bowel sounds, no hepatosplenomegaly, no hernias  Musculoskeletal: no cyanosis, clubbing or edema noted bilaterally Neuro: Awake and alert. Limited movement of lower extremities.   Data reviewed:  I have personally reviewed following labs and imaging studies Labs:  CBC: Recent Labs  Lab 08/29/17 0848  WBC 8.1  NEUTROABS 6.8  HGB 10.6*  HCT 32.3*  MCV 85.0  PLT 725    Basic Metabolic Panel: Recent Labs  Lab 08/29/17 0848  NA 134*  K 3.8  CL 101  CO2 22  GLUCOSE 184*  BUN 10  CREATININE 0.66  CALCIUM 8.8*   GFR Estimated Creatinine Clearance: 71.7 mL/min (by C-G formula based on SCr of 0.66 mg/dL). Liver Function Tests: Recent Labs  Lab 08/29/17 0848  AST 16  ALT 13*  ALKPHOS  144*  BILITOT 0.9  PROT 6.5  ALBUMIN 3.1*   Recent Labs  Lab 08/29/17 0848  LIPASE 30   No results for input(s): AMMONIA in the last 168 hours. Coagulation profile No results for input(s): INR, PROTIME in the last 168 hours.  Cardiac Enzymes: No results for input(s): CKTOTAL, CKMB, CKMBINDEX, TROPONINI in the last 168 hours. BNP: Invalid input(s): POCBNP CBG: No results for input(s): GLUCAP in the last 168 hours. D-Dimer No results for input(s): DDIMER in the last 72 hours. Hgb A1c No results for input(s): HGBA1C in the last 72 hours. Lipid Profile No results for input(s): CHOL, HDL, LDLCALC, TRIG, CHOLHDL, LDLDIRECT in the last 72 hours. Thyroid function studies No results for input(s): TSH, T4TOTAL, T3FREE, THYROIDAB in the last 72 hours.  Invalid input(s): FREET3 Anemia work up No results for input(s): VITAMINB12, FOLATE, FERRITIN, TIBC, IRON, RETICCTPCT in the last 72 hours. Urinalysis    Component Value Date/Time   COLORURINE YELLOW 08/29/2017 1535   APPEARANCEUR CLOUDY (A) 08/29/2017 1535   LABSPEC <1.005 (L) 08/29/2017 1535   PHURINE 6.5 08/29/2017 1535   GLUCOSEU NEGATIVE 08/29/2017 1535   HGBUR SMALL (A) 08/29/2017 1535   BILIRUBINUR NEGATIVE 08/29/2017 1535   BILIRUBINUR 1+ 01/31/2017 0841   KETONESUR NEGATIVE 08/29/2017 1535   PROTEINUR NEGATIVE 08/29/2017 1535   UROBILINOGEN 0.2 01/31/2017 0841   UROBILINOGEN 0.2 06/13/2015 0936   NITRITE NEGATIVE 08/29/2017 1535   LEUKOCYTESUR LARGE (A) 08/29/2017 1535     Sepsis Labs Invalid input(s): PROCALCITONIN,  WBC,  LACTICIDVEN Microbiology No results found for this or any previous visit (from the past 240 hour(s)).  Inpatient Medications:   Scheduled Meds: . atorvastatin  20 mg Oral QPM  . bethanechol  5 mg Oral TID  . celecoxib  200 mg Oral Q12H  . cholecalciferol  2,000 Units Oral QPC lunch  . gabapentin  300 mg Oral TID  . [START ON 08/30/2017] lisinopril  5 mg Oral Daily  . metFORMIN  500 mg  Oral BID WC  . milk and molasses  1 enema Rectal Once  . pantoprazole  40 mg Oral Daily  . senna  1 tablet Oral BID  . sodium chloride flush  3 mL Intravenous Q12H  . tamsulosin  0.4 mg Oral QPC supper  . tiZANidine  2 mg Oral TID   Continuous Infusions: . sodium chloride Stopped (08/29/17 1040)  . sodium chloride    . sodium chloride       Radiological Exams on Admission: Ct Abdomen Pelvis W Contrast  Result Date: 08/29/2017 CLINICAL DATA:  Abdominal pain, diverticulitis suspected. Diarrhea with nausea and vomiting EXAM: CT ABDOMEN AND PELVIS WITH CONTRAST TECHNIQUE: Multidetector CT imaging of the abdomen and pelvis was performed using the standard protocol following bolus administration of intravenous contrast. CONTRAST:  42mL ISOVUE-300 IOPAMIDOL (ISOVUE-300) INJECTION 61% COMPARISON:  Lumbar spine fluoroscopy 07/26/2017. Abdominal CT 07/08/2006 FINDINGS: Lower chest:  No contributory findings. Hepatobiliary: No focal liver abnormality.No evidence of biliary obstruction or stone. Pancreas: Unremarkable. Spleen: Unremarkable. Adrenals/Urinary Tract: Negative adrenals. Mild prominence of urothelial enhancement, but symmetric. Physiologic distension of the bladder. Stomach/Bowel: The rectum is distended by stool, measuring 8 cm. No superimposed inflammation is noted. Stool seen throughout the nondilated colon. Negative for small bowel obstruction. No appendicitis. Vascular/Lymphatic: No acute vascular abnormality. Extensive atherosclerotic plaque. No mass or adenopathy. Reproductive:Hysterectomy.  Negative adnexae. Other: No ascites or pneumoperitoneum. Musculoskeletal: Recent L5-S1 ALIF and posterior fusion extending to the pelvis. Associated hardware is in stable position. The left pelvic screw contacts the left iliac vein without hematoma or swelling. L3-4 and L4-5 with solid arthrodesis. Posterior fusion hardware continues to L4. Pedicle screws at L2 and L3 have been removed. There is an L3  body fracture extending horizontally along the superior endplate and vertically along the posterior body. There may be continuation through posterior-lateral arthrodesis bilaterally. Vacuum phenomenon in the body fracture. Posterior displacement that narrows the canal, although there has been previous decompressive laminectomy at this level. These changes distort the narrow the spinal canal at L3-4, but there has been decompressive laminectomy at this level. Biforaminal L3 impingement is severe. Partially visualized thoracic fixation hardware and posterior decompression. These results were called by telephone at the time of interpretation on 08/29/2017 at 11:25 am to Dr. Carmin Muskrat ,  who verbally acknowledged these results. IMPRESSION: 1. Over distended rectum by stool. 2. L3 body fracture with compression and posterior displacement. The fracture likely continues into bilateral posterior elements which have undergone remote arthrodesis. L3-4 biforaminal compression that is new and severe. 3. Recent L5-S1 ALIF and posterior fusion extending to the pelvis. Associated hardware is stable. Electronically Signed   By: Monte Fantasia M.D.   On: 08/29/2017 11:27    Impression/Recommendations Active Problems:   Nausea Vomiting Fecal frequency Severe constipation/obstipation History DM Hypertension Recent Spinal surgery  - Agree with enema - Continue laxative - Optimize Blood sugar - Hospitalist team will follow with you. - Optimize BP control.  Thank you for this consultation.  [facility] hospitalist team will follow the patient with you.   Time Spent: 60 Minutes.  Dana Allan, MD  [facility] Pager #: [phone] 7PM-7AM contact night coverage as above

## 2017-08-29 NOTE — ED Triage Notes (Addendum)
Patient arrived to ED from home via [facility]. PTAR reports:  Patient c/o nausea, vomiting, diarrhea since yesterday. Had back surgery approx 2 weeks ago. (Back sx 07/27/17) VSS. BP 144/68, Pulse 105, CBG 208. Hx DM

## 2017-08-29 NOTE — ED Notes (Signed)
Patient transported to CT 

## 2017-08-29 NOTE — ED Notes (Signed)
Patient declined enema at this time. Patient reports nausea. Spouse and daughter present at bedside.

## 2017-08-29 NOTE — H&P (Signed)
Chief Complaint   Chief Complaint  Patient presents with  . Nausea  . Emesis    HPI   HPI  Megan [name] is a 66 y.o. female who presented to ER due to nausea, vomiting and abdominal pain x3 days. Denies diarrhea, but is having multiple BM per day which is uncharacteristic of normal habit. Associated with LLQ abd pain. No blood in stool. CT abd/pelvis was obtained for diverticulitis r/o & shows large amount of stool in rectum with distention & L3 vertebral body fracture with posterior displacement. NS was contacted due to L3 fracture.  She is s/p ALIF and posterolateral fusion by Dr Marland Kitchen Ditty 07/26/2017. Post operatively, she went to CIR for several weeks. She has been home for roughly 1 week. In last couple days of rehab, she reports inability to urinate requiring husband to I/O cath q 6-8 hours. She also started to have RLE tremors and her legs occasionally draw up to chest involuntarily. She has not been evaluated by [facility] for this as of yet. Her first post op visit is scheduled for this upcoming Thursday. She denies any trauma/falls. She reports RLE weakness & tremors have been worsening.    Patient Active Problem List   Diagnosis Date Noted  . Nausea 08/29/2017  . Constipation due to pain medication   . Muscle spasms of both lower extremities   . Type 2 diabetes mellitus with peripheral neuropathy (HCC)   . Hypoalbuminemia due to protein-calorie malnutrition ([place])   . Radiculopathy 07/28/2017  . Fusion of spine, lumbosacral region   . Acute blood loss anemia   . Benign essential HTN   . Diabetes mellitus type 2 in obese ([place])   . Lumbosacral spondylosis with radiculopathy 07/26/2017  . Sensation of pressure in bladder area 01/31/2017  . Paraplegia, incomplete ([place]) 01/07/2017  . Neurogenic bladder 12/27/2016  . 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/27/2016  . Myelopathy ([place]) 12/21/2016  . Surgery, elective   . Diabetes mellitus type 2 in nonobese (HCC)   . History of lumbar fusion    . History of fusion of cervical spine   . Neuropathic pain   . Post-operative pain   . 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 Paraparesis ([place]) 12/15/2016  . Pedal edema 12/14/2016  . Type 2 diabetes mellitus without complication, without long-term current use of insulin ([place]) 11/02/2016  . Hyperlipidemia 11/02/2016  . Essential hypertension 11/02/2016  . 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    PMH: Past Medical History:  Diagnosis Date  . Arthritis    "hands, back" (07/26/2017)  . Dyspnea    W/ PHYS CONDITION   . GERD (gastroesophageal reflux disease)   . History of bronchitis    "not since I quit smoking" (07/26/2017)  . History of kidney stones   . History of shingles   . Hyperlipidemia    takes Fish Oil daily  . Hypertension   . Neuromuscular disorder (HCC)    tingling toes  . 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[place]) 12/15/2016  . Pneumonia 2009  . PONV (postoperative nausea and vomiting)   . Restless leg   . Type 2 diabetes mellitus ([place])   . Uterine cancer ([place]) 1979   S/P hysterectomy  . Weakness    numbness and tingling in both feet r/t back    PSH: Past Surgical History:  Procedure Laterality Date  . ABDOMINAL EXPOSURE N/A 07/26/2017   Procedure: ABDOMINAL EXPOSURE;  Surgeon: Angelia Mould,  MD;  Location: [place];  Service: Vascular;  Laterality: N/A;  . ANTERIOR CERVICAL DECOMP/DISCECTOMY FUSION  2001  . ANTERIOR LUMBAR FUSION N/A 07/26/2017   Procedure: Lumbar five-Sacral one Anterior lumbar interbody fusion with Dr. Deitra Mayo for approach;  Surgeon: Ditty, Kevan Ny, MD;  Location: [facility];  Service: Neurosurgery;  Laterality: N/A;  . APPLICATION OF ROBOTIC ASSISTANCE FOR SPINAL PROCEDURE  12/19/2016   Procedure: APPLICATION OF ROBOTIC ASSISTANCE FOR SPINAL PROCEDURE;  Surgeon: Kevan Ny Ditty, MD;  Location: [place];  Service: Neurosurgery;;   . APPLICATION OF ROBOTIC ASSISTANCE FOR SPINAL PROCEDURE N/A 07/26/2017   Procedure: APPLICATION OF ROBOTIC ASSISTANCE FOR SPINAL PROCEDURE;  Surgeon: Ditty, Kevan Ny, MD;  Location: [facility];  Service: Neurosurgery;  Laterality: N/A;  . BACK SURGERY    . BLADDER SUSPENSION  1991   tack  . INGUINAL HERNIA REPAIR Right 1991  . LUMBAR FUSION  2016; 2017; 07/26/2017   L4-5; L2-3; L5-S1  . [place]  . TUMOR EXCISION     WERTHIN'S TUMORS BOTH SIDES OF NECK  . VAGINAL HYSTERECTOMY  1979     (Not in a hospital admission)  SH: Social History   Tobacco Use  . Smoking status: Former Smoker    Packs/day: 1.50    Years: 37.00    Pack years: 55.50    Types: Cigarettes    Last attempt to quit: 05/13/2006    Years since quitting: 11.3  . Smokeless tobacco: Never Used  Substance Use Topics  . Alcohol use: Yes    Comment: 07/26/2017 "glass of wine once/wk if not on RX"  . Drug use: No    MEDS: Prior to Admission medications   Medication Sig Start Date End Date Taking? Authorizing Provider  atorvastatin (LIPITOR) 20 MG tablet Take 1 tablet (20 mg total) by mouth every evening. 08/19/17  Yes Angiulli, Lavon Paganini, PA-C  bethanechol (URECHOLINE) 5 MG tablet Take 5 mg by mouth 3 (three) times daily. 08/19/17  Yes [provider]  Biotin 5000 MCG CAPS Take 5,000 mcg by mouth every morning.    Yes [provider]  celecoxib (CELEBREX) 200 MG capsule Take 1 capsule (200 mg total) by mouth every 12 (twelve) hours. 08/19/17  Yes Angiulli, Lavon Paganini, PA-C  cholecalciferol (VITAMIN D) 1000 units tablet Take 2 tablets (2,000 Units total) by mouth daily after lunch. Patient taking differently: Take 1,000 Units by mouth daily after lunch.  08/19/17  Yes Angiulli, Lavon Paganini, PA-C  Coenzyme Q10 (COQ10) 100 MG CAPS Take 100 mg daily by mouth.    Yes [provider]  esomeprazole (NEXIUM 24HR) 20 MG capsule Take 1 capsule (20 mg total) by mouth daily at 12  noon. 08/19/17  Yes Angiulli, Lavon Paganini, PA-C  gabapentin (NEURONTIN) 300 MG capsule Take 1 capsule (300 mg total) by mouth 3 (three) times daily. 08/19/17  Yes Angiulli, Lavon Paganini, PA-C  Garlic 973 MG CAPS Take 500 mg 2 (two) times daily by mouth.   Yes [provider]  HYDROcodone-acetaminophen (NORCO/VICODIN) 5-325 MG tablet Take 1-2 tablets by mouth every 6 (six) hours as needed for moderate pain. Patient taking differently: Take 1 tablet by mouth every 6 (six) hours as needed for moderate pain.  08/19/17  Yes Angiulli, Lavon Paganini, PA-C  lisinopril (PRINIVIL,ZESTRIL) 5 MG tablet Take 1 tablet (5 mg total) by mouth daily. 08/19/17  Yes Angiulli, Lavon Paganini, PA-C  metFORMIN (GLUCOPHAGE) 500 MG tablet Take 1 tablet (500 mg total) by  mouth 2 (two) times daily with a meal. 08/19/17  Yes Angiulli, Lavon Paganini, PA-C  senna-docusate (SENOKOT-S) 8.6-50 MG tablet Take 2 tablets by mouth 2 (two) times daily. Patient taking differently: Take 1-2 tablets by mouth daily as needed for mild constipation.  01/04/17  Yes Angiulli, Lavon Paganini, PA-C  tamsulosin (FLOMAX) 0.4 MG CAPS capsule Take 1 capsule (0.4 mg total) by mouth daily after supper. 08/19/17  Yes Angiulli, Lavon Paganini, PA-C  tiZANidine (ZANAFLEX) 2 MG tablet Take 1 tablet (2 mg total) by mouth 3 (three) times daily. 08/19/17  Yes Angiulli, Lavon Paganini, PA-C  bisacodyl (DULCOLAX) 5 MG EC tablet Take 1 tablet (5 mg total) by mouth daily as needed for moderate constipation. Patient not taking: Reported on 08/29/2017 08/19/17   Angiulli, Lavon Paganini, PA-C  polyethylene glycol [facility] / GLYCOLAX) packet Take 17 g by mouth daily. Patient taking differently: Take 17 g by mouth daily as needed for mild constipation.  08/19/17   Angiulli, Lavon Paganini, PA-C    ALLERGY: Allergies  Allergen Reactions  . Shellfish Allergy Anaphylaxis, Swelling and Other (See Comments)    Tongue swells  . Oxycodone Nausea Only  . Penicillins Rash and Other (See Comments)    Has patient had a PCN  reaction causing immediate rash, facial/tongue/throat swelling, SOB or lightheadedness with hypotension: Yes Has patient had a PCN reaction causing severe rash involving mucus membranes or skin necrosis: No Has patient had a PCN reaction that required hospitalization No Has patient had a PCN reaction occurring within the last 10 years: No If all of the above answers are "NO", then may proceed with Cephalosporin use.    Social History   Tobacco Use  . Smoking status: Former Smoker    Packs/day: 1.50    Years: 37.00    Pack years: 55.50    Types: Cigarettes    Last attempt to quit: 05/13/2006    Years since quitting: 11.3  . Smokeless tobacco: Never Used  Substance Use Topics  . Alcohol use: Yes    Comment: 07/26/2017 "glass of wine once/wk if not on RX"     Family History  Problem Relation Age of Onset  . Diabetes Mother   . Dementia Mother   . Cirrhosis Mother        Non alcoholic  . COPD Father   . Diabetes Brother      ROS   Review of Systems  Constitutional: Positive for malaise/fatigue. Negative for chills and fever.  HENT: Negative.   Eyes: Negative for blurred vision, double vision and photophobia.  Respiratory: Negative.   Cardiovascular: Negative.   Gastrointestinal: Positive for abdominal pain, nausea and vomiting. Negative for blood in stool, constipation, diarrhea and melena.  Genitourinary:       Unable to assess, patient has been requiring catheter due to urinary retention  Musculoskeletal: Positive for back pain, falls and myalgias. Negative for joint pain and neck pain.  Skin: Negative.   Neurological: Positive for tingling (RLE), tremors (RLE), sensory change (RLE), focal weakness (RLE) and weakness. Negative for dizziness, speech change, seizures, loss of consciousness and headaches.    Exam   Vitals:   08/29/17 1430 08/29/17 1445  BP: (!) 158/67 (!) 142/70  Pulse: 100 95  Resp:    Temp:    SpO2: 99% 98%   General appearance: Elderly female,  laying on left side, right leg with intermittent tremors Eyes: PERRL, Fundoscopic: normal Cardiovascular: Regular rate and rhythm without murmurs, rubs, gallops. No edema or variciosities.  Distal pulses normal. Pulmonary: Clear to auscultation Musculoskeletal:     Muscle tone upper extremities: Normal    Muscle tone lower extremities: Normal    Motor exam: Upper Extremities Deltoid Bicep Tricep Grip  Right 5/5 5/5 5/5 5/5  Left 5/5 5/5 5/5 5/5     LLE: 5/5 diffusely  RLE: tremulous. Flexed at knee. Cannot raise leg off table. Unable to straighten completely. Able to wiggle toes and move foot at ankle. Slight decrease in sensation through RLE.  Neurological Awake, alert, oriented Memory and concentration grossly intact Speech fluent, appropriate CNII: Visual fields normal CNIII/IV/VI: EOMI CNV: Facial sensation normal CNVII: Symmetric, normal strength CNVIII: Grossly normal CNIX: Normal palate movement CNXI: Trap and SCM strength normal CN XII: Tongue protrusion normal Sensation grossly intact to LT DTR: Normal with exception of 3+ patellar right and achilles.  Results - Imaging/Labs   Results for orders placed or performed during the hospital encounter of 08/29/17 (from the past 48 hour(s))  Comprehensive metabolic panel     Status: Abnormal   Collection Time: 08/29/17  8:48 AM  Result Value Ref Range   Sodium 134 (L) 135 - 145 mmol/L   Potassium 3.8 3.5 - 5.1 mmol/L   Chloride 101 101 - 111 mmol/L   CO2 22 22 - 32 mmol/L   Glucose, Bld 184 (H) 65 - 99 mg/dL   BUN 10 6 - 20 mg/dL   Creatinine, Ser 0.66 0.44 - 1.00 mg/dL   Calcium 8.8 (L) 8.9 - 10.3 mg/dL   Total Protein 6.5 6.5 - 8.1 g/dL   Albumin 3.1 (L) 3.5 - 5.0 g/dL   AST 16 15 - 41 U/L   ALT 13 (L) 14 - 54 U/L   Alkaline Phosphatase 144 (H) 38 - 126 U/L   Total Bilirubin 0.9 0.3 - 1.2 mg/dL   GFR calc non Af Amer >60 >60 mL/min   GFR calc Af Amer >60 >60 mL/min    Comment: (NOTE) The eGFR has been calculated  using the CKD EPI equation. This calculation has not been validated in all clinical situations. eGFR's persistently <60 mL/min signify possible Chronic Kidney Disease.    Anion gap 11 5 - 15  Lipase, blood     Status: None   Collection Time: 08/29/17  8:48 AM  Result Value Ref Range   Lipase 30 11 - 51 U/L  CBC WITH DIFFERENTIAL     Status: Abnormal   Collection Time: 08/29/17  8:48 AM  Result Value Ref Range   WBC 8.1 4.0 - 10.5 K/uL   RBC 3.80 (L) 3.87 - 5.11 MIL/uL   Hemoglobin 10.6 (L) 12.0 - 15.0 g/dL   HCT 32.3 (L) 36.0 - 46.0 %   MCV 85.0 78.0 - 100.0 fL   MCH 27.9 26.0 - 34.0 pg   MCHC 32.8 30.0 - 36.0 g/dL   RDW 14.1 11.5 - 15.5 %   Platelets 222 150 - 400 K/uL   Neutrophils Relative % 83 %   Neutro Abs 6.8 1.7 - 7.7 K/uL   Lymphocytes Relative 5 %   Lymphs Abs 0.4 (L) 0.7 - 4.0 K/uL   Monocytes Relative 12 %   Monocytes Absolute 1.0 0.1 - 1.0 K/uL   Eosinophils Relative 0 %   Eosinophils Absolute 0.0 0.0 - 0.7 K/uL   Basophils Relative 0 %   Basophils Absolute 0.0 0.0 - 0.1 K/uL    Ct Abdomen Pelvis W Contrast  Result Date: 08/29/2017 CLINICAL DATA:  Abdominal pain, diverticulitis  suspected. Diarrhea with nausea and vomiting EXAM: CT ABDOMEN AND PELVIS WITH CONTRAST TECHNIQUE: Multidetector CT imaging of the abdomen and pelvis was performed using the standard protocol following bolus administration of intravenous contrast. CONTRAST:  18m ISOVUE-300 IOPAMIDOL (ISOVUE-300) INJECTION 61% COMPARISON:  Lumbar spine fluoroscopy 07/26/2017. Abdominal CT 07/08/2006 FINDINGS: Lower chest:  No contributory findings. Hepatobiliary: No focal liver abnormality.No evidence of biliary obstruction or stone. Pancreas: Unremarkable. Spleen: Unremarkable. Adrenals/Urinary Tract: Negative adrenals. Mild prominence of urothelial enhancement, but symmetric. Physiologic distension of the bladder. Stomach/Bowel: The rectum is distended by stool, measuring 8 cm. No superimposed inflammation is  noted. Stool seen throughout the nondilated colon. Negative for small bowel obstruction. No appendicitis. Vascular/Lymphatic: No acute vascular abnormality. Extensive atherosclerotic plaque. No mass or adenopathy. Reproductive:Hysterectomy.  Negative adnexae. Other: No ascites or pneumoperitoneum. Musculoskeletal: Recent L5-S1 ALIF and posterior fusion extending to the pelvis. Associated hardware is in stable position. The left pelvic screw contacts the left iliac vein without hematoma or swelling. L3-4 and L4-5 with solid arthrodesis. Posterior fusion hardware continues to L4. Pedicle screws at L2 and L3 have been removed. There is an L3 body fracture extending horizontally along the superior endplate and vertically along the posterior body. There may be continuation through posterior-lateral arthrodesis bilaterally. Vacuum phenomenon in the body fracture. Posterior displacement that narrows the canal, although there has been previous decompressive laminectomy at this level. These changes distort the narrow the spinal canal at L3-4, but there has been decompressive laminectomy at this level. Biforaminal L3 impingement is severe. Partially visualized thoracic fixation hardware and posterior decompression. These results were called by telephone at the time of interpretation on 08/29/2017 at 11:25 am to Dr. RCarmin Muskrat, who verbally acknowledged these results. IMPRESSION: 1. Over distended rectum by stool. 2. L3 body fracture with compression and posterior displacement. The fracture likely continues into bilateral posterior elements which have undergone remote arthrodesis. L3-4 biforaminal compression that is new and severe. 3. Recent L5-S1 ALIF and posterior fusion extending to the pelvis. Associated hardware is stable. Electronically Signed   By: JMonte FantasiaM.D.   On: 08/29/2017 11:27   Impression/Plan   66y.o. female who presented to ER for acute onset nausea, vomiting abd abdominal pain. She is s/p  ALIF by Dr BMarland KitchenDitty. She has significant RLE deficits as above which all have occurred since being in rehab without any injury. She also has new bladder dysfunction requiring catheter. Case reviewed with attending BJD. Concern for this new fracture through L3 verterbral body without any injury. Pt admitted for MRI Lumbar spine w/wo contrast. Hospitalists on board for management of nausea/vomiting/abd pain/constipation. If MRI unremarkable, patient will be discharged tomorrow from NS standpoint.

## 2017-08-29 NOTE — ED Notes (Signed)
MD at bedside at this time.

## 2017-08-30 ENCOUNTER — Inpatient Hospital Stay (HOSPITAL_COMMUNITY): Payer: BLUE CROSS/BLUE SHIELD

## 2017-08-30 DIAGNOSIS — E118 Type 2 diabetes mellitus with unspecified complications: Secondary | ICD-10-CM

## 2017-08-30 DIAGNOSIS — K5900 Constipation, unspecified: Secondary | ICD-10-CM

## 2017-08-30 LAB — HEMOGLOBIN A1C
Hgb A1c MFr Bld: 6 % — ABNORMAL HIGH (ref 4.8–5.6)
Mean Plasma Glucose: 126 mg/dL

## 2017-08-30 LAB — GLUCOSE, CAPILLARY
GLUCOSE-CAPILLARY: 194 mg/dL — AB (ref 65–99)
GLUCOSE-CAPILLARY: 137 mg/dL — AB (ref 65–99)
GLUCOSE-CAPILLARY: 158 mg/dL — AB (ref 65–99)
Glucose-Capillary: 140 mg/dL — ABNORMAL HIGH (ref 65–99)
Glucose-Capillary: 157 mg/dL — ABNORMAL HIGH (ref 65–99)

## 2017-08-30 MED ORDER — GADOBENATE DIMEGLUMINE 529 MG/ML IV SOLN
15.0000 mL | Freq: Once | INTRAVENOUS | Status: AC | PRN
  Administered 2017-08-30: 15 mL via INTRAVENOUS

## 2017-08-30 NOTE — Care Management Note (Addendum)
Case Management Note  Patient Details  Name: OLUWATOSIN BRACY MRN: [id number] Date of Birth: Jul 06, 1951  Subjective/Objective:    Pt in with nausea and back pain. She is from home with her spouse. She has active with [facility] for PT/OT/RN/aide.                Action/Plan: Plan is for OR. CM following for d/c needs post surgery.   Expected Discharge Date:                  Expected Discharge Plan:     In-House Referral:     Discharge planning Services     Post Acute Care Choice:    Choice offered to:     DME Arranged:    DME Agency:     HH Arranged:    HH Agency:     Status of Service:  In process, will continue to follow  If discussed at Long Length of Stay Meetings, dates discussed:    Additional Comments:  Pollie Friar, RN 08/30/2017, 2:36 PM

## 2017-08-30 NOTE — Progress Notes (Signed)
Nurse tech took patient VS and BP was 83/55. Nurse rechecked a manual BP and got 90/55. Patient is asymptomatic.  Night nurse notify and will continue to monitor.

## 2017-08-30 NOTE — Progress Notes (Signed)
Patient UA resulted + for bacteria and patient stated that Zofran was not effective. New orders received. Arthor Captain LPN

## 2017-08-30 NOTE — Progress Notes (Signed)
PROGRESS NOTE    Megan [name]  GUY:[id number] DOB: 1951-08-18 DOA: 08/29/2017 PCP: Pleas Koch, NP   Chief Complaint  Patient presents with  . Nausea  . Emesis    Brief Narrative:  Consulted for nausea, vomiting, abdominal pain Assessment & Plan   Nausea, vomiting, abdominal pain -possibly secondary to constipation vs UTI -patient was placed on laxative and given an enema -patient was able to have a bowel movement overnight -continue to monitor closely, antiemetics PRN  UTI -UA: Many bacteria, large leukocytes, TNTC WBC -Urine culture pending -Continue ceftriaxone  L3 vertebral body fracture -MRI showed fracture with update millimeters of posterior displacement/retrolisthesis. Mild narrowing of the thecal sac at this level without cord or cauda equina compression. Resultant severe bilateral L3 foraminal stenosis, right worse left. -neurosurgery primary  Diabetes mellitus, type II -If patient continues hospitalization, recommend discontinuing metformin and placing on insulin sliding scale  DVT Prophylaxis    Code Status: Full  Family Communication: Family at bedside  Disposition Plan: Admitted, pending surgery on 09/02/2017  Consultants [facility] consult Neurosurgery consultant  Procedures  None  Antibiotics   Anti-infectives (From admission, onward)   Start     Dose/Rate Route Frequency Ordered Stop   08/29/17 2300  cefTRIAXone (ROCEPHIN) 1 g in dextrose 5 % 50 mL IVPB     1 g 100 mL/hr over 30 Minutes Intravenous Every 24 hours 08/29/17 2216        Subjective:   Su Ley seen and examined today. Was able to have a bowel movement. Denies further abdominal pain, nausea or vomiting. Denies current  dizziness, chest pain, shortness of breath.   Objective:   Vitals:   08/29/17 2100 08/30/17 0110 08/30/17 0506 08/30/17 0947  BP: (!) 170/82 (!) 157/74 (!) 114/54 (!) 136/58  Pulse: (!) 115 100 79 91  Resp: 18 16 16 16   Temp: 100.2 F (37.9 C)  99.5 F (37.5 C) 98.8 F (37.1 C) 98.4 F (36.9 C)  TempSrc: Oral Oral Oral Oral  SpO2: 99% 97% 96% 98%  Weight:      Height:        Intake/Output Summary (Last 24 hours) at 08/30/2017 1323 Last data filed at 08/30/2017 0700 Gross per 24 hour  Intake 50 ml  Output 800 ml  Net -750 ml   Filed Weights   08/29/17 0833  Weight: 72.6 kg (160 lb)    Exam  General: Well developed, well nourished, NAD, appears stated age  66: NCAT, mucous membranes moist.   Cardiovascular: S1 S2 auscultated, RRR, no murmrus  Respiratory: Clear to auscultation bilaterally with equal chest rise  Abdomen: Soft, nontender, nondistended, + bowel sounds  Extremities: warm dry without cyanosis clubbing or edema  Neuro: AAOx3, nonfocal  Psych: Normal affect and demeanor, pleasant   Data Reviewed: I have personally reviewed following labs and imaging studies  CBC: Recent Labs  Lab 08/29/17 0848  WBC 8.1  NEUTROABS 6.8  HGB 10.6*  HCT 32.3*  MCV 85.0  PLT 563   Basic Metabolic Panel: Recent Labs  Lab 08/29/17 0848  NA 134*  K 3.8  CL 101  CO2 22  GLUCOSE 184*  BUN 10  CREATININE 0.66  CALCIUM 8.8*   GFR: Estimated Creatinine Clearance: 71.7 mL/min (by C-G formula based on SCr of 0.66 mg/dL). Liver Function Tests: Recent Labs  Lab 08/29/17 0848  AST 16  ALT 13*  ALKPHOS 144*  BILITOT 0.9  PROT 6.5  ALBUMIN 3.1*   Recent Labs  Lab 08/29/17 0848  LIPASE 30   No results for input(s): AMMONIA in the last 168 hours. Coagulation Profile: No results for input(s): INR, PROTIME in the last 168 hours. Cardiac Enzymes: No results for input(s): CKTOTAL, CKMB, CKMBINDEX, TROPONINI in the last 168 hours. BNP (last 3 results) No results for input(s): PROBNP in the last 8760 hours. HbA1C: Recent Labs    08/29/17 1746  HGBA1C 6.0*   CBG: Recent Labs  Lab 08/30/17 0116 08/30/17 0612 08/30/17 1047  GLUCAP 194* 137* 140*   Lipid Profile: No results for input(s):  CHOL, HDL, LDLCALC, TRIG, CHOLHDL, LDLDIRECT in the last 72 hours. Thyroid Function Tests: No results for input(s): TSH, T4TOTAL, FREET4, T3FREE, THYROIDAB in the last 72 hours. Anemia Panel: No results for input(s): VITAMINB12, FOLATE, FERRITIN, TIBC, IRON, RETICCTPCT in the last 72 hours. Urine analysis:    Component Value Date/Time   COLORURINE YELLOW 08/29/2017 2100   APPEARANCEUR CLOUDY (A) 08/29/2017 2100   LABSPEC 1.018 08/29/2017 2100   PHURINE 6.0 08/29/2017 2100   GLUCOSEU NEGATIVE 08/29/2017 2100   HGBUR MODERATE (A) 08/29/2017 2100   BILIRUBINUR NEGATIVE 08/29/2017 2100   BILIRUBINUR 1+ 01/31/2017 0841   KETONESUR 5 (A) 08/29/2017 2100   PROTEINUR 30 (A) 08/29/2017 2100   UROBILINOGEN 0.2 01/31/2017 0841   UROBILINOGEN 0.2 06/13/2015 0936   NITRITE NEGATIVE 08/29/2017 2100   LEUKOCYTESUR LARGE (A) 08/29/2017 2100   Sepsis Labs: @LABRCNTIP (procalcitonin:4,lacticidven:4)  )No results found for this or any previous visit (from the past 240 hour(s)).    Radiology Studies: Mr Lumbar Spine W Wo Contrast  Result Date: 08/30/2017 CLINICAL DATA:  Initial evaluation for back pain, L3 vertebral fracture. EXAM: MRI LUMBAR SPINE WITHOUT AND WITH CONTRAST TECHNIQUE: Multiplanar and multiecho pulse sequences of the lumbar spine were obtained without and with intravenous contrast. CONTRAST:  51mL MULTIHANCE GADOBENATE DIMEGLUMINE 529 MG/ML IV SOLN COMPARISON:  Prior CT from 08/29/2017 as well as previous CT from 07/06/2017 and MRI from 06/05/2017. FINDINGS: Segmentation: Normal segmentation. Lowest well-formed disc labeled the L5-S1 level. Alignment: There is 8 mm of retrolisthesis of the upper lumbar spine relative to the lower lumbar spine at the level of the L3 vertebral body fracture. Mild levoscoliosis. Vertebrae: Extensive postsurgical changes from prior posterior PLIF at L4 through the sacrum, with interbody devices in place at L3-4, L4-5, and L5-S1. Patient also status post a  ALIF at L5-S1. Posterior fusion partially visualize within the lower thoracic spine. Acute to subacute somewhat curvilinear and oblique extending through the central aspect of the L3 vertebral body, corresponding to findings seen on prior CT. Associated 8 mm retrolisthesis of the posterior aspect of L3 relative to L4. Fracture lines extends through the L2-3 interspace, with fluid density present within the fracture itself. There is associated height loss of up to approximately 50%. Vertebral body height otherwise maintained. Reactive endplate changes with enhancement present at the inferior endplate of L2. Underlying bone marrow signal intensity within normal limits. No worrisome osseous lesions. subcentimeter hemangioma noted within the L2 vertebral body. Conus medullaris and cauda equina: Conus extends to the L2 level. Conus and cauda equina appear normal. Paraspinal and other soft tissues: Postoperative changes present within the soft tissues of the lower back. Persistent collection at the laminectomy site measures 1.1 x 2.9 x 4.3 cm, most likely postoperative seroma (series 10, image 21). Mild edema and enhancement within the psoas musculature bilaterally, likely due to the L3 fracture. Visualized visceral structures normal. Disc levels: L1-2: Mild disc bulge. Moderate facet hypertrophy.  No canal or foraminal stenosis. No impingement. L2-3: Chronic intervertebral disc space narrowing with diffuse disc bulge with disc desiccation and reactive endplate changes. Patient status post wide posterior decompression. No significant canal stenosis. Mild bilateral L2 foraminal narrowing. L3-4: L3 vertebral body fracture with up to 8 mm of posterior displacement of the fracture it fragments. There is indentation and flattening of the ventral thecal sac at this level which is somewhat compressed laterally (series 7, image 14). Patient status post wide posterior decompression. There is mild narrowing of the thecal sac without  significant compressive stenosis. Thecal sac measures 18 x 11 mm in size at this level (AP by transverse). Enhancing granulation tissue throughout the epidural space. There is severe bilateral L3 foraminal narrowing due to displacement by the fracture, right greater than left, with probable compression of the bilateral L3 nerve roots (series 4, image 5, image 11). L4-5: Status post posterior and interbody fusion with posterior decompression. No residual stenosis. L5-S1: Status post posterior, interbody, and anterior fusion, with posterior decompression. No residual canal stenosis. Approximate 1 cm nonenhancing soft tissue density at the level of the left lateral recess (series 11, image 7), likely a small residual disc fragment. Adjacent postoperative enhancement. This closely approximates the descending left S1 nerve root. IMPRESSION: 1. Complex L3 vertebral body fracture with up to 8 mm of posterior displacement/retrolisthesis. Mild narrowing of the thecal sac at this level without cord or cauda equina compression. Resultant severe bilateral L3 foraminal stenosis at this level, right worse than left. 2. Additional postsurgical changes from prior PLIF at L3 through the sacrum, with ALIF at L5-S1. No other significant residual stenosis within the lumbar spine. 3. Probable small residual disc fragment with surrounding postoperative enhancing granulation tissue at the level of the left lateral recess at L5-S1, closely approximating the descending left S1 nerve root. 4. 1.1 x 2.9 x 4.3 cm collection at the laminectomy site, likely postoperative seroma. Electronically Signed   By: Jeannine Boga M.D.   On: 08/30/2017 05:12   Ct Abdomen Pelvis W Contrast  Result Date: 08/29/2017 CLINICAL DATA:  Abdominal pain, diverticulitis suspected. Diarrhea with nausea and vomiting EXAM: CT ABDOMEN AND PELVIS WITH CONTRAST TECHNIQUE: Multidetector CT imaging of the abdomen and pelvis was performed using the standard  protocol following bolus administration of intravenous contrast. CONTRAST:  23mL ISOVUE-300 IOPAMIDOL (ISOVUE-300) INJECTION 61% COMPARISON:  Lumbar spine fluoroscopy 07/26/2017. Abdominal CT 07/08/2006 FINDINGS: Lower chest:  No contributory findings. Hepatobiliary: No focal liver abnormality.No evidence of biliary obstruction or stone. Pancreas: Unremarkable. Spleen: Unremarkable. Adrenals/Urinary Tract: Negative adrenals. Mild prominence of urothelial enhancement, but symmetric. Physiologic distension of the bladder. Stomach/Bowel: The rectum is distended by stool, measuring 8 cm. No superimposed inflammation is noted. Stool seen throughout the nondilated colon. Negative for small bowel obstruction. No appendicitis. Vascular/Lymphatic: No acute vascular abnormality. Extensive atherosclerotic plaque. No mass or adenopathy. Reproductive:Hysterectomy.  Negative adnexae. Other: No ascites or pneumoperitoneum. Musculoskeletal: Recent L5-S1 ALIF and posterior fusion extending to the pelvis. Associated hardware is in stable position. The left pelvic screw contacts the left iliac vein without hematoma or swelling. L3-4 and L4-5 with solid arthrodesis. Posterior fusion hardware continues to L4. Pedicle screws at L2 and L3 have been removed. There is an L3 body fracture extending horizontally along the superior endplate and vertically along the posterior body. There may be continuation through posterior-lateral arthrodesis bilaterally. Vacuum phenomenon in the body fracture. Posterior displacement that narrows the canal, although there has been previous decompressive laminectomy at this level. These  changes distort the narrow the spinal canal at L3-4, but there has been decompressive laminectomy at this level. Biforaminal L3 impingement is severe. Partially visualized thoracic fixation hardware and posterior decompression. These results were called by telephone at the time of interpretation on 08/29/2017 at 11:25 am to Dr.  Carmin Muskrat , who verbally acknowledged these results. IMPRESSION: 1. Over distended rectum by stool. 2. L3 body fracture with compression and posterior displacement. The fracture likely continues into bilateral posterior elements which have undergone remote arthrodesis. L3-4 biforaminal compression that is new and severe. 3. Recent L5-S1 ALIF and posterior fusion extending to the pelvis. Associated hardware is stable. Electronically Signed   By: Monte Fantasia M.D.   On: 08/29/2017 11:27     Scheduled Meds: . atorvastatin  20 mg Oral QPM  . bethanechol  5 mg Oral TID  . celecoxib  200 mg Oral Q12H  . cholecalciferol  2,000 Units Oral QPC lunch  . gabapentin  300 mg Oral TID  . insulin aspart  0-9 Units Subcutaneous TID WC  . lisinopril  5 mg Oral Daily  . metFORMIN  500 mg Oral BID WC  . pantoprazole  40 mg Oral Daily  . senna  1 tablet Oral BID  . sodium chloride flush  3 mL Intravenous Q12H  . tamsulosin  0.4 mg Oral QPC supper  . tiZANidine  2 mg Oral TID   Continuous Infusions: . sodium chloride Stopped (08/29/17 1040)  . sodium chloride    . sodium chloride    . cefTRIAXone (ROCEPHIN)  IV 1 g (08/30/17 0001)     LOS: 1 day   Time Spent in minutes   30 minutes  Cem Kosman D.O. on 08/30/2017 at 1:23 PM  Between 7am to 7pm - Pager - [phone]  After 7pm go to [email or web address] - password [email or web address]  And look for the night coverage person covering for me after hours  [facility] Office  [phone]

## 2017-08-31 DIAGNOSIS — R338 Other retention of urine: Secondary | ICD-10-CM

## 2017-08-31 LAB — GLUCOSE, CAPILLARY
GLUCOSE-CAPILLARY: 115 mg/dL — AB (ref 65–99)
GLUCOSE-CAPILLARY: 139 mg/dL — AB (ref 65–99)
GLUCOSE-CAPILLARY: 109 mg/dL — AB (ref 65–99)
Glucose-Capillary: 127 mg/dL — ABNORMAL HIGH (ref 65–99)

## 2017-08-31 MED ORDER — ENOXAPARIN SODIUM 40 MG/0.4ML ~~LOC~~ SOLN
40.0000 mg | SUBCUTANEOUS | Status: AC
  Administered 2017-08-31 – 2017-09-01 (×2): 40 mg via SUBCUTANEOUS
  Filled 2017-08-31 (×2): qty 0.4

## 2017-08-31 MED ORDER — INSULIN ASPART 100 UNIT/ML ~~LOC~~ SOLN: 0.0000 [IU] | Freq: Every day | SUBCUTANEOUS | Status: DC

## 2017-08-31 MED ORDER — INSULIN ASPART 100 UNIT/ML ~~LOC~~ SOLN
0.0000 [IU] | Freq: Three times a day (TID) | SUBCUTANEOUS | Status: DC
  Administered 2017-08-31: 1 [IU] via SUBCUTANEOUS
  Administered 2017-09-02: 2 [IU] via SUBCUTANEOUS

## 2017-08-31 NOTE — Plan of Care (Signed)
  Clinical Measurements: Will remain free from infection 08/31/2017 2313 - Progressing by Irish Lack, RN   Coping: Level of anxiety will decrease 08/31/2017 2313 - Progressing by Irish Lack, RN   Safety: Ability to remain free from injury will improve 08/31/2017 2313 - Progressing by Irish Lack, RN

## 2017-08-31 NOTE — Progress Notes (Signed)
No acute events Quad weakness and tremor on the right Surgery Friday, not tomorrow Will start lovenox in the mean time

## 2017-08-31 NOTE — Progress Notes (Signed)
PROGRESS NOTE    Megan [name]  TDD:[id number] DOB: [birth year] DOA: 08/29/2017 PCP: Pleas Koch, NP   Chief Complaint  Patient presents with  . Nausea  . Emesis    Brief Narrative:  Consulted for nausea, vomiting, abdominal pain Assessment & Plan   Nausea, vomiting, abdominal pain -possibly secondary to constipation vs UTI -patient was placed on laxative and given an enema -patient was able to have a bowel movement overnight -continue to monitor closely, antiemetics PRN  UTI -UA: Many bacteria, large leukocytes, TNTC WBC -Urine culture >100k EColi, sensitivities pending  -Continue ceftriaxone  L3 vertebral body fracture -MRI showed fracture with update millimeters of posterior displacement/retrolisthesis. Mild narrowing of the thecal sac at this level without cord or cauda equina compression. Resultant severe bilateral L3 foraminal stenosis, right worse left. -neurosurgery primary- planning for surgery on 09/02/2017  Diabetes mellitus, type II -discontinued metformin and placed on ISS with CBG monitoring   Urinary retention -patient supposedly self caths at home -foley catheter ordered as patient had 591ml retained on bladder scan  DVT Prophylaxis  lovenox  Code Status: Full  Family Communication: Family at bedside  Disposition Plan: Admitted, pending surgery on 09/02/2017  Consultants [facility] consult Neurosurgery consultant  Procedures  None  Antibiotics   Anti-infectives (From admission, onward)   Start     Dose/Rate Route Frequency Ordered Stop   08/29/17 2300  cefTRIAXone (ROCEPHIN) 1 g in dextrose 5 % 50 mL IVPB     1 g 100 mL/hr over 30 Minutes Intravenous Every 24 hours 08/29/17 2216        Subjective:   Su Ley seen and examined today. Denies further abdominal pain. Continues to have some nausea. Denies current chest pain, shortness of breath, vomiting, diarrhea, constipation.   Objective:   Vitals:   08/31/17 0033 08/31/17  0408 08/31/17 0858 08/31/17 1336  BP: (!) 100/57 (!) 117/53 (!) 150/76 (!) 123/53  Pulse: 72 78 85 67  Resp: 18 18 (!) 22 18  Temp: 98.4 F (36.9 C) 98.3 F (36.8 C) 98.9 F (37.2 C) 98.8 F (37.1 C)  TempSrc: Oral Oral Axillary Oral  SpO2: 100% 98% 99% 98%  Weight:      Height:        Intake/Output Summary (Last 24 hours) at 08/31/2017 1344 Last data filed at 08/31/2017 1100 Gross per 24 hour  Intake 2568.33 ml  Output 625 ml  Net 1943.33 ml   Filed Weights   08/29/17 0833  Weight: 72.6 kg (160 lb)    Exam (no change from exam on 08/30/2017)  General: Well developed, well nourished, NAD, appears stated age  66: NCAT, mucous membranes moist.   Cardiovascular: S1 S2 auscultated, RRR, no murmrus  Respiratory: Clear to auscultation bilaterally with equal chest rise  Abdomen: Soft, nontender, nondistended, + bowel sounds  Extremities: warm dry without cyanosis clubbing or edema  Neuro: AAOx3, nonfocal  Psych: Normal affect and demeanor, pleasant   Data Reviewed: I have personally reviewed following labs and imaging studies  CBC: Recent Labs  Lab 08/29/17 0848  WBC 8.1  NEUTROABS 6.8  HGB 10.6*  HCT 32.3*  MCV 85.0  PLT 623   Basic Metabolic Panel: Recent Labs  Lab 08/29/17 0848  NA 134*  K 3.8  CL 101  CO2 22  GLUCOSE 184*  BUN 10  CREATININE 0.66  CALCIUM 8.8*   GFR: Estimated Creatinine Clearance: 71.7 mL/min (by C-G formula based on SCr of 0.66 mg/dL). Liver Function Tests: Recent  Labs  Lab 08/29/17 0848  AST 16  ALT 13*  ALKPHOS 144*  BILITOT 0.9  PROT 6.5  ALBUMIN 3.1*   Recent Labs  Lab 08/29/17 0848  LIPASE 30   No results for input(s): AMMONIA in the last 168 hours. Coagulation Profile: No results for input(s): INR, PROTIME in the last 168 hours. Cardiac Enzymes: No results for input(s): CKTOTAL, CKMB, CKMBINDEX, TROPONINI in the last 168 hours. BNP (last 3 results) No results for input(s): PROBNP in the last 8760  hours. HbA1C: Recent Labs    08/29/17 1746  HGBA1C 6.0*   CBG: Recent Labs  Lab 08/30/17 1047 08/30/17 1713 08/30/17 2253 08/31/17 0622 08/31/17 1116  GLUCAP 140* 157* 158* 115* 139*   Lipid Profile: No results for input(s): CHOL, HDL, LDLCALC, TRIG, CHOLHDL, LDLDIRECT in the last 72 hours. Thyroid Function Tests: No results for input(s): TSH, T4TOTAL, FREET4, T3FREE, THYROIDAB in the last 72 hours. Anemia Panel: No results for input(s): VITAMINB12, FOLATE, FERRITIN, TIBC, IRON, RETICCTPCT in the last 72 hours. Urine analysis:    Component Value Date/Time   COLORURINE YELLOW 08/29/2017 2100   APPEARANCEUR CLOUDY (A) 08/29/2017 2100   LABSPEC 1.018 08/29/2017 2100   PHURINE 6.0 08/29/2017 2100   GLUCOSEU NEGATIVE 08/29/2017 2100   HGBUR MODERATE (A) 08/29/2017 2100   BILIRUBINUR NEGATIVE 08/29/2017 2100   BILIRUBINUR 1+ 01/31/2017 0841   KETONESUR 5 (A) 08/29/2017 2100   PROTEINUR 30 (A) 08/29/2017 2100   UROBILINOGEN 0.2 01/31/2017 0841   UROBILINOGEN 0.2 06/13/2015 0936   NITRITE NEGATIVE 08/29/2017 2100   LEUKOCYTESUR LARGE (A) 08/29/2017 2100   Sepsis Labs: @LABRCNTIP (procalcitonin:4,lacticidven:4)  ) Recent Results (from the past 240 hour(s))  Culture, Urine     Status: Abnormal (Preliminary result)   Collection Time: 08/29/17 11:30 PM  Result Value Ref Range Status   Specimen Description URINE, CATHETERIZED  Final   Special Requests Normal  Final   Culture >=100,000 COLONIES/mL ESCHERICHIA COLI (A)  Final   Report Status PENDING  Incomplete      Radiology Studies: Mr Lumbar Spine W Wo Contrast  Result Date: 08/30/2017 CLINICAL DATA:  Initial evaluation for back pain, L3 vertebral fracture. EXAM: MRI LUMBAR SPINE WITHOUT AND WITH CONTRAST TECHNIQUE: Multiplanar and multiecho pulse sequences of the lumbar spine were obtained without and with intravenous contrast. CONTRAST:  15mL MULTIHANCE GADOBENATE DIMEGLUMINE 529 MG/ML IV SOLN COMPARISON:  Prior CT  from 08/29/2017 as well as previous CT from 07/06/2017 and MRI from 06/05/2017. FINDINGS: Segmentation: Normal segmentation. Lowest well-formed disc labeled the L5-S1 level. Alignment: There is 8 mm of retrolisthesis of the upper lumbar spine relative to the lower lumbar spine at the level of the L3 vertebral body fracture. Mild levoscoliosis. Vertebrae: Extensive postsurgical changes from prior posterior PLIF at L4 through the sacrum, with interbody devices in place at L3-4, L4-5, and L5-S1. Patient also status post a ALIF at L5-S1. Posterior fusion partially visualize within the lower thoracic spine. Acute to subacute somewhat curvilinear and oblique extending through the central aspect of the L3 vertebral body, corresponding to findings seen on prior CT. Associated 8 mm retrolisthesis of the posterior aspect of L3 relative to L4. Fracture lines extends through the L2-3 interspace, with fluid density present within the fracture itself. There is associated height loss of up to approximately 50%. Vertebral body height otherwise maintained. Reactive endplate changes with enhancement present at the inferior endplate of L2. Underlying bone marrow signal intensity within normal limits. No worrisome osseous lesions. subcentimeter hemangioma noted within  the L2 vertebral body. Conus medullaris and cauda equina: Conus extends to the L2 level. Conus and cauda equina appear normal. Paraspinal and other soft tissues: Postoperative changes present within the soft tissues of the lower back. Persistent collection at the laminectomy site measures 1.1 x 2.9 x 4.3 cm, most likely postoperative seroma (series 10, image 21). Mild edema and enhancement within the psoas musculature bilaterally, likely due to the L3 fracture. Visualized visceral structures normal. Disc levels: L1-2: Mild disc bulge. Moderate facet hypertrophy. No canal or foraminal stenosis. No impingement. L2-3: Chronic intervertebral disc space narrowing with diffuse  disc bulge with disc desiccation and reactive endplate changes. Patient status post wide posterior decompression. No significant canal stenosis. Mild bilateral L2 foraminal narrowing. L3-4: L3 vertebral body fracture with up to 8 mm of posterior displacement of the fracture it fragments. There is indentation and flattening of the ventral thecal sac at this level which is somewhat compressed laterally (series 7, image 14). Patient status post wide posterior decompression. There is mild narrowing of the thecal sac without significant compressive stenosis. Thecal sac measures 18 x 11 mm in size at this level (AP by transverse). Enhancing granulation tissue throughout the epidural space. There is severe bilateral L3 foraminal narrowing due to displacement by the fracture, right greater than left, with probable compression of the bilateral L3 nerve roots (series 4, image 5, image 11). L4-5: Status post posterior and interbody fusion with posterior decompression. No residual stenosis. L5-S1: Status post posterior, interbody, and anterior fusion, with posterior decompression. No residual canal stenosis. Approximate 1 cm nonenhancing soft tissue density at the level of the left lateral recess (series 11, image 7), likely a small residual disc fragment. Adjacent postoperative enhancement. This closely approximates the descending left S1 nerve root. IMPRESSION: 1. Complex L3 vertebral body fracture with up to 8 mm of posterior displacement/retrolisthesis. Mild narrowing of the thecal sac at this level without cord or cauda equina compression. Resultant severe bilateral L3 foraminal stenosis at this level, right worse than left. 2. Additional postsurgical changes from prior PLIF at L3 through the sacrum, with ALIF at L5-S1. No other significant residual stenosis within the lumbar spine. 3. Probable small residual disc fragment with surrounding postoperative enhancing granulation tissue at the level of the left lateral recess  at L5-S1, closely approximating the descending left S1 nerve root. 4. 1.1 x 2.9 x 4.3 cm collection at the laminectomy site, likely postoperative seroma. Electronically Signed   By: Jeannine Boga M.D.   On: 08/30/2017 05:12     Scheduled Meds: . atorvastatin  20 mg Oral QPM  . bethanechol  5 mg Oral TID  . celecoxib  200 mg Oral Q12H  . cholecalciferol  2,000 Units Oral QPC lunch  . enoxaparin (LOVENOX) injection  40 mg Subcutaneous Q24H  . gabapentin  300 mg Oral TID  . insulin aspart  0-5 Units Subcutaneous QHS  . insulin aspart  0-9 Units Subcutaneous TID WC  . lisinopril  5 mg Oral Daily  . pantoprazole  40 mg Oral Daily  . senna  1 tablet Oral BID  . sodium chloride flush  3 mL Intravenous Q12H  . tamsulosin  0.4 mg Oral QPC supper  . tiZANidine  2 mg Oral TID   Continuous Infusions: . sodium chloride 125 mL/hr at 08/31/17 0200  . sodium chloride    . sodium chloride    . cefTRIAXone (ROCEPHIN)  IV Stopped (08/30/17 2348)     LOS: 2 days   Time  Spent in minutes   30 minutes  Agapito Hanway D.O. on 08/31/2017 at 1:44 PM  Between 7am to 7pm - Pager - [phone]  After 7pm go to [email or web address] - password [email or web address]  And look for the night coverage person covering for me after hours  [facility] Office  [phone]

## 2017-09-01 DIAGNOSIS — S32039A Unspecified fracture of third lumbar vertebra, initial encounter for closed fracture: Secondary | ICD-10-CM | POA: Diagnosis present

## 2017-09-01 HISTORY — DX: Unspecified fracture of third lumbar vertebra, initial encounter for closed fracture: S32.039A

## 2017-09-01 LAB — CBC
HEMATOCRIT: 26.7 % — AB (ref 36.0–46.0)
HEMOGLOBIN: 8.7 g/dL — AB (ref 12.0–15.0)
MCH: 28.2 pg (ref 26.0–34.0)
MCHC: 32.6 g/dL (ref 30.0–36.0)
MCV: 86.4 fL (ref 78.0–100.0)
Platelets: 181 10*3/uL (ref 150–400)
RBC: 3.09 MIL/uL — AB (ref 3.87–5.11)
RDW: 14.4 % (ref 11.5–15.5)
WBC: 3.1 10*3/uL — ABNORMAL LOW (ref 4.0–10.5)

## 2017-09-01 LAB — GLUCOSE, CAPILLARY
GLUCOSE-CAPILLARY: 106 mg/dL — AB (ref 65–99)
GLUCOSE-CAPILLARY: 183 mg/dL — AB (ref 65–99)
Glucose-Capillary: 120 mg/dL — ABNORMAL HIGH (ref 65–99)
Glucose-Capillary: 115 mg/dL — ABNORMAL HIGH (ref 65–99)

## 2017-09-01 LAB — URINE CULTURE: SPECIAL REQUESTS: NORMAL

## 2017-09-01 LAB — BASIC METABOLIC PANEL
Anion gap: 6 (ref 5–15)
BUN: 11 mg/dL (ref 6–20)
CHLORIDE: 110 mmol/L (ref 101–111)
CO2: 23 mmol/L (ref 22–32)
CREATININE: 0.68 mg/dL (ref 0.44–1.00)
Calcium: 8.4 mg/dL — ABNORMAL LOW (ref 8.9–10.3)
GFR calc non Af Amer: >60 mL/min (ref >60)
Glucose, Bld: 113 mg/dL — ABNORMAL HIGH (ref 65–99)
POTASSIUM: 3.7 mmol/L (ref 3.5–5.1)
Sodium: 139 mmol/L (ref 135–145)

## 2017-09-01 MED ORDER — FAMOTIDINE 10 MG PO TABS
10.0000 mg | ORAL_TABLET | Freq: Every day | ORAL | Status: DC
  Administered 2017-09-01 – 2017-09-06 (×6): 10 mg via ORAL
  Filled 2017-09-01 (×6): qty 1

## 2017-09-01 MED ORDER — SODIUM CHLORIDE 0.9 % IV SOLN
1250.0000 mg | INTRAVENOUS | Status: DC
  Filled 2017-09-01: qty 1250

## 2017-09-01 NOTE — Progress Notes (Signed)
Plan for surgery tomorrow We have discussed the planned procedure We have reviewed risks, benefits, and alternatives She wishes to proceed Surgery at 0730 tomorrow NPO after midnight

## 2017-09-01 NOTE — Progress Notes (Signed)
PROGRESS NOTE    Megan [name]  UXL:[id number] DOB: 1951-05-04 DOA: 08/29/2017 PCP: Pleas Koch, NP   Chief Complaint  Patient presents with  . Nausea  . Emesis    Brief Narrative:  Consulted for nausea, vomiting, abdominal pain Assessment & Plan   Nausea, vomiting, abdominal pain -possibly secondary to constipation vs UTI -patient was placed on laxative and given an enema -patient was able to have a bowel movement overnight -continue to monitor closely, antiemetics PRN -will add on H2 blocker to see if this helps her morning nausea (which states has been ongoing for quite some time)  UTI -UA: Many bacteria, large leukocytes, TNTC WBC -Urine culture >100k EColi, sensitivities pending  -Continue ceftriaxone  L3 vertebral body fracture -MRI showed fracture with update millimeters of posterior displacement/retrolisthesis. Mild narrowing of the thecal sac at this level without cord or cauda equina compression. Resultant severe bilateral L3 foraminal stenosis, right worse left. -neurosurgery primary- planning for surgery on 09/02/2017  Diabetes mellitus, type II -discontinued metformin and placed on ISS with CBG monitoring   Urinary retention -patient supposedly self caths at home -foley catheter ordered as patient had 553ml retained on bladder scan  DVT Prophylaxis  lovenox  Code Status: Full  Family Communication: Family at bedside  Disposition Plan: Admitted, pending surgery on 09/02/2017  Consultants [facility] consult Neurosurgery consultant  Procedures  None  Antibiotics   Anti-infectives (From admission, onward)   Start     Dose/Rate Route Frequency Ordered Stop   09/02/17 0715  vancomycin (VANCOCIN) 1,250 mg in sodium chloride 0.9 % 250 mL IVPB     1,250 mg 166.7 mL/hr over 90 Minutes Intravenous To Surgery 09/01/17 1306 09/03/17 0715   08/29/17 2300  cefTRIAXone (ROCEPHIN) 1 g in dextrose 5 % 50 mL IVPB     1 g 100 mL/hr over 30 Minutes Intravenous  Every 24 hours 08/29/17 2216        Subjective:   Megan [name] seen and examined today. Continues to have nausea in the morning. Denies any further abdominal pain, vomiting, diarrhea or constipation. Denies chest pain or shortness of breath, dizziness or headache.  Objective:   Vitals:   09/01/17 0442 09/01/17 0448 09/01/17 1036 09/01/17 1423  BP: (!) 167/73 (!) 148/70 136/64 128/65  Pulse: 62  (!) 54 (!) 58  Resp: 18  17 17   Temp: 98.3 F (36.8 C)  98.8 F (37.1 C) 98.8 F (37.1 C)  TempSrc: Oral  Oral Oral  SpO2: 98%  100% 99%  Weight:      Height:        Intake/Output Summary (Last 24 hours) at 09/01/2017 1513 Last data filed at 09/01/2017 5366 Gross per 24 hour  Intake 3410.08 ml  Output 2900 ml  Net 510.08 ml   Filed Weights   08/29/17 0833  Weight: 72.6 kg (160 lb)   Exam  General: Well developed, well nourished, NAD, appears stated age  19: NCAT,mucous membranes moist.   Cardiovascular: S1 S2 auscultated, RRR, no murmurs  Respiratory: Clear to auscultation bilaterally with equal chest rise  Abdomen: Soft, nontender, nondistended, + bowel sounds  Extremities: warm dry without cyanosis clubbing or edema  Neuro: AAOx3, nonfocal  Psych:Approriate mood and affect, pleasant  Data Reviewed: I have personally reviewed following labs and imaging studies  CBC: Recent Labs  Lab 08/29/17 0848 09/01/17 0412  WBC 8.1 3.1*  NEUTROABS 6.8  --   HGB 10.6* 8.7*  HCT 32.3* 26.7*  MCV 85.0 86.4  PLT 222 258   Basic Metabolic Panel: Recent Labs  Lab 08/29/17 0848 09/01/17 0412  NA 134* 139  K 3.8 3.7  CL 101 110  CO2 22 23  GLUCOSE 184* 113*  BUN 10 11  CREATININE 0.66 0.68  CALCIUM 8.8* 8.4*   GFR: Estimated Creatinine Clearance: 71.7 mL/min (by C-G formula based on SCr of 0.68 mg/dL). Liver Function Tests: Recent Labs  Lab 08/29/17 0848  AST 16  ALT 13*  ALKPHOS 144*  BILITOT 0.9  PROT 6.5  ALBUMIN 3.1*   Recent Labs  Lab  08/29/17 0848  LIPASE 30   No results for input(s): AMMONIA in the last 168 hours. Coagulation Profile: No results for input(s): INR, PROTIME in the last 168 hours. Cardiac Enzymes: No results for input(s): CKTOTAL, CKMB, CKMBINDEX, TROPONINI in the last 168 hours. BNP (last 3 results) No results for input(s): PROBNP in the last 8760 hours. HbA1C: Recent Labs    08/29/17 1746  HGBA1C 6.0*   CBG: Recent Labs  Lab 08/31/17 1116 08/31/17 1642 08/31/17 2123 09/01/17 0622 09/01/17 1146  GLUCAP 139* 109* 127* 106* 120*   Lipid Profile: No results for input(s): CHOL, HDL, LDLCALC, TRIG, CHOLHDL, LDLDIRECT in the last 72 hours. Thyroid Function Tests: No results for input(s): TSH, T4TOTAL, FREET4, T3FREE, THYROIDAB in the last 72 hours. Anemia Panel: No results for input(s): VITAMINB12, FOLATE, FERRITIN, TIBC, IRON, RETICCTPCT in the last 72 hours. Urine analysis:    Component Value Date/Time   COLORURINE YELLOW 08/29/2017 2100   APPEARANCEUR CLOUDY (A) 08/29/2017 2100   LABSPEC 1.018 08/29/2017 2100   PHURINE 6.0 08/29/2017 2100   GLUCOSEU NEGATIVE 08/29/2017 2100   HGBUR MODERATE (A) 08/29/2017 2100   BILIRUBINUR NEGATIVE 08/29/2017 2100   BILIRUBINUR 1+ 01/31/2017 0841   KETONESUR 5 (A) 08/29/2017 2100   PROTEINUR 30 (A) 08/29/2017 2100   UROBILINOGEN 0.2 01/31/2017 0841   UROBILINOGEN 0.2 06/13/2015 0936   NITRITE NEGATIVE 08/29/2017 2100   LEUKOCYTESUR LARGE (A) 08/29/2017 2100   Sepsis Labs: @LABRCNTIP (procalcitonin:4,lacticidven:4)  ) Recent Results (from the past 240 hour(s))  Culture, Urine     Status: Abnormal   Collection Time: 08/29/17 11:30 PM  Result Value Ref Range Status   Specimen Description URINE, CATHETERIZED  Final   Special Requests Normal  Final   Culture >=100,000 COLONIES/mL ESCHERICHIA COLI (A)  Final   Report Status 09/01/2017 FINAL  Final   Organism ID, Bacteria ESCHERICHIA COLI (A)  Final      Susceptibility   Escherichia coli -  MIC*    AMPICILLIN >=32 RESISTANT Resistant     CEFAZOLIN <=4 SENSITIVE Sensitive     CEFTRIAXONE <=1 SENSITIVE Sensitive     CIPROFLOXACIN <=0.25 SENSITIVE Sensitive     GENTAMICIN <=1 SENSITIVE Sensitive     IMIPENEM <=0.25 SENSITIVE Sensitive     NITROFURANTOIN <=16 SENSITIVE Sensitive     TRIMETH/SULFA <=20 SENSITIVE Sensitive     AMPICILLIN/SULBACTAM 16 INTERMEDIATE Intermediate     PIP/TAZO <=4 SENSITIVE Sensitive     Extended ESBL NEGATIVE Sensitive     * >=100,000 COLONIES/mL ESCHERICHIA COLI      Radiology Studies: No results found.   Scheduled Meds: . atorvastatin  20 mg Oral QPM  . bethanechol  5 mg Oral TID  . celecoxib  200 mg Oral Q12H  . cholecalciferol  2,000 Units Oral QPC lunch  . famotidine  10 mg Oral QHS  . gabapentin  300 mg Oral TID  . insulin aspart  0-5 Units  Subcutaneous QHS  . insulin aspart  0-9 Units Subcutaneous TID WC  . lisinopril  5 mg Oral Daily  . pantoprazole  40 mg Oral Daily  . senna  1 tablet Oral BID  . sodium chloride flush  3 mL Intravenous Q12H  . tamsulosin  0.4 mg Oral QPC supper  . tiZANidine  2 mg Oral TID   Continuous Infusions: . sodium chloride 125 mL/hr at 09/01/17 1155  . sodium chloride    . sodium chloride    . cefTRIAXone (ROCEPHIN)  IV 1 g (08/31/17 2334)  . [START ON 09/02/2017] vancomycin       LOS: 3 days   Time Spent in minutes   30 minutes  Tiona Ruane D.O. on 09/01/2017 at 3:13 PM  Between 7am to 7pm - Pager - [phone]  After 7pm go to [email or web address] - password [email or web address]  And look for the night coverage person covering for me after hours  [facility] Office  [phone]

## 2017-09-02 ENCOUNTER — Inpatient Hospital Stay (HOSPITAL_COMMUNITY): Payer: BLUE CROSS/BLUE SHIELD

## 2017-09-02 ENCOUNTER — Inpatient Hospital Stay (HOSPITAL_COMMUNITY): Payer: BLUE CROSS/BLUE SHIELD | Admitting: Certified Registered"

## 2017-09-02 ENCOUNTER — Encounter (HOSPITAL_COMMUNITY)
Admission: EM | Disposition: A | Discharge status: Rehabilitation Facility | Payer: Self-pay | Source: Home / Self Care | Attending: Neurological Surgery

## 2017-09-02 HISTORY — PX: APPLICATION OF ROBOTIC ASSISTANCE FOR SPINAL PROCEDURE: SHX6753

## 2017-09-02 HISTORY — PX: POSTERIOR LUMBAR FUSION 4 WITH HARDWARE REMOVAL: SHX6038

## 2017-09-02 LAB — GLUCOSE, CAPILLARY
GLUCOSE-CAPILLARY: 204 mg/dL — AB (ref 65–99)
Glucose-Capillary: 106 mg/dL — ABNORMAL HIGH (ref 65–99)
Glucose-Capillary: 196 mg/dL — ABNORMAL HIGH (ref 65–99)
Glucose-Capillary: 257 mg/dL — ABNORMAL HIGH (ref 65–99)

## 2017-09-02 LAB — TYPE AND SCREEN
ABO/RH(D): A POS
ANTIBODY SCREEN: NEGATIVE

## 2017-09-02 LAB — SURGICAL PCR SCREEN
MRSA, PCR: NEGATIVE
Staphylococcus aureus: NEGATIVE

## 2017-09-02 SURGERY — POSTERIOR LUMBAR FUSION 4 WITH HARDWARE REMOVAL
Anesthesia: General

## 2017-09-02 MED ORDER — SCOPOLAMINE 1 MG/3DAYS TD PT72
MEDICATED_PATCH | TRANSDERMAL | Status: AC
  Filled 2017-09-02: qty 1

## 2017-09-02 MED ORDER — FLEET ENEMA 7-19 GM/118ML RE ENEM: 1.0000 | ENEMA | Freq: Once | RECTAL | Status: DC | PRN

## 2017-09-02 MED ORDER — ESMOLOL HCL 100 MG/10ML IV SOLN
INTRAVENOUS | Status: DC | PRN
  Administered 2017-09-02: 30 mg via INTRAVENOUS

## 2017-09-02 MED ORDER — SODIUM CHLORIDE 0.9 % IJ SOLN
INTRAMUSCULAR | Status: AC
  Filled 2017-09-02: qty 10

## 2017-09-02 MED ORDER — ROCURONIUM BROMIDE 10 MG/ML (PF) SYRINGE
PREFILLED_SYRINGE | INTRAVENOUS | Status: DC | PRN
  Administered 2017-09-02: 20 mg via INTRAVENOUS
  Administered 2017-09-02: 10 mg via INTRAVENOUS
  Administered 2017-09-02: 50 mg via INTRAVENOUS
  Administered 2017-09-02: 10 mg via INTRAVENOUS
  Administered 2017-09-02: 20 mg via INTRAVENOUS
  Administered 2017-09-02: 10 mg via INTRAVENOUS

## 2017-09-02 MED ORDER — OXYCODONE HCL ER 15 MG PO T12A: 30.0000 mg | EXTENDED_RELEASE_TABLET | Freq: Two times a day (BID) | ORAL | Status: DC

## 2017-09-02 MED ORDER — SODIUM CHLORIDE 0.9% FLUSH
3.0000 mL | Freq: Two times a day (BID) | INTRAVENOUS | Status: DC
  Administered 2017-09-03 – 2017-09-06 (×6): 3 mL via INTRAVENOUS

## 2017-09-02 MED ORDER — SODIUM CHLORIDE 0.9 % IV SOLN: 250.0000 mL | INTRAVENOUS | Status: DC

## 2017-09-02 MED ORDER — LIDOCAINE 2% (20 MG/ML) 5 ML SYRINGE
INTRAMUSCULAR | Status: AC
  Filled 2017-09-02: qty 5

## 2017-09-02 MED ORDER — ESMOLOL HCL 100 MG/10ML IV SOLN
INTRAVENOUS | Status: AC
  Filled 2017-09-02: qty 10

## 2017-09-02 MED ORDER — TRANEXAMIC ACID 1000 MG/10ML IV SOLN
2000.0000 mg | INTRAVENOUS | Status: DC
  Filled 2017-09-02: qty 20

## 2017-09-02 MED ORDER — VANCOMYCIN HCL 1000 MG IV SOLR
INTRAVENOUS | Status: DC | PRN
  Administered 2017-09-02: 1250 mg via INTRAVENOUS

## 2017-09-02 MED ORDER — ROCURONIUM BROMIDE 10 MG/ML (PF) SYRINGE
PREFILLED_SYRINGE | INTRAVENOUS | Status: AC
  Filled 2017-09-02: qty 5

## 2017-09-02 MED ORDER — SCOPOLAMINE 1 MG/3DAYS TD PT72
MEDICATED_PATCH | TRANSDERMAL | Status: DC | PRN
  Administered 2017-09-02: 1 via TRANSDERMAL

## 2017-09-02 MED ORDER — MORPHINE SULFATE ER 15 MG PO TBCR
30.0000 mg | EXTENDED_RELEASE_TABLET | Freq: Two times a day (BID) | ORAL | Status: DC
  Administered 2017-09-02: 30 mg via ORAL
  Administered 2017-09-03: 15 mg via ORAL
  Administered 2017-09-03 – 2017-09-07 (×8): 30 mg via ORAL
  Filled 2017-09-02 (×10): qty 2

## 2017-09-02 MED ORDER — DIAZEPAM 5 MG PO TABS
5.0000 mg | ORAL_TABLET | Freq: Four times a day (QID) | ORAL | Status: DC | PRN
  Administered 2017-09-04 – 2017-09-05 (×3): 5 mg via ORAL
  Filled 2017-09-02 (×3): qty 1

## 2017-09-02 MED ORDER — VANCOMYCIN HCL 1000 MG IV SOLR
INTRAVENOUS | Status: DC | PRN
  Administered 2017-09-02: 1000 mg

## 2017-09-02 MED ORDER — VANCOMYCIN HCL 1000 MG IV SOLR
INTRAVENOUS | Status: AC
  Filled 2017-09-02: qty 1000

## 2017-09-02 MED ORDER — BACITRACIN 50000 UNITS IM SOLR
INTRAMUSCULAR | Status: DC | PRN
  Administered 2017-09-02: 07:00:00

## 2017-09-02 MED ORDER — LIDOCAINE-EPINEPHRINE 2 %-1:100000 IJ SOLN
INTRAMUSCULAR | Status: AC
  Filled 2017-09-02: qty 1

## 2017-09-02 MED ORDER — DOCUSATE SODIUM 100 MG PO CAPS
100.0000 mg | ORAL_CAPSULE | Freq: Two times a day (BID) | ORAL | Status: DC
  Administered 2017-09-02 – 2017-09-07 (×10): 100 mg via ORAL
  Filled 2017-09-02 (×10): qty 1

## 2017-09-02 MED ORDER — SODIUM CHLORIDE 0.9 % IJ SOLN
INTRAMUSCULAR | Status: AC
  Filled 2017-09-02: qty 20

## 2017-09-02 MED ORDER — DIPHENHYDRAMINE HCL 50 MG/ML IJ SOLN
INTRAMUSCULAR | Status: DC | PRN
  Administered 2017-09-02: 25 mg via INTRAVENOUS

## 2017-09-02 MED ORDER — ONDANSETRON HCL 4 MG/2ML IJ SOLN
INTRAMUSCULAR | Status: DC | PRN
Start: 2017-09-02 — End: 2017-09-02
  Administered 2017-09-02: 4 mg via INTRAVENOUS

## 2017-09-02 MED ORDER — DEXAMETHASONE SODIUM PHOSPHATE 10 MG/ML IJ SOLN
INTRAMUSCULAR | Status: DC | PRN
  Administered 2017-09-02: 10 mg via INTRAVENOUS

## 2017-09-02 MED ORDER — CELECOXIB 200 MG PO CAPS: 200.0000 mg | ORAL_CAPSULE | Freq: Once | ORAL | Status: DC

## 2017-09-02 MED ORDER — THROMBIN (RECOMBINANT) 20000 UNITS EX SOLR
CUTANEOUS | Status: AC
  Filled 2017-09-02: qty 20000

## 2017-09-02 MED ORDER — KETAMINE HCL 100 MG/ML IJ SOLN
INTRAMUSCULAR | Status: AC
  Filled 2017-09-02: qty 1

## 2017-09-02 MED ORDER — INSULIN ASPART 100 UNIT/ML ~~LOC~~ SOLN
0.0000 [IU] | Freq: Every day | SUBCUTANEOUS | Status: DC
  Administered 2017-09-02 – 2017-09-04 (×2): 2 [IU] via SUBCUTANEOUS

## 2017-09-02 MED ORDER — GLYCOPYRROLATE 0.2 MG/ML IV SOSY
PREFILLED_SYRINGE | INTRAVENOUS | Status: DC | PRN
  Administered 2017-09-02: .2 mg via INTRAVENOUS

## 2017-09-02 MED ORDER — GABAPENTIN 300 MG PO CAPS
300.0000 mg | ORAL_CAPSULE | Freq: Three times a day (TID) | ORAL | Status: DC
  Administered 2017-09-02 – 2017-09-07 (×15): 300 mg via ORAL
  Filled 2017-09-02 (×15): qty 1

## 2017-09-02 MED ORDER — MIDAZOLAM HCL 5 MG/5ML IJ SOLN
INTRAMUSCULAR | Status: DC | PRN
  Administered 2017-09-02: 2 mg via INTRAVENOUS

## 2017-09-02 MED ORDER — INSULIN ASPART 100 UNIT/ML ~~LOC~~ SOLN
0.0000 [IU] | Freq: Three times a day (TID) | SUBCUTANEOUS | Status: DC
  Administered 2017-09-03 (×3): 2 [IU] via SUBCUTANEOUS
  Administered 2017-09-04: 1 [IU] via SUBCUTANEOUS
  Administered 2017-09-04: 2 [IU] via SUBCUTANEOUS
  Administered 2017-09-05: 1 [IU] via SUBCUTANEOUS
  Administered 2017-09-05: 3 [IU] via SUBCUTANEOUS
  Administered 2017-09-05: 2 [IU] via SUBCUTANEOUS
  Administered 2017-09-06 (×2): 1 [IU] via SUBCUTANEOUS
  Administered 2017-09-06 – 2017-09-07 (×3): 2 [IU] via SUBCUTANEOUS
  Administered 2017-09-07: 1 [IU] via SUBCUTANEOUS

## 2017-09-02 MED ORDER — BUPIVACAINE HCL (PF) 0.5 % IJ SOLN
INTRAMUSCULAR | Status: DC | PRN
  Administered 2017-09-02: 30 mL

## 2017-09-02 MED ORDER — VANCOMYCIN HCL IN DEXTROSE 750-5 MG/150ML-% IV SOLN
750.0000 mg | Freq: Two times a day (BID) | INTRAVENOUS | Status: DC
  Administered 2017-09-02 – 2017-09-07 (×10): 750 mg via INTRAVENOUS
  Filled 2017-09-02 (×12): qty 150

## 2017-09-02 MED ORDER — PHENYLEPHRINE 40 MCG/ML (10ML) SYRINGE FOR IV PUSH (FOR BLOOD PRESSURE SUPPORT)
PREFILLED_SYRINGE | INTRAVENOUS | Status: DC | PRN
  Administered 2017-09-02: 80 ug via INTRAVENOUS

## 2017-09-02 MED ORDER — THROMBIN (RECOMBINANT) 5000 UNITS EX SOLR
CUTANEOUS | Status: AC
  Filled 2017-09-02: qty 5000

## 2017-09-02 MED ORDER — KETAMINE HCL 10 MG/ML IJ SOLN
INTRAMUSCULAR | Status: DC | PRN
  Administered 2017-09-02 (×3): 10 mg via INTRAVENOUS

## 2017-09-02 MED ORDER — PANTOPRAZOLE SODIUM 40 MG IV SOLR: 40.0000 mg | Freq: Every day | INTRAVENOUS | Status: DC

## 2017-09-02 MED ORDER — ACETAMINOPHEN 650 MG RE SUPP: 650.0000 mg | RECTAL | Status: DC | PRN

## 2017-09-02 MED ORDER — SUGAMMADEX SODIUM 200 MG/2ML IV SOLN
INTRAVENOUS | Status: DC | PRN
  Administered 2017-09-02: 200 mg via INTRAVENOUS

## 2017-09-02 MED ORDER — 0.9 % SODIUM CHLORIDE (POUR BTL) OPTIME
TOPICAL | Status: DC | PRN
  Administered 2017-09-02: 1000 mL

## 2017-09-02 MED ORDER — BUPIVACAINE LIPOSOME 1.3 % IJ SUSP
20.0000 mL | INTRAMUSCULAR | Status: AC
  Administered 2017-09-02: 20 mL
  Filled 2017-09-02: qty 20

## 2017-09-02 MED ORDER — FENTANYL CITRATE (PF) 100 MCG/2ML IJ SOLN
INTRAMUSCULAR | Status: DC | PRN
  Administered 2017-09-02: 25 ug via INTRAVENOUS
  Administered 2017-09-02 (×3): 50 ug via INTRAVENOUS
  Administered 2017-09-02: 25 ug via INTRAVENOUS
  Administered 2017-09-02 (×2): 50 ug via INTRAVENOUS
  Administered 2017-09-02: 100 ug via INTRAVENOUS

## 2017-09-02 MED ORDER — ALBUMIN HUMAN 5 % IV SOLN
INTRAVENOUS | Status: DC | PRN
  Administered 2017-09-02: 10:00:00 via INTRAVENOUS

## 2017-09-02 MED ORDER — GABAPENTIN 300 MG PO CAPS: 300.0000 mg | ORAL_CAPSULE | Freq: Once | ORAL | Status: DC

## 2017-09-02 MED ORDER — FENTANYL CITRATE (PF) 250 MCG/5ML IJ SOLN
INTRAMUSCULAR | Status: AC
  Filled 2017-09-02: qty 5

## 2017-09-02 MED ORDER — DEXAMETHASONE SODIUM PHOSPHATE 10 MG/ML IJ SOLN
INTRAMUSCULAR | Status: AC
  Filled 2017-09-02: qty 1

## 2017-09-02 MED ORDER — HYDROMORPHONE HCL 1 MG/ML IJ SOLN: 0.2500 mg | INTRAMUSCULAR | Status: DC | PRN

## 2017-09-02 MED ORDER — SODIUM CHLORIDE 0.9 % IV SOLN
INTRAVENOUS | Status: DC
  Administered 2017-09-02: 23:00:00 via INTRAVENOUS

## 2017-09-02 MED ORDER — BUPIVACAINE HCL (PF) 0.5 % IJ SOLN
INTRAMUSCULAR | Status: AC
  Filled 2017-09-02: qty 30

## 2017-09-02 MED ORDER — BUPIVACAINE-EPINEPHRINE (PF) 0.5% -1:200000 IJ SOLN
INTRAMUSCULAR | Status: AC
  Filled 2017-09-02: qty 30

## 2017-09-02 MED ORDER — SENNA 8.6 MG PO TABS
1.0000 | ORAL_TABLET | Freq: Two times a day (BID) | ORAL | Status: DC
  Administered 2017-09-02 – 2017-09-07 (×10): 8.6 mg via ORAL
  Filled 2017-09-02 (×10): qty 1

## 2017-09-02 MED ORDER — TRANEXAMIC ACID 1000 MG/10ML IV SOLN
1000.0000 mg | Freq: Once | INTRAVENOUS | Status: AC
  Administered 2017-09-02: 1000 mg via INTRAVENOUS
  Filled 2017-09-02: qty 10

## 2017-09-02 MED ORDER — BUPIVACAINE-EPINEPHRINE (PF) 0.5% -1:200000 IJ SOLN
INTRAMUSCULAR | Status: DC | PRN
  Administered 2017-09-02: 30 mL

## 2017-09-02 MED ORDER — CELECOXIB 200 MG PO CAPS
200.0000 mg | ORAL_CAPSULE | Freq: Two times a day (BID) | ORAL | Status: DC
  Administered 2017-09-02 – 2017-09-07 (×10): 200 mg via ORAL
  Filled 2017-09-02 (×10): qty 1

## 2017-09-02 MED ORDER — PROPOFOL 10 MG/ML IV BOLUS
INTRAVENOUS | Status: DC | PRN
Start: 2017-09-02 — End: 2017-09-02
  Administered 2017-09-02: 150 mg via INTRAVENOUS

## 2017-09-02 MED ORDER — THROMBIN (RECOMBINANT) 5000 UNITS EX SOLR
CUTANEOUS | Status: DC | PRN
  Administered 2017-09-02: 5000 [IU] via TOPICAL

## 2017-09-02 MED ORDER — ONDANSETRON HCL 4 MG/2ML IJ SOLN
INTRAMUSCULAR | Status: AC
  Filled 2017-09-02: qty 2

## 2017-09-02 MED ORDER — LACTATED RINGERS IV SOLN
INTRAVENOUS | Status: DC | PRN
  Administered 2017-09-02 (×2): via INTRAVENOUS

## 2017-09-02 MED ORDER — SCOPOLAMINE 1 MG/3DAYS TD PT72
1.0000 | MEDICATED_PATCH | TRANSDERMAL | Status: DC
  Administered 2017-09-05: 1.5 mg via TRANSDERMAL
  Filled 2017-09-02 (×2): qty 1

## 2017-09-02 MED ORDER — ONDANSETRON HCL 4 MG PO TABS
4.0000 mg | ORAL_TABLET | Freq: Four times a day (QID) | ORAL | Status: DC | PRN
Start: 2017-09-02 — End: 2017-09-07
  Administered 2017-09-03 – 2017-09-06 (×4): 4 mg via ORAL
  Filled 2017-09-02 (×4): qty 1

## 2017-09-02 MED ORDER — ZOLPIDEM TARTRATE 5 MG PO TABS: 5.0000 mg | ORAL_TABLET | Freq: Every evening | ORAL | Status: DC | PRN

## 2017-09-02 MED ORDER — ONDANSETRON HCL 4 MG/2ML IJ SOLN
4.0000 mg | Freq: Four times a day (QID) | INTRAMUSCULAR | Status: DC | PRN
  Filled 2017-09-02: qty 2

## 2017-09-02 MED ORDER — PHENYLEPHRINE HCL 10 MG/ML IJ SOLN
INTRAVENOUS | Status: DC | PRN
  Administered 2017-09-02: 25 ug/min via INTRAVENOUS

## 2017-09-02 MED ORDER — PHENOL 1.4 % MT LIQD
1.0000 | OROMUCOSAL | Status: DC | PRN
  Filled 2017-09-02: qty 177

## 2017-09-02 MED ORDER — ROCURONIUM BROMIDE 10 MG/ML (PF) SYRINGE
PREFILLED_SYRINGE | INTRAVENOUS | Status: AC
  Filled 2017-09-02: qty 10

## 2017-09-02 MED ORDER — HEMOSTATIC AGENTS (NO CHARGE) OPTIME
TOPICAL | Status: DC | PRN
  Administered 2017-09-02: 1 via TOPICAL

## 2017-09-02 MED ORDER — HYDROCODONE-ACETAMINOPHEN 7.5-325 MG PO TABS
1.0000 | ORAL_TABLET | ORAL | Status: DC | PRN
  Administered 2017-09-03 – 2017-09-04 (×2): 2 via ORAL
  Filled 2017-09-02: qty 2
  Filled 2017-09-02: qty 1
  Filled 2017-09-02: qty 2

## 2017-09-02 MED ORDER — LIDOCAINE-EPINEPHRINE 2 %-1:100000 IJ SOLN
INTRAMUSCULAR | Status: DC | PRN
  Administered 2017-09-02: 20 mL via INTRADERMAL

## 2017-09-02 MED ORDER — MIDAZOLAM HCL 2 MG/2ML IJ SOLN
INTRAMUSCULAR | Status: AC
  Filled 2017-09-02: qty 2

## 2017-09-02 MED ORDER — PROMETHAZINE HCL 25 MG/ML IJ SOLN: 6.2500 mg | INTRAMUSCULAR | Status: DC | PRN

## 2017-09-02 MED ORDER — BISACODYL 5 MG PO TBEC
5.0000 mg | DELAYED_RELEASE_TABLET | Freq: Every day | ORAL | Status: DC | PRN
  Administered 2017-09-06: 5 mg via ORAL
  Filled 2017-09-02: qty 1

## 2017-09-02 MED ORDER — PROPOFOL 10 MG/ML IV BOLUS
INTRAVENOUS | Status: AC
  Filled 2017-09-02: qty 20

## 2017-09-02 MED ORDER — MENTHOL 3 MG MT LOZG: 1.0000 | LOZENGE | OROMUCOSAL | Status: DC | PRN

## 2017-09-02 MED ORDER — ACETAMINOPHEN 325 MG PO TABS: 650.0000 mg | ORAL_TABLET | ORAL | Status: DC | PRN

## 2017-09-02 MED ORDER — SODIUM CHLORIDE 0.9 % IJ SOLN
INTRAMUSCULAR | Status: DC | PRN
  Administered 2017-09-02: 50 mL via INTRAVENOUS

## 2017-09-02 MED ORDER — LIDOCAINE 2% (20 MG/ML) 5 ML SYRINGE
INTRAMUSCULAR | Status: DC | PRN
  Administered 2017-09-02: 60 mg via INTRAVENOUS

## 2017-09-02 MED ORDER — SODIUM CHLORIDE 0.9% FLUSH
3.0000 mL | INTRAVENOUS | Status: DC | PRN
  Administered 2017-09-03: 3 mL via INTRAVENOUS
  Filled 2017-09-02: qty 3

## 2017-09-02 SURGICAL SUPPLY — 122 items
ADH SKN CLS APL DERMABOND .7 (GAUZE/BANDAGES/DRESSINGS) ×1
APL SKNCLS STERI-STRIP NONHPOA (GAUZE/BANDAGES/DRESSINGS) ×1
BAG DECANTER FOR FLEXI CONT (MISCELLANEOUS) ×4 IMPLANT
BENZOIN TINCTURE PRP APPL 2/3 (GAUZE/BANDAGES/DRESSINGS) ×2 IMPLANT
BIT DRILL LONG 3.0X30 (BIT) ×1 IMPLANT
BIT DRILL LONG 3X80 (BIT) IMPLANT
BIT DRILL LONG 4X80 (BIT) IMPLANT
BIT DRILL SHORT 3.0X30 (BIT) IMPLANT
BIT DRILL SHORT 3X80 (BIT) IMPLANT
BLADE CLIPPER SURG (BLADE) IMPLANT
BLADE SURG 11 STRL SS (BLADE) ×2 IMPLANT
BONE CANC CHIPS 40CC CAN1/2 (Bone Implant) ×2 IMPLANT
BRIDGE VARIABLE 40-51 (Miscellaneous) ×1 IMPLANT
BUR MATCHSTICK NEURO 3.0 LAGG (BURR) ×2 IMPLANT
BUR ROUND FLUTED 5 RND (BURR) ×1 IMPLANT
CANISTER SUCT 3000ML PPV (MISCELLANEOUS) ×3 IMPLANT
CARTRIDGE OIL MAESTRO DRILL (MISCELLANEOUS) ×1 IMPLANT
CHIPS CANC BONE 40CC CAN1/2 (Bone Implant) ×1 IMPLANT
CHLORAPREP W/TINT 26ML (MISCELLANEOUS) ×2 IMPLANT
CONT SPEC 4OZ CLIKSEAL STRL BL (MISCELLANEOUS) ×3 IMPLANT
COUNTER NEEDLE 20 DBL MAG RED (NEEDLE) ×1 IMPLANT
DECANTER SPIKE VIAL GLASS SM (MISCELLANEOUS) ×2 IMPLANT
DERMABOND ADVANCED (GAUZE/BANDAGES/DRESSINGS) ×1
DERMABOND ADVANCED .7 DNX12 (GAUZE/BANDAGES/DRESSINGS) ×1 IMPLANT
DIFFUSER DRILL AIR PNEUMATIC (MISCELLANEOUS) ×2 IMPLANT
DRAPE C-ARM 42X72 X-RAY (DRAPES) ×4 IMPLANT
DRAPE C-ARMOR (DRAPES) ×2 IMPLANT
DRAPE INCISE IOBAN 66X45 STRL (DRAPES) ×1 IMPLANT
DRAPE MICROSCOPE LEICA (MISCELLANEOUS) IMPLANT
DRAPE POUCH INSTRU U-SHP 10X18 (DRAPES) ×2 IMPLANT
DRAPE SHEET LG 3/4 BI-LAMINATE (DRAPES) ×2 IMPLANT
DRAPE SURG 17X23 STRL (DRAPES) ×2 IMPLANT
DRESSING PREVENA PLUS CUSTOM (GAUZE/BANDAGES/DRESSINGS) IMPLANT
DRSG OPSITE POSTOP 3X4 (GAUZE/BANDAGES/DRESSINGS) ×1 IMPLANT
DRSG OPSITE POSTOP 4X10 (GAUZE/BANDAGES/DRESSINGS) ×1 IMPLANT
DRSG PREVENA PLUS CUSTOM (GAUZE/BANDAGES/DRESSINGS) ×2
ELECT BLADE 4.0 EZ CLEAN MEGAD (MISCELLANEOUS) ×2
ELECT COATED BLADE 2.86 ST (ELECTRODE) ×2 IMPLANT
ELECT REM PT RETURN 9FT ADLT (ELECTROSURGICAL) ×2
ELECTRODE BLDE 4.0 EZ CLN MEGD (MISCELLANEOUS) IMPLANT
ELECTRODE REM PT RTRN 9FT ADLT (ELECTROSURGICAL) ×1 IMPLANT
GAUZE SPONGE 4X4 12PLY STRL (GAUZE/BANDAGES/DRESSINGS) IMPLANT
GAUZE SPONGE 4X4 16PLY XRAY LF (GAUZE/BANDAGES/DRESSINGS) IMPLANT
GLOVE BIO SURGEON STRL SZ 6.5 (GLOVE) ×1 IMPLANT
GLOVE BIO SURGEON STRL SZ7 (GLOVE) IMPLANT
GLOVE BIO SURGEON STRL SZ7.5 (GLOVE) IMPLANT
GLOVE BIOGEL PI IND STRL 7.0 (GLOVE) IMPLANT
GLOVE BIOGEL PI IND STRL 7.5 (GLOVE) ×2 IMPLANT
GLOVE BIOGEL PI INDICATOR 7.0 (GLOVE) ×4
GLOVE BIOGEL PI INDICATOR 7.5 (GLOVE) ×2
GLOVE ECLIPSE 7.5 STRL STRAW (GLOVE) ×1 IMPLANT
GLOVE ECLIPSE 8.5 STRL (GLOVE) ×1 IMPLANT
GLOVE INDICATOR 7.5 STRL GRN (GLOVE) ×1 IMPLANT
GLOVE SS BIOGEL STRL SZ 7.5 (GLOVE) ×3 IMPLANT
GLOVE SUPERSENSE BIOGEL SZ 7.5 (GLOVE) ×3
GOWN STRL REUS W/ TWL LRG LVL3 (GOWN DISPOSABLE) ×2 IMPLANT
GOWN STRL REUS W/ TWL XL LVL3 (GOWN DISPOSABLE) IMPLANT
GOWN STRL REUS W/TWL 2XL LVL3 (GOWN DISPOSABLE) ×1 IMPLANT
GOWN STRL REUS W/TWL LRG LVL3 (GOWN DISPOSABLE) ×8
GOWN STRL REUS W/TWL XL LVL3 (GOWN DISPOSABLE) ×2
GRAFT BN 10X1XDBM MAGNIFUSE (Bone Implant) IMPLANT
GRAFT BNE CHIP CANC 1-8 40 (Bone Implant) IMPLANT
GRAFT BONE MAGNIFUSE 1X10CM (Bone Implant) ×4 IMPLANT
HEMOSTAT POWDER KIT SURGIFOAM (HEMOSTASIS) ×2 IMPLANT
IV NS 1000ML (IV SOLUTION) ×2
IV NS 1000ML BAXH (IV SOLUTION) IMPLANT
IV SODIUM CHL 0.9% 250ML (IV SOLUTION) ×2 IMPLANT
KIT BASIN OR (CUSTOM PROCEDURE TRAY) ×2 IMPLANT
KIT DRSG PREVENA PLUS 7DAY 125 (MISCELLANEOUS) ×1 IMPLANT
KIT INFUSE LRG II (Orthopedic Implant) ×1 IMPLANT
KIT POSITION SURG JACKSON T1 (MISCELLANEOUS) ×2 IMPLANT
KIT ROOM TURNOVER OR (KITS) ×2 IMPLANT
KIT SPINE MAZOR X ROBO DISP (MISCELLANEOUS) ×2 IMPLANT
MILL MEDIUM DISP (BLADE) ×2 IMPLANT
NDL HYPO 18GX1.5 BLUNT FILL (NEEDLE) ×1 IMPLANT
NDL HYPO 21X1.5 SAFETY (NEEDLE) ×2 IMPLANT
NDL HYPO 25X1 1.5 SAFETY (NEEDLE) ×1 IMPLANT
NDL SPNL 22GX3.5 QUINCKE BK (NEEDLE) IMPLANT
NEEDLE HYPO 18GX1.5 BLUNT FILL (NEEDLE) IMPLANT
NEEDLE HYPO 21X1.5 SAFETY (NEEDLE) ×4 IMPLANT
NEEDLE HYPO 25X1 1.5 SAFETY (NEEDLE) ×2 IMPLANT
NEEDLE SPNL 22GX3.5 QUINCKE BK (NEEDLE) ×4 IMPLANT
NS IRRIG 1000ML POUR BTL (IV SOLUTION) ×2 IMPLANT
OIL CARTRIDGE MAESTRO DRILL (MISCELLANEOUS) ×2
PACK LAMINECTOMY NEURO (CUSTOM PROCEDURE TRAY) ×2 IMPLANT
PACK UNIVERSAL I (CUSTOM PROCEDURE TRAY) ×2 IMPLANT
PAD ARMBOARD 7.5X6 YLW CONV (MISCELLANEOUS) ×6 IMPLANT
PATTIES SURGICAL .5X1.5 (GAUZE/BANDAGES/DRESSINGS) ×1 IMPLANT
PIN HEAD 2.5X60MM (PIN) IMPLANT
ROD STRT TI 5.5X300 ARSENAL (Rod) ×2 IMPLANT
RUBBERBAND STERILE (MISCELLANEOUS) IMPLANT
SCREW ARSENAL 5.5X45MM POLY (Screw) ×4 IMPLANT
SCREW POLYAXIAL 9.5X80 (Screw) ×1 IMPLANT
SCREW SCHANZ SA 4.0MM (MISCELLANEOUS) ×1 IMPLANT
SCREW SET SPINAL ARSENAL 47127 (Screw) ×20 IMPLANT
SEALER BIPOLAR AQUA 2.3 (INSTRUMENTS) ×2 IMPLANT
SPONGE LAP 4X18 X RAY DECT (DISPOSABLE) ×1 IMPLANT
SPONGE NEURO XRAY DETECT 1X3 (DISPOSABLE) ×1 IMPLANT
SPONGE SURGIFOAM ABS GEL 100 (HEMOSTASIS) ×1 IMPLANT
STAPLER VISISTAT 35W (STAPLE) ×1 IMPLANT
STRIP SURGICAL 1 X 6 IN (GAUZE/BANDAGES/DRESSINGS) IMPLANT
STRIP SURGICAL 1/2 X 6 IN (GAUZE/BANDAGES/DRESSINGS) IMPLANT
STRIP SURGICAL 1/4 X 6 IN (GAUZE/BANDAGES/DRESSINGS) IMPLANT
STRIP SURGICAL 3/4 X 6 IN (GAUZE/BANDAGES/DRESSINGS) IMPLANT
SUT STRATAFIX MNCRL+ 3-0 PS-2 (SUTURE) ×2
SUT STRATAFIX MONOCRYL 3-0 (SUTURE) ×2
SUT VIC AB 0 CT1 18XCR BRD8 (SUTURE) ×2 IMPLANT
SUT VIC AB 0 CT1 8-18 (SUTURE)
SUT VIC AB 1 CT1 18XBRD ANBCTR (SUTURE) IMPLANT
SUT VIC AB 1 CT1 8-18 (SUTURE) ×8
SUT VIC AB 2-0 CT1 18 (SUTURE) ×8 IMPLANT
SUT VIC AB 3-0 SH 8-18 (SUTURE) ×3 IMPLANT
SUT VIC AB 4-0 PS2 27 (SUTURE) ×1 IMPLANT
SUTURE STRATFX MNCRL+ 3-0 PS-2 (SUTURE) IMPLANT
SYR 20CC LL (SYRINGE) ×3 IMPLANT
SYR 30ML LL (SYRINGE) ×4 IMPLANT
SYR 3ML LL SCALE MARK (SYRINGE) ×6 IMPLANT
SYR 5ML LL (SYRINGE) ×2 IMPLANT
TIP TROCAR NITINOL ILLICO 20 (INSTRUMENTS) ×4 IMPLANT
TOWEL GREEN STERILE (TOWEL DISPOSABLE) ×2 IMPLANT
TOWEL GREEN STERILE FF (TOWEL DISPOSABLE) ×2 IMPLANT
WATER STERILE IRR 1000ML POUR (IV SOLUTION) ×2 IMPLANT

## 2017-09-02 NOTE — Progress Notes (Signed)
Patient currently at surgery- L3 fracture. Chart reviewed. Currently on insulin sliding scale for diabetes- CBGs controlled. No further episodes of abdominal pain or vomiting.   Will continue to follow patient with you.  Stratton Villwock D.O. [facility] Pager [phone]  If 7PM-7AM, please contact night-coverage [email or web address] Password [facility] 09/02/2017, 2:43 PM

## 2017-09-02 NOTE — Brief Op Note (Signed)
08/29/2017 - 09/02/2017  12:58 PM  PATIENT:  Megan [name]  66 y.o. female  PRE-OPERATIVE DIAGNOSIS:  Lumbar three vertebral body fracture  POST-OPERATIVE DIAGNOSIS:  Lumbar three vertebral body fracture  PROCEDURE:  Procedure(s): Lumbar three-four redo laminectomy; Repositioning of Left Sacral two screw; Extension of lumbar fusion to Thoracic twelve (N/A) APPLICATION OF ROBOTIC ASSISTANCE FOR SPINAL PROCEDURE (N/A)  SURGEON:  Surgeon(s) and Role:    * Akari Defelice, Kevan Ny, MD - Primary  PHYSICIAN ASSISTANT:   ASSISTANTS: Kristeen Miss, MD  ANESTHESIA:   general  EBL:  400 mL   BLOOD ADMINISTERED:none  DRAINS: Medium hemovac drain  LOCAL MEDICATIONS USED:  MARCAINE    and LIDOCAINE   SPECIMEN:  No Specimen  DISPOSITION OF SPECIMEN:  N/A  COUNTS:  YES  TOURNIQUET:  * No tourniquets in log *  DICTATION: .Dragon Dictation  PLAN OF CARE: Admit to inpatient   PATIENT DISPOSITION:  PACU - hemodynamically stable.   Delay start of Pharmacological VTE agent (>24hrs) due to surgical blood loss or risk of bleeding: yes

## 2017-09-02 NOTE — Anesthesia Procedure Notes (Signed)
Procedure Name: Intubation Date/Time: 09/02/2017 7:44 AM Performed by: Imagene Riches, CRNA Pre-anesthesia Checklist: Patient identified, Emergency Drugs available, Suction available and Patient being monitored Patient Re-evaluated:Patient Re-evaluated prior to induction Oxygen Delivery Method: Circle System Utilized Preoxygenation: Pre-oxygenation with 100% oxygen Induction Type: IV induction Ventilation: Mask ventilation without difficulty Laryngoscope Size: Miller and 2 Grade View: Grade I Tube type: Oral Tube size: 7.5 mm Number of attempts: 1 Airway Equipment and Method: Stylet and Oral airway Placement Confirmation: ETT inserted through vocal cords under direct vision,  positive ETCO2 and breath sounds checked- equal and bilateral Secured at: 22 cm Tube secured with: Tape Dental Injury: Teeth and Oropharynx as per pre-operative assessment

## 2017-09-02 NOTE — Progress Notes (Signed)
No acute post op events Quadriceps strength 2/5 bilaterally Distal strength 4/5 Hopefully will ambulate tomorrow with therapy

## 2017-09-02 NOTE — Progress Notes (Signed)
Pharmacy Antibiotic Note  Megan [name] is a 66 y.o. female admitted on 08/29/2017 with nausea, vomiting and abdominal pain.  She has L3 vertebral body fracture s/p spinal surgery today 09/02/17.  Pharmacy has been consulted for vancomycin dosing for surgical prophylaxis in setting of a drain.  Noted that patient received pre-op vancomycin 1250mg  IV x 1 at 0800 today.  She continues on Rocephin for E.coli UTI.  Renal function is stable.  She is afebrile and her WBC is low at 3.1.   Plan: Vanc 750mg  IV Q12H for trough ~15 mcg/mL Rocephin 1gm IV Q24H per MD Monitor renal fxn, clinical progress, vanc trough as indicated F/U Rocephin LOT   Height: 5\' 8"  (172.7 cm) Weight: 160 lb (72.6 kg) IBW/kg (Calculated) : 63.9  Temp (24hrs), Avg:97.8 F (36.6 C), Min:97 F (36.1 C), Max:98.4 F (36.9 C)  Recent Labs  Lab 08/29/17 0848 09/01/17 0412  WBC 8.1 3.1*  CREATININE 0.66 0.68    Estimated Creatinine Clearance: 71.7 mL/min (by C-G formula based on SCr of 0.68 mg/dL).    Allergies  Allergen Reactions  . Shellfish Allergy Anaphylaxis, Swelling and Other (See Comments)    Tongue swells  . Oxycodone Nausea Only  . Penicillins Rash and Other (See Comments)    Has patient had a PCN reaction causing immediate rash, facial/tongue/throat swelling, SOB or lightheadedness with hypotension: Yes Has patient had a PCN reaction causing severe rash involving mucus membranes or skin necrosis: No Has patient had a PCN reaction that required hospitalization No Has patient had a PCN reaction occurring within the last 10 years: No If all of the above answers are "NO", then may proceed with Cephalosporin use.     CTX 12/18>> Vanc 12/21 >>   12/17 urine - E coli ( R Amp, I to Unasyn, S others)   Megan [name] D. Mina Marble, PharmD, BCPS Pager:  [phone] 09/02/2017, 7:13 PM

## 2017-09-02 NOTE — Progress Notes (Signed)
No acute overnight events Neurologically unchanged Ready for OR All questions answered

## 2017-09-02 NOTE — Anesthesia Preprocedure Evaluation (Addendum)
Anesthesia Evaluation  Patient identified by MRN, date of birth, ID band Patient awake    Reviewed: Allergy & Precautions, NPO status , Patient's Chart, lab work & pertinent test results  History of Anesthesia Complications (+) PONV and history of anesthetic complications  Airway Mallampati: II  TM Distance: >3 FB Neck ROM: Full    Dental  (+) Partial Upper, Partial Lower, Missing,    Pulmonary pneumonia, former smoker,    Pulmonary exam normal        Cardiovascular hypertension, (-) anginaNormal cardiovascular exam     Neuro/Psych Chronic back pain: tramadol    GI/Hepatic Neg liver ROS, GERD  ,  Endo/Other  negative endocrine ROSdiabetes  Renal/GU negative Renal ROS     Musculoskeletal  (+) Arthritis ,   Abdominal   Peds  Hematology negative hematology ROS (+)   Anesthesia Other Findings   Reproductive/Obstetrics                            Anesthesia Physical  Anesthesia Plan  ASA: II  Anesthesia Plan: General   Post-op Pain Management:    Induction: Intravenous  PONV Risk Score and Plan: 4 or greater and Ondansetron, Dexamethasone, Scopolamine patch - Pre-op and Diphenhydramine  Airway Management Planned: Oral ETT  Additional Equipment:   Intra-op Plan:   Post-operative Plan: Extubation in OR  Informed Consent: I have reviewed the patients History and Physical, chart, labs and discussed the procedure including the risks, benefits and alternatives for the proposed anesthesia with the patient or authorized representative who has indicated his/her understanding and acceptance.   Dental advisory given  Plan Discussed with: CRNA and Anesthesiologist  Anesthesia Plan Comments: (Ketamine infusion intra-op.)      Anesthesia Quick Evaluation

## 2017-09-02 NOTE — Anesthesia Postprocedure Evaluation (Signed)
Anesthesia Post Note  Patient: CARLE DARGAN  Procedure(s) Performed: Lumbar three-four redo laminectomy; Repositioning of Left Sacral two screw; Extension of lumbar fusion to Thoracic twelve (N/A ) APPLICATION OF ROBOTIC ASSISTANCE FOR SPINAL PROCEDURE (N/A )     Patient location during evaluation: PACU Anesthesia Type: General Level of consciousness: sedated Pain management: pain level controlled Vital Signs Assessment: post-procedure vital signs reviewed and stable Respiratory status: spontaneous breathing and respiratory function stable Cardiovascular status: stable Postop Assessment: no apparent nausea or vomiting Anesthetic complications: no    Last Vitals:  Vitals:   09/02/17 1400 09/02/17 1413  BP: 137/76   Pulse: 90 83  Resp: 15 14  Temp:  (!) 36.1 C  SpO2: 100% 100%    Last Pain:  Vitals:   09/02/17 0503  TempSrc: Oral  PainSc:                  Lopaka Karge DANIEL

## 2017-09-02 NOTE — Transfer of Care (Signed)
Immediate Anesthesia Transfer of Care Note  Patient: Megan [name]  Procedure(s) Performed: Lumbar three-four redo laminectomy; Repositioning of Left Sacral two screw; Extension of lumbar fusion to Thoracic twelve (N/A ) APPLICATION OF ROBOTIC ASSISTANCE FOR SPINAL PROCEDURE (N/A )  Patient Location: PACU  Anesthesia Type:General  Level of Consciousness: oriented and drowsy  Airway & Oxygen Therapy: Patient Spontanous Breathing and Patient connected to nasal cannula oxygen  Post-op Assessment: Report given to RN and Post -op Vital signs reviewed and stable  Post vital signs: Reviewed and stable  Last Vitals:  Vitals:   09/02/17 0121 09/02/17 0503  BP: (!) 116/56 (!) 152/60  Pulse: 60 71  Resp: 18 18  Temp: 36.9 C 36.8 C  SpO2: 98% 100%    Last Pain:  Vitals:   09/02/17 0503  TempSrc: Oral  PainSc:       Patients Stated Pain Goal: 3 (69/79/48 0165)  Complications: No apparent anesthesia complications

## 2017-09-03 DIAGNOSIS — G894 Chronic pain syndrome: Secondary | ICD-10-CM

## 2017-09-03 DIAGNOSIS — E876 Hypokalemia: Secondary | ICD-10-CM

## 2017-09-03 DIAGNOSIS — D62 Acute posthemorrhagic anemia: Secondary | ICD-10-CM

## 2017-09-03 DIAGNOSIS — N390 Urinary tract infection, site not specified: Secondary | ICD-10-CM

## 2017-09-03 DIAGNOSIS — G8918 Other acute postprocedural pain: Secondary | ICD-10-CM

## 2017-09-03 DIAGNOSIS — R110 Nausea: Secondary | ICD-10-CM

## 2017-09-03 DIAGNOSIS — N319 Neuromuscular dysfunction of bladder, unspecified: Secondary | ICD-10-CM

## 2017-09-03 DIAGNOSIS — S32039D Unspecified fracture of third lumbar vertebra, subsequent encounter for fracture with routine healing: Secondary | ICD-10-CM

## 2017-09-03 DIAGNOSIS — Z01811 Encounter for preprocedural respiratory examination: Secondary | ICD-10-CM

## 2017-09-03 DIAGNOSIS — Z419 Encounter for procedure for purposes other than remedying health state, unspecified: Secondary | ICD-10-CM

## 2017-09-03 DIAGNOSIS — E119 Type 2 diabetes mellitus without complications: Secondary | ICD-10-CM

## 2017-09-03 DIAGNOSIS — I10 Essential (primary) hypertension: Secondary | ICD-10-CM

## 2017-09-03 DIAGNOSIS — D638 Anemia in other chronic diseases classified elsewhere: Secondary | ICD-10-CM

## 2017-09-03 LAB — CBC
HCT: 21.7 % — ABNORMAL LOW (ref 36.0–46.0)
Hemoglobin: 7.1 g/dL — ABNORMAL LOW (ref 12.0–15.0)
MCH: 27.5 pg (ref 26.0–34.0)
MCHC: 32.7 g/dL (ref 30.0–36.0)
MCV: 84.1 fL (ref 78.0–100.0)
Platelets: 212 10*3/uL (ref 150–400)
RBC: 2.58 MIL/uL — ABNORMAL LOW (ref 3.87–5.11)
RDW: 13.8 % (ref 11.5–15.5)
WBC: 6.2 10*3/uL (ref 4.0–10.5)

## 2017-09-03 LAB — BASIC METABOLIC PANEL
Anion gap: 6 (ref 5–15)
BUN: 10 mg/dL (ref 6–20)
CALCIUM: 7.8 mg/dL — AB (ref 8.9–10.3)
CO2: 25 mmol/L (ref 22–32)
CREATININE: 0.78 mg/dL (ref 0.44–1.00)
Chloride: 106 mmol/L (ref 101–111)
GFR calc non Af Amer: >60 mL/min (ref >60)
Glucose, Bld: 190 mg/dL — ABNORMAL HIGH (ref 65–99)
Potassium: 3.8 mmol/L (ref 3.5–5.1)
SODIUM: 137 mmol/L (ref 135–145)

## 2017-09-03 LAB — GLUCOSE, CAPILLARY
GLUCOSE-CAPILLARY: 157 mg/dL — AB (ref 65–99)
GLUCOSE-CAPILLARY: 160 mg/dL — AB (ref 65–99)
Glucose-Capillary: 188 mg/dL — ABNORMAL HIGH (ref 65–99)
Glucose-Capillary: 166 mg/dL — ABNORMAL HIGH (ref 65–99)

## 2017-09-03 LAB — HEMOGLOBIN AND HEMATOCRIT, BLOOD
HCT: 23.7 % — ABNORMAL LOW (ref 36.0–46.0)
Hemoglobin: 7.6 g/dL — ABNORMAL LOW (ref 12.0–15.0)

## 2017-09-03 MED ORDER — FUROSEMIDE 10 MG/ML IJ SOLN
10.0000 mg | Freq: Once | INTRAMUSCULAR | Status: AC
  Administered 2017-09-03: 10 mg via INTRAVENOUS
  Filled 2017-09-03: qty 4

## 2017-09-03 NOTE — Evaluation (Signed)
Physical Therapy Evaluation Patient Details Name: Megan [name] MRN: [id number] DOB: December 26, 1950 Today's Date: 09/03/2017   History of Present Illness  Patient is a 66 y/o female with recent spine surgery in November (s/p L5-S1 ALIF, L5-S1 lami, fusion) presents with Nausea/vomiting and abdominal pain. Found to have L3 vertebral body fx. Now s/p L3-4 redo lami, repositioning of Left Sacral two screw and extension of fusion to T12. + UTI. PMH includes HTN, uterine ca, shingles. HLD, DM.   Clinical Impression  Patient presents with pain, decreased sensation RLE, weakness RLE>LLE, nausea, impaired balance and impaired mobility s/p above. Tolerated log roll and getting to EOB with Max A of 2 but OOB mobility limited secondary to nausea and increased pain. RN notified. Education re: reviewing back precautions, log roll technique etc. Pt only taking a few steps with spouse assist PTA and mostly transferring and using scooter for mobility. Would benefit from intensive rehab to maximize independence and mobility prior to return home. Great CIR candidate with good family support.     Follow Up Recommendations CIR    Equipment Recommendations  None recommended by PT    Recommendations for Other Services Rehab consult     Precautions / Restrictions Precautions Precautions: Back;Fall Precaution Booklet Issued: No Precaution Comments: Reviewed back precautions- able to state independently. Wound vac and hemovac Required Braces or Orthoses: Spinal Brace Spinal Brace: Lumbar corset;Applied in sitting position Restrictions Weight Bearing Restrictions: No      Mobility  Bed Mobility Overal bed mobility: Needs Assistance Bed Mobility: Rolling;Sidelying to Sit;Sit to Sidelying Rolling: Mod assist;+2 for safety/equipment Sidelying to sit: Max assist;+2 for physical assistance;HOB elevated     Sit to sidelying: Max assist;+2 for physical assistance;HOB elevated General bed mobility comments:  Step by step cues to roll towards right side, cues to use LLE to hook RLE to assist to EOB but requires assist; assist with trunk to get to EOB and to return to supine.   Transfers                 General transfer comment: Deferred secondary to nausea and pt requesting to return to supine.  Ambulation/Gait                Stairs            Wheelchair Mobility    Modified Rankin (Stroke Patients Only)       Balance Overall balance assessment: Needs assistance Sitting-balance support: Feet supported;Bilateral upper extremity supported Sitting balance-Leahy Scale: Zero Sitting balance - Comments: Requires total A to maintain sitting balance with support of BUEs. Limited due to nausea and pain. RN notified.                                      Pertinent Vitals/Pain Pain Assessment: 0-10 Pain Score: 2 (10 post mobility) Pain Location: back at surgical site Pain Descriptors / Indicators: Operative site guarding;Sore Pain Intervention(s): Monitored during session;Repositioned;Premedicated before session;Limited activity within patient's tolerance    Home Living Family/patient expects to be discharged to:: Inpatient rehab Living Arrangements: Spouse/significant other Available Help at Discharge: Family;Available 24 hours/day Type of Home: House Home Access: Ramped entrance     Home Layout: One level Home Equipment: Clinical cytogeneticist - 2 wheels;Cane - single point;Walker - 4 wheels;Grab bars - tub/shower Additional Comments: Spouse works but runs his own company and can come and go as needed.  Prior Function Level of Independence: Needs assistance   Gait / Transfers Assistance Needed: Pt recently d/ced from CIR on Dec 7 and only walking 7 feet with w/c follow. Has not walked since being home. Transfers with assist.   ADL's / Homemaking Assistance Needed: Husband assists with ADLs and does IADLs.   Comments: Has scooter for mobility.       Hand Dominance   Dominant Hand: Right    Extremity/Trunk Assessment   Upper Extremity Assessment Upper Extremity Assessment: Defer to OT evaluation    Lower Extremity Assessment Lower Extremity Assessment: RLE deficits/detail;LLE deficits/detail RLE Deficits / Details: Ankle AROM WFL; Limited knee flexion/extension and hip flexion secondary to weakness. RLE Sensation: decreased light touch(Numbness throughout RLE, worse in thigh, better on lateral aspect of LE) RLE Coordination: decreased fine motor;decreased gross motor LLE Deficits / Details: Grossly 3/5 throughout but not formally assessed this date. Ankle AROM WFL. LLE: Unable to fully assess due to pain LLE Sensation: [facility])    Cervical / Trunk Assessment Cervical / Trunk Assessment: Other exceptions Cervical / Trunk Exceptions: s/p spine surgery  Communication   Communication: No difficulties  Cognition Arousal/Alertness: Awake/alert Behavior During Therapy: WFL for tasks assessed/performed Overall Cognitive Status: Within Functional Limits for tasks assessed                                        General Comments General comments (skin integrity, edema, etc.): Spouse present during session.     Exercises     Assessment/Plan    PT Assessment Patient needs continued PT services  PT Problem List Decreased strength;Decreased balance;Pain;Decreased mobility;Decreased range of motion;Decreased activity tolerance;Impaired sensation;Decreased skin integrity       PT Treatment Interventions DME instruction;Functional mobility training;Balance training;Patient/family education;Gait training;Therapeutic activities;Neuromuscular re-education;Wheelchair mobility training;Therapeutic exercise    PT Goals (Current goals can be found in the Care Plan section)  Acute Rehab PT Goals Patient Stated Goal: to go back to CIR PT Goal Formulation: With patient Time For Goal Achievement: 09/17/17 Potential to  Achieve Goals: Good    Frequency Min 5X/week   Barriers to discharge        Co-evaluation PT/OT/SLP Co-Evaluation/Treatment: Yes Reason for Co-Treatment: For patient/therapist safety;To address functional/ADL transfers PT goals addressed during session: Mobility/safety with mobility;Balance         AM-PAC PT "6 Clicks" Daily Activity  Outcome Measure Difficulty turning over in bed (including adjusting bedclothes, sheets and blankets)?: Unable Difficulty moving from lying on back to sitting on the side of the bed? : Unable Difficulty sitting down on and standing up from a chair with arms (e.g., wheelchair, bedside commode, etc,.)?: Unable Help needed moving to and from a bed to chair (including a wheelchair)?: Total Help needed walking in hospital room?: Total Help needed climbing 3-5 steps with a railing? : Total 6 Click Score: 6    End of Session   Activity Tolerance: Patient limited by pain(nausea) Patient left: in bed;with call bell/phone within reach;with bed alarm set;with family/visitor present;with nursing/sitter in room Nurse Communication: Mobility status;Other (comment)(nausea and need to sit EOB later) PT Visit Diagnosis: Muscle weakness (generalized) (M62.81);Unsteadiness on feet (R26.81);Pain;Other abnormalities of gait and mobility (R26.89) Pain - part of body: (back)    Time: 1610-9604 PT Time Calculation (min) (ACUTE ONLY): 21 min   Charges:   PT Evaluation $PT Eval Moderate Complexity: 1 Mod     PT G Codes:  Wray Kearns, PT, DPT [phone]    Marguarite Arbour A [place] 09/03/2017, 11:03 AM

## 2017-09-03 NOTE — Progress Notes (Signed)
Pt unwilling/unable to ambulate or dangle feet. PT aware

## 2017-09-03 NOTE — Evaluation (Signed)
Occupational Therapy Evaluation Patient Details Name: Megan [name] MRN: [id number] DOB: 05/26/1951 Today's Date: 09/03/2017    History of Present Illness Patient is a 66 y/o female with recent spine surgery in November (s/p L5-S1 ALIF, L5-S1 lami, fusion) presents with Nausea/vomiting and abdominal pain. Found to have L3 vertebral body fx. Now s/p L3-4 redo lami, repositioning of Left Sacral two screw and extension of fusion to T12. + UTI. PMH includes HTN, uterine ca, shingles. HLD, DM.    Clinical Impression   Pt presenting with pain, muscle weakness and nausea impairing her ability to participate in ADLs and functional mobility.  Pt participated in bed mobility and EOB sitting with +2 total max assist but was limited by pain and nausea. Pt requiring assist with ADLs at home, spouse assisting. She reports she was not completing toilet transfers but using depends. Pt would benefit from intensive rehab to maximize safety and independence with ADLs prior to eventual return home. Will continue to follow acutely.    Follow Up Recommendations  CIR    Equipment Recommendations  (TBD)    Recommendations for Other Services Rehab consult     Precautions / Restrictions Precautions Precautions: Back;Fall Precaution Booklet Issued: No Precaution Comments: Reviewed back precautions- able to state independently. Wound vac and hemovac Required Braces or Orthoses: Spinal Brace Spinal Brace: Lumbar corset;Applied in sitting position Restrictions Weight Bearing Restrictions: No      Mobility Bed Mobility Overal bed mobility: Needs Assistance Bed Mobility: Rolling;Sidelying to Sit;Sit to Sidelying Rolling: Mod assist;+2 for safety/equipment Sidelying to sit: Max assist;+2 for physical assistance;HOB elevated     Sit to sidelying: Max assist;+2 for physical assistance;HOB elevated General bed mobility comments: Step by step cues to roll towards right side, cues to use LLE to hook RLE to  assist to EOB but requires assist; assist with trunk to get to EOB and to return to supine.   Transfers                 General transfer comment: Deferred secondary to nausea and pt requesting to return to supine.    Balance Overall balance assessment: Needs assistance Sitting-balance support: Feet supported;Bilateral upper extremity supported Sitting balance-Leahy Scale: Zero Sitting balance - Comments: Requires total A to maintain sitting balance with support of BUEs. Limited due to nausea and pain. RN notified.                                    ADL either performed or assessed with clinical judgement   ADL Overall ADL's : Needs assistance/impaired Eating/Feeding: Set up;Bed level   Grooming: Wash/dry hands;Wash/dry face;Brushing hair;Set up;Bed level   Upper Body Bathing: Moderate assistance;Bed level   Lower Body Bathing: Total assistance;Bed level   Upper Body Dressing : Bed level;Maximal assistance   Lower Body Dressing: Total assistance;Bed level                 General ADL Comments: Pt set EOB with max assist support for balance. Became very nauseous sitting EOB and unable to tolerate OOB transfer.       Vision Baseline Vision/History: Wears glasses Wears Glasses: Reading only Patient Visual Report: No change from baseline Vision Assessment?: No apparent visual deficits     Perception     Praxis      Pertinent Vitals/Pain Pain Assessment: 0-10 Pain Score: 2  Pain Location: back at surgical site Pain Descriptors / Indicators:  Operative site guarding;Sore Pain Intervention(s): Monitored during session     Hand Dominance Right   Extremity/Trunk Assessment Upper Extremity Assessment Upper Extremity Assessment: Generalized weakness   Lower Extremity Assessment Lower Extremity Assessment: Defer to PT evaluation RLE Deficits / Details: Ankle AROM WFL; Limited knee flexion/extension and hip flexion secondary to weakness. RLE  Sensation: decreased light touch(Numbness throughout RLE, worse in thigh, better on lateral aspect of LE) RLE Coordination: decreased fine motor;decreased gross motor LLE Deficits / Details: Grossly 3/5 throughout but not formally assessed this date. Ankle AROM WFL. LLE: Unable to fully assess due to pain LLE Sensation: [facility])   Cervical / Trunk Assessment Cervical / Trunk Assessment: Other exceptions Cervical / Trunk Exceptions: s/p spine surgery   Communication Communication Communication: No difficulties   Cognition Arousal/Alertness: Awake/alert Behavior During Therapy: WFL for tasks assessed/performed Overall Cognitive Status: Within Functional Limits for tasks assessed                                     General Comments  Spouse present during session.     Exercises     Shoulder Instructions      Home Living Family/patient expects to be discharged to:: Inpatient rehab Living Arrangements: Spouse/significant other Available Help at Discharge: Family;Available 24 hours/day Type of Home: House Home Access: Ramped entrance     Home Layout: One level     Bathroom Shower/Tub: Tub/shower unit;Door;Curtain   Biochemist, clinical: Standard Bathroom Accessibility: Yes How Accessible: Accessible via walker;Other (comment) Home Equipment: Shower seat;Walker - 2 wheels;Cane - single point;Walker - 4 wheels;Grab bars - tub/shower   Additional Comments: Spouse works but runs his own company and can come and go as needed.   Lives With: Spouse    Prior Functioning/Environment Level of Independence: Needs assistance  Gait / Transfers Assistance Needed: Pt recently d/ced from CIR on Dec 7 and only walking 7 feet with w/c follow. Has not walked since being home. Transfers with assist.  ADL's / Homemaking Assistance Needed: Husband assists with ADLs and does IADLs. Pt reports using depends at home and not transferring to toilet.   Comments: Has scooter for mobility.          OT Problem List: Decreased strength;Decreased activity tolerance;Impaired balance (sitting and/or standing);Decreased coordination;Decreased knowledge of use of DME or AE;Decreased knowledge of precautions;Pain      OT Treatment/Interventions: Self-care/ADL training;Therapeutic exercise;Energy conservation;DME and/or AE instruction;Therapeutic activities;Balance training;Patient/family education    OT Goals(Current goals can be found in the care plan section) Acute Rehab OT Goals Patient Stated Goal: to go back to CIR OT Goal Formulation: With patient/family Time For Goal Achievement: 09/17/17 Potential to Achieve Goals: Good  OT Frequency: Min 3X/week   Barriers to D/C:            Co-evaluation PT/OT/SLP Co-Evaluation/Treatment: Yes Reason for Co-Treatment: Complexity of the patient's impairments (multi-system involvement);To address functional/ADL transfers;For patient/therapist safety PT goals addressed during session: Mobility/safety with mobility;Balance OT goals addressed during session: Strengthening/ROM;ADL's and self-care      AM-PAC PT "6 Clicks" Daily Activity     Outcome Measure Help from another person eating meals?: A Little Help from another person taking care of personal grooming?: A Little Help from another person toileting, which includes using toliet, bedpan, or urinal?: Total Help from another person bathing (including washing, rinsing, drying)?: Total Help from another person to put on and taking off regular upper body clothing?:  A Lot Help from another person to put on and taking off regular lower body clothing?: Total 6 Click Score: 11   End of Session Nurse Communication: Mobility status(pt request nausea meds; encourage EOB sitting with RN staff)  Activity Tolerance: (limited by nausea) Patient left: in bed;with call bell/phone within reach;with nursing/sitter in room;with family/visitor present;with bed alarm set  OT Visit Diagnosis:  Unsteadiness on feet (R26.81);Other abnormalities of gait and mobility (R26.89);Muscle weakness (generalized) (M62.81);Pain Pain - part of body: (back)                Time: 8891-6945 OT Time Calculation (min): 23 min Charges:  OT General Charges $OT Visit: 1 Visit OT Evaluation $OT Eval Moderate Complexity: 1 Mod G-Codes:       Darrol Jump OTR/L 09/03/2017, 11:21 AM

## 2017-09-03 NOTE — Progress Notes (Addendum)
PROGRESS NOTE    Megan [name]  OEV:[id number] DOB: 08/20/1951 DOA: 08/29/2017 PCP: Pleas Koch, NP   Chief Complaint  Patient presents with  . Nausea  . Emesis    Brief Narrative:  Consulted for nausea, vomiting, abdominal pain. Recently underwent L3-4 redo laminectomy on 09/02/2017. Found to have anemia today, however reluctant regarding transfusion.  Assessment & Plan   Acute blood loss anemia -suspect some blood loss anemia during surgery as well as dilutional component as patient has been receiving IVF  -will discontinue IVF -will give small dose of IV lasix -repeat H/H later today -patient reluctant regarding blood transfusion -continue to monitor CBC  Nausea, vomiting, abdominal pain -possibly secondary to constipation vs UTI -patient was placed on laxative and given an enema -patient was able to have a bowel movement overnight -continue to monitor closely, antiemetics PRN -will add on H2 blocker to see if this helps her morning nausea (which states has been ongoing for quite some time)  UTI -UA: Many bacteria, large leukocytes, TNTC WBC -Urine culture >100k EColi, sensitivities pending  -Continue ceftriaxone  L3 vertebral body fracture -MRI showed fracture with update millimeters of posterior displacement/retrolisthesis. Mild narrowing of the thecal sac at this level without cord or cauda equina compression. Resultant severe bilateral L3 foraminal stenosis, right worse left. -neurosurgery primary- s/p laminectomy -PT recommended CIR -CIR consulted  Diabetes mellitus, type II -discontinued metformin and placed on ISS with CBG monitoring   Urinary retention -patient supposedly self caths at home -foley catheter ordered as patient had 562ml retained on bladder scan  GERD -continue PPI -as above added H2 blocker  Essential  Hypertension -continue lisinopril   DVT Prophylaxis  lovenox  Code Status: Full  Family Communication: Husband at  bedside  Disposition Plan: Admitted, pending improvement in Hemoglobin and possibly CIR   Consultants [facility] consult Neurosurgery consultant Inpatient rehab  Procedures  Lumbar three-four redo laminectomy; Repositioning of Left Sacral two screw; Extension of lumbar fusion to Thoracic twelve (N/A) APPLICATION OF ROBOTIC ASSISTANCE FOR SPINAL PROCEDURE (N/A)  Antibiotics   Anti-infectives (From admission, onward)   Start     Dose/Rate Route Frequency Ordered Stop   09/02/17 2000  vancomycin (VANCOCIN) IVPB 750 mg/150 ml premix     750 mg 150 mL/hr over 60 Minutes Intravenous Every 12 hours 09/02/17 1914     09/02/17 1202  vancomycin (VANCOCIN) powder  Status:  Discontinued       As needed 09/02/17 1203 09/02/17 1307   09/02/17 0724  bacitracin 50,000 Units in sodium chloride irrigation 0.9 % 500 mL irrigation  Status:  Discontinued       As needed 09/02/17 0915 09/02/17 1307   09/02/17 0715  vancomycin (VANCOCIN) 1,250 mg in sodium chloride 0.9 % 250 mL IVPB  Status:  Discontinued     1,250 mg 166.7 mL/hr over 90 Minutes Intravenous To Surgery 09/01/17 1306 09/02/17 1431   08/29/17 2300  cefTRIAXone (ROCEPHIN) 1 g in dextrose 5 % 50 mL IVPB     1 g 100 mL/hr over 30 Minutes Intravenous Every 24 hours 08/29/17 2216        Subjective:   Megan [name] seen and examined today. Feels nausea has improved. Does not want a blood transfusion if possible. Denies chest pain, shortness of breath, abdominal pain, vomiting, dizziness, headache.   Objective:   Vitals:   09/03/17 0031 09/03/17 0646 09/03/17 0940 09/03/17 1039  BP: 125/65 (!) 101/51 (!) 125/51 115/60  Pulse: 65 72  76  Resp: 18 18  18   Temp: 98.2 F (36.8 C) 98.4 F (36.9 C)  98 F (36.7 C)  TempSrc: Oral Oral  Oral  SpO2: 100% 100%  100%  Weight:      Height:        Intake/Output Summary (Last 24 hours) at 09/03/2017 1123 Last data filed at 09/03/2017 7591 Gross per 24 hour  Intake 600 ml  Output 1000 ml  Net  -400 ml   Filed Weights   08/29/17 0833  Weight: 72.6 kg (160 lb)   Exam  General: Well developed, well nourished, NAD, appears stated age  HEENT: NCAT, mucous membranes moist.   Cardiovascular: S1 S2 auscultated,RRR, no murmurs  Respiratory: Clear to auscultation bilaterally with equal chest rise  Abdomen: Soft, nontender, nondistended, + bowel sounds  MSK: drain in place  Extremities: warm dry without cyanosis clubbing or edema  Neuro: AAOx3, nonfocal  Psych: Normal affect and demeanor with intact judgement and insight  Data Reviewed: I have personally reviewed following labs and imaging studies  CBC: Recent Labs  Lab 08/29/17 0848 09/01/17 0412 09/03/17 0244  WBC 8.1 3.1* 6.2  NEUTROABS 6.8  --   --   HGB 10.6* 8.7* 7.1*  HCT 32.3* 26.7* 21.7*  MCV 85.0 86.4 84.1  PLT 222 181 638   Basic Metabolic Panel: Recent Labs  Lab 08/29/17 0848 09/01/17 0412 09/03/17 0244  NA 134* 139 137  K 3.8 3.7 3.8  CL 101 110 106  CO2 22 23 25   GLUCOSE 184* 113* 190*  BUN 10 11 10   CREATININE 0.66 0.68 0.78  CALCIUM 8.8* 8.4* 7.8*   GFR: Estimated Creatinine Clearance: 71.7 mL/min (by C-G formula based on SCr of 0.78 mg/dL). Liver Function Tests: Recent Labs  Lab 08/29/17 0848  AST 16  ALT 13*  ALKPHOS 144*  BILITOT 0.9  PROT 6.5  ALBUMIN 3.1*   Recent Labs  Lab 08/29/17 0848  LIPASE 30   No results for input(s): AMMONIA in the last 168 hours. Coagulation Profile: No results for input(s): INR, PROTIME in the last 168 hours. Cardiac Enzymes: No results for input(s): CKTOTAL, CKMB, CKMBINDEX, TROPONINI in the last 168 hours. BNP (last 3 results) No results for input(s): PROBNP in the last 8760 hours. HbA1C: No results for input(s): HGBA1C in the last 72 hours. CBG: Recent Labs  Lab 09/02/17 0601 09/02/17 1316 09/02/17 2105 09/02/17 2313 09/03/17 0621  GLUCAP 106* 196* 257* 204* 157*   Lipid Profile: No results for input(s): CHOL, HDL, LDLCALC,  TRIG, CHOLHDL, LDLDIRECT in the last 72 hours. Thyroid Function Tests: No results for input(s): TSH, T4TOTAL, FREET4, T3FREE, THYROIDAB in the last 72 hours. Anemia Panel: No results for input(s): VITAMINB12, FOLATE, FERRITIN, TIBC, IRON, RETICCTPCT in the last 72 hours. Urine analysis:    Component Value Date/Time   COLORURINE YELLOW 08/29/2017 2100   APPEARANCEUR CLOUDY (A) 08/29/2017 2100   LABSPEC 1.018 08/29/2017 2100   PHURINE 6.0 08/29/2017 2100   GLUCOSEU NEGATIVE 08/29/2017 2100   HGBUR MODERATE (A) 08/29/2017 2100   BILIRUBINUR NEGATIVE 08/29/2017 2100   BILIRUBINUR 1+ 01/31/2017 0841   KETONESUR 5 (A) 08/29/2017 2100   PROTEINUR 30 (A) 08/29/2017 2100   UROBILINOGEN 0.2 01/31/2017 0841   UROBILINOGEN 0.2 06/13/2015 0936   NITRITE NEGATIVE 08/29/2017 2100   LEUKOCYTESUR LARGE (A) 08/29/2017 2100   Sepsis Labs: @LABRCNTIP (procalcitonin:4,lacticidven:4)  ) Recent Results (from the past 240 hour(s))  Culture, Urine     Status: Abnormal   Collection Time: 08/29/17  11:30 PM  Result Value Ref Range Status   Specimen Description URINE, CATHETERIZED  Final   Special Requests Normal  Final   Culture >=100,000 COLONIES/mL ESCHERICHIA COLI (A)  Final   Report Status 09/01/2017 FINAL  Final   Organism ID, Bacteria ESCHERICHIA COLI (A)  Final      Susceptibility   Escherichia coli - MIC*    AMPICILLIN >=32 RESISTANT Resistant     CEFAZOLIN <=4 SENSITIVE Sensitive     CEFTRIAXONE <=1 SENSITIVE Sensitive     CIPROFLOXACIN <=0.25 SENSITIVE Sensitive     GENTAMICIN <=1 SENSITIVE Sensitive     IMIPENEM <=0.25 SENSITIVE Sensitive     NITROFURANTOIN <=16 SENSITIVE Sensitive     TRIMETH/SULFA <=20 SENSITIVE Sensitive     AMPICILLIN/SULBACTAM 16 INTERMEDIATE Intermediate     PIP/TAZO <=4 SENSITIVE Sensitive     Extended ESBL NEGATIVE Sensitive     * >=100,000 COLONIES/mL ESCHERICHIA COLI  Surgical pcr screen     Status: None   Collection Time: 09/02/17  3:50 AM  Result Value  Ref Range Status   MRSA, PCR NEGATIVE NEGATIVE Final   Staphylococcus aureus NEGATIVE NEGATIVE Final    Comment: (NOTE) The Xpert SA Assay (FDA approved for NASAL specimens in patients 35 years of age and older), is one component of a comprehensive surveillance program. It is not intended to diagnose infection nor to guide or monitor treatment.       Radiology Studies: Dg Lumbar Spine 2-3 Views  Result Date: 09/02/2017 CLINICAL DATA:  Revision of L3-4 laminectomy. EXAM: LUMBAR SPINE - 2-3 VIEW; DG C-ARM GT 120 MIN FLUOROSCOPY TIME:  36 seconds. COMPARISON:  Fluoroscopic images of July 26, 2017. MRI of August 30, 2017. FINDINGS: Three intraoperative fluoroscopic images were obtained of the lower thoracic and lumbar spine. Previous surgical posterior fusion of L4-5 and L5-S1 is noted. Interbody fusion of L3-4 and L4-5 is noted. Previous surgical anterior fusion of L5-S1 is noted. There has been superior extension of posterior fixation to what appears to T12. IMPRESSION: Extensive postsurgical changes as described above. Interval superior extension of fixation hardware to thoraco lumbar junction. Electronically Signed   By: Marijo Conception, M.D.   On: 09/02/2017 12:51   Dg Chest Port 1 View  Result Date: 09/02/2017 CLINICAL DATA:  Preop. EXAM: PORTABLE CHEST 1 VIEW COMPARISON:  None. FINDINGS: The cardiomediastinal contours are normal. The lungs are clear. Pulmonary vasculature is normal. No consolidation, pleural effusion, or pneumothorax. No acute osseous abnormalities are seen. Surgical hardware in the lower thoracic spine. Surgical hardware in the lower cervical spine is partially included. IMPRESSION: No acute abnormality. Electronically Signed   By: Jeb Levering M.D.   On: 09/02/2017 04:30   Dg C-arm Gt 120 Min  Result Date: 09/02/2017 CLINICAL DATA:  Revision of L3-4 laminectomy. EXAM: LUMBAR SPINE - 2-3 VIEW; DG C-ARM GT 120 MIN FLUOROSCOPY TIME:  36 seconds. COMPARISON:   Fluoroscopic images of July 26, 2017. MRI of August 30, 2017. FINDINGS: Three intraoperative fluoroscopic images were obtained of the lower thoracic and lumbar spine. Previous surgical posterior fusion of L4-5 and L5-S1 is noted. Interbody fusion of L3-4 and L4-5 is noted. Previous surgical anterior fusion of L5-S1 is noted. There has been superior extension of posterior fixation to what appears to T12. IMPRESSION: Extensive postsurgical changes as described above. Interval superior extension of fixation hardware to thoraco lumbar junction. Electronically Signed   By: Marijo Conception, M.D.   On: 09/02/2017 12:51  Scheduled Meds: . atorvastatin  20 mg Oral QPM  . bethanechol  5 mg Oral TID  . celecoxib  200 mg Oral Q12H  . docusate sodium  100 mg Oral BID  . famotidine  10 mg Oral QHS  . gabapentin  300 mg Oral TID  . insulin aspart  0-5 Units Subcutaneous QHS  . insulin aspart  0-9 Units Subcutaneous TID WC  . lisinopril  5 mg Oral Daily  . morphine  30 mg Oral Q12H  . pantoprazole  40 mg Oral Daily  . scopolamine  1 patch Transdermal Q72H  . senna  1 tablet Oral BID  . sodium chloride flush  3 mL Intravenous Q12H  . tamsulosin  0.4 mg Oral QPC supper   Continuous Infusions: . sodium chloride    . cefTRIAXone (ROCEPHIN)  IV Stopped (09/03/17 0107)  . vancomycin Stopped (09/03/17 0013)     LOS: 5 days   Time Spent in minutes   45 minutes  Megan [name] D.O. on 09/03/2017 at 11:23 AM  Between 7am to 7pm - Pager - [phone]  After 7pm go to [email or web address] - password [email or web address]  And look for the night coverage person covering for me after hours  [facility] Office  [phone]

## 2017-09-03 NOTE — Progress Notes (Signed)
Postop day 1.  Pain well controlled.  No new lower extremity symptoms.  Has not been out of bed with therapy yet.  Drain output minimal.  Afebrile.  Vital signs are stable.  She is awake and alert.  She is oriented and appropriate.  She still has bilateral proximal lower extremity weakness right somewhat greater than left.  Her wound is clean and dry.  Her abdomen is soft.  Progressing recently well following recent revision lumbar fusion.  Continue efforts at mobilization.  Patient would likely benefit from inpatient rehabilitation prior to discharge home.

## 2017-09-03 NOTE — Progress Notes (Signed)
Patient advised that she does not feel safe getting out of the bed to stand or to walk until her husband bring her shoes.  RN educated patient on the importance of early mobilization.  Patient verbalized understanding

## 2017-09-03 NOTE — Progress Notes (Signed)
Pt bp 125/51. MD notified, said to hold bp meds.

## 2017-09-04 DIAGNOSIS — N390 Urinary tract infection, site not specified: Secondary | ICD-10-CM

## 2017-09-04 DIAGNOSIS — S32039A Unspecified fracture of third lumbar vertebra, initial encounter for closed fracture: Secondary | ICD-10-CM

## 2017-09-04 LAB — BASIC METABOLIC PANEL
ANION GAP: 6 (ref 5–15)
BUN: 7 mg/dL (ref 6–20)
CALCIUM: 7.9 mg/dL — AB (ref 8.9–10.3)
CO2: 24 mmol/L (ref 22–32)
Chloride: 108 mmol/L (ref 101–111)
Creatinine, Ser: 0.58 mg/dL (ref 0.44–1.00)
GFR calc non Af Amer: >60 mL/min (ref >60)
Glucose, Bld: 126 mg/dL — ABNORMAL HIGH (ref 65–99)
Potassium: 3.2 mmol/L — ABNORMAL LOW (ref 3.5–5.1)
Sodium: 138 mmol/L (ref 135–145)

## 2017-09-04 LAB — GLUCOSE, CAPILLARY
GLUCOSE-CAPILLARY: 178 mg/dL — AB (ref 65–99)
GLUCOSE-CAPILLARY: 231 mg/dL — AB (ref 65–99)
Glucose-Capillary: 117 mg/dL — ABNORMAL HIGH (ref 65–99)
Glucose-Capillary: 170 mg/dL — ABNORMAL HIGH (ref 65–99)
Glucose-Capillary: 128 mg/dL — ABNORMAL HIGH (ref 65–99)

## 2017-09-04 LAB — CBC
HEMATOCRIT: 22.3 % — AB (ref 36.0–46.0)
HEMOGLOBIN: 7.1 g/dL — AB (ref 12.0–15.0)
MCH: 27.2 pg (ref 26.0–34.0)
MCHC: 31.8 g/dL (ref 30.0–36.0)
MCV: 85.4 fL (ref 78.0–100.0)
Platelets: 217 10*3/uL (ref 150–400)
RBC: 2.61 MIL/uL — ABNORMAL LOW (ref 3.87–5.11)
RDW: 14.4 % (ref 11.5–15.5)
WBC: 5.2 10*3/uL (ref 4.0–10.5)

## 2017-09-04 LAB — RETICULOCYTES
RBC.: 2.62 MIL/uL — AB (ref 3.87–5.11)
RETIC COUNT ABSOLUTE: 52.4 10*3/uL (ref 19.0–186.0)
Retic Ct Pct: 2 % (ref 0.4–3.1)

## 2017-09-04 LAB — IRON AND TIBC
Iron: 10 ug/dL — ABNORMAL LOW (ref 28–170)
SATURATION RATIOS: 4 % — AB (ref 10.4–31.8)
TIBC: 232 ug/dL — ABNORMAL LOW (ref 250–450)
UIBC: 222 ug/dL

## 2017-09-04 LAB — FOLATE: FOLATE: 8.8 ng/mL (ref >5.9)

## 2017-09-04 LAB — FERRITIN: Ferritin: 65 ng/mL (ref 11–307)

## 2017-09-04 NOTE — Progress Notes (Signed)
Pt dangled feet for 1 hour and used brace. Pt refused standing with RN. Stood for 20 seconds with PT.

## 2017-09-04 NOTE — Progress Notes (Addendum)
PROGRESS NOTE    Megan [name]   ELF:[id number]  DOB: 1951/07/26  DOA: 08/29/2017 PCP: Pleas Koch, NP   Brief Narrative:  Megan [name] is an 66 y.o. female with past medical history significant for Hypertension, diabetes mellitus and recent spinal procedure involving L5-S1 (L5-S1 ALIF).  She = presents with 3 day history of nausea, vomiting, abdominal pain and frequent bowel movement.  CT abdomen and pelvis  revealed L3 body fracture with compression and posterior displacement. Has had trouble with her right leg with weakness and numbness. NS took to OR on 12/21 for redo laminectomy and lumbar fusion.  [facility] consulted on 12/17 for nausea, vomiting and abdominal pain.  Subjective: No complaints of nausea, vomiting, constipation diarrhea, cough, dyspnea or dysuria. No other complaints.   Assessment & Plan:   Principal Problem:   L3 vertebral fracture  - management per primary team  Active Problems:   Type 2 diabetes mellitus without complication, without long-term current use of insulin  -ISS in hospital    Essential hypertension - Lisinopril    Neurogenic bladder   E coli UTI - on day 7 of Rocephin to which E coli is sensitive - would d/c Rocephin after today's dose - need to continue caths TID as she dose at home- have ordered this - on Bethanecol and Flomax as well  Nausea, vomiting abdominal pain - ? Constipation and or due to UTI - has resolved- normal BMx after laxatives/ enemas - cont H2 blocker as well which was started this admission   DVT prophylaxis: SCDs Code Status: Full code  Family Communication: husband Disposition Plan: per primary Antimicrobials:  Anti-infectives (From admission, onward)   Start     Dose/Rate Route Frequency Ordered Stop   09/02/17 2000  vancomycin (VANCOCIN) IVPB 750 mg/150 ml premix     750 mg 150 mL/hr over 60 Minutes Intravenous Every 12 hours 09/02/17 1914     09/02/17 1202   vancomycin (VANCOCIN) powder  Status:  Discontinued       As needed 09/02/17 1203 09/02/17 1307   09/02/17 0724  bacitracin 50,000 Units in sodium chloride irrigation 0.9 % 500 mL irrigation  Status:  Discontinued       As needed 09/02/17 0915 09/02/17 1307   09/02/17 0715  vancomycin (VANCOCIN) 1,250 mg in sodium chloride 0.9 % 250 mL IVPB  Status:  Discontinued     1,250 mg 166.7 mL/hr over 90 Minutes Intravenous To Surgery 09/01/17 1306 09/02/17 1431   08/29/17 2300  cefTRIAXone (ROCEPHIN) 1 g in dextrose 5 % 50 mL IVPB     1 g 100 mL/hr over 30 Minutes Intravenous Every 24 hours 08/29/17 2216         Objective: Vitals:   09/04/17 0026 09/04/17 0449 09/04/17 1018 09/04/17 1435  BP: (!) 115/42 (!) 101/49 (!) 129/58 132/60  Pulse: 76 75 88 80  Resp: 20 20 16 18   Temp: 98.4 F (36.9 C) 98.2 F (36.8 C) 98.4 F (36.9 C) 98 F (36.7 C)  TempSrc: Oral Oral Oral Oral  SpO2: 100% 100% 97% 98%  Weight:      Height:        Intake/Output Summary (Last 24 hours) at 09/04/2017 1701 Last data filed at 09/04/2017 0800 Gross per 24 hour  Intake 480 ml  Output 50 ml  Net 430 ml   Filed Weights   08/29/17 0833  Weight: 72.6 kg (160 lb)    Examination: General exam:  Appears comfortable  HEENT: PERRLA, oral mucosa moist, no sclera icterus or thrush Respiratory system: Clear to auscultation. Respiratory effort normal. Cardiovascular system: S1 & S2 heard, RRR.  No murmurs  Gastrointestinal system: Abdomen soft, non-tender, nondistended. Normal bowel sound. No organomegaly Extremities: No cyanosis, clubbing or edema Skin: No rashes or ulcers Psychiatry:  Mood & affect appropriate.     Data Reviewed: I have personally reviewed following labs and imaging studies  CBC: Recent Labs  Lab 08/29/17 0848 09/01/17 0412 09/03/17 0244 09/03/17 1129 09/04/17 0611  WBC 8.1 3.1* 6.2  --  5.2  NEUTROABS 6.8  --   --   --   --   HGB 10.6* 8.7* 7.1* 7.6* 7.1*  HCT 32.3* 26.7* 21.7*  23.7* 22.3*  MCV 85.0 86.4 84.1  --  85.4  PLT 222 181 212  --  371   Basic Metabolic Panel: Recent Labs  Lab 08/29/17 0848 09/01/17 0412 09/03/17 0244 09/04/17 0611  NA 134* 139 137 138  K 3.8 3.7 3.8 3.2*  CL 101 110 106 108  CO2 22 23 25 24   GLUCOSE 184* 113* 190* 126*  BUN 10 11 10 7   CREATININE 0.66 0.68 0.78 0.58  CALCIUM 8.8* 8.4* 7.8* 7.9*   GFR: Estimated Creatinine Clearance: 71.7 mL/min (by C-G formula based on SCr of 0.58 mg/dL). Liver Function Tests: Recent Labs  Lab 08/29/17 0848  AST 16  ALT 13*  ALKPHOS 144*  BILITOT 0.9  PROT 6.5  ALBUMIN 3.1*   Recent Labs  Lab 08/29/17 0848  LIPASE 30   No results for input(s): AMMONIA in the last 168 hours. Coagulation Profile: No results for input(s): INR, PROTIME in the last 168 hours. Cardiac Enzymes: No results for input(s): CKTOTAL, CKMB, CKMBINDEX, TROPONINI in the last 168 hours. BNP (last 3 results) No results for input(s): PROBNP in the last 8760 hours. HbA1C: No results for input(s): HGBA1C in the last 72 hours. CBG: Recent Labs  Lab 09/03/17 1833 09/03/17 2131 09/04/17 0611 09/04/17 1125 09/04/17 1325  GLUCAP 188* 166* 117* 170* 178*   Lipid Profile: No results for input(s): CHOL, HDL, LDLCALC, TRIG, CHOLHDL, LDLDIRECT in the last 72 hours. Thyroid Function Tests: No results for input(s): TSH, T4TOTAL, FREET4, T3FREE, THYROIDAB in the last 72 hours. Anemia Panel: Recent Labs    09/04/17 1148  FOLATE 8.8  FERRITIN 65  TIBC 232*  IRON 10*  RETICCTPCT 2.0   Urine analysis:    Component Value Date/Time   COLORURINE YELLOW 08/29/2017 2100   APPEARANCEUR CLOUDY (A) 08/29/2017 2100   LABSPEC 1.018 08/29/2017 2100   PHURINE 6.0 08/29/2017 2100   GLUCOSEU NEGATIVE 08/29/2017 2100   HGBUR MODERATE (A) 08/29/2017 2100   BILIRUBINUR NEGATIVE 08/29/2017 2100   BILIRUBINUR 1+ 01/31/2017 0841   KETONESUR 5 (A) 08/29/2017 2100   PROTEINUR 30 (A) 08/29/2017 2100   UROBILINOGEN 0.2  01/31/2017 0841   UROBILINOGEN 0.2 06/13/2015 0936   NITRITE NEGATIVE 08/29/2017 2100   LEUKOCYTESUR LARGE (A) 08/29/2017 2100   Sepsis Labs: @LABRCNTIP (procalcitonin:4,lacticidven:4) ) Recent Results (from the past 240 hour(s))  Culture, Urine     Status: Abnormal   Collection Time: 08/29/17 11:30 PM  Result Value Ref Range Status   Specimen Description URINE, CATHETERIZED  Final   Special Requests Normal  Final   Culture >=100,000 COLONIES/mL ESCHERICHIA COLI (A)  Final   Report Status 09/01/2017 FINAL  Final   Organism ID, Bacteria ESCHERICHIA COLI (A)  Final      Susceptibility  Escherichia coli - MIC*    AMPICILLIN >=32 RESISTANT Resistant     CEFAZOLIN <=4 SENSITIVE Sensitive     CEFTRIAXONE <=1 SENSITIVE Sensitive     CIPROFLOXACIN <=0.25 SENSITIVE Sensitive     GENTAMICIN <=1 SENSITIVE Sensitive     IMIPENEM <=0.25 SENSITIVE Sensitive     NITROFURANTOIN <=16 SENSITIVE Sensitive     TRIMETH/SULFA <=20 SENSITIVE Sensitive     AMPICILLIN/SULBACTAM 16 INTERMEDIATE Intermediate     PIP/TAZO <=4 SENSITIVE Sensitive     Extended ESBL NEGATIVE Sensitive     * >=100,000 COLONIES/mL ESCHERICHIA COLI  Surgical pcr screen     Status: None   Collection Time: 09/02/17  3:50 AM  Result Value Ref Range Status   MRSA, PCR NEGATIVE NEGATIVE Final   Staphylococcus aureus NEGATIVE NEGATIVE Final    Comment: (NOTE) The Xpert SA Assay (FDA approved for NASAL specimens in patients 25 years of age and older), is one component of a comprehensive surveillance program. It is not intended to diagnose infection nor to guide or monitor treatment.          Radiology Studies: No results found.    Scheduled Meds: . atorvastatin  20 mg Oral QPM  . bethanechol  5 mg Oral TID  . celecoxib  200 mg Oral Q12H  . docusate sodium  100 mg Oral BID  . famotidine  10 mg Oral QHS  . gabapentin  300 mg Oral TID  . insulin aspart  0-5 Units Subcutaneous QHS  . insulin aspart  0-9 Units  Subcutaneous TID WC  . lisinopril  5 mg Oral Daily  . morphine  30 mg Oral Q12H  . pantoprazole  40 mg Oral Daily  . scopolamine  1 patch Transdermal Q72H  . senna  1 tablet Oral BID  . sodium chloride flush  3 mL Intravenous Q12H  . tamsulosin  0.4 mg Oral QPC supper   Continuous Infusions: . sodium chloride    . cefTRIAXone (ROCEPHIN)  IV Stopped (09/03/17 2220)  . vancomycin 750 mg (09/04/17 1521)     LOS: 6 days    Time spent in minutes: 35    Debbe Odea, MD [facility] Pager: [email or web address] Password [email or web address] 09/04/2017, 5:01 PM

## 2017-09-04 NOTE — Progress Notes (Signed)
Physical Therapy Treatment Patient Details Name: Megan [name] MRN: [id number] DOB: 15-Oct-1950 Today's Date: 09/04/2017    History of Present Illness Patient is a 66 y/o female with recent spine surgery in November (s/p L5-S1 ALIF, L5-S1 lami, fusion) presents with Nausea/vomiting and abdominal pain. Found to have L3 vertebral body fx. Now s/p L3-4 redo lami, repositioning of Left Sacral two screw and extension of fusion to T12. + UTI. PMH includes HTN, uterine ca, shingles. HLD, DM.     PT Comments    Patient is making gradual progress toward mobility goals. Pt required mod/max A +2 for bed mobility and max A +2 for sit to stands X2. Pt was able to stand briefly. R LE weakness and knee buckling if not supported in standing. Husband present during session. Continue to progress as tolerated.    Follow Up Recommendations  CIR     Equipment Recommendations  None recommended by PT    Recommendations for Other Services Rehab consult     Precautions / Restrictions Precautions Precautions: Back;Fall Precaution Comments: Wound vac and hemovac Required Braces or Orthoses: Spinal Brace Spinal Brace: Lumbar corset;Applied in sitting position Other Brace/Splint: AFO RLE Restrictions Weight Bearing Restrictions: No    Mobility  Bed Mobility Overal bed mobility: Needs Assistance Bed Mobility: Rolling;Sidelying to Sit;Sit to Sidelying Rolling: Mod assist Sidelying to sit: Max assist;+2 for physical assistance;HOB elevated     Sit to sidelying: +2 for physical assistance;Mod assist General bed mobility comments: cues for sequencing and technique; assist to bring R LE to EOB and to elevate trunk into sitting; assist to bring bilat LE and lower trunk when returning to supine  Transfers Overall transfer level: Needs assistance Equipment used: Rolling walker (2 wheeled) Transfers: Sit to/from Stand Sit to Stand: Max assist;+2 physical assistance;From elevated surface          General transfer comment: cues for safe hand placement and positioning prior to stand; pt was able to stand for brief periods no longer than 30 seconds with max A +2 to power up and maintain balance; R LE weakness and R knee buckling if not assisted to brace; lateral scoot attempt to recliner deferred as pt began feeling like she would "pass out"   Ambulation/Gait                 Stairs            Wheelchair Mobility    Modified Rankin (Stroke Patients Only)       Balance Overall balance assessment: Needs assistance Sitting-balance support: Feet supported;Bilateral upper extremity supported Sitting balance-Leahy Scale: Fair Sitting balance - Comments: pt able to maintain sitting balance EOB and able to don brace without UE support                                    Cognition Arousal/Alertness: Awake/alert Behavior During Therapy: WFL for tasks assessed/performed Overall Cognitive Status: Within Functional Limits for tasks assessed                                        Exercises      General Comments General comments (skin integrity, edema, etc.): spouse present during session      Pertinent Vitals/Pain Pain Assessment: Faces Faces Pain Scale: Hurts even more Pain Location: back at surgical site Pain Descriptors /  Indicators: Operative site guarding;Sore Pain Intervention(s): Limited activity within patient's tolerance;Monitored during session;Premedicated before session;Repositioned    Home Living                      Prior Function            PT Goals (current goals can now be found in the care plan section) Acute Rehab PT Goals PT Goal Formulation: With patient Time For Goal Achievement: 09/17/17 Potential to Achieve Goals: Good Progress towards PT goals: Progressing toward goals    Frequency    Min 5X/week      PT Plan Current plan remains appropriate    Co-evaluation               AM-PAC PT "6 Clicks" Daily Activity  Outcome Measure  Difficulty turning over in bed (including adjusting bedclothes, sheets and blankets)?: Unable Difficulty moving from lying on back to sitting on the side of the bed? : Unable Difficulty sitting down on and standing up from a chair with arms (e.g., wheelchair, bedside commode, etc,.)?: Unable Help needed moving to and from a bed to chair (including a wheelchair)?: Total Help needed walking in hospital room?: Total Help needed climbing 3-5 steps with a railing? : Total 6 Click Score: 6    End of Session Equipment Utilized During Treatment: Gait belt;Back brace Activity Tolerance: Patient limited by pain;Patient limited by fatigue Patient left: in bed;with call bell/phone within reach;with family/visitor present Nurse Communication: Mobility status PT Visit Diagnosis: Muscle weakness (generalized) (M62.81);Unsteadiness on feet (R26.81);Pain;Other abnormalities of gait and mobility (R26.89) Pain - part of body: (back)     Time: 8325-4982 PT Time Calculation (min) (ACUTE ONLY): 36 min  Charges:  $Therapeutic Activity: 23-37 mins                    G Codes:       Earney Navy, PTA Pager: [phone]     Darliss Cheney 09/04/2017, 10:24 AM

## 2017-09-04 NOTE — Progress Notes (Signed)
Vitals:   09/03/17 2100 09/04/17 0026 09/04/17 0449 09/04/17 1018  BP: (!) 124/56 (!) 115/42 (!) 101/49 (!) 129/58  Pulse: 77 76 75 88  Resp: 20 20 20 16   Temp: 98.8 F (37.1 C) 98.4 F (36.9 C) 98.2 F (36.8 C) 98.4 F (36.9 C)  TempSrc: Oral Oral Oral Oral  SpO2: 100% 100% 100% 97%  Weight:      Height:        CBC Recent Labs    09/03/17 0244 09/03/17 1129 09/04/17 0611  WBC 6.2  --  5.2  HGB 7.1* 7.6* 7.1*  HCT 21.7* 23.7* 22.3*  PLT 212  --  217   BMET Recent Labs    09/03/17 0244 09/04/17 0611  NA 137 138  K 3.8 3.2*  CL 106 108  CO2 25 24  GLUCOSE 190* 126*  BUN 10 7  CREATININE 0.78 0.58  CALCIUM 7.8* 7.9*    Patient resting comfortably in bed. Husband at bedside. Working with PT and OT. Patient says that Dr. Posey Pronto from [facility] stopped by yesterday. Recommendation has been made for CIR. Continuing therapies.  Plan: CIR when approved by insurance. So far doing well following surgery.  Hosie Spangle, MD 09/04/2017, 11:46 AM

## 2017-09-05 DIAGNOSIS — D638 Anemia in other chronic diseases classified elsewhere: Secondary | ICD-10-CM

## 2017-09-05 DIAGNOSIS — E876 Hypokalemia: Secondary | ICD-10-CM

## 2017-09-05 DIAGNOSIS — G894 Chronic pain syndrome: Secondary | ICD-10-CM

## 2017-09-05 LAB — GLUCOSE, CAPILLARY
GLUCOSE-CAPILLARY: 219 mg/dL — AB (ref 65–99)
GLUCOSE-CAPILLARY: 132 mg/dL — AB (ref 65–99)
GLUCOSE-CAPILLARY: 184 mg/dL — AB (ref 65–99)
Glucose-Capillary: 151 mg/dL — ABNORMAL HIGH (ref 65–99)

## 2017-09-05 LAB — VITAMIN B12: Vitamin B-12: 326 pg/mL (ref 180–914)

## 2017-09-05 LAB — VANCOMYCIN, TROUGH: VANCOMYCIN TR: 13 ug/mL — AB (ref 15–20)

## 2017-09-05 MED ORDER — BACLOFEN 10 MG PO TABS
10.0000 mg | ORAL_TABLET | Freq: Three times a day (TID) | ORAL | Status: DC
  Administered 2017-09-05 – 2017-09-07 (×8): 10 mg via ORAL
  Filled 2017-09-05 (×8): qty 1

## 2017-09-05 MED FILL — Sodium Chloride IV Soln 0.9%: INTRAVENOUS | Qty: 1000 | Status: AC

## 2017-09-05 MED FILL — Heparin Sodium (Porcine) Inj 1000 Unit/ML: INTRAMUSCULAR | Qty: 30 | Status: AC

## 2017-09-05 NOTE — Progress Notes (Addendum)
PROGRESS NOTE    Megan [name]   JIR:[id number]  DOB: [birth year]  DOA: 08/29/2017 PCP: Pleas Koch, NP   Brief Narrative:  Megan [name] is an 66 y.o. female with past medical history significant for Hypertension, diabetes mellitus and recent spinal procedure involving L5-S1 (L5-S1 ALIF).  She = presents with 3 day history of nausea, vomiting, abdominal pain and frequent bowel movement.  CT abdomen and pelvis  revealed L3 body fracture with compression and posterior displacement. Has had trouble with her right leg with weakness and numbness. NS took to OR on 12/21 for redo laminectomy and lumbar fusion.  [facility] consulted on 12/17 for nausea, vomiting and abdominal pain.  Subjective: No complaints today.   Assessment & Plan:   Principal Problem:   L3 vertebral fracture  - management per primary team  Active Problems:   Type 2 diabetes mellitus without complication, without long-term current use of insulin  -ISS in hospital    Essential hypertension - Lisinopril    Neurogenic bladder   E coli UTI - completed 7 of Rocephin on 12/24 to which E coli is sensitive - need to continue caths TID as she dose at home- have ordered this - on Bethanecol and Flomax as well  Nausea, vomiting abdominal pain - ? Constipation and or due to UTI - has resolved- normal BMx after laxatives/ enemas - cont H2 blocker as well which was started this admission  Anemia - anemia panel reveals low Iron saturation but normal Ferretin. Iron binding has not increased.    DVT prophylaxis: SCDs Code Status: Full code  Family Communication: husband Disposition Plan: per primary Antimicrobials:  Anti-infectives (From admission, onward)   Start     Dose/Rate Route Frequency Ordered Stop   09/02/17 2000  vancomycin (VANCOCIN) IVPB 750 mg/150 ml premix     750 mg 150 mL/hr over 60 Minutes Intravenous Every 12 hours 09/02/17 1914     09/02/17 1202  vancomycin  (VANCOCIN) powder  Status:  Discontinued       As needed 09/02/17 1203 09/02/17 1307   09/02/17 0724  bacitracin 50,000 Units in sodium chloride irrigation 0.9 % 500 mL irrigation  Status:  Discontinued       As needed 09/02/17 0915 09/02/17 1307   09/02/17 0715  vancomycin (VANCOCIN) 1,250 mg in sodium chloride 0.9 % 250 mL IVPB  Status:  Discontinued     1,250 mg 166.7 mL/hr over 90 Minutes Intravenous To Surgery 09/01/17 1306 09/02/17 1431   08/29/17 2300  cefTRIAXone (ROCEPHIN) 1 g in dextrose 5 % 50 mL IVPB  Status:  Discontinued     1 g 100 mL/hr over 30 Minutes Intravenous Every 24 hours 08/29/17 2216 09/05/17 0741       Objective: Vitals:   09/05/17 0104 09/05/17 0612 09/05/17 0920 09/05/17 1334  BP: (!) 120/48 (!) 118/56 (!) 136/51 (!) 113/46  Pulse: 81 79 (!) 108 80  Resp: 16 16 18 18   Temp: 98.9 F (37.2 C) 99.1 F (37.3 C) 98.8 F (37.1 C) 98.9 F (37.2 C)  TempSrc: Oral Oral Oral Oral  SpO2: 99% 93% 97% 97%  Weight:      Height:        Intake/Output Summary (Last 24 hours) at 09/05/2017 1408 Last data filed at 09/05/2017 0900 Gross per 24 hour  Intake 390 ml  Output 1450 ml  Net -1060 ml   Filed Weights   08/29/17 0833  Weight: 72.6 kg (160  lb)    Examination: General exam: Appears comfortable  HEENT: PERRLA, oral mucosa moist, no sclera icterus or thrush Respiratory system: Clear to auscultation. Respiratory effort normal. Cardiovascular system: S1 & S2 heard, RRR.  No murmurs  Gastrointestinal system: Abdomen soft, non-tender, nondistended. Normal bowel sound. No organomegaly Extremities: No cyanosis, clubbing or edema Skin: No rashes or ulcers Psychiatry:  Mood & affect appropriate.     Data Reviewed: I have personally reviewed following labs and imaging studies  CBC: Recent Labs  Lab 09/01/17 0412 09/03/17 0244 09/03/17 1129 09/04/17 0611  WBC 3.1* 6.2  --  5.2  HGB 8.7* 7.1* 7.6* 7.1*  HCT 26.7* 21.7* 23.7* 22.3*  MCV 86.4 84.1  --   85.4  PLT 181 212  --  300   Basic Metabolic Panel: Recent Labs  Lab 09/01/17 0412 09/03/17 0244 09/04/17 0611  NA 139 137 138  K 3.7 3.8 3.2*  CL 110 106 108  CO2 23 25 24   GLUCOSE 113* 190* 126*  BUN 11 10 7   CREATININE 0.68 0.78 0.58  CALCIUM 8.4* 7.8* 7.9*   GFR: Estimated Creatinine Clearance: 71.7 mL/min (by C-G formula based on SCr of 0.58 mg/dL). Liver Function Tests: No results for input(s): AST, ALT, ALKPHOS, BILITOT, PROT, ALBUMIN in the last 168 hours. No results for input(s): LIPASE, AMYLASE in the last 168 hours. No results for input(s): AMMONIA in the last 168 hours. Coagulation Profile: No results for input(s): INR, PROTIME in the last 168 hours. Cardiac Enzymes: No results for input(s): CKTOTAL, CKMB, CKMBINDEX, TROPONINI in the last 168 hours. BNP (last 3 results) No results for input(s): PROBNP in the last 8760 hours. HbA1C: No results for input(s): HGBA1C in the last 72 hours. CBG: Recent Labs  Lab 09/04/17 1325 09/04/17 1719 09/04/17 2109 09/05/17 0630 09/05/17 1112  GLUCAP 178* 128* 231* 151* 219*   Lipid Profile: No results for input(s): CHOL, HDL, LDLCALC, TRIG, CHOLHDL, LDLDIRECT in the last 72 hours. Thyroid Function Tests: No results for input(s): TSH, T4TOTAL, FREET4, T3FREE, THYROIDAB in the last 72 hours. Anemia Panel: Recent Labs    09/04/17 1148 09/05/17 0444  VITAMINB12  --  326  FOLATE 8.8  --   FERRITIN 65  --   TIBC 232*  --   IRON 10*  --   RETICCTPCT 2.0  --    Urine analysis:    Component Value Date/Time   COLORURINE YELLOW 08/29/2017 2100   APPEARANCEUR CLOUDY (A) 08/29/2017 2100   LABSPEC 1.018 08/29/2017 2100   PHURINE 6.0 08/29/2017 2100   GLUCOSEU NEGATIVE 08/29/2017 2100   HGBUR MODERATE (A) 08/29/2017 2100   BILIRUBINUR NEGATIVE 08/29/2017 2100   BILIRUBINUR 1+ 01/31/2017 0841   KETONESUR 5 (A) 08/29/2017 2100   PROTEINUR 30 (A) 08/29/2017 2100   UROBILINOGEN 0.2 01/31/2017 0841   UROBILINOGEN 0.2  06/13/2015 0936   NITRITE NEGATIVE 08/29/2017 2100   LEUKOCYTESUR LARGE (A) 08/29/2017 2100   Sepsis Labs: @LABRCNTIP (procalcitonin:4,lacticidven:4) ) Recent Results (from the past 240 hour(s))  Culture, Urine     Status: Abnormal   Collection Time: 08/29/17 11:30 PM  Result Value Ref Range Status   Specimen Description URINE, CATHETERIZED  Final   Special Requests Normal  Final   Culture >=100,000 COLONIES/mL ESCHERICHIA COLI (A)  Final   Report Status 09/01/2017 FINAL  Final   Organism ID, Bacteria ESCHERICHIA COLI (A)  Final      Susceptibility   Escherichia coli - MIC*    AMPICILLIN >=32 RESISTANT Resistant  CEFAZOLIN <=4 SENSITIVE Sensitive     CEFTRIAXONE <=1 SENSITIVE Sensitive     CIPROFLOXACIN <=0.25 SENSITIVE Sensitive     GENTAMICIN <=1 SENSITIVE Sensitive     IMIPENEM <=0.25 SENSITIVE Sensitive     NITROFURANTOIN <=16 SENSITIVE Sensitive     TRIMETH/SULFA <=20 SENSITIVE Sensitive     AMPICILLIN/SULBACTAM 16 INTERMEDIATE Intermediate     PIP/TAZO <=4 SENSITIVE Sensitive     Extended ESBL NEGATIVE Sensitive     * >=100,000 COLONIES/mL ESCHERICHIA COLI  Surgical pcr screen     Status: None   Collection Time: 09/02/17  3:50 AM  Result Value Ref Range Status   MRSA, PCR NEGATIVE NEGATIVE Final   Staphylococcus aureus NEGATIVE NEGATIVE Final    Comment: (NOTE) The Xpert SA Assay (FDA approved for NASAL specimens in patients 64 years of age and older), is one component of a comprehensive surveillance program. It is not intended to diagnose infection nor to guide or monitor treatment.          Radiology Studies: No results found.    Scheduled Meds: . atorvastatin  20 mg Oral QPM  . baclofen  10 mg Oral TID  . bethanechol  5 mg Oral TID  . celecoxib  200 mg Oral Q12H  . docusate sodium  100 mg Oral BID  . famotidine  10 mg Oral QHS  . gabapentin  300 mg Oral TID  . insulin aspart  0-5 Units Subcutaneous QHS  . insulin aspart  0-9 Units Subcutaneous  TID WC  . lisinopril  5 mg Oral Daily  . morphine  30 mg Oral Q12H  . pantoprazole  40 mg Oral Daily  . scopolamine  1 patch Transdermal Q72H  . senna  1 tablet Oral BID  . sodium chloride flush  3 mL Intravenous Q12H  . tamsulosin  0.4 mg Oral QPC supper   Continuous Infusions: . sodium chloride    . vancomycin Stopped (09/05/17 0352)     LOS: 7 days    Time spent in minutes: 35    Debbe Odea, MD [facility] Pager: [email or web address] Password [email or web address] 09/05/2017, 2:08 PM

## 2017-09-05 NOTE — Progress Notes (Signed)
Physical Therapy Treatment Patient Details Name: Megan [name] MRN: [id number] DOB: 1951-03-28 Today's Date: 09/05/2017    History of Present Illness Patient is a 66 y/o female with recent spine surgery in November (s/p L5-S1 ALIF, L5-S1 lami, fusion) presents with Nausea/vomiting and abdominal pain. Found to have L3 vertebral body fx. Now s/p L3-4 redo lami, repositioning of Left Sacral two screw and extension of fusion to T12. + UTI. PMH includes HTN, uterine ca, shingles. HLD, DM.     PT Comments    Pt is making good progress towards her goals today, while pt is still experiencing mild dizziness in sitting she was still able to come to standing and take steps today. Pt requires minAx2 for bed mobility and modAx2 for transfers and lateral stepping 2 feet to the left. D/c plans remain appropriate. PT will continue to follow acutely.    Follow Up Recommendations  CIR     Equipment Recommendations  None recommended by PT    Recommendations for Other Services Rehab consult     Precautions / Restrictions Precautions Precautions: Back;Fall Precaution Comments: Wound vac and hemovac Required Braces or Orthoses: Spinal Brace Spinal Brace: Lumbar corset;Applied in sitting position Other Brace/Splint: AFO RLE (not present during hospitalization) Restrictions Weight Bearing Restrictions: No    Mobility  Bed Mobility Overal bed mobility: Needs Assistance Bed Mobility: Rolling;Sidelying to Sit;Sit to Sidelying Rolling: Min assist Sidelying to sit: +2 for physical assistance;HOB elevated;Min assist       General bed mobility comments: minA for management of R LE into rolling and off the bed, minAx2 for HHA to bring trunk to upright  Transfers Overall transfer level: Needs assistance Equipment used: Rolling walker (2 wheeled) Transfers: Sit to/from Stand Sit to Stand: +2 physical assistance;From elevated surface;Mod assist         General transfer comment: modAx2 for  powerup and blocking of R knee to come to standing. Pt able to stand for 45 seconds before sitting, vc for upright posture and anterior pelvic tilt. Pt with only slight c/o of dizziness, on second bout of standing pt able to take L lateral steps towards head of bed  Ambulation/Gait Ambulation/Gait assistance: Mod assist;+2 physical assistance Ambulation Distance (Feet): 2 Feet Assistive device: Rolling walker (2 wheeled) Gait Pattern/deviations: Step-to pattern;Decreased step length - right;Decreased step length - left Gait velocity: slowed Gait velocity interpretation: Below normal speed for age/gender General Gait Details: modA for R knee blocking pt able to take 4 steps towards HoB vc for sequencing of LE and RW       Balance Overall balance assessment: Needs assistance Sitting-balance support: Feet supported;Bilateral upper extremity supported Sitting balance-Leahy Scale: Fair Sitting balance - Comments: pt able to maintain sitting balance EOB and able to don brace without UE support   Standing balance support: Bilateral upper extremity supported Standing balance-Leahy Scale: Poor Standing balance comment: requires UE support and R knee blocking to maintain standig balance                            Cognition Arousal/Alertness: Awake/alert Behavior During Therapy: WFL for tasks assessed/performed Overall Cognitive Status: Within Functional Limits for tasks assessed                                           General Comments General comments (skin integrity, edema, etc.): husband  present throughout session      Pertinent Vitals/Pain Pain Assessment: Faces Faces Pain Scale: Hurts little more Pain Location: back at surgical site Pain Descriptors / Indicators: Operative site guarding;Sore Pain Intervention(s): Limited activity within patient's tolerance;Monitored during session;Repositioned    Home Living   Living Arrangements: Spouse/significant  other           Home Equipment: Shower seat;Walker - 2 wheels;Cane - single point;Walker - 4 wheels;Grab bars - tub/shower          PT Goals (current goals can now be found in the care plan section) Acute Rehab PT Goals PT Goal Formulation: With patient Time For Goal Achievement: 09/17/17 Potential to Achieve Goals: Good Progress towards PT goals: Progressing toward goals    Frequency    Min 5X/week      PT Plan Current plan remains appropriate    Co-evaluation PT/OT/SLP Co-Evaluation/Treatment: Yes Reason for Co-Treatment: For patient/therapist safety PT goals addressed during session: Mobility/safety with mobility        AM-PAC PT "6 Clicks" Daily Activity  Outcome Measure  Difficulty turning over in bed (including adjusting bedclothes, sheets and blankets)?: Unable Difficulty moving from lying on back to sitting on the side of the bed? : Unable Difficulty sitting down on and standing up from a chair with arms (e.g., wheelchair, bedside commode, etc,.)?: Unable Help needed moving to and from a bed to chair (including a wheelchair)?: Total Help needed walking in hospital room?: Total Help needed climbing 3-5 steps with a railing? : Total 6 Click Score: 6    End of Session Equipment Utilized During Treatment: Gait belt;Back brace Activity Tolerance: Patient limited by pain;Patient limited by fatigue Patient left: in bed;with call bell/phone within reach;with family/visitor present Nurse Communication: Mobility status PT Visit Diagnosis: Muscle weakness (generalized) (M62.81);Unsteadiness on feet (R26.81);Pain;Other abnormalities of gait and mobility (R26.89) Pain - part of body: (back)     Time: 2376-2831 PT Time Calculation (min) (ACUTE ONLY): 29 min  Charges:  $Therapeutic Activity: 8-22 mins                    G Codes:       Burnette Sautter B. Migdalia Dk PT, DPT Acute Rehabilitation  [phone] Pager [phone]     [place] 09/05/2017, 4:31 PM

## 2017-09-05 NOTE — PMR Pre-admission (Signed)
PMR Admission Coordinator Pre-Admission Assessment  Patient: Megan [name] is an 66 y.o., female MRN: [id number] DOB: [birth year] Height: 5\' 8"  (172.7 cm) Weight: 72.6 kg (160 lb)             Insurance Information HMO:     PPO: yes     PCP:      IPA:      80/20:      OTHER:  PRIMARY: [employer] [place]      Policy#: [id number]      Subscriber: pt CM Name: Phyllis Ginger      Phone#: [phone]     Fax#: [phone] Pre-Cert#: [id number] with update due 09/14/16     Employer:  Benefits:  Phone #: [phone]     Name:  Eff. Date: 09/13/2016     Deduct: $7000      Out of Pocket Max: $7350 includes deductible      Life Max: none CIR: 70%      SNF: 70% 60 days Outpatient: $75 co pay per visit     Co-Pay: 30 visits each PT, OT, and SLP, has used all visits for PT and OT Home Health: 70%      Co-Pay: visits per medical neccesity DME: 70%     Co-Pay: 30% Providers: in network  SECONDARY: none        Medicaid Application Date:       Case Manager:  Disability Application Date:       Case Worker:   Emergency Facilities manager Information    Name Relation Home Work [place] [phone]  [phone]   [place] Better Daughter [phone]  [phone]     Current Medical History  Patient Admitting Diagnosis: debility  History of Present Illness:  HPI: Megan [name] is a 66 y.o. right handed female with history of diabetes mellitus, as well as multiple prior thoracic or lumbar surgeries and received inpatient rehabilitation services 07/28/2017-08/19/2017 after multilevel lumbar laminectomy with lateral fixation.  She was discharged home with spouse. Supervision for wheelchair to mat transfers. She was standing in the standing frame predominantly using her wheelchair at home. Her husband works but can assist as needed as well as a daughter. Presented 08/29/2017 with nausea, vomiting, abdominal pain and frequent bowel movements. CT abdomen pelvis revealed distended  rectum by stool. Also noted L3 body fracture with compression and posterior displacement. Bowel program was regulated. Neurosurgery follow-up underwent lumbar 3-4 redo laminectomy repositioning of lateral sacral screw extension of lumbar fusion to thoracic 12 09/02/2017 per Dr. Cyndy Freeze. Hospital course pain management. Patient required wound VAC and Hemovac. Lumbar corset when out of bed applied in sitting position. Treated for Escherichia coli UTI maintained on Rocephin. Acute on chronic anemia 7.1 and monitored.   Past Medical History  Past Medical History:  Diagnosis Date  . Arthritis    "hands, back" (07/26/2017)  . Dyspnea    W/ PHYS CONDITION   . GERD (gastroesophageal reflux disease)   . History of bronchitis    "not since I quit smoking" (07/26/2017)  . History of kidney stones   . History of shingles   . Hyperlipidemia    takes Fish Oil daily  . Hypertension   . Neuromuscular disorder (HCC)    tingling toes  . 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 Pneumonia 2009  . PONV (postoperative nausea and vomiting)   . Restless leg   . Type 2 diabetes mellitus ([place])   . Uterine cancer ([place])  1979   S/P hysterectomy  . Weakness    numbness and tingling in both feet r/t back    Family History  family history includes COPD in her father; Cirrhosis in her mother; Dementia in her mother; Diabetes in her brother and mother.  Prior Rehab/Hospitalizations:  Has the patient had major surgery during 100 days prior to admission? Yes   CIR admit 12/2016 and went home with [facility] then outpatient therapy. Sees Dr. Letta Pate as an outpt.  CIR 07/28/17 until 08/19/2017 and went home at min assist level with spouse and [place] arranged.  Current Medications   Current Facility-Administered Medications:  .  0.9 %  sodium chloride infusion, 250 mL, Intravenous, Continuous, Ditty, Kevan Ny, MD .  acetaminophen (TYLENOL) tablet 650 mg, 650 mg, Oral, Q4H PRN **OR** acetaminophen (TYLENOL)  suppository 650 mg, 650 mg, Rectal, Q4H PRN, Ditty, Kevan Ny, MD .  atorvastatin (LIPITOR) tablet 20 mg, 20 mg, Oral, QPM, Costella, Vincent J, PA-C, 20 mg at 09/06/17 1757 .  baclofen (LIORESAL) tablet 10 mg, 10 mg, Oral, TID, Costella, Vincent J, PA-C, 10 mg at 09/07/17 1515 .  bethanechol (URECHOLINE) tablet 5 mg, 5 mg, Oral, TID, Costella, Vincent J, PA-C, 5 mg at 09/07/17 1515 .  bisacodyl (DULCOLAX) EC tablet 5 mg, 5 mg, Oral, Daily PRN, Ditty, Kevan Ny, MD, 5 mg at 09/06/17 0939 .  celecoxib (CELEBREX) capsule 200 mg, 200 mg, Oral, Q12H, Ditty, Kevan Ny, MD, 200 mg at 09/07/17 1023 .  diazepam (VALIUM) tablet 5 mg, 5 mg, Oral, Q6H PRN, Ditty, Kevan Ny, MD, 5 mg at 09/05/17 [phone] .  docusate sodium (COLACE) capsule 100 mg, 100 mg, Oral, BID, Ditty, Kevan Ny, MD, 100 mg at 09/07/17 1023 .  famotidine (PEPCID) tablet 10 mg, 10 mg, Oral, QHS, Mikhail, [place], DO, 10 mg at 09/06/17 2239 .  gabapentin (NEURONTIN) capsule 300 mg, 300 mg, Oral, TID, Ditty, Kevan Ny, MD, 300 mg at 09/07/17 1515 .  HYDROcodone-acetaminophen (NORCO) 7.5-325 MG per tablet 1-2 tablet, 1-2 tablet, Oral, Q4H PRN, Ditty, Kevan Ny, MD, 2 tablet at 09/04/17 1259 .  insulin aspart (novoLOG) injection 0-5 Units, 0-5 Units, Subcutaneous, QHS, Bodenheimer, Charles A, NP, 2 Units at 09/04/17 2132 .  insulin aspart (novoLOG) injection 0-9 Units, 0-9 Units, Subcutaneous, TID WC, Bodenheimer, Charles A, NP, 2 Units at 09/07/17 1221 .  lisinopril (PRINIVIL,ZESTRIL) tablet 5 mg, 5 mg, Oral, Daily, Costella, Vincent J, PA-C, 5 mg at 09/07/17 1022 .  menthol-cetylpyridinium (CEPACOL) lozenge 3 mg, 1 lozenge, Oral, PRN **OR** phenol (CHLORASEPTIC) mouth spray 1 spray, 1 spray, Mouth/Throat, PRN, Ditty, Kevan Ny, MD .  morphine (MS CONTIN) 12 hr tablet 30 mg, 30 mg, Oral, Q12H, Ditty, Kevan Ny, MD, 30 mg at 09/07/17 1023 .  ondansetron (ZOFRAN) tablet 4 mg, 4 mg, Oral, Q6H PRN, 4 mg at  09/06/17 0940 **OR** ondansetron (ZOFRAN) injection 4 mg, 4 mg, Intravenous, Q6H PRN, Ditty, Kevan Ny, MD .  pantoprazole (PROTONIX) EC tablet 40 mg, 40 mg, Oral, Daily, Costella, Vincent J, PA-C, 40 mg at 09/07/17 1022 .  prochlorperazine (COMPAZINE) injection 5 mg, 5 mg, Intravenous, Q6H PRN, [place], Karsten Fells, NP, 5 mg at 08/29/17 2355 .  scopolamine (TRANSDERM-SCOP) 1 MG/3DAYS 1.5 mg, 1 patch, Transdermal, Q72H, Duane Boston, MD, 1.5 mg at 09/05/17 2000 .  senna (SENOKOT) tablet 8.6 mg, 1 tablet, Oral, BID, Ditty, Kevan Ny, MD, 8.6 mg at 09/07/17 1022 .  sodium chloride flush (NS) 0.9 % injection 3 mL, 3 mL, Intravenous,  Q12H, Ditty, Kevan Ny, MD, 3 mL at 09/06/17 2243 .  sodium chloride flush (NS) 0.9 % injection 3 mL, 3 mL, Intravenous, PRN, Ditty, Kevan Ny, MD, 3 mL at 09/03/17 2153 .  sodium phosphate (FLEET) 7-19 GM/118ML enema 1 enema, 1 enema, Rectal, Once PRN, Ditty, Kevan Ny, MD .  tamsulosin [facility]) capsule 0.4 mg, 0.4 mg, Oral, QPC supper, Costella, Vincent J, PA-C, 0.4 mg at 09/06/17 1757 .  vancomycin (VANCOCIN) IVPB 750 mg/150 ml premix, 750 mg, Intravenous, Q12H, Dang, Thuy D, RPH, Last Rate: 150 mL/hr at 09/07/17 1504, 750 mg at 09/07/17 1504 .  zolpidem (AMBIEN) tablet 5 mg, 5 mg, Oral, QHS PRN, Ditty, Kevan Ny, MD  Patients Current Diet: Diet Carb Modified Fluid consistency: Thin; Room service appropriate? Yes  Precautions / Restrictions Precautions Precautions: Back, Fall Precaution Booklet Issued: No Precaution Comments: pt able to state 2/3 back precautions, wound vac and hemovac Spinal Brace: Applied in sitting position, Thoracolumbosacral orthotic Other Brace/Splint: AFO RLE (not present during hospitalization) Restrictions Weight Bearing Restrictions: No   Has the patient had 2 or more falls or a fall with injury in the past year?No  Prior Activity Level Limited Community (1-2x/wk): just discharged 08/19/17 from CIR.  Used RW and Transport planner Was Mod I with cane prior to fall 10/08/2016 and driving. Went home 08/19/2017 at [place] assist level.  Home Assistive Devices / Equipment Home Assistive Devices/Equipment: Eyeglasses, Dentures (specify type), Walker (specify type), Other (Comment), CBG Meter, Hospital bed, Shower chair with back, Grab bars around toilet, Grab bars in shower, Hand-held shower hose, Bedside commode/3-in-1 Home Equipment: Shower seat, Environmental consultant - 2 wheels, Cane - single point, [employer] - 4 wheels, Grab bars - tub/shower  Prior Device Use: Indicate devices/aids used by the patient prior to current illness, exacerbation or injury? Motorized wheelchair or scooter and RW  Prior Functional Level Prior Function Level of Independence: Needs assistance Gait / Transfers Assistance Needed: Pt recently d/ced from CIR on Dec 7 and only walking 7 feet with w/c follow. Has not walked since being home. Transfers with assist.  ADL's / Homemaking Assistance Needed: Husband assists with ADLs and does IADLs. Pt reports using depends at home and not transferring to toilet. Comments: Has scooter for mobility.   Self Care: Did the patient need help bathing, dressing, using the toilet or eating?  Needed some help  Indoor Mobility: Did the patient need assistance with walking from room to room (with or without device)? Needed some help  Stairs: Did the patient need assistance with internal or external stairs (with or without device)? Needed some help  Functional Cognition: Did the patient need help planning regular tasks such as shopping or remembering to take medications? Needed some help  Current Functional Level Cognition  Arousal/Alertness: Awake/alert Overall Cognitive Status: Within Functional Limits for tasks assessed Orientation Level: Oriented X4 General Comments: Pt highly anxious during transfers and standing/ambulation.     Extremity Assessment (includes Sensation/Coordination)  Upper Extremity  Assessment: Generalized weakness  Lower Extremity Assessment: Defer to PT evaluation RLE Deficits / Details: Ankle AROM WFL; Limited knee flexion/extension and hip flexion secondary to weakness. RLE Sensation: decreased light touch(Numbness throughout RLE, worse in thigh, better on lateral aspect of LE) RLE Coordination: decreased fine motor, decreased gross motor LLE Deficits / Details: Grossly 3/5 throughout but not formally assessed this date. Ankle AROM WFL. LLE: Unable to fully assess due to pain LLE Sensation: [facility])    ADLs  Overall ADL's : Needs assistance/impaired Eating/Feeding: Set  up, Bed level Grooming: Brushing hair, Set up, Oral care, Sitting Grooming Details (indicate cue type and reason): Sitting EOB Upper Body Bathing: Moderate assistance, Bed level Lower Body Bathing: Total assistance, Bed level Upper Body Dressing : Minimal assistance, Sitting Upper Body Dressing Details (indicate cue type and reason): back brace and front opening gown at EOB Lower Body Dressing: Total assistance, Sitting/lateral leans Lower Body Dressing Details (indicate cue type and reason): socks and shoes Functional mobility during ADLs: +2 for physical assistance, Rolling walker, Maximal assistance(x 2 feet) General ADL Comments: Pt has remarkably strong UEs.    Mobility  Overal bed mobility: Needs Assistance Bed Mobility: Rolling, Sidelying to Sit Rolling: Min assist Sidelying to sit: +2 for physical assistance, HOB elevated, Min assist Sit to sidelying: +2 for physical assistance, Mod assist General bed mobility comments: minA for management of R LE into rolling and off the bed. Pt pulls with RUE on therapist and cued to push with LUE to sit upright.     Transfers  Overall transfer level: Needs assistance Equipment used: Rolling walker (2 wheeled) Transfers: Sit to/from Stand, Lateral/Scoot Transfers Sit to Stand: +2 physical assistance, From elevated surface, Mod assist  Lateral/Scoot  Transfers: Max assist, +2 physical assistance, From elevated surface General transfer comment: sit-to-stand from EOB x2. VC for hand placement (one on walker, one on bed) and to stand upright. Pt's right knee instability during standing. Therapist blocks right knee. Pt highly anxious after standing for <1 min and breaths audbily harder and faster, requesting to sit. Pt complains of nausea. lateral scoot transfer from EOB to chair, requiring 3 "scoots" and pt aids with UEs.      Ambulation / Gait / Stairs / Wheelchair Mobility  Ambulation/Gait Ambulation/Gait assistance: Mod assist, +2 physical assistance Ambulation Distance (Feet): 2 Feet Assistive device: Rolling walker (2 wheeled) Gait Pattern/deviations: Step-to pattern, Decreased step length - right, Decreased step length - left, Trunk flexed, Narrow base of support General Gait Details: R knee instability, requiring therapist physical assist to block. Pt takes 4 small steps, then becomes highly anxious, breathing heavily, and requesting to sit back down.  Gait velocity: decreased Gait velocity interpretation: Below normal speed for age/gender    Posture / Balance Dynamic Sitting Balance Sitting balance - Comments: Pt sits EOB and dons brace while maintaning balance. Pt able to shift weight outside of center of gravity with ability to self-correct/catch herslef with UEs before LOB occurs.  Balance Overall balance assessment: Needs assistance Sitting-balance support: Feet supported, Bilateral upper extremity supported Sitting balance-Leahy Scale: Good Sitting balance - Comments: Pt sits EOB and dons brace while maintaning balance. Pt able to shift weight outside of center of gravity with ability to self-correct/catch herslef with UEs before LOB occurs.  Standing balance support: Bilateral upper extremity supported Standing balance-Leahy Scale: Poor Standing balance comment: requires UE support and R knee blocking to maintain standig balance     Special needs/care consideration BiPAP/CPAP  N/a CPM  N/a Continuous Drip IV  N/a Dialysis  N/a Life Vest  N/a Oxygen  N/a Special Bed No Trach Size  N/a Wound Vac yes; surgical site Skin Back dressing with drain in place, VAC in place, abdominal dressing in place                           Bowel mgmt: Last documented BM 08/30/17 Bladder mgmt: pt in and out cath  Every 8 hrs pta Diabetic mgmt yes pta  Previous Home Environment Living Arrangements: Spouse/significant other  Lives With: Spouse Available Help at Discharge: Family, Available 24 hours/day Type of Home: House Home Layout: One level Home Access: Ramped entrance Bathroom Shower/Tub: Tub/shower unit, Door, Architectural technologist: Standard Bathroom Accessibility: Yes How Accessible: Accessible via walker, Other (comment) Home Care Services: Yes Type of Home Care Services: Homehealth aide, Home PT, Home OT, [place] (if known): [place] Additional Comments: Spouse works but runs his own company and can come and go as needed.   Discharge Living Setting Plans for Discharge Living Setting: Patient's home, Lives with (comment)(spouse) Type of Home at Discharge: House Discharge Home Layout: One level Discharge Home Access: [place] entrance Discharge Bathroom Shower/Tub: Tub/shower unit Discharge Bathroom Toilet: Standard Discharge Bathroom Accessibility: Yes How Accessible: Accessible via walker Does the patient have any problems obtaining your medications?: No  Social/Family/Support Systems Patient Roles: Spouse, Parent, Other (Comment) Contact Information: spouse and daughter Anticipated Caregiver: spouse, dtr, neighbor, newly retired friend Anticipated Ambulance person Information: se above Ability/Limitations of Caregiver: Spouse works but owns his company with flexible schedule.  He is a self Administrator, Civil Service and can arrange his hours to coordinate with dtr and neighbor and friend  availability to care ofr pt. Caregiver Availability: 24/7 Discharge Plan Discussed with Primary Caregiver: Yes Is Caregiver In Agreement with Plan?: Yes Does Caregiver/Family have Issues with Lodging/Transportation while Pt is in Rehab?: No  Goals/Additional Needs Patient/Family Goal for Rehab: min assist with PT, OT, and SLP Expected length of stay: [facility] 18-22 days Pt/Family Agrees to Admission and willing to participate: Yes Program Orientation Provided & Reviewed with Pt/Caregiver Including Roles  & Responsibilities: Yes  Decrease burden of Care through IP rehab admission: N/A  Possible need for SNF placement upon discharge: Not anticipated  Patient Condition: This patient's medical and functional status has changed since the consult dated: 09/05/17 in which the Rehabilitation Physician determined and documented that the patient's condition is appropriate for intensive rehabilitative care in an inpatient rehabilitation facility. See "History of Present Illness" (above) for medical update. Functional changes are: Currently requiring mod assist to ambulate 2 feet RW. Patient's medical and functional status update has been discussed with the Rehabilitation physician and patient remains appropriate for inpatient rehabilitation. Will admit to inpatient rehab today.  Preadmission Screen Completed By:  Retta Diones, 09/07/2017 3:40 PM ______________________________________________________________________   Discussed status with Dr. Posey Pronto on 09/07/17 at 1426 and received telephone approval for admission today.  Admission Coordinator:  Retta Diones, time 1540/Date 09/07/17

## 2017-09-05 NOTE — Progress Notes (Signed)
Occupational Therapy Treatment Patient Details Name: Megan [name] MRN: [id number] DOB: [birth year] Today's Date: 09/05/2017    History of present illness Patient is a 66 y/o female with recent spine surgery in November (s/p L5-S1 ALIF, L5-S1 lami, fusion) presents with Nausea/vomiting and abdominal pain. Found to have L3 vertebral body fx. Now s/p L3-4 redo lami, repositioning of Left Sacral two screw and extension of fusion to T12. + UTI. PMH includes HTN, uterine ca, shingles. HLD, DM.    OT comments  Pt progressing towards established OT goals. Pt performing grooming at EOB with set up. Pt requiring Mi nA to don back brace and Max A to don shoes while at EOB. Pt performing sit<>stand and side steps toward [facility] with Mod A +2 and RW; required physical A for R knee buckling. Pt motivated and agreeable to participate in therapy. Will continue to follow acutely. Continue to recommend dc to CIR.    Follow Up Recommendations  CIR    Equipment Recommendations  (TBD)    Recommendations for Other Services Rehab consult    Precautions / Restrictions Precautions Precautions: Back;Fall Precaution Booklet Issued: No Precaution Comments: Wound vac and hemovac Required Braces or Orthoses: Spinal Brace Spinal Brace: Lumbar corset;Applied in sitting position Other Brace/Splint: AFO RLE (not present during hospitalization) Restrictions Weight Bearing Restrictions: No       Mobility Bed Mobility Overal bed mobility: Needs Assistance Bed Mobility: Rolling;Sidelying to Sit;Sit to Sidelying Rolling: Min assist Sidelying to sit: +2 for physical assistance;HOB elevated;Min assist       General bed mobility comments: minA for management of R LE into rolling and off the bed, minAx2 for HHA to bring trunk to upright  Transfers Overall transfer level: Needs assistance Equipment used: Rolling walker (2 wheeled) Transfers: Sit to/from Stand Sit to Stand: +2 physical assistance;From elevated  surface;Mod assist         General transfer comment: modAx2 for powerup and blocking of R knee to come to standing. Pt able to stand for 45 seconds before sitting, vc for upright posture and anterior pelvic tilt. Pt with only slight c/o of dizziness, on second bout of standing pt able to take L lateral steps towards head of bed    Balance Overall balance assessment: Needs assistance Sitting-balance support: Feet supported;Bilateral upper extremity supported Sitting balance-Leahy Scale: Fair Sitting balance - Comments: pt able to maintain sitting balance EOB and able to don brace without UE support   Standing balance support: Bilateral upper extremity supported Standing balance-Leahy Scale: Poor Standing balance comment: requires UE support and R knee blocking to maintain standig balance                           ADL either performed or assessed with clinical judgement   ADL Overall ADL's : Needs assistance/impaired     Grooming: Brushing hair;Set up;Oral care;Sitting Grooming Details (indicate cue type and reason): Sitting EOB             Lower Body Dressing: Sitting/lateral leans;Maximal assistance Lower Body Dressing Details (indicate cue type and reason): Sitting EOB to don shoes. ABle to lift her feet to place in shoes             Functional mobility during ADLs: +2 for physical assistance;Rolling walker;Moderate assistance General ADL Comments: Pt contineus to have decreased functional performance. Able to maintain sitting at EOB for grooming. Pt requiring Mod A +2 to take side steps towards HOB.  Vision   Vision Assessment?: No apparent visual deficits   Perception     Praxis      Cognition Arousal/Alertness: Awake/alert Behavior During Therapy: WFL for tasks assessed/performed Overall Cognitive Status: Within Functional Limits for tasks assessed                                          Exercises     Shoulder  Instructions       General Comments husband present throughout session    Pertinent Vitals/ Pain       Pain Assessment: Faces Faces Pain Scale: Hurts little more Pain Location: back at surgical site Pain Descriptors / Indicators: Operative site guarding;Sore Pain Intervention(s): Monitored during session;Limited activity within patient's tolerance;Repositioned  Home Living   Living Arrangements: Spouse/significant other                           Home Equipment: Shower seat;Walker - 2 wheels;Cane - single point;Walker - 4 wheels;Grab bars - tub/shower      Lives With: Spouse    Prior Functioning/Environment              Frequency  Min 3X/week        Progress Toward Goals  OT Goals(current goals can now be found in the care plan section)  Progress towards OT goals: Progressing toward goals  Acute Rehab OT Goals Patient Stated Goal: to go back to CIR OT Goal Formulation: With patient/family Time For Goal Achievement: 09/17/17 Potential to Achieve Goals: Good ADL Goals Pt Will Perform Upper Body Bathing: with min assist;sitting Pt Will Perform Lower Body Bathing: with mod assist;sit to/from stand Pt Will Transfer to Toilet: with mod assist;stand pivot transfer;bedside commode Pt Will Perform Toileting - Clothing Manipulation and hygiene: with mod assist;sit to/from stand Additional ADL Goal #1: Pt will be able to complete bed mobility with min assist as precursor for ADLs and functional mobility OOB.  Plan Discharge plan remains appropriate    Co-evaluation    PT/OT/SLP Co-Evaluation/Treatment: Yes Reason for Co-Treatment: For patient/therapist safety;To address functional/ADL transfers PT goals addressed during session: Mobility/safety with mobility OT goals addressed during session: ADL's and self-care      AM-PAC PT "6 Clicks" Daily Activity     Outcome Measure   Help from another person eating meals?: A Little Help from another person  taking care of personal grooming?: A Little Help from another person toileting, which includes using toliet, bedpan, or urinal?: Total Help from another person bathing (including washing, rinsing, drying)?: Total Help from another person to put on and taking off regular upper body clothing?: A Lot Help from another person to put on and taking off regular lower body clothing?: A Lot 6 Click Score: 12    End of Session Equipment Utilized During Treatment: Back brace;Rolling walker  OT Visit Diagnosis: Unsteadiness on feet (R26.81);Other abnormalities of gait and mobility (R26.89);Muscle weakness (generalized) (M62.81);Pain Pain - part of body: (back)   Activity Tolerance Patient tolerated treatment well   Patient Left with call bell/phone within reach;with family/visitor present;with bed alarm set(sitting EOB with husband)   Nurse Communication Mobility status(pt request nausea meds; encourage EOB sitting with RN staff)        Time: 0086-7619 OT Time Calculation (min): 29 min  Charges: OT General Charges $OT Visit: 1 Visit OT Treatments $Self Care/Home Management :  8-22 mins  [place], OTR/L Acute Rehab Pager: [phone] Office: [place] 09/05/2017, 5:17 PM

## 2017-09-05 NOTE — Progress Notes (Addendum)
Neurosurgery Progress Note  No issues overnight.  Continues to have RLE spasticity   EXAM:  BP (!) 118/56 (BP Location: Left Arm)   Pulse 79   Temp 99.1 F (37.3 C) (Oral)   Resp 16   Ht 5\' 8"  (1.727 m)   Wt 72.6 kg (160 lb)   SpO2 93%   BMI 24.33 kg/m   Awake, alert, oriented  Speech fluent, appropriate  Continues to have LE weakness, R>L Wound c/d/i  PLAN Stable Continued RLE spasticity. Will add baclofen TID Continue to work with therapy CIR pending

## 2017-09-05 NOTE — NC FL2 (Signed)
[place] LEVEL OF CARE SCREENING TOOL     IDENTIFICATION  Patient Name: Megan [name] Birthdate: 11/10/1950 Sex: female Admission Date (Current Location): 08/29/2017  [facility] and [place] Number:  Herbalist and Address:  [facility] [place] [address], [place], [name] [address]      Provider Number: [id number]  Attending Physician Name and Address:  Ditty, Kevan Ny, MD  Relative Name and Phone Number:  [name], spouse, [phone]    Current Level of Care: Hospital Recommended Level of Care: [place] Prior Approval Number:    Date Approved/Denied:   PASRR Number: [id number]  Discharge Plan: SNF    Current Diagnoses: Patient Active Problem List   Diagnosis Date Noted  . Acute lower UTI 09/04/2017  . L3 vertebral fracture ([place]) 09/01/2017  . Constipation due to pain medication   . Muscle spasms of both lower extremities   . Type 2 diabetes mellitus with peripheral neuropathy (HCC)   . Hypoalbuminemia due to protein-calorie malnutrition ([place])   . Radiculopathy 07/28/2017  . Fusion of spine, lumbosacral region   . Acute blood loss anemia   . Benign essential HTN   . Diabetes mellitus type 2 in obese ([place])   . Lumbosacral spondylosis with radiculopathy 07/26/2017  . Sensation of pressure in bladder area 01/31/2017  . 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 Neurogenic bladder 12/27/2016  . 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/27/2016  . Myelopathy ([place]) 12/21/2016  . Surgery, elective   . Diabetes mellitus type 2 in nonobese (HCC)   . History of lumbar fusion   . History of fusion of cervical spine   . Neuropathic pain   . Post-operative pain   . 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 Paraparesis ([place]) 12/15/2016  . Pedal edema 12/14/2016  . Type 2 diabetes mellitus without complication, without long-term current use of insulin ([place]) 11/02/2016  . Hyperlipidemia 11/02/2016  .  Essential hypertension 11/02/2016  . 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    Orientation RESPIRATION BLADDER Height & Weight     Self, Time, Situation, Place  Normal Incontinent Weight: 72.6 kg (160 lb) Height:  5\' 8"  (172.7 cm)  BEHAVIORAL SYMPTOMS/MOOD NEUROLOGICAL BOWEL NUTRITION STATUS      Continent Diet(Please see DC Summary)  AMBULATORY STATUS COMMUNICATION OF NEEDS Skin   Extensive Assist Verbally Surgical wounds(Closed incision on back and abdomen)                       Personal Care Assistance Level of Assistance  Bathing, Feeding, Dressing Bathing Assistance: Maximum assistance Feeding assistance: Independent Dressing Assistance: Limited assistance     Functional Limitations Info  Sight Sight Info: Impaired        SPECIAL CARE FACTORS FREQUENCY  PT (By licensed PT), OT (By licensed OT)     PT Frequency: 5x/week OT Frequency: 3x/week            Contractures      Additional Factors Info  Code Status, Allergies Code Status Info: Full Allergies Info: Shellfish Allergy, Oxycodone, Penicillins           Current Medications (09/05/2017):  This is the current hospital active medication list Current Facility-Administered Medications  Medication Dose Route Frequency Provider Last Rate Last Dose  . 0.9 %  sodium chloride infusion  250 mL Intravenous Continuous Ditty, Kevan Ny, MD      . acetaminophen (TYLENOL) tablet  650 mg  650 mg Oral Q4H PRN Ditty, Kevan Ny, MD       Or  . acetaminophen (TYLENOL) suppository 650 mg  650 mg Rectal Q4H PRN Ditty, Kevan Ny, MD      . atorvastatin (LIPITOR) tablet 20 mg  20 mg Oral QPM Costella, Vincent J, PA-C   20 mg at 09/04/17 1809  . baclofen (LIORESAL) tablet 10 mg  10 mg Oral TID Costella, Vista Mink, PA-C      . bethanechol (URECHOLINE) tablet 5 mg  5 mg Oral TID Costella, Vista Mink, PA-C   5 mg at 09/04/17 2131  . bisacodyl  (DULCOLAX) EC tablet 5 mg  5 mg Oral Daily PRN Ditty, Kevan Ny, MD      . celecoxib (CELEBREX) capsule 200 mg  200 mg Oral Q12H Ditty, Kevan Ny, MD   200 mg at 09/04/17 2132  . diazepam (VALIUM) tablet 5 mg  5 mg Oral Q6H PRN Ditty, Kevan Ny, MD   5 mg at 09/05/17 1884  . docusate sodium (COLACE) capsule 100 mg  100 mg Oral BID Ditty, Kevan Ny, MD   100 mg at 09/04/17 2132  . famotidine (PEPCID) tablet 10 mg  10 mg Oral QHS Mikhail, [place], DO   10 mg at 09/04/17 2132  . gabapentin (NEURONTIN) capsule 300 mg  300 mg Oral TID Ditty, Kevan Ny, MD   300 mg at 09/04/17 2131  . HYDROcodone-acetaminophen (NORCO) 7.5-325 MG per tablet 1-2 tablet  1-2 tablet Oral Q4H PRN Ditty, Kevan Ny, MD   2 tablet at 09/04/17 1259  . insulin aspart (novoLOG) injection 0-5 Units  0-5 Units Subcutaneous QHS Vertis Kelch, NP   2 Units at 09/04/17 2132  . insulin aspart (novoLOG) injection 0-9 Units  0-9 Units Subcutaneous TID WC Bodenheimer, Charles A, NP   2 Units at 09/05/17 0654  . lisinopril (PRINIVIL,ZESTRIL) tablet 5 mg  5 mg Oral Daily Costella, Vincent J, PA-C   5 mg at 09/04/17 1022  . menthol-cetylpyridinium (CEPACOL) lozenge 3 mg  1 lozenge Oral PRN Ditty, Kevan Ny, MD       Or  . phenol (CHLORASEPTIC) mouth spray 1 spray  1 spray Mouth/Throat PRN Ditty, Kevan Ny, MD      . morphine (MS CONTIN) 12 hr tablet 30 mg  30 mg Oral Q12H Ditty, Kevan Ny, MD   30 mg at 09/04/17 2132  . ondansetron (ZOFRAN) tablet 4 mg  4 mg Oral Q6H PRN Ditty, Kevan Ny, MD   4 mg at 09/05/17 1660   Or  . ondansetron [facility]) injection 4 mg  4 mg Intravenous Q6H PRN Ditty, Kevan Ny, MD      . pantoprazole (PROTONIX) EC tablet 40 mg  40 mg Oral Daily Costella, Vincent J, PA-C   40 mg at 09/04/17 1022  . prochlorperazine (COMPAZINE) injection 5 mg  5 mg Intravenous Q6H PRN Gardiner Barefoot, NP   5 mg at 08/29/17 2355  . scopolamine (TRANSDERM-SCOP) 1  MG/3DAYS 1.5 mg  1 patch Transdermal Q72H Duane Boston, MD      . senna [facility]) tablet 8.6 mg  1 tablet Oral BID Ditty, Kevan Ny, MD   8.6 mg at 09/04/17 2132  . sodium chloride flush (NS) 0.9 % injection 3 mL  3 mL Intravenous Q12H Ditty, Kevan Ny, MD   3 mL at 09/04/17 1040  . sodium chloride flush (NS) 0.9 % injection 3 mL  3 mL Intravenous PRN Ditty, Kevan Ny,  MD   3 mL at 09/03/17 2153  . sodium phosphate (FLEET) 7-19 GM/118ML enema 1 enema  1 enema Rectal Once PRN Ditty, Kevan Ny, MD      . tamsulosin [facility]) capsule 0.4 mg  0.4 mg Oral QPC supper Traci Sermon, PA-C   0.4 mg at 09/04/17 1809  . vancomycin (VANCOCIN) IVPB 750 mg/150 ml premix  750 mg Intravenous Q12H Tyrone Apple, [facility]   Stopped at 09/05/17 2426  . zolpidem (AMBIEN) tablet 5 mg  5 mg Oral QHS PRN Ditty, Kevan Ny, MD         Discharge Medications: Please see discharge summary for a list of discharge medications.  Relevant Imaging Results:  Relevant Lab Results:   Additional Information SSN: [place], [place]

## 2017-09-05 NOTE — Consult Note (Signed)
Physical Medicine and Rehabilitation Consult Reason for Consult: Decreased functional mobility Referring Physician: Dr. Cyndy Freeze   HPI: Megan [name] is a 66 y.o. right handed female with history of diabetes mellitus, as well as multiple prior thoracic or lumbar surgeries and received inpatient rehabilitation services 07/28/2017-08/19/2017 after multilevel lumbar laminectomy with lateral fixation. History taken from chart review and patient. She was discharged home with spouse. Supervision for wheelchair to mat transfers. She was standing in the standing frame predominantly using her wheelchair at home. Her husband works but can assist as needed as well as a daughter. Presented 08/29/2017 with nausea, vomiting, abdominal pain and frequent bowel movements. CT abdomen pelvis revealed distended rectum by stool. Also noted L3 body fracture with compression and posterior displacement. Bowel program was regulated. Neurosurgery follow-up underwent lumbar 3-4 redo laminectomy repositioning of lateral sacral screw extension of lumbar fusion to thoracic 12 09/02/2017 per Dr. Cyndy Freeze. Hospital course pain management. Patient required wound VAC and Hemovac. Lumbar corset when out of bed applied in sitting position. Treated for Escherichia coli UTI maintained on Rocephin. Acute on chronic anemia 7.1 and monitored. Physical therapy evaluation completed with recommendations of physical medicine rehabilitation consult.   Review of Systems  Constitutional: Negative for chills and fever.  HENT: Negative for hearing loss.   Eyes: Negative for blurred vision and double vision.  Respiratory: Negative for cough and shortness of breath.   Cardiovascular: Positive for leg swelling. Negative for chest pain and palpitations.  Gastrointestinal: Positive for constipation, nausea and vomiting.       GERD  Genitourinary:       Urge incontinence  Musculoskeletal: Positive for back pain and myalgias.  Skin: Negative for  rash.  Neurological: Positive for sensory change and weakness.  All other systems reviewed and are negative.  Past Medical History:  Diagnosis Date  . Arthritis    "hands, back" (07/26/2017)  . Dyspnea    W/ PHYS CONDITION   . GERD (gastroesophageal reflux disease)   . History of bronchitis    "not since I quit smoking" (07/26/2017)  . History of kidney stones   . History of shingles   . Hyperlipidemia    takes Fish Oil daily  . Hypertension   . Neuromuscular disorder (HCC)    tingling toes  . 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[place]) 12/15/2016  . Pneumonia 2009  . PONV (postoperative nausea and vomiting)   . Restless leg   . Type 2 diabetes mellitus ([place])   . Uterine cancer ([place]) 1979   S/P hysterectomy  . Weakness    numbness and tingling in both feet r/t back   Past Surgical History:  Procedure Laterality Date  . ABDOMINAL EXPOSURE N/A 07/26/2017   Procedure: ABDOMINAL EXPOSURE;  Surgeon: Angelia Mould, MD;  Location: [facility];  Service: Vascular;  Laterality: N/A;  . ANTERIOR CERVICAL DECOMP/DISCECTOMY FUSION  2001  . ANTERIOR LUMBAR FUSION N/A 07/26/2017   Procedure: Lumbar five-Sacral one Anterior lumbar interbody fusion with Dr. Deitra Mayo for approach;  Surgeon: Ditty, Kevan Ny, MD;  Location: [place];  Service: Neurosurgery;  Laterality: N/A;  . APPLICATION OF ROBOTIC ASSISTANCE FOR SPINAL PROCEDURE  12/19/2016   Procedure: APPLICATION OF ROBOTIC ASSISTANCE FOR SPINAL PROCEDURE;  Surgeon: Kevan Ny Ditty, MD;  Location: [place];  Service: Neurosurgery;;  . APPLICATION OF ROBOTIC ASSISTANCE FOR SPINAL PROCEDURE N/A 07/26/2017   Procedure: APPLICATION OF ROBOTIC ASSISTANCE FOR SPINAL PROCEDURE;  Surgeon: Ditty, Kevan Ny, MD;  Location: [facility]  OR;  Service: Neurosurgery;  Laterality: N/A;  . BACK SURGERY    . BLADDER SUSPENSION  1991   tack  . INGUINAL HERNIA REPAIR Right 1991  . LUMBAR FUSION  2016; 2017; 07/26/2017   L4-5; L2-3; L5-S1  .  [place]  . TUMOR EXCISION     WERTHIN'S TUMORS BOTH SIDES OF NECK  . VAGINAL HYSTERECTOMY  1979   Family History  Problem Relation Age of Onset  . Diabetes Mother   . Dementia Mother   . Cirrhosis Mother        Non alcoholic  . COPD Father   . Diabetes Brother    Social History:  reports that she quit smoking about 11 years ago. Her smoking use included cigarettes. She has a 55.50 pack-year smoking history. she has never used smokeless tobacco. She reports that she drinks alcohol. She reports that she does not use drugs. Allergies:  Allergies  Allergen Reactions  . Shellfish Allergy Anaphylaxis, Swelling and Other (See Comments)    Tongue swells  . Oxycodone Nausea Only  . Penicillins Rash and Other (See Comments)    Has patient had a PCN reaction causing immediate rash, facial/tongue/throat swelling, SOB or lightheadedness with hypotension: Yes Has patient had a PCN reaction causing severe rash involving mucus membranes or skin necrosis: No Has patient had a PCN reaction that required hospitalization No Has patient had a PCN reaction occurring within the last 10 years: No If all of the above answers are "NO", then may proceed with Cephalosporin use.   Medications Prior to Admission  Medication Sig Dispense Refill  . atorvastatin (LIPITOR) 20 MG tablet Take 1 tablet (20 mg total) by mouth every evening. 90 tablet 3  . bethanechol (URECHOLINE) 5 MG tablet Take 5 mg by mouth 3 (three) times daily.  0  . Biotin 5000 MCG CAPS Take 5,000 mcg by mouth every morning.     . celecoxib (CELEBREX) 200 MG capsule Take 1 capsule (200 mg total) by mouth every 12 (twelve) hours. 60 capsule 0  . cholecalciferol (VITAMIN D) 1000 units tablet Take 2 tablets (2,000 Units total) by mouth daily after lunch. (Patient taking differently: Take 1,000 Units by mouth daily after lunch. ) 30 tablet 0  . Coenzyme Q10 (COQ10) 100 MG CAPS Take 100 mg daily by mouth.     .  esomeprazole (NEXIUM 24HR) 20 MG capsule Take 1 capsule (20 mg total) by mouth daily at 12 noon. 30 capsule 0  . gabapentin (NEURONTIN) 300 MG capsule Take 1 capsule (300 mg total) by mouth 3 (three) times daily. 90 capsule 2  . Garlic 643 MG CAPS Take 500 mg 2 (two) times daily by mouth.    Marland Kitchen HYDROcodone-acetaminophen (NORCO/VICODIN) 5-325 MG tablet Take 1-2 tablets by mouth every 6 (six) hours as needed for moderate pain. (Patient taking differently: Take 1 tablet by mouth every 6 (six) hours as needed for moderate pain. ) 20 tablet 0  . lisinopril (PRINIVIL,ZESTRIL) 5 MG tablet Take 1 tablet (5 mg total) by mouth daily. 30 tablet 0  . metFORMIN (GLUCOPHAGE) 500 MG tablet Take 1 tablet (500 mg total) by mouth 2 (two) times daily with a meal. 180 tablet 2  . senna-docusate (SENOKOT-S) 8.6-50 MG tablet Take 2 tablets by mouth 2 (two) times daily. (Patient taking differently: Take 1-2 tablets by mouth daily as needed for mild constipation. )    . tamsulosin (FLOMAX) 0.4 MG CAPS capsule Take 1 capsule (0.4 mg total)  by mouth daily after supper. 30 capsule 0  . tiZANidine (ZANAFLEX) 2 MG tablet Take 1 tablet (2 mg total) by mouth 3 (three) times daily. 90 tablet 0  . bisacodyl (DULCOLAX) 5 MG EC tablet Take 1 tablet (5 mg total) by mouth daily as needed for moderate constipation. (Patient not taking: Reported on 08/29/2017) 30 tablet 0  . polyethylene glycol (MIRALAX / GLYCOLAX) packet Take 17 g by mouth daily. (Patient taking differently: Take 17 g by mouth daily as needed for mild constipation. ) 14 each 0    Home: Home Living Family/patient expects to be discharged to:: Inpatient rehab Living Arrangements: Spouse/significant other Available Help at Discharge: Family, Available 24 hours/day Type of Home: House Home Access: Ramped entrance Home Layout: One level Bathroom Shower/Tub: Tub/shower unit, Door, Architectural technologist: Standard Bathroom Accessibility: Yes Home Equipment: Civil engineer, contracting,  Environmental consultant - 2 wheels, Cane - single point, Environmental consultant - 4 wheels, Grab bars - tub/shower Additional Comments: Spouse works but runs his own company and can come and go as needed.   Lives With: Spouse  Functional History: Prior Function Level of Independence: Needs assistance Gait / Transfers Assistance Needed: Pt recently d/ced from CIR on Dec 7 and only walking 7 feet with w/c follow. Has not walked since being home. Transfers with assist.  ADL's / Homemaking Assistance Needed: Husband assists with ADLs and does IADLs. Pt reports using depends at home and not transferring to toilet. Comments: Has scooter for mobility.  Functional Status:  Mobility: Bed Mobility Overal bed mobility: Needs Assistance Bed Mobility: Rolling, Sidelying to Sit, Sit to Sidelying Rolling: Mod assist Sidelying to sit: Max assist, +2 for physical assistance, HOB elevated Sit to sidelying: +2 for physical assistance, Mod assist General bed mobility comments: cues for sequencing and technique; assist to bring R LE to EOB and to elevate trunk into sitting; assist to bring bilat LE and lower trunk when returning to supine Transfers Overall transfer level: Needs assistance Equipment used: Rolling walker (2 wheeled) Transfers: Sit to/from Stand Sit to Stand: Max assist, +2 physical assistance, From elevated surface General transfer comment: cues for safe hand placement and positioning prior to stand; pt was able to stand for brief periods no longer than 30 seconds with max A +2 to power up and maintain balance; R LE weakness and R knee buckling if not assisted to brace; lateral scoot attempt to recliner deferred as pt began feeling like she would "pass out"       ADL: ADL Overall ADL's : Needs assistance/impaired Eating/Feeding: Set up, Bed level Grooming: Wash/dry hands, Wash/dry face, Brushing hair, Set up, Bed level Upper Body Bathing: Moderate assistance, Bed level Lower Body Bathing: Total assistance, Bed  level Upper Body Dressing : Bed level, Maximal assistance Lower Body Dressing: Total assistance, Bed level General ADL Comments: Pt set EOB with max assist support for balance. Became very nauseous sitting EOB and unable to tolerate OOB transfer.    Cognition: Cognition Overall Cognitive Status: Within Functional Limits for tasks assessed Orientation Level: Oriented X4 Cognition Arousal/Alertness: Awake/alert Behavior During Therapy: WFL for tasks assessed/performed Overall Cognitive Status: Within Functional Limits for tasks assessed  Blood pressure (!) 118/56, pulse 79, temperature 99.1 F (37.3 C), temperature source Oral, resp. rate 16, height 5\' 8"  (1.727 m), weight 72.6 kg (160 lb), SpO2 93 %. Physical Exam  Vitals reviewed. Constitutional: She is oriented to person, place, and time. She appears well-developed and well-nourished.  HENT:  Head: Normocephalic and atraumatic.  Eyes:  EOM are normal. Right eye exhibits no discharge. Left eye exhibits no discharge.  Neck: Normal range of motion. Neck supple. No thyromegaly present.  Cardiovascular: Normal rate, regular rhythm and normal heart sounds.  Respiratory: Effort normal and breath sounds normal. No respiratory distress.  GI: Soft. Bowel sounds are normal. She exhibits no distension.  Musculoskeletal:  No edema or tenderness in extremities  Neurological: She is alert and oriented to person, place, and time.  Motor: B/l UE 4+/5 proximal to distal B/l LE: 4/5 proximal to distal RLE: HF, KE 0/5, ADF/PF 4/5 Sensation diminished to light touch medial RLE  Skin: Skin is warm and dry.  Back incision is dressed +Drain  Psychiatric: She has a normal mood and affect. Her behavior is normal.    Results for orders placed or performed during the hospital encounter of 08/29/17 (from the past 24 hour(s))  Glucose, capillary     Status: Abnormal   Collection Time: 09/04/17 11:25 AM  Result Value Ref Range   Glucose-Capillary 170 (H)  65 - 99 mg/dL  Folate     Status: None   Collection Time: 09/04/17 11:48 AM  Result Value Ref Range   Folate 8.8 >5.9 ng/mL  Iron and TIBC     Status: Abnormal   Collection Time: 09/04/17 11:48 AM  Result Value Ref Range   Iron 10 (L) 28 - 170 ug/dL   TIBC 232 (L) 250 - 450 ug/dL   Saturation Ratios 4 (L) 10.4 - 31.8 %   UIBC 222 ug/dL  Ferritin     Status: None   Collection Time: 09/04/17 11:48 AM  Result Value Ref Range   Ferritin 65 11 - 307 ng/mL  Reticulocytes     Status: Abnormal   Collection Time: 09/04/17 11:48 AM  Result Value Ref Range   Retic Ct Pct 2.0 0.4 - 3.1 %   RBC. 2.62 (L) 3.87 - 5.11 MIL/uL   Retic Count, Absolute 52.4 19.0 - 186.0 K/uL  Glucose, capillary     Status: Abnormal   Collection Time: 09/04/17  1:25 PM  Result Value Ref Range   Glucose-Capillary 178 (H) 65 - 99 mg/dL   Comment 1 Notify RN    Comment 2 Document in Chart   Glucose, capillary     Status: Abnormal   Collection Time: 09/04/17  5:19 PM  Result Value Ref Range   Glucose-Capillary 128 (H) 65 - 99 mg/dL  Glucose, capillary     Status: Abnormal   Collection Time: 09/04/17  9:09 PM  Result Value Ref Range   Glucose-Capillary 231 (H) 65 - 99 mg/dL   Comment 1 Notify RN    Comment 2 Document in Chart   Vitamin B12     Status: None   Collection Time: 09/05/17  4:44 AM  Result Value Ref Range   Vitamin B-12 326 180 - 914 pg/mL   No results found.  Assessment/Plan: Diagnosis: Debility Labs independently reviewed.  Records reviewed and summated above.  1. Does the need for close, 24 hr/day medical supervision in concert with the patient's rehab needs make it unreasonable for this patient to be served in a less intensive setting? Yes  2. Co-Morbidities requiring supervision/potential complications:  Acute on chronic anemia (transfuse if necessary to ensure appropriate perfusion for increased activity tolerance), UTI (cont abx), post-op pain management on chronic pain (Biofeedback  training with therapies to help reduce reliance on opiate pain medications, monitor pain control during therapies, and sedation at rest and titrate to  maximum efficacy to ensure participation and gains in therapies), diabetes mellitus (Monitor in accordance with exercise and adjust meds as necessary), multiple prior thoracic or lumbar surgeries, ID (D/c IV Vanc when appropriate), hypokalemia (continue to monitor and replete as necessary) 3. Due to bladder management, bowel management, safety, skin/wound care, disease management, pain management and patient education, does the patient require 24 hr/day rehab nursing? Yes 4. Does the patient require coordinated care of a physician, rehab nurse, PT (1-2 hrs/day, 5 days/week) and OT (1-2 hrs/day, 5 days/week) to address physical and functional deficits in the context of the above medical diagnosis(es)? Yes Addressing deficits in the following areas: balance, endurance, locomotion, strength, transferring, bathing, dressing, toileting and psychosocial support 5. Can the patient actively participate in an intensive therapy program of at least 3 hrs of therapy per day at least 5 days per week? Yes 6. The potential for patient to make measurable gains while on inpatient rehab is excellent 7. Anticipated functional outcomes upon discharge from inpatient rehab are min assist  with PT, min assist with OT, min assist with SLP. 8. Estimated rehab length of stay to reach the above functional goals is: 18-22 days. 9. Anticipated D/C setting: Home 10. Anticipated post D/C treatments: HH therapy and Home excercise program 11. Overall Rehab/Functional Prognosis: good  RECOMMENDATIONS: This patient's condition is appropriate for continued rehabilitative care in the following setting: CIR when medically appropriate Patient has agreed to participate in recommended program. Yes Note that insurance prior authorization may be required for reimbursement for recommended  care.  Comment: Rehab Admissions Coordinator to follow up.  Delice Lesch, MD, ABPMR 09/03/17 Lavon Paganini Angiulli, PA-C 09/05/2017

## 2017-09-05 NOTE — Progress Notes (Signed)
Pharmacy Antibiotic Note  Megan [name] is a 66 y.o. female admitted on 08/29/2017 with nausea, vomiting and abdominal pain.  She has L3 vertebral body fracture s/p spinal surgery 09/02/17.  Pharmacy has been consulted for vancomycin dosing for surgical prophylaxis in setting of a drain.   -SCr= 0.58, CrCl ~ 70, WBC= 5.1, afebrile -vancomycin level= 13 at 12:30 (last dose given at 3am)   Plan: Continue Vanc 750mg  IV Q12H for trough 10-15 Monitor renal fxn, clinical progress, vanc trough as indicated    Height: 5\' 8"  (172.7 cm) Weight: 160 lb (72.6 kg) IBW/kg (Calculated) : 63.9  Temp (24hrs), Avg:98.7 F (37.1 C), Min:98 F (36.7 C), Max:99.1 F (37.3 C)  Recent Labs  Lab 09/01/17 0412 09/03/17 0244 09/04/17 0611 09/05/17 1236  WBC 3.1* 6.2 5.2  --   CREATININE 0.68 0.78 0.58  --   VANCOTROUGH  --   --   --  13*    Estimated Creatinine Clearance: 71.7 mL/min (by C-G formula based on SCr of 0.58 mg/dL).    Allergies  Allergen Reactions  . Shellfish Allergy Anaphylaxis, Swelling and Other (See Comments)    Tongue swells  . Oxycodone Nausea Only  . Penicillins Rash and Other (See Comments)    Has patient had a PCN reaction causing immediate rash, facial/tongue/throat swelling, SOB or lightheadedness with hypotension: Yes Has patient had a PCN reaction causing severe rash involving mucus membranes or skin necrosis: No Has patient had a PCN reaction that required hospitalization No Has patient had a PCN reaction occurring within the last 10 years: No If all of the above answers are "NO", then may proceed with Cephalosporin use.     CTX 12/18>> Vanc 12/21 >>   12/17 urine - E coli ( R Amp, I to Unasyn, S others)   Thuy D. Mina Marble, PharmD, BCPS Pager:  [phone] 09/05/2017, 2:13 PM

## 2017-09-06 LAB — CBC
HEMATOCRIT: 22.3 % — AB (ref 36.0–46.0)
Hemoglobin: 7 g/dL — ABNORMAL LOW (ref 12.0–15.0)
MCH: 27 pg (ref 26.0–34.0)
MCHC: 31.4 g/dL (ref 30.0–36.0)
MCV: 86.1 fL (ref 78.0–100.0)
PLATELETS: 263 10*3/uL (ref 150–400)
RBC: 2.59 MIL/uL — AB (ref 3.87–5.11)
RDW: 15 % (ref 11.5–15.5)
WBC: 5.5 10*3/uL (ref 4.0–10.5)

## 2017-09-06 LAB — BASIC METABOLIC PANEL
Anion gap: 7 (ref 5–15)
BUN: 5 mg/dL — AB (ref 6–20)
CO2: 27 mmol/L (ref 22–32)
Calcium: 8 mg/dL — ABNORMAL LOW (ref 8.9–10.3)
Chloride: 103 mmol/L (ref 101–111)
Creatinine, Ser: 0.64 mg/dL (ref 0.44–1.00)
Glucose, Bld: 167 mg/dL — ABNORMAL HIGH (ref 65–99)
POTASSIUM: 3.1 mmol/L — AB (ref 3.5–5.1)
SODIUM: 137 mmol/L (ref 135–145)

## 2017-09-06 LAB — GLUCOSE, CAPILLARY
GLUCOSE-CAPILLARY: 156 mg/dL — AB (ref 65–99)
Glucose-Capillary: 131 mg/dL — ABNORMAL HIGH (ref 65–99)
Glucose-Capillary: 143 mg/dL — ABNORMAL HIGH (ref 65–99)
Glucose-Capillary: 167 mg/dL — ABNORMAL HIGH (ref 65–99)

## 2017-09-06 MED ORDER — POTASSIUM CHLORIDE CRYS ER 20 MEQ PO TBCR
40.0000 meq | EXTENDED_RELEASE_TABLET | Freq: Once | ORAL | Status: AC
  Administered 2017-09-06: 40 meq via ORAL
  Filled 2017-09-06: qty 2

## 2017-09-06 NOTE — Progress Notes (Signed)
Patient ID: Megan [name], female   DOB: 01-18-1951, 66 y.o.   MRN: [id number] Overall doing fairly well. Pain fairly well controlled. Mobilizing especially can with the right leg. Some numbness in the right leg. Left leg is good. Continue current management

## 2017-09-06 NOTE — Progress Notes (Signed)
PROGRESS NOTE    Megan [name]   CZY:[id number]  DOB: 01/19/1951  DOA: 08/29/2017 PCP: Pleas Koch, NP   Brief Narrative:  Megan [name] is an 66 y.o. female with past medical history significant for Hypertension, diabetes mellitus and recent spinal procedure involving L5-S1 (L5-S1 ALIF).  She = presents with 3 day history of nausea, vomiting, abdominal pain and frequent bowel movement.  CT abdomen and pelvis  revealed L3 body fracture with compression and posterior displacement. Has had trouble with her right leg with weakness and numbness. NS took to OR on 12/21 for redo laminectomy and lumbar fusion.  [facility] consulted on 12/17 for nausea, vomiting and abdominal pain.  Subjective: No complaints today.   Assessment & Plan:   Principal Problem:   L3 vertebral fracture  - management per primary team  Active Problems:   Type 2 diabetes mellitus without complication, without long-term current use of insulin  -ISS in hospital    Essential hypertension - Lisinopril    Neurogenic bladder   E coli UTI - completed 7 of Rocephin on 12/24 to which E coli is sensitive - need to continue caths TID as she dose at home- have ordered this - on Bethanecol and Flomax as well  Nausea, vomiting abdominal pain - ? Constipation and or due to UTI - has resolved- normal BMx after laxatives/ enemas - cont H2 blocker as well which was started this admission  Anemia - anemia panel reveals low Iron saturation but normal Ferretin. Iron binding has not increased.  - the patient declines transfusion   DVT prophylaxis: SCDs Code Status: Full code  Family Communication: husband Disposition Plan: per primary Antimicrobials:  Anti-infectives (From admission, onward)   Start     Dose/Rate Route Frequency Ordered Stop   09/02/17 2000  vancomycin (VANCOCIN) IVPB 750 mg/150 ml premix     750 mg 150 mL/hr over 60 Minutes Intravenous Every 12 hours 09/02/17  1914     09/02/17 1202  vancomycin (VANCOCIN) powder  Status:  Discontinued       As needed 09/02/17 1203 09/02/17 1307   09/02/17 0724  bacitracin 50,000 Units in sodium chloride irrigation 0.9 % 500 mL irrigation  Status:  Discontinued       As needed 09/02/17 0915 09/02/17 1307   09/02/17 0715  vancomycin (VANCOCIN) 1,250 mg in sodium chloride 0.9 % 250 mL IVPB  Status:  Discontinued     1,250 mg 166.7 mL/hr over 90 Minutes Intravenous To Surgery 09/01/17 1306 09/02/17 1431   08/29/17 2300  cefTRIAXone (ROCEPHIN) 1 g in dextrose 5 % 50 mL IVPB  Status:  Discontinued     1 g 100 mL/hr over 30 Minutes Intravenous Every 24 hours 08/29/17 2216 09/05/17 0741       Objective: Vitals:   09/06/17 0132 09/06/17 0553 09/06/17 0933 09/06/17 1452  BP: (!) 116/49 (!) 122/56 (!) 122/58 (!) 110/51  Pulse: 72 68 75 76  Resp: 18 18 18 20   Temp: 98.5 F (36.9 C) 98.3 F (36.8 C) 98.4 F (36.9 C) 98.4 F (36.9 C)  TempSrc: Oral Oral Oral Oral  SpO2: 98% 98% 100% 94%  Weight:      Height:        Intake/Output Summary (Last 24 hours) at 09/06/2017 1509 Last data filed at 09/06/2017 0942 Gross per 24 hour  Intake 243 ml  Output 1200 ml  Net -957 ml   Filed Weights   08/29/17 0932  Weight: 72.6 kg (160 lb)    Examination: General exam: Appears comfortable  HEENT: PERRLA, oral mucosa moist, no sclera icterus or thrush Respiratory system: Clear to auscultation. Respiratory effort normal. Cardiovascular system: S1 & S2 heard, RRR.  No murmurs  Gastrointestinal system: Abdomen soft, non-tender, nondistended. Normal bowel sound. No organomegaly Extremities: No cyanosis, clubbing or edema Skin: No rashes or ulcers Psychiatry:  Mood & affect appropriate.     Data Reviewed: I have personally reviewed following labs and imaging studies  CBC: Recent Labs  Lab 09/01/17 0412 09/03/17 0244 09/03/17 1129 09/04/17 0611 09/06/17 1019  WBC 3.1* 6.2  --  5.2 5.5  HGB 8.7* 7.1* 7.6* 7.1*  7.0*  HCT 26.7* 21.7* 23.7* 22.3* 22.3*  MCV 86.4 84.1  --  85.4 86.1  PLT 181 212  --  217 283   Basic Metabolic Panel: Recent Labs  Lab 09/01/17 0412 09/03/17 0244 09/04/17 0611 09/06/17 1019  NA 139 137 138 137  K 3.7 3.8 3.2* 3.1*  CL 110 106 108 103  CO2 23 25 24 27   GLUCOSE 113* 190* 126* 167*  BUN 11 10 7  5*  CREATININE 0.68 0.78 0.58 0.64  CALCIUM 8.4* 7.8* 7.9* 8.0*   GFR: Estimated Creatinine Clearance: 71.7 mL/min (by C-G formula based on SCr of 0.64 mg/dL). Liver Function Tests: No results for input(s): AST, ALT, ALKPHOS, BILITOT, PROT, ALBUMIN in the last 168 hours. No results for input(s): LIPASE, AMYLASE in the last 168 hours. No results for input(s): AMMONIA in the last 168 hours. Coagulation Profile: No results for input(s): INR, PROTIME in the last 168 hours. Cardiac Enzymes: No results for input(s): CKTOTAL, CKMB, CKMBINDEX, TROPONINI in the last 168 hours. BNP (last 3 results) No results for input(s): PROBNP in the last 8760 hours. HbA1C: No results for input(s): HGBA1C in the last 72 hours. CBG: Recent Labs  Lab 09/05/17 1112 09/05/17 1633 09/05/17 2109 09/06/17 0624 09/06/17 1151  GLUCAP 219* 132* 184* 131* 143*   Lipid Profile: No results for input(s): CHOL, HDL, LDLCALC, TRIG, CHOLHDL, LDLDIRECT in the last 72 hours. Thyroid Function Tests: No results for input(s): TSH, T4TOTAL, FREET4, T3FREE, THYROIDAB in the last 72 hours. Anemia Panel: Recent Labs    09/04/17 1148 09/05/17 0444  VITAMINB12  --  326  FOLATE 8.8  --   FERRITIN 65  --   TIBC 232*  --   IRON 10*  --   RETICCTPCT 2.0  --    Urine analysis:    Component Value Date/Time   COLORURINE YELLOW 08/29/2017 2100   APPEARANCEUR CLOUDY (A) 08/29/2017 2100   LABSPEC 1.018 08/29/2017 2100   PHURINE 6.0 08/29/2017 2100   GLUCOSEU NEGATIVE 08/29/2017 2100   HGBUR MODERATE (A) 08/29/2017 2100   BILIRUBINUR NEGATIVE 08/29/2017 2100   BILIRUBINUR 1+ 01/31/2017 0841    KETONESUR 5 (A) 08/29/2017 2100   PROTEINUR 30 (A) 08/29/2017 2100   UROBILINOGEN 0.2 01/31/2017 0841   UROBILINOGEN 0.2 06/13/2015 0936   NITRITE NEGATIVE 08/29/2017 2100   LEUKOCYTESUR LARGE (A) 08/29/2017 2100   Sepsis Labs: @LABRCNTIP (procalcitonin:4,lacticidven:4) ) Recent Results (from the past 240 hour(s))  Culture, Urine     Status: Abnormal   Collection Time: 08/29/17 11:30 PM  Result Value Ref Range Status   Specimen Description URINE, CATHETERIZED  Final   Special Requests Normal  Final   Culture >=100,000 COLONIES/mL ESCHERICHIA COLI (A)  Final   Report Status 09/01/2017 FINAL  Final   Organism ID, Bacteria ESCHERICHIA COLI (A)  Final      Susceptibility   Escherichia coli - MIC*    AMPICILLIN >=32 RESISTANT Resistant     CEFAZOLIN <=4 SENSITIVE Sensitive     CEFTRIAXONE <=1 SENSITIVE Sensitive     CIPROFLOXACIN <=0.25 SENSITIVE Sensitive     GENTAMICIN <=1 SENSITIVE Sensitive     IMIPENEM <=0.25 SENSITIVE Sensitive     NITROFURANTOIN <=16 SENSITIVE Sensitive     TRIMETH/SULFA <=20 SENSITIVE Sensitive     AMPICILLIN/SULBACTAM 16 INTERMEDIATE Intermediate     PIP/TAZO <=4 SENSITIVE Sensitive     Extended ESBL NEGATIVE Sensitive     * >=100,000 COLONIES/mL ESCHERICHIA COLI  Surgical pcr screen     Status: None   Collection Time: 09/02/17  3:50 AM  Result Value Ref Range Status   MRSA, PCR NEGATIVE NEGATIVE Final   Staphylococcus aureus NEGATIVE NEGATIVE Final    Comment: (NOTE) The Xpert SA Assay (FDA approved for NASAL specimens in patients 81 years of age and older), is one component of a comprehensive surveillance program. It is not intended to diagnose infection nor to guide or monitor treatment.          Radiology Studies: No results found.    Scheduled Meds: . atorvastatin  20 mg Oral QPM  . baclofen  10 mg Oral TID  . bethanechol  5 mg Oral TID  . celecoxib  200 mg Oral Q12H  . docusate sodium  100 mg Oral BID  . famotidine  10 mg Oral QHS   . gabapentin  300 mg Oral TID  . insulin aspart  0-5 Units Subcutaneous QHS  . insulin aspart  0-9 Units Subcutaneous TID WC  . lisinopril  5 mg Oral Daily  . morphine  30 mg Oral Q12H  . pantoprazole  40 mg Oral Daily  . scopolamine  1 patch Transdermal Q72H  . senna  1 tablet Oral BID  . sodium chloride flush  3 mL Intravenous Q12H  . tamsulosin  0.4 mg Oral QPC supper   Continuous Infusions: . sodium chloride    . vancomycin 750 mg (09/06/17 0245)     LOS: 8 days    Time spent in minutes: [place], MD [facility] Pager: [email or web address] Password [email or web address] 09/06/2017, 3:09 PM

## 2017-09-06 NOTE — Op Note (Signed)
09/02/2017  11:18 AM  PATIENT:  Megan [name]  66 y.o. female  PRE-OPERATIVE DIAGNOSIS:  L3 vertebral body fracture including fracture through the posterior fusion mass with lumbar radiculopathy due to severe foraminal stenosis; spinal instability  POST-OPERATIVE DIAGNOSIS:  Same  PROCEDURE:  Re-operative laminectomy for decompression, right and left L3-4, exploration of fusion T9-10, T10-11, T11-T12, L4-5, L5-S1, posterior instrumentation with posterolateral fusion T12-L4, repositioning of left S2 alar iliac screw, use of BMP  SURGEON:  Aldean Ast, MD  ASSISTANTS: Kristeen Miss, MD  ANESTHESIA:   General  DRAINS: Medium hemovac drain   SPECIMEN:  None  INDICATION FOR PROCEDURE: 66 year old woman s/p recent ALIF and L2 and L3 hardware removal who presents with a severe right greater than left L3 radiculopathy due to a non-traumatic L3 vertebral body fracture.  I recommended the above procedure.  The patient understood the risks, benefits, and alternatives and potential outcomes and wished to proceed.  PROCEDURE DETAILS: After smooth induction of general endotracheal anesthesia the patient was positioned prone on the open [place] table.  The skin of the thoracolumbar area was prepped and draped in the usual sterile fashion.  Lidocaine with epinephrine was injected.  The thoracolumbar scar was excised.  The soft tissue was opened in the midline.  Subperiosteal dissection was performed over the thoracic laminae and the laminae of L1 and L2.  Dissection was carried out over the internal fixation hardware from T9-12 and L4-sacrum.  Set screws were removed and then the rods were removed.  I confirmed that there was solid bridging and bone at T9-10, T10-11, T11-12, and L4-5.  There was fusion substrate present at L5-S1 but fusion had not occurred.  All hardware was secure.  I was unable to induce motion at these levels.  There was clear instability at L2-3 and L3-4.  I identified the  edges of the L3-4 laminectomy defect.  I used curettes to safely separate the scarred thecal sac from the bone edge.  I then expanded the laminectomy laterally bilaterally.  I identified the L3 and L4 pedicle.  I identified the L3 nerve roots.  I resected portions of the fractured L3 vertebral body to decompress the L3 nerve roots over as long of a length as I could visualize the nerves.  The [facility] robot was then connected to the patient using a Schanz pin in the left PSIS.  The robot was registered to the patient using fluoroscopic images.  It was used as a drill guide to cannulate pedicle tracts at L1 and L2 bilaterally.  K-wires were placed and I tapped over the K-wires.  I then palpated to confirm competency.  I injected hemostatic agent to stop the bleeding from the pedicle screw tracts.  I removed the left S2 alar iliac screw.  I used a ball tipped probe to identify the original tract which was competent at the bottom.  A-k-wire was then placed down this tract.  I tapped across the SI joint.  I placed a larger screw in the appropriate direction.  Good position was confirmed with fluoroscopy.  Rods were measured and bent.  They were placed into the tulip heads of the screws and secured with set screws.  They were finally tightened with a torque device.  The wound was irrigated with bacitracin saline.  I decorticated the transverse processes and posterior elements of T12-L4.  I laid down BMP with Magnifuse from T12-L4.  I also added some corticocancellous chips to expand the graft.  A  cross link was placed and secured.  I injected Exparel, marcaine, and saline.  I placed vancomycin powder in the wound.  A medium hemovac drain was placed.  The wound was closed in routine anatomic layers with interrupted vicryl sutures.  The skin was closed with a running monocryl stratafix suture.  A negative pressure dressing was applied.  The patient was returned to the supine position.  PATIENT DISPOSITION:  PACU  - hemodynamically stable.   Delay start of Pharmacological VTE agent (>24hrs) due to surgical blood loss or risk of bleeding:  yes

## 2017-09-06 NOTE — Progress Notes (Signed)
I met with pt, spouse and daughter at bedside on 12/24 to discuss goals and expectations of an inpt rehab admission. Recently discharged 12/7 from CIR. They prefer an inpt rehab admit before d/c home. [employer] is closed 12/24 and 12/25. We will begin insurance authorization on 12/26 for hopeful admit 12/26 pending insurance approval. [phone]

## 2017-09-07 ENCOUNTER — Encounter (HOSPITAL_COMMUNITY): Payer: Self-pay | Admitting: Neurological Surgery

## 2017-09-07 ENCOUNTER — Inpatient Hospital Stay (HOSPITAL_COMMUNITY)
Admission: RE | Admit: 2017-09-07 | Discharge: 2017-09-17 | DRG: 560 | Disposition: A | Discharge status: Home Health | Payer: BLUE CROSS/BLUE SHIELD | Source: Intra-hospital | Attending: Physical Medicine & Rehabilitation | Admitting: Physical Medicine & Rehabilitation

## 2017-09-07 DIAGNOSIS — R2689 Other abnormalities of gait and mobility: Secondary | ICD-10-CM | POA: Diagnosis present

## 2017-09-07 DIAGNOSIS — G959 Disease of spinal cord, unspecified: Secondary | ICD-10-CM | POA: Diagnosis present

## 2017-09-07 DIAGNOSIS — Z981 Arthrodesis status: Secondary | ICD-10-CM

## 2017-09-07 DIAGNOSIS — K219 Gastro-esophageal reflux disease without esophagitis: Secondary | ICD-10-CM | POA: Diagnosis present

## 2017-09-07 DIAGNOSIS — S32039S Unspecified fracture of third lumbar vertebra, sequela: Secondary | ICD-10-CM

## 2017-09-07 DIAGNOSIS — E1142 Type 2 diabetes mellitus with diabetic polyneuropathy: Secondary | ICD-10-CM | POA: Diagnosis present

## 2017-09-07 DIAGNOSIS — Z885 Allergy status to narcotic agent status: Secondary | ICD-10-CM | POA: Diagnosis not present

## 2017-09-07 DIAGNOSIS — M5415 Radiculopathy, thoracolumbar region: Secondary | ICD-10-CM | POA: Diagnosis not present

## 2017-09-07 DIAGNOSIS — M7989 Other specified soft tissue disorders: Secondary | ICD-10-CM | POA: Diagnosis not present

## 2017-09-07 DIAGNOSIS — Z4789 Encounter for other orthopedic aftercare: Principal | ICD-10-CM

## 2017-09-07 DIAGNOSIS — Z8542 Personal history of malignant neoplasm of other parts of uterus: Secondary | ICD-10-CM | POA: Diagnosis not present

## 2017-09-07 DIAGNOSIS — N319 Neuromuscular dysfunction of bladder, unspecified: Secondary | ICD-10-CM | POA: Diagnosis present

## 2017-09-07 DIAGNOSIS — E785 Hyperlipidemia, unspecified: Secondary | ICD-10-CM

## 2017-09-07 DIAGNOSIS — K592 Neurogenic bowel, not elsewhere classified: Secondary | ICD-10-CM | POA: Diagnosis not present

## 2017-09-07 DIAGNOSIS — R5381 Other malaise: Secondary | ICD-10-CM

## 2017-09-07 DIAGNOSIS — Z91013 Allergy to seafood: Secondary | ICD-10-CM | POA: Diagnosis not present

## 2017-09-07 DIAGNOSIS — M62838 Other muscle spasm: Secondary | ICD-10-CM | POA: Diagnosis present

## 2017-09-07 DIAGNOSIS — Z880 Allergy status to penicillin: Secondary | ICD-10-CM

## 2017-09-07 DIAGNOSIS — N390 Urinary tract infection, site not specified: Secondary | ICD-10-CM | POA: Diagnosis not present

## 2017-09-07 DIAGNOSIS — Z7984 Long term (current) use of oral hypoglycemic drugs: Secondary | ICD-10-CM | POA: Diagnosis not present

## 2017-09-07 DIAGNOSIS — D62 Acute posthemorrhagic anemia: Secondary | ICD-10-CM | POA: Diagnosis present

## 2017-09-07 DIAGNOSIS — I10 Essential (primary) hypertension: Secondary | ICD-10-CM | POA: Diagnosis present

## 2017-09-07 DIAGNOSIS — Z7983 Long term (current) use of bisphosphonates: Secondary | ICD-10-CM

## 2017-09-07 DIAGNOSIS — G2581 Restless legs syndrome: Secondary | ICD-10-CM | POA: Diagnosis present

## 2017-09-07 DIAGNOSIS — G8220 Paraplegia, unspecified: Secondary | ICD-10-CM | POA: Diagnosis not present

## 2017-09-07 DIAGNOSIS — Z825 Family history of asthma and other chronic lower respiratory diseases: Secondary | ICD-10-CM

## 2017-09-07 DIAGNOSIS — Z79899 Other long term (current) drug therapy: Secondary | ICD-10-CM

## 2017-09-07 DIAGNOSIS — Z90710 Acquired absence of both cervix and uterus: Secondary | ICD-10-CM | POA: Diagnosis not present

## 2017-09-07 DIAGNOSIS — G8929 Other chronic pain: Secondary | ICD-10-CM | POA: Diagnosis present

## 2017-09-07 DIAGNOSIS — Z87891 Personal history of nicotine dependence: Secondary | ICD-10-CM | POA: Diagnosis not present

## 2017-09-07 DIAGNOSIS — R5383 Other fatigue: Secondary | ICD-10-CM | POA: Diagnosis present

## 2017-09-07 DIAGNOSIS — Z833 Family history of diabetes mellitus: Secondary | ICD-10-CM | POA: Diagnosis not present

## 2017-09-07 DIAGNOSIS — B9620 Unspecified Escherichia coli [E. coli] as the cause of diseases classified elsewhere: Secondary | ICD-10-CM | POA: Diagnosis not present

## 2017-09-07 DIAGNOSIS — E119 Type 2 diabetes mellitus without complications: Secondary | ICD-10-CM

## 2017-09-07 LAB — BASIC METABOLIC PANEL
Anion gap: 7 (ref 5–15)
BUN: 5 mg/dL — ABNORMAL LOW (ref 6–20)
CALCIUM: 8.1 mg/dL — AB (ref 8.9–10.3)
CO2: 25 mmol/L (ref 22–32)
CREATININE: 0.61 mg/dL (ref 0.44–1.00)
Chloride: 107 mmol/L (ref 101–111)
GFR calc non Af Amer: >60 mL/min (ref >60)
GLUCOSE: 149 mg/dL — AB (ref 65–99)
Potassium: 3.7 mmol/L (ref 3.5–5.1)
Sodium: 139 mmol/L (ref 135–145)

## 2017-09-07 LAB — GLUCOSE, CAPILLARY
GLUCOSE-CAPILLARY: 201 mg/dL — AB (ref 65–99)
Glucose-Capillary: 125 mg/dL — ABNORMAL HIGH (ref 65–99)
Glucose-Capillary: 151 mg/dL — ABNORMAL HIGH (ref 65–99)
Glucose-Capillary: 151 mg/dL — ABNORMAL HIGH (ref 65–99)

## 2017-09-07 MED ORDER — HYDROCODONE-ACETAMINOPHEN 7.5-325 MG PO TABS
1.0000 | ORAL_TABLET | ORAL | Status: DC | PRN
  Administered 2017-09-09 – 2017-09-17 (×14): 1 via ORAL
  Filled 2017-09-07 (×5): qty 1
  Filled 2017-09-07: qty 2
  Filled 2017-09-07 (×9): qty 1

## 2017-09-07 MED ORDER — LISINOPRIL 5 MG PO TABS
5.0000 mg | ORAL_TABLET | Freq: Every day | ORAL | Status: DC
  Administered 2017-09-08 – 2017-09-17 (×10): 5 mg via ORAL
  Filled 2017-09-07 (×10): qty 1

## 2017-09-07 MED ORDER — BISACODYL 5 MG PO TBEC
5.0000 mg | DELAYED_RELEASE_TABLET | Freq: Every day | ORAL | Status: DC | PRN
  Administered 2017-09-08: 5 mg via ORAL
  Filled 2017-09-07: qty 1

## 2017-09-07 MED ORDER — FAMOTIDINE 10 MG PO TABS
10.0000 mg | ORAL_TABLET | Freq: Every day | ORAL | Status: DC
  Administered 2017-09-07 – 2017-09-16 (×10): 10 mg via ORAL
  Filled 2017-09-07 (×11): qty 1

## 2017-09-07 MED ORDER — TAMSULOSIN HCL 0.4 MG PO CAPS
0.4000 mg | ORAL_CAPSULE | Freq: Every day | ORAL | Status: DC
  Administered 2017-09-08 – 2017-09-11 (×4): 0.4 mg via ORAL
  Filled 2017-09-07 (×4): qty 1

## 2017-09-07 MED ORDER — ACETAMINOPHEN 650 MG RE SUPP: 650.0000 mg | RECTAL | Status: DC | PRN

## 2017-09-07 MED ORDER — ONDANSETRON HCL 4 MG PO TABS
4.0000 mg | ORAL_TABLET | Freq: Four times a day (QID) | ORAL | Status: DC | PRN
  Administered 2017-09-10: 4 mg via ORAL
  Filled 2017-09-07 (×2): qty 1

## 2017-09-07 MED ORDER — ONDANSETRON HCL 4 MG/2ML IJ SOLN: 4.0000 mg | Freq: Four times a day (QID) | INTRAMUSCULAR | Status: DC | PRN

## 2017-09-07 MED ORDER — BACLOFEN 10 MG PO TABS
10.0000 mg | ORAL_TABLET | Freq: Three times a day (TID) | ORAL | Status: DC
  Administered 2017-09-07 – 2017-09-11 (×13): 10 mg via ORAL
  Filled 2017-09-07 (×13): qty 1

## 2017-09-07 MED ORDER — DIAZEPAM 5 MG PO TABS: 5.0000 mg | ORAL_TABLET | Freq: Four times a day (QID) | ORAL | Status: DC | PRN

## 2017-09-07 MED ORDER — SCOPOLAMINE 1 MG/3DAYS TD PT72
1.0000 | MEDICATED_PATCH | TRANSDERMAL | Status: DC
  Administered 2017-09-08 – 2017-09-11 (×2): 1.5 mg via TRANSDERMAL
  Filled 2017-09-07 (×3): qty 1

## 2017-09-07 MED ORDER — CELECOXIB 200 MG PO CAPS
200.0000 mg | ORAL_CAPSULE | Freq: Two times a day (BID) | ORAL | Status: DC
  Administered 2017-09-07 – 2017-09-17 (×20): 200 mg via ORAL
  Filled 2017-09-07 (×20): qty 1

## 2017-09-07 MED ORDER — INSULIN ASPART 100 UNIT/ML ~~LOC~~ SOLN
0.0000 [IU] | Freq: Every day | SUBCUTANEOUS | Status: DC
  Administered 2017-09-07 – 2017-09-10 (×3): 2 [IU] via SUBCUTANEOUS

## 2017-09-07 MED ORDER — ONDANSETRON HCL 4 MG PO TABS: 4.0000 mg | ORAL_TABLET | Freq: Four times a day (QID) | ORAL | Status: DC | PRN

## 2017-09-07 MED ORDER — SENNA 8.6 MG PO TABS
1.0000 | ORAL_TABLET | Freq: Two times a day (BID) | ORAL | Status: DC
  Administered 2017-09-07 – 2017-09-17 (×16): 8.6 mg via ORAL
  Filled 2017-09-07 (×20): qty 1

## 2017-09-07 MED ORDER — PANTOPRAZOLE SODIUM 40 MG PO TBEC
40.0000 mg | DELAYED_RELEASE_TABLET | Freq: Every day | ORAL | Status: DC
  Administered 2017-09-08 – 2017-09-17 (×10): 40 mg via ORAL
  Filled 2017-09-07 (×10): qty 1

## 2017-09-07 MED ORDER — ACETAMINOPHEN 325 MG PO TABS: 650.0000 mg | ORAL_TABLET | ORAL | Status: DC | PRN

## 2017-09-07 MED ORDER — MORPHINE SULFATE ER 15 MG PO TBCR
30.0000 mg | EXTENDED_RELEASE_TABLET | Freq: Two times a day (BID) | ORAL | Status: DC
  Administered 2017-09-07: 30 mg via ORAL
  Filled 2017-09-07 (×2): qty 2

## 2017-09-07 MED ORDER — ONDANSETRON HCL 4 MG/2ML IJ SOLN
4.0000 mg | Freq: Four times a day (QID) | INTRAMUSCULAR | Status: DC | PRN
  Administered 2017-09-14: 4 mg via INTRAVENOUS
  Filled 2017-09-07: qty 2

## 2017-09-07 MED ORDER — BETHANECHOL CHLORIDE 10 MG PO TABS
5.0000 mg | ORAL_TABLET | Freq: Three times a day (TID) | ORAL | Status: DC
  Administered 2017-09-07 – 2017-09-11 (×13): 5 mg via ORAL
  Filled 2017-09-07 (×13): qty 1

## 2017-09-07 MED ORDER — GABAPENTIN 300 MG PO CAPS
300.0000 mg | ORAL_CAPSULE | Freq: Three times a day (TID) | ORAL | Status: DC
  Administered 2017-09-07 – 2017-09-17 (×29): 300 mg via ORAL
  Filled 2017-09-07 (×29): qty 1

## 2017-09-07 MED ORDER — DOCUSATE SODIUM 100 MG PO CAPS
100.0000 mg | ORAL_CAPSULE | Freq: Two times a day (BID) | ORAL | Status: DC
  Administered 2017-09-07 – 2017-09-17 (×17): 100 mg via ORAL
  Filled 2017-09-07 (×21): qty 1

## 2017-09-07 MED ORDER — SORBITOL 70 % SOLN
30.0000 mL | Freq: Every day | Status: DC | PRN
  Administered 2017-09-08 – 2017-09-13 (×2): 30 mL via ORAL
  Filled 2017-09-07 (×2): qty 30

## 2017-09-07 MED ORDER — ATORVASTATIN CALCIUM 20 MG PO TABS
20.0000 mg | ORAL_TABLET | Freq: Every evening | ORAL | Status: DC
  Administered 2017-09-08 – 2017-09-16 (×9): 20 mg via ORAL
  Filled 2017-09-07 (×9): qty 1

## 2017-09-07 NOTE — Progress Notes (Signed)
Occupational Therapy Treatment Patient Details Name: Megan [name] MRN: [id number] DOB: 1950/10/19 Today's Date: 09/07/2017    History of present illness Patient is a 66 y/o female with recent spine surgery in November (s/p L5-S1 ALIF, L5-S1 lami, fusion) presents with Nausea/vomiting and abdominal pain. Found to have L3 vertebral body fx. Now s/p L3-4 redo lami, repositioning of Left Sacral two screw and extension of fusion to T12. + UTI. PMH includes HTN, uterine ca, shingles. HLD, DM.    OT comments  Pt with anxiety and fear of falling with standing and ambulation. R LE buckling and clonus noted. Pt have very strong UEs. May be more realistic to work on LB ADL in sitting leaning side to side and sliding transfers acutely. Will continue to follow.  Follow Up Recommendations  CIR    Equipment Recommendations  (drop arm BSC)    Recommendations for Other Services      Precautions / Restrictions Precautions Precautions: Back;Fall Precaution Comments: pt able to state 2/3 back precautions, wound vac and hemovac Required Braces or Orthoses: Spinal Brace Spinal Brace: Lumbar corset;Applied in sitting position Other Brace/Splint: AFO RLE (not present during hospitalization) Restrictions Weight Bearing Restrictions: No       Mobility Bed Mobility Overal bed mobility: Needs Assistance Bed Mobility: Rolling;Sidelying to Sit Rolling: Min assist Sidelying to sit: +2 for physical assistance;Min assist;HOB elevated       General bed mobility comments: minA for management of R LE into rolling and off the bed. Pt pulls with RUE on therapist and cued to push with LUE to sit upright.   Transfers Overall transfer level: Needs assistance Equipment used: Rolling walker (2 wheeled) Transfers: Sit to/from Stand;Lateral/Scoot Transfers Sit to Stand: +2 physical assistance;From elevated surface;Mod assist        Lateral/Scoot Transfers: Max assist;+2 physical assistance;From elevated  surface General transfer comment: sit-to-stand from EOB x2. VC for hand placement (one on walker, one on bed) and to stand upright. Pt's right knee instability during standing. Therapist blocks right knee. Pt highly anxious after standing for <1 min and breaths audbily harder and faster, requesting to sit. Pt complains of nausea. lateral scoot transfer from EOB to chair, requiring 3 "scoots" and pt aids with UEs.      Balance Overall balance assessment: Needs assistance Sitting-balance support: Feet supported;Bilateral upper extremity supported Sitting balance-Leahy Scale: Fair Sitting balance - Comments: Pt sits EOB and dons brace while maintaning balance. Pt able to shift weight outside of center of gravity with ability to self-correct/catch herslef with UEs before LOB occurs.    Standing balance support: Bilateral upper extremity supported Standing balance-Leahy Scale: Poor Standing balance comment: requires UE support and R knee blocking to maintain standig balance                           ADL either performed or assessed with clinical judgement   ADL Overall ADL's : Needs assistance/impaired                 Upper Body Dressing : Minimal assistance;Sitting Upper Body Dressing Details (indicate cue type and reason): back brace and front opening gown at EOB Lower Body Dressing: Total assistance;Sitting/lateral leans Lower Body Dressing Details (indicate cue type and reason): socks and shoes             Functional mobility during ADLs: +2 for physical assistance;Rolling walker;Maximal assistance(x 2 feet) General ADL Comments: Pt has remarkably strong UEs.  Vision       Perception     Praxis      Cognition Arousal/Alertness: Awake/alert Behavior During Therapy: Anxious Overall Cognitive Status: Within Functional Limits for tasks assessed                                 General Comments: Pt highly anxious during transfers and  standing/ambulation.         Exercises     Shoulder Instructions       General Comments Pt's daughter and grandaughter present during session. Pt's RLU with several instances of clonus throughout session. Pt reoprts that she is trying to do a little mroe each day, but she just needs to progress slowly. Pt recalls 2/3 back precautions and 3/3 with reminders.     Pertinent Vitals/ Pain       Pain Assessment: 0-10 Pain Score: 7  Pain Location: back at surgical site Pain Descriptors / Indicators: Aching Pain Intervention(s): Monitored during session;Repositioned  Home Living                                          Prior Functioning/Environment              Frequency  Min 3X/week        Progress Toward Goals  OT Goals(current goals can now be found in the care plan section)  Progress towards OT goals: Progressing toward goals  Acute Rehab OT Goals Patient Stated Goal: to go back to CIR OT Goal Formulation: With patient/family Time For Goal Achievement: 09/17/17 Potential to Achieve Goals: Good  Plan Discharge plan remains appropriate    Co-evaluation    PT/OT/SLP Co-Evaluation/Treatment: Yes Reason for Co-Treatment: For patient/therapist safety PT goals addressed during session: Mobility/safety with mobility OT goals addressed during session: Strengthening/ROM      AM-PAC PT "6 Clicks" Daily Activity     Outcome Measure   Help from another person eating meals?: None Help from another person taking care of personal grooming?: A Little Help from another person toileting, which includes using toliet, bedpan, or urinal?: Total Help from another person bathing (including washing, rinsing, drying)?: A Lot Help from another person to put on and taking off regular upper body clothing?: A Little Help from another person to put on and taking off regular lower body clothing?: Total 6 Click Score: 14    End of Session Equipment Utilized During  Treatment: Back brace;Rolling walker;Gait belt  OT Visit Diagnosis: Unsteadiness on feet (R26.81);Other abnormalities of gait and mobility (R26.89);Muscle weakness (generalized) (M62.81);Pain   Activity Tolerance Patient limited by fatigue(anxiety)   Patient Left in chair;with call bell/phone within reach;with family/visitor present   Nurse Communication          Time: 0932-6712 OT Time Calculation (min): 34 min  Charges: OT General Charges $OT Visit: 1 Visit OT Treatments $Therapeutic Activity: 8-22 mins  09/07/2017 Nestor Lewandowsky, OTR/L Pager: [phone]   Werner Lean, Haze Boyden 09/07/2017, 2:21 PM

## 2017-09-07 NOTE — H&P (Signed)
Physical Medicine and Rehabilitation Admission H&P    Chief Complaint  Patient presents with  . Nausea  . Emesis  : HPI: Megan [name] is a 66 y.o. right handed female with history of diabetes mellitus, chronic back pain as well as multiple prior thoracic or lumbar surgeries/neurogenic bladder with catheterization 3 times daily maintained on Urecholine and Flomax and received inpatient rehabilitation services 07/28/2017-08/19/2017 after multilevel lumbar laminectomy with lateral fixation. History taken from chart review and patient. She was discharged home with spouse. Supervision for wheelchair to mat transfers. She was standing in the standing frame predominantly using her wheelchair at home. Her husband works but can assist as needed as well as a daughter. Presented 08/29/2017 with nausea, vomiting, abdominal pain and frequent bowel movements. CT abdomen pelvis revealed distended rectum by stool. Also noted L3 body fracture with compression and posterior displacement. Bowel program was regulated. Neurosurgery follow-up underwent lumbar 3-4 redo laminectomy repositioning of lateral sacral screw extension of lumbar fusion to thoracic 12 09/02/2017 per Dr. Cyndy Freeze. Hospital course pain management. Patient required wound VAC and Hemovac. Lumbar corset when out of bed applied in sitting position. Treated for Escherichia coli UTI with Rocephin completed 09/05/2017. Acute on chronic anemia 7.0 and monitored. Physical and occupational therapy evaluations completed with recommendations of physical medicine rehabilitation consult. Patient was admitted for a comprehensive rehabilitation program  Review of Systems  Constitutional: Negative for chills and fever.  HENT: Negative for hearing loss.   Eyes: Negative for blurred vision and double vision.  Respiratory: Positive for shortness of breath.   Cardiovascular: Positive for leg swelling. Negative for chest pain and palpitations.  Gastrointestinal:  Positive for constipation, nausea and vomiting.  Musculoskeletal: Positive for back pain and myalgias.  Skin: Negative for rash.  Neurological: Positive for sensory change and focal weakness.  Psychiatric/Behavioral:       Anxiety  All other systems reviewed and are negative.  Past Medical History:  Diagnosis Date  . Arthritis    "hands, back" (07/26/2017)  . Dyspnea    W/ PHYS CONDITION   . GERD (gastroesophageal reflux disease)   . History of bronchitis    "not since I quit smoking" (07/26/2017)  . History of kidney stones   . History of shingles   . Hyperlipidemia    takes Fish Oil daily  . Hypertension   . Neuromuscular disorder (HCC)    tingling toes  . 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 Pneumonia 2009  . PONV (postoperative nausea and vomiting)   . Restless leg   . Type 2 diabetes mellitus ([place])   . Uterine cancer ([place]) 1979   S/P hysterectomy  . Weakness    numbness and tingling in both feet r/t back   Past Surgical History:  Procedure Laterality Date  . ABDOMINAL EXPOSURE N/A 07/26/2017   Procedure: ABDOMINAL EXPOSURE;  Surgeon: Angelia Mould, MD;  Location: [facility];  Service: Vascular;  Laterality: N/A;  . ANTERIOR CERVICAL DECOMP/DISCECTOMY FUSION  2001  . ANTERIOR LUMBAR FUSION N/A 07/26/2017   Procedure: Lumbar five-Sacral one Anterior lumbar interbody fusion with Dr. Deitra Mayo for approach;  Surgeon: Ditty, Kevan Ny, MD;  Location: [facility];  Service: Neurosurgery;  Laterality: N/A;  . APPLICATION OF ROBOTIC ASSISTANCE FOR SPINAL PROCEDURE  12/19/2016   Procedure: APPLICATION OF ROBOTIC ASSISTANCE FOR SPINAL PROCEDURE;  Surgeon: Kevan Ny Ditty, MD;  Location: [facility];  Service: Neurosurgery;;  . APPLICATION OF ROBOTIC ASSISTANCE FOR SPINAL PROCEDURE N/A 07/26/2017  Procedure: APPLICATION OF ROBOTIC ASSISTANCE FOR SPINAL PROCEDURE;  Surgeon: Ditty, Kevan Ny, MD;  Location: [facility];  Service: Neurosurgery;  Laterality:  N/A;  . BACK SURGERY    . BLADDER SUSPENSION  1991   tack  . INGUINAL HERNIA REPAIR Right 1991  . LUMBAR FUSION  2016; 2017; 07/26/2017   L4-5; L2-3; L5-S1  . [place]  . TUMOR EXCISION     WERTHIN'S TUMORS BOTH SIDES OF NECK  . VAGINAL HYSTERECTOMY  1979   Family History  Problem Relation Age of Onset  . Diabetes Mother   . Dementia Mother   . Cirrhosis Mother        Non alcoholic  . COPD Father   . Diabetes Brother    Social History:  reports that she quit smoking about 11 years ago. Her smoking use included cigarettes. She has a 55.50 pack-year smoking history. she has never used smokeless tobacco. She reports that she drinks alcohol. She reports that she does not use drugs. Allergies:  Allergies  Allergen Reactions  . Shellfish Allergy Anaphylaxis, Swelling and Other (See Comments)    Tongue swells  . Oxycodone Nausea Only  . Penicillins Rash and Other (See Comments)    Has patient had a PCN reaction causing immediate rash, facial/tongue/throat swelling, SOB or lightheadedness with hypotension: Yes Has patient had a PCN reaction causing severe rash involving mucus membranes or skin necrosis: No Has patient had a PCN reaction that required hospitalization No Has patient had a PCN reaction occurring within the last 10 years: No If all of the above answers are "NO", then may proceed with Cephalosporin use.   Medications Prior to Admission  Medication Sig Dispense Refill  . atorvastatin (LIPITOR) 20 MG tablet Take 1 tablet (20 mg total) by mouth every evening. 90 tablet 3  . bethanechol (URECHOLINE) 5 MG tablet Take 5 mg by mouth 3 (three) times daily.  0  . Biotin 5000 MCG CAPS Take 5,000 mcg by mouth every morning.     . celecoxib (CELEBREX) 200 MG capsule Take 1 capsule (200 mg total) by mouth every 12 (twelve) hours. 60 capsule 0  . cholecalciferol (VITAMIN D) 1000 units tablet Take 2 tablets (2,000 Units total) by mouth daily after lunch.  (Patient taking differently: Take 1,000 Units by mouth daily after lunch. ) 30 tablet 0  . Coenzyme Q10 (COQ10) 100 MG CAPS Take 100 mg daily by mouth.     . esomeprazole (NEXIUM 24HR) 20 MG capsule Take 1 capsule (20 mg total) by mouth daily at 12 noon. 30 capsule 0  . gabapentin (NEURONTIN) 300 MG capsule Take 1 capsule (300 mg total) by mouth 3 (three) times daily. 90 capsule 2  . Garlic 782 MG CAPS Take 500 mg 2 (two) times daily by mouth.    Marland Kitchen HYDROcodone-acetaminophen (NORCO/VICODIN) 5-325 MG tablet Take 1-2 tablets by mouth every 6 (six) hours as needed for moderate pain. (Patient taking differently: Take 1 tablet by mouth every 6 (six) hours as needed for moderate pain. ) 20 tablet 0  . lisinopril (PRINIVIL,ZESTRIL) 5 MG tablet Take 1 tablet (5 mg total) by mouth daily. 30 tablet 0  . metFORMIN (GLUCOPHAGE) 500 MG tablet Take 1 tablet (500 mg total) by mouth 2 (two) times daily with a meal. 180 tablet 2  . senna-docusate (SENOKOT-S) 8.6-50 MG tablet Take 2 tablets by mouth 2 (two) times daily. (Patient taking differently: Take 1-2 tablets by mouth daily as needed for mild constipation. )    .  tamsulosin (FLOMAX) 0.4 MG CAPS capsule Take 1 capsule (0.4 mg total) by mouth daily after supper. 30 capsule 0  . tiZANidine (ZANAFLEX) 2 MG tablet Take 1 tablet (2 mg total) by mouth 3 (three) times daily. 90 tablet 0  . bisacodyl (DULCOLAX) 5 MG EC tablet Take 1 tablet (5 mg total) by mouth daily as needed for moderate constipation. (Patient not taking: Reported on 08/29/2017) 30 tablet 0  . polyethylene glycol (MIRALAX / GLYCOLAX) packet Take 17 g by mouth daily. (Patient taking differently: Take 17 g by mouth daily as needed for mild constipation. ) 14 each 0    Drug Regimen Review Drug regimen was reviewed and remains appropriate with no significant issues identified  Home: Home Living Family/patient expects to be discharged to:: Inpatient rehab Living Arrangements: Spouse/significant  other Available Help at Discharge: Family, Available 24 hours/day Type of Home: House Home Access: Ramped entrance Home Layout: One level Bathroom Shower/Tub: Tub/shower unit, Door, Architectural technologist: Standard Bathroom Accessibility: Yes Home Equipment: Civil engineer, contracting, Environmental consultant - 2 wheels, Cane - single point, Environmental consultant - 4 wheels, Grab bars - tub/shower Additional Comments: Spouse works but runs his own company and can come and go as needed.   Lives With: Spouse   Functional History: Prior Function Level of Independence: Needs assistance Gait / Transfers Assistance Needed: Pt recently d/ced from [facility] on Dec 7 and only walking 7 feet with w/c follow. Has not walked since being home. Transfers with assist.  ADL's / Homemaking Assistance Needed: Husband assists with ADLs and does IADLs. Pt reports using depends at home and not transferring to toilet. Comments: Has scooter for mobility.   Functional Status:  Mobility: Bed Mobility Overal bed mobility: Needs Assistance Bed Mobility: Rolling, Sidelying to Sit, Sit to Sidelying Rolling: Min assist Sidelying to sit: +2 for physical assistance, HOB elevated, Min assist Sit to sidelying: +2 for physical assistance, Mod assist General bed mobility comments: minA for management of R LE into rolling and off the bed, minAx2 for HHA to bring trunk to upright Transfers Overall transfer level: Needs assistance Equipment used: Rolling walker (2 wheeled) Transfers: Sit to/from Stand Sit to Stand: +2 physical assistance, From elevated surface, Mod assist General transfer comment: modAx2 for powerup and blocking of R knee to come to standing. Pt able to stand for 45 seconds before sitting, vc for upright posture and anterior pelvic tilt. Pt with only slight c/o of dizziness, on second bout of standing pt able to take L lateral steps towards head of bed Ambulation/Gait Ambulation/Gait assistance: Mod assist, +2 physical assistance Ambulation Distance  (Feet): 2 Feet Assistive device: Rolling walker (2 wheeled) Gait Pattern/deviations: Step-to pattern, Decreased step length - right, Decreased step length - left General Gait Details: modA for R knee blocking pt able to take 4 steps towards HoB vc for sequencing of LE and RW  Gait velocity: slowed Gait velocity interpretation: Below normal speed for age/gender    ADL: ADL Overall ADL's : Needs assistance/impaired Eating/Feeding: Set up, Bed level Grooming: Brushing hair, Set up, Oral care, Sitting Grooming Details (indicate cue type and reason): Sitting EOB Upper Body Bathing: Moderate assistance, Bed level Lower Body Bathing: Total assistance, Bed level Upper Body Dressing : Minimal assistance, Sitting Upper Body Dressing Details (indicate cue type and reason): Donned back brace at EOB Lower Body Dressing: Sitting/lateral leans, Maximal assistance Lower Body Dressing Details (indicate cue type and reason): Sitting EOB to don shoes. ABle to lift her feet to place in shoes  Functional mobility during ADLs: +2 for physical assistance, Rolling walker, Moderate assistance General ADL Comments: Pt contineus to have decreased functional performance. Able to maintain sitting at EOB for grooming. Pt requiring Mod A +2 to take side steps towards HOB.  Cognition: Cognition Overall Cognitive Status: Within Functional Limits for tasks assessed Arousal/Alertness: Awake/alert Orientation Level: Oriented X4 Cognition Arousal/Alertness: Awake/alert Behavior During Therapy: WFL for tasks assessed/performed Overall Cognitive Status: Within Functional Limits for tasks assessed  Physical Exam: Blood pressure 128/61, pulse 90, temperature 97.9 F (36.6 C), temperature source Oral, resp. rate 16, height 5' 8" (1.727 m), weight 72.6 kg (160 lb), SpO2 95 %. Physical Exam  Vitals reviewed. Constitutional: She is oriented to person, place, and time. She appears well-developed and well-nourished.  HENT:   Head: Normocephalic and atraumatic.  Eyes: EOM are normal. Right eye exhibits no discharge. Left eye exhibits no discharge.  Neck: Normal range of motion. Neck supple.  Cardiovascular: Normal rate and regular rhythm.  Respiratory: Effort normal and breath sounds normal.  GI: Soft. Bowel sounds are normal.  Musculoskeletal: She exhibits edema (LE). She exhibits no tenderness.  Neurological: She is alert and oriented to person, place, and time.  Motor: B/l UE 4+/5 proximal to distal LLE: HF 3-/4 KE 4-/5, ADF/PF 4/5 RLE: HF, KE 2-/5, ADF/PF 4-/5 Sensation diminished to light touch medial RLE  Skin: Skin is warm and dry.  Drain currently in place  Psychiatric: She has a normal mood and affect. Her behavior is normal.    Results for orders placed or performed during the hospital encounter of 08/29/17 (from the past 48 hour(s))  Glucose, capillary     Status: Abnormal   Collection Time: 09/05/17 11:12 AM  Result Value Ref Range   Glucose-Capillary 219 (H) 65 - 99 mg/dL  Vancomycin, trough     Status: Abnormal   Collection Time: 09/05/17 12:36 PM  Result Value Ref Range   Vancomycin Tr 13 (L) 15 - 20 ug/mL  Glucose, capillary     Status: Abnormal   Collection Time: 09/05/17  4:33 PM  Result Value Ref Range   Glucose-Capillary 132 (H) 65 - 99 mg/dL  Glucose, capillary     Status: Abnormal   Collection Time: 09/05/17  9:09 PM  Result Value Ref Range   Glucose-Capillary 184 (H) 65 - 99 mg/dL   Comment 1 Notify RN    Comment 2 Document in Chart   Glucose, capillary     Status: Abnormal   Collection Time: 09/06/17  6:24 AM  Result Value Ref Range   Glucose-Capillary 131 (H) 65 - 99 mg/dL   Comment 1 Notify RN    Comment 2 Document in Chart   Basic metabolic panel     Status: Abnormal   Collection Time: 09/06/17 10:19 AM  Result Value Ref Range   Sodium 137 135 - 145 mmol/L   Potassium 3.1 (L) 3.5 - 5.1 mmol/L   Chloride 103 101 - 111 mmol/L   CO2 27 22 - 32 mmol/L   Glucose,  Bld 167 (H) 65 - 99 mg/dL   BUN 5 (L) 6 - 20 mg/dL   Creatinine, Ser 0.64 0.44 - 1.00 mg/dL   Calcium 8.0 (L) 8.9 - 10.3 mg/dL   GFR calc non Af Amer >60 >60 mL/min   GFR calc Af Amer >60 >60 mL/min    Comment: (NOTE) The eGFR has been calculated using the CKD EPI equation. This calculation has not been validated in all clinical situations. eGFR's persistently <  60 mL/min signify possible Chronic Kidney Disease.    Anion gap 7 5 - 15  CBC     Status: Abnormal   Collection Time: 09/06/17 10:19 AM  Result Value Ref Range   WBC 5.5 4.0 - 10.5 K/uL   RBC 2.59 (L) 3.87 - 5.11 MIL/uL   Hemoglobin 7.0 (L) 12.0 - 15.0 g/dL   HCT 22.3 (L) 36.0 - 46.0 %   MCV 86.1 78.0 - 100.0 fL   MCH 27.0 26.0 - 34.0 pg   MCHC 31.4 30.0 - 36.0 g/dL   RDW 15.0 11.5 - 15.5 %   Platelets 263 150 - 400 K/uL  Glucose, capillary     Status: Abnormal   Collection Time: 09/06/17 11:51 AM  Result Value Ref Range   Glucose-Capillary 143 (H) 65 - 99 mg/dL   Comment 1 Notify RN    Comment 2 Document in Chart   Glucose, capillary     Status: Abnormal   Collection Time: 09/06/17  4:39 PM  Result Value Ref Range   Glucose-Capillary 156 (H) 65 - 99 mg/dL  Glucose, capillary     Status: Abnormal   Collection Time: 09/06/17 10:19 PM  Result Value Ref Range   Glucose-Capillary 167 (H) 65 - 99 mg/dL   Comment 1 Notify RN    Comment 2 Document in Chart   Basic metabolic panel     Status: Abnormal   Collection Time: 09/07/17  4:39 AM  Result Value Ref Range   Sodium 139 135 - 145 mmol/L   Potassium 3.7 3.5 - 5.1 mmol/L   Chloride 107 101 - 111 mmol/L   CO2 25 22 - 32 mmol/L   Glucose, Bld 149 (H) 65 - 99 mg/dL   BUN 5 (L) 6 - 20 mg/dL   Creatinine, Ser 0.61 0.44 - 1.00 mg/dL   Calcium 8.1 (L) 8.9 - 10.3 mg/dL   GFR calc non Af Amer >60 >60 mL/min   GFR calc Af Amer >60 >60 mL/min    Comment: (NOTE) The eGFR has been calculated using the CKD EPI equation. This calculation has not been validated in all  clinical situations. eGFR's persistently <60 mL/min signify possible Chronic Kidney Disease.    Anion gap 7 5 - 15  Glucose, capillary     Status: Abnormal   Collection Time: 09/07/17  6:55 AM  Result Value Ref Range   Glucose-Capillary 125 (H) 65 - 99 mg/dL   No results found.   Medical Problem List and Plan: 1.  Decreased functional mobility secondary to L3 body fracture with compression and posterior displacement.S/P lumbar 3-4 redo laminectomy and repositioning of lateral sacral screw extension of lumbar fusion to thoracic T12 09/02/2017 as well as history of multiple thoracic lumbar surgeries. Back brace when out of bed. 2.  DVT Prophylaxis/Anticoagulation: SCDs. Check vascular study 3. Pain Management/chronic back pain: Baclofen 10 mg 3 times a day, MS Contin 30 mg every 12 hours, Neurontin 300 mg 3 times a day, Celebrex 200 mg every 12 hours, Valium for muscle spasms and hydrocodone for breakthrough pain as needed 4. Mood: Provide emotional support 5. Neuropsych: This patient is capable of making decisions on her own behalf. 6. Skin/Wound Care: Routine skin checks 7. Fluids/Electrolytes/Nutrition: Routine I&O's with follow-up chemistries 8. Acute blood loss anemia. Follow-up CBC 9. Diabetes mellitus peripheral neuropathy. SSI. Hemoglobin A1c 6.0. Check blood sugars before meals and at bedtime. Patient on Glucophage 500 mg twice a day prior to admission. Resume as needed 10. Hypertension.  Lisinopril 5 mg daily 11. Neurogenic bladder. Intermittent catheterizations 3 times a day as prior to admission. Continue Urecholine and Flomax 12. Hyperlipidemia. Lipitor   Post Admission Physician Evaluation: 1. Preadmission assessment reviewed and changes made below. 2. Functional deficits secondary  to other ortho. 3. Patient is admitted to receive collaborative, interdisciplinary care between the physiatrist, rehab nursing staff, and therapy team. 4. Patient's level of medical complexity  and substantial therapy needs in context of that medical necessity cannot be provided at a lesser intensity of care such as a SNF. 5. Patient has experienced substantial functional loss from his/her baseline which was documented above under the "Functional History" and "Functional Status" headings.  Judging by the patient's diagnosis, physical exam, and functional history, the patient has potential for functional progress which will result in measurable gains while on inpatient rehab.  These gains will be of substantial and practical use upon discharge  in facilitating mobility and self-care at the household level. 21. Physiatrist will provide 24 hour management of medical needs as well as oversight of the therapy plan/treatment and provide guidance as appropriate regarding the interaction of the two. 7. 24 hour rehab nursing will assist with bladder management, safety, skin/wound care, disease management, pain management and patient education  and help integrate therapy concepts, techniques,education, etc. 8. PT will assess and treat for/with: Lower extremity strength, range of motion, stamina, balance, functional mobility, safety, adaptive techniques and equipment, woundcare, coping skills, pain control, education.   Goals are: Supervision/Min A. 9. OT will assess and treat for/with: ADL's, functional mobility, safety, upper extremity strength, adaptive techniques and equipment, wound mgt, ego support, and community reintegration.   Goals are: Supervision/Min A. Therapy may not proceed with showering this patient. 10. Case Management and Social Worker will assess and treat for psychological issues and discharge planning. 11. Team conference will be held weekly to assess progress toward goals and to determine barriers to discharge. 12. Patient will receive at least 3 hours of therapy per day at least 5 days per week. 13. ELOS: 15-19 days.       14. Prognosis:  good  Delice Lesch, MD, ABPMR Lavon Paganini  Angiulli, PA-C 09/07/2017

## 2017-09-07 NOTE — Progress Notes (Signed)
PROGRESS NOTE    Megan [name]   KZL:[id number]  DOB: 05-29-1951  DOA: 08/29/2017 PCP: Pleas Koch, NP   Brief Narrative:  Megan [name] is an 66 y.o. female with past medical history significant for Hypertension, diabetes mellitus and recent spinal procedure involving L5-S1 (L5-S1 ALIF).  She = presents with 3 day history of nausea, vomiting, abdominal pain and frequent bowel movement.  CT abdomen and pelvis  revealed L3 body fracture with compression and posterior displacement. Has had trouble with her right leg with weakness and numbness. NS took to OR on 12/21 for redo laminectomy and lumbar fusion.  [facility] consulted on 12/17 for nausea, vomiting and abdominal pain.  Subjective: She has no complaints.   Assessment & Plan:   Principal Problem:   L3 vertebral fracture  - management per primary team  Active Problems:   Type 2 diabetes mellitus without complication, without long-term current use of insulin  -ISS in hospital    Essential hypertension - Lisinopril    Neurogenic bladder   E coli UTI - completed 7 of Rocephin on 12/24 to which E coli is sensitive - need to continue caths TID as she dose at home- have ordered this - on Bethanecol and Flomax as well  Nausea, vomiting abdominal pain - ? Constipation and or due to UTI - has resolved- normal BMx after laxatives/ enemas - cont H2 blocker as well which was started this admission  Anemia - anemia panel reveals low Iron saturation but normal Ferretin. Iron binding has not increased.  - the patient declines transfusion  The patient is awaiting placement.  [facility] will sign off. Please call us if there are any further Internal medicine issues that need to be addressed.   DVT prophylaxis: SCDs Code Status: Full code  Family Communication: husband Disposition Plan: per primary Antimicrobials:  Anti-infectives (From admission, onward)   Start     Dose/Rate  Route Frequency Ordered Stop   09/02/17 2000  vancomycin (VANCOCIN) IVPB 750 mg/150 ml premix     750 mg 150 mL/hr over 60 Minutes Intravenous Every 12 hours 09/02/17 1914     09/02/17 1202  vancomycin (VANCOCIN) powder  Status:  Discontinued       As needed 09/02/17 1203 09/02/17 1307   09/02/17 0724  bacitracin 50,000 Units in sodium chloride irrigation 0.9 % 500 mL irrigation  Status:  Discontinued       As needed 09/02/17 0915 09/02/17 1307   09/02/17 0715  vancomycin (VANCOCIN) 1,250 mg in sodium chloride 0.9 % 250 mL IVPB  Status:  Discontinued     1,250 mg 166.7 mL/hr over 90 Minutes Intravenous To Surgery 09/01/17 1306 09/02/17 1431   08/29/17 2300  cefTRIAXone (ROCEPHIN) 1 g in dextrose 5 % 50 mL IVPB  Status:  Discontinued     1 g 100 mL/hr over 30 Minutes Intravenous Every 24 hours 08/29/17 2216 09/05/17 0741       Objective: Vitals:   09/06/17 2050 09/07/17 0045 09/07/17 0658 09/07/17 0951  BP: 131/60 (!) 111/54 128/61 (!) 114/55  Pulse: 82 77 90 78  Resp: 18 18 16 17   Temp: 98.6 F (37 C) 98.4 F (36.9 C) 97.9 F (36.6 C) 98.4 F (36.9 C)  TempSrc: Oral Oral Oral Oral  SpO2: 100% 95% 95% 95%  Weight:      Height:        Intake/Output Summary (Last 24 hours) at 09/07/2017 1148 Last data filed at  09/07/2017 0140 Gross per 24 hour  Intake 150 ml  Output 1100 ml  Net -950 ml   Filed Weights   08/29/17 0833  Weight: 72.6 kg (160 lb)    Examination: General exam: Appears comfortable  HEENT: PERRLA, oral mucosa moist, no sclera icterus or thrush Respiratory system: Clear to auscultation. Respiratory effort normal. Cardiovascular system: S1 & S2 heard, RRR.  No murmurs  Gastrointestinal system: Abdomen soft, non-tender, nondistended. Normal bowel sound. No organomegaly Extremities: No cyanosis, clubbing or edema Skin: No rashes or ulcers Psychiatry:  Mood & affect appropriate.     Data Reviewed: I have personally reviewed following labs and imaging  studies  CBC: Recent Labs  Lab 09/01/17 0412 09/03/17 0244 09/03/17 1129 09/04/17 0611 09/06/17 1019  WBC 3.1* 6.2  --  5.2 5.5  HGB 8.7* 7.1* 7.6* 7.1* 7.0*  HCT 26.7* 21.7* 23.7* 22.3* 22.3*  MCV 86.4 84.1  --  85.4 86.1  PLT 181 212  --  217 409   Basic Metabolic Panel: Recent Labs  Lab 09/01/17 0412 09/03/17 0244 09/04/17 0611 09/06/17 1019 09/07/17 0439  NA 139 137 138 137 139  K 3.7 3.8 3.2* 3.1* 3.7  CL 110 106 108 103 107  CO2 23 25 24 27 25   GLUCOSE 113* 190* 126* 167* 149*  BUN 11 10 7  5* 5*  CREATININE 0.68 0.78 0.58 0.64 0.61  CALCIUM 8.4* 7.8* 7.9* 8.0* 8.1*   GFR: Estimated Creatinine Clearance: 71.7 mL/min (by C-G formula based on SCr of 0.61 mg/dL). Liver Function Tests: No results for input(s): AST, ALT, ALKPHOS, BILITOT, PROT, ALBUMIN in the last 168 hours. No results for input(s): LIPASE, AMYLASE in the last 168 hours. No results for input(s): AMMONIA in the last 168 hours. Coagulation Profile: No results for input(s): INR, PROTIME in the last 168 hours. Cardiac Enzymes: No results for input(s): CKTOTAL, CKMB, CKMBINDEX, TROPONINI in the last 168 hours. BNP (last 3 results) No results for input(s): PROBNP in the last 8760 hours. HbA1C: No results for input(s): HGBA1C in the last 72 hours. CBG: Recent Labs  Lab 09/06/17 1151 09/06/17 1639 09/06/17 2219 09/07/17 0655 09/07/17 1136  GLUCAP 143* 156* 167* 125* 151*   Lipid Profile: No results for input(s): CHOL, HDL, LDLCALC, TRIG, CHOLHDL, LDLDIRECT in the last 72 hours. Thyroid Function Tests: No results for input(s): TSH, T4TOTAL, FREET4, T3FREE, THYROIDAB in the last 72 hours. Anemia Panel: Recent Labs    09/05/17 0444  VITAMINB12 326   Urine analysis:    Component Value Date/Time   COLORURINE YELLOW 08/29/2017 2100   APPEARANCEUR CLOUDY (A) 08/29/2017 2100   LABSPEC 1.018 08/29/2017 2100   PHURINE 6.0 08/29/2017 2100   GLUCOSEU NEGATIVE 08/29/2017 2100   HGBUR MODERATE (A)  08/29/2017 2100   BILIRUBINUR NEGATIVE 08/29/2017 2100   BILIRUBINUR 1+ 01/31/2017 0841   KETONESUR 5 (A) 08/29/2017 2100   PROTEINUR 30 (A) 08/29/2017 2100   UROBILINOGEN 0.2 01/31/2017 0841   UROBILINOGEN 0.2 06/13/2015 0936   NITRITE NEGATIVE 08/29/2017 2100   LEUKOCYTESUR LARGE (A) 08/29/2017 2100   Sepsis Labs: @LABRCNTIP (procalcitonin:4,lacticidven:4) ) Recent Results (from the past 240 hour(s))  Culture, Urine     Status: Abnormal   Collection Time: 08/29/17 11:30 PM  Result Value Ref Range Status   Specimen Description URINE, CATHETERIZED  Final   Special Requests Normal  Final   Culture >=100,000 COLONIES/mL ESCHERICHIA COLI (A)  Final   Report Status 09/01/2017 FINAL  Final   Organism ID, Bacteria ESCHERICHIA COLI (  A)  Final      Susceptibility   Escherichia coli - MIC*    AMPICILLIN >=32 RESISTANT Resistant     CEFAZOLIN <=4 SENSITIVE Sensitive     CEFTRIAXONE <=1 SENSITIVE Sensitive     CIPROFLOXACIN <=0.25 SENSITIVE Sensitive     GENTAMICIN <=1 SENSITIVE Sensitive     IMIPENEM <=0.25 SENSITIVE Sensitive     NITROFURANTOIN <=16 SENSITIVE Sensitive     TRIMETH/SULFA <=20 SENSITIVE Sensitive     AMPICILLIN/SULBACTAM 16 INTERMEDIATE Intermediate     PIP/TAZO <=4 SENSITIVE Sensitive     Extended ESBL NEGATIVE Sensitive     * >=100,000 COLONIES/mL ESCHERICHIA COLI  Surgical pcr screen     Status: None   Collection Time: 09/02/17  3:50 AM  Result Value Ref Range Status   MRSA, PCR NEGATIVE NEGATIVE Final   Staphylococcus aureus NEGATIVE NEGATIVE Final    Comment: (NOTE) The Xpert SA Assay (FDA approved for NASAL specimens in patients 80 years of age and older), is one component of a comprehensive surveillance program. It is not intended to diagnose infection nor to guide or monitor treatment.          Radiology Studies: No results found.    Scheduled Meds: . atorvastatin  20 mg Oral QPM  . baclofen  10 mg Oral TID  . bethanechol  5 mg Oral TID  .  celecoxib  200 mg Oral Q12H  . docusate sodium  100 mg Oral BID  . famotidine  10 mg Oral QHS  . gabapentin  300 mg Oral TID  . insulin aspart  0-5 Units Subcutaneous QHS  . insulin aspart  0-9 Units Subcutaneous TID WC  . lisinopril  5 mg Oral Daily  . morphine  30 mg Oral Q12H  . pantoprazole  40 mg Oral Daily  . scopolamine  1 patch Transdermal Q72H  . senna  1 tablet Oral BID  . sodium chloride flush  3 mL Intravenous Q12H  . tamsulosin  0.4 mg Oral QPC supper   Continuous Infusions: . sodium chloride    . vancomycin Stopped (09/07/17 0240)     LOS: 9 days    Time spent in minutes: 35    Debbe Odea, MD [facility] Pager: [email or web address] Password [facility] 09/07/2017, 11:48 AM

## 2017-09-07 NOTE — Discharge Summary (Signed)
Physician Discharge Summary  Patient ID: Megan [name] MRN: [id number] DOB/AGE: Apr 02, 1951 66 y.o.  Admit date: 08/29/2017 Discharge date: 09/07/2017  Admission Diagnoses:L3 vertebral body fracture instability and pseudoarthrosis  Discharge Diagnoses: same Principal Problem:   L3 vertebral fracture ([place]) Active Problems:   Type 2 diabetes mellitus without complication, without long-term current use of insulin (HCC)   Essential hypertension   Neurogenic bladder   Acute lower UTI   Anemia of chronic disease   Chronic pain syndrome   Hypokalemia   Discharged Condition: good  Hospital Course: issues patient was met in the hospital underwent reexploration of fusion removal of hardware and redo fusion T12-L4 with repositioning of left S2 sacral alar screw Postoperatively patient did very well recovered in the floor on the floor she was slow to progressmobilized and by hospital day 6 was stable for transfer to rehabilitation. At the time of discharge patient was awake alert she was oriented legs felt better strength is 5 out of 5 wound was clean dry and intact. I took out her Hemovac there was a small amount of serosanguineous drainage packed off of this with a bandage. There is no evidence of CSF leak intraoperatively and this did not appear to be CSF it appeared more serosanguineous in nature. Patient has been mobilizing well the drain output had been 0.  Consults:[facility] Significant Diagnostic Studies: Treatments:redo posterior lateral fusion T12-L4 Discharge Exam: Blood pressure (!) 97/49, pulse 81, temperature 98.6 F (37 C), temperature source Oral, resp. rate 17, height 5' 8"  (1.727 m), weight 72.6 kg (160 lb), SpO2 95 %. Strength out of 5 wound clean dry and intact  Disposition: inpatient rehabilitation   Allergies as of 09/07/2017      Reactions   Shellfish Allergy Anaphylaxis, Swelling, Other (See Comments)   Tongue swells   Oxycodone Nausea Only   Penicillins Rash, Other (See Comments)   Has patient had a PCN reaction causing immediate rash, facial/tongue/throat swelling, SOB or lightheadedness with hypotension: Yes Has patient had a PCN reaction causing severe rash involving mucus membranes or skin necrosis: No Has patient had a PCN reaction that required hospitalization No Has patient had a PCN reaction occurring within the last 10 years: No If all of the above answers are "NO", then may proceed with Cephalosporin use.      Medication List    TAKE these medications   atorvastatin 20 MG tablet Commonly known as:  LIPITOR Take 1 tablet (20 mg total) by mouth every evening.   bethanechol 5 MG tablet Commonly known as:  URECHOLINE Take 5 mg by mouth 3 (three) times daily.   Biotin 5000 MCG Caps Take 5,000 mcg by mouth every morning.   bisacodyl 5 MG EC tablet Commonly known as:  DULCOLAX Take 1 tablet (5 mg total) by mouth daily as needed for moderate constipation.   celecoxib 200 MG capsule Commonly known as:  CELEBREX Take 1 capsule (200 mg total) by mouth every 12 (twelve) hours.   cholecalciferol 1000 units tablet Commonly known as:  VITAMIN D Take 2 tablets (2,000 Units total) by mouth daily after lunch. What changed:  how much to take   CoQ10 100 MG Caps Take 100 mg daily by mouth.   esomeprazole 20 MG capsule Commonly known as:  NEXIUM 24HR Take 1 capsule (20 mg total) by mouth daily at 12 noon.   gabapentin 300 MG capsule Commonly known as:  NEURONTIN Take 1 capsule (300 mg total) by mouth 3 (three) times daily.  Garlic 859 MG Caps Take 500 mg 2 (two) times daily by mouth.   HYDROcodone-acetaminophen 5-325 MG tablet Commonly known as:  NORCO/VICODIN Take 1-2 tablets by mouth every 6 (six) hours as needed for moderate pain. What changed:  how much to take   lisinopril 5 MG tablet Commonly known as:  PRINIVIL,ZESTRIL Take 1 tablet (5 mg total) by mouth daily.   metFORMIN 500 MG tablet Commonly  known as:  GLUCOPHAGE Take 1 tablet (500 mg total) by mouth 2 (two) times daily with a meal. Notes to patient:  Continue home schedule    polyethylene glycol packet Commonly known as:  MIRALAX / GLYCOLAX Take 17 g by mouth daily. What changed:    when to take this  reasons to take this   senna-docusate 8.6-50 MG tablet Commonly known as:  Senokot-S Take 2 tablets by mouth 2 (two) times daily. What changed:    how much to take  when to take this  reasons to take this   tamsulosin 0.4 MG Caps capsule Commonly known as:  FLOMAX Take 1 capsule (0.4 mg total) by mouth daily after supper.   tiZANidine 2 MG tablet Commonly known as:  ZANAFLEX Take 1 tablet (2 mg total) by mouth 3 (three) times daily.        Signed: Elgar Scoggins P 09/07/2017, 5:18 PM

## 2017-09-07 NOTE — Progress Notes (Signed)
Physical Therapy Treatment Patient Details Name: Megan [name] MRN: [id number] DOB: 1951/07/29 Today's Date: 09/07/2017    History of Present Illness Patient is a 66 y/o female with recent spine surgery in November (s/p L5-S1 ALIF, L5-S1 lami, fusion) presents with Nausea/vomiting and abdominal pain. Found to have L3 vertebral body fx. Now s/p L3-4 redo lami, repositioning of Left Sacral two screw and extension of fusion to T12. + UTI. PMH includes HTN, uterine ca, shingles. HLD, DM.     PT Comments    Pt progressed towards PT goals today, ambulating 2-feet with mod A +2 and RW, performing 2 sit-to-stand with mod assist +2 and RW, and aiding in lateral scoot transfer from EOB to chair. Pt's daughter and grandaughter present in room. Pt demonstrated ability to don back brace without instruction, however required physical assist to straighten out brace. Pt recalled 2/3 back precautions and 3/3 precautions with cueing. Pt is highly anxious throughout session, demonstrating heavy breathing and stating "I need to sit back down". Current discharge plan remains appropriate. PT will follow acutely in order to continue progressing towards goals and improve mobility status while in hospital setting.    Follow Up Recommendations  CIR     Equipment Recommendations  None recommended by PT    Recommendations for Other Services       Precautions / Restrictions Precautions Precautions: Back;Fall Precaution Comments: pt able to state 2/3 back precautions, wound vac and hemovac Required Braces or Orthoses: Spinal Brace Spinal Brace: Applied in sitting position;Thoracolumbosacral orthotic Other Brace/Splint: AFO RLE (not present during hospitalization) Restrictions Weight Bearing Restrictions: No    Mobility  Bed Mobility Overal bed mobility: Needs Assistance Bed Mobility: Rolling;Sidelying to Sit Rolling: Min assist Sidelying to sit: +2 for physical assistance;HOB elevated;Min assist        General bed mobility comments: minA for management of R LE into rolling and off the bed. Pt pulls with RUE on therapist and cued to push with LUE to sit upright.   Transfers Overall transfer level: Needs assistance Equipment used: Rolling walker (2 wheeled) Transfers: Sit to/from Stand;Lateral/Scoot Transfers Sit to Stand: +2 physical assistance;From elevated surface;Mod assist        Lateral/Scoot Transfers: Max assist;+2 physical assistance;From elevated surface General transfer comment: sit-to-stand from EOB x2. VC for hand placement (one on walker, one on bed) and to stand upright. Pt's right knee instability during standing. Therapist blocks right knee. Pt highly anxious after standing for <1 min and breaths audbily harder and faster, requesting to sit. Pt complains of nausea. lateral scoot transfer from EOB to chair, requiring 3 "scoots" and pt aids with UEs.    Ambulation/Gait Ambulation/Gait assistance: Mod assist;+2 physical assistance Ambulation Distance (Feet): 2 Feet Assistive device: Rolling walker (2 wheeled) Gait Pattern/deviations: Step-to pattern;Decreased step length - right;Decreased step length - left;Trunk flexed;Narrow base of support Gait velocity: decreased Gait velocity interpretation: Below normal speed for age/gender General Gait Details: R knee instability, requiring therapist physical assist to block. Pt takes 4 small steps, then becomes highly anxious, breathing heavily, and requesting to sit back down.    Stairs            Wheelchair Mobility    Modified Rankin (Stroke Patients Only)       Balance Overall balance assessment: Needs assistance Sitting-balance support: Feet supported;Bilateral upper extremity supported Sitting balance-Leahy Scale: Good Sitting balance - Comments: Pt sits EOB and dons brace while maintaning balance. Pt able to shift weight outside of center of gravity  with ability to self-correct/catch herslef with UEs before LOB  occurs.    Standing balance support: Bilateral upper extremity supported Standing balance-Leahy Scale: Poor Standing balance comment: requires UE support and R knee blocking to maintain standig balance                            Cognition Arousal/Alertness: Awake/alert Behavior During Therapy: Anxious Overall Cognitive Status: Within Functional Limits for tasks assessed                                 General Comments: Pt highly anxious during transfers and standing/ambulation.       Exercises      General Comments General comments (skin integrity, edema, etc.): Pt's daughter and grandaughter present during session. Pt's RLE with several instances of clonus throughout session. Pt reports that she is trying to do a little more each day, but she just needs to progress slowly. Pt recalls 2/3 back precautions and 3/3 with reminders.       Pertinent Vitals/Pain Pain Assessment: 0-10 Pain Score: 7  Pain Location: back at surgical site Pain Descriptors / Indicators: Aching Pain Intervention(s): Monitored during session;Repositioned    Home Living                      Prior Function            PT Goals (current goals can now be found in the care plan section) Acute Rehab PT Goals Patient Stated Goal: to go back to CIR Progress towards PT goals: Progressing toward goals    Frequency    Min 5X/week      PT Plan Current plan remains appropriate    Co-evaluation PT/OT/SLP Co-Evaluation/Treatment: Yes Reason for Co-Treatment: For patient/therapist safety PT goals addressed during session: Mobility/safety with mobility OT goals addressed during session: Strengthening/ROM      AM-PAC PT "6 Clicks" Daily Activity  Outcome Measure  Difficulty turning over in bed (including adjusting bedclothes, sheets and blankets)?: Unable Difficulty moving from lying on back to sitting on the side of the bed? : Unable Difficulty sitting down on and  standing up from a chair with arms (e.g., wheelchair, bedside commode, etc,.)?: Unable Help needed moving to and from a bed to chair (including a wheelchair)?: A Lot Help needed walking in hospital room?: A Lot Help needed climbing 3-5 steps with a railing? : Total 6 Click Score: 8    End of Session Equipment Utilized During Treatment: Gait belt;Back brace Activity Tolerance: Patient limited by fatigue(limited by anxiety) Patient left: in chair;with call bell/phone within reach;with family/visitor present Nurse Communication: Mobility status PT Visit Diagnosis: Muscle weakness (generalized) (M62.81);Unsteadiness on feet (R26.81);Pain;Other abnormalities of gait and mobility (R26.89)     Time: [id number] PT Time Calculation (min) (ACUTE ONLY): 33 min  Charges:  $Therapeutic Activity: 8-22 mins                    G Codes:       Judee Clara, SPT   Judee Clara 09/07/2017, 3:42 PM

## 2017-09-07 NOTE — Progress Notes (Signed)
Rehab admissions - I have approval for CIR admit for today from insurance carrier.  Awaiting clearance from attending MD to admit today.  Call me for questions.  #[phone]

## 2017-09-07 NOTE — Progress Notes (Signed)
Patient discharging to CIR.   Husband said pt. Seems to be confused at times. Patient was able to answer all orientation questions for the nurse.  Nurse paged the MD and passed this information to nurse in report.  All questions and concern addressed and belongings sent with pt.  Nurse called report and spoke with Angie.

## 2017-09-08 ENCOUNTER — Inpatient Hospital Stay (HOSPITAL_COMMUNITY): Payer: BLUE CROSS/BLUE SHIELD | Admitting: Speech Pathology

## 2017-09-08 ENCOUNTER — Inpatient Hospital Stay (HOSPITAL_COMMUNITY): Payer: BLUE CROSS/BLUE SHIELD | Admitting: Occupational Therapy

## 2017-09-08 ENCOUNTER — Inpatient Hospital Stay (HOSPITAL_COMMUNITY): Payer: BLUE CROSS/BLUE SHIELD

## 2017-09-08 ENCOUNTER — Inpatient Hospital Stay (HOSPITAL_COMMUNITY): Payer: BLUE CROSS/BLUE SHIELD | Admitting: Physical Therapy

## 2017-09-08 DIAGNOSIS — M5415 Radiculopathy, thoracolumbar region: Secondary | ICD-10-CM

## 2017-09-08 DIAGNOSIS — K592 Neurogenic bowel, not elsewhere classified: Secondary | ICD-10-CM

## 2017-09-08 DIAGNOSIS — G959 Disease of spinal cord, unspecified: Secondary | ICD-10-CM

## 2017-09-08 DIAGNOSIS — M7989 Other specified soft tissue disorders: Secondary | ICD-10-CM

## 2017-09-08 DIAGNOSIS — G8220 Paraplegia, unspecified: Secondary | ICD-10-CM

## 2017-09-08 DIAGNOSIS — N319 Neuromuscular dysfunction of bladder, unspecified: Secondary | ICD-10-CM

## 2017-09-08 LAB — COMPREHENSIVE METABOLIC PANEL
ALT: 13 U/L — ABNORMAL LOW (ref 14–54)
ANION GAP: 10 (ref 5–15)
AST: 13 U/L — ABNORMAL LOW (ref 15–41)
Albumin: 2.4 g/dL — ABNORMAL LOW (ref 3.5–5.0)
Alkaline Phosphatase: 113 U/L (ref 38–126)
BUN: 5 mg/dL — ABNORMAL LOW (ref 6–20)
CALCIUM: 8.4 mg/dL — AB (ref 8.9–10.3)
CHLORIDE: 103 mmol/L (ref 101–111)
CO2: 27 mmol/L (ref 22–32)
CREATININE: 0.59 mg/dL (ref 0.44–1.00)
Glucose, Bld: 188 mg/dL — ABNORMAL HIGH (ref 65–99)
Potassium: 3.5 mmol/L (ref 3.5–5.1)
SODIUM: 140 mmol/L (ref 135–145)
Total Bilirubin: 0.9 mg/dL (ref 0.3–1.2)
Total Protein: 5.4 g/dL — ABNORMAL LOW (ref 6.5–8.1)

## 2017-09-08 LAB — CBC WITH DIFFERENTIAL/PLATELET
Basophils Absolute: 0 10*3/uL (ref 0.0–0.1)
Basophils Relative: 0 %
EOS ABS: 0.2 10*3/uL (ref 0.0–0.7)
EOS PCT: 3 %
HCT: 23.2 % — ABNORMAL LOW (ref 36.0–46.0)
Hemoglobin: 7.1 g/dL — ABNORMAL LOW (ref 12.0–15.0)
LYMPHS ABS: 0.8 10*3/uL (ref 0.7–4.0)
LYMPHS PCT: 15 %
MCH: 26.4 pg (ref 26.0–34.0)
MCHC: 30.6 g/dL (ref 30.0–36.0)
MCV: 86.2 fL (ref 78.0–100.0)
MONO ABS: 0.6 10*3/uL (ref 0.1–1.0)
MONOS PCT: 10 %
Neutro Abs: 4 10*3/uL (ref 1.7–7.7)
Neutrophils Relative %: 72 %
PLATELETS: 282 10*3/uL (ref 150–400)
RBC: 2.69 MIL/uL — ABNORMAL LOW (ref 3.87–5.11)
RDW: 15 % (ref 11.5–15.5)
WBC: 5.5 10*3/uL (ref 4.0–10.5)

## 2017-09-08 LAB — URINALYSIS, ROUTINE W REFLEX MICROSCOPIC
Bilirubin Urine: NEGATIVE
GLUCOSE, UA: NEGATIVE mg/dL
Hgb urine dipstick: NEGATIVE
KETONES UR: NEGATIVE mg/dL
LEUKOCYTES UA: NEGATIVE
Nitrite: NEGATIVE
PH: 6 (ref 5.0–8.0)
Protein, ur: NEGATIVE mg/dL
SPECIFIC GRAVITY, URINE: 1.005 (ref 1.005–1.030)

## 2017-09-08 LAB — GLUCOSE, CAPILLARY
GLUCOSE-CAPILLARY: 128 mg/dL — AB (ref 65–99)
GLUCOSE-CAPILLARY: 156 mg/dL — AB (ref 65–99)
GLUCOSE-CAPILLARY: 131 mg/dL — AB (ref 65–99)
GLUCOSE-CAPILLARY: 222 mg/dL — AB (ref 65–99)

## 2017-09-08 NOTE — Progress Notes (Signed)
LE venous duplex prelim: negative for DVT. Chyan Carnero Eunice, RDMS, RVT  

## 2017-09-08 NOTE — Evaluation (Signed)
Occupational Therapy Assessment and Plan  Patient Details  Name: Megan [name] MRN: [id number] Date of Birth: 1951/07/16  OT Diagnosis: acute pain and paraparesis at level T12- S1 Rehab Potential: Rehab Potential (ACUTE ONLY): Good ELOS: 15- 19 days   Today's Date: 09/08/2017 OT Individual Time: 1000-1110 OT Individual Time Calculation (min): 70 min     Problem List:  Patient Active Problem List   Diagnosis Date Noted  . Debility   . S/P fusion of thoracic spine   . Anemia of chronic disease   . Chronic pain syndrome   . Hypokalemia   . Acute lower UTI 09/04/2017  . L3 vertebral fracture ([place]) 09/01/2017  . Constipation due to pain medication   . Muscle spasms of both lower extremities   . Type 2 diabetes mellitus with peripheral neuropathy (HCC)   . Hypoalbuminemia due to protein-calorie malnutrition ([place])   . Radiculopathy 07/28/2017  . Fusion of spine, lumbosacral region   . Acute blood loss anemia   . Benign essential HTN   . Diabetes mellitus type 2 in obese ([place])   . Lumbosacral spondylosis with radiculopathy 07/26/2017  . Sensation of pressure in bladder area 01/31/2017  . Paraplegia, incomplete ([place]) 01/07/2017  . Neurogenic bladder 12/27/2016  . 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/27/2016  . Myelopathy ([place]) 12/21/2016  . Surgery, elective   . Diabetes mellitus type 2 in nonobese (HCC)   . History of lumbar fusion   . History of fusion of cervical spine   . Neuropathic pain   . Post-operative pain   . 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 Paraparesis ([place]) 12/15/2016  . Pedal edema 12/14/2016  . Type 2 diabetes mellitus without complication, without long-term current use of insulin ([place]) 11/02/2016  . Hyperlipidemia 11/02/2016  . Essential hypertension 11/02/2016  . 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    Past Medical History:  Past Medical History:  Diagnosis Date  .  Arthritis    "hands, back" (07/26/2017)  . Dyspnea    W/ PHYS CONDITION   . GERD (gastroesophageal reflux disease)   . History of bronchitis    "not since I quit smoking" (07/26/2017)  . History of kidney stones   . History of shingles   . Hyperlipidemia    takes Fish Oil daily  . Hypertension   . Neuromuscular disorder (HCC)    tingling toes  . 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[place]) 12/15/2016  . Pneumonia 2009  . PONV (postoperative nausea and vomiting)   . Restless leg   . Type 2 diabetes mellitus ([place])   . Uterine cancer ([place]) 1979   S/P hysterectomy  . Weakness    numbness and tingling in both feet r/t back   Past Surgical History:  Past Surgical History:  Procedure Laterality Date  . ABDOMINAL EXPOSURE N/A 07/26/2017   Procedure: ABDOMINAL EXPOSURE;  Surgeon: Angelia Mould, MD;  Location: [facility];  Service: Vascular;  Laterality: N/A;  . ANTERIOR CERVICAL DECOMP/DISCECTOMY FUSION  2001  . ANTERIOR LUMBAR FUSION N/A 07/26/2017   Procedure: Lumbar five-Sacral one Anterior lumbar interbody fusion with Dr. Deitra Mayo for approach;  Surgeon: Ditty, Kevan Ny, MD;  Location: [facility];  Service: Neurosurgery;  Laterality: N/A;  . APPLICATION OF ROBOTIC ASSISTANCE FOR SPINAL PROCEDURE  12/19/2016   Procedure: APPLICATION OF ROBOTIC ASSISTANCE FOR SPINAL PROCEDURE;  Surgeon: Kevan Ny Ditty, MD;  Location: [facility];  Service: Neurosurgery;;  . APPLICATION OF ROBOTIC ASSISTANCE  FOR SPINAL PROCEDURE N/A 07/26/2017   Procedure: APPLICATION OF ROBOTIC ASSISTANCE FOR SPINAL PROCEDURE;  Surgeon: Ditty, Kevan Ny, MD;  Location: [facility];  Service: Neurosurgery;  Laterality: N/A;  . APPLICATION OF ROBOTIC ASSISTANCE FOR SPINAL PROCEDURE N/A 09/02/2017   Procedure: APPLICATION OF ROBOTIC ASSISTANCE FOR SPINAL PROCEDURE;  Surgeon: Ditty, Kevan Ny, MD;  Location: [facility];  Service: Neurosurgery;  Laterality: N/A;  . BACK SURGERY    . BLADDER SUSPENSION  1991   tack  .  INGUINAL HERNIA REPAIR Right 1991  . LUMBAR FUSION  2016; 2017; 07/26/2017   L4-5; L2-3; L5-S1  . POSTERIOR LUMBAR FUSION 4 WITH HARDWARE REMOVAL N/A 09/02/2017   Procedure: Lumbar three-four redo laminectomy; Repositioning of Left Sacral two screw; Extension of lumbar fusion to Thoracic twelve;  Surgeon: Ditty, Kevan Ny, MD;  Location: [facility];  Service: Neurosurgery;  Laterality: N/A;  . TONSILLECTOMY AND ADENOIDECTOMY  1959  . TUMOR EXCISION     WERTHIN'S TUMORS BOTH SIDES OF NECK  . VAGINAL HYSTERECTOMY  1979    Assessment & Plan Clinical Impression: Patient is a 66 y.o. year old right handed femalewith history of diabetes mellitus, chronic back pain as well as multiple prior thoracic or lumbar surgeries/neurogenic bladder with catheterization 3 times daily maintained on Urecholine and Flomax and received inpatient rehabilitation services 07/28/2017-08/19/2017 after multilevel lumbar laminectomy with lateral fixation.History taken from chart review and patient.She was discharged home with spouse. Supervision for wheelchair to mat transfers. She was standing in the standing frame predominantly using her wheelchair at home.Her husband works but can assist as needed as well as a daughter.Presented 08/29/2017 with nausea, vomiting, abdominal pain and frequent bowel movements. CT abdomen pelvis revealed distended rectum by stool. Also noted L3 body fracture with compression and posterior displacement. Bowel program was regulated. Neurosurgery follow-up underwent lumbar 3-4 redo laminectomy repositioning of lateral sacral screw extension of lumbar fusion to thoracic 12 09/02/2017 per Dr.Ditty. Hospital course pain management. Patient required wound VAC and Hemovac. Lumbar corset when out of bed applied in sitting position. Treated for Escherichia coli UTI with Rocephin completed 09/05/2017. Acute on chronic anemia 7.0 and monitored.    Patient transferred to CIR on 09/07/2017 .    Patient  currently requires min A for scoot pivot transfers, unable to stand due to dizziness and mod A for LB bathing and dressing with basic self-care skills secondary to muscle weakness, decreased cardiorespiratoy endurance, impaired timing and sequencing, abnormal tone, unbalanced muscle activation, decreased coordination and decreased motor planning and decreased sitting balance, decreased standing balance and decreased balance strategies.  Prior to hospitalization, patient could complete ADL with supervision.  Patient will benefit from skilled intervention to decrease level of assist with basic self-care skills and increase independence with basic self-care skills prior to discharge home with care partner.  Anticipate patient will require intermittent supervision and follow up outpatient.  OT - End of Session Activity Tolerance: Tolerates 10 - 20 min activity with multiple rests Endurance Deficit: Yes OT Assessment Rehab Potential (ACUTE ONLY): Good OT Patient demonstrates impairments in the following area(s): Balance;Endurance;Edema;Motor;Pain;Safety;Sensory OT Basic ADL's Functional Problem(s): Grooming;Bathing;Dressing;Toileting OT Transfers Functional Problem(s): Toilet;Tub/Shower OT Additional Impairment(s): None OT Plan OT Intensity: Minimum of 1-2 x/day, 45 to 90 minutes OT Frequency: 5 out of 7 days OT Duration/Estimated Length of Stay: 15- 19 days OT Treatment/Interventions: Medical illustrator training;Community reintegration;Discharge planning;DME/adaptive equipment instruction;Functional electrical stimulation;Psychosocial support;Therapeutic Exercise;Therapeutic Activities;Functional mobility training;Self Care/advanced ADL retraining;Pain management;Patient/family education;Skin care/wound managment;UE/LE Strength taining/ROM;UE/LE Coordination activities;Wheelchair propulsion/positioning;Neuromuscular re-education OT  Self Feeding Anticipated Outcome(s): n/a OT Basic Self-Care  Anticipated Outcome(s): supervision to min a OT Toileting Anticipated Outcome(s): min A  OT Bathroom Transfers Anticipated Outcome(s): min A  OT Recommendation Patient destination: Home Follow Up Recommendations: Outpatient OT Equipment Recommended: To be determined   Skilled Therapeutic Intervention OT eval initiated with pt with reviewed education on Ot purpose, role and goals. Pt reports her husband has not brought in her clothes/ shoes yet. Participated in bathing at bed level due pt reports dizziness with being upright "too fast" since she has been in the bed so much.  Performed all tasks in session allowing for more time.  TEDS and Ace wraps applied to assist with feelings of dizziness related ot BP. Pt came to EOB with mod A and needed to sit EOB for a few min before transitioning to w/c.  Min A scoot pivot transfer into w/c to complete grooming at sink. Pt able to propel her w/c down to the gym from her room with rest breaks often; addressing cardiovascular endurance. Pt also discussed she was able to park her scooter at the doorway of the bathroom and ambulate a couple steps into the bathroom to access shower and toilet (in her "small bathroom"). Car transfer for community integration with min A with instructional cues. PT unable to perform it as she had in the past - to put one foot in the car and then stand pivot into the car- needed today to scoot pivot into the car.   Returned to the room to rest with her family.    OT Evaluation Precautions/Restrictions  Precautions Precautions: Back;Fall Precaution Booklet Issued: No Required Braces or Orthoses: Spinal Brace Spinal Brace: Applied in sitting position;Thoracolumbosacral orthotic Other Brace/Splint: AFO RLE (not present during eval) Restrictions Weight Bearing Restrictions: No General Chart Reviewed: Yes Family/Caregiver Present: Yes(daughter came in session)   Pain Pain Assessment Pain Assessment: No/denies pain Pain  Score: 0-No pain Home Living/Prior Functioning Home Living Available Help at Discharge: Family, Available 24 hours/day Type of Home: House Home Layout: One level Bathroom Shower/Tub: Tub/shower unit, Door, Architectural technologist: Standard  Lives With: Spouse ADL ADL ADL Comments: see functional navigator Vision Baseline Vision/History: Wears glasses Wears Glasses: At all times Patient Visual Report: No change from baseline Vision Assessment?: No apparent visual deficits Perception  Perception: Within Functional Limits Praxis Praxis: Intact Cognition Overall Cognitive Status: Within Functional Limits for tasks assessed Arousal/Alertness: Awake/alert Orientation Level: Person;Place;Situation Person: Oriented Place: Oriented Situation: Oriented Year: 2018 Month: December Day of Week: Correct Memory: Appears intact Immediate Memory Recall: Sock;Blue;Bed Memory Recall: Blue;Sock;Bed Memory Recall Sock: Without Cue Memory Recall Blue: Without Cue Memory Recall Bed: Without Cue Attention: Sustained Sustained Attention: Appears intact Awareness: Appears intact Problem Solving: Appears intact Safety/Judgment: Appears intact Sensation Sensation Light Touch: Impaired Detail Light Touch Impaired Details: Absent RLE;Impaired LLE Proprioception: Impaired Detail Proprioception Impaired Details: Impaired LLE;Impaired RLE Additional Comments: pt with deep touch sensation in RLE Coordination Gross Motor Movements are Fluid and Coordinated: No Fine Motor Movements are Fluid and Coordinated: Yes Motor  Motor Motor: Paraplegia Motor - Skilled Clinical Observations: , paraparesis RLE>LLE; clonus present in right LE- reports worse now Mobility  Bed Mobility Supine to Sit: 3: Mod assist Transfers Transfers: Not assessed(unable to due to feeling dizzines)  Trunk/Postural Assessment  Cervical Assessment Cervical Assessment: Within Functional Limits Thoracic Assessment Thoracic  Assessment: Within Functional Limits Lumbar Assessment Lumbar Assessment: (orthotic) Postural Control Postural Control: Deficits on evaluation Trunk Control: fair - would loose balance  backwards and require A to come back to midline  Balance Dynamic Sitting Balance Dynamic Sitting - Level of Assistance: 4: Min assist Dynamic Standing Balance Dynamic Standing - Comments: unable to due to reported dizziness- ACE wraps and TEDS donned Extremity/Trunk Assessment RUE Assessment RUE Assessment: Within Functional Limits LUE Assessment LUE Assessment: Within Functional Limits   See Function Navigator for Current Functional Status.   Refer to Care Plan for Long Term Goals  Recommendations for other services: None    Discharge Criteria: Patient will be discharged from OT if patient refuses treatment 3 consecutive times without medical reason, if treatment goals not met, if there is a change in medical status, if patient makes no progress towards goals or if patient is discharged from hospital.  The above assessment, treatment plan, treatment alternatives and goals were discussed and mutually agreed upon: by patient  Nicoletta Ba 09/08/2017, 11:25 AM

## 2017-09-08 NOTE — Progress Notes (Signed)
Pt dropped two drinks from left hand, no drift noted, explained may be medication induced, no tingle, no numbness, speech clear. Called on call made aware and agreed medication induced. Will discuss with pt.

## 2017-09-08 NOTE — Progress Notes (Signed)
Physical Therapy Session Note  Patient Details  Name: Megan [name] MRN: [id number] Date of Birth: [birth year]  Today's Date: 09/08/2017 PT Individual Time: 1610-1635 PT Individual Time Calculation (min): 25 min   Short Term Goals: Week 1:  PT Short Term Goal 1 (Week 1): Pt will transfer with min A from bed to chair. PT Short Term Goal 2 (Week 1): Standing and ambulation to be assessed.  Skilled Therapeutic Interventions/Progress Updates:  No c/o pain but BP assessed in supine and 115/44.  Prior PT reporting pt with dizziness at rest.  Session focus on education and LE therex.  Discussed pt level of function at home since d/c on 12/7.  PT instructed pt in BLE therex x10-15 reps for bridges, hip add from hook lying position, and quad sets.  Pt left positioned to comfort with call bell in reach and needs met.   Therapy Documentation Precautions:  Precautions Precautions: Fall, Back Precaution Booklet Issued: No Required Braces or Orthoses: Spinal Brace Spinal Brace: Thoracolumbosacral orthotic, Applied in sitting position Other Brace/Splint: AFO RLE (not present during eval) Restrictions Weight Bearing Restrictions: No   See Function Navigator for Current Functional Status.   Therapy/Group: Individual Therapy  Michel Santee 09/08/2017, 4:51 PM

## 2017-09-08 NOTE — Progress Notes (Signed)
Leak alarm going off on wound vac. Unable to find leak. Called tech support for machine and still not able to discern specific leak site. Wound vac discontinued and a dry dressing applied. Wound well approximated with minimal bloody drainage noted.

## 2017-09-08 NOTE — Progress Notes (Signed)
Physical Therapy Note  Patient Details  Name: Megan [name] MRN: [id number] Date of Birth: August 15, 1951 Today's Date: 09/08/2017  1535-1605,  30 min individual tx Pain: none per pt  Supine with HOB raised BP = 98/49 , HR= 72. Pt wearing thigh high TEDS ; PT rewrapped ACES bil LLs. neuromuscular re-education via multimodal cues, active assistance PRN and demo for 10 x 1 each R/L hip abd/adduction, L hip internal/external rotation, 10 x 2 ankle pumps.  PROM R hip in all planes.  Pt with palpable muscle spasm R> L hip adductors.  R ankle clonus, sustained,  with R ankle PROM.  Pt left resting in bed; [name], PT entered for next session.   See function navigator for current status.  [name] 09/08/2017, 3:48 PM

## 2017-09-08 NOTE — Progress Notes (Signed)
[name], MD  Physician  Physical Medicine and Rehabilitation  Consult Note  Signed  Date of Service:  09/05/2017 6:26 AM       Related encounter: ED to Hosp-Admission (Discharged) from 08/29/2017 in [facility]      Signed      Expand All Collapse All       [] Hide copied text  [] Hover for details        Physical Medicine and Rehabilitation Consult Reason for Consult: Decreased functional mobility Referring Physician: Dr. Cyndy Freeze   HPI: Megan [name] is a 66 y.o. right handed female with history of diabetes mellitus, as well as multiple prior thoracic or lumbar surgeries and received inpatient rehabilitation services 07/28/2017-08/19/2017 after multilevel lumbar laminectomy with lateral fixation. History taken from chart review and patient. She was discharged home with spouse. Supervision for wheelchair to mat transfers. She was standing in the standing frame predominantly using her wheelchair at home. Her husband works but can assist as needed as well as a daughter. Presented 08/29/2017 with nausea, vomiting, abdominal pain and frequent bowel movements. CT abdomen pelvis revealed distended rectum by stool. Also noted L3 body fracture with compression and posterior displacement. Bowel program was regulated. Neurosurgery follow-up underwent lumbar 3-4 redo laminectomy repositioning of lateral sacral screw extension of lumbar fusion to thoracic 12 09/02/2017 per Dr. Cyndy Freeze. Hospital course pain management. Patient required wound VAC and Hemovac. Lumbar corset when out of bed applied in sitting position. Treated for Escherichia coli UTI maintained on Rocephin. Acute on chronic anemia 7.1 and monitored. Physical therapy evaluation completed with recommendations of physical medicine rehabilitation consult.   Review of Systems  Constitutional: Negative for chills and fever.  HENT: Negative for hearing loss.   Eyes: Negative for blurred vision  and double vision.  Respiratory: Negative for cough and shortness of breath.   Cardiovascular: Positive for leg swelling. Negative for chest pain and palpitations.  Gastrointestinal: Positive for constipation, nausea and vomiting.       GERD  Genitourinary:       Urge incontinence  Musculoskeletal: Positive for back pain and myalgias.  Skin: Negative for rash.  Neurological: Positive for sensory change and weakness.  All other systems reviewed and are negative.      Past Medical History:  Diagnosis Date  . Arthritis    "hands, back" (07/26/2017)  . Dyspnea    W/ PHYS CONDITION   . GERD (gastroesophageal reflux disease)   . History of bronchitis    "not since I quit smoking" (07/26/2017)  . History of kidney stones   . History of shingles   . Hyperlipidemia    takes Fish Oil daily  . Hypertension   . Neuromuscular disorder (HCC)    tingling toes  . 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 Pneumonia 2009  . PONV (postoperative nausea and vomiting)   . Restless leg   . Type 2 diabetes mellitus ([place])   . Uterine cancer ([place]) 1979   S/P hysterectomy  . Weakness    numbness and tingling in both feet r/t back        Past Surgical History:  Procedure Laterality Date  . ABDOMINAL EXPOSURE N/A 07/26/2017   Procedure: ABDOMINAL EXPOSURE;  Surgeon: Angelia Mould, MD;  Location: [facility];  Service: Vascular;  Laterality: N/A;  . ANTERIOR CERVICAL DECOMP/DISCECTOMY FUSION  2001  . ANTERIOR LUMBAR FUSION N/A 07/26/2017   Procedure: Lumbar five-Sacral one Anterior lumbar interbody fusion with Dr.  Deitra Mayo for approach;  Surgeon: Ditty, Kevan Ny, MD;  Location: [facility];  Service: Neurosurgery;  Laterality: N/A;  . APPLICATION OF ROBOTIC ASSISTANCE FOR SPINAL PROCEDURE  12/19/2016   Procedure: APPLICATION OF ROBOTIC ASSISTANCE FOR SPINAL PROCEDURE;  Surgeon: Kevan Ny Ditty, MD;  Location: [facility];  Service: Neurosurgery;;  .  APPLICATION OF ROBOTIC ASSISTANCE FOR SPINAL PROCEDURE N/A 07/26/2017   Procedure: APPLICATION OF ROBOTIC ASSISTANCE FOR SPINAL PROCEDURE;  Surgeon: Ditty, Kevan Ny, MD;  Location: [facility];  Service: Neurosurgery;  Laterality: N/A;  . BACK SURGERY    . BLADDER SUSPENSION  1991   tack  . INGUINAL HERNIA REPAIR Right 1991  . LUMBAR FUSION  2016; 2017; 07/26/2017   L4-5; L2-3; L5-S1  . [place]  . TUMOR EXCISION     WERTHIN'S TUMORS BOTH SIDES OF NECK  . VAGINAL HYSTERECTOMY  1979        Family History  Problem Relation Age of Onset  . Diabetes Mother   . Dementia Mother   . Cirrhosis Mother        Non alcoholic  . COPD Father   . Diabetes Brother    Social History:  reports that she quit smoking about 11 years ago. Her smoking use included cigarettes. She has a 55.50 pack-year smoking history. she has never used smokeless tobacco. She reports that she drinks alcohol. She reports that she does not use drugs. Allergies:       Allergies  Allergen Reactions  . Shellfish Allergy Anaphylaxis, Swelling and Other (See Comments)    Tongue swells  . Oxycodone Nausea Only  . Penicillins Rash and Other (See Comments)    Has patient had a PCN reaction causing immediate rash, facial/tongue/throat swelling, SOB or lightheadedness with hypotension: Yes Has patient had a PCN reaction causing severe rash involving mucus membranes or skin necrosis: No Has patient had a PCN reaction that required hospitalization No Has patient had a PCN reaction occurring within the last 10 years: No If all of the above answers are "NO", then may proceed with Cephalosporin use.         Medications Prior to Admission  Medication Sig Dispense Refill  . atorvastatin (LIPITOR) 20 MG tablet Take 1 tablet (20 mg total) by mouth every evening. 90 tablet 3  . bethanechol (URECHOLINE) 5 MG tablet Take 5 mg by mouth 3 (three) times daily.  0  . Biotin 5000 MCG  CAPS Take 5,000 mcg by mouth every morning.     . celecoxib (CELEBREX) 200 MG capsule Take 1 capsule (200 mg total) by mouth every 12 (twelve) hours. 60 capsule 0  . cholecalciferol (VITAMIN D) 1000 units tablet Take 2 tablets (2,000 Units total) by mouth daily after lunch. (Patient taking differently: Take 1,000 Units by mouth daily after lunch. ) 30 tablet 0  . Coenzyme Q10 (COQ10) 100 MG CAPS Take 100 mg daily by mouth.     . esomeprazole (NEXIUM 24HR) 20 MG capsule Take 1 capsule (20 mg total) by mouth daily at 12 noon. 30 capsule 0  . gabapentin (NEURONTIN) 300 MG capsule Take 1 capsule (300 mg total) by mouth 3 (three) times daily. 90 capsule 2  . Garlic 412 MG CAPS Take 500 mg 2 (two) times daily by mouth.    Marland Kitchen HYDROcodone-acetaminophen (NORCO/VICODIN) 5-325 MG tablet Take 1-2 tablets by mouth every 6 (six) hours as needed for moderate pain. (Patient taking differently: Take 1 tablet by mouth every 6 (six) hours  as needed for moderate pain. ) 20 tablet 0  . lisinopril (PRINIVIL,ZESTRIL) 5 MG tablet Take 1 tablet (5 mg total) by mouth daily. 30 tablet 0  . metFORMIN (GLUCOPHAGE) 500 MG tablet Take 1 tablet (500 mg total) by mouth 2 (two) times daily with a meal. 180 tablet 2  . senna-docusate (SENOKOT-S) 8.6-50 MG tablet Take 2 tablets by mouth 2 (two) times daily. (Patient taking differently: Take 1-2 tablets by mouth daily as needed for mild constipation. )    . tamsulosin (FLOMAX) 0.4 MG CAPS capsule Take 1 capsule (0.4 mg total) by mouth daily after supper. 30 capsule 0  . tiZANidine (ZANAFLEX) 2 MG tablet Take 1 tablet (2 mg total) by mouth 3 (three) times daily. 90 tablet 0  . bisacodyl (DULCOLAX) 5 MG EC tablet Take 1 tablet (5 mg total) by mouth daily as needed for moderate constipation. (Patient not taking: Reported on 08/29/2017) 30 tablet 0  . polyethylene glycol (MIRALAX / GLYCOLAX) packet Take 17 g by mouth daily. (Patient taking differently: Take 17 g by mouth daily as  needed for mild constipation. ) 14 each 0    Home: Home Living Family/patient expects to be discharged to:: Inpatient rehab Living Arrangements: Spouse/significant other Available Help at Discharge: Family, Available 24 hours/day Type of Home: House Home Access: Ramped entrance Home Layout: One level Bathroom Shower/Tub: Tub/shower unit, Door, Architectural technologist: Standard Bathroom Accessibility: Yes Home Equipment: Civil engineer, contracting, Environmental consultant - 2 wheels, Cane - single point, Environmental consultant - 4 wheels, Grab bars - tub/shower Additional Comments: Spouse works but runs his own company and can come and go as needed.   Lives With: Spouse  Functional History: Prior Function Level of Independence: Needs assistance Gait / Transfers Assistance Needed: Pt recently d/ced from CIR on Dec 7 and only walking 7 feet with w/c follow. Has not walked since being home. Transfers with assist.  ADL's / Homemaking Assistance Needed: Husband assists with ADLs and does IADLs. Pt reports using depends at home and not transferring to toilet. Comments: Has scooter for mobility.  Functional Status:  Mobility: Bed Mobility Overal bed mobility: Needs Assistance Bed Mobility: Rolling, Sidelying to Sit, Sit to Sidelying Rolling: Mod assist Sidelying to sit: Max assist, +2 for physical assistance, HOB elevated Sit to sidelying: +2 for physical assistance, Mod assist General bed mobility comments: cues for sequencing and technique; assist to bring R LE to EOB and to elevate trunk into sitting; assist to bring bilat LE and lower trunk when returning to supine Transfers Overall transfer level: Needs assistance Equipment used: Rolling walker (2 wheeled) Transfers: Sit to/from Stand Sit to Stand: Max assist, +2 physical assistance, From elevated surface General transfer comment: cues for safe hand placement and positioning prior to stand; pt was able to stand for brief periods no longer than 30 seconds with max A +2 to power  up and maintain balance; R LE weakness and R knee buckling if not assisted to brace; lateral scoot attempt to recliner deferred as pt began feeling like she would "pass out"   ADL: ADL Overall ADL's : Needs assistance/impaired Eating/Feeding: Set up, Bed level Grooming: Wash/dry hands, Wash/dry face, Brushing hair, Set up, Bed level Upper Body Bathing: Moderate assistance, Bed level Lower Body Bathing: Total assistance, Bed level Upper Body Dressing : Bed level, Maximal assistance Lower Body Dressing: Total assistance, Bed level General ADL Comments: Pt set EOB with max assist support for balance. Became very nauseous sitting EOB and unable to tolerate OOB transfer.  Cognition: Cognition Overall Cognitive Status: Within Functional Limits for tasks assessed Orientation Level: Oriented X4 Cognition Arousal/Alertness: Awake/alert Behavior During Therapy: WFL for tasks assessed/performed Overall Cognitive Status: Within Functional Limits for tasks assessed  Blood pressure (!) 118/56, pulse 79, temperature 99.1 F (37.3 C), temperature source Oral, resp. rate 16, height 5\' 8"  (1.727 m), weight 72.6 kg (160 lb), SpO2 93 %. Physical Exam  Vitals reviewed. Constitutional: She is oriented to person, place, and time. She appears well-developed and well-nourished.  HENT:  Head: Normocephalic and atraumatic.  Eyes: EOM are normal. Right eye exhibits no discharge. Left eye exhibits no discharge.  Neck: Normal range of motion. Neck supple. No thyromegaly present.  Cardiovascular: Normal rate, regular rhythm and normal heart sounds.  Respiratory: Effort normal and breath sounds normal. No respiratory distress.  GI: Soft. Bowel sounds are normal. She exhibits no distension.  Musculoskeletal:  No edema or tenderness in extremities  Neurological: She is alert and oriented to person, place, and time.  Motor: B/l UE 4+/5 proximal to distal B/l LE: 4/5 proximal to distal RLE: HF, KE 0/5,  ADF/PF 4/5 Sensation diminished to light touch medial RLE  Skin: Skin is warm and dry.  Back incision is dressed +Drain  Psychiatric: She has a normal mood and affect. Her behavior is normal.             Assessment/Plan: Diagnosis: Debility Labs independently reviewed.  Records reviewed and summated above.  1. Does the need for close, 24 hr/day medical supervision in concert with the patient's rehab needs make it unreasonable for this patient to be served in a less intensive setting? Yes  2. Co-Morbidities requiring supervision/potential complications:  Acute on chronic anemia (transfuse if necessary to ensure appropriate perfusion for increased activity tolerance), UTI (cont abx), post-op pain management on chronic pain (Biofeedback training with therapies to help reduce reliance on opiate pain medications, monitor pain control during therapies, and sedation at rest and titrate to maximum efficacy to ensure participation and gains in therapies), diabetes mellitus (Monitor in accordance with exercise and adjust meds as necessary), multiple prior thoracic or lumbar surgeries, ID (D/c IV Vanc when appropriate), hypokalemia (continue to monitor and replete as necessary) 3. Due to bladder management, bowel management, safety, skin/wound care, disease management, pain management and patient education, does the patient require 24 hr/day rehab nursing? Yes 4. Does the patient require coordinated care of a physician, rehab nurse, PT (1-2 hrs/day, 5 days/week) and OT (1-2 hrs/day, 5 days/week) to address physical and functional deficits in the context of the above medical diagnosis(es)? Yes Addressing deficits in the following areas: balance, endurance, locomotion, strength, transferring, bathing, dressing, toileting and psychosocial support 5. Can the patient actively participate in an intensive therapy program of at least 3 hrs of therapy per day at least 5 days per week? Yes 6. The potential for  patient to make measurable gains while on inpatient rehab is excellent 7. Anticipated functional outcomes upon discharge from inpatient rehab are min assist  with PT, min assist with OT, min assist with SLP. 8. Estimated rehab length of stay to reach the above functional goals is: 18-22 days. 9. Anticipated D/C setting: Home 10. Anticipated post D/C treatments: HH therapy and Home excercise program 11. Overall Rehab/Functional Prognosis: good  RECOMMENDATIONS: This patient's condition is appropriate for continued rehabilitative care in the following setting: CIR when medically appropriate Patient has agreed to participate in recommended program. Yes Note that insurance prior authorization may be required for reimbursement for recommended  care.  Comment: Rehab Admissions Coordinator to follow up.  Delice Lesch, MD, ABPMR 09/03/17 Lavon Paganini Angiulli, PA-C 09/05/2017          Revision History                        Routing History

## 2017-09-08 NOTE — Evaluation (Signed)
Physical Therapy Assessment and Plan  Patient Details  Name: Megan [name] MRN: [id number] Date of Birth: 02-Nov-1950  PT Diagnosis: Abnormality of gait, Difficulty walking, Impaired sensation, Low back pain and Muscle weakness Rehab Potential: Good ELOS: 15 to 19 days(15 to 19 days)   Today's Date: 09/08/2017 PT Individual Time: 9924-2683 PT Individual Time Calculation (min): 55 min    Problem List:  Patient Active Problem List   Diagnosis Date Noted  . Debility   . S/P fusion of thoracic spine   . Anemia of chronic disease   . Chronic pain syndrome   . Hypokalemia   . Acute lower UTI 09/04/2017  . L3 vertebral fracture ([place]) 09/01/2017  . Constipation due to pain medication   . Muscle spasms of both lower extremities   . Type 2 diabetes mellitus with peripheral neuropathy (HCC)   . Hypoalbuminemia due to protein-calorie malnutrition ([place])   . Radiculopathy 07/28/2017  . Fusion of spine, lumbosacral region   . Acute blood loss anemia   . Benign essential HTN   . Diabetes mellitus type 2 in obese ([place])   . Lumbosacral spondylosis with radiculopathy 07/26/2017  . Sensation of pressure in bladder area 01/31/2017  . Paraplegia, incomplete ([place]) 01/07/2017  . Neurogenic bladder 12/27/2016  . 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/27/2016  . Myelopathy ([place]) 12/21/2016  . Surgery, elective   . Diabetes mellitus type 2 in nonobese (HCC)   . History of lumbar fusion   . History of fusion of cervical spine   . Neuropathic pain   . Post-operative pain   . 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 Paraparesis ([place]) 12/15/2016  . Pedal edema 12/14/2016  . Type 2 diabetes mellitus without complication, without long-term current use of insulin ([place]) 11/02/2016  . Hyperlipidemia 11/02/2016  . Essential hypertension 11/02/2016  . 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    Past Medical History:  Past Medical  History:  Diagnosis Date  . Arthritis    "hands, back" (07/26/2017)  . Dyspnea    W/ PHYS CONDITION   . GERD (gastroesophageal reflux disease)   . History of bronchitis    "not since I quit smoking" (07/26/2017)  . History of kidney stones   . History of shingles   . Hyperlipidemia    takes Fish Oil daily  . Hypertension   . Neuromuscular disorder (HCC)    tingling toes  . 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[place]) 12/15/2016  . Pneumonia 2009  . PONV (postoperative nausea and vomiting)   . Restless leg   . Type 2 diabetes mellitus ([place])   . Uterine cancer ([place]) 1979   S/P hysterectomy  . Weakness    numbness and tingling in both feet r/t back   Past Surgical History:  Past Surgical History:  Procedure Laterality Date  . ABDOMINAL EXPOSURE N/A 07/26/2017   Procedure: ABDOMINAL EXPOSURE;  Surgeon: Angelia Mould, MD;  Location: [place];  Service: Vascular;  Laterality: N/A;  . ANTERIOR CERVICAL DECOMP/DISCECTOMY FUSION  2001  . ANTERIOR LUMBAR FUSION N/A 07/26/2017   Procedure: Lumbar five-Sacral one Anterior lumbar interbody fusion with Dr. Deitra Mayo for approach;  Surgeon: Ditty, Kevan Ny, MD;  Location: [facility];  Service: Neurosurgery;  Laterality: N/A;  . APPLICATION OF ROBOTIC ASSISTANCE FOR SPINAL PROCEDURE  12/19/2016   Procedure: APPLICATION OF ROBOTIC ASSISTANCE FOR SPINAL PROCEDURE;  Surgeon: Kevan Ny Ditty, MD;  Location: [place];  Service: Neurosurgery;;  .  APPLICATION OF ROBOTIC ASSISTANCE FOR SPINAL PROCEDURE N/A 07/26/2017   Procedure: APPLICATION OF ROBOTIC ASSISTANCE FOR SPINAL PROCEDURE;  Surgeon: Ditty, Kevan Ny, MD;  Location: [place];  Service: Neurosurgery;  Laterality: N/A;  . APPLICATION OF ROBOTIC ASSISTANCE FOR SPINAL PROCEDURE N/A 09/02/2017   Procedure: APPLICATION OF ROBOTIC ASSISTANCE FOR SPINAL PROCEDURE;  Surgeon: Ditty, Kevan Ny, MD;  Location: [facility];  Service: Neurosurgery;  Laterality: N/A;  . BACK SURGERY    .  BLADDER SUSPENSION  1991   tack  . INGUINAL HERNIA REPAIR Right 1991  . LUMBAR FUSION  2016; 2017; 07/26/2017   L4-5; L2-3; L5-S1  . POSTERIOR LUMBAR FUSION 4 WITH HARDWARE REMOVAL N/A 09/02/2017   Procedure: Lumbar three-four redo laminectomy; Repositioning of Left Sacral two screw; Extension of lumbar fusion to Thoracic twelve;  Surgeon: Ditty, Kevan Ny, MD;  Location: [place];  Service: Neurosurgery;  Laterality: N/A;  . TONSILLECTOMY AND ADENOIDECTOMY  1959  . TUMOR EXCISION     WERTHIN'S TUMORS BOTH SIDES OF NECK  . VAGINAL HYSTERECTOMY  1979    Assessment & Plan Clinical Impression: Patient is a 66 y.o. year old female with recent admission to the hospital on Aug 29, 2017 with history of diabetes mellitus, chronic back pain as well as multiple prior thoracic or lumbar surgeries/neurogenic bladder with catheterization 3 times daily maintained on Urecholine and Flomax and received inpatient rehabilitation services 07/28/2017-08/19/2017 after multilevel lumbar laminectomy with lateral fixation.History taken from chart review and patient.She was discharged home with spouse. Supervision for wheelchair to mat transfers. She was standing in the standing frame predominantly using her wheelchair at home.Her husband works but can assist as needed as well as a daughter.Presented 08/29/2017 with nausea, vomiting, abdominal pain and frequent bowel movements. CT abdomen pelvis revealed distended rectum by stool. Also noted L3 body fracture with compression and posterior displacement. Bowel program was regulated. Neurosurgery follow-up underwent lumbar 3-4 redo laminectomy repositioning of lateral sacral screw extension of lumbar fusion to thoracic 12 09/02/2017 per Dr.Ditty. Hospital course pain management. Patient required wound VAC and Hemovac. Lumbar corset when out of bed applied in sitting position. Treated for Escherichia coli UTI with Rocephin completed 09/05/2017. Acute on chronic anemia 7.0  and monitored. Physical and occupational therapy evaluations completed with recommendations of physical medicine rehabilitation consult. Patient was admitted for a comprehensive rehabilitation program  Patient transferred to CIR on 09/07/2017 .   Patient currently requires mod with mobility secondary to muscle weakness and muscle paralysis, decreased cardiorespiratoy endurance, abnormal tone and decreased sitting balance, decreased postural control, decreased balance strategies and difficulty maintaining precautions.  Prior to hospitalization, patient was min with mobility and lived with Spouse in a House home.  Home access is  Ramped entrance.  Patient will benefit from skilled PT intervention to maximize safe functional mobility, minimize fall risk and decrease caregiver burden for planned discharge home with 24 hour assist.  Anticipate patient will benefit from follow up [facility] at discharge.  PT - End of Session Activity Tolerance: Tolerates 10 - 20 min activity with multiple rests Endurance Deficit: Yes Endurance Deficit Description: BP decreased during session with TEDS donned as per conversation with Dr Read Drivers.   PT Assessment Rehab Potential (ACUTE/IP ONLY): Good PT Barriers to Discharge: Decreased caregiver support PT Patient demonstrates impairments in the following area(s): Balance;Pain;Endurance;Motor;Sensory PT Transfers Functional Problem(s): Bed Mobility;Bed to Chair;Car;Furniture PT Locomotion Functional Problem(s): Ambulation;Wheelchair Mobility;Stairs PT Plan PT Intensity: Minimum of 1-2 x/day ,45 to 90 minutes PT Frequency: 5 out of  7 days PT Duration Estimated Length of Stay: 15 to 19 days(15 to 19 days) PT Treatment/Interventions: Ambulation/gait training;Functional mobility training;Discharge planning;DME/adaptive equipment instruction;Pain management;Psychosocial support;Therapeutic Activities;UE/LE Strength taining/ROM;Wheelchair propulsion/positioning;UE/LE Coordination  activities;Therapeutic Exercise;Neuromuscular re-education;Disease management/prevention;Community reintegration;Balance/vestibular training PT Transfers Anticipated Outcome(s): Supervision to min A PT Locomotion Anticipated Outcome(s): Mod I w/c; Ambulation TBD PT Recommendation Follow Up Recommendations: Home health PT;24 hour supervision/assistance Patient destination: Home Equipment Recommended: To be determined  Skilled Therapeutic Intervention Session today focused on pt and family education on expected course in rehab as well as tolerance to upright positioning.  Initial BP in supine:  155/56.  Per Dr Read Drivers (verbal), okay to utilize TEDS so b/l thigh high TEDS donned.  Pt then sat EOB with mod assist and donned TLSO in sitting with min assist.  Pt transferred to w/c by scooting and min assist.  Following return to w/c, pt reports increased lightheadedness.  BP: 109/47.  Pt returned to bed with min A (scoot) again and mod to max A for sit to supine.  BP re-taken and was: 145/59.  Dizziness resolving per pt reports. Nursing notified and pt left in bed with 3 rails up, husband in room, and call bell in reach.    PT Evaluation Precautions/Restrictions Precautions Precautions: Fall;Back Precaution Booklet Issued: No Required Braces or Orthoses: Spinal Brace Spinal Brace: Thoracolumbosacral orthotic;Applied in sitting position Restrictions Weight Bearing Restrictions: No General Chart Reviewed: Yes Vital SignsTherapy Vitals Temp: 98.1 F (36.7 C) Temp Source: Oral Pulse Rate: 80 Resp: 18 BP: (!) 113/49 Patient Position (if appropriate): Lying Oxygen Therapy SpO2: 99 % O2 Device: Not Delivered   Home Living/Prior Functioning Home Living Available Help at Discharge: Available 24 hours/day Type of Home: House Home Access: Ramped entrance Home Layout: One level  Lives With: Spouse Prior Function Level of Independence: Needs assistance with tranfers;Needs assistance with  gait;Needs assistance with homemaking;Needs assistance with ADLs Comments: (Has scooter for independent mobility) Vision/Perception :  No change from baseline.    Cognition Overall Cognitive Status: Within Functional Limits for tasks assessed Arousal/Alertness: Awake/alert Orientation Level: Oriented X4 Memory: Appears intact Awareness: Appears intact Problem Solving: Appears intact Safety/Judgment: Appears intact Sensation Sensation Light Touch: Impaired Detail(Absent to light touch in R LE) Coordination Gross Motor Movements are Fluid and Coordinated: No Fine Motor Movements are Fluid and Coordinated: Yes Coordination and Movement Description: (paraparesis with increased limitation on R > L LE) Motor  Motor Motor: Paraplegia Motor - Skilled Clinical Observations: (Clonus present in R LE during transfers)  Trunk/Postural Assessment  Cervical Assessment Cervical Assessment: (Not formally assessed due to TLSO present in sitting) Thoracic Assessment Thoracic Assessment: (TLSO in place in sitting - unable to assess) Lumbar Assessment Lumbar Assessment: (TLSO pressent) Postural Control Postural Control: Deficits on evaluation Trunk Control: Loses balance posterior - requires min A for static/dynamic postural changes to prevent loss of balance  Balance Balance Balance Assessed: Yes Static Sitting Balance Static Sitting - Balance Support: Bilateral upper extremity supported;Feet supported Static Sitting - Level of Assistance: 4: Min assist Dynamic Sitting Balance Dynamic Sitting - Balance Support: During functional activity;Feet supported;Bilateral upper extremity supported Dynamic Sitting - Level of Assistance: 4: Min assist Dynamic Sitting Balance - Compensations: donning TLSO in sitting Extremity Assessment  RUE Assessment RUE Assessment: Within Functional Limits(strength not tested but b/l AROM WFL) LUE Assessment LUE Assessment: Within Functional Limits(AROM in B UE WFL  - strength not tested formally) RLE Assessment RLE Assessment: Exceptions to WFL(Increased clonus in R LE;  Pt unable to initiated knee  flexion or ankle DF in supine gravity elimitated position) LLE Assessment LLE Assessment: (Pt able to flex L knee and bring towards chest to attempt to don socks today)   See Function Navigator for Current Functional Status.   Refer to Care Plan for Long Term Goals  Recommendations for other services: Therapeutic Recreation  Pet therapy, Kitchen group and Stress management  Discharge Criteria: Patient will be discharged from PT if patient refuses treatment 3 consecutive times without medical reason, if treatment goals not met, if there is a change in medical status, if patient makes no progress towards goals or if patient is discharged from hospital.  The above assessment, treatment plan, treatment alternatives and goals were discussed and mutually agreed upon: by patient and by family  Emanuell Morina Hilario Quarry 09/08/2017, 3:45 PM

## 2017-09-08 NOTE — Progress Notes (Signed)
Megan Diones, RN  Rehab Admission Coordinator  Physical Medicine and Rehabilitation  Progress Notes  Signed  Date of Service:  09/08/2017 12:10 PM          Signed            [] Hide copied text  [] Hover for details             [name], MD  Physician  Physical Medicine and Rehabilitation  Consult Note    Signed    Date of Service:  09/05/2017 6:26 AM          Related encounter: ED to Hosp-Admission (Discharged) from 08/29/2017 in [facility]            Signed       Expand All Collapse All       [] Hide copied text  [] Hover for details        Physical Medicine and Rehabilitation Consult Reason for Consult:Decreased functional mobility Referring Physician:Dr.Ditty   WCH:ENIDPO [name] a 66 y.o.right handed femalewith history of diabetes mellitus, as well as multiple prior thoracic or lumbar surgeries and received inpatient rehabilitation services 07/28/2017-08/19/2017 after multilevel lumbar laminectomy with lateral fixation.History taken from chart review and patient.She was discharged home with spouse. Supervision for wheelchair to mat transfers. She was standing in the standing frame predominantly using her wheelchair at home.Her husband works but can assist as needed as well as a daughter.Presented 08/29/2017 with nausea, vomiting, abdominal pain and frequent bowel movements. CT abdomen pelvis revealed distended rectum by stool. Also noted L3 body fracture with compression and posterior displacement. Bowel program was regulated. Neurosurgery follow-up underwent lumbar 3-4 redo laminectomy repositioning of lateral sacral screw extension of lumbar fusion to thoracic 12 09/02/2017 per Dr.Ditty. Hospital course pain management. Patient required wound VAC and Hemovac. Lumbar corset when out of bed applied in sitting position. Treated for Escherichia coli UTI maintained on Rocephin. Acute on chronic  anemia 7.1 and monitored. Physical therapy evaluation completed with recommendations of physical medicine rehabilitation consult.   Review of Systems  Constitutional: Negative forchillsand fever.  HENT: Negative forhearing loss.  Eyes: Negative forblurred visionand double vision.  Respiratory: Negative forcoughand shortness of breath.  Cardiovascular: Positive forleg swelling. Negative forchest painand palpitations.  Gastrointestinal: Positive forconstipation,nauseaand vomiting. GERD Genitourinary: Urge incontinence Musculoskeletal: Positive forback painand myalgias.  Skin: Negative forrash.  Neurological: Positive forsensory changeand weakness. All other systems reviewed and are negative.      Past Medical History:  Diagnosis Date  . Arthritis    "hands, back" (07/26/2017)  . Dyspnea    W/ PHYS CONDITION   . GERD (gastroesophageal reflux disease)   . History of bronchitis    "not since I quit smoking" (07/26/2017)  . History of kidney stones   . History of shingles   . Hyperlipidemia    takes Fish Oil daily  . Hypertension   . Neuromuscular disorder (HCC)    tingling toes  . 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[facility]) 12/15/2016  . Pneumonia 2009  . PONV (postoperative nausea and vomiting)   . Restless leg   . Type 2 diabetes mellitus ([facility])   . Uterine cancer ([facility]) 1979   S/P hysterectomy  . Weakness    numbness and tingling in both feet r/t back        Past Surgical History:  Procedure Laterality Date  . ABDOMINAL EXPOSURE N/A 07/26/2017   Procedure: ABDOMINAL EXPOSURE; Surgeon: Angelia Mould, MD; Location: [facility]; Service: Vascular;  Laterality: N/A;  . ANTERIOR CERVICAL DECOMP/DISCECTOMY FUSION  2001  . ANTERIOR LUMBAR FUSION N/A 07/26/2017   Procedure: Lumbar five-Sacral one Anterior lumbar interbody fusion with Dr. Deitra Mayo for approach; Surgeon: Ditty, Kevan Ny, MD;  Location: [facility]; Service: Neurosurgery; Laterality: N/A;  . APPLICATION OF ROBOTIC ASSISTANCE FOR SPINAL PROCEDURE  12/19/2016   Procedure: APPLICATION OF ROBOTIC ASSISTANCE FOR SPINAL PROCEDURE; Surgeon: Kevan Ny Ditty, MD; Location: [facility]; Service: Neurosurgery;;  . APPLICATION OF ROBOTIC ASSISTANCE FOR SPINAL PROCEDURE N/A 07/26/2017   Procedure: APPLICATION OF ROBOTIC ASSISTANCE FOR SPINAL PROCEDURE; Surgeon: Ditty, Kevan Ny, MD; Location: [facility]; Service: Neurosurgery; Laterality: N/A;  . BACK SURGERY    . BLADDER SUSPENSION  1991   tack  . INGUINAL HERNIA REPAIR Right 1991  . LUMBAR FUSION  2016; 2017; 07/26/2017   L4-5; L2-3; L5-S1  . [place]  . TUMOR EXCISION     WERTHIN'S TUMORS BOTH SIDES OF NECK  . VAGINAL HYSTERECTOMY  1979        Family History  Problem Relation Age of Onset  . Diabetes Mother   . Dementia Mother   . Cirrhosis Mother    Non alcoholic  . COPD Father   . Diabetes Brother    Social History:reports that she quit smoking about 11 years ago. Her smoking use included cigarettes. She has a 55.50 pack-year smoking history. she has never used smokeless tobacco. She reports that she drinks alcohol. She reports that she does not use drugs. Allergies:      Allergies  Allergen Reactions  . Shellfish Allergy Anaphylaxis, Swelling and Other (See Comments)    Tongue swells  . Oxycodone Nausea Only  . Penicillins Rash and Other (See Comments)    Has patient had a PCN reaction causing immediate rash, facial/tongue/throat swelling, SOB or lightheadedness with hypotension: Yes Has patient had a PCN reaction causing severe rash involving mucus membranes or skin necrosis: No Has patient had a PCN reaction that required hospitalization No Has patient had a PCN reaction occurring within the last 10 years: No If all of the above answers are "NO", then may proceed with Cephalosporin  use.         Medications Prior to Admission  Medication Sig Dispense Refill  . atorvastatin (LIPITOR) 20 MG tablet Take 1 tablet (20 mg total) by mouth every evening. 90 tablet 3  . bethanechol (URECHOLINE) 5 MG tablet Take 5 mg by mouth 3 (three) times daily.  0  . Biotin 5000 MCG CAPS Take 5,000 mcg by mouth every morning.     . celecoxib (CELEBREX) 200 MG capsule Take 1 capsule (200 mg total) by mouth every 12 (twelve) hours. 60 capsule 0  . cholecalciferol (VITAMIN D) 1000 units tablet Take 2 tablets (2,000 Units total) by mouth daily after lunch. (Patient taking differently: Take 1,000 Units by mouth daily after lunch. ) 30 tablet 0  . Coenzyme Q10 (COQ10) 100 MG CAPS Take 100 mg daily by mouth.     . esomeprazole (NEXIUM 24HR) 20 MG capsule Take 1 capsule (20 mg total) by mouth daily at 12 noon. 30 capsule 0  . gabapentin (NEURONTIN) 300 MG capsule Take 1 capsule (300 mg total) by mouth 3 (three) times daily. 90 capsule 2  . Garlic 073 MG CAPS Take 500 mg 2 (two) times daily by mouth.    Marland Kitchen HYDROcodone-acetaminophen (NORCO/VICODIN) 5-325 MG tablet Take 1-2 tablets by mouth every 6 (six) hours as needed for moderate pain. (Patient  taking differently: Take 1 tablet by mouth every 6 (six) hours as needed for moderate pain. ) 20 tablet 0  . lisinopril (PRINIVIL,ZESTRIL) 5 MG tablet Take 1 tablet (5 mg total) by mouth daily. 30 tablet 0  . metFORMIN (GLUCOPHAGE) 500 MG tablet Take 1 tablet (500 mg total) by mouth 2 (two) times daily with a meal. 180 tablet 2  . senna-docusate (SENOKOT-S) 8.6-50 MG tablet Take 2 tablets by mouth 2 (two) times daily. (Patient taking differently: Take 1-2 tablets by mouth daily as needed for mild constipation. )    . tamsulosin (FLOMAX) 0.4 MG CAPS capsule Take 1 capsule (0.4 mg total) by mouth daily after supper. 30 capsule 0  . tiZANidine (ZANAFLEX) 2 MG tablet Take 1 tablet (2 mg total) by mouth 3 (three) times daily. 90 tablet 0  . bisacodyl  (DULCOLAX) 5 MG EC tablet Take 1 tablet (5 mg total) by mouth daily as needed for moderate constipation. (Patient not taking: Reported on 08/29/2017) 30 tablet 0  . polyethylene glycol (MIRALAX / GLYCOLAX) packet Take 17 g by mouth daily. (Patient taking differently: Take 17 g by mouth daily as needed for mild constipation. ) 14 each 0    Home: Home Living Family/patient expects to be discharged to:: Inpatient rehab Living Arrangements: Spouse/significant other Available Help at Discharge: Family, Available 24 hours/day Type of Home: House Home Access: Ramped entrance Home Layout: One level Bathroom Shower/Tub: Tub/shower unit, Door, Architectural technologist: Standard Bathroom Accessibility: Yes Home Equipment: Civil engineer, contracting, Environmental consultant - 2 wheels, Cane - single point, Environmental consultant - 4 wheels, Grab bars - tub/shower Additional Comments: Spouse works but runs his own company and can come and go as needed.  Lives With: Spouse Functional History: Prior Function Level of Independence: Needs assistance Gait / Transfers Assistance Needed: Pt recently d/ced from CIR on Dec 7 and only walking 7 feet with w/c follow. Has not walked since being home. Transfers with assist.  ADL's / Homemaking Assistance Needed: Husband assists with ADLs and does IADLs. Pt reports using depends at home and not transferring to toilet. Comments: Has scooter for mobility. Functional Status: Mobility: Bed Mobility Overal bed mobility: Needs Assistance Bed Mobility: Rolling, Sidelying to Sit, Sit to Sidelying Rolling: Mod assist Sidelying to sit: Max assist, +2 for physical assistance, HOB elevated Sit to sidelying: +2 for physical assistance, Mod assist General bed mobility comments: cues for sequencing and technique; assist to bring R LE to EOB and to elevate trunk into sitting; assist to bring bilat LE and lower trunk when returning to supine Transfers Overall transfer level: Needs assistance Equipment used: Rolling  walker (2 wheeled) Transfers: Sit to/from Stand Sit to Stand: Max assist, +2 physical assistance, From elevated surface General transfer comment: cues for safe hand placement and positioning prior to stand; pt was able to stand for brief periods no longer than 30 seconds with max A +2 to power up and maintain balance; R LE weakness and R knee buckling if not assisted to brace; lateral scoot attempt to recliner deferred as pt began feeling like she would "pass out"  ADL: ADL Overall ADL's : Needs assistance/impaired Eating/Feeding: Set up, Bed level Grooming: Wash/dry hands, Wash/dry face, Brushing hair, Set up, Bed level Upper Body Bathing: Moderate assistance, Bed level Lower Body Bathing: Total assistance, Bed level Upper Body Dressing : Bed level, Maximal assistance Lower Body Dressing: Total assistance, Bed level General ADL Comments: Pt set EOB with max assist support for balance. Became very nauseous sitting EOB  and unable to tolerate OOB transfer.  Cognition: Cognition Overall Cognitive Status: Within Functional Limits for tasks assessed Orientation Level: Oriented X4 Cognition Arousal/Alertness: Awake/alert Behavior During Therapy: WFL for tasks assessed/performed Overall Cognitive Status: Within Functional Limits for tasks assessed  Blood pressure (!) 118/56, pulse 79, temperature 99.1 F (37.3 C), temperature source Oral, resp. rate 16, height 5\' 8"  (1.727 m), weight 72.6 kg (160 lb), SpO2 93 %. Physical Exam Vitalsreviewed. Constitutional: She isoriented to person, place, and time. She appearswell-developedand well-nourished.  HENT:  Head:Normocephalicand atraumatic.  Eyes:EOMare normal. Right eye exhibits no discharge. Left eye exhibitsno discharge.  Neck:Normal range of motion.Neck supple.No thyromegalypresent.  Cardiovascular:Normal rate,regular rhythmand normal heart sounds.  Respiratory:Effort normaland breath sounds normal. Norespiratory  distress.  ST:MHDQ.Bowel sounds are normal. She exhibitsno distension.  Musculoskeletal: No edema or tenderness in extremities Neurological: She isalertand oriented to person, place, and time. Motor: B/l UE 4+/5 proximal to distal B/l LE: 4/5 proximal to distal RLE: HF, KE 0/5, ADF/PF 4/5 Sensation diminished to light touch medial RLE Skin: Skin iswarmand dry. Back incision is dressed +Drain Psychiatric: She has anormal mood and affect. Herbehavior is normal.            Assessment/Plan: Diagnosis:Debility Labs independently reviewed. Records reviewed and summated above.  1. Does the need for close, 24 hr/day medical supervision in concert with the patient's rehab needs make it unreasonable for this patient to be served in a less intensive setting?Yes 2. Co-Morbidities requiring supervision/potential complications:Acute on chronic anemia (transfuse if necessary to ensure appropriate perfusion for increased activity tolerance),UTI(cont abx), post-oppain management on chronic pain (Biofeedback training with therapies to help reduce reliance on opiate pain medications, monitor pain control during therapies, and sedation at rest and titrate to maximum efficacy to ensure participation and gains in therapies),diabetes mellitus(Monitor in accordance with exercise and adjust meds as necessary), multiple prior thoracic or lumbar surgeries, ID (D/c IV Vanc when appropriate),hypokalemia (continue to monitor and replete as necessary) 3. Due tobladder management, bowel management, safety, skin/wound care, disease management, pain management and patient education, does the patient require 24 hr/day rehab nursing?Yes 4. Does the patient require coordinated care of a physician, rehab nurse,PT (1-2hrs/day, 5days/week) and OT (1-2hrs/day, 5days/week)to address physical and functional deficits in the context of the above medical diagnosis(es)?Yes Addressing deficits  in the following areas:balance, endurance, locomotion, strength, transferring, bathing, dressing, toileting and psychosocial support 5. Can the patient actively participate in an intensive therapy program of at least 3 hrs of therapy per day at least 5 days per week?Yes 6. The potential for patient to make measurable gains while on inpatient rehab isexcellent 7. Anticipated functional outcomes upon discharge from inpatient rehab aremin assistwith PT, min assistwith OT, min assistwith SLP. 8. Estimated rehab length of stay to reach the above functional goals is:18-22 days. 9. Anticipated D/C setting:Home 10. Anticipated post D/C treatments:HH therapy and Home excercise program 11. Overall Rehab/Functional Prognosis:good  RECOMMENDATIONS: This patient's condition is appropriate for continued rehabilitative care in the following setting:CIR when medically appropriate Patient has agreed to participate in recommended program.Yes Note that insurance prior authorization may be required for reimbursement for recommended care.  Comment:Rehab Admissions Coordinator to follow up.  Delice Lesch, MD, ABPMR 09/03/17 Lavon Paganini Angiulli, PA-C 09/05/2017          Revision History                        Routing History

## 2017-09-08 NOTE — Progress Notes (Signed)
Patient arrived at approximately 1945 alert and oriented and without c/o pain or discomfort.

## 2017-09-08 NOTE — Progress Notes (Signed)
Subjective/Complaints: Husband notes pt has been lethargic since yesterday Pt sleeping but arousable  ROS-- no abd pain , no N/V, having sm BM daily, no SOB or cough, unable to void Objective: Vital Signs: Blood pressure (!) 142/67, pulse 81, temperature 97.8 F (36.6 C), temperature source Oral, resp. rate 18, SpO2 99 %. No results found. Results for orders placed or performed during the hospital encounter of 09/07/17 (from the past 72 hour(s))  Glucose, capillary     Status: Abnormal   Collection Time: 09/07/17  9:19 PM  Result Value Ref Range   Glucose-Capillary 201 (H) 65 - 99 mg/dL  Glucose, capillary     Status: Abnormal   Collection Time: 09/08/17  6:40 AM  Result Value Ref Range   Glucose-Capillary 128 (H) 65 - 99 mg/dL   Comment 1 Notify RN      HEENT: normal Cardio: RRR and no murmur Resp: CTA B/L and unlabored GI: BS positive and NT, ND Extremity:  No Edema Skin:   Wound C/D/I and thoracolumbar Neuro: Lethargic, Abnormal Sensory reduce LT left S1, level of alertness limits testing and Abnormal Motor 5/5 in BUE, 3- B HF, 4- B KE, 3- Bilateral ADF Musc/Skel:  Other neg SLR, no Knee or ankle joint swelling or erythema, no hand wrist or elbow erythema or swelling Gen NAD   Assessment/Plan: 1. Functional deficits secondary to L3 body fracture and malpositioning of S1 screw requiring redo T12-L4 fusion and repostioning of S1 screw which require 3+ hours per day of interdisciplinary therapy in a comprehensive inpatient rehab setting. Physiatrist is providing close team supervision and 24 hour management of active medical problems listed below. Physiatrist and rehab team continue to assess barriers to discharge/monitor patient progress toward functional and medical goals. FIM:       Function - Toileting Toileting activity did not occur: No continent bowel/bladder event           Function - Comprehension Comprehension: Auditory  Function -  Expression Expression: Verbal        Function - Memory Patient normally able to recall (first 3 days only): Current season, Location of own room, Staff names and faces, That he or she is in a hospital  Medical Problem List and Plan: 1.  Decreased functional mobility secondary to L3 body fracture with compression and posterior displacement and repositioning of lateral sacral screw extension of lumbar fusion to thoracic T12 09/02/2017 as well as history of multiple thoracic lumbar surgeries. Back brace when out of bed. CIR evals today 2.  DVT Prophylaxis/Anticoagulation: SCDs. Check vascular study 3. Pain Management/chronic back pain: Baclofen 10 mg 3 times a day, MS Contin 30 mg every 12 hours, Neurontin 300 mg 3 times a day, Celebrex 200 mg every 12 hours, Valium for muscle spasms and hydrocodone for breakthrough pain as needed  4. Mood: Provide emotional support 5. Neuropsych: This patient is capable of making decisions on her own behalf. 6. Skin/Wound Care: Routine skin checks 7. Fluids/Electrolytes/Nutrition: Routine I&O's with follow-up chemistries 8. Acute blood loss anemia. Follow-up CBC 9. Diabetes mellitus peripheral neuropathy. SSI. Hemoglobin A1c 6.0. Check blood sugars before meals and at bedtime. Patient on Glucophage 500 mg twice a day prior to admission. Resume as needed CBG (last 3) controlled Recent Labs    09/07/17 1722 09/07/17 2119 09/08/17 0640  GLUCAP 151* 201* 128*   10. Hypertension. Lisinopril 5 mg daily Vitals:   09/07/17 2015 09/08/17 0235  BP: 131/62 (!) 142/67  Pulse: 73 81  Resp:  17 18  Temp: 98.9 F (37.2 C) 97.8 F (36.6 C)  SpO2: 100% 99%   11. Neurogenic bladder. Intermittent catheterizations 3 times a day as prior to admission. Continue Urecholine and Flomax 12. Hyperlipidemia. Lipitor  13.  Lethargy- likely med related started 12/26 per husband Will d/c Valium and MS Contin, check UA C and S  LOS (Days) 1 A FACE TO FACE EVALUATION WAS  PERFORMED  Charlett Blake 09/08/2017, 8:04 AM

## 2017-09-09 ENCOUNTER — Inpatient Hospital Stay (HOSPITAL_COMMUNITY): Payer: BLUE CROSS/BLUE SHIELD | Admitting: Occupational Therapy

## 2017-09-09 ENCOUNTER — Inpatient Hospital Stay (HOSPITAL_COMMUNITY): Payer: BLUE CROSS/BLUE SHIELD | Admitting: Physical Therapy

## 2017-09-09 ENCOUNTER — Inpatient Hospital Stay (HOSPITAL_COMMUNITY): Payer: BLUE CROSS/BLUE SHIELD

## 2017-09-09 LAB — GLUCOSE, CAPILLARY
GLUCOSE-CAPILLARY: 131 mg/dL — AB (ref 65–99)
GLUCOSE-CAPILLARY: 145 mg/dL — AB (ref 65–99)
GLUCOSE-CAPILLARY: 114 mg/dL — AB (ref 65–99)

## 2017-09-09 LAB — URINE CULTURE: CULTURE: NO GROWTH

## 2017-09-09 NOTE — Progress Notes (Addendum)
[facility] Individual Statement of Services  Patient Name:  Megan [name]  Date:  09/09/2017  Welcome to the [facility].  Our goal is to provide you with an individualized program based on your diagnosis and situation, designed to meet your specific needs.  With this comprehensive rehabilitation program, you will be expected to participate in at least 3 hours of rehabilitation therapies Monday-Friday, with modified therapy programming on the weekends.  Your rehabilitation program will include the following services:  Physical Therapy (PT), Occupational Therapy (OT), 24 hour per day rehabilitation nursing, Case Management (Social Worker), Rehabilitation Medicine, Nutrition Services and Pharmacy Services  Weekly team conferences will be held on Wednesdays to discuss your progress.  Your Social Worker will talk with you frequently to get your input and to update you on team discussions.  Team conferences with you and your family in attendance may also be held.  Expected length of stay:  15 to 19 days  Overall anticipated outcome:  Minimal assistance  Depending on your progress and recovery, your program may change. Your Social Worker will coordinate services and will keep you informed of any changes. Your Social Worker's name and contact numbers are listed  below.  The following services may also be recommended but are not provided by the [facility] will be made to provide these services after discharge if needed.  Arrangements include referral to agencies that provide these services.  Your insurance has been verified to be:  [employer] Your primary doctor is:  Alma Friendly, AGNP  Pertinent information will be shared with your doctor and your insurance company.  Social Worker:  Alfonse Alpers,  LCSW  [phone] or [phone]  Information discussed with and copy given to patient by: Trey Sailors, 09/09/2017, 10:53 AM

## 2017-09-09 NOTE — Progress Notes (Signed)
Occupational Therapy Session Note  Patient Details  Name: Megan [name] MRN: [id number] Date of Birth: Nov 14, 1950  Today's Date: 09/09/2017 OT Individual Time: 1415-1500 ; 1000-1100 OT Individual Time Calculation (min): 45 min + 60 min   Short Term Goals: Week 1:  OT Short Term Goal 1 (Week 1): Pt will sit to stand with MOD A of 1 wiht RW to decrease burden of care OT Short Term Goal 2 (Week 1): Pt will stand pivot transfer to [facility] with MAX A 1 caregiver OT Short Term Goal 3 (Week 1): Pt will don footwear with AE PRN OT Short Term Goal 4 (Week 1): Pt will bathe LB wiht MIN A PRN  Skilled Therapeutic Interventions/Progress Updates:    Session 1:   Treatment session focused on ADLs/self care training, transfer training, activity tolerance, and pt education. Upon entering pt supine in berd with HOB elevated, resting and willing to complete AM ADLs with OT. Pts vitals taken as precaution with BP 128/71. No shower orders by MD. Bed bathing completed. LB d/b completed with max A for rolling R>L. Pt completed frontal perineal care with set up, required mod A for buttocks hygiene. Pt completed bed mob from supine to EOB sit with HOB elevated with max A and v/c for hand and foot placement. EOB UB dressing completed with set up A; Mod A for donning AFO. Noted tremors in R LE and leg weakness. Pt completed lateral scoot>w/c with min A and v/c for grooming task at sink. Pt reported mild pain in back. Pt left resting in w/c with needs met.   Session 2:   Treatment session focused on ADLs/self care training, transfer training, and pt education. Upon entering room, pt supine in bed with HOB elevated with daughter and granddaughter present. BP taken at rest 114/56. Worked on bed mob from supine to EOB sit, administered and demo'ed leg lifter to pat for R LE mobility, donning/doffing TLSO in sitting EOB, and bed<>BSC>w/c transfers. Pt moved from supine to EOB with leg lifter and mod A for UB guiding, noted  increased back pain with movement. Pt tolerated upright sitting to don TLSO with min A. Pt transferred bed<>BSC with min A and v/c for han/foot placement. Pt completed bed>w/c transfer with min A. Pt reported pain to go down in back as she rested. BP reading again 133/65. Pt left resting in w/c for next therapy with needs met.   Therapy Documentation Precautions:  Precautions Precautions: Fall, Back Precaution Booklet Issued: No Required Braces or Orthoses: Spinal Brace Spinal Brace: Thoracolumbosacral orthotic, Applied in sitting position Other Brace/Splint: AFO RLE (not present during eval) Restrictions Weight Bearing Restrictions: No   Vital Signs:   Pain: Pain Assessment Pain Score: 7  Pain Type: Surgical pain Pain Location: Back Pain Orientation: Medial Pain Descriptors / Indicators: Aching Pain Onset: With Activity Patients Stated Pain Goal: 0 Pain Intervention(s): Repositioned ADL: ADL ADL Comments: see functional navigator See Function Navigator for Current Functional Status.   Therapy/Group: Individual Therapy  Delon Sacramento 09/09/2017, 3:59 PM

## 2017-09-09 NOTE — Progress Notes (Addendum)
Subjective/Complaints:  More alert today, dropped her drink with L hand twice yesterday. Denies neck pain, no numbness or tingling in L hand , hx of CTS R hand but did not require surgery  ROS-- no abd pain , no N/V, having sm BM daily, no SOB or cough, unable to void Objective: Vital Signs: Blood pressure 135/66, pulse 80, temperature 98.3 F (36.8 C), temperature source Oral, resp. rate 17, SpO2 98 %. No results found. Results for orders placed or performed during the hospital encounter of 09/07/17 (from the past 72 hour(s))  Glucose, capillary     Status: Abnormal   Collection Time: 09/07/17  9:19 PM  Result Value Ref Range   Glucose-Capillary 201 (H) 65 - 99 mg/dL  Glucose, capillary     Status: Abnormal   Collection Time: 09/08/17  6:40 AM  Result Value Ref Range   Glucose-Capillary 128 (H) 65 - 99 mg/dL   Comment 1 Notify RN   Urinalysis, Routine w reflex microscopic     Status: None   Collection Time: 09/08/17  8:04 AM  Result Value Ref Range   Color, Urine YELLOW YELLOW   APPearance CLEAR CLEAR   Specific Gravity, Urine 1.005 1.005 - 1.030   pH 6.0 5.0 - 8.0   Glucose, UA NEGATIVE NEGATIVE mg/dL   Hgb urine dipstick NEGATIVE NEGATIVE   Bilirubin Urine NEGATIVE NEGATIVE   Ketones, ur NEGATIVE NEGATIVE mg/dL   Protein, ur NEGATIVE NEGATIVE mg/dL   Nitrite NEGATIVE NEGATIVE   Leukocytes, UA NEGATIVE NEGATIVE  CBC WITH DIFFERENTIAL     Status: Abnormal   Collection Time: 09/08/17  9:35 AM  Result Value Ref Range   WBC 5.5 4.0 - 10.5 K/uL   RBC 2.69 (L) 3.87 - 5.11 MIL/uL   Hemoglobin 7.1 (L) 12.0 - 15.0 g/dL   HCT 23.2 (L) 36.0 - 46.0 %   MCV 86.2 78.0 - 100.0 fL   MCH 26.4 26.0 - 34.0 pg   MCHC 30.6 30.0 - 36.0 g/dL   RDW 15.0 11.5 - 15.5 %   Platelets 282 150 - 400 K/uL   Neutrophils Relative % 72 %   Neutro Abs 4.0 1.7 - 7.7 K/uL   Lymphocytes Relative 15 %   Lymphs Abs 0.8 0.7 - 4.0 K/uL   Monocytes Relative 10 %   Monocytes Absolute 0.6 0.1 - 1.0 K/uL    Eosinophils Relative 3 %   Eosinophils Absolute 0.2 0.0 - 0.7 K/uL   Basophils Relative 0 %   Basophils Absolute 0.0 0.0 - 0.1 K/uL  Comprehensive metabolic panel     Status: Abnormal   Collection Time: 09/08/17  9:35 AM  Result Value Ref Range   Sodium 140 135 - 145 mmol/L   Potassium 3.5 3.5 - 5.1 mmol/L   Chloride 103 101 - 111 mmol/L   CO2 27 22 - 32 mmol/L   Glucose, Bld 188 (H) 65 - 99 mg/dL   BUN 5 (L) 6 - 20 mg/dL   Creatinine, Ser 0.59 0.44 - 1.00 mg/dL   Calcium 8.4 (L) 8.9 - 10.3 mg/dL   Total Protein 5.4 (L) 6.5 - 8.1 g/dL   Albumin 2.4 (L) 3.5 - 5.0 g/dL   AST 13 (L) 15 - 41 U/L   ALT 13 (L) 14 - 54 U/L   Alkaline Phosphatase 113 38 - 126 U/L   Total Bilirubin 0.9 0.3 - 1.2 mg/dL   GFR calc non Af Amer >60 >60 mL/min   GFR calc Af Amer >  60 >60 mL/min    Comment: (NOTE) The eGFR has been calculated using the CKD EPI equation. This calculation has not been validated in all clinical situations. eGFR's persistently <60 mL/min signify possible Chronic Kidney Disease.    Anion gap 10 5 - 15  Glucose, capillary     Status: Abnormal   Collection Time: 09/08/17 11:34 AM  Result Value Ref Range   Glucose-Capillary 156 (H) 65 - 99 mg/dL  Glucose, capillary     Status: Abnormal   Collection Time: 09/08/17  4:40 PM  Result Value Ref Range   Glucose-Capillary 131 (H) 65 - 99 mg/dL  Glucose, capillary     Status: Abnormal   Collection Time: 09/08/17  9:49 PM  Result Value Ref Range   Glucose-Capillary 222 (H) 65 - 99 mg/dL  Glucose, capillary     Status: Abnormal   Collection Time: 09/09/17  6:36 AM  Result Value Ref Range   Glucose-Capillary 131 (H) 65 - 99 mg/dL     HEENT: normal Cardio: RRR and no murmur Resp: CTA B/L and unlabored GI: BS positive and NT, ND Extremity:  No Edema Skin:   Wound C/D/I and thoracolumbar Neuro: Alert and oriented, Abnormal Sensory reduce LT left S1, level of alertness limits testing and Abnormal Motor 5/5 in BUE, 3- B HF, 4- B KE,  3- Bilateral ADF Musc/Skel:  Other neg SLR, no Knee or ankle joint swelling or erythema, no hand wrist or elbow erythema or swelling Gen NAD   Assessment/Plan: 1. Functional deficits secondary to L3 body fracture and malpositioning of S1 screw requiring redo T12-L4 fusion and repostioning of S1 screw which require 3+ hours per day of interdisciplinary therapy in a comprehensive inpatient rehab setting. Physiatrist is providing close team supervision and 24 hour management of active medical problems listed below. Physiatrist and rehab team continue to assess barriers to discharge/monitor patient progress toward functional and medical goals. FIM: Function - Bathing Position: Bed Body parts bathed by patient: Right arm, Abdomen, Chest, Left arm, Right upper leg, Left upper leg Body parts bathed by helper: Front perineal area, Buttocks, Right lower leg, Left lower leg Assist Level: Touching or steadying assistance(Pt > 75%)  Function- Upper Body Dressing/Undressing What is the patient wearing?: Orthosis, Hospital gown Orthosis activity level: Performed by patient Assist Level: Touching or steadying assistance(Pt > 75%) Set up : To obtain clothing/put away Function - Lower Body Dressing/Undressing What is the patient wearing?: [place], Non-skid slipper socks Non-skid slipper socks- Performed by helper: Don/doff left sock, Don/doff right sock TED Hose - Performed by helper: Don/doff right TED hose, Don/doff left TED hose Assist for footwear: Dependant Assist for lower body dressing: Touching or steadying assistance (Pt > 75%) Set up : To obtain clothing/put away  Function - Toileting Toileting activity did not occur: No continent bowel/bladder event Toileting steps completed by helper: Adjust clothing prior to toileting, Performs perineal hygiene, Adjust clothing after toileting Assist level: Touching or steadying assistance (Pt.75%)  Function - Toilet Transfers Toilet  transfer activity did not occur: N/A Toilet transfer assistive device: (Bedpan.) Assist level to toilet: Touching or steadying assistance (Pt > 75%) Assist level from toilet: Touching or steadying assistance (Pt > 75%)  Function - Chair/bed transfer Chair/bed transfer method: Lateral scoot Chair/bed transfer assist level: Touching or steadying assistance (Pt > 75%) Chair/bed transfer assistive device: Armrests, Orthosis Chair/bed transfer details: Verbal cues for precautions/safety, Verbal cues for technique, Verbal cues for sequencing     Function -  Comprehension Comprehension: Auditory Comprehension assist level: Follows complex conversation/direction with extra time/assistive device  Function - Expression Expression: Verbal Expression assist level: Expresses complex ideas: With extra time/assistive device  Function - Social Interaction Social Interaction assist level: Interacts appropriately with others with medication or extra time (anti-anxiety, antidepressant).  Function - Problem Solving Problem solving assist level: Solves basic problems with no assist  Function - Memory Memory assist level: Complete Independence: No helper Patient normally able to recall (first 3 days only): Current season, Location of own room, Staff names and faces, That he or she is in a hospital  Medical Problem List and Plan: 1.  Decreased functional mobility secondary to L3 body fracture with compression and posterior displacement and repositioning of lateral sacral screw extension of lumbar fusion to thoracic T12 09/02/2017 as well as history of multiple thoracic lumbar surgeries. Back brace when out of bed. CIR PT, OT 2.  DVT Prophylaxis/Anticoagulation: SCDs. Check vascular study 3. Pain Management/chronic back pain: Baclofen 10 mg 3 times a day, MS Contin 30 mg every 12 hours, Neurontin 300 mg 3 times a day, Celebrex 200 mg every 12 hours, Valium for muscle spasms and hydrocodone for breakthrough  pain as needed  4. Mood: Provide emotional support 5. Neuropsych: This patient is capable of making decisions on her own behalf. 6. Skin/Wound Care: Routine skin checks 7. Fluids/Electrolytes/Nutrition: Routine I&O's with follow-up chemistries 8. Acute blood loss anemia. Follow-up CBC 9. Diabetes mellitus peripheral neuropathy. SSI. Hemoglobin A1c 6.0. Check blood sugars before meals and at bedtime. Patient on Glucophage 500 mg twice a day prior to admission. Resume as needed CBG (last 3) controlled Recent Labs    09/08/17 1640 09/08/17 2149 09/09/17 0636  GLUCAP 131* 222* 131*   10. Hypertension. Lisinopril 5 mg daily Vitals:   09/08/17 1614 09/09/17 0352  BP: (!) 115/44 135/66  Pulse: 73 80  Resp:  17  Temp:  98.3 F (36.8 C)  SpO2:  98%   11. Neurogenic bladder. Intermittent catheterizations 3 times a day as prior to admission. Continue Urecholine and Flomax 12. Hyperlipidemia. Lipitor  13.  Lethargy- Improved likely med related started 12/26 per husband Will d/c Valium and MS Contin, recheck UA neg 14.  Hypoalb- Prostat LOS (Days) 2 A FACE TO FACE EVALUATION WAS PERFORMED  Charlett Blake 09/09/2017, 7:47 AM

## 2017-09-09 NOTE — Progress Notes (Signed)
Physical Therapy Session Note  Patient Details  Name: Megan [name] MRN: [id number] Date of Birth: 13-Sep-1951  Today's Date: 09/09/2017 PT Individual Time: 1530-1625 PT Individual Time Calculation (min): 55 min   Short Term Goals: Week 1:  PT Short Term Goal 1 (Week 1): Pt will transfer with min A from bed to chair. PT Short Term Goal 2 (Week 1): Standing and ambulation to be assessed.  Skilled Therapeutic Interventions/Progress Updates:    no c/o pain.  BP assessed in sitting and 99/57.  PT applied ace wraps to above knees BLEs and reassessed BP (116/57).  Session focus on activity tolerance and w/c mobility.    Pt propels w/c to therapy gym with increased time and supervision.  Kinetron from w/c position x10 minutes at 30 cm/s with cycles of 1 minute on, 1 minute rest.    Pt completes w/c>mat>w/c transfer with steady assist.  PT provided pt with ultra lightweight w/c for improved mobility and to maximize activity tolerance.  Pt returned to room in w/c mod I.  Declines back to bed, positioned upright with call bell in reach and needs met.   Therapy Documentation Precautions:  Precautions Precautions: Fall, Back Precaution Booklet Issued: No Required Braces or Orthoses: Spinal Brace Spinal Brace: Thoracolumbosacral orthotic, Applied in sitting position Other Brace/Splint: AFO RLE (not present during eval) Restrictions Weight Bearing Restrictions: No   See Function Navigator for Current Functional Status.   Therapy/Group: Individual Therapy  Michel Santee 09/09/2017, 4:39 PM

## 2017-09-09 NOTE — Progress Notes (Signed)
Physical Therapy Session Note  Patient Details  Name: Megan [name] MRN: [id number] Date of Birth: 28-Jun-1951  Today's Date: 09/09/2017 PT Individual Time: 0830-0900 PT Individual Time Calculation (min): 30 min   Short Term Goals: Week 1:  PT Short Term Goal 1 (Week 1): Pt will transfer with min A from bed to chair. PT Short Term Goal 2 (Week 1): Standing and ambulation to be assessed.  Skilled Therapeutic Interventions/Progress Updates:    Focused session on NMR to BLE in supine to address strength, motor control, and ROM. PROM and stretching to heel cords bilaterally x 30 sec x 3 reps. 10 reps each of active assisted heel slides, hip abduction/adduction, hooklying isometric hip abduction/adduction with 10 sec hold, and active quad sets with 5 sec hold. Clonus noted on R during passive stretching into DF. Deferred OOB until completed bathing at bed level with OT and pt agreement.  Therapy Documentation Precautions:  Precautions Precautions: Fall, Back Precaution Booklet Issued: No Required Braces or Orthoses: Spinal Brace Spinal Brace: Thoracolumbosacral orthotic, Applied in sitting position Other Brace/Splint: AFO RLE (not present during eval) Restrictions Weight Bearing Restrictions: No   Pain: Reports discomfort in R scapula area and back - premedicated.   See Function Navigator for Current Functional Status.   Therapy/Group: Individual Therapy  Canary Brim Ivory Broad, PT, DPT  09/09/2017, 9:43 AM

## 2017-09-10 ENCOUNTER — Inpatient Hospital Stay (HOSPITAL_COMMUNITY): Payer: BLUE CROSS/BLUE SHIELD

## 2017-09-10 ENCOUNTER — Inpatient Hospital Stay (HOSPITAL_COMMUNITY): Payer: BLUE CROSS/BLUE SHIELD | Admitting: Physical Therapy

## 2017-09-10 LAB — GLUCOSE, CAPILLARY
GLUCOSE-CAPILLARY: 159 mg/dL — AB (ref 65–99)
Glucose-Capillary: 153 mg/dL — ABNORMAL HIGH (ref 65–99)
Glucose-Capillary: 209 mg/dL — ABNORMAL HIGH (ref 65–99)

## 2017-09-10 NOTE — Progress Notes (Signed)
Patient ID: Megan [name], female   DOB: 1950/11/01, 66 y.o.   MRN: [id number]   09/10/2017.  Megan [name] is a 66 y.o. female who was admitted for CIR with functional deficits secondary to L3 body fracture and malpositioning of S1 screw requiring redo T12-L4 fusion and repostioning of S1 screw   Past Medical History:  Diagnosis Date  . Arthritis    "hands, back" (07/26/2017)  . Dyspnea    W/ PHYS CONDITION   . GERD (gastroesophageal reflux disease)   . History of bronchitis    "not since I quit smoking" (07/26/2017)  . History of kidney stones   . History of shingles   . Hyperlipidemia    takes Fish Oil daily  . Hypertension   . Neuromuscular disorder ([facility])    tingling toes  . 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 Pneumonia 2009  . PONV (postoperative nausea and vomiting)   . Restless leg   . Type 2 diabetes mellitus ([place])   . Uterine cancer ([place]) 1979   S/P hysterectomy  . Weakness    numbness and tingling in both feet r/t back     Subjective: No new complaints. No new problems. Slept well.   Objective: Vital signs in last 24 hours: Temp:  [97.8 F (36.6 C)-98.1 F (36.7 C)] 98.1 F (36.7 C) (12/29 0450) Pulse Rate:  [74-82] 82 (12/29 0450) Resp:  [18-19] 18 (12/29 0450) BP: (122-154)/(53-71) 154/53 (12/29 0450) SpO2:  [97 %-98 %] 97 % (12/29 0450) Weight change:  Last BM Date: 09/09/17  Intake/Output from previous day: 12/28 0701 - 12/29 0700 In: 600 [P.O.:600] Out: 1500 [Urine:1500] Last cbgs: CBG (last 3)  Recent Labs    09/09/17 0636 09/09/17 1152 09/09/17 1629  GLUCAP 131* 145* 114*   Patient Vitals for the past 24 hrs:  BP Temp Temp src Pulse Resp SpO2  09/10/17 0450 (!) 154/53 98.1 F (36.7 C) Oral 82 18 97 %  09/09/17 1420 122/68 97.8 F (36.6 C) Oral 74 19 98 %  09/09/17 1051 128/71 - - - - -    Physical Exam General: No apparent distress   HEENT: Unremarkable Lungs: Normal effort. Lungs clear to auscultation, no  crackles or wheezes. Cardiovascular: Regular rate and rhythm, no edema Abdomen: S/NT/ND; BS(+) Musculoskeletal: Trace lower extremity edema Neurological: No new neurological deficits with lower extremity paresis right leg much greater than the left Wounds: N/A    Skin: clear   Mental state: Alert, oriented, cooperative    Lab Results: BMET    Component Value Date/Time   NA 140 09/08/2017 0935   K 3.5 09/08/2017 0935   CL 103 09/08/2017 0935   CO2 27 09/08/2017 0935   GLUCOSE 188 (H) 09/08/2017 0935   BUN 5 (L) 09/08/2017 0935   CREATININE 0.59 09/08/2017 0935   CALCIUM 8.4 (L) 09/08/2017 0935   GFRNONAA >60 09/08/2017 0935   GFRAA >60 09/08/2017 0935   CBC    Component Value Date/Time   WBC 5.5 09/08/2017 0935   RBC 2.69 (L) 09/08/2017 0935   HGB 7.1 (L) 09/08/2017 0935   HCT 23.2 (L) 09/08/2017 0935   PLT 282 09/08/2017 0935   MCV 86.2 09/08/2017 0935   MCH 26.4 09/08/2017 0935   MCHC 30.6 09/08/2017 0935   RDW 15.0 09/08/2017 0935   LYMPHSABS 0.8 09/08/2017 0935   MONOABS 0.6 09/08/2017 0935   EOSABS 0.2 09/08/2017 0935   BASOSABS 0.0 09/08/2017 0935     Medications:  I have reviewed the patient's current medications.  Assessment/Plan:  Functional deficits secondary to L3 body fracture and malpositioning of S1 screw.  Status post redo T12-L4 fusion.  Continue CIR DVT prophylaxis.  Continue SCDs Pain management.  Continue MS Contin Celebrex and as needed Valium for spasm as well as hydrocodone for breakthrough pain as needed Acute blood loss anemia.  Will check follow-up CBC diabetes mellitus.  Nice glycemic control     Length of stay, days: 3  Nyoka Cowden , MD 09/10/2017, 9:39 AM

## 2017-09-10 NOTE — Progress Notes (Signed)
Physical Therapy Session Note  Patient Details  Name: Megan [name] MRN: [id number] Date of Birth: 1951-08-25  Today's Date: 09/10/2017 PT Individual Time: 0830-0945 PT Individual Time Calculation (min): 75 min   Short Term Goals: Week 1:  PT Short Term Goal 1 (Week 1): Pt will transfer with min A from bed to chair. PT Short Term Goal 2 (Week 1): Standing and ambulation to be assessed.  Skilled Therapeutic Interventions/Progress Updates:    c/o 8/10 but reports being pre-medicated.  Session focus on dynamic sitting balance, transfers, and UE strength/FMC.   Pt transitions to EOB with mod assist to elevate trunk.  Dressing from EOB with focus on dynamic sitting balance with forward and lateral leans for LB dressing with steady assist and frequent rest breaks due to fatigue.  Min verbal cues for back precautions.  Pt dons shirt and TLSO with set up assist to obtain items.  PT applied TEDs, aces, and shoes for time management.  Pt requires min assist to transfer into w/c via lateral scoot with verbal cues to stay scooted back in w/c rather than scoot to very front edge during transfer.  ADLs at sink level with set up/mod I.    Pt propels w/c throughout unit mod I.  UEB x4 minutes forward and x4 minutes backwards at 1.5 level focus on UE strength and overall activity tolerance. [facility] task with pill boxes at tabletop.  Pt returned to room at end of session and left upright with call bell in reach and needs met.   Therapy Documentation Precautions:  Precautions Precautions: Fall, Back Precaution Booklet Issued: No Required Braces or Orthoses: Spinal Brace Spinal Brace: Thoracolumbosacral orthotic, Applied in sitting position Other Brace/Splint: AFO RLE (not present during eval) Restrictions Weight Bearing Restrictions: No   See Function Navigator for Current Functional Status.   Therapy/Group: Individual Therapy  Michel Santee 09/10/2017, 9:52 AM

## 2017-09-10 NOTE — Progress Notes (Addendum)
Social Work Assessment and Plan  Patient Details  Name: Megan [name] MRN: [id number] Date of Birth: [birth year]  Today's Date: 09/09/2017  Problem List:  Patient Active Problem List   Diagnosis Date Noted  . Debility   . S/P fusion of thoracic spine   . Anemia of chronic disease   . Chronic pain syndrome   . Hypokalemia   . Acute lower UTI 09/04/2017  . L3 vertebral fracture ([place]) 09/01/2017  . Constipation due to pain medication   . Muscle spasms of both lower extremities   . Type 2 diabetes mellitus with peripheral neuropathy (HCC)   . Hypoalbuminemia due to protein-calorie malnutrition ([place])   . Radiculopathy 07/28/2017  . Fusion of spine, lumbosacral region   . Acute blood loss anemia   . Benign essential HTN   . Diabetes mellitus type 2 in obese ([place])   . Lumbosacral spondylosis with radiculopathy 07/26/2017  . Sensation of pressure in bladder area 01/31/2017  . Paraplegia, incomplete ([place]) 01/07/2017  . Neurogenic bladder 12/27/2016  . 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/27/2016  . Myelopathy ([place]) 12/21/2016  . Surgery, elective   . Diabetes mellitus type 2 in nonobese (HCC)   . History of lumbar fusion   . History of fusion of cervical spine   . Neuropathic pain   . Post-operative pain   . 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 Paraparesis ([place]) 12/15/2016  . Pedal edema 12/14/2016  . Type 2 diabetes mellitus without complication, without long-term current use of insulin ([place]) 11/02/2016  . Hyperlipidemia 11/02/2016  . Essential hypertension 11/02/2016  . 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   Past Medical History:  Past Medical History:  Diagnosis Date  . Arthritis    "hands, back" (07/26/2017)  . Dyspnea    W/ PHYS CONDITION   . GERD (gastroesophageal reflux disease)   . History of bronchitis    "not since I quit smoking" (07/26/2017)  . History of kidney stones   .  History of shingles   . Hyperlipidemia    takes Fish Oil daily  . Hypertension   . Neuromuscular disorder (HCC)    tingling toes  . 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[place]) 12/15/2016  . Pneumonia 2009  . PONV (postoperative nausea and vomiting)   . Restless leg   . Type 2 diabetes mellitus ([place])   . Uterine cancer ([place]) 1979   S/P hysterectomy  . Weakness    numbness and tingling in both feet r/t back   Past Surgical History:  Past Surgical History:  Procedure Laterality Date  . ABDOMINAL EXPOSURE N/A 07/26/2017   Procedure: ABDOMINAL EXPOSURE;  Surgeon: Angelia Mould, MD;  Location: [place];  Service: Vascular;  Laterality: N/A;  . ANTERIOR CERVICAL DECOMP/DISCECTOMY FUSION  2001  . ANTERIOR LUMBAR FUSION N/A 07/26/2017   Procedure: Lumbar five-Sacral one Anterior lumbar interbody fusion with Dr. Deitra Mayo for approach;  Surgeon: Ditty, Kevan Ny, MD;  Location: [place];  Service: Neurosurgery;  Laterality: N/A;  . APPLICATION OF ROBOTIC ASSISTANCE FOR SPINAL PROCEDURE  12/19/2016   Procedure: APPLICATION OF ROBOTIC ASSISTANCE FOR SPINAL PROCEDURE;  Surgeon: Kevan Ny Ditty, MD;  Location: [place];  Service: Neurosurgery;;  . APPLICATION OF ROBOTIC ASSISTANCE FOR SPINAL PROCEDURE N/A 07/26/2017   Procedure: APPLICATION OF ROBOTIC ASSISTANCE FOR SPINAL PROCEDURE;  Surgeon: Ditty, Kevan Ny, MD;  Location: [place];  Service: Neurosurgery;  Laterality: N/A;  . APPLICATION OF ROBOTIC ASSISTANCE FOR  SPINAL PROCEDURE N/A 09/02/2017   Procedure: APPLICATION OF ROBOTIC ASSISTANCE FOR SPINAL PROCEDURE;  Surgeon: Ditty, Kevan Ny, MD;  Location: [place];  Service: Neurosurgery;  Laterality: N/A;  . BACK SURGERY    . BLADDER SUSPENSION  1991   tack  . INGUINAL HERNIA REPAIR Right 1991  . LUMBAR FUSION  2016; 2017; 07/26/2017   L4-5; L2-3; L5-S1  . POSTERIOR LUMBAR FUSION 4 WITH HARDWARE REMOVAL N/A 09/02/2017   Procedure: Lumbar three-four redo laminectomy;  Repositioning of Left Sacral two screw; Extension of lumbar fusion to Thoracic twelve;  Surgeon: Ditty, Kevan Ny, MD;  Location: [place];  Service: Neurosurgery;  Laterality: N/A;  . TONSILLECTOMY AND ADENOIDECTOMY  1959  . TUMOR EXCISION     WERTHIN'S TUMORS BOTH SIDES OF NECK  . VAGINAL HYSTERECTOMY  1979   Social History:  reports that she quit smoking about 11 years ago. Her smoking use included cigarettes. She has a 55.50 pack-year smoking history. she has never used smokeless tobacco. She reports that she drinks alcohol. She reports that she does not use drugs.  Family / Support Systems Marital Status: Married How Long?: 38 years Patient Roles: Spouse, Parent, Other (Comment)(friend, neighbor, sister) Spouse/Significant Other: Lyann Hagstrom - husband - [phone] (h); [phone] (m) Children: Hughes Better - dtr - [phone] (h)*; [phone] Other Supports: son-in-law; brother; neighbor; friend; 22 y/o granddtr Anticipated Caregiver: spouse, dtr, neighbor, newly retired friend Ability/Limitations of Caregiver: Spouse works but owns his company with flexible schedule.  He is a self Administrator, [employer] Service and can arrange his hours to coordinate with dtr and neighbor and friend availability to care ofr pt. Caregiver Availability: 24/7 Family Dynamics: close, supportive family/friends  Social History Preferred language: English Religion: Baptist Read: Yes Write: Yes Employment Status: Retired Date Retired/Disabled/Unemployed: June 2017 Age Retired: 68 Legal History/Current Legal Issues: none reported Guardian/Conservator: N/A - Dr. Letta Pate has determined that pt is capable of making her own decisions.   Abuse/Neglect Abuse/Neglect Assessment Can Be Completed: Yes Physical Abuse: Denies Verbal Abuse: Denies Sexual Abuse: Denies Exploitation of patient/patient's resources: Denies Self-Neglect: Denies  Emotional Status Pt's affect, behavior and adjustment status:  Pt is somewhat frustrated to be back on CIR, but knows what to expect and what she needs to do to get stronger.  She is also upbeat that Dr. Cyndy Freeze has done everything he can do to her back and that there is nothing in his opinion to mess up from here on. Recent Psychosocial Issues: Multiple surgeries, losing function, not being able to do household chores/cook/laundry, etc Psychiatric History: none reported Substance Abuse History: none reported  Patient / Family Perceptions, Expectations & Goals Pt/Family understanding of illness & functional limitations: Pt/family report a good understanding of pt's condition and limitations.  Pt has good support and feels the doctors have answered all of her question.   Premorbid pt/family roles/activities: Pt enjoys reading, working [employer], and spending time with her granddtr and other family members. Anticipated changes in roles/activities/participation: Pt wants to resume the above activities as she is able and continues to want to do more around the house, like she did prior to back troubles.  She wants to take some of the load off of her husband, although he seems to want to do chores to protect pt.  Dtr is helping with out with housecleaning while she is in between jobs. Pt/family expectations/goals: Pt wants to regain as much independence as she can and get to where she can do some things  around the house.  She also wants to be able to walk with the walker.  Community [employer] Agencies: None Premorbid Home Care/DME Agencies: Other (Comment)([facility] Cathie Beams); [place]) Transportation available at discharge: family  Discharge Planning Living Arrangements: Spouse/significant other Support Systems: Spouse/significant other, Children, Other relatives, Friends/neighbors, Home care staff Type of Residence: Private residence Insurance Resources: Multimedia programmer (specify)([employer] [place]) Financial Resources: Social Security, Family Support Financial Screen Referred: No Money Management: Spouse Does the patient have any problems obtaining your medications?: No Home Management: Husband and dtr are taking care of this right now.  Pt tries to dust and wash some dishes when she is able. Patient/Family Preliminary Plans: Pt to return to her home with her husband who will take some time off to be with her.  Dtr, neighbor, and a newly retired friend will be able to assist pt at home. Social Work Anticipated Follow Up Needs: HH/OP Expected length of stay: 15 to 19 days  Clinical Impression CSW met with pt and her dtr and granddtr to welcome pt back to CIR and let her know that CSW would work with her once again.  Pt is somewhat frustrated to be back as a pt, as she really looked forward to visiting only, but she knows where she is and what she needs to do to get better and get home.  Pt is optimistic that the surgeon feels he's fixed everything and that there's really nothing left to go "bad".  Pt is pleased to be on CIR where she knows the staff and is ready to work, once again.  She did seem more tired, weary to CSW this visit, so CSW will continue to assess pt for coping and refer to neuropsychologist, if needed. CSW will follow pt and assist as needed.  Hubbard Seldon, Silvestre Mesi 09/10/2017, 1:10 AM

## 2017-09-10 NOTE — Progress Notes (Signed)
Occupational Therapy Session Note  Patient Details  Name: Megan [name] MRN: [id number] Date of Birth: 1951/02/21  Today's Date: 09/10/2017 OT Individual Time: 1400-1445 OT Individual Time Calculation (min): 45 min    Short Term Goals: Week 1:  OT Short Term Goal 1 (Week 1): Pt will sit to stand with MOD A of 1 wiht RW to decrease burden of care OT Short Term Goal 2 (Week 1): Pt will stand pivot transfer to [facility] with MAX A 1 caregiver OT Short Term Goal 3 (Week 1): Pt will don footwear with AE PRN OT Short Term Goal 4 (Week 1): Pt will bathe LB wiht MIN A PRN  Skilled Therapeutic Interventions/Progress Updates:    1:1. Pt asleep upon arrival with husband present in room. OT assists with threading BLE into pants at bed level for time management. Pt rolls B with min A to L for LE management and pt advances pants past hips. Pt lateral scoot transfer throughout session with min A EOB>w/c<>EOM with VC for BLE management throughout transfer. Pt assumes sidelying for gravity eliminated exercises on BLE 1x15 hip flexion/ext and knee flex/ext on powderboard. Pt completes 3x5 clamshell exercises BLE with against manual resistance for LE adduction and 1x10 glute bridges with OT stabilizing BLE. Exited session with pt seated in w/c in room with call light in reach and all needs met.    Therapy Documentation Precautions:  Precautions Precautions: Fall, Back Precaution Booklet Issued: No Required Braces or Orthoses: Spinal Brace Spinal Brace: Thoracolumbosacral orthotic, Applied in sitting position Other Brace/Splint: AFO RLE (not present during eval) Restrictions Weight Bearing Restrictions: No  See Function Navigator for Current Functional Status.   Therapy/Group: Individual Therapy  Tonny Branch 09/10/2017, 2:52 PM

## 2017-09-10 NOTE — Progress Notes (Signed)
Occupational Therapy Session Note  Patient Details  Name: Megan [name] MRN: [id number] Date of Birth: [birth year]  Today's Date: 09/10/2017 OT Individual Time: 8453-6468 OT Individual Time Calculation (min): 72 min    Short Term Goals: Week 1:  OT Short Term Goal 1 (Week 1): Pt will sit to stand with MOD A of 1 wiht RW to decrease burden of care OT Short Term Goal 2 (Week 1): Pt will stand pivot transfer to [facility] with MAX A 1 caregiver OT Short Term Goal 3 (Week 1): Pt will don footwear with AE PRN OT Short Term Goal 4 (Week 1): Pt will bathe LB wiht MIN A PRN  Skilled Therapeutic Interventions/Progress Updates:    1;1. Pt seated in w/c prepared for OT upon arrival with 2/10 pain in back. Pt bathes UB at sink with set up for obtaining wash cloths and A to wash back. Pt dons bra, TLSO and pull over shirt with set up. Pt reporting pain in buttocks after sitting in chair for extended periods of time. OT educates on pressure relief protocol and strategies such as leaning and w/c push up. Pt propels w/c to tx gym to standing frame BP monitored and recorded below. Pt stands in standing frame with total A for WB through B feet and plays game of connect four with 1UE support. After sitting in w/c, BUE tremors present requiring OT to apply deep pressure to  B knees for tremors to subside. Pt propels w/cback to room and transfers to EOB with min A and VC to remove w/c leg rests prior to lateral scoot transfer. Pt rolls B with touching A to doff pants and soiled brief, cleanse peri area and buttocks and don new brief. Exited session with pt supine in bed call light in reach and all needs met.   Sitting in chair 109/62 Standing: 102/50 Standing prolonged period: 110/92  Therapy Documentation Precautions:  Precautions Precautions: Fall, Back Precaution Booklet Issued: No Required Braces or Orthoses: Spinal Brace Spinal Brace: Thoracolumbosacral orthotic, Applied in sitting position Other  Brace/Splint: AFO RLE (not present during eval) Restrictions Weight Bearing Restrictions: No General:   Vital Signs:  See Function Navigator for Current Functional Status.   Therapy/Group: Individual Therapy  Tonny Branch 09/10/2017, 12:18 PM

## 2017-09-11 ENCOUNTER — Inpatient Hospital Stay (HOSPITAL_COMMUNITY): Payer: BLUE CROSS/BLUE SHIELD | Admitting: Occupational Therapy

## 2017-09-11 LAB — CBC
HCT: 21.1 % — ABNORMAL LOW (ref 36.0–46.0)
Hemoglobin: 6.5 g/dL — CL (ref 12.0–15.0)
MCH: 26.4 pg (ref 26.0–34.0)
MCHC: 30.8 g/dL (ref 30.0–36.0)
MCV: 85.8 fL (ref 78.0–100.0)
PLATELETS: 316 10*3/uL (ref 150–400)
RBC: 2.46 MIL/uL — AB (ref 3.87–5.11)
RDW: 15.5 % (ref 11.5–15.5)
WBC: 5.5 10*3/uL (ref 4.0–10.5)

## 2017-09-11 LAB — PREPARE RBC (CROSSMATCH)

## 2017-09-11 LAB — GLUCOSE, CAPILLARY
GLUCOSE-CAPILLARY: 151 mg/dL — AB (ref 65–99)
GLUCOSE-CAPILLARY: 141 mg/dL — AB (ref 65–99)
GLUCOSE-CAPILLARY: 167 mg/dL — AB (ref 65–99)
Glucose-Capillary: 156 mg/dL — ABNORMAL HIGH (ref 65–99)

## 2017-09-11 MED ORDER — SODIUM CHLORIDE 0.9 % IV SOLN
Freq: Once | INTRAVENOUS | Status: AC
  Administered 2017-09-11: 11:00:00 via INTRAVENOUS

## 2017-09-11 MED ORDER — SODIUM CHLORIDE 0.9 % IV SOLN: Freq: Once | INTRAVENOUS | Status: AC

## 2017-09-11 NOTE — Significant Event (Signed)
At 7 am received lab report with hgb of 6.5, MD notified, he said he will address it when he see patient. Passed it on to day shift. We continue to monitor.

## 2017-09-11 NOTE — Progress Notes (Signed)
Patient ID: Megan Megan [name], female   DOB: Feb 24, 1951, 66 y.o.   MRN: [id number]   Megan Megan [name] is a 66 y.o. female who was admitted for CIR with functional deficits secondary to L3 body fracture and malpositioning of S1 screw requiring a redo T12-L4 fusion with repositioning of the S1 screw.  Subjective: Megan [name] new complaints. Megan [name] new problems.  Complaining of some increased right leg spasms. Patient noted to have worsening anemia today with hemoglobin down to 6.5 g%.  The patient generally feels well, but when questioned does admit to some mild weakness and dizziness when she first stands or sits upright in bed.  There has been Megan [name] orthostatic hypotension documented.  Past Medical History:  Diagnosis Date  . Arthritis    "hands, back" (07/26/2017)  . Dyspnea    W/ PHYS CONDITION   . GERD (gastroesophageal reflux disease)   . History of bronchitis    "not since I quit smoking" (07/26/2017)  . History of kidney stones   . History of shingles   . Hyperlipidemia    takes Fish Oil daily  . Hypertension   . Neuromuscular disorder (HCC)    tingling toes  . 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[place]) 12/15/2016  . Pneumonia 2009  . PONV (postoperative nausea and vomiting)   . Restless leg   . Type 2 diabetes mellitus ([place])   . Uterine cancer ([place]) 1979   S/P hysterectomy  . Weakness    numbness and tingling in both feet r/t back     Objective: Vital signs in last 24 hours: Temp:  [98.1 F (36.7 C)-98.2 F (36.8 C)] 98.1 F (36.7 C) (12/30 0533) Pulse Rate:  [79] 79 (12/30 0533) Resp:  [18] 18 (12/30 0533) BP: (118-126)/(46-52) 126/52 (12/30 0533) SpO2:  [98 %-100 %] 98 % (12/30 0533) Weight change:  Last BM Date: 09/09/17  Intake/Output from previous day: 12/29 0701 - 12/30 0700 In: 340 [P.O.:340] Out: 1575 [Urine:1575] Last cbgs: CBG (last 3)  Recent Labs    09/10/17 1645 09/10/17 2138 09/11/17 0641  GLUCAP 153* 209* 151*   Patient Vitals for the past 24 hrs:  BP Temp  Temp src Pulse Resp SpO2  09/11/17 0533 (!) 126/52 98.1 F (36.7 C) Oral 79 18 98 %  09/10/17 1340 (!) 118/46 98.2 F (36.8 C) Oral 79 18 100 %    Physical Exam General: Megan [name] apparent distress   HEENT: not dry Lungs: Normal effort. Lungs clear to auscultation, Megan [name] crackles or wheezes. Cardiovascular: Regular rate and rhythm, Megan [name] edema; Megan [name] resting tachycardia  Abdomen: S/NT/ND; BS(+) Musculoskeletal:  unchanged Neurological: Megan [name] new neurological deficits Wounds: stable  Skin: clear  pale Mental state: Alert, oriented, cooperative    Lab Results: BMET    Component Value Date/Time   NA 140 09/08/2017 0935   K 3.5 09/08/2017 0935   CL 103 09/08/2017 0935   CO2 27 09/08/2017 0935   GLUCOSE 188 (H) 09/08/2017 0935   BUN 5 (L) 09/08/2017 0935   CREATININE 0.59 09/08/2017 0935   CALCIUM 8.4 (L) 09/08/2017 0935   GFRNONAA >60 09/08/2017 0935   GFRAA >60 09/08/2017 0935   CBC    Component Value Date/Time   WBC 5.5 09/11/2017 0603   RBC 2.46 (L) 09/11/2017 0603   HGB 6.5 (LL) 09/11/2017 0603   HCT 21.1 (L) 09/11/2017 0603   PLT 316 09/11/2017 0603   MCV 85.8 09/11/2017 0603   MCH 26.4 09/11/2017 0603   MCHC 30.8 09/11/2017 0603  RDW 15.5 09/11/2017 0603   LYMPHSABS 0.8 09/08/2017 0935   MONOABS 0.6 09/08/2017 0935   EOSABS 0.2 09/08/2017 0935   BASOSABS 0.0 09/08/2017 0935    Medications: I have reviewed the patient's current medications.  Assessment/Plan:  Postoperative blood loss anemia.  Current AABB guidelines for restrictive transfusion strategies reviewed.  Majority of guidelines suggest that the individual patient may benefit with hemoglobin values between 7 and 8 g%.  After discussion with patient and husband, elected to transfuse 1 unit of PRBC and follow post transfusion CBCs.  Functional deficits secondary to redo T12-L4 fusion.  Continue CIR DVT prophylaxis.  Continue SCDs  Diabetes mellitus.  Stable.  Megan [name] change in regimen   Length of stay, days:  [place] , MD 09/11/2017, 9:28 AM

## 2017-09-11 NOTE — Progress Notes (Signed)
Occupational Therapy Session Note  Patient Details  Name: Megan [name] MRN: [id number] Date of Birth: 1951-02-02  Today's Date: 09/11/2017 OT Individual Time: [phone] OT Individual Time Calculation (min): 59 min   Short Term Goals: Week 1:  OT Short Term Goal 1 (Week 1): Pt will sit to stand with MOD A of 1 wiht RW to decrease burden of care OT Short Term Goal 2 (Week 1): Pt will stand pivot transfer to [facility] with MAX A 1 caregiver OT Short Term Goal 3 (Week 1): Pt will don footwear with AE PRN OT Short Term Goal 4 (Week 1): Pt will bathe LB wiht MIN A PRN  Skilled Therapeutic Interventions/Progress Updates:    Pt greeted supine in bed receiving blood transfusion. Per RN, room therapy ok per pts tolerance. Caregiver education completed with spouse in regards to donning Ted stockings. Taught him adaptive method for donning them to increase ease. He was able to demonstrate carryover of technique with practice and OT demonstration. Afterwards pt transitioned to EOB with Min A for guiding R LE. Once she donned back orthosis, she completed grooming tasks while seated with setup. Afterwards trained her on adaptive method for donning footwear to work on functional LE skills while adhering to back precautions. Pt exhibiting carryover with use of reacher and shoe funnel for donning sneakers. Pt able to fasten, unfasten, and tighten velcro with reacher and instruction. Able to elevate each leg and place foot into shoe herself! She then reported wanting to sit in w/c for a bit. Scoot transfer to w/c completed with Min A. When RN arrived, pt needed to be transferred back to bed for catheterization. Min A scoot transfer back to bed and pt transitioned back to supine with use of leg lifter and Min A. Pt repositioned for comfort in bed and left with all needs within reach.   BP while sitting briefly in w/c 160/71. RN made aware.   Therapy Documentation Precautions:  Precautions Precautions: Fall,  Back Precaution Booklet Issued: No Required Braces or Orthoses: Spinal Brace Spinal Brace: Thoracolumbosacral orthotic, Applied in sitting position Other Brace/Splint: AFO RLE (not present during eval) Restrictions Weight Bearing Restrictions: No Vital Signs: Therapy Vitals Temp: 98.4 F (36.9 C) Temp Source: Oral Pulse Rate: 64 Resp: 20 BP: (!) 129/54 Patient Position (if appropriate): Sitting Oxygen Therapy SpO2: 99 % O2 Device: Not Delivered Pain: Pain Assessment Pain Score: 3  Pain Type: Surgical pain Pain Location: Back Pain Descriptors / Indicators: Aching Pain Intervention(s): Medication (See eMAR) ADL: ADL ADL Comments: see functional navigator    See Function Navigator for Current Functional Status.  Therapy/Group: Individual Therapy  Megan [name] 09/11/2017, 3:39 PM

## 2017-09-12 ENCOUNTER — Inpatient Hospital Stay (HOSPITAL_COMMUNITY): Payer: BLUE CROSS/BLUE SHIELD | Admitting: Physical Therapy

## 2017-09-12 ENCOUNTER — Inpatient Hospital Stay (HOSPITAL_COMMUNITY): Payer: BLUE CROSS/BLUE SHIELD | Admitting: Occupational Therapy

## 2017-09-12 LAB — GLUCOSE, CAPILLARY
GLUCOSE-CAPILLARY: 146 mg/dL — AB (ref 65–99)
GLUCOSE-CAPILLARY: 163 mg/dL — AB (ref 65–99)
Glucose-Capillary: 136 mg/dL — ABNORMAL HIGH (ref 65–99)
Glucose-Capillary: 115 mg/dL — ABNORMAL HIGH (ref 65–99)

## 2017-09-12 LAB — TYPE AND SCREEN
ABO/RH(D): A POS
Antibody Screen: NEGATIVE
UNIT DIVISION: 0

## 2017-09-12 LAB — BPAM RBC
BLOOD PRODUCT EXPIRATION DATE: 201901232359
ISSUE DATE / TIME: 201812301107
Unit Type and Rh: 6200

## 2017-09-12 LAB — HEMOGLOBIN AND HEMATOCRIT, BLOOD
HEMATOCRIT: 27.3 % — AB (ref 36.0–46.0)
Hemoglobin: 8.4 g/dL — ABNORMAL LOW (ref 12.0–15.0)

## 2017-09-12 MED ORDER — BACLOFEN 10 MG PO TABS
10.0000 mg | ORAL_TABLET | Freq: Four times a day (QID) | ORAL | Status: DC
  Administered 2017-09-12 – 2017-09-17 (×20): 10 mg via ORAL
  Filled 2017-09-12 (×21): qty 1

## 2017-09-12 MED ORDER — FERROUS SULFATE 325 (65 FE) MG PO TABS
325.0000 mg | ORAL_TABLET | Freq: Two times a day (BID) | ORAL | Status: DC
  Administered 2017-09-12 – 2017-09-17 (×11): 325 mg via ORAL
  Filled 2017-09-12 (×11): qty 1

## 2017-09-12 NOTE — Progress Notes (Signed)
Occupational Therapy Session Note  Patient Details  Name: Megan [name] MRN: [id number] Date of Birth: 11-Oct-1950  Today's Date: 09/12/2017 OT Individual Time: 1100-1158 and 1300-1427 OT Individual Time Calculation (min): 58 min and 87 min    Short Term Goals: Week 1:  OT Short Term Goal 1 (Week 1): Pt will sit to stand with MOD A of 1 wiht RW to decrease burden of care OT Short Term Goal 2 (Week 1): Pt will stand pivot transfer to [facility] with MAX A 1 caregiver OT Short Term Goal 3 (Week 1): Pt will don footwear with AE PRN OT Short Term Goal 4 (Week 1): Pt will bathe LB wiht MIN A PRN  Skilled Therapeutic Interventions/Progress Updates:   Session 1: Upon entering the room, pt supine in bed with family present in room and pt with no c/o pain this session. Skilled OT intervention with focus on bathing, dressing, and functional transfers. Pt bathing LB from bed level with assistance from therapist. TED hose donned and compression wraps placed for hypotension. Pt rolled L <> R with min A for management for LEs and pulled pants over B hips. Pt donned pull over shirt and bra while seated on EOB with min A for balance. Pt transferred into wheelchair with steady assistance scoot pivot transfer. Pt donning B leg rests and then propelling herself to sink for grooming tasks at mod I level. Pt remained in wheelchair with family present and lunch tray arriving. Plan made for afternoon session prior to exiting. Call bell and all needed items within reach.   Session 2:  Upon entering the room, pt seated in wheelchair upon entering the room with no c/o pain this session. Pt's BP while seated in wheelchair 123/63. Pt propelled wheelchair to gift shop 500'+ at mod I level for increased time. Once in gift shop, pt propelled wheelchair around various obstacles, OT educated pt on energy conservation with community mobility, and pt utilized Secondary school teacher with verbal cues from therapist to obtain items from wall and shelves  while maintaining back precautions. Pt propelled self back to room and set up wheelchair for transfer with steadying of equipment needed only. Once seated on EOB, pt utilized reacher to remove shoes and leg lifter to place B LE's onto bed during sit >supine. Pt supine in bed with call bell and all needed items within reach upon exiting the room.   Therapy Documentation Precautions:  Precautions Precautions: Fall, Back Precaution Booklet Issued: No Required Braces or Orthoses: Spinal Brace Spinal Brace: Thoracolumbosacral orthotic, Applied in sitting position Other Brace/Splint: AFO RLE (not present during eval) Restrictions Weight Bearing Restrictions: No  Pain: Pain Assessment Pain Assessment: 0-10 Pain Score: 8  Pain Type: Surgical pain Pain Location: Back Pain Orientation: Upper Pain Radiating Towards: right leg Pain Descriptors / Indicators: Aching;Throbbing;Cramping;Spasm Pain Frequency: Occasional Pain Onset: On-going Pain Intervention(s): Medication (See eMAR) ADL: ADL ADL Comments: see functional navigator  See Function Navigator for Current Functional Status.   Therapy/Group: Individual Therapy  Gypsy Decant 09/12/2017, 12:36 PM

## 2017-09-12 NOTE — IPOC Note (Signed)
Overall Plan of Care [facility]) Patient Details Name: Megan [name] MRN: [id number] DOB: 12-18-1950  Admitting Diagnosis: <principal problem not specified>  Hospital Problems: Active Problems:   Myelopathy  [facility])     Functional Problem List: Nursing Skin Integrity, Pain, Motor  PT Balance, Pain, Endurance, Motor, Sensory  OT Balance, Endurance, Edema, Motor, Pain, Safety, Sensory  SLP    TR         Basic ADL's: OT Grooming, Bathing, Dressing, Toileting     Advanced  ADL's: OT       Transfers: PT Bed Mobility, Bed to Chair, Car, Manufacturing systems engineer, Metallurgist: PT Ambulation, Emergency planning/management officer, Stairs     Additional Impairments: OT None  SLP        TR      Anticipated Outcomes Item Anticipated Outcome  Self Feeding n/a  Swallowing      Basic self-care  supervision to min a  Toileting  min A    Bathroom Transfers min A   Bowel/Bladder  Min assisst.  Transfers  Supervision to min A  Locomotion  Mod I w/c; Ambulation TBD  Communication     Cognition     Pain  <3  Safety/Judgment  Remain free of falls, infections, and skin breakdown.   Therapy Plan: PT Intensity: Minimum of 1-2 x/day ,45 to 90 minutes PT Frequency: 5 out of 7 days PT Duration Estimated Length of Stay: 15 to 19 days(15 to 19 days) OT Intensity: Minimum of 1-2 x/day, 45 to 90 minutes OT Frequency: 5 out of 7 days OT Duration/Estimated Length of Stay: 15- 19 days      Team Interventions: Nursing Interventions Patient/Family Education, Skin Care/Wound Management, Discharge Planning, Pain Management, Bladder Management, Bowel Management  PT interventions Ambulation/gait training, Functional mobility training, Discharge planning, DME/adaptive equipment instruction, Pain management, Psychosocial support, Therapeutic Activities, UE/LE Strength taining/ROM, Wheelchair propulsion/positioning, UE/LE Coordination activities, Therapeutic Exercise, Neuromuscular re-education,  Disease management/prevention, Academic librarian, Training and development officer  OT Interventions Training and development officer, Community reintegration, Discharge planning, DME/adaptive equipment instruction, Functional electrical stimulation, Psychosocial support, Therapeutic Exercise, Therapeutic Activities, Functional mobility training, Self Care/advanced ADL retraining, Pain management, Patient/family education, Skin care/wound managment, UE/LE Strength taining/ROM, UE/LE Coordination activities, Wheelchair propulsion/positioning, Neuromuscular re-education  SLP Interventions    TR Interventions    SW/CM Interventions Discharge Planning, Psychosocial Support, Patient/Family Education   Barriers to Discharge MD  Medical stability, Neurogenic bowel and bladder and Wound care  Nursing Incontinence, Wound Care    PT Decreased caregiver support    OT      SLP      SW       Team Discharge Planning: Destination: PT-Home ,OT- Home , SLP-  Projected Follow-up: PT-Home health PT, 24 hour supervision/assistance, OT-  Outpatient OT, SLP-  Projected Equipment Needs: PT-To be determined, OT- To be determined, SLP-  Equipment Details: PT- , OT-  Patient/family involved in discharge planning: PT- Patient, Family member/caregiver,  OT-Patient, SLP-   MD ELOS: 14-18d Medical Rehab Prognosis:  Good Assessment:  66 y.o.right handed femalewith history of diabetes mellitus, chronic back pain as well as multiple prior thoracic or lumbar surgeries/neurogenic bladder with catheterization 3 times daily maintained on Urecholine and Flomax and received inpatient rehabilitation services 07/28/2017-08/19/2017 after multilevel lumbar laminectomy with lateral fixation.History taken from chart review and patient.She was discharged home with spouse. Supervision for wheelchair to mat transfers. She was standing in the standing frame predominantly using her wheelchair at home.Her husband works but can assist as  needed as well as a daughter.Presented 08/29/2017 with nausea, vomiting, abdominal pain and frequent bowel movements. CT abdomen pelvis revealed distended rectum by stool. Also noted L3 body fracture with compression and posterior displacement. Bowel program was regulated. Neurosurgery follow-up underwent lumbar 3-4 redo laminectomy repositioning of lateral sacral screw extension of lumbar fusion to thoracic 12 09/02/2017 per Dr.Ditty. Hospital course pain management. Patient required wound VAC and Hemovac. Lumbar corset when out of bed applied in sitting position. Treated for Escherichia coli UTI with Rocephin completed 09/05/2017. Acute on chronic anemia 7.0 and monitored   Now requiring 24/7 Rehab RN,MD, as well as CIR level PT, OT and SLP.  Treatment team will focus on ADLs and mobility with goals set at minA/Sup   See Team Conference Notes for weekly updates to the plan of care

## 2017-09-12 NOTE — Progress Notes (Signed)
Subjective/Complaints: Was requiring ICP at home   ROS-- no abd pain , no N/V, having sm BM daily, no SOB or cough, unable to void Objective: Vital Signs: Blood pressure (!) 157/77, pulse 66, temperature 97.6 F (36.4 C), temperature source Oral, resp. rate 18, SpO2 100 %. No results found. Results for orders placed or performed during the hospital encounter of 09/07/17 (from the past 72 hour(s))  Glucose, capillary     Status: Abnormal   Collection Time: 09/09/17 11:52 AM  Result Value Ref Range   Glucose-Capillary 145 (H) 65 - 99 mg/dL   Comment 1 Notify RN   Glucose, capillary     Status: Abnormal   Collection Time: 09/09/17  4:29 PM  Result Value Ref Range   Glucose-Capillary 114 (H) 65 - 99 mg/dL   Comment 1 Notify RN   Glucose, capillary     Status: Abnormal   Collection Time: 09/10/17 11:59 AM  Result Value Ref Range   Glucose-Capillary 159 (H) 65 - 99 mg/dL  Glucose, capillary     Status: Abnormal   Collection Time: 09/10/17  4:45 PM  Result Value Ref Range   Glucose-Capillary 153 (H) 65 - 99 mg/dL  Glucose, capillary     Status: Abnormal   Collection Time: 09/10/17  9:38 PM  Result Value Ref Range   Glucose-Capillary 209 (H) 65 - 99 mg/dL  CBC     Status: Abnormal   Collection Time: 09/11/17  6:03 AM  Result Value Ref Range   WBC 5.5 4.0 - 10.5 K/uL   RBC 2.46 (L) 3.87 - 5.11 MIL/uL   Hemoglobin 6.5 (LL) 12.0 - 15.0 g/dL    Comment: REPEATED TO VERIFY CRITICAL RESULT CALLED TO, READ BACK BY AND VERIFIED WITH: D.COLLIEO,RN 1610 09/11/17 G.MCADOO    HCT 21.1 (L) 36.0 - 46.0 %   MCV 85.8 78.0 - 100.0 fL   MCH 26.4 26.0 - 34.0 pg   MCHC 30.8 30.0 - 36.0 g/dL   RDW 15.5 11.5 - 15.5 %   Platelets 316 150 - 400 K/uL  Glucose, capillary     Status: Abnormal   Collection Time: 09/11/17  6:41 AM  Result Value Ref Range   Glucose-Capillary 151 (H) 65 - 99 mg/dL  Prepare RBC     Status: None   Collection Time: 09/11/17  9:38 AM  Result Value Ref Range   Order  Confirmation ORDER PROCESSED BY BLOOD BANK   Type and screen [place]     Status: None   Collection Time: 09/11/17  9:38 AM  Result Value Ref Range   ABO/RH(D) A POS    Antibody Screen NEG    Sample Expiration 09/14/2017    Unit Number [id number]    Blood Component Type RED CELLS,LR    Unit division 00    Status of Unit ISSUED,FINAL    Transfusion Status OK TO TRANSFUSE    Crossmatch Result Compatible   Glucose, capillary     Status: Abnormal   Collection Time: 09/11/17 11:35 AM  Result Value Ref Range   Glucose-Capillary 141 (H) 65 - 99 mg/dL  Glucose, capillary     Status: Abnormal   Collection Time: 09/11/17  4:27 PM  Result Value Ref Range   Glucose-Capillary 156 (H) 65 - 99 mg/dL  Glucose, capillary     Status: Abnormal   Collection Time: 09/11/17  9:00 PM  Result Value Ref Range   Glucose-Capillary 167 (H) 65 - 99 mg/dL  Glucose, capillary  Status: Abnormal   Collection Time: 09/12/17  6:33 AM  Result Value Ref Range   Glucose-Capillary 136 (H) 65 - 99 mg/dL     HEENT: normal Cardio: RRR and no murmur Resp: CTA B/L and unlabored GI: BS positive and NT, ND Extremity:  No Edema Skin:   Wound C/D/I and thoracolumbar Neuro: Alert and oriented, Abnormal Sensory reduce LT left S1, level of alertness limits testing and Abnormal Motor 5/5 in BUE, 3- B HF, 4- B KE, 3- Bilateral ADF Musc/Skel:  Other neg SLR, no Knee or ankle joint swelling or erythema, no hand wrist or elbow erythema or swelling Gen NAD   Assessment/Plan: 1. Functional deficits secondary to L3 body fracture and malpositioning of S1 screw requiring redo T12-L4 fusion and repostioning of S1 screw which require 3+ hours per day of interdisciplinary therapy in a comprehensive inpatient rehab setting. Physiatrist is providing close team supervision and 24 hour management of active medical problems listed below. Physiatrist and rehab team continue to assess barriers to discharge/monitor  patient progress toward functional and medical goals. FIM: Function - Bathing Position: Bed Body parts bathed by patient: Right arm, Abdomen, Chest, Left arm, Right upper leg, Left upper leg, Front perineal area Body parts bathed by helper: Front perineal area, Buttocks, Right lower leg, Left lower leg Assist Level: Touching or steadying assistance(Pt > 75%)  Function- Upper Body Dressing/Undressing What is the patient wearing?: Bra, Pull over shirt/dress Bra - Perfomed by patient: Thread/unthread right bra strap, Thread/unthread left bra strap, Hook/unhook bra (pull down sports bra) Pull over shirt/dress - Perfomed by patient: Thread/unthread right sleeve, Thread/unthread left sleeve, Pull shirt over trunk, Put head through opening Orthosis activity level: Performed by patient Assist Level: Set up Set up : To obtain clothing/put away Function - Lower Body Dressing/Undressing What is the patient wearing?: Maryln Manuel, Shoes Position: Sitting EOB Underwear - Performed by helper: Thread/unthread right underwear leg, Thread/unthread left underwear leg, Pull underwear up/down Pants- Performed by patient: Thread/unthread right pants leg, Pull pants up/down Pants- Performed by helper: Thread/unthread left pants leg Non-skid slipper socks- Performed by helper: Don/doff right sock, Don/doff left sock Shoes - Performed by patient: Don/doff right shoe, Don/doff left shoe, Fasten right, Fasten left TED Hose - Performed by helper: Don/doff right TED hose, Don/doff left TED hose Assist for footwear: Dependant Assist for lower body dressing: Assistive device Assistive Device Comment: Reacher + shoe funnel Set up : To obtain clothing/put away  Function - Toileting Toileting activity did not occur: No continent bowel/bladder event Toileting steps completed by patient: Performs perineal hygiene Toileting steps completed by helper: Adjust clothing prior to toileting, Performs perineal hygiene, Adjust  clothing after toileting Toileting Assistive Devices: Grab bar or rail Assist level: Touching or steadying assistance (Pt.75%)  Function - Air cabin crew transfer activity did not occur: N/A Toilet transfer assistive device: Drop arm commode Assist level to toilet: Touching or steadying assistance (Pt > 75%) Assist level from toilet: Touching or steadying assistance (Pt > 75%)  Function - Chair/bed transfer Chair/bed transfer method: Lateral scoot Chair/bed transfer assist level: Touching or steadying assistance (Pt > 75%) Chair/bed transfer assistive device: Armrests Chair/bed transfer details: Verbal cues for precautions/safety, Verbal cues for technique, Verbal cues for sequencing  Function - Locomotion: Wheelchair Will patient use wheelchair at discharge?: Yes Type: Manual Max wheelchair distance: 150' Assist Level: No help, No cues, assistive device, takes more than reasonable amount of time Assist Level: No help, No cues, assistive device, takes more than  reasonable amount of time Assist Level: No help, No cues, assistive device, takes more than reasonable amount of time Turns around,maneuvers to table,bed, and toilet,negotiates 3% grade,maneuvers on rugs and over doorsills: Yes  Function - Comprehension Comprehension: Auditory Comprehension assist level: Follows complex conversation/direction with no assist  Function - Expression Expression: Verbal Expression assist level: Expresses complex ideas: With no assist  Function - Social Interaction Social Interaction assist level: Interacts appropriately with others - No medications needed.  Function - Problem Solving Problem solving assist level: Solves complex problems: Recognizes & self-corrects  Function - Memory Memory assist level: Complete Independence: No helper Patient normally able to recall (first 3 days only): Current season, Location of own room, Staff names and faces, That he or she is in a  hospital  Medical Problem List and Plan: 1.  Decreased functional mobility secondary to L3 body fracture with compression and posterior displacement and repositioning of lateral sacral screw extension of lumbar fusion to thoracic T12 09/02/2017 as well as history of multiple thoracic lumbar surgeries. Back brace when out of bed. CIR PT, OT 2.  DVT Prophylaxis/Anticoagulation: SCDs. Check vascular study 3. Pain Management/chronic back pain: Baclofen 10 mg 3 times a day, MS Contin 30 mg every 12 hours, Neurontin 300 mg 3 times a day, Celebrex 200 mg every 12 hours, Valium for muscle spasms and hydrocodone for breakthrough pain as needed  4. Mood: Provide emotional support 5. Neuropsych: This patient is capable of making decisions on her own behalf. 6. Skin/Wound Care: Routine skin checks 7. Fluids/Electrolytes/Nutrition: Routine I&O's with follow-up chemistries 8. Acute blood loss anemia. Follow-up CBC 9. Diabetes mellitus peripheral neuropathy. SSI. Hemoglobin A1c 6.0. Check blood sugars before meals and at bedtime. Patient on Glucophage 500 mg twice a day prior to admission. Resume as needed CBG (last 3) controlled Recent Labs    09/11/17 1627 09/11/17 2100 09/12/17 0633  GLUCAP 156* 167* 136*   10. Hypertension. Lisinopril 5 mg daily Vitals:   09/11/17 1344 09/12/17 0339  BP: (!) 129/54 (!) 157/77  Pulse: 64 66  Resp: 20 18  Temp: 98.4 F (36.9 C) 97.6 F (36.4 C)  SpO2: 99% 100%   11. Neurogenic bladder. Intermittent catheterizations 3 times a day as prior to admission.no need for  Urecholine and Flomax  12. Hyperlipidemia. Lipitor  13.  Lethargy- Improved was med related MSO4 and valium     14.  Hypoalb- Prostat 15.  Spasticity RLE adductors Hamstrings and ankle plantar flexors and invertors increase baclofen to QID LOS (Days) 5 A FACE TO FACE EVALUATION WAS PERFORMED  Megan [name] 09/12/2017, 7:53 AM

## 2017-09-12 NOTE — Progress Notes (Signed)
Physical Therapy Session Note  Patient Details  Name: Megan [name] MRN: [id number] Date of Birth: [birth year]  Today's Date: 09/12/2017 PT Individual Time: 1515-1600 PT Individual Time Calculation (min): 45 min   Short Term Goals: Week 1:  PT Short Term Goal 1 (Week 1): Pt will transfer with min A from bed to chair. PT Short Term Goal 2 (Week 1): Standing and ambulation to be assessed.  Skilled Therapeutic Interventions/Progress Updates:    no c/o pain.  Session focus on activity tolerance and NMR.  Pt transitions to EOB, dons TLSO, and performs lateral scoot to w/c on L with supervision.  W/C propulsion to and from therapy gym mod I.    UEB 2x5 min forward/backward for UE strengthening and endurance at level 2.    Sit<>stand x3 in // bars with min assist and cues for forward weight shift, therapist providing manual facilitation for weight bearing through RLE.  In standing, pt completes 1 trial of marching to fatigue, 1 trial of forward/retro stepping 9x, and 1 trial of forward/retro stepping x5', all with min guard and therapist intermittently blocking RLE.    Pt returned to room at end of session and positioned upright in w/c with call bell in reach and needs met.   Therapy Documentation Precautions:  Precautions Precautions: Fall, Back Precaution Booklet Issued: No Required Braces or Orthoses: Spinal Brace Spinal Brace: Thoracolumbosacral orthotic, Applied in sitting position Other Brace/Splint: AFO RLE (not present during eval) Restrictions Weight Bearing Restrictions: No   See Function Navigator for Current Functional Status.   Therapy/Group: Individual Therapy  Michel Santee 09/12/2017, 4:03 PM

## 2017-09-13 ENCOUNTER — Inpatient Hospital Stay (HOSPITAL_COMMUNITY): Payer: BLUE CROSS/BLUE SHIELD | Admitting: Occupational Therapy

## 2017-09-13 ENCOUNTER — Inpatient Hospital Stay (HOSPITAL_COMMUNITY): Payer: BLUE CROSS/BLUE SHIELD | Admitting: Physical Therapy

## 2017-09-13 LAB — IRON AND TIBC
Iron: 26 ug/dL — ABNORMAL LOW (ref 28–170)
Saturation Ratios: 10 % — ABNORMAL LOW (ref 10.4–31.8)
TIBC: 256 ug/dL (ref 250–450)
UIBC: 230 ug/dL

## 2017-09-13 LAB — GLUCOSE, CAPILLARY
GLUCOSE-CAPILLARY: 120 mg/dL — AB (ref 65–99)
GLUCOSE-CAPILLARY: 165 mg/dL — AB (ref 65–99)
Glucose-Capillary: 122 mg/dL — ABNORMAL HIGH (ref 65–99)
Glucose-Capillary: 129 mg/dL — ABNORMAL HIGH (ref 65–99)

## 2017-09-13 LAB — CBC
HCT: 25.5 % — ABNORMAL LOW (ref 36.0–46.0)
Hemoglobin: 7.8 g/dL — ABNORMAL LOW (ref 12.0–15.0)
MCH: 26.2 pg (ref 26.0–34.0)
MCHC: 30.6 g/dL (ref 30.0–36.0)
MCV: 85.6 fL (ref 78.0–100.0)
Platelets: 329 10*3/uL (ref 150–400)
RBC: 2.98 MIL/uL — ABNORMAL LOW (ref 3.87–5.11)
RDW: 15.6 % — ABNORMAL HIGH (ref 11.5–15.5)
WBC: 4.7 10*3/uL (ref 4.0–10.5)

## 2017-09-13 LAB — OCCULT BLOOD X 1 CARD TO LAB, STOOL: Fecal Occult Bld: NEGATIVE

## 2017-09-13 MED ORDER — TAMSULOSIN HCL 0.4 MG PO CAPS
0.4000 mg | ORAL_CAPSULE | Freq: Every day | ORAL | Status: DC
  Administered 2017-09-13 – 2017-09-16 (×4): 0.4 mg via ORAL
  Filled 2017-09-13 (×4): qty 1

## 2017-09-13 NOTE — Progress Notes (Signed)
Occupational Therapy Session Note  Patient Details  Name: Megan [name] MRN: [id number] Date of Birth: 03/19/1951  Today's Date: 09/13/2017 OT Individual Time: 1300-1345 OT Individual Time Calculation (min): 45 min    Short Term Goals: Week 1:  OT Short Term Goal 1 (Week 1): Pt will sit to stand with MOD A of 1 wiht RW to decrease burden of care OT Short Term Goal 2 (Week 1): Pt will stand pivot transfer to BSC with MAX A 1 caregiver OT Short Term Goal 3 (Week 1): Pt will don footwear with AE PRN OT Short Term Goal 4 (Week 1): Pt will bathe LB wiht MIN A PRN  Skilled Therapeutic Interventions/Progress Updates:    Treatment session focused on there activity, transfer training, balance training, pt education, and functional mobility at w/c level. Upon entering room, pt resting in w/c with husband present and agreeable to OT session. Pt maneuvered in w/c down hallway into therapy gym with S. Pt transferred from w/c<>mat table with min A using slide board for seated table top activities for ADLs/IADLs. Completed folding towels with G trunk control, completed moderate difficulty card game with therapist. No c/o discomfort. Returned to room via self propelling in w/c and left with needs met. Therapist provided v/c for safe transfer techniques, balance techniques and energy conservation techniques during session.   Therapy Documentation Precautions:  Precautions Precautions: Fall, Back Precaution Booklet Issued: No Required Braces or Orthoses: Spinal Brace Spinal Brace: Thoracolumbosacral orthotic, Applied in sitting position Other Brace/Splint: AFO RLE (not present during eval) Restrictions Weight Bearing Restrictions: No   Vital Signs: Therapy Vitals Temp: 98.3 F (36.8 C) Temp Source: Oral Pulse Rate: 67 Resp: 18 BP: 135/65 Patient Position (if appropriate): Sitting Oxygen Therapy SpO2: 100 % O2 Device: Not Delivered Pain: Pain Assessment Pain Assessment: Faces Faces Pain  Scale: Hurts a little bit Pain Type: Surgical pain Pain Location: Back Pain Orientation: Mid Pain Descriptors / Indicators: Aching Pain Onset: On-going Patients Stated Pain Goal: 0 Pain Intervention(s): Repositioned Multiple Pain Sites: No ADL: ADL ADL Comments: see functional navigator Vision  See Function Navigator for Current Functional Status.   Therapy/Group: Individual Therapy  Molly  Willson 09/13/2017, 3:41 PM  

## 2017-09-13 NOTE — Progress Notes (Signed)
Physical Therapy Session Note  Patient Details  Name: Megan [name] MRN: [id number] Date of Birth: [birth year]  Today's Date: 09/13/2017 PT Individual Time: 1130-1200 and 1425-1510 PT Individual Time Calculation (min): 30 min and 45 min (total 75 min)   Short Term Goals: Week 1:  PT Short Term Goal 1 (Week 1): Pt will transfer with min A from bed to chair. PT Short Term Goal 2 (Week 1): Standing and ambulation to be assessed.  Skilled Therapeutic Interventions/Progress Updates:    no c/o pain.  Missed first 30 minutes due to needing to be cathed by nursing staff.    Session focus on weight bearing, NMR, and pre-gait in // bars.  Pt able to transition sit<>stand x3 in // bars focus on pushing up with RUE to reduce pt reliance on pulling to stand.  In standing pt able to take steps forward/backward x2 lengths of // bars for each stand.  Rest breaks in between for fatigue.  Education provided to pt regarding importance of weight bearing and standing for improved functional independence.    Pt returned to room at end of session and positioned upright to await lunch tray.  Call bell in reach and needs met.    Session 2: Session focus on strengthening and tone/spasm management.  Pt propels w/c throughout unit mod I.  Transfers to/from nustep with steady assist.  Pt completes 12 minutes on nustep at level 3, focus on activation of RLE, strengthening, and activity tolerance with decreased reliance on UEs.  PT provided PROM stretching to bilateral hamstrings and heel cords 3x30 seconds each in sitting.  Pt returned to room at end of session and positioned upright in w/c with call bell in reach and needs met.   Therapy Documentation Precautions:  Precautions Precautions: Fall, Back Precaution Booklet Issued: No Required Braces or Orthoses: Spinal Brace Spinal Brace: Thoracolumbosacral orthotic, Applied in sitting position Other Brace/Splint: AFO RLE (not present during eval) Restrictions Weight  Bearing Restrictions: No General: PT Amount of Missed Time (min): 30 Minutes PT Missed Treatment Reason: Nursing care(I/O cath)   See Function Navigator for Current Functional Status.   Therapy/Group: Individual Therapy  Michel Santee 09/13/2017, 4:00 PM

## 2017-09-13 NOTE — Progress Notes (Signed)
Occupational Therapy Session Note  Patient Details  Name: Megan [name] MRN: [id number] Date of Birth: [birth year]  Today's Date: 09/13/2017 OT Individual Time: 0910-6816 OT Individual Time Calculation (min): 50 min    Short Term Goals: Week 1:  OT Short Term Goal 1 (Week 1): Pt will sit to stand with MOD A of 1 wiht RW to decrease burden of care OT Short Term Goal 2 (Week 1): Pt will stand pivot transfer to [facility] with MAX A 1 caregiver OT Short Term Goal 3 (Week 1): Pt will don footwear with AE PRN OT Short Term Goal 4 (Week 1): Pt will bathe LB wiht MIN A PRN     Skilled Therapeutic Interventions/Progress Updates:    pt seen this session for self care training with a focus on active use of LE and transfers. Pt received in bed and opted to bathe bed level.  She bathed LB except for below knees. She actively tried to flex R knee and lift leg using hip flexors for therapist to assist her with wash foot, starting pants over feet,  donning TED hose and ACE wraps. She independently rolled to don pants over hips.   From EOB donned spinal brace.  Steadying A with supine to sit to EOB.  She then used a scoot transfer with S to w/c.  Pt in w/c to complete grooming at the sink with all needs met.  Therapy Documentation Precautions:  Precautions Precautions: Fall, Back Precaution Booklet Issued: No Required Braces or Orthoses: Spinal Brace Spinal Brace: Thoracolumbosacral orthotic, Applied in sitting position Other Brace/Splint: AFO RLE (not present during eval) Restrictions Weight Bearing Restrictions: No   Pain: Pain Assessment Pain Assessment: 0-10 Pain Score: 4  Pain Type: Surgical pain Pain Location: Back Pain Orientation: Lower;Mid;Upper Pain Descriptors / Indicators: Aching Pain Frequency: Intermittent Pain Onset: On-going Pain Intervention(s): Medication (See eMAR) ADL: ADL ADL Comments: see functional navigator  See Function Navigator for Current Functional  Status.   Therapy/Group: Individual Therapy  SAGUIER,JULIA 09/13/2017, 12:07 PM

## 2017-09-13 NOTE — Progress Notes (Signed)
Subjective/Complaints:  No issues overnite, discussed bladder issues and bloodwork  ROS-- no abd pain , no N/V, having sm BM daily, no SOB or cough, unable to void Objective: Vital Signs: Blood pressure (!) 152/71, pulse 71, temperature 98.3 F (36.8 C), temperature source Oral, resp. rate 18, SpO2 97 %. No results found. Results for orders placed or performed during the hospital encounter of 09/07/17 (from the past 72 hour(s))  Glucose, capillary     Status: Abnormal   Collection Time: 09/10/17 11:59 AM  Result Value Ref Range   Glucose-Capillary 159 (H) 65 - 99 mg/dL  Glucose, capillary     Status: Abnormal   Collection Time: 09/10/17  4:45 PM  Result Value Ref Range   Glucose-Capillary 153 (H) 65 - 99 mg/dL  Glucose, capillary     Status: Abnormal   Collection Time: 09/10/17  9:38 PM  Result Value Ref Range   Glucose-Capillary 209 (H) 65 - 99 mg/dL  CBC     Status: Abnormal   Collection Time: 09/11/17  6:03 AM  Result Value Ref Range   WBC 5.5 4.0 - 10.5 K/uL   RBC 2.46 (L) 3.87 - 5.11 MIL/uL   Hemoglobin 6.5 (LL) 12.0 - 15.0 g/dL    Comment: REPEATED TO VERIFY CRITICAL RESULT CALLED TO, READ BACK BY AND VERIFIED WITH: D.COLLIEO,RN [id number] 09/11/17 G.MCADOO    HCT 21.1 (L) 36.0 - 46.0 %   MCV 85.8 78.0 - 100.0 fL   MCH 26.4 26.0 - 34.0 pg   MCHC 30.8 30.0 - 36.0 g/dL   RDW 15.5 11.5 - 15.5 %   Platelets 316 150 - 400 K/uL  Glucose, capillary     Status: Abnormal   Collection Time: 09/11/17  6:41 AM  Result Value Ref Range   Glucose-Capillary 151 (H) 65 - 99 mg/dL  Prepare RBC     Status: None   Collection Time: 09/11/17  9:38 AM  Result Value Ref Range   Order Confirmation ORDER PROCESSED BY BLOOD BANK   Type and screen [place]     Status: None   Collection Time: 09/11/17  9:38 AM  Result Value Ref Range   ABO/RH(D) A POS    Antibody Screen NEG    Sample Expiration 09/14/2017    Unit Number [id number]    Blood Component Type RED  CELLS,LR    Unit division 00    Status of Unit ISSUED,FINAL    Transfusion Status OK TO TRANSFUSE    Crossmatch Result Compatible   Glucose, capillary     Status: Abnormal   Collection Time: 09/11/17 11:35 AM  Result Value Ref Range   Glucose-Capillary 141 (H) 65 - 99 mg/dL  Glucose, capillary     Status: Abnormal   Collection Time: 09/11/17  4:27 PM  Result Value Ref Range   Glucose-Capillary 156 (H) 65 - 99 mg/dL  Glucose, capillary     Status: Abnormal   Collection Time: 09/11/17  9:00 PM  Result Value Ref Range   Glucose-Capillary 167 (H) 65 - 99 mg/dL  Glucose, capillary     Status: Abnormal   Collection Time: 09/12/17  6:33 AM  Result Value Ref Range   Glucose-Capillary 136 (H) 65 - 99 mg/dL  Hemoglobin and hematocrit, blood     Status: Abnormal   Collection Time: 09/12/17  7:18 AM  Result Value Ref Range   Hemoglobin 8.4 (L) 12.0 - 15.0 g/dL    Comment: POST TRANSFUSION SPECIMEN   HCT 27.3 (  L) 36.0 - 46.0 %  Glucose, capillary     Status: Abnormal   Collection Time: 09/12/17 11:43 AM  Result Value Ref Range   Glucose-Capillary 146 (H) 65 - 99 mg/dL  Glucose, capillary     Status: Abnormal   Collection Time: 09/12/17  4:37 PM  Result Value Ref Range   Glucose-Capillary 115 (H) 65 - 99 mg/dL  Glucose, capillary     Status: Abnormal   Collection Time: 09/12/17  9:26 PM  Result Value Ref Range   Glucose-Capillary 163 (H) 65 - 99 mg/dL   Comment 1 Notify RN   Glucose, capillary     Status: Abnormal   Collection Time: 09/13/17  6:29 AM  Result Value Ref Range   Glucose-Capillary 122 (H) 65 - 99 mg/dL   Comment 1 Notify RN   Iron and TIBC     Status: Abnormal   Collection Time: 09/13/17  6:33 AM  Result Value Ref Range   Iron 26 (L) 28 - 170 ug/dL   TIBC 256 250 - 450 ug/dL   Saturation Ratios 10 (L) 10.4 - 31.8 %   UIBC 230 ug/dL  CBC     Status: Abnormal   Collection Time: 09/13/17  6:33 AM  Result Value Ref Range   WBC 4.7 4.0 - 10.5 K/uL   RBC 2.98 (L) 3.87  - 5.11 MIL/uL   Hemoglobin 7.8 (L) 12.0 - 15.0 g/dL   HCT 25.5 (L) 36.0 - 46.0 %   MCV 85.6 78.0 - 100.0 fL   MCH 26.2 26.0 - 34.0 pg   MCHC 30.6 30.0 - 36.0 g/dL   RDW 15.6 (H) 11.5 - 15.5 %   Platelets 329 150 - 400 K/uL     HEENT: normal Cardio: RRR and no murmur Resp: CTA B/L and unlabored GI: BS positive and NT, ND Extremity:  No Edema Skin:   Wound C/D/I and thoracolumbar Neuro: Alert and oriented, Abnormal Sensory reduce LT left S1, level of alertness limits testing and Abnormal Motor 5/5 in BUE, 3- B HF, 4- B KE, 3- Bilateral ADF Musc/Skel:  Other neg SLR, no Knee or ankle joint swelling or erythema, no hand wrist or elbow erythema or swelling Gen NAD   Assessment/Plan: 1. Functional deficits secondary to L3 body fracture and malpositioning of S1 screw requiring redo T12-L4 fusion and repostioning of S1 screw which require 3+ hours per day of interdisciplinary therapy in a comprehensive inpatient rehab setting. Physiatrist is providing close team supervision and 24 hour management of active medical problems listed below. Physiatrist and rehab team continue to assess barriers to discharge/monitor patient progress toward functional and medical goals. FIM: Function - Bathing Position: Bed Body parts bathed by patient: Right arm, Abdomen, Chest, Left arm, Right upper leg, Left upper leg Body parts bathed by helper: Left lower leg, Right lower leg Bathing not applicable: Front perineal area, Buttocks, Back Assist Level: Touching or steadying assistance(Pt > 75%)  Function- Upper Body Dressing/Undressing What is the patient wearing?: Bra, Pull over shirt/dress Bra - Perfomed by patient: Thread/unthread right bra strap, Thread/unthread left bra strap Bra - Perfomed by helper: Hook/unhook bra (pull down sports bra) Pull over shirt/dress - Perfomed by patient: Thread/unthread right sleeve, Thread/unthread left sleeve, Pull shirt over trunk, Put head through opening Orthosis  activity level: Performed by patient Assist Level: Touching or steadying assistance(Pt > 75%) Set up : To obtain clothing/put away Function - Lower Body Dressing/Undressing What is the patient wearing?: Pants, Maryln Manuel, Shoes Position:  Sitting EOB Underwear - Performed by helper: Thread/unthread right underwear leg, Thread/unthread left underwear leg, Pull underwear up/down Pants- Performed by patient: Pull pants up/down Pants- Performed by helper: Thread/unthread right pants leg, Thread/unthread left pants leg Non-skid slipper socks- Performed by helper: Don/doff right sock, Don/doff left sock Shoes - Performed by patient: Don/doff right shoe, Don/doff left shoe, Fasten right, Fasten left TED Hose - Performed by helper: Don/doff right TED hose, Don/doff left TED hose Assist for footwear: Dependant Assist for lower body dressing: Assistive device Assistive Device Comment: Reacher + shoe funnel Set up : To obtain clothing/put away  Function - Toileting Toileting activity did not occur: No continent bowel/bladder event Toileting steps completed by patient: Performs perineal hygiene Toileting steps completed by helper: Adjust clothing prior to toileting, Performs perineal hygiene, Adjust clothing after toileting Toileting Assistive Devices: Grab bar or rail Assist level: Touching or steadying assistance (Pt.75%)  Function - Air cabin crew transfer activity did not occur: Safety/medical concerns Toilet transfer assistive device: Drop arm commode Assist level to toilet: Touching or steadying assistance (Pt > 75%) Assist level from toilet: Touching or steadying assistance (Pt > 75%)  Function - Chair/bed transfer Chair/bed transfer method: Lateral scoot Chair/bed transfer assist level: Touching or steadying assistance (Pt > 75%) Chair/bed transfer assistive device: Armrests Chair/bed transfer details: Verbal cues for precautions/safety, Verbal cues for technique, Verbal cues for  sequencing  Function - Locomotion: Wheelchair Will patient use wheelchair at discharge?: Yes Type: Manual Max wheelchair distance: 150' Assist Level: No help, No cues, assistive device, takes more than reasonable amount of time Assist Level: No help, No cues, assistive device, takes more than reasonable amount of time Assist Level: No help, No cues, assistive device, takes more than reasonable amount of time Turns around,maneuvers to table,bed, and toilet,negotiates 3% grade,maneuvers on rugs and over doorsills: Yes Function - Locomotion: Ambulation Ambulation activity did not occur: Safety/medical concerns  Function - Comprehension Comprehension: Auditory Comprehension assist level: Follows complex conversation/direction with no assist  Function - Expression Expression: Verbal Expression assist level: Expresses complex ideas: With no assist  Function - Social Interaction Social Interaction assist level: Interacts appropriately with others - No medications needed.  Function - Problem Solving Problem solving assist level: Solves complex problems: Recognizes & self-corrects  Function - Memory Memory assist level: Complete Independence: No helper Patient normally able to recall (first 3 days only): Current season, Location of own room, Staff names and faces, That he or she is in a hospital  Medical Problem List and Plan: 1.  Decreased functional mobility secondary to L3 body fracture with compression and posterior displacement and repositioning of lateral sacral screw extension of lumbar fusion to thoracic T12 09/02/2017 as well as history of multiple thoracic lumbar surgeries. Back brace when out of bed. CIR PT, OT, team conf in am 2.  DVT Prophylaxis/Anticoagulation: SCDs. Check vascular study 3. Pain Management/chronic back pain: Baclofen 10 mg 3 times a day, MS Contin 30 mg every 12 hours, Neurontin 300 mg 3 times a day, Celebrex 200 mg every 12 hours, Valium for muscle spasms and  hydrocodone for breakthrough pain as needed  4. Mood: Provide emotional support 5. Neuropsych: This patient is capable of making decisions on her own behalf. 6. Skin/Wound Care: Routine skin checks 7. Fluids/Electrolytes/Nutrition: Routine I&O's with follow-up chemistries 8. Acute blood loss anemia. Follow-up CBC improved from 6/5 to 8.4 now drifted down to  7.8 after transfusion of 1 Unit 12/30 9. Diabetes mellitus peripheral neuropathy. SSI. Hemoglobin A1c  6.0. Check blood sugars before meals and at bedtime. Patient on Glucophage 500 mg twice a day prior to admission. Resume as needed CBG (last 3) controlled Recent Labs    09/12/17 1637 09/12/17 2126 09/13/17 0629  GLUCAP 115* 163* 122*  Controlled 1/1 10. Hypertension. Lisinopril 5 mg daily Vitals:   09/12/17 1358 09/13/17 0346  BP: 123/63 (!) 152/71  Pulse: 68 71  Resp:  18  Temp:  98.3 F (36.8 C)  SpO2:  88%  Systolic elevation 1/1 monitor 11. Neurogenic bladder. Intermittent catheterizations 3 times a day as prior to admission.having inc void between caths pt would like to be on meds to empty bladder in hope of regaining ability to void , was able to void up until lumbar surgery in Nov 12. Hyperlipidemia. Lipitor  13.  Lethargy- Improved was med related MSO4 and valium     14.  Hypoalb- Prostat 15.  Spasticity RLE adductors Hamstrings and ankle plantar flexors and invertors increase baclofen to QID LOS (Days) 6 A FACE TO FACE EVALUATION WAS PERFORMED  Charlett Blake 09/13/2017, 10:26 AM

## 2017-09-14 ENCOUNTER — Inpatient Hospital Stay (HOSPITAL_COMMUNITY): Payer: BLUE CROSS/BLUE SHIELD | Admitting: Occupational Therapy

## 2017-09-14 ENCOUNTER — Inpatient Hospital Stay (HOSPITAL_COMMUNITY): Payer: BLUE CROSS/BLUE SHIELD | Admitting: Physical Therapy

## 2017-09-14 ENCOUNTER — Encounter (HOSPITAL_COMMUNITY): Payer: BLUE CROSS/BLUE SHIELD | Admitting: Psychology

## 2017-09-14 ENCOUNTER — Encounter (HOSPITAL_COMMUNITY): Payer: Self-pay

## 2017-09-14 LAB — GLUCOSE, CAPILLARY
GLUCOSE-CAPILLARY: 127 mg/dL — AB (ref 65–99)
Glucose-Capillary: 120 mg/dL — ABNORMAL HIGH (ref 65–99)
Glucose-Capillary: 138 mg/dL — ABNORMAL HIGH (ref 65–99)
Glucose-Capillary: 165 mg/dL — ABNORMAL HIGH (ref 65–99)

## 2017-09-14 NOTE — Consult Note (Signed)
Neuropsychological Consultation   Patient:   Megan [name]   DOB:   07-05-1951  MR Number:  [id number]  Location:  [facility] [address] [id number] [place] [address] Dept: [place]: [phone]           Date of Service:   09/14/2017  Start Time:   10 AM End Time:   11 AM  Provider/Observer:  Ilean Skill, Psy.D.       Clinical Neuropsychologist       Billing Code/Service: [phone] Units  Chief Complaint:    [name] is a 67 year old female with history of diabetes mellitus, chronic back pain and multiple thoracic and lumbar surgeries.  Most recent surgery included lumbar 3-4 redo laminectomy repositioning of lateral sacral screw extension of lumbar fusion to thoracic 12.  The patient has been coping with year long multi surgery intervention of thoracic and lumbar surgeries and subsequent physical changes and limitations.  The patient has been experiencing frustration and mild depresive responses with lack of ability to do things around house and the need for her husband to do so many more of household choirs.    Reason for Service:  Juliani Laduke was referred from neuropsychological/psychological consultation for adjustment and coping issues.  Below is the HPI for the current admission.    HPI: Megan [name] a 67 y.o.right handed femalewith history of diabetes mellitus, chronic back pain as well as multiple prior thoracic or lumbar surgeries/neurogenic bladder with catheterization 3 times daily maintained on Urecholine and Flomax and received inpatient rehabilitation services 07/28/2017-08/19/2017 after multilevel lumbar laminectomy with lateral fixation.History taken from chart review and patient.She was discharged home with spouse. Supervision for wheelchair to mat transfers. She was standing in the standing frame predominantly using her wheelchair at home.Her husband  works but can assist as needed as well as a daughter.Presented 08/29/2017 with nausea, vomiting, abdominal pain and frequent bowel movements. CT abdomen pelvis revealed distended rectum by stool. Also noted L3 body fracture with compression and posterior displacement. Bowel program was regulated. Neurosurgery follow-up underwent lumbar 3-4 redo laminectomy repositioning of lateral sacral screw extension of lumbar fusion to thoracic 12 09/02/2017 per Dr.Ditty. Hospital course pain management. Patient required wound VAC and Hemovac. Lumbar corset when out of bed applied in sitting position. Treated for Escherichia coli UTI with Rocephin completed 09/05/2017. Acute on chronic anemia 7.0 and monitored. Physical and occupational therapy evaluations completed with recommendations of physical medicine rehabilitation consult. Patient was admitted for a comprehensive rehabilitation program  Current Status:  The patient was tearful talking about how much she has needed help and assistance from husband over past year.  She reports that he loss of function have been upsetting, but she feels she is getting stronger and is seeing improvements in physical therapy.    Behavioral Observation: [name]  presents as a 67 y.o.-year-old Right Caucasian Female who appeared her stated age. her dress was Appropriate and she was Well Groomed and her manners were Appropriate to the situation.  her participation was indicative of Appropriate and Attentive behaviors.  There were physical disabilities noted.  she displayed an appropriate level of cooperation and motivation.     Interactions:    Active Appropriate and Attentive  Attention:   within normal limits and attention span and concentration were age appropriate  Memory:   within normal limits; recent and remote memory intact  Visuo-spatial:  not examined  Speech (Volume):  low  Speech:   normal; normal  Thought Process:  Coherent and Relevant  Though  Content:  WNL; not suicidal  Orientation:   person, place, time/date and situation  Judgment:   Good  Planning:   Good  Affect:    Depressed  Mood:    Depressed, but appropriate to situation.  Insight:   Good  Intelligence:   normal  Marital Status/Living: Patient is married and has supportive husband.  Medical History:   Past Medical History:  Diagnosis Date  . Arthritis    "hands, back" (07/26/2017)  . Dyspnea    W/ PHYS CONDITION   . GERD (gastroesophageal reflux disease)   . History of bronchitis    "not since I quit smoking" (07/26/2017)  . History of kidney stones   . History of shingles   . Hyperlipidemia    takes Fish Oil daily  . Hypertension   . Neuromuscular disorder (HCC)    tingling toes  . 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 Pneumonia 2009  . PONV (postoperative nausea and vomiting)   . Restless leg   . Type 2 diabetes mellitus ([facility])   . Uterine cancer ([facility]) 1979   S/P hysterectomy  . Weakness    numbness and tingling in both feet r/t back     Family Med/Psych History:  Family History  Problem Relation Age of Onset  . Diabetes Mother   . Dementia Mother   . Cirrhosis Mother        Non alcoholic  . COPD Father   . Diabetes Brother     Risk of Suicide/Violence: virtually non-existent   Impression/DX:  Megan [name] is a 67 year old female with history of diabetes mellitus, chronic back pain and multiple thoracic and lumbar surgeries.  Most recent surgery included lumbar 3-4 redo laminectomy repositioning of lateral sacral screw extension of lumbar fusion to thoracic 12.  The patient has been coping with year long multi surgery intervention of thoracic and lumbar surgeries and subsequent physical changes and limitations.  The patient has been experiencing frustration and mild depresive responses with lack of ability to do things around house and the need for her husband to do so many more of household choirs.    The patient was  tearful talking about how much she has needed help and assistance from husband over past year.  She reports that he loss of function have been upsetting, but she feels she is getting stronger and is seeing improvements in physical therapy.    Disposition/Plan:  Worked on issues of coping and adjustment to situation regarding mobility and pain.  Will see the patient again first of next week.  Diagnosis:    Myelopathy        Electronically Signed   _______________________ Ilean Skill, Psy.D.

## 2017-09-14 NOTE — Progress Notes (Signed)
Physical Therapy Session Note  Patient Details  Name: Megan [name] MRN: [id number] Date of Birth: [birth year]  Today's Date: 09/14/2017 PT Individual Time: 0900-0955 PT Individual Time Calculation (min): 55 min   Short Term Goals: Week 1:  PT Short Term Goal 1 (Week 1): Pt will transfer with min A from bed to chair. PT Short Term Goal 2 (Week 1): Standing and ambulation to be assessed.  Skilled Therapeutic Interventions/Progress Updates:   Pt received sitting in WC and agreeable to PT. Pt propelled WC to rehab gym in [facility] without cues or assist from PT. PT instructed pt in sit<>stand in parallel bars with Contact guard assist from PT. Gait in parallel bars instructed by PT x 5 ft forward and back. PT instructed pt in sit<>stand from [facility] with RW and min assist from PT. Gait with RW 7f forward and backward x 2 with min assist. Pt also instructed gait with RW x 380fmin assist from PT with cues for gait pattern and AD management in turns. Stand [facility] following gait due to increased clonus with fatigue in the RLE. PT instructed in BUE arm ergometry. 2 bouts, 3 min forward and 3 min backward lvl 5 progressing to lvl 6.5.  [facility] returned to [facility] and [facility] [place] [facility] [place] reach and all needs met.         Therapy Documentation Precautions:  Precautions Precautions: Fall, Back Precaution Booklet Issued: No Required Braces or Orthoses: Spinal Brace Spinal Brace: Thoracolumbosacral orthotic, Applied in sitting position Other Brace/Splint: AFO RLE (not present during eval) Restrictions Weight Bearing Restrictions: No Pain: Pain Assessment Pain Assessment: 0-10 Pain Score: 2  Pain Type: Surgical pain Pain Location: Back Pain Orientation: Lower Pain Descriptors / Indicators: Pressure Pain Frequency: Intermittent Pain Onset: On-going Pain Intervention(s): Medication (See eMAR)   See Function Navigator for Current Functional  Status.   Therapy/Group: Individual Therapy  AuLorie Phenix/10/2017, 9:54 AM

## 2017-09-14 NOTE — Progress Notes (Signed)
Occupational Therapy Session Note  Patient Details  Name: Megan [name] MRN: [id number] Date of Birth: 1951/05/05  Today's Date: 09/14/2017 OT Individual Time: 5701-7793 OT Individual Time Calculation (min): 70 min    Short Term Goals: Week 1:  OT Short Term Goal 1 (Week 1): Pt will sit to stand with MOD A of 1 wiht RW to decrease burden of care OT Short Term Goal 2 (Week 1): Pt will stand pivot transfer to [facility] with MAX A 1 caregiver OT Short Term Goal 3 (Week 1): Pt will don footwear with AE PRN OT Short Term Goal 4 (Week 1): Pt will bathe LB wiht MIN A PRN  Skilled Therapeutic Interventions/Progress Updates:    Upon entering the room, pt supine in bed with no c/o pain this session. OT provided total A to don B TED hose and ACE wraps on LEs while pt finished eating breakfast. Pt rolling L <> R with supervision and use of bed rails to wash buttocks and peri area this session. Pt required assistance to thread pants but able to roll and pull over B hips herself. Pt transitioned from supine>sit with min A for balance to EOB. Pt performed UB bathing and dressing with close supervision for balance and set up A to obtain all needed items. Pt performed lateral scoot transfer from bed >wheelchair and propelled to sink for grooming tasks. All needed items within reach upon exiting the room.   Therapy Documentation Precautions:  Precautions Precautions: Fall, Back Precaution Booklet Issued: No Required Braces or Orthoses: Spinal Brace Spinal Brace: Thoracolumbosacral orthotic, Applied in sitting position Other Brace/Splint: AFO RLE (not present during eval) Restrictions Weight Bearing Restrictions: No Pain: Pain Assessment Pain Assessment: 0-10 Pain Score: 3  Pain Type: Surgical pain Pain Location: Back Pain Orientation: Mid;Lower Pain Descriptors / Indicators: Aching;Discomfort Pain Frequency: Intermittent Pain Onset: On-going Pain Intervention(s): Medication (See eMAR) ADL: ADL ADL  Comments: see functional navigator Vision   Perception    Praxis   Exercises:   Other Treatments:    See Function Navigator for Current Functional Status.   Therapy/Group: Individual Therapy  Gypsy Decant 09/14/2017, 8:55 AM

## 2017-09-14 NOTE — Progress Notes (Signed)
Occupational Therapy Session Note  Patient Details  Name: Megan [name] MRN: [id number] Date of Birth: [birth year]  Today's Date: 09/14/2017  Session 1 OT Individual Time: 1130-1200 OT Individual Time Calculation (min): 30 min   Session 2 OT Individual Time: 1345-1415 OT Individual Time Calculation (min): 30 min   Short Term Goals: Week 1:  OT Short Term Goal 1 (Week 1): Pt will sit to stand with MOD A of 1 wiht RW to decrease burden of care OT Short Term Goal 2 (Week 1): Pt will stand pivot transfer to [facility] with MAX A 1 caregiver OT Short Term Goal 3 (Week 1): Pt will don footwear with AE PRN OT Short Term Goal 4 (Week 1): Pt will bathe LB wiht MIN A PRN  Skilled Therapeutic Interventions/Progress Updates:    Session 1 OT treatment session focused on B UE strengthening with hair washing task, improved sit<>stand, and functional ambulation. Pt washed hair with set-up A and 1 rest break 2/2 UE fatigue. Pt then completed 4 sit<>stands, ambulated 3 feet forward and backward, then took seated rest break. Pt needed to be cathed, so pt stood for 5th time, took 4 steps forward, and 4 steps backward to sit at EOB. Min A + Leg lifter to lift R LE back into bed. Pt left semi-reclined in bed with needs met.   Session 2 OT treatment session focused on standing endurance/balance and functional ambulation. Pt propelled wc to therapy gym, then completed stand-step turn transfer to therapy mat with close supervision. Pt tolerated 3 mins standing with unilateral UE support while reaching to complete clothes-pin task. Graded activity by having pt remove clothes pins using L UE to promote weight shift onto R LE-overall min guard A/supervision for balance. Pt then ambulated 23 feet with RW, min A and wc follow. Pt propelled wc back to room and left seated in wc with needs met.  Therapy Documentation Precautions:  Precautions Precautions: Fall, Back Precaution Booklet Issued: No Required Braces or  Orthoses: Spinal Brace Spinal Brace: Thoracolumbosacral orthotic, Applied in sitting position Other Brace/Splint: AFO RLE (not present during eval) Restrictions Weight Bearing Restrictions: No Pain:  none/denies pain ADL: ADL ADL Comments: see functional navigator  See Function Navigator for Current Functional Status.   Therapy/Group: Individual Therapy  [name] 09/14/2017, 2:17 PM

## 2017-09-14 NOTE — Progress Notes (Signed)
Subjective/Complaints:  High volume cath, discussed with RN, some overflow inc prior to cath but generally dry between caths Discussed scopolamine which was ordered for post op nausea   ROS-- no abd pain , no N/V, having sm BM daily, no SOB or cough, unable to void Objective: Vital Signs: Blood pressure 139/70, pulse 86, temperature 97.7 F (36.5 C), temperature source Oral, resp. rate 17, height _0  (1.727 m), SpO2 99 %. No results found. Results for orders placed or performed during the hospital encounter of 09/07/17 (from the past 72 hour(s))  Prepare RBC     Status: None   Collection Time: 09/11/17  9:38 AM  Result Value Ref Range   Order Confirmation ORDER PROCESSED BY BLOOD BANK   Type and screen [place]     Status: None   Collection Time: 09/11/17  9:38 AM  Result Value Ref Range   ABO/RH(D) A POS    Antibody Screen NEG    Sample Expiration 09/14/2017    Unit Number [id number]    Blood Component Type RED CELLS,LR    Unit division 00    Status of Unit ISSUED,FINAL    Transfusion Status OK TO TRANSFUSE    Crossmatch Result Compatible   Glucose, capillary     Status: Abnormal   Collection Time: 09/11/17 11:35 AM  Result Value Ref Range   Glucose-Capillary 141 (H) 65 - 99 mg/dL  Glucose, capillary     Status: Abnormal   Collection Time: 09/11/17  4:27 PM  Result Value Ref Range   Glucose-Capillary 156 (H) 65 - 99 mg/dL  Glucose, capillary     Status: Abnormal   Collection Time: 09/11/17  9:00 PM  Result Value Ref Range   Glucose-Capillary 167 (H) 65 - 99 mg/dL  Glucose, capillary     Status: Abnormal   Collection Time: 09/12/17  6:33 AM  Result Value Ref Range   Glucose-Capillary 136 (H) 65 - 99 mg/dL  Hemoglobin and hematocrit, blood     Status: Abnormal   Collection Time: 09/12/17  7:18 AM  Result Value Ref Range   Hemoglobin 8.4 (L) 12.0 - 15.0 g/dL    Comment: POST TRANSFUSION SPECIMEN   HCT 27.3 (L) 36.0 - 46.0 %  Glucose,  capillary     Status: Abnormal   Collection Time: 09/12/17 11:43 AM  Result Value Ref Range   Glucose-Capillary 146 (H) 65 - 99 mg/dL  Glucose, capillary     Status: Abnormal   Collection Time: 09/12/17  4:37 PM  Result Value Ref Range   Glucose-Capillary 115 (H) 65 - 99 mg/dL  Glucose, capillary     Status: Abnormal   Collection Time: 09/12/17  9:26 PM  Result Value Ref Range   Glucose-Capillary 163 (H) 65 - 99 mg/dL   Comment 1 Notify RN   Occult blood card to lab, stool     Status: None   Collection Time: 09/13/17  5:00 AM  Result Value Ref Range   Fecal Occult Bld NEGATIVE NEGATIVE  Glucose, capillary     Status: Abnormal   Collection Time: 09/13/17  6:29 AM  Result Value Ref Range   Glucose-Capillary 122 (H) 65 - 99 mg/dL   Comment 1 Notify RN   Iron and TIBC     Status: Abnormal   Collection Time: 09/13/17  6:33 AM  Result Value Ref Range   Iron 26 (L) 28 - 170 ug/dL   TIBC 256 250 - 450 ug/dL   Saturation  Ratios 10 (L) 10.4 - 31.8 %   UIBC 230 ug/dL  CBC     Status: Abnormal   Collection Time: 09/13/17  6:33 AM  Result Value Ref Range   WBC 4.7 4.0 - 10.5 K/uL   RBC 2.98 (L) 3.87 - 5.11 MIL/uL   Hemoglobin 7.8 (L) 12.0 - 15.0 g/dL   HCT 25.5 (L) 36.0 - 46.0 %   MCV 85.6 78.0 - 100.0 fL   MCH 26.2 26.0 - 34.0 pg   MCHC 30.6 30.0 - 36.0 g/dL   RDW 15.6 (H) 11.5 - 15.5 %   Platelets 329 150 - 400 K/uL  Glucose, capillary     Status: Abnormal   Collection Time: 09/13/17 11:29 AM  Result Value Ref Range   Glucose-Capillary 129 (H) 65 - 99 mg/dL  Glucose, capillary     Status: Abnormal   Collection Time: 09/13/17  4:47 PM  Result Value Ref Range   Glucose-Capillary 120 (H) 65 - 99 mg/dL  Glucose, capillary     Status: Abnormal   Collection Time: 09/13/17  9:05 PM  Result Value Ref Range   Glucose-Capillary 165 (H) 65 - 99 mg/dL  Glucose, capillary     Status: Abnormal   Collection Time: 09/14/17  6:43 AM  Result Value Ref Range   Glucose-Capillary 120 (H) 65 -  99 mg/dL     HEENT: normal Cardio: RRR and no murmur Resp: CTA B/L and unlabored GI: BS positive and NT, ND Extremity:  No Edema Skin:   Wound C/D/I and thoracolumbar Neuro: Alert and oriented, Abnormal Sensory reduce LT left S1, level of alertness limits testing and Abnormal Motor 5/5 in BUE, 3- B HF, 4- B KE, 3- Bilateral ADF Musc/Skel:  Other neg SLR, no Knee or ankle joint swelling or erythema, no hand wrist or elbow erythema or swelling Gen NAD   Assessment/Plan: 1. Functional deficits secondary to L3 body fracture and malpositioning of S1 screw requiring redo T12-L4 fusion and repostioning of S1 screw which require 3+ hours per day of interdisciplinary therapy in a comprehensive inpatient rehab setting. Physiatrist is providing close team supervision and 24 hour management of active medical problems listed below. Physiatrist and rehab team continue to assess barriers to discharge/monitor patient progress toward functional and medical goals. FIM: Function - Bathing Position: Bed Body parts bathed by patient: Right arm, Abdomen, Chest, Left arm, Right upper leg, Left upper leg Body parts bathed by helper: Left lower leg, Right lower leg Bathing not applicable: Front perineal area, Buttocks, Back Assist Level: Touching or steadying assistance(Pt > 75%)  Function- Upper Body Dressing/Undressing What is the patient wearing?: Bra, Pull over shirt/dress Bra - Perfomed by patient: Thread/unthread right bra strap, Thread/unthread left bra strap Bra - Perfomed by helper: Hook/unhook bra (pull down sports bra) Pull over shirt/dress - Perfomed by patient: Thread/unthread right sleeve, Thread/unthread left sleeve, Pull shirt over trunk, Put head through opening Orthosis activity level: Performed by patient Assist Level: Touching or steadying assistance(Pt > 75%) Set up : To obtain clothing/put away Function - Lower Body Dressing/Undressing What is the patient wearing?: Pants, Ted Hose,  Shoes Position: Sitting EOB Underwear - Performed by helper: Thread/unthread right underwear leg, Thread/unthread left underwear leg, Pull underwear up/down Pants- Performed by patient: Pull pants up/down Pants- Performed by helper: Thread/unthread right pants leg, Thread/unthread left pants leg Non-skid slipper socks- Performed by helper: Don/doff right sock, Don/doff left sock Shoes - Performed by patient: Don/doff right shoe, Don/doff left shoe, Fasten right,  Fasten left TED Hose - Performed by helper: Don/doff right TED hose, Don/doff left TED hose Assist for footwear: Dependant Assist for lower body dressing: Assistive device Assistive Device Comment: Reacher + shoe funnel Set up : To obtain clothing/put away  Function - Toileting Toileting activity did not occur: No continent bowel/bladder event Toileting steps completed by patient: Performs perineal hygiene Toileting steps completed by helper: Adjust clothing prior to toileting, Performs perineal hygiene, Adjust clothing after toileting(per Delsa Bern, NT) Toileting Assistive Devices: Grab bar or rail Assist level: Touching or steadying assistance (Pt.75%)  Function - Air cabin crew transfer activity did not occur: Safety/medical concerns Toilet transfer assistive device: Drop arm commode Assist level to toilet: Touching or steadying assistance (Pt > 75%) Assist level from toilet: Touching or steadying assistance (Pt > 75%)  Function - Chair/bed transfer Chair/bed transfer method: Lateral scoot Chair/bed transfer assist level: Touching or steadying assistance (Pt > 75%) Chair/bed transfer assistive device: Armrests Chair/bed transfer details: Verbal cues for precautions/safety, Verbal cues for technique, Verbal cues for sequencing  Function - Locomotion: Wheelchair Will patient use wheelchair at discharge?: Yes Type: Manual Max wheelchair distance: 150' Assist Level: No help, No cues, assistive device,  takes more than reasonable amount of time Assist Level: No help, No cues, assistive device, takes more than reasonable amount of time Assist Level: No help, No cues, assistive device, takes more than reasonable amount of time Turns around,maneuvers to table,bed, and toilet,negotiates 3% grade,maneuvers on rugs and over doorsills: Yes Function - Locomotion: Ambulation Ambulation activity did not occur: Safety/medical concerns  Function - Comprehension Comprehension: Auditory Comprehension assist level: Follows complex conversation/direction with extra time/assistive device  Function - Expression Expression: Verbal Expression assist level: Expresses complex ideas: With extra time/assistive device  Function - Social Interaction Social Interaction assist level: Interacts appropriately with others with medication or extra time (anti-anxiety, antidepressant).  Function - Problem Solving Problem solving assist level: Solves basic problems with no assist  Function - Memory Memory assist level: Complete Independence: No helper Patient normally able to recall (first 3 days only): Current season, Location of own room, Staff names and faces, That he or she is in a hospital  Medical Problem List and Plan: 1.  Decreased functional mobility secondary to L3 body fracture with compression and posterior displacement and repositioning of lateral sacral screw extension of lumbar fusion to thoracic T12 09/02/2017 as well as history of multiple thoracic lumbar surgeries. Back brace when out of bed. CIR PT, OT, Team conference today please see physician documentation under team conference tab, met with team face-to-face to discuss problems,progress, and goals. Formulized individual treatment plan based on medical history, underlying problem and comorbidities. 2.  DVT Prophylaxis/Anticoagulation: SCDs. Check vascular study 3. Pain Management/chronic back pain: Baclofen 10 mg 3 times a day, MS Contin 30 mg every  12 hours, Neurontin 300 mg 3 times a day, Celebrex 200 mg every 12 hours, Valium for muscle spasms and hydrocodone for breakthrough pain as needed  4. Mood: Provide emotional support 5. Neuropsych: This patient is capable of making decisions on her own behalf. 6. Skin/Wound Care: Routine skin checks 7. Fluids/Electrolytes/Nutrition: Routine I&O's with follow-up chemistries 8. Acute blood loss anemia. Follow-up CBC improved from 6/5 to 8.4 now drifted down to  7.8 after transfusion of 1 Unit 12/30, recheck in am 9. Diabetes mellitus peripheral neuropathy. SSI. Hemoglobin A1c 6.0. Check blood sugars before meals and at bedtime. Patient on Glucophage 500 mg twice a day prior to admission. Resume as needed  CBG (last 3) controlled Recent Labs    09/13/17 1647 09/13/17 2105 09/14/17 0643  GLUCAP 120* 165* 120*  Controlled 1/2 10. Hypertension. Lisinopril 5 mg daily Vitals:   09/13/17 1355 09/14/17 0430  BP: 135/65 139/70  Pulse: 67 86  Resp: 18 17  Temp: 98.3 F (36.8 C) 97.7 F (36.5 C)  SpO2: 136% 43%  Systolic elevation 1/1 monitor 11. Neurogenic bladder. Intermittent catheterizations 3 times a day as prior to [place] cath last noc denied increased fluid intake, RN notes cloudy urine.  Will d/c scop patch due to potential for urinary retention   12. Hyperlipidemia. Lipitor  13.  Lethargy- Improved was med related MSO4 and valium     14.  Hypoalb- Prostat 15.  Spasticity RLE adductors Hamstrings and ankle plantar flexors and invertors increase baclofen to QID LOS (Days) 7 A FACE TO FACE EVALUATION WAS PERFORMED  Charlett Blake 09/14/2017, 8:32 AM

## 2017-09-15 ENCOUNTER — Inpatient Hospital Stay (HOSPITAL_COMMUNITY): Payer: BLUE CROSS/BLUE SHIELD | Admitting: Physical Therapy

## 2017-09-15 ENCOUNTER — Telehealth: Payer: Self-pay

## 2017-09-15 ENCOUNTER — Inpatient Hospital Stay (HOSPITAL_COMMUNITY): Payer: BLUE CROSS/BLUE SHIELD | Admitting: Occupational Therapy

## 2017-09-15 LAB — CBC
HEMATOCRIT: 29.8 % — AB (ref 36.0–46.0)
HEMOGLOBIN: 9.1 g/dL — AB (ref 12.0–15.0)
MCH: 26.3 pg (ref 26.0–34.0)
MCHC: 30.5 g/dL (ref 30.0–36.0)
MCV: 86.1 fL (ref 78.0–100.0)
Platelets: 361 10*3/uL (ref 150–400)
RBC: 3.46 MIL/uL — ABNORMAL LOW (ref 3.87–5.11)
RDW: 15.8 % — ABNORMAL HIGH (ref 11.5–15.5)
WBC: 5.5 10*3/uL (ref 4.0–10.5)

## 2017-09-15 LAB — URINALYSIS, ROUTINE W REFLEX MICROSCOPIC
Bilirubin Urine: NEGATIVE
GLUCOSE, UA: NEGATIVE mg/dL
KETONES UR: NEGATIVE mg/dL
Nitrite: POSITIVE — AB
PROTEIN: NEGATIVE mg/dL
Specific Gravity, Urine: 1.01 (ref 1.005–1.030)
pH: 6 (ref 5.0–8.0)

## 2017-09-15 LAB — GLUCOSE, CAPILLARY
GLUCOSE-CAPILLARY: 161 mg/dL — AB (ref 65–99)
GLUCOSE-CAPILLARY: 157 mg/dL — AB (ref 65–99)
Glucose-Capillary: 135 mg/dL — ABNORMAL HIGH (ref 65–99)
Glucose-Capillary: 147 mg/dL — ABNORMAL HIGH (ref 65–99)

## 2017-09-15 LAB — OCCULT BLOOD X 1 CARD TO LAB, STOOL: Fecal Occult Bld: NEGATIVE

## 2017-09-15 MED ORDER — SULFAMETHOXAZOLE-TRIMETHOPRIM 800-160 MG PO TABS
1.0000 | ORAL_TABLET | Freq: Two times a day (BID) | ORAL | Status: DC
  Administered 2017-09-15 – 2017-09-17 (×5): 1 via ORAL
  Filled 2017-09-15 (×6): qty 1

## 2017-09-15 MED ORDER — HYDROCORTISONE 1 % EX CREA
TOPICAL_CREAM | CUTANEOUS | Status: DC | PRN
  Administered 2017-09-15: 1 via TOPICAL
  Filled 2017-09-15: qty 28

## 2017-09-15 NOTE — Telephone Encounter (Signed)
Megan [name] of [facility] called today requesting information on if [facility] will be utilized for PT, OT, and ST services for this patient when she is discharged from hospital.

## 2017-09-15 NOTE — Progress Notes (Signed)
Subjective/Complaints:  Had 1 incontinent void.  Postvoid residual 400 mL No increased dizziness after scopolamine patch has been discontinued   ROS-- no abd pain , no N/V, having sm BM daily, no SOB or cough, unable to void Objective: Vital Signs: Blood pressure (!) 142/67, pulse 86, temperature 97.9 F (36.6 C), temperature source Oral, resp. rate 16, height 5\' 8"  (1.727 m), weight 73 kg (160 lb 15 oz), SpO2 100 %. No results found. Results for orders placed or performed during the hospital encounter of 09/07/17 (from the past 72 hour(s))  Glucose, capillary     Status: Abnormal   Collection Time: 09/12/17 11:43 AM  Result Value Ref Range   Glucose-Capillary 146 (H) 65 - 99 mg/dL  Glucose, capillary     Status: Abnormal   Collection Time: 09/12/17  4:37 PM  Result Value Ref Range   Glucose-Capillary 115 (H) 65 - 99 mg/dL  Glucose, capillary     Status: Abnormal   Collection Time: 09/12/17  9:26 PM  Result Value Ref Range   Glucose-Capillary 163 (H) 65 - 99 mg/dL   Comment 1 Notify RN   Occult blood card to lab, stool     Status: None   Collection Time: 09/13/17  5:00 AM  Result Value Ref Range   Fecal Occult Bld NEGATIVE NEGATIVE  Glucose, capillary     Status: Abnormal   Collection Time: 09/13/17  6:29 AM  Result Value Ref Range   Glucose-Capillary 122 (H) 65 - 99 mg/dL   Comment 1 Notify RN   Iron and TIBC     Status: Abnormal   Collection Time: 09/13/17  6:33 AM  Result Value Ref Range   Iron 26 (L) 28 - 170 ug/dL   TIBC 256 250 - 450 ug/dL   Saturation Ratios 10 (L) 10.4 - 31.8 %   UIBC 230 ug/dL  CBC     Status: Abnormal   Collection Time: 09/13/17  6:33 AM  Result Value Ref Range   WBC 4.7 4.0 - 10.5 K/uL   RBC 2.98 (L) 3.87 - 5.11 MIL/uL   Hemoglobin 7.8 (L) 12.0 - 15.0 g/dL   HCT 25.5 (L) 36.0 - 46.0 %   MCV 85.6 78.0 - 100.0 fL   MCH 26.2 26.0 - 34.0 pg   MCHC 30.6 30.0 - 36.0 g/dL   RDW 15.6 (H) 11.5 - 15.5 %   Platelets 329 150 - 400 K/uL   Glucose, capillary     Status: Abnormal   Collection Time: 09/13/17 11:29 AM  Result Value Ref Range   Glucose-Capillary 129 (H) 65 - 99 mg/dL  Glucose, capillary     Status: Abnormal   Collection Time: 09/13/17  4:47 PM  Result Value Ref Range   Glucose-Capillary 120 (H) 65 - 99 mg/dL  Glucose, capillary     Status: Abnormal   Collection Time: 09/13/17  9:05 PM  Result Value Ref Range   Glucose-Capillary 165 (H) 65 - 99 mg/dL  Glucose, capillary     Status: Abnormal   Collection Time: 09/14/17  6:43 AM  Result Value Ref Range   Glucose-Capillary 120 (H) 65 - 99 mg/dL  Glucose, capillary     Status: Abnormal   Collection Time: 09/14/17 11:47 AM  Result Value Ref Range   Glucose-Capillary 127 (H) 65 - 99 mg/dL  Glucose, capillary     Status: Abnormal   Collection Time: 09/14/17  4:29 PM  Result Value Ref Range   Glucose-Capillary 138 (H) 65 -  99 mg/dL  Glucose, capillary     Status: Abnormal   Collection Time: 09/14/17  9:03 PM  Result Value Ref Range   Glucose-Capillary 165 (H) 65 - 99 mg/dL  Glucose, capillary     Status: Abnormal   Collection Time: 09/15/17  6:32 AM  Result Value Ref Range   Glucose-Capillary 135 (H) 65 - 99 mg/dL     HEENT: normal Cardio: RRR and no murmur Resp: CTA B/L and unlabored GI: BS positive and NT, ND Extremity:  No Edema Skin:   Wound C/D/I and thoracolumbar Neuro: Alert and oriented, Abnormal Sensory reduce LT left S1, level of alertness limits testing and Abnormal Motor 5/5 in BUE, 3- B HF, 4- B KE, 3- Bilateral ADF Musc/Skel:  Other neg SLR, no Knee or ankle joint swelling or erythema, no hand wrist or elbow erythema or swelling Gen NAD   Assessment/Plan: 1. Functional deficits secondary to L3 body fracture and malpositioning of S1 screw requiring redo T12-L4 fusion and repostioning of S1 screw which require 3+ hours per day of interdisciplinary therapy in a comprehensive inpatient rehab setting. Physiatrist is providing close team  supervision and 24 hour management of active medical problems listed below. Physiatrist and rehab team continue to assess barriers to discharge/monitor patient progress toward functional and medical goals. FIM: Function - Bathing Position: Bed Body parts bathed by patient: Right arm, Abdomen, Chest, Left arm, Right upper leg, Left upper leg, Front perineal area, Buttocks, Right lower leg Body parts bathed by helper: Left lower leg, Back Bathing not applicable: Front perineal area, Buttocks, Back Assist Level: Touching or steadying assistance(Pt > 75%)  Function- Upper Body Dressing/Undressing What is the patient wearing?: Bra, Pull over shirt/dress, Orthosis Bra - Perfomed by patient: Thread/unthread right bra strap, Thread/unthread left bra strap Bra - Perfomed by helper: Hook/unhook bra (pull down sports bra) Pull over shirt/dress - Perfomed by patient: Thread/unthread right sleeve, Thread/unthread left sleeve, Pull shirt over trunk, Put head through opening Orthosis activity level: Performed by patient Assist Level: Touching or steadying assistance(Pt > 75%) Set up : To obtain clothing/put away Function - Lower Body Dressing/Undressing What is the patient wearing?: Pants, Maryln Manuel, Shoes Position: Bed Underwear - Performed by helper: Thread/unthread right underwear leg, Thread/unthread left underwear leg, Pull underwear up/down Pants- Performed by patient: Pull pants up/down Pants- Performed by helper: Thread/unthread right pants leg, Thread/unthread left pants leg Non-skid slipper socks- Performed by helper: Don/doff right sock, Don/doff left sock Shoes - Performed by patient: Don/doff right shoe, Don/doff left shoe, Fasten right, Fasten left Shoes - Performed by helper: Don/doff right shoe, Don/doff left shoe, Fasten right, Fasten left TED Hose - Performed by helper: Don/doff right TED hose, Don/doff left TED hose Assist for footwear: Maximal assist Assist for lower body dressing:  (ModA) Assistive Device Comment: Reacher + shoe funnel Set up : To obtain clothing/put away  Function - Toileting Toileting activity did not occur: No continent bowel/bladder event Toileting steps completed by patient: Performs perineal hygiene Toileting steps completed by helper: Adjust clothing prior to toileting, Performs perineal hygiene, Adjust clothing after toileting Toileting Assistive Devices: Grab bar or rail Assist level: Touching or steadying assistance (Pt.75%)  Function - Air cabin crew transfer activity did not occur: Safety/medical concerns Toilet transfer assistive device: Drop arm commode Assist level to toilet: Touching or steadying assistance (Pt > 75%) Assist level from toilet: Touching or steadying assistance (Pt > 75%)  Function - Chair/bed transfer Chair/bed transfer method: Lateral scoot Chair/bed transfer assist  level: Supervision or verbal cues Chair/bed transfer assistive device: Armrests Chair/bed transfer details: Verbal cues for precautions/safety, Verbal cues for technique, Verbal cues for sequencing  Function - Locomotion: Wheelchair Will patient use wheelchair at discharge?: Yes Type: Manual Max wheelchair distance: 211ft  Assist Level: No help, No cues, assistive device, takes more than reasonable amount of time Assist Level: No help, No cues, assistive device, takes more than reasonable amount of time Assist Level: No help, No cues, assistive device, takes more than reasonable amount of time Turns around,maneuvers to table,bed, and toilet,negotiates 3% grade,maneuvers on rugs and over doorsills: Yes Function - Locomotion: Ambulation Ambulation activity did not occur: Safety/medical concerns Assistive device: Walker-rolling Max distance: 62ft  Assist level: Touching or steadying assistance (Pt > 75%) Assist level: Touching or steadying assistance (Pt > 75%)  Function - Comprehension Comprehension: Auditory Comprehension assist level:  Follows complex conversation/direction with no assist  Function - Expression Expression: Verbal Expression assist level: Expresses complex ideas: With no assist  Function - Social Interaction Social Interaction assist level: Interacts appropriately with others - No medications needed.  Function - Problem Solving Problem solving assist level: Solves complex problems: Recognizes & self-corrects  Function - Memory Memory assist level: Complete Independence: No helper Patient normally able to recall (first 3 days only): Current season, Location of own room, Staff names and faces, That he or she is in a hospital  Medical Problem List and Plan: 1.  Decreased functional mobility secondary to L3 body fracture with compression and posterior displacement and repositioning of lateral sacral screw extension of lumbar fusion to thoracic T12 09/02/2017 as well as history of multiple thoracic lumbar surgeries. Back brace when out of bed. CIR PT, OT, 2.  DVT Prophylaxis/Anticoagulation: SCDs. Check vascular study 3. Pain Management/chronic back pain: Baclofen 10 mg 3 times a day, MS Contin 30 mg every 12 hours, Neurontin 300 mg 3 times a day, Celebrex 200 mg every 12 hours, Valium for muscle spasms and hydrocodone for breakthrough pain as needed  4. Mood: Provide emotional support 5. Neuropsych: This patient is capable of making decisions on her own behalf. 6. Skin/Wound Care: Routine skin checks 7. Fluids/Electrolytes/Nutrition: Routine I&O's with follow-up chemistries 8. Acute blood loss anemia. Follow-up CBC improved from 6/5 to 8.4 now drifted down to  7.8 after transfusion of 1 Unit 12/30, recheck today 9. Diabetes mellitus peripheral neuropathy. SSI. Hemoglobin A1c 6.0. Check blood sugars before meals and at bedtime. Patient on Glucophage 500 mg twice a day prior to admission. Resume as needed CBG (last 3) controlled Recent Labs    09/14/17 1629 09/14/17 2103 09/15/17 0632  GLUCAP 138* 165*  135*  Controlled 1/3 10. Hypertension. Lisinopril 5 mg daily Vitals:   09/14/17 1434 09/15/17 0300  BP: 129/66 (!) 142/67  Pulse: 68 86  Resp: 18 16  Temp: 98.4 F (36.9 C) 97.9 F (36.6 C)  SpO2: 932% 355%  Systolic elevation 1/1 monitor 11. Neurogenic bladder. Intermittent catheterizations 3 times a day as prior to [place] cath last noc denied increased fluid intake, RN notes cloudy urine.  Check urinalysis will d/c scop patch due to potential for urinary retention   12. Hyperlipidemia. Lipitor  13.  Lethargy- Improved was med related MSO4 and valium     14.  Hypoalb- Prostat 15.  Spasticity RLE adductors Hamstrings and ankle plantar flexors and invertors increase baclofen to QID LOS (Days) 8 A FACE TO FACE EVALUATION WAS PERFORMED  Charlett Blake 09/15/2017, 9:36 AM

## 2017-09-15 NOTE — Patient Care Conference (Signed)
Inpatient RehabilitationTeam Conference and Plan of Care Update Date: 09/14/2017   Time: 11:00 AM    Patient Name: Megan [name]      Medical Record Number: [id number]  Date of Birth: [birth year] Sex: Female         Room/Bed: 4W18C/4W18C-01 Payor Info: Payor: [place] / Plan: BCBS OTHER / Product Type: *No Product type* /    Admitting Diagnosis: Debility redo back surgery  Admit Date/Time:  09/07/2017  7:53 PM Admission Comments: No comment available   Primary Diagnosis:  <principal problem not specified> Principal Problem: <principal problem not specified>  Patient Active Problem List   Diagnosis Date Noted  . Debility   . S/P fusion of thoracic spine   . Anemia of chronic disease   . Chronic pain syndrome   . Hypokalemia   . Acute lower UTI 09/04/2017  . L3 vertebral fracture ([place]) 09/01/2017  . Constipation due to pain medication   . Muscle spasms of both lower extremities   . Type 2 diabetes mellitus with peripheral neuropathy (HCC)   . Hypoalbuminemia due to protein-calorie malnutrition ([place])   . Radiculopathy 07/28/2017  . Fusion of spine, lumbosacral region   . Acute blood loss anemia   . Benign essential HTN   . Diabetes mellitus type 2 in obese ([place])   . Lumbosacral spondylosis with radiculopathy 07/26/2017  . Sensation of pressure in bladder area 01/31/2017  . Paraplegia, incomplete ([place]) 01/07/2017  . Neurogenic bladder 12/27/2016  . 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/27/2016  . Myelopathy ([place]) 12/21/2016  . Surgery, elective   . Diabetes mellitus type 2 in nonobese (HCC)   . History of lumbar fusion   . History of fusion of cervical spine   . Neuropathic pain   . Post-operative pain   . 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 Paraparesis ([place]) 12/15/2016  . Pedal edema 12/14/2016  . Type 2 diabetes mellitus without complication, without long-term current use of insulin ([place]) 11/02/2016  . Hyperlipidemia 11/02/2016  . Essential  hypertension 11/02/2016  . 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    Expected Discharge Date: Expected Discharge Date: 09/17/17  Team Members Present: Physician leading conference: Dr. Alysia Penna Social Worker Present: Ovidio Kin, LCSW Nurse Present: Frances Maywood, RN PT Present: Barrie Folk, PT OT Present: Cherylynn Ridges, OT SLP Present: Charolett Bumpers, SLP PPS Coordinator present : Daiva Nakayama, RN, CRRN     Current Status/Progress Goal Weekly Team Focus  Medical   Chronic RLE spasticity , Neurogenic bowel and bladder  reduce incont episodes between caths  manage neurogenic bowel and bladder   Bowel/Bladder   patient requires in/out cath, scheduled Q 8 hrs/ LBM 09/13/17  patient gain the ability to empty her bladder  continue bladder program   Swallow/Nutrition/ Hydration             ADL's   with AE pt can don LB clothing with min A/ S, S with squat pivot  Min A/supervision  ADL training, pt/family education, general strengthening   Mobility   supervision for supine>sit and lateral scoot transfers, min/mod for sit<>stand and gait in // bars   supervision w/c level, gait with PT only for NMR  standing activities for weight bearing, LE strengthening   Communication             Safety/Cognition/ Behavioral Observations            Pain   c/o pain to  her back & BLE, scale 7, has norco, neurontin, celebrex & baclofen  pain scale <5  continue to assess & treat as needed   Skin   incision down the midline to back, covered with a telfa drsg  no new areas of skin break down  assess q shift    Rehab Goals Patient on target to meet rehab goals: Yes Rehab Goals Revised: none *See Care Plan and progress notes for long and short-term goals.     Barriers to Discharge  Current Status/Progress Possible Resolutions Date Resolved   Physician    Incontinence;Neurogenic Bowel & Bladder  hx of paraplegia  slow  progress  Cont rehab, rehab RN to work on bowel adn bladder program      Nursing                  PT                    OT                  SLP                SW                Discharge Planning/Teaching Needs:  Patient's daughter, husband, neighbor, and friend to assist pt at home at min A level.  Education has been completed with family at pt's previous CIR admission but will be ongoing, as needed.   Team Discussion:  Pt's goals were able to be upgraded to supervision to mod I w/c level.  Pt ambulated 30' min A with RW.  Pt contains to retain urine and needs I&O caths.  Pt is doing daily bowel program and is making progress.  Pt will need to continue bladder and bowel program at home.  Revisions to Treatment Plan:  none    Continued Need for Acute Rehabilitation Level of Care: The patient requires daily medical management by a physician with specialized training in physical medicine and rehabilitation for the following conditions: Daily direction of a multidisciplinary physical rehabilitation program to ensure safe treatment while eliciting the highest outcome that is of practical value to the patient.: Yes Daily medical management of patient stability for increased activity during participation in an intensive rehabilitation regime.: Yes Daily analysis of laboratory values and/or radiology reports with any subsequent need for medication adjustment of medical intervention for : Neurological problems;Urological problems;Wound care problems  Megan [name], Silvestre Mesi 09/15/2017, 11:15 AM

## 2017-09-15 NOTE — Progress Notes (Signed)
Physical Therapy Session Note  Patient Details  Name: ALIVIYAH MALANGA MRN: [id number] Date of Birth: [birth year]  Today's Date: 09/15/2017 PT Individual Time:1100-1200 AND 1530-1630  60 and 60   Short Term Goals: Week 1:  PT Short Term Goal 1 (Week 1): Pt will transfer with min A from bed to chair. PT Short Term Goal 2 (Week 1): Standing and ambulation to be assessed.  Skilled Therapeutic Interventions/Progress Updates:   Pt received sitting in WC and agreeable to PT. CSW present to discuss D/c planning and DME needs.   WC mobility x 247f without assist from PT.   Gait training instructed by PT with RW x45fwith min assist. Min cues for improved posture, gait pattern and terminal knee extension on the RLE to prevent knee collapse.   Car transfer training instructed by PT with RW and min assist.  Min cues for AD management and technique.   Stand pivot transfers with RW x 3 throughout treatment with min assist and min cues for proper UE placement. Squat pivot transfers with supervision assist x 2  Nustep endurance training x 8 minutes with min cues for improved control and positioning of the R hip/knee. Pt states that she requires the use of the RUE to maintain neutral hip alignment.   [facility] returned to [facility] and [facility] in reach and all needs met.     Session 2.   Pt received sitting in WC and agreeable to PT. WC mobility through unit without assist from PT.   Lateral scoot to mat table with supervision assist from PT with min cues for WC parts management. Sit>supine with min assist from PT to control the RLE.   Supine NMR.  SAQ 2 x 12  Heel slides. 2 x 10   SLR. x10 BLE with AAROM Clam shells 2x 12 AAROM Ankle pumps. 2x 20  Hip abduction x 10  Bridges. X 10 with 3 sec hold.    Pt instructed pt in gait training in room x 2039fith min assist and min cues for gait pattern and AD management.   Patient returned to room and [facility]  call [facility] in reach and all needs met.            Therapy Documentation Precautions:  Precautions Precautions: Fall, Back Precaution Booklet Issued: No Required Braces or Orthoses: Spinal Brace Spinal Brace: Thoracolumbosacral orthotic, Applied in sitting position Other Brace/Splint: AFO RLE (not present during eval) Restrictions Weight Bearing Restrictions: No Pain: 0/10 at rest.   See Function Navigator for Current Functional Status.   Therapy/Group: Individual Therapy  AusLorie Phenix3/2019, 11:31 AM

## 2017-09-15 NOTE — Progress Notes (Addendum)
Occupational Therapy Session Note  Patient Details  Name: Megan [name] MRN: [id number] Date of Birth: 08/18/1951  Today's Date: 09/15/2017 OT Individual Time: 0800-0910 OT Individual Time Calculation (min): 70 min    Short Term Goals: Week 1:  OT Short Term Goal 1 (Week 1): Pt will sit to stand with MOD A of 1 wiht RW to decrease burden of care OT Short Term Goal 2 (Week 1): Pt will stand pivot transfer to [facility] with MAX A 1 caregiver OT Short Term Goal 3 (Week 1): Pt will don footwear with AE PRN OT Short Term Goal 4 (Week 1): Pt will bathe LB wiht MIN A PRN  Skilled Therapeutic Interventions/Progress Updates:    Pt presents supine in bed with no c/o pain, agreeable to OT tx session. Pt completes bathing and LB dressing from bed level, rolling to L/R with supervision with assist to wash lower portion of LLE and back. Total assist to don TEDs, ace wrap, and to thread bil LEs into pantlegs with Pt advancing over hips rolling to L/R. Pt completes bed mobility with MinA to bring trunk upright, completing UB dressing seated EOB with setup assist. Completes lateral scoot transfer EOB to w/c with S. Pt propels w/c to sink for seated grooming ADLs. During ADL completion discussion held regarding d/c planning with Pt reporting feeling comfortable with bathroom setup at home from previous CIR stay and d/c home. Pt left seated in w/c end of session, call bell and needs within reach, daughter present.    Therapy Documentation Precautions:  Precautions Precautions: Fall, Back Precaution Booklet Issued: No Required Braces or Orthoses: Spinal Brace Spinal Brace: Thoracolumbosacral orthotic, Applied in sitting position Other Brace/Splint: AFO RLE (not present during eval) Restrictions Weight Bearing Restrictions: No    ADL: ADL ADL Comments: see functional navigator  See Function Navigator for Current Functional Status.   Therapy/Group: Individual Therapy  Raymondo Band 09/15/2017, 9:24  AM

## 2017-09-15 NOTE — Progress Notes (Signed)
Social Work Patient ID: Megan [name], female   DOB: 08-28-1951, 67 y.o.   MRN: [id number]   CSW met with pt and her dtr to update them on team conference discussion and targeted d/c date of 09-17-17.  Pt felt good about that date, but is concerned that she does not have a w/c at home that is easier for her to tx in and out of the car for appts and in her home.  CSW called [facility] w/c rep, Romilda Garret, and he is working on finding her a Electronics engineer.  Also called [facility] to alert them of pt's d/c and to inquire about pt getting more comfortable caths for home use.  CSW will continue to follow and assist as needed.

## 2017-09-15 NOTE — Telephone Encounter (Signed)
Pt is in hospital , Ovidio Kin is SW

## 2017-09-16 ENCOUNTER — Inpatient Hospital Stay (HOSPITAL_COMMUNITY): Payer: BLUE CROSS/BLUE SHIELD | Admitting: Occupational Therapy

## 2017-09-16 ENCOUNTER — Other Ambulatory Visit: Payer: Self-pay

## 2017-09-16 ENCOUNTER — Telehealth: Payer: Self-pay

## 2017-09-16 ENCOUNTER — Inpatient Hospital Stay (HOSPITAL_COMMUNITY): Payer: BLUE CROSS/BLUE SHIELD | Admitting: Physical Therapy

## 2017-09-16 LAB — GLUCOSE, CAPILLARY
GLUCOSE-CAPILLARY: 142 mg/dL — AB (ref 65–99)
GLUCOSE-CAPILLARY: 174 mg/dL — AB (ref 65–99)
Glucose-Capillary: 140 mg/dL — ABNORMAL HIGH (ref 65–99)
Glucose-Capillary: 136 mg/dL — ABNORMAL HIGH (ref 65–99)

## 2017-09-16 MED ORDER — DOCUSATE SODIUM 100 MG PO CAPS: 100.0000 mg | ORAL_CAPSULE | Freq: Two times a day (BID) | ORAL | 0 refills | Status: DC

## 2017-09-16 MED ORDER — FERROUS SULFATE 325 (65 FE) MG PO TABS: 325.0000 mg | ORAL_TABLET | Freq: Two times a day (BID) | ORAL | 3 refills | Status: DC

## 2017-09-16 MED ORDER — LISINOPRIL 5 MG PO TABS: 5.0000 mg | ORAL_TABLET | Freq: Every day | ORAL | 0 refills | Status: DC

## 2017-09-16 MED ORDER — SULFAMETHOXAZOLE-TRIMETHOPRIM 800-160 MG PO TABS: 1.0000 | ORAL_TABLET | Freq: Two times a day (BID) | ORAL | 0 refills | Status: DC

## 2017-09-16 MED ORDER — ESOMEPRAZOLE MAGNESIUM 20 MG PO CPDR: 20.0000 mg | DELAYED_RELEASE_CAPSULE | Freq: Every day | ORAL | 0 refills | Status: DC

## 2017-09-16 MED ORDER — ATORVASTATIN CALCIUM 20 MG PO TABS: 20.0000 mg | ORAL_TABLET | Freq: Every evening | ORAL | 3 refills | Status: DC

## 2017-09-16 MED ORDER — METFORMIN HCL 500 MG PO TABS: 500.0000 mg | ORAL_TABLET | Freq: Two times a day (BID) | ORAL | 2 refills | Status: DC

## 2017-09-16 MED ORDER — TAMSULOSIN HCL 0.4 MG PO CAPS: 0.4000 mg | ORAL_CAPSULE | Freq: Every day | ORAL | 0 refills | Status: DC

## 2017-09-16 MED ORDER — HYDROCODONE-ACETAMINOPHEN 7.5-325 MG PO TABS: 1.0000 | ORAL_TABLET | ORAL | 0 refills | Status: DC | PRN

## 2017-09-16 MED ORDER — BACLOFEN 10 MG PO TABS: 10.0000 mg | ORAL_TABLET | Freq: Four times a day (QID) | ORAL | 0 refills | Status: DC

## 2017-09-16 MED ORDER — CELECOXIB 200 MG PO CAPS: 200.0000 mg | ORAL_CAPSULE | Freq: Two times a day (BID) | ORAL | 0 refills | Status: DC

## 2017-09-16 MED ORDER — GABAPENTIN 300 MG PO CAPS: 300.0000 mg | ORAL_CAPSULE | Freq: Three times a day (TID) | ORAL | 2 refills | Status: DC

## 2017-09-16 NOTE — Progress Notes (Signed)
Physical Therapy Discharge Summary  Patient Details  Name: Megan [name] MRN: [id number] Date of Birth: 1950-11-24  Today's Date: 09/16/2017 PT Individual Time: 1115-1200 and 1500-1559 PT Individual Time Calculation (min): 45 min and 59 min    Patient has met 8 of 8 long term goals due to improved activity tolerance, improved balance and increased strength.  Patient to discharge at a wheelchair level Supervision.   Patient's care partner is independent to provide the necessary physical assistance at discharge.   Recommendation:  Patient will benefit from ongoing skilled PT services in home health setting to continue to advance safe functional mobility, address ongoing impairments in strength, balance, and activity tolerance, and minimize fall risk.  Equipment: loaner K5 w/c provided by [facility], to be replaced by pt's K5 when available  Reasons for discharge: treatment goals met  Patient/family agrees with progress made and goals achieved: Yes   Skilled PT Intervention: Session 1: No c/o pain.  Session focus on activity tolerance and functional mobility.  Pt propels w/c mod I throughout unit.  SIt<>stand from w/c with supervision and RW.  Gait x53' with RW and min guard/close supervision with verbal cues for upright posture and walker positioning.  Step ups to 3" step for LLE strengthening and balance x4 reps with BUE support, RLE buckled on final descent.  Pt positioned in loander K5 w/c by Luz Brazen.  Returned to room at end of session and positioned upright in w/c with call bell in reach and needs met.   Session 2:  No c/o pain, but does report discomfort with TLSO, despite adjustments.  Session focus on d/c assessment and pt education for d/c planning and f/u.  Pt currently requiring supervision for lateral scoot transfers and stand/pivot transfers with RW from elevated surface.  Pt continues to be self limiting, for example, she lifted her LEs off the floor completely (hips  flexed/knees extended) to maneuver her w/c towards the car, but when asked to perform simple hip flexion or knee extension for strength testing she did not even demonstrate trace muscle activation.  PT provided encouragement to maximize mobility throughout session and at d/c.  Pt returned to room at end of session and positioned upright in w/c with call bell in reach and needs met.   PT Discharge Precautions/Restrictions Precautions Precautions: Fall;Back Precaution Comments: pt able to verbalize 3/3 back precautions Required Braces or Orthoses: Spinal Brace Spinal Brace: Applied in sitting position Other Brace/Splint: AFO RLE Restrictions Weight Bearing Restrictions: No Vital Signs BP 133/66 Pain Pain Assessment Pain Assessment: Faces Faces Pain Scale: Hurts a little bit Pain Type: Surgical pain Pain Location: Back Pain Orientation: Lower Vision/Perception  Perception Perception: Within Functional Limits Praxis Praxis: Intact  Cognition Overall Cognitive Status: Difficult to assess(pt having difficulty with timeline and keeping track of conversations with Neurosurgeon and rehab MD) Arousal/Alertness: Awake/alert Orientation Level: Oriented X4 Attention: Selective Sustained Attention: Appears intact Selective Attention: Appears intact Memory: Appears intact Awareness: Appears intact Problem Solving: Appears intact Safety/Judgment: Appears intact Sensation Sensation Light Touch: Impaired Detail(absent LT to proximal/medial RLE) Light Touch Impaired Details: Absent RLE Proprioception: Impaired Detail Proprioception Impaired Details: Impaired LLE;Impaired RLE Additional Comments: pt with deep touch sensation in RLE Coordination Gross Motor Movements are Fluid and Coordinated: No Fine Motor Movements are Fluid and Coordinated: No Coordination and Movement Description: parapresis RLE>LLE Motor  Motor Motor: Paraplegia Motor - Discharge Observations: paraparesis RLE>LLE   Mobility Bed Mobility Bed Mobility: Supine to Sit;Sit to Supine Rolling Right: 5: Supervision Supine  to Sit: HOB elevated;With rails;6: Modified independent (Device/Increase time) Sit to Supine: 6: Modified independent (Device/Increase time) Transfers Transfers: Yes Sit to Stand: 5: Supervision Sit to Stand Details: Verbal cues for sequencing;Verbal cues for technique Stand to Sit: 5: Supervision Squat Pivot Transfers: 5: Supervision Lateral/Scoot Transfers: 5: Supervision Locomotion  Ambulation Ambulation: Yes Ambulation/Gait Assistance: 4: Min guard Ambulation Distance (Feet): 53 Feet Assistive device: Rolling walker Gait Gait: Yes Stairs / Additional Locomotion Stairs: Yes Stairs Assistance: 4: Min assist Stair Management Technique: Two rails Number of Stairs: 4(4x1 step) Height of Stairs: 3 Ramp: 6: Modified independent (Device)(w/c level) Product manager Mobility: Yes Wheelchair Assistance: 6: Modified independent (Device/Increase time) Environmental health practitioner: Both upper extremities Wheelchair Parts Management: Supervision/cueing Distance: 300  Trunk/Postural Assessment  Cervical Assessment Cervical Assessment: (forward head) Thoracic Assessment Thoracic Assessment: (rounded shoulders) Lumbar Assessment Lumbar Assessment: Within Functional Limits Postural Control Postural Control: Deficits on evaluation Postural Limitations: decreased  Balance Balance Balance Assessed: Yes Static Sitting Balance Static Sitting - Balance Support: Feet supported Static Sitting - Level of Assistance: 6: Modified independent (Device/Increase time) Dynamic Sitting Balance Dynamic Sitting - Balance Support: During functional activity Dynamic Sitting - Level of Assistance: 5: Stand by assistance Dynamic Standing Balance Dynamic Standing - Balance Support: Left upper extremity supported;Right upper extremity supported;During functional activity Dynamic Standing -  Level of Assistance: 5: Stand by assistance Extremity Assessment  See details from Session 2 therapy note above.   See Function Navigator for Current Functional Status.  Michel Santee 09/16/2017, 3:51 PM

## 2017-09-16 NOTE — Telephone Encounter (Signed)
Lavella Lemons from [facility] called stating that patient went into [facility] on 09/07/17. She stated that patient will be discharged and home tomorrow and was wondering if it was alright to resume services.

## 2017-09-16 NOTE — Discharge Instructions (Signed)
Inpatient Rehab Discharge Instructions  [facility] Discharge date and time: No discharge date for patient encounter.   Activities/Precautions/ Functional Status: Activity: Back brace when out of bed Diet: diabetic diet Wound Care: keep wound clean and dry Functional status:  ___ No restrictions     ___ Walk up steps independently ___ 24/7 supervision/assistance   ___ Walk up steps with assistance ___ Intermittent supervision/assistance  ___ Bathe/dress independently ___ Walk with walker     _x__ Bathe/dress with assistance ___ Walk Independently    ___ Shower independently ___ Walk with assistance    ___ Shower with assistance ___ No alcohol     ___ Return to work/school ________  COMMUNITY REFERRALS UPON DISCHARGE:   Home Health:   PT     OT     RN  Agency:  [facility] Phone:  [phone] Medical Equipment/Items Ordered:  Felton Clinton from [facility] to bring you a loaner wheelchair to use until your wheelchair comes in.  You have all other recommended equipment.  Agency/Supplier:  [facility]        Phone:  [phone]  Special Instructions: Continue intermittent catheterization 3 times daily as prior to admission   My questions have been answered and I understand these instructions. I will adhere to these goals and the provided educational materials after my discharge from the hospital.  Patient/Caregiver Signature _______________________________ Date __________  Clinician Signature _______________________________________ Date __________  Please bring this form and your medication list with you to all your follow-up doctor's appointments.

## 2017-09-16 NOTE — Discharge Summary (Signed)
Discharge summary job [phone]

## 2017-09-16 NOTE — Telephone Encounter (Signed)
[facility] notified.

## 2017-09-16 NOTE — Telephone Encounter (Signed)
Ok to resume Home health

## 2017-09-16 NOTE — Progress Notes (Signed)
Occupational Therapy Discharge Summary  Patient Details  Name: Megan [name] MRN: [id number] Date of Birth: August 23, 1951  Today's Date: 09/16/2017 OT Individual Time: 9563-8756 OT Individual Time Calculation (min): 89 min   OT treatments session focused on increased independence with BADL tasks, dc planning, and functional ambulation. Bed level and EOB level bathing/dressing completed with overall set-up/min A for LB dressing- please see functional navigator for further details. Pt then propelled wc to therapy gym and worked on functional ambulation.  Sit<>stand from wc with RW and close supervision, then ambulated ~25 feet with RW and min guard A. Pt returned to room and left seated in wc with needs met.    Patient has met 9 of 9 long term goals due to improved activity tolerance, improved balance, postural control, ability to compensate for deficits and functional use of  RIGHT lower and LEFT lower extremity.  Patient to discharge at [facility] Assist level.  Patient's care partner is independent to provide the necessary physical assistance at discharge.    Reasons goals not met: n/a  Recommendation:  Patient will benefit from ongoing skilled OT services in home health setting to continue to advance functional skills in the area of BADL and iADL.  Equipment: wheelchair (see PT note for details)  Reasons for discharge: treatment goals met and discharge from hospital  Patient/family agrees with progress made and goals achieved: Yes  OT Discharge Precautions/Restrictions  Precautions Precautions: Fall;Back Spinal Brace: Thoracolumbosacral orthotic;Applied in sitting position Other Brace/Splint: AFO RLE Restrictions Weight Bearing Restrictions: No Pain Pain Assessment Pain Score: 4  ADL ADL Equipment Provided: Reacher, Sock aid, Long-handled sponge, Leg lifter Eating: Independent Grooming: Independent Upper Body Bathing: Setup, Supervision/safety Lower Body  Bathing: Supervision/safety, Setup Upper Body Dressing: Supervision/safety, Setup Lower Body Dressing: Supervision/safety, Setup Toileting: Minimal assistance Toilet Transfer: Close supervision Tub/Shower Transfer: Close supervison ADL Comments: see functional navigator Perception  Perception: Within Functional Limits Praxis Praxis: Intact Cognition Overall Cognitive Status: Within Functional Limits for tasks assessed Arousal/Alertness: Awake/alert Orientation Level: Oriented X4 Attention: Sustained Sustained Attention: Appears intact Memory: Appears intact Awareness: Appears intact Problem Solving: Appears intact Safety/Judgment: Appears intact Sensation Sensation Light Touch: Impaired Detail(Absent to light touch in R LE) Light Touch Impaired Details: Absent RLE Coordination Fine Motor Movements are Fluid and Coordinated: Yes Motor  Motor Motor: Paraplegia Motor - Discharge Observations: paraparesis RLE>LLE Mobility  Transfers Sit to Stand: 5: Supervision Sit to Stand Details: Verbal cues for sequencing;Verbal cues for technique Stand to Sit: 5: Supervision  Balance Static Sitting Balance Static Sitting - Balance Support: Feet supported Static Sitting - Level of Assistance: 6: Modified independent (Device/Increase time) Dynamic Sitting Balance Dynamic Sitting - Balance Support: During functional activity Dynamic Sitting - Level of Assistance: 5: Stand by assistance Dynamic Standing Balance Dynamic Standing - Level of Assistance: 5: Stand by assistance Extremity/Trunk Assessment RUE Assessment RUE Assessment: Within Functional Limits LUE Assessment LUE Assessment: Within Functional Limits   See Function Navigator for Current Functional Status.  Daneen Schick Yandell Mcjunkins 09/16/2017, 12:56 PM

## 2017-09-16 NOTE — Discharge Summary (Signed)
Megan [name], Megan [name]                ACCOUNT NO.:  [id number]  MEDICAL RECORD NO.:  [id number]  LOCATION:                                 FACILITY:  PHYSICIAN:  Charlett Blake, M.D.DATE OF BIRTH:  1951-08-25  DATE OF ADMISSION:  09/07/2017 DATE OF DISCHARGE:  09/17/2017                              DISCHARGE SUMMARY   DISCHARGE DIAGNOSES: 1. L3 body fracture with compression and posterior displacement and     repositioning of lateral sacral screw fixation of lumbar fusion to     thoracic T12, September 02, 2017, as well as history of multiple     thoracic lumbar surgeries with myelopathy. 2. SCDs for DVT prophylaxis. 3. Pain management. 4. Acute blood loss anemia. 5. Diabetes mellitus. 6. Peripheral neuropathy. 7. Neurogenic bladder. 8. Spasticity, right lower extremity adductors.  HISTORY OF PRESENT ILLNESS:  This is a 67 year old right-handed female with history of diabetes mellitus, chronic back pain, multiple thoracic lumbar surgeries, neurogenic bladder with catheterization 3 times daily, and received Inpatient Rehab Services, November of 2018, after multilevel lumbar laminectomy and lateral fixation.  She was discharged to home with spouse supervision for wheelchair to mat transfers.  She had been standing in the standing frame.  Presented on August 29, 2017, with nausea, vomiting, abdominal pain, frequent bowel movements. CT abdomen and pelvis showed distended rectum with stool.  Also, noted L3 body fracture with compression and posterior displacement.  Bowel program was regulated.  Neurosurgery consulted.  Underwent lumbar 3-4 redo laminectomy, repositioning of lateral sacral screw, extension of lumbar fusion to thoracic T12, September 02, 2017, per Dr. Cyndy Freeze. Hospital course, pain management, a wound VAC, and Hemovac had been in place.  Lumbar back corset when out of bed.  Treated for an E. coli UTI. Acute on chronic anemia 7 and monitored.  Physical and  occupational therapy ongoing.  The patient was admitted for a comprehensive rehab program.  PAST MEDICAL HISTORY:  See discharge diagnoses.  SOCIAL HISTORY:  Lives with spouse.  Primarily used wheelchair prior to admission.  Functional status upon admission to [facility] was moderate assist, 2-feet rolling walker, +2 physical assist sit to stand, mod-to-max assist activities of daily living.  PHYSICAL EXAMINATION:  VITAL SIGNS:  Blood pressure 128/61, pulse 90, temperature 97, respirations 16. GENERAL:  Alert female, oriented x3. HEENT:  EOMs intact. NECK:  Supple, nontender.  No JVD. CARDIAC:  Rate controlled. ABDOMEN:  Soft, nontender.  Good bowel sounds. LUNGS:  Clear to auscultation without wheeze. MUSCULOSKELETAL:  Motor strength bilateral upper 4+/5 proximal to distal.  Left lower extremity, hip flexors 3-/4, knee extension 4-/5, ankle dorsi and plantar flexion 4/5.  Right lower extremity hip flexors 2-/5, ankle dorsiflexion 4-/5.  REHABILITATION HOSPITAL COURSE:  The patient was admitted to Inpatient Rehab Services with therapies initiated on a 3-hour daily basis consisting of physical therapy, occupational therapy, and rehabilitation nursing.  The following issues were addressed during the patient's rehabilitation stay pertaining to Ms. Kalman's L3 body fracture compression, posterior displacement, repositioning of lateral screw extension.  Surgical site healing nicely.  Wound VAC had been discontinued as well as Hemovac.  She would follow up with Neurosurgery, Dr. Cyndy Freeze.  SCDs for DVT prophylaxis.  Venous Doppler studies negative. Pain management with the use of Celebrex, baclofen, Neurontin, and hydrocodone for breakthrough pain.  She had been on MS Contin, discontinued due to increased lethargy.  Blood pressures overall controlled on low-dose lisinopril.  Acute on chronic anemia.  She had been transfused, continued iron supplement, monitoring of bowel  program related to constipation secondary to iron supplement.  Diabetes mellitus, peripheral neuropathy.  Hemoglobin A1c of 6.  She had been on Glucophage prior to admission.  Neurogenic bladder.  Intermittent catheterization 3 times a day prior to admission.  She was completing a course of Bactrim for UTI.  Spasticity to right lower extremity adductors.  She remained on baclofen 4 times daily.  The patient received weekly collaborative interdisciplinary team conferences to discuss estimated length of stay, family teaching, any barriers to her discharge.  She propels her wheelchair to the rehab gym without cues. Instructed sit to stand, parallel bars, contact guard assist.  Ambulates in parallel bars x5 feet forward and backwards.  The patient instructed sit to stand from wheelchair, rolling walker minimal assist.  She could increase her gait up to 30 feet with minimal assistance.  Activities of daily living and homemaking.  Focused on energy conservation.  Improve sit to stand, functional ambulation.  She could wash her hair with setup.  Completed for sit to stand, ambulate 3 feet forward and backward, and then took her seat for rest breaks.  She needed some assistance for her back brace.  Full family teaching was completed and plan discharge to home.  DISCHARGE MEDICATIONS:  Included: 1. Lipitor 20 mg p.o. daily. 2. Baclofen 10 mg p.o. q.i.d. 3. Celebrex 200 mg p.o. every 12 hours. 4. Colace 100 mg p.o. b.i.d. 5. Pepcid 10 mg p.o. at bedtime. 6. Ferrous sulfate 325 mg p.o. b.i.d. 7. Neurontin 300 mg p.o. t.i.d. 8. Lisinopril 5 mg p.o. daily. 9. Protonix 40 mg p.o. daily. 10.Bactrim 1 tablet every 12 hours, complete 5-day course. 11.Flomax 0.4 mg daily. 12.Hydrocodone 1-2 tablets every 4 hours as needed for pain. 13.Urecholine 5 mg p.o. t.i.d. 14.Glucophage 500 mg p.o. b.i.d.  DIET:  Diabetic diet.  SPECIAL INSTRUCTIONS:  Continue intermittent catheterizations every 3 hours  as prior to admission.  Back brace when out of bed.  FOLLOWUP:  The patient to follow up Dr. Alysia Penna at the [facility] as directed; Dr. Cyndy Freeze, Neurosurgery, call for appointment; Alma Friendly, nurse practitioner with [facility]     Lauraine Rinne, P.A.   ______________________________ Charlett Blake, M.D.    DA/MEDQ  D:  09/16/2017  T:  09/16/2017  Job:  [id number]  cc:   Dr. Marland Kitchen Ditty Alma Friendly, MD Charlett Blake, M.D.

## 2017-09-17 LAB — URINE CULTURE: Culture: >=100000 — AB

## 2017-09-17 LAB — GLUCOSE, CAPILLARY: Glucose-Capillary: 116 mg/dL — ABNORMAL HIGH (ref 65–99)

## 2017-09-17 NOTE — Progress Notes (Signed)
PA Megan [name] reviewed discharge instructions with pt and husband yesterday. Pt and husband saw Dr. Ella Bodo this morning. Pt and wife have no further questions or concerns. Pt escorted to ground floor for discharge in wheelchair by this RN.

## 2017-09-17 NOTE — Progress Notes (Signed)
Subjective/Complaints:  Pt has neurogenic bladder and requires TID caths on a permanent basis   ROS-- no abd pain , no N/V, having sm BM daily, no SOB or cough, unable to void Objective: Vital Signs: Blood pressure 140/73, pulse 80, temperature 98.3 F (36.8 C), temperature source Oral, resp. rate 20, height 5\' 8"  (1.727 m), weight 73 kg (160 lb 15 oz), SpO2 100 %. No results found. Results for orders placed or performed during the hospital encounter of 09/07/17 (from the past 72 hour(s))  Glucose, capillary     Status: Abnormal   Collection Time: 09/14/17 11:47 AM  Result Value Ref Range   Glucose-Capillary 127 (H) 65 - 99 mg/dL  Glucose, capillary     Status: Abnormal   Collection Time: 09/14/17  4:29 PM  Result Value Ref Range   Glucose-Capillary 138 (H) 65 - 99 mg/dL  Glucose, capillary     Status: Abnormal   Collection Time: 09/14/17  9:03 PM  Result Value Ref Range   Glucose-Capillary 165 (H) 65 - 99 mg/dL  Glucose, capillary     Status: Abnormal   Collection Time: 09/15/17  6:32 AM  Result Value Ref Range   Glucose-Capillary 135 (H) 65 - 99 mg/dL  Urine Culture     Status: Abnormal (Preliminary result)   Collection Time: 09/15/17 10:23 AM  Result Value Ref Range   Specimen Description URINE, CATHETERIZED    Special Requests NONE    Culture (A)     >=100,000 COLONIES/mL CITROBACTER FREUNDII SUSCEPTIBILITIES TO FOLLOW    Report Status PENDING   Urinalysis, Routine w reflex microscopic     Status: Abnormal   Collection Time: 09/15/17 10:28 AM  Result Value Ref Range   Color, Urine YELLOW YELLOW   APPearance HAZY (A) CLEAR   Specific Gravity, Urine 1.010 1.005 - 1.030   pH 6.0 5.0 - 8.0   Glucose, UA NEGATIVE NEGATIVE mg/dL   Hgb urine dipstick SMALL (A) NEGATIVE   Bilirubin Urine NEGATIVE NEGATIVE   Ketones, ur NEGATIVE NEGATIVE mg/dL   Protein, ur NEGATIVE NEGATIVE mg/dL   Nitrite POSITIVE (A) NEGATIVE   Leukocytes, UA TRACE (A) NEGATIVE   RBC / HPF 0-5 0  - 5 RBC/hpf   WBC, UA 0-5 0 - 5 WBC/hpf   Bacteria, UA MANY (A) NONE SEEN   Squamous Epithelial / LPF 0-5 (A) NONE SEEN  CBC     Status: Abnormal   Collection Time: 09/15/17 11:11 AM  Result Value Ref Range   WBC 5.5 4.0 - 10.5 K/uL   RBC 3.46 (L) 3.87 - 5.11 MIL/uL   Hemoglobin 9.1 (L) 12.0 - 15.0 g/dL   HCT 29.8 (L) 36.0 - 46.0 %   MCV 86.1 78.0 - 100.0 fL   MCH 26.3 26.0 - 34.0 pg   MCHC 30.5 30.0 - 36.0 g/dL   RDW 15.8 (H) 11.5 - 15.5 %   Platelets 361 150 - 400 K/uL  Glucose, capillary     Status: Abnormal   Collection Time: 09/15/17 11:18 AM  Result Value Ref Range   Glucose-Capillary 147 (H) 65 - 99 mg/dL  Glucose, capillary     Status: Abnormal   Collection Time: 09/15/17  4:37 PM  Result Value Ref Range   Glucose-Capillary 161 (H) 65 - 99 mg/dL  Occult blood card to lab, stool     Status: None   Collection Time: 09/15/17  7:02 PM  Result Value Ref Range   Fecal Occult Bld NEGATIVE NEGATIVE  Glucose,  capillary     Status: Abnormal   Collection Time: 09/15/17  9:09 PM  Result Value Ref Range   Glucose-Capillary 157 (H) 65 - 99 mg/dL  Glucose, capillary     Status: Abnormal   Collection Time: 09/16/17  6:37 AM  Result Value Ref Range   Glucose-Capillary 140 (H) 65 - 99 mg/dL  Glucose, capillary     Status: Abnormal   Collection Time: 09/16/17 11:18 AM  Result Value Ref Range   Glucose-Capillary 142 (H) 65 - 99 mg/dL  Glucose, capillary     Status: Abnormal   Collection Time: 09/16/17  4:28 PM  Result Value Ref Range   Glucose-Capillary 136 (H) 65 - 99 mg/dL  Glucose, capillary     Status: Abnormal   Collection Time: 09/16/17  9:08 PM  Result Value Ref Range   Glucose-Capillary 174 (H) 65 - 99 mg/dL  Glucose, capillary     Status: Abnormal   Collection Time: 09/17/17  6:27 AM  Result Value Ref Range   Glucose-Capillary 116 (H) 65 - 99 mg/dL     HEENT: normal Cardio: RRR and no murmur Resp: CTA B/L and unlabored GI: BS positive and NT, ND Extremity:  No  Edema Skin:   Wound C/D/I and thoracolumbar Neuro: Alert and oriented, Abnormal Sensory reduce LT left S1, level of alertness limits testing and Abnormal Motor 5/5 in BUE, 3- B HF, 4- B KE, 3- Bilateral ADF Musc/Skel:  Other neg SLR, no Knee or ankle joint swelling or erythema, no hand wrist or elbow erythema or swelling Gen NAD   Assessment/Plan: 1. Functional deficits secondary to L3 body fracture and malpositioning of S1 screw requiring redo T12-L4 fusion and repostioning of S1 screw  Stable for D/C today F/u PCP in 3-4 weeks F/u PM&R 2 weeks See D/C summary See D/C instructions FIM: Function - Bathing Position: Bed Body parts bathed by patient: Right arm, Abdomen, Chest, Left arm, Right upper leg, Left upper leg, Front perineal area, Buttocks, Right lower leg, Left lower leg Body parts bathed by helper: Left lower leg, Back Bathing not applicable: Front perineal area, Buttocks, Back Assist Level: Set up, Assistive device Assistive Device Comment: LH sponge  Function- Upper Body Dressing/Undressing What is the patient wearing?: Bra, Pull over shirt/dress, Orthosis Bra - Perfomed by patient: Thread/unthread right bra strap, Hook/unhook bra (pull down sports bra), Thread/unthread left bra strap Bra - Perfomed by helper: Hook/unhook bra (pull down sports bra) Pull over shirt/dress - Perfomed by patient: Thread/unthread right sleeve, Thread/unthread left sleeve, Put head through opening, Pull shirt over trunk Orthosis activity level: Performed by patient Assist Level: Touching or steadying assistance(Pt > 75%) Set up : To obtain clothing/put away Function - Lower Body Dressing/Undressing What is the patient wearing?: Pants, Ted Hose, Shoes, AFO Position: Bed Underwear - Performed by helper: Thread/unthread right underwear leg, Thread/unthread left underwear leg, Pull underwear up/down Pants- Performed by patient: Pull pants up/down Pants- Performed by helper: Thread/unthread right  pants leg, Thread/unthread left pants leg, Pull pants up/down Non-skid slipper socks- Performed by helper: Don/doff right sock, Don/doff left sock Socks - Performed by patient: Don/doff left sock, Don/doff right sock Shoes - Performed by patient: Don/doff right shoe, Don/doff left shoe, Fasten right, Fasten left Shoes - Performed by helper: Don/doff right shoe, Don/doff left shoe, Fasten right, Fasten left AFO - Performed by helper: Don/doff right AFO TED Hose - Performed by helper: Don/doff right TED hose, Don/doff left TED hose Assist for footwear: Setup Assist for  lower body dressing: (ModA) Assistive Device Comment: Reacher + shoe funnel Set up : To obtain clothing/put away  Function - Toileting Toileting activity did not occur: No continent bowel/bladder event Toileting steps completed by patient: Performs perineal hygiene Toileting steps completed by helper: (per Delsa Bern, NT) Toileting Assistive Devices: Grab bar or rail Assist level: Touching or steadying assistance (Pt.75%)  Function - Air cabin crew transfer activity did not occur: Safety/medical concerns Toilet transfer assistive device: Elevated toilet seat/BSC over toilet Assist level to toilet: Touching or steadying assistance (Pt > 75%) Assist level from toilet: Touching or steadying assistance (Pt > 75%)  Function - Chair/bed transfer Chair/bed transfer method: Lateral scoot Chair/bed transfer assist level: Supervision or verbal cues Chair/bed transfer assistive device: Armrests, Orthosis Chair/bed transfer details: Verbal cues for precautions/safety, Verbal cues for technique, Verbal cues for sequencing  Function - Locomotion: Wheelchair Will patient use wheelchair at discharge?: Yes Type: Manual Max wheelchair distance: 300 Assist Level: No help, No cues, assistive device, takes more than reasonable amount of time Assist Level: No help, No cues, assistive device, takes more than reasonable  amount of time Assist Level: No help, No cues, assistive device, takes more than reasonable amount of time Turns around,maneuvers to table,bed, and toilet,negotiates 3% grade,maneuvers on rugs and over doorsills: Yes Function - Locomotion: Ambulation Ambulation activity did not occur: Safety/medical concerns Assistive device: Walker-rolling, Orthosis Max distance: 53 Assist level: Touching or steadying assistance (Pt > 75%) Assist level: Touching or steadying assistance (Pt > 75%) Walk 50 feet with 2 turns activity did not occur: Safety/medical concerns Assist level: Touching or steadying assistance (Pt > 75%) Walk 150 feet activity did not occur: Safety/medical concerns Walk 10 feet on uneven surfaces activity did not occur: Safety/medical concerns  Function - Comprehension Comprehension: Auditory Comprehension assist level: Follows complex conversation/direction with no assist  Function - Expression Expression: Verbal Expression assist level: Expresses complex ideas: With no assist  Function - Social Interaction Social Interaction assist level: Interacts appropriately with others - No medications needed.  Function - Problem Solving Problem solving assist level: Solves complex problems: Recognizes & self-corrects  Function - Memory Memory assist level: Complete Independence: No helper Patient normally able to recall (first 3 days only): Current season, Location of own room, Staff names and faces, That he or she is in a hospital  Medical Problem List and Plan: 1.  Decreased functional mobility secondary to L3 body fracture with compression and posterior displacement and repositioning of lateral sacral screw extension of lumbar fusion to thoracic T12 09/02/2017 as well as history of multiple thoracic lumbar surgeries. Back brace when out of bed. CIR PT, OT, 2.  DVT Prophylaxis/Anticoagulation: SCDs. Check vascular study 3. Pain Management/chronic back pain: Baclofen 10 mg 3 times a  day, MS Contin 30 mg every 12 hours, Neurontin 300 mg 3 times a day, Celebrex 200 mg every 12 hours, Valium for muscle spasms and hydrocodone for breakthrough pain as needed  4. Mood: Provide emotional support 5. Neuropsych: This patient is capable of making decisions on her own behalf. 6. Skin/Wound Care: Routine skin checks 7. Fluids/Electrolytes/Nutrition: Routine I&O's with follow-up chemistries 8. Acute blood loss anemia. Follow-up CBC improved from 6/5 to 8.4 now drifted down to  7.8 after transfusion of 1 Unit 12/30, recheck today 9. Diabetes mellitus peripheral neuropathy. SSI. Hemoglobin A1c 6.0. Check blood sugars before meals and at bedtime. Patient on Glucophage 500 mg twice a day prior to admission. Resume as needed CBG (last 3) controlled Recent  Labs    09/16/17 1628 09/16/17 2108 09/17/17 0627  GLUCAP 136* 174* 116*  Controlled 1/4 10. Hypertension. Lisinopril 5 mg daily Vitals:   09/16/17 1341 09/17/17 0550  BP: (!) 119/52 140/73  Pulse: 75 80  Resp: 14 20  Temp: 98.2 F (36.8 C) 98.3 F (36.8 C)  SpO2: 024% 097%  Systolic elevation 1/1 monitor 11. Neurogenic bladder. Intermittent catheterizations 3 times a day permanent  Recurrent UTI Citrobacter S to  bactrim 12. Hyperlipidemia. Lipitor  13.  Lethargy- Improved was med related MSO4 and valium     14.  Hypoalb- Prostat 15.  Spasticity RLE adductors Hamstrings and ankle plantar flexors and invertors increase baclofen to QID LOS (Days) 10 A FACE TO FACE EVALUATION WAS PERFORMED  Luanna Salk Twylia Oka 09/17/2017, 8:16 AM

## 2017-09-19 ENCOUNTER — Inpatient Hospital Stay: Payer: BLUE CROSS/BLUE SHIELD | Admitting: Physical Medicine & Rehabilitation

## 2017-09-19 NOTE — Progress Notes (Signed)
Social Work Discharge Note  The overall goal for the admission was met for:   Discharge location: Yes - home with family  Length of Stay: Yes - 10 days  Discharge activity level: Yes - supervision w/c level for PT; supervision to min A for OT  Home/community participation: Yes  Services provided included: MD, RD, PT, OT, RN, Pharmacy, Neuropsych and SW  Financial Services: Private Insurance: [employer]  Follow-up services arranged: Home Health: PT/OT/RN and Patient/Family request agency HH: [facility], DME: Pt had all recommended DME from previous CIR admissions.  Comments (or additional information): Pt received a loaner chair from [employer] during this admission, as we are waiting for her chair to be delivered and additional paperwork has been requested from the physician.  Pt feels prepared to go home.  Supports at home are in place.  Patient/Family verbalized understanding of follow-up arrangements: Yes  Individual responsible for coordination of the follow-up plan: pt with her husband and dtr to provide care  Confirmed correct DME delivered: Trey Sailors 09/19/2017    Sherika Kubicki, Silvestre Mesi

## 2017-09-23 ENCOUNTER — Ambulatory Visit: Payer: BLUE CROSS/BLUE SHIELD | Admitting: Physical Medicine & Rehabilitation

## 2017-09-23 ENCOUNTER — Encounter: Payer: BLUE CROSS/BLUE SHIELD | Attending: Physical Medicine & Rehabilitation

## 2017-09-23 ENCOUNTER — Other Ambulatory Visit: Payer: Self-pay

## 2017-09-23 ENCOUNTER — Encounter: Payer: Self-pay | Admitting: Physical Medicine & Rehabilitation

## 2017-09-23 VITALS — BP 137/74 | HR 93

## 2017-09-23 DIAGNOSIS — K592 Neurogenic bowel, not elsewhere classified: Secondary | ICD-10-CM

## 2017-09-23 DIAGNOSIS — E785 Hyperlipidemia, unspecified: Secondary | ICD-10-CM | POA: Diagnosis not present

## 2017-09-23 DIAGNOSIS — Z87891 Personal history of nicotine dependence: Secondary | ICD-10-CM | POA: Insufficient documentation

## 2017-09-23 DIAGNOSIS — G8220 Paraplegia, unspecified: Secondary | ICD-10-CM | POA: Diagnosis not present

## 2017-09-23 DIAGNOSIS — N319 Neuromuscular dysfunction of bladder, unspecified: Secondary | ICD-10-CM | POA: Diagnosis not present

## 2017-09-23 DIAGNOSIS — K219 Gastro-esophageal reflux disease without esophagitis: Secondary | ICD-10-CM | POA: Insufficient documentation

## 2017-09-23 DIAGNOSIS — E119 Type 2 diabetes mellitus without complications: Secondary | ICD-10-CM | POA: Diagnosis not present

## 2017-09-23 DIAGNOSIS — M1990 Unspecified osteoarthritis, unspecified site: Secondary | ICD-10-CM | POA: Diagnosis not present

## 2017-09-23 DIAGNOSIS — I10 Essential (primary) hypertension: Secondary | ICD-10-CM | POA: Insufficient documentation

## 2017-09-23 DIAGNOSIS — G959 Disease of spinal cord, unspecified: Secondary | ICD-10-CM | POA: Insufficient documentation

## 2017-09-23 NOTE — Progress Notes (Signed)
Subjective:    Patient ID: Megan [name], female    DOB: 01-19-1951, 67 y.o.   MRN: [id number] 67 year old right-handed female with history of diabetes mellitus, chronic back pain, multiple thoracic lumbar surgeries, neurogenic bladder with catheterization 3 times daily, and received Inpatient Rehab Services, November of 2018, after multilevel lumbar laminectomy and lateral fixation.  She was discharged to home with spouse supervision for wheelchair to mat transfers.  She had been standing in the standing frame.  Presented on August 29, 2017, with nausea, vomiting, abdominal pain, frequent bowel movements. CT abdomen and pelvis showed distended rectum with stool.  Also, noted L3 body fracture with compression and posterior displacement.  Bowel program was regulated.  Neurosurgery consulted.  Underwent lumbar 3-4 redo laminectomy, repositioning of lateral sacral screw, extension of lumbar fusion to thoracic T12, September 02, 2017, per Dr. Cyndy Freeze. Hospital course, pain management, a wound VAC, and Hemovac had been in place.  Lumbar back corset when out of bed.  Treated for an E. coli UTI.   HPI   Rarely taking hydrocodone  Bowels doing ok with senna and colace, Miralax on occasion Bladder not emptying sometimes has sensation and leakage even whenm bladder vol is low Husband is performing intermittent catheterization 3 times per day BP and CBG doing ok Still with spasms, right lower extremity Pain Inventory Average Pain 4 Pain Right Now 0 My pain is burning and tingling  In the last 24 hours, has pain interfered with the following? General activity 4 Relation with others 0 Enjoyment of life 3 What TIME of day is your pain at its worst? evening Sleep (in general) Fair  Pain is worse with: unsure Pain improves with: medication Relief from Meds: 9  Mobility use a walker how many minutes can you walk? 2-3 ability to climb steps?  no do you drive?  no use a  wheelchair transfers alone Do you have any goals in this area?  yes  Function not employed: date last employed 02/23/16 retired I need assistance with the following:  meal prep, household duties and shopping Do you have any goals in this area?  yes  Neuro/Psych bladder control problems bowel control problems weakness numbness tingling trouble walking spasms  Prior Studies Any changes since last visit?  yes  Physicians involved in your care Any changes since last visit?  no   Family History  Problem Relation Age of Onset  . Diabetes Mother   . Dementia Mother   . Cirrhosis Mother        Non alcoholic  . COPD Father   . Diabetes Brother    Social History   Socioeconomic History  . Marital status: Married    Spouse name: [name]  . Number of children: 1  . Years of education: 48  . Highest education level: None  Social Needs  . Financial resource strain: None  . Food insecurity - worry: None  . Food insecurity - inability: None  . Transportation needs - medical: None  . Transportation needs - non-medical: None  Occupational History  . None  Tobacco Use  . Smoking status: Former Smoker    Packs/day: 1.50    Years: 37.00    Pack years: 55.50    Types: Cigarettes    Last attempt to quit: 05/13/2006    Years since quitting: 11.3  . Smokeless tobacco: Never Used  Substance and Sexual Activity  . Alcohol use: Yes    Comment: 07/26/2017 "glass of wine once/wk if not on  RX"  . Drug use: No  . Sexual activity: Not Currently    Birth control/protection: Surgical  Other Topics Concern  . None  Social History Narrative   Lives w/ husband   Married.   1 child, 1 grandchildren.   Retired. Once worked for [employer].   Enjoys reading.    Past Surgical History:  Procedure Laterality Date  . ABDOMINAL EXPOSURE N/A 07/26/2017   Procedure: ABDOMINAL EXPOSURE;  Surgeon: Angelia Mould, MD;  Location: [place];  Service: Vascular;  Laterality: N/A;  . ANTERIOR  CERVICAL DECOMP/DISCECTOMY FUSION  2001  . ANTERIOR LUMBAR FUSION N/A 07/26/2017   Procedure: Lumbar five-Sacral one Anterior lumbar interbody fusion with Dr. Deitra Mayo for approach;  Surgeon: Ditty, Kevan Ny, MD;  Location: [place];  Service: Neurosurgery;  Laterality: N/A;  . APPLICATION OF ROBOTIC ASSISTANCE FOR SPINAL PROCEDURE  12/19/2016   Procedure: APPLICATION OF ROBOTIC ASSISTANCE FOR SPINAL PROCEDURE;  Surgeon: Kevan Ny Ditty, MD;  Location: [place];  Service: Neurosurgery;;  . APPLICATION OF ROBOTIC ASSISTANCE FOR SPINAL PROCEDURE N/A 07/26/2017   Procedure: APPLICATION OF ROBOTIC ASSISTANCE FOR SPINAL PROCEDURE;  Surgeon: Ditty, Kevan Ny, MD;  Location: [place];  Service: Neurosurgery;  Laterality: N/A;  . APPLICATION OF ROBOTIC ASSISTANCE FOR SPINAL PROCEDURE N/A 09/02/2017   Procedure: APPLICATION OF ROBOTIC ASSISTANCE FOR SPINAL PROCEDURE;  Surgeon: Ditty, Kevan Ny, MD;  Location: [place];  Service: Neurosurgery;  Laterality: N/A;  . BACK SURGERY    . BLADDER SUSPENSION  1991   tack  . INGUINAL HERNIA REPAIR Right 1991  . LUMBAR FUSION  2016; 2017; 07/26/2017   L4-5; L2-3; L5-S1  . POSTERIOR LUMBAR FUSION 4 WITH HARDWARE REMOVAL N/A 09/02/2017   Procedure: Lumbar three-four redo laminectomy; Repositioning of Left Sacral two screw; Extension of lumbar fusion to Thoracic twelve;  Surgeon: Ditty, Kevan Ny, MD;  Location: [place];  Service: Neurosurgery;  Laterality: N/A;  . TONSILLECTOMY AND ADENOIDECTOMY  1959  . TUMOR EXCISION     WERTHIN'S TUMORS BOTH SIDES OF NECK  . VAGINAL HYSTERECTOMY  1979   Past Medical History:  Diagnosis Date  . Arthritis    "hands, back" (07/26/2017)  . Dyspnea    W/ PHYS CONDITION   . GERD (gastroesophageal reflux disease)   . History of bronchitis    "not since I quit smoking" (07/26/2017)  . History of kidney stones   . History of shingles   . Hyperlipidemia    takes Fish Oil daily  . Hypertension   .  Neuromuscular disorder (HCC)    tingling toes  . 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[place]) 12/15/2016  . Pneumonia 2009  . PONV (postoperative nausea and vomiting)   . Restless leg   . Type 2 diabetes mellitus ([place])   . Uterine cancer ([place]) 1979   S/P hysterectomy  . Weakness    numbness and tingling in both feet r/t back   BP 137/74   Pulse 93   SpO2 90%   Opioid Risk Score:  0 Fall Risk Score:  `1  Depression screen PHQ 2/9  Depression screen [facility] 2/9 09/23/2017 09/23/2017 01/07/2017  Decreased Interest 0 0 0  Down, Depressed, Hopeless 1 0 0  PHQ - 2 Score 1 0 0  Altered sleeping 1 - 0  Tired, decreased energy 1 - 1  Change in appetite 1 - 0  Feeling bad or failure about yourself  1 - 0  Trouble concentrating 0 - 0  Moving slowly or  fidgety/restless 0 - 0  Suicidal thoughts 0 - 0  PHQ-9 Score 5 - 1  Difficult doing work/chores Not difficult at all - -      Review of Systems  Constitutional: Positive for appetite change.       High blood sugar  HENT: Negative.   Eyes: Negative.   Respiratory: Negative.   Cardiovascular: Positive for leg swelling.  Gastrointestinal: Negative.   Endocrine: Negative.   Genitourinary: Negative.   Musculoskeletal: Negative.   Skin: Negative.   Allergic/Immunologic: Negative.   Neurological: Negative.   Hematological: Negative.   Psychiatric/Behavioral: Negative.        Objective:   Physical Exam  Constitutional: She is oriented to person, place, and time. She appears well-developed and well-nourished. No distress.  HENT:  Head: Normocephalic and atraumatic.  Eyes: Conjunctivae and EOM are normal. Pupils are equal, round, and reactive to light.  Neck: Normal range of motion.  Neurological: She is alert and oriented to person, place, and time.  Skin: Skin is warm and dry. She is not diaphoretic.  Psychiatric: She has a normal mood and affect. Her behavior is normal.  Nursing note and vitals reviewed. Motor strength is 5/5 bilateral  deltoid bicep tricep grip  2- right hip flexion knee extension ankle dorsiflexor 3- in the left hip flexion extension ankle dorsiflexor Has clonus at the right ankle no clonus in the left ankle Tone modified [place] 1 at the quadriceps and hamstrings on the right side. -With 0 at the left quadricep and hamstring Ambulation not tested does not have an assistive device with her.        Assessment & Plan:  #1.  Paraparesis with neurogenic bowel bladder has history of thoracic myelopathy as well as left lumbar radiculopathy.  She still has significant right lower extremity spasticity and weakness.  She has milder left lower extremity weakness but no spasticity. Continue home health PT OT Follow-up with Dr. Cyndy Freeze from neurosurgery  Will make referral to neuro urology, Dr. Wendy Poet

## 2017-09-23 NOTE — Patient Instructions (Signed)
Please call primary for med refills

## 2017-09-26 ENCOUNTER — Telehealth: Payer: Self-pay

## 2017-09-26 NOTE — Telephone Encounter (Signed)
[facility] called requesting verbal orders for 1xwk X 5wks, verbal orders were approved at time of request.

## 2017-09-28 ENCOUNTER — Inpatient Hospital Stay: Payer: BLUE CROSS/BLUE SHIELD | Admitting: Primary Care

## 2017-09-29 ENCOUNTER — Telehealth: Payer: Self-pay | Admitting: Primary Care

## 2017-09-29 NOTE — Telephone Encounter (Signed)
Please see message below. Is that okay?

## 2017-09-29 NOTE — Telephone Encounter (Signed)
Okay to pick up a cup, please include wipes and instructions for home collection. Will need the specimen to be as fresh as possible so I recommend she collect the sample as close to her appointment time as possible.

## 2017-09-29 NOTE — Telephone Encounter (Signed)
Copied from [place] [id number]. Topic: Quick Communication - See Telephone Encounter >> Sep 29, 2017  3:42 PM Oneta Rack wrote: CRM for notification. See Telephone encounter for:   09/29/17.  Caller name: Relation to pt: Self  Call back number: [phone]  Pharmacy: [facility] [place], [place] [phone] (Phone) [phone] (Fax)    Reason for call:  Patient scheduled UTI acute appointment for 10/03/17 and states she will not be able to go to the bathroom at the time of appointment and would like to pick up a urine cup., please advise

## 2017-09-30 ENCOUNTER — Encounter: Payer: Self-pay | Admitting: Primary Care

## 2017-09-30 ENCOUNTER — Ambulatory Visit: Payer: BLUE CROSS/BLUE SHIELD | Admitting: Primary Care

## 2017-09-30 VITALS — BP 130/62 | HR 76 | Temp 98.1°F | Wt 166.0 lb

## 2017-09-30 DIAGNOSIS — N3000 Acute cystitis without hematuria: Secondary | ICD-10-CM

## 2017-09-30 DIAGNOSIS — R300 Dysuria: Secondary | ICD-10-CM

## 2017-09-30 DIAGNOSIS — G894 Chronic pain syndrome: Secondary | ICD-10-CM

## 2017-09-30 LAB — POC URINALSYSI DIPSTICK (AUTOMATED)
Bilirubin, UA: NEGATIVE
GLUCOSE UA: NEGATIVE
Ketones, UA: NEGATIVE
Nitrite, UA: NEGATIVE
Protein, UA: NEGATIVE
RBC UA: NEGATIVE
SPEC GRAV UA: 1.02 (ref 1.010–1.025)
Urobilinogen, UA: 0.2 E.U./dL
pH, UA: 6 (ref 5.0–8.0)

## 2017-09-30 MED ORDER — SULFAMETHOXAZOLE-TRIMETHOPRIM 800-160 MG PO TABS: 1.0000 | ORAL_TABLET | Freq: Two times a day (BID) | ORAL | 0 refills | Status: DC

## 2017-09-30 NOTE — Progress Notes (Signed)
Subjective:    Patient ID: Megan [name], female    DOB: 11-Mar-1951, 67 y.o.   MRN: [id number]  HPI  [name] is a 67 year old female with a history of spondylogenic compression of thoracic spine, myelopathy, paraplegia, radiculopathy who presents today with a chief complaint of dysuria.   She also reports suprapubic pressure, foul smelling urine. Her husband is catheterizing her three times daily and endorses good sanitization. She had to start self catheterizing in November 2018 which was initiated during her hospital stay. She denies vaginal discharge, vaginal bleeding, fevers, abdominal pain. Her symptoms began yesterday. She's not taken anything OTC for her symptoms.   She will be released from her pain management doctor in March 2019 and is requesting we take over her medications. She's taking gabapentin, celebrex, and hydrocodone. She's limiting use of hydrocodone for severe episodes of right lower extremity spasms and pain.    Review of Systems  Constitutional: Negative for fever.  Gastrointestinal: Negative for abdominal pain.  Genitourinary: Positive for dysuria. Negative for frequency, hematuria, pelvic pain, vaginal bleeding and vaginal discharge.  Musculoskeletal:       Chronic low back pain, lower extremity pain       Past Medical History:  Diagnosis Date  . Arthritis    "hands, back" (07/26/2017)  . Dyspnea    W/ PHYS CONDITION   . GERD (gastroesophageal reflux disease)   . History of bronchitis    "not since I quit smoking" (07/26/2017)  . History of kidney stones   . History of shingles   . Hyperlipidemia    takes Fish Oil daily  . Hypertension   . Neuromuscular disorder (HCC)    tingling toes  . 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[place]) 12/15/2016  . Pneumonia 2009  . PONV (postoperative nausea and vomiting)   . Restless leg   . Type 2 diabetes mellitus ([place])   . Uterine cancer ([place]) 1979   S/P hysterectomy  . Weakness    numbness and tingling in both  feet r/t back     Social History   Socioeconomic History  . Marital status: Married    Spouse name: Megan [name]  . Number of children: 1  . Years of education: 36  . Highest education level: Not on file  Social Needs  . Financial resource strain: Not on file  . Food insecurity - worry: Not on file  . Food insecurity - inability: Not on file  . Transportation needs - medical: Not on file  . Transportation needs - non-medical: Not on file  Occupational History  . Not on file  Tobacco Use  . Smoking status: Former Smoker    Packs/day: 1.50    Years: 37.00    Pack years: 55.50    Types: Cigarettes    Last attempt to quit: 05/13/2006    Years since quitting: 11.3  . Smokeless tobacco: Never Used  Substance and Sexual Activity  . Alcohol use: Yes    Comment: 07/26/2017 "glass of wine once/wk if not on RX"  . Drug use: No  . Sexual activity: Not Currently    Birth control/protection: Surgical  Other Topics Concern  . Not on file  Social History Narrative   Lives w/ husband   Married.   1 child, 1 grandchildren.   Retired. Once worked for [employer].   Enjoys reading.     Past Surgical History:  Procedure Laterality Date  . ABDOMINAL EXPOSURE N/A 07/26/2017   Procedure: ABDOMINAL EXPOSURE;  Surgeon: Angelia Mould, MD;  Location: [place];  Service: Vascular;  Laterality: N/A;  . ANTERIOR CERVICAL DECOMP/DISCECTOMY FUSION  2001  . ANTERIOR LUMBAR FUSION N/A 07/26/2017   Procedure: Lumbar five-Sacral one Anterior lumbar interbody fusion with Dr. Deitra Mayo for approach;  Surgeon: Ditty, Kevan Ny, MD;  Location: [place];  Service: Neurosurgery;  Laterality: N/A;  . APPLICATION OF ROBOTIC ASSISTANCE FOR SPINAL PROCEDURE  12/19/2016   Procedure: APPLICATION OF ROBOTIC ASSISTANCE FOR SPINAL PROCEDURE;  Surgeon: Kevan Ny Ditty, MD;  Location: [place];  Service: Neurosurgery;;  . APPLICATION OF ROBOTIC ASSISTANCE FOR SPINAL PROCEDURE N/A 07/26/2017   Procedure:  APPLICATION OF ROBOTIC ASSISTANCE FOR SPINAL PROCEDURE;  Surgeon: Ditty, Kevan Ny, MD;  Location: [place];  Service: Neurosurgery;  Laterality: N/A;  . APPLICATION OF ROBOTIC ASSISTANCE FOR SPINAL PROCEDURE N/A 09/02/2017   Procedure: APPLICATION OF ROBOTIC ASSISTANCE FOR SPINAL PROCEDURE;  Surgeon: Ditty, Kevan Ny, MD;  Location: [place];  Service: Neurosurgery;  Laterality: N/A;  . BACK SURGERY    . BLADDER SUSPENSION  1991   tack  . INGUINAL HERNIA REPAIR Right 1991  . LUMBAR FUSION  2016; 2017; 07/26/2017   L4-5; L2-3; L5-S1  . POSTERIOR LUMBAR FUSION 4 WITH HARDWARE REMOVAL N/A 09/02/2017   Procedure: Lumbar three-four redo laminectomy; Repositioning of Left Sacral two screw; Extension of lumbar fusion to Thoracic twelve;  Surgeon: Ditty, Kevan Ny, MD;  Location: [place];  Service: Neurosurgery;  Laterality: N/A;  . TONSILLECTOMY AND ADENOIDECTOMY  1959  . TUMOR EXCISION     WERTHIN'S TUMORS BOTH SIDES OF NECK  . VAGINAL HYSTERECTOMY  1979    Family History  Problem Relation Age of Onset  . Diabetes Mother   . Dementia Mother   . Cirrhosis Mother        Non alcoholic  . COPD Father   . Diabetes Brother     Allergies  Allergen Reactions  . Shellfish Allergy Anaphylaxis, Swelling and Other (See Comments)    Tongue swells  . Oxycodone Nausea Only  . Penicillins Rash and Other (See Comments)    Has patient had a PCN reaction causing immediate rash, facial/tongue/throat swelling, SOB or lightheadedness with hypotension: Yes Has patient had a PCN reaction causing severe rash involving mucus membranes or skin necrosis: No Has patient had a PCN reaction that required hospitalization No Has patient had a PCN reaction occurring within the last 10 years: No If all of the above answers are "NO", then may proceed with Cephalosporin use.    Current Outpatient Medications on File Prior to Visit  Medication Sig Dispense Refill  . atorvastatin (LIPITOR) 20 MG tablet Take 1  tablet (20 mg total) by mouth every evening. 90 tablet 3  . baclofen (LIORESAL) 10 MG tablet Take 1 tablet (10 mg total) by mouth 4 (four) times daily. 180 each 0  . bethanechol (URECHOLINE) 5 MG tablet Take 5 mg by mouth 3 (three) times daily.  0  . Biotin 5000 MCG CAPS Take 5,000 mcg by mouth every morning.     . bisacodyl (DULCOLAX) 5 MG EC tablet Take 1 tablet (5 mg total) by mouth daily as needed for moderate constipation. 30 tablet 0  . celecoxib (CELEBREX) 200 MG capsule Take 1 capsule (200 mg total) by mouth every 12 (twelve) hours. 60 capsule 0  . cholecalciferol (VITAMIN D) 1000 units tablet Take 2 tablets (2,000 Units total) by mouth daily after lunch. (Patient taking differently: Take 1,000 Units by mouth daily  after lunch. ) 30 tablet 0  . Coenzyme Q10 (COQ10) 100 MG CAPS Take 100 mg daily by mouth.     . docusate sodium (COLACE) 100 MG capsule Take 1 capsule (100 mg total) by mouth 2 (two) times daily. 10 capsule 0  . esomeprazole (NEXIUM 24HR) 20 MG capsule Take 1 capsule (20 mg total) by mouth daily at 12 noon. 30 capsule 0  . ferrous sulfate 325 (65 FE) MG tablet Take 1 tablet (325 mg total) by mouth 2 (two) times daily with a meal. 60 tablet 3  . gabapentin (NEURONTIN) 300 MG capsule Take 1 capsule (300 mg total) by mouth 3 (three) times daily. 90 capsule 2  . Garlic 476 MG CAPS Take 500 mg 2 (two) times daily by mouth.    Marland Kitchen HYDROcodone-acetaminophen (NORCO) 7.5-325 MG tablet Take 1-2 tablets by mouth every 4 (four) hours as needed (1 tab for moderate pain, 2 tabs for severe pain). 30 tablet 0  . lisinopril (PRINIVIL,ZESTRIL) 5 MG tablet Take 1 tablet (5 mg total) by mouth daily. 30 tablet 0  . metFORMIN (GLUCOPHAGE) 500 MG tablet Take 1 tablet (500 mg total) by mouth 2 (two) times daily with a meal. 180 tablet 2  . polyethylene glycol (MIRALAX / GLYCOLAX) packet Take 17 g by mouth daily. (Patient taking differently: Take 17 g by mouth daily as needed for mild constipation. ) 14  each 0  . senna-docusate (SENOKOT-S) 8.6-50 MG tablet Take 2 tablets by mouth 2 (two) times daily. (Patient taking differently: Take 1-2 tablets by mouth daily as needed for mild constipation. )    . tamsulosin (FLOMAX) 0.4 MG CAPS capsule Take 1 capsule (0.4 mg total) by mouth daily after supper. 30 capsule 0   No current facility-administered medications on file prior to visit.     BP 130/62   Pulse 76   Temp 98.1 F (36.7 C) (Oral)   Wt 166 lb (75.3 kg)   SpO2 99%   BMI 25.24 kg/m    Objective:   Physical Exam  Constitutional: She appears well-nourished.  Neck: Neck supple.  Cardiovascular: Normal rate and regular rhythm.  Pulmonary/Chest: Effort normal and breath sounds normal.  Abdominal: Soft. Normal appearance and bowel sounds are normal. There is no tenderness. There is no CVA tenderness.  Skin: Skin is warm and dry.  Psychiatric: She has a normal mood and affect.          Assessment & Plan:  Dysuria:  Present since yesterday. Family catheterizing her and endorses good sanitation.  Exam today stable. UA was provided from home from catheter kit. 2+ leuks, negative nitrites, negative blood. Cloudy with foul smell. Culture sent. Will treat with oral bactrim DS tablet course. Push intake of water and rest.  Sheral Flow, NP

## 2017-09-30 NOTE — Telephone Encounter (Signed)
Was has been eval on 09/30/2017

## 2017-09-30 NOTE — Assessment & Plan Note (Signed)
Discussed to use hydrocodone sparingly with goal of coming off. Will take over once she's discharged from pain management as long as she's using infrequently.   Continue gabapentin and celebrex.

## 2017-09-30 NOTE — Patient Instructions (Addendum)
Start Bactrim DS (sulfamethoxazole/trimethoprim) tablets for urinary tract infection. Take 1 tablet by mouth twice daily for 5 days.  Ensure you are staying hydrated with water and rest.  Schedule a 30 minute follow up visit in May 2019, come fasting.  It was a pleasure to see you today!

## 2017-10-03 ENCOUNTER — Other Ambulatory Visit: Payer: Self-pay | Admitting: Primary Care

## 2017-10-03 ENCOUNTER — Ambulatory Visit: Payer: BLUE CROSS/BLUE SHIELD | Admitting: Primary Care

## 2017-10-03 ENCOUNTER — Telehealth: Payer: Self-pay | Admitting: Primary Care

## 2017-10-03 DIAGNOSIS — N3000 Acute cystitis without hematuria: Secondary | ICD-10-CM

## 2017-10-03 LAB — URINE CULTURE
MICRO NUMBER: 90078524
SPECIMEN QUALITY: ADEQUATE

## 2017-10-03 MED ORDER — NITROFURANTOIN MONOHYD MACRO 100 MG PO CAPS: 100.0000 mg | ORAL_CAPSULE | Freq: Two times a day (BID) | ORAL | 0 refills | Status: DC

## 2017-10-03 MED ORDER — CIPROFLOXACIN HCL 500 MG PO TABS: 500.0000 mg | ORAL_TABLET | Freq: Two times a day (BID) | ORAL | 0 refills | Status: DC

## 2017-10-03 NOTE — Telephone Encounter (Signed)
Pt's pharmacy called in to be advised. They received Rx for ciprofloxacin  Pt is also taking T[place]ie 2 MG (prescribed by a different provider). She says that the two medications can not be mixed.    Please assist further.   CB: [phone]

## 2017-10-03 NOTE — Telephone Encounter (Signed)
Noted. Please notify patient that I'll send in Cisco for UTI. Take 1 capsule by mouth twice daily for 7 days. Do not take Ciprofloxacin.

## 2017-10-03 NOTE — Telephone Encounter (Signed)
Patient notified she voiced her understanding

## 2017-10-04 ENCOUNTER — Ambulatory Visit: Payer: BLUE CROSS/BLUE SHIELD | Admitting: Primary Care

## 2017-10-05 ENCOUNTER — Telehealth: Payer: Self-pay | Admitting: Primary Care

## 2017-10-05 DIAGNOSIS — M4727 Other spondylosis with radiculopathy, lumbosacral region: Secondary | ICD-10-CM

## 2017-10-05 DIAGNOSIS — K5903 Drug induced constipation: Secondary | ICD-10-CM

## 2017-10-05 DIAGNOSIS — K219 Gastro-esophageal reflux disease without esophagitis: Secondary | ICD-10-CM

## 2017-10-05 DIAGNOSIS — K592 Neurogenic bowel, not elsewhere classified: Secondary | ICD-10-CM

## 2017-10-05 DIAGNOSIS — G894 Chronic pain syndrome: Secondary | ICD-10-CM

## 2017-10-05 DIAGNOSIS — N319 Neuromuscular dysfunction of bladder, unspecified: Secondary | ICD-10-CM

## 2017-10-05 MED ORDER — BISACODYL 5 MG PO TBEC: 5.0000 mg | DELAYED_RELEASE_TABLET | Freq: Every day | ORAL | 0 refills | Status: DC | PRN

## 2017-10-05 MED ORDER — TAMSULOSIN HCL 0.4 MG PO CAPS: 0.4000 mg | ORAL_CAPSULE | Freq: Every day | ORAL | 1 refills | Status: DC

## 2017-10-05 MED ORDER — CELECOXIB 200 MG PO CAPS: 200.0000 mg | ORAL_CAPSULE | Freq: Two times a day (BID) | ORAL | 0 refills | Status: DC | PRN

## 2017-10-05 MED ORDER — ESOMEPRAZOLE MAGNESIUM 20 MG PO CPDR: 20.0000 mg | DELAYED_RELEASE_CAPSULE | Freq: Every day | ORAL | 1 refills | Status: AC

## 2017-10-05 NOTE — Addendum Note (Signed)
Addended by: Pleas Koch on: 10/05/2017 05:13 PM   Modules accepted: Orders

## 2017-10-05 NOTE — Telephone Encounter (Signed)
Copied from [place]. Topic: Quick Communication - See Telephone Encounter >> Oct 05, 2017  1:08 PM Ether Griffins B wrote: CRM for notification. See Telephone encounter for:  PA at the doctors office where pt was having rehab is needing refills on celecoxib and esomeprazole and tamsulasn. They are wanting her provider to take over these medication. She also has others but they arent needing filled right now.  10/05/17.

## 2017-10-05 NOTE — Telephone Encounter (Signed)
Okay to fill medications?

## 2017-10-05 NOTE — Telephone Encounter (Signed)
Pt called back in, she also need bisacodyl   Pharmacy :  [facility] [place], [place]

## 2017-10-05 NOTE — Telephone Encounter (Addendum)
Noted.  Refill sent to pharmacy.  Will review all medical conditions at upcoming visit this spring.

## 2017-10-06 ENCOUNTER — Telehealth: Payer: Self-pay | Admitting: Primary Care

## 2017-10-06 NOTE — Telephone Encounter (Signed)
Copied from [place] [id number]. Topic: Quick Communication - See Telephone Encounter >> Oct 06, 2017 12:48 PM Synthia Innocent wrote: CRM for notification. See Telephone encounter for:  Calling to check status of cath supplies that were to be sent to her home, [employer] [employer] [address] account # [phone]. Please advise.  10/06/17.

## 2017-10-06 NOTE — Telephone Encounter (Signed)
Megan [name] with [facility] is calling in regards to getting an order sent to [facility] for this pts intermittent cathter's. She said a verbal order will work. [place] phone #: [phone] account# [phone]

## 2017-10-07 ENCOUNTER — Telehealth: Payer: Self-pay

## 2017-10-07 NOTE — Telephone Encounter (Signed)
[facility] , [phone], called requesting verbal orders for this patient in reguards to her intermittent cath's.  Called her back, no answer, will try call again later.

## 2017-10-07 NOTE — Telephone Encounter (Signed)
Checked office, was sent to scan center today and faxed to sender.

## 2017-10-07 NOTE — Telephone Encounter (Signed)
Approved.  

## 2017-10-07 NOTE — Telephone Encounter (Signed)
[facility] back, it is actually written orders that were needed to be signed and sent to our office on 09-19-17.  They were calling to see if they had been completed.

## 2017-10-07 NOTE — Telephone Encounter (Signed)
Not sure you can check on my desk, I do seem to remember feeling something out for her this week however

## 2017-10-07 NOTE — Telephone Encounter (Signed)
I have spoken to patient regarding this since her visit on 09/30/2017. I have called [facility] on 10/05/2017 since they stated that Megan [name] will need to sign the order form. Confirm the office fax number with the rep on 10/05/2017.  I have called [facility] today 10/07/2017. I tried to give the verbal order for the catheter. I was told they will take this verbal but still need Megan [name] to sign the order. Confirm the office fax number again.

## 2017-10-13 NOTE — Telephone Encounter (Signed)
Received faxed order form. Will place in Kate's inbox.

## 2017-10-14 NOTE — Telephone Encounter (Signed)
Form signed and placed in Chan's inbox.

## 2017-10-14 NOTE — Telephone Encounter (Signed)
Form has been faxed to [facility] at [phone]

## 2017-10-17 ENCOUNTER — Other Ambulatory Visit: Payer: Self-pay | Admitting: Primary Care

## 2017-10-17 NOTE — Telephone Encounter (Signed)
Please get more information regarding the catheters. What type? What does the prescription need to say?  Also, during our meeting she endorsed that she doesn't use the hydrocodone regularly yet her last refill was on 09/16/17 for #30. Is she taking these daily? I don't recommend or really support DAILY use and would like her to take these more infrequently. She's still seeing pain management and will need to contact their office for refills until released.

## 2017-10-17 NOTE — Telephone Encounter (Signed)
Spoken and notified patient of Kate's comments. Patient verbalized understanding. 

## 2017-10-17 NOTE — Telephone Encounter (Signed)
Copied from [place] [id number]. Topic: Quick Communication - See Telephone Encounter >> Oct 17, 2017  8:32 AM Bea Graff, NT wrote: CRM for notification. See Telephone encounter for: Pt calling and states she has been having trouble getting her catheters from the company she normally gets them from and she out of them. She called [employer] and they can give her the catheters but need and rx from the doctor. Pt states if Alma Friendly can write the rx she can have her husband come pick the the rx up today and get her catheters. She also needs a refill of hydrocodone. Uses [facility] [address].   10/17/17.

## 2017-10-17 NOTE — Telephone Encounter (Signed)
I spoke with pt; pt using 14 fr in and out cath for women; not the self catheter. # 30 to a box and pt does not think will split a box. Pt needs next cath at 3 PM today and her husband will pick up rx at [facility] to take to [facility] supply. Also pt said she has 3 - 4 hydrocodone apap left; pt may not take any hydrocodone but may take one daily due to spasm in rt leg. Pt is getting in home PT but has never seen pain mgt. Pt request cb ASAP.

## 2017-10-17 NOTE — Telephone Encounter (Signed)
Noted, Rx for catheters ready for pick up and placed in Chan's inbox. Will address hydrocodone at later time.

## 2017-10-17 NOTE — Telephone Encounter (Signed)
  Pt requesting refill Hydrocodone apap Last refilled and qty; # 30 on 09/16/17 Last seen: acute 09/30/17 and last f/u 01/19/17 [facility]acy:[facility]. Please advise.

## 2017-11-07 ENCOUNTER — Other Ambulatory Visit: Payer: Self-pay | Admitting: Primary Care

## 2017-11-07 DIAGNOSIS — M4316 Spondylolisthesis, lumbar region: Secondary | ICD-10-CM

## 2017-11-07 DIAGNOSIS — M62838 Other muscle spasm: Secondary | ICD-10-CM

## 2017-11-07 NOTE — Telephone Encounter (Signed)
Copied from [place] [id number]. Topic: Quick Communication - Rx Refill/Question >> Nov 07, 2017  9:51 AM Oliver Pila B wrote: Medication: HYDROcodone-acetaminophen (NORCO) 7.5-325 MG tablet [[id number]] , baclofen (LIORESAL) 10 MG tablet [[id number]]    Has the patient contacted their pharmacy? Yes.     (Agent: If no, request that the patient contact the pharmacy for the refill.)   [facility]   Agent: Please be advised that RX refills may take up to 3 business days. We ask that you follow-up with your pharmacy.

## 2017-11-07 NOTE — Telephone Encounter (Signed)
LOV 09/30/17 Alma Friendly [employer]

## 2017-11-08 NOTE — Telephone Encounter (Signed)
Patient advised and is willing to try Tramadol.  Patient is still taking Baclofen routinely about every 4 hours.

## 2017-11-08 NOTE — Telephone Encounter (Signed)
Please notify patient that after some thought I'd like to get her off of the hydrocodone and switch to something like Tramadol as needed for pain. Let me know when you've spoken with her and I'll send a prescription for Tramadol.  Is she still taking baclofen? If so then how often?

## 2017-11-09 MED ORDER — BACLOFEN 10 MG PO TABS: ORAL_TABLET | ORAL | 0 refills | Status: DC

## 2017-11-09 MED ORDER — TRAMADOL HCL 50 MG PO TABS: ORAL_TABLET | ORAL | 0 refills | Status: DC

## 2017-11-09 NOTE — Telephone Encounter (Signed)
Please notify patient that I sent a prescription for tramadol, and a refill of her baclofen to the pharmacy.  Have her try to use the baclofen less frequently if possible.

## 2017-11-10 NOTE — Telephone Encounter (Signed)
Message left for patient to return my call.  

## 2017-11-15 NOTE — Telephone Encounter (Signed)
Spoken and notified patient of Kate's comments. Patient verbalized understanding. 

## 2017-11-18 ENCOUNTER — Ambulatory Visit: Payer: BLUE CROSS/BLUE SHIELD | Admitting: Physical Medicine & Rehabilitation

## 2017-11-21 ENCOUNTER — Ambulatory Visit (HOSPITAL_BASED_OUTPATIENT_CLINIC_OR_DEPARTMENT_OTHER): Payer: BLUE CROSS/BLUE SHIELD | Admitting: Physical Medicine & Rehabilitation

## 2017-11-21 ENCOUNTER — Encounter: Payer: Self-pay | Admitting: Physical Medicine & Rehabilitation

## 2017-11-21 ENCOUNTER — Encounter: Payer: BLUE CROSS/BLUE SHIELD | Attending: Physical Medicine & Rehabilitation

## 2017-11-21 VITALS — BP 153/87 | HR 68

## 2017-11-21 DIAGNOSIS — E785 Hyperlipidemia, unspecified: Secondary | ICD-10-CM | POA: Diagnosis not present

## 2017-11-21 DIAGNOSIS — G8220 Paraplegia, unspecified: Secondary | ICD-10-CM | POA: Diagnosis not present

## 2017-11-21 DIAGNOSIS — N319 Neuromuscular dysfunction of bladder, unspecified: Secondary | ICD-10-CM | POA: Diagnosis not present

## 2017-11-21 DIAGNOSIS — K219 Gastro-esophageal reflux disease without esophagitis: Secondary | ICD-10-CM | POA: Diagnosis not present

## 2017-11-21 DIAGNOSIS — E119 Type 2 diabetes mellitus without complications: Secondary | ICD-10-CM | POA: Diagnosis not present

## 2017-11-21 DIAGNOSIS — Z87891 Personal history of nicotine dependence: Secondary | ICD-10-CM | POA: Insufficient documentation

## 2017-11-21 DIAGNOSIS — I10 Essential (primary) hypertension: Secondary | ICD-10-CM | POA: Diagnosis not present

## 2017-11-21 DIAGNOSIS — M1990 Unspecified osteoarthritis, unspecified site: Secondary | ICD-10-CM | POA: Insufficient documentation

## 2017-11-21 DIAGNOSIS — G8222 Paraplegia, incomplete: Secondary | ICD-10-CM

## 2017-11-21 DIAGNOSIS — G959 Disease of spinal cord, unspecified: Secondary | ICD-10-CM | POA: Insufficient documentation

## 2017-11-21 NOTE — Progress Notes (Signed)
Subjective:    Patient ID: Megan [name], female    DOB: 17-Mar-1951, 67 y.o.   MRN: [id number] Underwent left T10 costotransversectomy, T10-T11 laminectomy for decompression, pedicle screws, T9-T12, December 19, 2016, per Dr. Cyndy Freeze.  CIR DATE OF ADMISSION:  12/21/2016 DATE OF DISCHARGE:  01/04/2017  Admitted July 26, 2017, with progressive back pain, lower extremity weakness.  X-rays revealed lumbosacral spondylosis, radiculopathy.  Underwent L5-S1 anterior retroperitoneal exposure, L5-S1 laminectomy, L5-S1 posterior lateral fixation with fusion on July 26, 2017, per Dr. Cyndy Freeze. CIR DATE OF ADMISSION:  07/28/2017 DATE OF DISCHARGE:  08/19/2017   67 year old right-handed female with history of diabetes mellitus, chronic back pain, multiple thoracic lumbar surgeries, neurogenic bladder with catheterization 3 times daily, and received Inpatient Rehab Services, November of 2018, after multilevel lumbar laminectomy and lateral fixation.  She was discharged to home with spouse supervision for wheelchair to mat transfers.  She had been standing in the standing frame.  Presented on August 29, 2017, with nausea, vomiting, abdominal pain, frequent bowel movements. CT abdomen and pelvis showed distended rectum with stool.  Also, noted L3 body fracture with compression and posterior displacement.  Bowel program was regulated.  Neurosurgery consulted.  Underwent lumbar 3-4 redo laminectomy, repositioning of lateral sacral screw, extension of lumbar fusion to thoracic T12, September 02, 2017, per Dr. Cyndy Freeze. Hospital course, pain management, a wound VAC, and Hemovac had been in place.  Lumbar back corset when out of bed.  Treated for an E. coli UTI. CIR DATE OF ADMISSION:  09/07/2017 DATE OF DISCHARGE:  09/17/2017  HPI Another UTI since last visit   Urology referral Dr Billee Cashing Diarmid.  Voiding without residual No longer using intermittent catheterization Asking to come off  Tamulosin  Bowel usually daily.  Last needed laxative ~2wks ago  Still has RLE> LLE spasms, on Baclofen TID Taking Gabapentin for many years  Using walker for ambulation, uses Rollator.  Doing home health therapy.  Ambulation distance ~11ft Has been told she has ~4wks left of HH  Takes methocarbamol TID Told by Neurosurgery to wear brace for 3 months post   Amb to bathroom with walker at night without husband    Pain Inventory Average Pain 1 Pain Right Now 1 My pain is intermittent  In the last 24 hours, has pain interfered with the following? General activity 4 Relation with others 2 Enjoyment of life 5 What TIME of day is your pain at its worst? night Sleep (in general) Fair  Pain is worse with: walking Pain improves with: rest Relief from Meds: 7  Mobility use a walker ability to climb steps?  yes do you drive?  no  Function disabled: date disabled 2017 I need assistance with the following:  dressing, bathing, household duties and shopping  Neuro/Psych numbness tingling trouble walking spasms  Prior Studies Any changes since last visit?  no  Physicians involved in your care Any changes since last visit?  no   Family History  Problem Relation Age of Onset  . Diabetes Mother   . Dementia Mother   . Cirrhosis Mother        Non alcoholic  . COPD Father   . Diabetes Brother    Social History   Socioeconomic History  . Marital status: Married    Spouse name: [name]  . Number of children: 1  . Years of education: 32  . Highest education level: None  Social Needs  . Financial resource strain: None  . Food insecurity - worry: None  .  Food insecurity - inability: None  . Transportation needs - medical: None  . Transportation needs - non-medical: None  Occupational History  . None  Tobacco Use  . Smoking status: Former Smoker    Packs/day: 1.50    Years: 37.00    Pack years: 55.50    Types: Cigarettes    Last attempt to quit: 05/13/2006     Years since quitting: 11.5  . Smokeless tobacco: Never Used  Substance and Sexual Activity  . Alcohol use: Yes    Comment: 07/26/2017 "glass of wine once/wk if not on RX"  . Drug use: No  . Sexual activity: Not Currently    Birth control/protection: Surgical  Other Topics Concern  . None  Social History Narrative   Lives w/ husband   Married.   1 child, 1 grandchildren.   Retired. Once worked for [employer].   Enjoys reading.    Past Surgical History:  Procedure Laterality Date  . ABDOMINAL EXPOSURE N/A 07/26/2017   Procedure: ABDOMINAL EXPOSURE;  Surgeon: Angelia Mould, MD;  Location: [place];  Service: Vascular;  Laterality: N/A;  . ANTERIOR CERVICAL DECOMP/DISCECTOMY FUSION  2001  . ANTERIOR LUMBAR FUSION N/A 07/26/2017   Procedure: Lumbar five-Sacral one Anterior lumbar interbody fusion with Dr. Deitra Mayo for approach;  Surgeon: Ditty, Kevan Ny, MD;  Location: [place];  Service: Neurosurgery;  Laterality: N/A;  . APPLICATION OF ROBOTIC ASSISTANCE FOR SPINAL PROCEDURE  12/19/2016   Procedure: APPLICATION OF ROBOTIC ASSISTANCE FOR SPINAL PROCEDURE;  Surgeon: Kevan Ny Ditty, MD;  Location: [place];  Service: Neurosurgery;;  . APPLICATION OF ROBOTIC ASSISTANCE FOR SPINAL PROCEDURE N/A 07/26/2017   Procedure: APPLICATION OF ROBOTIC ASSISTANCE FOR SPINAL PROCEDURE;  Surgeon: Ditty, Kevan Ny, MD;  Location: [place];  Service: Neurosurgery;  Laterality: N/A;  . APPLICATION OF ROBOTIC ASSISTANCE FOR SPINAL PROCEDURE N/A 09/02/2017   Procedure: APPLICATION OF ROBOTIC ASSISTANCE FOR SPINAL PROCEDURE;  Surgeon: Ditty, Kevan Ny, MD;  Location: [place];  Service: Neurosurgery;  Laterality: N/A;  . BACK SURGERY    . BLADDER SUSPENSION  1991   tack  . INGUINAL HERNIA REPAIR Right 1991  . LUMBAR FUSION  2016; 2017; 07/26/2017   L4-5; L2-3; L5-S1  . POSTERIOR LUMBAR FUSION 4 WITH HARDWARE REMOVAL N/A 09/02/2017   Procedure: Lumbar three-four redo laminectomy;  Repositioning of Left Sacral two screw; Extension of lumbar fusion to Thoracic twelve;  Surgeon: Ditty, Kevan Ny, MD;  Location: [place];  Service: Neurosurgery;  Laterality: N/A;  . TONSILLECTOMY AND ADENOIDECTOMY  1959  . TUMOR EXCISION     WERTHIN'S TUMORS BOTH SIDES OF NECK  . VAGINAL HYSTERECTOMY  1979   Past Medical History:  Diagnosis Date  . Arthritis    "hands, back" (07/26/2017)  . Dyspnea    W/ PHYS CONDITION   . GERD (gastroesophageal reflux disease)   . History of bronchitis    "not since I quit smoking" (07/26/2017)  . History of kidney stones   . History of shingles   . Hyperlipidemia    takes Fish Oil daily  . Hypertension   . Neuromuscular disorder (HCC)    tingling toes  . 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[place]) 12/15/2016  . Pneumonia 2009  . PONV (postoperative nausea and vomiting)   . Restless leg   . Type 2 diabetes mellitus ([place])   . Uterine cancer ([place]) 1979   S/P hysterectomy  . Weakness    numbness and tingling in both feet r/t back  BP (!) 153/87   Pulse 68   SpO2 97%   Opioid Risk Score:   Fall Risk Score:  `1  Depression screen PHQ 2/9  Depression screen [facility] 2/9 09/23/2017 09/23/2017 01/07/2017  Decreased Interest 0 0 0  Down, Depressed, Hopeless 1 0 0  PHQ - 2 Score 1 0 0  Altered sleeping 1 - 0  Tired, decreased energy 1 - 1  Change in appetite 1 - 0  Feeling bad or failure about yourself  1 - 0  Trouble concentrating 0 - 0  Moving slowly or fidgety/restless 0 - 0  Suicidal thoughts 0 - 0  PHQ-9 Score 5 - 1  Difficult doing work/chores Not difficult at all - -    Review of Systems  Constitutional: Negative.   HENT: Negative.   Eyes: Negative.   Respiratory: Negative.   Cardiovascular: Negative.   Gastrointestinal: Negative.   Endocrine: Negative.   Genitourinary: Negative.   Musculoskeletal: Negative.   Skin: Negative.   Allergic/Immunologic: Negative.   Neurological: Negative.   Hematological: Negative.     Psychiatric/Behavioral: Negative.   All other systems reviewed and are negative.      Objective:   Physical Exam  Constitutional: She is oriented to person, place, and time. She appears well-developed and well-nourished. No distress.  HENT:  Head: Normocephalic and atraumatic.  Eyes: Conjunctivae and EOM are normal. Pupils are equal, round, and reactive to light.  Neck: Normal range of motion.  Neurological: She is alert and oriented to person, place, and time. A sensory deficit is present. Coordination and gait abnormal.  Reflex Scores:      Tricep reflexes are 3+ on the right side.      Bicep reflexes are 3+ on the right side.      Brachioradialis reflexes are 3+ on the right side.      Patellar reflexes are 3+ on the right side.      Achilles reflexes are 3+ on the right side. Reduced sensation Right medial thigh and leg  Sit to stand with hand-held support.  Will walk with her husband short step length tends to drag right leg.  Some clonus noted at the right knee  Upper extremity strength is normal Right lower limb is 3- at the hip flexors 3 at the knee extensors 3- at the ankle dorsiflexor left lower extremity 3 at the hip flexor 4 at the knee extensor 4 at the ankle dorsiflexor  Skin: Skin is warm and dry. She is not diaphoretic.  Psychiatric: She has a normal mood and affect. Her behavior is normal. Judgment and thought content normal.  Nursing note and vitals reviewed.         Assessment & Plan:  #1.  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  She will follow-up with neurosurgery.  Neurosurgeon has switched from Dr. Cyndy Freeze to Dr. Vertell Limber  2.  Left lower extremity radiculopathy.  L5-S1 decompression and fusion  3.  Neurogenic bowel and bladder improved no longer requiring intermittent catheterization or a bowel program.   4.  Spasticity overall improved did not get very good relief with oral baclofen, did not  tolerate tizanidine.  She is currently taking methocarbamol just at night to help her sleep through spasms.  Not a candidate for Botox at this time.  We will see the patient back on as-needed basis.  Will make orders for outpatient therapy. Discussed medication list with patient and husband May discontinue Flomax.  If her  urinary retention recurs will need to restart  Over half of the 25 min visit was spent counseling and coordinating care.

## 2017-12-23 ENCOUNTER — Telehealth (INDEPENDENT_AMBULATORY_CARE_PROVIDER_SITE_OTHER): Payer: BLUE CROSS/BLUE SHIELD | Admitting: *Deleted

## 2017-12-23 DIAGNOSIS — R8290 Unspecified abnormal findings in urine: Secondary | ICD-10-CM | POA: Diagnosis not present

## 2017-12-23 NOTE — Telephone Encounter (Signed)
She really needs to be seen. Her husband can come pick up a sterile urinary collection kit from our office, but I'll have to send it off for culture. She should really be seeing urology, does she have a urologist?

## 2017-12-23 NOTE — Telephone Encounter (Deleted)
She really needs to be seen. Her husband can come pick up a sterile urinary collection kit from our office, but I'll have to send it off for culture. She should really be seeing urology, does she have a urologist?

## 2017-12-23 NOTE — Telephone Encounter (Signed)
Copied from [place] [id number]. Topic: Appointment Scheduling - Scheduling Inquiry for Clinic >> Dec 23, 2017 11:32 AM Lennox Solders wrote: Reason for CRM:  . Pt had 3 back surgeries last year. Pt  unable to come to office. Pt has a catheter she can use to obtain urine sample and would like her husband to drop off a sample. Pt is having to go urinate every 2 hrs also urine is dark and has odor. Pt also states sometime she has pressure before she urinates

## 2017-12-26 NOTE — Telephone Encounter (Signed)
Per DPR, left detail message of Tawni Millers comments for patient to call back.

## 2017-12-26 NOTE — Telephone Encounter (Signed)
Spoken and notified patient of Tawni Millers comments. Patient verbalized understanding.  Patient did not respond regarding the urologist question. Will ask again when urine collected.

## 2017-12-27 LAB — POC URINALSYSI DIPSTICK (AUTOMATED)
Bilirubin, UA: NEGATIVE
Blood, UA: NEGATIVE
Glucose, UA: NEGATIVE
Ketones, UA: NEGATIVE
NITRITE UA: NEGATIVE
PROTEIN UA: NEGATIVE
SPEC GRAV UA: 1.015 (ref 1.010–1.025)
UROBILINOGEN UA: 0.2 U/dL
pH, UA: 6 (ref 5.0–8.0)

## 2017-12-27 MED ORDER — CIPROFLOXACIN HCL 500 MG PO TABS: 500.0000 mg | ORAL_TABLET | Freq: Two times a day (BID) | ORAL | 0 refills | Status: DC

## 2017-12-27 NOTE — Telephone Encounter (Signed)
Patient dropped off a urine sample. I am unsure what to do with it. Can you please add orders? Thanks-Lacoya Wilbanks

## 2017-12-27 NOTE — Telephone Encounter (Signed)
Noted. Based on her urine culture from January she was resistant to Bactrim. She has an allergy to PCN group so will avoid Cephalosporins. Renal function reviewed and Cipro is a good option. Rx for Cipro 500 mg BID x 5 days sent to pharmacy. Will await urine culture.

## 2017-12-27 NOTE — Telephone Encounter (Signed)
UA has been done and urine culture sent. Will forward telephone encounter to Allie Bossier.

## 2017-12-27 NOTE — Telephone Encounter (Addendum)
When I spoken with patient. She stated that she had husband come and pick up the sterile cup this morning around 7:30-8ish am that I have left in the front office to pick up.Megan [name] brought the urine to me about 10 am.  Patient stated that she does not been seen by urology and does not want to right now. Patient stated that whenever the hospital do a cathter, she tends to get a UTI.   Patient request if possible for Allie Bossier not send Macrobid due to this Rx the last time gave her very bad diarrhea.

## 2017-12-27 NOTE — Telephone Encounter (Signed)
If she used her own container then we need to wait for a urine culture. Did she use her container or a sterile one from our stock? How long did the urine sit before it was brought over to the clinic? Is she being seen by urology?

## 2017-12-30 LAB — URINE CULTURE
MICRO NUMBER:: 90467059
SPECIMEN QUALITY:: ADEQUATE

## 2018-01-02 ENCOUNTER — Other Ambulatory Visit: Payer: Self-pay | Admitting: Primary Care

## 2018-01-02 DIAGNOSIS — M62838 Other muscle spasm: Secondary | ICD-10-CM

## 2018-01-03 NOTE — Telephone Encounter (Signed)
Ok to refill? Electronically refill request for baclofen (LIORESAL) 10 MG tablet  Last prescribed on 11/09/2017. Last seen on 10/03/2017

## 2018-01-12 ENCOUNTER — Other Ambulatory Visit: Payer: Self-pay | Admitting: Primary Care

## 2018-01-12 DIAGNOSIS — M4714 Other spondylosis with myelopathy, thoracic region: Secondary | ICD-10-CM

## 2018-01-12 DIAGNOSIS — M4727 Other spondylosis with radiculopathy, lumbosacral region: Secondary | ICD-10-CM

## 2018-01-12 NOTE — Telephone Encounter (Signed)
Copied from [place] [phone]. Topic: Quick Communication - Rx Refill/Question >> Jan 12, 2018  4:03 PM Clack, Laban Emperor wrote: Medication: gabapentin (NEURONTIN) 300 MG capsule [[id number]]  Has the patient contacted their pharmacy? No. New rx. (Agent: If no, request that the patient contact the pharmacy for the refill.) Preferred Pharmacy (with phone number or street name): [facility] [place], [address] [phone] (Phone) [phone] (Fax)     Agent: Please be advised that RX refills may take up to 3 business days. We ask that you follow-up with your pharmacy.

## 2018-01-13 MED ORDER — GABAPENTIN 300 MG PO CAPS: 300.0000 mg | ORAL_CAPSULE | Freq: Three times a day (TID) | ORAL | 1 refills | Status: DC

## 2018-01-13 NOTE — Telephone Encounter (Signed)
Last refilled by Marlowe Shores PA-C.

## 2018-01-13 NOTE — Telephone Encounter (Signed)
Refill sent to pharmacy.   

## 2018-01-13 NOTE — Telephone Encounter (Signed)
Refill of Neurontin  Historical Provider  LOV 09/30/17 K. Clark  [facility] 09/16/17  #90  2 refills  [facility] [id number]

## 2018-01-24 ENCOUNTER — Ambulatory Visit: Payer: BLUE CROSS/BLUE SHIELD | Admitting: Primary Care

## 2018-01-24 ENCOUNTER — Encounter: Payer: Self-pay | Admitting: Primary Care

## 2018-01-24 VITALS — BP 134/72 | HR 64 | Temp 97.9°F | Ht 68.0 in | Wt 157.5 lb

## 2018-01-24 DIAGNOSIS — B379 Candidiasis, unspecified: Secondary | ICD-10-CM

## 2018-01-24 DIAGNOSIS — K592 Neurogenic bowel, not elsewhere classified: Secondary | ICD-10-CM

## 2018-01-24 DIAGNOSIS — M4727 Other spondylosis with radiculopathy, lumbosacral region: Secondary | ICD-10-CM | POA: Diagnosis not present

## 2018-01-24 DIAGNOSIS — I10 Essential (primary) hypertension: Secondary | ICD-10-CM | POA: Diagnosis not present

## 2018-01-24 DIAGNOSIS — N390 Urinary tract infection, site not specified: Secondary | ICD-10-CM

## 2018-01-24 DIAGNOSIS — T3695XA Adverse effect of unspecified systemic antibiotic, initial encounter: Secondary | ICD-10-CM

## 2018-01-24 DIAGNOSIS — E785 Hyperlipidemia, unspecified: Secondary | ICD-10-CM

## 2018-01-24 DIAGNOSIS — M4316 Spondylolisthesis, lumbar region: Secondary | ICD-10-CM | POA: Diagnosis not present

## 2018-01-24 DIAGNOSIS — G894 Chronic pain syndrome: Secondary | ICD-10-CM

## 2018-01-24 DIAGNOSIS — E119 Type 2 diabetes mellitus without complications: Secondary | ICD-10-CM

## 2018-01-24 LAB — COMPREHENSIVE METABOLIC PANEL
ALK PHOS: 116 U/L (ref 39–117)
ALT: 10 U/L (ref 0–35)
AST: 12 U/L (ref 0–37)
Albumin: 3.9 g/dL (ref 3.5–5.2)
BILIRUBIN TOTAL: 0.4 mg/dL (ref 0.2–1.2)
BUN: 12 mg/dL (ref 6–23)
CO2: 28 mEq/L (ref 19–32)
CREATININE: 0.67 mg/dL (ref 0.40–1.20)
Calcium: 9.5 mg/dL (ref 8.4–10.5)
Chloride: 106 mEq/L (ref 96–112)
GFR: 93.96 mL/min (ref >60.00)
Glucose, Bld: 121 mg/dL — ABNORMAL HIGH (ref 70–99)
POTASSIUM: 4.7 meq/L (ref 3.5–5.1)
SODIUM: 141 meq/L (ref 135–145)
TOTAL PROTEIN: 7.3 g/dL (ref 6.0–8.3)

## 2018-01-24 LAB — LIPID PANEL
CHOLESTEROL: 137 mg/dL (ref 0–200)
HDL: 48.3 mg/dL (ref >39.00)
LDL Cholesterol: 61 mg/dL (ref 0–99)
NonHDL: 89.17
Total CHOL/HDL Ratio: 3
Triglycerides: 143 mg/dL (ref 0.0–149.0)
VLDL: 28.6 mg/dL (ref 0.0–40.0)

## 2018-01-24 LAB — HEMOGLOBIN A1C: Hgb A1c MFr Bld: 6.5 % (ref 4.6–6.5)

## 2018-01-24 MED ORDER — FLUCONAZOLE 150 MG PO TABS: 150.0000 mg | ORAL_TABLET | Freq: Once | ORAL | 0 refills | Status: AC

## 2018-01-24 NOTE — Assessment & Plan Note (Signed)
Repeat A1C pending today. Foot and eye exam UTD.  Pneumonia vaccination UTD. Managed on ACE and ARB. Follow up in 6 months.

## 2018-01-24 NOTE — Progress Notes (Signed)
Subjective:    Patient ID: Megan [name], female    DOB: Feb 17, 1951, 67 y.o.   MRN: [id number]  HPI  Megan [name] is a 67 year old female who presents today for follow up.  1) Essential Hypertension: Currently managed on Lisinopril 5 mg. She denies chest pain, dizziness, shortness of breath.   BP Readings from Last 3 Encounters:  01/24/18 134/72  11/21/17 (!) 153/87  09/30/17 130/62   2) Type 2 Diabetes:   Current medications include: Metformin 500 mg BID.  She is checking her blood glucose once daily and is getting readings of: AM Fasting: Low 10's-120's. Highest reading: 2 hours after dinner 150's Lowest reading: 108  Last A1C: 6.0 in December 2018 Last Eye Exam: Due in August 2019 Last Foot Exam: Completed today Pneumonia Vaccination: Completed in 2016 ACE/ARB: Lisinopril  Statin: atorvastatin   3) Neurogenic Bladder: Stopped self catheterizing herself in February 2019. She is voiding on her own regularly throughout the day. She does have incontinence which was an issue prior to her surgery. She is now on Oxytrol Patch with improvement.   Treated for UTI 2 weeks ago, symptoms have resolved. She's since developed vaginal itching with yellow discharge. She denies hematuria, dysuria, pelvic pressure, fevers.   4) Chronic Back Pain/Lumbosacral spondylosis: Currently managed on baclofen, Celebrex, gabapentin, Tramadol. She is not having to use her Tramadol often. She is using her walker more frequently and ambulating, will be doing outpatient therapy soon. Overall she feels she's progressed significantly.      Review of Systems  Eyes: Negative for visual disturbance.  Respiratory: Negative for shortness of breath.   Cardiovascular: Negative for chest pain.  Musculoskeletal: Positive for back pain.  Neurological: Negative for dizziness and headaches.       Denies new numbness/weakness       Past Medical History:  Diagnosis Date  . Arthritis    "hands, back"  (07/26/2017)  . Dyspnea    W/ PHYS CONDITION   . GERD (gastroesophageal reflux disease)   . History of bronchitis    "not since I quit smoking" (07/26/2017)  . History of kidney stones   . History of shingles   . Hyperlipidemia    takes Fish Oil daily  . Hypertension   . Neuromuscular disorder (HCC)    tingling toes  . 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 Pneumonia 2009  . PONV (postoperative nausea and vomiting)   . Restless leg   . Type 2 diabetes mellitus ([place])   . Uterine cancer ([place]) 1979   S/P hysterectomy  . Weakness    numbness and tingling in both feet r/t back     Social History   Socioeconomic History  . Marital status: Married    Spouse name: Megan [name]  . Number of children: 1  . Years of education: 78  . Highest education level: Not on file  Occupational History  . Not on file  Social Needs  . Financial resource strain: Not on file  . Food insecurity:    Worry: Not on file    Inability: Not on file  . Transportation needs:    Medical: Not on file    Non-medical: Not on file  Tobacco Use  . Smoking status: Former Smoker    Packs/day: 1.50    Years: 37.00    Pack years: 55.50    Types: Cigarettes    Last attempt to quit: 05/13/2006    Years since quitting: 11.7  .  Smokeless tobacco: Never Used  Substance and Sexual Activity  . Alcohol use: Yes    Comment: 07/26/2017 "glass of wine once/wk if not on RX"  . Drug use: No  . Sexual activity: Not Currently    Birth control/protection: Surgical  Lifestyle  . Physical activity:    Days per week: Not on file    Minutes per session: Not on file  . Stress: Not on file  Relationships  . Social connections:    Talks on phone: Not on file    Gets together: Not on file    Attends religious service: Not on file    Active member of club or organization: Not on file    Attends meetings of clubs or organizations: Not on file    Relationship status: Not on file  . Intimate partner violence:     Fear of current or ex partner: Not on file    Emotionally abused: Not on file    Physically abused: Not on file    Forced sexual activity: Not on file  Other Topics Concern  . Not on file  Social History Narrative   Lives w/ husband   Married.   1 child, 1 grandchildren.   Retired. Once worked for [employer].   Enjoys reading.     Past Surgical History:  Procedure Laterality Date  . ABDOMINAL EXPOSURE N/A 07/26/2017   Procedure: ABDOMINAL EXPOSURE;  Surgeon: Angelia Mould, MD;  Location: [facility];  Service: Vascular;  Laterality: N/A;  . ANTERIOR CERVICAL DECOMP/DISCECTOMY FUSION  2001  . ANTERIOR LUMBAR FUSION N/A 07/26/2017   Procedure: Lumbar five-Sacral one Anterior lumbar interbody fusion with Dr. Deitra Mayo for approach;  Surgeon: Ditty, Kevan Ny, MD;  Location: [facility];  Service: Neurosurgery;  Laterality: N/A;  . APPLICATION OF ROBOTIC ASSISTANCE FOR SPINAL PROCEDURE  12/19/2016   Procedure: APPLICATION OF ROBOTIC ASSISTANCE FOR SPINAL PROCEDURE;  Surgeon: Kevan Ny Ditty, MD;  Location: [facility];  Service: Neurosurgery;;  . APPLICATION OF ROBOTIC ASSISTANCE FOR SPINAL PROCEDURE N/A 07/26/2017   Procedure: APPLICATION OF ROBOTIC ASSISTANCE FOR SPINAL PROCEDURE;  Surgeon: Ditty, Kevan Ny, MD;  Location: [facility];  Service: Neurosurgery;  Laterality: N/A;  . APPLICATION OF ROBOTIC ASSISTANCE FOR SPINAL PROCEDURE N/A 09/02/2017   Procedure: APPLICATION OF ROBOTIC ASSISTANCE FOR SPINAL PROCEDURE;  Surgeon: Ditty, Kevan Ny, MD;  Location: [facility];  Service: Neurosurgery;  Laterality: N/A;  . BACK SURGERY    . BLADDER SUSPENSION  1991   tack  . INGUINAL HERNIA REPAIR Right 1991  . LUMBAR FUSION  2016; 2017; 07/26/2017   L4-5; L2-3; L5-S1  . POSTERIOR LUMBAR FUSION 4 WITH HARDWARE REMOVAL N/A 09/02/2017   Procedure: Lumbar three-four redo laminectomy; Repositioning of Left Sacral two screw; Extension of lumbar fusion to Thoracic twelve;  Surgeon: Ditty,  Kevan Ny, MD;  Location: [facility];  Service: Neurosurgery;  Laterality: N/A;  . TONSILLECTOMY AND ADENOIDECTOMY  1959  . TUMOR EXCISION     WERTHIN'S TUMORS BOTH SIDES OF NECK  . VAGINAL HYSTERECTOMY  1979    Family History  Problem Relation Age of Onset  . Diabetes Mother   . Dementia Mother   . Cirrhosis Mother        Non alcoholic  . COPD Father   . Diabetes Brother     Allergies  Allergen Reactions  . Shellfish Allergy Anaphylaxis, Swelling and Other (See Comments)    Tongue swells  . Oxycodone Nausea Only  . Penicillins Rash and Other (  See Comments)    Has patient had a PCN reaction causing immediate rash, facial/tongue/throat swelling, SOB or lightheadedness with hypotension: Yes Has patient had a PCN reaction causing severe rash involving mucus membranes or skin necrosis: No Has patient had a PCN reaction that required hospitalization No Has patient had a PCN reaction occurring within the last 10 years: No If all of the above answers are "NO", then may proceed with Cephalosporin use.    Current Outpatient Medications on File Prior to Visit  Medication Sig Dispense Refill  . atorvastatin (LIPITOR) 20 MG tablet Take 1 tablet (20 mg total) by mouth every evening. 90 tablet 3  . baclofen (LIORESAL) 10 MG tablet TAKE 1 TABLET BY MOUTH 3 TO 4 TIMES DAILY AS NEEDED FOR SPASMS 180 tablet 0  . Biotin 5000 MCG CAPS Take 5,000 mcg by mouth every morning.     . celecoxib (CELEBREX) 200 MG capsule Take 1 capsule (200 mg total) by mouth 2 (two) times daily as needed for moderate pain. 180 capsule 0  . cholecalciferol (VITAMIN D) 1000 units tablet Take 2 tablets (2,000 Units total) by mouth daily after lunch. (Patient taking differently: Take 1,000 Units by mouth daily after lunch. ) 30 tablet 0  . Coenzyme Q10 (COQ10) 100 MG CAPS Take 100 mg daily by mouth.     . esomeprazole (NEXIUM 24HR) 20 MG capsule Take 1 capsule (20 mg total) by mouth daily at 12 noon. 90 capsule 1  . ferrous  sulfate 325 (65 FE) MG tablet Take 1 tablet (325 mg total) by mouth 2 (two) times daily with a meal. 60 tablet 3  . gabapentin (NEURONTIN) 300 MG capsule Take 1 capsule (300 mg total) by mouth 3 (three) times daily. 270 capsule 1  . Garlic 924 MG CAPS Take 500 mg 2 (two) times daily by mouth.    Marland Kitchen lisinopril (PRINIVIL,ZESTRIL) 5 MG tablet Take 1 tablet (5 mg total) by mouth daily. 30 tablet 0  . metFORMIN (GLUCOPHAGE) 500 MG tablet Take 1 tablet (500 mg total) by mouth 2 (two) times daily with a meal. 180 tablet 2  . oxybutynin (OXYTROL) 3.9 MG/24HR Place 1 patch onto the skin every 3 (three) days.    . polyethylene glycol (MIRALAX / GLYCOLAX) packet Take 17 g by mouth daily. (Patient taking differently: Take 17 g by mouth daily as needed for mild constipation. ) 14 each 0  . senna-docusate (SENOKOT-S) 8.6-50 MG tablet Take 2 tablets by mouth 2 (two) times daily. (Patient taking differently: Take 1-2 tablets by mouth daily as needed for mild constipation. )    . traMADol (ULTRAM) 50 MG tablet Take 1 tablet by mouth once daily as needed for severe pain. 30 tablet 0  . docusate sodium (COLACE) 100 MG capsule Take 1 capsule (100 mg total) by mouth 2 (two) times daily. (Patient not taking: Reported on 01/24/2018) 10 capsule 0   No current facility-administered medications on file prior to visit.     BP 134/72   Pulse 64   Temp 97.9 F (36.6 C) (Oral)   Ht 5\' 8"  (1.727 m)   Wt 157 lb 8 oz (71.4 kg)   SpO2 97%   BMI 23.95 kg/m    Objective:   Physical Exam  Constitutional: She appears well-nourished.  Neck: Neck supple.  Cardiovascular: Normal rate and regular rhythm.  Pulmonary/Chest: Effort normal and breath sounds normal.  Skin: Skin is warm and dry.  Psychiatric: She has a normal mood and affect.  Assessment & Plan:

## 2018-01-24 NOTE — Assessment & Plan Note (Signed)
No longer self catherizing and now voiding on her own.

## 2018-01-24 NOTE — Assessment & Plan Note (Signed)
Feels well managed on current regimen. Continue baclofen, celebrex, gabapentin. Use Tramadol sparingly. Will be starting outpatient therapy soon.

## 2018-01-24 NOTE — Assessment & Plan Note (Signed)
Stable in the office today, continue current regimen. Continue to monitor.

## 2018-01-24 NOTE — Assessment & Plan Note (Signed)
Repeat lipid panel pending. 

## 2018-01-24 NOTE — Patient Instructions (Addendum)
Stop by the lab prior to leaving today. I will notify you of your results once received.   Take the fluconazole tablet for yeast infection. Please notify me if no improvement in symptoms in 3-4 days.  Please schedule a physical with me in 6 months. You may also schedule a lab only appointment 3-4 days prior. We will discuss your lab results in detail during your physical.  It was a pleasure to see you today!

## 2018-01-24 NOTE — Assessment & Plan Note (Signed)
Treated with Cipro course 2 weeks ago, now with antibiotic induced yeast infection symptoms. Given recent antibiotic use, will treat with one dose of Diflucan.

## 2018-01-24 NOTE — Assessment & Plan Note (Signed)
Doing well on Celebrex, gabapentin, baclofen. Using Tramadol infrequently. Continue current regimen.

## 2018-03-07 ENCOUNTER — Telehealth: Payer: Self-pay | Admitting: Primary Care

## 2018-03-07 DIAGNOSIS — M4727 Other spondylosis with radiculopathy, lumbosacral region: Secondary | ICD-10-CM

## 2018-03-07 DIAGNOSIS — G894 Chronic pain syndrome: Secondary | ICD-10-CM

## 2018-03-07 NOTE — Telephone Encounter (Signed)
Copied from [place] [phone]. Topic: Quick Communication - Rx Refill/Question >> Mar 07, 2018  2:36 PM Waldemar Dickens, Sade R wrote: Medication: celecoxib (CELEBREX) 200 MG capsule  Has the patient contacted their pharmacy? Yes (Agent: If no, request that the patient contact the pharmacy for the refill.) (Agent: If yes, when and what did the pharmacy advise?)  Preferred Pharmacy (with phone number or street name): [facility] - [place], [address] [place] [phone] (Phone) [phone] (Fax)      Agent: Please be advised that RX refills may take up to 3 business days. We ask that you follow-up with your pharmacy.

## 2018-03-08 MED ORDER — CELECOXIB 200 MG PO CAPS: 200.0000 mg | ORAL_CAPSULE | Freq: Two times a day (BID) | ORAL | 0 refills | Status: DC | PRN

## 2018-03-08 NOTE — Telephone Encounter (Signed)
Refill request for Celebrex 200 MG capsule. LOV 01/24/18 with Allie Bossier, NP this medication was addressed.

## 2018-03-20 ENCOUNTER — Other Ambulatory Visit: Payer: Self-pay | Admitting: Primary Care

## 2018-03-20 DIAGNOSIS — M62838 Other muscle spasm: Secondary | ICD-10-CM

## 2018-03-20 NOTE — Telephone Encounter (Signed)
Electronically refill request   Last prescribed on 01/04/2018  Last office visit on 01/24/2018

## 2018-03-21 NOTE — Telephone Encounter (Signed)
Noted, refills sent to pharmacy. 

## 2018-04-17 DIAGNOSIS — M549 Dorsalgia, unspecified: Secondary | ICD-10-CM | POA: Diagnosis not present

## 2018-04-17 DIAGNOSIS — M4316 Spondylolisthesis, lumbar region: Secondary | ICD-10-CM | POA: Diagnosis not present

## 2018-04-17 DIAGNOSIS — M5416 Radiculopathy, lumbar region: Secondary | ICD-10-CM | POA: Diagnosis not present

## 2018-04-17 DIAGNOSIS — M5124 Other intervertebral disc displacement, thoracic region: Secondary | ICD-10-CM | POA: Diagnosis not present

## 2018-05-05 DIAGNOSIS — H524 Presbyopia: Secondary | ICD-10-CM | POA: Diagnosis not present

## 2018-05-05 DIAGNOSIS — E119 Type 2 diabetes mellitus without complications: Secondary | ICD-10-CM | POA: Diagnosis not present

## 2018-05-05 DIAGNOSIS — Z7984 Long term (current) use of oral hypoglycemic drugs: Secondary | ICD-10-CM | POA: Diagnosis not present

## 2018-05-05 DIAGNOSIS — H5202 Hypermetropia, left eye: Secondary | ICD-10-CM | POA: Diagnosis not present

## 2018-05-05 DIAGNOSIS — H52223 Regular astigmatism, bilateral: Secondary | ICD-10-CM | POA: Diagnosis not present

## 2018-05-05 LAB — HM DIABETES EYE EXAM

## 2018-05-09 ENCOUNTER — Encounter: Payer: Self-pay | Admitting: Primary Care

## 2018-05-11 DIAGNOSIS — G8222 Paraplegia, incomplete: Secondary | ICD-10-CM | POA: Diagnosis not present

## 2018-05-11 DIAGNOSIS — M4316 Spondylolisthesis, lumbar region: Secondary | ICD-10-CM | POA: Diagnosis not present

## 2018-05-11 DIAGNOSIS — G8220 Paraplegia, unspecified: Secondary | ICD-10-CM | POA: Diagnosis not present

## 2018-05-19 ENCOUNTER — Other Ambulatory Visit: Payer: Self-pay | Admitting: Primary Care

## 2018-05-19 DIAGNOSIS — M62838 Other muscle spasm: Secondary | ICD-10-CM

## 2018-05-20 ENCOUNTER — Other Ambulatory Visit: Payer: Self-pay | Admitting: Primary Care

## 2018-05-20 DIAGNOSIS — E119 Type 2 diabetes mellitus without complications: Secondary | ICD-10-CM

## 2018-05-23 ENCOUNTER — Other Ambulatory Visit: Payer: Self-pay

## 2018-05-23 ENCOUNTER — Ambulatory Visit: Payer: PPO | Attending: Physical Medicine & Rehabilitation | Admitting: Physical Therapy

## 2018-05-23 DIAGNOSIS — M6281 Muscle weakness (generalized): Secondary | ICD-10-CM

## 2018-05-23 DIAGNOSIS — R2689 Other abnormalities of gait and mobility: Secondary | ICD-10-CM | POA: Diagnosis not present

## 2018-05-23 DIAGNOSIS — R2681 Unsteadiness on feet: Secondary | ICD-10-CM | POA: Diagnosis not present

## 2018-05-23 NOTE — Therapy (Signed)
[facility] [address] [place], [place], [address] Phone: [phone]   Fax:  [phone]  Physical Therapy Evaluation  Patient Details  Name: Megan [name] MRN: [id number] Date of Birth: 05/25/1951 Referring Provider: Dr. Vertell Limber   Encounter Date: 05/23/2018  PT End of Session - 05/23/18 1641    Visit Number  1    Number of Visits  27    Date for PT Re-Evaluation  08/21/18    Authorization Type  Medicare    PT Start Time  5035    PT Stop Time  1622    PT Time Calculation (min)  47 min    Equipment Utilized During Treatment  Gait belt    Activity Tolerance  Patient tolerated treatment well    Behavior During Therapy  [facility] for tasks assessed/performed       Past Medical History:  Diagnosis Date  . Arthritis    "hands, back" (07/26/2017)  . Dyspnea    W/ PHYS CONDITION   . GERD (gastroesophageal reflux disease)   . History of bronchitis    "not since I quit smoking" (07/26/2017)  . History of kidney stones   . History of shingles   . Hyperlipidemia    takes Fish Oil daily  . Hypertension   . Neuromuscular disorder (HCC)    tingling toes  . 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[place]) 12/15/2016  . Pneumonia 2009  . PONV (postoperative nausea and vomiting)   . Restless leg   . Type 2 diabetes mellitus ([place])   . Uterine cancer ([place]) 1979   S/P hysterectomy  . Weakness    numbness and tingling in both feet r/t back    Past Surgical History:  Procedure Laterality Date  . ABDOMINAL EXPOSURE N/A 07/26/2017   Procedure: ABDOMINAL EXPOSURE;  Surgeon: Angelia Mould, MD;  Location: [facility];  Service: Vascular;  Laterality: N/A;  . ANTERIOR CERVICAL DECOMP/DISCECTOMY FUSION  2001  . ANTERIOR LUMBAR FUSION N/A 07/26/2017   Procedure: Lumbar five-Sacral one Anterior lumbar interbody fusion with Dr. Deitra Mayo for approach;  Surgeon: Ditty, Kevan Ny, MD;  Location: [facility];  Service: Neurosurgery;   Laterality: N/A;  . APPLICATION OF ROBOTIC ASSISTANCE FOR SPINAL PROCEDURE  12/19/2016   Procedure: APPLICATION OF ROBOTIC ASSISTANCE FOR SPINAL PROCEDURE;  Surgeon: Kevan Ny Ditty, MD;  Location: [facility];  Service: Neurosurgery;;  . APPLICATION OF ROBOTIC ASSISTANCE FOR SPINAL PROCEDURE N/A 07/26/2017   Procedure: APPLICATION OF ROBOTIC ASSISTANCE FOR SPINAL PROCEDURE;  Surgeon: Ditty, Kevan Ny, MD;  Location: [facility];  Service: Neurosurgery;  Laterality: N/A;  . APPLICATION OF ROBOTIC ASSISTANCE FOR SPINAL PROCEDURE N/A 09/02/2017   Procedure: APPLICATION OF ROBOTIC ASSISTANCE FOR SPINAL PROCEDURE;  Surgeon: Ditty, Kevan Ny, MD;  Location: [facility];  Service: Neurosurgery;  Laterality: N/A;  . BACK SURGERY    . BLADDER SUSPENSION  1991   tack  . INGUINAL HERNIA REPAIR Right 1991  . LUMBAR FUSION  2016; 2017; 07/26/2017   L4-5; L2-3; L5-S1  . POSTERIOR LUMBAR FUSION 4 WITH HARDWARE REMOVAL N/A 09/02/2017   Procedure: Lumbar three-four redo laminectomy; Repositioning of Left Sacral two screw; Extension of lumbar fusion to Thoracic twelve;  Surgeon: Ditty, Kevan Ny, MD;  Location: [facility];  Service: Neurosurgery;  Laterality: N/A;  . TONSILLECTOMY AND ADENOIDECTOMY  1959  . TUMOR EXCISION     WERTHIN'S TUMORS BOTH SIDES OF NECK  . VAGINAL HYSTERECTOMY  1979    There were no vitals  filed for this visit.   Subjective Assessment - 05/23/18 1537    Subjective  Reports spinal surgery last April 2018 - resutant R sided weakness/numbness; in November 2018 surgery due to "pinched" nerve with L sided LE pain. December 2018 - bowel blockage and "fractured" disc - had 3rd spinal surgery. Has had bouts of HHPT, OPPT. Reports significant weakness following surgeries with note knee buckling. Last HHPT bout Jan-Feb. Now desires to walk better - uses rollator on outdoor surfaces - back of knee and R groin begins to hurt limiting her. Feels like she can dress herself if lying on bed. No falls in  the past six months    Patient is accompained by:  Family member   husband   Pertinent History  HTN, DM2, left T10 costotransversectomy, T10-T11 laminectomy for decompression, pedicle screws, T9-T12 (4/18), L5-S1 anterior retroperitoneal exposure, L5-S1 laminectomy, L5-S1 posterior lateral fixation with fusion (11/18); chronic pain    Limitations  Standing;Walking    How long can you walk comfortably?  household distances, limited community ambulation with RW vs rollator    Patient Stated Goals  improve balance and transition to use of cane    Currently in Pain?  Yes    Pain Score  6     Pain Location  Leg    Pain Orientation  Right    Pain Descriptors / Indicators  Tingling    Pain Type  Chronic pain    Pain Onset  More than a month ago    Pain Frequency  Constant    Aggravating Factors   nothing    Pain Relieving Factors  medication controls tingling         [facility] PT Assessment - 05/23/18 1550      Assessment   Medical Diagnosis  Paraplegia    Referring Provider  Dr. Vertell Limber    Next MD Visit  07/19/18    Prior Therapy  yes      Precautions   Precautions  Fall      Restrictions   Weight Bearing Restrictions  No      Balance Screen   Has the patient fallen in the past 6 months  No    Has the patient had a decrease in activity level because of a fear of falling?   No    Is the patient reluctant to leave their home because of a fear of falling?   No      Home Environment   Living Environment  Private residence    Living Arrangements  Spouse/significant other    Type of [place] to enter;Ramped entrance    [place] of Steps  1    [place]  One level    [place] - 2 wheels;Walker - 4 wheels;Wheelchair - Press photographer;Shower seat;Grab bars - toilet;Grab bars - tub/shower;Toilet riser      Prior Function   Level of Independence  Independent with basic ADLs   husband will assist  with sock and shoe application   Vocation  Retired    Leisure  wants to get back to shopping independently      Cognition   Overall Cognitive Status  Within Functional Limits for tasks assessed      Sensation   Light Touch  Impaired Detail    Light Touch Impaired Details  Impaired RLE    Additional Comments  Reports tingling of entire  R LE - improved at distal LE - able to determine deepo pressure      Coordination   Gross Motor Movements are Fluid and Coordinated  No    Heel Shin Test  deficits related to reduced strength and spatial awareness of R LE      Posture/Postural Control   Posture/Postural Control  Postural limitations    Postural Limitations  Rounded Shoulders      ROM / Strength   AROM / PROM / Strength  Strength      Strength   Strength Assessment Site  Hip;Knee    Right/Left Hip  Right;Left    Right Hip Flexion  3+/5    Right Hip Extension  3-/5    Right Hip ABduction  3-/5    Left Hip Flexion  4/5    Left Hip Extension  3-/5    Left Hip ABduction  3+/5    Right/Left Knee  Right;Left    Right Knee Flexion  3-/5    Right Knee Extension  3+/5    Left Knee Flexion  4/5    Left Knee Extension  4/5      Palpation   Patella mobility  diffusely non-tender      Bed Mobility   Bed Mobility  Rolling Right;Rolling Left;Supine to Sit    Rolling Right  Supervision/verbal cueing   only for safety   Rolling Left  Supervision/Verbal cueing   only for safety   Supine to Sit  Minimal Assistance - Patient > 75%   to pull up from PT     Transfers   Transfers  Sit to Stand;Stand to Sit    Sit to Stand  5: Supervision    Sit to Stand Details  Verbal cues for precautions/safety    Stand to Sit  5: Supervision    Stand to Sit Details (indicate cue type and reason)  Verbal cues for precautions/safety      Ambulation/Gait   Ambulation/Gait  Yes    Ambulation/Gait Assistance  5: Supervision    Ambulation/Gait Assistance Details  amb with rollator - downward gaze  throughout    Ambulation Distance (Feet)  100 Feet    Assistive device  4-wheeled walker;Small based quad cane    Gait Pattern  Step-through pattern;Decreased stride length;Decreased stance time - right;Decreased step length - left;Decreased dorsiflexion - right;Decreased weight shift to right;Right genu recurvatum    Ambulation Surface  Level;Indoor    Gait velocity  1.8 ft/sec    Gait Comments  prefers ambulation with rollator/RW; tends to snap back into R knee hyperextension during stance phase of gait; difficulty maintaining balance with conversation other than gait      Standardized Balance Assessment   Standardized Balance Assessment  Timed Up and Go Test      Timed Up and Go Test   TUG  Normal TUG    Normal TUG (seconds)  --   rollator: 23.11 sec; SBQC: 1.07 min               Objective measurements completed on examination: See above findings.              PT Education - 05/23/18 1641    Education Details  exam findings, POC    Person(s) Educated  Patient;Spouse    Methods  Explanation    Comprehension  Verbalized understanding       PT Short Term Goals - 05/23/18 1647      PT SHORT TERM GOAL #1  Title  patient to be independent with initial HEP for strength, balance and gait.     Time  6    Period  Weeks    Status  New    Target Date  07/04/18      PT SHORT TERM GOAL #2   Title  Patient to demonstrate TUG with rollator to </= 16 seconds for reduced fall risk    Baseline  05/23/18 - 23.11 sec    Time  6    Period  Weeks    Status  New    Target Date  07/04/18      PT SHORT TERM GOAL #3   Title  Patient to demonstrate 400' gait on level surfaces with rollator at Mod I without LOB or overt instability    Time  6    Period  Weeks    Status  New    Target Date  07/04/18      PT SHORT TERM GOAL #4   Title  Patient to perform sit to stand x 10 with unilateral UE support with Mod I demonstrating improved strength and efficiency of transfers     Time  6    Period  Weeks    Status  New    Target Date  07/04/18        PT Long Term Goals - 05/23/18 1653      PT LONG TERM GOAL #1   Title  Patient to be independent with final HEP for balance, strength, gait and other functional mobility    Time  13    Period  Weeks    Status  New    Target Date  08/21/18      PT LONG TERM GOAL #2   Title  patient to demonstrate gait with LRAD over various levels and surfaces for >/= 500' with MOD I without LOB or overt instability    Time  13    Period  Weeks    Status  New    Target Date  08/21/18      PT LONG TERM GOAL #3   Title  Patient to improve TUG to </= 13 seconds with LRAD demonstrating reduced fall risk    Baseline  TUG rollator: 23.11 sec; TUG SBQC: 1.07 min    Time  13    Period  Weeks    Status  New    Target Date  08/21/18      PT LONG TERM GOAL #4   Title  Patient to improve gait velocity with LRAD to at least 77ft/sec for decreased fall risk    Baseline  05/23/18: 1.8 ft/sec with rollator    Time  13    Period  Weeks    Status  New    Target Date  08/21/18      PT LONG TERM GOAL #5   Title  Paitent to improve R LE strength by >/= 1 manual muscle grade demonstrating improved strength and functional use    Baseline  05/23/18: R LE grossly 3-/5    Time  13    Period  Weeks    Status  New    Target Date  08/21/18             Plan - 05/23/18 1642    Clinical Impression Statement  Mrs. Beever is a very pleasant 67 y/o female presenting to [facility] today regarding primary complaints of reduced balance and gait difficulty. Patient with extensive thoracic/lumbar surgeries with multiple level  stabilization as well as other co-morbidities. Patient ambulating today with rollator with noted gait deficits of R knee hyperextension as well as steppage gait due to reduced spatial awareness of LE. NOted reduced strength and sensation at R LE as well. Per gait velocity and TUG scores, patient at a higher risk for falls. Assessed  gait with [facility] today with patient demonstrating difficulty with step to pattern (3 point gait) as well as difficulty dividing attention between gait and conversation. Patient to benefit from skilled PT intervention to address deficits in strength, balance, functional mobility, gait and safety.     History and Personal Factors relevant to plan of care:  HTN, DM2, left T10 costotransversectomy, T10-T11 laminectomy for decompression, pedicle screws, T9-T12 (4/18), L5-S1 anterior retroperitoneal exposure, L5-S1 laminectomy, L5-S1 posterior lateral fixation with fusion (11/18); chronic pain    Clinical Presentation  Evolving    Clinical Presentation due to:  see above    Clinical Decision Making  Moderate    Rehab Potential  Good    PT Frequency  2x / week    PT Duration  Other (comment)   13 weeks (90 days)   PT Treatment/Interventions  ADLs/Self Care Home Management;Cryotherapy;Electrical Stimulation;DME Instruction;Moist Heat;Gait training;Stair training;Functional mobility training;Therapeutic activities;Therapeutic exercise;Balance training;Patient/family education;Neuromuscular re-education;Manual techniques;Taping;Passive range of motion    PT Next Visit Plan  initiate HEP    Consulted and Agree with Plan of Care  Patient       Patient will benefit from skilled therapeutic intervention in order to improve the following deficits and impairments:  Abnormal gait, Decreased activity tolerance, Decreased balance, Decreased safety awareness, Difficulty walking, Decreased strength, Decreased mobility, Decreased coordination, Pain, Impaired sensation  Visit Diagnosis: Unsteadiness on feet  Other abnormalities of gait and mobility  Muscle weakness (generalized)     Problem List Patient Active Problem List   Diagnosis Date Noted  . Debility   . Anemia of chronic disease   . Chronic pain syndrome   . Hypokalemia   . Acute lower UTI 09/04/2017  . L3 vertebral fracture ([place]) 09/01/2017  .  Constipation due to pain medication   . Muscle spasms of both lower extremities   . Type 2 diabetes mellitus with peripheral neuropathy (HCC)   . Hypoalbuminemia due to protein-calorie malnutrition ([place])   . Radiculopathy 07/28/2017  . Lumbosacral spondylosis with radiculopathy 07/26/2017  . Paraplegia, incomplete ([place]) 01/07/2017  . 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/27/2016  . Myelopathy ([place]) 12/21/2016  . Neuropathic pain   . 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 Paraparesis ([place]) 12/15/2016  . Type 2 diabetes mellitus without complication, without long-term current use of insulin ([place]) 11/02/2016  . Hyperlipidemia 11/02/2016  . Essential hypertension 11/02/2016  . 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    Lanney Gins, PT, DPT 05/23/18 5:00 PM Pager: [phone]  [facility] [address] [place], [place], [address] Phone: [phone]   Fax:  [phone]  Name: Megan [name] MRN: [id number] Date of Birth: 01-12-1951

## 2018-05-26 ENCOUNTER — Ambulatory Visit: Payer: PPO | Admitting: Physical Therapy

## 2018-05-26 ENCOUNTER — Encounter: Payer: Self-pay | Admitting: Physical Therapy

## 2018-05-26 DIAGNOSIS — R2681 Unsteadiness on feet: Secondary | ICD-10-CM | POA: Diagnosis not present

## 2018-05-26 DIAGNOSIS — M6281 Muscle weakness (generalized): Secondary | ICD-10-CM

## 2018-05-26 DIAGNOSIS — R2689 Other abnormalities of gait and mobility: Secondary | ICD-10-CM

## 2018-05-26 NOTE — Therapy (Signed)
[facility] [address] [place], [place], [address] Phone: [phone]   Fax:  [phone]  Physical Therapy Treatment  Patient Details  Name: Megan [name] MRN: [id number] Date of Birth: May 16, 1951 Referring Provider: Dr. Vertell Limber   Encounter Date: 05/26/2018  PT End of Session - 05/26/18 1533    Visit Number  2    Number of Visits  27    Date for PT Re-Evaluation  08/21/18    Authorization Type  Medicare    PT Start Time  1530    PT Stop Time  1612    PT Time Calculation (min)  42 min    Activity Tolerance  Patient tolerated treatment well    Behavior During Therapy  [facility] for tasks assessed/performed       Past Medical History:  Diagnosis Date  . Arthritis    "hands, back" (07/26/2017)  . Dyspnea    W/ PHYS CONDITION   . GERD (gastroesophageal reflux disease)   . History of bronchitis    "not since I quit smoking" (07/26/2017)  . History of kidney stones   . History of shingles   . Hyperlipidemia    takes Fish Oil daily  . Hypertension   . Neuromuscular disorder (HCC)    tingling toes  . 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[place]) 12/15/2016  . Pneumonia 2009  . PONV (postoperative nausea and vomiting)   . Restless leg   . Type 2 diabetes mellitus ([place])   . Uterine cancer ([place]) 1979   S/P hysterectomy  . Weakness    numbness and tingling in both feet r/t back    Past Surgical History:  Procedure Laterality Date  . ABDOMINAL EXPOSURE N/A 07/26/2017   Procedure: ABDOMINAL EXPOSURE;  Surgeon: Angelia Mould, MD;  Location: [facility];  Service: Vascular;  Laterality: N/A;  . ANTERIOR CERVICAL DECOMP/DISCECTOMY FUSION  2001  . ANTERIOR LUMBAR FUSION N/A 07/26/2017   Procedure: Lumbar five-Sacral one Anterior lumbar interbody fusion with Dr. Deitra Mayo for approach;  Surgeon: Ditty, Kevan Ny, MD;  Location: [facility];  Service: Neurosurgery;  Laterality: N/A;  . APPLICATION OF ROBOTIC  ASSISTANCE FOR SPINAL PROCEDURE  12/19/2016   Procedure: APPLICATION OF ROBOTIC ASSISTANCE FOR SPINAL PROCEDURE;  Surgeon: Kevan Ny Ditty, MD;  Location: [facility];  Service: Neurosurgery;;  . APPLICATION OF ROBOTIC ASSISTANCE FOR SPINAL PROCEDURE N/A 07/26/2017   Procedure: APPLICATION OF ROBOTIC ASSISTANCE FOR SPINAL PROCEDURE;  Surgeon: Ditty, Kevan Ny, MD;  Location: [facility];  Service: Neurosurgery;  Laterality: N/A;  . APPLICATION OF ROBOTIC ASSISTANCE FOR SPINAL PROCEDURE N/A 09/02/2017   Procedure: APPLICATION OF ROBOTIC ASSISTANCE FOR SPINAL PROCEDURE;  Surgeon: Ditty, Kevan Ny, MD;  Location: [facility];  Service: Neurosurgery;  Laterality: N/A;  . BACK SURGERY    . BLADDER SUSPENSION  1991   tack  . INGUINAL HERNIA REPAIR Right 1991  . LUMBAR FUSION  2016; 2017; 07/26/2017   L4-5; L2-3; L5-S1  . POSTERIOR LUMBAR FUSION 4 WITH HARDWARE REMOVAL N/A 09/02/2017   Procedure: Lumbar three-four redo laminectomy; Repositioning of Left Sacral two screw; Extension of lumbar fusion to Thoracic twelve;  Surgeon: Ditty, Kevan Ny, MD;  Location: [facility];  Service: Neurosurgery;  Laterality: N/A;  . TONSILLECTOMY AND ADENOIDECTOMY  1959  . TUMOR EXCISION     WERTHIN'S TUMORS BOTH SIDES OF NECK  . VAGINAL HYSTERECTOMY  1979    There were no vitals filed for this visit.  Subjective Assessment - 05/26/18 1532  Subjective  having some pain since last session - feels like it was from lying prone    Patient is accompained by:  Family member   husband   Pertinent History  HTN, DM2, left T10 costotransversectomy, T10-T11 laminectomy for decompression, pedicle screws, T9-T12 (4/18), L5-S1 anterior retroperitoneal exposure, L5-S1 laminectomy, L5-S1 posterior lateral fixation with fusion (11/18); chronic pain    Patient Stated Goals  improve balance and transition to use of cane    Currently in Pain?  Yes    Pain Score  6     Pain Location  Back    Pain Orientation  Right;Left;Mid;Lower     Pain Descriptors / Indicators  Aching;Discomfort;Sore    Pain Type  Chronic pain                       [facility] Adult PT Treatment/Exercise - 05/26/18 0001      Ambulation/Gait   Ambulation/Gait  Yes    Ambulation/Gait Assistance  4: Min guard    Ambulation/Gait Assistance Details  amb with rollator focusing on reduced R knee hyperextension    Ambulation Distance (Feet)  100 Feet    Assistive device  4-wheeled walker    Gait Pattern  Step-through pattern;Decreased stride length;Decreased stance time - right;Decreased step length - left;Decreased dorsiflexion - right;Decreased weight shift to right;Right genu recurvatum    Ambulation Surface  Level;Indoor          Balance Exercises - 05/26/18 1543      Balance Exercises: Standing   Standing Eyes Opened  Narrow base of support (BOS);Solid surface;3 reps;10 secs    Standing Eyes Closed  Narrow base of support (BOS);Solid surface;4 reps;10 secs    Tandem Stance  Eyes open;3 reps;15 secs    Sidestepping  2 reps;Upper extremity support   B UE support; 2 laps in // bars   Marching Limitations  high knee marching in // bars x 4    Sit to Stand Time  from mat with 2 AirEx    Other Standing Exercises  R toe taps to 4" step - difficulty with forward excursion - tends to circumduct          PT Short Term Goals - 05/23/18 1647      PT SHORT TERM GOAL #1   Title  patient to be independent with initial HEP for strength, balance and gait.     Time  6    Period  Weeks    Status  New    Target Date  07/04/18      PT SHORT TERM GOAL #2   Title  Patient to demonstrate TUG with rollator to </= 16 seconds for reduced fall risk    Baseline  05/23/18 - 23.11 sec    Time  6    Period  Weeks    Status  New    Target Date  07/04/18      PT SHORT TERM GOAL #3   Title  Patient to demonstrate 400' gait on level surfaces with rollator at Mod I without LOB or overt instability    Time  6    Period  Weeks    Status  New     Target Date  07/04/18      PT SHORT TERM GOAL #4   Title  Patient to perform sit to stand x 10 with unilateral UE support with Mod I demonstrating improved strength and efficiency of transfers    Time  6  Period  Weeks    Status  New    Target Date  07/04/18        PT Long Term Goals - 05/23/18 1653      PT LONG TERM GOAL #1   Title  Patient to be independent with final HEP for balance, strength, gait and other functional mobility    Time  13    Period  Weeks    Status  New    Target Date  08/21/18      PT LONG TERM GOAL #2   Title  patient to demonstrate gait with LRAD over various levels and surfaces for >/= 500' with MOD I without LOB or overt instability    Time  13    Period  Weeks    Status  New    Target Date  08/21/18      PT LONG TERM GOAL #3   Title  Patient to improve TUG to </= 13 seconds with LRAD demonstrating reduced fall risk    Baseline  TUG rollator: 23.11 sec; TUG SBQC: 1.07 min    Time  13    Period  Weeks    Status  New    Target Date  08/21/18      PT LONG TERM GOAL #4   Title  Patient to improve gait velocity with LRAD to at least 6ft/sec for decreased fall risk    Baseline  05/23/18: 1.8 ft/sec with rollator    Time  13    Period  Weeks    Status  New    Target Date  08/21/18      PT LONG TERM GOAL #5   Title  Paitent to improve R LE strength by >/= 1 manual muscle grade demonstrating improved strength and functional use    Baseline  05/23/18: R LE grossly 3-/5    Time  13    Period  Weeks    Status  New    Target Date  08/21/18            Plan - 05/26/18 1623    Clinical Impression Statement  Patient reporting moderate low back pain today but motivated to participate with PT. PT session today focusing on HEP initiation towards R LE strengthening, balance, and gait. Paitent continues to prefer R knee hyperextension in gait with verbal cueing to reduce with limited carryover due to weakness. Will continue to benefit from skilled PT to  progress towards goals.     Rehab Potential  Good    PT Frequency  2x / week    PT Duration  Other (comment)   13 weeks (90 days)   PT Treatment/Interventions  ADLs/Self Care Home Management;Cryotherapy;Electrical Stimulation;DME Instruction;Moist Heat;Gait training;Stair training;Functional mobility training;Therapeutic activities;Therapeutic exercise;Balance training;Patient/family education;Neuromuscular re-education;Manual techniques;Taping;Passive range of motion    PT Next Visit Plan  continue to progress gait and balance, strengthening of R LE    Consulted and Agree with Plan of Care  Patient       Patient will benefit from skilled therapeutic intervention in order to improve the following deficits and impairments:  Abnormal gait, Decreased activity tolerance, Decreased balance, Decreased safety awareness, Difficulty walking, Decreased strength, Decreased mobility, Decreased coordination, Pain, Impaired sensation  Visit Diagnosis: Unsteadiness on feet  Other abnormalities of gait and mobility  Muscle weakness (generalized)     Problem List Patient Active Problem List   Diagnosis Date Noted  . Debility   . Anemia of chronic disease   . Chronic pain  syndrome   . Hypokalemia   . Acute lower UTI 09/04/2017  . L3 vertebral fracture ([place]) 09/01/2017  . Constipation due to pain medication   . Muscle spasms of both lower extremities   . Type 2 diabetes mellitus with peripheral neuropathy (HCC)   . Hypoalbuminemia due to protein-calorie malnutrition ([place])   . Radiculopathy 07/28/2017  . Lumbosacral spondylosis with radiculopathy 07/26/2017  . Paraplegia, incomplete ([place]) 01/07/2017  . 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/27/2016  . Myelopathy ([place]) 12/21/2016  . Neuropathic pain   . 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 Paraparesis ([place]) 12/15/2016  . Type 2 diabetes mellitus without complication, without long-term current use of insulin ([place]) 11/02/2016  .  Hyperlipidemia 11/02/2016  . Essential hypertension 11/02/2016  . 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     Lanney Gins, PT, DPT 05/26/18 4:42 PM Pager: [phone]   [facility] [address] [place], [place], [address] Phone: [phone]   Fax:  [phone]  Name: MARIKAY ROADS MRN: [id number] Date of Birth: 1951/06/18

## 2018-05-31 ENCOUNTER — Encounter: Payer: Self-pay | Admitting: Physical Therapy

## 2018-05-31 ENCOUNTER — Ambulatory Visit: Payer: PPO | Admitting: Physical Therapy

## 2018-05-31 DIAGNOSIS — R2681 Unsteadiness on feet: Secondary | ICD-10-CM | POA: Diagnosis not present

## 2018-05-31 DIAGNOSIS — M6281 Muscle weakness (generalized): Secondary | ICD-10-CM

## 2018-05-31 DIAGNOSIS — R2689 Other abnormalities of gait and mobility: Secondary | ICD-10-CM

## 2018-05-31 NOTE — Therapy (Signed)
[facility] [address] [place], [place], [address] Phone: [phone]   Fax:  [phone]  Physical Therapy Treatment  Patient Details  Name: Megan [name] MRN: [id number] Date of Birth: [birth year] Referring Provider: Dr. Vertell Limber   Encounter Date: 05/31/2018  PT End of Session - 05/31/18 0937    Visit Number  3    Number of Visits  27    Date for PT Re-Evaluation  08/21/18    Authorization Type  Medicare    PT Start Time  0933    PT Stop Time  1014    PT Time Calculation (min)  41 min    Activity Tolerance  Patient tolerated treatment well    Behavior During Therapy  [facility] for tasks assessed/performed       Past Medical History:  Diagnosis Date  . Arthritis    "hands, back" (07/26/2017)  . Dyspnea    W/ PHYS CONDITION   . GERD (gastroesophageal reflux disease)   . History of bronchitis    "not since I quit smoking" (07/26/2017)  . History of kidney stones   . History of shingles   . Hyperlipidemia    takes Fish Oil daily  . Hypertension   . Neuromuscular disorder (HCC)    tingling toes  . 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[place]) 12/15/2016  . Pneumonia 2009  . PONV (postoperative nausea and vomiting)   . Restless leg   . Type 2 diabetes mellitus ([place])   . Uterine cancer ([place]) 1979   S/P hysterectomy  . Weakness    numbness and tingling in both feet r/t back    Past Surgical History:  Procedure Laterality Date  . ABDOMINAL EXPOSURE N/A 07/26/2017   Procedure: ABDOMINAL EXPOSURE;  Surgeon: Angelia Mould, MD;  Location: [facility];  Service: Vascular;  Laterality: N/A;  . ANTERIOR CERVICAL DECOMP/DISCECTOMY FUSION  2001  . ANTERIOR LUMBAR FUSION N/A 07/26/2017   Procedure: Lumbar five-Sacral one Anterior lumbar interbody fusion with Dr. Deitra Mayo for approach;  Surgeon: Ditty, Kevan Ny, MD;  Location: [facility];  Service: Neurosurgery;  Laterality: N/A;  . APPLICATION OF ROBOTIC  ASSISTANCE FOR SPINAL PROCEDURE  12/19/2016   Procedure: APPLICATION OF ROBOTIC ASSISTANCE FOR SPINAL PROCEDURE;  Surgeon: Kevan Ny Ditty, MD;  Location: [facility];  Service: Neurosurgery;;  . APPLICATION OF ROBOTIC ASSISTANCE FOR SPINAL PROCEDURE N/A 07/26/2017   Procedure: APPLICATION OF ROBOTIC ASSISTANCE FOR SPINAL PROCEDURE;  Surgeon: Ditty, Kevan Ny, MD;  Location: [facility];  Service: Neurosurgery;  Laterality: N/A;  . APPLICATION OF ROBOTIC ASSISTANCE FOR SPINAL PROCEDURE N/A 09/02/2017   Procedure: APPLICATION OF ROBOTIC ASSISTANCE FOR SPINAL PROCEDURE;  Surgeon: Ditty, Kevan Ny, MD;  Location: [facility];  Service: Neurosurgery;  Laterality: N/A;  . BACK SURGERY    . BLADDER SUSPENSION  1991   tack  . INGUINAL HERNIA REPAIR Right 1991  . LUMBAR FUSION  2016; 2017; 07/26/2017   L4-5; L2-3; L5-S1  . POSTERIOR LUMBAR FUSION 4 WITH HARDWARE REMOVAL N/A 09/02/2017   Procedure: Lumbar three-four redo laminectomy; Repositioning of Left Sacral two screw; Extension of lumbar fusion to Thoracic twelve;  Surgeon: Ditty, Kevan Ny, MD;  Location: [facility];  Service: Neurosurgery;  Laterality: N/A;  . TONSILLECTOMY AND ADENOIDECTOMY  1959  . TUMOR EXCISION     WERTHIN'S TUMORS BOTH SIDES OF NECK  . VAGINAL HYSTERECTOMY  1979    There were no vitals filed for this visit.  Subjective Assessment - 05/31/18 0935  Subjective  has been doing HEP - still most difficulty with sit to stand; no new complaints    Patient is accompained by:  Family member   daughter   Pertinent History  HTN, DM2, left T10 costotransversectomy, T10-T11 laminectomy for decompression, pedicle screws, T9-T12 (4/18), L5-S1 anterior retroperitoneal exposure, L5-S1 laminectomy, L5-S1 posterior lateral fixation with fusion (11/18); chronic pain    Limitations  Standing;Walking    How long can you walk comfortably?  household distances, limited community ambulation with RW vs rollator    Patient Stated Goals  improve  balance and transition to use of cane    Currently in Pain?  No/denies    Pain Score  0-No pain                       [facility] Adult PT Treatment/Exercise - 05/31/18 0001      Ambulation/Gait   Ambulation/Gait  Yes    Ambulation/Gait Assistance  4: Min guard    Ambulation/Gait Assistance Details  ambulating with rollator - today focusing on gait with head turns and improving surrounding awareness rather than preferred downward gaze    Ambulation Distance (Feet)  --   2 lap around gym   Assistive device  4-wheeled walker    Gait Pattern  Step-through pattern;Decreased stride length;Decreased stance time - right;Decreased step length - left;Decreased dorsiflexion - right;Decreased weight shift to right;Right genu recurvatum    Ambulation Surface  Level;Indoor    Gait Comments  need to practice up/down ramps with rollator at next visit as patient is uncomfortable with this in the community      Exercises   Exercises  Other Exercises;Knee/Hip    Other Exercises   seated scap retraction x 12 reps      Knee/Hip Exercises: Standing   Hip Flexion  Right;15 reps;Knee straight    Hip Abduction  Right;15 reps;Knee straight    Hip Extension  Right;15 reps;Knee straight          Balance Exercises - 05/31/18 0939      Balance Exercises: Standing   Tandem Stance  Eyes open;4 reps;20 secs   wiht R LE posterior increased difficulty   Standing, One Foot on a Step  Eyes open;4 inch;3 reps;20 secs   increased difficulty with R LE weight bearing   Step Ups  Forward;4 inch;UE support 2   wokring on reducing R knee hyperextension   Other Standing Exercises  alternating R toe taps to 4" step - difficulty with forward excursion - tends to circumduct - improved with slowed movements        PT Education - 05/31/18 1119    Education Details  education on upright posturing to reduced stress/strain on upper/lower back as well as for improved functional mobility;     Person(s) Educated   Patient    Methods  Explanation    Comprehension  Verbalized understanding       PT Short Term Goals - 05/23/18 1647      PT SHORT TERM GOAL #1   Title  patient to be independent with initial HEP for strength, balance and gait.     Time  6    Period  Weeks    Status  New    Target Date  07/04/18      PT SHORT TERM GOAL #2   Title  Patient to demonstrate TUG with rollator to </= 16 seconds for reduced fall risk    Baseline  05/23/18 - 23.11 sec  Time  6    Period  Weeks    Status  New    Target Date  07/04/18      PT SHORT TERM GOAL #3   Title  Patient to demonstrate 400' gait on level surfaces with rollator at Mod I without LOB or overt instability    Time  6    Period  Weeks    Status  New    Target Date  07/04/18      PT SHORT TERM GOAL #4   Title  Patient to perform sit to stand x 10 with unilateral UE support with Mod I demonstrating improved strength and efficiency of transfers    Time  6    Period  Weeks    Status  New    Target Date  07/04/18        PT Long Term Goals - 05/23/18 1653      PT LONG TERM GOAL #1   Title  Patient to be independent with final HEP for balance, strength, gait and other functional mobility    Time  13    Period  Weeks    Status  New    Target Date  08/21/18      PT LONG TERM GOAL #2   Title  patient to demonstrate gait with LRAD over various levels and surfaces for >/= 500' with MOD I without LOB or overt instability    Time  13    Period  Weeks    Status  New    Target Date  08/21/18      PT LONG TERM GOAL #3   Title  Patient to improve TUG to </= 13 seconds with LRAD demonstrating reduced fall risk    Baseline  TUG rollator: 23.11 sec; TUG SBQC: 1.07 min    Time  13    Period  Weeks    Status  New    Target Date  08/21/18      PT LONG TERM GOAL #4   Title  Patient to improve gait velocity with LRAD to at least 38ft/sec for decreased fall risk    Baseline  05/23/18: 1.8 ft/sec with rollator    Time  13    Period   Weeks    Status  New    Target Date  08/21/18      PT LONG TERM GOAL #5   Title  Paitent to improve R LE strength by >/= 1 manual muscle grade demonstrating improved strength and functional use    Baseline  05/23/18: R LE grossly 3-/5    Time  13    Period  Weeks    Status  New    Target Date  08/21/18            Plan - 05/31/18 8315    Clinical Impression Statement  Patient reporting good compliance with HEP thus far - has found a tall chair to perform sit to stand to encourage reduced use of UE. PT sessions continuing to focus on strengthening and balance with todays session incorpating focus on posturing as patient prefers a flexed posture with all mobility. Patient much more aware of R Knee hyperextension with stepping and gait with patient making good conscious effort to reduce. Most difficulty today with narrow BOS requiring increased R LE weight bearing. WIll plan to work on ramps/outdoor surfaces at next session wtih rollator as patient reports discomfort with this.    Rehab Potential  Good    PT  Frequency  2x / week    PT Duration  Other (comment)   13 weeks (90 days)   PT Treatment/Interventions  ADLs/Self Care Home Management;Cryotherapy;Electrical Stimulation;DME Instruction;Moist Heat;Gait training;Stair training;Functional mobility training;Therapeutic activities;Therapeutic exercise;Balance training;Patient/family education;Neuromuscular re-education;Manual techniques;Taping;Passive range of motion    PT Next Visit Plan  continue to progress gait and balance, strengthening of R LE    Consulted and Agree with Plan of Care  Patient       Patient will benefit from skilled therapeutic intervention in order to improve the following deficits and impairments:  Abnormal gait, Decreased activity tolerance, Decreased balance, Decreased safety awareness, Difficulty walking, Decreased strength, Decreased mobility, Decreased coordination, Pain, Impaired sensation  Visit  Diagnosis: Unsteadiness on feet  Other abnormalities of gait and mobility  Muscle weakness (generalized)     Problem List Patient Active Problem List   Diagnosis Date Noted  . Debility   . Anemia of chronic disease   . Chronic pain syndrome   . Hypokalemia   . Acute lower UTI 09/04/2017  . L3 vertebral fracture ([place]) 09/01/2017  . Constipation due to pain medication   . Muscle spasms of both lower extremities   . Type 2 diabetes mellitus with peripheral neuropathy (HCC)   . Hypoalbuminemia due to protein-calorie malnutrition ([place])   . Radiculopathy 07/28/2017  . Lumbosacral spondylosis with radiculopathy 07/26/2017  . Paraplegia, incomplete ([place]) 01/07/2017  . 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/27/2016  . Myelopathy ([place]) 12/21/2016  . Neuropathic pain   . 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 Paraparesis ([place]) 12/15/2016  . Type 2 diabetes mellitus without complication, without long-term current use of insulin ([place]) 11/02/2016  . Hyperlipidemia 11/02/2016  . Essential hypertension 11/02/2016  . 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    Lanney Gins, PT, DPT Supplemental Physical Therapist 05/31/18 11:24 AM Pager: [phone] Office: [facility] [address] [place], [place], [address] Phone: [phone]   Fax:  [phone]  Name: Megan [name] MRN: [id number] Date of Birth: 1951-04-27

## 2018-06-02 ENCOUNTER — Ambulatory Visit: Payer: PPO | Admitting: Physical Therapy

## 2018-06-02 ENCOUNTER — Encounter: Payer: Self-pay | Admitting: Physical Therapy

## 2018-06-02 DIAGNOSIS — R2681 Unsteadiness on feet: Secondary | ICD-10-CM | POA: Diagnosis not present

## 2018-06-02 DIAGNOSIS — R2689 Other abnormalities of gait and mobility: Secondary | ICD-10-CM

## 2018-06-02 DIAGNOSIS — M6281 Muscle weakness (generalized): Secondary | ICD-10-CM

## 2018-06-02 NOTE — Therapy (Signed)
[facility] [address] [place], [place], [address] Phone: [phone]   Fax:  [phone]  Physical Therapy Treatment  Patient Details  Name: Megan [name] MRN: [id number] Date of Birth: [birth year] Referring Provider: Dr. Vertell Limber   Encounter Date: 06/02/2018  PT End of Session - 06/02/18 1110    Visit Number  4    Number of Visits  27    Date for PT Re-Evaluation  08/21/18    Authorization Type  Medicare    PT Start Time  1105    PT Stop Time  1145    PT Time Calculation (min)  40 min    Activity Tolerance  Patient tolerated treatment well    Behavior During Therapy  [facility] for tasks assessed/performed       Past Medical History:  Diagnosis Date  . Arthritis    "hands, back" (07/26/2017)  . Dyspnea    W/ PHYS CONDITION   . GERD (gastroesophageal reflux disease)   . History of bronchitis    "not since I quit smoking" (07/26/2017)  . History of kidney stones   . History of shingles   . Hyperlipidemia    takes Fish Oil daily  . Hypertension   . Neuromuscular disorder (HCC)    tingling toes  . 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[place]) 12/15/2016  . Pneumonia 2009  . PONV (postoperative nausea and vomiting)   . Restless leg   . Type 2 diabetes mellitus ([place])   . Uterine cancer ([place]) 1979   S/P hysterectomy  . Weakness    numbness and tingling in both feet r/t back    Past Surgical History:  Procedure Laterality Date  . ABDOMINAL EXPOSURE N/A 07/26/2017   Procedure: ABDOMINAL EXPOSURE;  Surgeon: Angelia Mould, MD;  Location: [facility];  Service: Vascular;  Laterality: N/A;  . ANTERIOR CERVICAL DECOMP/DISCECTOMY FUSION  2001  . ANTERIOR LUMBAR FUSION N/A 07/26/2017   Procedure: Lumbar five-Sacral one Anterior lumbar interbody fusion with Dr. Deitra Mayo for approach;  Surgeon: Ditty, Kevan Ny, MD;  Location: [facility];  Service: Neurosurgery;  Laterality: N/A;  . APPLICATION OF ROBOTIC  ASSISTANCE FOR SPINAL PROCEDURE  12/19/2016   Procedure: APPLICATION OF ROBOTIC ASSISTANCE FOR SPINAL PROCEDURE;  Surgeon: Kevan Ny Ditty, MD;  Location: [facility];  Service: Neurosurgery;;  . APPLICATION OF ROBOTIC ASSISTANCE FOR SPINAL PROCEDURE N/A 07/26/2017   Procedure: APPLICATION OF ROBOTIC ASSISTANCE FOR SPINAL PROCEDURE;  Surgeon: Ditty, Kevan Ny, MD;  Location: [facility];  Service: Neurosurgery;  Laterality: N/A;  . APPLICATION OF ROBOTIC ASSISTANCE FOR SPINAL PROCEDURE N/A 09/02/2017   Procedure: APPLICATION OF ROBOTIC ASSISTANCE FOR SPINAL PROCEDURE;  Surgeon: Ditty, Kevan Ny, MD;  Location: [facility];  Service: Neurosurgery;  Laterality: N/A;  . BACK SURGERY    . BLADDER SUSPENSION  1991   tack  . INGUINAL HERNIA REPAIR Right 1991  . LUMBAR FUSION  2016; 2017; 07/26/2017   L4-5; L2-3; L5-S1  . POSTERIOR LUMBAR FUSION 4 WITH HARDWARE REMOVAL N/A 09/02/2017   Procedure: Lumbar three-four redo laminectomy; Repositioning of Left Sacral two screw; Extension of lumbar fusion to Thoracic twelve;  Surgeon: Ditty, Kevan Ny, MD;  Location: [facility];  Service: Neurosurgery;  Laterality: N/A;  . TONSILLECTOMY AND ADENOIDECTOMY  1959  . TUMOR EXCISION     WERTHIN'S TUMORS BOTH SIDES OF NECK  . VAGINAL HYSTERECTOMY  1979    There were no vitals filed for this visit.  Subjective Assessment - 06/02/18 1108  Subjective  very busy this week - unable to do HEP; feeling well - no new complaints    Patient is accompained by:  --   friend   Pertinent History  HTN, DM2, left T10 costotransversectomy, T10-T11 laminectomy for decompression, pedicle screws, T9-T12 (4/18), L5-S1 anterior retroperitoneal exposure, L5-S1 laminectomy, L5-S1 posterior lateral fixation with fusion (11/18); chronic pain    Patient Stated Goals  improve balance and transition to use of cane    Currently in Pain?  No/denies    Pain Score  0-No pain                       [facility] Adult PT  Treatment/Exercise - 06/02/18 0001      Ambulation/Gait   Ambulation/Gait  Yes    Ambulation/Gait Assistance  4: Min guard    Ambulation/Gait Assistance Details  ambulating with rollator - up down paved inclines and curb - improved forward gaze and scanning of environment     Ambulation Distance (Feet)  100 Feet    Assistive device  4-wheeled walker    Gait Pattern  Step-through pattern;Decreased stride length;Decreased stance time - right;Decreased step length - left;Decreased dorsiflexion - right;Decreased weight shift to right;Right genu recurvatum    Ambulation Surface  Level;Outdoor;Paved    Gait Comments  education on brake usage with inclines/curbs; education on how to lift rollator up curb as patient was lifitng far from body increasing stress at low back. Discussion of possibly lowering rollator handles even though patient prefers it being tall      Exercises   Exercises  Knee/Hip      Knee/Hip Exercises: Stretches   Passive Hamstring Stretch  Right;3 reps;30 seconds    Passive Hamstring Stretch Limitations  seated EOM     Other Knee/Hip Stretches  seated on blue physioball: pelvic tilts AP (limited motion), B hip flexion x 10 each with difficulty lifting R knee against gravity, B LAQ x 10 reps each       Knee/Hip Exercises: Seated   Long Arc Quad  Right;10 reps    Marching  Right;10 reps      Knee/Hip Exercises: Supine   Quad Sets  Right;10 reps    Bridges  Both;15 reps    Straight Leg Raises  AAROM;Right;10 reps    Other Supine Knee/Hip Exercises  R hip abduction/adduction - slow, controlled motion x 12                PT Short Term Goals - 05/23/18 1647      PT SHORT TERM GOAL #1   Title  patient to be independent with initial HEP for strength, balance and gait.     Time  6    Period  Weeks    Status  New    Target Date  07/04/18      PT SHORT TERM GOAL #2   Title  Patient to demonstrate TUG with rollator to </= 16 seconds for reduced fall risk     Baseline  05/23/18 - 23.11 sec    Time  6    Period  Weeks    Status  New    Target Date  07/04/18      PT SHORT TERM GOAL #3   Title  Patient to demonstrate 400' gait on level surfaces with rollator at Mod I without LOB or overt instability    Time  6    Period  Weeks    Status  New  Target Date  07/04/18      PT SHORT TERM GOAL #4   Title  Patient to perform sit to stand x 10 with unilateral UE support with Mod I demonstrating improved strength and efficiency of transfers    Time  6    Period  Weeks    Status  New    Target Date  07/04/18        PT Long Term Goals - 05/23/18 1653      PT LONG TERM GOAL #1   Title  Patient to be independent with final HEP for balance, strength, gait and other functional mobility    Time  13    Period  Weeks    Status  New    Target Date  08/21/18      PT LONG TERM GOAL #2   Title  patient to demonstrate gait with LRAD over various levels and surfaces for >/= 500' with MOD I without LOB or overt instability    Time  13    Period  Weeks    Status  New    Target Date  08/21/18      PT LONG TERM GOAL #3   Title  Patient to improve TUG to </= 13 seconds with LRAD demonstrating reduced fall risk    Baseline  TUG rollator: 23.11 sec; TUG SBQC: 1.07 min    Time  13    Period  Weeks    Status  New    Target Date  08/21/18      PT LONG TERM GOAL #4   Title  Patient to improve gait velocity with LRAD to at least 80ft/sec for decreased fall risk    Baseline  05/23/18: 1.8 ft/sec with rollator    Time  13    Period  Weeks    Status  New    Target Date  08/21/18      PT LONG TERM GOAL #5   Title  Paitent to improve R LE strength by >/= 1 manual muscle grade demonstrating improved strength and functional use    Baseline  05/23/18: R LE grossly 3-/5    Time  13    Period  Weeks    Status  New    Target Date  08/21/18            Plan - 06/02/18 1308    Clinical Impression Statement  Patient doing well - Session today focusing on  strengthening of R LE as well as gait on paved surfaces (up/down inclines and ramp) with education on safety iwth device such as brake usage and how to lift device up curb to reduce stress/strain on low back. Ther Ex on physioball today focusing on core activation as well as R LE strength against gravity. Education on seated HS stretch today with good tolerance and understanding. Will continue to progress.     Rehab Potential  Good    PT Frequency  2x / week    PT Duration  Other (comment)   13 weeks (90 days)   PT Treatment/Interventions  ADLs/Self Care Home Management;Cryotherapy;Electrical Stimulation;DME Instruction;Moist Heat;Gait training;Stair training;Functional mobility training;Therapeutic activities;Therapeutic exercise;Balance training;Patient/family education;Neuromuscular re-education;Manual techniques;Taping;Passive range of motion    PT Next Visit Plan  continue to progress gait and balance, strengthening of R LE    Consulted and Agree with Plan of Care  Patient       Patient will benefit from skilled therapeutic intervention in order to improve the following deficits and impairments:  Abnormal gait, Decreased activity tolerance, Decreased balance, Decreased safety awareness, Difficulty walking, Decreased strength, Decreased mobility, Decreased coordination, Pain, Impaired sensation  Visit Diagnosis: Unsteadiness on feet  Other abnormalities of gait and mobility  Muscle weakness (generalized)     Problem List Patient Active Problem List   Diagnosis Date Noted  . Debility   . Anemia of chronic disease   . Chronic pain syndrome   . Hypokalemia   . Acute lower UTI 09/04/2017  . L3 vertebral fracture ([place]) 09/01/2017  . Constipation due to pain medication   . Muscle spasms of both lower extremities   . Type 2 diabetes mellitus with peripheral neuropathy (HCC)   . Hypoalbuminemia due to protein-calorie malnutrition ([place])   . Radiculopathy 07/28/2017  . Lumbosacral  spondylosis with radiculopathy 07/26/2017  . Paraplegia, incomplete ([place]) 01/07/2017  . 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/27/2016  . Myelopathy ([place]) 12/21/2016  . Neuropathic pain   . 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 Paraparesis ([place]) 12/15/2016  . Type 2 diabetes mellitus without complication, without long-term current use of insulin ([place]) 11/02/2016  . Hyperlipidemia 11/02/2016  . Essential hypertension 11/02/2016  . 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     Lanney Gins, PT, DPT Supplemental Physical Therapist 06/02/18 1:13 PM Pager: [phone] Office: [facility] [address] [place], [place], [address] Phone: [phone]   Fax:  [phone]  Name: Megan [name] MRN: [id number] Date of Birth: 1950/10/14

## 2018-06-06 ENCOUNTER — Ambulatory Visit: Payer: PPO

## 2018-06-06 DIAGNOSIS — R2681 Unsteadiness on feet: Secondary | ICD-10-CM

## 2018-06-06 DIAGNOSIS — R2689 Other abnormalities of gait and mobility: Secondary | ICD-10-CM

## 2018-06-06 DIAGNOSIS — M6281 Muscle weakness (generalized): Secondary | ICD-10-CM

## 2018-06-06 IMAGING — DX DG CHEST 1V PORT
1 series · 1 of 1 positions shown · non-contrast
Comparison: None.

CLINICAL DATA: Preop.

EXAM:
PORTABLE CHEST 1 VIEW

[chest]
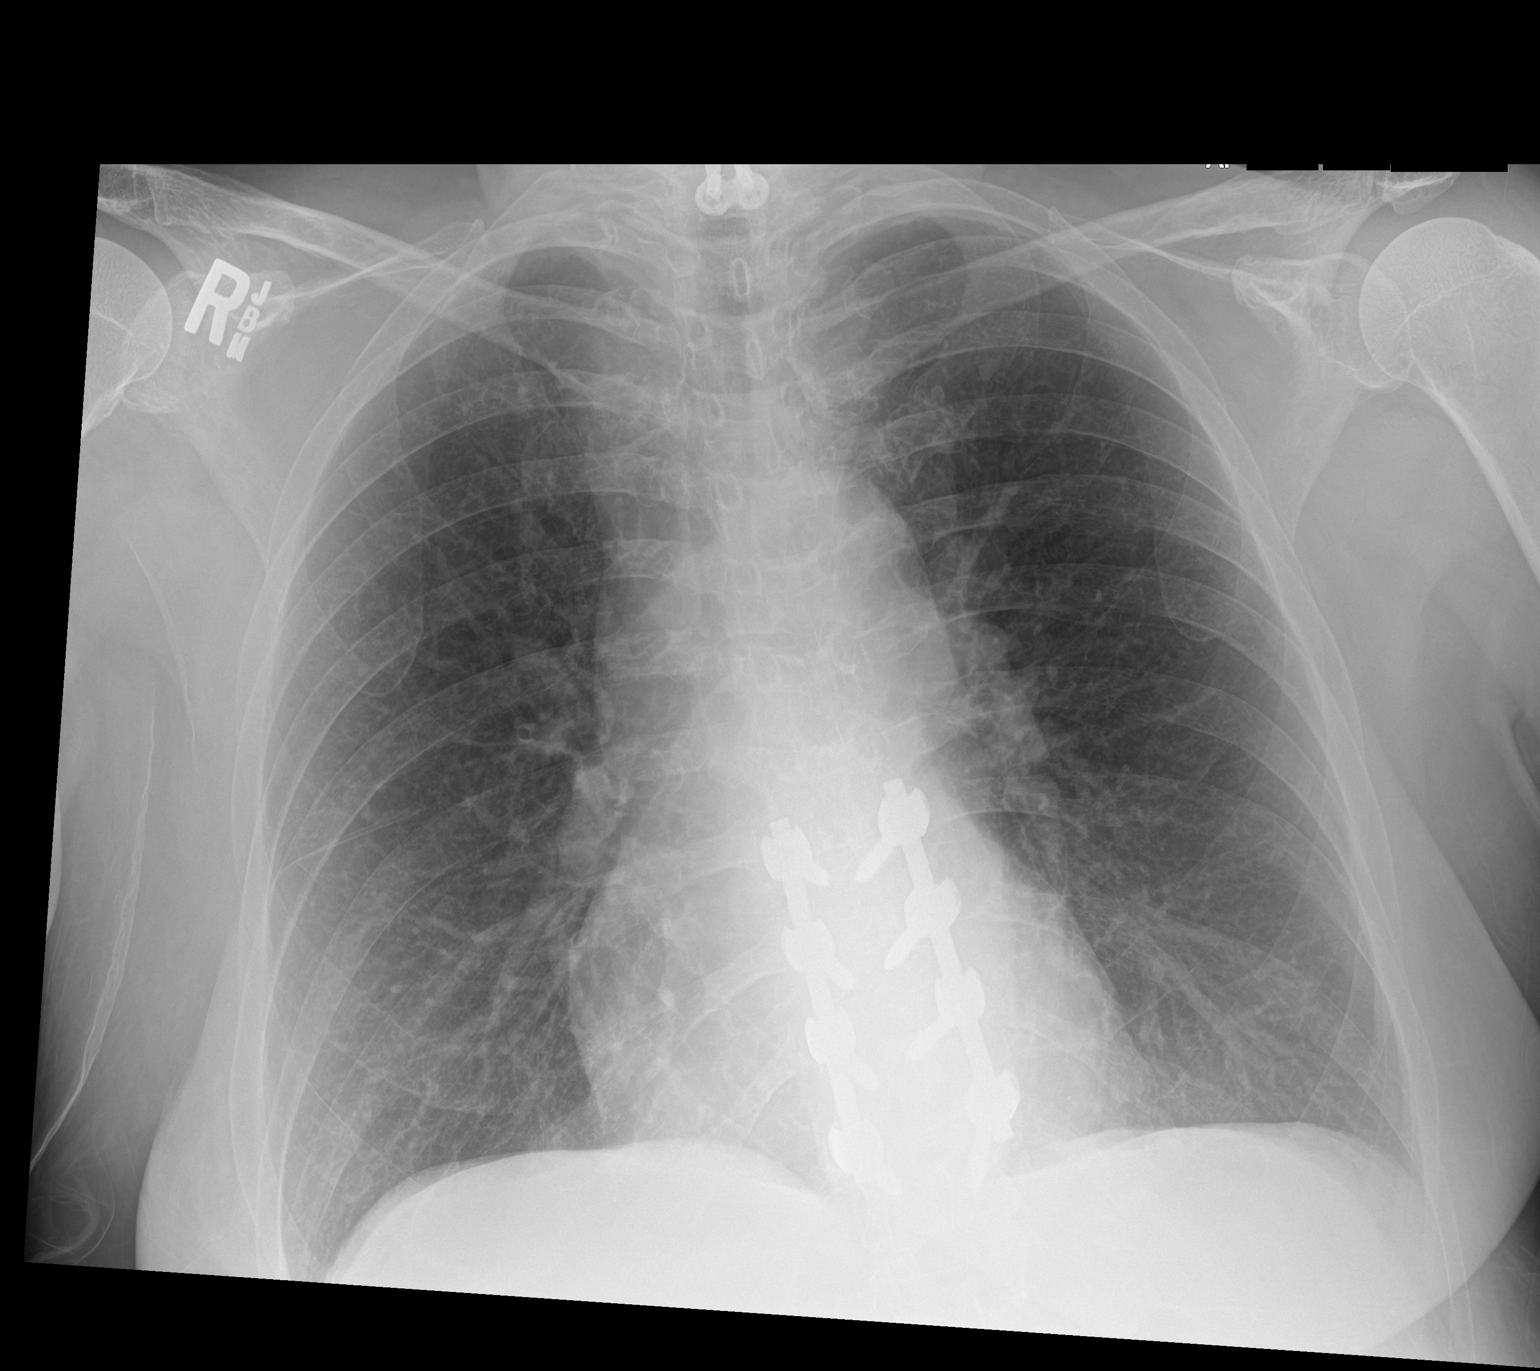

[1 of 1 positions shown; findings below may reference images not displayed]

FINDINGS: The cardiomediastinal contours are normal. The lungs are clear.
Pulmonary vasculature is normal. No consolidation, pleural effusion,
or pneumothorax. No acute osseous abnormalities are seen. Surgical
hardware in the lower thoracic spine. Surgical hardware in the lower
cervical spine is partially included.
IMPRESSION: No acute abnormality.

## 2018-06-06 IMAGING — RF DG LUMBAR SPINE 2-3V
1 series · 3 of 3 positions shown · non-contrast
Comparison: Fluoroscopic images July 26, 2017. MRI of
August 30, 2017.

CLINICAL DATA: Revision of L3-4 laminectomy.

EXAM:
LUMBAR SPINE - 2-3 VIEW; DG C-ARM GT 120 MIN
FLUOROSCOPY TIME:  36 seconds.

[Series 1: run · 3 of 3 slices shown]
[im 1/3]
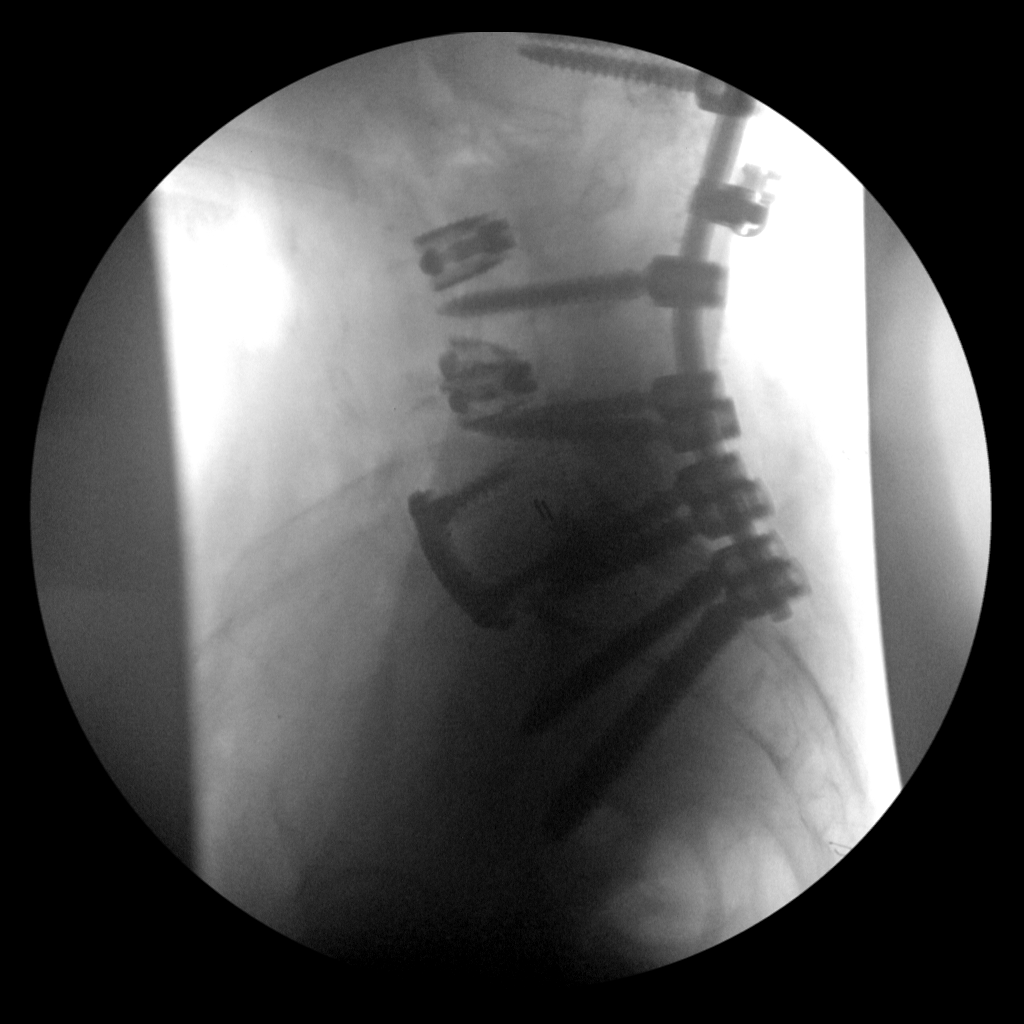
[im 2/3]
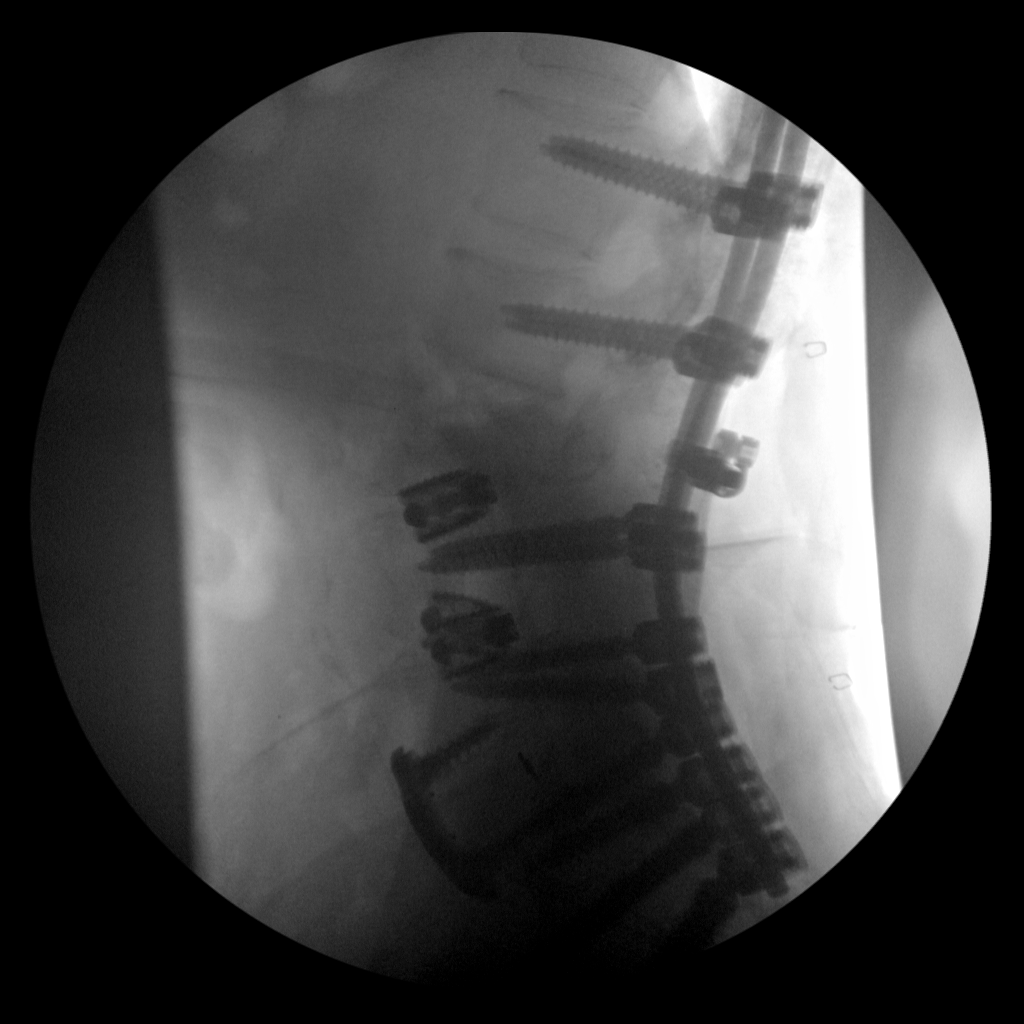
[im 3/3]
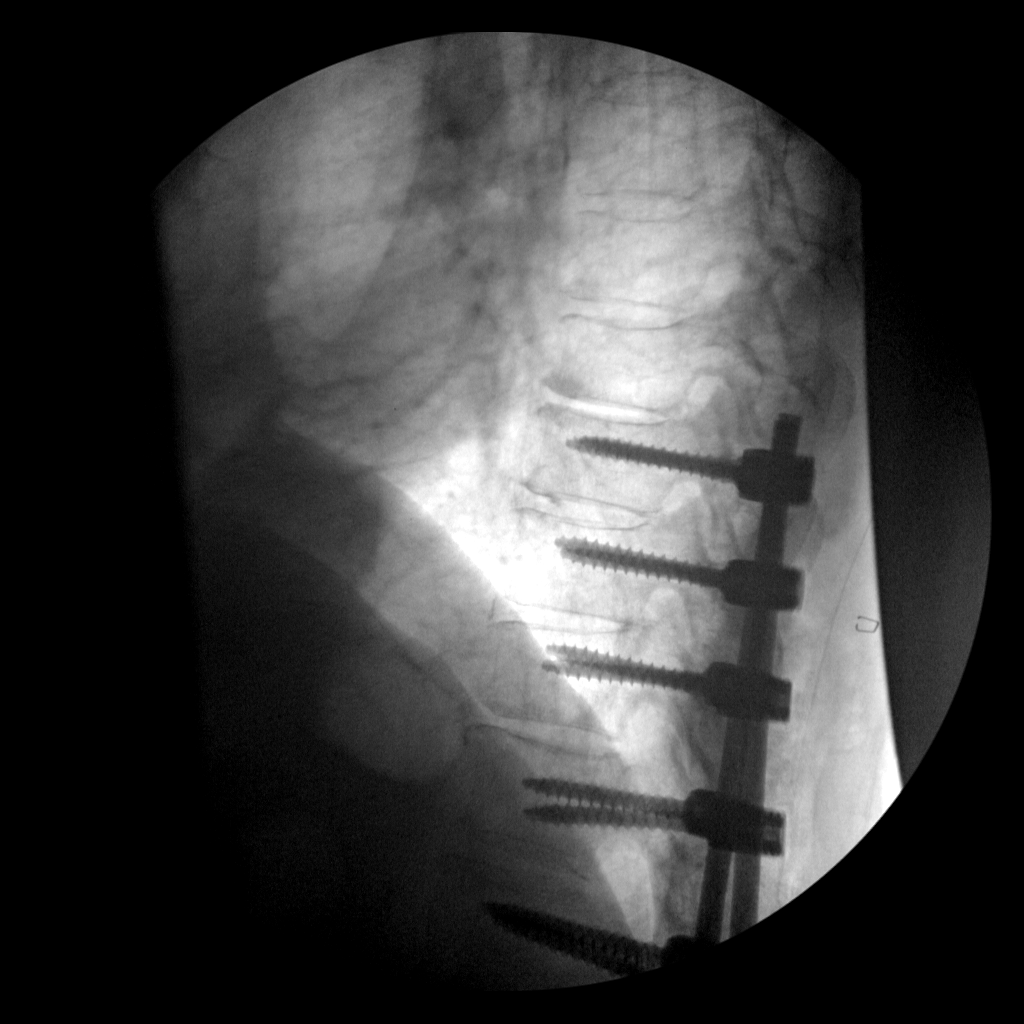

[3 of 3 positions shown; findings below may reference images not displayed]

FINDINGS: Three intraoperative fluoroscopic images were obtained of the lower
thoracic and lumbar spine. Previous surgical posterior fusion of
L4-5 and L5-S1 is noted. Interbody fusion of L3-4 and L4-5 is noted.
Previous surgical anterior fusion of L5-S1 is noted. There has been
superior extension of posterior fixation to what appears to T12.
IMPRESSION: Extensive postsurgical changes as described above. Interval superior
extension of fixation hardware to thoraco lumbar junction.

## 2018-06-06 NOTE — Therapy (Signed)
[facility] [address] [place], [place], [address] Phone: [phone]   Fax:  [phone]  Physical Therapy Treatment  Patient Details  Name: Megan [name] MRN: [id number] Date of Birth: 02-22-1951 Referring Provider: Dr. Vertell Limber   Encounter Date: 06/06/2018  PT End of Session - 06/06/18 1450    Visit Number  5    Number of Visits  27    Date for PT Re-Evaluation  08/21/18    Authorization Type  Medicare    PT Start Time  1447    PT Stop Time  1530    PT Time Calculation (min)  43 min    Activity Tolerance  Patient tolerated treatment well    Behavior During Therapy  [facility] for tasks assessed/performed       Past Medical History:  Diagnosis Date  . Arthritis    "hands, back" (07/26/2017)  . Dyspnea    W/ PHYS CONDITION   . GERD (gastroesophageal reflux disease)   . History of bronchitis    "not since I quit smoking" (07/26/2017)  . History of kidney stones   . History of shingles   . Hyperlipidemia    takes Fish Oil daily  . Hypertension   . Neuromuscular disorder (HCC)    tingling toes  . 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[place]) 12/15/2016  . Pneumonia 2009  . PONV (postoperative nausea and vomiting)   . Restless leg   . Type 2 diabetes mellitus ([place])   . Uterine cancer ([place]) 1979   S/P hysterectomy  . Weakness    numbness and tingling in both feet r/t back    Past Surgical History:  Procedure Laterality Date  . ABDOMINAL EXPOSURE N/A 07/26/2017   Procedure: ABDOMINAL EXPOSURE;  Surgeon: Angelia Mould, MD;  Location: [facility];  Service: Vascular;  Laterality: N/A;  . ANTERIOR CERVICAL DECOMP/DISCECTOMY FUSION  2001  . ANTERIOR LUMBAR FUSION N/A 07/26/2017   Procedure: Lumbar five-Sacral one Anterior lumbar interbody fusion with Dr. Deitra Mayo for approach;  Surgeon: Ditty, Kevan Ny, MD;  Location: [facility];  Service: Neurosurgery;  Laterality: N/A;  . APPLICATION OF ROBOTIC  ASSISTANCE FOR SPINAL PROCEDURE  12/19/2016   Procedure: APPLICATION OF ROBOTIC ASSISTANCE FOR SPINAL PROCEDURE;  Surgeon: Kevan Ny Ditty, MD;  Location: [facility];  Service: Neurosurgery;;  . APPLICATION OF ROBOTIC ASSISTANCE FOR SPINAL PROCEDURE N/A 07/26/2017   Procedure: APPLICATION OF ROBOTIC ASSISTANCE FOR SPINAL PROCEDURE;  Surgeon: Ditty, Kevan Ny, MD;  Location: [facility];  Service: Neurosurgery;  Laterality: N/A;  . APPLICATION OF ROBOTIC ASSISTANCE FOR SPINAL PROCEDURE N/A 09/02/2017   Procedure: APPLICATION OF ROBOTIC ASSISTANCE FOR SPINAL PROCEDURE;  Surgeon: Ditty, Kevan Ny, MD;  Location: [facility];  Service: Neurosurgery;  Laterality: N/A;  . BACK SURGERY    . BLADDER SUSPENSION  1991   tack  . INGUINAL HERNIA REPAIR Right 1991  . LUMBAR FUSION  2016; 2017; 07/26/2017   L4-5; L2-3; L5-S1  . POSTERIOR LUMBAR FUSION 4 WITH HARDWARE REMOVAL N/A 09/02/2017   Procedure: Lumbar three-four redo laminectomy; Repositioning of Left Sacral two screw; Extension of lumbar fusion to Thoracic twelve;  Surgeon: Ditty, Kevan Ny, MD;  Location: [facility];  Service: Neurosurgery;  Laterality: N/A;  . TONSILLECTOMY AND ADENOIDECTOMY  1959  . TUMOR EXCISION     WERTHIN'S TUMORS BOTH SIDES OF NECK  . VAGINAL HYSTERECTOMY  1979    There were no vitals filed for this visit.  Subjective Assessment - 06/06/18 1449  Subjective  No new complaints, no falls to report. Pt states HEP is going well, some are more difficult than others but pt is still able to do them.     Patient is accompained by:  Family member   Spouse   Pertinent History  HTN, DM2, left T10 costotransversectomy, T10-T11 laminectomy for decompression, pedicle screws, T9-T12 (4/18), L5-S1 anterior retroperitoneal exposure, L5-S1 laminectomy, L5-S1 posterior lateral fixation with fusion (11/18); chronic pain    Limitations  Standing;Walking    How long can you walk comfortably?  household distances, limited community ambulation  with RW vs rollator    Patient Stated Goals  improve balance and transition to use of cane    Currently in Pain?  No/denies        [facility] Adult PT Treatment/Exercise - 06/06/18 1508      Ambulation/Gait   Ambulation/Gait  Yes    Ambulation/Gait Assistance  4: Min guard    Ambulation/Gait Assistance Details  VC's for posture and looking forward while ambulating.     Ambulation Distance (Feet)  115 Feet    Assistive device  4-wheeled walker    Gait Pattern  Step-through pattern;Decreased stride length;Decreased stance time - right;Decreased step length - left;Decreased dorsiflexion - right;Decreased weight shift to right;Right genu recurvatum    Ambulation Surface  Level;Indoor      High Level Balance   High Level Balance Activities  Tandem walking;Marching forwards;Marching backwards;Backward walking;Side stepping    High Level Balance Comments  In parallel bars on blue mat with SUE for support, VC's on looking forward and weightshift to RLE.       Neuro Re-ed    Neuro Re-ed Details   Pt seated on green disc while shifting weight side/fwds bending at hips, bkwds holding arms forward to activiate core muscles, using zoomball while shifting weight from L to R side, performing seated marches, LAQ's, and hip abduction while seated on disc. VC's for instruction, proper technique, weightshift to RLE and maintaining posture during each task.         PT Short Term Goals - 06/06/18 1451      PT SHORT TERM GOAL #1   Title  patient to be independent with initial HEP for strength, balance and gait.     Time  6    Period  Weeks    Status  New      PT SHORT TERM GOAL #2   Title  Patient to demonstrate TUG with rollator to </= 16 seconds for reduced fall risk    Baseline  05/23/18 - 23.11 sec    Time  6    Period  Weeks    Status  New      PT SHORT TERM GOAL #3   Title  Patient to demonstrate 400' gait on level surfaces with rollator at Mod I without LOB or overt instability    Time  6     Period  Weeks    Status  New      PT SHORT TERM GOAL #4   Title  Patient to perform sit to stand x 10 with unilateral UE support with Mod I demonstrating improved strength and efficiency of transfers    Time  6    Period  Weeks    Status  New        PT Long Term Goals - 06/06/18 1451      PT LONG TERM GOAL #1   Title  Patient to be independent with final HEP for  balance, strength, gait and other functional mobility    Time  13    Period  Weeks    Status  New      PT LONG TERM GOAL #2   Title  patient to demonstrate gait with LRAD over various levels and surfaces for >/= 500' with MOD I without LOB or overt instability    Time  13    Period  Weeks    Status  New      PT LONG TERM GOAL #3   Title  Patient to improve TUG to </= 13 seconds with LRAD demonstrating reduced fall risk    Baseline  TUG rollator: 23.11 sec; TUG SBQC: 1.07 min    Time  13    Period  Weeks    Status  New      PT LONG TERM GOAL #4   Title  Patient to improve gait velocity with LRAD to at least 55ft/sec for decreased fall risk    Baseline  05/23/18: 1.8 ft/sec with rollator    Time  13    Period  Weeks    Status  New      PT LONG TERM GOAL #5   Title  Paitent to improve R LE strength by >/= 1 manual muscle grade demonstrating improved strength and functional use    Baseline  05/23/18: R LE grossly 3-/5    Time  13    Period  Weeks    Status  New        Plan - 06/06/18 1451    Clinical Impression Statement  Todays skilled session focused on allowing activation of core/RLE through weightshift, high level balance to improve stability during transfers and gait training, and gait training with Rollator walker to improve activity tolerance to allow for longer distances. Pt should benefit from continued PT session to improve towards STG's.     Rehab Potential  Good    PT Frequency  2x / week    PT Duration  Other (comment)   13 weeks (90 days)   PT Treatment/Interventions  ADLs/Self Care Home  Management;Cryotherapy;Electrical Stimulation;DME Instruction;Moist Heat;Gait training;Stair training;Functional mobility training;Therapeutic activities;Therapeutic exercise;Balance training;Patient/family education;Neuromuscular re-education;Manual techniques;Taping;Passive range of motion    PT Next Visit Plan  Gait with knee brace to prevent hyperextension, high level balance and strengthening of R LE. Pt stated that she had a great experience with the [facility] and would like to incorporate that into her treatment.     Consulted and Agree with Plan of Care  Patient       Patient will benefit from skilled therapeutic intervention in order to improve the following deficits and impairments:  Abnormal gait, Decreased activity tolerance, Decreased balance, Decreased safety awareness, Difficulty walking, Decreased strength, Decreased mobility, Decreased coordination, Pain, Impaired sensation  Visit Diagnosis: Unsteadiness on feet  Other abnormalities of gait and mobility  Muscle weakness (generalized)     Problem List Patient Active Problem List   Diagnosis Date Noted  . Debility   . Anemia of chronic disease   . Chronic pain syndrome   . Hypokalemia   . Acute lower UTI 09/04/2017  . L3 vertebral fracture ([place]) 09/01/2017  . Constipation due to pain medication   . Muscle spasms of both lower extremities   . Type 2 diabetes mellitus with peripheral neuropathy (HCC)   . Hypoalbuminemia due to protein-calorie malnutrition ([place])   . Radiculopathy 07/28/2017  . Lumbosacral spondylosis with radiculopathy 07/26/2017  . Paraplegia, incomplete ([place]) 01/07/2017  .  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/27/2016  . Myelopathy ([place]) 12/21/2016  . Neuropathic pain   . 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 Paraparesis ([place]) 12/15/2016  . Type 2 diabetes mellitus without complication, without long-term current use of insulin ([place]) 11/02/2016  . Hyperlipidemia 11/02/2016  . Essential  hypertension 11/02/2016  . 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   Megan [name], PTA  Megan [name] 06/06/2018, 3:59 PM  [facility] [address] [place], [place], [address] Phone: [phone]   Fax:  [phone]  Name: Megan [name] MRN: [id number] Date of Birth: 1950/12/24

## 2018-06-09 ENCOUNTER — Ambulatory Visit: Payer: PPO

## 2018-06-09 DIAGNOSIS — R2689 Other abnormalities of gait and mobility: Secondary | ICD-10-CM

## 2018-06-09 DIAGNOSIS — R2681 Unsteadiness on feet: Secondary | ICD-10-CM | POA: Diagnosis not present

## 2018-06-09 DIAGNOSIS — M6281 Muscle weakness (generalized): Secondary | ICD-10-CM

## 2018-06-09 NOTE — Therapy (Signed)
[facility] [address] [place], [place], [address] Phone: [phone]   Fax:  [phone]  Physical Therapy Treatment  Patient Details  Name: Megan [name] MRN: [id number] Date of Birth: 31-Jul-1951 Referring Provider (PT): Dr. Vertell Limber   Encounter Date: 06/09/2018  PT End of Session - 06/09/18 1457    Visit Number  6    Number of Visits  27    Date for PT Re-Evaluation  08/21/18    Authorization Type  Medicare    PT Start Time  1450    PT Stop Time  1530    PT Time Calculation (min)  40 min    Activity Tolerance  Patient tolerated treatment well    Behavior During Therapy  [facility] for tasks assessed/performed       Past Medical History:  Diagnosis Date  . Arthritis    "hands, back" (07/26/2017)  . Dyspnea    W/ PHYS CONDITION   . GERD (gastroesophageal reflux disease)   . History of bronchitis    "not since I quit smoking" (07/26/2017)  . History of kidney stones   . History of shingles   . Hyperlipidemia    takes Fish Oil daily  . Hypertension   . Neuromuscular disorder (HCC)    tingling toes  . 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[place]) 12/15/2016  . Pneumonia 2009  . PONV (postoperative nausea and vomiting)   . Restless leg   . Type 2 diabetes mellitus ([place])   . Uterine cancer ([place]) 1979   S/P hysterectomy  . Weakness    numbness and tingling in both feet r/t back    Past Surgical History:  Procedure Laterality Date  . ABDOMINAL EXPOSURE N/A 07/26/2017   Procedure: ABDOMINAL EXPOSURE;  Surgeon: Angelia Mould, MD;  Location: [facility];  Service: Vascular;  Laterality: N/A;  . ANTERIOR CERVICAL DECOMP/DISCECTOMY FUSION  2001  . ANTERIOR LUMBAR FUSION N/A 07/26/2017   Procedure: Lumbar five-Sacral one Anterior lumbar interbody fusion with Dr. Deitra Mayo for approach;  Surgeon: Ditty, Kevan Ny, MD;  Location: [facility];  Service: Neurosurgery;  Laterality: N/A;  . APPLICATION OF ROBOTIC  ASSISTANCE FOR SPINAL PROCEDURE  12/19/2016   Procedure: APPLICATION OF ROBOTIC ASSISTANCE FOR SPINAL PROCEDURE;  Surgeon: Kevan Ny Ditty, MD;  Location: [facility];  Service: Neurosurgery;;  . APPLICATION OF ROBOTIC ASSISTANCE FOR SPINAL PROCEDURE N/A 07/26/2017   Procedure: APPLICATION OF ROBOTIC ASSISTANCE FOR SPINAL PROCEDURE;  Surgeon: Ditty, Kevan Ny, MD;  Location: [facility];  Service: Neurosurgery;  Laterality: N/A;  . APPLICATION OF ROBOTIC ASSISTANCE FOR SPINAL PROCEDURE N/A 09/02/2017   Procedure: APPLICATION OF ROBOTIC ASSISTANCE FOR SPINAL PROCEDURE;  Surgeon: Ditty, Kevan Ny, MD;  Location: [facility];  Service: Neurosurgery;  Laterality: N/A;  . BACK SURGERY    . BLADDER SUSPENSION  1991   tack  . INGUINAL HERNIA REPAIR Right 1991  . LUMBAR FUSION  2016; 2017; 07/26/2017   L4-5; L2-3; L5-S1  . POSTERIOR LUMBAR FUSION 4 WITH HARDWARE REMOVAL N/A 09/02/2017   Procedure: Lumbar three-four redo laminectomy; Repositioning of Left Sacral two screw; Extension of lumbar fusion to Thoracic twelve;  Surgeon: Ditty, Kevan Ny, MD;  Location: [facility];  Service: Neurosurgery;  Laterality: N/A;  . TONSILLECTOMY AND ADENOIDECTOMY  1959  . TUMOR EXCISION     WERTHIN'S TUMORS BOTH SIDES OF NECK  . VAGINAL HYSTERECTOMY  1979    There were no vitals filed for this visit.  Subjective Assessment - 06/09/18  1456    Subjective  No new complaints, no falls to report, HEP is still going well.     Patient is accompained by:  Family member   Spouse   Pertinent History  HTN, DM2, left T10 costotransversectomy, T10-T11 laminectomy for decompression, pedicle screws, T9-T12 (4/18), L5-S1 anterior retroperitoneal exposure, L5-S1 laminectomy, L5-S1 posterior lateral fixation with fusion (11/18); chronic pain    Limitations  Standing;Walking    How long can you walk comfortably?  household distances, limited community ambulation with RW vs rollator    Patient Stated Goals  improve balance and  transition to use of cane    Currently in Pain?  No/denies        [facility] Adult PT Treatment/Exercise - 06/09/18 1459      Ambulation/Gait   Ambulation/Gait  Yes    Ambulation/Gait Assistance  5: Supervision    Ambulation/Gait Assistance Details  sweedish knee brace applied to RLE to prevent hyperextension, pt preseneted with no hyperextension with brace donned while able to ambulate at a her normal pace. VC's for proper use of AD and forward gaze.    Ambulation Distance (Feet)  345 Feet    Assistive device  4-wheeled walker;Other (Comment)   [place]ish knee cage   Gait Pattern  Step-through pattern;Decreased stride length;Decreased stance time - right;Decreased step length - left;Decreased dorsiflexion - right;Decreased weight shift to right;Right genu recurvatum    Ambulation Surface  Level;Indoor      Exercises   Exercises  --      Knee/Hip Exercises: Aerobic   Nustep  L2, 10 mins, 550 steps for BLE strengthening and activity tolerance.         PT Short Term Goals - 06/09/18 1458      PT SHORT TERM GOAL #1   Title  patient to be independent with initial HEP for strength, balance and gait.     Time  6    Period  Weeks    Status  New      PT SHORT TERM GOAL #2   Title  Patient to demonstrate TUG with rollator to </= 16 seconds for reduced fall risk    Baseline  05/23/18 - 23.11 sec    Time  6    Period  Weeks    Status  New      PT SHORT TERM GOAL #3   Title  Patient to demonstrate 400' gait on level surfaces with rollator at Mod I without LOB or overt instability    Time  6    Period  Weeks    Status  New      PT SHORT TERM GOAL #4   Title  Patient to perform sit to stand x 10 with unilateral UE support with Mod I demonstrating improved strength and efficiency of transfers    Time  6    Period  Weeks    Status  New        PT Long Term Goals - 06/09/18 1458      PT LONG TERM GOAL #1   Title  Patient to be independent with final HEP for balance, strength, gait  and other functional mobility    Time  13    Period  Weeks    Status  New      PT LONG TERM GOAL #2   Title  patient to demonstrate gait with LRAD over various levels and surfaces for >/= 500' with MOD I without LOB or overt instability    Time  13    Period  Weeks    Status  New      PT LONG TERM GOAL #3   Title  Patient to improve TUG to </= 13 seconds with LRAD demonstrating reduced fall risk    Baseline  TUG rollator: 23.11 sec; TUG SBQC: 1.07 min    Time  13    Period  Weeks    Status  New      PT LONG TERM GOAL #4   Title  Patient to improve gait velocity with LRAD to at least 14ft/sec for decreased fall risk    Baseline  05/23/18: 1.8 ft/sec with rollator    Time  13    Period  Weeks    Status  New      PT LONG TERM GOAL #5   Title  Paitent to improve R LE strength by >/= 1 manual muscle grade demonstrating improved strength and functional use    Baseline  05/23/18: R LE grossly 3-/5    Time  13    Period  Weeks    Status  New       Plan - 06/09/18 1553    Clinical Impression Statement  Todays skilled session focused on BLE strengthening/activity tolerance and gait with 4 wheeled walker with knee brace donned to prevent RLE hyerextension. Pt showed progress with knee brace donnned during gait training increasing flexion with pt able to walk at her normal pace. Pt should benefit from continued PT session to continue to work towards goals.     Rehab Potential  Good    PT Frequency  2x / week    PT Duration  Other (comment)   13 weeks (90 days)   PT Treatment/Interventions  ADLs/Self Care Home Management;Cryotherapy;Electrical Stimulation;DME Instruction;Moist Heat;Gait training;Stair training;Functional mobility training;Therapeutic activities;Therapeutic exercise;Balance training;Patient/family education;Neuromuscular re-education;Manual techniques;Taping;Passive range of motion    PT Next Visit Plan  Gait with knee brace to prevent hyperextension, high level balance and  strengthening of R LE, Nustep.     Consulted and Agree with Plan of Care  Patient       Patient will benefit from skilled therapeutic intervention in order to improve the following deficits and impairments:  Abnormal gait, Decreased activity tolerance, Decreased balance, Decreased safety awareness, Difficulty walking, Decreased strength, Decreased mobility, Decreased coordination, Pain, Impaired sensation  Visit Diagnosis: Unsteadiness on feet  Other abnormalities of gait and mobility  Muscle weakness (generalized)     Problem List Patient Active Problem List   Diagnosis Date Noted  . Debility   . Anemia of chronic disease   . Chronic pain syndrome   . Hypokalemia   . Acute lower UTI 09/04/2017  . L3 vertebral fracture ([place]) 09/01/2017  . Constipation due to pain medication   . Muscle spasms of both lower extremities   . Type 2 diabetes mellitus with peripheral neuropathy (HCC)   . Hypoalbuminemia due to protein-calorie malnutrition ([place])   . Radiculopathy 07/28/2017  . Lumbosacral spondylosis with radiculopathy 07/26/2017  . Paraplegia, incomplete ([place]) 01/07/2017  . 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/27/2016  . Myelopathy ([place]) 12/21/2016  . Neuropathic pain   . 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 Paraparesis ([place]) 12/15/2016  . Type 2 diabetes mellitus without complication, without long-term current use of insulin ([place]) 11/02/2016  . Hyperlipidemia 11/02/2016  . Essential hypertension 11/02/2016  . 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   Dawnetta Copenhaver, PTA  Allison Silva A Soundra Lampley 06/09/2018, 3:58  PM  [facility] [address] [place], [place], [address] Phone: [phone]   Fax:  [phone]  Name: Megan [name] MRN: [id number] Date of Birth: Jul 30, 1951

## 2018-06-14 ENCOUNTER — Ambulatory Visit: Payer: PPO | Attending: Physical Medicine & Rehabilitation | Admitting: Physical Therapy

## 2018-06-14 ENCOUNTER — Encounter: Payer: Self-pay | Admitting: Physical Therapy

## 2018-06-14 DIAGNOSIS — M6281 Muscle weakness (generalized): Secondary | ICD-10-CM | POA: Diagnosis not present

## 2018-06-14 DIAGNOSIS — R2689 Other abnormalities of gait and mobility: Secondary | ICD-10-CM | POA: Insufficient documentation

## 2018-06-14 DIAGNOSIS — R2681 Unsteadiness on feet: Secondary | ICD-10-CM

## 2018-06-14 NOTE — Patient Instructions (Signed)
Access Code: [email or web address]  URL: [email or web address]  Date: 06/14/2018  Prepared by: Floreen Comber   Exercises  Standing Balance in Corner with Eyes Closed - 10 reps - 3 sets - 30 hold - 1x daily - 7x weekly  Walking March - 10 reps - 3 sets - 1x daily - 7x weekly  Supine Hip Flexor Stretch with Weight - 3 reps - 1 sets - 30 hold - 1x daily - 7x weekly  Side Stepping with Counter Support - 10 reps - 3 sets - 1x daily - 7x weekly  Standing Tandem Balance with Counter Support - 10 reps - 3 sets - 30 hold - 1x daily - 5x weekly  Sit to Stand with Counter Support - 10 reps - 2 sets - 1x daily - 5x weekly

## 2018-06-14 NOTE — Therapy (Signed)
[facility] [address] [place], [place], [address] Phone: [phone]   Fax:  [phone]  Physical Therapy Treatment  Patient Details  Name: Megan [name] MRN: [id number] Date of Birth: April 22, 1951 Referring Provider (PT): Dr. Vertell Limber   Encounter Date: 06/14/2018  PT End of Session - 06/14/18 1208    Visit Number  7    Number of Visits  27    Date for PT Re-Evaluation  08/21/18    Authorization Type  Medicare    PT Start Time  8453    PT Stop Time  1100    PT Time Calculation (min)  45 min    Activity Tolerance  Patient tolerated treatment well    Behavior During Therapy  [facility] for tasks assessed/performed       Past Medical History:  Diagnosis Date  . Arthritis    "hands, back" (07/26/2017)  . Dyspnea    W/ PHYS CONDITION   . GERD (gastroesophageal reflux disease)   . History of bronchitis    "not since I quit smoking" (07/26/2017)  . History of kidney stones   . History of shingles   . Hyperlipidemia    takes Fish Oil daily  . Hypertension   . Neuromuscular disorder (HCC)    tingling toes  . 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[place]) 12/15/2016  . Pneumonia 2009  . PONV (postoperative nausea and vomiting)   . Restless leg   . Type 2 diabetes mellitus ([place])   . Uterine cancer ([place]) 1979   S/P hysterectomy  . Weakness    numbness and tingling in both feet r/t back    Past Surgical History:  Procedure Laterality Date  . ABDOMINAL EXPOSURE N/A 07/26/2017   Procedure: ABDOMINAL EXPOSURE;  Surgeon: Angelia Mould, MD;  Location: [facility];  Service: Vascular;  Laterality: N/A;  . ANTERIOR CERVICAL DECOMP/DISCECTOMY FUSION  2001  . ANTERIOR LUMBAR FUSION N/A 07/26/2017   Procedure: Lumbar five-Sacral one Anterior lumbar interbody fusion with Dr. Deitra Mayo for approach;  Surgeon: Ditty, Kevan Ny, MD;  Location: [facility];  Service: Neurosurgery;  Laterality: N/A;  . APPLICATION OF ROBOTIC  ASSISTANCE FOR SPINAL PROCEDURE  12/19/2016   Procedure: APPLICATION OF ROBOTIC ASSISTANCE FOR SPINAL PROCEDURE;  Surgeon: Kevan Ny Ditty, MD;  Location: [facility];  Service: Neurosurgery;;  . APPLICATION OF ROBOTIC ASSISTANCE FOR SPINAL PROCEDURE N/A 07/26/2017   Procedure: APPLICATION OF ROBOTIC ASSISTANCE FOR SPINAL PROCEDURE;  Surgeon: Ditty, Kevan Ny, MD;  Location: [facility];  Service: Neurosurgery;  Laterality: N/A;  . APPLICATION OF ROBOTIC ASSISTANCE FOR SPINAL PROCEDURE N/A 09/02/2017   Procedure: APPLICATION OF ROBOTIC ASSISTANCE FOR SPINAL PROCEDURE;  Surgeon: Ditty, Kevan Ny, MD;  Location: [place] [facility];  Service: Neurosurgery;  Laterality: N/A;  . BACK SURGERY    . BLADDER SUSPENSION  1991   tack  . INGUINAL HERNIA REPAIR Right 1991  . LUMBAR FUSION  2016; 2017; 07/26/2017   L4-5; L2-3; L5-S1  . POSTERIOR LUMBAR FUSION 4 WITH HARDWARE REMOVAL N/A 09/02/2017   Procedure: Lumbar three-four redo laminectomy; Repositioning of Left Sacral two screw; Extension of lumbar fusion to Thoracic twelve;  Surgeon: Ditty, Kevan Ny, MD;  Location: [facility];  Service: Neurosurgery;  Laterality: N/A;  . TONSILLECTOMY AND ADENOIDECTOMY  1959  . TUMOR EXCISION     WERTHIN'S TUMORS BOTH SIDES OF NECK  . VAGINAL HYSTERECTOMY  1979    There were no vitals filed for this visit.  Subjective Assessment - 06/14/18  1021    Subjective  Pt reports no new compliants or recent falls.     Patient is accompained by:  Family member   Spouse   Pertinent History  HTN, DM2, left T10 costotransversectomy, T10-T11 laminectomy for decompression, pedicle screws, T9-T12 (4/18), L5-S1 anterior retroperitoneal exposure, L5-S1 laminectomy, L5-S1 posterior lateral fixation with fusion (11/18); chronic pain    Limitations  Standing;Walking    How long can you walk comfortably?  household distances, limited community ambulation with RW vs rollator    Patient Stated Goals  improve balance and transition to use of cane     Currently in Pain?  No/denies                       [facility] Adult PT Treatment/Exercise - 06/14/18 1157      Transfers   Transfers  Sit to Stand;Stand to Sit    Sit to Stand  6: Modified independent (Device/Increase time);5: Supervision    Sit to Stand Details (indicate cue type and reason)  Pt performs STS's mod I when using rollator and requires supervision with no AD    Stand to Sit  6: Modified independent (Device/Increase time)    Number of Reps  1 set;Other reps (comment)   5 reps    Comments  Pt performs x5 STS from edge of mat for LE strengthening with no AD using UE's on mat to achieve stand. Pt requires VC's for proper technique and to scoot forward prior to stand to dec reliance of posterior thighs pushing on mat.       Balance   Balance Assessed  Yes      Static Standing Balance   Static Standing - Balance Support  No upper extremity supported   Pt performs for 3x30 seconds with eyes closed   Tandem Stance - Right Leg  --   1x30 seconds with intermittant UE assist    Tandem Stance - Left Leg  --   1x30 seconds with intermittant UE assist     Exercises   Exercises  Knee/Hip      Knee/Hip Exercises: Stretches   Hip Flexor Stretch  Right;3 reps;30 seconds    Hip Flexor Stretch Limitations  Pt performs R hip flexor stretch on mat in modified supine with blue wedge with R LE hanging off edge of mat. Therapist provides VC's for proper technique and education to perform knee flexion to increase stretch intensity.       Knee/Hip Exercises: Aerobic   Nustep  Pt performs NuStep for total of 6 minutes with rest breaks at every 2 minute interval. Pt performs initial 2 minutes on resistance level 7 using UE's & LE's for assist with red resistance band around distal thigh to increase hip abductor activation during exercise. Pt then performs subsequent trial durations with resistance lowered to level 5 with only LE's utilized for exercise. Pt reports RPE of 7/10. NuStep  utilized for LE strengthening.               PT Education - 06/14/18 1207    Education Details  Therapist provided education regarding upright posture during STS exercise, revised HEP, and to maintain proximity to rollator to prevent excessive trunk flexion.     Person(s) Educated  Patient    Methods  Explanation    Comprehension  Verbalized understanding;Returned demonstration       PT Short Term Goals - 06/09/18 1458      PT SHORT TERM GOAL #1  Title  patient to be independent with initial HEP for strength, balance and gait.     Time  6    Period  Weeks    Status  New      PT SHORT TERM GOAL #2   Title  Patient to demonstrate TUG with rollator to </= 16 seconds for reduced fall risk    Baseline  05/23/18 - 23.11 sec    Time  6    Period  Weeks    Status  New      PT SHORT TERM GOAL #3   Title  Patient to demonstrate 400' gait on level surfaces with rollator at Mod I without LOB or overt instability    Time  6    Period  Weeks    Status  New      PT SHORT TERM GOAL #4   Title  Patient to perform sit to stand x 10 with unilateral UE support with Mod I demonstrating improved strength and efficiency of transfers    Time  6    Period  Weeks    Status  New        PT Long Term Goals - 06/09/18 1458      PT LONG TERM GOAL #1   Title  Patient to be independent with final HEP for balance, strength, gait and other functional mobility    Time  13    Period  Weeks    Status  New      PT LONG TERM GOAL #2   Title  patient to demonstrate gait with LRAD over various levels and surfaces for >/= 500' with MOD I without LOB or overt instability    Time  13    Period  Weeks    Status  New      PT LONG TERM GOAL #3   Title  Patient to improve TUG to </= 13 seconds with LRAD demonstrating reduced fall risk    Baseline  TUG rollator: 23.11 sec; TUG SBQC: 1.07 min    Time  13    Period  Weeks    Status  New      PT LONG TERM GOAL #4   Title  Patient to improve gait  velocity with LRAD to at least 40ft/sec for decreased fall risk    Baseline  05/23/18: 1.8 ft/sec with rollator    Time  13    Period  Weeks    Status  New      PT LONG TERM GOAL #5   Title  Paitent to improve R LE strength by >/= 1 manual muscle grade demonstrating improved strength and functional use    Baseline  05/23/18: R LE grossly 3-/5    Time  13    Period  Weeks    Status  New            Plan - 06/14/18 1209    Clinical Impression Statement  Today's session focused on aerobic exercise training on NuStep with increased level of resistance and hip abductor activation for strengthening and updating patient's HEP. Pt demonstrates improvement with ability to prevent excessive knee hyperextension with observable increased control. Pt demonstrates and verbalizes understanding and proper technique with all updated HEP execises to perform safely at home. Pt will continue to benefit from skilled physical therapy to address strengthening, balance, and functional mobility deficits to further progress patient towards achieving her LTG's.     Rehab Potential  Good    PT Frequency  2x / week    PT Duration  Other (comment)   13 weeks (90 days)   PT Treatment/Interventions  ADLs/Self Care Home Management;Cryotherapy;Electrical Stimulation;DME Instruction;Moist Heat;Gait training;Stair training;Functional mobility training;Therapeutic activities;Therapeutic exercise;Balance training;Patient/family education;Neuromuscular re-education;Manual techniques;Taping;Passive range of motion    PT Next Visit Plan  LE hip extension strengthening, wall squats with theraband, hurdles, cone weaving, uneven surface,Gait with knee brace to prevent hyperextension, high level balance and strengthening of R LE, Nustep.     PT Home Exercise Plan  [email or web address]    Consulted and Agree with Plan of Care  Patient       Patient will benefit from skilled therapeutic intervention in order to improve the following deficits and  impairments:  Abnormal gait, Decreased activity tolerance, Decreased balance, Decreased safety awareness, Difficulty walking, Decreased strength, Decreased mobility, Decreased coordination, Pain, Impaired sensation  Visit Diagnosis: Unsteadiness on feet  Other abnormalities of gait and mobility  Muscle weakness (generalized)     Problem List Patient Active Problem List   Diagnosis Date Noted  . Debility   . Anemia of chronic disease   . Chronic pain syndrome   . Hypokalemia   . Acute lower UTI 09/04/2017  . L3 vertebral fracture ([place]) 09/01/2017  . Constipation due to pain medication   . Muscle spasms of both lower extremities   . Type 2 diabetes mellitus with peripheral neuropathy (HCC)   . Hypoalbuminemia due to protein-calorie malnutrition ([place])   . Radiculopathy 07/28/2017  . Lumbosacral spondylosis with radiculopathy 07/26/2017  . Paraplegia, incomplete ([place]) 01/07/2017  . 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/27/2016  . Myelopathy ([place]) 12/21/2016  . Neuropathic pain   . 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 Paraparesis ([place]) 12/15/2016  . Type 2 diabetes mellitus without complication, without long-term current use of insulin ([place]) 11/02/2016  . Hyperlipidemia 11/02/2016  . Essential hypertension 11/02/2016  . 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    Floreen Comber, SPT 06/14/2018, 12:13 PM  [facility] [address] [place], [place], [address] Phone: [phone]   Fax:  [phone]  Name: Megan [name] MRN: [id number] Date of Birth: 07/01/1951

## 2018-06-16 ENCOUNTER — Ambulatory Visit: Payer: PPO | Admitting: Physical Therapy

## 2018-06-16 ENCOUNTER — Encounter: Payer: Self-pay | Admitting: Physical Therapy

## 2018-06-16 DIAGNOSIS — M6281 Muscle weakness (generalized): Secondary | ICD-10-CM

## 2018-06-16 DIAGNOSIS — R2681 Unsteadiness on feet: Secondary | ICD-10-CM | POA: Diagnosis not present

## 2018-06-16 DIAGNOSIS — R2689 Other abnormalities of gait and mobility: Secondary | ICD-10-CM

## 2018-06-16 NOTE — Therapy (Signed)
[facility] [address] [place], [place], [address] Phone: [phone]   Fax:  [phone]  Physical Therapy Treatment  Patient Details  Name: Megan [name] MRN: [id number] Date of Birth: [birth year] Referring Provider (PT): Dr. Vertell Limber   Encounter Date: 06/16/2018  PT End of Session - 06/16/18 1107    Visit Number  8    Number of Visits  27    Date for PT Re-Evaluation  08/21/18    Authorization Type  Medicare    PT Start Time  1104    PT Stop Time  1145    PT Time Calculation (min)  41 min    Activity Tolerance  Patient tolerated treatment well    Behavior During Therapy  [facility] for tasks assessed/performed       Past Medical History:  Diagnosis Date  . Arthritis    "hands, back" (07/26/2017)  . Dyspnea    W/ PHYS CONDITION   . GERD (gastroesophageal reflux disease)   . History of bronchitis    "not since I quit smoking" (07/26/2017)  . History of kidney stones   . History of shingles   . Hyperlipidemia    takes Fish Oil daily  . Hypertension   . Neuromuscular disorder (HCC)    tingling toes  . 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[place]) 12/15/2016  . Pneumonia 2009  . PONV (postoperative nausea and vomiting)   . Restless leg   . Type 2 diabetes mellitus ([place])   . Uterine cancer ([place]) 1979   S/P hysterectomy  . Weakness    numbness and tingling in both feet r/t back    Past Surgical History:  Procedure Laterality Date  . ABDOMINAL EXPOSURE N/A 07/26/2017   Procedure: ABDOMINAL EXPOSURE;  Surgeon: Angelia Mould, MD;  Location: [facility];  Service: Vascular;  Laterality: N/A;  . ANTERIOR CERVICAL DECOMP/DISCECTOMY FUSION  2001  . ANTERIOR LUMBAR FUSION N/A 07/26/2017   Procedure: Lumbar five-Sacral one Anterior lumbar interbody fusion with Dr. Deitra Mayo for approach;  Surgeon: Ditty, Kevan Ny, MD;  Location: [facility];  Service: Neurosurgery;  Laterality: N/A;  . APPLICATION OF ROBOTIC  ASSISTANCE FOR SPINAL PROCEDURE  12/19/2016   Procedure: APPLICATION OF ROBOTIC ASSISTANCE FOR SPINAL PROCEDURE;  Surgeon: Kevan Ny Ditty, MD;  Location: [facility];  Service: Neurosurgery;;  . APPLICATION OF ROBOTIC ASSISTANCE FOR SPINAL PROCEDURE N/A 07/26/2017   Procedure: APPLICATION OF ROBOTIC ASSISTANCE FOR SPINAL PROCEDURE;  Surgeon: Ditty, Kevan Ny, MD;  Location: [facility];  Service: Neurosurgery;  Laterality: N/A;  . APPLICATION OF ROBOTIC ASSISTANCE FOR SPINAL PROCEDURE N/A 09/02/2017   Procedure: APPLICATION OF ROBOTIC ASSISTANCE FOR SPINAL PROCEDURE;  Surgeon: Ditty, Kevan Ny, MD;  Location: [facility];  Service: Neurosurgery;  Laterality: N/A;  . BACK SURGERY    . BLADDER SUSPENSION  1991   tack  . INGUINAL HERNIA REPAIR Right 1991  . LUMBAR FUSION  2016; 2017; 07/26/2017   L4-5; L2-3; L5-S1  . POSTERIOR LUMBAR FUSION 4 WITH HARDWARE REMOVAL N/A 09/02/2017   Procedure: Lumbar three-four redo laminectomy; Repositioning of Left Sacral two screw; Extension of lumbar fusion to Thoracic twelve;  Surgeon: Ditty, Kevan Ny, MD;  Location: [facility];  Service: Neurosurgery;  Laterality: N/A;  . TONSILLECTOMY AND ADENOIDECTOMY  1959  . TUMOR EXCISION     WERTHIN'S TUMORS BOTH SIDES OF NECK  . VAGINAL HYSTERECTOMY  1979    There were no vitals filed for this visit.  Subjective Assessment - 06/16/18  1106    Subjective  has been doing balance work - EC are difficult; no falls    Patient is accompained by:  Family member   spouse   Pertinent History  HTN, DM2, left T10 costotransversectomy, T10-T11 laminectomy for decompression, pedicle screws, T9-T12 (4/18), L5-S1 anterior retroperitoneal exposure, L5-S1 laminectomy, L5-S1 posterior lateral fixation with fusion (11/18); chronic pain    Patient Stated Goals  improve balance and transition to use of cane    Currently in Pain?  No/denies                       [facility] Adult PT Treatment/Exercise - 06/16/18 0001       Exercises   Exercises  Knee/Hip    Other Exercises   seated scap retraction x 12 reps      Knee/Hip Exercises: Supine   Bridges  Strengthening;Both;15 reps    Bridges Limitations  green tband at knees    Other Supine Knee/Hip Exercises  supine clam - green tband x 12 each LE    Other Supine Knee/Hip Exercises  R bent knee raise x 8 - R sided throacic pain          Balance Exercises - 06/16/18 1241      Balance Exercises: Standing   Wall Bumps  Hip    Wall Bumps-Hips  Eyes opened;Anterior/posterior;15 reps    Step Over Hurdles / Cones  alternating height of hurdles - fwd stepping x 3 laps, lateral stepping x 2 laps - greatest difficulty with R foot clearance noting compensation of heavy UE use on // bars and up on tip toes of L foot    Other Standing Exercises  wall squat to ~30-45 degrees with PT bracing R knee due to patient fear of buckling x 12 reps           PT Short Term Goals - 06/09/18 1458      PT SHORT TERM GOAL #1   Title  patient to be independent with initial HEP for strength, balance and gait.     Time  6    Period  Weeks    Status  New      PT SHORT TERM GOAL #2   Title  Patient to demonstrate TUG with rollator to </= 16 seconds for reduced fall risk    Baseline  05/23/18 - 23.11 sec    Time  6    Period  Weeks    Status  New      PT SHORT TERM GOAL #3   Title  Patient to demonstrate 400' gait on level surfaces with rollator at Mod I without LOB or overt instability    Time  6    Period  Weeks    Status  New      PT SHORT TERM GOAL #4   Title  Patient to perform sit to stand x 10 with unilateral UE support with Mod I demonstrating improved strength and efficiency of transfers    Time  6    Period  Weeks    Status  New        PT Long Term Goals - 06/09/18 1458      PT LONG TERM GOAL #1   Title  Patient to be independent with final HEP for balance, strength, gait and other functional mobility    Time  13    Period  Weeks    Status  New       PT  LONG TERM GOAL #2   Title  patient to demonstrate gait with LRAD over various levels and surfaces for >/= 500' with MOD I without LOB or overt instability    Time  13    Period  Weeks    Status  New      PT LONG TERM GOAL #3   Title  Patient to improve TUG to </= 13 seconds with LRAD demonstrating reduced fall risk    Baseline  TUG rollator: 23.11 sec; TUG SBQC: 1.07 min    Time  13    Period  Weeks    Status  New      PT LONG TERM GOAL #4   Title  Patient to improve gait velocity with LRAD to at least 51ft/sec for decreased fall risk    Baseline  05/23/18: 1.8 ft/sec with rollator    Time  13    Period  Weeks    Status  New      PT LONG TERM GOAL #5   Title  Paitent to improve R LE strength by >/= 1 manual muscle grade demonstrating improved strength and functional use    Baseline  05/23/18: R LE grossly 3-/5    Time  13    Period  Weeks    Status  New            Plan - 06/16/18 1244    Clinical Impression Statement  Skilled session today focusing on improving glute/hip strength with verbal cueing for form of movement and general control. Patient requires R knee blocking during wall squat activity to prevent buckle. Hurdles today with greatest difficulty with foot clearance over high hurdles with compensations noted throughout to clear. Continues to make good progress towards goals.     Rehab Potential  Good    PT Frequency  2x / week    PT Duration  Other (comment)   13 weeks (90 days)   PT Treatment/Interventions  ADLs/Self Care Home Management;Cryotherapy;Electrical Stimulation;DME Instruction;Moist Heat;Gait training;Stair training;Functional mobility training;Therapeutic activities;Therapeutic exercise;Balance training;Patient/family education;Neuromuscular re-education;Manual techniques;Taping;Passive range of motion    PT Next Visit Plan  LE hip extension strengthening, wall squats with theraband, hurdles, cone weaving, uneven surface,Gait with knee brace to prevent  hyperextension, high level balance and strengthening of R LE, Nustep.     PT Home Exercise Plan  [email or web address]    Consulted and Agree with Plan of Care  Patient       Patient will benefit from skilled therapeutic intervention in order to improve the following deficits and impairments:  Abnormal gait, Decreased activity tolerance, Decreased balance, Decreased safety awareness, Difficulty walking, Decreased strength, Decreased mobility, Decreased coordination, Pain, Impaired sensation  Visit Diagnosis: Unsteadiness on feet  Other abnormalities of gait and mobility  Muscle weakness (generalized)     Problem List Patient Active Problem List   Diagnosis Date Noted  . Debility   . Anemia of chronic disease   . Chronic pain syndrome   . Hypokalemia   . Acute lower UTI 09/04/2017  . L3 vertebral fracture ([place]) 09/01/2017  . Constipation due to pain medication   . Muscle spasms of both lower extremities   . Type 2 diabetes mellitus with peripheral neuropathy (HCC)   . Hypoalbuminemia due to protein-calorie malnutrition ([place])   . Radiculopathy 07/28/2017  . Lumbosacral spondylosis with radiculopathy 07/26/2017  . Paraplegia, incomplete ([place]) 01/07/2017  . 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/27/2016  . Myelopathy ([place]) 12/21/2016  . Neuropathic pain   . Spondylogenic compression of thoracic  spinal cord 12/18/2016  . Paraparesis ([place]) 12/15/2016  . Type 2 diabetes mellitus without complication, without long-term current use of insulin ([place]) 11/02/2016  . Hyperlipidemia 11/02/2016  . Essential hypertension 11/02/2016  . 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     Lanney Gins, PT, DPT Supplemental Physical Therapist 06/16/18 12:53 PM Pager: [phone] Office: [facility] [address] [place], [place], [address] Phone: [phone]   Fax:   [phone]  Name: Megan [name] MRN: [id number] Date of Birth: 10-Aug-1951

## 2018-06-19 ENCOUNTER — Ambulatory Visit: Payer: PPO | Admitting: Physical Therapy

## 2018-06-19 ENCOUNTER — Encounter: Payer: Self-pay | Admitting: Physical Therapy

## 2018-06-19 DIAGNOSIS — M6281 Muscle weakness (generalized): Secondary | ICD-10-CM

## 2018-06-19 DIAGNOSIS — R2681 Unsteadiness on feet: Secondary | ICD-10-CM | POA: Diagnosis not present

## 2018-06-19 DIAGNOSIS — R2689 Other abnormalities of gait and mobility: Secondary | ICD-10-CM

## 2018-06-19 NOTE — Patient Instructions (Addendum)
Access Code: [email or web address]  URL: [email or web address]  Date: 06/19/2018  Prepared by: Floreen Comber   Exercises  Standing Balance in Corner with Eyes Closed - 10 reps - 3 sets - 30 hold - 1x daily - 7x weekly  Walking March - 10 reps - 3 sets - 1x daily - 7x weekly  Supine Hip Flexor Stretch with Weight - 3 reps - 1 sets - 30 hold - 1x daily - 7x weekly  Standing Tandem Balance with Counter Support - 10 reps - 3 sets - 30 hold - 1x daily - 5x weekly  Sit to Stand with Counter Support - 10 reps - 2 sets - 1x daily - 5x weekly  Supine Bridge with Resistance Band - 10 reps - 3 sets - 1x daily - 5x weekly  Side Stepping with Resistance at Thighs - 10 reps - 3 sets - 1x daily - 5x weekly  Seated Hamstring Stretch with Strap - 10 reps - 3 sets - 1x daily - 7x weekly

## 2018-06-19 NOTE — Therapy (Signed)
[facility] [address] [place], [place], [address] Phone: [phone]   Fax:  [phone]  Physical Therapy Treatment  Patient Details  Name: Megan [name] MRN: [id number] Date of Birth: Apr 15, 1951 Referring Provider (PT): Dr. Vertell Limber   Encounter Date: 06/19/2018  PT End of Session - 06/19/18 1637    Visit Number  9    Number of Visits  27    Date for PT Re-Evaluation  08/21/18    Authorization Type  Medicare    PT Start Time  1445    PT Stop Time  1530    PT Time Calculation (min)  45 min    Equipment Utilized During Treatment  Other (comment)   sweedish knee cage   Activity Tolerance  Patient tolerated treatment well    Behavior During Therapy  [facility] for tasks assessed/performed       Past Medical History:  Diagnosis Date  . Arthritis    "hands, back" (07/26/2017)  . Dyspnea    W/ PHYS CONDITION   . GERD (gastroesophageal reflux disease)   . History of bronchitis    "not since I quit smoking" (07/26/2017)  . History of kidney stones   . History of shingles   . Hyperlipidemia    takes Fish Oil daily  . Hypertension   . Neuromuscular disorder (HCC)    tingling toes  . 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[place]) 12/15/2016  . Pneumonia 2009  . PONV (postoperative nausea and vomiting)   . Restless leg   . Type 2 diabetes mellitus ([place])   . Uterine cancer ([place]) 1979   S/P hysterectomy  . Weakness    numbness and tingling in both feet r/t back    Past Surgical History:  Procedure Laterality Date  . ABDOMINAL EXPOSURE N/A 07/26/2017   Procedure: ABDOMINAL EXPOSURE;  Surgeon: Angelia Mould, MD;  Location: [facility];  Service: Vascular;  Laterality: N/A;  . ANTERIOR CERVICAL DECOMP/DISCECTOMY FUSION  2001  . ANTERIOR LUMBAR FUSION N/A 07/26/2017   Procedure: Lumbar five-Sacral one Anterior lumbar interbody fusion with Dr. Deitra Mayo for approach;  Surgeon: Ditty, Kevan Ny, MD;  Location:  [facility];  Service: Neurosurgery;  Laterality: N/A;  . APPLICATION OF ROBOTIC ASSISTANCE FOR SPINAL PROCEDURE  12/19/2016   Procedure: APPLICATION OF ROBOTIC ASSISTANCE FOR SPINAL PROCEDURE;  Surgeon: Kevan Ny Ditty, MD;  Location: [facility];  Service: Neurosurgery;;  . APPLICATION OF ROBOTIC ASSISTANCE FOR SPINAL PROCEDURE N/A 07/26/2017   Procedure: APPLICATION OF ROBOTIC ASSISTANCE FOR SPINAL PROCEDURE;  Surgeon: Ditty, Kevan Ny, MD;  Location: [facility];  Service: Neurosurgery;  Laterality: N/A;  . APPLICATION OF ROBOTIC ASSISTANCE FOR SPINAL PROCEDURE N/A 09/02/2017   Procedure: APPLICATION OF ROBOTIC ASSISTANCE FOR SPINAL PROCEDURE;  Surgeon: Ditty, Kevan Ny, MD;  Location: [facility];  Service: Neurosurgery;  Laterality: N/A;  . BACK SURGERY    . BLADDER SUSPENSION  1991   tack  . INGUINAL HERNIA REPAIR Right 1991  . LUMBAR FUSION  2016; 2017; 07/26/2017   L4-5; L2-3; L5-S1  . POSTERIOR LUMBAR FUSION 4 WITH HARDWARE REMOVAL N/A 09/02/2017   Procedure: Lumbar three-four redo laminectomy; Repositioning of Left Sacral two screw; Extension of lumbar fusion to Thoracic twelve;  Surgeon: Ditty, Kevan Ny, MD;  Location: [facility];  Service: Neurosurgery;  Laterality: N/A;  . TONSILLECTOMY AND ADENOIDECTOMY  1959  . TUMOR EXCISION     WERTHIN'S TUMORS BOTH SIDES OF NECK  . [place]  There were no vitals filed for this visit.  Subjective Assessment - 06/19/18 1448    Subjective  Pt reports she experienced tightness in the back of her R knee and groin region yesterday but feels better today.     Patient is accompained by:  Family member   spouse   Pertinent History  HTN, DM2, left T10 costotransversectomy, T10-T11 laminectomy for decompression, pedicle screws, T9-T12 (4/18), L5-S1 anterior retroperitoneal exposure, L5-S1 laminectomy, L5-S1 posterior lateral fixation with fusion (11/18); chronic pain    Patient Stated Goals  improve balance and transition to use of cane     Currently in Pain?  No/denies              [facility] Adult PT Treatment/Exercise - 06/19/18 1627      Transfers   Transfers  Sit to Stand;Stand to Sit    Sit to Stand  6: Modified independent (Device/Increase time)    Stand to Sit  6: Modified independent (Device/Increase time)      Ambulation/Gait   Ambulation/Gait  Yes    Ambulation/Gait Assistance  5: Supervision    Ambulation/Gait Assistance Details  Pt performs multiple ambulation trials with her rollator with knee cage applied to R LE to prevent hyperextension during terminal stance phase of gait. Pt performs multiple trials with incorporation of ambulation on level surfaces, head turns in all directions, varying gait speed when cued, cone weaving for obstacle negotiation, and compliant surfaces for increased challenge during functional task. Pt required verbal cues to maintain forward gaze, upright posture, and to increase step length to optimize gait pattern. Therapist provided supervision throughout activity duration.    Ambulation Distance (Feet)  500 Feet    Assistive device  4-wheeled walker   [place] knee cage to RLE to prevent hyperextension   Gait Pattern  Step-through pattern;Decreased stride length;Decreased stance time - right;Decreased step length - left;Decreased dorsiflexion - right;Decreased weight shift to right;Right genu recurvatum    Ambulation Surface  Level;Unlevel;Indoor      Balance   Balance Assessed  Yes      High Level Balance   High Level Balance Comments  Pt performs tandem walking with counter top support for x2 trials. Pt requires B UE's on counter top for increased stability progressing to single UE support when cued      Exercises   Exercises  Knee/Hip      Knee/Hip Exercises: Stretches   Passive Hamstring Stretch  Right;3 reps;30 seconds   Pt positioned in modified supine with wedge     Knee/Hip Exercises: Standing   Hip Abduction  Stengthening;Both;2 sets    Abduction Limitations  Pt  performs side stepping at the counter for UE support with red resistance band to distal thighs to increase glut med activation. Therapist provides multimodal cues for proper posture and technique. Pt demonstrates x1 R knee buckling episode but able to steady with UE assist.       Knee/Hip Exercises: Supine   Bridges  Strengthening;Both;2 sets    Bridges Limitations  red tband around distal thighs for second set to increase resistance. Pt positioned in modified supine with blue wedge.              PT Education - 06/19/18 1636    Education Details  Therapist provided education regarding updated HEP exercises with patient verbalizing and demonstrating correct understanding. Therapist provided education regarding adequate posture during ambulation trials to prevent excessive trunk flexion creating increased torque to lower back region.  Person(s) Educated  Patient    Methods  Explanation    Comprehension  Verbalized understanding       PT Short Term Goals - 06/19/18 2139      PT SHORT TERM GOAL #1   Title  patient to be independent with initial HEP for strength, balance and gait. (All STGs due 07/04/18)    Time  6    Period  Weeks    Status  On-going      PT SHORT TERM GOAL #2   Title  Patient to demonstrate TUG with rollator to </= 16 seconds for reduced fall risk    Baseline  05/23/18 - 23.11 sec    Time  6    Period  Weeks    Status  On-going      PT SHORT TERM GOAL #3   Title  Patient to demonstrate 400' gait on level surfaces with rollator at Mod I without LOB or overt instability    Time  6    Period  Weeks    Status  On-going      PT SHORT TERM GOAL #4   Title  Patient to perform sit to stand x 10 with unilateral UE support with Mod I demonstrating improved strength and efficiency of transfers    Time  6    Period  Weeks    Status  On-going        PT Long Term Goals - 06/19/18 2140      PT LONG TERM GOAL #1   Title  Patient to be independent with final HEP  for balance, strength, gait and other functional mobility. (All LTGs due 08/21/18)    Time  13    Period  Weeks    Status  On-going      PT LONG TERM GOAL #2   Title  patient to demonstrate gait with LRAD over various levels and surfaces for >/= 500' with MOD I without LOB or overt instability    Time  13    Period  Weeks    Status  On-going      PT LONG TERM GOAL #3   Title  Patient to improve TUG to </= 13 seconds with LRAD demonstrating reduced fall risk    Baseline  TUG rollator: 23.11 sec; TUG SBQC: 1.07 min    Time  13    Period  Weeks    Status  On-going      PT LONG TERM GOAL #4   Title  Patient to improve gait velocity with LRAD to at least 63ft/sec for decreased fall risk    Baseline  05/23/18: 1.8 ft/sec with rollator    Time  13    Period  Weeks    Status  On-going      PT LONG TERM GOAL #5   Title  Paitent to improve R LE strength by >/= 1 manual muscle grade demonstrating improved strength and functional use    Baseline  05/23/18: R LE grossly 3-/5    Time  13    Period  Weeks    Status  On-going        Plan - 06/19/18 1501    Clinical Impression Statement  Today's session focused on updating patient's HEP with incorporation of LE strengthening and stretching exercises to improve R LE flexibility with special consideration to reduce R knee stiffness which may be 2/2 to increased hamstring tightness. Pt verbalizes and demonstrates all updated HEP exercises with proper technique. Pt performs  multiple ambulation trials with her rollator and [place] knee cage applied to R LE to prevent hyperextension during stance phases of gait. Pt demonstrates observable improvement in confidence during gait trials with knee cage applied indicating it provides stability and reduces her fear of falling 2/2 to knee buckling episode. Pt will continue to benefit from LE strengthening, dynamic balance, and gait training to improve her independence with functional mobility.     Rehab Potential   Good    PT Frequency  2x / week    PT Duration  Other (comment)   13 weeks/90 days   PT Treatment/Interventions  ADLs/Self Care Home Management;Cryotherapy;Electrical Stimulation;DME Instruction;Moist Heat;Gait training;Stair training;Functional mobility training;Therapeutic activities;Therapeutic exercise;Balance training;Patient/family education;Neuromuscular re-education;Manual techniques;Taping;Passive range of motion    PT Next Visit Plan  10 visit progress note due next visit; STS from lower surface height LE hip extension strengthening, wall squats with theraband, hurdles, cone weaving, uneven surface,Gait with knee brace to prevent hyperextension, high level balance and strengthening of R LE, Nustep.     PT Home Exercise Plan  [email or web address]    Consulted and Agree with Plan of Care  Patient             Patient will benefit from skilled therapeutic intervention in order to improve the following deficits and impairments:     Visit Diagnosis: Unsteadiness on feet  Other abnormalities of gait and mobility  Muscle weakness (generalized)     Problem List Patient Active Problem List   Diagnosis Date Noted  . Debility   . Anemia of chronic disease   . Chronic pain syndrome   . Hypokalemia   . Acute lower UTI 09/04/2017  . L3 vertebral fracture ([place]) 09/01/2017  . Constipation due to pain medication   . Muscle spasms of both lower extremities   . Type 2 diabetes mellitus with peripheral neuropathy (HCC)   . Hypoalbuminemia due to protein-calorie malnutrition ([place])   . Radiculopathy 07/28/2017  . Lumbosacral spondylosis with radiculopathy 07/26/2017  . Paraplegia, incomplete ([place]) 01/07/2017  . 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/27/2016  . Myelopathy ([place]) 12/21/2016  . Neuropathic pain   . 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 Paraparesis ([place]) 12/15/2016  . Type 2 diabetes mellitus without complication, without long-term current use of insulin ([place]) 11/02/2016   . Hyperlipidemia 11/02/2016  . Essential hypertension 11/02/2016  . 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    Floreen Comber, SPT 06/19/2018, 16:37 PM  This note has been reviewed and edited by supervising CI.  [name], PTA, [facility] [address] [place], [place] [address] [phone] 06/19/18, 9:42 PM   Name: Megan [name] MRN: [id number] Date of Birth: [birth year]

## 2018-06-20 ENCOUNTER — Encounter: Payer: Self-pay | Admitting: Physical Therapy

## 2018-06-20 ENCOUNTER — Ambulatory Visit: Payer: PPO | Admitting: Physical Therapy

## 2018-06-20 ENCOUNTER — Telehealth: Payer: Self-pay | Admitting: Physical Therapy

## 2018-06-20 DIAGNOSIS — R2681 Unsteadiness on feet: Secondary | ICD-10-CM | POA: Diagnosis not present

## 2018-06-20 DIAGNOSIS — M6281 Muscle weakness (generalized): Secondary | ICD-10-CM

## 2018-06-20 DIAGNOSIS — R2689 Other abnormalities of gait and mobility: Secondary | ICD-10-CM

## 2018-06-20 NOTE — Therapy (Signed)
[facility] [address] [place], [place], [address] Phone: [phone]   Fax:  [phone]  Physical Therapy Treatment  Patient Details  Name: Megan [name] MRN: [id number] Date of Birth: Jul 02, 1951 Referring Provider (PT): Dr. Vertell Limber  Progress Note Reporting Period 05/23/18 to 06/20/18  See note below for Objective Data and Assessment of Progress/Goals.    Encounter Date: 06/20/2018  PT End of Session - 06/20/18 1452    Visit Number  10    Number of Visits  27    Date for PT Re-Evaluation  08/21/18    Authorization Type  Medicare    PT Start Time  1448    PT Stop Time  1529    PT Time Calculation (min)  41 min    Activity Tolerance  Patient tolerated treatment well    Behavior During Therapy  WFL for tasks assessed/performed       Past Medical History:  Diagnosis Date  . Arthritis    "hands, back" (07/26/2017)  . Dyspnea    W/ PHYS CONDITION   . GERD (gastroesophageal reflux disease)   . History of bronchitis    "not since I quit smoking" (07/26/2017)  . History of kidney stones   . History of shingles   . Hyperlipidemia    takes Fish Oil daily  . Hypertension   . Neuromuscular disorder (HCC)    tingling toes  . 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[facility]) 12/15/2016  . Pneumonia 2009  . PONV (postoperative nausea and vomiting)   . Restless leg   . Type 2 diabetes mellitus ([facility])   . Uterine cancer ([facility]) 1979   S/P hysterectomy  . Weakness    numbness and tingling in both feet r/t back    Past Surgical History:  Procedure Laterality Date  . ABDOMINAL EXPOSURE N/A 07/26/2017   Procedure: ABDOMINAL EXPOSURE;  Surgeon: Angelia Mould, MD;  Location: [facility];  Service: Vascular;  Laterality: N/A;  . ANTERIOR CERVICAL DECOMP/DISCECTOMY FUSION  2001  . ANTERIOR LUMBAR FUSION N/A 07/26/2017   Procedure: Lumbar five-Sacral one Anterior lumbar interbody fusion with Dr. Deitra Mayo for approach;   Surgeon: Ditty, Kevan Ny, MD;  Location: [facility];  Service: Neurosurgery;  Laterality: N/A;  . APPLICATION OF ROBOTIC ASSISTANCE FOR SPINAL PROCEDURE  12/19/2016   Procedure: APPLICATION OF ROBOTIC ASSISTANCE FOR SPINAL PROCEDURE;  Surgeon: Kevan Ny Ditty, MD;  Location: [facility];  Service: Neurosurgery;;  . APPLICATION OF ROBOTIC ASSISTANCE FOR SPINAL PROCEDURE N/A 07/26/2017   Procedure: APPLICATION OF ROBOTIC ASSISTANCE FOR SPINAL PROCEDURE;  Surgeon: Ditty, Kevan Ny, MD;  Location: [facility];  Service: Neurosurgery;  Laterality: N/A;  . APPLICATION OF ROBOTIC ASSISTANCE FOR SPINAL PROCEDURE N/A 09/02/2017   Procedure: APPLICATION OF ROBOTIC ASSISTANCE FOR SPINAL PROCEDURE;  Surgeon: Ditty, Kevan Ny, MD;  Location: [facility];  Service: Neurosurgery;  Laterality: N/A;  . BACK SURGERY    . BLADDER SUSPENSION  1991   tack  . INGUINAL HERNIA REPAIR Right 1991  . LUMBAR FUSION  2016; 2017; 07/26/2017   L4-5; L2-3; L5-S1  . POSTERIOR LUMBAR FUSION 4 WITH HARDWARE REMOVAL N/A 09/02/2017   Procedure: Lumbar three-four redo laminectomy; Repositioning of Left Sacral two screw; Extension of lumbar fusion to Thoracic twelve;  Surgeon: Ditty, Kevan Ny, MD;  Location: [facility];  Service: Neurosurgery;  Laterality: N/A;  . TONSILLECTOMY AND ADENOIDECTOMY  1959  . TUMOR EXCISION     WERTHIN'S TUMORS BOTH SIDES OF NECK  .  VAGINAL HYSTERECTOMY  1979    There were no vitals filed for this visit.  Subjective Assessment - 06/20/18 1450    Subjective  doing well - yesterdays session went well. Able to tie shoes for herself today for first time.     Pertinent History  HTN, DM2, left T10 costotransversectomy, T10-T11 laminectomy for decompression, pedicle screws, T9-T12 (4/18), L5-S1 anterior retroperitoneal exposure, L5-S1 laminectomy, L5-S1 posterior lateral fixation with fusion (11/18); chronic pain    Patient Stated Goals  improve balance and transition to use of cane    Currently in Pain?   No/denies    Pain Score  0-No pain                       [facility] Adult PT Treatment/Exercise - 06/20/18 0001      Ambulation/Gait   Ambulation/Gait  Yes    Ambulation/Gait Assistance  5: Supervision    Ambulation/Gait Assistance Details  ambulation with rollator around gym x 3 - noted hyperextension throughout stance phase of gait; supervision to Mod I throughout for safety with cueing for upright posture and forward gaze    Ambulation Distance (Feet)  400 Feet    Assistive device  Rollator    Gait Pattern  Step-through pattern;Decreased stride length;Decreased stance time - right;Decreased step length - left;Decreased dorsiflexion - right;Decreased weight shift to right;Right genu recurvatum    Ambulation Surface  Level;Indoor      Exercises   Exercises  Knee/Hip      Knee/Hip Exercises: Standing   Hip Flexion  Right;15 reps;Knee straight    Hip Flexion Limitations  red tband with light UE support    Hip Abduction  Stengthening;Right;15 reps;Knee straight    Abduction Limitations  red tband with light UE support    Hip Extension  Right;Knee straight;15 reps    Extension Limitations  red tband with light UE support      Knee/Hip Exercises: Seated   Sit to Sand  --   7 reps from arm chair - 1 UE support         Balance Exercises - 06/20/18 1618      Balance Exercises: Standing   Step Over Hurdles / Cones  low hurdles focusing on foot clearance without hip compensations - requires B UE support at // bars - fwd x 4 reps, lateral x 2 reps bilaterally        PT Education - 06/20/18 1605    Education Details  discussion on updated POC for hopeful acquiring of brace - sending note to MD    Person(s) Educated  Patient    Methods  Explanation    Comprehension  Verbalized understanding       PT Short Term Goals - 06/20/18 1453      PT SHORT TERM GOAL #1   Title  patient to be independent with initial HEP for strength, balance and gait. (All STGs due 07/04/18)     Time  6    Period  Weeks    Status  Achieved      PT SHORT TERM GOAL #2   Title  Patient to demonstrate TUG with rollator to </= 16 seconds for reduced fall risk    Baseline  05/23/18 - 23.11 sec; 06/20/18: 19.56    Time  6    Period  Weeks    Status  On-going      PT SHORT TERM GOAL #3   Title  Patient to demonstrate 400' gait on  level surfaces with rollator at Mod I without LOB or overt instability    Time  6    Period  Weeks    Status  Partially Met      PT SHORT TERM GOAL #4   Title  Patient to perform sit to stand x 10 with unilateral UE support with Mod I demonstrating improved strength and efficiency of transfers    Baseline  06/20/18: 7 x at SUP level before fatigue limited    Time  6    Period  Weeks    Status  On-going        PT Long Term Goals - 06/19/18 2140      PT LONG TERM GOAL #1   Title  Patient to be independent with final HEP for balance, strength, gait and other functional mobility. (All LTGs due 08/21/18)    Time  13    Period  Weeks    Status  On-going      PT LONG TERM GOAL #2   Title  patient to demonstrate gait with LRAD over various levels and surfaces for >/= 500' with MOD I without LOB or overt instability    Time  13    Period  Weeks    Status  On-going      PT LONG TERM GOAL #3   Title  Patient to improve TUG to </= 13 seconds with LRAD demonstrating reduced fall risk    Baseline  TUG rollator: 23.11 sec; TUG SBQC: 1.07 min    Time  13    Period  Weeks    Status  On-going      PT LONG TERM GOAL #4   Title  Patient to improve gait velocity with LRAD to at least 90f/sec for decreased fall risk    Baseline  05/23/18: 1.8 ft/sec with rollator    Time  13    Period  Weeks    Status  On-going      PT LONG TERM GOAL #5   Title  Paitent to improve R LE strength by >/= 1 manual muscle grade demonstrating improved strength and functional use    Baseline  05/23/18: R LE grossly 3-/5    Time  13    Period  Weeks    Status  On-going             Plan - 06/20/18 1507    Clinical Impression Statement  Todays session focusing on updating goals and continued progression of gait and balance activities. Patient with improved TUG time, ability to perform consecutive sit to stand from low surfaces, as well as improved mechanics and safety with gait. Addition of orthotic fit/training on POC today as patient would likely benefit from swedish knee cage as she demonstrates heave R knee hyperextension throughout stance phase of gait. Patient making steady progress towards LTGs with subjective reports of improve functional mobility since starting PT - patient able to tie shoes for first time today independently. Will plan to continue POC at 2x/week for continued skilled focus on LE strengthening, dynamic/high level balance, and gait training to improve safe functional mobility and reduce fall risk.     Rehab Potential  Good    PT Frequency  2x / week    PT Duration  Other (comment)   13 weeks/90 days   PT Treatment/Interventions  ADLs/Self Care Home Management;Cryotherapy;Electrical Stimulation;DME Instruction;Moist Heat;Gait training;Stair training;Functional mobility training;Therapeutic activities;Therapeutic exercise;Balance training;Patient/family education;Neuromuscular re-education;Manual techniques;Taping;Passive range of motion;Orthotic Fit/Training    PT Next  Visit Plan   STS from lower surface height LE hip extension strengthening, wall squats with theraband, hurdles, cone weaving, uneven surface,Gait with knee brace to prevent hyperextension, high level balance and strengthening of R LE, Nustep.     PT Home Exercise Plan  [id number]    Consulted and Agree with Plan of Care  Patient       Patient will benefit from skilled therapeutic intervention in order to improve the following deficits and impairments:  Abnormal gait, Decreased activity tolerance, Decreased balance, Decreased safety awareness, Difficulty walking, Decreased  strength, Decreased mobility, Decreased coordination, Pain, Impaired sensation  Visit Diagnosis: Unsteadiness on feet  Other abnormalities of gait and mobility  Muscle weakness (generalized)     Problem List Patient Active Problem List   Diagnosis Date Noted  . Debility   . Anemia of chronic disease   . Chronic pain syndrome   . Hypokalemia   . Acute lower UTI 09/04/2017  . L3 vertebral fracture ([facility]) 09/01/2017  . Constipation due to pain medication   . Muscle spasms of both lower extremities   . Type 2 diabetes mellitus with peripheral neuropathy (HCC)   . Hypoalbuminemia due to protein-calorie malnutrition ([facility])   . Radiculopathy 07/28/2017  . Lumbosacral spondylosis with radiculopathy 07/26/2017  . Paraplegia, incomplete ([facility]) 01/07/2017  . 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/27/2016  . Myelopathy ([facility]) 12/21/2016  . Neuropathic pain   . 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 Paraparesis ([facility]) 12/15/2016  . Type 2 diabetes mellitus without complication, without long-term current use of insulin ([facility]) 11/02/2016  . Hyperlipidemia 11/02/2016  . Essential hypertension 11/02/2016  . 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    Lanney Gins, PT, DPT Supplemental Physical Therapist 06/20/18 4:23 PM Pager: [phone] Office: [facility] [address] [place], [place], [address] Phone: [phone]   Fax:  [phone]  Name: ANTOINETTA BERRONES MRN: [id number] Date of Birth: [birth year]

## 2018-06-20 NOTE — Telephone Encounter (Signed)
Dr. Vertell Limber,   I am seeing Megan [name] in Outpatient PT. We have trialed a swedish knee cage for her R LE due to excessive R knee hyperextension in stance phase of gait and she does really well with it. Could you please put an order through Epic for this device. Or fax to [phone].  Thanks! Lanney Gins, PT, DPT Supplemental Physical Therapist 06/20/18 4:27 PM Pager: [phone] Office: [phone]

## 2018-06-28 ENCOUNTER — Ambulatory Visit: Payer: PPO | Admitting: Physical Therapy

## 2018-06-28 ENCOUNTER — Encounter: Payer: Self-pay | Admitting: Physical Therapy

## 2018-06-28 DIAGNOSIS — R2681 Unsteadiness on feet: Secondary | ICD-10-CM

## 2018-06-28 DIAGNOSIS — M6281 Muscle weakness (generalized): Secondary | ICD-10-CM

## 2018-06-28 DIAGNOSIS — R2689 Other abnormalities of gait and mobility: Secondary | ICD-10-CM

## 2018-06-29 NOTE — Therapy (Signed)
[facility] [address] [place], [place], [address] Phone: [phone]   Fax:  [phone]  Physical Therapy Treatment  Patient Details  Name: Megan [name] MRN: [id number] Date of Birth: [birth year] Referring Provider (PT): Dr. Macarthur Critchley Date: 06/28/2018  PT End of Session - 06/28/18 1152    Visit Number  11    Number of Visits  27    Date for PT Re-Evaluation  08/21/18    Authorization Type  Medicare    PT Start Time  1771    PT Stop Time  1232    PT Time Calculation (min)  43 min    Equipment Utilized During Treatment  Gait belt;Other (comment)   swedish knee cage   Activity Tolerance  Patient tolerated treatment well    Behavior During Therapy  WFL for tasks assessed/performed       Past Medical History:  Diagnosis Date  . Arthritis    "hands, back" (07/26/2017)  . Dyspnea    W/ PHYS CONDITION   . GERD (gastroesophageal reflux disease)   . History of bronchitis    "not since I quit smoking" (07/26/2017)  . History of kidney stones   . History of shingles   . Hyperlipidemia    takes Fish Oil daily  . Hypertension   . Neuromuscular disorder (HCC)    tingling toes  . 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[facility]) 12/15/2016  . Pneumonia 2009  . PONV (postoperative nausea and vomiting)   . Restless leg   . Type 2 diabetes mellitus ([facility])   . Uterine cancer ([facility]) 1979   S/P hysterectomy  . Weakness    numbness and tingling in both feet r/t back    Past Surgical History:  Procedure Laterality Date  . ABDOMINAL EXPOSURE N/A 07/26/2017   Procedure: ABDOMINAL EXPOSURE;  Surgeon: Angelia Mould, MD;  Location: [facility];  Service: Vascular;  Laterality: N/A;  . ANTERIOR CERVICAL DECOMP/DISCECTOMY FUSION  2001  . ANTERIOR LUMBAR FUSION N/A 07/26/2017   Procedure: Lumbar five-Sacral one Anterior lumbar interbody fusion with Dr. Deitra Mayo for approach;  Surgeon: Ditty, Kevan Ny, MD;   Location: [facility];  Service: Neurosurgery;  Laterality: N/A;  . APPLICATION OF ROBOTIC ASSISTANCE FOR SPINAL PROCEDURE  12/19/2016   Procedure: APPLICATION OF ROBOTIC ASSISTANCE FOR SPINAL PROCEDURE;  Surgeon: Kevan Ny Ditty, MD;  Location: [facility];  Service: Neurosurgery;;  . APPLICATION OF ROBOTIC ASSISTANCE FOR SPINAL PROCEDURE N/A 07/26/2017   Procedure: APPLICATION OF ROBOTIC ASSISTANCE FOR SPINAL PROCEDURE;  Surgeon: Ditty, Kevan Ny, MD;  Location: [facility];  Service: Neurosurgery;  Laterality: N/A;  . APPLICATION OF ROBOTIC ASSISTANCE FOR SPINAL PROCEDURE N/A 09/02/2017   Procedure: APPLICATION OF ROBOTIC ASSISTANCE FOR SPINAL PROCEDURE;  Surgeon: Ditty, Kevan Ny, MD;  Location: [facility];  Service: Neurosurgery;  Laterality: N/A;  . BACK SURGERY    . BLADDER SUSPENSION  1991   tack  . INGUINAL HERNIA REPAIR Right 1991  . LUMBAR FUSION  2016; 2017; 07/26/2017   L4-5; L2-3; L5-S1  . POSTERIOR LUMBAR FUSION 4 WITH HARDWARE REMOVAL N/A 09/02/2017   Procedure: Lumbar three-four redo laminectomy; Repositioning of Left Sacral two screw; Extension of lumbar fusion to Thoracic twelve;  Surgeon: Ditty, Kevan Ny, MD;  Location: [facility];  Service: Neurosurgery;  Laterality: N/A;  . TONSILLECTOMY AND ADENOIDECTOMY  1959  . TUMOR EXCISION     WERTHIN'S TUMORS BOTH SIDES OF NECK  . [place]  There were no vitals filed for this visit.  Subjective Assessment - 06/28/18 1151    Subjective  No new complaitns. No falls. Does report right flank pain with twisting toward left side with brushing teeth, last for less than a mintue and only pain she has.     Patient is accompained by:  Family member   spouse   Pertinent History  HTN, DM2, left T10 costotransversectomy, T10-T11 laminectomy for decompression, pedicle screws, T9-T12 (4/18), L5-S1 anterior retroperitoneal exposure, L5-S1 laminectomy, L5-S1 posterior lateral fixation with fusion (11/18); chronic pain    Limitations   Standing;Walking    How long can you walk comfortably?  household distances, limited community ambulation with RW vs rollator    Patient Stated Goals  improve balance and transition to use of cane    Currently in Pain?  No/denies    Pain Score  0-No pain         06/28/18 1158  Transfers  Transfers Sit to Stand;Stand to Sit  Sit to Stand 6: Modified independent (Device/Increase time)  Stand to Sit 6: Modified independent (Device/Increase time)  Ambulation/Gait  Ambulation/Gait Yes  Ambulation/Gait Assistance 5: Supervision  Ambulation/Gait Assistance Details verbal/tactile cues for upright posture and to keep rollator closer with gait. pt's rollator found to be way to high. Attempted to lower it however her spouse has drilled holes to make the handles higher and the screws in it now do not fit into the regular holes. They were under the impression that the higher it was the taller she would stand. Provided them both edcuation on proper arm position to keep shoulers down with slight elbow flexion to allow for more efficient muscle use of arms with gait. They are to lower it down when they get home.   Ambulation Distance (Feet) 400 Feet (x1, plus around gym)  Assistive device Rollator  Gait Pattern Step-through pattern;Decreased stride length;Decreased stance time - right;Decreased step length - left;Decreased dorsiflexion - right;Decreased weight shift to right;Right genu recurvatum  Ambulation Surface Level;Indoor  High Level Balance  High Level Balance Activities Side stepping;Marching forwards;Marching backwards  High Level Balance Comments in parallel bars: 3 laps each/each way with emphasis on light UE support on bars for balance, weight shifting, posture and ex form. min guard to min assist for balance.      06/28/18 1228  Balance Exercises: Standing  Standing Eyes Closed Wide (BOA);Foam/compliant surface;2 reps;20 secs;Limitations  Balance Beam standing across red beam:  alternating fwd heel taps to floor/back onto beam, then alternating bwd toe taps to floor/back onto beam. cues for step length/step height and weight shifting.  min guard to min assist for balance.   Balance Exercises: Standing  Standing Eyes Closed Limitations standing across red beam: with feet hip width apart- EC no head movements, progressing to EC with head movements left<>right, then up<>down with min assist for balance, occasional touch to bars.         PT Short Term Goals - 06/20/18 1453      PT SHORT TERM GOAL #1   Title  patient to be independent with initial HEP for strength, balance and gait. (All STGs due 07/04/18)    Time  6    Period  Weeks    Status  Achieved      PT SHORT TERM GOAL #2   Title  Patient to demonstrate TUG with rollator to </= 16 seconds for reduced fall risk    Baseline  05/23/18 - 23.11 sec; 06/20/18: 19.56  Time  6    Period  Weeks    Status  On-going      PT SHORT TERM GOAL #3   Title  Patient to demonstrate 400' gait on level surfaces with rollator at Mod I without LOB or overt instability    Time  6    Period  Weeks    Status  Partially Met      PT SHORT TERM GOAL #4   Title  Patient to perform sit to stand x 10 with unilateral UE support with Mod I demonstrating improved strength and efficiency of transfers    Baseline  06/20/18: 7 x at SUP level before fatigue limited    Time  6    Period  Weeks    Status  On-going        PT Long Term Goals - 06/19/18 2140      PT LONG TERM GOAL #1   Title  Patient to be independent with final HEP for balance, strength, gait and other functional mobility. (All LTGs due 08/21/18)    Time  13    Period  Weeks    Status  On-going      PT LONG TERM GOAL #2   Title  patient to demonstrate gait with LRAD over various levels and surfaces for >/= 500' with MOD I without LOB or overt instability    Time  13    Period  Weeks    Status  On-going      PT LONG TERM GOAL #3   Title  Patient to improve TUG  to </= 13 seconds with LRAD demonstrating reduced fall risk    Baseline  TUG rollator: 23.11 sec; TUG SBQC: 1.07 min    Time  13    Period  Weeks    Status  On-going      PT LONG TERM GOAL #4   Title  Patient to improve gait velocity with LRAD to at least 20f/sec for decreased fall risk    Baseline  05/23/18: 1.8 ft/sec with rollator    Time  13    Period  Weeks    Status  On-going      PT LONG TERM GOAL #5   Title  Paitent to improve R LE strength by >/= 1 manual muscle grade demonstrating improved strength and functional use    Baseline  05/23/18: R LE grossly 3-/5    Time  13    Period  Weeks    Status  On-going         06/28/18 1153  Plan  Clinical Impression Statement Today's skilled session continued to focus on gait quality with use of swedish knee cage with rollator and on balance reactions. No issues reported in session. Pt continues to need cues for more upright posture (trunk extension) with gait. Pt also continues to be challenged by compliant surfaces and with vision removed. The pt is making progress toward goals and should benefit from continued PT to progress toward unmet goals.   Pt will benefit from skilled therapeutic intervention in order to improve on the following deficits Abnormal gait;Decreased activity tolerance;Decreased balance;Decreased safety awareness;Difficulty walking;Decreased strength;Decreased mobility;Decreased coordination;Pain;Impaired sensation  Rehab Potential Good  PT Frequency 2x / week  PT Duration Other (comment) (13 weeks/90 days)  PT Treatment/Interventions ADLs/Self Care Home Management;Cryotherapy;Electrical Stimulation;DME Instruction;Moist Heat;Gait training;Stair training;Functional mobility training;Therapeutic activities;Therapeutic exercise;Balance training;Patient/family education;Neuromuscular re-education;Manual techniques;Taping;Passive range of motion;Orthotic Fit/Training  PT Next Visit Plan STGs due on 07/04/18, will need to be  updated as LTGS due in December; continue to work on core strengthening/thoracic spine strengthening for improved posture, continue to work on high level balance activites/right LE strengthening; check to see if order for knee cage came in- may need to submit to [facility] MD or paper order if not.   PT Home Exercise Plan [id number]  Consulted and Agree with Plan of Care Patient          Patient will benefit from skilled therapeutic intervention in order to improve the following deficits and impairments:  Abnormal gait, Decreased activity tolerance, Decreased balance, Decreased safety awareness, Difficulty walking, Decreased strength, Decreased mobility, Decreased coordination, Pain, Impaired sensation  Visit Diagnosis: Unsteadiness on feet  Other abnormalities of gait and mobility  Muscle weakness (generalized)     Problem List Patient Active Problem List   Diagnosis Date Noted  . Debility   . Anemia of chronic disease   . Chronic pain syndrome   . Hypokalemia   . Acute lower UTI 09/04/2017  . L3 vertebral fracture ([facility]) 09/01/2017  . Constipation due to pain medication   . Muscle spasms of both lower extremities   . Type 2 diabetes mellitus with peripheral neuropathy (HCC)   . Hypoalbuminemia due to protein-calorie malnutrition ([facility])   . Radiculopathy 07/28/2017  . Lumbosacral spondylosis with radiculopathy 07/26/2017  . Paraplegia, incomplete ([facility]) 01/07/2017  . 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/27/2016  . Myelopathy ([facility]) 12/21/2016  . Neuropathic pain   . 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 Paraparesis ([facility]) 12/15/2016  . Type 2 diabetes mellitus without complication, without long-term current use of insulin ([facility]) 11/02/2016  . Hyperlipidemia 11/02/2016  . Essential hypertension 11/02/2016  . 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   [name], PTA, [facility] [address] [place], [place] [address] [phone] 06/29/18, 8:38 PM   Name: Megan [name] MRN: [id number] Date of Birth: March 22, 1951

## 2018-06-30 ENCOUNTER — Encounter: Payer: Self-pay | Admitting: Physical Therapy

## 2018-06-30 ENCOUNTER — Ambulatory Visit: Payer: PPO | Admitting: Physical Therapy

## 2018-06-30 DIAGNOSIS — R2689 Other abnormalities of gait and mobility: Secondary | ICD-10-CM

## 2018-06-30 DIAGNOSIS — R2681 Unsteadiness on feet: Secondary | ICD-10-CM

## 2018-06-30 DIAGNOSIS — M6281 Muscle weakness (generalized): Secondary | ICD-10-CM

## 2018-06-30 NOTE — Therapy (Signed)
[facility] [address] [place], [place], [address] Phone: [phone]   Fax:  [phone]  Physical Therapy Treatment  Patient Details  Name: Megan [name] MRN: [id number] Date of Birth: [birth year] Referring Provider (PT): Dr. Macarthur Critchley Date: 06/30/2018  PT End of Session - 06/30/18 1702    Visit Number  12    Number of Visits  27    Date for PT Re-Evaluation  08/21/18    Authorization Type  Medicare    PT Start Time  1740    PT Stop Time  1532    PT Time Calculation (min)  41 min    Activity Tolerance  Patient tolerated treatment well    Behavior During Therapy  [facility] for tasks assessed/performed       Past Medical History:  Diagnosis Date  . Arthritis    "hands, back" (07/26/2017)  . Dyspnea    W/ PHYS CONDITION   . GERD (gastroesophageal reflux disease)   . History of bronchitis    "not since I quit smoking" (07/26/2017)  . History of kidney stones   . History of shingles   . Hyperlipidemia    takes Fish Oil daily  . Hypertension   . Neuromuscular disorder (HCC)    tingling toes  . 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[place]) 12/15/2016  . Pneumonia 2009  . PONV (postoperative nausea and vomiting)   . Restless leg   . Type 2 diabetes mellitus ([place])   . Uterine cancer ([place]) 1979   S/P hysterectomy  . Weakness    numbness and tingling in both feet r/t back    Past Surgical History:  Procedure Laterality Date  . ABDOMINAL EXPOSURE N/A 07/26/2017   Procedure: ABDOMINAL EXPOSURE;  Surgeon: Angelia Mould, MD;  Location: [facility];  Service: Vascular;  Laterality: N/A;  . ANTERIOR CERVICAL DECOMP/DISCECTOMY FUSION  2001  . ANTERIOR LUMBAR FUSION N/A 07/26/2017   Procedure: Lumbar five-Sacral one Anterior lumbar interbody fusion with Dr. Deitra Mayo for approach;  Surgeon: Ditty, Kevan Ny, MD;  Location: [facility];  Service: Neurosurgery;  Laterality: N/A;  . APPLICATION OF ROBOTIC  ASSISTANCE FOR SPINAL PROCEDURE  12/19/2016   Procedure: APPLICATION OF ROBOTIC ASSISTANCE FOR SPINAL PROCEDURE;  Surgeon: Kevan Ny Ditty, MD;  Location: [facility];  Service: Neurosurgery;;  . APPLICATION OF ROBOTIC ASSISTANCE FOR SPINAL PROCEDURE N/A 07/26/2017   Procedure: APPLICATION OF ROBOTIC ASSISTANCE FOR SPINAL PROCEDURE;  Surgeon: Ditty, Kevan Ny, MD;  Location: [place];  Service: Neurosurgery;  Laterality: N/A;  . APPLICATION OF ROBOTIC ASSISTANCE FOR SPINAL PROCEDURE N/A 09/02/2017   Procedure: APPLICATION OF ROBOTIC ASSISTANCE FOR SPINAL PROCEDURE;  Surgeon: Ditty, Kevan Ny, MD;  Location: [facility];  Service: Neurosurgery;  Laterality: N/A;  . BACK SURGERY    . BLADDER SUSPENSION  1991   tack  . INGUINAL HERNIA REPAIR Right 1991  . LUMBAR FUSION  2016; 2017; 07/26/2017   L4-5; L2-3; L5-S1  . POSTERIOR LUMBAR FUSION 4 WITH HARDWARE REMOVAL N/A 09/02/2017   Procedure: Lumbar three-four redo laminectomy; Repositioning of Left Sacral two screw; Extension of lumbar fusion to Thoracic twelve;  Surgeon: Ditty, Kevan Ny, MD;  Location: [facility];  Service: Neurosurgery;  Laterality: N/A;  . TONSILLECTOMY AND ADENOIDECTOMY  1959  . TUMOR EXCISION     WERTHIN'S TUMORS BOTH SIDES OF NECK  . VAGINAL HYSTERECTOMY  1979    There were no vitals filed for this visit.  Subjective Assessment - 06/30/18  1454    Subjective  lowered rollator - not having a difficult time with this    Patient is accompained by:  Family member   husband   Pertinent History  HTN, DM2, left T10 costotransversectomy, T10-T11 laminectomy for decompression, pedicle screws, T9-T12 (4/18), L5-S1 anterior retroperitoneal exposure, L5-S1 laminectomy, L5-S1 posterior lateral fixation with fusion (11/18); chronic pain    Patient Stated Goals  improve balance and transition to use of cane    Currently in Pain?  No/denies    Pain Score  0-No pain                       [facility] Adult PT  Treatment/Exercise - 06/30/18 0001      Exercises   Other Exercises   heel sitting to tall kneeling with k-bench x 15; tall kneeling ball rollouts (green physioball) x 12       Knee/Hip Exercises: Stretches   Other Knee/Hip Stretches  gastroc stretch with foot propped on 2 inch step with forward weight shift      Knee/Hip Exercises: Standing   Hip Flexion  Right;15 reps;Knee straight;Left    Hip Flexion Limitations  red tband with light UE support    Hip Abduction  Stengthening;Right;15 reps;Knee straight;Left    Abduction Limitations  red tband with light UE support    Hip Extension  Right;Knee straight;15 reps;Left    Extension Limitations  red tband with light UE support               PT Short Term Goals - 06/29/18 2057      PT SHORT TERM GOAL #1   Title  patient to be independent with initial HEP for strength, balance and gait. (All STGs due 07/04/18)    Time  6    Period  Weeks    Status  Achieved      PT SHORT TERM GOAL #2   Title  Patient to demonstrate TUG with rollator to </= 16 seconds for reduced fall risk    Baseline  05/23/18 - 23.11 sec; 06/20/18: 19.56    Time  6    Period  Weeks    Status  On-going      PT SHORT TERM GOAL #3   Title  Patient to demonstrate 400' gait on level surfaces with rollator at Mod I without LOB or overt instability    Time  6    Period  Weeks    Status  Partially Met      PT SHORT TERM GOAL #4   Title  Patient to perform sit to stand x 10 with unilateral UE support with Mod I demonstrating improved strength and efficiency of transfers    Baseline  06/20/18: 7 x at SUP level before fatigue limited    Time  6    Period  Weeks    Status  On-going        PT Long Term Goals - 06/19/18 2140      PT LONG TERM GOAL #1   Title  Patient to be independent with final HEP for balance, strength, gait and other functional mobility. (All LTGs due 08/21/18)    Time  13    Period  Weeks    Status  On-going      PT LONG TERM GOAL #2    Title  patient to demonstrate gait with LRAD over various levels and surfaces for >/= 500' with MOD I without LOB or overt instability  Time  13    Period  Weeks    Status  On-going      PT LONG TERM GOAL #3   Title  Patient to improve TUG to </= 13 seconds with LRAD demonstrating reduced fall risk    Baseline  TUG rollator: 23.11 sec; TUG SBQC: 1.07 min    Time  13    Period  Weeks    Status  On-going      PT LONG TERM GOAL #4   Title  Patient to improve gait velocity with LRAD to at least 66f/sec for decreased fall risk    Baseline  05/23/18: 1.8 ft/sec with rollator    Time  13    Period  Weeks    Status  On-going      PT LONG TERM GOAL #5   Title  Paitent to improve R LE strength by >/= 1 manual muscle grade demonstrating improved strength and functional use    Baseline  05/23/18: R LE grossly 3-/5    Time  13    Period  Weeks    Status  On-going            Plan - 06/30/18 1703    Clinical Impression Statement  Patient today presenting with lowered rollator height - ambulating with reduced stress noted at upper trap and patient feeling more stable with lowered height. patient today able to attain tall kneeling to focus on proximal hip strengthening. Addition of calf stretch today with good carryover. Continue to anticipate order form MD for swedish knee cage.     Rehab Potential  Good    PT Frequency  2x / week    PT Duration  Other (comment)   13 weeks/90 days   PT Treatment/Interventions  ADLs/Self Care Home Management;Cryotherapy;Electrical Stimulation;DME Instruction;Moist Heat;Gait training;Stair training;Functional mobility training;Therapeutic activities;Therapeutic exercise;Balance training;Patient/family education;Neuromuscular re-education;Manual techniques;Taping;Passive range of motion;Orthotic Fit/Training    PT Next Visit Plan  STGs due on 07/04/18, will need to be updated as LTGS due in December; continue to work on core strengthening/thoracic spine  strengthening for improved posture, continue to work on high level balance activites/right LE strengthening; check to see if order for knee cage came in- [facility] or paper order if not.     PT Home Exercise Plan  [id number]    Consulted and Agree with Plan of Care  Patient       Patient will benefit from skilled therapeutic intervention in order to improve the following deficits and impairments:  Abnormal gait, Decreased activity tolerance, Decreased balance, Decreased safety awareness, Difficulty walking, Decreased strength, Decreased mobility, Decreased coordination, Pain, Impaired sensation  Visit Diagnosis: Unsteadiness on feet  Other abnormalities of gait and mobility  Muscle weakness (generalized)     Problem List Patient Active Problem List   Diagnosis Date Noted  . Debility   . Anemia of chronic disease   . Chronic pain syndrome   . Hypokalemia   . Acute lower UTI 09/04/2017  . L3 vertebral fracture ([place] 09/01/2017  . Constipation due to pain medication   . Muscle spasms of both lower extremities   . Type 2 diabetes mellitus with peripheral neuropathy (HCC)   . Hypoalbuminemia due to protein-calorie malnutrition ([place]   . Radiculopathy 07/28/2017  . Lumbosacral spondylosis with radiculopathy 07/26/2017  . Paraplegia, incomplete ([place] 01/07/2017  . 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/27/2016  . Myelopathy ([place] 12/21/2016  . Neuropathic pain   . 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  Paraparesis ([place]) 12/15/2016  . Type 2 diabetes mellitus without complication, without long-term current use of insulin ([place]) 11/02/2016  . Hyperlipidemia 11/02/2016  . Essential hypertension 11/02/2016  . 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    Lanney Gins, PT, DPT Supplemental Physical Therapist 06/30/18 5:15 PM Pager: [phone] Office: [facility] [address] [place], [place], [address] Phone: [phone]   Fax:  [phone]  Name: Megan [name] MRN: [id number] Date of Birth: [birth year]

## 2018-07-05 ENCOUNTER — Encounter: Payer: Self-pay | Admitting: Physical Therapy

## 2018-07-05 ENCOUNTER — Ambulatory Visit: Payer: PPO | Admitting: Physical Therapy

## 2018-07-05 DIAGNOSIS — R2681 Unsteadiness on feet: Secondary | ICD-10-CM

## 2018-07-05 DIAGNOSIS — M6281 Muscle weakness (generalized): Secondary | ICD-10-CM

## 2018-07-05 DIAGNOSIS — R2689 Other abnormalities of gait and mobility: Secondary | ICD-10-CM

## 2018-07-05 NOTE — Therapy (Signed)
[facility] [address] [place], [place], [address] Phone: [phone]   Fax:  [phone]  Physical Therapy Treatment  Patient Details  Name: Megan [name] MRN: [id number] Date of Birth: 05/31/1951 Referring Provider (PT): Dr. Stern   Encounter Date: 07/05/2018  PT End of Session - 07/05/18 1109    Visit Number  13    Number of Visits  27    Date for PT Re-Evaluation  08/21/18    Authorization Type  Medicare    PT Start Time  1103    PT Stop Time  1143    PT Time Calculation (min)  40 min    Equipment Utilized During Treatment  Gait belt    Activity Tolerance  Patient tolerated treatment well    Behavior During Therapy  WFL for tasks assessed/performed       Past Medical History:  Diagnosis Date  . Arthritis    "hands, back" (07/26/2017)  . Dyspnea    W/ PHYS CONDITION   . GERD (gastroesophageal reflux disease)   . History of bronchitis    "not since I quit smoking" (07/26/2017)  . History of kidney stones   . History of shingles   . Hyperlipidemia    takes Fish Oil daily  . Hypertension   . Neuromuscular disorder (HCC)    tingling toes  . 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  . Pneumonia 2009  . PONV (postoperative nausea and vomiting)   . Restless leg   . Type 2 diabetes mellitus (HCC)   . Uterine cancer (HCC) 1979   S/P hysterectomy  . Weakness    numbness and tingling in both feet r/t back    Past Surgical History:  Procedure Laterality Date  . ABDOMINAL EXPOSURE N/A 07/26/2017   Procedure: ABDOMINAL EXPOSURE;  Surgeon: Dickson, Christopher S, MD;  [facility];  Service: Vascular;  Laterality: N/A;  . ANTERIOR CERVICAL DECOMP/DISCECTOMY FUSION  2001  . ANTERIOR LUMBAR FUSION N/A 07/26/2017   Procedure: Lumbar five-Sacral one Anterior lumbar interbody fusion with Dr. Christopher Dickson for approach;  Surgeon: Ditty, Benjamin Jared, MD;  [facility];  Service:  Neurosurgery;  Laterality: N/A;  . APPLICATION OF ROBOTIC ASSISTANCE FOR SPINAL PROCEDURE  12/19/2016   Procedure: APPLICATION OF ROBOTIC ASSISTANCE FOR SPINAL PROCEDURE;  Surgeon: Benjamin Jared Ditty, MD;  [facility];  Service: Neurosurgery;;  . APPLICATION OF ROBOTIC ASSISTANCE FOR SPINAL PROCEDURE N/A 07/26/2017   Procedure: APPLICATION OF ROBOTIC ASSISTANCE FOR SPINAL PROCEDURE;  Surgeon: Ditty, Benjamin Jared, MD;  [facility];  Service: Neurosurgery;  Laterality: N/A;  . APPLICATION OF ROBOTIC ASSISTANCE FOR SPINAL PROCEDURE N/A 09/02/2017   Procedure: APPLICATION OF ROBOTIC ASSISTANCE FOR SPINAL PROCEDURE;  Surgeon: Ditty, Benjamin Jared, MD;  [facility];  Service: Neurosurgery;  Laterality: N/A;  . BACK SURGERY    . BLADDER SUSPENSION  1991   tack  . INGUINAL HERNIA REPAIR Right 1991  . LUMBAR FUSION  2016; 2017; 07/26/2017   L4-5; L2-3; L5-S1  . POSTERIOR LUMBAR FUSION 4 WITH HARDWARE REMOVAL N/A 09/02/2017   Procedure: Lumbar three-four redo laminectomy; Repositioning of Left Sacral two screw; Extension of lumbar fusion to Thoracic twelve;  Surgeon: Ditty, Benjamin Jared, MD;  [facility];  Service: Neurosurgery;  Laterality: N/A;  . TONSILLECTOMY AND ADENOIDECTOMY  1959  . TUMOR EXCISION     WERTHIN'S TUMORS BOTH SIDES OF NECK  . VAGINAL HYSTERECTOMY  1979    There were no   vitals filed for this visit.  Subjective Assessment - 07/05/18 1107    Subjective  Did not sleep well last night and having some low back pain today.     Pertinent History  HTN, DM2, left T10 costotransversectomy, T10-T11 laminectomy for decompression, pedicle screws, T9-T12 (4/18), L5-S1 anterior retroperitoneal exposure, L5-S1 laminectomy, L5-S1 posterior lateral fixation with fusion (11/18); chronic pain    Limitations  Standing;Walking    How long can you walk comfortably?  household distances, limited community ambulation with RW vs rollator    Patient Stated Goals  improve balance and  transition to use of cane    Currently in Pain?  Yes    Pain Score  4     Pain Location  Back    Pain Orientation  Lower;Mid;Right;Left    Pain Descriptors / Indicators  Aching;Discomfort;Sore    Pain Type  Chronic pain    Pain Onset  More than a month ago    Pain Frequency  Intermittent    Aggravating Factors   certain activity    Pain Relieving Factors  medication         [facility] PT Assessment - 07/05/18 1111      Timed Up and Go Test   TUG  Normal TUG    Normal TUG (seconds)  18.19   with rollator          [facility] Adult PT Treatment/Exercise - 07/05/18 1119      Transfers   Transfers  Sit to Stand;Stand to Sit    Sit to Stand  6: Modified independent (Device/Increase time)    Stand to Sit  6: Modified independent (Device/Increase time)    Number of Reps  10 reps;1 set    Comments  with single UE support on chair, then use of rollator to stabiize upon standing.  min guard assist.       Ambulation/Gait   Ambulation/Gait  Yes    Ambulation/Gait Assistance  5: Supervision    Ambulation/Gait Assistance Details  use of swedish knee cage with gait. occasional right toe scuffing noted, no balance issues noted.     Ambulation Distance (Feet)  420 Feet    Assistive device  Rollator    Gait Pattern  Step-through pattern;Decreased stride length;Decreased stance time - right;Decreased step length - left;Decreased dorsiflexion - right;Decreased weight shift to right;Right genu recurvatum    Ambulation Surface  Level;Indoor          Balance Exercises - 07/05/18 1131      Balance Exercises: Standing   Standing Eyes Closed  Narrow base of support (BOS);Head turns;Foam/compliant surface;Other reps (comment);30 secs;Limitations      Balance Exercises: Standing   Standing Eyes Closed Limitations  on dense blue foam in open position with feet hip width apart: EO alternating UE raises, progressing to bil UE raises with min assist for balance; then EC no head movements, progressing to  EC head movements left<>right, then up<>down with min to mod assist for balance.           PT Short Term Goals - 07/05/18 1110      PT SHORT TERM GOAL #1   Title  patient to be independent with initial HEP for strength, balance and gait. (All STGs due 07/04/18)    Baseline  07/05/18: pt is independent with current HEP    Time  --    Period  --    Status  Achieved      PT SHORT TERM GOAL #2  Title  Patient to demonstrate TUG with rollator to </= 16 seconds for reduced fall risk    Baseline  07/05/18: 18.19 sec's with rollator, improved just not to goal    Time  --    Period  --    Status  Partially Met      PT SHORT TERM GOAL #3   Title  Patient to demonstrate 400' gait on level surfaces with rollator at Mod I without LOB or overt instability    Baseline  07/05/18: met today with rollator    Time  --    Period  --    Status  Achieved      PT SHORT TERM GOAL #4   Title  Patient to perform sit to stand x 10 with unilateral UE support with Mod I demonstrating improved strength and efficiency of transfers    Baseline  07/05/18: performed 10 reps with single UE support on chair, needed rollator for stability upon standing, no physical assist needed.     Time  --    Period  --    Status  Achieved        PT Long Term Goals - 06/19/18 2140      PT LONG TERM GOAL #1   Title  Patient to be independent with final HEP for balance, strength, gait and other functional mobility. (All LTGs due 08/21/18)    Time  13    Period  Weeks    Status  On-going      PT LONG TERM GOAL #2   Title  patient to demonstrate gait with LRAD over various levels and surfaces for >/= 500' with MOD I without LOB or overt instability    Time  13    Period  Weeks    Status  On-going      PT LONG TERM GOAL #3   Title  Patient to improve TUG to </= 13 seconds with LRAD demonstrating reduced fall risk    Baseline  TUG rollator: 23.11 sec; TUG SBQC: 1.07 min    Time  13    Period  Weeks    Status   On-going      PT LONG TERM GOAL #4   Title  Patient to improve gait velocity with LRAD to at least 31f/sec for decreased fall risk    Baseline  05/23/18: 1.8 ft/sec with rollator    Time  13    Period  Weeks    Status  On-going      PT LONG TERM GOAL #5   Title  Paitent to improve R LE strength by >/= 1 manual muscle grade demonstrating improved strength and functional use    Baseline  05/23/18: R LE grossly 3-/5    Time  13    Period  Weeks    Status  On-going        Plan - 07/05/18 1109    Clinical Impression Statement  Today's skilled session intially addressed progress toward STGs with 3/4 met and 4th goal partially met (pt improved her timed up and go with rollator, just not to goal). Remainder of session continued to address balance reactions with no issues reported. The pt is progressing and should benefit from continued PT to progress toward unmet goals.     Rehab Potential  Good    PT Frequency  2x / week    PT Duration  Other (comment)   13 weeks/90 days   PT Treatment/Interventions  ADLs/Self Care Home Management;Cryotherapy;Electrical Stimulation;DME  Instruction;Moist Heat;Gait training;Stair training;Functional mobility training;Therapeutic activities;Therapeutic exercise;Balance training;Patient/family education;Neuromuscular re-education;Manual techniques;Taping;Passive range of motion;Orthotic Fit/Training    PT Next Visit Plan  continue to work on core strengthening/thoracic spine strengthening for improved posture, continue to work on high level balance activites/right LE strengthening; check to see if order for knee cage came in- may need to submit to [facility] MD or paper order if not.     PT Home Exercise Plan  [id number]    Consulted and Agree with Plan of Care  Patient       Patient will benefit from skilled therapeutic intervention in order to improve the following deficits and impairments:  Abnormal gait, Decreased activity tolerance, Decreased balance, Decreased safety  awareness, Difficulty walking, Decreased strength, Decreased mobility, Decreased coordination, Pain, Impaired sensation  Visit Diagnosis: Unsteadiness on feet  Other abnormalities of gait and mobility  Muscle weakness (generalized)     Problem List Patient Active Problem List   Diagnosis Date Noted  . Debility   . Anemia of chronic disease   . Chronic pain syndrome   . Hypokalemia   . Acute lower UTI 09/04/2017  . L3 vertebral fracture (HCC) 09/01/2017  . Constipation due to pain medication   . Muscle spasms of both lower extremities   . Type 2 diabetes mellitus with peripheral neuropathy (HCC)   . Hypoalbuminemia due to protein-calorie malnutrition (HCC)   . Radiculopathy 07/28/2017  . Lumbosacral spondylosis with radiculopathy 07/26/2017  . Paraplegia, incomplete (HCC) 01/07/2017  . 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/27/2016  . Myelopathy (HCC) 12/21/2016  . Neuropathic pain   . 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 Paraparesis (HCC) 12/15/2016  . Type 2 diabetes mellitus without complication, without long-term current use of insulin (HCC) 11/02/2016  . Hyperlipidemia 11/02/2016  . Essential hypertension 11/02/2016  . 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    Kathy Bury, PTA, CLT [facility] [address], [address] [place],  [address] [phone] 07/06/18, 9:03 AM   Name: Megan [name] MRN: [id number] Date of Birth: 11/13/1950   

## 2018-07-07 ENCOUNTER — Ambulatory Visit: Payer: PPO

## 2018-07-07 DIAGNOSIS — R2681 Unsteadiness on feet: Secondary | ICD-10-CM

## 2018-07-07 DIAGNOSIS — M6281 Muscle weakness (generalized): Secondary | ICD-10-CM

## 2018-07-07 DIAGNOSIS — R2689 Other abnormalities of gait and mobility: Secondary | ICD-10-CM

## 2018-07-07 NOTE — Therapy (Signed)
[facility] [address] [place], [place], [address] Phone: [phone]   Fax:  [phone]  Physical Therapy Treatment  Patient Details  Name: Megan [name] MRN: [id number] Date of Birth: 1951/03/30 Referring Provider (PT): Dr. Vertell Limber   Encounter Date: 07/07/2018  PT End of Session - 07/07/18 1452    Visit Number  14    Number of Visits  27    Date for PT Re-Evaluation  08/21/18    Authorization Type  Medicare    PT Start Time  1448    PT Stop Time  1530    PT Time Calculation (min)  42 min    Equipment Utilized During Treatment  Gait belt    Activity Tolerance  Patient tolerated treatment well    Behavior During Therapy  [facility] for tasks assessed/performed       Past Medical History:  Diagnosis Date  . Arthritis    "hands, back" (07/26/2017)  . Dyspnea    W/ PHYS CONDITION   . GERD (gastroesophageal reflux disease)   . History of bronchitis    "not since I quit smoking" (07/26/2017)  . History of kidney stones   . History of shingles   . Hyperlipidemia    takes Fish Oil daily  . Hypertension   . Neuromuscular disorder (HCC)    tingling toes  . 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[place]) 12/15/2016  . Pneumonia 2009  . PONV (postoperative nausea and vomiting)   . Restless leg   . Type 2 diabetes mellitus ([place])   . Uterine cancer ([place]) 1979   S/P hysterectomy  . Weakness    numbness and tingling in both feet r/t back    Past Surgical History:  Procedure Laterality Date  . ABDOMINAL EXPOSURE N/A 07/26/2017   Procedure: ABDOMINAL EXPOSURE;  Surgeon: Angelia Mould, MD;  Location: [facility];  Service: Vascular;  Laterality: N/A;  . ANTERIOR CERVICAL DECOMP/DISCECTOMY FUSION  2001  . ANTERIOR LUMBAR FUSION N/A 07/26/2017   Procedure: Lumbar five-Sacral one Anterior lumbar interbody fusion with Dr. Deitra Mayo for approach;  Surgeon: Ditty, Kevan Ny, MD;  Location: [facility];  Service:  Neurosurgery;  Laterality: N/A;  . APPLICATION OF ROBOTIC ASSISTANCE FOR SPINAL PROCEDURE  12/19/2016   Procedure: APPLICATION OF ROBOTIC ASSISTANCE FOR SPINAL PROCEDURE;  Surgeon: Kevan Ny Ditty, MD;  Location: [facility];  Service: Neurosurgery;;  . APPLICATION OF ROBOTIC ASSISTANCE FOR SPINAL PROCEDURE N/A 07/26/2017   Procedure: APPLICATION OF ROBOTIC ASSISTANCE FOR SPINAL PROCEDURE;  Surgeon: Ditty, Kevan Ny, MD;  Location: [facility];  Service: Neurosurgery;  Laterality: N/A;  . APPLICATION OF ROBOTIC ASSISTANCE FOR SPINAL PROCEDURE N/A 09/02/2017   Procedure: APPLICATION OF ROBOTIC ASSISTANCE FOR SPINAL PROCEDURE;  Surgeon: Ditty, Kevan Ny, MD;  Location: [facility];  Service: Neurosurgery;  Laterality: N/A;  . BACK SURGERY    . BLADDER SUSPENSION  1991   tack  . INGUINAL HERNIA REPAIR Right 1991  . LUMBAR FUSION  2016; 2017; 07/26/2017   L4-5; L2-3; L5-S1  . POSTERIOR LUMBAR FUSION 4 WITH HARDWARE REMOVAL N/A 09/02/2017   Procedure: Lumbar three-four redo laminectomy; Repositioning of Left Sacral two screw; Extension of lumbar fusion to Thoracic twelve;  Surgeon: Ditty, Kevan Ny, MD;  Location: [facility];  Service: Neurosurgery;  Laterality: N/A;  . TONSILLECTOMY AND ADENOIDECTOMY  1959  . TUMOR EXCISION     WERTHIN'S TUMORS BOTH SIDES OF NECK  . VAGINAL HYSTERECTOMY  1979    There were no  vitals filed for this visit.  Subjective Assessment - 07/07/18 1451    Subjective  No falls to report, no compliants.     Patient is accompained by:  Family member   Husband   Pertinent History  HTN, DM2, left T10 costotransversectomy, T10-T11 laminectomy for decompression, pedicle screws, T9-T12 (4/18), L5-S1 anterior retroperitoneal exposure, L5-S1 laminectomy, L5-S1 posterior lateral fixation with fusion (11/18); chronic pain    Limitations  Standing;Walking    How long can you walk comfortably?  household distances, limited community ambulation with RW vs rollator    Patient Stated  Goals  improve balance and transition to use of cane    Currently in Pain?  No/denies        [facility] Adult PT Treatment/Exercise - 07/07/18 1452      Ambulation/Gait   Ambulation/Gait  Yes    Ambulation/Gait Assistance  5: Supervision    Ambulation/Gait Assistance Details  Excessive UE reliance and downward gaze.     Ambulation Distance (Feet)  500 Feet    Assistive device  Rollator;Other (Comment)   Swedish knee brace   Gait Pattern  Step-through pattern;Decreased stride length;Decreased stance time - right;Decreased step length - left;Decreased dorsiflexion - right;Decreased weight shift to right;Right genu recurvatum    Ambulation Surface  Level;Unlevel;Indoor;Outdoor;Paved      Neuro Re-ed    Neuro Re-ed Details   In parallel bars on blue foam beam alt. heel/center/toe tap with BUE progressing to SUE support. Min guard with VC's for sequence, posture and forward gaze.       Knee/Hip Exercises: Aerobic   Nustep  L4 78mns 295 steps        PT Short Term Goals - 07/05/18 1110      PT SHORT TERM GOAL #1   Title  patient to be independent with initial HEP for strength, balance and gait. (All STGs due 07/04/18)    Baseline  07/05/18: pt is independent with current HEP    Time  --    Period  --    Status  Achieved      PT SHORT TERM GOAL #2   Title  Patient to demonstrate TUG with rollator to </= 16 seconds for reduced fall risk    Baseline  07/05/18: 18.19 sec's with rollator, improved just not to goal    Time  --    Period  --    Status  Partially Met      PT SHORT TERM GOAL #3   Title  Patient to demonstrate 400' gait on level surfaces with rollator at Mod I without LOB or overt instability    Baseline  07/05/18: met today with rollator    Time  --    Period  --    Status  Achieved      PT SHORT TERM GOAL #4   Title  Patient to perform sit to stand x 10 with unilateral UE support with Mod I demonstrating improved strength and efficiency of transfers    Baseline   07/05/18: performed 10 reps with single UE support on chair, needed rollator for stability upon standing, no physical assist needed.     Time  --    Period  --    Status  Achieved        PT Long Term Goals - 06/19/18 2140      PT LONG TERM GOAL #1   Title  Patient to be independent with final HEP for balance, strength, gait and other functional mobility. (All LTGs  due 08/21/18)    Time  13    Period  Weeks    Status  On-going      PT LONG TERM GOAL #2   Title  patient to demonstrate gait with LRAD over various levels and surfaces for >/= 500' with MOD I without LOB or overt instability    Time  13    Period  Weeks    Status  On-going      PT LONG TERM GOAL #3   Title  Patient to improve TUG to </= 13 seconds with LRAD demonstrating reduced fall risk    Baseline  TUG rollator: 23.11 sec; TUG SBQC: 1.07 min    Time  13    Period  Weeks    Status  On-going      PT LONG TERM GOAL #4   Title  Patient to improve gait velocity with LRAD to at least 44f/sec for decreased fall risk    Baseline  05/23/18: 1.8 ft/sec with rollator    Time  13    Period  Weeks    Status  On-going      PT LONG TERM GOAL #5   Title  Paitent to improve R LE strength by >/= 1 manual muscle grade demonstrating improved strength and functional use    Baseline  05/23/18: R LE grossly 3-/5    Time  13    Period  Weeks    Status  On-going       Plan - 07/07/18 1546    Clinical Impression Statement  Todays skilled session focused on gait training with rollator/swedish knee brace with increase in distance/endurance and high level balance while reducing UE reliance. Pt continues to progress and should benefit from PT sessions to progress towards goals.     Rehab Potential  Good    PT Frequency  2x / week    PT Duration  Other (comment)   13 weeks/90 days   PT Treatment/Interventions  ADLs/Self Care Home Management;Cryotherapy;Electrical Stimulation;DME Instruction;Moist Heat;Gait training;Stair  training;Functional mobility training;Therapeutic activities;Therapeutic exercise;Balance training;Patient/family education;Neuromuscular re-education;Manual techniques;Taping;Passive range of motion;Orthotic Fit/Training    PT Next Visit Plan  Gait training with LBQC/knee brace, continue to work on core strengthening/thoracic spine strengthening for improved posture, continue to work on high level balance activites/right LE strengthening; check to see if order for knee cage came in- [facility] if not.     PT Home Exercise Plan  [id number]    Consulted and Agree with Plan of Care  Patient       Patient will benefit from skilled therapeutic intervention in order to improve the following deficits and impairments:  Abnormal gait, Decreased activity tolerance, Decreased balance, Decreased safety awareness, Difficulty walking, Decreased strength, Decreased mobility, Decreased coordination, Pain, Impaired sensation  Visit Diagnosis: Unsteadiness on feet  Other abnormalities of gait and mobility  Muscle weakness (generalized)     Problem List Patient Active Problem List   Diagnosis Date Noted  . Debility   . Anemia of chronic disease   . Chronic pain syndrome   . Hypokalemia   . Acute lower UTI 09/04/2017  . L3 vertebral fracture ([place] 09/01/2017  . Constipation due to pain medication   . Muscle spasms of both lower extremities   . Type 2 diabetes mellitus with peripheral neuropathy (HCC)   . Hypoalbuminemia due to protein-calorie malnutrition ([place]   . Radiculopathy 07/28/2017  . Lumbosacral spondylosis with radiculopathy 07/26/2017  . Paraplegia,  incomplete ([place]) 01/07/2017  . 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/27/2016  . Myelopathy ([place]) 12/21/2016  . Neuropathic pain   . 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 Paraparesis ([place]) 12/15/2016  . Type 2 diabetes mellitus without complication, without long-term current use of insulin ([place])  11/02/2016  . Hyperlipidemia 11/02/2016  . Essential hypertension 11/02/2016  . 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   Chassity Felts, PTA  Chassity A Felts 07/07/2018, 3:52 PM  [facility] [address] [place], [place], [address] Phone: [phone]   Fax:  [phone]  Name: BRISA AUTH MRN: [id number] Date of Birth: November 06, 1950

## 2018-07-11 ENCOUNTER — Encounter: Payer: Self-pay | Admitting: Physical Therapy

## 2018-07-11 ENCOUNTER — Ambulatory Visit: Payer: PPO | Admitting: Physical Therapy

## 2018-07-11 ENCOUNTER — Telehealth: Payer: Self-pay | Admitting: Physical Therapy

## 2018-07-11 DIAGNOSIS — R2681 Unsteadiness on feet: Secondary | ICD-10-CM | POA: Diagnosis not present

## 2018-07-11 DIAGNOSIS — M6281 Muscle weakness (generalized): Secondary | ICD-10-CM

## 2018-07-11 DIAGNOSIS — R2689 Other abnormalities of gait and mobility: Secondary | ICD-10-CM

## 2018-07-11 NOTE — Telephone Encounter (Signed)
Dr. Letta Pate,  Megan [name] is being treated by physical therapy for gait and balance s/p lumbar surgery.  She will benefit from use of swedish knee cage to her left LE in order to improve safety with functional mobility.    If you agree, please submit request in EPIC under referral for DME (list swedish knee cage in comments) or fax to [facility] at [phone].   Thank you,  [name], PTA, [facility] [address], [name] Megan [name], [name] [phone] 07/11/18, 10:03 AM   [facility] [address] [place], [place]  [address] Phone:  [phone] Fax:  [phone]

## 2018-07-11 NOTE — Telephone Encounter (Signed)
I have not seen pt for >66mo , need pt to make appt

## 2018-07-13 NOTE — Therapy (Signed)
[facility] [address] [place], [place], [address] Phone: [phone]   Fax:  [phone]  Physical Therapy Treatment  Patient Details  Name: Megan [name] MRN: [id number] Date of Birth: 12-Aug-1951 Referring Provider (PT): Dr. Vertell Limber   Encounter Date: 07/11/2018   07/11/18 1258  PT Visits / Re-Eval  Visit Number 15  Number of Visits 27  Date for PT Re-Evaluation 08/21/18  Authorization  Authorization Type Medicare  PT Time Calculation  PT Start Time 1533  PT Stop Time 1615  PT Time Calculation (min) 42 min  PT - End of Session  Equipment Utilized During Treatment Gait belt  Activity Tolerance Patient tolerated treatment well  Behavior During Therapy [facility] for tasks assessed/performed     Past Medical History:  Diagnosis Date  . Arthritis    "hands, back" (07/26/2017)  . Dyspnea    W/ PHYS CONDITION   . GERD (gastroesophageal reflux disease)   . History of bronchitis    "not since I quit smoking" (07/26/2017)  . History of kidney stones   . History of shingles   . Hyperlipidemia    takes Fish Oil daily  . Hypertension   . Neuromuscular disorder (HCC)    tingling toes  . 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[place]) 12/15/2016  . Pneumonia 2009  . PONV (postoperative nausea and vomiting)   . Restless leg   . Type 2 diabetes mellitus ([place])   . Uterine cancer ([place]) 1979   S/P hysterectomy  . Weakness    numbness and tingling in both feet r/t back    Past Surgical History:  Procedure Laterality Date  . ABDOMINAL EXPOSURE N/A 07/26/2017   Procedure: ABDOMINAL EXPOSURE;  Surgeon: Angelia Mould, MD;  Location: [facility];  Service: Vascular;  Laterality: N/A;  . ANTERIOR CERVICAL DECOMP/DISCECTOMY FUSION  2001  . ANTERIOR LUMBAR FUSION N/A 07/26/2017   Procedure: Lumbar five-Sacral one Anterior lumbar interbody fusion with Dr. Deitra Mayo for approach;  Surgeon: Ditty, Kevan Ny, MD;   Location: [facility];  Service: Neurosurgery;  Laterality: N/A;  . APPLICATION OF ROBOTIC ASSISTANCE FOR SPINAL PROCEDURE  12/19/2016   Procedure: APPLICATION OF ROBOTIC ASSISTANCE FOR SPINAL PROCEDURE;  Surgeon: Kevan Ny Ditty, MD;  Location: [facility];  Service: Neurosurgery;;  . APPLICATION OF ROBOTIC ASSISTANCE FOR SPINAL PROCEDURE N/A 07/26/2017   Procedure: APPLICATION OF ROBOTIC ASSISTANCE FOR SPINAL PROCEDURE;  Surgeon: Ditty, Kevan Ny, MD;  Location: [facility];  Service: Neurosurgery;  Laterality: N/A;  . APPLICATION OF ROBOTIC ASSISTANCE FOR SPINAL PROCEDURE N/A 09/02/2017   Procedure: APPLICATION OF ROBOTIC ASSISTANCE FOR SPINAL PROCEDURE;  Surgeon: Ditty, Kevan Ny, MD;  Location: [facility];  Service: Neurosurgery;  Laterality: N/A;  . BACK SURGERY    . BLADDER SUSPENSION  1991   tack  . INGUINAL HERNIA REPAIR Right 1991  . LUMBAR FUSION  2016; 2017; 07/26/2017   L4-5; L2-3; L5-S1  . POSTERIOR LUMBAR FUSION 4 WITH HARDWARE REMOVAL N/A 09/02/2017   Procedure: Lumbar three-four redo laminectomy; Repositioning of Left Sacral two screw; Extension of lumbar fusion to Thoracic twelve;  Surgeon: Ditty, Kevan Ny, MD;  Location: [facility];  Service: Neurosurgery;  Laterality: N/A;  . TONSILLECTOMY AND ADENOIDECTOMY  1959  . TUMOR EXCISION     WERTHIN'S TUMORS BOTH SIDES OF NECK  . VAGINAL HYSTERECTOMY  1979    There were no vitals filed for this visit.     07/11/18 1539  Symptoms/Limitations  Subjective No falls to report;  complaines of tightness in right groin area  Patient is accompained by: Family member (Husband)  Pertinent History HTN, DM2, left T10 costotransversectomy, T10-T11 laminectomy for decompression, pedicle screws, T9-T12 (4/18), L5-S1 anterior retroperitoneal exposure, L5-S1 laminectomy, L5-S1 posterior lateral fixation with fusion (11/18); chronic pain  Limitations Standing;Walking  How long can you walk comfortably? household distances, limited community ambulation  with RW vs rollator  Patient Stated Goals improve balance and transition to use of cane  Pain Assessment  Currently in Pain? No/denies      07/11/18 1552  Transfers  Transfers Sit to Stand;Stand to Sit  Sit to Stand 6: Modified independent (Device/Increase time)  Stand to Sit 6: Modified independent (Device/Increase time)  Ambulation/Gait  Ambulation/Gait Yes  Ambulation/Gait Assistance 5: Supervision  Ambulation/Gait Assistance Details cues on posture, decr weight bearing through UE's, and for equal step length. use of Sweedish knee cage to right LE with session.   Ambulation Distance (Feet) 230 Feet (x1,. plus around gym with activity )  Assistive device Rollator;Other (Comment)  Gait Pattern Step-through pattern;Decreased stride length;Decreased stance time - right;Decreased step length - left;Decreased dorsiflexion - right;Decreased weight shift to right;Right genu recurvatum  Ambulation Surface Level;Indoor  Neuro Re-ed   Neuro Re-ed Details  for balance/NMR/strengthening: in parallel bars with bil light UE support: with airex- alternating fwd step ups with other foot in air, hold for 3 sec's, then lowering back down x 10 reps each side, then lateral setp ups x 10 each leg with light UE support- min guard assist for balance; standing on airex- alternating high knee marching with light finger tip support,   Exercises  Other Exercises  for strengthening of LE's: mini squats x 20 reps with cues on form/decr UE support; fwd step ups using 4 inch box, alternating lead leg 2 sets x 10 reps each leg with cues on technique and decr UE use; with red band around legs just above knees- hip abdcution/adduction x 10 reps each side with cues for form/technique, bil light UE support.                          PT Short Term Goals - 07/05/18 1110      PT SHORT TERM GOAL #1   Title  patient to be independent with initial HEP for strength, balance and gait. (All STGs due 07/04/18)    Baseline   07/05/18: pt is independent with current HEP    Time  --    Period  --    Status  Achieved      PT SHORT TERM GOAL #2   Title  Patient to demonstrate TUG with rollator to </= 16 seconds for reduced fall risk    Baseline  07/05/18: 18.19 sec's with rollator, improved just not to goal    Time  --    Period  --    Status  Partially Met      PT SHORT TERM GOAL #3   Title  Patient to demonstrate 400' gait on level surfaces with rollator at Mod I without LOB or overt instability    Baseline  07/05/18: met today with rollator    Time  --    Period  --    Status  Achieved      PT SHORT TERM GOAL #4   Title  Patient to perform sit to stand x 10 with unilateral UE support with Mod I demonstrating improved strength and efficiency of transfers  Baseline  07/05/18: performed 10 reps with single UE support on chair, needed rollator for stability upon standing, no physical assist needed.     Time  --    Period  --    Status  Achieved        PT Long Term Goals - 06/19/18 2140      PT LONG TERM GOAL #1   Title  Patient to be independent with final HEP for balance, strength, gait and other functional mobility. (All LTGs due 08/21/18)    Time  13    Period  Weeks    Status  On-going      PT LONG TERM GOAL #2   Title  patient to demonstrate gait with LRAD over various levels and surfaces for >/= 500' with MOD I without LOB or overt instability    Time  13    Period  Weeks    Status  On-going      PT LONG TERM GOAL #3   Title  Patient to improve TUG to </= 13 seconds with LRAD demonstrating reduced fall risk    Baseline  TUG rollator: 23.11 sec; TUG SBQC: 1.07 min    Time  13    Period  Weeks    Status  On-going      PT LONG TERM GOAL #4   Title  Patient to improve gait velocity with LRAD to at least 5f/sec for decreased fall risk    Baseline  05/23/18: 1.8 ft/sec with rollator    Time  13    Period  Weeks    Status  On-going      PT LONG TERM GOAL #5   Title  Paitent to  improve R LE strength by >/= 1 manual muscle grade demonstrating improved strength and functional use    Baseline  05/23/18: R LE grossly 3-/5    Time  13    Period  Weeks    Status  On-going          07/11/18 1700  Plan  Clinical Impression Statement Today's skilled session continued it's emphasis on gait and balance activities with use of Swidish knee cage. The pt is progressing toward goals and should benefit from continued PT to progress toward umet goals.  Pt will benefit from skilled therapeutic intervention in order to improve on the following deficits Abnormal gait;Decreased activity tolerance;Decreased balance;Decreased safety awareness;Difficulty walking;Decreased strength;Decreased mobility;Decreased coordination;Pain;Impaired sensation  Rehab Potential Good  PT Frequency 2x / week  PT Duration Other (comment) (13 weeks/90 days)  PT Treatment/Interventions ADLs/Self Care Home Management;Cryotherapy;Electrical Stimulation;DME Instruction;Moist Heat;Gait training;Stair training;Functional mobility training;Therapeutic activities;Therapeutic exercise;Balance training;Patient/family education;Neuromuscular re-education;Manual techniques;Taping;Passive range of motion;Orthotic Fit/Training  PT Next Visit Plan Pt on hold until she gets her own Swedish knee cage; Gait training with LBQC/knee brace, continue to work on core strengthening/thoracic spine strengthening for improved posture, continue to work on high level balance activites/right LE strengthening  PT [place] Agree with Plan of Care Patient         Patient will benefit from skilled therapeutic intervention in order to improve the following deficits and impairments:  Abnormal gait, Decreased activity tolerance, Decreased balance, Decreased safety awareness, Difficulty walking, Decreased strength, Decreased mobility, Decreased coordination, Pain, Impaired sensation  Visit  Diagnosis: Unsteadiness on feet  Other abnormalities of gait and mobility  Muscle weakness (generalized)     Problem List Patient Active Problem List   Diagnosis Date Noted  . Debility   .  Anemia of chronic disease   . Chronic pain syndrome   . Hypokalemia   . Acute lower UTI 09/04/2017  . L3 vertebral fracture ([place]) 09/01/2017  . Constipation due to pain medication   . Muscle spasms of both lower extremities   . Type 2 diabetes mellitus with peripheral neuropathy (HCC)   . Hypoalbuminemia due to protein-calorie malnutrition ([place])   . Radiculopathy 07/28/2017  . Lumbosacral spondylosis with radiculopathy 07/26/2017  . Paraplegia, incomplete ([place]) 01/07/2017  . 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/27/2016  . Myelopathy ([place]) 12/21/2016  . Neuropathic pain   . 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 Paraparesis ([place]) 12/15/2016  . Type 2 diabetes mellitus without complication, without long-term current use of insulin ([place]) 11/02/2016  . Hyperlipidemia 11/02/2016  . Essential hypertension 11/02/2016  . 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    [name], PTA, [facility] [address] [place], [place] [address] [phone] 07/13/18, 1:09 PM   Name: STEPHINE LANGBEHN MRN: [id number] Date of Birth: 05/06/1951

## 2018-07-14 ENCOUNTER — Ambulatory Visit: Payer: PPO

## 2018-07-17 ENCOUNTER — Encounter: Payer: Self-pay | Admitting: Primary Care

## 2018-07-17 ENCOUNTER — Ambulatory Visit (INDEPENDENT_AMBULATORY_CARE_PROVIDER_SITE_OTHER): Payer: PPO | Admitting: Primary Care

## 2018-07-17 VITALS — BP 132/84 | HR 70 | Temp 98.1°F | Ht 70.0 in | Wt 162.8 lb

## 2018-07-17 DIAGNOSIS — E119 Type 2 diabetes mellitus without complications: Secondary | ICD-10-CM | POA: Diagnosis not present

## 2018-07-17 DIAGNOSIS — M25542 Pain in joints of left hand: Secondary | ICD-10-CM | POA: Insufficient documentation

## 2018-07-17 DIAGNOSIS — D638 Anemia in other chronic diseases classified elsewhere: Secondary | ICD-10-CM | POA: Diagnosis not present

## 2018-07-17 DIAGNOSIS — G8222 Paraplegia, incomplete: Secondary | ICD-10-CM | POA: Diagnosis not present

## 2018-07-17 DIAGNOSIS — M62838 Other muscle spasm: Secondary | ICD-10-CM | POA: Diagnosis not present

## 2018-07-17 HISTORY — DX: Pain in joints of left hand: M25.542

## 2018-07-17 NOTE — Assessment & Plan Note (Signed)
Repeat A1C pending.  Continue Metformin 500 mg BID. Managed on statin and ACE. Pneumonia vaccination UTD. Foot and eye exam UTD.  Follow up in 6 months.

## 2018-07-17 NOTE — Assessment & Plan Note (Signed)
Likely osteoarthritic flare given recent increase in activity with outpatient PT. Check CCP, RF, and sed rate today.

## 2018-07-17 NOTE — Assessment & Plan Note (Signed)
Repeat CBC and IBC panel pending. No recent use of ferrous sulfate.

## 2018-07-17 NOTE — Progress Notes (Signed)
Subjective:    Patient ID: Megan [name], female    DOB: June 05, 1951, 67 y.o.   MRN: [id number]  HPI  Megan [name] is a 67 year old female who presents today for follow up. She's also wondering if she should be taking ferrous sulfate.   1) Type 2 Diabetes:  Current medications include: Metformin 500 mg BID.  She is checking her blood glucose 1-3 times daily and is getting readings of: AM fasting <120 2 hours after meals: 130's-150's.   Highest reading: 165 Lowest reading: 90  Last A1C: 6.5 in May 2019,  Last Eye Exam: Completed in August 2019 Last Foot Exam: due in May 2020 Pneumonia Vaccination: Completed in 2016 ACE/ARB: lisinopril  Statin: atorvastatin  Diet currently consists of:  Breakfast: Mini doughnuts, eggs, bacon, country ham, sausage, french toast Lunch: Salad, sandwich, crackers with chicken salad/cheese, fruit Dinner: Meat, vegetables, starch, pasta  Snacks: Rarely  Desserts: Daily  Beverages: Diet soda, cranberry juice, un-sweet tea, water  Exercise: She is not exercising  2) Chronic Back Pain/Chronic Muscle Spasms of Lower Extremities: Currently managed on methocarbamol 750 mg, gabapentin, celebrex, baclofen. She is experiencing spasms to her right lower extremity mostly when laying or sleeping, overall significantly improved. She's using baclofen 2-3 times daily. She's not sure why she's prescribed both baclofen and methocarbamol, methocarbamol is not covered by her insurance.  3) Paraplegia: Currently following with outpatient physical therapy ad is doing well. She is using a walker most of the time, some use of scooter when out in large stores. Using gabapentin as prescribed.    4) Joint Pain: Located to the left 4th digit at PIP and MCP joint. This has been present for the last one month. She has been more active with outpatient physical therapy. She denies obvious swelling. She is taking celebrex daily.   Review of Systems  Respiratory: Negative for  shortness of breath.   Cardiovascular: Negative for chest pain.  Musculoskeletal: Positive for arthralgias and myalgias.  Neurological: Negative for dizziness.       Past Medical History:  Diagnosis Date  . Arthritis    "hands, back" (07/26/2017)  . Dyspnea    W/ PHYS CONDITION   . GERD (gastroesophageal reflux disease)   . History of bronchitis    "not since I quit smoking" (07/26/2017)  . History of kidney stones   . History of shingles   . Hyperlipidemia    takes Fish Oil daily  . Hypertension   . Neuromuscular disorder (HCC)    tingling toes  . 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[place]) 12/15/2016  . Pneumonia 2009  . PONV (postoperative nausea and vomiting)   . Restless leg   . Type 2 diabetes mellitus ([place])   . Uterine cancer ([place]) 1979   S/P hysterectomy  . Weakness    numbness and tingling in both feet r/t back     Social History   Socioeconomic History  . Marital status: Married    Spouse name: [name]  . Number of children: 1  . Years of education: 49  . Highest education level: Not on file  Occupational History  . Not on file  Social Needs  . Financial resource strain: Not on file  . Food insecurity:    Worry: Not on file    Inability: Not on file  . Transportation needs:    Medical: Not on file    Non-medical: Not on file  Tobacco Use  . Smoking status: Former Smoker  Packs/day: 1.50    Years: 37.00    Pack years: 55.50    Types: Cigarettes    Last attempt to quit: 05/13/2006    Years since quitting: 12.1  . Smokeless tobacco: Never Used  Substance and Sexual Activity  . Alcohol use: Yes    Comment: 07/26/2017 "glass of wine once/wk if not on RX"  . Drug use: No  . Sexual activity: Not Currently    Birth control/protection: Surgical  Lifestyle  . Physical activity:    Days per week: Not on file    Minutes per session: Not on file  . Stress: Not on file  Relationships  . Social connections:    Talks on phone: Not on file    Gets together:  Not on file    Attends religious service: Not on file    Active member of club or organization: Not on file    Attends meetings of clubs or organizations: Not on file    Relationship status: Not on file  . Intimate partner violence:    Fear of current or ex partner: Not on file    Emotionally abused: Not on file    Physically abused: Not on file    Forced sexual activity: Not on file  Other Topics Concern  . Not on file  Social History Narrative   Lives w/ husband   Married.   1 child, 1 grandchildren.   Retired. Once worked for [employer].   Enjoys reading.     Past Surgical History:  Procedure Laterality Date  . ABDOMINAL EXPOSURE N/A 07/26/2017   Procedure: ABDOMINAL EXPOSURE;  Surgeon: Angelia Mould, MD;  Location: [facility];  Service: Vascular;  Laterality: N/A;  . ANTERIOR CERVICAL DECOMP/DISCECTOMY FUSION  2001  . ANTERIOR LUMBAR FUSION N/A 07/26/2017   Procedure: Lumbar five-Sacral one Anterior lumbar interbody fusion with Dr. Deitra Mayo for approach;  Surgeon: Ditty, Kevan Ny, MD;  Location: [facility];  Service: Neurosurgery;  Laterality: N/A;  . APPLICATION OF ROBOTIC ASSISTANCE FOR SPINAL PROCEDURE  12/19/2016   Procedure: APPLICATION OF ROBOTIC ASSISTANCE FOR SPINAL PROCEDURE;  Surgeon: Kevan Ny Ditty, MD;  Location: [facility];  Service: Neurosurgery;;  . APPLICATION OF ROBOTIC ASSISTANCE FOR SPINAL PROCEDURE N/A 07/26/2017   Procedure: APPLICATION OF ROBOTIC ASSISTANCE FOR SPINAL PROCEDURE;  Surgeon: Ditty, Kevan Ny, MD;  Location: [facility];  Service: Neurosurgery;  Laterality: N/A;  . APPLICATION OF ROBOTIC ASSISTANCE FOR SPINAL PROCEDURE N/A 09/02/2017   Procedure: APPLICATION OF ROBOTIC ASSISTANCE FOR SPINAL PROCEDURE;  Surgeon: Ditty, Kevan Ny, MD;  Location: [facility];  Service: Neurosurgery;  Laterality: N/A;  . BACK SURGERY    . BLADDER SUSPENSION  1991   tack  . INGUINAL HERNIA REPAIR Right 1991  . LUMBAR FUSION  2016; 2017; 07/26/2017    L4-5; L2-3; L5-S1  . POSTERIOR LUMBAR FUSION 4 WITH HARDWARE REMOVAL N/A 09/02/2017   Procedure: Lumbar three-four redo laminectomy; Repositioning of Left Sacral two screw; Extension of lumbar fusion to Thoracic twelve;  Surgeon: Ditty, Kevan Ny, MD;  Location: [facility];  Service: Neurosurgery;  Laterality: N/A;  . TONSILLECTOMY AND ADENOIDECTOMY  1959  . TUMOR EXCISION     WERTHIN'S TUMORS BOTH SIDES OF NECK  . VAGINAL HYSTERECTOMY  1979    Family History  Problem Relation Age of Onset  . Diabetes Mother   . Dementia Mother   . Cirrhosis Mother        Non alcoholic  . COPD Father   . Diabetes  Brother     Allergies  Allergen Reactions  . Shellfish Allergy Anaphylaxis, Swelling and Other (See Comments)    Tongue swells  . Oxycodone Nausea Only  . Penicillins Rash and Other (See Comments)    Has patient had a PCN reaction causing immediate rash, facial/tongue/throat swelling, SOB or lightheadedness with hypotension: Yes Has patient had a PCN reaction causing severe rash involving mucus membranes or skin necrosis: No Has patient had a PCN reaction that required hospitalization No Has patient had a PCN reaction occurring within the last 10 years: No If all of the above answers are "NO", then may proceed with Cephalosporin use.    Current Outpatient Medications on File Prior to Visit  Medication Sig Dispense Refill  . atorvastatin (LIPITOR) 20 MG tablet Take 1 tablet (20 mg total) by mouth every evening. 90 tablet 3  . baclofen (LIORESAL) 10 MG tablet TAKE 1 TABLET BY MOUTH 3 TO 4 TIMES DAILY AS NEEDED FOR SPASMS 180 tablet 0  . Biotin 5000 MCG CAPS Take 5,000 mcg by mouth every morning.     . celecoxib (CELEBREX) 200 MG capsule Take 1 capsule (200 mg total) by mouth 2 (two) times daily as needed for moderate pain. 180 capsule 0  . cholecalciferol (VITAMIN D) 1000 units tablet Take 2 tablets (2,000 Units total) by mouth daily after lunch. (Patient taking differently: Take 1,000  Units by mouth daily after lunch. ) 30 tablet 0  . Coenzyme Q10 (COQ10) 100 MG CAPS Take 100 mg daily by mouth.     . docusate sodium (COLACE) 100 MG capsule Take 1 capsule (100 mg total) by mouth 2 (two) times daily. 10 capsule 0  . esomeprazole (NEXIUM 24HR) 20 MG capsule Take 1 capsule (20 mg total) by mouth daily at 12 noon. 90 capsule 1  . ferrous sulfate 325 (65 FE) MG tablet Take 1 tablet (325 mg total) by mouth 2 (two) times daily with a meal. 60 tablet 3  . gabapentin (NEURONTIN) 300 MG capsule Take 1 capsule (300 mg total) by mouth 3 (three) times daily. 270 capsule 1  . Garlic 193 MG CAPS Take 500 mg 2 (two) times daily by mouth.    Marland Kitchen lisinopril (PRINIVIL,ZESTRIL) 10 MG tablet     . metFORMIN (GLUCOPHAGE) 500 MG tablet TAKE 1 TABLET(500 MG) BY MOUTH TWICE DAILY WITH A MEAL 180 tablet 0  . oxybutynin (OXYTROL) 3.9 MG/24HR Place 1 patch onto the skin 5 days.     . polyethylene glycol (MIRALAX / GLYCOLAX) packet Take 17 g by mouth daily. 14 each 0  . senna-docusate (SENOKOT-S) 8.6-50 MG tablet Take 2 tablets by mouth 2 (two) times daily. (Patient taking differently: Take 1-2 tablets by mouth daily as needed for mild constipation. )    . traMADol (ULTRAM) 50 MG tablet Take 1 tablet by mouth once daily as needed for severe pain. 30 tablet 0   No current facility-administered medications on file prior to visit.     BP 132/84   Pulse 70   Temp 98.1 F (36.7 C) (Oral)   Ht 5\' 10"  (1.778 m)   Wt 162 lb 12 oz (73.8 kg)   SpO2 97%   BMI 23.35 kg/m    Objective:   Physical Exam  Constitutional: She appears well-nourished.  Neck: Neck supple.  Cardiovascular: Normal rate and regular rhythm.  Respiratory: Effort normal and breath sounds normal.  Musculoskeletal:       Hands: Pain to left PIP joint and MCP joint.  Mild swelling on exam. No erythema. Mild decrease in ROM with flexion.  Skin: Skin is warm and dry.  Psychiatric: She has a normal mood and affect.             Assessment & Plan:

## 2018-07-17 NOTE — Assessment & Plan Note (Signed)
Doing well and using walker with good stability. Continue outpatient PT.

## 2018-07-17 NOTE — Assessment & Plan Note (Signed)
Significant improvement overall. Will have her use baclofen PRN as this is covered by insurance. Discontinued methocarbamol.

## 2018-07-17 NOTE — Patient Instructions (Addendum)
Stop by the lab prior to leaving today. I will notify you of your results once received.   Continue Metformin 500 mg twice daily for diabetes.  Continue to work on [employer] and continue to work on regular activity.  Please schedule a physical with me in 6 months. You may also schedule a lab only appointment 3-4 days prior. We will discuss your lab results in detail during your physical.  It was a pleasure to see you today!

## 2018-07-18 ENCOUNTER — Ambulatory Visit: Payer: PPO | Admitting: Physical Therapy

## 2018-07-18 LAB — CBC
HCT: 38.1 % (ref 36.0–46.0)
Hemoglobin: 12.9 g/dL (ref 12.0–15.0)
MCHC: 33.9 g/dL (ref 30.0–36.0)
MCV: 85.5 fl (ref 78.0–100.0)
PLATELETS: 212 10*3/uL (ref 150.0–400.0)
RBC: 4.46 Mil/uL (ref 3.87–5.11)
RDW: 14.5 % (ref 11.5–15.5)
WBC: 6.1 10*3/uL (ref 4.0–10.5)

## 2018-07-18 LAB — CYCLIC CITRUL PEPTIDE ANTIBODY, IGG: Cyclic Citrullin Peptide Ab: <16 UNITS

## 2018-07-18 LAB — HEMOGLOBIN A1C: Hgb A1c MFr Bld: 6.2 % (ref 4.6–6.5)

## 2018-07-18 LAB — IBC PANEL
IRON: 67 ug/dL (ref 42–145)
Saturation Ratios: 15.4 % — ABNORMAL LOW (ref 20.0–50.0)
Transferrin: 311 mg/dL (ref 212.0–360.0)

## 2018-07-18 LAB — SEDIMENTATION RATE: SED RATE: 27 mm/h (ref 0–30)

## 2018-07-18 LAB — RHEUMATOID FACTOR

## 2018-07-19 DIAGNOSIS — M549 Dorsalgia, unspecified: Secondary | ICD-10-CM | POA: Diagnosis not present

## 2018-07-19 DIAGNOSIS — M5416 Radiculopathy, lumbar region: Secondary | ICD-10-CM | POA: Diagnosis not present

## 2018-07-19 DIAGNOSIS — M4316 Spondylolisthesis, lumbar region: Secondary | ICD-10-CM | POA: Diagnosis not present

## 2018-07-19 DIAGNOSIS — M5124 Other intervertebral disc displacement, thoracic region: Secondary | ICD-10-CM | POA: Diagnosis not present

## 2018-07-20 ENCOUNTER — Ambulatory Visit: Payer: PPO | Admitting: Physical Therapy

## 2018-07-24 ENCOUNTER — Other Ambulatory Visit: Payer: Self-pay | Admitting: Primary Care

## 2018-07-24 DIAGNOSIS — I10 Essential (primary) hypertension: Secondary | ICD-10-CM

## 2018-07-25 ENCOUNTER — Ambulatory Visit: Payer: PPO | Admitting: Physical Therapy

## 2018-07-25 NOTE — Telephone Encounter (Signed)
Last prescribed on 06/02/2017 Last office visit on 07/17/2018.  Patient is not out of medication, she has a few more days.

## 2018-07-25 NOTE — Telephone Encounter (Signed)
Noted, refills sent to pharmacy. 

## 2018-07-28 ENCOUNTER — Ambulatory Visit: Payer: PPO | Admitting: Physical Therapy

## 2018-08-01 ENCOUNTER — Ambulatory Visit: Payer: PPO | Attending: Physical Medicine & Rehabilitation | Admitting: Physical Therapy

## 2018-08-01 DIAGNOSIS — M6281 Muscle weakness (generalized): Secondary | ICD-10-CM | POA: Diagnosis not present

## 2018-08-01 DIAGNOSIS — R2681 Unsteadiness on feet: Secondary | ICD-10-CM | POA: Insufficient documentation

## 2018-08-01 DIAGNOSIS — R2689 Other abnormalities of gait and mobility: Secondary | ICD-10-CM | POA: Insufficient documentation

## 2018-08-01 NOTE — Therapy (Signed)
[facility] [address] [place], [place], [address] Phone: [phone]   Fax:  [phone]  Physical Therapy Treatment  Patient Details  Name: Megan [name] MRN: [id number] Date of Birth: 19-Mar-1951 Referring Provider (PT): Dr. Vertell Limber   Encounter Date: 08/01/2018  PT End of Session - 08/01/18 1629    Visit Number  16    Number of Visits  27    Date for PT Re-Evaluation  08/21/18    Authorization Type  Medicare    PT Start Time  1443    PT Stop Time  1615    PT Time Calculation (min)  44 min    Equipment Utilized During Treatment  Gait belt    Activity Tolerance  Patient tolerated treatment well    Behavior During Therapy  [facility] for tasks assessed/performed       Past Medical History:  Diagnosis Date  . Arthritis    "hands, back" (07/26/2017)  . Dyspnea    W/ PHYS CONDITION   . GERD (gastroesophageal reflux disease)   . History of bronchitis    "not since I quit smoking" (07/26/2017)  . History of kidney stones   . History of shingles   . Hyperlipidemia    takes Fish Oil daily  . Hypertension   . Neuromuscular disorder (HCC)    tingling toes  . 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[place]) 12/15/2016  . Pneumonia 2009  . PONV (postoperative nausea and vomiting)   . Restless leg   . Type 2 diabetes mellitus ([place])   . Uterine cancer ([place]) 1979   S/P hysterectomy  . Weakness    numbness and tingling in both feet r/t back    Past Surgical History:  Procedure Laterality Date  . ABDOMINAL EXPOSURE N/A 07/26/2017   Procedure: ABDOMINAL EXPOSURE;  Surgeon: Angelia Mould, MD;  Location: [facility];  Service: Vascular;  Laterality: N/A;  . ANTERIOR CERVICAL DECOMP/DISCECTOMY FUSION  2001  . ANTERIOR LUMBAR FUSION N/A 07/26/2017   Procedure: Lumbar five-Sacral one Anterior lumbar interbody fusion with Dr. Deitra Mayo for approach;  Surgeon: Ditty, Kevan Ny, MD;  Location: [facility];  Service:  Neurosurgery;  Laterality: N/A;  . APPLICATION OF ROBOTIC ASSISTANCE FOR SPINAL PROCEDURE  12/19/2016   Procedure: APPLICATION OF ROBOTIC ASSISTANCE FOR SPINAL PROCEDURE;  Surgeon: Kevan Ny Ditty, MD;  Location: [facility];  Service: Neurosurgery;;  . APPLICATION OF ROBOTIC ASSISTANCE FOR SPINAL PROCEDURE N/A 07/26/2017   Procedure: APPLICATION OF ROBOTIC ASSISTANCE FOR SPINAL PROCEDURE;  Surgeon: Ditty, Kevan Ny, MD;  Location: [facility];  Service: Neurosurgery;  Laterality: N/A;  . APPLICATION OF ROBOTIC ASSISTANCE FOR SPINAL PROCEDURE N/A 09/02/2017   Procedure: APPLICATION OF ROBOTIC ASSISTANCE FOR SPINAL PROCEDURE;  Surgeon: Ditty, Kevan Ny, MD;  Location: [facility];  Service: Neurosurgery;  Laterality: N/A;  . BACK SURGERY    . BLADDER SUSPENSION  1991   tack  . INGUINAL HERNIA REPAIR Right 1991  . LUMBAR FUSION  2016; 2017; 07/26/2017   L4-5; L2-3; L5-S1  . POSTERIOR LUMBAR FUSION 4 WITH HARDWARE REMOVAL N/A 09/02/2017   Procedure: Lumbar three-four redo laminectomy; Repositioning of Left Sacral two screw; Extension of lumbar fusion to Thoracic twelve;  Surgeon: Ditty, Kevan Ny, MD;  Location: [facility];  Service: Neurosurgery;  Laterality: N/A;  . TONSILLECTOMY AND ADENOIDECTOMY  1959  . TUMOR EXCISION     WERTHIN'S TUMORS BOTH SIDES OF NECK  . VAGINAL HYSTERECTOMY  1979    There were no  vitals filed for this visit.  Subjective Assessment - 08/01/18 1536    Subjective  No falls to report. She goes thursday for her swedish knee cage.    Patient is accompained by:  Family member    Pertinent History  HTN, DM2, left T10 costotransversectomy, T10-T11 laminectomy for decompression, pedicle screws, T9-T12 (4/18), L5-S1 anterior retroperitoneal exposure, L5-S1 laminectomy, L5-S1 posterior lateral fixation with fusion (11/18); chronic pain    Limitations  Standing;Walking    How long can you walk comfortably?  household distances, limited community ambulation with RW vs rollator     Patient Stated Goals  improve balance and transition to use of cane    Currently in Pain?  Yes    Pain Score  2     Pain Location  Back    Pain Orientation  Lower;Right    Pain Descriptors / Indicators  Sharp    Pain Type  Chronic pain    Pain Onset  More than a month ago    Pain Frequency  Occasional    Aggravating Factors   twisting    Pain Relieving Factors  medication                       [facility] Adult PT Treatment/Exercise - 08/01/18 1540      Transfers   Transfers  Sit to Stand;Stand to Sit    Sit to Stand  6: Modified independent (Device/Increase time)    Sit to Stand Details (indicate cue type and reason)  Pt performs with increased time and rollator for stability.    Stand to Sit  6: Modified independent (Device/Increase time)    Comments  Pt used BUE to perform transfer from mat table to rollator for stability.      Ambulation/Gait   Ambulation/Gait  Yes    Ambulation/Gait Assistance  5: Supervision    Ambulation Distance (Feet)  345 Feet    Assistive device  Rollator    Gait Pattern  Step-through pattern;Decreased stride length;Decreased stance time - right;Decreased step length - left;Decreased dorsiflexion - right;Decreased weight shift to right;Right genu recurvatum    Ambulation Surface  Level;Indoor    Gait Comments  Pt was given VCs for decreased use of UE. Swedish knee cage was used during gait and pt did not experience any knee buckling during treatment. Pt was also given VCs to increase R hip flexion during gait.      High Level Balance   High Level Balance Activities  Other (comment)    High Level Balance Comments  In // bars: Pt performed standing balance on level surface progressing from static balance to horozontal/vertical head turns, and the eyes closed for 30 sec each without use of UE; Pt performed fwd/lateral step ups on 4" board x 10 reps BLE; Stepping strategy anterior/posterior BLE x 10 reps each with finger tip support; lateral stepping  x 3 laps;  tandem walking x 2 laps.   Pt required one seated rest break and VCs to decrease UE use         PT Short Term Goals - 07/05/18 1110      PT SHORT TERM GOAL #1   Title  patient to be independent with initial HEP for strength, balance and gait. (All STGs due 07/04/18)    Baseline  07/05/18: pt is independent with current HEP    Time  --    Period  --    Status  Achieved  PT SHORT TERM GOAL #2   Title  Patient to demonstrate TUG with rollator to </= 16 seconds for reduced fall risk    Baseline  07/05/18: 18.19 sec's with rollator, improved just not to goal    Time  --    Period  --    Status  Partially Met      PT SHORT TERM GOAL #3   Title  Patient to demonstrate 400' gait on level surfaces with rollator at Mod I without LOB or overt instability    Baseline  07/05/18: met today with rollator    Time  --    Period  --    Status  Achieved      PT SHORT TERM GOAL #4   Title  Patient to perform sit to stand x 10 with unilateral UE support with Mod I demonstrating improved strength and efficiency of transfers    Baseline  07/05/18: performed 10 reps with single UE support on chair, needed rollator for stability upon standing, no physical assist needed.     Time  --    Period  --    Status  Achieved        PT Long Term Goals - 06/19/18 2140      PT LONG TERM GOAL #1   Title  Patient to be independent with final HEP for balance, strength, gait and other functional mobility. (All LTGs due 08/21/18)    Time  13    Period  Weeks    Status  On-going      PT LONG TERM GOAL #2   Title  patient to demonstrate gait with LRAD over various levels and surfaces for >/= 500' with MOD I without LOB or overt instability    Time  13    Period  Weeks    Status  On-going      PT LONG TERM GOAL #3   Title  Patient to improve TUG to </= 13 seconds with LRAD demonstrating reduced fall risk    Baseline  TUG rollator: 23.11 sec; TUG SBQC: 1.07 min    Time  13    Period   Weeks    Status  On-going      PT LONG TERM GOAL #4   Title  Patient to improve gait velocity with LRAD to at least 58f/sec for decreased fall risk    Baseline  05/23/18: 1.8 ft/sec with rollator    Time  13    Period  Weeks    Status  On-going      PT LONG TERM GOAL #5   Title  Paitent to improve R LE strength by >/= 1 manual muscle grade demonstrating improved strength and functional use    Baseline  05/23/18: R LE grossly 3-/5    Time  13    Period  Weeks    Status  On-going            Plan - 08/01/18 1629    Clinical Impression Statement  Treatment focused on gait with knee brace and high level balance activities.  Pt still demonstrated incresed UE use and r hip circumduction during gait. Pt would benefit from further physical therapy to increase strength, balance, and gait deficits.     Clinical Presentation  Evolving    Clinical Decision Making  Moderate    Rehab Potential  Good    PT Frequency  2x / week    PT Duration  Other (comment)    PT Treatment/Interventions  ADLs/Self Care Home Management;Cryotherapy;Electrical Stimulation;DME Instruction;Moist Heat;Gait training;Stair training;Functional mobility training;Therapeutic activities;Therapeutic exercise;Balance training;Patient/family education;Neuromuscular re-education;Manual techniques;Taping;Passive range of motion;Orthotic Fit/Training    PT Next Visit Plan   Gait training with LBQC/knee brace, continue to work on core strengthening/thoracic spine strengthening for improved posture, continue to work on high level balance activites/right LE strengthening;     PT Home Exercise Plan  [id number]    Consulted and Agree with Plan of Care  Patient       Patient will benefit from skilled therapeutic intervention in order to improve the following deficits and impairments:  Abnormal gait, Decreased activity tolerance, Decreased balance, Decreased safety awareness, Difficulty walking, Decreased strength, Decreased mobility,  Decreased coordination, Pain, Impaired sensation  Visit Diagnosis: Unsteadiness on feet  Muscle weakness (generalized)  Other abnormalities of gait and mobility     Problem List Patient Active Problem List   Diagnosis Date Noted  . Arthralgia of left hand 07/17/2018  . Debility   . Anemia of chronic disease   . Chronic pain syndrome   . Hypokalemia   . Acute lower UTI 09/04/2017  . L3 vertebral fracture ([place]) 09/01/2017  . Constipation due to pain medication   . Muscle spasms of both lower extremities   . Type 2 diabetes mellitus with peripheral neuropathy (HCC)   . Hypoalbuminemia due to protein-calorie malnutrition ([place])   . Radiculopathy 07/28/2017  . Lumbosacral spondylosis with radiculopathy 07/26/2017  . Paraplegia, incomplete ([place]) 01/07/2017  . 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/27/2016  . Myelopathy ([place]) 12/21/2016  . Neuropathic pain   . 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 Paraparesis ([place]) 12/15/2016  . Type 2 diabetes mellitus without complication, without long-term current use of insulin ([place]) 11/02/2016  . Hyperlipidemia 11/02/2016  . Essential hypertension 11/02/2016  . 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    Donnald Garre SPTA 08/01/2018, 4:35 PM  [facility] [address] [place], [place], [address] Phone: [phone]   Fax:  [phone]  Name: Megan [name] MRN: [id number] Date of Birth: 04-Oct-1950

## 2018-08-02 ENCOUNTER — Other Ambulatory Visit: Payer: Self-pay | Admitting: Primary Care

## 2018-08-02 DIAGNOSIS — M62838 Other muscle spasm: Secondary | ICD-10-CM

## 2018-08-04 ENCOUNTER — Encounter: Payer: Self-pay | Admitting: Physical Therapy

## 2018-08-04 ENCOUNTER — Ambulatory Visit: Payer: PPO | Admitting: Physical Therapy

## 2018-08-04 DIAGNOSIS — M6281 Muscle weakness (generalized): Secondary | ICD-10-CM

## 2018-08-04 DIAGNOSIS — R2681 Unsteadiness on feet: Secondary | ICD-10-CM

## 2018-08-04 DIAGNOSIS — R2689 Other abnormalities of gait and mobility: Secondary | ICD-10-CM

## 2018-08-04 NOTE — Therapy (Signed)
[facility] [address] [place], [place], [address] Phone: [phone]   Fax:  [phone]  Physical Therapy Treatment  Patient Details  Name: Megan [name] MRN: [id number] Date of Birth: 12/13/1950 Referring Provider (PT): Dr. Vertell Limber   Encounter Date: 08/04/2018  PT End of Session - 08/04/18 1548    Visit Number  17    Number of Visits  27    Date for PT Re-Evaluation  08/21/18    Authorization Type  Medicare    PT Start Time  1447    PT Stop Time  1530    PT Time Calculation (min)  43 min    Equipment Utilized During Treatment  Gait belt    Activity Tolerance  Patient tolerated treatment well    Behavior During Therapy  [facility] for tasks assessed/performed       Past Medical History:  Diagnosis Date  . Arthritis    "hands, back" (07/26/2017)  . Dyspnea    W/ PHYS CONDITION   . GERD (gastroesophageal reflux disease)   . History of bronchitis    "not since I quit smoking" (07/26/2017)  . History of kidney stones   . History of shingles   . Hyperlipidemia    takes Fish Oil daily  . Hypertension   . Neuromuscular disorder (HCC)    tingling toes  . 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[place]) 12/15/2016  . Pneumonia 2009  . PONV (postoperative nausea and vomiting)   . Restless leg   . Type 2 diabetes mellitus ([place])   . Uterine cancer ([place]) 1979   S/P hysterectomy  . Weakness    numbness and tingling in both feet r/t back    Past Surgical History:  Procedure Laterality Date  . ABDOMINAL EXPOSURE N/A 07/26/2017   Procedure: ABDOMINAL EXPOSURE;  Surgeon: Angelia Mould, MD;  Location: [place];  Service: Vascular;  Laterality: N/A;  . ANTERIOR CERVICAL DECOMP/DISCECTOMY FUSION  2001  . ANTERIOR LUMBAR FUSION N/A 07/26/2017   Procedure: Lumbar five-Sacral one Anterior lumbar interbody fusion with Dr. Deitra Mayo for approach;  Surgeon: Ditty, Kevan Ny, MD;  Location: [place];  Service:  Neurosurgery;  Laterality: N/A;  . APPLICATION OF ROBOTIC ASSISTANCE FOR SPINAL PROCEDURE  12/19/2016   Procedure: APPLICATION OF ROBOTIC ASSISTANCE FOR SPINAL PROCEDURE;  Surgeon: Kevan Ny Ditty, MD;  Location: [place];  Service: Neurosurgery;;  . APPLICATION OF ROBOTIC ASSISTANCE FOR SPINAL PROCEDURE N/A 07/26/2017   Procedure: APPLICATION OF ROBOTIC ASSISTANCE FOR SPINAL PROCEDURE;  Surgeon: Ditty, Kevan Ny, MD;  Location: [place];  Service: Neurosurgery;  Laterality: N/A;  . APPLICATION OF ROBOTIC ASSISTANCE FOR SPINAL PROCEDURE N/A 09/02/2017   Procedure: APPLICATION OF ROBOTIC ASSISTANCE FOR SPINAL PROCEDURE;  Surgeon: Ditty, Kevan Ny, MD;  Location: [place];  Service: Neurosurgery;  Laterality: N/A;  . BACK SURGERY    . BLADDER SUSPENSION  1991   tack  . INGUINAL HERNIA REPAIR Right 1991  . LUMBAR FUSION  2016; 2017; 07/26/2017   L4-5; L2-3; L5-S1  . POSTERIOR LUMBAR FUSION 4 WITH HARDWARE REMOVAL N/A 09/02/2017   Procedure: Lumbar three-four redo laminectomy; Repositioning of Left Sacral two screw; Extension of lumbar fusion to Thoracic twelve;  Surgeon: Ditty, Kevan Ny, MD;  Location: [place];  Service: Neurosurgery;  Laterality: N/A;  . TONSILLECTOMY AND ADENOIDECTOMY  1959  . TUMOR EXCISION     WERTHIN'S TUMORS BOTH SIDES OF NECK  . VAGINAL HYSTERECTOMY  1979    There were no  vitals filed for this visit.  Subjective Assessment - 08/04/18 1448    Subjective  No falls to report. States she went to get fitted for brace and it will take about 2 weeks to come in.     Patient is accompained by:  Family member    Pertinent History  HTN, DM2, left T10 costotransversectomy, T10-T11 laminectomy for decompression, pedicle screws, T9-T12 (4/18), L5-S1 anterior retroperitoneal exposure, L5-S1 laminectomy, L5-S1 posterior lateral fixation with fusion (11/18); chronic pain    Limitations  Standing;Walking    How long can you walk comfortably?  household distances, limited  community ambulation with RW vs rollator    Patient Stated Goals  improve balance and transition to use of cane    Currently in Pain?  No/denies    Pain Onset  More than a month ago           [facility] Adult PT Treatment/Exercise - 08/04/18 1530      Ambulation/Gait   Ambulation/Gait  Yes    Ambulation/Gait Assistance  5: Supervision    Ambulation/Gait Assistance Details  Used swedish knee cage on right. verbal cues for upright posture. Continues with heavy UE reliance on rollator.   Ambulation Distance (Feet)  170 Feet    Assistive device  Rollator    Gait Pattern  Step-through pattern;Decreased stride length;Decreased stance time - right;Decreased step length - left;Decreased dorsiflexion - right;Decreased weight shift to right;Right genu recurvatum    Ambulation Surface  Level;Indoor      Neuro Re-ed    Neuro Re-ed Details   In parallel bars: feet apart on airex with no UE support with supervision x30 sec; toe taps to 2in platform without UE support x10 reps with min A and verbal cues for RLE clearance; forward and side stepping on blue mat without UE support with min guard and verbal cues to increase step length and raise RLE higher; alternating stepping forward onto blue mat with single leg hold for 3 sec with min A.       Exercises   Exercises  Knee/Hip    Other Exercises   In parallel bars: mini squats, mini lunges foward and lateral. verbal cues and demo for proper exercise technique. with UE support. Standing hamstring curls RLE.       Ankle Exercises: Seated   Heel Raises  Both;10 reps   Standing With UE support   Toe Raise  10 reps            PT Short Term Goals - 07/05/18 1110      PT SHORT TERM GOAL #1   Title  patient to be independent with initial HEP for strength, balance and gait. (All STGs due 07/04/18)    Baseline  07/05/18: pt is independent with current HEP    Time  --    Period  --    Status  Achieved      PT SHORT TERM GOAL #2   Title  Patient to  demonstrate TUG with rollator to </= 16 seconds for reduced fall risk    Baseline  07/05/18: 18.19 sec's with rollator, improved just not to goal    Time  --    Period  --    Status  Partially Met      PT SHORT TERM GOAL #3   Title  Patient to demonstrate 400' gait on level surfaces with rollator at Mod I without LOB or overt instability    Baseline  07/05/18: met today with rollator  Time  --    Period  --    Status  Achieved      PT SHORT TERM GOAL #4   Title  Patient to perform sit to stand x 10 with unilateral UE support with Mod I demonstrating improved strength and efficiency of transfers    Baseline  07/05/18: performed 10 reps with single UE support on chair, needed rollator for stability upon standing, no physical assist needed.     Time  --    Period  --    Status  Achieved        PT Long Term Goals - 06/19/18 2140      PT LONG TERM GOAL #1   Title  Patient to be independent with final HEP for balance, strength, gait and other functional mobility. (All LTGs due 08/21/18)    Time  13    Period  Weeks    Status  On-going      PT LONG TERM GOAL #2   Title  patient to demonstrate gait with LRAD over various levels and surfaces for >/= 500' with MOD I without LOB or overt instability    Time  13    Period  Weeks    Status  On-going      PT LONG TERM GOAL #3   Title  Patient to improve TUG to </= 13 seconds with LRAD demonstrating reduced fall risk    Baseline  TUG rollator: 23.11 sec; TUG SBQC: 1.07 min    Time  13    Period  Weeks    Status  On-going      PT LONG TERM GOAL #4   Title  Patient to improve gait velocity with LRAD to at least 70f/sec for decreased fall risk    Baseline  05/23/18: 1.8 ft/sec with rollator    Time  13    Period  Weeks    Status  On-going      PT LONG TERM GOAL #5   Title  Paitent to improve R LE strength by >/= 1 manual muscle grade demonstrating improved strength and functional use    Baseline  05/23/18: R LE grossly 3-/5     Time  13    Period  Weeks    Status  On-going            Plan - 08/04/18 1549    Clinical Impression Statement  Today's treatment focused on gait with swedish knee brace, LE strengthening, and balance emphasizing single leg stabilizaiton and compliant surfaces. Pt should be getting knee brace in 2 weeks. Pt demonstrated improved double stance balance on airex pad requiring supervision. Pt able to tolerate LE strengthening without complication and required 1 seated rest break. Pt would benefit from further PT to continue to address strength, balance and gait deficits.     Rehab Potential  Good    PT Frequency  2x / week    PT Duration  Other (comment)    PT Treatment/Interventions  ADLs/Self Care Home Management;Cryotherapy;Electrical Stimulation;DME Instruction;Moist Heat;Gait training;Stair training;Functional mobility training;Therapeutic activities;Therapeutic exercise;Balance training;Patient/family education;Neuromuscular re-education;Manual techniques;Taping;Passive range of motion;Orthotic Fit/Training    PT Next Visit Plan continue to work on core strengthening/thoracic spine strengthening for improved posture, continue to work on high level balance activites/right LE strengthening;  ? Gait with lesser AD- start with single UE support on bars/counter progressing to cane as indicated/safe   PT [place] Agree with Plan of Care  Patient  Patient will benefit from skilled therapeutic intervention in order to improve the following deficits and impairments:  Abnormal gait, Decreased activity tolerance, Decreased balance, Decreased safety awareness, Difficulty walking, Decreased strength, Decreased mobility, Decreased coordination, Pain, Impaired sensation  Visit Diagnosis: Other abnormalities of gait and mobility  Muscle weakness (generalized)  Unsteadiness on feet     Problem List Patient Active Problem List   Diagnosis Date Noted  .  Arthralgia of left hand 07/17/2018  . Debility   . Anemia of chronic disease   . Chronic pain syndrome   . Hypokalemia   . Acute lower UTI 09/04/2017  . L3 vertebral fracture ([place]) 09/01/2017  . Constipation due to pain medication   . Muscle spasms of both lower extremities   . Type 2 diabetes mellitus with peripheral neuropathy (HCC)   . Hypoalbuminemia due to protein-calorie malnutrition ([name])   . Radiculopathy 07/28/2017  . Lumbosacral spondylosis with radiculopathy 07/26/2017  . Paraplegia, incomplete ([place]) 01/07/2017  . 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/27/2016  . Myelopathy ([place]) 12/21/2016  . Neuropathic pain   . 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 Paraparesis ([place]) 12/15/2016  . Type 2 diabetes mellitus without complication, without long-term current use of insulin ([place]) 11/02/2016  . Hyperlipidemia 11/02/2016  . Essential hypertension 11/02/2016  . 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    Cecile Sheerer, SPTA 08/04/2018, 3:55 PM  [facility] [address] [place], [place], [address] Phone: [phone]   Fax:  [phone]  Name: Megan [name] MRN: [id number] Date of Birth: 06-11-1951

## 2018-08-08 ENCOUNTER — Encounter: Payer: Self-pay | Admitting: Physical Therapy

## 2018-08-08 ENCOUNTER — Ambulatory Visit: Payer: PPO | Admitting: Physical Therapy

## 2018-08-08 DIAGNOSIS — R2681 Unsteadiness on feet: Secondary | ICD-10-CM | POA: Diagnosis not present

## 2018-08-08 DIAGNOSIS — M6281 Muscle weakness (generalized): Secondary | ICD-10-CM

## 2018-08-08 DIAGNOSIS — R2689 Other abnormalities of gait and mobility: Secondary | ICD-10-CM

## 2018-08-08 NOTE — Therapy (Signed)
[facility] [address] [place], [place], [address] Phone: [phone]   Fax:  [phone]  Physical Therapy Treatment  Patient Details  Name: JADIA CAPERS MRN: [id number] Date of Birth: [birth year] Referring Provider (PT): Dr. Vertell Limber   Encounter Date: 08/08/2018  PT End of Session - 08/08/18 1628    Visit Number  18    Number of Visits  27    Date for PT Re-Evaluation  08/21/18    Authorization Type  Medicare    PT Start Time  2423    PT Stop Time  5361    PT Time Calculation (min)  43 min    Equipment Utilized During Treatment  Gait belt    Activity Tolerance  Patient tolerated treatment well    Behavior During Therapy  [facility] for tasks assessed/performed       Past Medical History:  Diagnosis Date  . Arthritis    "hands, back" (07/26/2017)  . Dyspnea    W/ PHYS CONDITION   . GERD (gastroesophageal reflux disease)   . History of bronchitis    "not since I quit smoking" (07/26/2017)  . History of kidney stones   . History of shingles   . Hyperlipidemia    takes Fish Oil daily  . Hypertension   . Neuromuscular disorder (HCC)    tingling toes  . 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[place]) 12/15/2016  . Pneumonia 2009  . PONV (postoperative nausea and vomiting)   . Restless leg   . Type 2 diabetes mellitus ([place])   . Uterine cancer ([place]) 1979   S/P hysterectomy  . Weakness    numbness and tingling in both feet r/t back    Past Surgical History:  Procedure Laterality Date  . ABDOMINAL EXPOSURE N/A 07/26/2017   Procedure: ABDOMINAL EXPOSURE;  Surgeon: Angelia Mould, MD;  Location: [place];  Service: Vascular;  Laterality: N/A;  . ANTERIOR CERVICAL DECOMP/DISCECTOMY FUSION  2001  . ANTERIOR LUMBAR FUSION N/A 07/26/2017   Procedure: Lumbar five-Sacral one Anterior lumbar interbody fusion with Dr. Deitra Mayo for approach;  Surgeon: Ditty, Kevan Ny, MD;  Location: [facility];  Service:  Neurosurgery;  Laterality: N/A;  . APPLICATION OF ROBOTIC ASSISTANCE FOR SPINAL PROCEDURE  12/19/2016   Procedure: APPLICATION OF ROBOTIC ASSISTANCE FOR SPINAL PROCEDURE;  Surgeon: Kevan Ny Ditty, MD;  Location: [place];  Service: Neurosurgery;;  . APPLICATION OF ROBOTIC ASSISTANCE FOR SPINAL PROCEDURE N/A 07/26/2017   Procedure: APPLICATION OF ROBOTIC ASSISTANCE FOR SPINAL PROCEDURE;  Surgeon: Ditty, Kevan Ny, MD;  Location: [place];  Service: Neurosurgery;  Laterality: N/A;  . APPLICATION OF ROBOTIC ASSISTANCE FOR SPINAL PROCEDURE N/A 09/02/2017   Procedure: APPLICATION OF ROBOTIC ASSISTANCE FOR SPINAL PROCEDURE;  Surgeon: Ditty, Kevan Ny, MD;  Location: [place];  Service: Neurosurgery;  Laterality: N/A;  . BACK SURGERY    . BLADDER SUSPENSION  1991   tack  . INGUINAL HERNIA REPAIR Right 1991  . LUMBAR FUSION  2016; 2017; 07/26/2017   L4-5; L2-3; L5-S1  . POSTERIOR LUMBAR FUSION 4 WITH HARDWARE REMOVAL N/A 09/02/2017   Procedure: Lumbar three-four redo laminectomy; Repositioning of Left Sacral two screw; Extension of lumbar fusion to Thoracic twelve;  Surgeon: Ditty, Kevan Ny, MD;  Location: [facility];  Service: Neurosurgery;  Laterality: N/A;  . TONSILLECTOMY AND ADENOIDECTOMY  1959  . TUMOR EXCISION     WERTHIN'S TUMORS BOTH SIDES OF NECK  . VAGINAL HYSTERECTOMY  1979    There were no  vitals filed for this visit.  Subjective Assessment - 08/08/18 1533    Subjective  doing well - trying to be aware of posturing    Pertinent History  HTN, DM2, left T10 costotransversectomy, T10-T11 laminectomy for decompression, pedicle screws, T9-T12 (4/18), L5-S1 anterior retroperitoneal exposure, L5-S1 laminectomy, L5-S1 posterior lateral fixation with fusion (11/18); chronic pain    Patient Stated Goals  improve balance and transition to use of cane    Currently in Pain?  No/denies    Pain Score  0-No pain                       [facility] Adult PT Treatment/Exercise -  08/08/18 0001      Ambulation/Gait   Ambulation/Gait  Yes    Ambulation/Gait Assistance  4: Min guard;4: Min assist    Ambulation/Gait Assistance Details  swedish knee cage + quad tip cane focusing on step through patterna dn equlat step length; intermittent Min A for steadying and balance; downward gaze even with cueing for posturing    Assistive device  --   quad tip cane   Gait Pattern  Step-to pattern;Step-through pattern;Decreased step length - left;Decreased stance time - right    Ambulation Surface  Level;Indoor    Gait Comments  in // bars x 4 reps; around PT gym x 2; up/down incline x 2; figure 8 around 2 hula hoops x 4 reps - requires seated rest break and intermittent Min A               PT Short Term Goals - 07/05/18 1110      PT SHORT TERM GOAL #1   Title  patient to be independent with initial HEP for strength, balance and gait. (All STGs due 07/04/18)    Baseline  07/05/18: pt is independent with current HEP    Time  --    Period  --    Status  Achieved      PT SHORT TERM GOAL #2   Title  Patient to demonstrate TUG with rollator to </= 16 seconds for reduced fall risk    Baseline  07/05/18: 18.19 sec's with rollator, improved just not to goal    Time  --    Period  --    Status  Partially Met      PT SHORT TERM GOAL #3   Title  Patient to demonstrate 400' gait on level surfaces with rollator at Mod I without LOB or overt instability    Baseline  07/05/18: met today with rollator    Time  --    Period  --    Status  Achieved      PT SHORT TERM GOAL #4   Title  Patient to perform sit to stand x 10 with unilateral UE support with Mod I demonstrating improved strength and efficiency of transfers    Baseline  07/05/18: performed 10 reps with single UE support on chair, needed rollator for stability upon standing, no physical assist needed.     Time  --    Period  --    Status  Achieved        PT Long Term Goals - 06/19/18 2140      PT LONG TERM GOAL  #1   Title  Patient to be independent with final HEP for balance, strength, gait and other functional mobility. (All LTGs due 08/21/18)    Time  13    Period  Weeks    Status  On-going      PT LONG TERM GOAL #2   Title  patient to demonstrate gait with LRAD over various levels and surfaces for >/= 500' with MOD I without LOB or overt instability    Time  13    Period  Weeks    Status  On-going      PT LONG TERM GOAL #3   Title  Patient to improve TUG to </= 13 seconds with LRAD demonstrating reduced fall risk    Baseline  TUG rollator: 23.11 sec; TUG SBQC: 1.07 min    Time  13    Period  Weeks    Status  On-going      PT LONG TERM GOAL #4   Title  Patient to improve gait velocity with LRAD to at least 63f/sec for decreased fall risk    Baseline  05/23/18: 1.8 ft/sec with rollator    Time  13    Period  Weeks    Status  On-going      PT LONG TERM GOAL #5   Title  Paitent to improve R LE strength by >/= 1 manual muscle grade demonstrating improved strength and functional use    Baseline  05/23/18: R LE grossly 3-/5    Time  13    Period  Weeks    Status  On-going            Plan - 08/08/18 1628    Clinical Impression Statement  Session today focusing on gait training wtih SPC + swedish knee cage. PT encouraging patient on step through pattern with equal step length as patient tends to step to pattern with L LE. Very slow and cautious gait pattern but with patient very motivated to progress. Will plan on progress/recert note at next visit.    Rehab Potential  Good    PT Frequency  2x / week    PT Duration  Other (comment)    PT Treatment/Interventions  ADLs/Self Care Home Management;Cryotherapy;Electrical Stimulation;DME Instruction;Moist Heat;Gait training;Stair training;Functional mobility training;Therapeutic activities;Therapeutic exercise;Balance training;Patient/family education;Neuromuscular re-education;Manual techniques;Taping;Passive range of motion;Orthotic  Fit/Training    PT Next Visit Plan  Gait training with LBQC/knee brace, continue to work on core strengthening/thoracic spine strengthening for improved posture, continue to work on high level balance activites/right LE strengthening;     PT Home Exercise Plan  [id number]    Consulted and Agree with Plan of Care  Patient       Patient will benefit from skilled therapeutic intervention in order to improve the following deficits and impairments:  Abnormal gait, Decreased activity tolerance, Decreased balance, Decreased safety awareness, Difficulty walking, Decreased strength, Decreased mobility, Decreased coordination, Pain, Impaired sensation  Visit Diagnosis: Unsteadiness on feet  Other abnormalities of gait and mobility  Muscle weakness (generalized)     Problem List Patient Active Problem List   Diagnosis Date Noted  . Arthralgia of left hand 07/17/2018  . Debility   . Anemia of chronic disease   . Chronic pain syndrome   . Hypokalemia   . Acute lower UTI 09/04/2017  . L3 vertebral fracture ([place] 09/01/2017  . Constipation due to pain medication   . Muscle spasms of both lower extremities   . Type 2 diabetes mellitus with peripheral neuropathy (HCC)   . Hypoalbuminemia due to protein-calorie malnutrition ([place]   . Radiculopathy 07/28/2017  . Lumbosacral spondylosis with radiculopathy 07/26/2017  . Paraplegia, incomplete ([place] 01/07/2017  . 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/27/2016  . [place]elopathy ([place] [facility] [facility] 12/21/2016  . Neuropathic pain   .  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 Paraparesis ([place]) 12/15/2016  . Type 2 diabetes mellitus without complication, without long-term current use of insulin ([place]) 11/02/2016  . Hyperlipidemia 11/02/2016  . Essential hypertension 11/02/2016  . 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     Lanney Gins, PT, DPT Supplemental Physical Therapist 08/08/18 4:34  PM Pager: [phone] Office: [facility] [address] [place], [place], [address] Phone: [phone]   Fax:  [phone]  Name: JUDEE HENNICK MRN: [id number] Date of Birth: Apr 23, 1951

## 2018-08-09 ENCOUNTER — Encounter: Payer: Self-pay | Admitting: Physical Therapy

## 2018-08-09 ENCOUNTER — Ambulatory Visit: Payer: PPO | Admitting: Physical Therapy

## 2018-08-09 DIAGNOSIS — R2689 Other abnormalities of gait and mobility: Secondary | ICD-10-CM

## 2018-08-09 DIAGNOSIS — M6281 Muscle weakness (generalized): Secondary | ICD-10-CM

## 2018-08-09 DIAGNOSIS — R2681 Unsteadiness on feet: Secondary | ICD-10-CM | POA: Diagnosis not present

## 2018-08-09 NOTE — Therapy (Signed)
[facility] [address] [place], [place], [address] Phone: [phone]   Fax:  [phone]  Physical Therapy Treatment  Patient Details  Name: Megan [name] MRN: [id number] Date of Birth: 06-14-1951 Referring Provider (PT): Dr. Vertell Limber  Progress Note Reporting Period 06/28/18 to 08/09/18  See note below for Objective Data and Assessment of Progress/Goals.      Encounter Date: 08/09/2018  PT End of Session - 08/09/18 1530    Visit Number  19    Number of Visits  27    Date for PT Re-Evaluation  09/20/18    Authorization Type  Medicare    PT Start Time  1528    PT Stop Time  1610    PT Time Calculation (min)  42 min    Equipment Utilized During Treatment  Gait belt    Activity Tolerance  Patient tolerated treatment well    Behavior During Therapy  WFL for tasks assessed/performed       Past Medical History:  Diagnosis Date  . Arthritis    "hands, back" (07/26/2017)  . Dyspnea    W/ PHYS CONDITION   . GERD (gastroesophageal reflux disease)   . History of bronchitis    "not since I quit smoking" (07/26/2017)  . History of kidney stones   . History of shingles   . Hyperlipidemia    takes Fish Oil daily  . Hypertension   . Neuromuscular disorder (HCC)    tingling toes  . 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[place]) 12/15/2016  . Pneumonia 2009  . PONV (postoperative nausea and vomiting)   . Restless leg   . Type 2 diabetes mellitus ([facility])   . Uterine cancer ([place] [facility]) 1979   S/P hysterectomy  . Weakness    numbness and tingling in both feet r/t back    Past Surgical History:  Procedure Laterality Date  . ABDOMINAL EXPOSURE N/A 07/26/2017   Procedure: ABDOMINAL EXPOSURE;  Surgeon: Angelia Mould, MD;  Location: [facility];  Service: Vascular;  Laterality: N/A;  . ANTERIOR CERVICAL DECOMP/DISCECTOMY FUSION  2001  . ANTERIOR LUMBAR FUSION N/A 07/26/2017   Procedure: Lumbar five-Sacral one Anterior lumbar  interbody fusion with Dr. Deitra Mayo for approach;  Surgeon: Ditty, Kevan Ny, MD;  Location: [facility];  Service: Neurosurgery;  Laterality: N/A;  . APPLICATION OF ROBOTIC ASSISTANCE FOR SPINAL PROCEDURE  12/19/2016   Procedure: APPLICATION OF ROBOTIC ASSISTANCE FOR SPINAL PROCEDURE;  Surgeon: Kevan Ny Ditty, MD;  Location: [facility];  Service: Neurosurgery;;  . APPLICATION OF ROBOTIC ASSISTANCE FOR SPINAL PROCEDURE N/A 07/26/2017   Procedure: APPLICATION OF ROBOTIC ASSISTANCE FOR SPINAL PROCEDURE;  Surgeon: Ditty, Kevan Ny, MD;  Location: [facility];  Service: Neurosurgery;  Laterality: N/A;  . APPLICATION OF ROBOTIC ASSISTANCE FOR SPINAL PROCEDURE N/A 09/02/2017   Procedure: APPLICATION OF ROBOTIC ASSISTANCE FOR SPINAL PROCEDURE;  Surgeon: Ditty, Kevan Ny, MD;  Location: [facility];  Service: Neurosurgery;  Laterality: N/A;  . BACK SURGERY    . BLADDER SUSPENSION  1991   tack  . INGUINAL HERNIA REPAIR Right 1991  . LUMBAR FUSION  2016; 2017; 07/26/2017   L4-5; L2-3; L5-S1  . POSTERIOR LUMBAR FUSION 4 WITH HARDWARE REMOVAL N/A 09/02/2017   Procedure: Lumbar three-four redo laminectomy; Repositioning of Left Sacral two screw; Extension of lumbar fusion to Thoracic twelve;  Surgeon: Ditty, Kevan Ny, MD;  Location: [facility];  Service: Neurosurgery;  Laterality: N/A;  . TONSILLECTOMY AND ADENOIDECTOMY  1959  . TUMOR EXCISION  WERTHIN'S TUMORS BOTH SIDES OF NECK  . VAGINAL HYSTERECTOMY  1979    There were no vitals filed for this visit.  Subjective Assessment - 08/09/18 1530    Subjective  doing well - no issues; had some weakness following last session yesterday    Patient is accompained by:  Family member    Pertinent History  HTN, DM2, left T10 costotransversectomy, T10-T11 laminectomy for decompression, pedicle screws, T9-T12 (4/18), L5-S1 anterior retroperitoneal exposure, L5-S1 laminectomy, L5-S1 posterior lateral fixation with fusion (11/18); chronic pain    How long  can you walk comfortably?  household distances, limited community ambulation with RW vs rollator    Patient Stated Goals  improve balance and transition to use of cane    Currently in Pain?  No/denies    Pain Score  0-No pain         [facility] PT Assessment - 08/09/18 0001      Assessment   Medical Diagnosis  Paraplegia    Referring Provider (PT)  Dr. Vertell Limber      Timed Up and Go Test   TUG  Normal TUG    Normal TUG (seconds)  18.06   with rollator                  [facility] Adult PT Treatment/Exercise - 08/09/18 0001      Ambulation/Gait   Ambulation/Gait  Yes    Ambulation/Gait Assistance  4: Min guard;4: Min assist    Ambulation/Gait Assistance Details  swedish knee cage - quad tip cane     Assistive device  --   quad tip cane   Gait Pattern  Step-to pattern;Step-through pattern;Decreased step length - left;Decreased stance time - right    Ambulation Surface  Level;Unlevel;Indoor    Gait velocity  2.02 ft/sec      Therapeutic Activites    Therapeutic Activities  Other Therapeutic Activities    Other Therapeutic Activities  gait with quad tip cane and swedish knee cage focusing on obstacle navigation and ambualting in small spaces to simulate home and community navigation.           Balance Exercises - 08/09/18 1556      Balance Exercises: Standing   Standing, One Foot on a Step  Eyes open;4 inch   R LE static stance - L LE ball rolls on step   Wall Bumps  Hip    Wall Bumps-Hips  Eyes opened;Anterior/posterior;15 reps          PT Short Term Goals - 07/05/18 1110      PT SHORT TERM GOAL #1   Title  patient to be independent with initial HEP for strength, balance and gait. (All STGs due 07/04/18)    Baseline  07/05/18: pt is independent with current HEP    Time  --    Period  --    Status  Achieved      PT SHORT TERM GOAL #2   Title  Patient to demonstrate TUG with rollator to </= 16 seconds for reduced fall risk    Baseline  07/05/18: 18.19 sec's  with rollator, improved just not to goal    Time  --    Period  --    Status  Partially Met      PT SHORT TERM GOAL #3   Title  Patient to demonstrate 400' gait on level surfaces with rollator at Mod I without LOB or overt instability    Baseline  07/05/18: met today with rollator  Time  --    Period  --    Status  Achieved      PT SHORT TERM GOAL #4   Title  Patient to perform sit to stand x 10 with unilateral UE support with Mod I demonstrating improved strength and efficiency of transfers    Baseline  07/05/18: performed 10 reps with single UE support on chair, needed rollator for stability upon standing, no physical assist needed.     Time  --    Period  --    Status  Achieved        PT Long Term Goals - 08/09/18 1531      PT LONG TERM GOAL #1   Title  Patient to be independent with final HEP for balance, strength, gait and other functional mobility. (All LTGs due 09/20/18)    Time  13    Period  Weeks    Status  On-going      PT LONG TERM GOAL #2   Title  patient to demonstrate gait with LRAD over various levels and surfaces for >/= 500' with MOD I without LOB or overt instability    Baseline  08/09/18 - supervision on compliant surfaces    Time  13    Period  Weeks    Status  On-going      PT LONG TERM GOAL #3   Title  Patient to improve TUG to </= 13 seconds with LRAD demonstrating reduced fall risk    Baseline  08/09/18: 18.06    Time  13    Period  Weeks    Status  On-going      PT LONG TERM GOAL #4   Title  Patient to improve gait velocity with LRAD to at least 8f/sec for decreased fall risk    Baseline  2.02 ft/sec - 08/09/18    Time  13    Period  Weeks    Status  Achieved      PT LONG TERM GOAL #5   Title  Paitent to improve R LE strength by >/= 1 manual muscle grade demonstrating improved strength and functional use    Baseline  05/23/18: R LE grossly 3-/5    Time  13    Period  Weeks    Status  Deferred            Plan - 08/09/18 1546     Clinical Impression Statement  Patient seen from 06/28/18 to 08/09/18 working on progressing safe and independent functional mobility with LRAD as well as training with swedish knee cage. Patient making good progress twoards LTGs with patient meeting gait speed today with rollator. Plan to extend POC date at this time to allow patient to be seen for previously established visits. Hopeful for patient to obtain own swedish knee cage and gait train with device. Making good progress towards goals. Will continue to progress towards previously established goals.     Rehab Potential  Good    PT Frequency  2x / week    PT Duration  Other (comment)    PT Treatment/Interventions  ADLs/Self Care Home Management;Cryotherapy;Electrical Stimulation;DME Instruction;Moist Heat;Gait training;Stair training;Functional mobility training;Therapeutic activities;Therapeutic exercise;Balance training;Patient/family education;Neuromuscular re-education;Manual techniques;Taping;Passive range of motion;Orthotic Fit/Training    PT Next Visit Plan  Gait training with LBQC/knee brace, continue to work on core strengthening/thoracic spine strengthening for improved posture, continue to work on high level balance activites/right LE strengthening;     [place]  and Agree with Plan of Care  Patient       Patient will benefit from skilled therapeutic intervention in order to improve the following deficits and impairments:  Abnormal gait, Decreased activity tolerance, Decreased balance, Decreased safety awareness, Difficulty walking, Decreased strength, Decreased mobility, Decreased coordination, Pain, Impaired sensation  Visit Diagnosis: Unsteadiness on feet  Other abnormalities of gait and mobility  Muscle weakness (generalized)     Problem List Patient Active Problem List   Diagnosis Date Noted  . Arthralgia of left hand 07/17/2018  . Debility   . Anemia of chronic disease   .  Chronic pain syndrome   . Hypokalemia   . Acute lower UTI 09/04/2017  . L3 vertebral fracture ([facility]) 09/01/2017  . Constipation due to pain medication   . Muscle spasms of both lower extremities   . Type 2 diabetes mellitus with peripheral neuropathy (HCC)   . Hypoalbuminemia due to protein-calorie malnutrition ([facility])   . Radiculopathy 07/28/2017  . Lumbosacral spondylosis with radiculopathy 07/26/2017  . Paraplegia, incomplete ([place]) 01/07/2017  . 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/27/2016  . Myelopathy ([place]) 12/21/2016  . Neuropathic pain   . 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 Paraparesis ([facility]) 12/15/2016  . Type 2 diabetes mellitus without complication, without long-term current use of insulin ([facility]) 11/02/2016  . Hyperlipidemia 11/02/2016  . Essential hypertension 11/02/2016  . 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    Lanney Gins, PT, DPT Supplemental Physical Therapist 08/09/18 4:17 PM Pager: [phone] Office: [facility] [address] [place], [place], [address] Phone: [phone]   Fax:  [phone]  Name: Megan [name] MRN: [id number] Date of Birth: 1950-12-07

## 2018-08-15 ENCOUNTER — Ambulatory Visit: Payer: PPO | Attending: Physical Medicine & Rehabilitation | Admitting: Physical Therapy

## 2018-08-15 DIAGNOSIS — M6281 Muscle weakness (generalized): Secondary | ICD-10-CM | POA: Diagnosis not present

## 2018-08-15 DIAGNOSIS — R2689 Other abnormalities of gait and mobility: Secondary | ICD-10-CM | POA: Insufficient documentation

## 2018-08-15 DIAGNOSIS — R2681 Unsteadiness on feet: Secondary | ICD-10-CM | POA: Insufficient documentation

## 2018-08-15 NOTE — Therapy (Signed)
[facility] [address] [place], [place], [address] Phone: [phone]   Fax:  [phone]  Physical Therapy Treatment  Patient Details  Name: Megan [name] MRN: [id number] Date of Birth: January 04, 1951 Referring Provider (PT): Dr. Vertell Limber   Encounter Date: 08/15/2018  PT End of Session - 08/15/18 1630    Visit Number  20    Number of Visits  27    Date for PT Re-Evaluation  09/20/18    Authorization Type  Medicare    PT Start Time  1532    PT Stop Time  1614    PT Time Calculation (min)  42 min    Equipment Utilized During Treatment  Gait belt    Activity Tolerance  Patient tolerated treatment well    Behavior During Therapy  [facility] for tasks assessed/performed       Past Medical History:  Diagnosis Date  . Arthritis    "hands, back" (07/26/2017)  . Dyspnea    W/ PHYS CONDITION   . GERD (gastroesophageal reflux disease)   . History of bronchitis    "not since I quit smoking" (07/26/2017)  . History of kidney stones   . History of shingles   . Hyperlipidemia    takes Fish Oil daily  . Hypertension   . Neuromuscular disorder (HCC)    tingling toes  . 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[facility]) 12/15/2016  . Pneumonia 2009  . PONV (postoperative nausea and vomiting)   . Restless leg   . Type 2 diabetes mellitus ([facility])   . Uterine cancer ([facility]) 1979   S/P hysterectomy  . Weakness    numbness and tingling in both feet r/t back    Past Surgical History:  Procedure Laterality Date  . ABDOMINAL EXPOSURE N/A 07/26/2017   Procedure: ABDOMINAL EXPOSURE;  Surgeon: Angelia Mould, MD;  Location: [facility];  Service: Vascular;  Laterality: N/A;  . ANTERIOR CERVICAL DECOMP/DISCECTOMY FUSION  2001  . ANTERIOR LUMBAR FUSION N/A 07/26/2017   Procedure: Lumbar five-Sacral one Anterior lumbar interbody fusion with Dr. Deitra Mayo for approach;  Surgeon: Ditty, Kevan Ny, MD;  Location: [facility];  Service:  Neurosurgery;  Laterality: N/A;  . APPLICATION OF ROBOTIC ASSISTANCE FOR SPINAL PROCEDURE  12/19/2016   Procedure: APPLICATION OF ROBOTIC ASSISTANCE FOR SPINAL PROCEDURE;  Surgeon: Kevan Ny Ditty, MD;  Location: [facility];  Service: Neurosurgery;;  . APPLICATION OF ROBOTIC ASSISTANCE FOR SPINAL PROCEDURE N/A 07/26/2017   Procedure: APPLICATION OF ROBOTIC ASSISTANCE FOR SPINAL PROCEDURE;  Surgeon: Ditty, Kevan Ny, MD;  Location: [facility];  Service: Neurosurgery;  Laterality: N/A;  . APPLICATION OF ROBOTIC ASSISTANCE FOR SPINAL PROCEDURE N/A 09/02/2017   Procedure: APPLICATION OF ROBOTIC ASSISTANCE FOR SPINAL PROCEDURE;  Surgeon: Ditty, Kevan Ny, MD;  Location: [facility];  Service: Neurosurgery;  Laterality: N/A;  . BACK SURGERY    . BLADDER SUSPENSION  1991   tack  . INGUINAL HERNIA REPAIR Right 1991  . LUMBAR FUSION  2016; 2017; 07/26/2017   L4-5; L2-3; L5-S1  . POSTERIOR LUMBAR FUSION 4 WITH HARDWARE REMOVAL N/A 09/02/2017   Procedure: Lumbar three-four redo laminectomy; Repositioning of Left Sacral two screw; Extension of lumbar fusion to Thoracic twelve;  Surgeon: Ditty, Kevan Ny, MD;  Location: [facility];  Service: Neurosurgery;  Laterality: N/A;  . TONSILLECTOMY AND ADENOIDECTOMY  1959  . TUMOR EXCISION     WERTHIN'S TUMORS BOTH SIDES OF NECK  . VAGINAL HYSTERECTOMY  1979    There were no  vitals filed for this visit.  Subjective Assessment - 08/15/18 1545    Subjective  Pt has not fallen and still awaiting her swedish knee cage.    Pertinent History  HTN, DM2, left T10 costotransversectomy, T10-T11 laminectomy for decompression, pedicle screws, T9-T12 (4/18), L5-S1 anterior retroperitoneal exposure, L5-S1 laminectomy, L5-S1 posterior lateral fixation with fusion (11/18); chronic pain    Limitations  Standing;Walking    How long can you walk comfortably?  household distances, limited community ambulation with RW vs rollator    Patient Stated Goals  improve balance and  transition to use of cane    Currently in Pain?  No/denies           [facility] Adult PT Treatment/Exercise - 08/15/18 1546      Ambulation/Gait   Ambulation/Gait  Yes    Ambulation/Gait Assistance  4: Min guard;4: Min assist    Ambulation/Gait Assistance Details  swedish knee case and R LE knee buckling causing LOB x2 requiring min assist to reseastablish balance.    Ambulation Distance (Feet)  115 Feet   115x1,75x1,50x1   Assistive device  Rollator;Straight cane    Gait Pattern  Step-to pattern;Step-through pattern;Decreased step length - left;Decreased stance time - right    Ambulation Surface  Level;Unlevel    Gait Comments  Pt ambulated through clinic with cane using a step to pattern for increased balance. Pt ambulated around turns doing figure eights to prmote functional ambulation. Pt required seated rest break after each trial of ambulation due to fatigue.      High Level Balance   High Level Balance Activities  Other (comment)    High Level Balance Comments  In // bars: Pt performed static standing balance while on compliant surface progressing to head turns horizontal/vertical, then to eyes closed. Pt had feet shoulder width apart and required intermittent use of BUE to maitain balance. Pt then performed stepping strategy while on compliant surface fwd with each LE and use of L UE to simulate use of cane.           PT Short Term Goals - 07/05/18 1110      PT SHORT TERM GOAL #1   Title  patient to be independent with initial HEP for strength, balance and gait. (All STGs due 07/04/18)    Baseline  07/05/18: pt is independent with current HEP    Time  --    Period  --    Status  Achieved      PT SHORT TERM GOAL #2   Title  Patient to demonstrate TUG with rollator to </= 16 seconds for reduced fall risk    Baseline  07/05/18: 18.19 sec's with rollator, improved just not to goal    Time  --    Period  --    Status  Partially Met      PT SHORT TERM GOAL #3   Title   Patient to demonstrate 400' gait on level surfaces with rollator at Mod I without LOB or overt instability    Baseline  07/05/18: met today with rollator    Time  --    Period  --    Status  Achieved      PT SHORT TERM GOAL #4   Title  Patient to perform sit to stand x 10 with unilateral UE support with Mod I demonstrating improved strength and efficiency of transfers    Baseline  07/05/18: performed 10 reps with single UE support on chair, needed rollator for stability upon  standing, no physical assist needed.     Time  --    Period  --    Status  Achieved        PT Long Term Goals - 08/09/18 1531      PT LONG TERM GOAL #1   Title  Patient to be independent with final HEP for balance, strength, gait and other functional mobility. (All LTGs due 09/20/18)    Time  13    Period  Weeks    Status  On-going      PT LONG TERM GOAL #2   Title  patient to demonstrate gait with LRAD over various levels and surfaces for >/= 500' with MOD I without LOB or overt instability    Baseline  08/09/18 - supervision on compliant surfaces    Time  13    Period  Weeks    Status  On-going      PT LONG TERM GOAL #3   Title  Patient to improve TUG to </= 13 seconds with LRAD demonstrating reduced fall risk    Baseline  08/09/18: 18.06    Time  13    Period  Weeks    Status  On-going      PT LONG TERM GOAL #4   Title  Patient to improve gait velocity with LRAD to at least 80f/sec for decreased fall risk    Baseline  2.02 ft/sec - 08/09/18    Time  13    Period  Weeks    Status  Achieved      PT LONG TERM GOAL #5   Title  Paitent to improve R LE strength by >/= 1 manual muscle grade demonstrating improved strength and functional use    Baseline  05/23/18: R LE grossly 3-/5    Time  13    Period  Weeks    Status  Deferred            Plan - 08/15/18 1631    Clinical Impression Statement  Treatment  today focused on gait training with use of cane and swedish knee cage with high level  balance exercises. Pt demonstrated mild unsteadiness during ambulation with cane due to knee buckling as pt became fatigued. Pt would benefit from further physical therpay to increase balance and functional independence.    Clinical Presentation  Evolving    Clinical Decision Making  Moderate    Rehab Potential  Good    PT Frequency  2x / week    PT Treatment/Interventions  ADLs/Self Care Home Management;Cryotherapy;Electrical Stimulation;DME Instruction;Moist Heat;Gait training;Stair training;Functional mobility training;Therapeutic activities;Therapeutic exercise;Balance training;Patient/family education;Neuromuscular re-education;Manual techniques;Taping;Passive range of motion;Orthotic Fit/Training    PT Next Visit Plan  Gait training with LBQC/knee brace, continue to work on core strengthening/thoracic spine strengthening for improved posture, continue to work on high level balance activites/right LE strengthening;     PT Home Exercise Plan  [id number]    Consulted and Agree with Plan of Care  Patient       Patient will benefit from skilled therapeutic intervention in order to improve the following deficits and impairments:  Abnormal gait, Decreased activity tolerance, Decreased balance, Decreased safety awareness, Difficulty walking, Decreased strength, Decreased mobility, Decreased coordination, Pain, Impaired sensation  Visit Diagnosis: Muscle weakness (generalized)  Unsteadiness on feet  Other abnormalities of gait and mobility     Problem List Patient Active Problem List   Diagnosis Date Noted  . Arthralgia of left hand 07/17/2018  . Debility   . Anemia of  chronic disease   . Chronic pain syndrome   . Hypokalemia   . Acute lower UTI 09/04/2017  . L3 vertebral fracture ([place]) 09/01/2017  . Constipation due to pain medication   . Muscle spasms of both lower extremities   . Type 2 diabetes mellitus with peripheral neuropathy (HCC)   . Hypoalbuminemia due to protein-calorie  malnutrition ([place])   . Radiculopathy 07/28/2017  . Lumbosacral spondylosis with radiculopathy 07/26/2017  . Paraplegia, incomplete ([place]) 01/07/2017  . 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/27/2016  . Myelopathy ([place]) 12/21/2016  . Neuropathic pain   . 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 Paraparesis ([place]) 12/15/2016  . Type 2 diabetes mellitus without complication, without long-term current use of insulin ([place]) 11/02/2016  . Hyperlipidemia 11/02/2016  . Essential hypertension 11/02/2016  . 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    Donnald Garre SPTA 08/15/2018, 4:33 PM  [facility] [address] [place], [place], [address] Phone: [phone]   Fax:  [phone]  Name: MCKENLEY BIRENBAUM MRN: [id number] Date of Birth: 01/12/1951

## 2018-08-17 ENCOUNTER — Ambulatory Visit: Payer: PPO

## 2018-08-18 ENCOUNTER — Ambulatory Visit: Payer: PPO

## 2018-08-18 DIAGNOSIS — M6281 Muscle weakness (generalized): Secondary | ICD-10-CM | POA: Diagnosis not present

## 2018-08-18 DIAGNOSIS — R2689 Other abnormalities of gait and mobility: Secondary | ICD-10-CM

## 2018-08-18 DIAGNOSIS — R2681 Unsteadiness on feet: Secondary | ICD-10-CM

## 2018-08-18 NOTE — Therapy (Signed)
[facility] [address] [place], [place], [address] Phone: [phone]   Fax:  [phone]  Physical Therapy Treatment  Patient Details  Name: Megan [name] MRN: [id number] Date of Birth: 1951/04/22 Referring Provider (PT): Dr. Macarthur Critchley Date: 08/18/2018  PT End of Session - 08/18/18 1535    Visit Number  21    Number of Visits  27    Date for PT Re-Evaluation  09/20/18    Authorization Type  Medicare    PT Start Time  1532    PT Stop Time  1620    PT Time Calculation (min)  48 min    Equipment Utilized During Treatment  Gait belt    Activity Tolerance  Patient tolerated treatment well    Behavior During Therapy  [facility] for tasks assessed/performed       Past Medical History:  Diagnosis Date  . Arthritis    "hands, back" (07/26/2017)  . Dyspnea    W/ PHYS CONDITION   . GERD (gastroesophageal reflux disease)   . History of bronchitis    "not since I quit smoking" (07/26/2017)  . History of kidney stones   . History of shingles   . Hyperlipidemia    takes Fish Oil daily  . Hypertension   . Neuromuscular disorder (HCC)    tingling toes  . 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[place]) 12/15/2016  . Pneumonia 2009  . PONV (postoperative nausea and vomiting)   . Restless leg   . Type 2 diabetes mellitus ([place])   . Uterine cancer ([place]) 1979   S/P hysterectomy  . Weakness    numbness and tingling in both feet r/t back    Past Surgical History:  Procedure Laterality Date  . ABDOMINAL EXPOSURE N/A 07/26/2017   Procedure: ABDOMINAL EXPOSURE;  Surgeon: Angelia Mould, MD;  Location: [facility];  Service: Vascular;  Laterality: N/A;  . ANTERIOR CERVICAL DECOMP/DISCECTOMY FUSION  2001  . ANTERIOR LUMBAR FUSION N/A 07/26/2017   Procedure: Lumbar five-Sacral one Anterior lumbar interbody fusion with Dr. Deitra Mayo for approach;  Surgeon: Ditty, Kevan Ny, MD;  Location: [facility];  Service:  Neurosurgery;  Laterality: N/A;  . APPLICATION OF ROBOTIC ASSISTANCE FOR SPINAL PROCEDURE  12/19/2016   Procedure: APPLICATION OF ROBOTIC ASSISTANCE FOR SPINAL PROCEDURE;  Surgeon: Kevan Ny Ditty, MD;  Location: [facility];  Service: Neurosurgery;;  . APPLICATION OF ROBOTIC ASSISTANCE FOR SPINAL PROCEDURE N/A 07/26/2017   Procedure: APPLICATION OF ROBOTIC ASSISTANCE FOR SPINAL PROCEDURE;  Surgeon: Ditty, Kevan Ny, MD;  Location: [facility];  Service: Neurosurgery;  Laterality: N/A;  . APPLICATION OF ROBOTIC ASSISTANCE FOR SPINAL PROCEDURE N/A 09/02/2017   Procedure: APPLICATION OF ROBOTIC ASSISTANCE FOR SPINAL PROCEDURE;  Surgeon: Ditty, Kevan Ny, MD;  Location: [facility];  Service: Neurosurgery;  Laterality: N/A;  . BACK SURGERY    . BLADDER SUSPENSION  1991   tack  . INGUINAL HERNIA REPAIR Right 1991  . LUMBAR FUSION  2016; 2017; 07/26/2017   L4-5; L2-3; L5-S1  . POSTERIOR LUMBAR FUSION 4 WITH HARDWARE REMOVAL N/A 09/02/2017   Procedure: Lumbar three-four redo laminectomy; Repositioning of Left Sacral two screw; Extension of lumbar fusion to Thoracic twelve;  Surgeon: Ditty, Kevan Ny, MD;  Location: [facility];  Service: Neurosurgery;  Laterality: N/A;  . TONSILLECTOMY AND ADENOIDECTOMY  1959  . TUMOR EXCISION     WERTHIN'S TUMORS BOTH SIDES OF NECK  . VAGINAL HYSTERECTOMY  1979    There were no  vitals filed for this visit.  Subjective Assessment - 08/18/18 1533    Subjective  Still no brace, pt states she is supposed to get it next thursday. No falls to report, HEP is going well.     Patient is accompained by:  Family member    Pertinent History  HTN, DM2, left T10 costotransversectomy, T10-T11 laminectomy for decompression, pedicle screws, T9-T12 (4/18), L5-S1 anterior retroperitoneal exposure, L5-S1 laminectomy, L5-S1 posterior lateral fixation with fusion (11/18); chronic pain    Limitations  Standing;Walking    How long can you walk comfortably?  household distances, limited  community ambulation with RW vs rollator    Patient Stated Goals  improve balance and transition to use of cane    Currently in Pain?  No/denies        [facility] Adult PT Treatment/Exercise - 08/18/18 1604      Ambulation/Gait   Ambulation/Gait  Yes    Ambulation/Gait Assistance  4: Min guard;4: Min assist    Ambulation/Gait Assistance Details  One episode of LOB on first lap due to RLE toe drag required min assist, otherwise pt min guard with ambulation. VC's for posture, forward gaze. One seated rest break during initial lap.     Ambulation Distance (Feet)  115 Feet   x2   Assistive device  Straight cane;Other (Comment)   R Sweedish knee cage, rubber quad tipped cane   Gait Pattern  Step-to pattern;Step-through pattern;Decreased step length - left;Decreased stance time - right    Ambulation Surface  Level;Indoor      High Level Balance   High Level Balance Activities  Side stepping;Other (comment)   Static standing for 30secsx2 on blue foam beam.    High Level Balance Comments  In parallel bars on blue foam beam with intermittent SUE support required.       Neuro Re-ed    Neuro Re-ed Details   Pt performing figure 8 around hula hoops and complete turns around cones with rubber quad tipped cane/sweedish knee cage with min guard, No LOB noted, minimal instability, min difficulty with RLE control during turns.          PT Short Term Goals - 07/05/18 1110      PT SHORT TERM GOAL #1   Title  patient to be independent with initial HEP for strength, balance and gait. (All STGs due 07/04/18)    Baseline  07/05/18: pt is independent with current HEP    Time  --    Period  --    Status  Achieved      PT SHORT TERM GOAL #2   Title  Patient to demonstrate TUG with rollator to </= 16 seconds for reduced fall risk    Baseline  07/05/18: 18.19 sec's with rollator, improved just not to goal    Time  --    Period  --    Status  Partially Met      PT SHORT TERM GOAL #3   Title  Patient to  demonstrate 400' gait on level surfaces with rollator at Mod I without LOB or overt instability    Baseline  07/05/18: met today with rollator    Time  --    Period  --    Status  Achieved      PT SHORT TERM GOAL #4   Title  Patient to perform sit to stand x 10 with unilateral UE support with Mod I demonstrating improved strength and efficiency of transfers    Baseline  07/05/18:  performed 10 reps with single UE support on chair, needed rollator for stability upon standing, no physical assist needed.     Time  --    Period  --    Status  Achieved        PT Long Term Goals - 08/09/18 1531      PT LONG TERM GOAL #1   Title  Patient to be independent with final HEP for balance, strength, gait and other functional mobility. (All LTGs due 09/20/18)    Time  13    Period  Weeks    Status  On-going      PT LONG TERM GOAL #2   Title  patient to demonstrate gait with LRAD over various levels and surfaces for >/= 500' with MOD I without LOB or overt instability    Baseline  08/09/18 - supervision on compliant surfaces    Time  13    Period  Weeks    Status  On-going      PT LONG TERM GOAL #3   Title  Patient to improve TUG to </= 13 seconds with LRAD demonstrating reduced fall risk    Baseline  08/09/18: 18.06    Time  13    Period  Weeks    Status  On-going      PT LONG TERM GOAL #4   Title  Patient to improve gait velocity with LRAD to at least 38f/sec for decreased fall risk    Baseline  2.02 ft/sec - 08/09/18    Time  13    Period  Weeks    Status  Achieved      PT LONG TERM GOAL #5   Title  Paitent to improve R LE strength by >/= 1 manual muscle grade demonstrating improved strength and functional use    Baseline  05/23/18: R LE grossly 3-/5    Time  13    Period  Weeks    Status  Deferred            Plan - 08/18/18 1626    Clinical Impression Statement  Todays skilled session focused on gait training with quad tipped cane/sweedish knee cage with one episode of  LOB, high level balance on compliant/non compliant surfaces with min guard. Pt continued to state she continued to feel a "zing" and heaviness on LE after weightbearing exercises. Pt should benefit from continued PT sessions to progress towards goals.     Rehab Potential  Good    PT Frequency  2x / week    PT Treatment/Interventions  ADLs/Self Care Home Management;Cryotherapy;Electrical Stimulation;DME Instruction;Moist Heat;Gait training;Stair training;Functional mobility training;Therapeutic activities;Therapeutic exercise;Balance training;Patient/family education;Neuromuscular re-education;Manual techniques;Taping;Passive range of motion;Orthotic Fit/Training    PT Next Visit Plan  Gait training with LBQC/knee brace, continue to work on core strengthening/thoracic spine strengthening for improved posture, continue to work on high level balance activites/right LE strengthening;     PT Home Exercise Plan  [id number]    Consulted and Agree with Plan of Care  Patient       Patient will benefit from skilled therapeutic intervention in order to improve the following deficits and impairments:  Abnormal gait, Decreased activity tolerance, Decreased balance, Decreased safety awareness, Difficulty walking, Decreased strength, Decreased mobility, Decreased coordination, Pain, Impaired sensation  Visit Diagnosis: Muscle weakness (generalized)  Unsteadiness on feet  Other abnormalities of gait and mobility     Problem List Patient Active Problem List   Diagnosis Date Noted  . Arthralgia of left hand 07/17/2018  .  Debility   . Anemia of chronic disease   . Chronic pain syndrome   . Hypokalemia   . Acute lower UTI 09/04/2017  . L3 vertebral fracture ([place]) 09/01/2017  . Constipation due to pain medication   . Muscle spasms of both lower extremities   . Type 2 diabetes mellitus with peripheral neuropathy (HCC)   . Hypoalbuminemia due to protein-calorie malnutrition ([place])   . Radiculopathy  07/28/2017  . Lumbosacral spondylosis with radiculopathy 07/26/2017  . Paraplegia, incomplete ([place]) 01/07/2017  . 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/27/2016  . Myelopathy ([place]) 12/21/2016  . Neuropathic pain   . 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 Paraparesis ([place]) 12/15/2016  . Type 2 diabetes mellitus without complication, without long-term current use of insulin ([place]) 11/02/2016  . Hyperlipidemia 11/02/2016  . Essential hypertension 11/02/2016  . 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   Megan [name], PTA  Megan [name] 08/18/2018, 4:32 PM  [facility] [address] [place], [place], [address] Phone: [phone]   Fax:  [phone]  Name: Megan [name] MRN: [id number] Date of Birth: 11/03/1950

## 2018-08-22 ENCOUNTER — Encounter: Payer: Self-pay | Admitting: Physical Therapy

## 2018-08-22 ENCOUNTER — Ambulatory Visit: Payer: PPO | Admitting: Physical Therapy

## 2018-08-22 DIAGNOSIS — R2689 Other abnormalities of gait and mobility: Secondary | ICD-10-CM

## 2018-08-22 DIAGNOSIS — M6281 Muscle weakness (generalized): Secondary | ICD-10-CM

## 2018-08-22 DIAGNOSIS — R2681 Unsteadiness on feet: Secondary | ICD-10-CM

## 2018-08-22 NOTE — Therapy (Signed)
[facility] [address] [place], [place], [address] Phone: [phone]   Fax:  [phone]  Physical Therapy Treatment  Patient Details  Name: Megan [name] MRN: [id number] Date of Birth: [birth year] Referring Provider (PT): Dr. Vertell Limber   Encounter Date: 08/22/2018  PT End of Session - 08/22/18 1627    Visit Number  22    Number of Visits  27    Date for PT Re-Evaluation  09/20/18    Authorization Type  Medicare    PT Start Time  1450    PT Stop Time  1530    PT Time Calculation (min)  40 min    Equipment Utilized During Treatment  Gait belt    Activity Tolerance  Patient tolerated treatment well    Behavior During Therapy  [facility] for tasks assessed/performed       Past Medical History:  Diagnosis Date  . Arthritis    "hands, back" (07/26/2017)  . Dyspnea    W/ PHYS CONDITION   . GERD (gastroesophageal reflux disease)   . History of bronchitis    "not since I quit smoking" (07/26/2017)  . History of kidney stones   . History of shingles   . Hyperlipidemia    takes Fish Oil daily  . Hypertension   . Neuromuscular disorder (HCC)    tingling toes  . 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[place]) 12/15/2016  . Pneumonia 2009  . PONV (postoperative nausea and vomiting)   . Restless leg   . Type 2 diabetes mellitus ([place])   . Uterine cancer ([place]) 1979   S/P hysterectomy  . Weakness    numbness and tingling in both feet r/t back    Past Surgical History:  Procedure Laterality Date  . ABDOMINAL EXPOSURE N/A 07/26/2017   Procedure: ABDOMINAL EXPOSURE;  Surgeon: Angelia Mould, MD;  Location: [facility];  Service: Vascular;  Laterality: N/A;  . ANTERIOR CERVICAL DECOMP/DISCECTOMY FUSION  2001  . ANTERIOR LUMBAR FUSION N/A 07/26/2017   Procedure: Lumbar five-Sacral one Anterior lumbar interbody fusion with Dr. Deitra Mayo for approach;  Surgeon: Ditty, Kevan Ny, MD;  Location: [facility];  Service:  Neurosurgery;  Laterality: N/A;  . APPLICATION OF ROBOTIC ASSISTANCE FOR SPINAL PROCEDURE  12/19/2016   Procedure: APPLICATION OF ROBOTIC ASSISTANCE FOR SPINAL PROCEDURE;  Surgeon: Kevan Ny Ditty, MD;  Location: [facility];  Service: Neurosurgery;;  . APPLICATION OF ROBOTIC ASSISTANCE FOR SPINAL PROCEDURE N/A 07/26/2017   Procedure: APPLICATION OF ROBOTIC ASSISTANCE FOR SPINAL PROCEDURE;  Surgeon: Ditty, Kevan Ny, MD;  Location: [facility];  Service: Neurosurgery;  Laterality: N/A;  . APPLICATION OF ROBOTIC ASSISTANCE FOR SPINAL PROCEDURE N/A 09/02/2017   Procedure: APPLICATION OF ROBOTIC ASSISTANCE FOR SPINAL PROCEDURE;  Surgeon: Ditty, Kevan Ny, MD;  Location: [facility];  Service: Neurosurgery;  Laterality: N/A;  . BACK SURGERY    . BLADDER SUSPENSION  1991   tack  . INGUINAL HERNIA REPAIR Right 1991  . LUMBAR FUSION  2016; 2017; 07/26/2017   L4-5; L2-3; L5-S1  . POSTERIOR LUMBAR FUSION 4 WITH HARDWARE REMOVAL N/A 09/02/2017   Procedure: Lumbar three-four redo laminectomy; Repositioning of Left Sacral two screw; Extension of lumbar fusion to Thoracic twelve;  Surgeon: Ditty, Kevan Ny, MD;  Location: [facility];  Service: Neurosurgery;  Laterality: N/A;  . TONSILLECTOMY AND ADENOIDECTOMY  1959  . TUMOR EXCISION     WERTHIN'S TUMORS BOTH SIDES OF NECK  . VAGINAL HYSTERECTOMY  1979    There were no  vitals filed for this visit.  Subjective Assessment - 08/22/18 1615    Subjective  Still hopefull to get brace on Thursday.  Denies falls.    Pertinent History  HTN, DM2, left T10 costotransversectomy, T10-T11 laminectomy for decompression, pedicle screws, T9-T12 (4/18), L5-S1 anterior retroperitoneal exposure, L5-S1 laminectomy, L5-S1 posterior lateral fixation with fusion (11/18); chronic pain    Limitations  Standing;Walking    How long can you walk comfortably?  household distances, limited community ambulation with RW vs rollator    Patient Stated Goals  improve balance and transition to  use of cane    Currently in Pain?  No/denies                       [facility] Adult PT Treatment/Exercise - 08/22/18 0001      Transfers   Transfers  Sit to Stand;Stand to Sit    Sit to Stand  6: Modified independent (Device/Increase time)    Sit to Stand Details  Other (comment)   increased time   Stand to Sit  6: Modified independent (Device/Increase time)    Stand to Sit Details (indicate cue type and reason)  Other (comment)   increased time     Ambulation/Gait   Ambulation/Gait  Yes    Ambulation/Gait Assistance  4: Min guard;4: Min assist    Ambulation/Gait Assistance Details  swedish knee cage on R    Ambulation Distance (Feet)  150 Feet   100' x 2   Assistive device  Other (Comment);Straight cane;Rollator   straight cane with 4 pt tip   Gait Pattern  Step-to pattern;Step-through pattern;Decreased step length - left;Decreased stance time - right    Ambulation Surface  Level;Indoor    Gait Comments  Pt with decreased R foot clearance with straight cane vs Rollator;decreased control on R of swing phase with Rollator      High Level Balance   High Level Balance Activities  Negotiating over obstacles    High Level Balance Comments  in parallel bars stepping over foam boards with bil UE support;standing on blue foam beam with intermittent UE support and bil UE support with head turns/nods      Ankle Exercises: Seated   Heel Raises  Right;15 reps   cues to keep toes on floor   Toe Raise  15 reps;Other (comment)   cues to keep heel on floor;performed with red theraband   Other Seated Ankle Exercises  R ankle eversion without resistance with cues to isolate ankle and not use whole LE             PT Education - 08/22/18 1626    Education Details  continuing R ankle dorsiflexion with resistance and eversion without resistance;R heel strike with gait    Person(s) Educated  Patient    Methods  Explanation;Demonstration    Comprehension  Verbalized understanding        PT Short Term Goals - 07/05/18 1110      PT SHORT TERM GOAL #1   Title  patient to be independent with initial HEP for strength, balance and gait. (All STGs due 07/04/18)    Baseline  07/05/18: pt is independent with current HEP    Time  --    Period  --    Status  Achieved      PT SHORT TERM GOAL #2   Title  Patient to demonstrate TUG with rollator to </= 16 seconds for reduced fall risk    Baseline  07/05/18: 18.19 sec's with rollator, improved just not to goal    Time  --    Period  --    Status  Partially Met      PT SHORT TERM GOAL #3   Title  Patient to demonstrate 400' gait on level surfaces with rollator at Mod I without LOB or overt instability    Baseline  07/05/18: met today with rollator    Time  --    Period  --    Status  Achieved      PT SHORT TERM GOAL #4   Title  Patient to perform sit to stand x 10 with unilateral UE support with Mod I demonstrating improved strength and efficiency of transfers    Baseline  07/05/18: performed 10 reps with single UE support on chair, needed rollator for stability upon standing, no physical assist needed.     Time  --    Period  --    Status  Achieved        PT Long Term Goals - 08/09/18 1531      PT LONG TERM GOAL #1   Title  Patient to be independent with final HEP for balance, strength, gait and other functional mobility. (All LTGs due 09/20/18)    Time  13    Period  Weeks    Status  On-going      PT LONG TERM GOAL #2   Title  patient to demonstrate gait with LRAD over various levels and surfaces for >/= 500' with MOD I without LOB or overt instability    Baseline  08/09/18 - supervision on compliant surfaces    Time  13    Period  Weeks    Status  On-going      PT LONG TERM GOAL #3   Title  Patient to improve TUG to </= 13 seconds with LRAD demonstrating reduced fall risk    Baseline  08/09/18: 18.06    Time  13    Period  Weeks    Status  On-going      PT LONG TERM GOAL #4   Title  Patient to  improve gait velocity with LRAD to at least 52f/sec for decreased fall risk    Baseline  2.02 ft/sec - 08/09/18    Time  13    Period  Weeks    Status  Achieved      PT LONG TERM GOAL #5   Title  Paitent to improve R LE strength by >/= 1 manual muscle grade demonstrating improved strength and functional use    Baseline  05/23/18: R LE grossly 3-/5    Time  13    Period  Weeks    Status  Deferred            Plan - 08/22/18 1628    Clinical Impression Statement  Continued to work on gait with straight cane with quad tip, balance on compliant surface.  Reintroduced R dorsiflexion and eversion in seated and importance of heel strike on R with gait.  Less heel strike present with cane as pt does not feel as steady.  Continue PT per POC.    Rehab Potential  Good    PT Frequency  2x / week    PT Duration  Other (comment)    PT Treatment/Interventions  ADLs/Self Care Home Management;Cryotherapy;Electrical Stimulation;DME Instruction;Moist Heat;Gait training;Stair training;Functional mobility training;Therapeutic activities;Therapeutic exercise;Balance training;Patient/family education;Neuromuscular re-education;Manual techniques;Taping;Passive range of motion;Orthotic Fit/Training    PT Next Visit Plan  Gait training with LBQC/knee brace, continue to work on core strengthening/thoracic spine strengthening for improved posture, continue to work on high level balance activites/right LE strengthening;     [place] and Agree with Plan of Care  Patient       Patient will benefit from skilled therapeutic intervention in order to improve the following deficits and impairments:  Abnormal gait, Decreased activity tolerance, Decreased balance, Decreased safety awareness, Difficulty walking, Decreased strength, Decreased mobility, Decreased coordination, Pain, Impaired sensation  Visit Diagnosis: Muscle weakness (generalized)  Unsteadiness on feet  Other  abnormalities of gait and mobility     Problem List Patient Active Problem List   Diagnosis Date Noted  . Arthralgia of left hand 07/17/2018  . Debility   . Anemia of chronic disease   . Chronic pain syndrome   . Hypokalemia   . Acute lower UTI 09/04/2017  . L3 vertebral fracture ([place]) 09/01/2017  . Constipation due to pain medication   . Muscle spasms of both lower extremities   . Type 2 diabetes mellitus with peripheral neuropathy (HCC)   . Hypoalbuminemia due to protein-calorie malnutrition ([place])   . Radiculopathy 07/28/2017  . Lumbosacral spondylosis with radiculopathy 07/26/2017  . Paraplegia, incomplete ([place]) 01/07/2017  . 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/27/2016  . Myelopathy ([place]) 12/21/2016  . Neuropathic pain   . 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 Paraparesis ([place]) 12/15/2016  . Type 2 diabetes mellitus without complication, without long-term current use of insulin ([place]) 11/02/2016  . Hyperlipidemia 11/02/2016  . Essential hypertension 11/02/2016  . 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   Narda Bonds, PTA [facility] 08/22/18 4:33 PM Phone: [phone] Fax: [facility] [address] [place], [place], [address] Phone: [phone]   Fax:  [phone]  Name: Megan [name] MRN: [id number] Date of Birth: Feb 21, 1951

## 2018-08-24 DIAGNOSIS — M25361 Other instability, right knee: Secondary | ICD-10-CM | POA: Diagnosis not present

## 2018-08-25 ENCOUNTER — Ambulatory Visit: Payer: PPO

## 2018-08-25 DIAGNOSIS — R2689 Other abnormalities of gait and mobility: Secondary | ICD-10-CM

## 2018-08-25 DIAGNOSIS — R2681 Unsteadiness on feet: Secondary | ICD-10-CM

## 2018-08-25 DIAGNOSIS — M6281 Muscle weakness (generalized): Secondary | ICD-10-CM

## 2018-08-26 NOTE — Therapy (Signed)
[facility] [address] [place], [place], [address] Phone: [phone]   Fax:  [phone]  Physical Therapy Treatment  Patient Details  Name: Megan [name] MRN: [id number] Date of Birth: 11-15-1950 Referring Provider (PT): Dr. Vertell Limber   Encounter Date: 08/25/2018  PT End of Session - 08/25/18 1408    Visit Number  23    Number of Visits  27    Date for PT Re-Evaluation  09/20/18    Authorization Type  Medicare    PT Start Time  9242    PT Stop Time  1442    PT Time Calculation (min)  39 min    Equipment Utilized During Treatment  Gait belt    Activity Tolerance  Patient tolerated treatment well    Behavior During Therapy  [facility] for tasks assessed/performed       Past Medical History:  Diagnosis Date  . Arthritis    "hands, back" (07/26/2017)  . Dyspnea    W/ PHYS CONDITION   . GERD (gastroesophageal reflux disease)   . History of bronchitis    "not since I quit smoking" (07/26/2017)  . History of kidney stones   . History of shingles   . Hyperlipidemia    takes Fish Oil daily  . Hypertension   . Neuromuscular disorder (HCC)    tingling toes  . 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[place]) 12/15/2016  . Pneumonia 2009  . PONV (postoperative nausea and vomiting)   . Restless leg   . Type 2 diabetes mellitus ([place])   . Uterine cancer ([place]) 1979   S/P hysterectomy  . Weakness    numbness and tingling in both feet r/t back    Past Surgical History:  Procedure Laterality Date  . ABDOMINAL EXPOSURE N/A 07/26/2017   Procedure: ABDOMINAL EXPOSURE;  Surgeon: Angelia Mould, MD;  Location: [place];  Service: Vascular;  Laterality: N/A;  . ANTERIOR CERVICAL DECOMP/DISCECTOMY FUSION  2001  . ANTERIOR LUMBAR FUSION N/A 07/26/2017   Procedure: Lumbar five-Sacral one Anterior lumbar interbody fusion with Dr. Deitra Mayo for approach;  Surgeon: Ditty, Kevan Ny, MD;  Location: [place];  Service:  Neurosurgery;  Laterality: N/A;  . APPLICATION OF ROBOTIC ASSISTANCE FOR SPINAL PROCEDURE  12/19/2016   Procedure: APPLICATION OF ROBOTIC ASSISTANCE FOR SPINAL PROCEDURE;  Surgeon: Kevan Ny Ditty, MD;  Location: [place];  Service: Neurosurgery;;  . APPLICATION OF ROBOTIC ASSISTANCE FOR SPINAL PROCEDURE N/A 07/26/2017   Procedure: APPLICATION OF ROBOTIC ASSISTANCE FOR SPINAL PROCEDURE;  Surgeon: Ditty, Kevan Ny, MD;  Location: [place];  Service: Neurosurgery;  Laterality: N/A;  . APPLICATION OF ROBOTIC ASSISTANCE FOR SPINAL PROCEDURE N/A 09/02/2017   Procedure: APPLICATION OF ROBOTIC ASSISTANCE FOR SPINAL PROCEDURE;  Surgeon: Ditty, Kevan Ny, MD;  Location: [place];  Service: Neurosurgery;  Laterality: N/A;  . BACK SURGERY    . BLADDER SUSPENSION  1991   tack  . INGUINAL HERNIA REPAIR Right 1991  . LUMBAR FUSION  2016; 2017; 07/26/2017   L4-5; L2-3; L5-S1  . POSTERIOR LUMBAR FUSION 4 WITH HARDWARE REMOVAL N/A 09/02/2017   Procedure: Lumbar three-four redo laminectomy; Repositioning of Left Sacral two screw; Extension of lumbar fusion to Thoracic twelve;  Surgeon: Ditty, Kevan Ny, MD;  Location: [place];  Service: Neurosurgery;  Laterality: N/A;  . TONSILLECTOMY AND ADENOIDECTOMY  1959  . TUMOR EXCISION     WERTHIN'S TUMORS BOTH SIDES OF NECK  . VAGINAL HYSTERECTOMY  1979    There were no  vitals filed for this visit.  Subjective Assessment - 08/25/18 1407    Subjective  Pt recieved brace yesterday, was advised to wear 1hr/day, wore to session today. Pt reports no falls, HEP is going well.     Patient is accompained by:  Family member    Pertinent History  HTN, DM2, left T10 costotransversectomy, T10-T11 laminectomy for decompression, pedicle screws, T9-T12 (4/18), L5-S1 anterior retroperitoneal exposure, L5-S1 laminectomy, L5-S1 posterior lateral fixation with fusion (11/18); chronic pain    Limitations  Standing;Walking    How long can you walk comfortably?  household  distances, limited community ambulation with RW vs rollator    Patient Stated Goals  improve balance and transition to use of cane    Currently in Pain?  No/denies          [facility] Adult PT Treatment/Exercise - 08/26/18 0001      Transfers   Transfers  Sit to Stand;Stand to Sit    Sit to Stand  5: Supervision;4: Min guard    Sit to Stand Details (indicate cue type and reason)  Pt supervsion with BUE use, pt was unable to stand with no UE use, min guard with SUE support while transfersing into standing. VC's for proper foot placement and technique.     Stand to Sit  5: Supervision    Stand to Sit Details  Pt able to performing transfer with SUE use and supervision level assistance for safety. VC's for controlled descent.     Number of Reps  10 reps;2 sets      Ambulation/Gait   Ambulation/Gait  Yes    Ambulation/Gait Assistance  4: Min guard    Ambulation/Gait Assistance Details  VC's for forward gaze, pt continued to talk about "zings" that she felt in her RLE due to return of sensation, not painful just uncomfortable.     Ambulation Distance (Feet)  115 Feet   x2   Assistive device  Straight cane;Other (Comment)   with rubber quad tip, sweedish knee cage   Gait Pattern  Step-to pattern;Step-through pattern;Decreased step length - left;Decreased stance time - right    Ambulation Surface  Level;Indoor      Knee/Hip Exercises: Supine   Other Supine Knee/Hip Exercises  Pt on mat in supine with wedge/pillow under back performing abductor/adductor stretch with manual pressure for further stretch, trunk rotation, knee to chest with AAROM on RLE, bridges with knees bent and straight leg bridge, SLR's with pt requiring rest break between each task. Pt C/O of some mid back pain during trunk rotation to L side.        PT Short Term Goals - 07/05/18 1110      PT SHORT TERM GOAL #1   Title  patient to be independent with initial HEP for strength, balance and gait. (All STGs due 07/04/18)     Baseline  07/05/18: pt is independent with current HEP    Time  --    Period  --    Status  Achieved      PT SHORT TERM GOAL #2   Title  Patient to demonstrate TUG with rollator to </= 16 seconds for reduced fall risk    Baseline  07/05/18: 18.19 sec's with rollator, improved just not to goal    Time  --    Period  --    Status  Partially Met      PT SHORT TERM GOAL #3   Title  Patient to demonstrate 400' gait on level surfaces with  rollator at Mod I without LOB or overt instability    Baseline  07/05/18: met today with rollator    Time  --    Period  --    Status  Achieved      PT SHORT TERM GOAL #4   Title  Patient to perform sit to stand x 10 with unilateral UE support with Mod I demonstrating improved strength and efficiency of transfers    Baseline  07/05/18: performed 10 reps with single UE support on chair, needed rollator for stability upon standing, no physical assist needed.     Time  --    Period  --    Status  Achieved        PT Long Term Goals - 08/09/18 1531      PT LONG TERM GOAL #1   Title  Patient to be independent with final HEP for balance, strength, gait and other functional mobility. (All LTGs due 09/20/18)    Time  13    Period  Weeks    Status  On-going      PT LONG TERM GOAL #2   Title  patient to demonstrate gait with LRAD over various levels and surfaces for >/= 500' with MOD I without LOB or overt instability    Baseline  08/09/18 - supervision on compliant surfaces    Time  13    Period  Weeks    Status  On-going      PT LONG TERM GOAL #3   Title  Patient to improve TUG to </= 13 seconds with LRAD demonstrating reduced fall risk    Baseline  08/09/18: 18.06    Time  13    Period  Weeks    Status  On-going      PT LONG TERM GOAL #4   Title  Patient to improve gait velocity with LRAD to at least 23f/sec for decreased fall risk    Baseline  2.02 ft/sec - 08/09/18    Time  13    Period  Weeks    Status  Achieved      PT LONG TERM GOAL #5    Title  Paitent to improve R LE strength by >/= 1 manual muscle grade demonstrating improved strength and functional use    Baseline  05/23/18: R LE grossly 3-/5    Time  13    Period  Weeks    Status  Deferred            Plan - 08/26/18 1040    Clinical Impression Statement  Todays skilled session focused on gait training with pt's new sweedish knee cage brace/rubber quad tipped cane and exercises/stretches in supine with wedge under back to decrease pain. Pt continues to regain sensation in RLE stating that she feels "zings" anytime she bears weight on RLE. Pt should benefit from continued PT sessions to progress towards goals.     Rehab Potential  Good    PT Frequency  2x / week    PT Duration  Other (comment)    PT Treatment/Interventions  ADLs/Self Care Home Management;Cryotherapy;Electrical Stimulation;DME Instruction;Moist Heat;Gait training;Stair training;Functional mobility training;Therapeutic activities;Therapeutic exercise;Balance training;Patient/family education;Neuromuscular re-education;Manual techniques;Taping;Passive range of motion;Orthotic Fit/Training    PT Next Visit Plan  Gait training with quad tipped cane/knee brace, continue to work on core strengthening/thoracic spine strengthening for improved posture, continue to work on high level balance activites/right LE strengthening;     PT Home Exercise Plan  [id number]    Consulted and Agree  with Plan of Care  Patient       Patient will benefit from skilled therapeutic intervention in order to improve the following deficits and impairments:  Abnormal gait, Decreased activity tolerance, Decreased balance, Decreased safety awareness, Difficulty walking, Decreased strength, Decreased mobility, Decreased coordination, Pain, Impaired sensation  Visit Diagnosis: Muscle weakness (generalized)  Unsteadiness on feet  Other abnormalities of gait and mobility     Problem List Patient Active Problem List   Diagnosis Date  Noted  . Arthralgia of left hand 07/17/2018  . Debility   . Anemia of chronic disease   . Chronic pain syndrome   . Hypokalemia   . Acute lower UTI 09/04/2017  . L3 vertebral fracture ([place]) 09/01/2017  . Constipation due to pain medication   . Muscle spasms of both lower extremities   . Type 2 diabetes mellitus with peripheral neuropathy (HCC)   . Hypoalbuminemia due to protein-calorie malnutrition ([place])   . Radiculopathy 07/28/2017  . Lumbosacral spondylosis with radiculopathy 07/26/2017  . Paraplegia, incomplete ([place]) 01/07/2017  . 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/27/2016  . Myelopathy ([place]) 12/21/2016  . Neuropathic pain   . 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 Paraparesis ([place]) 12/15/2016  . Type 2 diabetes mellitus without complication, without long-term current use of insulin ([place]) 11/02/2016  . Hyperlipidemia 11/02/2016  . Essential hypertension 11/02/2016  . 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   Aarsh Fristoe, PTA  Kimmy Totten A Lloyd Cullinan 08/26/2018, 10:47 AM  [facility] [address] [place], [place], [address] Phone: [phone]   Fax:  [phone]  Name: Megan [name] MRN: [id number] Date of Birth: 27-Oct-1950

## 2018-08-29 ENCOUNTER — Encounter: Payer: Self-pay | Admitting: Physical Therapy

## 2018-08-29 ENCOUNTER — Ambulatory Visit: Payer: PPO | Admitting: Physical Therapy

## 2018-08-29 DIAGNOSIS — R2681 Unsteadiness on feet: Secondary | ICD-10-CM

## 2018-08-29 DIAGNOSIS — R2689 Other abnormalities of gait and mobility: Secondary | ICD-10-CM

## 2018-08-29 DIAGNOSIS — M6281 Muscle weakness (generalized): Secondary | ICD-10-CM

## 2018-08-29 NOTE — Therapy (Signed)
[facility] [address] [place], [place], [address] Phone: [phone]   Fax:  [phone]  Physical Therapy Treatment  Patient Details  Name: Megan [name] MRN: [id number] Date of Birth: 25-Jan-1951 Referring Provider (PT): Dr. Macarthur Critchley Date: 08/29/2018  PT End of Session - 08/29/18 1323    Visit Number  24    Number of Visits  27    Date for PT Re-Evaluation  09/20/18    Authorization Type  Medicare    PT Start Time  1318    PT Stop Time  1402    PT Time Calculation (min)  44 min    Equipment Utilized During Treatment  Gait belt    Activity Tolerance  Patient tolerated treatment well;No increased pain    Behavior During Therapy  WFL for tasks assessed/performed       Past Medical History:  Diagnosis Date  . Arthritis    "hands, back" (07/26/2017)  . Dyspnea    W/ PHYS CONDITION   . GERD (gastroesophageal reflux disease)   . History of bronchitis    "not since I quit smoking" (07/26/2017)  . History of kidney stones   . History of shingles   . Hyperlipidemia    takes Fish Oil daily  . Hypertension   . Neuromuscular disorder (HCC)    tingling toes  . 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[place]) 12/15/2016  . Pneumonia 2009  . PONV (postoperative nausea and vomiting)   . Restless leg   . Type 2 diabetes mellitus ([place])   . Uterine cancer ([place]) 1979   S/P hysterectomy  . Weakness    numbness and tingling in both feet r/t back    Past Surgical History:  Procedure Laterality Date  . ABDOMINAL EXPOSURE N/A 07/26/2017   Procedure: ABDOMINAL EXPOSURE;  Surgeon: Angelia Mould, MD;  Location: [place];  Service: Vascular;  Laterality: N/A;  . ANTERIOR CERVICAL DECOMP/DISCECTOMY FUSION  2001  . ANTERIOR LUMBAR FUSION N/A 07/26/2017   Procedure: Lumbar five-Sacral one Anterior lumbar interbody fusion with Dr. Deitra Mayo for approach;  Surgeon: Ditty, Kevan Ny, MD;  Location: [facility];   Service: Neurosurgery;  Laterality: N/A;  . APPLICATION OF ROBOTIC ASSISTANCE FOR SPINAL PROCEDURE  12/19/2016   Procedure: APPLICATION OF ROBOTIC ASSISTANCE FOR SPINAL PROCEDURE;  Surgeon: Kevan Ny Ditty, MD;  Location: [place] [facility];  Service: Neurosurgery;;  . APPLICATION OF ROBOTIC ASSISTANCE FOR SPINAL PROCEDURE N/A 07/26/2017   Procedure: APPLICATION OF ROBOTIC ASSISTANCE FOR SPINAL PROCEDURE;  Surgeon: Ditty, Kevan Ny, MD;  Location: [place];  Service: Neurosurgery;  Laterality: N/A;  . APPLICATION OF ROBOTIC ASSISTANCE FOR SPINAL PROCEDURE N/A 09/02/2017   Procedure: APPLICATION OF ROBOTIC ASSISTANCE FOR SPINAL PROCEDURE;  Surgeon: Ditty, Kevan Ny, MD;  Location: [place];  Service: Neurosurgery;  Laterality: N/A;  . BACK SURGERY    . BLADDER SUSPENSION  1991   tack  . INGUINAL HERNIA REPAIR Right 1991  . LUMBAR FUSION  2016; 2017; 07/26/2017   L4-5; L2-3; L5-S1  . POSTERIOR LUMBAR FUSION 4 WITH HARDWARE REMOVAL N/A 09/02/2017   Procedure: Lumbar three-four redo laminectomy; Repositioning of Left Sacral two screw; Extension of lumbar fusion to Thoracic twelve;  Surgeon: Ditty, Kevan Ny, MD;  Location: [facility];  Service: Neurosurgery;  Laterality: N/A;  . TONSILLECTOMY AND ADENOIDECTOMY  1959  . TUMOR EXCISION     WERTHIN'S TUMORS BOTH SIDES OF NECK  . VAGINAL HYSTERECTOMY  1979    There  were no vitals filed for this visit.  Subjective Assessment - 08/29/18 1318    Subjective  Still wearing the brace for 1 hour a day only. Advised pt she can increase the wear as she has no skin issues. Pt is to increase to 2 hours 2 times a day.     Patient is accompained by:  --   friend, Megan [name]   Pertinent History  HTN, DM2, left T10 costotransversectomy, T10-T11 laminectomy for decompression, pedicle screws, T9-T12 (4/18), L5-S1 anterior retroperitoneal exposure, L5-S1 laminectomy, L5-S1 posterior lateral fixation with fusion (11/18); chronic pain    How long can you walk comfortably?   household distances, limited community ambulation with RW vs rollator    Patient Stated Goals  improve balance and transition to use of cane    Currently in Pain?  Yes    Pain Score  8     Pain Location  Leg    Pain Orientation  Right    Pain Descriptors / Indicators  Shooting;Sharp    Pain Type  Neuropathic pain    Pain Onset  More than a month ago    Pain Frequency  Constant   varies in intensity   Aggravating Factors   standing mostly, sometimes occurs with sitting    Pain Relieving Factors  lying down          [facility] Adult PT Treatment/Exercise - 08/29/18 1328      Transfers   Transfers  Sit to Stand;Stand to Sit    Sit to Stand  5: Supervision;With upper extremity assist;From bed;From chair/3-in-1    Stand to Sit  5: Supervision;With upper extremity assist;To bed;To chair/3-in-1      Ambulation/Gait   Ambulation/Gait  Yes    Ambulation/Gait Assistance  4: Min guard;5: Supervision    Ambulation/Gait Assistance Details  cues for posture. progressed from step to pattern to short step through pattern.  Had pt really focus on sensation in her right leg with gait with pt noting the "zinging" is worse with swing phase of gait and drops off in stance phase of gait. reports no feelings of buckling with right leg or other feelings of instability with gait. used small based quad cane at pt's request today as she has both styles of canes at home. Pt reports feeling more secure with use of small based quad cane today.     Ambulation Distance (Feet)  115 Feet   x2 reps   Assistive device  Other (Comment);Left platform walker;Small based quad cane   swedish knee cage   Gait Pattern  Step-to pattern;Step-through pattern;Decreased step length - left;Decreased stance time - right    Ambulation Surface  Level;Indoor      Exercises   Other Exercises   seated on green air disc with feet on floor: with dowel with 2# weight on it- UE raises with emphasis on tall posture/abd bracing for 10 reps;  with red theraband: rows, shoulder extension and then shoulder horizontal abduction x 10 reps each with emphasis on tall posture/abd bracing with ex's.            PT Short Term Goals - 07/05/18 1110      PT SHORT TERM GOAL #1   Title  patient to be independent with initial HEP for strength, balance and gait. (All STGs due 07/04/18)    Baseline  07/05/18: pt is independent with current HEP    Time  --    Period  --    Status  Achieved  PT SHORT TERM GOAL #2   Title  Patient to demonstrate TUG with rollator to </= 16 seconds for reduced fall risk    Baseline  07/05/18: 18.19 sec's with rollator, improved just not to goal    Time  --    Period  --    Status  Partially Met      PT SHORT TERM GOAL #3   Title  Patient to demonstrate 400' gait on level surfaces with rollator at Mod I without LOB or overt instability    Baseline  07/05/18: met today with rollator    Time  --    Period  --    Status  Achieved      PT SHORT TERM GOAL #4   Title  Patient to perform sit to stand x 10 with unilateral UE support with Mod I demonstrating improved strength and efficiency of transfers    Baseline  07/05/18: performed 10 reps with single UE support on chair, needed rollator for stability upon standing, no physical assist needed.     Time  --    Period  --    Status  Achieved        PT Long Term Goals - 08/09/18 1531      PT LONG TERM GOAL #1   Title  Patient to be independent with final HEP for balance, strength, gait and other functional mobility. (All LTGs due 09/20/18)    Time  13    Period  Weeks    Status  On-going      PT LONG TERM GOAL #2   Title  patient to demonstrate gait with LRAD over various levels and surfaces for >/= 500' with MOD I without LOB or overt instability    Baseline  08/09/18 - supervision on compliant surfaces    Time  13    Period  Weeks    Status  On-going      PT LONG TERM GOAL #3   Title  Patient to improve TUG to </= 13 seconds with LRAD  demonstrating reduced fall risk    Baseline  08/09/18: 18.06    Time  13    Period  Weeks    Status  On-going      PT LONG TERM GOAL #4   Title  Patient to improve gait velocity with LRAD to at least 70f/sec for decreased fall risk    Baseline  2.02 ft/sec - 08/09/18    Time  13    Period  Weeks    Status  Achieved      PT LONG TERM GOAL #5   Title  Paitent to improve R LE strength by >/= 1 manual muscle grade demonstrating improved strength and functional use    Baseline  05/23/18: R LE grossly 3-/5    Time  13    Period  Weeks    Status  Deferred            Plan - 08/29/18 1323    Clinical Impression Statement  Today's skilled session continued to address gait using small based quad cane at pt's request to try it. Pt reported feeling more stable with use of this style cane vs straight cane with rubber tip at this time (she has both at home). Also worked on core strengthening without any issues reported. Advised pt if the "zinging" continues to make her neurosurgeon aware so that he can check her out if needed. The pt verbalized understanding of this. She also reports  increased feeling in certain areas of her right leg that used to be completely numb.  The pt is progressing toward goals and should benefit from continued PT to progress toward unmet goals.     Rehab Potential  Good    PT Frequency  2x / week    PT Duration  Other (comment)    PT Treatment/Interventions  ADLs/Self Care Home Management;Cryotherapy;Electrical Stimulation;DME Instruction;Moist Heat;Gait training;Stair training;Functional mobility training;Therapeutic activities;Therapeutic exercise;Balance training;Patient/family education;Neuromuscular re-education;Manual techniques;Taping;Passive range of motion;Orthotic Fit/Training    PT Next Visit Plan  Gait training with small based quad cane/knee brace, continue to work on core strengthening/thoracic spine strengthening for improved posture, continue to work on high  level balance activites/right LE strengthening;     [place] and Agree with Plan of Care  Patient       Patient will benefit from skilled therapeutic intervention in order to improve the following deficits and impairments:  Abnormal gait, Decreased activity tolerance, Decreased balance, Decreased safety awareness, Difficulty walking, Decreased strength, Decreased mobility, Decreased coordination, Pain, Impaired sensation  Visit Diagnosis: Muscle weakness (generalized)  Unsteadiness on feet  Other abnormalities of gait and mobility     Problem List Patient Active Problem List   Diagnosis Date Noted  . Arthralgia of left hand 07/17/2018  . Debility   . Anemia of chronic disease   . Chronic pain syndrome   . Hypokalemia   . Acute lower UTI 09/04/2017  . L3 vertebral fracture ([place]) 09/01/2017  . Constipation due to pain medication   . Muscle spasms of both lower extremities   . Type 2 diabetes mellitus with peripheral neuropathy (HCC)   . Hypoalbuminemia due to protein-calorie malnutrition ([place])   . Radiculopathy 07/28/2017  . Lumbosacral spondylosis with radiculopathy 07/26/2017  . Paraplegia, incomplete ([place]) 01/07/2017  . 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/27/2016  . Myelopathy ([place]) 12/21/2016  . Neuropathic pain   . 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 Paraparesis ([place]) 12/15/2016  . Type 2 diabetes mellitus without complication, without long-term current use of insulin ([place]) 11/02/2016  . Hyperlipidemia 11/02/2016  . Essential hypertension 11/02/2016  . 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    [name], PTA, [facility] [address], [place], [place] [address] [phone] 08/29/18, 3:27 PM   Name: Megan [name] MRN: [id number] Date of Birth: [birth year]

## 2018-08-31 ENCOUNTER — Other Ambulatory Visit: Payer: Self-pay | Admitting: Primary Care

## 2018-08-31 DIAGNOSIS — G894 Chronic pain syndrome: Secondary | ICD-10-CM

## 2018-08-31 DIAGNOSIS — M4727 Other spondylosis with radiculopathy, lumbosacral region: Secondary | ICD-10-CM

## 2018-08-31 NOTE — Telephone Encounter (Signed)
Last prescribed on 03/08/2018  Last office visit on 07/17/2018

## 2018-09-01 ENCOUNTER — Ambulatory Visit: Payer: PPO

## 2018-09-01 DIAGNOSIS — M6281 Muscle weakness (generalized): Secondary | ICD-10-CM

## 2018-09-01 DIAGNOSIS — R2689 Other abnormalities of gait and mobility: Secondary | ICD-10-CM

## 2018-09-01 DIAGNOSIS — R2681 Unsteadiness on feet: Secondary | ICD-10-CM

## 2018-09-01 NOTE — Telephone Encounter (Signed)
Noted.  Refill sent to pharmacy. 

## 2018-09-02 NOTE — Therapy (Signed)
[facility] [address] [place], [place], [address] Phone: [phone]   Fax:  [phone]  Physical Therapy Treatment  Patient Details  Name: Megan [name] MRN: [id number] Date of Birth: [birth year] Referring Provider (PT): Dr. Macarthur Critchley Date: 09/01/2018  PT End of Session - 09/01/18 1415    Visit Number  25    Number of Visits  27    Date for PT Re-Evaluation  09/20/18    Authorization Type  Medicare    PT Start Time  8250    PT Stop Time  1445    PT Time Calculation (min)  40 min    Equipment Utilized During Treatment  Gait belt    Activity Tolerance  Patient tolerated treatment well;No increased pain    Behavior During Therapy  WFL for tasks assessed/performed       Past Medical History:  Diagnosis Date  . Arthritis    "hands, back" (07/26/2017)  . Dyspnea    W/ PHYS CONDITION   . GERD (gastroesophageal reflux disease)   . History of bronchitis    "not since I quit smoking" (07/26/2017)  . History of kidney stones   . History of shingles   . Hyperlipidemia    takes Fish Oil daily  . Hypertension   . Neuromuscular disorder (HCC)    tingling toes  . 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[place]) 12/15/2016  . Pneumonia 2009  . PONV (postoperative nausea and vomiting)   . Restless leg   . Type 2 diabetes mellitus ([place])   . Uterine cancer ([facility]) 1979   S/P hysterectomy  . Weakness    numbness and tingling in both feet r/t back    Past Surgical History:  Procedure Laterality Date  . ABDOMINAL EXPOSURE N/A 07/26/2017   Procedure: ABDOMINAL EXPOSURE;  Surgeon: Angelia Mould, MD;  Location: [facility];  Service: Vascular;  Laterality: N/A;  . ANTERIOR CERVICAL DECOMP/DISCECTOMY FUSION  2001  . ANTERIOR LUMBAR FUSION N/A 07/26/2017   Procedure: Lumbar five-Sacral one Anterior lumbar interbody fusion with Dr. Deitra Mayo for approach;  Surgeon: Ditty, Kevan Ny, MD;  Location: [facility];   Service: Neurosurgery;  Laterality: N/A;  . APPLICATION OF ROBOTIC ASSISTANCE FOR SPINAL PROCEDURE  12/19/2016   Procedure: APPLICATION OF ROBOTIC ASSISTANCE FOR SPINAL PROCEDURE;  Surgeon: Kevan Ny Ditty, MD;  Location: [facility];  Service: Neurosurgery;;  . APPLICATION OF ROBOTIC ASSISTANCE FOR SPINAL PROCEDURE N/A 07/26/2017   Procedure: APPLICATION OF ROBOTIC ASSISTANCE FOR SPINAL PROCEDURE;  Surgeon: Ditty, Kevan Ny, MD;  Location: [facility];  Service: Neurosurgery;  Laterality: N/A;  . APPLICATION OF ROBOTIC ASSISTANCE FOR SPINAL PROCEDURE N/A 09/02/2017   Procedure: APPLICATION OF ROBOTIC ASSISTANCE FOR SPINAL PROCEDURE;  Surgeon: Ditty, Kevan Ny, MD;  Location: [facility];  Service: Neurosurgery;  Laterality: N/A;  . BACK SURGERY    . BLADDER SUSPENSION  1991   tack  . INGUINAL HERNIA REPAIR Right 1991  . LUMBAR FUSION  2016; 2017; 07/26/2017   L4-5; L2-3; L5-S1  . POSTERIOR LUMBAR FUSION 4 WITH HARDWARE REMOVAL N/A 09/02/2017   Procedure: Lumbar three-four redo laminectomy; Repositioning of Left Sacral two screw; Extension of lumbar fusion to Thoracic twelve;  Surgeon: Ditty, Kevan Ny, MD;  Location: [facility];  Service: Neurosurgery;  Laterality: N/A;  . TONSILLECTOMY AND ADENOIDECTOMY  1959  . TUMOR EXCISION     WERTHIN'S TUMORS BOTH SIDES OF NECK  . VAGINAL HYSTERECTOMY  1979    There  were no vitals filed for this visit.  Subjective Assessment - 09/01/18 1413    Subjective  No falls to report, HEP is going well. Pt states that after wearing 2hrs/2x day she experienced increased spasms, wants to try again to see if thats what caused it and possibly lower to 1hr/2x day.     Patient is accompained by:  Family member   daughter   Pertinent History  HTN, DM2, left T10 costotransversectomy, T10-T11 laminectomy for decompression, pedicle screws, T9-T12 (4/18), L5-S1 anterior retroperitoneal exposure, L5-S1 laminectomy, L5-S1 posterior lateral fixation with fusion (11/18); chronic  pain    Limitations  Standing;Walking    How long can you walk comfortably?  household distances, limited community ambulation with RW vs rollator    Patient Stated Goals  improve balance and transition to use of cane    Currently in Pain?  No/denies        [facility] Adult PT Treatment/Exercise - 09/02/18 0001      Ambulation/Gait   Ambulation/Gait  Yes    Ambulation/Gait Assistance  4: Min guard;5: Supervision    Ambulation/Gait Assistance Details  VC's for posture, decrease UE assist, and step length.     Ambulation Distance (Feet)  175 Feet   100   Assistive device  Small based quad cane;Other (Comment)   sweedish knee brace RLE   Gait Pattern  Step-to pattern;Step-through pattern;Decreased step length - left;Decreased stance time - right      Knee/Hip Exercises: Aerobic   Nustep  L3 23mns, BLE's only               PT Short Term Goals - 07/05/18 1110      PT SHORT TERM GOAL #1   Title  patient to be independent with initial HEP for strength, balance and gait. (All STGs due 07/04/18)    Baseline  07/05/18: pt is independent with current HEP    Time  --    Period  --    Status  Achieved      PT SHORT TERM GOAL #2   Title  Patient to demonstrate TUG with rollator to </= 16 seconds for reduced fall risk    Baseline  07/05/18: 18.19 sec's with rollator, improved just not to goal    Time  --    Period  --    Status  Partially Met      PT SHORT TERM GOAL #3   Title  Patient to demonstrate 400' gait on level surfaces with rollator at Mod I without LOB or overt instability    Baseline  07/05/18: met today with rollator    Time  --    Period  --    Status  Achieved      PT SHORT TERM GOAL #4   Title  Patient to perform sit to stand x 10 with unilateral UE support with Mod I demonstrating improved strength and efficiency of transfers    Baseline  07/05/18: performed 10 reps with single UE support on chair, needed rollator for stability upon standing, no physical assist  needed.     Time  --    Period  --    Status  Achieved        PT Long Term Goals - 08/09/18 1531      PT LONG TERM GOAL #1   Title  Patient to be independent with final HEP for balance, strength, gait and other functional mobility. (All LTGs due 09/20/18)    Time  13  Period  Weeks    Status  On-going      PT LONG TERM GOAL #2   Title  patient to demonstrate gait with LRAD over various levels and surfaces for >/= 500' with MOD I without LOB or overt instability    Baseline  08/09/18 - supervision on compliant surfaces    Time  13    Period  Weeks    Status  On-going      PT LONG TERM GOAL #3   Title  Patient to improve TUG to </= 13 seconds with LRAD demonstrating reduced fall risk    Baseline  08/09/18: 18.06    Time  13    Period  Weeks    Status  On-going      PT LONG TERM GOAL #4   Title  Patient to improve gait velocity with LRAD to at least 63f/sec for decreased fall risk    Baseline  2.02 ft/sec - 08/09/18    Time  13    Period  Weeks    Status  Achieved      PT LONG TERM GOAL #5   Title  Paitent to improve R LE strength by >/= 1 manual muscle grade demonstrating improved strength and functional use    Baseline  05/23/18: R LE grossly 3-/5    Time  13    Period  Weeks    Status  Deferred            Plan - 09/02/18 1037    Clinical Impression Statement  Todays skilled session focused on gait training with SBQC/sweedish knee brace with a decrease in C/O "zingers", and BLE strengthening/endurance on Nustep. Pt is progressing towards goals and should benefit from PT sessions to continue to progress towards unmet goals.     Rehab Potential  Good    PT Frequency  2x / week    PT Duration  Other (comment)    PT Treatment/Interventions  ADLs/Self Care Home Management;Cryotherapy;Electrical Stimulation;DME Instruction;Moist Heat;Gait training;Stair training;Functional mobility training;Therapeutic activities;Therapeutic exercise;Balance training;Patient/family  education;Neuromuscular re-education;Manual techniques;Taping;Passive range of motion;Orthotic Fit/Training    PT Next Visit Plan  Gait training with small based quad cane/knee brace, continue to work on core strengthening/thoracic spine strengthening for improved posture, continue to work on high level balance activites/right LE strengthening;     [place] Agree with Plan of Care  Patient       Patient will benefit from skilled therapeutic intervention in order to improve the following deficits and impairments:  Abnormal gait, Decreased activity tolerance, Decreased balance, Decreased safety awareness, Difficulty walking, Decreased strength, Decreased mobility, Decreased coordination, Pain, Impaired sensation  Visit Diagnosis: Muscle weakness (generalized)  Unsteadiness on feet  Other abnormalities of gait and mobility     Problem List Patient Active Problem List   Diagnosis Date Noted  . Arthralgia of left hand 07/17/2018  . Debility   . Anemia of chronic disease   . Chronic pain syndrome   . Hypokalemia   . Acute lower UTI 09/04/2017  . L3 vertebral fracture ([place] 09/01/2017  . Constipation due to pain medication   . Muscle spasms of both lower extremities   . Type 2 diabetes mellitus with peripheral neuropathy (HCC)   . Hypoalbuminemia due to protein-calorie malnutrition ([place]   . Radiculopathy 07/28/2017  . Lumbosacral spondylosis with radiculopathy 07/26/2017  . Paraplegia, incomplete ([place] 01/07/2017  . 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/27/2016  . Myelopathy ([place] 12/21/2016  .  Neuropathic pain   . 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 Paraparesis ([place]) 12/15/2016  . Type 2 diabetes mellitus without complication, without long-term current use of insulin ([place]) 11/02/2016  . Hyperlipidemia 11/02/2016  . Essential hypertension 11/02/2016  . 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 Spondylolisthesis  of lumbar region 06/10/2015   Megan [name], PTA  Megan [name] 09/02/2018, 10:45 AM  [facility] [address] [place], [place], [address] Phone: [phone]   Fax:  [phone]  Name: Megan [name] MRN: [id number] Date of Birth: July 22, 1951

## 2018-09-04 ENCOUNTER — Ambulatory Visit: Payer: PPO | Admitting: Physical Therapy

## 2018-09-04 DIAGNOSIS — M6281 Muscle weakness (generalized): Secondary | ICD-10-CM | POA: Diagnosis not present

## 2018-09-04 DIAGNOSIS — R2681 Unsteadiness on feet: Secondary | ICD-10-CM

## 2018-09-04 DIAGNOSIS — R2689 Other abnormalities of gait and mobility: Secondary | ICD-10-CM

## 2018-09-04 NOTE — Therapy (Signed)
[facility] [address] [place], [place], [address] Phone: [phone]   Fax:  [phone]  Physical Therapy Treatment  Patient Details  Name: Megan [name] MRN: [id number] Date of Birth: April 18, 1951 Referring Provider (PT): Dr. Vertell Limber   Encounter Date: 09/04/2018  PT End of Session - 09/04/18 2147    Visit Number  26    Number of Visits  27    Date for PT Re-Evaluation  09/20/18    Authorization Type  Medicare    PT Start Time  0240    PT Stop Time  1447    PT Time Calculation (min)  45 min    Equipment Utilized During Treatment  Gait belt    Activity Tolerance  Patient tolerated treatment well    Behavior During Therapy  [facility] for tasks assessed/performed       Past Medical History:  Diagnosis Date  . Arthritis    "hands, back" (07/26/2017)  . Dyspnea    W/ PHYS CONDITION   . GERD (gastroesophageal reflux disease)   . History of bronchitis    "not since I quit smoking" (07/26/2017)  . History of kidney stones   . History of shingles   . Hyperlipidemia    takes Fish Oil daily  . Hypertension   . Neuromuscular disorder (HCC)    tingling toes  . 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[facility]) 12/15/2016  . Pneumonia 2009  . PONV (postoperative nausea and vomiting)   . Restless leg   . Type 2 diabetes mellitus ([place])   . Uterine cancer ([facility]) 1979   S/P hysterectomy  . Weakness    numbness and tingling in both feet r/t back    Past Surgical History:  Procedure Laterality Date  . ABDOMINAL EXPOSURE N/A 07/26/2017   Procedure: ABDOMINAL EXPOSURE;  Surgeon: Angelia Mould, MD;  Location: [facility];  Service: Vascular;  Laterality: N/A;  . ANTERIOR CERVICAL DECOMP/DISCECTOMY FUSION  2001  . ANTERIOR LUMBAR FUSION N/A 07/26/2017   Procedure: Lumbar five-Sacral one Anterior lumbar interbody fusion with Dr. Deitra Mayo for approach;  Surgeon: Ditty, Kevan Ny, MD;  Location: [facility];  Service:  Neurosurgery;  Laterality: N/A;  . APPLICATION OF ROBOTIC ASSISTANCE FOR SPINAL PROCEDURE  12/19/2016   Procedure: APPLICATION OF ROBOTIC ASSISTANCE FOR SPINAL PROCEDURE;  Surgeon: Kevan Ny Ditty, MD;  Location: [facility];  Service: Neurosurgery;;  . APPLICATION OF ROBOTIC ASSISTANCE FOR SPINAL PROCEDURE N/A 07/26/2017   Procedure: APPLICATION OF ROBOTIC ASSISTANCE FOR SPINAL PROCEDURE;  Surgeon: Ditty, Kevan Ny, MD;  Location: [facility];  Service: Neurosurgery;  Laterality: N/A;  . APPLICATION OF ROBOTIC ASSISTANCE FOR SPINAL PROCEDURE N/A 09/02/2017   Procedure: APPLICATION OF ROBOTIC ASSISTANCE FOR SPINAL PROCEDURE;  Surgeon: Ditty, Kevan Ny, MD;  Location: [facility];  Service: Neurosurgery;  Laterality: N/A;  . BACK SURGERY    . BLADDER SUSPENSION  1991   tack  . INGUINAL HERNIA REPAIR Right 1991  . LUMBAR FUSION  2016; 2017; 07/26/2017   L4-5; L2-3; L5-S1  . POSTERIOR LUMBAR FUSION 4 WITH HARDWARE REMOVAL N/A 09/02/2017   Procedure: Lumbar three-four redo laminectomy; Repositioning of Left Sacral two screw; Extension of lumbar fusion to Thoracic twelve;  Surgeon: Ditty, Kevan Ny, MD;  Location: [facility];  Service: Neurosurgery;  Laterality: N/A;  . TONSILLECTOMY AND ADENOIDECTOMY  1959  . TUMOR EXCISION     WERTHIN'S TUMORS BOTH SIDES OF NECK  . VAGINAL HYSTERECTOMY  1979    There were no  vitals filed for this visit.  Subjective Assessment - 09/04/18 2124    Subjective  Pt states she forgot to bring her Swedish knee cage to PT today; pt ambulating with use of rollator      Patient is accompained by:  Family member   daughter   Pertinent History  HTN, DM2, left T10 costotransversectomy, T10-T11 laminectomy for decompression, pedicle screws, T9-T12 (4/18), L5-S1 anterior retroperitoneal exposure, L5-S1 laminectomy, L5-S1 posterior lateral fixation with fusion (11/18); chronic pain    Patient Stated Goals  improve balance and transition to use of cane    Currently in Pain?   No/denies                       [facility] Adult PT Treatment/Exercise - 09/04/18 1405      Ambulation/Gait   Ambulation/Gait  Yes    Ambulation/Gait Assistance  4: Min assist    Ambulation/Gait Assistance Details  Swedish knee cage on RLE    Ambulation Distance (Feet)  350 Feet   3 laps = 350' total distance; seated rest period between lap   Assistive device  Small based quad cane    Gait Pattern  Step-to pattern;Decreased dorsiflexion - right;Decreased step length - right;Right genu recurvatum    Ambulation Surface  Level;Indoor    Pre-Gait Activities  Rt knee hyperextension with decr. dorsiflexion RLE noted with fatigue       Knee/Hip Exercises: Stretches   Active Hamstring Stretch  Right;1 rep;30 seconds   runner's stretch at step   Gastroc Stretch  Right;1 rep;30 seconds   in standing      Knee/Hip Exercises: Standing   Heel Raises  10 reps;Both    Forward Step Up  Right;Hand Hold: 2;1 set;Step Height: 6"    Forward Step Up Limitations  Rt knee buckled on rep #10 due to quad weakness     Step Down  Right;1 set;10 reps;Hand Hold: 2;Step Height: 4"               PT Short Term Goals - 07/05/18 1110      PT SHORT TERM GOAL #1   Title  patient to be independent with initial HEP for strength, balance and gait. (All STGs due 07/04/18)    Baseline  07/05/18: pt is independent with current HEP    Time  --    Period  --    Status  Achieved      PT SHORT TERM GOAL #2   Title  Patient to demonstrate TUG with rollator to </= 16 seconds for reduced fall risk    Baseline  07/05/18: 18.19 sec's with rollator, improved just not to goal    Time  --    Period  --    Status  Partially Met      PT SHORT TERM GOAL #3   Title  Patient to demonstrate 400' gait on level surfaces with rollator at Mod I without LOB or overt instability    Baseline  07/05/18: met today with rollator    Time  --    Period  --    Status  Achieved      PT SHORT TERM GOAL #4   Title   Patient to perform sit to stand x 10 with unilateral UE support with Mod I demonstrating improved strength and efficiency of transfers    Baseline  07/05/18: performed 10 reps with single UE support on chair, needed rollator for stability upon standing, no physical assist needed.  Time  --    Period  --    Status  Achieved        PT Long Term Goals - 08/09/18 1531      PT LONG TERM GOAL #1   Title  Patient to be independent with final HEP for balance, strength, gait and other functional mobility. (All LTGs due 09/20/18)    Time  13    Period  Weeks    Status  On-going      PT LONG TERM GOAL #2   Title  patient to demonstrate gait with LRAD over various levels and surfaces for >/= 500' with MOD I without LOB or overt instability    Baseline  08/09/18 - supervision on compliant surfaces    Time  13    Period  Weeks    Status  On-going      PT LONG TERM GOAL #3   Title  Patient to improve TUG to </= 13 seconds with LRAD demonstrating reduced fall risk    Baseline  08/09/18: 18.06    Time  13    Period  Weeks    Status  On-going      PT LONG TERM GOAL #4   Title  Patient to improve gait velocity with LRAD to at least 89f/sec for decreased fall risk    Baseline  2.02 ft/sec - 08/09/18    Time  13    Period  Weeks    Status  Achieved      PT LONG TERM GOAL #5   Title  Paitent to improve R LE strength by >/= 1 manual muscle grade demonstrating improved strength and functional use    Baseline  05/23/18: R LE grossly 3-/5    Time  13    Period  Weeks    Status  Deferred            Plan - 09/04/18 2147    Clinical Impression Statement  Pt presents with right quad weakness with Rt knee buckling on 10th rep of forward step ups onto 6" step with bilateral UE support;  Rt knee hyperextension noted with fatigue even with use of Swedish knee cage and also decreased eccentric dorsiflexor strength noted in stance with fatigue.  Pt able to amb. 115' (1 lap) and then required seated  rest period.                         Rehab Potential  Good    PT Frequency  2x / week    PT Duration  Other (comment)    PT Treatment/Interventions  ADLs/Self Care Home Management;Cryotherapy;Electrical Stimulation;DME Instruction;Moist Heat;Gait training;Stair training;Functional mobility training;Therapeutic activities;Therapeutic exercise;Balance training;Patient/family education;Neuromuscular re-education;Manual techniques;Taping;Passive range of motion;Orthotic Fit/Training    PT Next Visit Plan  Gait training with small based quad cane/knee brace, continue to work on core strengthening/thoracic spine strengthening for improved posture, continue to work on high level balance activites/right LE strengthening;     [place] Agree with Plan of Care  Patient       Patient will benefit from skilled therapeutic intervention in order to improve the following deficits and impairments:  Abnormal gait, Decreased activity tolerance, Decreased balance, Decreased safety awareness, Difficulty walking, Decreased strength, Decreased mobility, Decreased coordination, Pain, Impaired sensation  Visit Diagnosis: Muscle weakness (generalized)  Other abnormalities of gait and mobility  Unsteadiness on feet     Problem List Patient Active  Problem List   Diagnosis Date Noted  . Arthralgia of left hand 07/17/2018  . Debility   . Anemia of chronic disease   . Chronic pain syndrome   . Hypokalemia   . Acute lower UTI 09/04/2017  . L3 vertebral fracture ([place]) 09/01/2017  . Constipation due to pain medication   . Muscle spasms of both lower extremities   . Type 2 diabetes mellitus with peripheral neuropathy (HCC)   . Hypoalbuminemia due to protein-calorie malnutrition ([place])   . Radiculopathy 07/28/2017  . Lumbosacral spondylosis with radiculopathy 07/26/2017  . Paraplegia, incomplete ([place]) 01/07/2017  . 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/27/2016  . Myelopathy ([place]) 12/21/2016   . Neuropathic pain   . 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 Paraparesis ([place]) 12/15/2016  . Type 2 diabetes mellitus without complication, without long-term current use of insulin ([place]) 11/02/2016  . Hyperlipidemia 11/02/2016  . Essential hypertension 11/02/2016  . 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    Alda Lea, PT 09/04/2018, 10:02 PM  [facility] [address] [place], [place], [address] Phone: [phone]   Fax:  [phone]  Name: ANNSLEIGH DRAGOO MRN: [id number] Date of Birth: 1951-02-15

## 2018-09-08 ENCOUNTER — Ambulatory Visit: Payer: PPO

## 2018-09-08 DIAGNOSIS — M6281 Muscle weakness (generalized): Secondary | ICD-10-CM | POA: Diagnosis not present

## 2018-09-08 DIAGNOSIS — R2689 Other abnormalities of gait and mobility: Secondary | ICD-10-CM

## 2018-09-08 DIAGNOSIS — R2681 Unsteadiness on feet: Secondary | ICD-10-CM

## 2018-09-09 NOTE — Therapy (Signed)
[facility] [address] [place], [place], [address] Phone: [phone]   Fax:  [phone]  Physical Therapy Treatment  Patient Details  Name: Megan [name] MRN: [id number] Date of Birth: [birth year] Referring Provider (PT): Dr. Vertell Limber   Encounter Date: 09/08/2018  PT End of Session - 09/08/18 2348    Visit Number  27    Number of Visits  27    Date for PT Re-Evaluation  09/20/18    Authorization Type  Medicare    PT Start Time  1446    PT Stop Time  1530    PT Time Calculation (min)  44 min    Equipment Utilized During Treatment  Gait belt    Activity Tolerance  Patient tolerated treatment well    Behavior During Therapy  [facility] for tasks assessed/performed       Past Medical History:  Diagnosis Date  . Arthritis    "hands, back" (07/26/2017)  . Dyspnea    W/ PHYS CONDITION   . GERD (gastroesophageal reflux disease)   . History of bronchitis    "not since I quit smoking" (07/26/2017)  . History of kidney stones   . History of shingles   . Hyperlipidemia    takes Fish Oil daily  . Hypertension   . Neuromuscular disorder (HCC)    tingling toes  . 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[place]) 12/15/2016  . Pneumonia 2009  . PONV (postoperative nausea and vomiting)   . Restless leg   . Type 2 diabetes mellitus ([place])   . Uterine cancer ([place]) 1979   S/P hysterectomy  . Weakness    numbness and tingling in both feet r/t back    Past Surgical History:  Procedure Laterality Date  . ABDOMINAL EXPOSURE N/A 07/26/2017   Procedure: ABDOMINAL EXPOSURE;  Surgeon: Angelia Mould, MD;  Location: [facility];  Service: Vascular;  Laterality: N/A;  . ANTERIOR CERVICAL DECOMP/DISCECTOMY FUSION  2001  . ANTERIOR LUMBAR FUSION N/A 07/26/2017   Procedure: Lumbar five-Sacral one Anterior lumbar interbody fusion with Dr. Deitra Mayo for approach;  Surgeon: Ditty, Kevan Ny, MD;  Location: [facility];  Service:  Neurosurgery;  Laterality: N/A;  . APPLICATION OF ROBOTIC ASSISTANCE FOR SPINAL PROCEDURE  12/19/2016   Procedure: APPLICATION OF ROBOTIC ASSISTANCE FOR SPINAL PROCEDURE;  Surgeon: Kevan Ny Ditty, MD;  Location: [facility];  Service: Neurosurgery;;  . APPLICATION OF ROBOTIC ASSISTANCE FOR SPINAL PROCEDURE N/A 07/26/2017   Procedure: APPLICATION OF ROBOTIC ASSISTANCE FOR SPINAL PROCEDURE;  Surgeon: Ditty, Kevan Ny, MD;  Location: [facility];  Service: Neurosurgery;  Laterality: N/A;  . APPLICATION OF ROBOTIC ASSISTANCE FOR SPINAL PROCEDURE N/A 09/02/2017   Procedure: APPLICATION OF ROBOTIC ASSISTANCE FOR SPINAL PROCEDURE;  Surgeon: Ditty, Kevan Ny, MD;  Location: [facility];  Service: Neurosurgery;  Laterality: N/A;  . BACK SURGERY    . BLADDER SUSPENSION  1991   tack  . INGUINAL HERNIA REPAIR Right 1991  . LUMBAR FUSION  2016; 2017; 07/26/2017   L4-5; L2-3; L5-S1  . POSTERIOR LUMBAR FUSION 4 WITH HARDWARE REMOVAL N/A 09/02/2017   Procedure: Lumbar three-four redo laminectomy; Repositioning of Left Sacral two screw; Extension of lumbar fusion to Thoracic twelve;  Surgeon: Ditty, Kevan Ny, MD;  Location: [facility];  Service: Neurosurgery;  Laterality: N/A;  . TONSILLECTOMY AND ADENOIDECTOMY  1959  . TUMOR EXCISION     WERTHIN'S TUMORS BOTH SIDES OF NECK  . VAGINAL HYSTERECTOMY  1979    There were no  vitals filed for this visit.  Subjective Assessment - 09/08/18 1448    Subjective  HEP is going well, no falls or complaints.     Patient is accompained by:  Family member    Pertinent History  HTN, DM2, left T10 costotransversectomy, T10-T11 laminectomy for decompression, pedicle screws, T9-T12 (4/18), L5-S1 anterior retroperitoneal exposure, L5-S1 laminectomy, L5-S1 posterior lateral fixation with fusion (11/18); chronic pain    Limitations  Standing;Walking    How long can you walk comfortably?  household distances, limited community ambulation with RW vs rollator    Patient Stated Goals   improve balance and transition to use of cane    Currently in Pain?  No/denies       [facility] Adult PT Treatment/Exercise - 09/08/18 1459      Ambulation/Gait   Ambulation/Gait  Yes    Ambulation/Gait Assistance  5: Supervision    Ambulation/Gait Assistance Details  Gait outdoors with rollator with some fatigue, SBQC indoors negotiating turns and obstacles. Therapist lowered pts rollator for proper height VC's for posture and forward gaze.     Ambulation Distance (Feet)  1000 Feet   17' indoors   Assistive device  Small based quad cane;Rollator;Other (Comment)   RLE sweedish knee brace   Gait Pattern  Step-to pattern;Decreased dorsiflexion - right;Decreased step length - right;Right genu recurvatum    Ambulation Surface  Level;Unlevel;Indoor;Outdoor;Paved;Grass               PT Short Term Goals - 07/05/18 1110      PT SHORT TERM GOAL #1   Title  patient to be independent with initial HEP for strength, balance and gait. (All STGs due 07/04/18)    Baseline  07/05/18: pt is independent with current HEP    Time  --    Period  --    Status  Achieved      PT SHORT TERM GOAL #2   Title  Patient to demonstrate TUG with rollator to </= 16 seconds for reduced fall risk    Baseline  07/05/18: 18.19 sec's with rollator, improved just not to goal    Time  --    Period  --    Status  Partially Met      PT SHORT TERM GOAL #3   Title  Patient to demonstrate 400' gait on level surfaces with rollator at Mod I without LOB or overt instability    Baseline  07/05/18: met today with rollator    Time  --    Period  --    Status  Achieved      PT SHORT TERM GOAL #4   Title  Patient to perform sit to stand x 10 with unilateral UE support with Mod I demonstrating improved strength and efficiency of transfers    Baseline  07/05/18: performed 10 reps with single UE support on chair, needed rollator for stability upon standing, no physical assist needed.     Time  --    Period  --    Status   Achieved        PT Long Term Goals - 08/09/18 1531      PT LONG TERM GOAL #1   Title  Patient to be independent with final HEP for balance, strength, gait and other functional mobility. (All LTGs due 09/20/18)    Time  13    Period  Weeks    Status  On-going      PT LONG TERM GOAL #2   Title  patient  to demonstrate gait with LRAD over various levels and surfaces for >/= 500' with MOD I without LOB or overt instability    Baseline  08/09/18 - supervision on compliant surfaces    Time  13    Period  Weeks    Status  On-going      PT LONG TERM GOAL #3   Title  Patient to improve TUG to </= 13 seconds with LRAD demonstrating reduced fall risk    Baseline  08/09/18: 18.06    Time  13    Period  Weeks    Status  On-going      PT LONG TERM GOAL #4   Title  Patient to improve gait velocity with LRAD to at least 27f/sec for decreased fall risk    Baseline  2.02 ft/sec - 08/09/18    Time  13    Period  Weeks    Status  Achieved      PT LONG TERM GOAL #5   Title  Paitent to improve R LE strength by >/= 1 manual muscle grade demonstrating improved strength and functional use    Baseline  05/23/18: R LE grossly 3-/5    Time  13    Period  Weeks    Status  Deferred            Plan - 09/08/18 2350    Clinical Impression Statement  Todays skilled session focused on gait training with rollator/sweedish knee brace outdoors for 1000' ft negotiating pavement/grass with some fatigue towards end of session, and gait [facility] should benefit from continued PT sessions to progress towards goals.     Rehab Potential  Good    PT Frequency  2x / week    PT Duration  Other (comment)    PT Treatment/Interventions  ADLs/Self Care Home Management;Cryotherapy;Electrical Stimulation;DME Instruction;Moist Heat;Gait training;Stair training;Functional mobility training;Therapeutic activities;Therapeutic exercise;Balance training;Patient/family education;Neuromuscular re-education;Manual  techniques;Taping;Passive range of motion;Orthotic Fit/Training    PT Next Visit Plan  Gait training with small based quad cane/knee brace, continue to work on core strengthening/thoracic spine strengthening for improved posture, continue to work on high level balance activites/right LE strengthening;     PAmeliaand Agree with Plan of Care  Patient       Patient will benefit from skilled therapeutic intervention in order to improve the following deficits and impairments:  Abnormal gait, Decreased activity tolerance, Decreased balance, Decreased safety awareness, Difficulty walking, Decreased strength, Decreased mobility, Decreased coordination, Pain, Impaired sensation  Visit Diagnosis: Muscle weakness (generalized)  Other abnormalities of gait and mobility  Unsteadiness on feet     Problem List Patient Active Problem List   Diagnosis Date Noted  . Arthralgia of left hand 07/17/2018  . Debility   . Anemia of chronic disease   . Chronic pain syndrome   . Hypokalemia   . Acute lower UTI 09/04/2017  . L3 vertebral fracture ([place] 09/01/2017  . Constipation due to pain medication   . Muscle spasms of both lower extremities   . Type 2 diabetes mellitus with peripheral neuropathy (HCC)   . Hypoalbuminemia due to protein-calorie malnutrition ([place]   . Radiculopathy 07/28/2017  . Lumbosacral spondylosis with radiculopathy 07/26/2017  . Paraplegia, incomplete ([place] 01/07/2017  . 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/27/2016  . Myelopathy ([place] 12/21/2016  . Neuropathic pain   . 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 Paraparesis ([place] 12/15/2016  . Type 2 diabetes mellitus without  complication, without long-term current use of insulin ([place]) 11/02/2016  . Hyperlipidemia 11/02/2016  . Essential hypertension 11/02/2016  . 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   Chassity  Felts, PTA  Chassity A Felts 09/09/2018, 12:01 AM  [facility] [address] [place], [place], [address] Phone: [phone]   Fax:  [phone]  Name: Megan [name] MRN: [id number] Date of Birth: 1951-09-01

## 2018-09-11 ENCOUNTER — Encounter: Payer: Self-pay | Admitting: Physical Therapy

## 2018-09-11 ENCOUNTER — Ambulatory Visit: Payer: PPO | Admitting: Physical Therapy

## 2018-09-11 DIAGNOSIS — M6281 Muscle weakness (generalized): Secondary | ICD-10-CM

## 2018-09-11 DIAGNOSIS — R2689 Other abnormalities of gait and mobility: Secondary | ICD-10-CM

## 2018-09-11 DIAGNOSIS — R2681 Unsteadiness on feet: Secondary | ICD-10-CM

## 2018-09-11 NOTE — Addendum Note (Signed)
Addended by: Lamar Benes on: 09/11/2018 05:04 PM   Modules accepted: Orders

## 2018-09-11 NOTE — Therapy (Signed)
[facility] [address] [place], [place], [address] Phone: [phone]   Fax:  [phone]  Physical Therapy Treatment  Patient Details  Name: Megan [name] MRN: [id number] Date of Birth: 1951-09-11 Referring Provider (PT): Dr. Vertell Limber   Encounter Date: 09/11/2018  PT End of Session - 09/11/18 1155    Visit Number  28    Number of Visits  27    Date for PT Re-Evaluation  09/20/18    Authorization Type  Medicare    PT Start Time  1146    PT Stop Time  1226    PT Time Calculation (min)  40 min    Equipment Utilized During Treatment  Gait belt    Activity Tolerance  Patient tolerated treatment well    Behavior During Therapy  [facility] for tasks assessed/performed       Past Medical History:  Diagnosis Date  . Arthritis    "hands, back" (07/26/2017)  . Dyspnea    W/ PHYS CONDITION   . GERD (gastroesophageal reflux disease)   . History of bronchitis    "not since I quit smoking" (07/26/2017)  . History of kidney stones   . History of shingles   . Hyperlipidemia    takes Fish Oil daily  . Hypertension   . Neuromuscular disorder (HCC)    tingling toes  . 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[place]) 12/15/2016  . Pneumonia 2009  . PONV (postoperative nausea and vomiting)   . Restless leg   . Type 2 diabetes mellitus ([place])   . Uterine cancer ([place]) 1979   S/P hysterectomy  . Weakness    numbness and tingling in both feet r/t back    Past Surgical History:  Procedure Laterality Date  . ABDOMINAL EXPOSURE N/A 07/26/2017   Procedure: ABDOMINAL EXPOSURE;  Surgeon: Angelia Mould, MD;  Location: [facility];  Service: Vascular;  Laterality: N/A;  . ANTERIOR CERVICAL DECOMP/DISCECTOMY FUSION  2001  . ANTERIOR LUMBAR FUSION N/A 07/26/2017   Procedure: Lumbar five-Sacral one Anterior lumbar interbody fusion with Dr. Deitra Mayo for approach;  Surgeon: Ditty, Kevan Ny, MD;  Location: [facility];  Service:  Neurosurgery;  Laterality: N/A;  . APPLICATION OF ROBOTIC ASSISTANCE FOR SPINAL PROCEDURE  12/19/2016   Procedure: APPLICATION OF ROBOTIC ASSISTANCE FOR SPINAL PROCEDURE;  Surgeon: Kevan Ny Ditty, MD;  Location: [facility];  Service: Neurosurgery;;  . APPLICATION OF ROBOTIC ASSISTANCE FOR SPINAL PROCEDURE N/A 07/26/2017   Procedure: APPLICATION OF ROBOTIC ASSISTANCE FOR SPINAL PROCEDURE;  Surgeon: Ditty, Kevan Ny, MD;  Location: [facility];  Service: Neurosurgery;  Laterality: N/A;  . APPLICATION OF ROBOTIC ASSISTANCE FOR SPINAL PROCEDURE N/A 09/02/2017   Procedure: APPLICATION OF ROBOTIC ASSISTANCE FOR SPINAL PROCEDURE;  Surgeon: Ditty, Kevan Ny, MD;  Location: [facility];  Service: Neurosurgery;  Laterality: N/A;  . BACK SURGERY    . BLADDER SUSPENSION  1991   tack  . INGUINAL HERNIA REPAIR Right 1991  . LUMBAR FUSION  2016; 2017; 07/26/2017   L4-5; L2-3; L5-S1  . POSTERIOR LUMBAR FUSION 4 WITH HARDWARE REMOVAL N/A 09/02/2017   Procedure: Lumbar three-four redo laminectomy; Repositioning of Left Sacral two screw; Extension of lumbar fusion to Thoracic twelve;  Surgeon: Ditty, Kevan Ny, MD;  Location: [facility];  Service: Neurosurgery;  Laterality: N/A;  . TONSILLECTOMY AND ADENOIDECTOMY  1959  . TUMOR EXCISION     WERTHIN'S TUMORS BOTH SIDES OF NECK  . VAGINAL HYSTERECTOMY  1979    There were no  vitals filed for this visit.  Subjective Assessment - 09/11/18 1151    Subjective  No new complaitns. No falls or pain to report. Still having trouble with vision removed ex's on HEP, other's are getting easy.     Patient is accompained by:  Family member    Pertinent History  HTN, DM2, left T10 costotransversectomy, T10-T11 laminectomy for decompression, pedicle screws, T9-T12 (4/18), L5-S1 anterior retroperitoneal exposure, L5-S1 laminectomy, L5-S1 posterior lateral fixation with fusion (11/18); chronic pain    Limitations  Standing;Walking    How long can you walk comfortably?  household  distances, limited community ambulation with RW vs rollator    Patient Stated Goals  improve balance and transition to use of cane    Currently in Pain?  No/denies    Pain Score  0-No pain         [facility] PT Assessment - 09/11/18 1156      Ambulation/Gait   Ambulation/Gait  Yes    Ambulation/Gait Assistance  6: Modified independent (Device/Increase time)    Ambulation Distance (Feet)  500 Feet   x1, plus around gym with rollator/quad cane   Assistive device  Rollator    Gait Pattern  Step-to pattern;Decreased dorsiflexion - right;Decreased step length - right;Right genu recurvatum    Ambulation Surface  Level;Unlevel;Indoor;Outdoor;Paved    Gait velocity  17.50 secs= 1.87 ft/sec with rollator at self selected pace; 15.22 sec's= 2.15 ft/sec at faster pace with rollator;  58.37 sec's=0.56 ft/sec with small base quad cane     Standardized Balance Assessment   Standardized Balance Assessment  Timed Up and Go Test      Timed Up and Go Test   TUG  Normal TUG    Normal TUG (seconds)  15.79   with rollator; 40.78 with small based quad cane     reviewed current HEP. Remains challenging, did not make any changes.         PT Short Term Goals - 07/05/18 1110      PT SHORT TERM GOAL #1   Title  patient to be independent with initial HEP for strength, balance and gait. (All STGs due 07/04/18)    Baseline  07/05/18: pt is independent with current HEP    Time  --    Period  --    Status  Achieved      PT SHORT TERM GOAL #2   Title  Patient to demonstrate TUG with rollator to </= 16 seconds for reduced fall risk    Baseline  07/05/18: 18.19 sec's with rollator, improved just not to goal    Time  --    Period  --    Status  Partially Met      PT SHORT TERM GOAL #3   Title  Patient to demonstrate 400' gait on level surfaces with rollator at Mod I without LOB or overt instability    Baseline  07/05/18: met today with rollator    Time  --    Period  --    Status  Achieved      PT  SHORT TERM GOAL #4   Title  Patient to perform sit to stand x 10 with unilateral UE support with Mod I demonstrating improved strength and efficiency of transfers    Baseline  07/05/18: performed 10 reps with single UE support on chair, needed rollator for stability upon standing, no physical assist needed.     Time  --    Period  --    Status  Achieved        PT Long Term Goals - 09/11/18 1520      PT LONG TERM GOAL #1   Title  Patient to be independent with final HEP for balance, strength, gait and other functional mobility. (All LTGs due 09/20/18)    Baseline  09/11/18: met with current program    Status  Achieved      PT LONG TERM GOAL #2   Title  patient to demonstrate gait with LRAD over various levels and surfaces for >/= 500' with MOD I without LOB or overt instability    Baseline  09/11/18: met with rollator    Status  Achieved      PT LONG TERM GOAL #3   Title  Patient to improve TUG to </= 13 seconds with LRAD demonstrating reduced fall risk    Baseline  09/11/18:  15.79 sec's with rollator, improved just not to goal    Status  Partially Met      PT LONG TERM GOAL #4   Title  Patient to improve gait velocity with LRAD to at least 65f/sec for decreased fall risk    Baseline  09/11/18: 2.15 ft/sec with rollator (met with this device); 0.56 ft/sec with small base quad cane    Status  Achieved      PT LONG TERM GOAL #5   Title  Paitent to improve R LE strength by >/= 1 manual muscle grade demonstrating improved strength and functional use    Baseline  05/23/18: R LE grossly 3-/5    Status  Deferred            Plan - 09/11/18 1156    Clinical Impression Statement  Today's skilled session focused on LTGs for recert to continue working on gait with cane. Pt noted to have increased foot drop/ankle instablity when using cane vs rollator. May need to use her other brace with gait with cane. Will plan to continue to reassess this.     Rehab Potential  Good    PT Frequency   2x / week    PT Duration  Other (comment)    PT Treatment/Interventions  ADLs/Self Care Home Management;Cryotherapy;Electrical Stimulation;DME Instruction;Moist Heat;Gait training;Stair training;Functional mobility training;Therapeutic activities;Therapeutic exercise;Balance training;Patient/family education;Neuromuscular re-education;Manual techniques;Taping;Passive range of motion;Orthotic Fit/Training    PT Next Visit Plan  use Ipad to tape gait with rollator and cane for comparison with use of knee cage, ? need for stronger brace when using lesser AD such as cane; continue to work on postural and LE strengthening.     PT Home Exercise Plan  [id number]    Consulted and Agree with Plan of Care  Patient       Patient will benefit from skilled therapeutic intervention in order to improve the following deficits and impairments:  Abnormal gait, Decreased activity tolerance, Decreased balance, Decreased safety awareness, Difficulty walking, Decreased strength, Decreased mobility, Decreased coordination, Pain, Impaired sensation  Visit Diagnosis: Muscle weakness (generalized)  Other abnormalities of gait and mobility  Unsteadiness on feet     Problem List Patient Active Problem List   Diagnosis Date Noted  . Arthralgia of left hand 07/17/2018  . Debility   . Anemia of chronic disease   . Chronic pain syndrome   . Hypokalemia   . Acute lower UTI 09/04/2017  . L3 vertebral fracture ([place] 09/01/2017  . Constipation due to pain medication   . Muscle spasms of both lower extremities   . Type 2 diabetes mellitus with peripheral neuropathy (  [place])   . Hypoalbuminemia due to protein-calorie malnutrition ([place])   . Radiculopathy 07/28/2017  . Lumbosacral spondylosis with radiculopathy 07/26/2017  . Paraplegia, incomplete ([place]) 01/07/2017  . 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/27/2016  . Myelopathy ([place]) 12/21/2016  . Neuropathic pain   . 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  Paraparesis ([place]) 12/15/2016  . Type 2 diabetes mellitus without complication, without long-term current use of insulin ([place]) 11/02/2016  . Hyperlipidemia 11/02/2016  . Essential hypertension 11/02/2016  . 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    [name], PTA, [facility] [address], [place], [place] [address] [phone] 09/11/18, 3:22 PM   Name: Megan [name] MRN: [id number] Date of Birth: 06/28/1951

## 2018-09-12 ENCOUNTER — Other Ambulatory Visit: Payer: Self-pay | Admitting: Primary Care

## 2018-09-12 DIAGNOSIS — M4714 Other spondylosis with myelopathy, thoracic region: Secondary | ICD-10-CM

## 2018-09-12 DIAGNOSIS — M4727 Other spondylosis with radiculopathy, lumbosacral region: Secondary | ICD-10-CM

## 2018-09-15 ENCOUNTER — Encounter: Payer: Self-pay | Admitting: Physical Therapy

## 2018-09-15 ENCOUNTER — Other Ambulatory Visit: Payer: Self-pay | Admitting: Primary Care

## 2018-09-15 ENCOUNTER — Ambulatory Visit: Payer: PPO | Attending: Physical Medicine & Rehabilitation | Admitting: Physical Therapy

## 2018-09-15 DIAGNOSIS — M6281 Muscle weakness (generalized): Secondary | ICD-10-CM | POA: Insufficient documentation

## 2018-09-15 DIAGNOSIS — R2681 Unsteadiness on feet: Secondary | ICD-10-CM | POA: Diagnosis not present

## 2018-09-15 DIAGNOSIS — R2689 Other abnormalities of gait and mobility: Secondary | ICD-10-CM | POA: Diagnosis not present

## 2018-09-15 DIAGNOSIS — M62838 Other muscle spasm: Secondary | ICD-10-CM

## 2018-09-16 NOTE — Therapy (Signed)
[facility] [address] [place], [place], [address] Phone: [phone]   Fax:  [phone]  Physical Therapy Treatment  Patient Details  Name: Megan [name] MRN: [id number] Date of Birth: 11/30/1950 Referring Provider (PT): Dr. Vertell Limber   Encounter Date: 09/15/2018  PT End of Session - 09/15/18 1405    Visit Number  29    Number of Visits  27    Date for PT Re-Evaluation  09/20/18    Authorization Type  Medicare    PT Start Time  7253    PT Stop Time  1445    PT Time Calculation (min)  43 min    Equipment Utilized During Treatment  Gait belt    Activity Tolerance  Patient tolerated treatment well    Behavior During Therapy  [facility] for tasks assessed/performed       Past Medical History:  Diagnosis Date  . Arthritis    "hands, back" (07/26/2017)  . Dyspnea    W/ PHYS CONDITION   . GERD (gastroesophageal reflux disease)   . History of bronchitis    "not since I quit smoking" (07/26/2017)  . History of kidney stones   . History of shingles   . Hyperlipidemia    takes Fish Oil daily  . Hypertension   . Neuromuscular disorder (HCC)    tingling toes  . 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[place]) 12/15/2016  . Pneumonia 2009  . PONV (postoperative nausea and vomiting)   . Restless leg   . Type 2 diabetes mellitus ([place])   . Uterine cancer ([place]) 1979   S/P hysterectomy  . Weakness    numbness and tingling in both feet r/t back    Past Surgical History:  Procedure Laterality Date  . ABDOMINAL EXPOSURE N/A 07/26/2017   Procedure: ABDOMINAL EXPOSURE;  Surgeon: Angelia Mould, MD;  Location: [facility];  Service: Vascular;  Laterality: N/A;  . ANTERIOR CERVICAL DECOMP/DISCECTOMY FUSION  2001  . ANTERIOR LUMBAR FUSION N/A 07/26/2017   Procedure: Lumbar five-Sacral one Anterior lumbar interbody fusion with Dr. Deitra Mayo for approach;  Surgeon: Ditty, Kevan Ny, MD;  Location: [facility];  Service:  Neurosurgery;  Laterality: N/A;  . APPLICATION OF ROBOTIC ASSISTANCE FOR SPINAL PROCEDURE  12/19/2016   Procedure: APPLICATION OF ROBOTIC ASSISTANCE FOR SPINAL PROCEDURE;  Surgeon: Kevan Ny Ditty, MD;  Location: [facility];  Service: Neurosurgery;;  . APPLICATION OF ROBOTIC ASSISTANCE FOR SPINAL PROCEDURE N/A 07/26/2017   Procedure: APPLICATION OF ROBOTIC ASSISTANCE FOR SPINAL PROCEDURE;  Surgeon: Ditty, Kevan Ny, MD;  Location: [facility];  Service: Neurosurgery;  Laterality: N/A;  . APPLICATION OF ROBOTIC ASSISTANCE FOR SPINAL PROCEDURE N/A 09/02/2017   Procedure: APPLICATION OF ROBOTIC ASSISTANCE FOR SPINAL PROCEDURE;  Surgeon: Ditty, Kevan Ny, MD;  Location: [facility];  Service: Neurosurgery;  Laterality: N/A;  . BACK SURGERY    . BLADDER SUSPENSION  1991   tack  . INGUINAL HERNIA REPAIR Right 1991  . LUMBAR FUSION  2016; 2017; 07/26/2017   L4-5; L2-3; L5-S1  . POSTERIOR LUMBAR FUSION 4 WITH HARDWARE REMOVAL N/A 09/02/2017   Procedure: Lumbar three-four redo laminectomy; Repositioning of Left Sacral two screw; Extension of lumbar fusion to Thoracic twelve;  Surgeon: Ditty, Kevan Ny, MD;  Location: [facility];  Service: Neurosurgery;  Laterality: N/A;  . TONSILLECTOMY AND ADENOIDECTOMY  1959  . TUMOR EXCISION     WERTHIN'S TUMORS BOTH SIDES OF NECK  . VAGINAL HYSTERECTOMY  1979    There were no  vitals filed for this visit.  Subjective Assessment - 09/15/18 1405    Subjective  No new complaints. No falls or pain to report. Does report being tired today due to not sleeping well last night.     Patient is accompained by:  Family member    Pertinent History  HTN, DM2, left T10 costotransversectomy, T10-T11 laminectomy for decompression, pedicle screws, T9-T12 (4/18), L5-S1 anterior retroperitoneal exposure, L5-S1 laminectomy, L5-S1 posterior lateral fixation with fusion (11/18); chronic pain    Limitations  Standing;Walking    How long can you walk comfortably?  household distances,  limited community ambulation with RW vs rollator    Patient Stated Goals  improve balance and transition to use of cane    Currently in Pain?  Yes    Pain Score  5     Pain Location  Back    Pain Orientation  Lower;Mid    Pain Descriptors / Indicators  Dull;Aching    Pain Type  Chronic pain    Pain Onset  Today    Pain Frequency  Intermittent    Aggravating Factors   unsure, woke up with worse back pain this morning, possibly the weather    Pain Relieving Factors  lying down            [facility] Adult PT Treatment/Exercise - 09/15/18 1408      High Level Balance   High Level Balance Activities  Side stepping;Marching forwards;Marching backwards;Tandem walking   tandem fwd/bwd   High Level Balance Comments  on red mat in parallel bars with light UE touch for balance- 4 laps each with up to min assist for balance and cues on ex form/technique.      Knee/Hip Exercises: Aerobic   Nustep  LE's at level 4 for 8 mintues with goal >/= 40 steps per mintue for strengthening          Balance Exercises - 09/15/18 1426      Balance Exercises: Standing   Standing Eyes Opened  Narrow base of support (BOS);Head turns;Foam/compliant surface;Other reps (comment);Limitations    Standing Eyes Closed  Narrow base of support (BOS);Foam/compliant surface;Head turns;Other reps (comment);30 secs;Limitations    Rockerboard  Anterior/posterior;Lateral;EO;EC;30 seconds;Intermittent UE support      Balance Exercises: Standing   Standing Eyes Opened Limitations  on airex with no UE support to occasional touch to parallel bars, feet together: head movements left<>right, then up<>down with min guard to min assist for balance.     Standing Eyes Closed Limitations  on airex with feet together: EC no head movements, progressing to EC with head movements nausea reported after about 2-3 reps each side to side, up and down, therefore performed them with eyes open    Rebounder Limitations  both ways on balance  board: rocking board with EO, progressing to EC. then holding the board steady with EC.           PT Short Term Goals - 07/05/18 1110      PT SHORT TERM GOAL #1   Title  patient to be independent with initial HEP for strength, balance and gait. (All STGs due 07/04/18)    Baseline  07/05/18: pt is independent with current HEP    Time  --    Period  --    Status  Achieved      PT SHORT TERM GOAL #2   Title  Patient to demonstrate TUG with rollator to </= 16 seconds for reduced fall risk    Baseline  07/05/18: 18.19 sec's with rollator, improved just not to goal    Time  --    Period  --    Status  Partially Met      PT SHORT TERM GOAL #3   Title  Patient to demonstrate 400' gait on level surfaces with rollator at Mod I without LOB or overt instability    Baseline  07/05/18: met today with rollator    Time  --    Period  --    Status  Achieved      PT SHORT TERM GOAL #4   Title  Patient to perform sit to stand x 10 with unilateral UE support with Mod I demonstrating improved strength and efficiency of transfers    Baseline  07/05/18: performed 10 reps with single UE support on chair, needed rollator for stability upon standing, no physical assist needed.     Time  --    Period  --    Status  Achieved        PT Long Term Goals - 09/11/18 1520      PT LONG TERM GOAL #1   Title  Patient to be independent with final HEP for balance, strength, gait and other functional mobility. (All LTGs due 09/20/18)    Baseline  09/11/18: met with current program    Status  Achieved      PT LONG TERM GOAL #2   Title  patient to demonstrate gait with LRAD over various levels and surfaces for >/= 500' with MOD I without LOB or overt instability    Baseline  09/11/18: met with rollator    Status  Achieved      PT LONG TERM GOAL #3   Title  Patient to improve TUG to </= 13 seconds with LRAD demonstrating reduced fall risk    Baseline  09/11/18:  15.79 sec's with rollator, improved just not  to goal    Status  Partially Met      PT LONG TERM GOAL #4   Title  Patient to improve gait velocity with LRAD to at least 87f/sec for decreased fall risk    Baseline  09/11/18: 2.15 ft/sec with rollator (met with this device); 0.56 ft/sec with small base quad cane    Status  Achieved      PT LONG TERM GOAL #5   Title  Paitent to improve R LE strength by >/= 1 manual muscle grade demonstrating improved strength and functional use    Baseline  05/23/18: R LE grossly 3-/5    Status  Deferred            Plan - 09/15/18 1406    Clinical Impression Statement  Today's skilled session was limited due to pt with increase in back pain (has not had pain for last few sessions). Focused on strengthening and balance as tolerated with pain after session rated 5/10. Will resume gait with cane at next session as pain allows with intention to assess how her ankle does and need for further bracing besides knee cage when using cane. The pt is progressing toward goals and should benefit from continued PT to progress toward unmet goals.     Rehab Potential  Good    PT Frequency  2x / week    PT Duration  Other (comment)    PT Treatment/Interventions  ADLs/Self Care Home Management;Cryotherapy;Electrical Stimulation;DME Instruction;Moist Heat;Gait training;Stair training;Functional mobility training;Therapeutic activities;Therapeutic exercise;Balance training;Patient/family education;Neuromuscular re-education;Manual techniques;Taping;Passive range of motion;Orthotic Fit/Training    PT Next Visit  Plan  continue gait with small base quad cane, ? need for stronger brace when using lesser AD such as cane; continue to work on postural and LE strengthening.     PT Home Exercise Plan  [id number]    Consulted and Agree with Plan of Care  Patient       Patient will benefit from skilled therapeutic intervention in order to improve the following deficits and impairments:  Abnormal gait, Decreased activity tolerance,  Decreased balance, Decreased safety awareness, Difficulty walking, Decreased strength, Decreased mobility, Decreased coordination, Pain, Impaired sensation  Visit Diagnosis: Muscle weakness (generalized)  Other abnormalities of gait and mobility  Unsteadiness on feet     Problem List Patient Active Problem List   Diagnosis Date Noted  . Arthralgia of left hand 07/17/2018  . Debility   . Anemia of chronic disease   . Chronic pain syndrome   . Hypokalemia   . Acute lower UTI 09/04/2017  . L3 vertebral fracture ([place]) 09/01/2017  . Constipation due to pain medication   . Muscle spasms of both lower extremities   . Type 2 diabetes mellitus with peripheral neuropathy (HCC)   . Hypoalbuminemia due to protein-calorie malnutrition ([place])   . Radiculopathy 07/28/2017  . Lumbosacral spondylosis with radiculopathy 07/26/2017  . Paraplegia, incomplete ([place]) 01/07/2017  . 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/27/2016  . Myelopathy ([place]) 12/21/2016  . Neuropathic pain   . 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 Paraparesis ([place]) 12/15/2016  . Type 2 diabetes mellitus without complication, without long-term current use of insulin ([place]) 11/02/2016  . Hyperlipidemia 11/02/2016  . Essential hypertension 11/02/2016  . 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    [name], PTA, [facility] [address], [place],  [address] [phone] 09/16/18, 6:50 PM   Name: Megan [name] MRN: [id number] Date of Birth: May 26, 1951

## 2018-09-19 ENCOUNTER — Ambulatory Visit: Payer: PPO | Admitting: Physical Therapy

## 2018-09-19 DIAGNOSIS — M6281 Muscle weakness (generalized): Secondary | ICD-10-CM | POA: Diagnosis not present

## 2018-09-19 DIAGNOSIS — R2689 Other abnormalities of gait and mobility: Secondary | ICD-10-CM

## 2018-09-19 DIAGNOSIS — R2681 Unsteadiness on feet: Secondary | ICD-10-CM

## 2018-09-19 NOTE — Therapy (Addendum)
[facility] [address] [place], [place], [address] Phone: [phone]   Fax:  [phone]  Physical Therapy Treatment/Progress note Progress Note reporting period date to date 05/23/18 to 09/19/2018  See below for objective and subjective measurements relating to patients progress with PT.  Patient Details  Name: Megan [name] MRN: [id number] Date of Birth: [birth year] Referring Provider (PT): Dr. Vertell Limber   Encounter Date: 09/19/2018  PT End of Session - 09/19/18 1637    Visit Number  30    Number of Visits  45    Date for PT Re-Evaluation  11/11/18    Authorization Type  Medicare    PT Start Time  1400    PT Stop Time  1445    PT Time Calculation (min)  45 min    Equipment Utilized During Treatment  Gait belt    Activity Tolerance  Patient tolerated treatment well    Behavior During Therapy  [facility] for tasks assessed/performed       Past Medical History:  Diagnosis Date  . Arthritis    "hands, back" (07/26/2017)  . Dyspnea    W/ PHYS CONDITION   . GERD (gastroesophageal reflux disease)   . History of bronchitis    "not since I quit smoking" (07/26/2017)  . History of kidney stones   . History of shingles   . Hyperlipidemia    takes Fish Oil daily  . Hypertension   . Neuromuscular disorder (HCC)    tingling toes  . 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[place]) 12/15/2016  . Pneumonia 2009  . PONV (postoperative nausea and vomiting)   . Restless leg   . Type 2 diabetes mellitus ([place])   . Uterine cancer ([place]) 1979   S/P hysterectomy  . Weakness    numbness and tingling in both feet r/t back    Past Surgical History:  Procedure Laterality Date  . ABDOMINAL EXPOSURE N/A 07/26/2017   Procedure: ABDOMINAL EXPOSURE;  Surgeon: Angelia Mould, MD;  Location: [place] [facility];  Service: Vascular;  Laterality: N/A;  . ANTERIOR CERVICAL DECOMP/DISCECTOMY FUSION  2001  . ANTERIOR LUMBAR FUSION N/A 07/26/2017   Procedure:  Lumbar five-Sacral one Anterior lumbar interbody fusion with Dr. Deitra Mayo for approach;  Surgeon: Ditty, Kevan Ny, MD;  Location: [facility];  Service: Neurosurgery;  Laterality: N/A;  . APPLICATION OF ROBOTIC ASSISTANCE FOR SPINAL PROCEDURE  12/19/2016   Procedure: APPLICATION OF ROBOTIC ASSISTANCE FOR SPINAL PROCEDURE;  Surgeon: Kevan Ny Ditty, MD;  Location: [facility];  Service: Neurosurgery;;  . APPLICATION OF ROBOTIC ASSISTANCE FOR SPINAL PROCEDURE N/A 07/26/2017   Procedure: APPLICATION OF ROBOTIC ASSISTANCE FOR SPINAL PROCEDURE;  Surgeon: Ditty, Kevan Ny, MD;  Location: [place];  Service: Neurosurgery;  Laterality: N/A;  . APPLICATION OF ROBOTIC ASSISTANCE FOR SPINAL PROCEDURE N/A 09/02/2017   Procedure: APPLICATION OF ROBOTIC ASSISTANCE FOR SPINAL PROCEDURE;  Surgeon: Ditty, Kevan Ny, MD;  Location: [facility];  Service: Neurosurgery;  Laterality: N/A;  . BACK SURGERY    . BLADDER SUSPENSION  1991   tack  . INGUINAL HERNIA REPAIR Right 1991  . LUMBAR FUSION  2016; 2017; 07/26/2017   L4-5; L2-3; L5-S1  . POSTERIOR LUMBAR FUSION 4 WITH HARDWARE REMOVAL N/A 09/02/2017   Procedure: Lumbar three-four redo laminectomy; Repositioning of Left Sacral two screw; Extension of lumbar fusion to Thoracic twelve;  Surgeon: Ditty, Kevan Ny, MD;  Location: [facility];  Service: Neurosurgery;  Laterality: N/A;  . TONSILLECTOMY AND ADENOIDECTOMY  1959  .  TUMOR EXCISION     WERTHIN'S TUMORS BOTH SIDES OF NECK  . VAGINAL HYSTERECTOMY  1979    There were no vitals filed for this visit.  Subjective Assessment - 09/19/18 1630    Subjective  no complaints other than her knee cage orthothic may be a little too big. PT checked this and it is not able to be tightened anymore so she was recommended to see orthotist about this.     Pertinent History  HTN, DM2, left T10 costotransversectomy, T10-T11 laminectomy for decompression, pedicle screws, T9-T12 (4/18), L5-S1 anterior retroperitoneal  exposure, L5-S1 laminectomy, L5-S1 posterior lateral fixation with fusion (11/18); chronic pain    Limitations  Standing;Walking    Currently in Pain?  Yes    Pain Score  --   moderate amount of back pain                      [facility] Adult PT Treatment/Exercise - 09/19/18 0001      Ambulation/Gait   Ambulation/Gait  Yes    Ambulation/Gait Assistance  4: Min guard    Ambulation/Gait Assistance Details  230    Assistive device  Small based quad cane    Gait Pattern  Step-to pattern;Decreased dorsiflexion - right;Decreased step length - right;Right genu recurvatum      High Level Balance   High Level Balance Comments  at counter sidestepping, retro walking up/down X 3      Knee/Hip Exercises: Standing   Forward Step Up  10 reps;Hand Hold: 2;Step Height: 6"    Stairs  up down X 2      Knee/Hip Exercises: Seated   Sit to Sand  10 reps               PT Short Term Goals - 07/05/18 1110      PT SHORT TERM GOAL #1   Title  patient to be independent with initial HEP for strength, balance and gait. (All STGs due 07/04/18)    Baseline  07/05/18: pt is independent with current HEP    Time  --    Period  --    Status  Achieved      PT SHORT TERM GOAL #2   Title  Patient to demonstrate TUG with rollator to </= 16 seconds for reduced fall risk    Baseline  07/05/18: 18.19 sec's with rollator, improved just not to goal    Time  --    Period  --    Status  Partially Met      PT SHORT TERM GOAL #3   Title  Patient to demonstrate 400' gait on level surfaces with rollator at Mod I without LOB or overt instability    Baseline  07/05/18: met today with rollator    Time  --    Period  --    Status  Achieved      PT SHORT TERM GOAL #4   Title  Patient to perform sit to stand x 10 with unilateral UE support with Mod I demonstrating improved strength and efficiency of transfers    Baseline  07/05/18: performed 10 reps with single UE support on chair, needed rollator  for stability upon standing, no physical assist needed.     Time  --    Period  --    Status  Achieved        PT Long Term Goals - 09/11/18 1520      PT LONG TERM GOAL #1  Title  Patient to be independent with final HEP for balance, strength, gait and other functional mobility. (All LTGs due 09/20/18)    Baseline  09/11/18: met with current program    Status  Achieved      PT LONG TERM GOAL #2   Title  patient to demonstrate gait with LRAD over various levels and surfaces for >/= 500' with MOD I without LOB or overt instability    Baseline  09/11/18: met with rollator    Status  Achieved      PT LONG TERM GOAL #3   Title  Patient to improve TUG to </= 13 seconds with LRAD demonstrating reduced fall risk    Baseline  09/11/18:  15.79 sec's with rollator, improved just not to goal    Status  Partially Met      PT LONG TERM GOAL #4   Title  Patient to improve gait velocity with LRAD to at least 35f/sec for decreased fall risk    Baseline  09/11/18: 2.15 ft/sec with rollator (met with this device); 0.56 ft/sec with small base quad cane    Status  Achieved      PT LONG TERM GOAL #5   Title  Paitent to improve R LE strength by >/= 1 manual muscle grade demonstrating improved strength and functional use    Baseline  05/23/18: R LE grossly 3-/5    Status  Deferred            Plan - 09/19/18 1640    Clinical Impression Statement  Pt continues to show good effort and will continue to benefit from skilled PT for gait, balance, and leg strength. PT remains medically necessary at this point. She is slowly improving gait with SPC with knee orthosis but this may be too big for her now. She also continues to fatigue quickly. PT will continue to progress as able.     Rehab Potential  Good    PT Frequency  2x / week    PT Duration  Other (comment)    PT Treatment/Interventions  ADLs/Self Care Home Management;Cryotherapy;Electrical Stimulation;DME Instruction;Moist Heat;Gait training;Stair  training;Functional mobility training;Therapeutic activities;Therapeutic exercise;Balance training;Patient/family education;Neuromuscular re-education;Manual techniques;Taping;Passive range of motion;Orthotic Fit/Training    PT Next Visit Plan  continue gait with small base quad cane, ? need for stronger brace when using lesser AD such as cane; continue to work on postural and LE strengthening.     PT Home Exercise Plan  [id number]    Consulted and Agree with Plan of Care  Patient       Patient will benefit from skilled therapeutic intervention in order to improve the following deficits and impairments:  Abnormal gait, Decreased activity tolerance, Decreased balance, Decreased safety awareness, Difficulty walking, Decreased strength, Decreased mobility, Decreased coordination, Pain, Impaired sensation  Visit Diagnosis: Muscle weakness (generalized)  Other abnormalities of gait and mobility  Unsteadiness on feet     Problem List Patient Active Problem List   Diagnosis Date Noted  . Arthralgia of left hand 07/17/2018  . Debility   . Anemia of chronic disease   . Chronic pain syndrome   . Hypokalemia   . Acute lower UTI 09/04/2017  . L3 vertebral fracture ([place] 09/01/2017  . Constipation due to pain medication   . Muscle spasms of both lower extremities   . Type 2 diabetes mellitus with peripheral neuropathy (HCC)   . Hypoalbuminemia due to protein-calorie malnutrition ([place]   . Radiculopathy 07/28/2017  . Lumbosacral spondylosis with radiculopathy 07/26/2017  .  Paraplegia, incomplete ([place]) 01/07/2017  . 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/27/2016  . Myelopathy ([place]) 12/21/2016  . Neuropathic pain   . 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 Paraparesis ([place]) 12/15/2016  . Type 2 diabetes mellitus without complication, without long-term current use of insulin ([place]) 11/02/2016  . Hyperlipidemia 11/02/2016  . Essential hypertension 11/02/2016  . Congenital  spondylolisthesis of lumbar region 04/27/2016  . 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    Silvestre Mesi 09/19/2018, 4:44 PM  [facility] [address] [place], [place], [address] Phone: [phone]   Fax:  [phone]  Name: Megan [name] MRN: [id number] Date of Birth: November 14, 1950

## 2018-09-22 ENCOUNTER — Ambulatory Visit: Payer: PPO

## 2018-09-22 DIAGNOSIS — M6281 Muscle weakness (generalized): Secondary | ICD-10-CM

## 2018-09-22 DIAGNOSIS — R2689 Other abnormalities of gait and mobility: Secondary | ICD-10-CM

## 2018-09-22 DIAGNOSIS — R2681 Unsteadiness on feet: Secondary | ICD-10-CM

## 2018-09-22 NOTE — Therapy (Signed)
[facility] [address] [place], [place], [address] Phone: [phone]   Fax:  [phone]  Physical Therapy Treatment  Patient Details  Name: Megan [name] MRN: [id number] Date of Birth: 1951-07-27 Referring Provider (PT): Dr. Vertell Limber   Encounter Date: 09/22/2018  PT End of Session - 09/22/18 1415    Visit Number  31    Number of Visits  45    Date for PT Re-Evaluation  11/11/18    Authorization Type  Medicare    PT Start Time  1401    PT Stop Time  1444    PT Time Calculation (min)  43 min    Equipment Utilized During Treatment  Gait belt    Activity Tolerance  Patient tolerated treatment well    Behavior During Therapy  [facility] for tasks assessed/performed       Past Medical History:  Diagnosis Date  . Arthritis    "hands, back" (07/26/2017)  . Dyspnea    W/ PHYS CONDITION   . GERD (gastroesophageal reflux disease)   . History of bronchitis    "not since I quit smoking" (07/26/2017)  . History of kidney stones   . History of shingles   . Hyperlipidemia    takes Fish Oil daily  . Hypertension   . Neuromuscular disorder (HCC)    tingling toes  . 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[place]) 12/15/2016  . Pneumonia 2009  . PONV (postoperative nausea and vomiting)   . Restless leg   . Type 2 diabetes mellitus ([place])   . Uterine cancer ([place]) 1979   S/P hysterectomy  . Weakness    numbness and tingling in both feet r/t back    Past Surgical History:  Procedure Laterality Date  . ABDOMINAL EXPOSURE N/A 07/26/2017   Procedure: ABDOMINAL EXPOSURE;  Surgeon: Angelia Mould, MD;  Location: [place] [facility];  Service: Vascular;  Laterality: N/A;  . ANTERIOR CERVICAL DECOMP/DISCECTOMY FUSION  2001  . ANTERIOR LUMBAR FUSION N/A 07/26/2017   Procedure: Lumbar five-Sacral one Anterior lumbar interbody fusion with Dr. Deitra Mayo for approach;  Surgeon: Ditty, Kevan Ny, MD;  Location: [place];  Service:  Neurosurgery;  Laterality: N/A;  . APPLICATION OF ROBOTIC ASSISTANCE FOR SPINAL PROCEDURE  12/19/2016   Procedure: APPLICATION OF ROBOTIC ASSISTANCE FOR SPINAL PROCEDURE;  Surgeon: Kevan Ny Ditty, MD;  Location: [facility];  Service: Neurosurgery;;  . APPLICATION OF ROBOTIC ASSISTANCE FOR SPINAL PROCEDURE N/A 07/26/2017   Procedure: APPLICATION OF ROBOTIC ASSISTANCE FOR SPINAL PROCEDURE;  Surgeon: Ditty, Kevan Ny, MD;  Location: [facility];  Service: Neurosurgery;  Laterality: N/A;  . APPLICATION OF ROBOTIC ASSISTANCE FOR SPINAL PROCEDURE N/A 09/02/2017   Procedure: APPLICATION OF ROBOTIC ASSISTANCE FOR SPINAL PROCEDURE;  Surgeon: Ditty, Kevan Ny, MD;  Location: [facility];  Service: Neurosurgery;  Laterality: N/A;  . BACK SURGERY    . BLADDER SUSPENSION  1991   tack  . INGUINAL HERNIA REPAIR Right 1991  . LUMBAR FUSION  2016; 2017; 07/26/2017   L4-5; L2-3; L5-S1  . POSTERIOR LUMBAR FUSION 4 WITH HARDWARE REMOVAL N/A 09/02/2017   Procedure: Lumbar three-four redo laminectomy; Repositioning of Left Sacral two screw; Extension of lumbar fusion to Thoracic twelve;  Surgeon: Ditty, Kevan Ny, MD;  Location: [facility];  Service: Neurosurgery;  Laterality: N/A;  . TONSILLECTOMY AND ADENOIDECTOMY  1959  . TUMOR EXCISION     WERTHIN'S TUMORS BOTH SIDES OF NECK  . VAGINAL HYSTERECTOMY  1979    There were no  vitals filed for this visit.  Subjective Assessment - 09/22/18 1410    Subjective  No falls to report, Pt states she tightened her knee brace and has no complaints since. Pt reports back bain from bending over more than usual to pick up her dog while sitting in her scooter.    Patient is accompained by:  Family member    Pertinent History  HTN, DM2, left T10 costotransversectomy, T10-T11 laminectomy for decompression, pedicle screws, T9-T12 (4/18), L5-S1 anterior retroperitoneal exposure, L5-S1 laminectomy, L5-S1 posterior lateral fixation with fusion (11/18); chronic pain    Limitations   Standing;Walking    How long can you walk comfortably?  household distances, limited community ambulation with RW vs rollator    Patient Stated Goals  improve balance and transition to use of cane    Currently in Pain?  Yes    Pain Score  7     Pain Location  Back    Pain Orientation  Lower    Pain Descriptors / Indicators  Aching;Dull    Pain Type  Acute pain    Pain Onset  Today    Pain Frequency  Constant         [facility] Adult PT Treatment/Exercise - 09/22/18 1438      Ambulation/Gait   Ambulation/Gait  Yes    Ambulation/Gait Assistance  4: Min guard    Ambulation/Gait Assistance Details  Pt presented with some knee buckling after initial lap, pt stated that she did not feel fatigued but C/O "tingling" in RLE when not WB on RLE.     Ambulation Distance (Feet)  230 Feet   115   Assistive device  Small based quad cane;Other (Comment)   sweedish knee cage   Gait Pattern  Step-to pattern;Decreased dorsiflexion - right;Decreased step length - right;Right genu recurvatum      Knee/Hip Exercises: Aerobic   Nustep  L4 10 mins        PT Short Term Goals - 07/05/18 1110      PT SHORT TERM GOAL #1   Title  patient to be independent with initial HEP for strength, balance and gait. (All STGs due 07/04/18)    Baseline  07/05/18: pt is independent with current HEP    Time  --    Period  --    Status  Achieved      PT SHORT TERM GOAL #2   Title  Patient to demonstrate TUG with rollator to </= 16 seconds for reduced fall risk    Baseline  07/05/18: 18.19 sec's with rollator, improved just not to goal    Time  --    Period  --    Status  Partially Met      PT SHORT TERM GOAL #3   Title  Patient to demonstrate 400' gait on level surfaces with rollator at Mod I without LOB or overt instability    Baseline  07/05/18: met today with rollator    Time  --    Period  --    Status  Achieved      PT SHORT TERM GOAL #4   Title  Patient to perform sit to stand x 10 with unilateral UE  support with Mod I demonstrating improved strength and efficiency of transfers    Baseline  07/05/18: performed 10 reps with single UE support on chair, needed rollator for stability upon standing, no physical assist needed.     Time  --    Period  --  Status  Achieved        PT Long Term Goals - 09/11/18 1520      PT LONG TERM GOAL #1   Title  Patient to be independent with final HEP for balance, strength, gait and other functional mobility. (All LTGs due 09/20/18)    Baseline  09/11/18: met with current program    Status  Achieved      PT LONG TERM GOAL #2   Title  patient to demonstrate gait with LRAD over various levels and surfaces for >/= 500' with MOD I without LOB or overt instability    Baseline  09/11/18: met with rollator    Status  Achieved      PT LONG TERM GOAL #3   Title  Patient to improve TUG to </= 13 seconds with LRAD demonstrating reduced fall risk    Baseline  09/11/18:  15.79 sec's with rollator, improved just not to goal    Status  Partially Met      PT LONG TERM GOAL #4   Title  Patient to improve gait velocity with LRAD to at least 63f/sec for decreased fall risk    Baseline  09/11/18: 2.15 ft/sec with rollator (met with this device); 0.56 ft/sec with small base quad cane    Status  Achieved      PT LONG TERM GOAL #5   Title  Paitent to improve R LE strength by >/= 1 manual muscle grade demonstrating improved strength and functional use    Baseline  05/23/18: R LE grossly 3-/5    Status  Deferred            Plan - 09/22/18 1646    Clinical Impression Statement  Todays skilled session focused on gait training with SBQC/sweedish knee cage with on C/O brace feeling loose, pt did present with minimal knee buckling after initial lap of gait training in rehab gym, and BLE strengthening on Nustep. Pt is making progress and should benefit from continued PT sessions to progress towards goals.     Rehab Potential  Good    PT Frequency  2x / week    PT  Duration  Other (comment)    PT Treatment/Interventions  ADLs/Self Care Home Management;Cryotherapy;Electrical Stimulation;DME Instruction;Moist Heat;Gait training;Stair training;Functional mobility training;Therapeutic activities;Therapeutic exercise;Balance training;Patient/family education;Neuromuscular re-education;Manual techniques;Taping;Passive range of motion;Orthotic Fit/Training    PT Next Visit Plan  continue gait with small base quad cane, ? need for stronger brace when using lesser AD such as cane; continue to work on postural and LE strengthening.     PT Home Exercise Plan  [id number]    Consulted and Agree with Plan of Care  Patient       Patient will benefit from skilled therapeutic intervention in order to improve the following deficits and impairments:  Abnormal gait, Decreased activity tolerance, Decreased balance, Decreased safety awareness, Difficulty walking, Decreased strength, Decreased mobility, Decreased coordination, Pain, Impaired sensation  Visit Diagnosis: Muscle weakness (generalized)  Other abnormalities of gait and mobility  Unsteadiness on feet     Problem List Patient Active Problem List   Diagnosis Date Noted  . Arthralgia of left hand 07/17/2018  . Debility   . Anemia of chronic disease   . Chronic pain syndrome   . Hypokalemia   . Acute lower UTI 09/04/2017  . L3 vertebral fracture ([place] 09/01/2017  . Constipation due to pain medication   . Muscle spasms of both lower extremities   . Type 2 diabetes mellitus with peripheral neuropathy (  [place])   . Hypoalbuminemia due to protein-calorie malnutrition ([place])   . Radiculopathy 07/28/2017  . Lumbosacral spondylosis with radiculopathy 07/26/2017  . Paraplegia, incomplete ([place]) 01/07/2017  . 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/27/2016  . Myelopathy ([place]) 12/21/2016  . Neuropathic pain   . 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 Paraparesis ([place]) 12/15/2016  . Type 2 diabetes mellitus without  complication, without long-term current use of insulin ([place]) 11/02/2016  . Hyperlipidemia 11/02/2016  . Essential hypertension 11/02/2016  . 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   Zayaan Kozak, PTA  Regena Delucchi A Kori Goins 09/22/2018, 4:48 PM  [facility] [address] [place], [place], [address] Phone: [phone]   Fax:  [phone]  Name: MARILYNN EKSTEIN MRN: [id number] Date of Birth: September 30, 1950

## 2018-09-26 ENCOUNTER — Ambulatory Visit: Payer: PPO

## 2018-09-26 DIAGNOSIS — M6281 Muscle weakness (generalized): Secondary | ICD-10-CM

## 2018-09-26 DIAGNOSIS — R2689 Other abnormalities of gait and mobility: Secondary | ICD-10-CM

## 2018-09-26 DIAGNOSIS — R2681 Unsteadiness on feet: Secondary | ICD-10-CM

## 2018-09-27 NOTE — Therapy (Signed)
[facility] [address] [place], [place], [address] Phone: [phone]   Fax:  [phone]  Physical Therapy Treatment  Patient Details  Name: Megan [name] MRN: [id number] Date of Birth: 09/28/1950 Referring Provider (PT): Dr. Vertell Limber   Encounter Date: 09/26/2018  PT End of Session - 09/27/18 0927    Visit Number  32    Number of Visits  45    Date for PT Re-Evaluation  11/11/18    Authorization Type  Medicare    PT Start Time  1450    PT Stop Time  1530    PT Time Calculation (min)  40 min    Equipment Utilized During Treatment  Gait belt    Activity Tolerance  Patient tolerated treatment well    Behavior During Therapy  [facility] for tasks assessed/performed       Past Medical History:  Diagnosis Date  . Arthritis    "hands, back" (07/26/2017)  . Dyspnea    W/ PHYS CONDITION   . GERD (gastroesophageal reflux disease)   . History of bronchitis    "not since I quit smoking" (07/26/2017)  . History of kidney stones   . History of shingles   . Hyperlipidemia    takes Fish Oil daily  . Hypertension   . Neuromuscular disorder (HCC)    tingling toes  . 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[place]) 12/15/2016  . Pneumonia 2009  . PONV (postoperative nausea and vomiting)   . Restless leg   . Type 2 diabetes mellitus ([place])   . Uterine cancer ([place]) 1979   S/P hysterectomy  . Weakness    numbness and tingling in both feet r/t back    Past Surgical History:  Procedure Laterality Date  . ABDOMINAL EXPOSURE N/A 07/26/2017   Procedure: ABDOMINAL EXPOSURE;  Surgeon: Angelia Mould, MD;  Location: [facility];  Service: Vascular;  Laterality: N/A;  . ANTERIOR CERVICAL DECOMP/DISCECTOMY FUSION  2001  . ANTERIOR LUMBAR FUSION N/A 07/26/2017   Procedure: Lumbar five-Sacral one Anterior lumbar interbody fusion with Dr. Deitra Mayo for approach;  Surgeon: Ditty, Kevan Ny, MD;  Location: [facility];  Service:  Neurosurgery;  Laterality: N/A;  . APPLICATION OF ROBOTIC ASSISTANCE FOR SPINAL PROCEDURE  12/19/2016   Procedure: APPLICATION OF ROBOTIC ASSISTANCE FOR SPINAL PROCEDURE;  Surgeon: Kevan Ny Ditty, MD;  Location: [facility];  Service: Neurosurgery;;  . APPLICATION OF ROBOTIC ASSISTANCE FOR SPINAL PROCEDURE N/A 07/26/2017   Procedure: APPLICATION OF ROBOTIC ASSISTANCE FOR SPINAL PROCEDURE;  Surgeon: Ditty, Kevan Ny, MD;  Location: [facility];  Service: Neurosurgery;  Laterality: N/A;  . APPLICATION OF ROBOTIC ASSISTANCE FOR SPINAL PROCEDURE N/A 09/02/2017   Procedure: APPLICATION OF ROBOTIC ASSISTANCE FOR SPINAL PROCEDURE;  Surgeon: Ditty, Kevan Ny, MD;  Location: [facility];  Service: Neurosurgery;  Laterality: N/A;  . BACK SURGERY    . BLADDER SUSPENSION  1991   tack  . INGUINAL HERNIA REPAIR Right 1991  . LUMBAR FUSION  2016; 2017; 07/26/2017   L4-5; L2-3; L5-S1  . POSTERIOR LUMBAR FUSION 4 WITH HARDWARE REMOVAL N/A 09/02/2017   Procedure: Lumbar three-four redo laminectomy; Repositioning of Left Sacral two screw; Extension of lumbar fusion to Thoracic twelve;  Surgeon: Ditty, Kevan Ny, MD;  Location: [facility];  Service: Neurosurgery;  Laterality: N/A;  . TONSILLECTOMY AND ADENOIDECTOMY  1959  . TUMOR EXCISION     WERTHIN'S TUMORS BOTH SIDES OF NECK  . VAGINAL HYSTERECTOMY  1979    There were no  vitals filed for this visit.  Subjective Assessment - 09/26/18 1452    Subjective  No falls to report, HEP going well, pt has recent onset of LBP.     Patient is accompained by:  Family member    Pertinent History  HTN, DM2, left T10 costotransversectomy, T10-T11 laminectomy for decompression, pedicle screws, T9-T12 (4/18), L5-S1 anterior retroperitoneal exposure, L5-S1 laminectomy, L5-S1 posterior lateral fixation with fusion (11/18); chronic pain    Limitations  Standing;Walking    How long can you walk comfortably?  household distances, limited community ambulation with RW vs rollator     Patient Stated Goals  improve balance and transition to use of cane    Currently in Pain?  Yes    Pain Score  5    10+ at times   Pain Location  Back    Pain Orientation  Lower    Pain Descriptors / Indicators  Aching;Dull;Discomfort    Pain Type  Acute pain    Pain Onset  Yesterday    Pain Frequency  Occasional    Aggravating Factors   When brushing teeth         [facility] Adult PT Treatment/Exercise - 09/27/18 0001      Ambulation/Gait   Ambulation/Gait  Yes    Ambulation/Gait Assistance  4: Min guard;5: Supervision    Ambulation/Gait Assistance Details  Pt C/O minimal pain in low back while ambulating, pain would decrease quickly. pt required one seated rest break due to pain.     Ambulation Distance (Feet)  230 Feet   115   Assistive device  Small based quad cane;Other (Comment)   sweedish knee brace   Gait Pattern  Step-to pattern;Decreased dorsiflexion - right;Decreased step length - right;Right genu recurvatum    Ramp  5: Supervision    Curb  5: Supervision       PT Short Term Goals - 07/05/18 1110      PT SHORT TERM GOAL #1   Title  patient to be independent with initial HEP for strength, balance and gait. (All STGs due 07/04/18)    Baseline  07/05/18: pt is independent with current HEP    Time  --    Period  --    Status  Achieved      PT SHORT TERM GOAL #2   Title  Patient to demonstrate TUG with rollator to </= 16 seconds for reduced fall risk    Baseline  07/05/18: 18.19 sec's with rollator, improved just not to goal    Time  --    Period  --    Status  Partially Met      PT SHORT TERM GOAL #3   Title  Patient to demonstrate 400' gait on level surfaces with rollator at Mod I without LOB or overt instability    Baseline  07/05/18: met today with rollator    Time  --    Period  --    Status  Achieved      PT SHORT TERM GOAL #4   Title  Patient to perform sit to stand x 10 with unilateral UE support with Mod I demonstrating improved strength and efficiency  of transfers    Baseline  07/05/18: performed 10 reps with single UE support on chair, needed rollator for stability upon standing, no physical assist needed.     Time  --    Period  --    Status  Achieved        PT Long Term Goals -  09/11/18 1520      PT LONG TERM GOAL #1   Title  Patient to be independent with final HEP for balance, strength, gait and other functional mobility. (All LTGs due 09/20/18)    Baseline  09/11/18: met with current program    Status  Achieved      PT LONG TERM GOAL #2   Title  patient to demonstrate gait with LRAD over various levels and surfaces for >/= 500' with MOD I without LOB or overt instability    Baseline  09/11/18: met with rollator    Status  Achieved      PT LONG TERM GOAL #3   Title  Patient to improve TUG to </= 13 seconds with LRAD demonstrating reduced fall risk    Baseline  09/11/18:  15.79 sec's with rollator, improved just not to goal    Status  Partially Met      PT LONG TERM GOAL #4   Title  Patient to improve gait velocity with LRAD to at least 4f/sec for decreased fall risk    Baseline  09/11/18: 2.15 ft/sec with rollator (met with this device); 0.56 ft/sec with small base quad cane    Status  Achieved      PT LONG TERM GOAL #5   Title  Paitent to improve R LE strength by >/= 1 manual muscle grade demonstrating improved strength and functional use    Baseline  05/23/18: R LE grossly 3-/5    Status  Deferred            Plan - 09/27/18 0929    Clinical Impression Statement  Todays skilled session focused on gait training with SBQC/sweedish knee cage with some C/O pain in low back. Pt should benefit from continued PT sessions to progress towards goals.     Rehab Potential  Good    PT Frequency  2x / week    PT Duration  Other (comment)    PT Treatment/Interventions  ADLs/Self Care Home Management;Cryotherapy;Electrical Stimulation;DME Instruction;Moist Heat;Gait training;Stair training;Functional mobility  training;Therapeutic activities;Therapeutic exercise;Balance training;Patient/family education;Neuromuscular re-education;Manual techniques;Taping;Passive range of motion;Orthotic Fit/Training    PT Next Visit Plan  continue gait with small base quad cane, ? need for stronger brace when using lesser AD such as cane; continue to work on postural and LE strengthening.     PT Home Exercise Plan  [email or web address]    Consulted and Agree with Plan of Care  Patient       Patient will benefit from skilled therapeutic intervention in order to improve the following deficits and impairments:  Abnormal gait, Decreased activity tolerance, Decreased balance, Decreased safety awareness, Difficulty walking, Decreased strength, Decreased mobility, Decreased coordination, Pain, Impaired sensation  Visit Diagnosis: Muscle weakness (generalized)  Other abnormalities of gait and mobility  Unsteadiness on feet     Problem List Patient Active Problem List   Diagnosis Date Noted  . Arthralgia of left hand 07/17/2018  . Debility   . Anemia of chronic disease   . Chronic pain syndrome   . Hypokalemia   . Acute lower UTI 09/04/2017  . L3 vertebral fracture ([place] 09/01/2017  . Constipation due to pain medication   . Muscle spasms of both lower extremities   . Type 2 diabetes mellitus with peripheral neuropathy (HCC)   . Hypoalbuminemia due to protein-calorie malnutrition ([facility]   . Radiculopathy 07/28/2017  . Lumbosacral spondylosis with radiculopathy 07/26/2017  . Paraplegia, incomplete ([place] 01/07/2017  . 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/27/2016  . Myelopathy ([place] 12/21/2016  .  Neuropathic pain   . 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 Paraparesis ([place]) 12/15/2016  . Type 2 diabetes mellitus without complication, without long-term current use of insulin ([place]) 11/02/2016  . Hyperlipidemia 11/02/2016  . Essential hypertension 11/02/2016  . 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   . 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   Joanthony Hamza, PTA    Annjanette Wertenberger A Khaniya Tenaglia 09/27/2018, 9:30 AM  [facility] [address] [place], [place], [address] Phone: [phone]   Fax:  [phone]  Name: Megan [name] MRN: [id number] Date of Birth: Jan 24, 1951

## 2018-09-29 ENCOUNTER — Ambulatory Visit: Payer: PPO

## 2018-09-29 DIAGNOSIS — M6281 Muscle weakness (generalized): Secondary | ICD-10-CM

## 2018-09-29 DIAGNOSIS — R2689 Other abnormalities of gait and mobility: Secondary | ICD-10-CM

## 2018-09-29 DIAGNOSIS — R2681 Unsteadiness on feet: Secondary | ICD-10-CM

## 2018-09-29 NOTE — Therapy (Signed)
[facility] [address] [place], [place], [address] Phone: [phone]   Fax:  [phone]  Physical Therapy Treatment  Patient Details  Name: Megan [name] MRN: [id number] Date of Birth: Jan 19, 1951 Referring Provider (PT): Dr. Vertell Limber   Encounter Date: 09/29/2018  PT End of Session - 09/29/18 1534    Visit Number  32    Number of Visits  45    Date for PT Re-Evaluation  11/11/18    Authorization Type  Medicare    PT Start Time  1532    PT Stop Time  1615    PT Time Calculation (min)  43 min    Equipment Utilized During Treatment  Gait belt    Activity Tolerance  Patient tolerated treatment well    Behavior During Therapy  [facility] for tasks assessed/performed       Past Medical History:  Diagnosis Date  . Arthritis    "hands, back" (07/26/2017)  . Dyspnea    W/ PHYS CONDITION   . GERD (gastroesophageal reflux disease)   . History of bronchitis    "not since I quit smoking" (07/26/2017)  . History of kidney stones   . History of shingles   . Hyperlipidemia    takes Fish Oil daily  . Hypertension   . Neuromuscular disorder (HCC)    tingling toes  . 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[place]) 12/15/2016  . Pneumonia 2009  . PONV (postoperative nausea and vomiting)   . Restless leg   . Type 2 diabetes mellitus ([place])   . Uterine cancer ([place]) 1979   S/P hysterectomy  . Weakness    numbness and tingling in both feet r/t back    Past Surgical History:  Procedure Laterality Date  . ABDOMINAL EXPOSURE N/A 07/26/2017   Procedure: ABDOMINAL EXPOSURE;  Surgeon: Angelia Mould, MD;  Location: [facility];  Service: Vascular;  Laterality: N/A;  . ANTERIOR CERVICAL DECOMP/DISCECTOMY FUSION  2001  . ANTERIOR LUMBAR FUSION N/A 07/26/2017   Procedure: Lumbar five-Sacral one Anterior lumbar interbody fusion with Dr. Deitra Mayo for approach;  Surgeon: Ditty, Kevan Ny, MD;  Location: [facility];  Service:  Neurosurgery;  Laterality: N/A;  . APPLICATION OF ROBOTIC ASSISTANCE FOR SPINAL PROCEDURE  12/19/2016   Procedure: APPLICATION OF ROBOTIC ASSISTANCE FOR SPINAL PROCEDURE;  Surgeon: Kevan Ny Ditty, MD;  Location: [facility];  Service: Neurosurgery;;  . APPLICATION OF ROBOTIC ASSISTANCE FOR SPINAL PROCEDURE N/A 07/26/2017   Procedure: APPLICATION OF ROBOTIC ASSISTANCE FOR SPINAL PROCEDURE;  Surgeon: Ditty, Kevan Ny, MD;  Location: [facility];  Service: Neurosurgery;  Laterality: N/A;  . APPLICATION OF ROBOTIC ASSISTANCE FOR SPINAL PROCEDURE N/A 09/02/2017   Procedure: APPLICATION OF ROBOTIC ASSISTANCE FOR SPINAL PROCEDURE;  Surgeon: Ditty, Kevan Ny, MD;  Location: [facility];  Service: Neurosurgery;  Laterality: N/A;  . BACK SURGERY    . BLADDER SUSPENSION  1991   tack  . INGUINAL HERNIA REPAIR Right 1991  . LUMBAR FUSION  2016; 2017; 07/26/2017   L4-5; L2-3; L5-S1  . POSTERIOR LUMBAR FUSION 4 WITH HARDWARE REMOVAL N/A 09/02/2017   Procedure: Lumbar three-four redo laminectomy; Repositioning of Left Sacral two screw; Extension of lumbar fusion to Thoracic twelve;  Surgeon: Ditty, Kevan Ny, MD;  Location: [facility];  Service: Neurosurgery;  Laterality: N/A;  . TONSILLECTOMY AND ADENOIDECTOMY  1959  . TUMOR EXCISION     WERTHIN'S TUMORS BOTH SIDES OF NECK  . VAGINAL HYSTERECTOMY  1979    There were no  vitals filed for this visit.  Subjective Assessment - 09/29/18 1532    Subjective  No falls to report, HEP going well. LBP has gotten better.     Patient is accompained by:  Family member    Pertinent History  HTN, DM2, left T10 costotransversectomy, T10-T11 laminectomy for decompression, pedicle screws, T9-T12 (4/18), L5-S1 anterior retroperitoneal exposure, L5-S1 laminectomy, L5-S1 posterior lateral fixation with fusion (11/18); chronic pain    Limitations  Standing;Walking    How long can you walk comfortably?  household distances, limited community ambulation with RW vs rollator     Patient Stated Goals  improve balance and transition to use of cane    Currently in Pain?  No/denies        [facility] Adult PT Treatment/Exercise - 09/29/18 1608      Ambulation/Gait   Ambulation/Gait  Yes    Ambulation/Gait Assistance  5: Supervision;4: Min guard    Ambulation/Gait Assistance Details  no C/O back pain with ambulation.     Ambulation Distance (Feet)  115 Feet    Assistive device  Small based quad cane;Other (Comment)   sweedish knee cage   Gait Pattern  Step-to pattern;Decreased dorsiflexion - right;Decreased step length - right;Right genu recurvatum    Ambulation Surface  Level;Indoor      High Level Balance   High Level Balance Activities  Tandem walking;Side stepping   on blue foam beam   High Level Balance Comments  in parallel bars on blue foam beam performing toe tap to cones with increase in hip/knee flexion with brace doffed, progressing from BUE to no UE support required with min assist for stability.       Ankle Exercises: Aerobic   Nustep  L5 33mns        PT Short Term Goals - 07/05/18 1110      PT SHORT TERM GOAL #1   Title  patient to be independent with initial HEP for strength, balance and gait. (All STGs due 07/04/18)    Baseline  07/05/18: pt is independent with current HEP    Time  --    Period  --    Status  Achieved      PT SHORT TERM GOAL #2   Title  Patient to demonstrate TUG with rollator to </= 16 seconds for reduced fall risk    Baseline  07/05/18: 18.19 sec's with rollator, improved just not to goal    Time  --    Period  --    Status  Partially Met      PT SHORT TERM GOAL #3   Title  Patient to demonstrate 400' gait on level surfaces with rollator at Mod I without LOB or overt instability    Baseline  07/05/18: met today with rollator    Time  --    Period  --    Status  Achieved      PT SHORT TERM GOAL #4   Title  Patient to perform sit to stand x 10 with unilateral UE support with Mod I demonstrating improved strength and  efficiency of transfers    Baseline  07/05/18: performed 10 reps with single UE support on chair, needed rollator for stability upon standing, no physical assist needed.     Time  --    Period  --    Status  Achieved        PT Long Term Goals - 09/11/18 1520      PT LONG TERM GOAL #1  Title  Patient to be independent with final HEP for balance, strength, gait and other functional mobility. (All LTGs due 09/20/18)    Baseline  09/11/18: met with current program    Status  Achieved      PT LONG TERM GOAL #2   Title  patient to demonstrate gait with LRAD over various levels and surfaces for >/= 500' with MOD I without LOB or overt instability    Baseline  09/11/18: met with rollator    Status  Achieved      PT LONG TERM GOAL #3   Title  Patient to improve TUG to </= 13 seconds with LRAD demonstrating reduced fall risk    Baseline  09/11/18:  15.79 sec's with rollator, improved just not to goal    Status  Partially Met      PT LONG TERM GOAL #4   Title  Patient to improve gait velocity with LRAD to at least 5f/sec for decreased fall risk    Baseline  09/11/18: 2.15 ft/sec with rollator (met with this device); 0.56 ft/sec with small base quad cane    Status  Achieved      PT LONG TERM GOAL #5   Title  Paitent to improve R LE strength by >/= 1 manual muscle grade demonstrating improved strength and functional use    Baseline  05/23/18: R LE grossly 3-/5    Status  Deferred        Plan - 09/29/18 1624    Clinical Impression Statement  Todays skilled session focused on gait training with SBQC/sweedish knee cage, BLE strengthening, and High level balance on blue foam beam with one episode of knee buckling. Pt should benefit from continued PT sessions to progress towards goals.     Rehab Potential  Good    PT Frequency  2x / week    PT Duration  Other (comment)    PT Treatment/Interventions  ADLs/Self Care Home Management;Cryotherapy;Electrical Stimulation;DME Instruction;Moist  Heat;Gait training;Stair training;Functional mobility training;Therapeutic activities;Therapeutic exercise;Balance training;Patient/family education;Neuromuscular re-education;Manual techniques;Taping;Passive range of motion;Orthotic Fit/Training    PT Next Visit Plan  continue gait with small base quad cane, ? need for stronger brace when using lesser AD such as cane; continue to work on postural and LE strengthening.     PT Home Exercise Plan  [id number]    Consulted and Agree with Plan of Care  Patient       Patient will benefit from skilled therapeutic intervention in order to improve the following deficits and impairments:  Abnormal gait, Decreased activity tolerance, Decreased balance, Decreased safety awareness, Difficulty walking, Decreased strength, Decreased mobility, Decreased coordination, Pain, Impaired sensation  Visit Diagnosis: Muscle weakness (generalized)  Other abnormalities of gait and mobility  Unsteadiness on feet     Problem List Patient Active Problem List   Diagnosis Date Noted  . Arthralgia of left hand 07/17/2018  . Debility   . Anemia of chronic disease   . Chronic pain syndrome   . Hypokalemia   . Acute lower UTI 09/04/2017  . L3 vertebral fracture ([place] 09/01/2017  . Constipation due to pain medication   . Muscle spasms of both lower extremities   . Type 2 diabetes mellitus with peripheral neuropathy (HCC)   . Hypoalbuminemia due to protein-calorie malnutrition ([place]   . Radiculopathy 07/28/2017  . Lumbosacral spondylosis with radiculopathy 07/26/2017  . Paraplegia, incomplete ([place] 01/07/2017  . 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/27/2016  . Myelopathy ([facility] 12/21/2016  . Neuropathic pain   . Spondylogenic compression of  thoracic spinal cord 12/18/2016  . Paraparesis ([place]) 12/15/2016  . Type 2 diabetes mellitus without complication, without long-term current use of insulin ([place]) 11/02/2016  . Hyperlipidemia 11/02/2016  . Essential hypertension 11/02/2016  .  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   Breeana Sawtelle, PTA  Gibril Mastro A Annahi Short 09/29/2018, 4:26 PM  [facility] [address] [place], [place], [address] Phone: [phone]   Fax:  [phone]  Name: NICEY KRAH MRN: [id number] Date of Birth: 18-Oct-1950

## 2018-10-03 ENCOUNTER — Ambulatory Visit: Payer: PPO | Admitting: Physical Therapy

## 2018-10-03 DIAGNOSIS — M6281 Muscle weakness (generalized): Secondary | ICD-10-CM

## 2018-10-03 DIAGNOSIS — R2681 Unsteadiness on feet: Secondary | ICD-10-CM

## 2018-10-03 DIAGNOSIS — R2689 Other abnormalities of gait and mobility: Secondary | ICD-10-CM

## 2018-10-04 NOTE — Therapy (Signed)
[facility] [address] [place], [place], [address] Phone: [phone]   Fax:  [phone]  Physical Therapy Treatment  Patient Details  Name: Megan [name] MRN: [id number] Date of Birth: Nov 16, 1950 Referring Provider (PT): Dr. Vertell Limber   Encounter Date: 10/03/2018  PT End of Session - 10/04/18 2059    Visit Number  33    Number of Visits  38    Date for PT Re-Evaluation  10/19/18    Authorization Type  Medicare    PT Start Time  2876    PT Stop Time  1446    PT Time Calculation (min)  44 min    Equipment Utilized During Treatment  Gait belt       Past Medical History:  Diagnosis Date  . Arthritis    "hands, back" (07/26/2017)  . Dyspnea    W/ PHYS CONDITION   . GERD (gastroesophageal reflux disease)   . History of bronchitis    "not since I quit smoking" (07/26/2017)  . History of kidney stones   . History of shingles   . Hyperlipidemia    takes Fish Oil daily  . Hypertension   . Neuromuscular disorder (HCC)    tingling toes  . 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[place] [facility]) 12/15/2016  . Pneumonia 2009  . PONV (postoperative nausea and vomiting)   . Restless leg   . Type 2 diabetes mellitus ([place])   . Uterine cancer ([place]) 1979   S/P hysterectomy  . Weakness    numbness and tingling in both feet r/t back    Past Surgical History:  Procedure Laterality Date  . ABDOMINAL EXPOSURE N/A 07/26/2017   Procedure: ABDOMINAL EXPOSURE;  Surgeon: Angelia Mould, MD;  Location: [facility];  Service: Vascular;  Laterality: N/A;  . ANTERIOR CERVICAL DECOMP/DISCECTOMY FUSION  2001  . ANTERIOR LUMBAR FUSION N/A 07/26/2017   Procedure: Lumbar five-Sacral one Anterior lumbar interbody fusion with Dr. Deitra Mayo for approach;  Surgeon: Ditty, Kevan Ny, MD;  Location: [facility];  Service: Neurosurgery;  Laterality: N/A;  . APPLICATION OF ROBOTIC ASSISTANCE FOR SPINAL PROCEDURE  12/19/2016   Procedure: APPLICATION OF  ROBOTIC ASSISTANCE FOR SPINAL PROCEDURE;  Surgeon: Kevan Ny Ditty, MD;  Location: [facility];  Service: Neurosurgery;;  . APPLICATION OF ROBOTIC ASSISTANCE FOR SPINAL PROCEDURE N/A 07/26/2017   Procedure: APPLICATION OF ROBOTIC ASSISTANCE FOR SPINAL PROCEDURE;  Surgeon: Ditty, Kevan Ny, MD;  Location: [facility];  Service: Neurosurgery;  Laterality: N/A;  . APPLICATION OF ROBOTIC ASSISTANCE FOR SPINAL PROCEDURE N/A 09/02/2017   Procedure: APPLICATION OF ROBOTIC ASSISTANCE FOR SPINAL PROCEDURE;  Surgeon: Ditty, Kevan Ny, MD;  Location: [facility];  Service: Neurosurgery;  Laterality: N/A;  . BACK SURGERY    . BLADDER SUSPENSION  1991   tack  . INGUINAL HERNIA REPAIR Right 1991  . LUMBAR FUSION  2016; 2017; 07/26/2017   L4-5; L2-3; L5-S1  . POSTERIOR LUMBAR FUSION 4 WITH HARDWARE REMOVAL N/A 09/02/2017   Procedure: Lumbar three-four redo laminectomy; Repositioning of Left Sacral two screw; Extension of lumbar fusion to Thoracic twelve;  Surgeon: Ditty, Kevan Ny, MD;  Location: [facility];  Service: Neurosurgery;  Laterality: N/A;  . TONSILLECTOMY AND ADENOIDECTOMY  1959  . TUMOR EXCISION     WERTHIN'S TUMORS BOTH SIDES OF NECK  . VAGINAL HYSTERECTOMY  1979    There were no vitals filed for this visit.  Subjective Assessment - 10/04/18 2050    Subjective  Pt reports no changes in  status - no falls; pt states she tightened her Swedish knee cage because it was very loose     Pertinent History  HTN, DM2, left T10 costotransversectomy, T10-T11 laminectomy for decompression, pedicle screws, T9-T12 (4/18), L5-S1 anterior retroperitoneal exposure, L5-S1 laminectomy, L5-S1 posterior lateral fixation with fusion (11/18); chronic pain    Limitations  Standing;Walking    How long can you walk comfortably?  household distances, limited community ambulation with RW vs rollator    Patient Stated Goals  improve balance and transition to use of cane    Currently in Pain?  No/denies                        [facility] Adult PT Treatment/Exercise - 10/04/18 0001      Ambulation/Gait   Ambulation/Gait  Yes    Ambulation/Gait Assistance  4: Min guard    Ambulation Distance (Feet)  115 Feet    Assistive device  Small based quad cane;Other (Comment)   sweedish knee cage   Gait Pattern  Step-to pattern;Decreased dorsiflexion - right;Decreased step length - right;Right genu recurvatum    Ambulation Surface  Level;Indoor    Stairs  Yes    Stairs Assistance  4: Min guard    Stair Management Technique  Two rails;Step to pattern;Forwards    Number of Stairs  4    Height of Stairs  6      High Level Balance   High Level Balance Activities  Side stepping;Backward walking;Negotiating over obstacles   inside // bars     Knee/Hip Exercises: Standing   Forward Step Up  10 reps;Right;1 set;Hand Hold: 2;Step Height: 6"    Step Down  Right;Hand Hold: 2;Step Height: 2";10 reps               PT Short Term Goals - 07/05/18 1110      PT SHORT TERM GOAL #1   Title  patient to be independent with initial HEP for strength, balance and gait. (All STGs due 07/04/18)    Baseline  07/05/18: pt is independent with current HEP    Time  --    Period  --    Status  Achieved      PT SHORT TERM GOAL #2   Title  Patient to demonstrate TUG with rollator to </= 16 seconds for reduced fall risk    Baseline  07/05/18: 18.19 sec's with rollator, improved just not to goal    Time  --    Period  --    Status  Partially Met      PT SHORT TERM GOAL #3   Title  Patient to demonstrate 400' gait on level surfaces with rollator at Mod I without LOB or overt instability    Baseline  07/05/18: met today with rollator    Time  --    Period  --    Status  Achieved      PT SHORT TERM GOAL #4   Title  Patient to perform sit to stand x 10 with unilateral UE support with Mod I demonstrating improved strength and efficiency of transfers    Baseline  07/05/18: performed 10 reps with single  UE support on chair, needed rollator for stability upon standing, no physical assist needed.     Time  --    Period  --    Status  Achieved        PT Long Term Goals - 10/03/18 1407  PT LONG TERM GOAL #1   Title  Patient to be independent with final HEP for balance, strength, gait and other functional mobility. (All LTGs due 09/20/18)      PT LONG TERM GOAL #3   Title  Patient to improve TUG to </= 13 seconds with LRAD demonstrating reduced fall risk    Baseline  09/11/18:  15.79 sec's with rollator, improved just not to goal; 15.16 secs on 10-03-18    Status  Partially Met    Target Date  10/19/18      PT LONG TERM GOAL #4   Title  Patient to improve gait velocity with LRAD to at least 61f/sec for decreased fall risk    Baseline  09/11/18: 2.15 ft/sec with rollator (met with this device); 0.56 ft/sec with small base quad cane;  12.50 secs with rollator = 2.6 ft/sec      PT LONG TERM GOAL #5   Title  Paitent to improve R LE strength by >/= 1 manual muscle grade demonstrating improved strength and functional use    Status  Deferred            Plan - 10/04/18 2103    Clinical Impression Statement  Pt's TUG score has increased minimally from 15.79 secs to 15.16 secs with use of rollator;  pt continues to have Rt knee hyperextension with use of Swedish knee cage and decreased ankle control with pt not wearing ankle orthosis on RLE.  Pt states she may request early D/C from PT due to financial concerns.  Pt appears to have plateaued in maximizing functional progress at this time.    Rehab Potential  Good    PT Frequency  2x / week    PT Duration  Other (comment)    PT Treatment/Interventions  ADLs/Self Care Home Management;Cryotherapy;Electrical Stimulation;DME Instruction;Moist Heat;Gait training;Stair training;Functional mobility training;Therapeutic activities;Therapeutic exercise;Balance training;Patient/family education;Neuromuscular re-education;Manual  techniques;Taping;Passive range of motion;Orthotic Fit/Training    PT Next Visit Plan  Pt may D/C early from PT due to financial concern with deductible for this yr not met;  continue gait with small base quad cane, ? need for stronger brace when using lesser AD such as cane; continue to work on postural and LE strengthening.     Consulted and Agree with Plan of Care  Patient       Patient will benefit from skilled therapeutic intervention in order to improve the following deficits and impairments:  Abnormal gait, Decreased activity tolerance, Decreased balance, Decreased safety awareness, Difficulty walking, Decreased strength, Decreased mobility, Decreased coordination, Pain, Impaired sensation  Visit Diagnosis: Muscle weakness (generalized) - Plan: PT plan of care cert/re-cert  Other abnormalities of gait and mobility - Plan: PT plan of care cert/re-cert  Unsteadiness on feet - Plan: PT plan of care cert/re-cert     Problem List Patient Active Problem List   Diagnosis Date Noted  . Arthralgia of left hand 07/17/2018  . Debility   . Anemia of chronic disease   . Chronic pain syndrome   . Hypokalemia   . Acute lower UTI 09/04/2017  . L3 vertebral fracture ([facility] 09/01/2017  . Constipation due to pain medication   . Muscle spasms of both lower extremities   . Type 2 diabetes mellitus with peripheral neuropathy (HCC)   . Hypoalbuminemia due to protein-calorie malnutrition ([place] [place]   . Radiculopathy 07/28/2017  . Lumbosacral spondylosis with radiculopathy 07/26/2017  . Paraplegia, incomplete ([place] [facility] 01/07/2017  . 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/27/2016  . Myelopathy ([facility] [place] 12/21/2016  . Neuropathic  pain   . 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 Paraparesis ([place]) 12/15/2016  . Type 2 diabetes mellitus without complication, without long-term current use of insulin ([place]) 11/02/2016  . Hyperlipidemia 11/02/2016  . Essential hypertension 11/02/2016  . Congenital  spondylolisthesis of lumbar region 04/27/2016  . 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    Alda Lea, PT 10/04/2018, 9:13 PM  [facility] [address] [place], [place], [address] Phone: [phone]   Fax:  [phone]  Name: Megan [name] MRN: [id number] Date of Birth: 09-03-1951

## 2018-10-05 NOTE — Addendum Note (Signed)
Addended by: Lamar Benes on: 10/05/2018 12:52 PM   Modules accepted: Orders

## 2018-10-06 ENCOUNTER — Ambulatory Visit: Payer: PPO

## 2018-10-06 DIAGNOSIS — R2689 Other abnormalities of gait and mobility: Secondary | ICD-10-CM

## 2018-10-06 DIAGNOSIS — R2681 Unsteadiness on feet: Secondary | ICD-10-CM

## 2018-10-06 DIAGNOSIS — M6281 Muscle weakness (generalized): Secondary | ICD-10-CM

## 2018-10-06 NOTE — Therapy (Signed)
[facility] [address] [place], [place], [address] Phone: [phone]   Fax:  [phone]  Physical Therapy Treatment  Patient Details  Name: Megan [name] MRN: [id number] Date of Birth: Apr 15, 1951 Referring Provider (PT): Dr. Vertell Limber   Encounter Date: 10/06/2018  PT End of Session - 10/06/18 1541    Visit Number  34    Number of Visits  38    Date for PT Re-Evaluation  10/19/18    Authorization Type  Medicare    PT Start Time  1530    PT Stop Time  1615    PT Time Calculation (min)  45 min    Equipment Utilized During Treatment  Gait belt    Activity Tolerance  Patient tolerated treatment well    Behavior During Therapy  [facility] for tasks assessed/performed       Past Medical History:  Diagnosis Date  . Arthritis    "hands, back" (07/26/2017)  . Dyspnea    W/ PHYS CONDITION   . GERD (gastroesophageal reflux disease)   . History of bronchitis    "not since I quit smoking" (07/26/2017)  . History of kidney stones   . History of shingles   . Hyperlipidemia    takes Fish Oil daily  . Hypertension   . Neuromuscular disorder (HCC)    tingling toes  . 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[place]) 12/15/2016  . Pneumonia 2009  . PONV (postoperative nausea and vomiting)   . Restless leg   . Type 2 diabetes mellitus ([place])   . Uterine cancer ([place]) 1979   S/P hysterectomy  . Weakness    numbness and tingling in both feet r/t back    Past Surgical History:  Procedure Laterality Date  . ABDOMINAL EXPOSURE N/A 07/26/2017   Procedure: ABDOMINAL EXPOSURE;  Surgeon: Angelia Mould, MD;  Location: [place];  Service: Vascular;  Laterality: N/A;  . ANTERIOR CERVICAL DECOMP/DISCECTOMY FUSION  2001  . ANTERIOR LUMBAR FUSION N/A 07/26/2017   Procedure: Lumbar five-Sacral one Anterior lumbar interbody fusion with Dr. Deitra Mayo for approach;  Surgeon: Ditty, Kevan Ny, MD;  Location: [place];  Service:  Neurosurgery;  Laterality: N/A;  . APPLICATION OF ROBOTIC ASSISTANCE FOR SPINAL PROCEDURE  12/19/2016   Procedure: APPLICATION OF ROBOTIC ASSISTANCE FOR SPINAL PROCEDURE;  Surgeon: Kevan Ny Ditty, MD;  Location: [place];  Service: Neurosurgery;;  . APPLICATION OF ROBOTIC ASSISTANCE FOR SPINAL PROCEDURE N/A 07/26/2017   Procedure: APPLICATION OF ROBOTIC ASSISTANCE FOR SPINAL PROCEDURE;  Surgeon: Ditty, Kevan Ny, MD;  Location: [place];  Service: Neurosurgery;  Laterality: N/A;  . APPLICATION OF ROBOTIC ASSISTANCE FOR SPINAL PROCEDURE N/A 09/02/2017   Procedure: APPLICATION OF ROBOTIC ASSISTANCE FOR SPINAL PROCEDURE;  Surgeon: Ditty, Kevan Ny, MD;  Location: [place];  Service: Neurosurgery;  Laterality: N/A;  . BACK SURGERY    . BLADDER SUSPENSION  1991   tack  . INGUINAL HERNIA REPAIR Right 1991  . LUMBAR FUSION  2016; 2017; 07/26/2017   L4-5; L2-3; L5-S1  . POSTERIOR LUMBAR FUSION 4 WITH HARDWARE REMOVAL N/A 09/02/2017   Procedure: Lumbar three-four redo laminectomy; Repositioning of Left Sacral two screw; Extension of lumbar fusion to Thoracic twelve;  Surgeon: Ditty, Kevan Ny, MD;  Location: [place];  Service: Neurosurgery;  Laterality: N/A;  . TONSILLECTOMY AND ADENOIDECTOMY  1959  . TUMOR EXCISION     WERTHIN'S TUMORS BOTH SIDES OF NECK  . VAGINAL HYSTERECTOMY  1979    There were no  vitals filed for this visit.  Subjective Assessment - 10/06/18 1538    Subjective  No falls to report, HEP is going well. Pt requested to continue PT sessions with understanding of dedudictable/copay amount.     Patient is accompained by:  Family member    Pertinent History  HTN, DM2, left T10 costotransversectomy, T10-T11 laminectomy for decompression, pedicle screws, T9-T12 (4/18), L5-S1 anterior retroperitoneal exposure, L5-S1 laminectomy, L5-S1 posterior lateral fixation with fusion (11/18); chronic pain    Limitations  Standing;Walking    How long can you walk comfortably?  household  distances, limited community ambulation with RW vs rollator    Patient Stated Goals  improve balance and transition to use of cane    Currently in Pain?  No/denies        [facility] Adult PT Treatment/Exercise - 10/06/18 1545      Ambulation/Gait   Ambulation/Gait  Yes    Ambulation/Gait Assistance  4: Min guard    Ambulation/Gait Assistance Details  Pt presented with LLE knee buckle this session & the previous session with therapist.  Pt fatigued quickly during second lap while working on gait speed.     Ambulation Distance (Feet)  115 Feet   115   Assistive device  Small based quad cane;Other (Comment)   R Sweedish knee brace   Gait Pattern  Step-to pattern;Decreased dorsiflexion - right;Decreased step length - right;Right genu recurvatum    Ambulation Surface  Level;Indoor      Neuro Re-ed    Neuro Re-ed Details   Pt in parallel bars standing on blue foam beam while throwing/catching ball with minimal instability.          PT Short Term Goals - 07/05/18 1110      PT SHORT TERM GOAL #1   Title  patient to be independent with initial HEP for strength, balance and gait. (All STGs due 07/04/18)    Baseline  07/05/18: pt is independent with current HEP    Time  --    Period  --    Status  Achieved      PT SHORT TERM GOAL #2   Title  Patient to demonstrate TUG with rollator to </= 16 seconds for reduced fall risk    Baseline  07/05/18: 18.19 sec's with rollator, improved just not to goal    Time  --    Period  --    Status  Partially Met      PT SHORT TERM GOAL #3   Title  Patient to demonstrate 400' gait on level surfaces with rollator at Mod I without LOB or overt instability    Baseline  07/05/18: met today with rollator    Time  --    Period  --    Status  Achieved      PT SHORT TERM GOAL #4   Title  Patient to perform sit to stand x 10 with unilateral UE support with Mod I demonstrating improved strength and efficiency of transfers    Baseline  07/05/18: performed 10  reps with single UE support on chair, needed rollator for stability upon standing, no physical assist needed.     Time  --    Period  --    Status  Achieved        PT Long Term Goals - 10/03/18 1407      PT LONG TERM GOAL #1   Title  Patient to be independent with final HEP for balance, strength, gait and other functional mobility. (  All LTGs due 09/20/18)      PT LONG TERM GOAL #3   Title  Patient to improve TUG to </= 13 seconds with LRAD demonstrating reduced fall risk    Baseline  09/11/18:  15.79 sec's with rollator, improved just not to goal; 15.16 secs on 10-03-18    Status  Partially Met    Target Date  10/19/18      PT LONG TERM GOAL #4   Title  Patient to improve gait velocity with LRAD to at least 62f/sec for decreased fall risk    Baseline  09/11/18: 2.15 ft/sec with rollator (met with this device); 0.56 ft/sec with small base quad cane;  12.50 secs with rollator = 2.6 ft/sec      PT LONG TERM GOAL #5   Title  Paitent to improve R LE strength by >/= 1 manual muscle grade demonstrating improved strength and functional use    Status  Deferred            Plan - 10/06/18 1640    Clinical Impression Statement  Todays skilled session focused on gait training with SBQC/sweedish knee brace with unusual LLE knee buckle, and high level balance with min/mod instability. Pt should benefit from continued PT sessions to progress towards goals.     Rehab Potential  Good    PT Frequency  2x / week    PT Duration  Other (comment)    PT Treatment/Interventions  ADLs/Self Care Home Management;Cryotherapy;Electrical Stimulation;DME Instruction;Moist Heat;Gait training;Stair training;Functional mobility training;Therapeutic activities;Therapeutic exercise;Balance training;Patient/family education;Neuromuscular re-education;Manual techniques;Taping;Passive range of motion;Orthotic Fit/Training    PT Next Visit Plan  continue gait with small base quad cane, ? need for stronger brace when  using lesser AD such as cane; continue to work on postural and LE strengthening.     Consulted and Agree with Plan of Care  Patient       Patient will benefit from skilled therapeutic intervention in order to improve the following deficits and impairments:  Abnormal gait, Decreased activity tolerance, Decreased balance, Decreased safety awareness, Difficulty walking, Decreased strength, Decreased mobility, Decreased coordination, Pain, Impaired sensation  Visit Diagnosis: Muscle weakness (generalized)  Other abnormalities of gait and mobility  Unsteadiness on feet     Problem List Patient Active Problem List   Diagnosis Date Noted  . Arthralgia of left hand 07/17/2018  . Debility   . Anemia of chronic disease   . Chronic pain syndrome   . Hypokalemia   . Acute lower UTI 09/04/2017  . L3 vertebral fracture ([place] 09/01/2017  . Constipation due to pain medication   . Muscle spasms of both lower extremities   . Type 2 diabetes mellitus with peripheral neuropathy (HCC)   . Hypoalbuminemia due to protein-calorie malnutrition ([place] [place]   . Radiculopathy 07/28/2017  . Lumbosacral spondylosis with radiculopathy 07/26/2017  . Paraplegia, incomplete ([place] 01/07/2017  . 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/27/2016  . Myelopathy ([place] 12/21/2016  . Neuropathic pain   . 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 Paraparesis ([place] 12/15/2016  . Type 2 diabetes mellitus without complication, without long-term current use of insulin ([place] 11/02/2016  . Hyperlipidemia 11/02/2016  . Essential hypertension 11/02/2016  . 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    , PTA   A  10/06/2018, 4:42 PM  [place] [address]Phone: [phone]  Fax:  [phone] Name: Megan GADSBYMRN: [id number]  Date of  Birth: 15-Nov-1950

## 2018-10-09 ENCOUNTER — Other Ambulatory Visit: Payer: Self-pay | Admitting: Primary Care

## 2018-10-09 DIAGNOSIS — E785 Hyperlipidemia, unspecified: Secondary | ICD-10-CM

## 2018-10-09 MED ORDER — ATORVASTATIN CALCIUM 20 MG PO TABS: 20.0000 mg | ORAL_TABLET | Freq: Every evening | ORAL | 2 refills | Status: DC

## 2018-10-09 NOTE — Telephone Encounter (Signed)
Noted, refill sent to pharmacy. 

## 2018-10-09 NOTE — Telephone Encounter (Signed)
Have not been prescribed by Allie Bossier. Looks like last prescribed from the [facility] hospital on 09/16/2017 #90 with 3 refills. Last seen on 07/17/2018

## 2018-10-09 NOTE — Telephone Encounter (Signed)
Best number  [phone] [facility] [email or web address]  Pt called to get refills on meds she was prescribed in hospital  Atorvastatin  20mg   Take once daily  90 supply  Pt will be out of meds on wednesday

## 2018-10-10 ENCOUNTER — Ambulatory Visit: Payer: PPO | Admitting: Physical Therapy

## 2018-10-10 DIAGNOSIS — M6281 Muscle weakness (generalized): Secondary | ICD-10-CM

## 2018-10-10 DIAGNOSIS — R2689 Other abnormalities of gait and mobility: Secondary | ICD-10-CM

## 2018-10-11 ENCOUNTER — Encounter: Payer: Self-pay | Admitting: Physical Therapy

## 2018-10-11 NOTE — Therapy (Signed)
[facility] [address] [place], [place], [address] Phone: [phone]   Fax:  [phone]  Physical Therapy Treatment  Patient Details  Name: MANAMI TUTOR MRN: [id number] Date of Birth: 1950-12-19 Referring Provider (PT): Dr. Vertell Limber   Encounter Date: 10/10/2018  PT End of Session - 10/11/18 0955    Visit Number  35    Number of Visits  38    Authorization Type  Medicare    PT Start Time  4008    PT Stop Time  1400    PT Time Calculation (min)  45 min    Equipment Utilized During Treatment  Gait belt       Past Medical History:  Diagnosis Date  . Arthritis    "hands, back" (07/26/2017)  . Dyspnea    W/ PHYS CONDITION   . GERD (gastroesophageal reflux disease)   . History of bronchitis    "not since I quit smoking" (07/26/2017)  . History of kidney stones   . History of shingles   . Hyperlipidemia    takes Fish Oil daily  . Hypertension   . Neuromuscular disorder (HCC)    tingling toes  . 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[place]) 12/15/2016  . Pneumonia 2009  . PONV (postoperative nausea and vomiting)   . Restless leg   . Type 2 diabetes mellitus ([place])   . Uterine cancer ([place]) 1979   S/P hysterectomy  . Weakness    numbness and tingling in both feet r/t back    Past Surgical History:  Procedure Laterality Date  . ABDOMINAL EXPOSURE N/A 07/26/2017   Procedure: ABDOMINAL EXPOSURE;  Surgeon: Angelia Mould, MD;  Location: [place];  Service: Vascular;  Laterality: N/A;  . ANTERIOR CERVICAL DECOMP/DISCECTOMY FUSION  2001  . ANTERIOR LUMBAR FUSION N/A 07/26/2017   Procedure: Lumbar five-Sacral one Anterior lumbar interbody fusion with Dr. Deitra Mayo for approach;  Surgeon: Ditty, Kevan Ny, MD;  Location: [place];  Service: Neurosurgery;  Laterality: N/A;  . APPLICATION OF ROBOTIC ASSISTANCE FOR SPINAL PROCEDURE  12/19/2016   Procedure: APPLICATION OF ROBOTIC ASSISTANCE FOR SPINAL  PROCEDURE;  Surgeon: Kevan Ny Ditty, MD;  Location: [place];  Service: Neurosurgery;;  . APPLICATION OF ROBOTIC ASSISTANCE FOR SPINAL PROCEDURE N/A 07/26/2017   Procedure: APPLICATION OF ROBOTIC ASSISTANCE FOR SPINAL PROCEDURE;  Surgeon: Ditty, Kevan Ny, MD;  Location: [place];  Service: Neurosurgery;  Laterality: N/A;  . APPLICATION OF ROBOTIC ASSISTANCE FOR SPINAL PROCEDURE N/A 09/02/2017   Procedure: APPLICATION OF ROBOTIC ASSISTANCE FOR SPINAL PROCEDURE;  Surgeon: Ditty, Kevan Ny, MD;  Location: [place];  Service: Neurosurgery;  Laterality: N/A;  . BACK SURGERY    . BLADDER SUSPENSION  1991   tack  . INGUINAL HERNIA REPAIR Right 1991  . LUMBAR FUSION  2016; 2017; 07/26/2017   L4-5; L2-3; L5-S1  . POSTERIOR LUMBAR FUSION 4 WITH HARDWARE REMOVAL N/A 09/02/2017   Procedure: Lumbar three-four redo laminectomy; Repositioning of Left Sacral two screw; Extension of lumbar fusion to Thoracic twelve;  Surgeon: Ditty, Kevan Ny, MD;  Location: [place];  Service: Neurosurgery;  Laterality: N/A;  . TONSILLECTOMY AND ADENOIDECTOMY  1959  . TUMOR EXCISION     WERTHIN'S TUMORS BOTH SIDES OF NECK  . VAGINAL HYSTERECTOMY  1979    There were no vitals filed for this visit.  Subjective Assessment - 10/11/18 0948    Subjective  Pt states she did not sleep well last night - is not doing quite  as well due to lack of sleep; pt says she forgot her knee brace (Swedish knee cage) today    Pertinent History  HTN, DM2, left T10 costotransversectomy, T10-T11 laminectomy for decompression, pedicle screws, T9-T12 (4/18), L5-S1 anterior retroperitoneal exposure, L5-S1 laminectomy, L5-S1 posterior lateral fixation with fusion (11/18); chronic pain    Patient Stated Goals  improve balance and transition to use of cane    Currently in Pain?  Yes    Pain Score  4     Pain Location  Back    Pain Orientation  Mid   in spine area   Pain Descriptors / Indicators  Aching;Dull;Discomfort    Pain Type   Chronic pain    Pain Onset  Yesterday    Pain Frequency  Intermittent                       [facility] Adult PT Treatment/Exercise - 10/11/18 0001      Ambulation/Gait   Ambulation/Gait  Yes    Ambulation/Gait Assistance  4: Min guard    Ambulation/Gait Assistance Details  clinic's Swedish knee cage used due to pt forgetting hers     Engineer, civil (consulting) (Feet)  230 Feet    Assistive device  Small based quad cane;Other (Comment)   R Sweedish knee brace   Gait Pattern  Step-to pattern;Decreased dorsiflexion - right;Decreased step length - right;Right genu recurvatum    Ambulation Surface  Level;Indoor      Exercises   Exercises  Ankle      Knee/Hip Exercises: Aerobic   Nustep  Level 4 x 5" with UE & LE's      Knee/Hip Exercises: Machines for Strengthening   Cybex Leg Press  bil. LE's 40# 15 reps:  RLE only 25# 15 reps      Knee/Hip Exercises: Standing   Forward Step Up  10 reps;Right;1 set;Hand Hold: 2;Step Height: 6"    Step Down  Right;Left;1 set;10 reps;Hand Hold: 2;Step Height: 4"   performed inside // bars for incr. stability and support     Knee/Hip Exercises: Seated   Hamstring Curl  Strengthening;Right;1 set;10 reps   yellow theraband used     Ankle Exercises: Seated   Other Seated Ankle Exercises  pt performed seated Rt ankle dorsiflexion and eversion (assisted due to lack of full AROM with eversion) with yellow theraband for rsistance 10 reps each exercise                PT Short Term Goals - 07/05/18 1110      PT SHORT TERM GOAL #1   Title  patient to be independent with initial HEP for strength, balance and gait. (All STGs due 07/04/18)    Baseline  07/05/18: pt is independent with current HEP    Time  --    Period  --    Status  Achieved      PT SHORT TERM GOAL #2   Title  Patient to demonstrate TUG with rollator to </= 16 seconds for reduced fall risk    Baseline  07/05/18: 18.19 sec's with rollator, improved just not to goal    Time   --    Period  --    Status  Partially Met      PT SHORT TERM GOAL #3   Title  Patient to demonstrate 400' gait on level surfaces with rollator at Mod I without LOB or overt instability    Baseline  07/05/18: met today with rollator  Time  --    Period  --    Status  Achieved      PT SHORT TERM GOAL #4   Title  Patient to perform sit to stand x 10 with unilateral UE support with Mod I demonstrating improved strength and efficiency of transfers    Baseline  07/05/18: performed 10 reps with single UE support on chair, needed rollator for stability upon standing, no physical assist needed.     Time  --    Period  --    Status  Achieved        PT Long Term Goals - 10/03/18 1407      PT LONG TERM GOAL #1   Title  Patient to be independent with final HEP for balance, strength, gait and other functional mobility. (All LTGs due 09/20/18)      PT LONG TERM GOAL #3   Title  Patient to improve TUG to </= 13 seconds with LRAD demonstrating reduced fall risk    Baseline  09/11/18:  15.79 sec's with rollator, improved just not to goal; 15.16 secs on 10-03-18    Status  Partially Met    Target Date  10/19/18      PT LONG TERM GOAL #4   Title  Patient to improve gait velocity with LRAD to at least 77f/sec for decreased fall risk    Baseline  09/11/18: 2.15 ft/sec with rollator (met with this device); 0.56 ft/sec with small base quad cane;  12.50 secs with rollator = 2.6 ft/sec      PT LONG TERM GOAL #5   Title  Paitent to improve R LE strength by >/= 1 manual muscle grade demonstrating improved strength and functional use    Status  Deferred            Plan - 10/11/18 0955    Clinical Impression Statement  No occurrences of Lt knee buckling during PT session today; pt forgot her Swedish knee cage brace so clinic's brace was used.  Pt continues to have signficant Rt knee genu recurvatum in stance and decrease Rt ankle control with decreased dorsiflexion noted due to pt not wearing ankle  orthosis.      Rehab Potential  Good    PT Frequency  2x / week    PT Duration  Other (comment)    PT Treatment/Interventions  ADLs/Self Care Home Management;Cryotherapy;Electrical Stimulation;DME Instruction;Moist Heat;Gait training;Stair training;Functional mobility training;Therapeutic activities;Therapeutic exercise;Balance training;Patient/family education;Neuromuscular re-education;Manual techniques;Taping;Passive range of motion;Orthotic Fit/Training    PT Next Visit Plan  continue gait with small base quad cane, ? need for stronger brace when using lesser AD such as cane; continue to work on postural and LE strengthening.     Consulted and Agree with Plan of Care  Patient       Patient will benefit from skilled therapeutic intervention in order to improve the following deficits and impairments:  Abnormal gait, Decreased activity tolerance, Decreased balance, Decreased safety awareness, Difficulty walking, Decreased strength, Decreased mobility, Decreased coordination, Pain, Impaired sensation  Visit Diagnosis: Other abnormalities of gait and mobility  Muscle weakness (generalized)     Problem List Patient Active Problem List   Diagnosis Date Noted  . Arthralgia of left hand 07/17/2018  . Debility   . Anemia of chronic disease   . Chronic pain syndrome   . Hypokalemia   . Acute lower UTI 09/04/2017  . L3 vertebral fracture ([place] 09/01/2017  . Constipation due to pain medication   . Muscle spasms of  both lower extremities   . Type 2 diabetes mellitus with peripheral neuropathy (HCC)   . Hypoalbuminemia due to protein-calorie malnutrition ([place])   . Radiculopathy 07/28/2017  . Lumbosacral spondylosis with radiculopathy 07/26/2017  . Paraplegia, incomplete ([place]) 01/07/2017  . 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/27/2016  . Myelopathy ([place]) 12/21/2016  . Neuropathic pain   . 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 Paraparesis ([place]) 12/15/2016  . Type 2 diabetes  mellitus without complication, without long-term current use of insulin ([place]) 11/02/2016  . Hyperlipidemia 11/02/2016  . Essential hypertension 11/02/2016  . 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    Alda Lea, PT 10/11/2018, 9:59 AM  [facility] [address] [place], [place], [address] Phone: [phone]   Fax:  [phone]  Name: LANYLA COSTELLO MRN: [id number] Date of Birth: 1951-01-12

## 2018-10-13 ENCOUNTER — Ambulatory Visit: Payer: PPO

## 2018-10-13 DIAGNOSIS — R2689 Other abnormalities of gait and mobility: Secondary | ICD-10-CM

## 2018-10-13 DIAGNOSIS — M6281 Muscle weakness (generalized): Secondary | ICD-10-CM | POA: Diagnosis not present

## 2018-10-13 DIAGNOSIS — R2681 Unsteadiness on feet: Secondary | ICD-10-CM

## 2018-10-13 NOTE — Therapy (Signed)
[facility] [address] [place], [place], [address] Phone: [phone]   Fax:  [phone]  Physical Therapy Treatment  Patient Details  Name: Megan [name] MRN: [id number] Date of Birth: Feb 05, 1951 Referring Provider (PT): Dr. Vertell Limber   Encounter Date: 10/13/2018  PT End of Session - 10/13/18 1534    Visit Number  36    Number of Visits  38    Authorization Type  Medicare    PT Start Time  1655    PT Stop Time  1615    PT Time Calculation (min)  44 min    Equipment Utilized During Treatment  Gait belt    Activity Tolerance  Patient tolerated treatment well    Behavior During Therapy  [facility] for tasks assessed/performed       Past Medical History:  Diagnosis Date  . Arthritis    "hands, back" (07/26/2017)  . Dyspnea    W/ PHYS CONDITION   . GERD (gastroesophageal reflux disease)   . History of bronchitis    "not since I quit smoking" (07/26/2017)  . History of kidney stones   . History of shingles   . Hyperlipidemia    takes Fish Oil daily  . Hypertension   . Neuromuscular disorder (HCC)    tingling toes  . 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[place]) 12/15/2016  . Pneumonia 2009  . PONV (postoperative nausea and vomiting)   . Restless leg   . Type 2 diabetes mellitus ([place])   . Uterine cancer ([place]) 1979   S/P hysterectomy  . Weakness    numbness and tingling in both feet r/t back    Past Surgical History:  Procedure Laterality Date  . ABDOMINAL EXPOSURE N/A 07/26/2017   Procedure: ABDOMINAL EXPOSURE;  Surgeon: Angelia Mould, MD;  Location: [facility];  Service: Vascular;  Laterality: N/A;  . ANTERIOR CERVICAL DECOMP/DISCECTOMY FUSION  2001  . ANTERIOR LUMBAR FUSION N/A 07/26/2017   Procedure: Lumbar five-Sacral one Anterior lumbar interbody fusion with Dr. Deitra Mayo for approach;  Surgeon: Ditty, Kevan Ny, MD;  Location: [facility];  Service: Neurosurgery;  Laterality: N/A;  . APPLICATION OF  ROBOTIC ASSISTANCE FOR SPINAL PROCEDURE  12/19/2016   Procedure: APPLICATION OF ROBOTIC ASSISTANCE FOR SPINAL PROCEDURE;  Surgeon: Kevan Ny Ditty, MD;  Location: [facility];  Service: Neurosurgery;;  . APPLICATION OF ROBOTIC ASSISTANCE FOR SPINAL PROCEDURE N/A 07/26/2017   Procedure: APPLICATION OF ROBOTIC ASSISTANCE FOR SPINAL PROCEDURE;  Surgeon: Ditty, Kevan Ny, MD;  Location: [facility];  Service: Neurosurgery;  Laterality: N/A;  . APPLICATION OF ROBOTIC ASSISTANCE FOR SPINAL PROCEDURE N/A 09/02/2017   Procedure: APPLICATION OF ROBOTIC ASSISTANCE FOR SPINAL PROCEDURE;  Surgeon: Ditty, Kevan Ny, MD;  Location: [facility];  Service: Neurosurgery;  Laterality: N/A;  . BACK SURGERY    . BLADDER SUSPENSION  1991   tack  . INGUINAL HERNIA REPAIR Right 1991  . LUMBAR FUSION  2016; 2017; 07/26/2017   L4-5; L2-3; L5-S1  . POSTERIOR LUMBAR FUSION 4 WITH HARDWARE REMOVAL N/A 09/02/2017   Procedure: Lumbar three-four redo laminectomy; Repositioning of Left Sacral two screw; Extension of lumbar fusion to Thoracic twelve;  Surgeon: Ditty, Kevan Ny, MD;  Location: [facility];  Service: Neurosurgery;  Laterality: N/A;  . TONSILLECTOMY AND ADENOIDECTOMY  1959  . TUMOR EXCISION     WERTHIN'S TUMORS BOTH SIDES OF NECK  . VAGINAL HYSTERECTOMY  1979    There were no vitals filed for this visit.  Subjective Assessment -  10/13/18 1535    Subjective  No falls to report, HEP is going well, "back is bothering me today".     Patient is accompained by:  Family member    Pertinent History  HTN, DM2, left T10 costotransversectomy, T10-T11 laminectomy for decompression, pedicle screws, T9-T12 (4/18), L5-S1 anterior retroperitoneal exposure, L5-S1 laminectomy, L5-S1 posterior lateral fixation with fusion (11/18); chronic pain    Limitations  Standing;Walking    How long can you walk comfortably?  household distances, limited community ambulation with RW vs rollator    Patient Stated Goals  improve balance and  transition to use of cane    Currently in Pain?  Yes    Pain Score  4     Pain Location  Back    Pain Orientation  Mid    Pain Descriptors / Indicators  Aching;Dull;Discomfort    Pain Type  Chronic pain    Pain Onset  Yesterday    Pain Frequency  Intermittent        [facility] Adult PT Treatment/Exercise - 10/13/18 1539      Ambulation/Gait   Ambulation/Gait  Yes    Ambulation/Gait Assistance  4: Min guard    Ambulation/Gait Assistance Details  No LOB noted, min guard required due to minimal instability.     Ambulation Distance (Feet)  230 Feet   230   Assistive device  Small based quad cane    Gait Pattern  Step-to pattern;Decreased dorsiflexion - right;Decreased step length - right;Right genu recurvatum    Ambulation Surface  Level;Indoor      Knee/Hip Exercises: Aerobic   Tread Mill  6 mins, speed: 0.1 progressing to 0.4        PT Short Term Goals - 07/05/18 1110      PT SHORT TERM GOAL #1   Title  patient to be independent with initial HEP for strength, balance and gait. (All STGs due 07/04/18)    Baseline  07/05/18: pt is independent with current HEP    Time  --    Period  --    Status  Achieved      PT SHORT TERM GOAL #2   Title  Patient to demonstrate TUG with rollator to </= 16 seconds for reduced fall risk    Baseline  07/05/18: 18.19 sec's with rollator, improved just not to goal    Time  --    Period  --    Status  Partially Met      PT SHORT TERM GOAL #3   Title  Patient to demonstrate 400' gait on level surfaces with rollator at Mod I without LOB or overt instability    Baseline  07/05/18: met today with rollator    Time  --    Period  --    Status  Achieved      PT SHORT TERM GOAL #4   Title  Patient to perform sit to stand x 10 with unilateral UE support with Mod I demonstrating improved strength and efficiency of transfers    Baseline  07/05/18: performed 10 reps with single UE support on chair, needed rollator for stability upon standing, no physical  assist needed.     Time  --    Period  --    Status  Achieved        PT Long Term Goals - 10/03/18 1407      PT LONG TERM GOAL #1   Title  Patient to be independent with final HEP for balance, strength, gait  and other functional mobility. (All LTGs due 09/20/18)      PT LONG TERM GOAL #3   Title  Patient to improve TUG to </= 13 seconds with LRAD demonstrating reduced fall risk    Baseline  09/11/18:  15.79 sec's with rollator, improved just not to goal; 15.16 secs on 10-03-18    Status  Partially Met    Target Date  10/19/18      PT LONG TERM GOAL #4   Title  Patient to improve gait velocity with LRAD to at least 23f/sec for decreased fall risk    Baseline  09/11/18: 2.15 ft/sec with rollator (met with this device); 0.56 ft/sec with small base quad cane;  12.50 secs with rollator = 2.6 ft/sec      PT LONG TERM GOAL #5   Title  Paitent to improve R LE strength by >/= 1 manual muscle grade demonstrating improved strength and functional use    Status  Deferred            Plan - 10/13/18 1623    Clinical Impression Statement  Todays skilled session focused on gait training with LBQC/knee brace around gym and on treadmill to increase speed with ambulation and to warm up prior to gait training with cane. Pt may be ready for D/C next session and metioned interest in OT due to UE complaints.     Rehab Potential  Good    PT Frequency  2x / week    PT Duration  Other (comment)    PT Treatment/Interventions  ADLs/Self Care Home Management;Cryotherapy;Electrical Stimulation;DME Instruction;Moist Heat;Gait training;Stair training;Functional mobility training;Therapeutic activities;Therapeutic exercise;Balance training;Patient/family education;Neuromuscular re-education;Manual techniques;Taping;Passive range of motion;Orthotic Fit/Training    PT Next Visit Plan  Possible D/C? continue gait with small base quad cane, ? need for stronger brace when using lesser AD such as cane; continue to work  on postural and LE strengthening.     Consulted and Agree with Plan of Care  Patient       Patient will benefit from skilled therapeutic intervention in order to improve the following deficits and impairments:  Abnormal gait, Decreased activity tolerance, Decreased balance, Decreased safety awareness, Difficulty walking, Decreased strength, Decreased mobility, Decreased coordination, Pain, Impaired sensation  Visit Diagnosis: Other abnormalities of gait and mobility  Muscle weakness (generalized)  Unsteadiness on feet     Problem List Patient Active Problem List   Diagnosis Date Noted  . Arthralgia of left hand 07/17/2018  . Debility   . Anemia of chronic disease   . Chronic pain syndrome   . Hypokalemia   . Acute lower UTI 09/04/2017  . L3 vertebral fracture ([place] 09/01/2017  . Constipation due to pain medication   . Muscle spasms of both lower extremities   . Type 2 diabetes mellitus with peripheral neuropathy (HCC)   . Hypoalbuminemia due to protein-calorie malnutrition ([place]   . Radiculopathy 07/28/2017  . Lumbosacral spondylosis with radiculopathy 07/26/2017  . Paraplegia, incomplete ([place] 01/07/2017  . 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/27/2016  . Myelopathy ([place] 12/21/2016  . Neuropathic pain   . 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 Paraparesis ([place] 12/15/2016  . Type 2 diabetes mellitus without complication, without long-term current use of insulin ([place] 11/02/2016  . Hyperlipidemia 11/02/2016  . Essential hypertension 11/02/2016  . 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   Chassity Felts, PTA  Chassity A Felts 10/13/2018, 4:26 PM  [id number]  [place], [place], [address] Phone: [phone]   Fax:  [phone]  Name: Megan [name] MRN: [id number] Date of Birth: Oct 20, 1950

## 2018-10-17 ENCOUNTER — Ambulatory Visit: Payer: PPO | Admitting: Physical Therapy

## 2018-10-19 ENCOUNTER — Ambulatory Visit: Payer: PPO | Attending: Physical Medicine & Rehabilitation | Admitting: Physical Therapy

## 2018-10-19 DIAGNOSIS — M6281 Muscle weakness (generalized): Secondary | ICD-10-CM

## 2018-10-19 DIAGNOSIS — R2689 Other abnormalities of gait and mobility: Secondary | ICD-10-CM

## 2018-10-19 DIAGNOSIS — R2681 Unsteadiness on feet: Secondary | ICD-10-CM | POA: Diagnosis not present

## 2018-10-19 NOTE — Patient Instructions (Signed)
Step: Up, Anterior    Stand facing step. Place involved leg up. Raise body using top leg only. Step down backward, involved leg first, lower body using other leg. Repeat _10___ times per set. Do __1-2__ sets per session. Do __5__ sessions per week.   6" STEP USED ON 2-6-20Anterior Step-Down    Stand with both feet on _2__ inch step. Step down in A direction with left foot, touching heel to the floor and return _10-15__ times. __1-2_ sets 1 times per day.   2" step used on 10-19-18;   4" step was trialed but too difficult [email or web address].[email or web address].[email or web address]

## 2018-10-20 NOTE — Therapy (Signed)
[facility] [address] [place], [place], [address] Phone: [phone]   Fax:  [phone]  Physical Therapy Treatment  Patient Details  Name: Megan [name] MRN: [id number] Date of Birth: [birth year] Referring Provider (PT): Dr. Macarthur Critchley Date: 10/19/2018  PT End of Session - 10/20/18 2038    Visit Number  37    Number of Visits  38    Date for PT Re-Evaluation  10/19/18    Authorization Type  Medicare    PT Start Time  1315    PT Stop Time  1402    PT Time Calculation (min)  47 min       Past Medical History:  Diagnosis Date  . Arthritis    "hands, back" (07/26/2017)  . Dyspnea    W/ PHYS CONDITION   . GERD (gastroesophageal reflux disease)   . History of bronchitis    "not since I quit smoking" (07/26/2017)  . History of kidney stones   . History of shingles   . Hyperlipidemia    takes Fish Oil daily  . Hypertension   . Neuromuscular disorder (HCC)    tingling toes  . 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[place]) 12/15/2016  . Pneumonia 2009  . PONV (postoperative nausea and vomiting)   . Restless leg   . Type 2 diabetes mellitus ([place])   . Uterine cancer ([place]) 1979   S/P hysterectomy  . Weakness    numbness and tingling in both feet r/t back    Past Surgical History:  Procedure Laterality Date  . ABDOMINAL EXPOSURE N/A 07/26/2017   Procedure: ABDOMINAL EXPOSURE;  Surgeon: Angelia Mould, MD;  Location: [facility];  Service: Vascular;  Laterality: N/A;  . ANTERIOR CERVICAL DECOMP/DISCECTOMY FUSION  2001  . ANTERIOR LUMBAR FUSION N/A 07/26/2017   Procedure: Lumbar five-Sacral one Anterior lumbar interbody fusion with Dr. Deitra Mayo for approach;  Surgeon: Ditty, Kevan Ny, MD;  Location: [facility];  Service: Neurosurgery;  Laterality: N/A;  . APPLICATION OF ROBOTIC ASSISTANCE FOR SPINAL PROCEDURE  12/19/2016   Procedure: APPLICATION OF ROBOTIC ASSISTANCE FOR SPINAL PROCEDURE;  Surgeon:  Kevan Ny Ditty, MD;  Location: [facility];  Service: Neurosurgery;;  . APPLICATION OF ROBOTIC ASSISTANCE FOR SPINAL PROCEDURE N/A 07/26/2017   Procedure: APPLICATION OF ROBOTIC ASSISTANCE FOR SPINAL PROCEDURE;  Surgeon: Ditty, Kevan Ny, MD;  Location: [facility];  Service: Neurosurgery;  Laterality: N/A;  . APPLICATION OF ROBOTIC ASSISTANCE FOR SPINAL PROCEDURE N/A 09/02/2017   Procedure: APPLICATION OF ROBOTIC ASSISTANCE FOR SPINAL PROCEDURE;  Surgeon: Ditty, Kevan Ny, MD;  Location: [facility];  Service: Neurosurgery;  Laterality: N/A;  . BACK SURGERY    . BLADDER SUSPENSION  1991   tack  . INGUINAL HERNIA REPAIR Right 1991  . LUMBAR FUSION  2016; 2017; 07/26/2017   L4-5; L2-3; L5-S1  . POSTERIOR LUMBAR FUSION 4 WITH HARDWARE REMOVAL N/A 09/02/2017   Procedure: Lumbar three-four redo laminectomy; Repositioning of Left Sacral two screw; Extension of lumbar fusion to Thoracic twelve;  Surgeon: Ditty, Kevan Ny, MD;  Location: [facility];  Service: Neurosurgery;  Laterality: N/A;  . TONSILLECTOMY AND ADENOIDECTOMY  1959  . TUMOR EXCISION     WERTHIN'S TUMORS BOTH SIDES OF NECK  . VAGINAL HYSTERECTOMY  1979    There were no vitals filed for this visit.  Subjective Assessment - 10/20/18 2030    Subjective  Pt states she forgot her knee brace again; states she continues to walk in home  some with quad cane but has not gone outside with it:  states she sees Dr. Vertell Limber on Monday - will talk to him about coming back to PT in the future    Pertinent History  HTN, DM2, left T10 costotransversectomy, T10-T11 laminectomy for decompression, pedicle screws, T9-T12 (4/18), L5-S1 anterior retroperitoneal exposure, L5-S1 laminectomy, L5-S1 posterior lateral fixation with fusion (11/18); chronic pain    Patient Stated Goals  improve balance and transition to use of cane    Currently in Pain?  Yes    Pain Score  3     Pain Location  Back    Pain Orientation  Mid    Pain Descriptors / Indicators   Aching;Dull    Pain Type  Chronic pain    Pain Onset  More than a month ago    Pain Frequency  Intermittent                       [facility] Adult PT Treatment/Exercise - 10/20/18 0001      Ambulation/Gait   Ambulation/Gait  Yes    Ambulation/Gait Assistance  5: Supervision    Ambulation/Gait Assistance Details  Rt knee hyperextension as pt not wearing any brace today    Ambulation Distance (Feet)  230 Feet    Assistive device  Small based quad cane    Gait Pattern  Step-to pattern;Decreased dorsiflexion - right;Decreased step length - right;Right genu recurvatum    Ambulation Surface  Level;Indoor      Knee/Hip Exercises: Machines for Strengthening   Cybex Leg Press  bil. LE's 50# 20 reps:  RLE only 25# 15 reps       Knee/Hip Exercises: Standing   Forward Step Up  10 reps;Right;1 set;Hand Hold: 2;Step Height: 6"    Step Down  Step Height: 2";Right;1 set;10 reps;Hand Hold: 2   insdie // bars            PT Education - 10/20/18 2037    Education Details  added step ups and step down exercise to HEP    Person(s) Educated  Patient    Methods  Explanation;Demonstration;Handout    Comprehension  Verbalized understanding;Returned demonstration       PT Short Term Goals - 07/05/18 1110      PT SHORT TERM GOAL #1   Title  patient to be independent with initial HEP for strength, balance and gait. (All STGs due 07/04/18)    Baseline  07/05/18: pt is independent with current HEP    Time  --    Period  --    Status  Achieved      PT SHORT TERM GOAL #2   Title  Patient to demonstrate TUG with rollator to </= 16 seconds for reduced fall risk    Baseline  07/05/18: 18.19 sec's with rollator, improved just not to goal    Time  --    Period  --    Status  Partially Met      PT SHORT TERM GOAL #3   Title  Patient to demonstrate 400' gait on level surfaces with rollator at Mod I without LOB or overt instability    Baseline  07/05/18: met today with rollator    Time   --    Period  --    Status  Achieved      PT SHORT TERM GOAL #4   Title  Patient to perform sit to stand x 10 with unilateral UE support with Mod I demonstrating  improved strength and efficiency of transfers    Baseline  07/05/18: performed 10 reps with single UE support on chair, needed rollator for stability upon standing, no physical assist needed.     Time  --    Period  --    Status  Achieved        PT Long Term Goals - 10/19/18 1319      PT LONG TERM GOAL #1   Title  Patient to be independent with final HEP for balance, strength, gait and other functional mobility. (All LTGs due 09/20/18)    Status  Achieved      PT LONG TERM GOAL #2   Title  patient to demonstrate gait with LRAD over various levels and surfaces for >/= 500' with MOD I without LOB or overt instability    Baseline  09/11/18: met with rollator; pt using rollator for community ambulation; pt using SBQC some    Status  Partially Met      PT LONG TERM GOAL #3   Title  Patient to improve TUG to </= 13 seconds with LRAD demonstrating reduced fall risk    Baseline  09/11/18:  15.79 sec's with rollator, improved just not to goal; 15.16 secs on 10-03-18; 14.94 secs with rollator on 10-19-18:  35.34 secs with SBQC;      Status  Not Met      PT LONG TERM GOAL #4   Title  Patient to improve gait velocity with LRAD to at least 21f/sec for decreased fall risk    Baseline  09/11/18: 2.15 ft/sec with rollator (met with this device); 0.56 ft/sec with small base quad cane;  12.50 secs with rollator = 2.6 ft/sec on 10-03-18:         with rollator= 16.85 secs = 1.95 ft/sec on 10-19-18:  48.34 secs = .68 ft/sec with SBQC -- on 10-19-18    Status  Not Met      PT LONG TERM GOAL #5   Title  Paitent to improve R LE strength by >/= 1 manual muscle grade demonstrating improved strength and functional use    Status  Deferred            Plan - 10/20/18 2048    Clinical Impression Statement  Pt has met LTG #1 and partially met LTG #2:   LTG's #3 and 4 not met and LTG #5 deferred;  pt is not using AFO but is wearing Swedish knee cage only which is not preventing Rt knee hyperextension.  Pt continues to have decreased Rt foot clearance due to dorsflexor weakness with Rt ankle instability but pt does not want to wear AFO.  Pt has plateaued in maximizing functional progress at this time.                                                                                                                     Rehab Potential  Good    PT Frequency  2x / week    PT Duration  Other (comment)    PT Treatment/Interventions  ADLs/Self Care Home Management;Cryotherapy;Electrical Stimulation;DME Instruction;Moist Heat;Gait training;Stair training;Functional mobility training;Therapeutic activities;Therapeutic exercise;Balance training;Patient/family education;Neuromuscular re-education;Manual techniques;Taping;Passive range of motion;Orthotic Fit/Training    PT Next Visit Plan  N/A - D/C  today    PT Home Exercise Plan  [id number]    Consulted and Agree with Plan of Care  Patient       Patient will benefit from skilled therapeutic intervention in order to improve the following deficits and impairments:  Abnormal gait, Decreased activity tolerance, Decreased balance, Decreased safety awareness, Difficulty walking, Decreased strength, Decreased mobility, Decreased coordination, Pain, Impaired sensation  Visit Diagnosis: Other abnormalities of gait and mobility  Muscle weakness (generalized)  Unsteadiness on feet     Problem List Patient Active Problem List   Diagnosis Date Noted  . Arthralgia of left hand 07/17/2018  . Debility   . Anemia of chronic disease   . Chronic pain syndrome   . Hypokalemia   . Acute lower UTI 09/04/2017  . L3 vertebral fracture ([place]) 09/01/2017  . Constipation due to pain medication   . Muscle spasms of both lower extremities   . Type 2 diabetes mellitus with peripheral neuropathy (HCC)   . Hypoalbuminemia due  to protein-calorie malnutrition ([place])   . Radiculopathy 07/28/2017  . Lumbosacral spondylosis with radiculopathy 07/26/2017  . Paraplegia, incomplete ([place]) 01/07/2017  . 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/27/2016  . Myelopathy ([place]) 12/21/2016  . Neuropathic pain   . 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 Paraparesis ([place]) 12/15/2016  . Type 2 diabetes mellitus without complication, without long-term current use of insulin ([place]) 11/02/2016  . Hyperlipidemia 11/02/2016  . Essential hypertension 11/02/2016  . 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     PHYSICAL THERAPY DISCHARGE SUMMARY  Visits from Start of Care: 37  Current functional level related to goals / functional outcomes: See above for progress towards goals - LTG's partially met as pt continues to use rollator for community/housefold amb. And is using SBQC some for household amb.    Remaining deficits: Cont RLE weakness with gait deviations Cont. Decr. Standing balance   Education / Equipment: Pt has been instructed in HEP for RLE strengthening & stretching exercises Plan: Patient agrees to discharge.  Patient goals were partially met. Patient is being discharged due to meeting the stated rehab goals.  ?????        Alda Lea, PT 10/20/2018, 9:04 PM  [facility] [address] [place], [place], [address] Phone: [phone]   Fax:  [phone]  Name: Megan [name] MRN: [id number] Date of Birth: 1951/06/03

## 2018-10-22 ENCOUNTER — Other Ambulatory Visit: Payer: Self-pay | Admitting: Primary Care

## 2018-10-22 DIAGNOSIS — M62838 Other muscle spasm: Secondary | ICD-10-CM

## 2018-10-22 DIAGNOSIS — E119 Type 2 diabetes mellitus without complications: Secondary | ICD-10-CM

## 2018-10-23 DIAGNOSIS — M5416 Radiculopathy, lumbar region: Secondary | ICD-10-CM | POA: Diagnosis not present

## 2018-10-23 DIAGNOSIS — M4316 Spondylolisthesis, lumbar region: Secondary | ICD-10-CM | POA: Diagnosis not present

## 2018-10-23 DIAGNOSIS — M549 Dorsalgia, unspecified: Secondary | ICD-10-CM | POA: Diagnosis not present

## 2018-10-23 DIAGNOSIS — M5124 Other intervertebral disc displacement, thoracic region: Secondary | ICD-10-CM | POA: Diagnosis not present

## 2018-11-26 ENCOUNTER — Other Ambulatory Visit: Payer: Self-pay | Admitting: Primary Care

## 2018-11-26 DIAGNOSIS — M4727 Other spondylosis with radiculopathy, lumbosacral region: Secondary | ICD-10-CM

## 2018-11-26 DIAGNOSIS — G894 Chronic pain syndrome: Secondary | ICD-10-CM

## 2018-12-07 ENCOUNTER — Other Ambulatory Visit: Payer: Self-pay | Admitting: Primary Care

## 2018-12-07 DIAGNOSIS — M62838 Other muscle spasm: Secondary | ICD-10-CM

## 2019-01-13 ENCOUNTER — Telehealth: Payer: Self-pay | Admitting: Primary Care

## 2019-01-13 DIAGNOSIS — M62838 Other muscle spasm: Secondary | ICD-10-CM

## 2019-01-13 DIAGNOSIS — E119 Type 2 diabetes mellitus without complications: Secondary | ICD-10-CM

## 2019-01-13 NOTE — Telephone Encounter (Signed)
I filled her metformin. She is due for her f/u on diabetes by the end of the month. Please set up. Thanks.

## 2019-01-19 MED ORDER — BACLOFEN 10 MG PO TABS: 10.0000 mg | ORAL_TABLET | Freq: Three times a day (TID) | ORAL | 0 refills | Status: DC | PRN

## 2019-01-19 NOTE — Telephone Encounter (Signed)
Noted,refill sent

## 2019-01-19 NOTE — Telephone Encounter (Signed)
Pt called to request a refill of Baclofen 10mg  sent to [facility], [place]. She scheduled appt 5/14 for virtual visit with labs 5/12.

## 2019-01-22 ENCOUNTER — Other Ambulatory Visit: Payer: Self-pay | Admitting: Primary Care

## 2019-01-22 DIAGNOSIS — D638 Anemia in other chronic diseases classified elsewhere: Secondary | ICD-10-CM

## 2019-01-22 DIAGNOSIS — I10 Essential (primary) hypertension: Secondary | ICD-10-CM

## 2019-01-22 DIAGNOSIS — E119 Type 2 diabetes mellitus without complications: Secondary | ICD-10-CM

## 2019-01-23 ENCOUNTER — Other Ambulatory Visit (INDEPENDENT_AMBULATORY_CARE_PROVIDER_SITE_OTHER): Payer: PPO

## 2019-01-23 DIAGNOSIS — I10 Essential (primary) hypertension: Secondary | ICD-10-CM | POA: Diagnosis not present

## 2019-01-23 DIAGNOSIS — E119 Type 2 diabetes mellitus without complications: Secondary | ICD-10-CM

## 2019-01-23 DIAGNOSIS — D638 Anemia in other chronic diseases classified elsewhere: Secondary | ICD-10-CM | POA: Diagnosis not present

## 2019-01-24 LAB — LIPID PANEL
Cholesterol: 142 mg/dL (ref 0–200)
HDL: 41.8 mg/dL (ref >39.00)
NonHDL: 100.19
Total CHOL/HDL Ratio: 3
Triglycerides: 216 mg/dL — ABNORMAL HIGH (ref 0.0–149.0)
VLDL: 43.2 mg/dL — ABNORMAL HIGH (ref 0.0–40.0)

## 2019-01-24 LAB — CBC
HCT: 37.7 % (ref 36.0–46.0)
Hemoglobin: 12.9 g/dL (ref 12.0–15.0)
MCHC: 34.2 g/dL (ref 30.0–36.0)
MCV: 86.5 fl (ref 78.0–100.0)
Platelets: 209 10*3/uL (ref 150.0–400.0)
RBC: 4.36 Mil/uL (ref 3.87–5.11)
RDW: 13.6 % (ref 11.5–15.5)
WBC: 6.2 10*3/uL (ref 4.0–10.5)

## 2019-01-24 LAB — COMPREHENSIVE METABOLIC PANEL
ALT: 19 U/L (ref 0–35)
AST: 18 U/L (ref 0–37)
Albumin: 4.1 g/dL (ref 3.5–5.2)
Alkaline Phosphatase: 90 U/L (ref 39–117)
BUN: 17 mg/dL (ref 6–23)
CO2: 26 mEq/L (ref 19–32)
Calcium: 8.9 mg/dL (ref 8.4–10.5)
Chloride: 105 mEq/L (ref 96–112)
Creatinine, Ser: 0.8 mg/dL (ref 0.40–1.20)
GFR: 71.82 mL/min (ref >60.00)
Glucose, Bld: 102 mg/dL — ABNORMAL HIGH (ref 70–99)
Potassium: 4.5 mEq/L (ref 3.5–5.1)
Sodium: 139 mEq/L (ref 135–145)
Total Bilirubin: 0.4 mg/dL (ref 0.2–1.2)
Total Protein: 7.1 g/dL (ref 6.0–8.3)

## 2019-01-24 LAB — HEMOGLOBIN A1C: Hgb A1c MFr Bld: 6.5 % (ref 4.6–6.5)

## 2019-01-24 LAB — LDL CHOLESTEROL, DIRECT: Direct LDL: 64 mg/dL

## 2019-01-25 ENCOUNTER — Ambulatory Visit (INDEPENDENT_AMBULATORY_CARE_PROVIDER_SITE_OTHER): Payer: PPO | Admitting: Primary Care

## 2019-01-25 ENCOUNTER — Encounter: Payer: Self-pay | Admitting: Primary Care

## 2019-01-25 DIAGNOSIS — G894 Chronic pain syndrome: Secondary | ICD-10-CM | POA: Diagnosis not present

## 2019-01-25 DIAGNOSIS — M4727 Other spondylosis with radiculopathy, lumbosacral region: Secondary | ICD-10-CM | POA: Diagnosis not present

## 2019-01-25 DIAGNOSIS — K592 Neurogenic bowel, not elsewhere classified: Secondary | ICD-10-CM | POA: Diagnosis not present

## 2019-01-25 DIAGNOSIS — I10 Essential (primary) hypertension: Secondary | ICD-10-CM

## 2019-01-25 DIAGNOSIS — G8222 Paraplegia, incomplete: Secondary | ICD-10-CM

## 2019-01-25 DIAGNOSIS — D638 Anemia in other chronic diseases classified elsewhere: Secondary | ICD-10-CM

## 2019-01-25 DIAGNOSIS — E119 Type 2 diabetes mellitus without complications: Secondary | ICD-10-CM | POA: Diagnosis not present

## 2019-01-25 DIAGNOSIS — E785 Hyperlipidemia, unspecified: Secondary | ICD-10-CM

## 2019-01-25 DIAGNOSIS — M62838 Other muscle spasm: Secondary | ICD-10-CM

## 2019-01-25 DIAGNOSIS — M792 Neuralgia and neuritis, unspecified: Secondary | ICD-10-CM

## 2019-01-25 NOTE — Progress Notes (Signed)
Subjective:    Patient ID: Megan [name], female    DOB: 10-09-1950, 68 y.o.   MRN: [id number]  HPI  Virtual Visit via Video Note  I connected with Megan [name] on 01/25/19 at 11:00 AM EDT by a video enabled telemedicine application and verified that I am speaking with the correct person using two identifiers.  Location: Patient: Home Provider: Office   I discussed the limitations of evaluation and management by telemedicine and the availability of in person appointments. The patient expressed understanding and agreed to proceed.  Patient was unable to conduct video visit as she has no capability. Our visit had to be carried out via phone.  History of Present Illness:  Megan [name] is a 68 year old female who presents today for follow up.  1) Type 2 Diabetes:  Current medications include: Metformin 500 mg BID.  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 She is doing in home PT from her own regimen.  Managed on gabapentin 300 mg TID, baclofen 10 mg BID, oxybutynin, Celebrex 200 mg BID. She is walking more with her walker, some with her cane.   3) Essential Hypertension: Currently managed on lisinopril 10 mg. She denies chest pain, shortness of breath, dizziness.   BP Readings from Last 3 Encounters:  01/25/19 128/67   07/17/18 132/84  01/24/18 134/72      Observations/Objective:  Alert and oriented. No distress. Speaking in complete sentences.  Assessment and Plan:  See problem based charting  Follow Up Instructions:  It is important that you improve your diet. Please limit carbohydrates in the form of white bread, rice, pasta, sweets, fast food, fried food, sugary drinks, etc. Increase your consumption of fresh fruits and vegetables, whole grains, lean protein.  Ensure you are consuming 64 ounces of water daily.  Continue all of your medications as prescribed.  Schedule your physical for 6 months from now. It was a pleasure to see you today! Allie Bossier, NP-C    I discussed the assessment and treatment plan with the patient. The patient was provided an opportunity to ask questions and all were answered. The patient agreed with the plan and demonstrated an understanding of the instructions.   The patient was advised to call back or seek an in-person evaluation if the symptoms worsen or if the condition fails to improve as anticipated.     Pleas Koch, NP    Review of Systems  Eyes: Negative for visual disturbance.  Respiratory: Negative for shortness of breath.   Cardiovascular: Negative for chest pain.  Musculoskeletal: Positive for arthralgias and myalgias.  Neurological: Negative for dizziness and headaches.       Past Medical History:  Diagnosis Date  . Arthritis    "hands, back" (07/26/2017)  . Dyspnea    W/ PHYS CONDITION   . GERD (gastroesophageal reflux disease)   . History of bronchitis    "  not since I quit smoking" (07/26/2017)  . History of kidney stones   . History of shingles   . Hyperlipidemia    takes Fish Oil daily  . Hypertension   . Neuromuscular disorder (HCC)    tingling toes  . 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 Pneumonia 2009  . PONV (postoperative nausea and vomiting)   . Restless leg   . Type 2 diabetes mellitus ([place])    . Uterine cancer ([place]) 1979   S/P hysterectomy  . Weakness    numbness and tingling in both feet r/t back     Social History   Socioeconomic History  . Marital status: Married    Spouse name: [name]  . Number of children: 1  . Years of education: 24  . Highest education level: Not on file  Occupational History  . Not on file  Social Needs  . Financial resource strain: Not on file  . Food insecurity:    Worry: Not on file    Inability: Not on file  . Transportation needs:    Medical: Not on file    Non-medical: Not on file  Tobacco Use  . Smoking status: Former Smoker    Packs/day: 1.50    Years: 37.00    Pack years: 55.50    Types: Cigarettes    Last attempt to quit: 05/13/2006    Years since quitting: 12.7  . Smokeless tobacco: Never Used  Substance and Sexual Activity  . Alcohol use: Yes    Comment: 07/26/2017 "glass of wine once/wk if not on RX"  . Drug use: No  . Sexual activity: Not Currently    Birth control/protection: Surgical  Lifestyle  . Physical activity:    Days per week: Not on file    Minutes per session: Not on file  . Stress: Not on file  Relationships  . Social connections:    Talks on phone: Not on file    Gets together: Not on file    Attends religious service: Not on file    Active member of club or organization: Not on file    Attends meetings of clubs or organizations: Not on file    Relationship status: Not on file  . Intimate partner violence:    Fear of current or ex partner: Not on file    Emotionally abused: Not on file    Physically abused: Not on file    Forced sexual activity: Not on file  Other Topics Concern  . Not on file  Social History Narrative   Lives w/ husband   Married.   1 child, 1 grandchildren.   Retired. Once worked for [employer].   Enjoys reading.     Past Surgical History:  Procedure Laterality Date  . ABDOMINAL EXPOSURE N/A 07/26/2017   Procedure: ABDOMINAL EXPOSURE;  Surgeon: Angelia Mould, MD;  Location: [place];  Service: Vascular;  Laterality: N/A;  . ANTERIOR CERVICAL DECOMP/DISCECTOMY FUSION  2001  . ANTERIOR LUMBAR FUSION N/A 07/26/2017   Procedure: Lumbar five-Sacral one Anterior lumbar interbody fusion with Dr. Deitra Mayo for approach;  Surgeon: Ditty, Kevan Ny, MD;  Location: [place];  Service: Neurosurgery;  Laterality: N/A;  . APPLICATION OF ROBOTIC ASSISTANCE FOR SPINAL PROCEDURE  12/19/2016   Procedure: APPLICATION OF ROBOTIC ASSISTANCE FOR SPINAL PROCEDURE;  Surgeon: Kevan Ny Ditty, MD;  Location: [place];  Service: Neurosurgery;;  . APPLICATION OF ROBOTIC ASSISTANCE FOR SPINAL PROCEDURE N/A 07/26/2017   Procedure: APPLICATION OF  ROBOTIC ASSISTANCE FOR SPINAL PROCEDURE;  Surgeon: Ditty, Kevan Ny, MD;  Location: [place];  Service: Neurosurgery;  Laterality: N/A;  . APPLICATION OF ROBOTIC ASSISTANCE FOR SPINAL PROCEDURE N/A 09/02/2017   Procedure: APPLICATION OF ROBOTIC ASSISTANCE FOR SPINAL PROCEDURE;  Surgeon: Ditty, Kevan Ny, MD;  Location: [place];  Service: Neurosurgery;  Laterality: N/A;  . BACK SURGERY    . BLADDER SUSPENSION  1991   tack  . INGUINAL HERNIA REPAIR Right 1991  . LUMBAR FUSION  2016; 2017; 07/26/2017   L4-5; L2-3; L5-S1  . POSTERIOR LUMBAR FUSION 4 WITH HARDWARE REMOVAL N/A 09/02/2017   Procedure: Lumbar three-four redo laminectomy; Repositioning of Left Sacral two screw; Extension of lumbar fusion to Thoracic twelve;  Surgeon: Ditty, Kevan Ny, MD;  Location: [place];  Service: Neurosurgery;  Laterality: N/A;  . TONSILLECTOMY AND ADENOIDECTOMY  1959  . TUMOR EXCISION     WERTHIN'S TUMORS BOTH SIDES OF NECK  . VAGINAL HYSTERECTOMY  1979    Family History  Problem Relation Age of Onset  . Diabetes Mother   . Dementia Mother   . Cirrhosis Mother        Non alcoholic  . COPD Father   . Diabetes Brother     Allergies  Allergen Reactions  . Shellfish Allergy Anaphylaxis, Swelling and Other (See Comments)     Tongue swells  . Oxycodone Nausea Only  . Penicillins Rash and Other (See Comments)    Has patient had a PCN reaction causing immediate rash, facial/tongue/throat swelling, SOB or lightheadedness with hypotension: Yes Has patient had a PCN reaction causing severe rash involving mucus membranes or skin necrosis: No Has patient had a PCN reaction that required hospitalization No Has patient had a PCN reaction occurring within the last 10 years: No If all of the above answers are "NO", then may proceed with Cephalosporin use.    Current Outpatient Medications on File Prior to Visit  Medication Sig Dispense Refill  . atorvastatin (LIPITOR) 20 MG tablet Take 1 tablet (20 mg total) by mouth every evening. For cholesterol. 90 tablet 2  . baclofen (LIORESAL) 10 MG tablet Take 1 tablet (10 mg total) by mouth 3 (three) times daily as needed for muscle spasms. 180 tablet 0  . Biotin 5000 MCG CAPS Take 5,000 mcg by mouth every morning.     . celecoxib (CELEBREX) 200 MG capsule TAKE 1 CAPSULE(200 MG) BY MOUTH TWICE DAILY AS NEEDED FOR MODERATE PAIN 180 capsule 0  . cholecalciferol (VITAMIN D) 1000 units tablet Take 2 tablets (2,000 Units total) by mouth daily after lunch. (Patient taking differently: Take 1,000 Units by mouth daily after lunch. ) 30 tablet 0  . Coenzyme Q10 (COQ10) 100 MG CAPS Take 100 mg daily by mouth.     . docusate sodium (COLACE) 100 MG capsule Take 1 capsule (100 mg total) by mouth 2 (two) times daily. 10 capsule 0  . esomeprazole (NEXIUM 24HR) 20 MG capsule Take 1 capsule (20 mg total) by mouth daily at 12 noon. 90 capsule 1  . gabapentin (NEURONTIN) 300 MG capsule TAKE ONE CAPSULE BY MOUTH THREE TIMES DAILY 270 capsule 1  . Garlic 250 MG CAPS Take 500 mg 2 (two) times daily by mouth.    Marland Kitchen lisinopril (PRINIVIL,ZESTRIL) 10 MG tablet Take 1 tablet (10 mg total) by mouth daily. For blood pressure. 90 tablet 3  . metFORMIN (GLUCOPHAGE) 500 MG tablet TAKE 1 TABLET BY MOUTH TWICE DAILY  180 tablet 0  . oxybutynin (OXYTROL)  3.9 MG/24HR Place 1 patch onto the skin 5 days.     . polyethylene glycol (MIRALAX / GLYCOLAX) packet Take 17 g by mouth daily. 14 each 0  . senna-docusate (SENOKOT-S) 8.6-50 MG tablet Take 2 tablets by mouth 2 (two) times daily. (Patient taking differently: Take 1-2 tablets by mouth daily as needed for mild constipation. )     No current facility-administered medications on file prior to visit.     BP 128/67   Wt 174 lb (78.9 kg)   BMI 24.97 kg/m    Objective:   Physical Exam  Constitutional: She is oriented to person, place, and time.  Respiratory: Effort normal.  Neurological: She is alert and oriented to person, place, and time.  Psychiatric: She has a normal mood and affect.           Assessment & Plan:

## 2019-01-25 NOTE — Assessment & Plan Note (Signed)
Doing well on oxybutynin patches. Continue same.

## 2019-01-25 NOTE — Assessment & Plan Note (Signed)
Doing well on gabapentin 300 mg TID, continue same.

## 2019-01-25 NOTE — Assessment & Plan Note (Signed)
Doing well on Baclofen for which she uses BID. Continue same.

## 2019-01-25 NOTE — Patient Instructions (Signed)
It is important that you improve your diet. Please limit carbohydrates in the form of white bread, rice, pasta, sweets, fast food, fried food, sugary drinks, etc. Increase your consumption of fresh fruits and vegetables, whole grains, lean protein.  Ensure you are consuming 64 ounces of water daily.  Continue all of your medications as prescribed.  Schedule your physical for 6 months from now. It was a pleasure to see you today! Allie Bossier, NP-C

## 2019-01-25 NOTE — Assessment & Plan Note (Signed)
Recent A1C of 6.5 which is well controlled. Continue Metformin 500 mg BID.  Managed on statin and ACE Foot exam next visit. Eye exam UTD. Pneumonia vaccination UTD.  Follow up in 6 months.

## 2019-01-25 NOTE — Assessment & Plan Note (Signed)
Resolved based off of recent labs. Not taking oral iron, continue off.

## 2019-01-25 NOTE — Assessment & Plan Note (Signed)
Recent LDL at goal, continue atorvastatin.

## 2019-01-25 NOTE — Assessment & Plan Note (Signed)
Stable with home readings, continue lisinopril.  CMP reviewed and is unremarkable.

## 2019-01-25 NOTE — Assessment & Plan Note (Signed)
Doing well overall, working to walk often with her walker, using her cane on occasion. Continue home PT exercises.   Continue current medication regimen.

## 2019-01-25 NOTE — Assessment & Plan Note (Signed)
Overall improving. Continue gabapentin, baclofen, and celebrex.

## 2019-02-24 ENCOUNTER — Other Ambulatory Visit: Payer: Self-pay | Admitting: Primary Care

## 2019-02-24 DIAGNOSIS — E119 Type 2 diabetes mellitus without complications: Secondary | ICD-10-CM

## 2019-02-24 DIAGNOSIS — G894 Chronic pain syndrome: Secondary | ICD-10-CM

## 2019-02-24 DIAGNOSIS — I10 Essential (primary) hypertension: Secondary | ICD-10-CM

## 2019-02-24 DIAGNOSIS — M4727 Other spondylosis with radiculopathy, lumbosacral region: Secondary | ICD-10-CM

## 2019-02-24 DIAGNOSIS — M4714 Other spondylosis with myelopathy, thoracic region: Secondary | ICD-10-CM

## 2019-03-18 ENCOUNTER — Other Ambulatory Visit: Payer: Self-pay | Admitting: Primary Care

## 2019-03-18 DIAGNOSIS — M62838 Other muscle spasm: Secondary | ICD-10-CM

## 2019-03-20 NOTE — Telephone Encounter (Signed)
Noted, refill sent to pharmacy. 

## 2019-03-20 NOTE — Telephone Encounter (Signed)
Last filled 01/19/2019, last OV 01/25/2019... please advise

## 2019-04-11 ENCOUNTER — Other Ambulatory Visit: Payer: Self-pay

## 2019-05-25 ENCOUNTER — Other Ambulatory Visit: Payer: Self-pay | Admitting: Primary Care

## 2019-05-25 DIAGNOSIS — G894 Chronic pain syndrome: Secondary | ICD-10-CM

## 2019-05-25 DIAGNOSIS — M4727 Other spondylosis with radiculopathy, lumbosacral region: Secondary | ICD-10-CM

## 2019-05-25 DIAGNOSIS — E785 Hyperlipidemia, unspecified: Secondary | ICD-10-CM

## 2019-07-11 DIAGNOSIS — E119 Type 2 diabetes mellitus without complications: Secondary | ICD-10-CM | POA: Diagnosis not present

## 2019-07-11 LAB — HM DIABETES EYE EXAM

## 2019-07-16 DIAGNOSIS — M5416 Radiculopathy, lumbar region: Secondary | ICD-10-CM | POA: Diagnosis not present

## 2019-07-16 DIAGNOSIS — M4325 Fusion of spine, thoracolumbar region: Secondary | ICD-10-CM | POA: Diagnosis not present

## 2019-07-16 DIAGNOSIS — M549 Dorsalgia, unspecified: Secondary | ICD-10-CM | POA: Diagnosis not present

## 2019-07-16 DIAGNOSIS — M2351 Chronic instability of knee, right knee: Secondary | ICD-10-CM | POA: Diagnosis not present

## 2019-07-20 ENCOUNTER — Other Ambulatory Visit: Payer: Self-pay

## 2019-07-20 ENCOUNTER — Ambulatory Visit (INDEPENDENT_AMBULATORY_CARE_PROVIDER_SITE_OTHER): Payer: PPO | Admitting: Primary Care

## 2019-07-20 ENCOUNTER — Encounter: Payer: Self-pay | Admitting: Primary Care

## 2019-07-20 VITALS — BP 126/80 | HR 78 | Temp 96.5°F | Ht 70.0 in | Wt 172.5 lb

## 2019-07-20 DIAGNOSIS — Z23 Encounter for immunization: Secondary | ICD-10-CM | POA: Diagnosis not present

## 2019-07-20 DIAGNOSIS — M62838 Other muscle spasm: Secondary | ICD-10-CM

## 2019-07-20 DIAGNOSIS — M4727 Other spondylosis with radiculopathy, lumbosacral region: Secondary | ICD-10-CM | POA: Diagnosis not present

## 2019-07-20 DIAGNOSIS — E785 Hyperlipidemia, unspecified: Secondary | ICD-10-CM | POA: Diagnosis not present

## 2019-07-20 DIAGNOSIS — E2839 Other primary ovarian failure: Secondary | ICD-10-CM | POA: Diagnosis not present

## 2019-07-20 DIAGNOSIS — Z1231 Encounter for screening mammogram for malignant neoplasm of breast: Secondary | ICD-10-CM | POA: Diagnosis not present

## 2019-07-20 DIAGNOSIS — Z0000 Encounter for general adult medical examination without abnormal findings: Secondary | ICD-10-CM | POA: Diagnosis not present

## 2019-07-20 DIAGNOSIS — E119 Type 2 diabetes mellitus without complications: Secondary | ICD-10-CM

## 2019-07-20 DIAGNOSIS — E1142 Type 2 diabetes mellitus with diabetic polyneuropathy: Secondary | ICD-10-CM

## 2019-07-20 DIAGNOSIS — D638 Anemia in other chronic diseases classified elsewhere: Secondary | ICD-10-CM

## 2019-07-20 DIAGNOSIS — I10 Essential (primary) hypertension: Secondary | ICD-10-CM

## 2019-07-20 DIAGNOSIS — G894 Chronic pain syndrome: Secondary | ICD-10-CM

## 2019-07-20 DIAGNOSIS — M792 Neuralgia and neuritis, unspecified: Secondary | ICD-10-CM | POA: Diagnosis not present

## 2019-07-20 LAB — POCT GLYCOSYLATED HEMOGLOBIN (HGB A1C): Hemoglobin A1C: 6.9 % — AB (ref 4.0–5.6)

## 2019-07-20 MED ORDER — ZOSTER VAC RECOMB ADJUVANTED 50 MCG/0.5ML IM SUSR: 0.5000 mL | Freq: Once | INTRAMUSCULAR | 1 refills | Status: AC

## 2019-07-20 NOTE — Assessment & Plan Note (Signed)
Doing well on gabapentin, celebrex, and baclofen. Continue same.

## 2019-07-20 NOTE — Progress Notes (Signed)
Subjective:    Patient ID: Megan [name], female    DOB: 26-Nov-1950, 68 y.o.   MRN: [id number]  HPI  Megan [name] is a 68 year old female who presents today for complete physical.  Immunizations: -Tetanus: Completed in 2016 -Influenza: Due today -Shingles: Never completed -Pneumonia: Completed Pneumovax in 2016  Diet: She endorses a healthy diet. Exercise: She is active, some walking at home with walker.   Eye exam: Completed in October 2020 Dental exam: No recent exam  Mammogram: No recent exam Dexa: never completed Colonoscopy: Never completed, has Cologuard Kit and will complete. Hep C Screen: Negative  BP Readings from Last 3 Encounters:  07/20/19 126/80  01/25/19 128/67  07/17/18 132/84      Review of Systems  Constitutional: Negative for unexpected weight change.  HENT: Negative for rhinorrhea.   Respiratory: Negative for cough and shortness of breath.   Cardiovascular: Negative for chest pain.  Gastrointestinal: Positive for constipation. Negative for diarrhea.       Chronic constipation, no changes  Genitourinary: Negative for difficulty urinating.  Musculoskeletal: Negative for arthralgias and myalgias.  Skin: Negative for rash.  Allergic/Immunologic: Negative for environmental allergies.  Neurological: Positive for numbness. Negative for dizziness and headaches.  Psychiatric/Behavioral:       Intermittent depression, overall able to manage on her own.        Past Medical History:  Diagnosis Date  . Arthritis    "hands, back" (07/26/2017)  . Dyspnea    W/ PHYS CONDITION   . GERD (gastroesophageal reflux disease)   . History of bronchitis    "not since I quit smoking" (07/26/2017)  . History of kidney stones   . History of shingles   . Hyperlipidemia    takes Fish Oil daily  . Hypertension   . Neuromuscular disorder (HCC)    tingling toes  . 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 Pneumonia 2009  . PONV (postoperative nausea  and vomiting)   . Restless leg   . Type 2 diabetes mellitus ([place])   . Uterine cancer ([place]) 1979   S/P hysterectomy  . Weakness    numbness and tingling in both feet r/t back     Social History   Socioeconomic History  . Marital status: Married    Spouse name: [name]  . Number of children: 1  . Years of education: 44  . Highest education level: Not on file  Occupational History  . Not on file  Social Needs  . Financial resource strain: Not on file  . Food insecurity    Worry: Not on file    Inability: Not on file  . Transportation needs    Medical: Not on file    Non-medical: Not on file  Tobacco Use  . Smoking status: Former Smoker    Packs/day: 1.50    Years: 37.00    Pack years: 55.50    Types: Cigarettes    Quit date: 05/13/2006    Years since quitting: 13.2  . Smokeless tobacco: Never Used  Substance and Sexual Activity  . Alcohol use: Yes    Comment: 07/26/2017 "glass of wine once/wk if not on RX"  . Drug use: No  . Sexual activity: Not Currently    Birth control/protection: Surgical  Lifestyle  . Physical activity    Days per week: Not on file    Minutes per session: Not on file  . Stress: Not on file  Relationships  . Social connections  Talks on phone: Not on file    Gets together: Not on file    Attends religious service: Not on file    Active member of club or organization: Not on file    Attends meetings of clubs or organizations: Not on file    Relationship status: Not on file  . Intimate partner violence    Fear of current or ex partner: Not on file    Emotionally abused: Not on file    Physically abused: Not on file    Forced sexual activity: Not on file  Other Topics Concern  . Not on file  Social History Narrative   Lives w/ husband   Married.   1 child, 1 grandchildren.   Retired. Once worked for [employer].   Enjoys reading.     Past Surgical History:  Procedure Laterality Date  . ABDOMINAL EXPOSURE N/A 07/26/2017    Procedure: ABDOMINAL EXPOSURE;  Surgeon: Angelia Mould, MD;  Location: [place];  Service: Vascular;  Laterality: N/A;  . ANTERIOR CERVICAL DECOMP/DISCECTOMY FUSION  2001  . ANTERIOR LUMBAR FUSION N/A 07/26/2017   Procedure: Lumbar five-Sacral one Anterior lumbar interbody fusion with Dr. Deitra Mayo for approach;  Surgeon: Ditty, Kevan Ny, MD;  Location: [place];  Service: Neurosurgery;  Laterality: N/A;  . APPLICATION OF ROBOTIC ASSISTANCE FOR SPINAL PROCEDURE  12/19/2016   Procedure: APPLICATION OF ROBOTIC ASSISTANCE FOR SPINAL PROCEDURE;  Surgeon: Kevan Ny Ditty, MD;  Location: [place];  Service: Neurosurgery;;  . APPLICATION OF ROBOTIC ASSISTANCE FOR SPINAL PROCEDURE N/A 07/26/2017   Procedure: APPLICATION OF ROBOTIC ASSISTANCE FOR SPINAL PROCEDURE;  Surgeon: Ditty, Kevan Ny, MD;  Location: [place];  Service: Neurosurgery;  Laterality: N/A;  . APPLICATION OF ROBOTIC ASSISTANCE FOR SPINAL PROCEDURE N/A 09/02/2017   Procedure: APPLICATION OF ROBOTIC ASSISTANCE FOR SPINAL PROCEDURE;  Surgeon: Ditty, Kevan Ny, MD;  Location: [place];  Service: Neurosurgery;  Laterality: N/A;  . BACK SURGERY    . BLADDER SUSPENSION  1991   tack  . INGUINAL HERNIA REPAIR Right 1991  . LUMBAR FUSION  2016; 2017; 07/26/2017   L4-5; L2-3; L5-S1  . POSTERIOR LUMBAR FUSION 4 WITH HARDWARE REMOVAL N/A 09/02/2017   Procedure: Lumbar three-four redo laminectomy; Repositioning of Left Sacral two screw; Extension of lumbar fusion to Thoracic twelve;  Surgeon: Ditty, Kevan Ny, MD;  Location: [facility];  Service: Neurosurgery;  Laterality: N/A;  . TONSILLECTOMY AND ADENOIDECTOMY  1959  . TUMOR EXCISION     WERTHIN'S TUMORS BOTH SIDES OF NECK  . VAGINAL HYSTERECTOMY  1979    Family History  Problem Relation Age of Onset  . Diabetes Mother   . Dementia Mother   . Cirrhosis Mother        Non alcoholic  . COPD Father   . Diabetes Brother     Allergies  Allergen Reactions  . Shellfish  Allergy Anaphylaxis, Swelling and Other (See Comments)    Tongue swells  . Oxycodone Nausea Only  . Penicillins Rash and Other (See Comments)    Has patient had a PCN reaction causing immediate rash, facial/tongue/throat swelling, SOB or lightheadedness with hypotension: Yes Has patient had a PCN reaction causing severe rash involving mucus membranes or skin necrosis: No Has patient had a PCN reaction that required hospitalization No Has patient had a PCN reaction occurring within the last 10 years: No If all of the above answers are "NO", then may proceed with Cephalosporin use.    Current Outpatient Medications on File  Prior to Visit  Medication Sig Dispense Refill  . atorvastatin (LIPITOR) 20 MG tablet TAKE 1 TABLET(20 MG) BY MOUTH EVERY EVENING FOR CHOLESTEROL 90 tablet 1  . baclofen (LIORESAL) 10 MG tablet TAKE 1 TABLET(10 MG) BY MOUTH THREE TIMES DAILY AS NEEDED FOR MUSCLE SPASMS 180 tablet 2  . Biotin 5000 MCG CAPS Take 5,000 mcg by mouth every morning.     . celecoxib (CELEBREX) 200 MG capsule TAKE 1 CAPSULE(200 MG) BY MOUTH TWICE DAILY AS NEEDED FOR MODERATE PAIN 180 capsule 0  . cholecalciferol (VITAMIN D) 1000 units tablet Take 2 tablets (2,000 Units total) by mouth daily after lunch. (Patient taking differently: Take 1,000 Units by mouth daily after lunch. ) 30 tablet 0  . Coenzyme Q10 (COQ10) 100 MG CAPS Take 100 mg daily by mouth.     . esomeprazole (NEXIUM 24HR) 20 MG capsule Take 1 capsule (20 mg total) by mouth daily at 12 noon. 90 capsule 1  . gabapentin (NEURONTIN) 300 MG capsule TAKE ONE CAPSULE BY MOUTH THREE TIMES DAILY 270 capsule 1  . Garlic 379 MG CAPS Take 500 mg 2 (two) times daily by mouth.    Marland Kitchen lisinopril (ZESTRIL) 10 MG tablet TAKE 1 TABLET(10 MG) BY MOUTH DAILY FOR BLOOD PRESSURE 90 tablet 1  . metFORMIN (GLUCOPHAGE) 500 MG tablet TAKE 1 TABLET BY MOUTH TWICE DAILY 180 tablet 0  . oxybutynin (OXYTROL) 3.9 MG/24HR Place 1 patch onto the skin 5 days.     Marland Kitchen  senna-docusate (SENOKOT-S) 8.6-50 MG tablet Take 2 tablets by mouth 2 (two) times daily. (Patient taking differently: Take 1-2 tablets by mouth daily as needed for mild constipation. )     No current facility-administered medications on file prior to visit.     BP 126/80   Pulse 78   Temp (!) 96.5 F (35.8 C) (Temporal)   Ht 5' 10"  (1.778 m)   Wt 172 lb 8 oz (78.2 kg)   SpO2 98%   BMI 24.75 kg/m    Objective:   Physical Exam  Constitutional: She is oriented to person, place, and time. She appears well-nourished.  HENT:  Right Ear: Tympanic membrane and ear canal normal.  Left Ear: Tympanic membrane and ear canal normal.  Mouth/Throat: Oropharynx is clear and moist.  Eyes: Pupils are equal, round, and reactive to light. EOM are normal.  Neck: Neck supple.  Cardiovascular: Normal rate and regular rhythm.  Respiratory: Effort normal and breath sounds normal.  GI: Soft. Bowel sounds are normal. There is no abdominal tenderness.  Musculoskeletal:     Comments: Overall decrease in ROM with ambulation. Uses walker  Neurological: She is alert and oriented to person, place, and time.  Skin: Skin is warm and dry.  Psychiatric: She has a normal mood and affect.           Assessment & Plan:

## 2019-07-20 NOTE — Patient Instructions (Signed)
Stop by the lab prior to leaving today. I will notify you of your results once received.   Take the shingles vaccine to your pharmacy for administration, do tell them that you had the high dose flu shot today.  Complete the Cologuard Kit as discussed.  Call the breast center to schedule your mammogram and bone density tests.  Continue to work on a healthy diet. Continue to work on daily activity.  Please schedule a follow up appointment in 6 months for diabetes check.   It was a pleasure to see you today!   Preventive Care 66 Years and Older, Female Preventive care refers to lifestyle choices and visits with your health care provider that can promote health and wellness. This includes:  A yearly physical exam. This is also called an annual well check.  Regular dental and eye exams.  Immunizations.  Screening for certain conditions.  Healthy lifestyle choices, such as diet and exercise. What can I expect for my preventive care visit? Physical exam Your health care provider will check:  Height and weight. These may be used to calculate body mass index (BMI), which is a measurement that tells if you are at a healthy weight.  Heart rate and blood pressure.  Your skin for abnormal spots. Counseling Your health care provider may ask you questions about:  Alcohol, tobacco, and drug use.  Emotional well-being.  Home and relationship well-being.  Sexual activity.  Eating habits.  History of falls.  Memory and ability to understand (cognition).  Work and work Statistician.  Pregnancy and menstrual history. What immunizations do I need?  Influenza (flu) vaccine  This is recommended every year. Tetanus, diphtheria, and pertussis (Tdap) vaccine  You may need a Td booster every 10 years. Varicella (chickenpox) vaccine  You may need this vaccine if you have not already been vaccinated. Zoster (shingles) vaccine  You may need this after age 69. Pneumococcal  conjugate (PCV13) vaccine  One dose is recommended after age 67. Pneumococcal polysaccharide (PPSV23) vaccine  One dose is recommended after age 4. Measles, mumps, and rubella (MMR) vaccine  You may need at least one dose of MMR if you were born in 1957 or later. You may also need a second dose. Meningococcal conjugate (MenACWY) vaccine  You may need this if you have certain conditions. Hepatitis A vaccine  You may need this if you have certain conditions or if you travel or work in places where you may be exposed to hepatitis A. Hepatitis B vaccine  You may need this if you have certain conditions or if you travel or work in places where you may be exposed to hepatitis B. Haemophilus influenzae type b (Hib) vaccine  You may need this if you have certain conditions. You may receive vaccines as individual doses or as more than one vaccine together in one shot (combination vaccines). Talk with your health care provider about the risks and benefits of combination vaccines. What tests do I need? Blood tests  Lipid and cholesterol levels. These may be checked every 5 years, or more frequently depending on your overall health.  Hepatitis C test.  Hepatitis B test. Screening  Lung cancer screening. You may have this screening every year starting at age 50 if you have a 30-pack-year history of smoking and currently smoke or have quit within the past 15 years.  Colorectal cancer screening. All adults should have this screening starting at age 69 and continuing until age 68. Your health care provider may recommend screening  at age 50 if you are at increased risk. You will have tests every 1-10 years, depending on your results and the type of screening test.  Diabetes screening. This is done by checking your blood sugar (glucose) after you have not eaten for a while (fasting). You may have this done every 1-3 years.  Mammogram. This may be done every 1-2 years. Talk with your health care  provider about how often you should have regular mammograms.  BRCA-related cancer screening. This may be done if you have a family history of breast, ovarian, tubal, or peritoneal cancers. Other tests  Sexually transmitted disease (STD) testing.  Bone density scan. This is done to screen for osteoporosis. You may have this done starting at age 30. Follow these instructions at home: Eating and drinking  Eat a diet that includes fresh fruits and vegetables, whole grains, lean protein, and low-fat dairy products. Limit your intake of foods with high amounts of sugar, saturated fats, and salt.  Take vitamin and mineral supplements as recommended by your health care provider.  Do not drink alcohol if your health care provider tells you not to drink.  If you drink alcohol: ? Limit how much you have to 0-1 drink a day. ? Be aware of how much alcohol is in your drink. In the [place] one drink equals one 12 oz bottle of beer (355 mL), one 5 oz glass of wine (148 mL), or one 1 oz glass of hard liquor (44 mL). Lifestyle  Take daily care of your teeth and gums.  Stay active. Exercise for at least 30 minutes on 5 or more days each week.  Do not use any products that contain nicotine or tobacco, such as cigarettes, e-cigarettes, and chewing tobacco. If you need help quitting, ask your health care provider.  If you are sexually active, practice safe sex. Use a condom or other form of protection in order to prevent STIs (sexually transmitted infections).  Talk with your health care provider about taking a low-dose aspirin or statin. What's next?  Go to your health care provider once a year for a well check visit.  Ask your health care provider how often you should have your eyes and teeth checked.  Stay up to date on all vaccines. This information is not intended to replace advice given to you by your health care provider. Make sure you discuss any questions you have with your health care  provider. Document Released: 09/26/2015 Document Revised: 08/24/2018 Document Reviewed: 08/24/2018 Elsevier Patient Education  2020 [employer].

## 2019-07-22 ENCOUNTER — Encounter: Payer: Self-pay | Admitting: Primary Care

## 2019-07-22 DIAGNOSIS — Z0000 Encounter for general adult medical examination without abnormal findings: Secondary | ICD-10-CM | POA: Insufficient documentation

## 2019-07-22 DIAGNOSIS — Z5111 Encounter for antineoplastic chemotherapy: Secondary | ICD-10-CM | POA: Insufficient documentation

## 2019-07-22 HISTORY — DX: Encounter for antineoplastic chemotherapy: Z51.11

## 2019-07-22 NOTE — Assessment & Plan Note (Signed)
Stable in the office, continue current regimen.

## 2019-07-22 NOTE — Assessment & Plan Note (Signed)
Chronic. Strength and mobility slowly improving. Continue baclofen, gabapentin, celebrex.  Continue home PT exercises.

## 2019-07-22 NOTE — Assessment & Plan Note (Signed)
Doing well overall on baclofen. Continue same.

## 2019-07-22 NOTE — Assessment & Plan Note (Signed)
Chronic, doing well overall with current regimen. Continue same.

## 2019-07-22 NOTE — Assessment & Plan Note (Signed)
Recent CBC unremarkable. Continue to monitor.

## 2019-07-22 NOTE — Assessment & Plan Note (Signed)
Immunizations UTD. Rx for Shingrix provided. Mammogram and Bone Density overdue, ordered. She will return Cologuard Kit. Encouraged a healthy diet and regular exercise. Exam stable.  Labs from May 2020 reviewed, A1C pending.

## 2019-07-22 NOTE — Assessment & Plan Note (Signed)
LDL stable from recent labs. Continue atorvastatin.

## 2019-07-22 NOTE — Assessment & Plan Note (Signed)
A1C of 6.9 on recent labs. Encouraged to work on diet, start exercising regularly. Continue current regimen of Metformin for now.  Follow up in 6 months.

## 2019-07-22 NOTE — Assessment & Plan Note (Signed)
Chronic, doing well on gabapentin and Celebrex. Continue same.

## 2019-08-23 ENCOUNTER — Other Ambulatory Visit: Payer: Self-pay | Admitting: Primary Care

## 2019-08-23 DIAGNOSIS — M4714 Other spondylosis with myelopathy, thoracic region: Secondary | ICD-10-CM

## 2019-08-23 DIAGNOSIS — G894 Chronic pain syndrome: Secondary | ICD-10-CM

## 2019-08-23 DIAGNOSIS — E119 Type 2 diabetes mellitus without complications: Secondary | ICD-10-CM

## 2019-08-23 DIAGNOSIS — M4727 Other spondylosis with radiculopathy, lumbosacral region: Secondary | ICD-10-CM

## 2019-09-15 DIAGNOSIS — Z1211 Encounter for screening for malignant neoplasm of colon: Secondary | ICD-10-CM | POA: Diagnosis not present

## 2019-09-19 ENCOUNTER — Other Ambulatory Visit: Payer: Self-pay | Admitting: Primary Care

## 2019-09-19 DIAGNOSIS — M62838 Other muscle spasm: Secondary | ICD-10-CM

## 2019-09-19 MED ORDER — BACLOFEN 10 MG PO TABS: ORAL_TABLET | ORAL | 0 refills | Status: DC

## 2019-10-24 ENCOUNTER — Other Ambulatory Visit: Payer: PPO

## 2019-10-24 ENCOUNTER — Ambulatory Visit: Payer: PPO

## 2019-11-13 ENCOUNTER — Other Ambulatory Visit: Payer: Self-pay | Admitting: Primary Care

## 2019-11-13 DIAGNOSIS — M4727 Other spondylosis with radiculopathy, lumbosacral region: Secondary | ICD-10-CM

## 2019-11-13 DIAGNOSIS — G894 Chronic pain syndrome: Secondary | ICD-10-CM

## 2019-11-19 ENCOUNTER — Other Ambulatory Visit: Payer: Self-pay | Admitting: Primary Care

## 2019-11-19 DIAGNOSIS — E119 Type 2 diabetes mellitus without complications: Secondary | ICD-10-CM

## 2019-11-19 DIAGNOSIS — E785 Hyperlipidemia, unspecified: Secondary | ICD-10-CM

## 2019-11-19 DIAGNOSIS — I10 Essential (primary) hypertension: Secondary | ICD-10-CM

## 2019-12-12 ENCOUNTER — Telehealth: Payer: Self-pay

## 2019-12-12 NOTE — Telephone Encounter (Signed)
[facility] Night - Client Nonclinical Telephone Record AccessNurse Client [facility] Night - Client Client Site [place] Physician Alma Friendly - NP Contact Type Call Who Is Calling Patient / Member / Family / Caregiver Caller Name Megan [name] Caller Phone Number [phone] Call Type Message Only Information Provided Reason for Call Returning a Call from the Office Initial [place] states that she received a message from [place] at the office and she is not sure what the call is regarding. Additional Comment Caller states that she is just returning the call and just realized it is after the office closed. She doesn't believe the call was regarding anything urgent so if Raquel Sarna would like to call her back tomorrow that would be fine. Disp. Time Disposition Final User 12/11/2019 5:22:38 PM General Information Provided Yes Wynema Birch Call Closed By: Wynema Birch Transaction Date/Time: 12/11/2019 5:20:22 PM (ET)

## 2019-12-12 NOTE — Telephone Encounter (Signed)
Returned patients call. Just needed to reschedule 5/7 appt.

## 2019-12-27 ENCOUNTER — Ambulatory Visit (INDEPENDENT_AMBULATORY_CARE_PROVIDER_SITE_OTHER): Payer: PPO

## 2019-12-27 VITALS — Wt 174.0 lb

## 2019-12-27 DIAGNOSIS — Z0000 Encounter for general adult medical examination without abnormal findings: Secondary | ICD-10-CM

## 2019-12-27 NOTE — Progress Notes (Signed)
PCP notes:  Health Maintenance: Prevnar 13- due Foot exam- due Colonoscopy- declined   Abnormal Screenings: none   Patient concerns: none   Nurse concerns: none   Next PCP appt.: 01/22/2020 @ 3:20 pm

## 2019-12-27 NOTE — Progress Notes (Signed)
Subjective:   Megan [name] is a 69 y.o. female who presents for an Initial Medicare Annual Wellness Visit.  Review of Systems: N/A      This visit is being conducted through telemedicine via telephone at the nurse health advisor's home address due to the COVID-19 pandemic. This patient has given me verbal consent via doximity to conduct this visit, patient states they are participating from their home address. Patient and myself are on the telephone call. There is no referral for this visit. Some vital signs may be absent or patient reported.    Patient identification: identified by name, DOB, and current address.    Cardiac Risk Factors include: advanced age (>31men, >47 women);diabetes mellitus;hypertension;dyslipidemia     Objective:    Today's Vitals   12/27/19 1102  Weight: 174 lb (78.9 kg)   Body mass index is 24.97 kg/m.  Advanced Directives 12/27/2019 05/23/2018 09/07/2017 08/29/2017 07/28/2017 07/26/2017 07/26/2017  Does Patient Have a Medical Advance Directive? No No No No No No No  Would patient like information on creating a medical advance directive? No - Patient declined No - Patient declined No - Patient declined No - Patient declined Yes (Inpatient - patient requests chaplain consult to create a medical advance directive) No - Patient declined -    Current Medications (verified) Outpatient Encounter Medications as of 12/27/2019  Medication Sig  . atorvastatin (LIPITOR) 20 MG tablet TAKE 1 TABLET(20 MG) BY MOUTH EVERY EVENING FOR CHOLESTEROL  . baclofen (LIORESAL) 10 MG tablet TAKE 1 TABLET(10 MG) BY MOUTH THREE TIMES DAILY AS NEEDED FOR MUSCLE SPASMS  . Biotin 5000 MCG CAPS Take 5,000 mcg by mouth every morning.   . celecoxib (CELEBREX) 200 MG capsule TAKE 1 CAPSULE(200 MG) BY MOUTH TWICE DAILY AS NEEDED FOR MODERATE PAIN  . cholecalciferol (VITAMIN D) 1000 units tablet Take 2 tablets (2,000 Units total) by mouth daily after lunch. (Patient taking differently:  Take 1,000 Units by mouth daily after lunch. )  . Coenzyme Q10 (COQ10) 100 MG CAPS Take 100 mg daily by mouth.   . esomeprazole (NEXIUM 24HR) 20 MG capsule Take 1 capsule (20 mg total) by mouth daily at 12 noon.  . gabapentin (NEURONTIN) 300 MG capsule TAKE ONE CAPSULE BY MOUTH THREE TIMES DAILY  . Garlic 481 MG CAPS Take 500 mg 2 (two) times daily by mouth.  Marland Kitchen lisinopril (ZESTRIL) 10 MG tablet TAKE 1 TABLET(10 MG) BY MOUTH DAILY FOR BLOOD PRESSURE  . metFORMIN (GLUCOPHAGE) 500 MG tablet TAKE 1 TABLET BY MOUTH TWICE DAILY  . oxybutynin (OXYTROL) 3.9 MG/24HR Place 1 patch onto the skin 5 days.   Marland Kitchen senna-docusate (SENOKOT-S) 8.6-50 MG tablet Take 2 tablets by mouth 2 (two) times daily. (Patient taking differently: Take 1-2 tablets by mouth daily as needed for mild constipation. )   No facility-administered encounter medications on file as of 12/27/2019.    Allergies (verified) Shellfish allergy, Oxycodone, and Penicillins   History: Past Medical History:  Diagnosis Date  . Arthritis    "hands, back" (07/26/2017)  . Constipation due to pain medication   . Dyspnea    W/ PHYS CONDITION   . GERD (gastroesophageal reflux disease)   . History of bronchitis    "not since I quit smoking" (07/26/2017)  . History of kidney stones   . History of shingles   . Hyperlipidemia    takes Fish Oil daily  . Hypertension   . Neuromuscular disorder (HCC)    tingling toes  . 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 Pneumonia 2009  . PONV (postoperative nausea and vomiting)   . Restless leg   . Type 2 diabetes mellitus ([place])   . Uterine cancer ([place]) 1979   S/P hysterectomy  . Weakness    numbness and tingling in both feet r/t back   Past Surgical History:  Procedure Laterality Date  . ABDOMINAL EXPOSURE N/A 07/26/2017   Procedure: ABDOMINAL EXPOSURE;  Surgeon: Angelia Mould, MD;  Location: [facility];  Service: Vascular;  Laterality: N/A;  . ANTERIOR CERVICAL DECOMP/DISCECTOMY  FUSION  2001  . ANTERIOR LUMBAR FUSION N/A 07/26/2017   Procedure: Lumbar five-Sacral one Anterior lumbar interbody fusion with Dr. Deitra Mayo for approach;  Surgeon: Ditty, Kevan Ny, MD;  Location: [facility];  Service: Neurosurgery;  Laterality: N/A;  . APPLICATION OF ROBOTIC ASSISTANCE FOR SPINAL PROCEDURE  12/19/2016   Procedure: APPLICATION OF ROBOTIC ASSISTANCE FOR SPINAL PROCEDURE;  Surgeon: Kevan Ny Ditty, MD;  Location: [facility];  Service: Neurosurgery;;  . APPLICATION OF ROBOTIC ASSISTANCE FOR SPINAL PROCEDURE N/A 07/26/2017   Procedure: APPLICATION OF ROBOTIC ASSISTANCE FOR SPINAL PROCEDURE;  Surgeon: Ditty, Kevan Ny, MD;  Location: [facility];  Service: Neurosurgery;  Laterality: N/A;  . APPLICATION OF ROBOTIC ASSISTANCE FOR SPINAL PROCEDURE N/A 09/02/2017   Procedure: APPLICATION OF ROBOTIC ASSISTANCE FOR SPINAL PROCEDURE;  Surgeon: Ditty, Kevan Ny, MD;  Location: [facility];  Service: Neurosurgery;  Laterality: N/A;  . BACK SURGERY    . BLADDER SUSPENSION  1991   tack  . INGUINAL HERNIA REPAIR Right 1991  . LUMBAR FUSION  2016; 2017; 07/26/2017   L4-5; L2-3; L5-S1  . POSTERIOR LUMBAR FUSION 4 WITH HARDWARE REMOVAL N/A 09/02/2017   Procedure: Lumbar three-four redo laminectomy; Repositioning of Left Sacral two screw; Extension of lumbar fusion to Thoracic twelve;  Surgeon: Ditty, Kevan Ny, MD;  Location: [facility];  Service: Neurosurgery;  Laterality: N/A;  . TONSILLECTOMY AND ADENOIDECTOMY  1959  . TUMOR EXCISION     WERTHIN'S TUMORS BOTH SIDES OF NECK  . VAGINAL HYSTERECTOMY  1979   Family History  Problem Relation Age of Onset  . Diabetes Mother   . Dementia Mother   . Cirrhosis Mother        Non alcoholic  . COPD Father   . Diabetes Brother    Social History   Socioeconomic History  . Marital status: Married    Spouse name: [name]  . Number of children: 1  . Years of education: 75  . Highest education level: Not on file  Occupational History  .  Not on file  Tobacco Use  . Smoking status: Former Smoker    Packs/day: 1.50    Years: 37.00    Pack years: 55.50    Types: Cigarettes    Quit date: 05/13/2006    Years since quitting: 13.6  . Smokeless tobacco: Never Used  Substance and Sexual Activity  . Alcohol use: Yes    Comment: 07/26/2017 "glass of wine once/wk if not on RX"  . Drug use: No  . Sexual activity: Not Currently    Birth control/protection: Surgical  Other Topics Concern  . Not on file  Social History Narrative   Lives w/ husband   Married.   1 child, 1 grandchildren.   Retired. Once worked for [employer].   Enjoys reading.    Social Determinants of Health   Financial Resource Strain: Low Risk   . Difficulty of Paying Living Expenses: Not hard at  all  Food Insecurity: No Food Insecurity  . Worried About Charity fundraiser in the Last Year: Never true  . Ran Out of Food in the Last Year: Never true  Transportation Needs: No Transportation Needs  . Lack of Transportation (Medical): No  . Lack of Transportation (Non-Medical): No  Physical Activity: Inactive  . Days of Exercise per Week: 0 days  . Minutes of Exercise per Session: 0 min  Stress: No Stress Concern Present  . Feeling of Stress : Not at all  Social Connections:   . Frequency of Communication with Friends and Family:   . Frequency of Social Gatherings with Friends and Family:   . Attends Religious Services:   . Active Member of Clubs or Organizations:   . Attends Archivist Meetings:   Marland Kitchen Marital Status:     Tobacco Counseling Counseling given: Not Answered   Clinical Intake:  Pre-visit preparation completed: Yes  Pain : No/denies pain     Nutritional Risks: Nausea/ vomitting/ diarrhea(nausea sometimes) Diabetes: Yes CBG done?: No Did pt. bring in CBG monitor from home?: No  How often do you need to have someone help you when you read instructions, pamphlets, or other written materials from your doctor or pharmacy?:  1 - Never What is the last grade level you completed in school?: 12th  Interpreter Needed?: No  Information entered by :: CJohnson, LPN   Activities of Daily Living In your present state of health, do you have any difficulty performing the following activities: 12/27/2019  Hearing? N  Vision? N  Difficulty concentrating or making decisions? N  Walking or climbing stairs? N  Dressing or bathing? Y  Comment husband helps her  Doing errands, shopping? N  Preparing Food and eating ? N  Using the Toilet? N  In the past six months, have you accidently leaked urine? Y  Comment wears a pad daily  Do you have problems with loss of bowel control? N  Managing your Medications? N  Managing your Finances? N  Housekeeping or managing your Housekeeping? N  Some recent data might be hidden     Immunizations and Health Maintenance Immunization History  Administered Date(s) Administered  . Fluad Quad(high Dose 65+) 07/20/2019  . Influenza, High Dose Seasonal PF 06/30/2018  . Influenza,inj,Quad PF,6+ Mos 07/01/2017  . Influenza-Unspecified 09/27/2016  . Pneumococcal Polysaccharide-23 04/07/2015  . Tdap 04/07/2015   Health Maintenance Due  Topic Date Due  . MAMMOGRAM  Never done  . DEXA SCAN  Never done  . PNA vac Low Risk Adult (1 of 2 - PCV13) 04/26/2018  . FOOT EXAM  01/25/2019    Patient Care Team: Pleas Koch, NP as PCP - General (Internal Medicine) Kristeen Miss, MD as Consulting Physician (Neurosurgery)  Indicate any recent Medical Services you may have received from other than [facility] providers in the past year (date may be approximate).     Assessment:   This is a routine wellness examination for Azie.  Hearing/Vision screen  Hearing Screening   125Hz  250Hz  500Hz  1000Hz  2000Hz  3000Hz  4000Hz  6000Hz  8000Hz   Right ear:           Left ear:           Vision Screening Comments: Patient gets annual eye exams   Dietary issues and exercise activities discussed:  Current Exercise Habits: The patient does not participate in regular exercise at present, Exercise limited by: None identified  Goals    . Patient Stated  12/27/2019, I will maintain and continue medications as prescribed.       Depression Screen PHQ 2/9 Scores 12/27/2019 09/23/2017 09/23/2017 01/07/2017  PHQ - 2 Score 2 1 0 0  PHQ- 9 Score 2 5 - 1    Fall Risk Fall Risk  12/27/2019 04/11/2019 11/21/2017 09/23/2017 04/22/2017  Falls in the past year? 0 0 No No No  Comment - Emmi Telephone Survey: data to providers prior to load - - -  Number falls in past yr: 0 - - - -  Injury with Fall? 0 - - - -  Comment - - - - -  Risk for fall due to : Medication side effect - - - -  Follow up Falls evaluation completed;Falls prevention discussed - - - -    Is the patient's home free of loose throw rugs in walkways, pet beds, electrical cords, etc?   yes      Grab bars in the bathroom? yes      Handrails on the stairs?   yes      Adequate lighting?   yes  Timed Get Up and Go Performed: N/A  Cognitive Function: MMSE - Mini Mental State Exam 12/27/2019  Orientation to time 5  Orientation to Place 5  Registration 3  Attention/ Calculation 5  Recall 3  Language- repeat 1       Mini Cog  Mini-Cog screen was completed. Maximum score is 22. A value of 0 denotes this part of the MMSE was not completed or the patient failed this part of the Mini-Cog screening.  Screening Tests Health Maintenance  Topic Date Due  . MAMMOGRAM  Never done  . DEXA SCAN  Never done  . PNA vac Low Risk Adult (1 of 2 - PCV13) 04/26/2018  . FOOT EXAM  01/25/2019  . COLONOSCOPY  12/27/2023 (Originally 04/27/2003)  . HEMOGLOBIN A1C  01/17/2020  . INFLUENZA VACCINE  04/13/2020  . OPHTHALMOLOGY EXAM  07/10/2020  . TETANUS/TDAP  04/06/2025  . Hepatitis C Screening  Completed    Qualifies for Shingles Vaccine: Yes   Cancer Screenings: Lung: Low Dose CT Chest recommended if Age 33-80 years, 30 pack-year currently  smoking OR have quit w/in 15 years. Patient does not qualify. Breast: Up to date on Mammogram: scheduled for 01/02/2020   Up to date of Bone Density/Dexa: scheduled for 01/02/2020 Colorectal: declined  Additional Screenings:  Hepatitis C Screening: due     Plan:    Patient will maintain and continue medications as prescribed.   I have personally reviewed and noted the following in the patient's chart:   . Medical and social history . Use of alcohol, tobacco or illicit drugs  . Current medications and supplements . Functional ability and status . Nutritional status . Physical activity . Advanced directives . List of other physicians . Hospitalizations, surgeries, and ER visits in previous 12 months . Vitals . Screenings to include cognitive, depression, and falls . Referrals and appointments  In addition, I have reviewed and discussed with patient certain preventive protocols, quality metrics, and best practice recommendations. A written personalized care plan for preventive services as well as general preventive health recommendations were provided to patient.     Andrez Grime, LPN   [id number]

## 2019-12-27 NOTE — Patient Instructions (Signed)
Megan [name] , Thank you for taking time to come for your Medicare Wellness Visit. I appreciate your ongoing commitment to your health goals. Please review the following plan we discussed and let me know if I can assist you in the future.   Screening recommendations/referrals: Colonoscopy: declined Mammogram: scheduled for 01/02/2020 Bone Density: scheduled for 01/02/2020 Recommended yearly ophthalmology/optometry visit for glaucoma screening and checkup Recommended yearly dental visit for hygiene and checkup  Vaccinations: Influenza vaccine: Up to date, completed 07/20/2019 Pneumococcal vaccine: due Tdap vaccine: Up to date, completed 04/07/2015 Shingles vaccine: discussed    Advanced directives: Advance directive discussed with you today. Even though you declined this today please call our office should you change your mind and we can give you the proper paperwork for you to fill out.  Conditions/risks identified: Diabetes, Hypertension, Hyperlipidemia  Next appointment: 01/22/2020 @ 3:20 pm    Preventive Care 67 Years and Older, Female Preventive care refers to lifestyle choices and visits with your health care provider that can promote health and wellness. What does preventive care include?  A yearly physical exam. This is also called an annual well check.  Dental exams once or twice a year.  Routine eye exams. Ask your health care provider how often you should have your eyes checked.  Personal lifestyle choices, including:  Daily care of your teeth and gums.  Regular physical activity.  Eating a healthy diet.  Avoiding tobacco and drug use.  Limiting alcohol use.  Practicing safe sex.  Taking low-dose aspirin every day.  Taking vitamin and mineral supplements as recommended by your health care provider. What happens during an annual well check? The services and screenings done by your health care provider during your annual well check will depend on your age, overall  health, lifestyle risk factors, and family history of disease. Counseling  Your health care provider may ask you questions about your:  Alcohol use.  Tobacco use.  Drug use.  Emotional well-being.  Home and relationship well-being.  Sexual activity.  Eating habits.  History of falls.  Memory and ability to understand (cognition).  Work and work Statistician.  Reproductive health. Screening  You may have the following tests or measurements:  Height, weight, and BMI.  Blood pressure.  Lipid and cholesterol levels. These may be checked every 5 years, or more frequently if you are over 72 years old.  Skin check.  Lung cancer screening. You may have this screening every year starting at age 49 if you have a 30-pack-year history of smoking and currently smoke or have quit within the past 15 years.  Fecal occult blood test (FOBT) of the stool. You may have this test every year starting at age 33.  Flexible sigmoidoscopy or colonoscopy. You may have a sigmoidoscopy every 5 years or a colonoscopy every 10 years starting at age 100.  Hepatitis C blood test.  Hepatitis B blood test.  Sexually transmitted disease (STD) testing.  Diabetes screening. This is done by checking your blood sugar (glucose) after you have not eaten for a while (fasting). You may have this done every 1-3 years.  Bone density scan. This is done to screen for osteoporosis. You may have this done starting at age 2.  Mammogram. This may be done every 1-2 years. Talk to your health care provider about how often you should have regular mammograms. Talk with your health care provider about your test results, treatment options, and if necessary, the need for more tests. Vaccines  Your health  care provider may recommend certain vaccines, such as:  Influenza vaccine. This is recommended every year.  Tetanus, diphtheria, and acellular pertussis (Tdap, Td) vaccine. You may need a Td booster every 10  years.  Zoster vaccine. You may need this after age 31.  Pneumococcal 13-valent conjugate (PCV13) vaccine. One dose is recommended after age 22.  Pneumococcal polysaccharide (PPSV23) vaccine. One dose is recommended after age 58. Talk to your health care provider about which screenings and vaccines you need and how often you need them. This information is not intended to replace advice given to you by your health care provider. Make sure you discuss any questions you have with your health care provider. Document Released: 09/26/2015 Document Revised: 05/19/2016 Document Reviewed: 07/01/2015 Elsevier Interactive Patient Education  2017 [place] Prevention in the Home Falls can cause injuries. They can happen to people of all ages. There are many things you can do to make your home safe and to help prevent falls. What can I do on the outside of my home?  Regularly fix the edges of walkways and driveways and fix any cracks.  Remove anything that might make you trip as you walk through a door, such as a raised step or threshold.  Trim any bushes or trees on the path to your home.  Use bright outdoor lighting.  Clear any walking paths of anything that might make someone trip, such as rocks or tools.  Regularly check to see if handrails are loose or broken. Make sure that both sides of any steps have handrails.  Any raised decks and porches should have guardrails on the edges.  Have any leaves, snow, or ice cleared regularly.  Use sand or salt on walking paths during winter.  Clean up any spills in your garage right away. This includes oil or grease spills. What can I do in the bathroom?  Use night lights.  Install grab bars by the toilet and in the tub and shower. Do not use towel bars as grab bars.  Use non-skid mats or decals in the tub or shower.  If you need to sit down in the shower, use a plastic, non-slip stool.  Keep the floor dry. Clean up any water that  spills on the floor as soon as it happens.  Remove soap buildup in the tub or shower regularly.  Attach bath mats securely with double-sided non-slip rug tape.  Do not have throw rugs and other things on the floor that can make you trip. What can I do in the bedroom?  Use night lights.  Make sure that you have a light by your bed that is easy to reach.  Do not use any sheets or blankets that are too big for your bed. They should not hang down onto the floor.  Have a firm chair that has side arms. You can use this for support while you get dressed.  Do not have throw rugs and other things on the floor that can make you trip. What can I do in the kitchen?  Clean up any spills right away.  Avoid walking on wet floors.  Keep items that you use a lot in easy-to-reach places.  If you need to reach something above you, use a strong step stool that has a grab bar.  Keep electrical cords out of the way.  Do not use floor polish or wax that makes floors slippery. If you must use wax, use non-skid floor wax.  Do not have  throw rugs and other things on the floor that can make you trip. What can I do with my stairs?  Do not leave any items on the stairs.  Make sure that there are handrails on both sides of the stairs and use them. Fix handrails that are broken or loose. Make sure that handrails are as long as the stairways.  Check any carpeting to make sure that it is firmly attached to the stairs. Fix any carpet that is loose or worn.  Avoid having throw rugs at the top or bottom of the stairs. If you do have throw rugs, attach them to the floor with carpet tape.  Make sure that you have a light switch at the top of the stairs and the bottom of the stairs. If you do not have them, ask someone to add them for you. What else can I do to help prevent falls?  Wear shoes that:  Do not have high heels.  Have rubber bottoms.  Are comfortable and fit you well.  Are closed at the  toe. Do not wear sandals.  If you use a stepladder:  Make sure that it is fully opened. Do not climb a closed stepladder.  Make sure that both sides of the stepladder are locked into place.  Ask someone to hold it for you, if possible.  Clearly mark and make sure that you can see:  Any grab bars or handrails.  First and last steps.  Where the edge of each step is.  Use tools that help you move around (mobility aids) if they are needed. These include:  Canes.  Walkers.  Scooters.  Crutches.  Turn on the lights when you go into a dark area. Replace any light bulbs as soon as they burn out.  Set up your furniture so you have a clear path. Avoid moving your furniture around.  If any of your floors are uneven, fix them.  If there are any pets around you, be aware of where they are.  Review your medicines with your doctor. Some medicines can make you feel dizzy. This can increase your chance of falling. Ask your doctor what other things that you can do to help prevent falls. This information is not intended to replace advice given to you by your health care provider. Make sure you discuss any questions you have with your health care provider. Document Released: 06/26/2009 Document Revised: 02/05/2016 Document Reviewed: 10/04/2014 Elsevier Interactive Patient Education  2017 [employer].

## 2020-01-02 ENCOUNTER — Ambulatory Visit
Admission: RE | Admit: 2020-01-02 | Discharge: 2020-01-02 | Disposition: A | Discharge status: Home / Self Care | Payer: PPO | Source: Ambulatory Visit | Attending: Primary Care | Admitting: Primary Care

## 2020-01-02 ENCOUNTER — Other Ambulatory Visit: Payer: Self-pay

## 2020-01-02 DIAGNOSIS — Z1231 Encounter for screening mammogram for malignant neoplasm of breast: Secondary | ICD-10-CM | POA: Diagnosis not present

## 2020-01-02 DIAGNOSIS — E2839 Other primary ovarian failure: Secondary | ICD-10-CM

## 2020-01-02 DIAGNOSIS — M85851 Other specified disorders of bone density and structure, right thigh: Secondary | ICD-10-CM | POA: Diagnosis not present

## 2020-01-02 DIAGNOSIS — Z780 Asymptomatic menopausal state: Secondary | ICD-10-CM | POA: Diagnosis not present

## 2020-01-04 ENCOUNTER — Other Ambulatory Visit: Payer: Self-pay | Admitting: Primary Care

## 2020-01-04 DIAGNOSIS — R928 Other abnormal and inconclusive findings on diagnostic imaging of breast: Secondary | ICD-10-CM

## 2020-01-10 ENCOUNTER — Other Ambulatory Visit: Payer: Self-pay | Admitting: Primary Care

## 2020-01-10 ENCOUNTER — Ambulatory Visit
Admission: RE | Admit: 2020-01-10 | Discharge: 2020-01-10 | Disposition: A | Discharge status: Home / Self Care | Payer: PPO | Source: Ambulatory Visit | Attending: Primary Care | Admitting: Primary Care

## 2020-01-10 ENCOUNTER — Other Ambulatory Visit: Payer: Self-pay

## 2020-01-10 DIAGNOSIS — R921 Mammographic calcification found on diagnostic imaging of breast: Secondary | ICD-10-CM | POA: Diagnosis not present

## 2020-01-10 DIAGNOSIS — R928 Other abnormal and inconclusive findings on diagnostic imaging of breast: Secondary | ICD-10-CM

## 2020-01-18 ENCOUNTER — Ambulatory Visit: Payer: PPO | Admitting: Primary Care

## 2020-01-22 ENCOUNTER — Ambulatory Visit (INDEPENDENT_AMBULATORY_CARE_PROVIDER_SITE_OTHER): Payer: PPO | Admitting: Primary Care

## 2020-01-22 ENCOUNTER — Encounter: Payer: Self-pay | Admitting: Primary Care

## 2020-01-22 ENCOUNTER — Other Ambulatory Visit: Payer: Self-pay

## 2020-01-22 VITALS — BP 136/86 | HR 76 | Temp 96.2°F | Ht 67.25 in | Wt 173.5 lb

## 2020-01-22 DIAGNOSIS — M792 Neuralgia and neuritis, unspecified: Secondary | ICD-10-CM | POA: Diagnosis not present

## 2020-01-22 DIAGNOSIS — E119 Type 2 diabetes mellitus without complications: Secondary | ICD-10-CM

## 2020-01-22 DIAGNOSIS — D638 Anemia in other chronic diseases classified elsewhere: Secondary | ICD-10-CM

## 2020-01-22 DIAGNOSIS — I10 Essential (primary) hypertension: Secondary | ICD-10-CM | POA: Diagnosis not present

## 2020-01-22 DIAGNOSIS — E785 Hyperlipidemia, unspecified: Secondary | ICD-10-CM

## 2020-01-22 NOTE — Assessment & Plan Note (Signed)
Compliant to atorvastatin 20 mg, repeat lipids pending.

## 2020-01-22 NOTE — Assessment & Plan Note (Signed)
Doing well on gabapentin 300 mg TID. Continue same.

## 2020-01-22 NOTE — Assessment & Plan Note (Signed)
Repeat CBC pending. 

## 2020-01-22 NOTE — Assessment & Plan Note (Signed)
Repeat A1C pending today. Compliant to metformin BID.  Foot exam today. Pneumonia vaccination UTD. Managed on statin and ACE. Eye exam UTD.  Await A1C. Follow up in 6 months.

## 2020-01-22 NOTE — Patient Instructions (Signed)
Stop by the lab prior to leaving today. I will notify you of your results once received.   Please schedule a physical with me for November 2021.  It was a pleasure to see you today!

## 2020-01-22 NOTE — Progress Notes (Signed)
Subjective:    Patient ID: Megan [name], female    DOB: Jan 23, 1951, 69 y.o.   MRN: [id number]  HPI  This visit occurred during the SARS-CoV-2 public health emergency.  Safety protocols were in place, including screening questions prior to the visit, additional usage of staff PPE, and extensive cleaning of exam room while observing appropriate contact time as indicated for disinfecting solutions.   Megan [name] is a 69 year old female with a history of type 2 diabetes, hyperlipidemia, neurogenic bowel, hypertension, L3 vertebral fracture, congenital spondylolisthesis of lumbar region, anemia who presents today for follow up of diabetes and labs.  Current medications for diabetes include: Metformin 500 mg BID.  She is checking her blood glucose 1 times daily and is getting readings of:  AM fasting: low 100's mostly   Last A1C: 6.9 in November 2020, due today. Last Eye Exam: Up to date Last Foot Exam: Due Pneumonia Vaccination: Completed in 2016 ACE/ARB: Lisinopril Statin: atorvastatin    BP Readings from Last 3 Encounters:  01/22/20 136/86  07/20/19 126/80  01/25/19 128/67     Review of Systems  Eyes: Negative for visual disturbance.  Respiratory: Negative for shortness of breath.   Cardiovascular: Negative for chest pain.  Neurological: Positive for numbness.       Chronic numbness to lower extremities.       Past Medical History:  Diagnosis Date  . Arthritis    "hands, back" (07/26/2017)  . Constipation due to pain medication   . Dyspnea    W/ PHYS CONDITION   . GERD (gastroesophageal reflux disease)   . History of bronchitis    "not since I quit smoking" (07/26/2017)  . History of kidney stones   . History of shingles   . Hyperlipidemia    takes Fish Oil daily  . Hypertension   . Neuromuscular disorder (HCC)    tingling toes  . 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 Pneumonia 2009  . PONV (postoperative nausea and vomiting)   . Restless leg    . Type 2 diabetes mellitus ([facility])   . Uterine cancer ([facility]) 1979   S/P hysterectomy  . Weakness    numbness and tingling in both feet r/t back     Social History   Socioeconomic History  . Marital status: Married    Spouse name: [name]  . Number of children: 1  . Years of education: 5  . Highest education level: Not on file  Occupational History  . Not on file  Tobacco Use  . Smoking status: Former Smoker    Packs/day: 1.50    Years: 37.00    Pack years: 55.50    Types: Cigarettes    Quit date: 05/13/2006    Years since quitting: 13.7  . Smokeless tobacco: Never Used  Substance and Sexual Activity  . Alcohol use: Yes    Comment: 07/26/2017 "glass of wine once/wk if not on RX"  . Drug use: No  . Sexual activity: Not Currently    Birth control/protection: Surgical  Other Topics Concern  . Not on file  Social History Narrative   Lives w/ husband   Married.   1 child, 1 grandchildren.   Retired. Once worked for [employer].   Enjoys reading.    Social Determinants of Health   Financial Resource Strain: Low Risk   . Difficulty of Paying Living Expenses: Not hard at all  Food Insecurity: No Food Insecurity  . Worried About Estate manager/land agent  of Food in the Last Year: Never true  . Ran Out of Food in the Last Year: Never true  Transportation Needs: No Transportation Needs  . Lack of Transportation (Medical): No  . Lack of Transportation (Non-Medical): No  Physical Activity: Inactive  . Days of Exercise per Week: 0 days  . Minutes of Exercise per Session: 0 min  Stress: No Stress Concern Present  . Feeling of Stress : Not at all  Social Connections:   . Frequency of Communication with Friends and Family:   . Frequency of Social Gatherings with Friends and Family:   . Attends Religious Services:   . Active Member of Clubs or Organizations:   . Attends Archivist Meetings:   Marland Kitchen Marital Status:   Intimate Partner Violence: Not At Risk  . Fear of Current or  Ex-Partner: No  . Emotionally Abused: No  . Physically Abused: No  . Sexually Abused: No    Past Surgical History:  Procedure Laterality Date  . ABDOMINAL EXPOSURE N/A 07/26/2017   Procedure: ABDOMINAL EXPOSURE;  Surgeon: Angelia Mould, MD;  Location: [facility];  Service: Vascular;  Laterality: N/A;  . ANTERIOR CERVICAL DECOMP/DISCECTOMY FUSION  2001  . ANTERIOR LUMBAR FUSION N/A 07/26/2017   Procedure: Lumbar five-Sacral one Anterior lumbar interbody fusion with Dr. Deitra Mayo for approach;  Surgeon: Ditty, Kevan Ny, MD;  Location: [facility];  Service: Neurosurgery;  Laterality: N/A;  . APPLICATION OF ROBOTIC ASSISTANCE FOR SPINAL PROCEDURE  12/19/2016   Procedure: APPLICATION OF ROBOTIC ASSISTANCE FOR SPINAL PROCEDURE;  Surgeon: Kevan Ny Ditty, MD;  Location: [facility];  Service: Neurosurgery;;  . APPLICATION OF ROBOTIC ASSISTANCE FOR SPINAL PROCEDURE N/A 07/26/2017   Procedure: APPLICATION OF ROBOTIC ASSISTANCE FOR SPINAL PROCEDURE;  Surgeon: Ditty, Kevan Ny, MD;  Location: [facility];  Service: Neurosurgery;  Laterality: N/A;  . APPLICATION OF ROBOTIC ASSISTANCE FOR SPINAL PROCEDURE N/A 09/02/2017   Procedure: APPLICATION OF ROBOTIC ASSISTANCE FOR SPINAL PROCEDURE;  Surgeon: Ditty, Kevan Ny, MD;  Location: [facility];  Service: Neurosurgery;  Laterality: N/A;  . BACK SURGERY    . BLADDER SUSPENSION  1991   tack  . INGUINAL HERNIA REPAIR Right 1991  . LUMBAR FUSION  2016; 2017; 07/26/2017   L4-5; L2-3; L5-S1  . POSTERIOR LUMBAR FUSION 4 WITH HARDWARE REMOVAL N/A 09/02/2017   Procedure: Lumbar three-four redo laminectomy; Repositioning of Left Sacral two screw; Extension of lumbar fusion to Thoracic twelve;  Surgeon: Ditty, Kevan Ny, MD;  Location: [facility];  Service: Neurosurgery;  Laterality: N/A;  . TONSILLECTOMY AND ADENOIDECTOMY  1959  . TUMOR EXCISION     WERTHIN'S TUMORS BOTH SIDES OF NECK  . VAGINAL HYSTERECTOMY  1979    Family History  Problem  Relation Age of Onset  . Diabetes Mother   . Dementia Mother   . Cirrhosis Mother        Non alcoholic  . COPD Father   . Diabetes Brother     Allergies  Allergen Reactions  . Shellfish Allergy Anaphylaxis, Swelling and Other (See Comments)    Tongue swells  . Oxycodone Nausea Only  . Penicillins Rash and Other (See Comments)    Has patient had a PCN reaction causing immediate rash, facial/tongue/throat swelling, SOB or lightheadedness with hypotension: Yes Has patient had a PCN reaction causing severe rash involving mucus membranes or skin necrosis: No Has patient had a PCN reaction that required hospitalization No Has patient had a PCN reaction occurring within the last 10 years:  No If all of the above answers are "NO", then may proceed with Cephalosporin use.    Current Outpatient Medications on File Prior to Visit  Medication Sig Dispense Refill  . atorvastatin (LIPITOR) 20 MG tablet TAKE 1 TABLET(20 MG) BY MOUTH EVERY EVENING FOR CHOLESTEROL 90 tablet 1  . baclofen (LIORESAL) 10 MG tablet TAKE 1 TABLET(10 MG) BY MOUTH THREE TIMES DAILY AS NEEDED FOR MUSCLE SPASMS 180 tablet 0  . Biotin 5000 MCG CAPS Take 5,000 mcg by mouth every morning.     . celecoxib (CELEBREX) 200 MG capsule TAKE 1 CAPSULE(200 MG) BY MOUTH TWICE DAILY AS NEEDED FOR MODERATE PAIN 180 capsule 0  . cholecalciferol (VITAMIN D) 1000 units tablet Take 2 tablets (2,000 Units total) by mouth daily after lunch. (Patient taking differently: Take 1,000 Units by mouth daily after lunch. ) 30 tablet 0  . Coenzyme Q10 (COQ10) 100 MG CAPS Take 100 mg daily by mouth.     . esomeprazole (NEXIUM 24HR) 20 MG capsule Take 1 capsule (20 mg total) by mouth daily at 12 noon. 90 capsule 1  . gabapentin (NEURONTIN) 300 MG capsule TAKE ONE CAPSULE BY MOUTH THREE TIMES DAILY 270 capsule 1  . Garlic 891 MG CAPS Take 500 mg 2 (two) times daily by mouth.    Marland Kitchen lisinopril (ZESTRIL) 10 MG tablet TAKE 1 TABLET(10 MG) BY MOUTH DAILY FOR BLOOD  PRESSURE 90 tablet 1  . metFORMIN (GLUCOPHAGE) 500 MG tablet TAKE 1 TABLET BY MOUTH TWICE DAILY 180 tablet 0  . oxybutynin (OXYTROL) 3.9 MG/24HR Place 1 patch onto the skin 5 days.     Marland Kitchen senna-docusate (SENOKOT-S) 8.6-50 MG tablet Take 2 tablets by mouth 2 (two) times daily. (Patient taking differently: Take 1-2 tablets by mouth daily as needed for mild constipation. )     No current facility-administered medications on file prior to visit.    BP 136/86   Pulse 76   Temp (!) 96.2 F (35.7 C) (Temporal)   Ht 5' 7.25" (1.708 m)   Wt 173 lb 8 oz (78.7 kg)   SpO2 98%   BMI 26.97 kg/m  ' Objective:   Physical Exam  Constitutional: She appears well-nourished.  Cardiovascular: Normal rate and regular rhythm.  Respiratory: Effort normal and breath sounds normal.  Musculoskeletal:     Cervical back: Neck supple.  Skin: Skin is warm and dry.  Psychiatric: She has a normal mood and affect.           Assessment & Plan:

## 2020-01-22 NOTE — Assessment & Plan Note (Signed)
Borderline today, continue to monitor. Repeat CMP pending.

## 2020-01-23 LAB — LIPID PANEL
Cholesterol: 139 mg/dL (ref 0–200)
HDL: 37.7 mg/dL — ABNORMAL LOW (ref >39.00)
NonHDL: 101.03
Total CHOL/HDL Ratio: 4
Triglycerides: 201 mg/dL — ABNORMAL HIGH (ref 0.0–149.0)
VLDL: 40.2 mg/dL — ABNORMAL HIGH (ref 0.0–40.0)

## 2020-01-23 LAB — CBC
HCT: 37.7 % (ref 36.0–46.0)
Hemoglobin: 12.7 g/dL (ref 12.0–15.0)
MCHC: 33.7 g/dL (ref 30.0–36.0)
MCV: 86.7 fl (ref 78.0–100.0)
Platelets: 204 10*3/uL (ref 150.0–400.0)
RBC: 4.34 Mil/uL (ref 3.87–5.11)
RDW: 14.4 % (ref 11.5–15.5)
WBC: 5.2 10*3/uL (ref 4.0–10.5)

## 2020-01-23 LAB — COMPREHENSIVE METABOLIC PANEL
ALT: 21 U/L (ref 0–35)
AST: 19 U/L (ref 0–37)
Albumin: 4.1 g/dL (ref 3.5–5.2)
Alkaline Phosphatase: 81 U/L (ref 39–117)
BUN: 14 mg/dL (ref 6–23)
CO2: 26 mEq/L (ref 19–32)
Calcium: 9.4 mg/dL (ref 8.4–10.5)
Chloride: 102 mEq/L (ref 96–112)
Creatinine, Ser: 0.85 mg/dL (ref 0.40–1.20)
GFR: 66.76 mL/min (ref >60.00)
Glucose, Bld: 155 mg/dL — ABNORMAL HIGH (ref 70–99)
Potassium: 4.8 mEq/L (ref 3.5–5.1)
Sodium: 137 mEq/L (ref 135–145)
Total Bilirubin: 0.4 mg/dL (ref 0.2–1.2)
Total Protein: 7.1 g/dL (ref 6.0–8.3)

## 2020-01-23 LAB — LDL CHOLESTEROL, DIRECT: Direct LDL: 64 mg/dL

## 2020-01-23 LAB — HEMOGLOBIN A1C: Hgb A1c MFr Bld: 7 % — ABNORMAL HIGH (ref 4.6–6.5)

## 2020-01-24 ENCOUNTER — Encounter: Payer: Self-pay | Admitting: *Deleted

## 2020-02-03 ENCOUNTER — Other Ambulatory Visit: Payer: Self-pay | Admitting: Primary Care

## 2020-02-03 DIAGNOSIS — G894 Chronic pain syndrome: Secondary | ICD-10-CM

## 2020-02-03 DIAGNOSIS — M4727 Other spondylosis with radiculopathy, lumbosacral region: Secondary | ICD-10-CM

## 2020-02-12 ENCOUNTER — Other Ambulatory Visit: Payer: Self-pay | Admitting: Primary Care

## 2020-02-12 DIAGNOSIS — E119 Type 2 diabetes mellitus without complications: Secondary | ICD-10-CM

## 2020-02-12 DIAGNOSIS — M4727 Other spondylosis with radiculopathy, lumbosacral region: Secondary | ICD-10-CM

## 2020-02-12 DIAGNOSIS — M4714 Other spondylosis with myelopathy, thoracic region: Secondary | ICD-10-CM

## 2020-04-09 ENCOUNTER — Other Ambulatory Visit: Payer: Self-pay | Admitting: Primary Care

## 2020-04-09 DIAGNOSIS — M62838 Other muscle spasm: Secondary | ICD-10-CM

## 2020-05-08 ENCOUNTER — Other Ambulatory Visit: Payer: Self-pay | Admitting: Primary Care

## 2020-05-08 DIAGNOSIS — E785 Hyperlipidemia, unspecified: Secondary | ICD-10-CM

## 2020-05-08 DIAGNOSIS — I10 Essential (primary) hypertension: Secondary | ICD-10-CM

## 2020-06-05 ENCOUNTER — Other Ambulatory Visit: Payer: Self-pay | Admitting: Primary Care

## 2020-06-05 DIAGNOSIS — E119 Type 2 diabetes mellitus without complications: Secondary | ICD-10-CM

## 2020-06-13 ENCOUNTER — Other Ambulatory Visit: Payer: Self-pay | Admitting: Primary Care

## 2020-06-13 ENCOUNTER — Other Ambulatory Visit: Payer: Self-pay

## 2020-06-13 ENCOUNTER — Ambulatory Visit
Admission: RE | Admit: 2020-06-13 | Discharge: 2020-06-13 | Disposition: A | Discharge status: Home / Self Care | Payer: PPO | Source: Ambulatory Visit | Attending: Primary Care | Admitting: Primary Care

## 2020-06-13 DIAGNOSIS — R921 Mammographic calcification found on diagnostic imaging of breast: Secondary | ICD-10-CM

## 2020-06-15 ENCOUNTER — Encounter: Payer: Self-pay | Admitting: Primary Care

## 2020-07-03 ENCOUNTER — Other Ambulatory Visit: Payer: Self-pay | Admitting: Primary Care

## 2020-07-03 DIAGNOSIS — M62838 Other muscle spasm: Secondary | ICD-10-CM

## 2020-07-04 NOTE — Telephone Encounter (Signed)
Last filled 04-09-20 #180 Last OV 01-22-20 Next OV 07-22-20 [facility]

## 2020-07-04 NOTE — Telephone Encounter (Signed)
Refills sent to pharmacy. 

## 2020-07-10 ENCOUNTER — Other Ambulatory Visit: Payer: Self-pay | Admitting: Primary Care

## 2020-07-15 ENCOUNTER — Other Ambulatory Visit: Payer: PPO

## 2020-07-22 ENCOUNTER — Encounter: Payer: Self-pay | Admitting: Primary Care

## 2020-07-22 ENCOUNTER — Encounter: Payer: PPO | Admitting: Primary Care

## 2020-07-22 ENCOUNTER — Other Ambulatory Visit: Payer: Self-pay

## 2020-07-22 ENCOUNTER — Ambulatory Visit (INDEPENDENT_AMBULATORY_CARE_PROVIDER_SITE_OTHER): Payer: PPO | Admitting: Primary Care

## 2020-07-22 VITALS — BP 140/82 | HR 97 | Temp 97.6°F | Ht 67.5 in | Wt 173.0 lb

## 2020-07-22 DIAGNOSIS — E785 Hyperlipidemia, unspecified: Secondary | ICD-10-CM | POA: Diagnosis not present

## 2020-07-22 DIAGNOSIS — Z122 Encounter for screening for malignant neoplasm of respiratory organs: Secondary | ICD-10-CM | POA: Diagnosis not present

## 2020-07-22 DIAGNOSIS — R8290 Unspecified abnormal findings in urine: Secondary | ICD-10-CM | POA: Diagnosis not present

## 2020-07-22 DIAGNOSIS — Z23 Encounter for immunization: Secondary | ICD-10-CM

## 2020-07-22 DIAGNOSIS — M62838 Other muscle spasm: Secondary | ICD-10-CM

## 2020-07-22 DIAGNOSIS — E1142 Type 2 diabetes mellitus with diabetic polyneuropathy: Secondary | ICD-10-CM

## 2020-07-22 DIAGNOSIS — I10 Essential (primary) hypertension: Secondary | ICD-10-CM | POA: Diagnosis not present

## 2020-07-22 DIAGNOSIS — M4727 Other spondylosis with radiculopathy, lumbosacral region: Secondary | ICD-10-CM

## 2020-07-22 DIAGNOSIS — Z0000 Encounter for general adult medical examination without abnormal findings: Secondary | ICD-10-CM

## 2020-07-22 DIAGNOSIS — K592 Neurogenic bowel, not elsewhere classified: Secondary | ICD-10-CM | POA: Diagnosis not present

## 2020-07-22 DIAGNOSIS — E119 Type 2 diabetes mellitus without complications: Secondary | ICD-10-CM | POA: Diagnosis not present

## 2020-07-22 DIAGNOSIS — G8222 Paraplegia, incomplete: Secondary | ICD-10-CM | POA: Diagnosis not present

## 2020-07-22 DIAGNOSIS — R29898 Other symptoms and signs involving the musculoskeletal system: Secondary | ICD-10-CM

## 2020-07-22 DIAGNOSIS — M792 Neuralgia and neuritis, unspecified: Secondary | ICD-10-CM | POA: Diagnosis not present

## 2020-07-22 HISTORY — DX: Unspecified abnormal findings in urine: R82.90

## 2020-07-22 LAB — POC URINALSYSI DIPSTICK (AUTOMATED)
Bilirubin, UA: NEGATIVE
Blood, UA: POSITIVE
Glucose, UA: NEGATIVE
Ketones, UA: POSITIVE
Nitrite, UA: POSITIVE
Protein, UA: POSITIVE — AB
Spec Grav, UA: >=1.03 — AB (ref 1.010–1.025)
Urobilinogen, UA: 0.2 E.U./dL
pH, UA: 5.5 (ref 5.0–8.0)

## 2020-07-22 LAB — POCT GLYCOSYLATED HEMOGLOBIN (HGB A1C): Hemoglobin A1C: 6.5 % — AB (ref 4.0–5.6)

## 2020-07-22 MED ORDER — SULFAMETHOXAZOLE-TRIMETHOPRIM 800-160 MG PO TABS: 1.0000 | ORAL_TABLET | Freq: Two times a day (BID) | ORAL | 0 refills | Status: DC

## 2020-07-22 NOTE — Assessment & Plan Note (Signed)
Chronic and continued. Continue baclofen as prescribed

## 2020-07-22 NOTE — Assessment & Plan Note (Signed)
Chronic, overall improved over the years. Doing well on baclofen, gababentin, celebrex.   CMP reviewed from May 2021. Continue current regimen.

## 2020-07-22 NOTE — Patient Instructions (Addendum)
Start Bactrim DS (sulfamethoxazole/trimethoprim) tablets for urinary tract infection. Take 1 tablet by mouth twice daily for 5 days.  You will be contacted regarding your referral to physical therapy and for lung cancer screening.  Please let us know if you have not been contacted within two weeks.   Increase activity level as discussed. Continue to work on [employer].   Try calcium chews, you need 1200 mg daily.  Please schedule a follow up appointment in 6 months for diabetes check.   It was a pleasure to see you today!   Preventive Care 67 Years and Older, Female Preventive care refers to lifestyle choices and visits with your health care provider that can promote health and wellness. This includes:  A yearly physical exam. This is also called an annual well check.  Regular dental and eye exams.  Immunizations.  Screening for certain conditions.  Healthy lifestyle choices, such as diet and exercise. What can I expect for my preventive care visit? Physical exam Your health care provider will check:  Height and weight. These may be used to calculate body mass index (BMI), which is a measurement that tells if you are at a healthy weight.  Heart rate and blood pressure.  Your skin for abnormal spots. Counseling Your health care provider may ask you questions about:  Alcohol, tobacco, and drug use.  Emotional well-being.  Home and relationship well-being.  Sexual activity.  Eating habits.  History of falls.  Memory and ability to understand (cognition).  Work and work Statistician.  Pregnancy and menstrual history. What immunizations do I need?  Influenza (flu) vaccine  This is recommended every year. Tetanus, diphtheria, and pertussis (Tdap) vaccine  You may need a Td booster every 10 years. Varicella (chickenpox) vaccine  You may need this vaccine if you have not already been vaccinated. Zoster (shingles) vaccine  You may need this after age  26. Pneumococcal conjugate (PCV13) vaccine  One dose is recommended after age 29. Pneumococcal polysaccharide (PPSV23) vaccine  One dose is recommended after age 81. Measles, mumps, and rubella (MMR) vaccine  You may need at least one dose of MMR if you were born in 1957 or later. You may also need a second dose. Meningococcal conjugate (MenACWY) vaccine  You may need this if you have certain conditions. Hepatitis A vaccine  You may need this if you have certain conditions or if you travel or work in places where you may be exposed to hepatitis A. Hepatitis B vaccine  You may need this if you have certain conditions or if you travel or work in places where you may be exposed to hepatitis B. Haemophilus influenzae type b (Hib) vaccine  You may need this if you have certain conditions. You may receive vaccines as individual doses or as more than one vaccine together in one shot (combination vaccines). Talk with your health care provider about the risks and benefits of combination vaccines. What tests do I need? Blood tests  Lipid and cholesterol levels. These may be checked every 5 years, or more frequently depending on your overall health.  Hepatitis C test.  Hepatitis B test. Screening  Lung cancer screening. You may have this screening every year starting at age 63 if you have a 30-pack-year history of smoking and currently smoke or have quit within the past 15 years.  Colorectal cancer screening. All adults should have this screening starting at age 35 and continuing until age 56. Your health care provider may recommend screening at age  45 if you are at increased risk. You will have tests every 1-10 years, depending on your results and the type of screening test.  Diabetes screening. This is done by checking your blood sugar (glucose) after you have not eaten for a while (fasting). You may have this done every 1-3 years.  Mammogram. This may be done every 1-2 years. Talk with  your health care provider about how often you should have regular mammograms.  BRCA-related cancer screening. This may be done if you have a family history of breast, ovarian, tubal, or peritoneal cancers. Other tests  Sexually transmitted disease (STD) testing.  Bone density scan. This is done to screen for osteoporosis. You may have this done starting at age 47. Follow these instructions at home: Eating and drinking  Eat a diet that includes fresh fruits and vegetables, whole grains, lean protein, and low-fat dairy products. Limit your intake of foods with high amounts of sugar, saturated fats, and salt.  Take vitamin and mineral supplements as recommended by your health care provider.  Do not drink alcohol if your health care provider tells you not to drink.  If you drink alcohol: ? Limit how much you have to 0-1 drink a day. ? Be aware of how much alcohol is in your drink. In the [place] one drink equals one 12 oz bottle of beer (355 mL), one 5 oz glass of wine (148 mL), or one 1 oz glass of hard liquor (44 mL). Lifestyle  Take daily care of your teeth and gums.  Stay active. Exercise for at least 30 minutes on 5 or more days each week.  Do not use any products that contain nicotine or tobacco, such as cigarettes, e-cigarettes, and chewing tobacco. If you need help quitting, ask your health care provider.  If you are sexually active, practice safe sex. Use a condom or other form of protection in order to prevent STIs (sexually transmitted infections).  Talk with your health care provider about taking a low-dose aspirin or statin. What's next?  Go to your health care provider once a year for a well check visit.  Ask your health care provider how often you should have your eyes and teeth checked.  Stay up to date on all vaccines. This information is not intended to replace advice given to you by your health care provider. Make sure you discuss any questions you have with your  health care provider. Document Revised: 08/24/2018 Document Reviewed: 08/24/2018 Elsevier Patient Education  [place].    Influenza (Flu) Vaccine (Inactivated or Recombinant): What You Need to Know 1. Why get vaccinated? Influenza vaccine can prevent influenza (flu). Flu is a contagious disease that spreads around the [place] every year, usually between October and May. Anyone can get the flu, but it is more dangerous for some people. Infants and young children, people 53 years of age and older, pregnant women, and people with certain health conditions or a weakened immune system are at greatest risk of flu complications. Pneumonia, bronchitis, sinus infections and ear infections are examples of flu-related complications. If you have a medical condition, such as heart disease, cancer or diabetes, flu can make it worse. Flu can cause fever and chills, sore throat, muscle aches, fatigue, cough, headache, and runny or stuffy nose. Some people may have vomiting and diarrhea, though this is more common in children than adults. Each year thousands of people in the [place] die from flu, and many more are hospitalized. Flu vaccine prevents  millions of illnesses and flu-related visits to the doctor each year. 2. Influenza vaccine CDC recommends everyone 45 months of age and older get vaccinated every flu season. Children 6 months through 84 years of age may need 2 doses during a single flu season. Everyone else needs only 1 dose each flu season. It takes about 2 weeks for protection to develop after vaccination. There are many flu viruses, and they are always changing. Each year a new flu vaccine is made to protect against three or four viruses that are likely to cause disease in the upcoming flu season. Even when the vaccine doesn't exactly match these viruses, it may still provide some protection. Influenza vaccine does not cause flu. Influenza vaccine may be given at the same time as  other vaccines. 3. Talk with your health care provider Tell your vaccine provider if the person getting the vaccine:  Has had an allergic reaction after a previous dose of influenza vaccine, or has any severe, life-threatening allergies.  Has ever had Guillain-Barr Syndrome (also called GBS). In some cases, your health care provider may decide to postpone influenza vaccination to a future visit. People with minor illnesses, such as a cold, may be vaccinated. People who are moderately or severely ill should usually wait until they recover before getting influenza vaccine. Your health care provider can give you more information. 4. Risks of a vaccine reaction  Soreness, redness, and swelling where shot is given, fever, muscle aches, and headache can happen after influenza vaccine.  There may be a very small increased risk of Guillain-Barr Syndrome (GBS) after inactivated influenza vaccine (the flu shot). Young children who get the flu shot along with pneumococcal vaccine (PCV13), and/or DTaP vaccine at the same time might be slightly more likely to have a seizure caused by fever. Tell your health care provider if a child who is getting flu vaccine has ever had a seizure. People sometimes faint after medical procedures, including vaccination. Tell your provider if you feel dizzy or have vision changes or ringing in the ears. As with any medicine, there is a very remote chance of a vaccine causing a severe allergic reaction, other serious injury, or death. 5. What if there is a serious problem? An allergic reaction could occur after the vaccinated person leaves the clinic. If you see signs of a severe allergic reaction (hives, swelling of the face and throat, difficulty breathing, a fast heartbeat, dizziness, or weakness), call 9-1-1 and get the person to the nearest hospital. For other signs that concern you, call your health care provider. Adverse reactions should be reported to the Vaccine  Adverse Event Reporting System (VAERS). Your health care provider will usually file this report, or you can do it yourself. Visit the VAERS website at www.vaers.SamedayNews.es or call 407-102-4755.VAERS is only for reporting reactions, and VAERS staff do not give medical advice. 6. The National Vaccine Injury Compensation Program The [employer] Vaccine Injury Compensation Program (VICP) is a federal program that was created to compensate people who may have been injured by certain vaccines. Visit the VICP website at GoldCloset.com.ee or call 915-527-0288 to learn about the program and about filing a claim. There is a time limit to file a claim for compensation. 7. How can I learn more?  Ask your healthcare provider.  Call your local or state health department.  Contact the Centers for Disease Control and Prevention (CDC): ? Call (979) 134-4235 (1-800-CDC-INFO) or ? Visit CDC's https://gibson.com/ Vaccine Information Statement (Interim) Inactivated Influenza Vaccine (04/27/2018) This  information is not intended to replace advice given to you by your health care provider. Make sure you discuss any questions you have with your health care provider. Document Revised: 12/19/2018 Document Reviewed: 05/01/2018 Elsevier Patient Education  [place].

## 2020-07-22 NOTE — Assessment & Plan Note (Signed)
Overall improved, she is ambulatory today with her walker, overall stable.

## 2020-07-22 NOTE — Addendum Note (Signed)
Addended by: Francella Solian on: 07/22/2020 04:10 PM   Modules accepted: Orders

## 2020-07-22 NOTE — Progress Notes (Signed)
Subjective:    Patient ID: Megan [name], female    DOB: 05/19/1951, 69 y.o.   MRN: [id number]  HPI  This visit occurred during the SARS-CoV-2 public health emergency.  Safety protocols were in place, including screening questions prior to the visit, additional usage of staff PPE, and extensive cleaning of exam room while observing appropriate contact time as indicated for disinfecting solutions.   Ms. Corwin is a 69 year old female who presents today for complete physical.  She would also like to discuss right knee weakness. This has been problematic in the past where she experiences buckling and falls. Overall she's had no problems for the two years, but about one month ago she started to notice a return in symptoms.   She has also noticed a foul smell to her urine along with urinary frequency for the last week. She's unsure if her symptoms are secondary to acute cystitis as she does have residual numbness from her neurogenic bladder.   Immunizations: -Tetanus: Completed in 2016 -Influenza: Due today -Shingles: Completed Shingrix -Pneumonia: Completed in 2016 -Covid-19: Completed series  Diet: She endorses a fair diet. Exercise: No regular exercise.  Eye exam: Completed in 2021 Dental exam: No recent exam, full dentures  Mammogram: Completed in April 2021 Dexa: Completed in 2021, osteopenia, no calcium but is taking vitamin D. Colonoscopy: Never completed Hep C Screen: Negative Lung Cancer Screening: Never completed  BP Readings from Last 3 Encounters:  07/22/20 140/82  01/22/20 136/86  07/20/19 126/80   She does not check her BP regularly at home, last reading she can remember was 134/76.   Review of Systems  Constitutional: Negative for unexpected weight change.  HENT: Negative for rhinorrhea.   Eyes: Negative for visual disturbance.  Respiratory: Negative for cough and shortness of breath.   Cardiovascular: Negative for chest pain.  Gastrointestinal: Negative  for constipation and diarrhea.  Genitourinary: Positive for frequency.       Foul smell to urine  Musculoskeletal: Positive for arthralgias and myalgias.  Skin: Negative for rash.  Allergic/Immunologic: Positive for environmental allergies.  Neurological: Positive for weakness. Negative for dizziness, numbness and headaches.  Psychiatric/Behavioral: The patient is not nervous/anxious.        Past Medical History:  Diagnosis Date  . Arthritis    "hands, back" (07/26/2017)  . Constipation due to pain medication   . Dyspnea    W/ PHYS CONDITION   . GERD (gastroesophageal reflux disease)   . History of bronchitis    "not since I quit smoking" (07/26/2017)  . History of kidney stones   . History of shingles   . Hyperlipidemia    takes Fish Oil daily  . Hypertension   . Neuromuscular disorder (HCC)    tingling toes  . 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 Pneumonia 2009  . PONV (postoperative nausea and vomiting)   . Restless leg   . Type 2 diabetes mellitus ([place])   . Uterine cancer ([facility]) 1979   S/P hysterectomy  . Weakness    numbness and tingling in both feet r/t back     Social History   Socioeconomic History  . Marital status: Married    Spouse name: [name]  . Number of children: 1  . Years of education: 78  . Highest education level: Not on file  Occupational History  . Not on file  Tobacco Use  . Smoking status: Former Smoker    Packs/day: 1.50    Years: 37.00  Pack years: 55.50    Types: Cigarettes    Quit date: 05/13/2006    Years since quitting: 14.2  . Smokeless tobacco: Never Used  Vaping Use  . Vaping Use: Never used  Substance and Sexual Activity  . Alcohol use: Yes    Comment: 07/26/2017 "glass of wine once/wk if not on RX"  . Drug use: No  . Sexual activity: Not Currently    Birth control/protection: Surgical  Other Topics Concern  . Not on file  Social History Narrative   Lives w/ husband   Married.   1 child, 1  grandchildren.   Retired. Once worked for [employer].   Enjoys reading.    Social Determinants of Health   Financial Resource Strain: Low Risk   . Difficulty of Paying Living Expenses: Not hard at all  Food Insecurity: No Food Insecurity  . Worried About Charity fundraiser in the Last Year: Never true  . Ran Out of Food in the Last Year: Never true  Transportation Needs: No Transportation Needs  . Lack of Transportation (Medical): No  . Lack of Transportation (Non-Medical): No  Physical Activity: Inactive  . Days of Exercise per Week: 0 days  . Minutes of Exercise per Session: 0 min  Stress: No Stress Concern Present  . Feeling of Stress : Not at all  Social Connections:   . Frequency of Communication with Friends and Family: Not on file  . Frequency of Social Gatherings with Friends and Family: Not on file  . Attends Religious Services: Not on file  . Active Member of Clubs or Organizations: Not on file  . Attends Archivist Meetings: Not on file  . Marital Status: Not on file  Intimate Partner Violence: Not At Risk  . Fear of Current or Ex-Partner: No  . Emotionally Abused: No  . Physically Abused: No  . Sexually Abused: No    Past Surgical History:  Procedure Laterality Date  . ABDOMINAL EXPOSURE N/A 07/26/2017   Procedure: ABDOMINAL EXPOSURE;  Surgeon: Angelia Mould, MD;  Location: [facility];  Service: Vascular;  Laterality: N/A;  . ANTERIOR CERVICAL DECOMP/DISCECTOMY FUSION  2001  . ANTERIOR LUMBAR FUSION N/A 07/26/2017   Procedure: Lumbar five-Sacral one Anterior lumbar interbody fusion with Dr. Deitra Mayo for approach;  Surgeon: Ditty, Kevan Ny, MD;  Location: [facility];  Service: Neurosurgery;  Laterality: N/A;  . APPLICATION OF ROBOTIC ASSISTANCE FOR SPINAL PROCEDURE  12/19/2016   Procedure: APPLICATION OF ROBOTIC ASSISTANCE FOR SPINAL PROCEDURE;  Surgeon: Kevan Ny Ditty, MD;  Location: [facility];  Service: Neurosurgery;;  . APPLICATION OF  ROBOTIC ASSISTANCE FOR SPINAL PROCEDURE N/A 07/26/2017   Procedure: APPLICATION OF ROBOTIC ASSISTANCE FOR SPINAL PROCEDURE;  Surgeon: Ditty, Kevan Ny, MD;  Location: [facility];  Service: Neurosurgery;  Laterality: N/A;  . APPLICATION OF ROBOTIC ASSISTANCE FOR SPINAL PROCEDURE N/A 09/02/2017   Procedure: APPLICATION OF ROBOTIC ASSISTANCE FOR SPINAL PROCEDURE;  Surgeon: Ditty, Kevan Ny, MD;  Location: [facility];  Service: Neurosurgery;  Laterality: N/A;  . BACK SURGERY    . BLADDER SUSPENSION  1991   tack  . INGUINAL HERNIA REPAIR Right 1991  . LUMBAR FUSION  2016; 2017; 07/26/2017   L4-5; L2-3; L5-S1  . POSTERIOR LUMBAR FUSION 4 WITH HARDWARE REMOVAL N/A 09/02/2017   Procedure: Lumbar three-four redo laminectomy; Repositioning of Left Sacral two screw; Extension of lumbar fusion to Thoracic twelve;  Surgeon: Ditty, Kevan Ny, MD;  Location: [facility];  Service: Neurosurgery;  Laterality:  N/A;  . [place]  . TUMOR EXCISION     WERTHIN'S TUMORS BOTH SIDES OF NECK  . VAGINAL HYSTERECTOMY  1979    Family History  Problem Relation Age of Onset  . Diabetes Mother   . Dementia Mother   . Cirrhosis Mother        Non alcoholic  . COPD Father   . Diabetes Brother     Allergies  Allergen Reactions  . Shellfish Allergy Anaphylaxis, Swelling and Other (See Comments)    Tongue swells  . Oxycodone Nausea Only  . Penicillins Rash and Other (See Comments)    Has patient had a PCN reaction causing immediate rash, facial/tongue/throat swelling, SOB or lightheadedness with hypotension: Yes Has patient had a PCN reaction causing severe rash involving mucus membranes or skin necrosis: No Has patient had a PCN reaction that required hospitalization No Has patient had a PCN reaction occurring within the last 10 years: No If all of the above answers are "NO", then may proceed with Cephalosporin use.    Current Outpatient Medications on File Prior to Visit  Medication  Sig Dispense Refill  . atorvastatin (LIPITOR) 20 MG tablet TAKE 1 TABLET(20 MG) BY MOUTH EVERY EVENING FOR CHOLESTEROL 90 tablet 1  . baclofen (LIORESAL) 10 MG tablet TAKE 1 TABLET(10 MG) BY MOUTH THREE TIMES DAILY AS NEEDED FOR MUSCLE SPASMS 180 tablet 0  . Biotin 5000 MCG CAPS Take 5,000 mcg by mouth every morning.     . celecoxib (CELEBREX) 200 MG capsule TAKE 1 CAPSULE(200 MG) BY MOUTH TWICE DAILY AS NEEDED FOR MODERATE PAIN 180 capsule 0  . cholecalciferol (VITAMIN D) 1000 units tablet Take 2 tablets (2,000 Units total) by mouth daily after lunch. (Patient taking differently: Take 1,000 Units by mouth daily after lunch. ) 30 tablet 0  . Coenzyme Q10 (COQ10) 100 MG CAPS Take 100 mg daily by mouth.     . esomeprazole (NEXIUM 24HR) 20 MG capsule Take 1 capsule (20 mg total) by mouth daily at 12 noon. 90 capsule 1  . gabapentin (NEURONTIN) 300 MG capsule TAKE ONE CAPSULE BY MOUTH THREE TIMES DAILY 270 capsule 1  . Garlic 176 MG CAPS Take 500 mg 2 (two) times daily by mouth.    Marland Kitchen lisinopril (ZESTRIL) 10 MG tablet TAKE 1 TABLET(10 MG) BY MOUTH DAILY FOR BLOOD PRESSURE 90 tablet 1  . metFORMIN (GLUCOPHAGE) 500 MG tablet TAKE 1 TABLET BY MOUTH TWICE DAILY 180 tablet 1  . oxybutynin (OXYTROL) 3.9 MG/24HR Place 1 patch onto the skin 5 days.     Marland Kitchen senna-docusate (SENOKOT-S) 8.6-50 MG tablet Take 2 tablets by mouth 2 (two) times daily. (Patient taking differently: Take 1-2 tablets by mouth daily as needed for mild constipation. )     No current facility-administered medications on file prior to visit.    BP 140/82   Pulse 97   Temp 97.6 F (36.4 C) (Temporal)   Ht 5' 7.5" (1.715 m)   Wt 173 lb (78.5 kg)   SpO2 100%   BMI 26.70 kg/m    Objective:   Physical Exam HENT:     Right Ear: External ear normal.     Left Ear: External ear normal.  Eyes:     Pupils: Pupils are equal, round, and reactive to light.  Cardiovascular:     Rate and Rhythm: Normal rate and regular rhythm.  Pulmonary:      Effort: Pulmonary effort is normal.     Breath  sounds: Normal breath sounds.  Abdominal:     General: Bowel sounds are normal.     Palpations: Abdomen is soft.     Tenderness: There is no abdominal tenderness.  Musculoskeletal:     Cervical back: Neck supple.     Comments: Ambulation stable with walker today. Strength equal to bilateral lower extremities.   Skin:    General: Skin is warm and dry.  Neurological:     Mental Status: She is alert and oriented to person, place, and time.     Cranial Nerves: No cranial nerve deficit.     Deep Tendon Reflexes:     Reflex Scores:      Patellar reflexes are 2+ on the right side and 2+ on the left side. Psychiatric:        Mood and Affect: Mood normal.            Assessment & Plan:

## 2020-07-22 NOTE — Assessment & Plan Note (Signed)
Given history, will place referral for outpatient PT. Need to prioritize falls prevention with strengthening and balance of right knee.

## 2020-07-22 NOTE — Assessment & Plan Note (Signed)
Lipid panel from May 2021 at goal. Continue atorvastatin 20 mg.

## 2020-07-22 NOTE — Assessment & Plan Note (Signed)
Pneumovax due, provided today. Influenza vaccination provided today. Mammogram and bone density scan UTD. She endorses that she completed Cologuard, do not see it in chart. Will search again. Lung cancer screening referral placed.  Encouraged to increase activity, work on diet. Exam today stable. Labs reviewed.

## 2020-07-22 NOTE — Assessment & Plan Note (Signed)
Chronic, improving but still with numbness. No longer self catheterizing as of 2019.

## 2020-07-22 NOTE — Assessment & Plan Note (Signed)
Doing well on gabapentin, celebrex, baclofen.  Continue same.

## 2020-07-22 NOTE — Assessment & Plan Note (Signed)
Above goal today, she endorses lower readings at home. Will have her closely monitor and report readings consistently at or above 135/90.  CMP is UTD.

## 2020-07-22 NOTE — Assessment & Plan Note (Signed)
A1C today of 6.5 which is an improvement from 7.0 in May 2021.   Continue Metformin 500 mg BID. Pneumonia vaccination due and provided today. Managed on statin and ACE-I.  Follow up in 6 months.

## 2020-07-22 NOTE — Assessment & Plan Note (Signed)
Acute for the last week. UA today with 1+ leuks, positive nitrites, trace blood. Culture sent and pending.  Rx for Bactrim DS course sent to pharmacy.

## 2020-07-22 NOTE — Assessment & Plan Note (Signed)
Doing well on gabapentin 300 mg for which she mostly takes TID, sometimes BID. Continue same.

## 2020-07-23 LAB — URINE CULTURE
MICRO NUMBER:: 11179747
SPECIMEN QUALITY:: ADEQUATE

## 2020-08-06 ENCOUNTER — Other Ambulatory Visit: Payer: Self-pay | Admitting: Primary Care

## 2020-08-06 DIAGNOSIS — M4714 Other spondylosis with myelopathy, thoracic region: Secondary | ICD-10-CM

## 2020-08-06 DIAGNOSIS — M4727 Other spondylosis with radiculopathy, lumbosacral region: Secondary | ICD-10-CM

## 2020-08-11 ENCOUNTER — Other Ambulatory Visit: Payer: Self-pay | Admitting: Primary Care

## 2020-08-11 DIAGNOSIS — M4727 Other spondylosis with radiculopathy, lumbosacral region: Secondary | ICD-10-CM

## 2020-08-11 DIAGNOSIS — G894 Chronic pain syndrome: Secondary | ICD-10-CM

## 2020-08-12 ENCOUNTER — Ambulatory Visit: Payer: PPO | Admitting: Physical Therapy

## 2020-08-18 ENCOUNTER — Other Ambulatory Visit: Payer: Self-pay | Admitting: *Deleted

## 2020-08-18 DIAGNOSIS — Z87891 Personal history of nicotine dependence: Secondary | ICD-10-CM

## 2020-08-31 ENCOUNTER — Other Ambulatory Visit: Payer: Self-pay | Admitting: Primary Care

## 2020-08-31 DIAGNOSIS — E119 Type 2 diabetes mellitus without complications: Secondary | ICD-10-CM

## 2020-09-03 ENCOUNTER — Other Ambulatory Visit: Payer: Self-pay | Admitting: Primary Care

## 2020-09-03 DIAGNOSIS — M62838 Other muscle spasm: Secondary | ICD-10-CM

## 2020-09-15 ENCOUNTER — Other Ambulatory Visit: Payer: Self-pay

## 2020-09-15 ENCOUNTER — Ambulatory Visit (INDEPENDENT_AMBULATORY_CARE_PROVIDER_SITE_OTHER): Payer: PPO | Admitting: Acute Care

## 2020-09-15 ENCOUNTER — Ambulatory Visit (INDEPENDENT_AMBULATORY_CARE_PROVIDER_SITE_OTHER)
Admission: RE | Admit: 2020-09-15 | Discharge: 2020-09-15 | Disposition: A | Discharge status: Home / Self Care | Payer: PPO | Source: Ambulatory Visit | Attending: Acute Care | Admitting: Acute Care

## 2020-09-15 ENCOUNTER — Encounter: Payer: Self-pay | Admitting: Acute Care

## 2020-09-15 VITALS — BP 120/80 | HR 78 | Temp 97.4°F | Ht 67.0 in | Wt 174.4 lb

## 2020-09-15 DIAGNOSIS — Z87891 Personal history of nicotine dependence: Secondary | ICD-10-CM | POA: Diagnosis not present

## 2020-09-15 DIAGNOSIS — Z122 Encounter for screening for malignant neoplasm of respiratory organs: Secondary | ICD-10-CM

## 2020-09-15 NOTE — Progress Notes (Signed)
Shared Decision Making Visit Lung Cancer Screening Program [phone])   Eligibility:  Age 69 y.o.  Pack Years Smoking History Calculation 66 pack year smoking history (# packs/per year x # years smoked)  Recent History of coughing up blood  no  Unexplained weight loss? no ( >Than 15 pounds within the last 6 months )  Prior History Lung / other cancer no (Diagnosis within the last 5 years already requiring surveillance chest CT Scans).  Smoking Status Former Smoker  Former Smokers: Years since quit: 14 years  Quit Date: 04/2006  Visit Components:  Discussion included one or more decision making aids. yes  Discussion included risk/benefits of screening. yes  Discussion included potential follow up diagnostic testing for abnormal scans. yes  Discussion included meaning and risk of over diagnosis. yes  Discussion included meaning and risk of False Positives. yes  Discussion included meaning of total radiation exposure. yes  Counseling Included:  Importance of adherence to annual lung cancer LDCT screening. yes  Impact of comorbidities on ability to participate in the program. yes  Ability and willingness to under diagnostic treatment. yes  Smoking Cessation Counseling:  Current Smokers:   Discussed importance of smoking cessation. yes  Information about tobacco cessation classes and interventions provided to patient. yes  Patient provided with "ticket" for LDCT Scan. yes  Symptomatic Patient. no  CounselingNA  Diagnosis Code: Tobacco Use Z72.0  Asymptomatic Patient yes  Counseling (Intermediate counseling: > three minutes counseling) P8099  Former Smokers:   Discussed the importance of maintaining cigarette abstinence. yes  Diagnosis Code: Personal History of Nicotine Dependence. I33.825  Information about tobacco cessation classes and interventions provided to patient. Yes  Patient provided with "ticket" for LDCT Scan. yes  Written Order for Lung  Cancer Screening with LDCT placed in Epic. Yes (CT Chest Lung Cancer Screening Low Dose W/O CM) [id number] Z12.2-Screening of respiratory organs Z87.891-Personal history of nicotine dependence  I spent 25 minutes of face to face time with Ms. Mcbryar discussing the risks and benefits of lung cancer screening. We viewed a power point together that explained in detail the above noted topics. We took the time to pause the power point at intervals to allow for questions to be asked and answered to ensure understanding. We discussed that she had taken the single most powerful action possible to decrease her risk of developing lung cancer when she quit smoking. I counseled her to remain smoke free, and to contact me if she ever had the desire to smoke again so that I can provide resources and tools to help support the effort to remain smoke free. We discussed the time and location of the scan, and that either  Doroteo Glassman RN or I will call with the results within  24-48 hours of receiving them. She has my card and contact information in the event she needs to speak with me, in addition to a copy of the power point we reviewed as a resource. She verbalized understanding of all of the above and had no further questions upon leaving the office.     I explained to the patient that there has been a high incidence of coronary artery disease noted on these exams. I explained that this is a non-gated exam therefore degree or severity cannot be determined. This patient is currently on statin therapy. I have asked the patient to follow-up with their PCP regarding any incidental finding of coronary artery disease and management with diet or medication as they feel is clinically  indicated. The patient verbalized understanding of the above and had no further questions.     Magdalen Spatz, NP 09/15/2020

## 2020-09-15 NOTE — Patient Instructions (Signed)
Thank you for participating in the [facility]. It was our pleasure to meet you today. We will call you with the results of your scan within the next few days. Your scan will be assigned a Lung RADS category score by the physicians reading the scans.  This Lung RADS score determines follow up scanning.  See below for description of categories, and follow up screening recommendations. We will be in touch to schedule your follow up screening annually or based on recommendations of our providers. We will fax a copy of your scan results to your Primary Care Physician, or the physician who referred you to the program, to ensure they have the results. Please call the office if you have any questions or concerns regarding your scanning experience or results.  Our office number is [phone]. Please speak with Denise Phelps, RN. She is our Lung Cancer Screening RN. If she is unavailable when you call, please have the office staff send her a message. She will return your call at her earliest convenience. Remember, if your scan is normal, we will scan you annually as long as you continue to meet the criteria for the program. (Age 55-77, Current smoker or smoker who has quit within the last 15 years). If you are a smoker, remember, quitting is the single most powerful action that you can take to decrease your risk of lung cancer and other pulmonary, breathing related problems. We know quitting is hard, and we are here to help.  Please let us know if there is anything we can do to help you meet your goal of quitting. If you are a former smoker, congratulations. We are proud of you! Remain smoke free! Remember you can refer friends or family members through the number above.  We will screen them to make sure they meet criteria for the program. Thank you for helping us take better care of you by participating in Lung Screening.  Lung RADS Categories:  Lung RADS 1: no nodules  or definitely non-concerning nodules.  Recommendation is for a repeat annual scan in 12 months.  Lung RADS 2:  nodules that are non-concerning in appearance and behavior with a very low likelihood of becoming an active cancer. Recommendation is for a repeat annual scan in 12 months.  Lung RADS 3: nodules that are probably non-concerning , includes nodules with a low likelihood of becoming an active cancer.  Recommendation is for a 6-month repeat screening scan. Often noted after an upper respiratory illness. We will be in touch to make sure you have no questions, and to schedule your 6-month scan.  Lung RADS 4 A: nodules with concerning findings, recommendation is most often for a follow up scan in 3 months or additional testing based on our provider's assessment of the scan. We will be in touch to make sure you have no questions and to schedule the recommended 3 month follow up scan.  Lung RADS 4 B:  indicates findings that are concerning. We will be in touch with you to schedule additional diagnostic testing based on our provider's  assessment of the scan.   

## 2020-09-18 ENCOUNTER — Other Ambulatory Visit: Payer: Self-pay | Admitting: *Deleted

## 2020-09-18 DIAGNOSIS — Z87891 Personal history of nicotine dependence: Secondary | ICD-10-CM

## 2020-09-18 NOTE — Progress Notes (Signed)
I have called the patient with the results of her scan. I explained that her scan was read as a Lung RADS 4 A : suspicious findings, either short term follow up in 3 months or alternatively  PET Scan evaluation may be considered when there is a solid component of  8 mm or larger. I explained that the radiologist feels this is scarring, but that we need to do a 3 month follow up to ensure this is stable. She has had several back surgeries, and had a rib that was cut during one of these surgeries , which she feels may possibly be what the radiologist is seeing.  She verbalized understanding and agreed to a 3 month follow up for stability of area of concern.   Megan [name] and Megan [name], please place order for 3 month follow up, fax results to PCP and let them know we are planning a follow up scan. Megan [name], please do your magic to schedule and pre cert. Thanks so much ladies!!

## 2020-10-01 ENCOUNTER — Telehealth (INDEPENDENT_AMBULATORY_CARE_PROVIDER_SITE_OTHER): Payer: PPO | Admitting: Family Medicine

## 2020-10-01 ENCOUNTER — Encounter: Payer: Self-pay | Admitting: Family Medicine

## 2020-10-01 ENCOUNTER — Other Ambulatory Visit (INDEPENDENT_AMBULATORY_CARE_PROVIDER_SITE_OTHER): Payer: PPO

## 2020-10-01 ENCOUNTER — Telehealth: Payer: Self-pay

## 2020-10-01 ENCOUNTER — Other Ambulatory Visit: Payer: Self-pay | Admitting: Family Medicine

## 2020-10-01 VITALS — BP 153/72 | Temp 97.5°F | Wt 174.0 lb

## 2020-10-01 DIAGNOSIS — Z20822 Contact with and (suspected) exposure to covid-19: Secondary | ICD-10-CM

## 2020-10-01 NOTE — Telephone Encounter (Signed)
Please see visit note

## 2020-10-01 NOTE — Telephone Encounter (Signed)
Pt has cold symptoms; chills, H/A pain level is 4 that comes and goes; has head congestion; dry cough; slight S/T and runny nose. Pt also has pain on rt side of face that is above and below rt eye;no fever or SOB.no other covid symptoms; above symptoms started on 06/29/21.  Pt does not have computer, my chart or phone with camera; pt does not want to go to UC and scheduled phone(virtual visit) 10/01/20 at 1pm with Dr Einar Pheasant. UC & ED precautions given to pt and pt voiced understanding.

## 2020-10-01 NOTE — Progress Notes (Signed)
Virtual Visit via Telephone Note  I connected with Megan [name] on 10/01/20 at  1:00 PM EST by telephone and verified that I am speaking with the correct person using two identifiers.   I discussed the limitations, risks, security and privacy concerns of performing an evaluation and management service by telephone and the availability of in person appointments. I also discussed with the patient that there may be a patient responsible charge related to this service. The patient expressed understanding and agreed to proceed.  Patient location: Home Provider Location: [place] Participants: Lesleigh Noe and [name]   History of Present Illness: Chief Complaint  Patient presents with  . Cough    Dry. All symptoms x 2 days   . Headache  . Sore Throat  . Nasal Congestion  . Chills  . Dental Pain    Even though she wears dentures her "teeth" hurt on the right side     Cough Episode onset: 09/29/2020. The problem has been unchanged. The cough is non-productive. Associated symptoms include chills, ear pain (right side), headaches (right side of face head and eye), nasal congestion, rhinorrhea and a sore throat. Pertinent negatives include no chest pain, fever, myalgias, shortness of breath or wheezing. Treatments tried: advil cold and sinus. The treatment provided no relief.  Headache  Associated symptoms include coughing, ear pain (right side), nausea, rhinorrhea and a sore throat. Pertinent negatives include no fever or vomiting.  Sore Throat  Associated symptoms include congestion, coughing, ear pain (right side) and headaches (right side of face head and eye). Pertinent negatives include no diarrhea, shortness of breath or vomiting.  Dental Pain  Pertinent negatives include no fever.   Sick contact: husband with similar symptoms  No loss of taste or smell  Has not gotten the covid booster  Did get the flu shot  Has not tried a neti pot or saline  rinse  Review of Systems  Constitutional: Positive for chills. Negative for fever.  HENT: Positive for congestion, ear pain (right side), rhinorrhea, sinus pain and sore throat.   Respiratory: Positive for cough. Negative for shortness of breath and wheezing.   Cardiovascular: Negative for chest pain.  Gastrointestinal: Positive for nausea. Negative for diarrhea and vomiting.  Musculoskeletal: Negative for myalgias.  Neurological: Positive for headaches (right side of face head and eye).      Observations/Objective: BP (!) 153/72   Temp (!) 97.5 F (36.4 C) (Temporal)   Wt 174 lb (78.9 kg)   BMI 27.25 kg/m   Phone visit:  Patient speaking in complete sentences No distress Alert and oriented Normal mood  Assessment and Plan: Problem List Items Addressed This Visit   None   Visit Diagnoses    Suspected COVID-19 virus infection    -  Primary     Discussed OTC treatment for viral illness Patient will come today for drive-up testing Instructed to isolate until the results come back  If negative and symptoms persisting will call back for consideration for course of antibiotics   Follow Up Instructions:  Return if symptoms worsen or fail to improve.   I discussed the assessment and treatment plan with the patient. The patient was provided an opportunity to ask questions and all were answered. The patient agreed with the plan and demonstrated an understanding of the instructions.   The patient was advised to call back or seek an in-person evaluation if the symptoms worsen or if the condition fails to improve as anticipated.  I provided 9 minutes of non-face-to-face time during this encounter.  [place], MD

## 2020-10-03 LAB — NOVEL CORONAVIRUS, NAA: SARS-CoV-2, NAA: DETECTED — AB

## 2020-10-03 LAB — SARS-COV-2, NAA 2 DAY TAT

## 2020-10-03 LAB — SPECIMEN STATUS REPORT

## 2020-10-05 ENCOUNTER — Other Ambulatory Visit: Payer: Self-pay | Admitting: Primary Care

## 2020-10-05 DIAGNOSIS — M62838 Other muscle spasm: Secondary | ICD-10-CM

## 2020-11-03 ENCOUNTER — Other Ambulatory Visit: Payer: Self-pay | Admitting: Primary Care

## 2020-11-03 DIAGNOSIS — E785 Hyperlipidemia, unspecified: Secondary | ICD-10-CM

## 2020-11-04 ENCOUNTER — Other Ambulatory Visit: Payer: Self-pay | Admitting: Primary Care

## 2020-11-04 DIAGNOSIS — I10 Essential (primary) hypertension: Secondary | ICD-10-CM

## 2020-12-15 ENCOUNTER — Other Ambulatory Visit: Payer: Self-pay

## 2020-12-15 ENCOUNTER — Ambulatory Visit (INDEPENDENT_AMBULATORY_CARE_PROVIDER_SITE_OTHER)
Admission: RE | Admit: 2020-12-15 | Discharge: 2020-12-15 | Disposition: A | Discharge status: Home / Self Care | Payer: PPO | Source: Ambulatory Visit | Attending: Acute Care | Admitting: Acute Care

## 2020-12-15 DIAGNOSIS — J438 Other emphysema: Secondary | ICD-10-CM | POA: Diagnosis not present

## 2020-12-15 DIAGNOSIS — Z87891 Personal history of nicotine dependence: Secondary | ICD-10-CM | POA: Diagnosis not present

## 2020-12-15 DIAGNOSIS — I2510 Atherosclerotic heart disease of native coronary artery without angina pectoris: Secondary | ICD-10-CM | POA: Diagnosis not present

## 2020-12-15 DIAGNOSIS — J432 Centrilobular emphysema: Secondary | ICD-10-CM | POA: Diagnosis not present

## 2020-12-15 DIAGNOSIS — J929 Pleural plaque without asbestos: Secondary | ICD-10-CM | POA: Diagnosis not present

## 2020-12-25 ENCOUNTER — Other Ambulatory Visit: Payer: Self-pay | Admitting: *Deleted

## 2020-12-25 DIAGNOSIS — Z87891 Personal history of nicotine dependence: Secondary | ICD-10-CM

## 2020-12-25 NOTE — Progress Notes (Signed)
Megan [name], I have called the patient with her results. She verbalized understanding. 6 month follow up.  Please call patient and let them  know their  low dose Ct was read as a Lung  RADS 3, nodules that are probably benign findings, short term follow up suggested: includes nodules with a low likelihood of becoming a clinically active cancer. Radiology recommends a 6 month repeat LDCT follow up. .Please let them  know we will order and schedule their 6 month follow up  screening scan for 05/2021. Please let them  know there was notation of CAD on their  scan.  Please remind the patient  that this is a non-gated exam therefore degree or severity of disease  cannot be determined. Please have them  follow up with their PCP regarding potential risk factor modification, dietary therapy or pharmacologic therapy if clinically indicated. Pt.  is  currently on statin therapy. Please place order for 6 month follow up  screening scan for  05/2021 and fax results to PCP. Thanks so much.  Megan [name], as an [place], we will re-check in 6 months regarding the areas of pulmonary scarring to ensure they are stable.   She does have CAD per the scan, but she is currently on statin therapy. Please let me know if you have any questions. Thanks so much

## 2020-12-29 ENCOUNTER — Ambulatory Visit: Payer: PPO

## 2020-12-31 ENCOUNTER — Other Ambulatory Visit: Payer: Self-pay

## 2020-12-31 ENCOUNTER — Ambulatory Visit (INDEPENDENT_AMBULATORY_CARE_PROVIDER_SITE_OTHER): Payer: PPO

## 2020-12-31 DIAGNOSIS — Z0000 Encounter for general adult medical examination without abnormal findings: Secondary | ICD-10-CM

## 2020-12-31 NOTE — Progress Notes (Signed)
PCP notes:  Health Maintenance: Eye exam- due Covid booster- due   Abnormal Screenings: none   Patient concerns: Needs handicap form completed Blood pressure slightly elevated at times   Nurse concerns: none   Next PCP appt.: 01/21/2021 @ 3 pm

## 2020-12-31 NOTE — Progress Notes (Signed)
Subjective:   Megan [name] is a 70 y.o. female who presents for Medicare Annual (Subsequent) preventive examination.  Review of Systems: N/A   I connected with the patient today by telephone and verified that I am speaking with the correct person using two identifiers. Location patient: home Location nurse: work Persons participating in the telephone visit: patient, nurse.   I discussed the limitations, risks, security and privacy concerns of performing an evaluation and management service by telephone and the availability of in person appointments. I also discussed with the patient that there may be a patient responsible charge related to this service. The patient expressed understanding and verbally consented to this telephonic visit.        Cardiac Risk Factors include: advanced age (>26men, >33 women);diabetes mellitus;hypertension     Objective:    Today's Vitals   There is no height or weight on file to calculate BMI.  Advanced Directives 12/31/2020 12/27/2019 05/23/2018 09/07/2017 08/29/2017 07/28/2017 07/26/2017  Does Patient Have a Medical Advance Directive? No No No No No No No  Would patient like information on creating a medical advance directive? No - Patient declined No - Patient declined No - Patient declined No - Patient declined No - Patient declined Yes (Inpatient - patient requests chaplain consult to create a medical advance directive) No - Patient declined    Current Medications (verified) Outpatient Encounter Medications as of 12/31/2020  Medication Sig  . atorvastatin (LIPITOR) 20 MG tablet TAKE 1 TABLET(20 MG) BY MOUTH EVERY EVENING FOR CHOLESTEROL  . baclofen (LIORESAL) 10 MG tablet TAKE 1 TABLET(10 MG) BY MOUTH THREE TIMES DAILY AS NEEDED FOR MUSCLE SPASMS  . Biotin 5000 MCG CAPS Take 5,000 mcg by mouth every morning.   . celecoxib (CELEBREX) 200 MG capsule TAKE 1 CAPSULE(200 MG) BY MOUTH TWICE DAILY AS NEEDED FOR MODERATE PAIN  . cholecalciferol  (VITAMIN D) 1000 units tablet Take 2 tablets (2,000 Units total) by mouth daily after lunch. (Patient taking differently: Take 1,000 Units by mouth daily after lunch.)  . Coenzyme Q10 (COQ10) 100 MG CAPS Take 100 mg daily by mouth.   . esomeprazole (NEXIUM 24HR) 20 MG capsule Take 1 capsule (20 mg total) by mouth daily at 12 noon.  . gabapentin (NEURONTIN) 300 MG capsule TAKE ONE CAPSULE BY MOUTH THREE TIMES DAILY  . Garlic 185 MG CAPS Take 500 mg 2 (two) times daily by mouth.  Marland Kitchen lisinopril (ZESTRIL) 10 MG tablet TAKE 1 TABLET(10 MG) BY MOUTH DAILY FOR BLOOD PRESSURE  . metFORMIN (GLUCOPHAGE) 500 MG tablet TAKE 1 TABLET BY MOUTH TWICE DAILY  . oxybutynin (OXYTROL) 3.9 MG/24HR Place 1 patch onto the skin 5 days.   Marland Kitchen senna-docusate (SENOKOT-S) 8.6-50 MG tablet Take 2 tablets by mouth 2 (two) times daily. (Patient taking differently: Take 1-2 tablets by mouth daily as needed for mild constipation.)   No facility-administered encounter medications on file as of 12/31/2020.    Allergies (verified) Shellfish allergy, Oxycodone, and Penicillins   History: Past Medical History:  Diagnosis Date  . Arthritis    "hands, back" (07/26/2017)  . Constipation due to pain medication   . Dyspnea    W/ PHYS CONDITION   . GERD (gastroesophageal reflux disease)   . History of bronchitis    "not since I quit smoking" (07/26/2017)  . History of kidney stones   . History of shingles   . Hyperlipidemia    takes Fish Oil daily  . Hypertension   . Neuromuscular disorder ([place])  tingling toes  . 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 Pneumonia 2009  . PONV (postoperative nausea and vomiting)   . Restless leg   . Type 2 diabetes mellitus ([place])   . Uterine cancer ([place]) 1979   S/P hysterectomy  . Weakness    numbness and tingling in both feet r/t back   Past Surgical History:  Procedure Laterality Date  . ABDOMINAL EXPOSURE N/A 07/26/2017   Procedure: ABDOMINAL EXPOSURE;  Surgeon: Angelia Mould, MD;  Location: [facility];  Service: Vascular;  Laterality: N/A;  . ANTERIOR CERVICAL DECOMP/DISCECTOMY FUSION  2001  . ANTERIOR LUMBAR FUSION N/A 07/26/2017   Procedure: Lumbar five-Sacral one Anterior lumbar interbody fusion with Dr. Deitra Mayo for approach;  Surgeon: Ditty, Kevan Ny, MD;  Location: [facility];  Service: Neurosurgery;  Laterality: N/A;  . APPLICATION OF ROBOTIC ASSISTANCE FOR SPINAL PROCEDURE  12/19/2016   Procedure: APPLICATION OF ROBOTIC ASSISTANCE FOR SPINAL PROCEDURE;  Surgeon: Kevan Ny Ditty, MD;  Location: [facility];  Service: Neurosurgery;;  . APPLICATION OF ROBOTIC ASSISTANCE FOR SPINAL PROCEDURE N/A 07/26/2017   Procedure: APPLICATION OF ROBOTIC ASSISTANCE FOR SPINAL PROCEDURE;  Surgeon: Ditty, Kevan Ny, MD;  Location: [facility];  Service: Neurosurgery;  Laterality: N/A;  . APPLICATION OF ROBOTIC ASSISTANCE FOR SPINAL PROCEDURE N/A 09/02/2017   Procedure: APPLICATION OF ROBOTIC ASSISTANCE FOR SPINAL PROCEDURE;  Surgeon: Ditty, Kevan Ny, MD;  Location: [facility];  Service: Neurosurgery;  Laterality: N/A;  . BACK SURGERY    . BLADDER SUSPENSION  1991   tack  . INGUINAL HERNIA REPAIR Right 1991  . LUMBAR FUSION  2016; 2017; 07/26/2017   L4-5; L2-3; L5-S1  . POSTERIOR LUMBAR FUSION 4 WITH HARDWARE REMOVAL N/A 09/02/2017   Procedure: Lumbar three-four redo laminectomy; Repositioning of Left Sacral two screw; Extension of lumbar fusion to Thoracic twelve;  Surgeon: Ditty, Kevan Ny, MD;  Location: [facility];  Service: Neurosurgery;  Laterality: N/A;  . TONSILLECTOMY AND ADENOIDECTOMY  1959  . TUMOR EXCISION     WERTHIN'S TUMORS BOTH SIDES OF NECK  . VAGINAL HYSTERECTOMY  1979   Family History  Problem Relation Age of Onset  . Diabetes Mother   . Dementia Mother   . Cirrhosis Mother        Non alcoholic  . COPD Father   . Diabetes Brother    Social History   Socioeconomic History  . Marital status: Married    Spouse name: [name]  .  Number of children: 1  . Years of education: 52  . Highest education level: Not on file  Occupational History  . Not on file  Tobacco Use  . Smoking status: Former Smoker    Packs/day: 1.50    Years: 53.00    Pack years: 79.50    Types: Cigarettes    Quit date: 05/13/2006    Years since quitting: 14.6  . Smokeless tobacco: Never Used  Vaping Use  . Vaping Use: Never used  Substance and Sexual Activity  . Alcohol use: Yes    Comment: 07/26/2017 "glass of wine once/wk if not on RX"  . Drug use: No  . Sexual activity: Not Currently    Birth control/protection: Surgical  Other Topics Concern  . Not on file  Social History Narrative   Lives w/ husband   Married.   1 child, 1 grandchildren.   Retired. Once worked for [employer].   Enjoys reading.    Social Determinants of Radio broadcast assistant  Strain: Low Risk   . Difficulty of Paying Living Expenses: Not hard at all  Food Insecurity: No Food Insecurity  . Worried About Charity fundraiser in the Last Year: Never true  . Ran Out of Food in the Last Year: Never true  Transportation Needs: No Transportation Needs  . Lack of Transportation (Medical): No  . Lack of Transportation (Non-Medical): No  Physical Activity: Inactive  . Days of Exercise per Week: 0 days  . Minutes of Exercise per Session: 0 min  Stress: No Stress Concern Present  . Feeling of Stress : Not at all  Social Connections: Not on file    Tobacco Counseling Counseling given: Not Answered   Clinical Intake:  Pre-visit preparation completed: Yes  Pain : No/denies pain     Nutritional Risks: None Diabetes: Yes CBG done?: No Did pt. bring in CBG monitor from home?: No  How often do you need to have someone help you when you read instructions, pamphlets, or other written materials from your doctor or pharmacy?: 1 - Never  Diabetic: Yes Nutrition Risk Assessment:  Has the patient had any N/V/D within the last 2 months?  No  Does the  patient have any non-healing wounds?  No  Has the patient had any unintentional weight loss or weight gain?  No   Diabetes:  Is the patient diabetic?  Yes  If diabetic, was a CBG obtained today?  No  telephone visit  Did the patient bring in their glucometer from home?  No  telephone visit  How often do you monitor your CBG's? Daily.   Financial Strains and Diabetes Management:  Are you having any financial strains with the device, your supplies or your medication? No .  Does the patient want to be seen by Chronic Care Management for management of their diabetes?  No  Would the patient like to be referred to a Nutritionist or for Diabetic Management?  No   Diabetic Exams:  Diabetic Eye Exam: Overdue for diabetic eye exam. Pt has been advised about the importance in completing this exam. Patient advised to call and schedule an eye exam. Diabetic Foot Exam: Completed 01/22/2020   Interpreter Needed?: No  Information entered by :: [place], LPN   Activities of Daily Living In your present state of health, do you have any difficulty performing the following activities: 12/31/2020  Hearing? N  Vision? N  Difficulty concentrating or making decisions? N  Walking or climbing stairs? Y  Dressing or bathing? Y  Doing errands, shopping? Y  Preparing Food and eating ? N  Using the Toilet? N  In the past six months, have you accidently leaked urine? Y  Comment some leakage at times, wears Poise pads  Do you have problems with loss of bowel control? N  Managing your Medications? N  Managing your Finances? N  Housekeeping or managing your Housekeeping? Y  Some recent data might be hidden    Patient Care Team: Pleas Koch, NP as PCP - General (Internal Medicine) Kristeen Miss, MD as Consulting Physician (Neurosurgery)  Indicate any recent Medical Services you may have received from other than [facility] providers in the past year (date may be approximate).     Assessment:   This  is a routine wellness examination for Shakelia.  Hearing/Vision screen  Hearing Screening   125Hz  250Hz  500Hz  1000Hz  2000Hz  3000Hz  4000Hz  6000Hz  8000Hz   Right ear:           Left ear:  Vision Screening Comments: Patient gets annual eye exams   Dietary issues and exercise activities discussed: Current Exercise Habits: The patient does not participate in regular exercise at present, Exercise limited by: None identified  Goals    . Patient Stated     12/27/2019, I will maintain and continue medications as prescribed.     . Patient Stated     12/31/2020, I will maintain and continue medications as prescribed.       Depression Screen PHQ 2/9 Scores 12/31/2020 12/27/2019 09/23/2017 09/23/2017 01/07/2017  PHQ - 2 Score 2 2 1  0 0  PHQ- 9 Score 2 2 5  - 1    Fall Risk Fall Risk  12/31/2020 12/27/2019 04/11/2019 11/21/2017 09/23/2017  Falls in the past year? 0 0 0 No No  Comment - - Emmi Telephone Survey: data to providers prior to load - -  Number falls in past yr: 0 0 - - -  Injury with Fall? 0 0 - - -  Comment - - - - -  Risk for fall due to : Impaired balance/gait;Medication side effect Medication side effect - - -  Follow up Falls evaluation completed;Falls prevention discussed Falls evaluation completed;Falls prevention discussed - - -    FALL RISK PREVENTION PERTAINING TO THE HOME:  Any stairs in or around the home? Yes  If so, are there any without handrails? No  Home free of loose throw rugs in walkways, pet beds, electrical cords, etc? Yes  Adequate lighting in your home to reduce risk of falls? Yes   ASSISTIVE DEVICES UTILIZED TO PREVENT FALLS:  Life alert? No  Use of a cane, walker or w/c? Yes  Grab bars in the bathroom? Yes  Shower chair or bench in shower? Yes  Elevated toilet seat or a handicapped toilet? No   TIMED UP AND GO:  Was the test performed? N/A telephone visit .   Cognitive Function: MMSE - Mini Mental State Exam 12/31/2020 12/27/2019  Orientation to  time 5 5  Orientation to Place 5 5  Registration 3 3  Attention/ Calculation 5 5  Recall 3 3  Language- repeat 1 1  Mini Cog  Mini-Cog screen was completed. Maximum score is 22. A value of 0 denotes this part of the MMSE was not completed or the patient failed this part of the Mini-Cog screening.       Immunizations Immunization History  Administered Date(s) Administered  . Fluad Quad(high Dose 65+) 07/20/2019, 07/22/2020  . Influenza, High Dose Seasonal PF 06/30/2018  . Influenza,inj,Quad PF,6+ Mos 07/01/2017  . Influenza-Unspecified 09/27/2016  . PFIZER(Purple Top)SARS-COV-2 Vaccination 11/30/2019, 12/28/2019  . Pneumococcal Polysaccharide-23 04/07/2015, 07/22/2020  . Tdap 04/07/2015  . Zoster Recombinat (Shingrix) 11/06/2019, 01/17/2020    TDAP status: Up to date  Flu Vaccine status: Up to date  Pneumococcal vaccine status: Up to date  Covid-19 vaccine status: 2 doses completed, booster due. Patient will get after completing her mammogram.  Qualifies for Shingles Vaccine? Yes   Zostavax completed No   Shingrix Completed?: Yes  Screening Tests Health Maintenance  Topic Date Due  . COVID-19 Vaccine (3 - Booster for Pfizer series) 06/28/2020  . OPHTHALMOLOGY EXAM  07/10/2020  . COLONOSCOPY (Pts 45-17yrs Insurance coverage will need to be confirmed)  12/27/2023 (Originally 04/26/1998)  . HEMOGLOBIN A1C  01/19/2021  . FOOT EXAM  01/21/2021  . INFLUENZA VACCINE  04/13/2021  . PNA vac Low Risk Adult (2 of 2 - PCV13) 07/22/2021  . MAMMOGRAM  01/09/2022  . TETANUS/TDAP  04/06/2025  . DEXA SCAN  Completed  . Hepatitis C Screening  Completed  . HPV VACCINES  Aged Out    Health Maintenance  Health Maintenance Due  Topic Date Due  . COVID-19 Vaccine (3 - Booster for Pfizer series) 06/28/2020  . OPHTHALMOLOGY EXAM  07/10/2020    Colorectal cancer screening: declined  Mammogram status: scheduled 01/02/2021  Bone Density status: Completed 01/02/2020. Results  reflect: Bone density results: OSTEOPENIA. Repeat every 2 years.  Lung Cancer Screening: (Low Dose CT Chest recommended if Age 24-80 years, 30 pack-year currently smoking OR have quit w/in 15years.) does not qualify.   Additional Screening:  Hepatitis C Screening: does qualify; Completed 01/31/2017  Vision Screening: Recommended annual ophthalmology exams for early detection of glaucoma and other disorders of the eye. Is the patient up to date with their annual eye exam?  Yes  Who is the provider or what is the name of the office in which the patient attends annual eye exams? [facility]  If pt is not established with a provider, would they like to be referred to a provider to establish care? No .   Dental Screening: Recommended annual dental exams for proper oral hygiene  Community Resource Referral / Chronic Care Management: CRR required this visit?  No   CCM required this visit?  No      Plan:     I have personally reviewed and noted the following in the patient's chart:   . Medical and social history . Use of alcohol, tobacco or illicit drugs  . Current medications and supplements . Functional ability and status . Nutritional status . Physical activity . Advanced directives . List of other physicians . Hospitalizations, surgeries, and ER visits in previous 12 months . Vitals . Screenings to include cognitive, depression, and falls . Referrals and appointments  In addition, I have reviewed and discussed with patient certain preventive protocols, quality metrics, and best practice recommendations. A written personalized care plan for preventive services as well as general preventive health recommendations were provided to patient.   Due to this being a telephonic visit, the after visit summary with patients personalized plan was offered to patient via office or my-chart. Patient preferred to pick up at office at next visit or via mychart.   Andrez Grime,  LPN   [id number]

## 2020-12-31 NOTE — Patient Instructions (Signed)
Megan [name] , Thank you for taking time to come for your Medicare Wellness Visit. I appreciate your ongoing commitment to your health goals. Please review the following plan we discussed and let me know if I can assist you in the future.   Screening recommendations/referrals: Colonoscopy: declined Mammogram: scheduled 01/02/2021 Bone Density: Up to date, completed 01/02/2020, due 12/2021 Recommended yearly ophthalmology/optometry visit for glaucoma screening and checkup Recommended yearly dental visit for hygiene and checkup  Vaccinations: Influenza vaccine: Up to date, completed 07/22/2020, due 04/2021 Pneumococcal vaccine: Up to date, completed 07/22/2020, due 07/22/2021 Tdap vaccine: Up to date, completed 04/07/2015, due 03/2025 Shingles vaccine: Completed series   Covid-19:2 doses completed, booster due. Patient will get after completing her mammogram.  Advanced directives: Advance directive discussed with you today. Even though you declined this today please call our office should you change your mind and we can give you the proper paperwork for you to fill out.   Conditions/risks identified: diabetes, hypertension  Next appointment: Follow up in one year for your annual wellness visit    Preventive Care 23 Years and Older, Female Preventive care refers to lifestyle choices and visits with your health care provider that can promote health and wellness. What does preventive care include?  A yearly physical exam. This is also called an annual well check.  Dental exams once or twice a year.  Routine eye exams. Ask your health care provider how often you should have your eyes checked.  Personal lifestyle choices, including:  Daily care of your teeth and gums.  Regular physical activity.  Eating a healthy diet.  Avoiding tobacco and drug use.  Limiting alcohol use.  Practicing safe sex.  Taking low-dose aspirin every day.  Taking vitamin and mineral supplements as  recommended by your health care provider. What happens during an annual well check? The services and screenings done by your health care provider during your annual well check will depend on your age, overall health, lifestyle risk factors, and family history of disease. Counseling  Your health care provider may ask you questions about your:  Alcohol use.  Tobacco use.  Drug use.  Emotional well-being.  Home and relationship well-being.  Sexual activity.  Eating habits.  History of falls.  Memory and ability to understand (cognition).  Work and work Statistician.  Reproductive health. Screening  You may have the following tests or measurements:  Height, weight, and BMI.  Blood pressure.  Lipid and cholesterol levels. These may be checked every 5 years, or more frequently if you are over 9 years old.  Skin check.  Lung cancer screening. You may have this screening every year starting at age 61 if you have a 30-pack-year history of smoking and currently smoke or have quit within the past 15 years.  Fecal occult blood test (FOBT) of the stool. You may have this test every year starting at age 7.  Flexible sigmoidoscopy or colonoscopy. You may have a sigmoidoscopy every 5 years or a colonoscopy every 10 years starting at age 75.  Hepatitis C blood test.  Hepatitis B blood test.  Sexually transmitted disease (STD) testing.  Diabetes screening. This is done by checking your blood sugar (glucose) after you have not eaten for a while (fasting). You may have this done every 1-3 years.  Bone density scan. This is done to screen for osteoporosis. You may have this done starting at age 36.  Mammogram. This may be done every 1-2 years. Talk to your health care provider  about how often you should have regular mammograms. Talk with your health care provider about your test results, treatment options, and if necessary, the need for more tests. Vaccines  Your health care  provider may recommend certain vaccines, such as:  Influenza vaccine. This is recommended every year.  Tetanus, diphtheria, and acellular pertussis (Tdap, Td) vaccine. You may need a Td booster every 10 years.  Zoster vaccine. You may need this after age 74.  Pneumococcal 13-valent conjugate (PCV13) vaccine. One dose is recommended after age 40.  Pneumococcal polysaccharide (PPSV23) vaccine. One dose is recommended after age 30. Talk to your health care provider about which screenings and vaccines you need and how often you need them. This information is not intended to replace advice given to you by your health care provider. Make sure you discuss any questions you have with your health care provider. Document Released: 09/26/2015 Document Revised: 05/19/2016 Document Reviewed: 07/01/2015 Elsevier Interactive Patient Education  2017 [place] Prevention in the Home Falls can cause injuries. They can happen to people of all ages. There are many things you can do to make your home safe and to help prevent falls. What can I do on the outside of my home?  Regularly fix the edges of walkways and driveways and fix any cracks.  Remove anything that might make you trip as you walk through a door, such as a raised step or threshold.  Trim any bushes or trees on the path to your home.  Use bright outdoor lighting.  Clear any walking paths of anything that might make someone trip, such as rocks or tools.  Regularly check to see if handrails are loose or broken. Make sure that both sides of any steps have handrails.  Any raised decks and porches should have guardrails on the edges.  Have any leaves, snow, or ice cleared regularly.  Use sand or salt on walking paths during winter.  Clean up any spills in your garage right away. This includes oil or grease spills. What can I do in the bathroom?  Use night lights.  Install grab bars by the toilet and in the tub and shower. Do  not use towel bars as grab bars.  Use non-skid mats or decals in the tub or shower.  If you need to sit down in the shower, use a plastic, non-slip stool.  Keep the floor dry. Clean up any water that spills on the floor as soon as it happens.  Remove soap buildup in the tub or shower regularly.  Attach bath mats securely with double-sided non-slip rug tape.  Do not have throw rugs and other things on the floor that can make you trip. What can I do in the bedroom?  Use night lights.  Make sure that you have a light by your bed that is easy to reach.  Do not use any sheets or blankets that are too big for your bed. They should not hang down onto the floor.  Have a firm chair that has side arms. You can use this for support while you get dressed.  Do not have throw rugs and other things on the floor that can make you trip. What can I do in the kitchen?  Clean up any spills right away.  Avoid walking on wet floors.  Keep items that you use a lot in easy-to-reach places.  If you need to reach something above you, use a strong step stool that has a grab bar.  Keep electrical cords out of the way.  Do not use floor polish or wax that makes floors slippery. If you must use wax, use non-skid floor wax.  Do not have throw rugs and other things on the floor that can make you trip. What can I do with my stairs?  Do not leave any items on the stairs.  Make sure that there are handrails on both sides of the stairs and use them. Fix handrails that are broken or loose. Make sure that handrails are as long as the stairways.  Check any carpeting to make sure that it is firmly attached to the stairs. Fix any carpet that is loose or worn.  Avoid having throw rugs at the top or bottom of the stairs. If you do have throw rugs, attach them to the floor with carpet tape.  Make sure that you have a light switch at the top of the stairs and the bottom of the stairs. If you do not have them,  ask someone to add them for you. What else can I do to help prevent falls?  Wear shoes that:  Do not have high heels.  Have rubber bottoms.  Are comfortable and fit you well.  Are closed at the toe. Do not wear sandals.  If you use a stepladder:  Make sure that it is fully opened. Do not climb a closed stepladder.  Make sure that both sides of the stepladder are locked into place.  Ask someone to hold it for you, if possible.  Clearly mark and make sure that you can see:  Any grab bars or handrails.  First and last steps.  Where the edge of each step is.  Use tools that help you move around (mobility aids) if they are needed. These include:  Canes.  Walkers.  Scooters.  Crutches.  Turn on the lights when you go into a dark area. Replace any light bulbs as soon as they burn out.  Set up your furniture so you have a clear path. Avoid moving your furniture around.  If any of your floors are uneven, fix them.  If there are any pets around you, be aware of where they are.  Review your medicines with your doctor. Some medicines can make you feel dizzy. This can increase your chance of falling. Ask your doctor what other things that you can do to help prevent falls. This information is not intended to replace advice given to you by your health care provider. Make sure you discuss any questions you have with your health care provider. Document Released: 06/26/2009 Document Revised: 02/05/2016 Document Reviewed: 10/04/2014 Elsevier Interactive Patient Education  2017 [employer].

## 2021-01-02 ENCOUNTER — Other Ambulatory Visit: Payer: Self-pay

## 2021-01-02 ENCOUNTER — Ambulatory Visit
Admission: RE | Admit: 2021-01-02 | Discharge: 2021-01-02 | Disposition: A | Discharge status: Home / Self Care | Payer: PPO | Source: Ambulatory Visit | Attending: Primary Care | Admitting: Primary Care

## 2021-01-02 DIAGNOSIS — R921 Mammographic calcification found on diagnostic imaging of breast: Secondary | ICD-10-CM | POA: Diagnosis not present

## 2021-01-02 DIAGNOSIS — R922 Inconclusive mammogram: Secondary | ICD-10-CM | POA: Diagnosis not present

## 2021-01-21 ENCOUNTER — Other Ambulatory Visit: Payer: Self-pay

## 2021-01-21 ENCOUNTER — Encounter: Payer: Self-pay | Admitting: Primary Care

## 2021-01-21 ENCOUNTER — Ambulatory Visit (INDEPENDENT_AMBULATORY_CARE_PROVIDER_SITE_OTHER): Payer: PPO | Admitting: Primary Care

## 2021-01-21 ENCOUNTER — Other Ambulatory Visit: Payer: Self-pay | Admitting: Primary Care

## 2021-01-21 VITALS — BP 160/82 | HR 68 | Temp 98.4°F | Ht 67.0 in | Wt 171.0 lb

## 2021-01-21 DIAGNOSIS — I10 Essential (primary) hypertension: Secondary | ICD-10-CM

## 2021-01-21 DIAGNOSIS — K130 Diseases of lips: Secondary | ICD-10-CM | POA: Diagnosis not present

## 2021-01-21 DIAGNOSIS — E119 Type 2 diabetes mellitus without complications: Secondary | ICD-10-CM | POA: Diagnosis not present

## 2021-01-21 DIAGNOSIS — J3089 Other allergic rhinitis: Secondary | ICD-10-CM

## 2021-01-21 LAB — POCT GLYCOSYLATED HEMOGLOBIN (HGB A1C): Hemoglobin A1C: 6.7 % — AB (ref 4.0–5.6)

## 2021-01-21 MED ORDER — MUPIROCIN CALCIUM 2 % EX CREA: 1.0000 "application " | TOPICAL_CREAM | Freq: Two times a day (BID) | CUTANEOUS | 0 refills | Status: DC

## 2021-01-21 MED ORDER — LISINOPRIL 20 MG PO TABS: 20.0000 mg | ORAL_TABLET | Freq: Every day | ORAL | 3 refills | Status: DC

## 2021-01-21 MED ORDER — FLUTICASONE PROPIONATE 50 MCG/ACT NA SUSP: 1.0000 | Freq: Two times a day (BID) | NASAL | 0 refills | Status: DC

## 2021-01-21 MED ORDER — CETIRIZINE HCL 10 MG PO TABS: 10.0000 mg | ORAL_TABLET | Freq: Every day | ORAL | 0 refills | Status: AC

## 2021-01-21 NOTE — Patient Instructions (Addendum)
We increased your dose of lisinopril (blood pressure medication) to 20 mg. I sent a new prescription to the pharmacy.  Monitor and notify me if you continue to see readings at or above 140/90.  You may apply the mupirocin cream to the corners of your mouth twice daily as needed. Please update me.  Stop taking Claritin for allergies. Start cetirizine (Zyrtec) once nightly for allergies.  Nasal Congestion/Ear Pressure/Sinus Pressure: Try using Flonase (fluticasone) nasal spray. Instill 1 spray in each nostril twice daily.   Please schedule a follow up appointment in 6 months for your physical.   It was a pleasure to see you today!

## 2021-01-21 NOTE — Assessment & Plan Note (Signed)
Chronic, ongoing, no improvement with Allegra and Claritin.  Rx for Zyrtec 10 mg and Flonase sent to pharmacy. She will update.

## 2021-01-21 NOTE — Assessment & Plan Note (Signed)
Above goal in the office today, also with prior reading and home readings.  Increase lisinopril to 20 mg, new Rx sent to pharmacy, she will monitor readings and update if readings remain at or above 140/90.

## 2021-01-21 NOTE — Progress Notes (Signed)
Subjective:    Patient ID: Megan [name], female    DOB: [birth year], 70 y.o.   MRN: [id number]  HPI  Megan [name] is a very pleasant 70 y.o. female with a history of type 2 diabetes, hypertension, congential spondylolisthesis of lumbar region, paraplegia, UTI who presents today for follow up.  1) Type 2 Diabetes:  Current medications include: Metformin 500 mg BID  She is checking her blood glucose 1-2 times daily and is getting readings of 120's-130's in the AM fasting.   Last A1C: 6.5 in November 2021, 6.7 today Last Eye Exam: UTD Last Foot Exam: Due Pneumonia Vaccination: UTD ACE/ARB: Lisinopril  Statin: Lipitor   2) Essential Hypertension: Currently managed on lisinopril 10 mg. She's been checking her BP at home which is ranging 140's-160's/70's-80's.   She endorses a healthy diet. Some frozen/processed foods. She is compliant to her lisinopril 10 mg.   3) Allergies: Chronic for years, symptoms include runny nose, post nasal drip which occurs in the morning and afternoon. She's taken Allegra without improvement. She will take an Advil Cold and Sinus with resolve. She's also tried Claritin without improvement.   4) Dry Mouth: Cracked and dry mouth to the corners for years, bothersome. She's not tried anything OTC for treatment.   BP Readings from Last 3 Encounters:  01/21/21 (!) 160/82  10/01/20 (!) 153/72  09/15/20 120/80      Review of Systems  Eyes: Negative for visual disturbance.  Cardiovascular: Negative for chest pain.  Neurological: Positive for numbness.         Past Medical History:  Diagnosis Date  . Arthritis    "hands, back" (07/26/2017)  . Constipation due to pain medication   . Dyspnea    W/ PHYS CONDITION   . GERD (gastroesophageal reflux disease)   . History of bronchitis    "not since I quit smoking" (07/26/2017)  . History of kidney stones   . History of shingles   . Hyperlipidemia    takes Fish Oil daily  . Hypertension   .  Neuromuscular disorder (HCC)    tingling toes  . 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[place]) 12/15/2016  . Pneumonia 2009  . PONV (postoperative nausea and vomiting)   . Restless leg   . Type 2 diabetes mellitus ([place])   . Uterine cancer ([place]) 1979   S/P hysterectomy  . Weakness    numbness and tingling in both feet r/t back    Social History   Socioeconomic History  . Marital status: Married    Spouse name: [name]  . Number of children: 1  . Years of education: 80  . Highest education level: Not on file  Occupational History  . Not on file  Tobacco Use  . Smoking status: Former Smoker    Packs/day: 1.50    Years: 53.00    Pack years: 79.50    Types: Cigarettes    Quit date: 05/13/2006    Years since quitting: 14.7  . Smokeless tobacco: Never Used  Vaping Use  . Vaping Use: Never used  Substance and Sexual Activity  . Alcohol use: Yes    Comment: 07/26/2017 "glass of wine once/wk if not on RX"  . Drug use: No  . Sexual activity: Not Currently    Birth control/protection: Surgical  Other Topics Concern  . Not on file  Social History Narrative   Lives w/ husband   Married.   1 child, 1 grandchildren.   Retired. Once worked for  [employer].   Enjoys reading.    Social Determinants of Health   Financial Resource Strain: Low Risk   . Difficulty of Paying Living Expenses: Not hard at all  Food Insecurity: No Food Insecurity  . Worried About Charity fundraiser in the Last Year: Never true  . Ran Out of Food in the Last Year: Never true  Transportation Needs: No Transportation Needs  . Lack of Transportation (Medical): No  . Lack of Transportation (Non-Medical): No  Physical Activity: Inactive  . Days of Exercise per Week: 0 days  . Minutes of Exercise per Session: 0 min  Stress: No Stress Concern Present  . Feeling of Stress : Not at all  Social Connections: Not on file  Intimate Partner Violence: Not At Risk  . Fear of Current or Ex-Partner: No  . Emotionally  Abused: No  . Physically Abused: No  . Sexually Abused: No    Past Surgical History:  Procedure Laterality Date  . ABDOMINAL EXPOSURE N/A 07/26/2017   Procedure: ABDOMINAL EXPOSURE;  Surgeon: Angelia Mould, MD;  Location: [facility];  Service: Vascular;  Laterality: N/A;  . ANTERIOR CERVICAL DECOMP/DISCECTOMY FUSION  2001  . ANTERIOR LUMBAR FUSION N/A 07/26/2017   Procedure: Lumbar five-Sacral one Anterior lumbar interbody fusion with Dr. Deitra Mayo for approach;  Surgeon: Ditty, Kevan Ny, MD;  Location: [facility];  Service: Neurosurgery;  Laterality: N/A;  . APPLICATION OF ROBOTIC ASSISTANCE FOR SPINAL PROCEDURE  12/19/2016   Procedure: APPLICATION OF ROBOTIC ASSISTANCE FOR SPINAL PROCEDURE;  Surgeon: Kevan Ny Ditty, MD;  Location: [facility];  Service: Neurosurgery;;  . APPLICATION OF ROBOTIC ASSISTANCE FOR SPINAL PROCEDURE N/A 07/26/2017   Procedure: APPLICATION OF ROBOTIC ASSISTANCE FOR SPINAL PROCEDURE;  Surgeon: Ditty, Kevan Ny, MD;  Location: [facility];  Service: Neurosurgery;  Laterality: N/A;  . APPLICATION OF ROBOTIC ASSISTANCE FOR SPINAL PROCEDURE N/A 09/02/2017   Procedure: APPLICATION OF ROBOTIC ASSISTANCE FOR SPINAL PROCEDURE;  Surgeon: Ditty, Kevan Ny, MD;  Location: [facility];  Service: Neurosurgery;  Laterality: N/A;  . BACK SURGERY    . BLADDER SUSPENSION  1991   tack  . INGUINAL HERNIA REPAIR Right 1991  . LUMBAR FUSION  2016; 2017; 07/26/2017   L4-5; L2-3; L5-S1  . POSTERIOR LUMBAR FUSION 4 WITH HARDWARE REMOVAL N/A 09/02/2017   Procedure: Lumbar three-four redo laminectomy; Repositioning of Left Sacral two screw; Extension of lumbar fusion to Thoracic twelve;  Surgeon: Ditty, Kevan Ny, MD;  Location: [facility];  Service: Neurosurgery;  Laterality: N/A;  . TONSILLECTOMY AND ADENOIDECTOMY  1959  . TUMOR EXCISION     WERTHIN'S TUMORS BOTH SIDES OF NECK  . VAGINAL HYSTERECTOMY  1979    Family History  Problem Relation Age of Onset  . Diabetes  Mother   . Dementia Mother   . Cirrhosis Mother        Non alcoholic  . COPD Father   . Diabetes Brother     Allergies  Allergen Reactions  . Shellfish Allergy Anaphylaxis, Swelling and Other (See Comments)    Tongue swells  . Oxycodone Nausea Only  . Penicillins Rash and Other (See Comments)    Has patient had a PCN reaction causing immediate rash, facial/tongue/throat swelling, SOB or lightheadedness with hypotension: Yes Has patient had a PCN reaction causing severe rash involving mucus membranes or skin necrosis: No Has patient had a PCN reaction that required hospitalization No Has patient had a PCN reaction occurring within the last 10 years: No If all  of the above answers are "NO", then may proceed with Cephalosporin use.    Current Outpatient Medications on File Prior to Visit  Medication Sig Dispense Refill  . atorvastatin (LIPITOR) 20 MG tablet TAKE 1 TABLET(20 MG) BY MOUTH EVERY EVENING FOR CHOLESTEROL 90 tablet 1  . baclofen (LIORESAL) 10 MG tablet TAKE 1 TABLET(10 MG) BY MOUTH THREE TIMES DAILY AS NEEDED FOR MUSCLE SPASMS 180 tablet 3  . Biotin 5000 MCG CAPS Take 5,000 mcg by mouth every morning.     . celecoxib (CELEBREX) 200 MG capsule TAKE 1 CAPSULE(200 MG) BY MOUTH TWICE DAILY AS NEEDED FOR MODERATE PAIN 180 capsule 1  . cholecalciferol (VITAMIN D) 1000 units tablet Take 2 tablets (2,000 Units total) by mouth daily after lunch. (Patient taking differently: Take 1,000 Units by mouth daily after lunch.) 30 tablet 0  . Coenzyme Q10 (COQ10) 100 MG CAPS Take 100 mg daily by mouth.     . esomeprazole (NEXIUM 24HR) 20 MG capsule Take 1 capsule (20 mg total) by mouth daily at 12 noon. 90 capsule 1  . gabapentin (NEURONTIN) 300 MG capsule TAKE ONE CAPSULE BY MOUTH THREE TIMES DAILY 270 capsule 0  . Garlic 520 MG CAPS Take 500 mg 2 (two) times daily by mouth.    Marland Kitchen lisinopril (ZESTRIL) 10 MG tablet TAKE 1 TABLET(10 MG) BY MOUTH DAILY FOR BLOOD PRESSURE 90 tablet 2  . metFORMIN  (GLUCOPHAGE) 500 MG tablet TAKE 1 TABLET BY MOUTH TWICE DAILY 180 tablet 1  . oxybutynin (OXYTROL) 3.9 MG/24HR Place 1 patch onto the skin 5 days.     Marland Kitchen senna-docusate (SENOKOT-S) 8.6-50 MG tablet Take 2 tablets by mouth 2 (two) times daily. (Patient taking differently: Take 1-2 tablets by mouth daily as needed for mild constipation.)     No current facility-administered medications on file prior to visit.    BP (!) 160/82   Pulse 68   Temp 98.4 F (36.9 C) (Temporal)   Ht 5\' 7"  (1.702 m)   Wt 171 lb (77.6 kg)   SpO2 98%   BMI 26.78 kg/m  Objective:   Physical Exam Cardiovascular:     Rate and Rhythm: Normal rate and regular rhythm.  Pulmonary:     Effort: Pulmonary effort is normal.     Breath sounds: Normal breath sounds.  Musculoskeletal:     Cervical back: Neck supple.  Skin:    General: Skin is warm and dry.     Comments: Cracking noted to corners of oral cavity on each side. No bleeding. Mild.            Assessment & Plan:      This visit occurred during the SARS-CoV-2 public health emergency.  Safety protocols were in place, including screening questions prior to the visit, additional usage of staff PPE, and extensive cleaning of exam room while observing appropriate contact time as indicated for disinfecting solutions.

## 2021-01-21 NOTE — Assessment & Plan Note (Signed)
Well controlled today with A1C of 6.7.  Continue metformin 500 mg BID.   Foot exam today. Eye exam UTD. Pneumonia vaccine UTD. Managed on statin and ACE.  Follow up in 6 months.

## 2021-01-21 NOTE — Assessment & Plan Note (Signed)
Noted on exam. Will trial mupirocin cream, she will update.

## 2021-01-29 ENCOUNTER — Other Ambulatory Visit: Payer: Self-pay | Admitting: Primary Care

## 2021-01-29 DIAGNOSIS — G894 Chronic pain syndrome: Secondary | ICD-10-CM

## 2021-01-29 DIAGNOSIS — M4727 Other spondylosis with radiculopathy, lumbosacral region: Secondary | ICD-10-CM

## 2021-02-02 ENCOUNTER — Other Ambulatory Visit: Payer: Self-pay | Admitting: Primary Care

## 2021-02-02 DIAGNOSIS — M4727 Other spondylosis with radiculopathy, lumbosacral region: Secondary | ICD-10-CM

## 2021-02-02 DIAGNOSIS — M4714 Other spondylosis with myelopathy, thoracic region: Secondary | ICD-10-CM

## 2021-02-12 DIAGNOSIS — E119 Type 2 diabetes mellitus without complications: Secondary | ICD-10-CM | POA: Diagnosis not present

## 2021-02-12 LAB — HM DIABETES EYE EXAM

## 2021-02-20 ENCOUNTER — Other Ambulatory Visit: Payer: Self-pay | Admitting: Primary Care

## 2021-02-20 DIAGNOSIS — E119 Type 2 diabetes mellitus without complications: Secondary | ICD-10-CM

## 2021-03-12 DIAGNOSIS — H2513 Age-related nuclear cataract, bilateral: Secondary | ICD-10-CM | POA: Diagnosis not present

## 2021-03-12 DIAGNOSIS — H16223 Keratoconjunctivitis sicca, not specified as Sjogren's, bilateral: Secondary | ICD-10-CM | POA: Diagnosis not present

## 2021-03-30 ENCOUNTER — Encounter: Payer: Self-pay | Admitting: Primary Care

## 2021-04-02 DIAGNOSIS — H16223 Keratoconjunctivitis sicca, not specified as Sjogren's, bilateral: Secondary | ICD-10-CM | POA: Diagnosis not present

## 2021-05-03 ENCOUNTER — Other Ambulatory Visit: Payer: Self-pay | Admitting: Primary Care

## 2021-05-03 DIAGNOSIS — E785 Hyperlipidemia, unspecified: Secondary | ICD-10-CM

## 2021-05-04 NOTE — Telephone Encounter (Signed)
Can we change her visit type to CPE from follow up? She is scheduled on 07/31/21.

## 2021-05-05 NOTE — Telephone Encounter (Signed)
Changed appt to CPE as requested.

## 2021-05-07 ENCOUNTER — Other Ambulatory Visit: Payer: Self-pay

## 2021-05-07 ENCOUNTER — Encounter: Payer: Self-pay | Admitting: Nurse Practitioner

## 2021-05-07 ENCOUNTER — Ambulatory Visit (INDEPENDENT_AMBULATORY_CARE_PROVIDER_SITE_OTHER): Payer: PPO | Admitting: Nurse Practitioner

## 2021-05-07 VITALS — BP 142/78 | HR 91 | Temp 97.6°F | Ht 67.0 in | Wt 175.0 lb

## 2021-05-07 DIAGNOSIS — N3001 Acute cystitis with hematuria: Secondary | ICD-10-CM | POA: Diagnosis not present

## 2021-05-07 DIAGNOSIS — R350 Frequency of micturition: Secondary | ICD-10-CM | POA: Diagnosis not present

## 2021-05-07 DIAGNOSIS — I10 Essential (primary) hypertension: Secondary | ICD-10-CM | POA: Diagnosis not present

## 2021-05-07 HISTORY — DX: Acute cystitis with hematuria: N30.01

## 2021-05-07 LAB — POCT URINALYSIS DIP (MANUAL ENTRY)
Bilirubin, UA: NEGATIVE
Glucose, UA: 250 mg/dL — AB
Ketones, POC UA: NEGATIVE mg/dL
Nitrite, UA: POSITIVE — AB
Spec Grav, UA: 1.025 (ref 1.010–1.025)
Urobilinogen, UA: 0.2 E.U./dL
pH, UA: 5.5 (ref 5.0–8.0)

## 2021-05-07 MED ORDER — CIPROFLOXACIN HCL 500 MG PO TABS: 500.0000 mg | ORAL_TABLET | Freq: Two times a day (BID) | ORAL | 0 refills | Status: AC

## 2021-05-07 NOTE — Assessment & Plan Note (Signed)
Patient's blood pressure was elevated at first in office visit.  Patient did state that it does take some time and quite a bit of effort for her to get back into the exam rooms.  I did recheck her blood pressure prior to her leaving and she was within normal limits.  We will not change any medications at this office visit.  Patient is instructed to check her blood pressure approximately 3 times a week.  She acknowledged

## 2021-05-07 NOTE — Assessment & Plan Note (Signed)
Patient has history of the same.  Looking back over the past few years she has had 3+ urinary tract infections.  Looking at the cultures resistant to Bactrim and intermittent resistance to nitrofurantoin.  Patient has tolerated ciprofloxacin in the past and looking at her cultures each time it has been sensitive to ciprofloxacin.  Will start patient with 500 mg of ciprofloxacin twice daily for 5 days most recent EKG QTC was 419.  She is not on any medications to prolong her QTC.  Did send off her urine culture to make sure treating appropriately. Start taking ciprofloxacin 500 mg twice a day for 5 days.

## 2021-05-07 NOTE — Patient Instructions (Signed)
Sent medication to pharmacy. Follow up if needed

## 2021-05-07 NOTE — Progress Notes (Signed)
Acute Office Visit  Subjective:    Patient ID: Megan [name], female    DOB: September 13, 1951, 70 y.o.   MRN: [id number]  Chief Complaint  Patient presents with   Urinary Tract Infection    HPI Patient is in today for urinary complaint:  History of urinary tract infections. States she use to get them twice a year, then yearly. Gets them on occasion now.  Frequency, suprapubic pain, odor, increased urge incontinence . No hematuria paitnet has been using cranberry juice and go-less. Has not taken the Go-Less today. Does wear an oxytrol patch for her urinary problems   Elevated BP:Has not taken lisinopril today. States she normally takes it after supper. Checks it once a week at home number run between 130s/60-70s. 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   Pneumonia 2009   PONV (postoperative nausea and vomiting)    Restless leg    Type 2 diabetes mellitus ([place])    Uterine cancer ([place]) 1979   S/P hysterectomy   Weakness    numbness and tingling in both feet r/t back    Past Surgical History:  Procedure Laterality Date   ABDOMINAL EXPOSURE N/A 07/26/2017   Procedure: ABDOMINAL EXPOSURE;  Surgeon: Angelia Mould, MD;  Location: [place];  Service: Vascular;  Laterality: N/A;   ANTERIOR CERVICAL DECOMP/DISCECTOMY FUSION  2001   ANTERIOR LUMBAR FUSION N/A 07/26/2017   Procedure: Lumbar five-Sacral one Anterior lumbar interbody fusion with Dr. Deitra Mayo for approach;  Surgeon: Ditty, Kevan Ny, MD;   Location: [place];  Service: Neurosurgery;  Laterality: N/A;   APPLICATION OF ROBOTIC ASSISTANCE FOR SPINAL PROCEDURE  12/19/2016   Procedure: APPLICATION OF ROBOTIC ASSISTANCE FOR SPINAL PROCEDURE;  Surgeon: Kevan Ny Ditty, MD;  Location: [place];  Service: Neurosurgery;;   APPLICATION OF ROBOTIC ASSISTANCE FOR SPINAL PROCEDURE N/A 07/26/2017   Procedure: APPLICATION OF ROBOTIC ASSISTANCE FOR SPINAL PROCEDURE;  Surgeon: Ditty, Kevan Ny, MD;  Location: [place];  Service: Neurosurgery;  Laterality: N/A;   APPLICATION OF ROBOTIC ASSISTANCE FOR SPINAL PROCEDURE N/A 09/02/2017   Procedure: APPLICATION OF ROBOTIC ASSISTANCE FOR SPINAL PROCEDURE;  Surgeon: Ditty, Kevan Ny, MD;  Location: [place];  Service: Neurosurgery;  Laterality: N/A;   BACK SURGERY     BLADDER SUSPENSION  1991   tack   INGUINAL HERNIA REPAIR Right 1991   LUMBAR FUSION  2016; 2017; 07/26/2017   L4-5; L2-3; L5-S1   POSTERIOR LUMBAR FUSION 4 WITH HARDWARE REMOVAL N/A 09/02/2017   Procedure: Lumbar three-four redo laminectomy; Repositioning of Left Sacral two screw; Extension of lumbar fusion to Thoracic twelve;  Surgeon: Ditty, Kevan Ny, MD;  Location: [place];  Service: Neurosurgery;  Laterality: N/A;   TONSILLECTOMY AND ADENOIDECTOMY  1959   TUMOR EXCISION     WERTHIN'S TUMORS BOTH SIDES OF NECK   VAGINAL HYSTERECTOMY  1979    Family History  Problem Relation Age of Onset  Diabetes Mother    Dementia Mother    Cirrhosis Mother        Non alcoholic   COPD Father    Diabetes Brother     Social History   Socioeconomic History   Marital status: Married    Spouse name: [name]   Number of children: 1   Years of education: 12   Highest education level: Not on file  Occupational History   Not on file  Tobacco Use   Smoking status: Former    Packs/day: 1.50    Years: 53.00    Pack years: 79.50    Types: Cigarettes    Quit date: 05/13/2006    Years since quitting: 14.9   Smokeless tobacco: Never   Vaping Use   Vaping Use: Never used  Substance and Sexual Activity   Alcohol use: Yes    Comment: 07/26/2017 "glass of wine once/wk if not on RX"   Drug use: No   Sexual activity: Not Currently    Birth control/protection: Surgical  Other Topics Concern   Not on file  Social History Narrative   Lives w/ husband   Married.   1 child, 1 grandchildren.   Retired. Once worked for [employer].   Enjoys reading.    Social Determinants of Health   Financial Resource Strain: Low Risk    Difficulty of Paying Living Expenses: Not hard at all  Food Insecurity: No Food Insecurity   Worried About Charity fundraiser in the Last Year: Never true   [place] in the Last Year: Never true  Transportation Needs: No Transportation Needs   Lack of Transportation (Medical): No   Lack of Transportation (Non-Medical): No  Physical Activity: Inactive   Days of Exercise per Week: 0 days   Minutes of Exercise per Session: 0 min  Stress: No Stress Concern Present   Feeling of Stress : Not at all  Social Connections: Not on file  Intimate Partner Violence: Not At Risk   Fear of Current or Ex-Partner: No   Emotionally Abused: No   Physically Abused: No   Sexually Abused: No    Outpatient Medications Prior to Visit  Medication Sig Dispense Refill   atorvastatin (LIPITOR) 20 MG tablet TAKE 1 TABLET(20 MG) BY MOUTH EVERY EVENING FOR CHOLESTEROL 90 tablet 0   baclofen (LIORESAL) 10 MG tablet TAKE 1 TABLET(10 MG) BY MOUTH THREE TIMES DAILY AS NEEDED FOR MUSCLE SPASMS 180 tablet 3   Biotin 5000 MCG CAPS Take 5,000 mcg by mouth every morning.      celecoxib (CELEBREX) 200 MG capsule TAKE 1 CAPSULE(200 MG) BY MOUTH TWICE DAILY AS NEEDED FOR MODERATE PAIN 180 capsule 1   cetirizine (ZYRTEC) 10 MG tablet Take 1 tablet (10 mg total) by mouth daily. For allergies 90 tablet 0   cholecalciferol (VITAMIN D) 1000 units tablet Take 2 tablets (2,000 Units total) by mouth daily after lunch. (Patient taking  differently: Take 1,000 Units by mouth daily after lunch.) 30 tablet 0   Coenzyme Q10 (COQ10) 100 MG CAPS Take 100 mg daily by mouth.      esomeprazole (NEXIUM 24HR) 20 MG capsule Take 1 capsule (20 mg total) by mouth daily at 12 noon. 90 capsule 1   fluticasone (FLONASE) 50 MCG/ACT nasal spray SHAKE LIQUID AND USE 1 SPRAY IN EACH NOSTRIL TWICE DAILY 48 g 1   gabapentin (NEURONTIN) 300 MG capsule Take 1 capsule (300 mg total) by mouth 3 (three) times daily. For pain.  465 capsule 0   Garlic 681 MG CAPS Take 500 mg 2 (two) times daily by mouth.     lisinopril (ZESTRIL) 20 MG tablet Take 1 tablet (20 mg total) by mouth daily. For blood pressure. 90 tablet 3   metFORMIN (GLUCOPHAGE) 500 MG tablet TAKE 1 TABLET BY MOUTH TWICE DAILY for diabetes. 180 tablet 3   mupirocin cream (BACTROBAN) 2 % Apply 1 application topically 2 (two) times daily. 15 g 0   oxybutynin (OXYTROL) 3.9 MG/24HR Place 1 patch onto the skin 5 days.      senna-docusate (SENOKOT-S) 8.6-50 MG tablet Take 2 tablets by mouth 2 (two) times daily. (Patient taking differently: Take 1-2 tablets by mouth daily as needed for mild constipation.)     No facility-administered medications prior to visit.    Allergies  Allergen Reactions   Shellfish Allergy Anaphylaxis, Swelling and Other (See Comments)    Tongue swells   Oxycodone Nausea Only   Penicillins Rash and Other (See Comments)    Has patient had a PCN reaction causing immediate rash, facial/tongue/throat swelling, SOB or lightheadedness with hypotension: Yes Has patient had a PCN reaction causing severe rash involving mucus membranes or skin necrosis: No Has patient had a PCN reaction that required hospitalization No Has patient had a PCN reaction occurring within the last 10 years: No If all of the above answers are "NO", then may proceed with Cephalosporin use.    Review of Systems  Constitutional:  Negative for chills and fever.  Respiratory:  Negative for shortness of  breath.   Cardiovascular:  Negative for chest pain.  Gastrointestinal:  Positive for nausea. Negative for diarrhea and vomiting.  Genitourinary:  Positive for dysuria, frequency and urgency. Negative for difficulty urinating, hematuria, pelvic pain, vaginal bleeding, vaginal discharge and vaginal pain.  Skin:  Negative for color change and pallor.      Objective:    Physical Exam Constitutional:      Appearance: Normal appearance.  Cardiovascular:     Rate and Rhythm: Normal rate and regular rhythm.  Pulmonary:     Effort: Pulmonary effort is normal.     Breath sounds: Normal breath sounds.  Abdominal:     General: Bowel sounds are normal.     Palpations: Abdomen is soft. There is no mass.     Tenderness: There is no abdominal tenderness. There is no right CVA tenderness or left CVA tenderness.  Musculoskeletal:     Comments: Patient uses rollator in office   Skin:    General: Skin is warm.  Neurological:     Mental Status: She is alert.  Psychiatric:        Mood and Affect: Mood normal.        Behavior: Behavior normal.        Thought Content: Thought content normal.        Judgment: Judgment normal.    BP (!) 174/82   Pulse 91   Temp 97.6 F (36.4 C) (Temporal)   Ht 5\' 7"  (1.702 m)   Wt 175 lb (79.4 kg)   SpO2 96%   BMI 27.41 kg/m  Wt Readings from Last 3 Encounters:  05/07/21 175 lb (79.4 kg)  01/21/21 171 lb (77.6 kg)  10/01/20 174 lb (78.9 kg)    Health Maintenance Due  Topic Date Due   COVID-19 Vaccine (3 - Booster for Pfizer series) 05/29/2020   INFLUENZA VACCINE  04/13/2021    There are no preventive care reminders to display for this  patient.   No results found for: TSH Lab Results  Component Value Date   WBC 5.2 01/22/2020   HGB 12.7 01/22/2020   HCT 37.7 01/22/2020   MCV 86.7 01/22/2020   PLT 204.0 01/22/2020   Lab Results  Component Value Date   NA 137 01/22/2020   K 4.8 01/22/2020   CO2 26 01/22/2020   GLUCOSE 155 (H) 01/22/2020    BUN 14 01/22/2020   CREATININE 0.85 01/22/2020   BILITOT 0.4 01/22/2020   ALKPHOS 81 01/22/2020   AST 19 01/22/2020   ALT 21 01/22/2020   PROT 7.1 01/22/2020   ALBUMIN 4.1 01/22/2020   CALCIUM 9.4 01/22/2020   ANIONGAP 10 09/08/2017   GFR 66.76 01/22/2020   Lab Results  Component Value Date   CHOL 139 01/22/2020   Lab Results  Component Value Date   HDL 37.70 (L) 01/22/2020   Lab Results  Component Value Date   LDLCALC 61 01/24/2018   Lab Results  Component Value Date   TRIG 201.0 (H) 01/22/2020   Lab Results  Component Value Date   CHOLHDL 4 01/22/2020   Lab Results  Component Value Date   HGBA1C 6.7 (A) 01/21/2021       Assessment & Plan:   Problem List Items Addressed This Visit       Cardiovascular and Mediastinum   Essential hypertension    Patient's blood pressure was elevated at first in office visit.  Patient did state that it does take some time and quite a bit of effort for her to get back into the exam rooms.  I did recheck her blood pressure prior to her leaving and she was within normal limits.  We will not change any medications at this office visit.  Patient is instructed to check her blood pressure approximately 3 times a week.  She acknowledged        Genitourinary   Acute cystitis with hematuria    Patient has history of the same.  Looking back over the past few years she has had 3+ urinary tract infections.  Looking at the cultures resistant to Bactrim and intermittent resistance to nitrofurantoin.  Patient has tolerated ciprofloxacin in the past and looking at her cultures each time it has been sensitive to ciprofloxacin.  Will start patient with 500 mg of ciprofloxacin twice daily for 5 days most recent EKG QTC was 419.  She is not on any medications to prolong her QTC.  Did send off her urine culture to make sure treating appropriately. Start taking ciprofloxacin 500 mg twice a day for 5 days.      Relevant Medications   ciprofloxacin  (CIPRO) 500 MG tablet   Other Relevant Orders   Urine Culture   Other Visit Diagnoses     Frequency of urination    -  Primary   Relevant Orders   POCT urinalysis dipstick (Completed)        No orders of the defined types were placed in this encounter.  This visit occurred during the SARS-CoV-2 public health emergency.  Safety protocols were in place, including screening questions prior to the visit, additional usage of staff PPE, and extensive cleaning of exam room while observing appropriate contact time as indicated for disinfecting solutions.   Romilda Garret, NP

## 2021-05-09 LAB — URINE CULTURE
MICRO NUMBER:: 12291233
SPECIMEN QUALITY:: ADEQUATE

## 2021-05-14 ENCOUNTER — Telehealth: Payer: Self-pay

## 2021-05-14 ENCOUNTER — Other Ambulatory Visit: Payer: Self-pay | Admitting: Nurse Practitioner

## 2021-05-14 DIAGNOSIS — N3001 Acute cystitis with hematuria: Secondary | ICD-10-CM

## 2021-05-14 MED ORDER — CIPROFLOXACIN HCL 500 MG PO TABS: 500.0000 mg | ORAL_TABLET | Freq: Two times a day (BID) | ORAL | 0 refills | Status: AC

## 2021-05-14 NOTE — Telephone Encounter (Signed)
Patient advised.

## 2021-05-14 NOTE — Telephone Encounter (Signed)
Spoke with patient and she states symptoms are about 50% better. Pain and burning with urination still, less intense but still present. No new symptoms.

## 2021-05-14 NOTE — Telephone Encounter (Signed)
Vm from pt stating she saw Matt last wk for UTI and given abx.  Says she was told to let him know if she felt like she needed another round.  Pt requests refill of abx stating it doesn't feel like sxs have completely gone away.

## 2021-06-18 ENCOUNTER — Telehealth: Payer: Self-pay | Admitting: Primary Care

## 2021-06-18 NOTE — Telephone Encounter (Signed)
Pt called in stated she would like a RX for Oxybutynin she's been using Oxytrol patch for a long time and feel it's no longer working . Please Advise . Would like a call back [phone]

## 2021-06-19 NOTE — Telephone Encounter (Signed)
She was getting patches over the counter. She was prescribed oxybutynin a long time ago by her GYN but had to stop due to loss of insurance. She has been using patches until recently. I have scheduled appointment with you next week to discuss options for treatment.

## 2021-06-19 NOTE — Telephone Encounter (Signed)
Who has been prescribing the Oxybutynin patches? I haven't been. Have her start by calling whoever prescribes these. Set her up for a virtual visit if she is wanting [place] to take over.

## 2021-06-20 NOTE — Telephone Encounter (Signed)
Noted  

## 2021-06-23 ENCOUNTER — Encounter: Payer: Self-pay | Admitting: Primary Care

## 2021-06-23 ENCOUNTER — Ambulatory Visit (INDEPENDENT_AMBULATORY_CARE_PROVIDER_SITE_OTHER): Payer: PPO | Admitting: Primary Care

## 2021-06-23 ENCOUNTER — Other Ambulatory Visit: Payer: Self-pay

## 2021-06-23 VITALS — BP 148/78 | HR 87 | Temp 97.6°F | Ht 67.0 in | Wt 176.0 lb

## 2021-06-23 DIAGNOSIS — N3281 Overactive bladder: Secondary | ICD-10-CM

## 2021-06-23 MED ORDER — OXYBUTYNIN CHLORIDE ER 5 MG PO TB24
5.0000 mg | ORAL_TABLET | Freq: Every day | ORAL | 0 refills | Status: DC
Start: 2021-06-23 — End: 2021-09-15

## 2021-06-23 NOTE — Patient Instructions (Signed)
Start oxybutynin XL 5 mg once daily for incontinence and overactive bladder.  We will see you next month.   It was a pleasure to see you today!

## 2021-06-23 NOTE — Assessment & Plan Note (Signed)
Chronic, even prior to surgical procedure for lumbar spondylosis and neurogenic bladder.  No UTI symptoms today, verified with patient.  Rx for oxybutynin XL 5 mg sent to pharmacy. She will follow up in 1 month as scheduled.

## 2021-06-23 NOTE — Progress Notes (Signed)
Subjective:    Patient ID: Megan [name], female    DOB: Mar 21, 1951, 70 y.o.   MRN: [id number]  HPI  Megan [name] is a very pleasant 70 y.o. female with a history of hypertension, neurogenic bladder, type 2 diabetes, lumbosacral spondylosis with radiculopathy, chronic pain syndrome who presents today to discuss urinary incontinence.   Chronic dating back years ago, prior to lumbar spondylosis with radiculopathy causing neurogenic bladder. No longer has to self catheterize. Managed on oxybutynin patches for which she has used for years. She purchases these over the counter. Lately she's noticed nocturia (2-3 times nightly), urinary urgency with incontinence. Previously managed on oxybutynin pills and did well, but had to discontinue when her GYN retired.   History of bladder tack and hysterectomy. She denies foul smell, hematuria, dysuria, vaginal symptoms. History of cystitis, last case was in late August 2022, culture positive E coli, symptoms have resolved after treatment.   She would like to resume oxybutynin.    Review of Systems  Gastrointestinal:  Negative for abdominal pain.  Genitourinary:  Negative for dysuria, frequency, hematuria and vaginal discharge.       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   Pneumonia 2009   PONV (postoperative nausea and vomiting)    Restless leg    Type 2 diabetes mellitus (HCC)    Uterine cancer ([place]) 1979   S/P hysterectomy   Weakness    numbness and tingling in both feet r/t  back    Social History   Socioeconomic History   Marital status: Married    Spouse name: [name]   Number of children: 1   Years of education: 12   Highest education level: Not on file  Occupational History   Not on file  Tobacco Use   Smoking status: Former    Packs/day: 1.50    Years: 53.00    Pack years: 79.50    Types: Cigarettes    Quit date: 05/13/2006    Years since quitting: 15.1   Smokeless tobacco: Never  Vaping Use   Vaping Use: Never used  Substance and Sexual Activity   Alcohol use: Yes    Comment: 07/26/2017 "glass of wine once/wk if not on RX"   Drug use: No   Sexual activity: Not Currently    Birth control/protection: Surgical  Other Topics Concern   Not on file  Social History Narrative   Lives w/ husband   Married.   1 child, 1 grandchildren.   Retired. Once worked for [employer].   Enjoys reading.    Social Determinants of Health   Financial Resource Strain: Low Risk    Difficulty of Paying Living Expenses: Not hard at all  Food Insecurity: No Food Insecurity   Worried About Charity fundraiser in the Last Year: Never true   Ran Out of Food in the Last Year: Never true  Transportation  Needs: No Transportation Needs   Lack of Transportation (Medical): No   Lack of Transportation (Non-Medical): No  Physical Activity: Inactive   Days of Exercise per Week: 0 days   Minutes of Exercise per Session: 0 min  Stress: No Stress Concern Present   Feeling of Stress : Not at all  Social Connections: Not on file  Intimate Partner Violence: Not At Risk   Fear of Current or Ex-Partner: No   Emotionally Abused: No   Physically Abused: No   Sexually Abused: No    Past Surgical History:  Procedure Laterality Date   ABDOMINAL EXPOSURE N/A 07/26/2017   Procedure: ABDOMINAL EXPOSURE;  Surgeon: Angelia Mould, MD;  Location: [place];  Service: Vascular;  Laterality: N/A;   ANTERIOR CERVICAL DECOMP/DISCECTOMY FUSION  2001   ANTERIOR LUMBAR FUSION N/A  07/26/2017   Procedure: Lumbar five-Sacral one Anterior lumbar interbody fusion with Dr. Deitra Mayo for approach;  Surgeon: Ditty, Kevan Ny, MD;  Location: [place];  Service: Neurosurgery;  Laterality: N/A;   APPLICATION OF ROBOTIC ASSISTANCE FOR SPINAL PROCEDURE  12/19/2016   Procedure: APPLICATION OF ROBOTIC ASSISTANCE FOR SPINAL PROCEDURE;  Surgeon: Kevan Ny Ditty, MD;  Location: [place];  Service: Neurosurgery;;   APPLICATION OF ROBOTIC ASSISTANCE FOR SPINAL PROCEDURE N/A 07/26/2017   Procedure: APPLICATION OF ROBOTIC ASSISTANCE FOR SPINAL PROCEDURE;  Surgeon: Ditty, Kevan Ny, MD;  Location: [place];  Service: Neurosurgery;  Laterality: N/A;   APPLICATION OF ROBOTIC ASSISTANCE FOR SPINAL PROCEDURE N/A 09/02/2017   Procedure: APPLICATION OF ROBOTIC ASSISTANCE FOR SPINAL PROCEDURE;  Surgeon: Ditty, Kevan Ny, MD;  Location: [place];  Service: Neurosurgery;  Laterality: N/A;   BACK SURGERY     BLADDER SUSPENSION  1991   tack   INGUINAL HERNIA REPAIR Right 1991   LUMBAR FUSION  2016; 2017; 07/26/2017   L4-5; L2-3; L5-S1   POSTERIOR LUMBAR FUSION 4 WITH HARDWARE REMOVAL N/A 09/02/2017   Procedure: Lumbar three-four redo laminectomy; Repositioning of Left Sacral two screw; Extension of lumbar fusion to Thoracic twelve;  Surgeon: Ditty, Kevan Ny, MD;  Location: [place];  Service: Neurosurgery;  Laterality: N/A;   TONSILLECTOMY AND ADENOIDECTOMY  1959   TUMOR EXCISION     WERTHIN'S TUMORS BOTH SIDES OF NECK   VAGINAL HYSTERECTOMY  1979    Family History  Problem Relation Age of Onset   Diabetes Mother    Dementia Mother    Cirrhosis Mother        Non alcoholic   COPD Father    Diabetes Brother     Allergies  Allergen Reactions   Shellfish Allergy Anaphylaxis, Swelling and Other (See Comments)    Tongue swells   Oxycodone Nausea Only   Penicillins Rash and Other (See Comments)    Has patient had a PCN reaction causing immediate rash, facial/tongue/throat  swelling, SOB or lightheadedness with hypotension: Yes Has patient had a PCN reaction causing severe rash involving mucus membranes or skin necrosis: No Has patient had a PCN reaction that required hospitalization No Has patient had a PCN reaction occurring within the last 10 years: No If all of the above answers are "NO", then may proceed with Cephalosporin use.    Current Outpatient Medications on File Prior to Visit  Medication Sig Dispense Refill   atorvastatin (LIPITOR) 20 MG tablet TAKE 1 TABLET(20 MG) BY MOUTH EVERY EVENING FOR CHOLESTEROL 90 tablet 0   baclofen (LIORESAL) 10 MG tablet TAKE 1 TABLET(10 MG) BY MOUTH THREE TIMES DAILY AS NEEDED  FOR MUSCLE SPASMS 180 tablet 3   Biotin 5000 MCG CAPS Take 5,000 mcg by mouth every morning.      celecoxib (CELEBREX) 200 MG capsule TAKE 1 CAPSULE(200 MG) BY MOUTH TWICE DAILY AS NEEDED FOR MODERATE PAIN 180 capsule 1   cetirizine (ZYRTEC) 10 MG tablet Take 1 tablet (10 mg total) by mouth daily. For allergies 90 tablet 0   cholecalciferol (VITAMIN D) 1000 units tablet Take 2 tablets (2,000 Units total) by mouth daily after lunch. (Patient taking differently: Take 1,000 Units by mouth daily after lunch.) 30 tablet 0   Coenzyme Q10 (COQ10) 100 MG CAPS Take 100 mg daily by mouth.      esomeprazole (NEXIUM 24HR) 20 MG capsule Take 1 capsule (20 mg total) by mouth daily at 12 noon. 90 capsule 1   fluticasone (FLONASE) 50 MCG/ACT nasal spray SHAKE LIQUID AND USE 1 SPRAY IN EACH NOSTRIL TWICE DAILY 48 g 1   gabapentin (NEURONTIN) 300 MG capsule Take 1 capsule (300 mg total) by mouth 3 (three) times daily. For pain. 962 capsule 0   Garlic 836 MG CAPS Take 500 mg 2 (two) times daily by mouth.     lisinopril (ZESTRIL) 20 MG tablet Take 1 tablet (20 mg total) by mouth daily. For blood pressure. 90 tablet 3   metFORMIN (GLUCOPHAGE) 500 MG tablet TAKE 1 TABLET BY MOUTH TWICE DAILY for diabetes. 180 tablet 3   mupirocin cream (BACTROBAN) 2 % Apply 1 application  topically 2 (two) times daily. 15 g 0   oxybutynin (OXYTROL) 3.9 MG/24HR Place 1 patch onto the skin 5 days.      senna-docusate (SENOKOT-S) 8.6-50 MG tablet Take 2 tablets by mouth 2 (two) times daily. (Patient taking differently: Take 1-2 tablets by mouth daily as needed for mild constipation.)     No current facility-administered medications on file prior to visit.    Pulse 87   Temp 97.6 F (36.4 C) (Temporal)   Ht 5\' 7"  (1.702 m)   Wt 176 lb (79.8 kg)   SpO2 97%   BMI 27.57 kg/m  Objective:   Physical Exam Cardiovascular:     Rate and Rhythm: Normal rate and regular rhythm.  Pulmonary:     Effort: Pulmonary effort is normal.     Breath sounds: Normal breath sounds.  Musculoskeletal:     Cervical back: Neck supple.  Skin:    General: Skin is warm and dry.          Assessment & Plan:      This visit occurred during the SARS-CoV-2 public health emergency.  Safety protocols were in place, including screening questions prior to the visit, additional usage of staff PPE, and extensive cleaning of exam room while observing appropriate contact time as indicated for disinfecting solutions.

## 2021-07-01 ENCOUNTER — Ambulatory Visit (INDEPENDENT_AMBULATORY_CARE_PROVIDER_SITE_OTHER)
Admission: RE | Admit: 2021-07-01 | Discharge: 2021-07-01 | Disposition: A | Discharge status: Home / Self Care | Payer: PPO | Source: Ambulatory Visit | Attending: Acute Care | Admitting: Acute Care

## 2021-07-01 ENCOUNTER — Other Ambulatory Visit: Payer: Self-pay

## 2021-07-01 DIAGNOSIS — R911 Solitary pulmonary nodule: Secondary | ICD-10-CM | POA: Diagnosis not present

## 2021-07-01 DIAGNOSIS — Z87891 Personal history of nicotine dependence: Secondary | ICD-10-CM

## 2021-07-01 DIAGNOSIS — I700 Atherosclerosis of aorta: Secondary | ICD-10-CM | POA: Diagnosis not present

## 2021-07-09 ENCOUNTER — Other Ambulatory Visit: Payer: Self-pay | Admitting: Acute Care

## 2021-07-09 DIAGNOSIS — R911 Solitary pulmonary nodule: Secondary | ICD-10-CM

## 2021-07-09 NOTE — Progress Notes (Signed)
I have called the patient with the results of her scan. I explained that her scan was read as a Lung RADS 4 B indicates suspicious findings for which additional diagnostic testing and or tissue sampling is recommended. I explained that I have reviewed her scan with Dr. Lamonte Sakai, and we will be getting a Super D PET and PFT's to better evaluate the area of concern and her lung function. She is in agreement with this plan. I told her the orders are being placed today, and that she can expect phone calls to get these scheduled.  Megan [name], please fax results to PCP and let her know we have a plan for further imaging. Thanks so much.

## 2021-07-16 ENCOUNTER — Other Ambulatory Visit: Payer: Self-pay | Admitting: Primary Care

## 2021-07-16 DIAGNOSIS — J3089 Other allergic rhinitis: Secondary | ICD-10-CM

## 2021-07-20 ENCOUNTER — Encounter (HOSPITAL_COMMUNITY)
Admission: RE | Admit: 2021-07-20 | Discharge: 2021-07-20 | Disposition: A | Discharge status: Home / Self Care | Payer: PPO | Source: Ambulatory Visit | Attending: Acute Care | Admitting: Acute Care

## 2021-07-20 DIAGNOSIS — I700 Atherosclerosis of aorta: Secondary | ICD-10-CM | POA: Diagnosis not present

## 2021-07-20 DIAGNOSIS — R911 Solitary pulmonary nodule: Secondary | ICD-10-CM

## 2021-07-20 DIAGNOSIS — J439 Emphysema, unspecified: Secondary | ICD-10-CM | POA: Diagnosis not present

## 2021-07-20 LAB — GLUCOSE, CAPILLARY: Glucose-Capillary: 167 mg/dL — ABNORMAL HIGH (ref 70–99)

## 2021-07-20 MED ORDER — FLUDEOXYGLUCOSE F - 18 (FDG) INJECTION
8.7500 | Freq: Once | INTRAVENOUS | Status: AC
  Administered 2021-07-20: 8.75 via INTRAVENOUS

## 2021-07-29 ENCOUNTER — Telehealth: Payer: Self-pay | Admitting: Acute Care

## 2021-07-29 NOTE — Telephone Encounter (Signed)
I have called the patient with the results of her super D CT and PET scan.  I explained that the super D CT showed no substantial interval change in the medial left lower lobe pulmonary lesion that was suspicious on the original low-dose CT. I also explained that the nodule in that left lower lobe lesion showed low-level FDG uptake on the PET scan.  I explained that this could indicate a slow-growing neoplasm and that close follow-up would be needed. Plan is to get patient on either Dr. Lamonte Sakai, or Dr. Juline Patch schedule preferably in an afternoon appointment as patient's husband works and she is reliant on him for transportation. Langley Gauss, please schedule patient with either Dr. Lamonte Sakai or Dr. Valeta Harms to review super D and PET scan results and to develop plan of care regarding timing of follow-up versus intervention now. Thanks so much

## 2021-07-31 ENCOUNTER — Ambulatory Visit (INDEPENDENT_AMBULATORY_CARE_PROVIDER_SITE_OTHER): Payer: PPO | Admitting: Primary Care

## 2021-07-31 ENCOUNTER — Other Ambulatory Visit: Payer: Self-pay

## 2021-07-31 ENCOUNTER — Encounter: Payer: Self-pay | Admitting: Primary Care

## 2021-07-31 VITALS — BP 134/76 | HR 78 | Temp 97.0°F | Ht 67.0 in | Wt 176.0 lb

## 2021-07-31 DIAGNOSIS — E1142 Type 2 diabetes mellitus with diabetic polyneuropathy: Secondary | ICD-10-CM

## 2021-07-31 DIAGNOSIS — G894 Chronic pain syndrome: Secondary | ICD-10-CM | POA: Diagnosis not present

## 2021-07-31 DIAGNOSIS — M4714 Other spondylosis with myelopathy, thoracic region: Secondary | ICD-10-CM | POA: Diagnosis not present

## 2021-07-31 DIAGNOSIS — M792 Neuralgia and neuritis, unspecified: Secondary | ICD-10-CM

## 2021-07-31 DIAGNOSIS — M4727 Other spondylosis with radiculopathy, lumbosacral region: Secondary | ICD-10-CM | POA: Diagnosis not present

## 2021-07-31 DIAGNOSIS — Z0000 Encounter for general adult medical examination without abnormal findings: Secondary | ICD-10-CM

## 2021-07-31 DIAGNOSIS — Z23 Encounter for immunization: Secondary | ICD-10-CM

## 2021-07-31 DIAGNOSIS — I10 Essential (primary) hypertension: Secondary | ICD-10-CM

## 2021-07-31 DIAGNOSIS — E1165 Type 2 diabetes mellitus with hyperglycemia: Secondary | ICD-10-CM

## 2021-07-31 DIAGNOSIS — M62838 Other muscle spasm: Secondary | ICD-10-CM | POA: Diagnosis not present

## 2021-07-31 DIAGNOSIS — E785 Hyperlipidemia, unspecified: Secondary | ICD-10-CM | POA: Diagnosis not present

## 2021-07-31 DIAGNOSIS — Z1211 Encounter for screening for malignant neoplasm of colon: Secondary | ICD-10-CM

## 2021-07-31 DIAGNOSIS — N3281 Overactive bladder: Secondary | ICD-10-CM | POA: Diagnosis not present

## 2021-07-31 MED ORDER — GABAPENTIN 300 MG PO CAPS: 300.0000 mg | ORAL_CAPSULE | Freq: Two times a day (BID) | ORAL | 3 refills | Status: DC

## 2021-07-31 MED ORDER — CELECOXIB 200 MG PO CAPS: ORAL_CAPSULE | ORAL | 0 refills | Status: DC

## 2021-07-31 MED ORDER — ATORVASTATIN CALCIUM 20 MG PO TABS: ORAL_TABLET | ORAL | 3 refills | Status: DC

## 2021-07-31 NOTE — Assessment & Plan Note (Signed)
Stable.  We will have her reduce her Celebrex to 200 mg once daily, she agrees.  Continue gabapentin 300 mg twice daily and baclofen 10 mg for which she actually takes twice daily, not 3 times daily.

## 2021-07-31 NOTE — Assessment & Plan Note (Signed)
Doing well on gabapentin 300 mg for which she takes twice daily.  Refill provided.

## 2021-07-31 NOTE — Progress Notes (Signed)
Subjective:    Patient ID: Megan [name], female    DOB: 1951-04-04, 70 y.o.   MRN: [id number]  HPI  Megan [name] is a very pleasant 70 y.o. female who presents today for complete physical and follow up of chronic conditions.  Immunizations: -Tetanus: 2016 -Influenza: Due today -Covid-19: 2 vaccines  -Shingles: Shingrix  -Pneumonia: Pneumovax in 2021 and 2016   Diet: [place].  Exercise: No regular exercise.  Eye exam: Completes annually  Dental exam: No recent exam. Dentures    Mammogram: Completed in April 2022 Dexa: Completed in April 2021 Colonoscopy: Never completed, declines. Opts for Cologuard  Lung Cancer Screening: Completed in October 2022, suspicious for carcinoma. Has been referred to pulmonology.   BP Readings from Last 3 Encounters:  07/31/21 134/76  06/23/21 (!) 148/78  05/07/21 (!) 142/78      Review of Systems  Constitutional:  Negative for unexpected weight change.  HENT:  Negative for rhinorrhea.   Respiratory:  Negative for cough.        Dyspnea with heavy exertion  Cardiovascular:  Negative for chest pain.  Gastrointestinal:  Negative for constipation and diarrhea.  Genitourinary:  Negative for difficulty urinating.       Urinary incontinence improved  Musculoskeletal:  Positive for arthralgias and myalgias.  Skin:  Negative for rash.  Allergic/Immunologic: Negative for environmental allergies.  Neurological:  Negative for dizziness and headaches.  Psychiatric/Behavioral:  The patient is not nervous/anxious.         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   Constipation due to pain medication    Debility    Dyspnea    W/ PHYS CONDITION    Foul smelling urine 07/22/2020   GERD (gastroesophageal reflux disease)    History of bronchitis    "not  since I quit smoking" (07/26/2017)   History of kidney stones    History of shingles    Hyperlipidemia    takes Fish Oil daily   Hypertension    Hypoalbuminemia due to protein-calorie malnutrition ([place])    L3 vertebral fracture ([place]) 09/01/2017   Myelopathy ([place]) 12/21/2016   Neuromuscular disorder ([facility])    tingling toes   Paraparesis ([place]) 12/15/2016   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   Pneumonia 2009   PONV (postoperative nausea and vomiting)    Radiculopathy 07/28/2017   Restless leg    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   Type 2 diabetes mellitus ([place])    Uterine cancer ([place]) 1979   S/P hysterectomy   Weakness    numbness and tingling in both feet r/t back    Social History   Socioeconomic History   Marital status: Married    Spouse name: [name]   Number of children: 1   Years of education: 12   Highest education level: Not on file  Occupational History   Not on file  Tobacco Use   Smoking status: Former    Packs/day: 1.50    Years: 53.00    Pack years: 79.50    Types: Cigarettes    Quit date: 05/13/2006    Years since quitting: 15.2   Smokeless tobacco: Never  Vaping Use   Vaping Use: Never used  Substance and Sexual Activity   Alcohol use:  Yes    Comment: 07/26/2017 "glass of wine once/wk if not on RX"   Drug use: No   Sexual activity: Not Currently    Birth control/protection: Surgical  Other Topics Concern   Not on file  Social History Narrative   Lives w/ husband   Married.   1 child, 1 grandchildren.   Retired. Once worked for [employer].   Enjoys reading.    Social Determinants of Health   Financial Resource Strain: Low Risk    Difficulty of Paying Living Expenses: Not hard at all  Food Insecurity: No Food Insecurity   Worried About Charity fundraiser in the Last Year: Never true   [place] in the Last Year: Never true  Transportation Needs: No  Transportation Needs   Lack of Transportation (Medical): No   Lack of Transportation (Non-Medical): No  Physical Activity: Inactive   Days of Exercise per Week: 0 days   Minutes of Exercise per Session: 0 min  Stress: No Stress Concern Present   Feeling of Stress : Not at all  Social Connections: Not on file  Intimate Partner Violence: Not At Risk   Fear of Current or Ex-Partner: No   Emotionally Abused: No   Physically Abused: No   Sexually Abused: No    Past Surgical History:  Procedure Laterality Date   ABDOMINAL EXPOSURE N/A 07/26/2017   Procedure: ABDOMINAL EXPOSURE;  Surgeon: Angelia Mould, MD;  Location: [facility];  Service: Vascular;  Laterality: N/A;   ANTERIOR CERVICAL DECOMP/DISCECTOMY FUSION  2001   ANTERIOR LUMBAR FUSION N/A 07/26/2017   Procedure: Lumbar five-Sacral one Anterior lumbar interbody fusion with Dr. Deitra Mayo for approach;  Surgeon: Ditty, Kevan Ny, MD;  Location: [facility];  Service: Neurosurgery;  Laterality: N/A;   APPLICATION OF ROBOTIC ASSISTANCE FOR SPINAL PROCEDURE  12/19/2016   Procedure: APPLICATION OF ROBOTIC ASSISTANCE FOR SPINAL PROCEDURE;  Surgeon: Kevan Ny Ditty, MD;  Location: [facility];  Service: Neurosurgery;;   APPLICATION OF ROBOTIC ASSISTANCE FOR SPINAL PROCEDURE N/A 07/26/2017   Procedure: APPLICATION OF ROBOTIC ASSISTANCE FOR SPINAL PROCEDURE;  Surgeon: Ditty, Kevan Ny, MD;  Location: [facility];  Service: Neurosurgery;  Laterality: N/A;   APPLICATION OF ROBOTIC ASSISTANCE FOR SPINAL PROCEDURE N/A 09/02/2017   Procedure: APPLICATION OF ROBOTIC ASSISTANCE FOR SPINAL PROCEDURE;  Surgeon: Ditty, Kevan Ny, MD;  Location: [facility];  Service: Neurosurgery;  Laterality: N/A;   BACK SURGERY     BLADDER SUSPENSION  1991   tack   INGUINAL HERNIA REPAIR Right 1991   LUMBAR FUSION  2016; 2017; 07/26/2017   L4-5; L2-3; L5-S1   POSTERIOR LUMBAR FUSION 4 WITH HARDWARE REMOVAL N/A 09/02/2017   Procedure: Lumbar three-four redo  laminectomy; Repositioning of Left Sacral two screw; Extension of lumbar fusion to Thoracic twelve;  Surgeon: Ditty, Kevan Ny, MD;  Location: [facility];  Service: Neurosurgery;  Laterality: N/A;   TONSILLECTOMY AND ADENOIDECTOMY  1959   TUMOR EXCISION     WERTHIN'S TUMORS BOTH SIDES OF NECK   VAGINAL HYSTERECTOMY  1979    Family History  Problem Relation Age of Onset   Diabetes Mother    Dementia Mother    Cirrhosis Mother        Non alcoholic   COPD Father    Diabetes Brother     Allergies  Allergen Reactions   Shellfish Allergy Anaphylaxis, Swelling and Other (See Comments)    Tongue swells   Oxycodone Nausea Only   Penicillins Rash and  Other (See Comments)    Has patient had a PCN reaction causing immediate rash, facial/tongue/throat swelling, SOB or lightheadedness with hypotension: Yes Has patient had a PCN reaction causing severe rash involving mucus membranes or skin necrosis: No Has patient had a PCN reaction that required hospitalization No Has patient had a PCN reaction occurring within the last 10 years: No If all of the above answers are "NO", then may proceed with Cephalosporin use.    Current Outpatient Medications on File Prior to Visit  Medication Sig Dispense Refill   baclofen (LIORESAL) 10 MG tablet TAKE 1 TABLET(10 MG) BY MOUTH THREE TIMES DAILY AS NEEDED FOR MUSCLE SPASMS 180 tablet 3   Biotin 5000 MCG CAPS Take 5,000 mcg by mouth every morning.      cetirizine (ZYRTEC) 10 MG tablet Take 1 tablet (10 mg total) by mouth daily. For allergies 90 tablet 0   cholecalciferol (VITAMIN D) 1000 units tablet Take 2 tablets (2,000 Units total) by mouth daily after lunch. (Patient taking differently: Take 1,000 Units by mouth daily after lunch.) 30 tablet 0   Coenzyme Q10 (COQ10) 100 MG CAPS Take 100 mg daily by mouth.      esomeprazole (NEXIUM 24HR) 20 MG capsule Take 1 capsule (20 mg total) by mouth daily at 12 noon. 90 capsule 1   fluticasone (FLONASE) 50 MCG/ACT  nasal spray SHAKE LIQUID AND USE 1 SPRAY IN EACH NOSTRIL TWICE DAILY 48 g 1   Garlic 643 MG CAPS Take 500 mg 2 (two) times daily by mouth.     lisinopril (ZESTRIL) 20 MG tablet Take 1 tablet (20 mg total) by mouth daily. For blood pressure. 90 tablet 3   metFORMIN (GLUCOPHAGE) 500 MG tablet TAKE 1 TABLET BY MOUTH TWICE DAILY for diabetes. 180 tablet 3   mupirocin cream (BACTROBAN) 2 % Apply 1 application topically 2 (two) times daily. 15 g 0   oxybutynin (DITROPAN-XL) 5 MG 24 hr tablet Take 1 tablet (5 mg total) by mouth at bedtime. For overactive bladder 90 tablet 0   senna-docusate (SENOKOT-S) 8.6-50 MG tablet Take 2 tablets by mouth 2 (two) times daily. (Patient taking differently: Take 1-2 tablets by mouth daily as needed for mild constipation.)     No current facility-administered medications on file prior to visit.    BP 134/76   Pulse 78   Temp (!) 97 F (36.1 C) (Temporal)   Ht 5\' 7"  (1.702 m)   Wt 176 lb (79.8 kg)   SpO2 99%   BMI 27.57 kg/m  Objective:   Physical Exam HENT:     Right Ear: External ear normal.     Left Ear: External ear normal.     Nose: Nose normal.  Eyes:     Conjunctiva/sclera: Conjunctivae normal.     Pupils: Pupils are equal, round, and reactive to light.  Neck:     Thyroid: No thyromegaly.  Cardiovascular:     Rate and Rhythm: Normal rate and regular rhythm.     Heart sounds: No murmur heard. Pulmonary:     Effort: Pulmonary effort is normal.     Breath sounds: No wheezing or rales.  Abdominal:     General: Bowel sounds are normal.     Palpations: Abdomen is soft.     Tenderness: There is no abdominal tenderness.  Musculoskeletal:     Cervical back: Neck supple.     Comments: Strength equal to bilateral upper and lower extremities.  Ambulates well with walker.  Lymphadenopathy:  Cervical: No cervical adenopathy.  Skin:    General: Skin is warm and dry.     Findings: No rash.  Neurological:     Mental Status: She is alert and  oriented to person, place, and time.     Cranial Nerves: No cranial nerve deficit.     Deep Tendon Reflexes: Reflexes are normal and symmetric.  Psychiatric:        Mood and Affect: Mood normal.          Assessment & Plan:      This visit occurred during the SARS-CoV-2 public health emergency.  Safety protocols were in place, including screening questions prior to the visit, additional usage of staff PPE, and extensive cleaning of exam room while observing appropriate contact time as indicated for disinfecting solutions.

## 2021-07-31 NOTE — Assessment & Plan Note (Signed)
Compliant to metformin 500 mg twice daily, continue same. Repeat A1c pending.  Managed on ACE and statin. Pneumonia vaccine provided today Foot exam up-to-date. Eye exam up-to-date.  Follow-up in 6 months

## 2021-07-31 NOTE — Assessment & Plan Note (Signed)
Significant improvement on oxybutynin XL 5 mg, continue same.

## 2021-07-31 NOTE — Assessment & Plan Note (Signed)
Prevnar 20 provided today, other vaccines up-to-date. Mammogram and bone density scans are up-to-date Declines colonoscopy, does opt for Cologuard, order placed. Lung cancer screening up-to-date, is under evaluation for potential carcinoma.  Exam today stable. Labs pending.

## 2021-07-31 NOTE — Assessment & Plan Note (Signed)
Doing well on baclofen 10 mg which she takes twice daily rather than 3 times daily.  Continue baclofen 10 mg twice daily.

## 2021-07-31 NOTE — Assessment & Plan Note (Signed)
Doing well on regimen of Celebrex 200 mg for which she will reduce to once daily, gabapentin 300 mg twice daily, baclofen 10 mg twice daily.  Continue same

## 2021-07-31 NOTE — Assessment & Plan Note (Signed)
Controlled in the office today. Continue lisinopril 20 mg daily. CMP pending.

## 2021-07-31 NOTE — Assessment & Plan Note (Signed)
Compliant to atorvastatin 20 mg, continue same.  Given aortic atherosclerosis LDL goal should be below 70.

## 2021-07-31 NOTE — Assessment & Plan Note (Deleted)
Compliant to metformin 500 mg twice daily, continue same. Repeat A1c pending.  Managed on ACE and statin. Pneumonia vaccine provided today Foot exam up-to-date. Eye exam up-to-date.  Follow-up in 6 months

## 2021-07-31 NOTE — Assessment & Plan Note (Signed)
Doing well on gabapentin 300 mg for which she takes twice daily rather than 3 times daily.  Refill sent to pharmacy for 300 mg twice daily.

## 2021-07-31 NOTE — Patient Instructions (Addendum)
Send in the Cologuard Kit once complete.  Reduce your Celebrex to one capsule once daily for pain. Let me know how you do with this.  Stop by the lab prior to leaving today. I will notify you of your results once received.   Schedule a visit in 6 months for diabetes check.  It was a pleasure to see you today!

## 2021-08-01 ENCOUNTER — Other Ambulatory Visit: Payer: Self-pay | Admitting: Primary Care

## 2021-08-01 DIAGNOSIS — I10 Essential (primary) hypertension: Secondary | ICD-10-CM

## 2021-08-01 DIAGNOSIS — E785 Hyperlipidemia, unspecified: Secondary | ICD-10-CM

## 2021-08-01 LAB — COMPREHENSIVE METABOLIC PANEL
AG Ratio: 1.4 (calc) (ref 1.0–2.5)
ALT: 24 U/L (ref 6–29)
AST: 18 U/L (ref 10–35)
Albumin: 4.1 g/dL (ref 3.6–5.1)
Alkaline phosphatase (APISO): 87 U/L (ref 37–153)
BUN: 13 mg/dL (ref 7–25)
CO2: 27 mmol/L (ref 20–32)
Calcium: 9.6 mg/dL (ref 8.6–10.4)
Chloride: 105 mmol/L (ref 98–110)
Creat: 0.84 mg/dL (ref 0.50–1.05)
Globulin: 2.9 g/dL (calc) (ref 1.9–3.7)
Glucose, Bld: 103 mg/dL — ABNORMAL HIGH (ref 65–99)
Potassium: 5.4 mmol/L — ABNORMAL HIGH (ref 3.5–5.3)
Sodium: 140 mmol/L (ref 135–146)
Total Bilirubin: 0.5 mg/dL (ref 0.2–1.2)
Total Protein: 7 g/dL (ref 6.1–8.1)

## 2021-08-01 LAB — CBC
HCT: 38.9 % (ref 35.0–45.0)
Hemoglobin: 13 g/dL (ref 11.7–15.5)
MCH: 29.3 pg (ref 27.0–33.0)
MCHC: 33.4 g/dL (ref 32.0–36.0)
MCV: 87.6 fL (ref 80.0–100.0)
MPV: 11.6 fL (ref 7.5–12.5)
Platelets: 208 10*3/uL (ref 140–400)
RBC: 4.44 10*6/uL (ref 3.80–5.10)
RDW: 13.1 % (ref 11.0–15.0)
WBC: 5.5 10*3/uL (ref 3.8–10.8)

## 2021-08-01 LAB — LIPID PANEL
Cholesterol: 146 mg/dL (ref <200)
HDL: 42 mg/dL — ABNORMAL LOW (ref >=50)
LDL Cholesterol (Calc): 77 mg/dL (calc)
Non-HDL Cholesterol (Calc): 104 mg/dL (calc) (ref <130)
Total CHOL/HDL Ratio: 3.5 (calc) (ref <5.0)
Triglycerides: 175 mg/dL — ABNORMAL HIGH (ref <150)

## 2021-08-01 LAB — HEMOGLOBIN A1C
Hgb A1c MFr Bld: 7.2 % of total Hgb — ABNORMAL HIGH (ref <5.7)
Mean Plasma Glucose: 160 mg/dL
eAG (mmol/L): 8.9 mmol/L

## 2021-08-02 ENCOUNTER — Other Ambulatory Visit: Payer: Self-pay | Admitting: Primary Care

## 2021-08-02 DIAGNOSIS — G894 Chronic pain syndrome: Secondary | ICD-10-CM

## 2021-08-02 DIAGNOSIS — M4727 Other spondylosis with radiculopathy, lumbosacral region: Secondary | ICD-10-CM

## 2021-08-02 DIAGNOSIS — E875 Hyperkalemia: Secondary | ICD-10-CM

## 2021-08-03 ENCOUNTER — Telehealth: Payer: Self-pay | Admitting: Primary Care

## 2021-08-03 DIAGNOSIS — Z23 Encounter for immunization: Secondary | ICD-10-CM | POA: Diagnosis not present

## 2021-08-03 DIAGNOSIS — E785 Hyperlipidemia, unspecified: Secondary | ICD-10-CM

## 2021-08-03 NOTE — Addendum Note (Signed)
Addended by: Francella Solian on: 08/03/2021 01:11 PM   Modules accepted: Orders

## 2021-08-03 NOTE — Telephone Encounter (Signed)
Pt called in stating that she is agreeing to increasing her medication-atorvastatin from 20 mg to 40 mg

## 2021-08-04 ENCOUNTER — Other Ambulatory Visit (INDEPENDENT_AMBULATORY_CARE_PROVIDER_SITE_OTHER): Payer: PPO

## 2021-08-04 ENCOUNTER — Other Ambulatory Visit: Payer: Self-pay

## 2021-08-04 DIAGNOSIS — E875 Hyperkalemia: Secondary | ICD-10-CM | POA: Diagnosis not present

## 2021-08-04 MED ORDER — ATORVASTATIN CALCIUM 40 MG PO TABS: 40.0000 mg | ORAL_TABLET | Freq: Every day | ORAL | 3 refills | Status: DC

## 2021-08-04 NOTE — Telephone Encounter (Signed)
Rx for atorvastatin 40 mg sent to pharmacy.  Patient needs repeat lipids in 2 months. Lab only appt.

## 2021-08-05 LAB — POTASSIUM: Potassium: 4.8 mEq/L (ref 3.5–5.1)

## 2021-08-05 NOTE — Telephone Encounter (Signed)
Called patient let know and cancelled the lab appointment. Gave results from labs sent to her my chart as well. No further action needed at this time.

## 2021-08-05 NOTE — Telephone Encounter (Signed)
Patient does not need a potassium checked next week, she came in for this yesterday. She does need a lipid panel checked in 2 months.

## 2021-08-05 NOTE — Telephone Encounter (Signed)
Called patient per lab results she needed potassium checked next week. I have made both lab appointments. Orders need to be put in for potassium

## 2021-08-11 ENCOUNTER — Other Ambulatory Visit: Payer: PPO

## 2021-08-12 ENCOUNTER — Institutional Professional Consult (permissible substitution): Payer: PPO | Admitting: Pulmonary Disease

## 2021-08-13 ENCOUNTER — Ambulatory Visit: Payer: PPO | Admitting: Pulmonary Disease

## 2021-08-13 ENCOUNTER — Other Ambulatory Visit: Payer: Self-pay

## 2021-08-13 ENCOUNTER — Encounter: Payer: Self-pay | Admitting: Pulmonary Disease

## 2021-08-13 VITALS — BP 180/88 | HR 77 | Temp 98.2°F | Ht 67.0 in | Wt 176.0 lb

## 2021-08-13 DIAGNOSIS — Z87891 Personal history of nicotine dependence: Secondary | ICD-10-CM

## 2021-08-13 DIAGNOSIS — R918 Other nonspecific abnormal finding of lung field: Secondary | ICD-10-CM | POA: Diagnosis not present

## 2021-08-13 DIAGNOSIS — R911 Solitary pulmonary nodule: Secondary | ICD-10-CM

## 2021-08-13 NOTE — Progress Notes (Signed)
Synopsis: Referred in December 2022 for lung nodule by Pleas Koch, NP  Subjective:   PATIENT ID: Megan [name] GENDER: female DOB: 07-09-1951, MRN: [id number]  Chief Complaint  Patient presents with   Pulmonary Consult    Lung nodule- referred by Eric Form, NP.     This is a 70 year old female, past medical history of reflux, pneumonia, arthritis.Patient had a abnormal lung cancer screening CT.  She has been enrolled in the program for several years follow with Eric Form, NP.  Last lung cancer screening CT was on 07/01/2021 which was read as a lung RADS 4B.  Patient was found to have a left lower lobe 13.5 mm lung nodule concerning for malignancy.  Patient ultimately had a nuclear medicine PET scan that was ordered and completed on 07/20/2021.  The left lower lobe lesion had low-grade/low-level FDG uptake.  Concern for possibility of a low-grade neoplasm due to its low-level FDG uptake.  Patient presents today to discuss next steps regarding abnormal finding on CT scan.  At baseline the patient uses a rolling walker and sometimes a wheelchair for ambulation or mobility.   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   Constipation due to pain medication    Debility    Dyspnea    W/ PHYS CONDITION    Foul smelling urine 07/22/2020   GERD (gastroesophageal reflux disease)    History of bronchitis    "not since I quit smoking" (07/26/2017)   History of kidney stones    History of shingles    Hyperlipidemia    takes Fish Oil daily   Hypertension    Hypoalbuminemia due to protein-calorie malnutrition ([place])    L3 vertebral fracture ([place]) 09/01/2017   Myelopathy ([place]) 12/21/2016   Neuromuscular disorder (HCC)    tingling toes   Paraparesis ([place]) 12/15/2016   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[place])  12/15/2016   Pneumonia 2009   PONV (postoperative nausea and vomiting)    Radiculopathy 07/28/2017   Restless leg    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   Type 2 diabetes mellitus ([place])    Uterine cancer ([place]) 1979   S/P hysterectomy   Weakness    numbness and tingling in both feet r/t back     Family History  Problem Relation Age of Onset   Diabetes Mother    Dementia Mother    Cirrhosis Mother        Non alcoholic   COPD Father    Diabetes Brother      Past Surgical History:  Procedure Laterality Date   ABDOMINAL EXPOSURE N/A 07/26/2017   Procedure: ABDOMINAL EXPOSURE;  Surgeon: Angelia Mould, MD;  Location: [facility];  Service: Vascular;  Laterality: N/A;   ANTERIOR CERVICAL DECOMP/DISCECTOMY FUSION  2001   ANTERIOR LUMBAR FUSION N/A 07/26/2017   Procedure: Lumbar five-Sacral one Anterior lumbar interbody fusion with Dr. Deitra Mayo for approach;  Surgeon: Ditty, Kevan Ny, MD;  Location: [facility];  Service: Neurosurgery;  Laterality: N/A;   APPLICATION OF ROBOTIC ASSISTANCE FOR SPINAL PROCEDURE  12/19/2016   Procedure: APPLICATION OF ROBOTIC ASSISTANCE FOR SPINAL PROCEDURE;  Surgeon: Kevan Ny Ditty, MD;  Location: [facility];  Service: Neurosurgery;;   APPLICATION OF ROBOTIC ASSISTANCE FOR SPINAL PROCEDURE N/A 07/26/2017  Procedure: APPLICATION OF ROBOTIC ASSISTANCE FOR SPINAL PROCEDURE;  Surgeon: Ditty, Kevan Ny, MD;  Location: [facility];  Service: Neurosurgery;  Laterality: N/A;   APPLICATION OF ROBOTIC ASSISTANCE FOR SPINAL PROCEDURE N/A 09/02/2017   Procedure: APPLICATION OF ROBOTIC ASSISTANCE FOR SPINAL PROCEDURE;  Surgeon: Ditty, Kevan Ny, MD;  Location: [facility];  Service: Neurosurgery;  Laterality: N/A;   BACK SURGERY     BLADDER SUSPENSION  1991   tack   INGUINAL HERNIA REPAIR Right 1991   LUMBAR FUSION  2016; 2017; 07/26/2017   L4-5; L2-3; L5-S1   POSTERIOR LUMBAR FUSION 4  WITH HARDWARE REMOVAL N/A 09/02/2017   Procedure: Lumbar three-four redo laminectomy; Repositioning of Left Sacral two screw; Extension of lumbar fusion to Thoracic twelve;  Surgeon: Ditty, Kevan Ny, MD;  Location: [facility];  Service: Neurosurgery;  Laterality: N/A;   TONSILLECTOMY AND ADENOIDECTOMY  1959   TUMOR EXCISION     WERTHIN'S TUMORS BOTH SIDES OF NECK   VAGINAL HYSTERECTOMY  1979    Social History   Socioeconomic History   Marital status: Married    Spouse name: [name]   Number of children: 1   Years of education: 12   Highest education level: Not on file  Occupational History   Not on file  Tobacco Use   Smoking status: Former    Packs/day: 1.50    Years: 35.00    Pack years: 52.50    Types: Cigarettes    Quit date: 05/13/2006    Years since quitting: 15.2   Smokeless tobacco: Never  Vaping Use   Vaping Use: Never used  Substance and Sexual Activity   Alcohol use: Yes    Comment: 07/26/2017 "glass of wine once/wk if not on RX"   Drug use: No   Sexual activity: Not Currently    Birth control/protection: Surgical  Other Topics Concern   Not on file  Social History Narrative   Lives w/ husband   Married.   1 child, 1 grandchildren.   Retired. Once worked for [employer].   Enjoys reading.    Social Determinants of Health   Financial Resource Strain: Low Risk    Difficulty of Paying Living Expenses: Not hard at all  Food Insecurity: No Food Insecurity   Worried About Charity fundraiser in the Last Year: Never true   [place] in the Last Year: Never true  Transportation Needs: No Transportation Needs   Lack of Transportation (Medical): No   Lack of Transportation (Non-Medical): No  Physical Activity: Inactive   Days of Exercise per Week: 0 days   Minutes of Exercise per Session: 0 min  Stress: No Stress Concern Present   Feeling of Stress : Not at all  Social Connections: Not on file  Intimate Partner Violence: Not At Risk   Fear of Current  or Ex-Partner: No   Emotionally Abused: No   Physically Abused: No   Sexually Abused: No     Allergies  Allergen Reactions   Shellfish Allergy Anaphylaxis, Swelling and Other (See Comments)    Tongue swells   Oxycodone Nausea Only   Penicillins Rash and Other (See Comments)    Has patient had a PCN reaction causing immediate rash, facial/tongue/throat swelling, SOB or lightheadedness with hypotension: Yes Has patient had a PCN reaction causing severe rash involving mucus membranes or skin necrosis: No Has patient had a PCN reaction that required hospitalization No Has patient had a PCN reaction occurring within the last 10 years:  No If all of the above answers are "NO", then may proceed with Cephalosporin use.     Outpatient Medications Prior to Visit  Medication Sig Dispense Refill   atorvastatin (LIPITOR) 40 MG tablet Take 1 tablet (40 mg total) by mouth daily. For cholesterol. 90 tablet 3   baclofen (LIORESAL) 10 MG tablet TAKE 1 TABLET(10 MG) BY MOUTH THREE TIMES DAILY AS NEEDED FOR MUSCLE SPASMS 180 tablet 3   Biotin 5000 MCG CAPS Take 5,000 mcg by mouth every morning.      celecoxib (CELEBREX) 200 MG capsule TAKE 1 CAPSULE(200 MG) BY MOUTH ONCE TO TWICE DAILY AS NEEDED FOR MODERATE PAIN 180 capsule 0   cetirizine (ZYRTEC) 10 MG tablet Take 1 tablet (10 mg total) by mouth daily. For allergies 90 tablet 0   cholecalciferol (VITAMIN D) 1000 units tablet Take 2 tablets (2,000 Units total) by mouth daily after lunch. (Patient taking differently: Take 1,000 Units by mouth daily after lunch.) 30 tablet 0   Coenzyme Q10 (COQ10) 100 MG CAPS Take 100 mg daily by mouth.      esomeprazole (NEXIUM 24HR) 20 MG capsule Take 1 capsule (20 mg total) by mouth daily at 12 noon. 90 capsule 1   fluticasone (FLONASE) 50 MCG/ACT nasal spray SHAKE LIQUID AND USE 1 SPRAY IN EACH NOSTRIL TWICE DAILY 48 g 1   gabapentin (NEURONTIN) 300 MG capsule Take 1 capsule (300 mg total) by mouth 2 (two) times daily.  For pain. 709 capsule 3   Garlic 628 MG CAPS Take 500 mg 2 (two) times daily by mouth.     lisinopril (ZESTRIL) 20 MG tablet Take 1 tablet (20 mg total) by mouth daily. For blood pressure. 90 tablet 3   metFORMIN (GLUCOPHAGE) 500 MG tablet TAKE 1 TABLET BY MOUTH TWICE DAILY for diabetes. 180 tablet 3   mupirocin cream (BACTROBAN) 2 % Apply 1 application topically 2 (two) times daily. 15 g 0   oxybutynin (DITROPAN-XL) 5 MG 24 hr tablet Take 1 tablet (5 mg total) by mouth at bedtime. For overactive bladder 90 tablet 0   senna-docusate (SENOKOT-S) 8.6-50 MG tablet Take 2 tablets by mouth 2 (two) times daily. (Patient taking differently: Take 1-2 tablets by mouth daily as needed for mild constipation.)     No facility-administered medications prior to visit.    Review of Systems  Constitutional:  Negative for chills, fever, malaise/fatigue and weight loss.  HENT:  Negative for hearing loss, sore throat and tinnitus.   Eyes:  Negative for blurred vision and double vision.  Respiratory:  Negative for cough, hemoptysis, sputum production, shortness of breath, wheezing and stridor.   Cardiovascular:  Negative for chest pain, palpitations, orthopnea, leg swelling and PND.  Gastrointestinal:  Negative for abdominal pain, constipation, diarrhea, heartburn, nausea and vomiting.  Genitourinary:  Negative for dysuria, hematuria and urgency.  Musculoskeletal:  Negative for joint pain and myalgias.  Skin:  Negative for itching and rash.  Neurological:  Negative for dizziness, tingling, weakness and headaches.  Endo/Heme/Allergies:  Negative for environmental allergies. Does not bruise/bleed easily.  Psychiatric/Behavioral:  Negative for depression. The patient is not nervous/anxious and does not have insomnia.   All other systems reviewed and are negative.   Objective:  Physical Exam Vitals reviewed.  Constitutional:      General: She is not in acute distress.    Appearance: She is well-developed.   HENT:     Head: Normocephalic and atraumatic.  Eyes:     General: No scleral icterus.  Conjunctiva/sclera: Conjunctivae normal.     Pupils: Pupils are equal, round, and reactive to light.  Neck:     Vascular: No JVD.     Trachea: No tracheal deviation.  Cardiovascular:     Rate and Rhythm: Normal rate and regular rhythm.     Heart sounds: Normal heart sounds. No murmur heard. Pulmonary:     Effort: Pulmonary effort is normal. No tachypnea, accessory muscle usage or respiratory distress.     Breath sounds: No stridor. No wheezing, rhonchi or rales.  Abdominal:     General: There is no distension.     Palpations: Abdomen is soft.     Tenderness: There is no abdominal tenderness.  Musculoskeletal:        General: No tenderness.     Cervical back: Neck supple.     Right lower leg: Edema present.     Left lower leg: Edema present.  Lymphadenopathy:     Cervical: No cervical adenopathy.  Skin:    General: Skin is warm and dry.     Capillary Refill: Capillary refill takes less than 2 seconds.     Findings: No rash.  Neurological:     Mental Status: She is alert and oriented to person, place, and time.  Psychiatric:        Behavior: Behavior normal.     Vitals:   08/13/21 1607  BP: (!) 180/88  Pulse: 77  Temp: 98.2 F (36.8 C)  TempSrc: Oral  SpO2: 96%  Weight: 176 lb (79.8 kg)  Height: 5\' 7"  (1.702 m)   96% on RA BMI Readings from Last 3 Encounters:  08/13/21 27.57 kg/m  07/31/21 27.57 kg/m  06/23/21 27.57 kg/m   Wt Readings from Last 3 Encounters:  08/13/21 176 lb (79.8 kg)  07/31/21 176 lb (79.8 kg)  06/23/21 176 lb (79.8 kg)     CBC    Component Value Date/Time   WBC 5.5 07/31/2021 1600   RBC 4.44 07/31/2021 1600   HGB 13.0 07/31/2021 1600   HCT 38.9 07/31/2021 1600   PLT 208 07/31/2021 1600   MCV 87.6 07/31/2021 1600   MCH 29.3 07/31/2021 1600   MCHC 33.4 07/31/2021 1600   RDW 13.1 07/31/2021 1600   LYMPHSABS 0.8 09/08/2017 0935   MONOABS  0.6 09/08/2017 0935   EOSABS 0.2 09/08/2017 0935   BASOSABS 0.0 09/08/2017 0935     Chest Imaging: 07/01/2021 lung cancer screening CT: Abnormal left lower lobe nodule.  This has been present since January 2022.  There has been some slow evolution of the nodule. But has remained persistent. The patient's images have been independently reviewed by me.    Nuclear medicine pet imaging 07/20/2021: Low-level PET uptake. Cannot exclude low-grade malignancy. The patient's images have been independently reviewed by me.    Pulmonary Functions Testing Results: No flowsheet data found.  FeNO:   Pathology:   Echocardiogram:   Heart Catheterization:     Assessment & Plan:     ICD-10-CM   1. Lung nodule seen on imaging study  R91.1 Procedural/ Surgical Case Request: ROBOTIC ASSISTED NAVIGATIONAL BRONCHOSCOPY    Ambulatory referral to Pulmonology    CANCELED: Ambulatory referral to Pulmonology    2. Former smoker  Z87.891     3. Personal history of tobacco use, presenting hazards to health  Z87.891     4. Abnormal CT lung screening  R91.8       Discussion: This is a 70 year old female with a left lower lobe pulmonary  nodule.  It has showed some evolution and change slightly larger in size from its previous documentation in January 2022.  She has had subsequent lung cancer screening lung nodule follow-up CTs.  Ultimately led to a nuclear medicine PET scan which shows low-level uptake.  Plan: We discussed the pros and cons of continued conservative watchful waiting versus consideration for tissue biopsy. We describe what it would take to undergo robotic assisted navigational bronchoscopy. We discussed the risk benefits and alternatives. Patient is agreeable to consider tissue sampling due to the persistence of the nodule and to help delineate what we should do next. Due to her current mobility status I am not sure that she would be suited for surgical resection if we do prove this is  malignant.  We also talked about what that would look like either from a surgical side removal or consideration for radiation treatments if we prove that this is malignancy. I still think the next best step at this point is consideration for tissue biopsy and she agrees. We will plan for tentative bronchoscopy on 09/15/2020 She would like to wait till after the holidays before undergoing the procedure. Since the lesion has been there since January of this year I think waiting 4 weeks is okay.  Return to see [place] in approximately 1 week after bronchoscopy, me or Eric Form, NP.    Current Outpatient Medications:    atorvastatin (LIPITOR) 40 MG tablet, Take 1 tablet (40 mg total) by mouth daily. For cholesterol., Disp: 90 tablet, Rfl: 3   baclofen (LIORESAL) 10 MG tablet, TAKE 1 TABLET(10 MG) BY MOUTH THREE TIMES DAILY AS NEEDED FOR MUSCLE SPASMS, Disp: 180 tablet, Rfl: 3   Biotin 5000 MCG CAPS, Take 5,000 mcg by mouth every morning. , Disp: , Rfl:    celecoxib (CELEBREX) 200 MG capsule, TAKE 1 CAPSULE(200 MG) BY MOUTH ONCE TO TWICE DAILY AS NEEDED FOR MODERATE PAIN, Disp: 180 capsule, Rfl: 0   cetirizine (ZYRTEC) 10 MG tablet, Take 1 tablet (10 mg total) by mouth daily. For allergies, Disp: 90 tablet, Rfl: 0   cholecalciferol (VITAMIN D) 1000 units tablet, Take 2 tablets (2,000 Units total) by mouth daily after lunch. (Patient taking differently: Take 1,000 Units by mouth daily after lunch.), Disp: 30 tablet, Rfl: 0   Coenzyme Q10 (COQ10) 100 MG CAPS, Take 100 mg daily by mouth. , Disp: , Rfl:    esomeprazole (NEXIUM 24HR) 20 MG capsule, Take 1 capsule (20 mg total) by mouth daily at 12 noon., Disp: 90 capsule, Rfl: 1   fluticasone (FLONASE) 50 MCG/ACT nasal spray, SHAKE LIQUID AND USE 1 SPRAY IN EACH NOSTRIL TWICE DAILY, Disp: 48 g, Rfl: 1   gabapentin (NEURONTIN) 300 MG capsule, Take 1 capsule (300 mg total) by mouth 2 (two) times daily. For pain., Disp: 180 capsule, Rfl: 3   Garlic 161 MG CAPS,  Take 500 mg 2 (two) times daily by mouth., Disp: , Rfl:    lisinopril (ZESTRIL) 20 MG tablet, Take 1 tablet (20 mg total) by mouth daily. For blood pressure., Disp: 90 tablet, Rfl: 3   metFORMIN (GLUCOPHAGE) 500 MG tablet, TAKE 1 TABLET BY MOUTH TWICE DAILY for diabetes., Disp: 180 tablet, Rfl: 3   mupirocin cream (BACTROBAN) 2 %, Apply 1 application topically 2 (two) times daily., Disp: 15 g, Rfl: 0   oxybutynin (DITROPAN-XL) 5 MG 24 hr tablet, Take 1 tablet (5 mg total) by mouth at bedtime. For overactive bladder, Disp: 90 tablet, Rfl: 0   senna-docusate (  SENOKOT-S) 8.6-50 MG tablet, Take 2 tablets by mouth 2 (two) times daily. (Patient taking differently: Take 1-2 tablets by mouth daily as needed for mild constipation.), Disp: , Rfl:   I spent 63 minutes dedicated to the care of this patient on the date of this encounter to include pre-visit review of records, face-to-face time with the patient discussing conditions above, post visit ordering of testing, clinical documentation with the electronic health record, making appropriate referrals as documented, and communicating necessary findings to members of the patients care team.   Garner Nash, [facility] 08/13/2021 4:14 PM

## 2021-08-13 NOTE — Patient Instructions (Signed)
Thank you for visiting Dr. Valeta Harms at [facility]. Today we recommend the following:  Orders Placed This Encounter  Procedures   Procedural/ Surgical Case Request: ROBOTIC ASSISTED NAVIGATIONAL BRONCHOSCOPY   Ambulatory referral to Pulmonology   Bronchoscopy to be scheduled on 09/15/2021  Return in about 6 weeks (around 09/21/2021) for with Eric Form, NP, or Dr. Valeta Harms.    Please do your part to reduce the spread of COVID-19.

## 2021-08-14 DIAGNOSIS — R911 Solitary pulmonary nodule: Secondary | ICD-10-CM | POA: Insufficient documentation

## 2021-08-14 HISTORY — DX: Solitary pulmonary nodule: R91.1

## 2021-08-15 DIAGNOSIS — Z1211 Encounter for screening for malignant neoplasm of colon: Secondary | ICD-10-CM | POA: Diagnosis not present

## 2021-08-19 ENCOUNTER — Other Ambulatory Visit: Payer: Self-pay | Admitting: Primary Care

## 2021-08-19 ENCOUNTER — Encounter: Payer: Self-pay | Admitting: Pulmonary Disease

## 2021-08-19 DIAGNOSIS — M62838 Other muscle spasm: Secondary | ICD-10-CM

## 2021-08-20 LAB — COLOGUARD: COLOGUARD: NEGATIVE

## 2021-09-10 ENCOUNTER — Other Ambulatory Visit: Payer: Self-pay

## 2021-09-10 ENCOUNTER — Encounter (HOSPITAL_COMMUNITY): Payer: Self-pay | Admitting: Pulmonary Disease

## 2021-09-10 NOTE — Progress Notes (Signed)
PCP - Alma Friendly, NP Cardiologist - denies  PPM/ICD - denies   Chest x-ray - denies EKG - DOS Stress Test - denies ECHO - denies Cardiac Cath - denies   Fasting Blood Sugar - 110-120 Checks Blood Sugar 1/day  ERAS Protcol - n/a  COVID TEST- DOS  Anesthesia review: Y  Patient verbally denies any shortness of breath, fever, cough and chest pain during phone call   -------------  SDW INSTRUCTIONS given:  Your procedure is scheduled on 09/15/20.  Report to [facility] Main Entrance "A" at 0900 A.M., and check in at the Admitting office.  Call this number if you have problems the morning of surgery:  [phone]   Remember:  Do not eat after midnight the night before your surgery   Take these medicines the morning of surgery with A SIP OF WATER Gabapentin, baclofen, and flonase  As of today, STOP taking any Aspirin (unless otherwise instructed by your surgeon) Aleve, Naproxen, Ibuprofen, Motrin, Advil, Goody's, BC's, all herbal medications, fish oil, and all vitamins.                      Do not wear jewelry, make up, or nail polish            Do not wear lotions, powders, perfumes/colognes, or deodorant.            Do not shave 48 hours prior to surgery.  Men may shave face and neck.            Do not bring valuables to the hospital.            [facility] is not responsible for any belongings or valuables.  Do NOT Smoke (Tobacco/Vaping) or drink Alcohol 24 hours prior to your procedure If you use a CPAP at night, you may bring all equipment for your overnight stay.   Contacts, glasses, dentures or bridgework may not be worn into surgery.      For patients admitted to the hospital, discharge time will be determined by your treatment team.   Patients discharged the day of surgery will not be allowed to drive home, and someone needs to stay with them for 24 hours.  .** PLEASE check your blood sugar the morning of your surgery when you wake up and every 2 hours  until you get to the Short Stay unit.  If your blood sugar is less than 70 mg/dL, you will need to treat for low blood sugar: Do not take insulin. Treat a low blood sugar (less than 70 mg/dL) with  cup of clear juice (cranberry or apple), 4 glucose tablets, OR glucose gel. Recheck blood sugar in 15 minutes after treatment (to make sure it is greater than 70 mg/dL). If your blood sugar is not greater than 70 mg/dL on recheck, call [phone] for further instructions.   Special instructions:   [place]- Preparing For Surgery  Before surgery, you can play an important role. Because skin is not sterile, your skin needs to be as free of germs as possible. You can reduce the number of germs on your skin by washing with CHG (chlorahexidine gluconate) Soap before surgery.  CHG is an antiseptic cleaner which kills germs and bonds with the skin to continue killing germs even after washing.    Oral Hygiene is also important to reduce your risk of infection.  Remember - BRUSH YOUR TEETH THE MORNING OF SURGERY WITH YOUR REGULAR TOOTHPASTE  Please do not use  if you have an allergy to CHG or antibacterial soaps. If your skin becomes reddened/irritated stop using the CHG.  Do not shave (including legs and underarms) for at least 48 hours prior to first CHG shower. It is OK to shave your face.  Please follow these instructions carefully.   Shower the NIGHT BEFORE SURGERY and the MORNING OF SURGERY with DIAL Soap.   Pat yourself dry with a CLEAN TOWEL.  Wear CLEAN PAJAMAS to bed the night before surgery  Place CLEAN SHEETS on your bed the night of your first shower and DO NOT SLEEP WITH PETS.   Day of Surgery: Please shower morning of surgery  Wear Clean/Comfortable clothing the morning of surgery Do not apply any deodorants/lotions.   Remember to brush your teeth WITH YOUR REGULAR TOOTHPASTE.   Questions were answered. Patient verbalized understanding of instructions.

## 2021-09-13 DIAGNOSIS — C3490 Malignant neoplasm of unspecified part of unspecified bronchus or lung: Secondary | ICD-10-CM

## 2021-09-13 HISTORY — DX: Malignant neoplasm of unspecified part of unspecified bronchus or lung: C34.90

## 2021-09-15 ENCOUNTER — Ambulatory Visit (HOSPITAL_COMMUNITY)
Admission: RE | Admit: 2021-09-15 | Discharge: 2021-09-15 | Disposition: A | Discharge status: Home / Self Care | Payer: PPO | Attending: Pulmonary Disease | Admitting: Pulmonary Disease

## 2021-09-15 ENCOUNTER — Ambulatory Visit (HOSPITAL_COMMUNITY): Payer: PPO

## 2021-09-15 ENCOUNTER — Encounter (HOSPITAL_COMMUNITY)
Admission: RE | Disposition: A | Discharge status: Home / Self Care | Payer: Self-pay | Source: Home / Self Care | Attending: Pulmonary Disease

## 2021-09-15 ENCOUNTER — Other Ambulatory Visit: Payer: Self-pay

## 2021-09-15 ENCOUNTER — Ambulatory Visit (HOSPITAL_COMMUNITY): Payer: PPO | Admitting: Physician Assistant

## 2021-09-15 ENCOUNTER — Encounter (HOSPITAL_COMMUNITY): Payer: Self-pay | Admitting: Pulmonary Disease

## 2021-09-15 ENCOUNTER — Other Ambulatory Visit: Payer: Self-pay | Admitting: Primary Care

## 2021-09-15 DIAGNOSIS — Z7984 Long term (current) use of oral hypoglycemic drugs: Secondary | ICD-10-CM | POA: Insufficient documentation

## 2021-09-15 DIAGNOSIS — E119 Type 2 diabetes mellitus without complications: Secondary | ICD-10-CM | POA: Insufficient documentation

## 2021-09-15 DIAGNOSIS — E785 Hyperlipidemia, unspecified: Secondary | ICD-10-CM | POA: Diagnosis not present

## 2021-09-15 DIAGNOSIS — G709 Myoneural disorder, unspecified: Secondary | ICD-10-CM | POA: Diagnosis not present

## 2021-09-15 DIAGNOSIS — D649 Anemia, unspecified: Secondary | ICD-10-CM | POA: Diagnosis not present

## 2021-09-15 DIAGNOSIS — Z87891 Personal history of nicotine dependence: Secondary | ICD-10-CM | POA: Diagnosis not present

## 2021-09-15 DIAGNOSIS — I10 Essential (primary) hypertension: Secondary | ICD-10-CM | POA: Diagnosis not present

## 2021-09-15 DIAGNOSIS — Z20822 Contact with and (suspected) exposure to covid-19: Secondary | ICD-10-CM | POA: Diagnosis not present

## 2021-09-15 DIAGNOSIS — J449 Chronic obstructive pulmonary disease, unspecified: Secondary | ICD-10-CM | POA: Diagnosis not present

## 2021-09-15 DIAGNOSIS — K219 Gastro-esophageal reflux disease without esophagitis: Secondary | ICD-10-CM | POA: Diagnosis not present

## 2021-09-15 DIAGNOSIS — Z419 Encounter for procedure for purposes other than remedying health state, unspecified: Secondary | ICD-10-CM | POA: Diagnosis not present

## 2021-09-15 DIAGNOSIS — M1991 Primary osteoarthritis, unspecified site: Secondary | ICD-10-CM | POA: Insufficient documentation

## 2021-09-15 DIAGNOSIS — E876 Hypokalemia: Secondary | ICD-10-CM | POA: Diagnosis not present

## 2021-09-15 DIAGNOSIS — R911 Solitary pulmonary nodule: Secondary | ICD-10-CM | POA: Diagnosis not present

## 2021-09-15 DIAGNOSIS — Z9889 Other specified postprocedural states: Secondary | ICD-10-CM | POA: Diagnosis not present

## 2021-09-15 DIAGNOSIS — R918 Other nonspecific abnormal finding of lung field: Secondary | ICD-10-CM | POA: Diagnosis not present

## 2021-09-15 DIAGNOSIS — N3281 Overactive bladder: Secondary | ICD-10-CM

## 2021-09-15 HISTORY — PX: BRONCHIAL BIOPSY: SHX5109

## 2021-09-15 HISTORY — PX: BRONCHIAL WASHINGS: SHX5105

## 2021-09-15 HISTORY — PX: BRONCHIAL BRUSHINGS: SHX5108

## 2021-09-15 HISTORY — PX: BRONCHIAL NEEDLE ASPIRATION BIOPSY: SHX5106

## 2021-09-15 HISTORY — PX: VIDEO BRONCHOSCOPY WITH RADIAL ENDOBRONCHIAL ULTRASOUND: SHX6849

## 2021-09-15 HISTORY — DX: Emphysema, unspecified: J43.9

## 2021-09-15 HISTORY — PX: FIDUCIAL MARKER PLACEMENT: SHX6858

## 2021-09-15 LAB — CBC
HCT: 41.9 % (ref 36.0–46.0)
Hemoglobin: 13.9 g/dL (ref 12.0–15.0)
MCH: 29.8 pg (ref 26.0–34.0)
MCHC: 33.2 g/dL (ref 30.0–36.0)
MCV: 89.9 fL (ref 80.0–100.0)
Platelets: 197 10*3/uL (ref 150–400)
RBC: 4.66 MIL/uL (ref 3.87–5.11)
RDW: 13.3 % (ref 11.5–15.5)
WBC: 7.4 10*3/uL (ref 4.0–10.5)
nRBC: 0 % (ref 0.0–0.2)

## 2021-09-15 LAB — BASIC METABOLIC PANEL
Anion gap: 14 (ref 5–15)
BUN: 14 mg/dL (ref 8–23)
CO2: 19 mmol/L — ABNORMAL LOW (ref 22–32)
Calcium: 9.3 mg/dL (ref 8.9–10.3)
Chloride: 103 mmol/L (ref 98–111)
Creatinine, Ser: 0.91 mg/dL (ref 0.44–1.00)
GFR, Estimated: >60 mL/min (ref >60)
Glucose, Bld: 172 mg/dL — ABNORMAL HIGH (ref 70–99)
Potassium: 3.8 mmol/L (ref 3.5–5.1)
Sodium: 136 mmol/L (ref 135–145)

## 2021-09-15 LAB — SARS CORONAVIRUS 2 BY RT PCR (HOSPITAL ORDER, PERFORMED IN ~~LOC~~ HOSPITAL LAB): SARS Coronavirus 2: NEGATIVE

## 2021-09-15 LAB — GLUCOSE, CAPILLARY
Glucose-Capillary: 194 mg/dL — ABNORMAL HIGH (ref 70–99)
Glucose-Capillary: 155 mg/dL — ABNORMAL HIGH (ref 70–99)

## 2021-09-15 SURGERY — BRONCHOSCOPY, WITH BIOPSY USING ELECTROMAGNETIC NAVIGATION
Anesthesia: General | Laterality: Left

## 2021-09-15 MED ORDER — PHENYLEPHRINE HCL-NACL 20-0.9 MG/250ML-% IV SOLN
INTRAVENOUS | Status: DC | PRN
  Administered 2021-09-15: 20 ug/min via INTRAVENOUS

## 2021-09-15 MED ORDER — SUCCINYLCHOLINE CHLORIDE 200 MG/10ML IV SOSY
PREFILLED_SYRINGE | INTRAVENOUS | Status: DC | PRN
  Administered 2021-09-15: 140 mg via INTRAVENOUS

## 2021-09-15 MED ORDER — ROCURONIUM BROMIDE 10 MG/ML (PF) SYRINGE
PREFILLED_SYRINGE | INTRAVENOUS | Status: DC | PRN
Start: 2021-09-15 — End: 2021-09-15
  Administered 2021-09-15: 40 mg via INTRAVENOUS

## 2021-09-15 MED ORDER — FENTANYL CITRATE (PF) 100 MCG/2ML IJ SOLN
INTRAMUSCULAR | Status: DC | PRN
  Administered 2021-09-15: 50 ug via INTRAVENOUS

## 2021-09-15 MED ORDER — MIDAZOLAM HCL 2 MG/2ML IJ SOLN
INTRAMUSCULAR | Status: DC | PRN
Start: 2021-09-15 — End: 2021-09-15
  Administered 2021-09-15: 1 mg via INTRAVENOUS

## 2021-09-15 MED ORDER — LIDOCAINE 2% (20 MG/ML) 5 ML SYRINGE
INTRAMUSCULAR | Status: DC | PRN
  Administered 2021-09-15: 40 mg via INTRAVENOUS

## 2021-09-15 MED ORDER — SUGAMMADEX SODIUM 200 MG/2ML IV SOLN
INTRAVENOUS | Status: DC | PRN
Start: 2021-09-15 — End: 2021-09-15
  Administered 2021-09-15: 200 mg via INTRAVENOUS

## 2021-09-15 MED ORDER — CHLORHEXIDINE GLUCONATE 0.12 % MT SOLN
OROMUCOSAL | Status: AC
  Filled 2021-09-15: qty 15

## 2021-09-15 MED ORDER — ONDANSETRON HCL 4 MG/2ML IJ SOLN
INTRAMUSCULAR | Status: DC | PRN
  Administered 2021-09-15: 4 mg via INTRAVENOUS

## 2021-09-15 MED ORDER — LACTATED RINGERS IV SOLN: INTRAVENOUS | Status: DC

## 2021-09-15 MED ORDER — DEXAMETHASONE SODIUM PHOSPHATE 10 MG/ML IJ SOLN
INTRAMUSCULAR | Status: DC | PRN
  Administered 2021-09-15: 5 mg via INTRAVENOUS

## 2021-09-15 MED ORDER — ONDANSETRON HCL 4 MG/2ML IJ SOLN: 4.0000 mg | Freq: Once | INTRAMUSCULAR | Status: DC | PRN

## 2021-09-15 MED ORDER — CHLORHEXIDINE GLUCONATE 0.12 % MT SOLN
15.0000 mL | Freq: Once | OROMUCOSAL | Status: AC
  Administered 2021-09-15: 15 mL via OROMUCOSAL

## 2021-09-15 MED ORDER — PROPOFOL 10 MG/ML IV BOLUS
INTRAVENOUS | Status: DC | PRN
  Administered 2021-09-15: 100 mg via INTRAVENOUS

## 2021-09-15 MED ORDER — AMISULPRIDE (ANTIEMETIC) 5 MG/2ML IV SOLN: 10.0000 mg | Freq: Once | INTRAVENOUS | Status: DC | PRN

## 2021-09-15 SURGICAL SUPPLY — 1 items: superlock fiducial marker ×1 IMPLANT

## 2021-09-15 NOTE — Discharge Instructions (Signed)
Flexible Bronchoscopy, Care After This sheet gives you information about how to care for yourself after your test. Your doctor may also give you more specific instructions. If you have problems or questions, contact your doctor. Follow these instructions at home: Eating and drinking Do not eat or drink anything (not even water) for 2 hours after your test, or until your numbing medicine (local anesthetic) wears off. When your numbness is gone and your cough and gag reflexes have come back, you may: Eat only soft foods. Slowly drink liquids. The day after the test, go back to your normal diet. Driving Do not drive for 24 hours if you were given a medicine to help you relax (sedative). Do not drive or use heavy machinery while taking prescription pain medicine. General instructions  Take over-the-counter and prescription medicines only as told by your doctor. Return to your normal activities as told. Ask what activities are safe for you. Do not use any products that have nicotine or tobacco in them. This includes cigarettes and e-cigarettes. If you need help quitting, ask your doctor. Keep all follow-up visits as told by your doctor. This is important. It is very important if you had a tissue sample (biopsy) taken. Get help right away if: You have shortness of breath that gets worse. You get light-headed. You feel like you are going to pass out (faint). You have chest pain. You cough up: More than a little blood. More blood than before. Summary Do not eat or drink anything (not even water) for 2 hours after your test, or until your numbing medicine wears off. Do not use cigarettes. Do not use e-cigarettes. Get help right away if you have chest pain.  This information is not intended to replace advice given to you by your health care provider. Make sure you discuss any questions you have with your health care provider. Document Released: 06/27/2009 Document Revised: 08/12/2017 Document  Reviewed: 09/17/2016 Elsevier Patient Education  2020 [employer].

## 2021-09-15 NOTE — Op Note (Signed)
Video Bronchoscopy with Robotic Assisted Bronchoscopic Navigation   Date of Operation: 09/15/2021   Pre-op Diagnosis: Left lower lobe lung nodule  Post-op Diagnosis: Left lower lobe lung  Surgeon: Garner Nash, DO  Assistants: None   Anesthesia: General endotracheal anesthesia  Operation: Flexible video fiberoptic bronchoscopy with robotic assistance and biopsies.  Estimated Blood Loss: Minimal  Complications: None  Indications and History: Megan [name] is a 71 y.o. female with history of left lower lobe lung nodule. The risks, benefits, complications, treatment options and expected outcomes were discussed with the patient.  The possibilities of pneumothorax, pneumonia, reaction to medication, pulmonary aspiration, perforation of a viscus, bleeding, failure to diagnose a condition and creating a complication requiring transfusion or operation were discussed with the patient who freely signed the consent.    Description of Procedure: The patient was seen in the Preoperative Area, was examined and was deemed appropriate to proceed.  The patient was taken to [facility] endoscopy room 3, identified as Megan [name] and the procedure verified as Flexible Video Fiberoptic Bronchoscopy.  A Time Out was held and the above information confirmed.   Prior to the date of the procedure a high-resolution CT scan of the chest was performed. Utilizing ION software program a virtual tracheobronchial tree was generated to allow the creation of distinct navigation pathways to the patient's parenchymal abnormalities. After being taken to the operating room general anesthesia was initiated and the patient  was orally intubated. The video fiberoptic bronchoscope was introduced via the endotracheal tube and a general inspection was performed which showed normal right and left lung anatomy, aspiration of the bilateral mainstems was completed to remove any remaining secretions. Robotic catheter inserted into  patient's endotracheal tube.   Target #1 left lower lobe: The distinct navigation pathways prepared prior to this procedure were then utilized to navigate to patient's lesion identified on CT scan. The robotic catheter was secured into place and the vision probe was withdrawn.  Lesion location was approximated using fluoroscopy, three-dimensional cone beam CT imaging, radial endobronchial ultrasound for peripheral targeting. Under fluoroscopic guidance transbronchial needle brushings, transbronchial needle biopsies, and transbronchial forceps biopsies were performed to be sent for cytology and pathology. A bronchioalveolar lavage was performed in the LLL and sent for cytology.  At the end of the procedure a general airway inspection was performed and there was no evidence of active bleeding. The bronchoscope was removed.  The patient tolerated the procedure well. There was no significant blood loss and there were no obvious complications. A post-procedural chest x-ray is pending.  Samples Target #1: 1. Transbronchial needle brushings from left lower lobe 2. Transbronchial Wang needle biopsies from left lower lobe 3. Transbronchial forceps biopsies from left lower lobe 4. Bronchoalveolar lavage from left lower lobe  Plans:  The patient will be discharged from the PACU to home when recovered from anesthesia and after chest x-ray is reviewed. We will review the cytology, pathology and microbiology results with the patient when they become available. Outpatient followup will be with Garner Nash, DO.   Garner Nash, DO [facility] 09/15/2021 11:10 AM

## 2021-09-15 NOTE — Anesthesia Postprocedure Evaluation (Signed)
Anesthesia Post Note  Patient: Megan [name]  Procedure(s) Performed: ROBOTIC ASSISTED NAVIGATIONAL BRONCHOSCOPY (Left) BRONCHIAL BIOPSIES BRONCHIAL BRUSHINGS BRONCHIAL NEEDLE ASPIRATION BIOPSIES VIDEO BRONCHOSCOPY WITH RADIAL ENDOBRONCHIAL ULTRASOUND FIDUCIAL MARKER PLACEMENT BRONCHIAL WASHINGS     Patient location during evaluation: PACU Anesthesia Type: General Level of consciousness: awake and alert Pain management: pain level controlled Vital Signs Assessment: post-procedure vital signs reviewed and stable Respiratory status: spontaneous breathing, nonlabored ventilation, respiratory function stable and patient connected to nasal cannula oxygen Cardiovascular status: blood pressure returned to baseline and stable Postop Assessment: no apparent nausea or vomiting Anesthetic complications: no   No notable events documented.  Last Vitals:  Vitals:   09/15/21 1149 09/15/21 1150  BP:  (!) 155/74  Pulse: 77 71  Resp: 13 12  Temp:  36.7 C  SpO2: 99% 99%    Last Pain:  Vitals:   09/15/21 1150  TempSrc:   PainSc: 0-No pain                 Effie Berkshire

## 2021-09-15 NOTE — H&P (Signed)
Synopsis: Referred in December 2022 for lung nodule by Pleas Koch, NP   Subjective:    PATIENT ID: Megan [name] GENDER: female DOB: 09/16/1950, MRN: [id number]       Chief Complaint  Patient presents with   Pulmonary Consult      Lung nodule- referred by Eric Form, NP.       This is a 71 year old female, past medical history of reflux, pneumonia, arthritis.Patient had a abnormal lung cancer screening CT.  She has been enrolled in the program for several years follow with Eric Form, NP.  Last lung cancer screening CT was on 07/01/2021 which was read as a lung RADS 4B.  Patient was found to have a left lower lobe 13.5 mm lung nodule concerning for malignancy.  Patient ultimately had a nuclear medicine PET scan that was ordered and completed on 07/20/2021.  The left lower lobe lesion had low-grade/low-level FDG uptake.  Concern for possibility of a low-grade neoplasm due to its low-level FDG uptake.  Patient presents today to discuss next steps regarding abnormal finding on CT scan.  At baseline the patient uses a rolling walker and sometimes a wheelchair for ambulation or mobility.  OV 09/15/2020: here today for planned outpatient bronchoscopy.          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   Constipation due to pain medication     Debility     Dyspnea      W/ PHYS CONDITION    Foul smelling urine 07/22/2020   GERD (gastroesophageal reflux disease)     History of bronchitis      "not since I quit smoking" (07/26/2017)   History of kidney stones     History of shingles     Hyperlipidemia      takes Fish Oil daily   Hypertension     Hypoalbuminemia due to protein-calorie malnutrition ([place])     L3 vertebral fracture ([place]) 09/01/2017   Myelopathy ([place]) 12/21/2016   Neuromuscular disorder  (HCC)      tingling toes   Paraparesis ([place]) 12/15/2016   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   Pneumonia 2009   PONV (postoperative nausea and vomiting)     Radiculopathy 07/28/2017   Restless leg     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   Type 2 diabetes mellitus ([place])     Uterine cancer ([place]) 1979    S/P hysterectomy   Weakness      numbness and tingling in both feet r/t back           Family History  Problem Relation Age of Onset   Diabetes Mother     Dementia Mother     Cirrhosis Mother          Non alcoholic   COPD Father     Diabetes Brother             Past Surgical History:  Procedure Laterality Date   ABDOMINAL EXPOSURE N/A 07/26/2017    Procedure: ABDOMINAL EXPOSURE;  Surgeon: Angelia Mould, MD;  Location: [place];  Service: Vascular;  Laterality: N/A;   ANTERIOR CERVICAL DECOMP/DISCECTOMY FUSION   2001   ANTERIOR LUMBAR FUSION N/A 07/26/2017    Procedure: Lumbar five-Sacral one  Anterior lumbar interbody fusion with Dr. Deitra Mayo for approach;  Surgeon: Ditty, Kevan Ny, MD;  Location: [place];  Service: Neurosurgery;  Laterality: N/A;   APPLICATION OF ROBOTIC ASSISTANCE FOR SPINAL PROCEDURE   12/19/2016    Procedure: APPLICATION OF ROBOTIC ASSISTANCE FOR SPINAL PROCEDURE;  Surgeon: Kevan Ny Ditty, MD;  Location: [place];  Service: Neurosurgery;;   APPLICATION OF ROBOTIC ASSISTANCE FOR SPINAL PROCEDURE N/A 07/26/2017    Procedure: APPLICATION OF ROBOTIC ASSISTANCE FOR SPINAL PROCEDURE;  Surgeon: Ditty, Kevan Ny, MD;  Location: [place];  Service: Neurosurgery;  Laterality: N/A;   APPLICATION OF ROBOTIC ASSISTANCE FOR SPINAL PROCEDURE N/A 09/02/2017    Procedure: APPLICATION OF ROBOTIC ASSISTANCE FOR SPINAL PROCEDURE;  Surgeon: Ditty, Kevan Ny, MD;  Location: [place];  Service: Neurosurgery;  Laterality: N/A;   BACK SURGERY       BLADDER SUSPENSION   1991     tack   INGUINAL HERNIA REPAIR Right 1991   LUMBAR FUSION   2016; 2017; 07/26/2017    L4-5; L2-3; L5-S1   POSTERIOR LUMBAR FUSION 4 WITH HARDWARE REMOVAL N/A 09/02/2017    Procedure: Lumbar three-four redo laminectomy; Repositioning of Left Sacral two screw; Extension of lumbar fusion to Thoracic twelve;  Surgeon: Ditty, Kevan Ny, MD;  Location: [facility];  Service: Neurosurgery;  Laterality: N/A;   TONSILLECTOMY AND ADENOIDECTOMY   1959   TUMOR EXCISION        WERTHIN'S TUMORS BOTH SIDES OF NECK   VAGINAL HYSTERECTOMY   1979      Social History         Socioeconomic History   Marital status: Married      Spouse name: [name]   Number of children: 1   Years of education: 12   Highest education level: Not on file  Occupational History   Not on file  Tobacco Use   Smoking status: Former      Packs/day: 1.50      Years: 35.00      Pack years: 52.50      Types: Cigarettes      Quit date: 05/13/2006      Years since quitting: 15.2   Smokeless tobacco: Never  Vaping Use   Vaping Use: Never used  Substance and Sexual Activity   Alcohol use: Yes      Comment: 07/26/2017 "glass of wine once/wk if not on RX"   Drug use: No   Sexual activity: Not Currently      Birth control/protection: Surgical  Other Topics Concern   Not on file  Social History Narrative    Lives w/ husband    Married.    1 child, 1 grandchildren.    Retired. Once worked for [employer].    Enjoys reading.     Social Determinants of Health       Financial Resource Strain: Low Risk    Difficulty of Paying Living Expenses: Not hard at all  Food Insecurity: No Food Insecurity   Worried About Charity fundraiser in the Last Year: Never true   [place] in the Last Year: Never true  Transportation Needs: No Transportation Needs   Lack of Transportation (Medical): No   Lack of Transportation (Non-Medical): No  Physical Activity: Inactive   Days of Exercise per Week: 0 days   Minutes of Exercise per  Session: 0 min  Stress: No Stress Concern Present   Feeling of Stress : Not at all  Social Connections: Not on  file  Intimate Partner Violence: Not At Risk   Fear of Current or Ex-Partner: No   Emotionally Abused: No   Physically Abused: No   Sexually Abused: No           Allergies  Allergen Reactions   Shellfish Allergy Anaphylaxis, Swelling and Other (See Comments)      Tongue swells   Oxycodone Nausea Only   Penicillins Rash and Other (See Comments)      Has patient had a PCN reaction causing immediate rash, facial/tongue/throat swelling, SOB or lightheadedness with hypotension: Yes Has patient had a PCN reaction causing severe rash involving mucus membranes or skin necrosis: No Has patient had a PCN reaction that required hospitalization No Has patient had a PCN reaction occurring within the last 10 years: No If all of the above answers are "NO", then may proceed with Cephalosporin use.            Outpatient Medications Prior to Visit  Medication Sig Dispense Refill   atorvastatin (LIPITOR) 40 MG tablet Take 1 tablet (40 mg total) by mouth daily. For cholesterol. 90 tablet 3   baclofen (LIORESAL) 10 MG tablet TAKE 1 TABLET(10 MG) BY MOUTH THREE TIMES DAILY AS NEEDED FOR MUSCLE SPASMS 180 tablet 3   Biotin 5000 MCG CAPS Take 5,000 mcg by mouth every morning.        celecoxib (CELEBREX) 200 MG capsule TAKE 1 CAPSULE(200 MG) BY MOUTH ONCE TO TWICE DAILY AS NEEDED FOR MODERATE PAIN 180 capsule 0   cetirizine (ZYRTEC) 10 MG tablet Take 1 tablet (10 mg total) by mouth daily. For allergies 90 tablet 0   cholecalciferol (VITAMIN D) 1000 units tablet Take 2 tablets (2,000 Units total) by mouth daily after lunch. (Patient taking differently: Take 1,000 Units by mouth daily after lunch.) 30 tablet 0   Coenzyme Q10 (COQ10) 100 MG CAPS Take 100 mg daily by mouth.        esomeprazole (NEXIUM 24HR) 20 MG capsule Take 1 capsule (20 mg total) by mouth daily at 12 noon. 90 capsule 1    fluticasone (FLONASE) 50 MCG/ACT nasal spray SHAKE LIQUID AND USE 1 SPRAY IN EACH NOSTRIL TWICE DAILY 48 g 1   gabapentin (NEURONTIN) 300 MG capsule Take 1 capsule (300 mg total) by mouth 2 (two) times daily. For pain. 287 capsule 3   Garlic 867 MG CAPS Take 500 mg 2 (two) times daily by mouth.       lisinopril (ZESTRIL) 20 MG tablet Take 1 tablet (20 mg total) by mouth daily. For blood pressure. 90 tablet 3   metFORMIN (GLUCOPHAGE) 500 MG tablet TAKE 1 TABLET BY MOUTH TWICE DAILY for diabetes. 180 tablet 3   mupirocin cream (BACTROBAN) 2 % Apply 1 application topically 2 (two) times daily. 15 g 0   oxybutynin (DITROPAN-XL) 5 MG 24 hr tablet Take 1 tablet (5 mg total) by mouth at bedtime. For overactive bladder 90 tablet 0   senna-docusate (SENOKOT-S) 8.6-50 MG tablet Take 2 tablets by mouth 2 (two) times daily. (Patient taking differently: Take 1-2 tablets by mouth daily as needed for mild constipation.)        No facility-administered medications prior to visit.     Review of Systems  Constitutional:  Negative for chills, fever, malaise/fatigue and weight loss.  HENT:  Negative for hearing loss, sore throat and tinnitus.   Eyes:  Negative for blurred vision and double vision.  Respiratory:  Negative for cough, hemoptysis, sputum production, shortness of breath,  wheezing and stridor.   Cardiovascular:  Negative for chest pain, palpitations, orthopnea, leg swelling and PND.  Gastrointestinal:  Negative for abdominal pain, constipation, diarrhea, heartburn, nausea and vomiting.  Genitourinary:  Negative for dysuria, hematuria and urgency.  Musculoskeletal:  Negative for joint pain and myalgias.  Skin:  Negative for itching and rash.  Neurological:  Negative for dizziness, tingling, weakness and headaches.  Endo/Heme/Allergies:  Negative for environmental allergies. Does not bruise/bleed easily.  Psychiatric/Behavioral:  Negative for depression. The patient is not nervous/anxious and does not  have insomnia.   All other systems reviewed and are negative.      Objective:   General appearance: 71 y.o., female, NAD, conversant  Eyes: anicteric sclerae, moist conjunctivae; no lid-lag; PERRLA, tracking appropriately HENT: NCAT; oropharynx, MMM, no mucosal ulcerations; normal hard and soft palate Neck: Trachea midline; FROM, supple, lymphadenopathy, no JVD Lungs: CTAB, no crackles, no wheeze, with normal respiratory effort and no intercostal retractions CV: RRR, S1, S2, no MRGs  Abdomen: Soft, non-tender; non-distended, BS present  Extremities: No peripheral edema, radial and DP pulses present bilaterally  Skin: Normal temperature, turgor and texture; no rash Psych: Appropriate affect Neuro: Alert and oriented to person and place, no focal deficit     BP (!) 194/83    Pulse 99    Temp 98.3 F (36.8 C) (Oral)    Resp 17    Ht 5\' 7"  (1.702 m)    Wt 78.9 kg    SpO2 96%    BMI 27.25 kg/m    96% on RA    BMI Readings from Last 3 Encounters:  08/13/21 27.57 kg/m  07/31/21 27.57 kg/m  06/23/21 27.57 kg/m       Wt Readings from Last 3 Encounters:  08/13/21 176 lb (79.8 kg)  07/31/21 176 lb (79.8 kg)  06/23/21 176 lb (79.8 kg)        CBC Labs (Brief)          Component Value Date/Time    WBC 5.5 07/31/2021 1600    RBC 4.44 07/31/2021 1600    HGB 13.0 07/31/2021 1600    HCT 38.9 07/31/2021 1600    PLT 208 07/31/2021 1600    MCV 87.6 07/31/2021 1600    MCH 29.3 07/31/2021 1600    MCHC 33.4 07/31/2021 1600    RDW 13.1 07/31/2021 1600    LYMPHSABS 0.8 09/08/2017 0935    MONOABS 0.6 09/08/2017 0935    EOSABS 0.2 09/08/2017 0935    BASOSABS 0.0 09/08/2017 0935          Chest Imaging: 07/01/2021 lung cancer screening CT: Abnormal left lower lobe nodule.  This has been present since January 2022.  There has been some slow evolution of the nodule. But has remained persistent. The patient's images have been independently reviewed by me.     Nuclear medicine pet  imaging 07/20/2021: Low-level PET uptake. Cannot exclude low-grade malignancy. The patient's images have been independently reviewed by me.     Pulmonary Functions Testing Results: No flowsheet data found.   FeNO:    Pathology:    Echocardiogram:    Heart Catheterization:     Assessment & Plan:        ICD-10-CM    1. Lung nodule seen on imaging study  R91.1 Procedural/ Surgical Case Request: ROBOTIC ASSISTED NAVIGATIONAL BRONCHOSCOPY      Ambulatory referral to Pulmonology      CANCELED: Ambulatory referral to Pulmonology     2. Former smoker  [phone]  3. Personal history of tobacco use, presenting hazards to health  Z87.891       4. Abnormal CT lung screening  R91.8           Discussion: This is a 71 year old female with a left lower lobe pulmonary nodule.  It has showed some evolution and change slightly larger in size from its previous documentation in January 2022.  She has had subsequent lung cancer screening lung nodule follow-up CTs.  Ultimately led to a nuclear medicine PET scan which shows low-level uptake.  Plan: here today for outpatient bronchoscopy. Agreeable to proceed. No barriers at this time.    Garner Nash, DO [facility] 09/15/2021 9:48 AM

## 2021-09-15 NOTE — Anesthesia Procedure Notes (Signed)
Procedure Name: Intubation Date/Time: 09/15/2021 10:15 AM Performed by: Reece Agar, CRNA Pre-anesthesia Checklist: Patient identified, Emergency Drugs available, Suction available and Patient being monitored Patient Re-evaluated:Patient Re-evaluated prior to induction Oxygen Delivery Method: Circle System Utilized Preoxygenation: Pre-oxygenation with 100% oxygen Induction Type: IV induction Ventilation: Mask ventilation without difficulty Laryngoscope Size: Mac and 3 Grade View: Grade I Tube type: Oral Number of attempts: 1 Airway Equipment and Method: Stylet Placement Confirmation: ETT inserted through vocal cords under direct vision, positive ETCO2 and breath sounds checked- equal and bilateral Secured at: 21 cm Tube secured with: Tape Dental Injury: Teeth and Oropharynx as per pre-operative assessment

## 2021-09-15 NOTE — Anesthesia Preprocedure Evaluation (Addendum)
Anesthesia Evaluation  Patient identified by MRN, date of birth, ID band Patient awake    Reviewed: Allergy & Precautions, NPO status , Patient's Chart, lab work & pertinent test results  History of Anesthesia Complications (+) PONV  Airway Mallampati: I  TM Distance: >3 FB Neck ROM: Full    Dental  (+) Edentulous Upper, Edentulous Lower   Pulmonary COPD, former smoker,     + decreased breath sounds      Cardiovascular hypertension, Pt. on medications  Rhythm:Regular Rate:Normal     Neuro/Psych  Neuromuscular disease    GI/Hepatic Neg liver ROS, GERD  Medicated,  Endo/Other  diabetes, Type 2, Oral Hypoglycemic Agents  Renal/GU negative Renal ROS     Musculoskeletal  (+) Arthritis ,   Abdominal Normal abdominal exam  (+)   Peds  Hematology  (+) anemia ,   Anesthesia Other Findings   Reproductive/Obstetrics                            Anesthesia Physical Anesthesia Plan  ASA: 3  Anesthesia Plan: General   Post-op Pain Management:    Induction: Intravenous  PONV Risk Score and Plan: 0 and Ondansetron  Airway Management Planned: Oral ETT  Additional Equipment: None  Intra-op Plan:   Post-operative Plan: Extubation in OR  Informed Consent: I have reviewed the patients History and Physical, chart, labs and discussed the procedure including the risks, benefits and alternatives for the proposed anesthesia with the patient or authorized representative who has indicated his/her understanding and acceptance.       Plan Discussed with: CRNA  Anesthesia Plan Comments:        Anesthesia Quick Evaluation

## 2021-09-15 NOTE — Transfer of Care (Signed)
Immediate Anesthesia Transfer of Care Note  Patient: Megan [name]  Procedure(s) Performed: ROBOTIC ASSISTED NAVIGATIONAL BRONCHOSCOPY (Left) BRONCHIAL BIOPSIES BRONCHIAL BRUSHINGS BRONCHIAL NEEDLE ASPIRATION BIOPSIES VIDEO BRONCHOSCOPY WITH RADIAL ENDOBRONCHIAL ULTRASOUND FIDUCIAL MARKER PLACEMENT BRONCHIAL WASHINGS  Patient Location: PACU  Anesthesia Type:General  Level of Consciousness: oriented and drowsy  Airway & Oxygen Therapy: Patient Spontanous Breathing  Post-op Assessment: Report given to RN  Post vital signs: Reviewed and stable  Last Vitals:  Vitals Value Taken Time  BP 168/95 09/15/21 1119  Temp    Pulse 99 09/15/21 1119  Resp 13 09/15/21 1119  SpO2 98 % 09/15/21 1119  Vitals shown include unvalidated device data.  Last Pain:  Vitals:   09/15/21 0928  TempSrc:   PainSc: 0-No pain      Patients Stated Pain Goal: 1 (97/67/34 1937)  Complications: No notable events documented.

## 2021-09-16 LAB — ACID FAST SMEAR (AFB, MYCOBACTERIA): Acid Fast Smear: NEGATIVE

## 2021-09-17 LAB — CULTURE, BAL-QUANTITATIVE W GRAM STAIN
Culture: NO GROWTH
Gram Stain: NONE SEEN

## 2021-09-17 LAB — CYTOLOGY - NON PAP

## 2021-09-18 ENCOUNTER — Encounter (HOSPITAL_COMMUNITY): Payer: Self-pay | Admitting: Pulmonary Disease

## 2021-09-18 NOTE — Progress Notes (Signed)
Please let patient know that I have reviewed her bronchoscopy results.  Cultures with no growth of any organisms at this time.  And her pathology results with no cancer cells.  We will discuss all of the results in detail at her next office visit.  Thanks,  BLI  Garner Nash, DO [facility] 09/18/2021 12:41 AM

## 2021-09-20 LAB — AEROBIC/ANAEROBIC CULTURE W GRAM STAIN (SURGICAL/DEEP WOUND)
Culture: NO GROWTH
Gram Stain: NONE SEEN

## 2021-09-23 NOTE — Telephone Encounter (Signed)
Created in error

## 2021-09-25 ENCOUNTER — Other Ambulatory Visit: Payer: Self-pay

## 2021-09-25 ENCOUNTER — Encounter: Payer: Self-pay | Admitting: Pulmonary Disease

## 2021-09-25 ENCOUNTER — Ambulatory Visit: Payer: PPO | Admitting: Pulmonary Disease

## 2021-09-25 VITALS — BP 152/80 | HR 83 | Temp 98.0°F | Ht 67.0 in | Wt 174.8 lb

## 2021-09-25 DIAGNOSIS — Z87891 Personal history of nicotine dependence: Secondary | ICD-10-CM | POA: Diagnosis not present

## 2021-09-25 DIAGNOSIS — R918 Other nonspecific abnormal finding of lung field: Secondary | ICD-10-CM

## 2021-09-25 DIAGNOSIS — R911 Solitary pulmonary nodule: Secondary | ICD-10-CM | POA: Diagnosis not present

## 2021-09-25 NOTE — Progress Notes (Signed)
Synopsis: Referred in December 2022 for lung nodule by Pleas Koch, NP  Subjective:   PATIENT ID: Megan [name] GENDER: female DOB: [birth year], MRN: [id number]  Chief Complaint  Patient presents with   Follow-up    Patient is here to discuss results of procedure.     This is a 71 year old female, past medical history of reflux, pneumonia, arthritis.Patient had a abnormal lung cancer screening CT.  She has been enrolled in the program for several years follow with Eric Form, NP.  Last lung cancer screening CT was on 07/01/2021 which was read as a lung RADS 4B.  Patient was found to have a left lower lobe 13.5 mm lung nodule concerning for malignancy.  Patient ultimately had a nuclear medicine PET scan that was ordered and completed on 07/20/2021.  The left lower lobe lesion had low-grade/low-level FDG uptake.  Concern for possibility of a low-grade neoplasm due to its low-level FDG uptake.  Patient presents today to discuss next steps regarding abnormal finding on CT scan.  At baseline the patient uses a rolling walker and sometimes a wheelchair for ambulation or mobility.  OV 09/25/2021: This is a 71 year old female, past history of lower lobe lung nodule with a lung RADS 4B.  She was taken for bronchoscopy tissue biopsies were negative for malignancy cultures with no growth to date.  We reviewed this today in the office.   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   Constipation due to pain medication    Debility    Dyspnea    W/ PHYS CONDITION    Emphysema of lung ([place])    Foul smelling urine 07/22/2020   GERD (gastroesophageal reflux disease)    History of bronchitis    "not since I quit smoking" (07/26/2017)   History of kidney stones    History of shingles    Hyperlipidemia    takes Fish  Oil daily   Hypertension    Hypoalbuminemia due to protein-calorie malnutrition ([place])    L3 vertebral fracture ([place]) 09/01/2017   Myelopathy ([place]) 12/21/2016   Neuromuscular disorder ([facility])    tingling toes   Paraparesis ([place]) 12/15/2016   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   Pneumonia 2009   PONV (postoperative nausea and vomiting)    Radiculopathy 07/28/2017   Restless leg    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   Type 2 diabetes mellitus ([place])    Uterine cancer ([place]) 1979   S/P hysterectomy   Weakness    numbness and tingling in both feet r/t back     Family History  Problem Relation Age of Onset   Diabetes Mother    Dementia Mother    Cirrhosis Mother        Non alcoholic   COPD Father    Diabetes Brother      Past Surgical History:  Procedure Laterality Date   ABDOMINAL EXPOSURE N/A 07/26/2017   Procedure: ABDOMINAL EXPOSURE;  Surgeon: Angelia Mould, MD;  Location: [facility];  Service: Vascular;  Laterality: N/A;   ANTERIOR CERVICAL DECOMP/DISCECTOMY FUSION  2001   ANTERIOR LUMBAR FUSION N/A 07/26/2017   Procedure: Lumbar five-Sacral one Anterior lumbar interbody fusion with Dr. Deitra Mayo for approach;  Surgeon: Ditty, Kevan Ny, MD;  Location: [facility]  OR;  Service: Neurosurgery;  Laterality: N/A;   APPLICATION OF ROBOTIC ASSISTANCE FOR SPINAL PROCEDURE  12/19/2016   Procedure: APPLICATION OF ROBOTIC ASSISTANCE FOR SPINAL PROCEDURE;  Surgeon: Kevan Ny Ditty, MD;  Location: [facility];  Service: Neurosurgery;;   APPLICATION OF ROBOTIC ASSISTANCE FOR SPINAL PROCEDURE N/A 07/26/2017   Procedure: APPLICATION OF ROBOTIC ASSISTANCE FOR SPINAL PROCEDURE;  Surgeon: Ditty, Kevan Ny, MD;  Location: [facility];  Service: Neurosurgery;  Laterality: N/A;   APPLICATION OF ROBOTIC ASSISTANCE FOR SPINAL PROCEDURE N/A 09/02/2017   Procedure: APPLICATION OF ROBOTIC ASSISTANCE FOR SPINAL  PROCEDURE;  Surgeon: Ditty, Kevan Ny, MD;  Location: [place];  Service: Neurosurgery;  Laterality: N/A;   BACK SURGERY     BLADDER SUSPENSION  1991   tack   BRONCHIAL BIOPSY  09/15/2021   Procedure: BRONCHIAL BIOPSIES;  Surgeon: Garner Nash, DO;  Location: [facility];  Service: Pulmonary;;   BRONCHIAL BRUSHINGS  09/15/2021   Procedure: BRONCHIAL BRUSHINGS;  Surgeon: Garner Nash, DO;  Location: [facility];  Service: Pulmonary;;   BRONCHIAL NEEDLE ASPIRATION BIOPSY  09/15/2021   Procedure: BRONCHIAL NEEDLE ASPIRATION BIOPSIES;  Surgeon: Garner Nash, DO;  Location: [facility];  Service: Pulmonary;;   BRONCHIAL WASHINGS  09/15/2021   Procedure: BRONCHIAL WASHINGS;  Surgeon: Garner Nash, DO;  Location: [facility];  Service: Pulmonary;;   FIDUCIAL MARKER PLACEMENT  09/15/2021   Procedure: FIDUCIAL MARKER PLACEMENT;  Surgeon: Garner Nash, DO;  Location: [facility];  Service: Pulmonary;;   INGUINAL HERNIA REPAIR Right 1991   LUMBAR FUSION  2016; 2017; 07/26/2017   L4-5; L2-3; L5-S1   POSTERIOR LUMBAR FUSION 4 WITH HARDWARE REMOVAL N/A 09/02/2017   Procedure: Lumbar three-four redo laminectomy; Repositioning of Left Sacral two screw; Extension of lumbar fusion to Thoracic twelve;  Surgeon: Ditty, Kevan Ny, MD;  Location: [facility];  Service: Neurosurgery;  Laterality: N/A;   TONSILLECTOMY AND ADENOIDECTOMY  1959   TUMOR EXCISION     WERTHIN'S TUMORS BOTH SIDES OF NECK   VAGINAL HYSTERECTOMY  1979   VIDEO BRONCHOSCOPY WITH RADIAL ENDOBRONCHIAL ULTRASOUND  09/15/2021   Procedure: VIDEO BRONCHOSCOPY WITH RADIAL ENDOBRONCHIAL ULTRASOUND;  Surgeon: Garner Nash, DO;  Location: [facility];  Service: Pulmonary;;    Social History   Socioeconomic History   Marital status: Married    Spouse name: [name]   Number of children: 1   Years of education: 12   Highest education level: Not on file  Occupational History   Not on file  Tobacco Use   Smoking status: Former     Packs/day: 1.50    Years: 35.00    Pack years: 52.50    Types: Cigarettes    Quit date: 05/13/2006    Years since quitting: 15.3   Smokeless tobacco: Never  Vaping Use   Vaping Use: Never used  Substance and Sexual Activity   Alcohol use: Yes    Comment: occasional   Drug use: No   Sexual activity: Not Currently    Birth control/protection: Surgical  Other Topics Concern   Not on file  Social History Narrative   Lives w/ husband   Married.   1 child, 1 grandchildren.   Retired. Once worked for [employer].   Enjoys reading.    Social Determinants of Health   Financial Resource Strain: Low Risk    Difficulty of Paying Living Expenses: Not hard at all  Food Insecurity: No Food Insecurity   Worried About Charity fundraiser in  the Last Year: Never true   Ran Out of Food in the Last Year: Never true  Transportation Needs: No Transportation Needs   Lack of Transportation (Medical): No   Lack of Transportation (Non-Medical): No  Physical Activity: Inactive   Days of Exercise per Week: 0 days   Minutes of Exercise per Session: 0 min  Stress: No Stress Concern Present   Feeling of Stress : Not at all  Social Connections: Not on file  Intimate Partner Violence: Not At Risk   Fear of Current or Ex-Partner: No   Emotionally Abused: No   Physically Abused: No   Sexually Abused: No     Allergies  Allergen Reactions   Shellfish Allergy Anaphylaxis, Swelling and Other (See Comments)    Tongue swells   Oxycodone Nausea Only   Penicillins Rash and Other (See Comments)    Has patient had a PCN reaction causing immediate rash, facial/tongue/throat swelling, SOB or lightheadedness with hypotension: Yes Has patient had a PCN reaction causing severe rash involving mucus membranes or skin necrosis: No Has patient had a PCN reaction that required hospitalization No Has patient had a PCN reaction occurring within the last 10 years: No If all of the above answers are "NO", then may  proceed with Cephalosporin use.     Outpatient Medications Prior to Visit  Medication Sig Dispense Refill   atorvastatin (LIPITOR) 40 MG tablet Take 1 tablet (40 mg total) by mouth daily. For cholesterol. (Patient taking differently: Take 40 mg by mouth every evening. For cholesterol.) 90 tablet 3   baclofen (LIORESAL) 10 MG tablet TAKE 1 TABLET(10 MG) BY MOUTH THREE TIMES DAILY AS NEEDED FOR MUSCLE SPASMS 180 tablet 3   Biotin 5000 MCG CAPS Take 5,000 mcg by mouth every morning.      celecoxib (CELEBREX) 200 MG capsule TAKE 1 CAPSULE(200 MG) BY MOUTH ONCE TO TWICE DAILY AS NEEDED FOR MODERATE PAIN (Patient taking differently: Take 200 mg by mouth every evening. TAKE 1 CAPSULE(200 MG) BY MOUTH ONCE TO TWICE DAILY AS NEEDED FOR MODERATE PAIN) 180 capsule 0   cetirizine (ZYRTEC) 10 MG tablet Take 1 tablet (10 mg total) by mouth daily. For allergies (Patient taking differently: Take 10 mg by mouth every evening. For allergies) 90 tablet 0   cholecalciferol (VITAMIN D) 1000 units tablet Take 2 tablets (2,000 Units total) by mouth daily after lunch. (Patient taking differently: Take 1,000 Units by mouth daily after lunch.) 30 tablet 0   Coenzyme Q10 (COQ10 PO) Take 120 mg by mouth every evening.     esomeprazole (NEXIUM 24HR) 20 MG capsule Take 1 capsule (20 mg total) by mouth daily at 12 noon. 90 capsule 1   fluticasone (FLONASE) 50 MCG/ACT nasal spray SHAKE LIQUID AND USE 1 SPRAY IN EACH NOSTRIL TWICE DAILY (Patient taking differently: 1 spray 2 (two) times daily as needed for allergies.) 48 g 1   gabapentin (NEURONTIN) 300 MG capsule Take 1 capsule (300 mg total) by mouth 2 (two) times daily. For pain. (Patient taking differently: Take 300 mg by mouth in the morning, at noon, and at bedtime. For pain.) 321 capsule 3   Garlic 224 MG CAPS Take 500 mg 2 (two) times daily by mouth.     ibuprofen (ADVIL) 200 MG tablet Take 400-600 mg by mouth every 8 (eight) hours as needed (pain).     lisinopril (ZESTRIL)  20 MG tablet Take 1 tablet (20 mg total) by mouth daily. For blood pressure. (Patient taking differently: Take  40 mg by mouth daily. For blood pressure.) 90 tablet 3   metFORMIN (GLUCOPHAGE) 500 MG tablet TAKE 1 TABLET BY MOUTH TWICE DAILY for diabetes. 180 tablet 3   mupirocin cream (BACTROBAN) 2 % Apply 1 application topically 2 (two) times daily. 15 g 0   oxybutynin (DITROPAN-XL) 5 MG 24 hr tablet TAKE 1 TABLET(5 MG) BY MOUTH AT BEDTIME FOR OVERACTIVE BLADDER 90 tablet 2   Polyethyl Glycol-Propyl Glycol (LUBRICANT EYE DROPS) 0.4-0.3 % SOLN Place 1-2 drops into both eyes 3 (three) times daily as needed (dry/irritated eyes.).     senna-docusate (SENOKOT-S) 8.6-50 MG tablet Take 2 tablets by mouth 2 (two) times daily. (Patient taking differently: Take 1 tablet by mouth every evening.)     No facility-administered medications prior to visit.    Review of Systems  Constitutional:  Negative for chills, fever, malaise/fatigue and weight loss.  HENT:  Negative for hearing loss, sore throat and tinnitus.   Eyes:  Negative for blurred vision and double vision.  Respiratory:  Negative for cough, hemoptysis, sputum production, shortness of breath, wheezing and stridor.   Cardiovascular:  Negative for chest pain, palpitations, orthopnea, leg swelling and PND.  Gastrointestinal:  Negative for abdominal pain, constipation, diarrhea, heartburn, nausea and vomiting.  Genitourinary:  Negative for dysuria, hematuria and urgency.  Musculoskeletal:  Negative for joint pain and myalgias.  Skin:  Negative for itching and rash.  Neurological:  Negative for dizziness, tingling, weakness and headaches.  Endo/Heme/Allergies:  Negative for environmental allergies. Does not bruise/bleed easily.  Psychiatric/Behavioral:  Negative for depression. The patient is not nervous/anxious and does not have insomnia.   All other systems reviewed and are negative.   Objective:  Physical Exam Vitals reviewed.  Constitutional:       General: She is not in acute distress.    Appearance: She is well-developed.  HENT:     Head: Normocephalic and atraumatic.  Eyes:     General: No scleral icterus.    Conjunctiva/sclera: Conjunctivae normal.     Pupils: Pupils are equal, round, and reactive to light.  Neck:     Vascular: No JVD.     Trachea: No tracheal deviation.  Cardiovascular:     Rate and Rhythm: Normal rate and regular rhythm.     Heart sounds: No murmur heard. Pulmonary:     Effort: Pulmonary effort is normal. No tachypnea, accessory muscle usage or respiratory distress.     Breath sounds: Normal breath sounds. No stridor. No wheezing, rhonchi or rales.  Abdominal:     General: There is no distension.     Palpations: Abdomen is soft.     Tenderness: There is no abdominal tenderness.  Musculoskeletal:        General: No tenderness.     Cervical back: Neck supple.  Lymphadenopathy:     Cervical: No cervical adenopathy.  Skin:    General: Skin is warm and dry.     Capillary Refill: Capillary refill takes less than 2 seconds.     Findings: No rash.  Neurological:     Mental Status: She is alert and oriented to person, place, and time.  Psychiatric:        Behavior: Behavior normal.     Vitals:   09/25/21 1443  BP: (!) 152/80  Pulse: 83  Temp: 98 F (36.7 C)  TempSrc: Oral  SpO2: 97%  Weight: 174 lb 12.8 oz (79.3 kg)  Height: 5\' 7"  (1.702 m)   97% on RA BMI Readings from  Last 3 Encounters:  09/25/21 27.38 kg/m  09/15/21 27.25 kg/m  08/13/21 27.57 kg/m   Wt Readings from Last 3 Encounters:  09/25/21 174 lb 12.8 oz (79.3 kg)  09/15/21 174 lb (78.9 kg)  08/13/21 176 lb (79.8 kg)     CBC    Component Value Date/Time   WBC 7.4 09/15/2021 0941   RBC 4.66 09/15/2021 0941   HGB 13.9 09/15/2021 0941   HCT 41.9 09/15/2021 0941   PLT 197 09/15/2021 0941   MCV 89.9 09/15/2021 0941   MCH 29.8 09/15/2021 0941   MCHC 33.2 09/15/2021 0941   RDW 13.3 09/15/2021 0941   LYMPHSABS 0.8  09/08/2017 0935   MONOABS 0.6 09/08/2017 0935   EOSABS 0.2 09/08/2017 0935   BASOSABS 0.0 09/08/2017 0935     Chest Imaging: 07/01/2021 lung cancer screening CT: Abnormal left lower lobe nodule.  This has been present since January 2022.  There has been some slow evolution of the nodule. But has remained persistent. The patient's images have been independently reviewed by me.    Nuclear medicine pet imaging 07/20/2021: Low-level PET uptake. Cannot exclude low-grade malignancy. The patient's images have been independently reviewed by me.    Pulmonary Functions Testing Results: No flowsheet data found.  FeNO:   Pathology:   09/15/2021 bronchoscopy: Pathology negative for malignancy on transbronchial biopsies  Echocardiogram:   Heart Catheterization:     Assessment & Plan:     ICD-10-CM   1. Lung nodule seen on imaging study  R91.1 CT Super D Chest Wo Contrast    2. Former smoker  Z87.891     3. Abnormal CT lung screening  R91.8       Discussion: This is a 71 year old female, left lower lobe pulmonary nodule, has shown some evolution over time.  Taken for bronchoscopy with robotic assisted navigation tissue samples negative for malignancy.  Also there is some evidence of emphysema on CT.  Plan: With negative bronchoscopy biopsies I think working into her a period of watchful waiting. Repeat CT scan of the chest in 6 months. Reviewed pathology results today in the office.  Culture no growth to date.   Current Outpatient Medications:    atorvastatin (LIPITOR) 40 MG tablet, Take 1 tablet (40 mg total) by mouth daily. For cholesterol. (Patient taking differently: Take 40 mg by mouth every evening. For cholesterol.), Disp: 90 tablet, Rfl: 3   baclofen (LIORESAL) 10 MG tablet, TAKE 1 TABLET(10 MG) BY MOUTH THREE TIMES DAILY AS NEEDED FOR MUSCLE SPASMS, Disp: 180 tablet, Rfl: 3   Biotin 5000 MCG CAPS, Take 5,000 mcg by mouth every morning. , Disp: , Rfl:    celecoxib  (CELEBREX) 200 MG capsule, TAKE 1 CAPSULE(200 MG) BY MOUTH ONCE TO TWICE DAILY AS NEEDED FOR MODERATE PAIN (Patient taking differently: Take 200 mg by mouth every evening. TAKE 1 CAPSULE(200 MG) BY MOUTH ONCE TO TWICE DAILY AS NEEDED FOR MODERATE PAIN), Disp: 180 capsule, Rfl: 0   cetirizine (ZYRTEC) 10 MG tablet, Take 1 tablet (10 mg total) by mouth daily. For allergies (Patient taking differently: Take 10 mg by mouth every evening. For allergies), Disp: 90 tablet, Rfl: 0   cholecalciferol (VITAMIN D) 1000 units tablet, Take 2 tablets (2,000 Units total) by mouth daily after lunch. (Patient taking differently: Take 1,000 Units by mouth daily after lunch.), Disp: 30 tablet, Rfl: 0   Coenzyme Q10 (COQ10 PO), Take 120 mg by mouth every evening., Disp: , Rfl:    esomeprazole (NEXIUM 24HR) 20  MG capsule, Take 1 capsule (20 mg total) by mouth daily at 12 noon., Disp: 90 capsule, Rfl: 1   fluticasone (FLONASE) 50 MCG/ACT nasal spray, SHAKE LIQUID AND USE 1 SPRAY IN EACH NOSTRIL TWICE DAILY (Patient taking differently: 1 spray 2 (two) times daily as needed for allergies.), Disp: 48 g, Rfl: 1   gabapentin (NEURONTIN) 300 MG capsule, Take 1 capsule (300 mg total) by mouth 2 (two) times daily. For pain. (Patient taking differently: Take 300 mg by mouth in the morning, at noon, and at bedtime. For pain.), Disp: 180 capsule, Rfl: 3   Garlic 616 MG CAPS, Take 500 mg 2 (two) times daily by mouth., Disp: , Rfl:    ibuprofen (ADVIL) 200 MG tablet, Take 400-600 mg by mouth every 8 (eight) hours as needed (pain)., Disp: , Rfl:    lisinopril (ZESTRIL) 20 MG tablet, Take 1 tablet (20 mg total) by mouth daily. For blood pressure. (Patient taking differently: Take 40 mg by mouth daily. For blood pressure.), Disp: 90 tablet, Rfl: 3   metFORMIN (GLUCOPHAGE) 500 MG tablet, TAKE 1 TABLET BY MOUTH TWICE DAILY for diabetes., Disp: 180 tablet, Rfl: 3   mupirocin cream (BACTROBAN) 2 %, Apply 1 application topically 2 (two) times  daily., Disp: 15 g, Rfl: 0   oxybutynin (DITROPAN-XL) 5 MG 24 hr tablet, TAKE 1 TABLET(5 MG) BY MOUTH AT BEDTIME FOR OVERACTIVE BLADDER, Disp: 90 tablet, Rfl: 2   Polyethyl Glycol-Propyl Glycol (LUBRICANT EYE DROPS) 0.4-0.3 % SOLN, Place 1-2 drops into both eyes 3 (three) times daily as needed (dry/irritated eyes.)., Disp: , Rfl:    senna-docusate (SENOKOT-S) 8.6-50 MG tablet, Take 2 tablets by mouth 2 (two) times daily. (Patient taking differently: Take 1 tablet by mouth every evening.), Disp: , Rfl:    Garner Nash, DO [facility] 09/25/2021 2:53 PM

## 2021-09-25 NOTE — Patient Instructions (Signed)
Thank you for visiting Dr. Valeta Harms at [facility]. Today we recommend the following:  Orders Placed This Encounter  Procedures   CT Super D Chest Wo Contrast   Follow up after repeat ct chest.   Return in about 6 months (around 03/25/2022) for with Eric Form, NP, or Dr. Valeta Harms.    Please do your part to reduce the spread of COVID-19.

## 2021-10-06 ENCOUNTER — Other Ambulatory Visit (INDEPENDENT_AMBULATORY_CARE_PROVIDER_SITE_OTHER): Payer: PPO

## 2021-10-06 ENCOUNTER — Other Ambulatory Visit: Payer: Self-pay

## 2021-10-06 ENCOUNTER — Telehealth: Payer: Self-pay | Admitting: Primary Care

## 2021-10-06 DIAGNOSIS — E785 Hyperlipidemia, unspecified: Secondary | ICD-10-CM | POA: Diagnosis not present

## 2021-10-06 LAB — CULTURE, FUNGUS WITHOUT SMEAR

## 2021-10-06 LAB — LIPID PANEL
Cholesterol: 134 mg/dL (ref 0–200)
HDL: 37.8 mg/dL — ABNORMAL LOW (ref >39.00)
LDL Cholesterol: 57 mg/dL (ref 0–99)
NonHDL: 95.8
Total CHOL/HDL Ratio: 4
Triglycerides: 196 mg/dL — ABNORMAL HIGH (ref 0.0–149.0)
VLDL: 39.2 mg/dL (ref 0.0–40.0)

## 2021-10-06 NOTE — Telephone Encounter (Signed)
Placed in your box for review.

## 2021-10-06 NOTE — Telephone Encounter (Signed)
Called patient per her request I have left form at reception. Husband will come by this week to pick up.

## 2021-10-06 NOTE — Telephone Encounter (Signed)
Paperwork dropped off  Type of forms received:disability license  Routed to: [employer] received by : terrill   Individual made aware of 3-5 business day turn around (Y/N): y Form completed and patient made aware of charges(Y/N): y  Faxed to :   Form location:  put in kate box

## 2021-10-06 NOTE — Telephone Encounter (Signed)
Signed and placed in Joellen's inbox.

## 2021-10-29 LAB — ACID FAST CULTURE WITH REFLEXED SENSITIVITIES (MYCOBACTERIA): Acid Fast Culture: NEGATIVE

## 2021-11-23 ENCOUNTER — Other Ambulatory Visit: Payer: Self-pay | Admitting: Primary Care

## 2021-11-23 DIAGNOSIS — R921 Mammographic calcification found on diagnostic imaging of breast: Secondary | ICD-10-CM

## 2021-12-29 ENCOUNTER — Telehealth: Payer: Self-pay | Admitting: Primary Care

## 2021-12-29 DIAGNOSIS — K130 Diseases of lips: Secondary | ICD-10-CM

## 2021-12-29 MED ORDER — MUPIROCIN CALCIUM 2 % EX CREA: 1.0000 "application " | TOPICAL_CREAM | Freq: Two times a day (BID) | CUTANEOUS | 0 refills | Status: DC

## 2021-12-29 NOTE — Telephone Encounter (Signed)
?  Encourage patient to contact the pharmacy for refills or they can request refills through [facility] ? ?LAST APPOINTMENT DATE:  Please schedule appointment if longer than 1 year ? ?NEXT APPOINTMENT DATE: ? ?MEDICATION:mupirocin cream (BACTROBAN) 2 % ? ?Is the patient out of medication?  ? ?PHARMACY:[id number] - [place], [place] ? ?Let patient know to contact pharmacy at the end of the day to make sure medication is ready. ? ?Please notify patient to allow 48-72 hours to process ? ?CLINICAL FILLS OUT ALL BELOW:  ? ?LAST REFILL: ? ?QTY: ? ?REFILL DATE: ? ? ? ?OTHER COMMENTS:  ? ? ?Okay for refill? ? ?Please advise ? ? ?  ?

## 2021-12-29 NOTE — Telephone Encounter (Signed)
Refills sent to pharmacy. 

## 2022-01-01 ENCOUNTER — Ambulatory Visit (INDEPENDENT_AMBULATORY_CARE_PROVIDER_SITE_OTHER): Payer: PPO

## 2022-01-01 DIAGNOSIS — Z0000 Encounter for general adult medical examination without abnormal findings: Secondary | ICD-10-CM

## 2022-01-01 NOTE — Progress Notes (Signed)
? ?Subjective:  ? Megan [name] is a 71 y.o. female who presents for Medicare Annual (Subsequent) preventive examination. ? ? ?I connected with Drenda Freeze today by telephone and verified that I am speaking with the correct person using two identifiers. ?Location patient: home ?Location provider: work ?Persons participating in the virtual visit: patient, provider. ?  ?I discussed the limitations, risks, security and privacy concerns of performing an evaluation and management service by telephone and the availability of in person appointments. I also discussed with the patient that there may be a patient responsible charge related to this service. The patient expressed understanding and verbally consented to this telephonic visit.  ?  ?Interactive audio and video telecommunications were attempted between this provider and patient, however failed, due to patient having technical difficulties OR patient did not have access to video capability.  We continued and completed visit with audio only. ? ?  ?Review of Systems    ? ?Cardiac Risk Factors include: advanced age (>77men, >27 women);diabetes mellitus ? ?   ?Objective:  ?  ?Today's Vitals  ? ?There is no height or weight on file to calculate BMI. ? ? ?  01/01/2022  ?  1:07 PM 09/15/2021  ?  9:30 AM 12/31/2020  ?  1:16 PM 12/27/2019  ? 11:18 AM 05/23/2018  ?  3:35 PM 09/07/2017  ? 11:42 PM 08/29/2017  ?  8:35 AM  ?Advanced Directives  ?Does Patient Have a Medical Advance Directive? No No No No No No No  ?Would patient like information on creating a medical advance directive? No - Patient declined No - Patient declined No - Patient declined No - Patient declined No - Patient declined No - Patient declined No - Patient declined  ? ? ?Current Medications (verified) ?Outpatient Encounter Medications as of 01/01/2022  ?Medication Sig  ? atorvastatin (LIPITOR) 40 MG tablet Take 1 tablet (40 mg total) by mouth daily. For cholesterol. (Patient taking differently: Take 40 mg by  mouth every evening. For cholesterol.)  ? baclofen (LIORESAL) 10 MG tablet TAKE 1 TABLET(10 MG) BY MOUTH THREE TIMES DAILY AS NEEDED FOR MUSCLE SPASMS  ? Biotin 5000 MCG CAPS Take 5,000 mcg by mouth every morning.   ? celecoxib (CELEBREX) 200 MG capsule TAKE 1 CAPSULE(200 MG) BY MOUTH ONCE TO TWICE DAILY AS NEEDED FOR MODERATE PAIN (Patient taking differently: Take 200 mg by mouth every evening. TAKE 1 CAPSULE(200 MG) BY MOUTH ONCE TO TWICE DAILY AS NEEDED FOR MODERATE PAIN)  ? cetirizine (ZYRTEC) 10 MG tablet Take 1 tablet (10 mg total) by mouth daily. For allergies (Patient taking differently: Take 10 mg by mouth every evening. For allergies)  ? cholecalciferol (VITAMIN D) 1000 units tablet Take 2 tablets (2,000 Units total) by mouth daily after lunch. (Patient taking differently: Take 1,000 Units by mouth daily after lunch.)  ? Coenzyme Q10 (COQ10 PO) Take 120 mg by mouth every evening.  ? esomeprazole (NEXIUM 24HR) 20 MG capsule Take 1 capsule (20 mg total) by mouth daily at 12 noon.  ? fluticasone (FLONASE) 50 MCG/ACT nasal spray SHAKE LIQUID AND USE 1 SPRAY IN EACH NOSTRIL TWICE DAILY (Patient taking differently: 1 spray 2 (two) times daily as needed for allergies.)  ? gabapentin (NEURONTIN) 300 MG capsule Take 1 capsule (300 mg total) by mouth 2 (two) times daily. For pain. (Patient taking differently: Take 300 mg by mouth in the morning, at noon, and at bedtime. For pain.)  ? Garlic 119 MG CAPS Take 500 mg 2 (  two) times daily by mouth.  ? ibuprofen (ADVIL) 200 MG tablet Take 400-600 mg by mouth every 8 (eight) hours as needed (pain).  ? lisinopril (ZESTRIL) 20 MG tablet Take 1 tablet (20 mg total) by mouth daily. For blood pressure. (Patient taking differently: Take 40 mg by mouth daily. For blood pressure.)  ? metFORMIN (GLUCOPHAGE) 500 MG tablet TAKE 1 TABLET BY MOUTH TWICE DAILY for diabetes.  ? mupirocin cream (BACTROBAN) 2 % Apply 1 application. topically 2 (two) times daily.  ? oxybutynin (DITROPAN-XL)  5 MG 24 hr tablet TAKE 1 TABLET(5 MG) BY MOUTH AT BEDTIME FOR OVERACTIVE BLADDER  ? Polyethyl Glycol-Propyl Glycol (LUBRICANT EYE DROPS) 0.4-0.3 % SOLN Place 1-2 drops into both eyes 3 (three) times daily as needed (dry/irritated eyes.).  ? senna-docusate (SENOKOT-S) 8.6-50 MG tablet Take 2 tablets by mouth 2 (two) times daily. (Patient taking differently: Take 1 tablet by mouth every evening.)  ? ?No facility-administered encounter medications on file as of 01/01/2022.  ? ? ?Allergies (verified) ?Shellfish allergy, Oxycodone, and Penicillins  ? ?History: ?Past Medical History:  ?Diagnosis Date  ? Acute cystitis with hematuria 05/07/2021  ? Arthralgia of left hand 07/17/2018  ? Arthritis   ? "hands, back" (07/26/2017)  ? 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 Constipation due to pain medication   ? Debility   ? Dyspnea   ? W/ PHYS CONDITION   ? Emphysema of lung ([place])   ? Foul smelling urine 07/22/2020  ? GERD (gastroesophageal reflux disease)   ? History of bronchitis   ? "not since I quit smoking" (07/26/2017)  ? History of kidney stones   ? History of shingles   ? Hyperlipidemia   ? takes Fish Oil daily  ? Hypertension   ? Hypoalbuminemia due to protein-calorie malnutrition ([place])   ? L3 vertebral fracture ([place]) 09/01/2017  ? Myelopathy ([place]) 12/21/2016  ? Neuromuscular disorder ([place])   ? tingling toes  ? Paraparesis ([place]) 12/15/2016  ? 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 Pneumonia 2009  ? PONV (postoperative nausea and vomiting)   ? Radiculopathy 07/28/2017  ? Restless leg   ? 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  ? Type 2 diabetes mellitus ([place])   ? Uterine cancer ([place]) 1979  ? S/P hysterectomy  ? Weakness   ? numbness and tingling in both feet r/t back  ? ?Past Surgical History:  ?Procedure Laterality Date  ? ABDOMINAL EXPOSURE N/A 07/26/2017  ? Procedure: ABDOMINAL EXPOSURE;  Surgeon: Angelia Mould, MD;  Location: [facility];  Service: Vascular;  Laterality: N/A;  ? ANTERIOR CERVICAL DECOMP/DISCECTOMY FUSION  2001  ? ANTERIOR LUMBAR FUSION N/A 07/26/2017  ? Procedure: Lumbar five-Sacral one Anterior lumbar interbody fusion with Dr. Deitra Mayo for approach;  Surgeon: Ditty, Kevan Ny, MD;  Location: [facility];  Service: Neurosurgery;  Laterality: N/A;  ? APPLICATION OF ROBOTIC ASSISTANCE FOR SPINAL PROCEDURE  12/19/2016  ? Procedure: APPLICATION OF ROBOTIC ASSISTANCE FOR SPINAL PROCEDURE;  Surgeon: Kevan Ny Ditty, MD;  Location: [facility];  Service: Neurosurgery;;  ? APPLICATION OF ROBOTIC ASSISTANCE FOR SPINAL PROCEDURE N/A 07/26/2017  ? Procedure: APPLICATION OF ROBOTIC ASSISTANCE FOR SPINAL PROCEDURE;  Surgeon: Ditty, Kevan Ny, MD;  Location: [facility];  Service: Neurosurgery;  Laterality: N/A;  ? APPLICATION OF ROBOTIC ASSISTANCE FOR SPINAL PROCEDURE N/A 09/02/2017  ? Procedure: APPLICATION OF ROBOTIC ASSISTANCE FOR SPINAL PROCEDURE;  Surgeon: Ditty, Kevan Ny, MD;  Location:  [facility];  Service: Neurosurgery;  Laterality: N/A;  ? BACK SURGERY    ? BLADDER SUSPENSION  1991  ? tack  ? BRONCHIAL BIOPSY  09/15/2021  ? Procedure: BRONCHIAL BIOPSIES;  Surgeon: Garner Nash, DO;  Location: [facility];  Service: Pulmonary;;  ? BRONCHIAL BRUSHINGS  09/15/2021  ? Procedure: BRONCHIAL BRUSHINGS;  Surgeon: Garner Nash, DO;  Location: [facility];  Service: Pulmonary;;  ? BRONCHIAL NEEDLE ASPIRATION BIOPSY  09/15/2021  ? Procedure: BRONCHIAL NEEDLE ASPIRATION BIOPSIES;  Surgeon: Garner Nash, DO;  Location: [facility];  Service: Pulmonary;;  ? BRONCHIAL WASHINGS  09/15/2021  ? Procedure: BRONCHIAL WASHINGS;  Surgeon: Garner Nash, DO;  Location: [facility];  Service: Pulmonary;;  ? FIDUCIAL MARKER PLACEMENT  09/15/2021  ? Procedure: FIDUCIAL MARKER PLACEMENT;  Surgeon: Garner Nash, DO;  Location: [facility];  Service: Pulmonary;;  ? INGUINAL HERNIA REPAIR Right 1991  ? LUMBAR  FUSION  2016; 2017; 07/26/2017  ? L4-5; L2-3; L5-S1  ? POSTERIOR LUMBAR FUSION 4 WITH HARDWARE REMOVAL N/A 09/02/2017  ? Procedure: Lumbar three-four redo laminectomy; Repositioning of Left Sacral two screw; Ex

## 2022-01-01 NOTE — Patient Instructions (Signed)
Ms. Ebbert , ?Thank you for taking time to come for your Medicare Wellness Visit. I appreciate your ongoing commitment to your health goals. Please review the following plan we discussed and let me know if I can assist you in the future.  ? ?Screening recommendations/referrals: ?Colonoscopy: patient declines  ?Mammogram: scheduled 01/08/2022 ?Bone Density: 01/02/2020 ?Recommended yearly ophthalmology/optometry visit for glaucoma screening and checkup ?Recommended yearly dental visit for hygiene and checkup ? ?Vaccinations: ?Influenza vaccine: completed  ?Pneumococcal vaccine: completed ?Tdap vaccine: 04/07/2015 ?Shingles vaccine: completed    ? ?Advanced directives: none  ? ?Conditions/risks identified: none  ? ?Next appointment: none  ? ? ?Preventive Care 69 Years and Older, Female ?Preventive care refers to lifestyle choices and visits with your health care provider that can promote health and wellness. ?What does preventive care include? ?A yearly physical exam. This is also called an annual well check. ?Dental exams once or twice a year. ?Routine eye exams. Ask your health care provider how often you should have your eyes checked. ?Personal lifestyle choices, including: ?Daily care of your teeth and gums. ?Regular physical activity. ?Eating a healthy diet. ?Avoiding tobacco and drug use. ?Limiting alcohol use. ?Practicing safe sex. ?Taking low-dose aspirin every day. ?Taking vitamin and mineral supplements as recommended by your health care provider. ?What happens during an annual well check? ?The services and screenings done by your health care provider during your annual well check will depend on your age, overall health, lifestyle risk factors, and family history of disease. ?Counseling  ?Your health care provider may ask you questions about your: ?Alcohol use. ?Tobacco use. ?Drug use. ?Emotional well-being. ?Home and relationship well-being. ?Sexual activity. ?Eating habits. ?History of falls. ?Memory and  ability to understand (cognition). ?Work and work Statistician. ?Reproductive health. ?Screening  ?You may have the following tests or measurements: ?Height, weight, and BMI. ?Blood pressure. ?Lipid and cholesterol levels. These may be checked every 5 years, or more frequently if you are over 69 years old. ?Skin check. ?Lung cancer screening. You may have this screening every year starting at age 102 if you have a 30-pack-year history of smoking and currently smoke or have quit within the past 15 years. ?Fecal occult blood test (FOBT) of the stool. You may have this test every year starting at age 48. ?Flexible sigmoidoscopy or colonoscopy. You may have a sigmoidoscopy every 5 years or a colonoscopy every 10 years starting at age 7. ?Hepatitis C blood test. ?Hepatitis B blood test. ?Sexually transmitted disease (STD) testing. ?Diabetes screening. This is done by checking your blood sugar (glucose) after you have not eaten for a while (fasting). You may have this done every 1-3 years. ?Bone density scan. This is done to screen for osteoporosis. You may have this done starting at age 41. ?Mammogram. This may be done every 1-2 years. Talk to your health care provider about how often you should have regular mammograms. ?Talk with your health care provider about your test results, treatment options, and if necessary, the need for more tests. ?Vaccines  ?Your health care provider may recommend certain vaccines, such as: ?Influenza vaccine. This is recommended every year. ?Tetanus, diphtheria, and acellular pertussis (Tdap, Td) vaccine. You may need a Td booster every 10 years. ?Zoster vaccine. You may need this after age 43. ?Pneumococcal 13-valent conjugate (PCV13) vaccine. One dose is recommended after age 54. ?Pneumococcal polysaccharide (PPSV23) vaccine. One dose is recommended after age 35. ?Talk to your health care provider about which screenings and vaccines you need and how  often you need them. ?This information is  not intended to replace advice given to you by your health care provider. Make sure you discuss any questions you have with your health care provider. ?Document Released: 09/26/2015 Document Revised: 05/19/2016 Document Reviewed: 07/01/2015 ?Elsevier Interactive Patient Education ? 2017 [place]. ? ?Fall Prevention in the Home ?Falls can cause injuries. They can happen to people of all ages. There are many things you can do to make your home safe and to help prevent falls. ?What can I do on the outside of my home? ?Regularly fix the edges of walkways and driveways and fix any cracks. ?Remove anything that might make you trip as you walk through a door, such as a raised step or threshold. ?Trim any bushes or trees on the path to your home. ?Use bright outdoor lighting. ?Clear any walking paths of anything that might make someone trip, such as rocks or tools. ?Regularly check to see if handrails are loose or broken. Make sure that both sides of any steps have handrails. ?Any raised decks and porches should have guardrails on the edges. ?Have any leaves, snow, or ice cleared regularly. ?Use sand or salt on walking paths during winter. ?Clean up any spills in your garage right away. This includes oil or grease spills. ?What can I do in the bathroom? ?Use night lights. ?Install grab bars by the toilet and in the tub and shower. Do not use towel bars as grab bars. ?Use non-skid mats or decals in the tub or shower. ?If you need to sit down in the shower, use a plastic, non-slip stool. ?Keep the floor dry. Clean up any water that spills on the floor as soon as it happens. ?Remove soap buildup in the tub or shower regularly. ?Attach bath mats securely with double-sided non-slip rug tape. ?Do not have throw rugs and other things on the floor that can make you trip. ?What can I do in the bedroom? ?Use night lights. ?Make sure that you have a light by your bed that is easy to reach. ?Do not use any sheets or blankets that  are too big for your bed. They should not hang down onto the floor. ?Have a firm chair that has side arms. You can use this for support while you get dressed. ?Do not have throw rugs and other things on the floor that can make you trip. ?What can I do in the kitchen? ?Clean up any spills right away. ?Avoid walking on wet floors. ?Keep items that you use a lot in easy-to-reach places. ?If you need to reach something above you, use a strong step stool that has a grab bar. ?Keep electrical cords out of the way. ?Do not use floor polish or wax that makes floors slippery. If you must use wax, use non-skid floor wax. ?Do not have throw rugs and other things on the floor that can make you trip. ?What can I do with my stairs? ?Do not leave any items on the stairs. ?Make sure that there are handrails on both sides of the stairs and use them. Fix handrails that are broken or loose. Make sure that handrails are as long as the stairways. ?Check any carpeting to make sure that it is firmly attached to the stairs. Fix any carpet that is loose or worn. ?Avoid having throw rugs at the top or bottom of the stairs. If you do have throw rugs, attach them to the floor with carpet tape. ?Make sure that you have a  light switch at the top of the stairs and the bottom of the stairs. If you do not have them, ask someone to add them for you. ?What else can I do to help prevent falls? ?Wear shoes that: ?Do not have high heels. ?Have rubber bottoms. ?Are comfortable and fit you well. ?Are closed at the toe. Do not wear sandals. ?If you use a stepladder: ?Make sure that it is fully opened. Do not climb a closed stepladder. ?Make sure that both sides of the stepladder are locked into place. ?Ask someone to hold it for you, if possible. ?Clearly mark and make sure that you can see: ?Any grab bars or handrails. ?First and last steps. ?Where the edge of each step is. ?Use tools that help you move around (mobility aids) if they are needed. These  include: ?Canes. ?Walkers. ?Scooters. ?Crutches. ?Turn on the lights when you go into a dark area. Replace any light bulbs as soon as they burn out. ?Set up your furniture so you have a clear path. Avoid

## 2022-01-08 ENCOUNTER — Ambulatory Visit
Admission: RE | Admit: 2022-01-08 | Discharge: 2022-01-08 | Disposition: A | Discharge status: Home / Self Care | Payer: PPO | Source: Ambulatory Visit | Attending: Primary Care | Admitting: Primary Care

## 2022-01-08 DIAGNOSIS — R921 Mammographic calcification found on diagnostic imaging of breast: Secondary | ICD-10-CM | POA: Diagnosis not present

## 2022-01-29 ENCOUNTER — Ambulatory Visit (INDEPENDENT_AMBULATORY_CARE_PROVIDER_SITE_OTHER): Payer: PPO | Admitting: Primary Care

## 2022-01-29 ENCOUNTER — Encounter: Payer: Self-pay | Admitting: Primary Care

## 2022-01-29 VITALS — BP 136/82 | HR 75 | Temp 98.0°F | Ht 67.0 in | Wt 171.0 lb

## 2022-01-29 DIAGNOSIS — E1165 Type 2 diabetes mellitus with hyperglycemia: Secondary | ICD-10-CM

## 2022-01-29 DIAGNOSIS — B351 Tinea unguium: Secondary | ICD-10-CM | POA: Diagnosis not present

## 2022-01-29 LAB — POCT GLYCOSYLATED HEMOGLOBIN (HGB A1C): Hemoglobin A1C: 6.9 % — AB (ref 4.0–5.6)

## 2022-01-29 MED ORDER — TERBINAFINE HCL 250 MG PO TABS: 250.0000 mg | ORAL_TABLET | Freq: Every day | ORAL | 0 refills | Status: DC

## 2022-01-29 NOTE — Assessment & Plan Note (Signed)
Improved with A1C of 6.9 today.  Continue metformin 500 mg BID. Managed on statin and ACE-I. Pneumonia vaccine UTD. Eye exam due soon, discussed today.  Foot exam today.  Follow up in 6 months.

## 2022-01-29 NOTE — Assessment & Plan Note (Signed)
Evident to bilateral toenails.  From exam, I do not believe that OTC treatment will resolve her issue. Liver enzymes reviewed from November 2022.  Rx for terbinafine 250 mg daily x 30 days.  She will update at that point.

## 2022-01-29 NOTE — Patient Instructions (Signed)
Start terbinafine 250 mg for toenail fungus. Take 1 tablet by mouth once daily.  Please update me in 1 month.   Please schedule a physical to meet with me in 6 months.   It was a pleasure to see you today!

## 2022-01-29 NOTE — Progress Notes (Signed)
Subjective:    Patient ID: Megan [name], female    DOB: September 21, 1950, 71 y.o.   MRN: [id number]  HPI  Megan [name] is a very pleasant 71 y.o. female with a medical history including type 2 diabetes with peripheral neuropathy, lumbar sacral spondylosis with radiculopathy, hypertension, hyperlipidemia, who presents today for follow-up of diabetes. She would also like to discuss toenail fungus.   1) Type 2 Diabetes:   Current medications include: Metformin 500 mg twice daily  She is checking her blood glucose 1 times daily and is getting readings of:  AM fasting: 120's-130's   Last A1C: 7.2 in November 2022, 6.9 today Last Eye Exam: Up-to-date, due next month Last Foot Exam: Due Pneumonia Vaccination: 2022 Urine Microalbumin: Lisinopril Statin: Atorvastatin  Dietary changes since last visit: overall healthy diet. Smaller portion sizes.    Exercise: None.   BP Readings from Last 3 Encounters:  01/29/22 136/82  09/25/21 (!) 152/80  09/15/21 (!) 155/74   2) Onychomycosis: Chronic to several of the toenails for the last 6+ months. She's tried OTC treatment about one month ago, didn't take consistently. She is requesting treatment, has heard good things about terbinafine.    Review of Systems  Eyes:  Negative for visual disturbance.  Respiratory:  Negative for shortness of breath.   Cardiovascular:  Negative for chest pain.  Skin:        Thickened, yellow toenails  Neurological:  Positive for numbness. Negative for dizziness.        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   Constipation due to pain medication    Debility    Dyspnea    W/ PHYS CONDITION    Emphysema of lung ([place])    Foul smelling urine 07/22/2020   GERD (gastroesophageal reflux disease)    History of bronchitis     "not since I quit smoking" (07/26/2017)   History of kidney stones    History of shingles    Hyperlipidemia    takes Fish Oil daily   Hypertension    Hypoalbuminemia due to protein-calorie malnutrition ([place])    L3 vertebral fracture ([place]) 09/01/2017   Myelopathy ([place]) 12/21/2016   Neuromuscular disorder ([facility])    tingling toes   Paraparesis ([place]) 12/15/2016   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[place]) 12/15/2016   Pneumonia 2009   PONV (postoperative nausea and vomiting)    Radiculopathy 07/28/2017   Restless leg    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   Type 2 diabetes mellitus ([place])    Uterine cancer ([place]) 1979   S/P hysterectomy   Weakness    numbness and tingling in both feet r/t back    Social History   Socioeconomic History   Marital status: Married    Spouse name: [name]   Number of children: 1   Years of education: 12   Highest education level: Not on file  Occupational History   Not on file  Tobacco Use   Smoking status: Former    Packs/day: 1.50    Years: 35.00    Pack years: 52.50    Types: Cigarettes    Quit date: 05/13/2006    Years since quitting: 15.7   Smokeless tobacco: Never  Vaping Use   Vaping Use: Never used  Substance and Sexual Activity   Alcohol use: Yes    Comment: occasional   Drug use: No   Sexual activity: Not Currently    Birth control/protection: Surgical  Other Topics Concern   Not on file  Social History Narrative   Lives w/ husband   Married.   1 child, 1 grandchildren.   Retired. Once worked for [employer].   Enjoys reading.    Social Determinants of Health   Financial Resource Strain: Low Risk    Difficulty of Paying Living Expenses: Not hard at all  Food Insecurity: No Food Insecurity   Worried About Charity fundraiser in the Last Year: Never true   [place] in the Last Year: Never true  Transportation Needs: No Transportation Needs   Lack  of Transportation (Medical): No   Lack of Transportation (Non-Medical): No  Physical Activity: Insufficiently Active   Days of Exercise per Week: 2 days   Minutes of Exercise per Session: 20 min  Stress: Not on file  Social Connections: Moderately Isolated   Frequency of Communication with Friends and Family: Twice a week   Frequency of Social Gatherings with Friends and Family: Twice a week   Attends Religious Services: Never   Marine scientist or Organizations: No   Attends Music therapist: Never   Marital Status: Married  Human resources officer Violence: Not At Risk   Fear of Current or Ex-Partner: No   Emotionally Abused: No   Physically Abused: No   Sexually Abused: No    Past Surgical History:  Procedure Laterality Date   ABDOMINAL EXPOSURE N/A 07/26/2017   Procedure: ABDOMINAL EXPOSURE;  Surgeon: Angelia Mould, MD;  Location: [facility];  Service: Vascular;  Laterality: N/A;   ANTERIOR CERVICAL DECOMP/DISCECTOMY FUSION  2001   ANTERIOR LUMBAR FUSION N/A 07/26/2017   Procedure: Lumbar five-Sacral one Anterior lumbar interbody fusion with Dr. Deitra Mayo for approach;  Surgeon: Ditty, Kevan Ny, MD;  Location: [facility];  Service: Neurosurgery;  Laterality: N/A;   APPLICATION OF ROBOTIC ASSISTANCE FOR SPINAL PROCEDURE  12/19/2016   Procedure: APPLICATION OF ROBOTIC ASSISTANCE FOR SPINAL PROCEDURE;  Surgeon: Kevan Ny Ditty, MD;  Location: [facility];  Service: Neurosurgery;;   APPLICATION OF ROBOTIC ASSISTANCE FOR SPINAL PROCEDURE N/A 07/26/2017   Procedure: APPLICATION OF ROBOTIC ASSISTANCE FOR SPINAL PROCEDURE;  Surgeon: Ditty, Kevan Ny, MD;  Location: [facility];  Service: Neurosurgery;  Laterality: N/A;   APPLICATION OF ROBOTIC ASSISTANCE FOR SPINAL PROCEDURE N/A 09/02/2017   Procedure: APPLICATION OF ROBOTIC ASSISTANCE FOR SPINAL PROCEDURE;  Surgeon: Ditty, Kevan Ny, MD;  Location: [facility];  Service: Neurosurgery;  Laterality: N/A;   BACK  SURGERY     BLADDER SUSPENSION  1991   tack   BRONCHIAL BIOPSY  09/15/2021   Procedure: BRONCHIAL BIOPSIES;  Surgeon: Garner Nash, DO;  Location: [facility];  Service: Pulmonary;;   BRONCHIAL BRUSHINGS  09/15/2021   Procedure: BRONCHIAL BRUSHINGS;  Surgeon: Garner Nash, DO;  Location: [facility];  Service: Pulmonary;;   BRONCHIAL NEEDLE ASPIRATION BIOPSY  09/15/2021   Procedure: BRONCHIAL NEEDLE ASPIRATION BIOPSIES;  Surgeon: Garner Nash, DO;  Location: [facility];  Service: Pulmonary;;   BRONCHIAL WASHINGS  09/15/2021   Procedure: BRONCHIAL WASHINGS;  Surgeon: Garner Nash, DO;  Location: [facility];  Service: Pulmonary;;   FIDUCIAL MARKER PLACEMENT  09/15/2021   Procedure: FIDUCIAL MARKER PLACEMENT;  Surgeon: Garner Nash, DO;  Location: [facility];  Service: Pulmonary;;  INGUINAL HERNIA REPAIR Right 1991   LUMBAR FUSION  2016; 2017; 07/26/2017   L4-5; L2-3; L5-S1   POSTERIOR LUMBAR FUSION 4 WITH HARDWARE REMOVAL N/A 09/02/2017   Procedure: Lumbar three-four redo laminectomy; Repositioning of Left Sacral two screw; Extension of lumbar fusion to Thoracic twelve;  Surgeon: Ditty, Kevan Ny, MD;  Location: [facility];  Service: Neurosurgery;  Laterality: N/A;   TONSILLECTOMY AND ADENOIDECTOMY  1959   TUMOR EXCISION     WERTHIN'S TUMORS BOTH SIDES OF NECK   VAGINAL HYSTERECTOMY  1979   VIDEO BRONCHOSCOPY WITH RADIAL ENDOBRONCHIAL ULTRASOUND  09/15/2021   Procedure: VIDEO BRONCHOSCOPY WITH RADIAL ENDOBRONCHIAL ULTRASOUND;  Surgeon: Garner Nash, DO;  Location: [facility];  Service: Pulmonary;;    Family History  Problem Relation Age of Onset   Diabetes Mother    Dementia Mother    Cirrhosis Mother        Non alcoholic   COPD Father    Diabetes Brother     Allergies  Allergen Reactions   Shellfish Allergy Anaphylaxis, Swelling and Other (See Comments)    Tongue swells   Oxycodone Nausea Only   Penicillins Rash and Other (See Comments)    Has patient  had a PCN reaction causing immediate rash, facial/tongue/throat swelling, SOB or lightheadedness with hypotension: Yes Has patient had a PCN reaction causing severe rash involving mucus membranes or skin necrosis: No Has patient had a PCN reaction that required hospitalization No Has patient had a PCN reaction occurring within the last 10 years: No If all of the above answers are "NO", then may proceed with Cephalosporin use.    Current Outpatient Medications on File Prior to Visit  Medication Sig Dispense Refill   atorvastatin (LIPITOR) 40 MG tablet Take 1 tablet (40 mg total) by mouth daily. For cholesterol. (Patient taking differently: Take 40 mg by mouth every evening. For cholesterol.) 90 tablet 3   baclofen (LIORESAL) 10 MG tablet TAKE 1 TABLET(10 MG) BY MOUTH THREE TIMES DAILY AS NEEDED FOR MUSCLE SPASMS 180 tablet 3   Biotin 5000 MCG CAPS Take 5,000 mcg by mouth every morning.      celecoxib (CELEBREX) 200 MG capsule TAKE 1 CAPSULE(200 MG) BY MOUTH ONCE TO TWICE DAILY AS NEEDED FOR MODERATE PAIN (Patient taking differently: Take 200 mg by mouth every evening. TAKE 1 CAPSULE(200 MG) BY MOUTH ONCE TO TWICE DAILY AS NEEDED FOR MODERATE PAIN) 180 capsule 0   cetirizine (ZYRTEC) 10 MG tablet Take 1 tablet (10 mg total) by mouth daily. For allergies (Patient taking differently: Take 10 mg by mouth every evening. For allergies) 90 tablet 0   cholecalciferol (VITAMIN D) 1000 units tablet Take 2 tablets (2,000 Units total) by mouth daily after lunch. (Patient taking differently: Take 1,000 Units by mouth daily after lunch.) 30 tablet 0   Coenzyme Q10 (COQ10 PO) Take 120 mg by mouth every evening.     esomeprazole (NEXIUM 24HR) 20 MG capsule Take 1 capsule (20 mg total) by mouth daily at 12 noon. 90 capsule 1   fluticasone (FLONASE) 50 MCG/ACT nasal spray SHAKE LIQUID AND USE 1 SPRAY IN EACH NOSTRIL TWICE DAILY (Patient taking differently: 1 spray 2 (two) times daily as needed for allergies.) 48 g 1    gabapentin (NEURONTIN) 300 MG capsule Take 1 capsule (300 mg total) by mouth 2 (two) times daily. For pain. (Patient taking differently: Take 300 mg by mouth in the morning, at noon, and at bedtime. For pain.) 180 capsule 3  Garlic 175 MG CAPS Take 500 mg 2 (two) times daily by mouth.     ibuprofen (ADVIL) 200 MG tablet Take 400-600 mg by mouth every 8 (eight) hours as needed (pain).     lisinopril (ZESTRIL) 20 MG tablet Take 1 tablet (20 mg total) by mouth daily. For blood pressure. (Patient taking differently: Take 40 mg by mouth daily. For blood pressure.) 90 tablet 3   metFORMIN (GLUCOPHAGE) 500 MG tablet TAKE 1 TABLET BY MOUTH TWICE DAILY for diabetes. 180 tablet 3   mupirocin cream (BACTROBAN) 2 % Apply 1 application. topically 2 (two) times daily. (Patient taking differently: Apply 1 application. topically 2 (two) times daily. Taking every other day) 15 g 0   oxybutynin (DITROPAN-XL) 5 MG 24 hr tablet TAKE 1 TABLET(5 MG) BY MOUTH AT BEDTIME FOR OVERACTIVE BLADDER 90 tablet 2   Polyethyl Glycol-Propyl Glycol (LUBRICANT EYE DROPS) 0.4-0.3 % SOLN Place 1-2 drops into both eyes 3 (three) times daily as needed (dry/irritated eyes.).     senna-docusate (SENOKOT-S) 8.6-50 MG tablet Take 2 tablets by mouth 2 (two) times daily. (Patient taking differently: Take 1 tablet by mouth every evening.)     No current facility-administered medications on file prior to visit.    BP 136/82   Pulse 75   Temp 98 F (36.7 C) (Oral)   Ht 5\' 7"  (1.702 m)   Wt 171 lb (77.6 kg)   SpO2 97%   BMI 26.78 kg/m  Objective:   Physical Exam Cardiovascular:     Rate and Rhythm: Normal rate and regular rhythm.  Pulmonary:     Effort: Pulmonary effort is normal.     Breath sounds: Normal breath sounds.  Musculoskeletal:     Cervical back: Neck supple.  Skin:    General: Skin is warm and dry.     Comments: Thickened, yellow toenails to bilateral great toes, and several other toes on feet.            Assessment & Plan:      This visit occurred during the SARS-CoV-2 public health emergency.  Safety protocols were in place, including screening questions prior to the visit, additional usage of staff PPE, and extensive cleaning of exam room while observing appropriate contact time as indicated for disinfecting solutions.

## 2022-01-31 ENCOUNTER — Other Ambulatory Visit: Payer: Self-pay | Admitting: Primary Care

## 2022-01-31 DIAGNOSIS — I10 Essential (primary) hypertension: Secondary | ICD-10-CM

## 2022-02-14 ENCOUNTER — Other Ambulatory Visit: Payer: Self-pay | Admitting: Primary Care

## 2022-02-14 DIAGNOSIS — E119 Type 2 diabetes mellitus without complications: Secondary | ICD-10-CM

## 2022-02-14 DIAGNOSIS — M4727 Other spondylosis with radiculopathy, lumbosacral region: Secondary | ICD-10-CM

## 2022-02-14 DIAGNOSIS — G894 Chronic pain syndrome: Secondary | ICD-10-CM

## 2022-02-22 DIAGNOSIS — K130 Diseases of lips: Secondary | ICD-10-CM | POA: Diagnosis not present

## 2022-02-22 DIAGNOSIS — L930 Discoid lupus erythematosus: Secondary | ICD-10-CM | POA: Diagnosis not present

## 2022-02-22 DIAGNOSIS — L309 Dermatitis, unspecified: Secondary | ICD-10-CM | POA: Diagnosis not present

## 2022-03-08 ENCOUNTER — Other Ambulatory Visit: Payer: Self-pay | Admitting: Primary Care

## 2022-03-08 DIAGNOSIS — B351 Tinea unguium: Secondary | ICD-10-CM

## 2022-03-09 DIAGNOSIS — L930 Discoid lupus erythematosus: Secondary | ICD-10-CM | POA: Diagnosis not present

## 2022-03-22 ENCOUNTER — Ambulatory Visit (HOSPITAL_COMMUNITY)
Admission: RE | Admit: 2022-03-22 | Discharge: 2022-03-22 | Disposition: A | Discharge status: Home / Self Care | Payer: PPO | Source: Ambulatory Visit | Attending: Primary Care | Admitting: Primary Care

## 2022-03-22 ENCOUNTER — Encounter (HOSPITAL_COMMUNITY): Payer: Self-pay

## 2022-03-22 DIAGNOSIS — I700 Atherosclerosis of aorta: Secondary | ICD-10-CM | POA: Insufficient documentation

## 2022-03-22 DIAGNOSIS — I2510 Atherosclerotic heart disease of native coronary artery without angina pectoris: Secondary | ICD-10-CM | POA: Insufficient documentation

## 2022-03-22 DIAGNOSIS — R911 Solitary pulmonary nodule: Secondary | ICD-10-CM | POA: Insufficient documentation

## 2022-03-26 ENCOUNTER — Encounter (HOSPITAL_BASED_OUTPATIENT_CLINIC_OR_DEPARTMENT_OTHER): Payer: Self-pay | Admitting: Emergency Medicine

## 2022-03-26 ENCOUNTER — Emergency Department (HOSPITAL_BASED_OUTPATIENT_CLINIC_OR_DEPARTMENT_OTHER): Payer: PPO | Admitting: Radiology

## 2022-03-26 ENCOUNTER — Other Ambulatory Visit: Payer: Self-pay

## 2022-03-26 ENCOUNTER — Telehealth: Payer: Self-pay

## 2022-03-26 ENCOUNTER — Emergency Department (HOSPITAL_BASED_OUTPATIENT_CLINIC_OR_DEPARTMENT_OTHER)
Admission: EM | Admit: 2022-03-26 | Discharge: 2022-03-26 | Disposition: A | Discharge status: Home / Self Care | Payer: PPO | Attending: Emergency Medicine | Admitting: Emergency Medicine

## 2022-03-26 DIAGNOSIS — Y92410 Unspecified street and highway as the place of occurrence of the external cause: Secondary | ICD-10-CM | POA: Insufficient documentation

## 2022-03-26 DIAGNOSIS — Z7984 Long term (current) use of oral hypoglycemic drugs: Secondary | ICD-10-CM | POA: Diagnosis not present

## 2022-03-26 DIAGNOSIS — M79661 Pain in right lower leg: Secondary | ICD-10-CM | POA: Diagnosis not present

## 2022-03-26 DIAGNOSIS — S93401A Sprain of unspecified ligament of right ankle, initial encounter: Secondary | ICD-10-CM | POA: Diagnosis not present

## 2022-03-26 DIAGNOSIS — S99911A Unspecified injury of right ankle, initial encounter: Secondary | ICD-10-CM | POA: Diagnosis present

## 2022-03-26 DIAGNOSIS — M79671 Pain in right foot: Secondary | ICD-10-CM | POA: Diagnosis not present
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
## 2022-03-26 NOTE — Discharge Instructions (Addendum)
Use your walker keep the cam walker on can remove it for showering.  Make an appointment to follow-up with sports medicine information provided above or you can contact any orthopedic surgeon you desire for follow-up.  X-rays showed no bony injuries.  Pain is consistent with ankle sprain.

## 2022-03-26 NOTE — Telephone Encounter (Addendum)
Hayley RN with access nurse said that pt got her rt leg caught in walker and thinks may have broken ankle. Pt ankle is very swollen and pt experiencing discomfort. Pt incomplete paraplegic. No available appts at [facility] and Hayley will advise pt to go to ED for eval and testing. Sending note to Gentry Fitz NP and Lavina Hamman. Will attach access note when available.    [facility] - Client TELEPHONE ADVICE RECORD AccessNurse Patient Name: Megan [name] RVERL Gender: Female DOB: Feb 05, 1951 Age: 71 Y 68 M Return Phone Number: [phone] (Primary), [phone] (Secondary) Address: [place]  [address] Client [name] - Client Client Site [place] - Day Provider Alma Friendly - NP Contact Type Call Who Is Calling Patient / Member / Family / Caregiver Call Type Triage / Clinical Relationship To Patient Self Return Phone Number [phone] (Primary) Chief Complaint Numbness Reason for Call Symptomatic / Request for [name] she has right leg discomfort after getting it caught on her scooter. She also has numbness, but she usually has that symptom an an incomplete parapalegic. Translation No Nurse Assessment Nurse: Wynetta Fines, RN, Hildred Alamin Date/Time Eilene Ghazi Time): 03/26/2022 2:11:43 PM Confirm and document reason for call. If symptomatic, describe symptoms. ---Caller states she has right leg discomfort after getting it caught on her scooter an hour ago. her ankle looks very swollen. She is an incomplete parapalegic. right leg is totally numb (baseline), and she uses a walker to get around the house Does the patient have any new or worsening symptoms? ---Yes Will a triage be completed? ---Yes Related visit to physician within the last 2 weeks? ---No Does the PT have any chronic conditions? (i.e. diabetes, asthma, this includes High risk factors  for pregnancy, etc.) ---Yes List chronic conditions. ---diabetes Is this a behavioral health or substance abuse call? ---No Guidelines Guideline Title Affirmed Question Affirmed Notes Nurse Date/Time (Eastern Time) Ankle and Foot Injury [1] Limp when walking AND [2] due to a twisted ankle or foot Theressa Stamps 03/26/2022 2:14:40 PM PLEASE NOTE: All timestamps contained within this report are represented as [place] Standard Time. CONFIDENTIALTY NOTICE: This fax transmission is intended only for the addressee. It contains information that is legally privileged, confidential or otherwise protected from use or disclosure. If you are not the intended recipient, you are strictly prohibited from reviewing, disclosing, copying using or disseminating any of this information or taking any action in reliance on or regarding this information. If you have received this fax in error, please notify us immediately by telephone so that we can arrange for its return to [place]. Phone: [phone], Toll-Free: [phone], Fax: [phone] Page: 2 of 2 Call Id: [id number] [place]. Time Eilene Ghazi Time) Disposition Final User 03/26/2022 2:18:12 PM See HCP within 4 Hours (or PCP triage) Yes Wynetta Fines, RN, Hildred Alamin Final Disposition 03/26/2022 2:18:12 PM See HCP within 4 Hours (or PCP triage) Yes Wynetta Fines, RN, Hildred Alamin Disposition Overriden: See PCP within 24 Hours Override Reason: Specify reason. (Please document in 'advice recommended' section) Caller Disagree/Comply Comply Caller Understands Yes PreDisposition Go to Urgent Care/Walk-In Clinic Care Advice Given Per Guideline SEE HCP (OR PCP TRIAGE) WITHIN 4 HOURS: * IF OFFICE WILL BE OPEN: You need to be seen within the next 3 or 4 hours. Call your doctor (or NP/PA) now or as soon as the office opens. NO STANDING: * Try not to put any weight on the injured leg. LOCAL COLD: *  Apply cold pack or an ice bag (wrapped in a moist towel). * Do this for 20 minutes out of  every hour until seen. CALL BACK IF: * You become worse CARE ADVICE given per Foot and Ankle Injury (Adult) guideline. Comments User: Geni Bers, RN Date/Time Eilene Ghazi Time): 03/26/2022 2:16:53 PM i upgraded to see HCP within 4 hours due to the patient having a baseline of decreased sensation and the ankle looking like it may be broken with swelling and increased discomfort. User: Geni Bers, RN Date/Time Eilene Ghazi Time): 03/26/2022 2:25:42 PM called backline, there were no openings available today and her primary care provider recommended that she go to the ED instead. User: Geni Bers, RN Date/Time Eilene Ghazi Time): 03/26/2022 2:29:32 PM notified caller of office instructions to go to ED, she verbalized understanding Referrals REFERRED TO PCP OFFIC

## 2022-03-26 NOTE — Telephone Encounter (Signed)
Patient called in stating that she somehow ran over her R foot with her wheelchair, she said that normally that leg is fairly numb and cant feel anything but she said she has been having some bad discomfort in the foot since the incident. Did have patient speak with access.

## 2022-03-26 NOTE — Telephone Encounter (Signed)
Noted.  Agree with nurse triage recommendations.

## 2022-04-02 ENCOUNTER — Encounter: Payer: Self-pay | Admitting: Acute Care

## 2022-04-02 ENCOUNTER — Ambulatory Visit: Payer: PPO | Admitting: Acute Care

## 2022-04-02 VITALS — BP 160/80 | HR 94 | Temp 98.2°F | Ht 67.0 in | Wt 173.4 lb

## 2022-04-02 DIAGNOSIS — I10 Essential (primary) hypertension: Secondary | ICD-10-CM | POA: Diagnosis not present

## 2022-04-02 DIAGNOSIS — Z720 Tobacco use: Secondary | ICD-10-CM

## 2022-04-02 DIAGNOSIS — R911 Solitary pulmonary nodule: Secondary | ICD-10-CM | POA: Diagnosis not present

## 2022-04-02 NOTE — Progress Notes (Signed)
History of Present Illness Megan [name] is a 71 y.o. female with  past medical history of reflux, pneumonia, arthritis.Patient had a abnormal lung cancer screening CT.  She has been enrolled in the program for several years follow with Eric Form, NP.  Last lung cancer screening CT was on 07/01/2021 which was read as a lung RADS 4B.  Patient was found to have a left lower lobe 13.5 mm lung nodule concerning for malignancy.  She is followed by Dr. Valeta Harms and the Lung Cancer Screening Program.    Pt had abnormal LDCT 06/2021. Patient ultimately had a nuclear medicine PET scan that was ordered and completed on 07/20/2021.  The left lower lobe lesion had low-grade/low-level FDG uptake.  Concern for possibility of a low-grade neoplasm due to its low-level FDG uptake. She had biopsy of the LLL  lesion 09/2021 which was negative .Plan was for 6 month follow up CT Chest to re-evaluate.  Repeat CT 03/22/2022  showed that the LLL  Mass like subsolid lesion in the medial left lower lobe measures 6.1 x 3.8 cm with a 3.5 cm solid component (series 7/image 109), previously 4.1 x 2.8 cm with 2.5 cm solid component, significantly increased   04/02/2022 Pt/ presents for follow up of LLL lung lesion, previously biopsied and  PET scanned with low uptake and with  negative cytology. 6 month surveillance Super D Chest shows significant growth over the last 6 months. She had seen the results on My Chart. Plan is to repeat the PET scan and follow up with Dr. Valeta Harms to review to determine need for repeat biopsy vs referral to radiation oncology for treatment without tissue sampling. .   She states she has been doing well. She has not had any significant weight loss or breathing issues. She did fall and she has a sprained right foot which is in a soft cast/boot. Her mobility is limited at baseline as she has peripheral neuropathy's. She says the right  foot is swollen and bruised, but getting better. She is in a wheelchair  today. BP was elevated. She states she has white coat syndrome. I have asked her to follow up with her PCP. She is scheduled for PET scan 04/09/2022. She will follow up with Dr. Valeta Harms after to review the PET and determine need for repeat biopsy based on results.   Test Results: 03/22/2022 CT Super D Chest without contrast Lungs/Pleura: No pneumothorax. No pleural effusion. Masslike subsolid lesion in the medial left lower lobe measures 6.1 x 3.8 cm with a 3.5 cm solid component (series 7/image 109), previously 4.1 x 2.8 cm with 2.5 cm solid component, significantly increased. Tiny 0.1 cm anterior right upper lobe solid pulmonary nodule is stable (series 7/image 79). No new significant pulmonary nodules  IMPRESSION: 1. Masslike subsolid lesion in the medial left lower lobe measures 6.1 x 3.8 cm with a 3.5 cm solid component, increased in size since 07/20/2021 chest CT. Findings remain worrisome for primary bronchogenic adenocarcinoma. Multidisciplinary thoracic oncology consultation suggested. No thoracic adenopathy or other findings suspicious for metastatic disease in the chest. One vessel coronary atherosclerosis. Aortic Atherosclerosis (ICD10-I70.0).  Cytology 09/15/2021 A. LUNG, LLL, BRUSHING:  - No malignant cells identified   B. LUNG, LLL, FINE NEEDLE ASPIRATION:  - No malignant cells identified    07/20/2021 PET IMPRESSION: 1. Low level FDG uptake in the medial left lower lobe lesion of concern on recent lung cancer screening. Although lung bases are partially obscured by breathing motion, this nodule  does appear less confluent than previous exam. As well differentiated or low-grade neoplasm can be poorly FDG avid, close continued follow-up recommended. 2. No other areas of unexpected or suspicious hypermetabolism on today's study. 3.  Aortic Atherosclerois (ICD10-170.0)  Low Dose Screening CT Chest 07/03/2021 Lungs/Pleura: No pleural effusion. No pneumothorax.  Calcified and noncalcified lung nodules are again noted. The solid nodular area within the medial left lung base has increased in size when compared with 12/15/2020 and 09/15/2020. On today's study this has a mean derived diameter of 17.2, image 239/3. Formally this measured 13.5 mm. The remaining calcified and noncalcified nodules are not significantly changed in the interval. Lung-RADS 4B, suspicious. Additional imaging evaluation or consultation with Pulmonology or Thoracic Surgery recommended. Left lower lobe, 13.5 mm, image 239/3. 2. Coronary artery calcifications. 3. Aortic Atherosclerosis (ICD10-I70.         Latest Ref Rng & Units 09/15/2021    9:41 AM 07/31/2021    4:00 PM 01/22/2020    4:11 PM  CBC  WBC 4.0 - 10.5 K/uL 7.4  5.5  5.2   Hemoglobin 12.0 - 15.0 g/dL 13.9  13.0  12.7   Hematocrit 36.0 - 46.0 % 41.9  38.9  37.7   Platelets 150 - 400 K/uL 197  208  204.0        Latest Ref Rng & Units 09/15/2021    9:41 AM 08/04/2021    3:06 PM 07/31/2021    4:00 PM  BMP  Glucose 70 - 99 mg/dL 172   103   BUN 8 - 23 mg/dL 14   13   Creatinine 0.44 - 1.00 mg/dL 0.91   0.84   BUN/Creat Ratio 6 - 22 (calc)   NOT APPLICABLE   Sodium 443 - 145 mmol/L 136   140   Potassium 3.5 - 5.1 mmol/L 3.8  4.8  5.4   Chloride 98 - 111 mmol/L 103   105   CO2 22 - 32 mmol/L 19   27   Calcium 8.9 - 10.3 mg/dL 9.3   9.6     BNP No results found for: "BNP"  ProBNP No results found for: "PROBNP"  PFT No results found for: "FEV1PRE", "FEV1POST", "FVCPRE", "FVCPOST", "TLC", "DLCOUNC", "PREFEV1FVCRT", "PSTFEV1FVCRT"  DG Foot Complete Right  Result Date: 03/26/2022 CLINICAL DATA:  Trauma, pain EXAM: RIGHT FOOT COMPLETE - 3+ VIEW COMPARISON:  None Available. FINDINGS: No fracture or dislocation is seen. There is soft tissue edema in subcutaneous plane. Osteopenia is seen in bony structures. Degenerative changes with bony spurs are seen in first metatarsophalangeal joint. Small plantar spur is  seen in calcaneus. IMPRESSION: No recent fracture or dislocation is seen in right foot. Degenerative changes are noted in first metatarsophalangeal joint. Small plantar spur is seen in calcaneus. Electronically Signed   By: Elmer Picker M.D.   On: 03/26/2022 16:31   DG Tibia/Fibula Right  Result Date: 03/26/2022 CLINICAL DATA:  Trauma, pain EXAM: RIGHT TIBIA AND FIBULA - 2 VIEW COMPARISON:  None Available. FINDINGS: No recent fracture or dislocation is seen. Degenerative changes are noted in right knee with bony spurs in lateral and patellofemoral compartments. There is no effusion in suprapatellar bursa in right knee. There is marked subcutaneous edema in the right lower leg and right ankle, more so over the medial malleolus. There are no opaque foreign bodies. IMPRESSION: No recent fracture or dislocation is seen. Degenerative changes are noted in right knee. There is subcutaneous edema in subcutaneous plane in the right lower leg  and around the right ankle. This may suggest contusion or cellulitis or vascular disease. Electronically Signed   By: Elmer Picker M.D.   On: 03/26/2022 16:30   CT Super D Chest Wo Contrast  Result Date: 03/22/2022 CLINICAL DATA:  Follow-up pulmonary nodule. Bronchoscopic tissue sampling on 09/15/2021 demonstrated no evidence of malignancy. * Tracking Code: BO * EXAM: CT CHEST WITHOUT CONTRAST TECHNIQUE: Multidetector CT imaging of the chest was performed using thin slice collimation for electromagnetic bronchoscopy planning purposes, without intravenous contrast. RADIATION DOSE REDUCTION: This exam was performed according to the departmental dose-optimization program which includes automated exposure control, adjustment of the mA and/or kV according to patient size and/or use of iterative reconstruction technique. COMPARISON:  07/20/2021 chest CT and PET-CT. FINDINGS: Cardiovascular: Normal heart size. No significant pericardial effusion/thickening. Left anterior  descending coronary atherosclerosis. Atherosclerotic nonaneurysmal thoracic aorta. Normal caliber pulmonary arteries. Mediastinum/Nodes: No discrete thyroid nodules. Unremarkable esophagus. No pathologically enlarged axillary, mediastinal or hilar lymph nodes, noting limited sensitivity for the detection of hilar adenopathy on this noncontrast study. Lungs/Pleura: No pneumothorax. No pleural effusion. Masslike subsolid lesion in the medial left lower lobe measures 6.1 x 3.8 cm with a 3.5 cm solid component (series 7/image 109), previously 4.1 x 2.8 cm with 2.5 cm solid component, significantly increased. Tiny 0.1 cm anterior right upper lobe solid pulmonary nodule is stable (series 7/image 79). No new significant pulmonary nodules. Upper abdomen: No acute abnormality. Musculoskeletal: No aggressive appearing focal osseous lesions. Status post ACDF C6-7. Bilateral posterior spinal fusion hardware extending inferiorly from the T9 level into the lumbar spine. Moderate to marked thoracic spondylosis. IMPRESSION: 1. Masslike subsolid lesion in the medial left lower lobe measures 6.1 x 3.8 cm with a 3.5 cm solid component, increased in size since 07/20/2021 chest CT. Findings remain worrisome for primary bronchogenic adenocarcinoma. Multidisciplinary thoracic oncology consultation suggested. 2. No thoracic adenopathy or other findings suspicious for metastatic disease in the chest. 3. One vessel coronary atherosclerosis. 4. Aortic Atherosclerosis (ICD10-I70.0). Electronically Signed   By: Ilona Sorrel M.D.   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   Constipation due to pain medication    Debility    Dyspnea    W/ PHYS CONDITION    Emphysema of lung ([place])    Foul smelling urine 07/22/2020    GERD (gastroesophageal reflux disease)    History of bronchitis    "not since I quit smoking" (07/26/2017)   History of kidney stones    History of shingles    Hyperlipidemia    takes Fish Oil daily   Hypertension    Hypoalbuminemia due to protein-calorie malnutrition ([place])    L3 vertebral fracture ([place]) 09/01/2017   Myelopathy ([place]) 12/21/2016   Neuromuscular disorder ([facility])    tingling toes   Paraparesis ([place]) 12/15/2016   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   Pneumonia 2009   PONV (postoperative nausea and vomiting)    Radiculopathy 07/28/2017   Restless leg    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 Her smoking use included cigarettes. She has a 52.50 pack-year smoking history. She has never used smokeless tobacco. She reports current alcohol use. She reports that she does not use drugs.  Tobacco Cessation: Former smoker with a 52.5 pack year smoking history. Quit 2007   Past surgical hx, Family hx, Social hx all reviewed.  Current Outpatient Medications on File Prior to Visit  Medication Sig   atorvastatin (LIPITOR) 40 MG tablet Take 1 tablet (40 mg total) by mouth daily. For cholesterol. (Patient taking differently: Take 40 mg by mouth every evening. For cholesterol.)   baclofen (LIORESAL) 10 MG tablet TAKE 1 TABLET(10 MG) BY MOUTH THREE TIMES  DAILY AS NEEDED FOR MUSCLE SPASMS   Biotin 5000 MCG CAPS Take 5,000 mcg by mouth every morning.    celecoxib (CELEBREX) 200 MG capsule TAKE 1 CAPSULE(200 MG) BY MOUTH 1 TO 2 TIMES DAILY AS NEEDED FOR MODERATE PAIN   cetirizine (ZYRTEC) 10 MG tablet Take 1 tablet (10 mg total) by mouth daily. For allergies (Patient taking differently: Take 10 mg by mouth every evening. For allergies)   cholecalciferol (VITAMIN D) 1000 units tablet Take 2 tablets (2,000 Units total) by mouth daily after lunch. (Patient taking differently: Take 1,000 Units by mouth daily after lunch.)   Coenzyme Q10 (COQ10 PO) Take 120 mg by mouth every evening.   esomeprazole (NEXIUM 24HR) 20 MG capsule Take 1 capsule (20 mg total) by mouth daily at 12 noon.   fluocinonide cream (LIDEX) 0.05 % SMARTSIG:sparingly Topical Twice Daily PRN   gabapentin (NEURONTIN) 300 MG capsule Take 1 capsule (300 mg total) by mouth 2 (two) times daily. For pain. (Patient taking differently: Take 300 mg by mouth in the morning, at noon, and at bedtime. For pain.)   ibuprofen (ADVIL) 200 MG tablet Take 400-600 mg by mouth every 8 (eight) hours as needed (pain).   lisinopril (ZESTRIL) 20 MG tablet TAKE 1 TABLET(20 MG) BY MOUTH DAILY FOR BLOOD PRESSURE   metFORMIN (GLUCOPHAGE) 500 MG tablet TAKE 1 TABLET BY MOUTH TWICE DAILY FOR DIABETES   oxybutynin (DITROPAN-XL) 5 MG 24 hr tablet TAKE 1 TABLET(5 MG) BY MOUTH AT BEDTIME FOR OVERACTIVE BLADDER   Polyethyl Glycol-Propyl Glycol (LUBRICANT EYE DROPS) 0.4-0.3 % SOLN Place 1-2 drops into both eyes 3 (three) times daily as needed (dry/irritated eyes.).   senna-docusate (SENOKOT-S) 8.6-50 MG tablet Take 2 tablets by mouth 2 (two) times daily. (Patient taking differently: Take 1 tablet by mouth every evening.)   No current facility-administered medications on file prior to visit.     Allergies  Allergen Reactions   Shellfish Allergy Anaphylaxis, Swelling and Other (See Comments)    Tongue swells   Oxycodone  Nausea Only   Penicillins Rash and Other (See Comments)    Has patient had a PCN reaction causing immediate rash, facial/tongue/throat swelling, SOB or lightheadedness with hypotension: Yes Has patient had a PCN reaction causing severe rash involving mucus membranes or skin necrosis: No  Has patient had a PCN reaction that required hospitalization No Has patient had a PCN reaction occurring within the last 10 years: No If all of the above answers are "NO", then may proceed with Cephalosporin use.    Review Of Systems:  Constitutional:   No  weight loss, night sweats,  Fevers, chills, + fatigue, or  lassitude.  HEENT:   No headaches,  Difficulty swallowing,  Tooth/dental problems, or  Sore throat,                No sneezing, itching, ear ache, nasal congestion, post nasal drip,   CV:  No chest pain,  Orthopnea, PND, swelling in lower extremities, anasarca, dizziness, palpitations, syncope.   GI  No heartburn, indigestion, abdominal pain, nausea, vomiting, diarrhea, change in bowel habits, loss of appetite, bloody stools.   Resp: No shortness of breath with exertion or at rest.  No excess mucus, no productive cough,  No non-productive cough,  No coughing up of blood.  No change in color of mucus.  No wheezing.  No chest wall deformity  Skin: no rash or lesions.  GU: no dysuria, change in color of urine, no urgency or frequency.  No flank pain, no hematuria   MS:  No joint pain or swelling.  No decreased range of motion.  No back pain.  Psych:  No change in mood or affect. No depression or anxiety.  No memory loss.   Vital Signs BP (!) 160/80 (BP Location: Left Arm, Patient Position: Sitting, Cuff Size: Normal)   Pulse 94   Temp 98.2 F (36.8 C) (Oral)   Ht 5\' 7"  (1.702 m)   Wt 173 lb 6.4 oz (78.7 kg)   SpO2 97%   BMI 27.16 kg/m   BP was noted to be elevated. Pt. States she has white coat syndrome. I have asked her to follow up with her PCP.   Physical Exam:  General- No  distress,  A&Ox3, pleasant, in wheelchair ENT: No sinus tenderness, TM clear, pale nasal mucosa, no oral exudate,no post nasal drip, no LAN Cardiac: S1, S2, regular rate and rhythm, no murmur Chest: No wheeze/ rales/ dullness; no accessory muscle use, no nasal flaring, no sternal retractions, diminished per bases, Abd.: Soft Non-tender, ND, BS +, Body mass index is 27.16 kg/m. Ext: No clubbing cyanosis, edema, Right foot in a boot/ soft cast for sprain, R>L neuropathies Neuro:  Physically deconditioned at baseline, MAE x 4, A&O x 3, peripheral neuropathies Skin: No rashes, warm and dry, no lesions , Clean dry and intact Psych: normal mood and behavior   Assessment/Plan LLL pulmonary nodule concerning for malignancy in former smoker Previously low grade avidity on PET 50 + pack year smoking history Quit 2007 Plan We will schedule a PET scan to re-evaluate the nodule that has increased in size on the most recent CT chest.  You will get a call to schedule this.  Follow up with Dr. Valeta Harms after PET to review images and discuss plan of care, repeat biopsy vs SBRT without tissue diagnosis as long as no additional spread..  Please contact office for sooner follow up if symptoms do not improve or worsen or seek emergency care   Call if you have any questions or need anything before.  HTN vs white coat syndrome Plan Follow up with PCP for evaluation    I spent 40 minutes dedicated to the care of this patient on the date of this encounter to include pre-visit review of records, face-to-face  time with the patient discussing conditions above, post visit ordering of testing, clinical documentation with the electronic health record, making appropriate referrals as documented, and communicating necessary information to the patient's healthcare team.   Magdalen Spatz, NP 04/02/2022  1:40 PM

## 2022-04-02 NOTE — Patient Instructions (Addendum)
It is good to see you today. We will schedule a PET scan to re-evaluate the nodule that has increased in size on the most recent CT chest.  You will get a call to schedule this.  Follow up with Dr. Valeta Harms after PET to review images and discuss plan of care.  Please contact office for sooner follow up if symptoms do not improve or worsen or seek emergency care   Call if you have any questions or need anything before.

## 2022-04-07 ENCOUNTER — Ambulatory Visit (INDEPENDENT_AMBULATORY_CARE_PROVIDER_SITE_OTHER): Payer: PPO | Admitting: Family Medicine

## 2022-04-07 ENCOUNTER — Encounter: Payer: Self-pay | Admitting: Family Medicine

## 2022-04-07 VITALS — BP 160/80 | HR 81 | Temp 98.0°F | Ht 67.0 in | Wt 175.2 lb

## 2022-04-07 DIAGNOSIS — S93491D Sprain of other ligament of right ankle, subsequent encounter: Secondary | ICD-10-CM

## 2022-04-07 MED ORDER — TERBINAFINE HCL 250 MG PO TABS: 250.0000 mg | ORAL_TABLET | Freq: Every day | ORAL | 1 refills | Status: DC

## 2022-04-07 NOTE — Progress Notes (Signed)
Lakia Gritton T. Naiah Donahoe, MD, [place] [facility] [place], [address]  Phone: [phone]  FAX: [phone]  DANETTA PROM - 71 y.o. female  MRN [id number]  Date of Birth: Sep 23, 1950  Date: 04/07/2022  PCP: Pleas Koch, NP  Referral: Pleas Koch, NP  Chief Complaint  Patient presents with   Ankle Injury    Right-Sprained Ankle-Seen on ED 03/26/22   Subjective:   Megan DIZDAREVIC is a 71 y.o. very pleasant female patient with Body mass index is 27.45 kg/m. who presents with the following:  This is a new consultation with the patient of Dr. Ainsley Spinner who had a ankle sprain and was seen in the ER on March 26, 2022.  Her foot became entrapped in a battery-powered scooter and she subsequently had some pain in the adjacent foot and ankle.  After the initial injury, she has been able to ambulate with a walker.  She was placed in a cam walker boot and made partial nonweightbearing with a walker.  She is a walker at baseline.  She has had some decreased pain and swelling, she never got any bruising.  She does have significant decrease sensation at baseline.  Review of Systems is noted in the HPI, as appropriate  Objective:   BP (!) 160/80   Pulse 81   Temp 98 F (36.7 C) (Oral)   Ht 5\' 7"  (1.702 m)   Wt 175 lb 4 oz (79.5 kg) Comment: with cam walker  SpO2 98%   BMI 27.45 kg/m   GEN: No acute distress; alert,appropriate. PULM: Breathing comfortably in no respiratory distress PSYCH: Normally interactive.   There is some swelling at the lateral ankle.  There is no bruising.  Nontender along the tibia, fibula, at the malleoli, talus, navicular, cuboid, cuneiforms, and the remainder and entirety of the midfoot and forefoot.  She does have some discomfort with palpation of the ATFL and CFL, and none at the deltoid ligament.  Laboratory and Imaging Data:  Assessment and Plan:     ICD-10-CM   1.  Sprain of anterior talofibular ligament, right, subsequent encounter  S93.491D      Classic ATFL and CFL sprain.  Good progress, and continue to wear a boot for 2 weeks and wean out.  Continue to use walker and avoid falls.  Medication Management during today's office visit: Meds ordered this encounter  Medications   terbinafine (LAMISIL) 250 MG tablet    Sig: Take 1 tablet (250 mg total) by mouth daily.    Dispense:  30 tablet    Refill:  1   Medications Discontinued During This Encounter  Medication Reason   cholecalciferol (VITAMIN D) 1000 units tablet Duplicate   celecoxib (CELEBREX) 509 MG capsule Duplicate    Orders placed today for conditions managed today: No orders of the defined types were placed in this encounter.   Follow-up if needed: No follow-ups on file.  [employer] One speech-to-text software was used for transcription in this dictation.  Possible transcriptional errors can occur using Editor, commissioning.   Signed,  Maud Deed. Zymeir Salminen, MD   Outpatient Encounter Medications as of 04/07/2022  Medication Sig   atorvastatin (LIPITOR) 40 MG tablet Take 1 tablet (40 mg total) by mouth daily. For cholesterol.   baclofen (LIORESAL) 10 MG tablet TAKE 1 TABLET(10 MG) BY MOUTH THREE TIMES DAILY AS NEEDED FOR MUSCLE SPASMS   Biotin 5000 MCG CAPS Take 5,000 mcg by  mouth every morning.    celecoxib (CELEBREX) 200 MG capsule Take 200 mg by mouth daily.   cetirizine (ZYRTEC) 10 MG tablet Take 1 tablet (10 mg total) by mouth daily. For allergies   Cholecalciferol (VITAMIN D) 50 MCG (2000 UT) tablet Take 2,000 Units by mouth daily.   Coenzyme Q10 (COQ10 PO) Take 120 mg by mouth every evening.   esomeprazole (NEXIUM 24HR) 20 MG capsule Take 1 capsule (20 mg total) by mouth daily at 12 noon.   fluocinonide cream (LIDEX) 0.05 % SMARTSIG:sparingly Topical Twice Daily PRN   gabapentin (NEURONTIN) 300 MG capsule Take 1 capsule (300 mg total) by mouth 2 (two) times daily. For pain.    ibuprofen (ADVIL) 200 MG tablet Take 400-600 mg by mouth every 8 (eight) hours as needed (pain).   lisinopril (ZESTRIL) 20 MG tablet TAKE 1 TABLET(20 MG) BY MOUTH DAILY FOR BLOOD PRESSURE   metFORMIN (GLUCOPHAGE) 500 MG tablet TAKE 1 TABLET BY MOUTH TWICE DAILY FOR DIABETES   oxybutynin (DITROPAN-XL) 5 MG 24 hr tablet TAKE 1 TABLET(5 MG) BY MOUTH AT BEDTIME FOR OVERACTIVE BLADDER   Polyethyl Glycol-Propyl Glycol (LUBRICANT EYE DROPS) 0.4-0.3 % SOLN Place 1-2 drops into both eyes 3 (three) times daily as needed (dry/irritated eyes.).   senna-docusate (SENOKOT-S) 8.6-50 MG tablet Take 2 tablets by mouth 2 (two) times daily.   terbinafine (LAMISIL) 250 MG tablet Take 1 tablet (250 mg total) by mouth daily.   [DISCONTINUED] celecoxib (CELEBREX) 200 MG capsule TAKE 1 CAPSULE(200 MG) BY MOUTH 1 TO 2 TIMES DAILY AS NEEDED FOR MODERATE PAIN (Patient taking differently: Take 200 mg by mouth daily. TAKE 1 CAPSULE(200 MG) BY MOUTH 1 TO 2 TIMES DAILY AS NEEDED FOR MODERATE PAIN)   [DISCONTINUED] cholecalciferol (VITAMIN D) 1000 units tablet Take 2 tablets (2,000 Units total) by mouth daily after lunch. (Patient taking differently: Take 1,000 Units by mouth daily after lunch.)   No facility-administered encounter medications on file as of 04/07/2022.

## 2022-04-09 ENCOUNTER — Encounter (HOSPITAL_COMMUNITY)
Admission: RE | Admit: 2022-04-09 | Discharge: 2022-04-09 | Disposition: A | Discharge status: Home / Self Care | Payer: PPO | Source: Ambulatory Visit | Attending: Acute Care | Admitting: Acute Care

## 2022-04-09 DIAGNOSIS — R911 Solitary pulmonary nodule: Secondary | ICD-10-CM

## 2022-04-09 LAB — GLUCOSE, CAPILLARY: Glucose-Capillary: 175 mg/dL — ABNORMAL HIGH (ref 70–99)

## 2022-04-09 MED ORDER — FLUDEOXYGLUCOSE F - 18 (FDG) INJECTION
10.0000 | Freq: Once | INTRAVENOUS | Status: AC
  Administered 2022-04-09: 8.77 via INTRAVENOUS

## 2022-04-21 ENCOUNTER — Ambulatory Visit (INDEPENDENT_AMBULATORY_CARE_PROVIDER_SITE_OTHER): Payer: PPO | Admitting: Pulmonary Disease

## 2022-04-21 ENCOUNTER — Encounter: Payer: Self-pay | Admitting: Pulmonary Disease

## 2022-04-21 VITALS — BP 150/80 | HR 80 | Ht 67.0 in | Wt 174.0 lb

## 2022-04-21 DIAGNOSIS — R911 Solitary pulmonary nodule: Secondary | ICD-10-CM

## 2022-04-21 DIAGNOSIS — Z87891 Personal history of nicotine dependence: Secondary | ICD-10-CM | POA: Diagnosis not present

## 2022-04-21 DIAGNOSIS — R942 Abnormal results of pulmonary function studies: Secondary | ICD-10-CM | POA: Diagnosis not present

## 2022-04-21 DIAGNOSIS — R918 Other nonspecific abnormal finding of lung field: Secondary | ICD-10-CM | POA: Insufficient documentation

## 2022-04-21 DIAGNOSIS — Z720 Tobacco use: Secondary | ICD-10-CM | POA: Diagnosis not present

## 2022-04-21 HISTORY — DX: Solitary pulmonary nodule: R91.1

## 2022-04-21 NOTE — Progress Notes (Signed)
Synopsis: Referred in December 2022 for lung nodule by Pleas Koch, NP  Subjective:   PATIENT ID: Megan [name] GENDER: female DOB: 10-20-1950, MRN: [id number]  Chief Complaint  Patient presents with   Follow-up    Follow-up PET scan    This is a 71 year old female, past medical history of reflux, pneumonia, arthritis.Patient had a abnormal lung cancer screening CT.  She has been enrolled in the program for several years follow with Eric Form, NP.  Last lung cancer screening CT was on 07/01/2021 which was read as a lung RADS 4B.  Patient was found to have a left lower lobe 13.5 mm lung nodule concerning for malignancy.  Patient ultimately had a nuclear medicine PET scan that was ordered and completed on 07/20/2021.  The left lower lobe lesion had low-grade/low-level FDG uptake.  Concern for possibility of a low-grade neoplasm due to its low-level FDG uptake.  Patient presents today to discuss next steps regarding abnormal finding on CT scan.  At baseline the patient uses a rolling walker and sometimes a wheelchair for ambulation or mobility.  OV 09/25/2021: This is a 71 year old female, past history of lower lobe lung nodule with a lung RADS 4B.  She was taken for bronchoscopy tissue biopsies were negative for malignancy cultures with no growth to date.  We reviewed this today in the office.  OV 04/21/2022: Here today for follow-up after recent CT imaging.  She had a nuclear medicine PET scan.  Nuclear medicine pet imaging completed on 04/09/2022 this revealed a left lower lobe pulmonary nodule measuring 3.9 x 1.8 the solid component at 16 mm with an SUV max of 5.7 concerning for primary bronchogenic carcinoma.  Patient was referred back to me to discuss repeat biopsy.  At baseline she has difficulty ambulating.  She has weakness in the right leg from a prior spinal surgery.  She also had a recent injury to the right foot from a fall.  She is in a boot.  Using a wheelchair and sometimes use  a motorized scooter at home.    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   Constipation due to pain medication    Debility    Dyspnea    W/ PHYS CONDITION    Emphysema of lung ([place])    Foul smelling urine 07/22/2020   GERD (gastroesophageal reflux disease)    History of bronchitis    "not since I quit smoking" (07/26/2017)   History of kidney stones    History of shingles    Hyperlipidemia    takes Fish Oil daily   Hypertension    Hypoalbuminemia due to protein-calorie malnutrition ([place])    L3 vertebral fracture ([place]) 09/01/2017   Myelopathy ([place]) 12/21/2016   Neuromuscular disorder (HCC)    tingling toes   Paraparesis ([place]) 12/15/2016   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   Pneumonia 2009   PONV (postoperative nausea and vomiting)    Radiculopathy 07/28/2017   Restless leg    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   Type 2 diabetes mellitus ([place])    Uterine cancer ([place]) 1979   S/P hysterectomy   Weakness    numbness and tingling in both feet r/t back     Family History  Problem Relation  Age of Onset   Diabetes Mother    Dementia Mother    Cirrhosis Mother        Non alcoholic   COPD Father    Diabetes Brother      Past Surgical History:  Procedure Laterality Date   ABDOMINAL EXPOSURE N/A 07/26/2017   Procedure: ABDOMINAL EXPOSURE;  Surgeon: Angelia Mould, MD;  Location[facility] OR;  Service: Vascular;  Laterality: N/A;   ANTERIOR CERVICAL DECOMP/DISCECTOMY FUSION  2001   ANTERIOR LUMBAR FUSION N/A 07/26/2017   Procedure: Lumbar five-Sacral one Anterior lumbar interbody fusion with Dr. Deitra Mayo for approach;  Surgeon: Ditty, Kevan Ny, MD;  Location: [place];  Service: Neurosurgery;   Laterality: N/A;   APPLICATION OF ROBOTIC ASSISTANCE FOR SPINAL PROCEDURE  12/19/2016   Procedure: APPLICATION OF ROBOTIC ASSISTANCE FOR SPINAL PROCEDURE;  Surgeon: Kevan Ny Ditty, MD;  Location: [place];  Service: Neurosurgery;;   APPLICATION OF ROBOTIC ASSISTANCE FOR SPINAL PROCEDURE N/A 07/26/2017   Procedure: APPLICATION OF ROBOTIC ASSISTANCE FOR SPINAL PROCEDURE;  Surgeon: Ditty, Kevan Ny, MD;  Location: [place];  Service: Neurosurgery;  Laterality: N/A;   APPLICATION OF ROBOTIC ASSISTANCE FOR SPINAL PROCEDURE N/A 09/02/2017   Procedure: APPLICATION OF ROBOTIC ASSISTANCE FOR SPINAL PROCEDURE;  Surgeon: Ditty, Kevan Ny, MD;  Location: [place];  Service: Neurosurgery;  Laterality: N/A;   BACK SURGERY     BLADDER SUSPENSION  1991   tack   BRONCHIAL BIOPSY  09/15/2021   Procedure: BRONCHIAL BIOPSIES;  Surgeon: Garner Nash, DO;  Location: [facility];  Service: Pulmonary;;   BRONCHIAL BRUSHINGS  09/15/2021   Procedure: BRONCHIAL BRUSHINGS;  Surgeon: Garner Nash, DO;  Location: [facility];  Service: Pulmonary;;   BRONCHIAL NEEDLE ASPIRATION BIOPSY  09/15/2021   Procedure: BRONCHIAL NEEDLE ASPIRATION BIOPSIES;  Surgeon: Garner Nash, DO;  Location: [facility] [place];  Service: Pulmonary;;   BRONCHIAL WASHINGS  09/15/2021   Procedure: BRONCHIAL WASHINGS;  Surgeon: Garner Nash, DO;  Location: [facility];  Service: Pulmonary;;   FIDUCIAL MARKER PLACEMENT  09/15/2021   Procedure: FIDUCIAL MARKER PLACEMENT;  Surgeon: Garner Nash, DO;  Location: [facility];  Service: Pulmonary;;   INGUINAL HERNIA REPAIR Right 1991   LUMBAR FUSION  2016; 2017; 07/26/2017   L4-5; L2-3; L5-S1   POSTERIOR LUMBAR FUSION 4 WITH HARDWARE REMOVAL N/A 09/02/2017   Procedure: Lumbar three-four redo laminectomy; Repositioning of Left Sacral two screw; Extension of lumbar fusion to Thoracic twelve;  Surgeon: Ditty, Kevan Ny, MD;  Location: [facility];  Service: Neurosurgery;  Laterality: N/A;    TONSILLECTOMY AND ADENOIDECTOMY  1959   TUMOR EXCISION     WERTHIN'S TUMORS BOTH SIDES OF NECK   VAGINAL HYSTERECTOMY  1979   VIDEO BRONCHOSCOPY WITH RADIAL ENDOBRONCHIAL ULTRASOUND  09/15/2021   Procedure: VIDEO BRONCHOSCOPY WITH RADIAL ENDOBRONCHIAL ULTRASOUND;  Surgeon: Garner Nash, DO;  Location: [facility];  Service: Pulmonary;;    Social History   Socioeconomic History   Marital status: Married    Spouse name: [name]   Number of children: 1   Years of education: 12   Highest education level: Not on file  Occupational History   Not on file  Tobacco Use   Smoking status: Former    Packs/day: 1.50    Years: 35.00    Total pack years: 52.50    Types: Cigarettes    Quit date: 05/13/2006    Years since quitting: 15.9   Smokeless tobacco: Never  Vaping Use   Vaping  Use: Never used  Substance and Sexual Activity   Alcohol use: Yes    Comment: occasional   Drug use: No   Sexual activity: Not Currently    Birth control/protection: Surgical  Other Topics Concern   Not on file  Social History Narrative   Lives w/ husband   Married.   1 child, 1 grandchildren.   Retired. Once worked for [employer].   Enjoys reading.    Social Determinants of Health   Financial Resource Strain: Low Risk  (01/01/2022)   Overall Financial Resource Strain (CARDIA)    Difficulty of Paying Living Expenses: Not hard at all  Food Insecurity: No Food Insecurity (01/01/2022)   Hunger Vital Sign    Worried About Running Out of Food in the Last Year: Never true    Ran Out of Food in the Last Year: Never true  Transportation Needs: No Transportation Needs (01/01/2022)   PRAPARE - Hydrologist (Medical): No    Lack of Transportation (Non-Medical): No  Physical Activity: Insufficiently Active (01/01/2022)   Exercise Vital Sign    Days of Exercise per Week: 2 days    Minutes of Exercise per Session: 20 min  Stress: No Stress Concern Present (12/31/2020)   [place]    Feeling of Stress : Not at all  Social Connections: Moderately Isolated (01/01/2022)   Social Connection and Isolation Panel [NHANES]    Frequency of Communication with Friends and Family: Twice a week    Frequency of Social Gatherings with Friends and Family: Twice a week    Attends Religious Services: Never    Marine scientist or Organizations: No    Attends Archivist Meetings: Never    Marital Status: Married  Human resources officer Violence: Not At Risk (01/01/2022)   Humiliation, Afraid, Rape, and Kick questionnaire    Fear of Current or Ex-Partner: No    Emotionally Abused: No    Physically Abused: No    Sexually Abused: No     Allergies  Allergen Reactions   Shellfish Allergy Anaphylaxis, Swelling and Other (See Comments)    Tongue swells   Oxycodone Nausea Only   Penicillins Rash and Other (See Comments)    Has patient had a PCN reaction causing immediate rash, facial/tongue/throat swelling, SOB or lightheadedness with hypotension: Yes Has patient had a PCN reaction causing severe rash involving mucus membranes or skin necrosis: No Has patient had a PCN reaction that required hospitalization No Has patient had a PCN reaction occurring within the last 10 years: No If all of the above answers are "NO", then may proceed with Cephalosporin use.     Outpatient Medications Prior to Visit  Medication Sig Dispense Refill   atorvastatin (LIPITOR) 40 MG tablet Take 1 tablet (40 mg total) by mouth daily. For cholesterol. 90 tablet 3   baclofen (LIORESAL) 10 MG tablet TAKE 1 TABLET(10 MG) BY MOUTH THREE TIMES DAILY AS NEEDED FOR MUSCLE SPASMS 180 tablet 3   Biotin 5000 MCG CAPS Take 5,000 mcg by mouth every morning.      celecoxib (CELEBREX) 200 MG capsule Take 200 mg by mouth daily.     cetirizine (ZYRTEC) 10 MG tablet Take 1 tablet (10 mg total) by mouth daily. For allergies 90 tablet 0    Cholecalciferol (VITAMIN D) 50 MCG (2000 UT) tablet Take 2,000 Units by mouth daily.     Coenzyme Q10 (COQ10 PO) Take 120  mg by mouth every evening.     esomeprazole (NEXIUM 24HR) 20 MG capsule Take 1 capsule (20 mg total) by mouth daily at 12 noon. 90 capsule 1   fluocinonide cream (LIDEX) 0.05 % SMARTSIG:sparingly Topical Twice Daily PRN     gabapentin (NEURONTIN) 300 MG capsule Take 1 capsule (300 mg total) by mouth 2 (two) times daily. For pain. 180 capsule 3   ibuprofen (ADVIL) 200 MG tablet Take 400-600 mg by mouth every 8 (eight) hours as needed (pain).     lisinopril (ZESTRIL) 20 MG tablet TAKE 1 TABLET(20 MG) BY MOUTH DAILY FOR BLOOD PRESSURE 90 tablet 1   metFORMIN (GLUCOPHAGE) 500 MG tablet TAKE 1 TABLET BY MOUTH TWICE DAILY FOR DIABETES 180 tablet 1   oxybutynin (DITROPAN-XL) 5 MG 24 hr tablet TAKE 1 TABLET(5 MG) BY MOUTH AT BEDTIME FOR OVERACTIVE BLADDER 90 tablet 2   Polyethyl Glycol-Propyl Glycol (LUBRICANT EYE DROPS) 0.4-0.3 % SOLN Place 1-2 drops into both eyes 3 (three) times daily as needed (dry/irritated eyes.).     senna-docusate (SENOKOT-S) 8.6-50 MG tablet Take 2 tablets by mouth 2 (two) times daily.     terbinafine (LAMISIL) 250 MG tablet Take 1 tablet (250 mg total) by mouth daily. 30 tablet 1   No facility-administered medications prior to visit.    Review of Systems  Constitutional:  Negative for chills, fever, malaise/fatigue and weight loss.  HENT:  Negative for hearing loss, sore throat and tinnitus.   Eyes:  Negative for blurred vision and double vision.  Respiratory:  Negative for cough, hemoptysis, sputum production, shortness of breath, wheezing and stridor.   Cardiovascular:  Negative for chest pain, palpitations, orthopnea, leg swelling and PND.  Gastrointestinal:  Negative for abdominal pain, constipation, diarrhea, heartburn, nausea and vomiting.  Genitourinary:  Negative for dysuria, hematuria and urgency.  Musculoskeletal:  Negative for joint pain and  myalgias.  Skin:  Negative for itching and rash.  Neurological:  Negative for dizziness, tingling, weakness and headaches.  Endo/Heme/Allergies:  Negative for environmental allergies. Does not bruise/bleed easily.  Psychiatric/Behavioral:  Negative for depression. The patient is not nervous/anxious and does not have insomnia.   All other systems reviewed and are negative.    Objective:  Physical Exam Vitals reviewed.  Constitutional:      General: She is not in acute distress.    Appearance: She is well-developed.  HENT:     Head: Normocephalic and atraumatic.  Eyes:     General: No scleral icterus.    Conjunctiva/sclera: Conjunctivae normal.     Pupils: Pupils are equal, round, and reactive to light.  Neck:     Vascular: No JVD.     Trachea: No tracheal deviation.  Cardiovascular:     Rate and Rhythm: Normal rate and regular rhythm.     Heart sounds: Normal heart sounds. No murmur heard. Pulmonary:     Effort: Pulmonary effort is normal. No tachypnea, accessory muscle usage or respiratory distress.     Breath sounds: No stridor. No wheezing, rhonchi or rales.  Abdominal:     General: There is no distension.     Palpations: Abdomen is soft.     Tenderness: There is no abdominal tenderness.  Musculoskeletal:        General: No tenderness.     Cervical back: Neck supple.  Lymphadenopathy:     Cervical: No cervical adenopathy.  Skin:    General: Skin is warm and dry.     Capillary Refill: Capillary refill takes less  than 2 seconds.     Findings: No rash.  Neurological:     Mental Status: She is alert and oriented to person, place, and time.  Psychiatric:        Behavior: Behavior normal.      Vitals:   04/21/22 1614  BP: (!) 150/80  Pulse: 80  SpO2: 95%  Weight: 174 lb (78.9 kg)  Height: 5\' 7"  (1.702 m)   95% on RA BMI Readings from Last 3 Encounters:  04/21/22 27.25 kg/m  04/07/22 27.45 kg/m  04/02/22 27.16 kg/m   Wt Readings from Last 3 Encounters:   04/21/22 174 lb (78.9 kg)  04/07/22 175 lb 4 oz (79.5 kg)  04/02/22 173 lb 6.4 oz (78.7 kg)     CBC    Component Value Date/Time   WBC 7.4 09/15/2021 0941   RBC 4.66 09/15/2021 0941   HGB 13.9 09/15/2021 0941   HCT 41.9 09/15/2021 0941   PLT 197 09/15/2021 0941   MCV 89.9 09/15/2021 0941   MCH 29.8 09/15/2021 0941   MCHC 33.2 09/15/2021 0941   RDW 13.3 09/15/2021 0941   LYMPHSABS 0.8 09/08/2017 0935   MONOABS 0.6 09/08/2017 0935   EOSABS 0.2 09/08/2017 0935   BASOSABS 0.0 09/08/2017 0935     Chest Imaging: 07/01/2021 lung cancer screening CT: Abnormal left lower lobe nodule.  This has been present since January 2022.  There has been some slow evolution of the nodule. But has remained persistent. The patient's images have been independently reviewed by me.    Nuclear medicine pet imaging 07/20/2021: Low-level PET uptake. Cannot exclude low-grade malignancy. The patient's images have been independently reviewed by me.    Nuclear medicine pet imaging July 4888: Hypermetabolic uptake within a 16 mm solid component left lower lobe pulmonary nodule also areas of groundglass amongst the nodule concerning for primary bronchogenic carcinoma. This area has enlarged compared to previous imaging. The patient's images have been independently reviewed by me.    Pulmonary Functions Testing Results:     No data to display          FeNO:   Pathology:   09/15/2021 bronchoscopy: Pathology negative for malignancy on transbronchial biopsies  Echocardiogram:   Heart Catheterization:     Assessment & Plan:     ICD-10-CM   1. Lung nodule  R91.1 Procedural/ Surgical Case Request: ROBOTIC ASSISTED NAVIGATIONAL BRONCHOSCOPY    Ambulatory referral to Pulmonology    2. Tobacco abuse  Z72.0     3. Former smoker  Z87.891     4. Abnormal PET scan, lung  R94.2       Discussion:  This is a 71 year old female, left lower lobe pulmonary nodule has increased in size over time.   Initial bronchoscopy with biopsy tissue samples were negative for malignancy.  Repeat CT imaging shows that his enlargement with recent PET scan demonstrates hypermetabolic uptake within the central more solid 52mm component of the lesion.  Plan: Work on a plan to use a repeat bronchoscopy with robotic assistance and CT guidance through cone beam three-dimensional CT to help target towards the 16 mm solid component seen on pet imaging. Patient is agreeable to this plan. I think surgery conversation is difficult at this time.  I would like to know whether or not were dealing with malignancy before we commit her to a procedure.  She does have some mobile limitations but I do not think that it should exclude her from surgery. Therefore once we get a tissue  diagnosis we can talk with our thoracic surgeons for consideration of resection.   Current Outpatient Medications:    atorvastatin (LIPITOR) 40 MG tablet, Take 1 tablet (40 mg total) by mouth daily. For cholesterol., Disp: 90 tablet, Rfl: 3   baclofen (LIORESAL) 10 MG tablet, TAKE 1 TABLET(10 MG) BY MOUTH THREE TIMES DAILY AS NEEDED FOR MUSCLE SPASMS, Disp: 180 tablet, Rfl: 3   Biotin 5000 MCG CAPS, Take 5,000 mcg by mouth every morning. , Disp: , Rfl:    celecoxib (CELEBREX) 200 MG capsule, Take 200 mg by mouth daily., Disp: , Rfl:    cetirizine (ZYRTEC) 10 MG tablet, Take 1 tablet (10 mg total) by mouth daily. For allergies, Disp: 90 tablet, Rfl: 0   Cholecalciferol (VITAMIN D) 50 MCG (2000 UT) tablet, Take 2,000 Units by mouth daily., Disp: , Rfl:    Coenzyme Q10 (COQ10 PO), Take 120 mg by mouth every evening., Disp: , Rfl:    esomeprazole (NEXIUM 24HR) 20 MG capsule, Take 1 capsule (20 mg total) by mouth daily at 12 noon., Disp: 90 capsule, Rfl: 1   fluocinonide cream (LIDEX) 0.05 %, SMARTSIG:sparingly Topical Twice Daily PRN, Disp: , Rfl:    gabapentin (NEURONTIN) 300 MG capsule, Take 1 capsule (300 mg total) by mouth 2 (two) times daily. For  pain., Disp: 180 capsule, Rfl: 3   ibuprofen (ADVIL) 200 MG tablet, Take 400-600 mg by mouth every 8 (eight) hours as needed (pain)., Disp: , Rfl:    lisinopril (ZESTRIL) 20 MG tablet, TAKE 1 TABLET(20 MG) BY MOUTH DAILY FOR BLOOD PRESSURE, Disp: 90 tablet, Rfl: 1   metFORMIN (GLUCOPHAGE) 500 MG tablet, TAKE 1 TABLET BY MOUTH TWICE DAILY FOR DIABETES, Disp: 180 tablet, Rfl: 1   oxybutynin (DITROPAN-XL) 5 MG 24 hr tablet, TAKE 1 TABLET(5 MG) BY MOUTH AT BEDTIME FOR OVERACTIVE BLADDER, Disp: 90 tablet, Rfl: 2   Polyethyl Glycol-Propyl Glycol (LUBRICANT EYE DROPS) 0.4-0.3 % SOLN, Place 1-2 drops into both eyes 3 (three) times daily as needed (dry/irritated eyes.)., Disp: , Rfl:    senna-docusate (SENOKOT-S) 8.6-50 MG tablet, Take 2 tablets by mouth 2 (two) times daily., Disp: , Rfl:    terbinafine (LAMISIL) 250 MG tablet, Take 1 tablet (250 mg total) by mouth daily., Disp: 30 tablet, Rfl: 1  I spent 42 minutes dedicated to the care of this patient on the date of this encounter to include pre-visit review of records, face-to-face time with the patient discussing conditions above, post visit ordering of testing, clinical documentation with the electronic health record, making appropriate referrals as documented, and communicating necessary findings to members of the patients care team.    Garner Nash, [facility] 04/21/2022 4:56 PM

## 2022-04-21 NOTE — Patient Instructions (Signed)
Thank you for visiting Dr. Valeta Harms at [facility]. Today we recommend the following:  Orders Placed This Encounter  Procedures   Procedural/ Surgical Case Request: ROBOTIC ASSISTED NAVIGATIONAL BRONCHOSCOPY   Ambulatory referral to Pulmonology   Bronchoscopy on 05/04/2022  Return in about 20 days (around 05/11/2022) for with Eric Form, NP.    Please do your part to reduce the spread of COVID-19.

## 2022-04-21 NOTE — H&P (View-Only) (Signed)
Synopsis: Referred in December 2022 for lung nodule by Pleas Koch, NP  Subjective:   PATIENT ID: Megan [name] GENDER: female DOB: March 20, 1951, MRN: [id number]  Chief Complaint  Patient presents with   Follow-up    Follow-up PET scan    This is a 71 year old female, past medical history of reflux, pneumonia, arthritis.Patient had a abnormal lung cancer screening CT.  She has been enrolled in the program for several years follow with Eric Form, NP.  Last lung cancer screening CT was on 07/01/2021 which was read as a lung RADS 4B.  Patient was found to have a left lower lobe 13.5 mm lung nodule concerning for malignancy.  Patient ultimately had a nuclear medicine PET scan that was ordered and completed on 07/20/2021.  The left lower lobe lesion had low-grade/low-level FDG uptake.  Concern for possibility of a low-grade neoplasm due to its low-level FDG uptake.  Patient presents today to discuss next steps regarding abnormal finding on CT scan.  At baseline the patient uses a rolling walker and sometimes a wheelchair for ambulation or mobility.  OV 09/25/2021: This is a 71 year old female, past history of lower lobe lung nodule with a lung RADS 4B.  She was taken for bronchoscopy tissue biopsies were negative for malignancy cultures with no growth to date.  We reviewed this today in the office.  OV 04/21/2022: Here today for follow-up after recent CT imaging.  She had a nuclear medicine PET scan.  Nuclear medicine pet imaging completed on 04/09/2022 this revealed a left lower lobe pulmonary nodule measuring 3.9 x 1.8 the solid component at 16 mm with an SUV max of 5.7 concerning for primary bronchogenic carcinoma.  Patient was referred back to me to discuss repeat biopsy.  At baseline she has difficulty ambulating.  She has weakness in the right leg from a prior spinal surgery.  She also had a recent injury to the right foot from a fall.  She is in a boot.  Using a wheelchair and sometimes use  a motorized scooter at home.    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   Constipation due to pain medication    Debility    Dyspnea    W/ PHYS CONDITION    Emphysema of lung ([place])    Foul smelling urine 07/22/2020   GERD (gastroesophageal reflux disease)    History of bronchitis    "not since I quit smoking" (07/26/2017)   History of kidney stones    History of shingles    Hyperlipidemia    takes Fish Oil daily   Hypertension    Hypoalbuminemia due to protein-calorie malnutrition ([place])    L3 vertebral fracture ([place]) 09/01/2017   Myelopathy ([place]) 12/21/2016   Neuromuscular disorder (HCC)    tingling toes   Paraparesis ([place]) 12/15/2016   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/15/2016   Pneumonia 2009   PONV (postoperative nausea and vomiting)    Radiculopathy 07/28/2017   Restless leg    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   Type 2 diabetes mellitus ([place])    Uterine cancer ([place]) 1979   S/P hysterectomy   Weakness    numbness and tingling in both feet r/t back     Family History  Problem Relation  Age of Onset   Diabetes Mother    Dementia Mother    Cirrhosis Mother        Non alcoholic   COPD Father    Diabetes Brother      Past Surgical History:  Procedure Laterality Date   ABDOMINAL EXPOSURE N/A 07/26/2017   Procedure: ABDOMINAL EXPOSURE;  Surgeon: Angelia Mould, MD;  Location: [facility];  Service: Vascular;  Laterality: N/A;   ANTERIOR CERVICAL DECOMP/DISCECTOMY FUSION  2001   ANTERIOR LUMBAR FUSION N/A 07/26/2017   Procedure: Lumbar five-Sacral one Anterior lumbar interbody fusion with Dr. Deitra Mayo for approach;  Surgeon: Ditty, Kevan Ny, MD;  Location: [facility];  Service: Neurosurgery;   Laterality: N/A;   APPLICATION OF ROBOTIC ASSISTANCE FOR SPINAL PROCEDURE  12/19/2016   Procedure: APPLICATION OF ROBOTIC ASSISTANCE FOR SPINAL PROCEDURE;  Surgeon: Kevan Ny Ditty, MD;  Location: [facility];  Service: Neurosurgery;;   APPLICATION OF ROBOTIC ASSISTANCE FOR SPINAL PROCEDURE N/A 07/26/2017   Procedure: APPLICATION OF ROBOTIC ASSISTANCE FOR SPINAL PROCEDURE;  Surgeon: Ditty, Kevan Ny, MD;  Location: [facility];  Service: Neurosurgery;  Laterality: N/A;   APPLICATION OF ROBOTIC ASSISTANCE FOR SPINAL PROCEDURE N/A 09/02/2017   Procedure: APPLICATION OF ROBOTIC ASSISTANCE FOR SPINAL PROCEDURE;  Surgeon: Ditty, Kevan Ny, MD;  Location: [facility];  Service: Neurosurgery;  Laterality: N/A;   BACK SURGERY     BLADDER SUSPENSION  1991   tack   BRONCHIAL BIOPSY  09/15/2021   Procedure: BRONCHIAL BIOPSIES;  Surgeon: Garner Nash, DO;  Location: [facility];  Service: Pulmonary;;   BRONCHIAL BRUSHINGS  09/15/2021   Procedure: BRONCHIAL BRUSHINGS;  Surgeon: Garner Nash, DO;  Location: [facility];  Service: Pulmonary;;   BRONCHIAL NEEDLE ASPIRATION BIOPSY  09/15/2021   Procedure: BRONCHIAL NEEDLE ASPIRATION BIOPSIES;  Surgeon: Garner Nash, DO;  Location: [facility] [facility];  Service: Pulmonary;;   BRONCHIAL WASHINGS  09/15/2021   Procedure: BRONCHIAL WASHINGS;  Surgeon: Garner Nash, DO;  Location: [facility];  Service: Pulmonary;;   FIDUCIAL MARKER PLACEMENT  09/15/2021   Procedure: FIDUCIAL MARKER PLACEMENT;  Surgeon: Garner Nash, DO;  Location: [facility];  Service: Pulmonary;;   INGUINAL HERNIA REPAIR Right 1991   LUMBAR FUSION  2016; 2017; 07/26/2017   L4-5; L2-3; L5-S1   POSTERIOR LUMBAR FUSION 4 WITH HARDWARE REMOVAL N/A 09/02/2017   Procedure: Lumbar three-four redo laminectomy; Repositioning of Left Sacral two screw; Extension of lumbar fusion to Thoracic twelve;  Surgeon: Ditty, Kevan Ny, MD;  Location: [place] [facility];  Service: Neurosurgery;  Laterality: N/A;    TONSILLECTOMY AND ADENOIDECTOMY  1959   TUMOR EXCISION     WERTHIN'S TUMORS BOTH SIDES OF NECK   VAGINAL HYSTERECTOMY  1979   VIDEO BRONCHOSCOPY WITH RADIAL ENDOBRONCHIAL ULTRASOUND  09/15/2021   Procedure: VIDEO BRONCHOSCOPY WITH RADIAL ENDOBRONCHIAL ULTRASOUND;  Surgeon: Garner Nash, DO;  Location: [facility];  Service: Pulmonary;;    Social History   Socioeconomic History   Marital status: Married    Spouse name: [name]   Number of children: 1   Years of education: 12   Highest education level: Not on file  Occupational History   Not on file  Tobacco Use   Smoking status: Former    Packs/day: 1.50    Years: 35.00    Total pack years: 52.50    Types: Cigarettes    Quit date: 05/13/2006    Years since quitting: 15.9   Smokeless tobacco: Never  Vaping Use   Vaping  Use: Never used  Substance and Sexual Activity   Alcohol use: Yes    Comment: occasional   Drug use: No   Sexual activity: Not Currently    Birth control/protection: Surgical  Other Topics Concern   Not on file  Social History Narrative   Lives w/ husband   Married.   1 child, 1 grandchildren.   Retired. Once worked for [employer].   Enjoys reading.    Social Determinants of Health   Financial Resource Strain: Low Risk  (01/01/2022)   Overall Financial Resource Strain (CARDIA)    Difficulty of Paying Living Expenses: Not hard at all  Food Insecurity: No Food Insecurity (01/01/2022)   Hunger Vital Sign    Worried About Running Out of Food in the Last Year: Never true    Ran Out of Food in the Last Year: Never true  Transportation Needs: No Transportation Needs (01/01/2022)   PRAPARE - Hydrologist (Medical): No    Lack of Transportation (Non-Medical): No  Physical Activity: Insufficiently Active (01/01/2022)   Exercise Vital Sign    Days of Exercise per Week: 2 days    Minutes of Exercise per Session: 20 min  Stress: No Stress Concern Present (12/31/2020)   [place]    Feeling of Stress : Not at all  Social Connections: Moderately Isolated (01/01/2022)   Social Connection and Isolation Panel [NHANES]    Frequency of Communication with Friends and Family: Twice a week    Frequency of Social Gatherings with Friends and Family: Twice a week    Attends Religious Services: Never    Marine scientist or Organizations: No    Attends Archivist Meetings: Never    Marital Status: Married  Human resources officer Violence: Not At Risk (01/01/2022)   Humiliation, Afraid, Rape, and Kick questionnaire    Fear of Current or Ex-Partner: No    Emotionally Abused: No    Physically Abused: No    Sexually Abused: No     Allergies  Allergen Reactions   Shellfish Allergy Anaphylaxis, Swelling and Other (See Comments)    Tongue swells   Oxycodone Nausea Only   Penicillins Rash and Other (See Comments)    Has patient had a PCN reaction causing immediate rash, facial/tongue/throat swelling, SOB or lightheadedness with hypotension: Yes Has patient had a PCN reaction causing severe rash involving mucus membranes or skin necrosis: No Has patient had a PCN reaction that required hospitalization No Has patient had a PCN reaction occurring within the last 10 years: No If all of the above answers are "NO", then may proceed with Cephalosporin use.     Outpatient Medications Prior to Visit  Medication Sig Dispense Refill   atorvastatin (LIPITOR) 40 MG tablet Take 1 tablet (40 mg total) by mouth daily. For cholesterol. 90 tablet 3   baclofen (LIORESAL) 10 MG tablet TAKE 1 TABLET(10 MG) BY MOUTH THREE TIMES DAILY AS NEEDED FOR MUSCLE SPASMS 180 tablet 3   Biotin 5000 MCG CAPS Take 5,000 mcg by mouth every morning.      celecoxib (CELEBREX) 200 MG capsule Take 200 mg by mouth daily.     cetirizine (ZYRTEC) 10 MG tablet Take 1 tablet (10 mg total) by mouth daily. For allergies 90 tablet 0    Cholecalciferol (VITAMIN D) 50 MCG (2000 UT) tablet Take 2,000 Units by mouth daily.     Coenzyme Q10 (COQ10 PO) Take 120  mg by mouth every evening.     esomeprazole (NEXIUM 24HR) 20 MG capsule Take 1 capsule (20 mg total) by mouth daily at 12 noon. 90 capsule 1   fluocinonide cream (LIDEX) 0.05 % SMARTSIG:sparingly Topical Twice Daily PRN     gabapentin (NEURONTIN) 300 MG capsule Take 1 capsule (300 mg total) by mouth 2 (two) times daily. For pain. 180 capsule 3   ibuprofen (ADVIL) 200 MG tablet Take 400-600 mg by mouth every 8 (eight) hours as needed (pain).     lisinopril (ZESTRIL) 20 MG tablet TAKE 1 TABLET(20 MG) BY MOUTH DAILY FOR BLOOD PRESSURE 90 tablet 1   metFORMIN (GLUCOPHAGE) 500 MG tablet TAKE 1 TABLET BY MOUTH TWICE DAILY FOR DIABETES 180 tablet 1   oxybutynin (DITROPAN-XL) 5 MG 24 hr tablet TAKE 1 TABLET(5 MG) BY MOUTH AT BEDTIME FOR OVERACTIVE BLADDER 90 tablet 2   Polyethyl Glycol-Propyl Glycol (LUBRICANT EYE DROPS) 0.4-0.3 % SOLN Place 1-2 drops into both eyes 3 (three) times daily as needed (dry/irritated eyes.).     senna-docusate (SENOKOT-S) 8.6-50 MG tablet Take 2 tablets by mouth 2 (two) times daily.     terbinafine (LAMISIL) 250 MG tablet Take 1 tablet (250 mg total) by mouth daily. 30 tablet 1   No facility-administered medications prior to visit.    Review of Systems  Constitutional:  Negative for chills, fever, malaise/fatigue and weight loss.  HENT:  Negative for hearing loss, sore throat and tinnitus.   Eyes:  Negative for blurred vision and double vision.  Respiratory:  Negative for cough, hemoptysis, sputum production, shortness of breath, wheezing and stridor.   Cardiovascular:  Negative for chest pain, palpitations, orthopnea, leg swelling and PND.  Gastrointestinal:  Negative for abdominal pain, constipation, diarrhea, heartburn, nausea and vomiting.  Genitourinary:  Negative for dysuria, hematuria and urgency.  Musculoskeletal:  Negative for joint pain and  myalgias.  Skin:  Negative for itching and rash.  Neurological:  Negative for dizziness, tingling, weakness and headaches.  Endo/Heme/Allergies:  Negative for environmental allergies. Does not bruise/bleed easily.  Psychiatric/Behavioral:  Negative for depression. The patient is not nervous/anxious and does not have insomnia.   All other systems reviewed and are negative.    Objective:  Physical Exam Vitals reviewed.  Constitutional:      General: She is not in acute distress.    Appearance: She is well-developed.  HENT:     Head: Normocephalic and atraumatic.  Eyes:     General: No scleral icterus.    Conjunctiva/sclera: Conjunctivae normal.     Pupils: Pupils are equal, round, and reactive to light.  Neck:     Vascular: No JVD.     Trachea: No tracheal deviation.  Cardiovascular:     Rate and Rhythm: Normal rate and regular rhythm.     Heart sounds: Normal heart sounds. No murmur heard. Pulmonary:     Effort: Pulmonary effort is normal. No tachypnea, accessory muscle usage or respiratory distress.     Breath sounds: No stridor. No wheezing, rhonchi or rales.  Abdominal:     General: There is no distension.     Palpations: Abdomen is soft.     Tenderness: There is no abdominal tenderness.  Musculoskeletal:        General: No tenderness.     Cervical back: Neck supple.  Lymphadenopathy:     Cervical: No cervical adenopathy.  Skin:    General: Skin is warm and dry.     Capillary Refill: Capillary refill takes less  than 2 seconds.     Findings: No rash.  Neurological:     Mental Status: She is alert and oriented to person, place, and time.  Psychiatric:        Behavior: Behavior normal.      Vitals:   04/21/22 1614  BP: (!) 150/80  Pulse: 80  SpO2: 95%  Weight: 174 lb (78.9 kg)  Height: 5\' 7"  (1.702 m)   95% on RA BMI Readings from Last 3 Encounters:  04/21/22 27.25 kg/m  04/07/22 27.45 kg/m  04/02/22 27.16 kg/m   Wt Readings from Last 3 Encounters:   04/21/22 174 lb (78.9 kg)  04/07/22 175 lb 4 oz (79.5 kg)  04/02/22 173 lb 6.4 oz (78.7 kg)     CBC    Component Value Date/Time   WBC 7.4 09/15/2021 0941   RBC 4.66 09/15/2021 0941   HGB 13.9 09/15/2021 0941   HCT 41.9 09/15/2021 0941   PLT 197 09/15/2021 0941   MCV 89.9 09/15/2021 0941   MCH 29.8 09/15/2021 0941   MCHC 33.2 09/15/2021 0941   RDW 13.3 09/15/2021 0941   LYMPHSABS 0.8 09/08/2017 0935   MONOABS 0.6 09/08/2017 0935   EOSABS 0.2 09/08/2017 0935   BASOSABS 0.0 09/08/2017 0935     Chest Imaging: 07/01/2021 lung cancer screening CT: Abnormal left lower lobe nodule.  This has been present since January 2022.  There has been some slow evolution of the nodule. But has remained persistent. The patient's images have been independently reviewed by me.    Nuclear medicine pet imaging 07/20/2021: Low-level PET uptake. Cannot exclude low-grade malignancy. The patient's images have been independently reviewed by me.    Nuclear medicine pet imaging July 2831: Hypermetabolic uptake within a 16 mm solid component left lower lobe pulmonary nodule also areas of groundglass amongst the nodule concerning for primary bronchogenic carcinoma. This area has enlarged compared to previous imaging. The patient's images have been independently reviewed by me.    Pulmonary Functions Testing Results:     No data to display          FeNO:   Pathology:   09/15/2021 bronchoscopy: Pathology negative for malignancy on transbronchial biopsies  Echocardiogram:   Heart Catheterization:     Assessment & Plan:     ICD-10-CM   1. Lung nodule  R91.1 Procedural/ Surgical Case Request: ROBOTIC ASSISTED NAVIGATIONAL BRONCHOSCOPY    Ambulatory referral to Pulmonology    2. Tobacco abuse  Z72.0     3. Former smoker  Z87.891     4. Abnormal PET scan, lung  R94.2       Discussion:  This is a 71 year old female, left lower lobe pulmonary nodule has increased in size over time.   Initial bronchoscopy with biopsy tissue samples were negative for malignancy.  Repeat CT imaging shows that his enlargement with recent PET scan demonstrates hypermetabolic uptake within the central more solid 31mm component of the lesion.  Plan: Work on a plan to use a repeat bronchoscopy with robotic assistance and CT guidance through cone beam three-dimensional CT to help target towards the 16 mm solid component seen on pet imaging. Patient is agreeable to this plan. I think surgery conversation is difficult at this time.  I would like to know whether or not were dealing with malignancy before we commit her to a procedure.  She does have some mobile limitations but I do not think that it should exclude her from surgery. Therefore once we get a tissue  diagnosis we can talk with our thoracic surgeons for consideration of resection.   Current Outpatient Medications:    atorvastatin (LIPITOR) 40 MG tablet, Take 1 tablet (40 mg total) by mouth daily. For cholesterol., Disp: 90 tablet, Rfl: 3   baclofen (LIORESAL) 10 MG tablet, TAKE 1 TABLET(10 MG) BY MOUTH THREE TIMES DAILY AS NEEDED FOR MUSCLE SPASMS, Disp: 180 tablet, Rfl: 3   Biotin 5000 MCG CAPS, Take 5,000 mcg by mouth every morning. , Disp: , Rfl:    celecoxib (CELEBREX) 200 MG capsule, Take 200 mg by mouth daily., Disp: , Rfl:    cetirizine (ZYRTEC) 10 MG tablet, Take 1 tablet (10 mg total) by mouth daily. For allergies, Disp: 90 tablet, Rfl: 0   Cholecalciferol (VITAMIN D) 50 MCG (2000 UT) tablet, Take 2,000 Units by mouth daily., Disp: , Rfl:    Coenzyme Q10 (COQ10 PO), Take 120 mg by mouth every evening., Disp: , Rfl:    esomeprazole (NEXIUM 24HR) 20 MG capsule, Take 1 capsule (20 mg total) by mouth daily at 12 noon., Disp: 90 capsule, Rfl: 1   fluocinonide cream (LIDEX) 0.05 %, SMARTSIG:sparingly Topical Twice Daily PRN, Disp: , Rfl:    gabapentin (NEURONTIN) 300 MG capsule, Take 1 capsule (300 mg total) by mouth 2 (two) times daily. For  pain., Disp: 180 capsule, Rfl: 3   ibuprofen (ADVIL) 200 MG tablet, Take 400-600 mg by mouth every 8 (eight) hours as needed (pain)., Disp: , Rfl:    lisinopril (ZESTRIL) 20 MG tablet, TAKE 1 TABLET(20 MG) BY MOUTH DAILY FOR BLOOD PRESSURE, Disp: 90 tablet, Rfl: 1   metFORMIN (GLUCOPHAGE) 500 MG tablet, TAKE 1 TABLET BY MOUTH TWICE DAILY FOR DIABETES, Disp: 180 tablet, Rfl: 1   oxybutynin (DITROPAN-XL) 5 MG 24 hr tablet, TAKE 1 TABLET(5 MG) BY MOUTH AT BEDTIME FOR OVERACTIVE BLADDER, Disp: 90 tablet, Rfl: 2   Polyethyl Glycol-Propyl Glycol (LUBRICANT EYE DROPS) 0.4-0.3 % SOLN, Place 1-2 drops into both eyes 3 (three) times daily as needed (dry/irritated eyes.)., Disp: , Rfl:    senna-docusate (SENOKOT-S) 8.6-50 MG tablet, Take 2 tablets by mouth 2 (two) times daily., Disp: , Rfl:    terbinafine (LAMISIL) 250 MG tablet, Take 1 tablet (250 mg total) by mouth daily., Disp: 30 tablet, Rfl: 1  I spent 42 minutes dedicated to the care of this patient on the date of this encounter to include pre-visit review of records, face-to-face time with the patient discussing conditions above, post visit ordering of testing, clinical documentation with the electronic health record, making appropriate referrals as documented, and communicating necessary findings to members of the patients care team.    Garner Nash, [facility] 04/21/2022 4:56 PM

## 2022-04-23 IMAGING — PT NM PET TUM IMG INITIAL (PI) SKULL BASE T - THIGH
1 of 7 series · 1 of 25 positions shown · non-contrast
Comparison: Lung cancer screening CT 07/01/2021

CLINICAL DATA: Pulmonary nodule.  Treatment strategy for initial.

EXAM:
NUCLEAR MEDICINE PET SKULL BASE TO THIGH
TECHNIQUE: 8.8 mCi F-18 FDG was injected intravenously. Full-ring PET imaging
was performed from the skull base to thigh after the radiotracer. CT
data was obtained and used for attenuation correction and anatomic
localization.
Fasting blood glucose: 167 mg/dl

[Series 4: ct sk_thigh 5.0 bf37 · axial · 5.0mm · 0.98mm/px · 1 of 228 slices shown]
[im 228/228  brain]
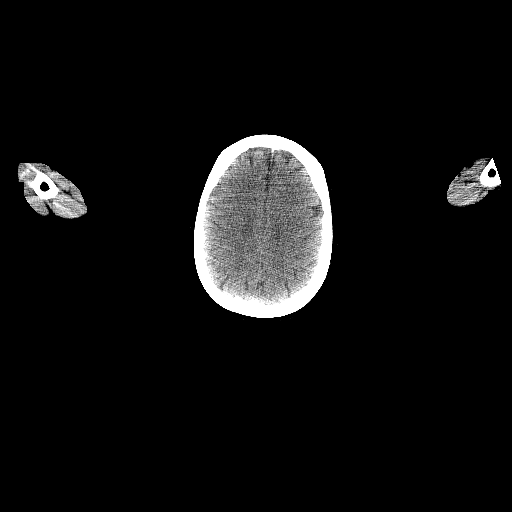

[1 of 25 positions shown; findings below may reference images not displayed]

FINDINGS: Mediastinal blood pool activity: SUV max

Liver activity: SUV max NA

NECK: No hypermetabolic lymph nodes in the neck.

Incidental CT findings: none

CHEST: No substantial hypermetabolism in the ill-defined
consolidative nodular opacity of concern in the medial left lower
lobe ( SUV max = 2.2). Imaging features today are obscured by
breathing motion but there may be some interval decrease in this
nodule since 07/01/2021. No hypermetabolic lymphadenopathy in the
chest.

Incidental CT findings: There is mild atherosclerotic calcification
of the abdominal aorta without aneurysm.

ABDOMEN/PELVIS: No abnormal hypermetabolic activity within the
liver, pancreas, adrenal glands, or spleen. No hypermetabolic lymph
nodes in the abdomen or pelvis.

Incidental CT findings: There is moderate atherosclerotic
calcification of the abdominal aorta without aneurysm. Diverticular
changes noted left colon without diverticulitis. Moderate bladder
distension.

SKELETON: No focal hypermetabolic activity to suggest skeletal
metastasis.

Incidental CT findings: Extensive thoracolumbar fusion hardware
extends caudally to the sacrum.
IMPRESSION: 1. Low level FDG uptake in the medial left lower lobe lesion of
concern on recent lung cancer screening. Although lung bases are
partially obscured by breathing motion, this nodule does appear less
confluent than previous exam. As well differentiated or low-grade
neoplasm can be poorly FDG avid, close continued follow-up
recommended.
2. No other areas of unexpected or suspicious hypermetabolism on
today's study.
3.  Aortic Atherosclerois ([id number])

## 2022-04-30 ENCOUNTER — Other Ambulatory Visit: Payer: PPO

## 2022-04-30 ENCOUNTER — Other Ambulatory Visit: Payer: Self-pay | Admitting: Internal Medicine

## 2022-04-30 ENCOUNTER — Other Ambulatory Visit: Payer: Self-pay

## 2022-04-30 DIAGNOSIS — Z01812 Encounter for preprocedural laboratory examination: Secondary | ICD-10-CM

## 2022-05-02 LAB — NOVEL CORONAVIRUS, NAA: SARS-CoV-2, NAA: NOT DETECTED

## 2022-05-03 ENCOUNTER — Other Ambulatory Visit: Payer: Self-pay

## 2022-05-03 ENCOUNTER — Encounter (HOSPITAL_COMMUNITY): Payer: Self-pay | Admitting: Pulmonary Disease

## 2022-05-03 NOTE — Progress Notes (Signed)
SDW CALL  Patient was given pre-op instructions over the phone. The opportunity was given for the patient to ask questions. No further questions asked. Patient verbalized understanding of instructions given.   PCP - Dr. Alma Friendly Cardiologist - denies  Chest x-ray - 09-15-21 EKG - 09-15-21 Stress Test - denies ECHO - denies Cardiac Cath - denies   Fasting Blood Sugar - 130's Checks Blood Sugar daily. Check CBG in DOS, if less than 70, have half a cup of a clear juice. No Metformin DOS   ERAS Protcol - NPO PRE-SURGERY Ensure or G2-   COVID TEST- Negative 04/30/22   Anesthesia review: No  Patient denies shortness of breath, fever, cough and chest pain over the phone call   All instructions explained to the patient, with a verbal understanding of the material. Patient agrees to go over the instructions while at home for a better understanding.

## 2022-05-03 NOTE — Anesthesia Preprocedure Evaluation (Signed)
Anesthesia Evaluation  Patient identified by MRN, date of birth, ID band Patient awake    Reviewed: Allergy & Precautions, NPO status , Patient's Chart, lab work & pertinent test results  History of Anesthesia Complications (+) PONV and history of anesthetic complications  Airway Mallampati: II  TM Distance: >3 FB Neck ROM: Full    Dental  (+) Edentulous Upper, Edentulous Lower   Pulmonary shortness of breath, pneumonia, COPD, former smoker,    Pulmonary exam normal breath sounds clear to auscultation       Cardiovascular hypertension, Pt. on medications Normal cardiovascular exam Rhythm:Regular Rate:Normal     Neuro/Psych  Neuromuscular disease    GI/Hepatic Neg liver ROS, GERD  Medicated,  Endo/Other  diabetes, Type 2, Oral Hypoglycemic Agents  Renal/GU negative Renal ROS     Musculoskeletal  (+) Arthritis ,   Abdominal   Peds  Hematology  (+) Blood dyscrasia, anemia ,   Anesthesia Other Findings   Reproductive/Obstetrics                            Anesthesia Physical  Anesthesia Plan  ASA: 3  Anesthesia Plan: General   Post-op Pain Management: Minimal or no pain anticipated   Induction: Intravenous  PONV Risk Score and Plan: 4 or greater and Ondansetron, Treatment may vary due to age or medical condition and Dexamethasone  Airway Management Planned: Oral ETT  Additional Equipment: None  Intra-op Plan:   Post-operative Plan: Extubation in OR  Informed Consent: I have reviewed the patients History and Physical, chart, labs and discussed the procedure including the risks, benefits and alternatives for the proposed anesthesia with the patient or authorized representative who has indicated his/her understanding and acceptance.     Dental advisory given  Plan Discussed with: CRNA  Anesthesia Plan Comments:        Anesthesia Quick Evaluation

## 2022-05-04 ENCOUNTER — Ambulatory Visit (HOSPITAL_COMMUNITY): Payer: PPO

## 2022-05-04 ENCOUNTER — Encounter (HOSPITAL_COMMUNITY)
Admission: RE | Disposition: A | Discharge status: Home / Self Care | Payer: Self-pay | Source: Home / Self Care | Attending: Pulmonary Disease

## 2022-05-04 ENCOUNTER — Ambulatory Visit (HOSPITAL_BASED_OUTPATIENT_CLINIC_OR_DEPARTMENT_OTHER): Payer: PPO | Admitting: Anesthesiology

## 2022-05-04 ENCOUNTER — Encounter (HOSPITAL_COMMUNITY): Payer: Self-pay | Admitting: Pulmonary Disease

## 2022-05-04 ENCOUNTER — Other Ambulatory Visit: Payer: Self-pay

## 2022-05-04 ENCOUNTER — Ambulatory Visit (HOSPITAL_COMMUNITY): Payer: PPO | Admitting: Anesthesiology

## 2022-05-04 ENCOUNTER — Ambulatory Visit (HOSPITAL_COMMUNITY)
Admission: RE | Admit: 2022-05-04 | Discharge: 2022-05-04 | Disposition: A | Discharge status: Home / Self Care | Payer: PPO | Attending: Pulmonary Disease | Admitting: Pulmonary Disease

## 2022-05-04 DIAGNOSIS — I10 Essential (primary) hypertension: Secondary | ICD-10-CM | POA: Insufficient documentation

## 2022-05-04 DIAGNOSIS — Z7984 Long term (current) use of oral hypoglycemic drugs: Secondary | ICD-10-CM | POA: Diagnosis not present

## 2022-05-04 DIAGNOSIS — E1140 Type 2 diabetes mellitus with diabetic neuropathy, unspecified: Secondary | ICD-10-CM | POA: Diagnosis not present

## 2022-05-04 DIAGNOSIS — Z87891 Personal history of nicotine dependence: Secondary | ICD-10-CM

## 2022-05-04 DIAGNOSIS — K219 Gastro-esophageal reflux disease without esophagitis: Secondary | ICD-10-CM | POA: Diagnosis not present

## 2022-05-04 DIAGNOSIS — R911 Solitary pulmonary nodule: Secondary | ICD-10-CM

## 2022-05-04 DIAGNOSIS — Z9889 Other specified postprocedural states: Secondary | ICD-10-CM | POA: Diagnosis not present

## 2022-05-04 DIAGNOSIS — E119 Type 2 diabetes mellitus without complications: Secondary | ICD-10-CM | POA: Diagnosis not present

## 2022-05-04 DIAGNOSIS — J984 Other disorders of lung: Secondary | ICD-10-CM | POA: Diagnosis not present

## 2022-05-04 DIAGNOSIS — J449 Chronic obstructive pulmonary disease, unspecified: Secondary | ICD-10-CM

## 2022-05-04 DIAGNOSIS — Z79899 Other long term (current) drug therapy: Secondary | ICD-10-CM | POA: Insufficient documentation

## 2022-05-04 DIAGNOSIS — M1990 Unspecified osteoarthritis, unspecified site: Secondary | ICD-10-CM | POA: Diagnosis not present

## 2022-05-04 DIAGNOSIS — R918 Other nonspecific abnormal finding of lung field: Secondary | ICD-10-CM | POA: Diagnosis present

## 2022-05-04 DIAGNOSIS — C3432 Malignant neoplasm of lower lobe, left bronchus or lung: Secondary | ICD-10-CM | POA: Diagnosis not present

## 2022-05-04 HISTORY — PX: BRONCHIAL NEEDLE ASPIRATION BIOPSY: SHX5106

## 2022-05-04 HISTORY — PX: VIDEO BRONCHOSCOPY WITH RADIAL ENDOBRONCHIAL ULTRASOUND: SHX6849

## 2022-05-04 HISTORY — PX: BRONCHIAL BIOPSY: SHX5109

## 2022-05-04 LAB — GLUCOSE, CAPILLARY
Glucose-Capillary: 166 mg/dL — ABNORMAL HIGH (ref 70–99)
Glucose-Capillary: 153 mg/dL — ABNORMAL HIGH (ref 70–99)

## 2022-05-04 LAB — CBC
HCT: 39.6 % (ref 36.0–46.0)
Hemoglobin: 13.4 g/dL (ref 12.0–15.0)
MCH: 30.1 pg (ref 26.0–34.0)
MCHC: 33.8 g/dL (ref 30.0–36.0)
MCV: 89 fL (ref 80.0–100.0)
Platelets: 190 10*3/uL (ref 150–400)
RBC: 4.45 MIL/uL (ref 3.87–5.11)
RDW: 13.5 % (ref 11.5–15.5)
WBC: 6.2 10*3/uL (ref 4.0–10.5)
nRBC: 0 % (ref 0.0–0.2)

## 2022-05-04 LAB — BASIC METABOLIC PANEL
Anion gap: 6 (ref 5–15)
BUN: 14 mg/dL (ref 8–23)
CO2: 25 mmol/L (ref 22–32)
Calcium: 8.7 mg/dL — ABNORMAL LOW (ref 8.9–10.3)
Chloride: 107 mmol/L (ref 98–111)
Creatinine, Ser: 0.97 mg/dL (ref 0.44–1.00)
GFR, Estimated: >60 mL/min (ref >60)
Glucose, Bld: 162 mg/dL — ABNORMAL HIGH (ref 70–99)
Potassium: 4.4 mmol/L (ref 3.5–5.1)
Sodium: 138 mmol/L (ref 135–145)

## 2022-05-04 SURGERY — BRONCHOSCOPY, WITH BIOPSY USING ELECTROMAGNETIC NAVIGATION
Anesthesia: General | Laterality: Bilateral

## 2022-05-04 MED ORDER — MIDAZOLAM HCL 2 MG/2ML IJ SOLN
INTRAMUSCULAR | Status: DC | PRN
  Administered 2022-05-04: 1 mg via INTRAVENOUS

## 2022-05-04 MED ORDER — CHLORHEXIDINE GLUCONATE 0.12 % MT SOLN
15.0000 mL | OROMUCOSAL | Status: AC
  Administered 2022-05-04: 15 mL via OROMUCOSAL
  Filled 2022-05-04 (×2): qty 15

## 2022-05-04 MED ORDER — INSULIN ASPART 100 UNIT/ML IJ SOLN
0.0000 [IU] | INTRAMUSCULAR | Status: DC | PRN
  Administered 2022-05-04: 2 [IU] via SUBCUTANEOUS
  Filled 2022-05-04: qty 0.14

## 2022-05-04 MED ORDER — PROMETHAZINE HCL 25 MG/ML IJ SOLN
6.2500 mg | INTRAMUSCULAR | Status: DC | PRN
  Administered 2022-05-04: 6.25 mg via INTRAVENOUS

## 2022-05-04 MED ORDER — DEXAMETHASONE SODIUM PHOSPHATE 10 MG/ML IJ SOLN
INTRAMUSCULAR | Status: DC | PRN
  Administered 2022-05-04: 5 mg via INTRAVENOUS

## 2022-05-04 MED ORDER — PROPOFOL 10 MG/ML IV BOLUS
INTRAVENOUS | Status: DC | PRN
  Administered 2022-05-04: 110 mg via INTRAVENOUS

## 2022-05-04 MED ORDER — LACTATED RINGERS IV SOLN: INTRAVENOUS | Status: DC

## 2022-05-04 MED ORDER — PROMETHAZINE HCL 25 MG/ML IJ SOLN
INTRAMUSCULAR | Status: AC
  Filled 2022-05-04: qty 1

## 2022-05-04 MED ORDER — FENTANYL CITRATE (PF) 100 MCG/2ML IJ SOLN
INTRAMUSCULAR | Status: DC | PRN
  Administered 2022-05-04: 50 ug via INTRAVENOUS

## 2022-05-04 MED ORDER — PROPOFOL 500 MG/50ML IV EMUL
INTRAVENOUS | Status: DC | PRN
  Administered 2022-05-04: 125 ug/kg/min via INTRAVENOUS

## 2022-05-04 MED ORDER — INSULIN ASPART 100 UNIT/ML IJ SOLN
INTRAMUSCULAR | Status: AC
  Filled 2022-05-04: qty 1

## 2022-05-04 MED ORDER — ONDANSETRON HCL 4 MG/2ML IJ SOLN
INTRAMUSCULAR | Status: DC | PRN
  Administered 2022-05-04: 4 mg via INTRAVENOUS

## 2022-05-04 MED ORDER — FENTANYL CITRATE (PF) 100 MCG/2ML IJ SOLN: 25.0000 ug | INTRAMUSCULAR | Status: DC | PRN

## 2022-05-04 MED ORDER — LIDOCAINE 2% (20 MG/ML) 5 ML SYRINGE
INTRAMUSCULAR | Status: DC | PRN
  Administered 2022-05-04: 80 mg via INTRAVENOUS

## 2022-05-04 MED ORDER — SUGAMMADEX SODIUM 200 MG/2ML IV SOLN
INTRAVENOUS | Status: DC | PRN
  Administered 2022-05-04: 200 mg via INTRAVENOUS

## 2022-05-04 MED ORDER — ROCURONIUM BROMIDE 10 MG/ML (PF) SYRINGE
PREFILLED_SYRINGE | INTRAVENOUS | Status: DC | PRN
  Administered 2022-05-04: 50 mg via INTRAVENOUS
  Administered 2022-05-04: 20 mg via INTRAVENOUS

## 2022-05-04 NOTE — Interval H&P Note (Signed)
History and Physical Interval Note:  05/04/2022 7:20 AM  Megan [name]  has presented today for surgery, with the diagnosis of lung nodule.  The various methods of treatment have been discussed with the patient and family. After consideration of risks, benefits and other options for treatment, the patient has consented to  Procedure(s) with comments: ROBOTIC ASSISTED NAVIGATIONAL BRONCHOSCOPY (Bilateral) - ION w/ CIOS as a surgical intervention.  The patient's history has been reviewed, patient examined, no change in status, stable for surgery.  I have reviewed the patient's chart and labs.  Questions were answered to the patient's satisfaction.     [place]

## 2022-05-04 NOTE — Anesthesia Procedure Notes (Signed)
Procedure Name: Intubation Date/Time: 05/04/2022 7:44 AM  Performed by: Barrington Ellison, CRNAPre-anesthesia Checklist: Patient identified, Emergency Drugs available, Suction available and Patient being monitored Patient Re-evaluated:Patient Re-evaluated prior to induction Oxygen Delivery Method: Circle System Utilized Preoxygenation: Pre-oxygenation with 100% oxygen Induction Type: IV induction Ventilation: Mask ventilation without difficulty Laryngoscope Size: Mac and 3 Grade View: Grade I Tube type: Oral Tube size: 8.5 mm Number of attempts: 1 Airway Equipment and Method: Stylet and Oral airway Placement Confirmation: ETT inserted through vocal cords under direct vision, positive ETCO2 and breath sounds checked- equal and bilateral Secured at: 21 cm Tube secured with: Tape Dental Injury: Teeth and Oropharynx as per pre-operative assessment

## 2022-05-04 NOTE — Transfer of Care (Signed)
Immediate Anesthesia Transfer of Care Note  Patient: Megan [name]  Procedure(s) Performed: ROBOTIC ASSISTED NAVIGATIONAL BRONCHOSCOPY (Bilateral) VIDEO BRONCHOSCOPY WITH RADIAL ENDOBRONCHIAL ULTRASOUND BRONCHIAL BIOPSIES BRONCHIAL NEEDLE ASPIRATION BIOPSIES  Patient Location: PACU  Anesthesia Type:General  Level of Consciousness: drowsy and patient cooperative  Airway & Oxygen Therapy: Patient Spontanous Breathing  Post-op Assessment: Report given to RN  Post vital signs: Reviewed and stable  Last Vitals:  Vitals Value Taken Time  BP 178/87 05/04/22 0848  Temp    Pulse 86 05/04/22 0849  Resp 15 05/04/22 0849  SpO2 92 % 05/04/22 0849  Vitals shown include unvalidated device data.  Last Pain:  Vitals:   05/04/22 0550  TempSrc: Oral         Complications: No notable events documented.

## 2022-05-04 NOTE — Op Note (Addendum)
Video Bronchoscopy with Robotic Assisted Bronchoscopic Navigation   Date of Operation: 05/04/2022   Pre-op Diagnosis: lung nodule   Post-op Diagnosis: lung nodule   Surgeon: Garner Nash, DO   Assistants: None   Anesthesia: General endotracheal anesthesia  Operation: Flexible video fiberoptic bronchoscopy with robotic assistance and biopsies.  Estimated Blood Loss: Minimal  Complications: None  Indications and History: Megan [name] is a 71 y.o. female with history of LLL lung nodule. The risks, benefits, complications, treatment options and expected outcomes were discussed with the patient.  The possibilities of pneumothorax, pneumonia, reaction to medication, pulmonary aspiration, perforation of a viscus, bleeding, failure to diagnose a condition and creating a complication requiring transfusion or operation were discussed with the patient who freely signed the consent.    Description of Procedure: The patient was seen in the Preoperative Area, was examined and was deemed appropriate to proceed.  The patient was taken to [facility] endoscopy room 3, identified as Megan [name] and the procedure verified as Flexible Video Fiberoptic Bronchoscopy.  A Time Out was held and the above information confirmed.   Prior to the date of the procedure a high-resolution CT scan of the chest was performed. Utilizing ION software program a virtual tracheobronchial tree was generated to allow the creation of distinct navigation pathways to the patient's parenchymal abnormalities. After being taken to the operating room general anesthesia was initiated and the patient  was orally intubated. The video fiberoptic bronchoscope was introduced via the endotracheal tube and a general inspection was performed which showed normal right and left lung anatomy, aspiration of the bilateral mainstems was completed to remove any remaining secretions. Robotic catheter inserted into patient's endotracheal tube.   Target  #1 LLL lung nodule: The distinct navigation pathways prepared prior to this procedure were then utilized to navigate to patient's lesion identified on CT scan. The robotic catheter was secured into place and the vision probe was withdrawn.  Lesion location was approximated using fluoroscopy and 3D CBCT imaging for peripheral targeting. Under fluoroscopic guidance transbronchial needle brushings, transbronchial needle biopsies, and transbronchial forceps biopsies were performed to be sent for cytology and pathology.   At the end of the procedure a general airway inspection was performed and there was no evidence of active bleeding. The bronchoscope was removed.  The patient tolerated the procedure well. There was no significant blood loss and there were no obvious complications. A post-procedural chest x-ray is pending.  Samples Target #1: 1. Transbronchial Wang needle biopsies from Left lower lobe 2. Transbronchial forceps biopsies from left lower lobe  Plans:  The patient will be discharged from the PACU to home when recovered from anesthesia and after chest x-ray is reviewed. We will review the cytology, pathology results with the patient when they become available. Outpatient followup will be with Garner Nash, [place] Alphonsus Doyel, DO [facility] 05/04/2022 8:55 AM

## 2022-05-04 NOTE — Anesthesia Postprocedure Evaluation (Signed)
Anesthesia Post Note  Patient: Megan [name]  Procedure(s) Performed: ROBOTIC ASSISTED NAVIGATIONAL BRONCHOSCOPY (Bilateral) VIDEO BRONCHOSCOPY WITH RADIAL ENDOBRONCHIAL ULTRASOUND BRONCHIAL BIOPSIES BRONCHIAL NEEDLE ASPIRATION BIOPSIES     Patient location during evaluation: PACU Anesthesia Type: General Level of consciousness: sedated and patient cooperative Pain management: pain level controlled Vital Signs Assessment: post-procedure vital signs reviewed and stable Respiratory status: spontaneous breathing Cardiovascular status: stable Anesthetic complications: no   No notable events documented.  Last Vitals:  Vitals:   05/04/22 0945 05/04/22 1000  BP: (!) 190/92 (!) 188/90  Pulse: 71 72  Resp: 15 11  Temp: 36.9 C   SpO2: 97% 97%    Last Pain:  Vitals:   05/04/22 0550  TempSrc: Oral                 Nolon Nations

## 2022-05-04 NOTE — Discharge Instructions (Signed)
Flexible Bronchoscopy, Care After This sheet gives you information about how to care for yourself after your test. Your doctor may also give you more specific instructions. If you have problems or questions, contact your doctor. Follow these instructions at home: Eating and drinking Do not eat or drink anything (not even water) for 2 hours after your test, or until your numbing medicine (local anesthetic) wears off. When your numbness is gone and your cough and gag reflexes have come back, you may: Eat only soft foods. Slowly drink liquids. The day after the test, go back to your normal diet. Driving Do not drive for 24 hours if you were given a medicine to help you relax (sedative). Do not drive or use heavy machinery while taking prescription pain medicine. General instructions  Take over-the-counter and prescription medicines only as told by your doctor. Return to your normal activities as told. Ask what activities are safe for you. Do not use any products that have nicotine or tobacco in them. This includes cigarettes and e-cigarettes. If you need help quitting, ask your doctor. Keep all follow-up visits as told by your doctor. This is important. It is very important if you had a tissue sample (biopsy) taken. Get help right away if: You have shortness of breath that gets worse. You get light-headed. You feel like you are going to pass out (faint). You have chest pain. You cough up: More than a little blood. More blood than before. Summary Do not eat or drink anything (not even water) for 2 hours after your test, or until your numbing medicine wears off. Do not use cigarettes. Do not use e-cigarettes. Get help right away if you have chest pain.  This information is not intended to replace advice given to you by your health care provider. Make sure you discuss any questions you have with your health care provider. Document Released: 06/27/2009 Document Revised: 08/12/2017 Document  Reviewed: 09/17/2016 Elsevier Patient Education  2020 [employer].

## 2022-05-05 ENCOUNTER — Encounter (HOSPITAL_COMMUNITY): Payer: Self-pay | Admitting: Pulmonary Disease

## 2022-05-05 ENCOUNTER — Telehealth: Payer: Self-pay | Admitting: *Deleted

## 2022-05-05 LAB — ACID FAST SMEAR (AFB, MYCOBACTERIA): Acid Fast Smear: NEGATIVE

## 2022-05-05 NOTE — Telephone Encounter (Signed)
Received fax from [facility] requesting fax for terbinafine 250 mg.  PA completed on CoverMyMeds and sent to [employer] for review.  Can take up to 72 hours for a decision.

## 2022-05-06 LAB — CYTOLOGY - NON PAP

## 2022-05-06 MED ORDER — CELECOXIB 200 MG PO CAPS: 200.0000 mg | ORAL_CAPSULE | Freq: Every day | ORAL | 0 refills | Status: DC

## 2022-05-06 NOTE — Telephone Encounter (Signed)
done

## 2022-05-06 NOTE — Telephone Encounter (Signed)
Receive fax from [employer] requesting more information on terbinafine 250 mg.  Forms placed in Dr. Lillie Fragmin office in box to help complete.

## 2022-05-06 NOTE — Telephone Encounter (Signed)
[employer] is calling in stating they need to go clinical information with someone who handles PA's.

## 2022-05-06 NOTE — Telephone Encounter (Signed)
Per fax received  for [employer], they will cover 84 tablets per 180 days.  Patient has received #60 tablets since 01/29/2022.  Spoke with patient to advise her of this. We will resend in Rx for #24 tablets to equal her 84 tablet allowance.

## 2022-05-06 NOTE — Addendum Note (Signed)
Addended by: Carter Kitten on: 05/06/2022 04:06 PM   Modules accepted: Orders

## 2022-05-07 ENCOUNTER — Telehealth: Payer: Self-pay

## 2022-05-07 ENCOUNTER — Encounter: Payer: Self-pay | Admitting: Acute Care

## 2022-05-07 ENCOUNTER — Ambulatory Visit: Payer: PPO | Admitting: Acute Care

## 2022-05-07 VITALS — BP 138/78 | HR 78 | Temp 98.6°F | Ht 67.0 in | Wt 173.0 lb

## 2022-05-07 DIAGNOSIS — R911 Solitary pulmonary nodule: Secondary | ICD-10-CM

## 2022-05-07 DIAGNOSIS — Z87891 Personal history of nicotine dependence: Secondary | ICD-10-CM

## 2022-05-07 DIAGNOSIS — C3432 Malignant neoplasm of lower lobe, left bronchus or lung: Secondary | ICD-10-CM | POA: Diagnosis not present

## 2022-05-07 NOTE — Telephone Encounter (Signed)
I called and left a message for Respiratory at the hospital.

## 2022-05-07 NOTE — Progress Notes (Signed)
History of Present Illness Megan [name] is a 71 y.o. female  former smoker ( Quit with 2007 with a 53 pack year smoking history) with past medical history of reflux, pneumonia, arthritis.Patient had a abnormal lung cancer screening CT. She has been followed by Dr. Valeta Harms for follow up of abnormal lung nodule.   Synopsis 71 year old female followed by the lung cancer screening program for several years. Previous  lung cancer screening CT was on 07/01/2021 which was read as a lung RADS 4B.  Patient was found to have a left lower lobe 13.5 mm lung nodule concerning for malignancy.  Patient ultimately had a nuclear medicine PET scan that was ordered and completed on 07/20/2021.  The left lower lobe lesion had low-grade/low-level FDG uptake.  Concern for possibility of a low-grade neoplasm due to its low-level FDG uptake. She was taken for bronchoscopy and tissue biopsies on 09/15/2021. These  were negative for malignancy,  cultures with no growth Follow up plan was for 6 month follow up CT Chest . This was done 03/22/2022 which again showed a .Masslike subsolid lesion in the medial left lower lobe measures 6.1 x 3.8 cm with a 3.5 cm solid component, increased in size since 07/20/2021 chest CT. Findings remain worrisome for primary bronchogenic adenocarcinoma . PET scan was done 04/09/2022. This showed Increased size and FDG avidity of the now hypermetabolic subsolid left lower lobe pulmonary nodule, highly suspicious for primary bronchogenic neoplasm. She underwent a Flexible video fiberoptic bronchoscopy with robotic assistance and biopsies on 05/04/2022. She is here today to review the results.    05/07/2022 Pt. Presents for follow up after Flexible video fiberoptic bronchoscopy with robotic assistance and biopsies on 05/04/2022.  She states she  has been doing well post procedure. She does have a cough that is minimally productive with clear secretions . She did not have any hemoptysis post procedure. She  did have a sore throat the day of the procedure, but this self resolved by Wednesday, day after procedure.   We have reviewed her biopsy results. 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 We discussed these results at length. All questions that were asked have been answered. I have referred her to radiation oncology as well as thoracic surgery to be evaluated for radiation and also resection.She is in agreement with this plan.   Test Results: Biopsy 05/04/2022 Samples Target #1: 1. Transbronchial Wang needle biopsies from Left lower lobe 2. Transbronchial forceps biopsies from left lower lobe   Cytology 05/04/2022  LEFT LUNG, LOWER LOBE, NODULE, NEEDLE ASPIRATION AND BIOPSIES:  - Malignant  - Adenocarcinoma   Micro 05/04/2022 Acid Fast Smear Negative   Gram Stain was negative for growth  Fungus was negative for growth    Latest Ref Rng & Units 05/04/2022    5:46 AM 09/15/2021    9:41 AM 07/31/2021    4:00 PM  CBC  WBC 4.0 - 10.5 K/uL 6.2  7.4  5.5   Hemoglobin 12.0 - 15.0 g/dL 13.4  13.9  13.0   Hematocrit 36.0 - 46.0 % 39.6  41.9  38.9   Platelets 150 - 400 K/uL 190  197  208        Latest Ref Rng & Units 05/04/2022    5:46 AM 09/15/2021    9:41 AM 08/04/2021    3:06 PM  BMP  Glucose 70 - 99 mg/dL 162  172    BUN 8 - 23 mg/dL 14  14    Creatinine 0.44 - 1.00 mg/dL 0.97  0.91    Sodium 135 - 145 mmol/L 138  136    Potassium 3.5 - 5.1 mmol/L 4.4  3.8  4.8   Chloride 98 - 111 mmol/L 107  103    CO2 22 - 32 mmol/L 25  19    Calcium 8.9 - 10.3 mg/dL 8.7  9.3      BNP No results found for: "BNP"  ProBNP No results found for: "PROBNP"  PFT No results found for: "FEV1PRE", "FEV1POST", "FVCPRE", "FVCPOST", "TLC", "DLCOUNC", "PREFEV1FVCRT", "PSTFEV1FVCRT"  DG C-ARM BRONCHOSCOPY  Result Date: 05/04/2022 C-ARM BRONCHOSCOPY: Fluoroscopy was utilized by the requesting physician.  No radiographic interpretation.   DG CHEST PORT 1 VIEW  Result  Date: 05/04/2022 CLINICAL DATA:  Postop bronchoscopy EXAM: PORTABLE CHEST 1 VIEW COMPARISON:  09/15/2021 FINDINGS: Left lower lobe density unchanged. Possible mass lesion based on CT and PET. Surgical clips left base unchanged. No pneumothorax Right lung clear.  No heart failure or effusion. IMPRESSION: Left lower lobe density unchanged. Possible mass lesion. No pneumothorax. Electronically Signed   By: Franchot Gallo M.D.   On: 05/04/2022 09:33   NM PET Image Restage (PS) Skull Base to Thigh (F-18 FDG)  Result Date: 04/09/2022 CLINICAL DATA:  Initial treatment strategy for pulmonary nodule. EXAM: NUCLEAR MEDICINE PET SKULL BASE TO THIGH TECHNIQUE: 8.77 mCi F-18 FDG was injected intravenously. Full-ring PET imaging was performed from the skull base to thigh after the radiotracer. CT data was obtained and used for attenuation correction and anatomic localization. Fasting blood glucose: 103 mg/dl COMPARISON:  CT March 22, 2022 and PET-CT July 20, 2021 FINDINGS: Mediastinal blood pool activity: SUV max 2.6 Liver activity: SUV max NA NECK: No hypermetabolic cervical adenopathy. Incidental CT findings: none CHEST: Increased size and FDG avidity in the subsolid paramedian left lower lobe pulmonary nodule which now measures 3.9 x 1.8 cm on image 81/7 with the solid component measuring 16 mm on image 83/7 with a max SUV of 5.71 previously measuring 1.7 x 1.5 cm with a max SUV of 2.2. No hypermetabolic thoracic adenopathy. Incidental CT findings: Aortic atherosclerosis. ABDOMEN/PELVIS: No abnormal hypermetabolic activity within the liver, pancreas, adrenal glands, or spleen. No hypermetabolic lymph nodes in the abdomen or pelvis. Incidental CT findings: Large volume of formed stool throughout the colon. Colonic diverticulosis without findings of acute diverticulitis. Left-sided bladder wall diverticulum. Gas in the urinary bladder suggest correlation with recent instrumentation. Pelvic floor laxity. Aortic  atherosclerosis. SKELETON: No focal hypermetabolic activity to suggest skeletal metastasis. Incidental CT findings: Posterior spinal fusion hardware. IMPRESSION: 1. Increased size and FDG avidity of the now hypermetabolic subsolid left lower lobe pulmonary nodule, highly suspicious for primary bronchogenic neoplasm. 2. No hypermetabolic thoracic adenopathy or evidence of hypermetabolic metastatic disease. 3. Gas in the urinary bladder, suggest correlation with recent instrumentation. 4. Colonic diverticulosis without findings of acute diverticulitis. 5. Large volume of formed stool throughout the colon suggestive of constipation. 6.  Aortic Atherosclerosis (ICD10-I70.0). Electronically Signed   By: Dahlia Bailiff M.D.   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   Constipation due to pain medication    Debility    Dyspnea    W/ PHYS CONDITION    Emphysema of lung (  [place])    Foul smelling urine 07/22/2020   GERD (gastroesophageal reflux disease)    History of bronchitis    "not since I quit smoking" (07/26/2017)   History of kidney stones    History of shingles    Hyperlipidemia    takes Fish Oil daily   Hypertension    Hypoalbuminemia due to protein-calorie malnutrition ([place])    L3 vertebral fracture ([place]) 09/01/2017   Myelopathy ([place]) 12/21/2016   Neuromuscular disorder ([facility])    tingling toes   Paraparesis ([place]) 12/15/2016   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   Pneumonia 2009   PONV (postoperative nausea and vomiting)    Radiculopathy 07/28/2017   Restless leg    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 Her smoking use included cigarettes. She has a 52.50 pack-year smoking history. She has never used smokeless tobacco. She reports current alcohol use. She reports that she does not use drugs.  Tobacco Cessation: Former smoker with a 53 pack year smoking history. She quit smoking in 2007.   Past surgical hx, Family hx, Social hx all reviewed.  Current Outpatient Medications on File Prior to Visit  Medication Sig   atorvastatin (LIPITOR) 40 MG tablet Take 1 tablet (40 mg total) by mouth daily. For cholesterol.   baclofen (LIORESAL) 10 MG tablet TAKE 1 TABLET(10 MG) BY MOUTH THREE TIMES DAILY AS NEEDED FOR MUSCLE SPASMS (Patient taking differently: Take 10 mg by mouth 2 (two) times daily. TAKE 1 TABLET(10 MG) BY MOUTH THREE TIMES DAILY AS NEEDED FOR MUSCLE SPASMS)   Biotin 5000 MCG CAPS Take 5,000 mcg by mouth every morning.    celecoxib (CELEBREX) 200 MG capsule Take 1 capsule (200 mg total) by mouth daily.   cetirizine (ZYRTEC) 10 MG tablet Take 1 tablet (10 mg total) by mouth daily. For allergies   Cholecalciferol (VITAMIN D) 50 MCG (2000 UT) tablet Take 2,000 Units by mouth daily.   Coenzyme Q10 (COQ10 PO) Take 100 mg by mouth every evening.   esomeprazole (NEXIUM 24HR) 20 MG capsule Take 1 capsule (20 mg total) by mouth daily at 12 noon. (Patient taking differently: Take 20 mg by mouth daily.)   fluocinonide cream (LIDEX) 7.42 % Apply 1 Application topically 2 (two) times daily as needed (irritation).   gabapentin  (NEURONTIN) 300 MG capsule Take 1 capsule (300 mg total) by mouth 2 (two) times daily. For pain.   ibuprofen (ADVIL) 200 MG tablet Take 400-600 mg by mouth every 8 (eight) hours as needed (pain).   lisinopril (ZESTRIL) 20 MG tablet TAKE 1 TABLET(20 MG) BY MOUTH DAILY FOR BLOOD PRESSURE   metFORMIN (GLUCOPHAGE) 500 MG tablet TAKE 1 TABLET BY MOUTH TWICE DAILY FOR  DIABETES   nystatin-triamcinolone ointment (MYCOLOG) Apply 1 Application topically 2 (two) times daily as needed (irritation).   oxybutynin (DITROPAN-XL) 5 MG 24 hr tablet TAKE 1 TABLET(5 MG) BY MOUTH AT BEDTIME FOR OVERACTIVE BLADDER   oxybutynin (OXYTROL) 3.9 MG/24HR Place 1 patch onto the skin See admin instructions. Change every 5 days   Polyethyl Glycol-Propyl Glycol (LUBRICANT EYE DROPS) 0.4-0.3 % SOLN Place 1-2 drops into both eyes 3 (three) times daily as needed (dry/irritated eyes.).   senna-docusate (SENOKOT-S) 8.6-50 MG tablet Take 2 tablets by mouth 2 (two) times daily. (Patient taking differently: Take 1 tablet by mouth every evening.)   terbinafine (LAMISIL) 250 MG tablet Take 1 tablet (250 mg total) by mouth daily.   No current facility-administered medications on file prior to visit.     Allergies  Allergen Reactions   Shellfish Allergy Anaphylaxis, Swelling and Other (See Comments)    Tongue swells   Oxycodone Nausea Only   Penicillins Rash and Other (See Comments)    Has patient had a PCN reaction causing immediate rash, facial/tongue/throat swelling, SOB or lightheadedness with hypotension: Yes Has patient had a PCN reaction causing severe rash involving mucus membranes or skin necrosis: No Has patient had a PCN reaction that required hospitalization No Has patient had a PCN reaction occurring within the last 10 years: No If all of the above answers are "NO", then may proceed with Cephalosporin use.    Review Of Systems:  Constitutional:   No  weight loss, night sweats,  Fevers, chills, fatigue, or   lassitude.  HEENT:   No headaches,  Difficulty swallowing,  Tooth/dental problems, or  Sore throat,                No sneezing, itching, ear ache, nasal congestion, post nasal drip,   CV:  No chest pain,  Orthopnea, PND, swelling in lower extremities, anasarca, dizziness, palpitations, syncope.   GI  No heartburn, indigestion, abdominal pain, nausea, vomiting, diarrhea, change in bowel habits, loss of appetite, bloody stools.   Resp: No shortness of breath with exertion or at rest.  No excess mucus, no productive cough,  No non-productive cough,  No coughing up of blood.  No change in color of mucus.  No wheezing.  No chest wall deformity  Skin: no rash or lesions.  GU: no dysuria, change in color of urine, no urgency or frequency.  No flank pain, no hematuria   MS:  No joint pain or swelling.  No decreased range of motion.  No back pain.  Psych:  No change in mood or affect. No depression or anxiety.  No memory loss.   Vital Signs BP 138/78 (BP Location: Left Arm, Patient Position: Sitting, Cuff Size: Normal)   Pulse 78   Temp 98.6 F (37 C) (Oral)   Ht 5\' 7"  (1.702 m)   Wt 173 lb (78.5 kg)   SpO2 97%   BMI 27.10 kg/m    Physical Exam:  General- No distress,  A&Ox3, pleasant ENT: No sinus tenderness, TM clear, pale nasal mucosa, no oral exudate,no post nasal drip, no LAN Cardiac: S1, S2, regular rate and rhythm, no murmur Chest: No wheeze/ rales/ dullness; no accessory muscle use, no nasal flaring, no sternal retractions, diminished per bases. Abd.: Soft Non-tender, ND, BS +, Body mass index is 27.1 kg/m.  Ext: No clubbing cyanosis, trace BLE edema Neuro: Physically deconditioned , MAE x 4, A&O x 3, in wheelchair  Skin: No rashes, warm and dry, no lesions  Psych: normal mood and behavior   Assessment/Plan New diagnosis Adenocarcinoma LLL Former smoker quit 2007 Plan We have placed orders for referral to Thoracic Surgery You will get a call to get this appointment  scheduled.  We have placed a referral to Radiation oncology. You will get a call to get this scheduled.  We have placed an order for PFT's ( Pulmonary Function Tests)  You will get a call to get these scheduled.  Call if you have any questions. You will be well cared for by the cancer center staff.  Please contact office for sooner follow up if symptoms do not improve or worsen or seek emergency care     I spent 40 minutes dedicated to the care of this patient on the date of this encounter to include pre-visit review of records, face-to-face time with the patient discussing conditions above, post visit ordering of testing, clinical documentation with the electronic health record, making appropriate referrals as documented, and communicating necessary information to the patient's healthcare team.      Magdalen Spatz, NP 05/07/2022  8:59 PM  Addendum As I complete this note I see in appointments patient is scheduled to see  both Dr. Sondra Come and his NP  for consult with radiation oncology on 8/29, and she is scheduled to see Dr. Kipp Brood for surgical consult 9/8.

## 2022-05-07 NOTE — Telephone Encounter (Signed)
Pt needs to be scheduled in first available PFT at either [facility] or [facility]. Thank you

## 2022-05-07 NOTE — Patient Instructions (Addendum)
It is good to see you today. We have placed orders for referral to Thoracic Surgery You will get a call to get this appointment scheduled.  We have placed a referral to Radiation oncology. You will get a call to get this scheduled.  We have placed an order for PFT's ( Pulmonary Function Tests)  You will get a call to get these scheduled.  Call if you have any questions. You will be well cared for by the cancer center staff.  Please contact office for sooner follow up if symptoms do not improve or worsen or seek emergency care

## 2022-05-09 LAB — AEROBIC/ANAEROBIC CULTURE W GRAM STAIN (SURGICAL/DEEP WOUND)
Culture: NO GROWTH
Gram Stain: NONE SEEN

## 2022-05-11 NOTE — Telephone Encounter (Signed)
Patient has been scheduled for Friday. Nothing further needed at this time.

## 2022-05-11 NOTE — Progress Notes (Signed)
Thoracic Location of Tumor / Histology: left lower lobe adenocarcinoma  Patient presented with an abnormal PET scan.  Biopsies revealed:   05/04/2022 FINAL MICROSCOPIC DIAGNOSIS:  A.  LEFT LUNG, LOWER LOBE, NODULE, NEEDLE ASPIRATION AND BIOPSIES:  - Malignant  - Adenocarcinoma   Tobacco/Marijuana/Snuff/ETOH use: {:18581}  Past/Anticipated interventions by cardiothoracic surgery, if any: Has an apt with Dr. Kipp Brood 05/21/22  Past/Anticipated interventions by medical oncology, if any: {:18581}  Signs/Symptoms Weight changes, if any: {:18581} Respiratory complaints, if any: {:18581} Hemoptysis, if any: {:18581} Pain issues, if any:  {:18581}  SAFETY ISSUES: Prior radiation? {:18581} Pacemaker/ICD? {:18581}  Possible current pregnancy?no Is the patient on methotrexate? {:18581}  Current Complaints / other details:  ***

## 2022-05-12 NOTE — Progress Notes (Signed)
Radiation Oncology         [phone] ________________________________  Initial Outpatient Consultation  Name: Megan [name] MRN: [id number]  Date: 05/13/2022  DOB: [birth year]  GG:YIRSW, Leticia Penna, NP  Garner Nash, DO   REFERRING PHYSICIAN: Garner Nash, DO  DIAGNOSIS: There were no encounter diagnoses.  Adenocarcinoma of the left lower lobe (presented with enlarging medial left lower lobe nodule)  HISTORY OF PRESENT ILLNESS::Megan [name] is a 71 y.o. female who is accompanied by ***. she is seen as a courtesy of Dr. Valeta Harms for an opinion concerning radiation therapy as part of management for her recently diagnosed left lung cancer. The patient participates in the lung cancer screening program and has been followed by Eric Form NP at [facility] for quite some time now.   The patient initially presented for a lung cancer screening CT on 07/01/21 which showed enlargement of a medial left lower lobe nodule (Lung-RADS 4B) from 13.5 mm to 17.2 mm when compared to screening CT from 12/15/20.   PET on 07/20/21 showed the medial left lower lobe lesion with low level FDG uptake. PET otherwise showed no other areas of unexpected or suspicious hypermetabolism.   Accordingly, the patient was referred to pulmonology and proceeded to undergo bronchoscopy with Dr. Valeta Harms on 09/15/21. Biopsies of the LLL collected showed no evidence of malignancy. Cultures collected also showed no growth.   Given negative findings, the patient continued on surveillance with repeat imaging in 6 months.   6 month follow-up super-D chest CT on 03/22/22 showed an interval increase in size of the medial left lower lobe lesion, measuring 6.1 x 3.8 cm with a 3.5 cm solid component (previously 4.1 x 2.8 cm with 2.5 cm solid component). CT otherwise showed no evidence of thoracic adenopathy, or other findings suspicious for metastatic disease in the chest.  Restaging PET scan on 04/09/22 further  revealed the medial left lower nodule as hypermetabolic (max SUV of 5.46), and thus concerning for primary bronchogenic neoplasm (PET also redemonstrated an increase in size and FDG avidity of the nodule). Otherwise, no hypermetabolic thoracic adenopathy or evidence of hypermetabolic metastatic disease were appreciated.    Subsequently, the patient was referred back to Dr. Valeta Harms and underwent repeat LLL biopsies on 05/04/22. Final pathology showed findings consistent with adenocarcinoma.    Accordingly, the patient followed up with Eric Form NP who placed referrals to myself and thoracic surgery to be evaluated for radiation and resection.   Of note: the patient had a recent injury to the right foot from a fall and weakness in the right leg from a prior spinal surgery. At baseline, she has difficulty ambulating and requires a walker or wheelchair/motorized scooter.  The patient is scheduled to meet with Dr. Kipp Brood on 05/21/22. She has also been scheduled for PFT's.    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   Constipation due to pain medication    Debility    Dyspnea    W/ PHYS CONDITION    Emphysema of lung (HCC)    Foul smelling urine 07/22/2020   GERD (gastroesophageal reflux disease)    History of bronchitis    "not since I quit smoking" (07/26/2017)   History of kidney stones    History of  shingles    Hyperlipidemia    takes Fish Oil daily   Hypertension    Hypoalbuminemia due to protein-calorie malnutrition ([place])    L3 vertebral fracture ([place]) 09/01/2017   Myelopathy ([place]) 12/21/2016   Neuromuscular disorder (HCC)    tingling toes   Paraparesis ([place]) 12/15/2016   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   Pneumonia 2009   PONV  (postoperative nausea and vomiting)    Radiculopathy 07/28/2017   Restless leg    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 Deitra Mayo for approach;  Surgeon: Ditty, Kevan Ny, MD;  Location: [place];  Service: Neurosurgery;  Laterality: N/A;   APPLICATION OF ROBOTIC ASSISTANCE FOR SPINAL PROCEDURE  12/19/2016   Procedure: APPLICATION OF ROBOTIC ASSISTANCE FOR SPINAL PROCEDURE;  Surgeon: Kevan Ny Ditty, MD;  Location: [place];  Service: Neurosurgery;;   APPLICATION OF ROBOTIC ASSISTANCE FOR SPINAL PROCEDURE N/A 07/26/2017   Procedure: APPLICATION OF ROBOTIC ASSISTANCE FOR SPINAL PROCEDURE;  Surgeon: Ditty, Kevan Ny, MD;  Location: [place];  Service: Neurosurgery;  Laterality: N/A;   APPLICATION OF ROBOTIC ASSISTANCE FOR SPINAL PROCEDURE N/A 09/02/2017   Procedure: APPLICATION OF ROBOTIC ASSISTANCE FOR SPINAL PROCEDURE;  Surgeon: Ditty, Kevan Ny, MD;  Location: [place];  Service: Neurosurgery;  Laterality: N/A;   BACK SURGERY     BLADDER SUSPENSION  1991   tack   BRONCHIAL BIOPSY  09/15/2021   Procedure: BRONCHIAL BIOPSIES;  Surgeon: Garner Nash, DO;  Location: [place];  Service: Pulmonary;;   BRONCHIAL BIOPSY  05/04/2022   Procedure: BRONCHIAL BIOPSIES;  Surgeon: Garner Nash, DO;  Location: [place] [facility];  Service: Pulmonary;;   BRONCHIAL BRUSHINGS   09/15/2021   Procedure: BRONCHIAL BRUSHINGS;  Surgeon: Garner Nash, DO;  Location: [place];  Service: Pulmonary;;   BRONCHIAL NEEDLE ASPIRATION BIOPSY  09/15/2021   Procedure: BRONCHIAL NEEDLE ASPIRATION BIOPSIES;  Surgeon: Garner Nash, DO;  Location: [place];  Service: Pulmonary;;   BRONCHIAL NEEDLE ASPIRATION BIOPSY  05/04/2022   Procedure: BRONCHIAL NEEDLE ASPIRATION BIOPSIES;  Surgeon: Garner Nash, DO;  Location: [place];  Service: Pulmonary;;   BRONCHIAL WASHINGS  09/15/2021   Procedure: BRONCHIAL WASHINGS;  Surgeon: Garner Nash, DO;  Location: [place] [facility];  Service: Pulmonary;;   FIDUCIAL MARKER PLACEMENT  09/15/2021   Procedure: FIDUCIAL MARKER PLACEMENT;  Surgeon: Garner Nash, DO;  Location: [place] [facility];  Service: Pulmonary;;   INGUINAL HERNIA REPAIR Right 1991   LUMBAR FUSION  2016; 2017; 07/26/2017   L4-5; L2-3; L5-S1   POSTERIOR LUMBAR FUSION 4 WITH HARDWARE REMOVAL N/A 09/02/2017   Procedure: Lumbar three-four redo laminectomy; Repositioning of Left Sacral two screw; Extension of lumbar fusion to Thoracic twelve;  Surgeon: Ditty, Kevan Ny, MD;  Location: [place];  Service: Neurosurgery;  Laterality: N/A;   TONSILLECTOMY AND ADENOIDECTOMY  1959   TUMOR EXCISION  WERTHIN'S TUMORS BOTH SIDES OF NECK   VAGINAL HYSTERECTOMY  1979   VIDEO BRONCHOSCOPY WITH RADIAL ENDOBRONCHIAL ULTRASOUND  09/15/2021   Procedure: VIDEO BRONCHOSCOPY WITH RADIAL ENDOBRONCHIAL ULTRASOUND;  Surgeon: Garner Nash, DO;  Location: [place] [facility];  Service: Pulmonary;;   VIDEO BRONCHOSCOPY WITH RADIAL ENDOBRONCHIAL ULTRASOUND  05/04/2022   Procedure: VIDEO BRONCHOSCOPY WITH RADIAL ENDOBRONCHIAL ULTRASOUND;  Surgeon: Garner Nash, DO;  Location: [facility];  Service: Pulmonary;;    FAMILY HISTORY:  Family History  Problem Relation Age of Onset   Diabetes Mother    Dementia Mother    Cirrhosis Mother        Non alcoholic   COPD Father    Diabetes Brother      SOCIAL HISTORY:  Social History   Tobacco Use   Smoking status: Former    Packs/day: 1.50    Years: 35.00    Total pack years: 52.50    Types: Cigarettes    Quit date: 05/13/2006    Years since quitting: 16.0   Smokeless tobacco: Never  Vaping Use   Vaping Use: Never used  Substance Use Topics   Alcohol use: Yes    Comment: occasional   Drug use: No    ALLERGIES:  Allergies  Allergen Reactions   Shellfish Allergy Anaphylaxis, Swelling and Other (See Comments)    Tongue swells   Oxycodone Nausea Only   Penicillins Rash and Other (See Comments)    Has patient had a PCN reaction causing immediate rash, facial/tongue/throat swelling, SOB or lightheadedness with hypotension: Yes Has patient had a PCN reaction causing severe rash involving mucus membranes or skin necrosis: No Has patient had a PCN reaction that required hospitalization No Has patient had a PCN reaction occurring within the last 10 years: No If all of the above answers are "NO", then may proceed with Cephalosporin use.    MEDICATIONS:  Current Outpatient Medications  Medication Sig Dispense Refill   atorvastatin (LIPITOR) 40 MG tablet Take 1 tablet (40 mg total) by mouth daily. For cholesterol. 90 tablet 3   baclofen (LIORESAL) 10 MG tablet TAKE 1 TABLET(10 MG) BY MOUTH THREE TIMES DAILY AS NEEDED FOR MUSCLE SPASMS (Patient taking differently: Take 10 mg by mouth 2 (two) times daily. TAKE 1 TABLET(10 MG) BY MOUTH THREE TIMES DAILY AS NEEDED FOR MUSCLE SPASMS) 180 tablet 3   Biotin 5000 MCG CAPS Take 5,000 mcg by mouth every morning.      celecoxib (CELEBREX) 200 MG capsule Take 1 capsule (200 mg total) by mouth daily. 24 capsule 0   cetirizine (ZYRTEC) 10 MG tablet Take 1 tablet (10 mg total) by mouth daily. For allergies 90 tablet 0   Cholecalciferol (VITAMIN D) 50 MCG (2000 UT) tablet Take 2,000 Units by mouth daily.     Coenzyme Q10 (COQ10 PO) Take 100 mg by mouth every evening.     esomeprazole (NEXIUM  24HR) 20 MG capsule Take 1 capsule (20 mg total) by mouth daily at 12 noon. (Patient taking differently: Take 20 mg by mouth daily.) 90 capsule 1   fluocinonide cream (LIDEX) 2.99 % Apply 1 Application topically 2 (two) times daily as needed (irritation).     gabapentin (NEURONTIN) 300 MG capsule Take 1 capsule (300 mg total) by mouth 2 (two) times daily. For pain. 180 capsule 3   ibuprofen (ADVIL) 200 MG tablet Take 400-600 mg by mouth every 8 (eight) hours as needed (pain).     lisinopril (ZESTRIL) 20 MG tablet TAKE 1 TABLET(20  MG) BY MOUTH DAILY FOR BLOOD PRESSURE 90 tablet 1   metFORMIN (GLUCOPHAGE) 500 MG tablet TAKE 1 TABLET BY MOUTH TWICE DAILY FOR DIABETES 180 tablet 1   nystatin-triamcinolone ointment (MYCOLOG) Apply 1 Application topically 2 (two) times daily as needed (irritation).     oxybutynin (DITROPAN-XL) 5 MG 24 hr tablet TAKE 1 TABLET(5 MG) BY MOUTH AT BEDTIME FOR OVERACTIVE BLADDER 90 tablet 2   oxybutynin (OXYTROL) 3.9 MG/24HR Place 1 patch onto the skin See admin instructions. Change every 5 days     Polyethyl Glycol-Propyl Glycol (LUBRICANT EYE DROPS) 0.4-0.3 % SOLN Place 1-2 drops into both eyes 3 (three) times daily as needed (dry/irritated eyes.).     senna-docusate (SENOKOT-S) 8.6-50 MG tablet Take 2 tablets by mouth 2 (two) times daily. (Patient taking differently: Take 1 tablet by mouth every evening.)     terbinafine (LAMISIL) 250 MG tablet Take 1 tablet (250 mg total) by mouth daily. 30 tablet 1   No current facility-administered medications for this encounter.    REVIEW OF SYSTEMS:  A 10+ POINT REVIEW OF SYSTEMS WAS OBTAINED including neurology, dermatology, psychiatry, cardiac, respiratory, lymph, extremities, GI, GU, musculoskeletal, constitutional, reproductive, HEENT. ***   PHYSICAL EXAM:  vitals were not taken for this visit.   General: Alert and oriented, in no acute distress HEENT: Head is normocephalic. Extraocular movements are intact. Oropharynx is  clear. Neck: Neck is supple, no palpable cervical or supraclavicular lymphadenopathy. Heart: Regular in rate and rhythm with no murmurs, rubs, or gallops. Chest: Clear to auscultation bilaterally, with no rhonchi, wheezes, or rales. Abdomen: Soft, nontender, nondistended, with no rigidity or guarding. Extremities: No cyanosis or edema. Lymphatics: see Neck Exam Skin: No concerning lesions. Musculoskeletal: symmetric strength and muscle tone throughout. Neurologic: Cranial nerves II through XII are grossly intact. No obvious focalities. Speech is fluent. Coordination is intact. Psychiatric: Judgment and insight are intact. Affect is appropriate. ***  ECOG = ***  0 - Asymptomatic (Fully active, able to carry on all predisease activities without restriction)  1 - Symptomatic but completely ambulatory (Restricted in physically strenuous activity but ambulatory and able to carry out work of a light or sedentary nature. For example, light housework, office work)  2 - Symptomatic, <50% in bed during the day (Ambulatory and capable of all self care but unable to carry out any work activities. Up and about more than 50% of waking hours)  3 - Symptomatic, >50% in bed, but not bedbound (Capable of only limited self-care, confined to bed or chair 50% or more of waking hours)  4 - Bedbound (Completely disabled. Cannot carry on any self-care. Totally confined to bed or chair)  5 - Death   Eustace Pen MM, Creech RH, Tormey DC, et al. [phone]). "Toxicity and response criteria of the [facility]". [place] Oncol. 5 (6): 649-55  LABORATORY DATA:  Lab Results  Component Value Date   WBC 6.2 05/04/2022   HGB 13.4 05/04/2022   HCT 39.6 05/04/2022   MCV 89.0 05/04/2022   PLT 190 05/04/2022   NEUTROABS 4.0 09/08/2017   Lab Results  Component Value Date   NA 138 05/04/2022   K 4.4 05/04/2022   CL 107 05/04/2022   CO2 25 05/04/2022   GLUCOSE 162 (H) 05/04/2022   BUN 14  05/04/2022   CREATININE 0.97 05/04/2022   CALCIUM 8.7 (L) 05/04/2022      RADIOGRAPHY: DG C-ARM BRONCHOSCOPY  Result Date: 05/04/2022 C-ARM BRONCHOSCOPY: Fluoroscopy was utilized by the requesting  physician.  No radiographic interpretation.   DG CHEST PORT 1 VIEW  Result Date: 05/04/2022 CLINICAL DATA:  Postop bronchoscopy EXAM: PORTABLE CHEST 1 VIEW COMPARISON:  09/15/2021 FINDINGS: Left lower lobe density unchanged. Possible mass lesion based on CT and PET. Surgical clips left base unchanged. No pneumothorax Right lung clear.  No heart failure or effusion. IMPRESSION: Left lower lobe density unchanged. Possible mass lesion. No pneumothorax. Electronically Signed   By: Franchot Gallo M.D.   On: 05/04/2022 09:33      IMPRESSION: Adenocarcinoma of the left lower lobe (presented with enlarging medial left lower lobe nodule)  ***  Today, I talked to the patient and family about the findings and work-up thus far.  We discussed the natural history of *** and general treatment, highlighting the role of radiotherapy in the management.  We discussed the available radiation techniques, and focused on the details of logistics and delivery.  We reviewed the anticipated acute and late sequelae associated with radiation in this setting.  The patient was encouraged to ask questions that I answered to the best of my ability. *** A patient consent form was discussed and signed.  We retained a copy for our records.  The patient would like to proceed with radiation and will be scheduled for CT simulation.  PLAN: ***    *** minutes of total time was spent for this patient encounter, including preparation, face-to-face counseling with the patient and coordination of care, physical exam, and documentation of the encounter.   ------------------------------------------------  Blair Promise, PhD, MD  This document serves as a record of services personally performed by Gery Pray, MD. It was created on his  behalf by Roney Mans, a trained medical scribe. The creation of this record is based on the scribe's personal observations and the provider's statements to them. This document has been checked and approved by the attending provider.

## 2022-05-13 ENCOUNTER — Ambulatory Visit
Admission: RE | Admit: 2022-05-13 | Discharge: 2022-05-13 | Disposition: A | Discharge status: Home / Self Care | Payer: PPO | Source: Ambulatory Visit | Attending: Radiation Oncology | Admitting: Radiation Oncology

## 2022-05-13 ENCOUNTER — Other Ambulatory Visit: Payer: Self-pay

## 2022-05-13 ENCOUNTER — Encounter (HOSPITAL_COMMUNITY): Payer: BLUE CROSS/BLUE SHIELD

## 2022-05-13 ENCOUNTER — Encounter: Payer: Self-pay | Admitting: Radiation Oncology

## 2022-05-13 ENCOUNTER — Telehealth: Payer: Self-pay | Admitting: Primary Care

## 2022-05-13 VITALS — BP 170/89 | HR 81 | Temp 97.8°F | Resp 20 | Ht 67.0 in | Wt 174.4 lb

## 2022-05-13 DIAGNOSIS — Z7984 Long term (current) use of oral hypoglycemic drugs: Secondary | ICD-10-CM | POA: Diagnosis not present

## 2022-05-13 DIAGNOSIS — C3432 Malignant neoplasm of lower lobe, left bronchus or lung: Secondary | ICD-10-CM

## 2022-05-13 DIAGNOSIS — I10 Essential (primary) hypertension: Secondary | ICD-10-CM | POA: Insufficient documentation

## 2022-05-13 DIAGNOSIS — E785 Hyperlipidemia, unspecified: Secondary | ICD-10-CM | POA: Insufficient documentation

## 2022-05-13 DIAGNOSIS — G2581 Restless legs syndrome: Secondary | ICD-10-CM | POA: Insufficient documentation

## 2022-05-13 DIAGNOSIS — Z791 Long term (current) use of non-steroidal anti-inflammatories (NSAID): Secondary | ICD-10-CM | POA: Diagnosis not present

## 2022-05-13 DIAGNOSIS — Z79899 Other long term (current) drug therapy: Secondary | ICD-10-CM | POA: Insufficient documentation

## 2022-05-13 DIAGNOSIS — Z87442 Personal history of urinary calculi: Secondary | ICD-10-CM | POA: Insufficient documentation

## 2022-05-13 DIAGNOSIS — R911 Solitary pulmonary nodule: Secondary | ICD-10-CM

## 2022-05-13 DIAGNOSIS — Z87891 Personal history of nicotine dependence: Secondary | ICD-10-CM | POA: Diagnosis not present

## 2022-05-13 DIAGNOSIS — E119 Type 2 diabetes mellitus without complications: Secondary | ICD-10-CM | POA: Diagnosis not present

## 2022-05-13 DIAGNOSIS — R262 Difficulty in walking, not elsewhere classified: Secondary | ICD-10-CM | POA: Diagnosis not present

## 2022-05-13 DIAGNOSIS — J439 Emphysema, unspecified: Secondary | ICD-10-CM | POA: Insufficient documentation

## 2022-05-13 DIAGNOSIS — K219 Gastro-esophageal reflux disease without esophagitis: Secondary | ICD-10-CM | POA: Insufficient documentation

## 2022-05-13 DIAGNOSIS — Q762 Congenital spondylolisthesis: Secondary | ICD-10-CM | POA: Diagnosis not present

## 2022-05-13 DIAGNOSIS — B351 Tinea unguium: Secondary | ICD-10-CM

## 2022-05-13 MED ORDER — TERBINAFINE HCL 250 MG PO TABS: 250.0000 mg | ORAL_TABLET | Freq: Every day | ORAL | 0 refills | Status: AC

## 2022-05-13 MED ORDER — CELECOXIB 200 MG PO CAPS: 200.0000 mg | ORAL_CAPSULE | Freq: Every day | ORAL | 0 refills | Status: DC

## 2022-05-13 NOTE — Telephone Encounter (Signed)
Noted regarding Celebrex. This will be her last refill of Lamisil for now, she will need re-evaluation for additional refills.

## 2022-05-13 NOTE — Telephone Encounter (Signed)
Spoke with Ms. Laubach.  I was suppose to have sent in #24 of Lamisil 250 mg due to her insurance will only cover #84 in a 180 day period and patient had already been prescribed #60.  Apparently I accidentally sent in Celebrex instead.  I went ahead and sent in the Lamisil that was originally suppose to be sent in.  I ask patient to call us back when she uses the #24 capsules of Celebrex and we can send in a 90 day supply then because since she picked up the Celebrex, insurance will not cover a 90 day supply now.  Patient states understanding and will call the pharmacy when she needs Celebrex refill.  I did correct quantity on her Celebrex so future refills will be #90 capsules.  FYI to Kate/Dr. Lorelei Pont.

## 2022-05-13 NOTE — Telephone Encounter (Signed)
Patient called in and had questions regarding her celecoxib (CELEBREX) 200 MG capsule. She stated she usually get a 90 day supply but just received 24 pills. She stated she should have had a refill of Terbinafine and not sure if they got the prescriptions missed up. Please advise. Thank you!

## 2022-05-14 ENCOUNTER — Other Ambulatory Visit: Payer: Self-pay | Admitting: *Deleted

## 2022-05-14 ENCOUNTER — Ambulatory Visit (HOSPITAL_COMMUNITY)
Admission: RE | Admit: 2022-05-14 | Discharge: 2022-05-14 | Disposition: A | Discharge status: Home / Self Care | Payer: PPO | Source: Ambulatory Visit | Attending: Acute Care | Admitting: Acute Care

## 2022-05-14 DIAGNOSIS — R059 Cough, unspecified: Secondary | ICD-10-CM | POA: Insufficient documentation

## 2022-05-14 DIAGNOSIS — Z87891 Personal history of nicotine dependence: Secondary | ICD-10-CM | POA: Insufficient documentation

## 2022-05-14 DIAGNOSIS — R911 Solitary pulmonary nodule: Secondary | ICD-10-CM

## 2022-05-14 DIAGNOSIS — J988 Other specified respiratory disorders: Secondary | ICD-10-CM | POA: Diagnosis not present

## 2022-05-14 LAB — PULMONARY FUNCTION TEST
DL/VA % pred: 86 %
DL/VA: 3.52 ml/min/mmHg/L
DLCO cor % pred: 74 %
DLCO cor: 16.14 ml/min/mmHg
DLCO unc % pred: 74 %
DLCO unc: 16.14 ml/min/mmHg
FEF 25-75 Post: 1.42 L/sec
FEF 25-75 Pre: 1.43 L/sec
FEF2575-%Change-Post: 0 %
FEF2575-%Pred-Post: 66 %
FEF2575-%Pred-Pre: 67 %
FEV1-%Change-Post: 1 %
FEV1-%Pred-Post: 77 %
FEV1-%Pred-Pre: 76 %
FEV1-Post: 2.01 L
FEV1-Pre: 1.99 L
FEV1FVC-%Change-Post: 0 %
FEV1FVC-%Pred-Pre: 93 %
FEV6-%Change-Post: 2 %
FEV6-%Pred-Post: 85 %
FEV6-%Pred-Pre: 83 %
FEV6-Post: 2.78 L
FEV6-Pre: 2.72 L
FEV6FVC-%Change-Post: 1 %
FEV6FVC-%Pred-Post: 103 %
FEV6FVC-%Pred-Pre: 102 %
FVC-%Change-Post: 0 %
FVC-%Pred-Post: 82 %
FVC-%Pred-Pre: 81 %
FVC-Post: 2.8 L
FVC-Pre: 2.78 L
Post FEV1/FVC ratio: 72 %
Post FEV6/FVC ratio: 99 %
Pre FEV1/FVC ratio: 72 %
Pre FEV6/FVC Ratio: 98 %
RV % pred: 96 %
RV: 2.23 L
TLC % pred: 91 %
TLC: 5.02 L

## 2022-05-14 MED ORDER — ALBUTEROL SULFATE (2.5 MG/3ML) 0.083% IN NEBU
2.5000 mg | INHALATION_SOLUTION | Freq: Once | RESPIRATORY_TRACT | Status: AC
  Administered 2022-05-14: 2.5 mg via RESPIRATORY_TRACT

## 2022-05-14 NOTE — Progress Notes (Signed)
The proposed treatment discussed in conference is for discussion purpose only and is not a binding recommendation.  The patients have not been physically examined, or presented with their treatment options.  Therefore, final treatment plans cannot be decided.  

## 2022-05-18 NOTE — H&P (View-Only) (Signed)
[address]       [place] [address]             [phone]                    [address] [address] [facility] Medical Record #[id number] Date of Birth: Feb 03, 1951  Referring: Magdalen Spatz, NP Primary Care: Pleas Koch, NP Primary Cardiologist: None  Chief Complaint:    Chief Complaint  Patient presents with   Lung Lesion    Bronch 8/22, PET 7/28, CT 7/10, PFTs 9/1    History of Present Illness:    Megan [name] 71 y.o. female presents for surgical evaluation of a 3.9 cm left lower lobe biopsy-proven non-small cell lung cancer.  She has a history of significant smoking and was originally screened in our lung cancer program.  She subsequently underwent a biopsy with Dr. Valeta Harms.  She denies any neurologic symptoms outside of those related to her previous spinal surgeries.  Her weight has been stable.  She is able to ambulate short distances with a walker.  Smoking Hx: Quit in 2007   Zubrod Score: At the time of surgery this patient's most appropriate activity status/level should be described as: []     0    Normal activity, no symptoms []     1    Restricted in physical strenuous activity but ambulatory, able to do out light work [x]     2    Ambulatory and capable of self care, unable to do work activities, up and about               >50 % of waking hours                              []     3    Only limited self care, in bed greater than 50% of waking hours []     4    Completely disabled, no self care, confined to bed or chair []     5    Moribund   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   Constipation due to pain medication    Debility    Dyspnea    W/ PHYS CONDITION    Emphysema of lung ([facility])    Foul smelling urine 07/22/2020   GERD (gastroesophageal reflux  disease)    History of bronchitis    "not since I quit smoking" (07/26/2017)   History of kidney stones    History of shingles    Hyperlipidemia    takes Fish Oil daily   Hypertension    Hypoalbuminemia due to protein-calorie malnutrition ([facility])    L3 vertebral fracture ([facility]) 09/01/2017   Myelopathy ([facility]) 12/21/2016   Neuromuscular disorder ([facility])    tingling toes   Paraparesis ([facility]) 12/15/2016   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[facility]) 12/15/2016   Pneumonia 2009   PONV (postoperative nausea and vomiting)    Radiculopathy 07/28/2017   Restless leg    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 Deitra Mayo for approach;  Surgeon: Ditty, Kevan Ny, MD;  Location: [facility];  Service: Neurosurgery;  Laterality: N/A;   APPLICATION OF ROBOTIC ASSISTANCE FOR SPINAL PROCEDURE  12/19/2016   Procedure: APPLICATION OF ROBOTIC ASSISTANCE FOR SPINAL PROCEDURE;  Surgeon: Kevan Ny Ditty, MD;  Location: [facility];  Service: Neurosurgery;;   APPLICATION OF ROBOTIC ASSISTANCE FOR SPINAL PROCEDURE N/A 07/26/2017   Procedure: APPLICATION OF ROBOTIC ASSISTANCE FOR SPINAL PROCEDURE;  Surgeon: Ditty, Kevan Ny, MD;  Location: [facility];  Service: Neurosurgery;  Laterality: N/A;   APPLICATION OF ROBOTIC ASSISTANCE FOR SPINAL PROCEDURE N/A 09/02/2017   Procedure: APPLICATION OF ROBOTIC ASSISTANCE FOR SPINAL  PROCEDURE;  Surgeon: Ditty, Kevan Ny, MD;  Location: [facility];  Service: Neurosurgery;  Laterality: N/A;   BACK SURGERY     BLADDER SUSPENSION  1991   tack   BRONCHIAL BIOPSY  09/15/2021   Procedure: BRONCHIAL BIOPSIES;  Surgeon: Garner Nash, DO;  Location: [facility];  Service: Pulmonary;;   BRONCHIAL BIOPSY  05/04/2022   Procedure: BRONCHIAL BIOPSIES;  Surgeon: Garner Nash, DO;  Location: [facility];  Service: Pulmonary;;   BRONCHIAL BRUSHINGS  09/15/2021   Procedure: BRONCHIAL BRUSHINGS;  Surgeon: Garner Nash, DO;  Location: [facility];  Service: Pulmonary;;   BRONCHIAL NEEDLE ASPIRATION BIOPSY  09/15/2021   Procedure: BRONCHIAL NEEDLE ASPIRATION BIOPSIES;  Surgeon: Garner Nash, DO;  Location: [facility];  Service: Pulmonary;;   BRONCHIAL NEEDLE ASPIRATION BIOPSY  05/04/2022   Procedure: BRONCHIAL NEEDLE ASPIRATION BIOPSIES;  Surgeon: Garner Nash, DO;  Location: [facility];  Service: Pulmonary;;   BRONCHIAL WASHINGS  09/15/2021   Procedure: BRONCHIAL WASHINGS;  Surgeon: Garner Nash, DO;  Location: [facility];  Service: Pulmonary;;   FIDUCIAL MARKER PLACEMENT  09/15/2021   Procedure: FIDUCIAL MARKER PLACEMENT;  Surgeon: Garner Nash, DO;  Location: [facility];  Service: Pulmonary;;   INGUINAL HERNIA REPAIR Right 1991   LUMBAR FUSION  2016; 2017; 07/26/2017   L4-5; L2-3; L5-S1   POSTERIOR LUMBAR FUSION 4 WITH HARDWARE REMOVAL N/A 09/02/2017   Procedure: Lumbar three-four redo laminectomy; Repositioning of Left Sacral two screw; Extension of lumbar fusion to Thoracic twelve;  Surgeon: Ditty, Kevan Ny, MD;  Location: [facility];  Service: Neurosurgery;  Laterality: N/A;   TONSILLECTOMY AND ADENOIDECTOMY  1959   TUMOR EXCISION     WERTHIN'S TUMORS BOTH SIDES OF NECK   VAGINAL HYSTERECTOMY  1979   VIDEO BRONCHOSCOPY WITH RADIAL ENDOBRONCHIAL ULTRASOUND  09/15/2021   Procedure: VIDEO BRONCHOSCOPY WITH RADIAL ENDOBRONCHIAL ULTRASOUND;  Surgeon: Garner Nash, DO;  Location: [facility];  Service: Pulmonary;;   VIDEO BRONCHOSCOPY WITH RADIAL ENDOBRONCHIAL ULTRASOUND  05/04/2022   Procedure: VIDEO BRONCHOSCOPY WITH RADIAL ENDOBRONCHIAL ULTRASOUND;  Surgeon: Garner Nash, DO;  Location: [facility];  Service: Pulmonary;;    Family History  Problem Relation Age of Onset   Diabetes Mother    Dementia Mother    Cirrhosis Mother        Non alcoholic   COPD Father    Diabetes Brother      Social History  Tobacco Use  Smoking Status Former   Packs/day: 1.50   Years: 35.00   Total pack years: 52.50   Types: Cigarettes   Quit date: 05/13/2006   Years since quitting: 16.0  Smokeless Tobacco Never    Social History   Substance and Sexual Activity  Alcohol Use Yes   Comment: occasional     Allergies  Allergen Reactions   Shellfish Allergy Anaphylaxis, Swelling and Other (See Comments)    Tongue swells   Oxycodone Nausea Only   Penicillins Rash and Other (See Comments)    Has patient had a PCN reaction causing immediate rash, facial/tongue/throat swelling, SOB or lightheadedness with hypotension: Yes Has patient had a PCN reaction causing severe rash involving mucus membranes or skin necrosis: No Has patient had a PCN reaction that required hospitalization No Has patient had a PCN reaction occurring within the last 10 years: No If all of the above answers are "NO", then may proceed with Cephalosporin use.    Current Outpatient Medications  Medication Sig Dispense Refill   atorvastatin (LIPITOR) 40 MG tablet Take 1 tablet (40 mg total) by mouth daily. For cholesterol. 90 tablet 3   baclofen (LIORESAL) 10 MG tablet TAKE 1 TABLET(10 MG) BY MOUTH THREE TIMES DAILY AS NEEDED FOR MUSCLE SPASMS (Patient taking differently: Take 10 mg by mouth 2 (two) times daily. TAKE 1 TABLET(10 MG) BY MOUTH THREE TIMES DAILY AS NEEDED FOR MUSCLE SPASMS) 180 tablet 3   Biotin 5000 MCG CAPS Take 5,000 mcg by mouth every morning.       celecoxib (CELEBREX) 200 MG capsule Take 1 capsule (200 mg total) by mouth daily. 90 capsule 0   cetirizine (ZYRTEC) 10 MG tablet Take 1 tablet (10 mg total) by mouth daily. For allergies 90 tablet 0   Cholecalciferol (VITAMIN D) 50 MCG (2000 UT) tablet Take 2,000 Units by mouth daily.     Coenzyme Q10 (COQ10 PO) Take 100 mg by mouth every evening.     esomeprazole (NEXIUM 24HR) 20 MG capsule Take 1 capsule (20 mg total) by mouth daily at 12 noon. (Patient taking differently: Take 20 mg by mouth daily.) 90 capsule 1   fluocinonide cream (LIDEX) 8.85 % Apply 1 Application topically 2 (two) times daily as needed (irritation).     gabapentin (NEURONTIN) 300 MG capsule Take 1 capsule (300 mg total) by mouth 2 (two) times daily. For pain. 180 capsule 3   ibuprofen (ADVIL) 200 MG tablet Take 400-600 mg by mouth every 8 (eight) hours as needed (pain).     lisinopril (ZESTRIL) 20 MG tablet TAKE 1 TABLET(20 MG) BY MOUTH DAILY FOR BLOOD PRESSURE 90 tablet 1   metFORMIN (GLUCOPHAGE) 500 MG tablet TAKE 1 TABLET BY MOUTH TWICE DAILY FOR DIABETES 180 tablet 1   nystatin-triamcinolone ointment (MYCOLOG) Apply 1 Application topically 2 (two) times daily as needed (irritation).     oxybutynin (DITROPAN-XL) 5 MG 24 hr tablet TAKE 1 TABLET(5 MG) BY MOUTH AT BEDTIME FOR OVERACTIVE BLADDER 90 tablet 2   oxybutynin (OXYTROL) 3.9 MG/24HR Place 1 patch onto the skin See admin instructions. Change every 5 days     Polyethyl Glycol-Propyl Glycol (LUBRICANT EYE DROPS) 0.4-0.3 % SOLN Place 1-2 drops into both eyes 3 (three) times daily as needed (dry/irritated eyes.).     senna-docusate (SENOKOT-S) 8.6-50 MG tablet Take 2 tablets by mouth 2 (two) times daily. (Patient taking differently: Take 1 tablet by mouth every evening.)     terbinafine (LAMISIL) 250 MG tablet Take  1 tablet (250 mg total) by mouth daily. For toenail fungus 24 tablet 0   No current facility-administered medications for this visit.    Review of Systems   Constitutional:  Positive for malaise/fatigue. Negative for fever and weight loss.  Cardiovascular:  Negative for chest pain.  Musculoskeletal:  Positive for back pain and joint pain.  Neurological:  Positive for sensory change. Negative for dizziness and headaches.     PHYSICAL EXAMINATION: BP (!) 168/86 (BP Location: Left Arm, Patient Position: Sitting)   Pulse 97   Resp 18   Ht 5\' 7"  (1.702 m)   Wt 174 lb (78.9 kg)   SpO2 94% Comment: RA  BMI 27.25 kg/m  Physical Exam Constitutional:      General: She is not in acute distress.    Appearance: She is not ill-appearing.  Cardiovascular:     Rate and Rhythm: Normal rate.  Pulmonary:     Effort: Pulmonary effort is normal. No respiratory distress.  Abdominal:     General: Abdomen is flat. There is no distension.  Musculoskeletal:     Cervical back: Normal range of motion.  Skin:    General: Skin is warm and dry.  Neurological:     Mental Status: She is alert. Mental status is at baseline.     Diagnostic Studies & Laboratory data:     Recent Radiology Findings:   DG C-ARM BRONCHOSCOPY  Result Date: 05/04/2022 C-ARM BRONCHOSCOPY: Fluoroscopy was utilized by the requesting physician.  No radiographic interpretation.   DG CHEST PORT 1 VIEW  Result Date: 05/04/2022 CLINICAL DATA:  Postop bronchoscopy EXAM: PORTABLE CHEST 1 VIEW COMPARISON:  09/15/2021 FINDINGS: Left lower lobe density unchanged. Possible mass lesion based on CT and PET. Surgical clips left base unchanged. No pneumothorax Right lung clear.  No heart failure or effusion. IMPRESSION: Left lower lobe density unchanged. Possible mass lesion. No pneumothorax. Electronically Signed   By: Franchot Gallo M.D.   On: 05/04/2022 09:33       I have independently reviewed the above radiology studies  and reviewed the findings with the patient.   Recent Lab Findings: Lab Results  Component Value Date   WBC 6.2 05/04/2022   HGB 13.4 05/04/2022   HCT 39.6 05/04/2022    PLT 190 05/04/2022   GLUCOSE 162 (H) 05/04/2022   CHOL 134 10/06/2021   TRIG 196.0 (H) 10/06/2021   HDL 37.80 (L) 10/06/2021   LDLDIRECT 64.0 01/22/2020   LDLCALC 57 10/06/2021   ALT 24 07/31/2021   AST 18 07/31/2021   NA 138 05/04/2022   K 4.4 05/04/2022   CL 107 05/04/2022   CREATININE 0.97 05/04/2022   BUN 14 05/04/2022   CO2 25 05/04/2022   HGBA1C 6.9 (A) 01/29/2022     PFTs:  - FVC: 81% - FEV1: 76% -DLCO: 74%       FINAL MICROSCOPIC DIAGNOSIS:  A.  LEFT LUNG, LOWER LOBE, NODULE, NEEDLE ASPIRATION AND BIOPSIES:  - Malignant  - Adenocarcinoma    Assessment / Plan:   71yo female with biopsy proven LLL adenocarcinoma.  Nodule measures 3.9cm.  I have ordered an MRI brain given the size of the tumor.  She would like to proceed with surgical resection.  We discussed the risks and benefits of a left robotic assisted thoracoscopy with left lower lobectomy.  A stress test has also been ordered.  Of note she has had spinal surgery in the past along with rib fractures on the left.  She also has  a history of nausea with anesthesia.  I  spent 40 minutes with  the patient face to face in counseling and coordination of care.    [name] 05/21/2022 2:57 PM

## 2022-05-18 NOTE — Progress Notes (Signed)
[address]       [place] [address]             [phone]                    Launa W Mccomber [place] Medical Record #[id number] Date of Birth: 1950-11-27  Referring: Magdalen Spatz, NP Primary Care: Pleas Koch, NP Primary Cardiologist: None  Chief Complaint:    Chief Complaint  Patient presents with   Lung Lesion    Bronch 8/22, PET 7/28, CT 7/10, PFTs 9/1    History of Present Illness:    Megan [name] 71 y.o. female presents for surgical evaluation of a 3.9 cm left lower lobe biopsy-proven non-small cell lung cancer.  She has a history of significant smoking and was originally screened in our lung cancer program.  She subsequently underwent a biopsy with Dr. Valeta Harms.  She denies any neurologic symptoms outside of those related to her previous spinal surgeries.  Her weight has been stable.  She is able to ambulate short distances with a walker.  Smoking Hx: Quit in 2007   Zubrod Score: At the time of surgery this patient's most appropriate activity status/level should be described as: []     0    Normal activity, no symptoms []     1    Restricted in physical strenuous activity but ambulatory, able to do out light work [x]     2    Ambulatory and capable of self care, unable to do work activities, up and about               >50 % of waking hours                              []     3    Only limited self care, in bed greater than 50% of waking hours []     4    Completely disabled, no self care, confined to bed or chair []     5    Moribund   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   Constipation due to pain medication    Debility    Dyspnea    W/ PHYS CONDITION    Emphysema of lung ([facility])    Foul smelling urine 07/22/2020   GERD (gastroesophageal reflux  disease)    History of bronchitis    "not since I quit smoking" (07/26/2017)   History of kidney stones    History of shingles    Hyperlipidemia    takes Fish Oil daily   Hypertension    Hypoalbuminemia due to protein-calorie malnutrition ([facility])    L3 vertebral fracture ([facility]) 09/01/2017   Myelopathy ([facility]) 12/21/2016   Neuromuscular disorder ([facility])    tingling toes   Paraparesis ([facility]) 12/15/2016   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   Pneumonia 2009   PONV (postoperative nausea and vomiting)    Radiculopathy 07/28/2017   Restless leg    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 Deitra Mayo for approach;  Surgeon: Ditty, Kevan Ny, MD;  Location: [facility];  Service: Neurosurgery;  Laterality: N/A;   APPLICATION OF ROBOTIC ASSISTANCE FOR SPINAL PROCEDURE  12/19/2016   Procedure: APPLICATION OF ROBOTIC ASSISTANCE FOR SPINAL PROCEDURE;  Surgeon: Kevan Ny Ditty, MD;  Location: [facility];  Service: Neurosurgery;;   APPLICATION OF ROBOTIC ASSISTANCE FOR SPINAL PROCEDURE N/A 07/26/2017   Procedure: APPLICATION OF ROBOTIC ASSISTANCE FOR SPINAL PROCEDURE;  Surgeon: Ditty, Kevan Ny, MD;  Location: [facility];  Service: Neurosurgery;  Laterality: N/A;   APPLICATION OF ROBOTIC ASSISTANCE FOR SPINAL PROCEDURE N/A 09/02/2017   Procedure: APPLICATION OF ROBOTIC ASSISTANCE FOR SPINAL  PROCEDURE;  Surgeon: Ditty, Kevan Ny, MD;  Location: [facility];  Service: Neurosurgery;  Laterality: N/A;   BACK SURGERY     BLADDER SUSPENSION  1991   tack   BRONCHIAL BIOPSY  09/15/2021   Procedure: BRONCHIAL BIOPSIES;  Surgeon: Garner Nash, DO;  Location: [facility];  Service: Pulmonary;;   BRONCHIAL BIOPSY  05/04/2022   Procedure: BRONCHIAL BIOPSIES;  Surgeon: Garner Nash, DO;  Location: [facility];  Service: Pulmonary;;   BRONCHIAL BRUSHINGS  09/15/2021   Procedure: BRONCHIAL BRUSHINGS;  Surgeon: Garner Nash, DO;  Location: [facility];  Service: Pulmonary;;   BRONCHIAL NEEDLE ASPIRATION BIOPSY  09/15/2021   Procedure: BRONCHIAL NEEDLE ASPIRATION BIOPSIES;  Surgeon: Garner Nash, DO;  Location: [facility];  Service: Pulmonary;;   BRONCHIAL NEEDLE ASPIRATION BIOPSY  05/04/2022   Procedure: BRONCHIAL NEEDLE ASPIRATION BIOPSIES;  Surgeon: Garner Nash, DO;  Location: [facility];  Service: Pulmonary;;   BRONCHIAL WASHINGS  09/15/2021   Procedure: BRONCHIAL WASHINGS;  Surgeon: Garner Nash, DO;  Location: [facility];  Service: Pulmonary;;   FIDUCIAL MARKER PLACEMENT  09/15/2021   Procedure: FIDUCIAL MARKER PLACEMENT;  Surgeon: Garner Nash, DO;  Location: [facility];  Service: Pulmonary;;   INGUINAL HERNIA REPAIR Right 1991   LUMBAR FUSION  2016; 2017; 07/26/2017   L4-5; L2-3; L5-S1   POSTERIOR LUMBAR FUSION 4 WITH HARDWARE REMOVAL N/A 09/02/2017   Procedure: Lumbar three-four redo laminectomy; Repositioning of Left Sacral two screw; Extension of lumbar fusion to Thoracic twelve;  Surgeon: Ditty, Kevan Ny, MD;  Location: [facility];  Service: Neurosurgery;  Laterality: N/A;   TONSILLECTOMY AND ADENOIDECTOMY  1959   TUMOR EXCISION     WERTHIN'S TUMORS BOTH SIDES OF NECK   VAGINAL HYSTERECTOMY  1979   VIDEO BRONCHOSCOPY WITH RADIAL ENDOBRONCHIAL ULTRASOUND  09/15/2021   Procedure: VIDEO BRONCHOSCOPY WITH RADIAL ENDOBRONCHIAL ULTRASOUND;  Surgeon: Garner Nash, DO;  Location: [facility];  Service: Pulmonary;;   VIDEO BRONCHOSCOPY WITH RADIAL ENDOBRONCHIAL ULTRASOUND  05/04/2022   Procedure: VIDEO BRONCHOSCOPY WITH RADIAL ENDOBRONCHIAL ULTRASOUND;  Surgeon: Garner Nash, DO;  Location: [facility];  Service: Pulmonary;;    Family History  Problem Relation Age of Onset   Diabetes Mother    Dementia Mother    Cirrhosis Mother        Non alcoholic   COPD Father    Diabetes Brother      Social History  Tobacco Use  Smoking Status Former   Packs/day: 1.50   Years: 35.00   Total pack years: 52.50   Types: Cigarettes   Quit date: 05/13/2006   Years since quitting: 16.0  Smokeless Tobacco Never    Social History   Substance and Sexual Activity  Alcohol Use Yes   Comment: occasional     Allergies  Allergen Reactions   Shellfish Allergy Anaphylaxis, Swelling and Other (See Comments)    Tongue swells   Oxycodone Nausea Only   Penicillins Rash and Other (See Comments)    Has patient had a PCN reaction causing immediate rash, facial/tongue/throat swelling, SOB or lightheadedness with hypotension: Yes Has patient had a PCN reaction causing severe rash involving mucus membranes or skin necrosis: No Has patient had a PCN reaction that required hospitalization No Has patient had a PCN reaction occurring within the last 10 years: No If all of the above answers are "NO", then may proceed with Cephalosporin use.    Current Outpatient Medications  Medication Sig Dispense Refill   atorvastatin (LIPITOR) 40 MG tablet Take 1 tablet (40 mg total) by mouth daily. For cholesterol. 90 tablet 3   baclofen (LIORESAL) 10 MG tablet TAKE 1 TABLET(10 MG) BY MOUTH THREE TIMES DAILY AS NEEDED FOR MUSCLE SPASMS (Patient taking differently: Take 10 mg by mouth 2 (two) times daily. TAKE 1 TABLET(10 MG) BY MOUTH THREE TIMES DAILY AS NEEDED FOR MUSCLE SPASMS) 180 tablet 3   Biotin 5000 MCG CAPS Take 5,000 mcg by mouth every morning.       celecoxib (CELEBREX) 200 MG capsule Take 1 capsule (200 mg total) by mouth daily. 90 capsule 0   cetirizine (ZYRTEC) 10 MG tablet Take 1 tablet (10 mg total) by mouth daily. For allergies 90 tablet 0   Cholecalciferol (VITAMIN D) 50 MCG (2000 UT) tablet Take 2,000 Units by mouth daily.     Coenzyme Q10 (COQ10 PO) Take 100 mg by mouth every evening.     esomeprazole (NEXIUM 24HR) 20 MG capsule Take 1 capsule (20 mg total) by mouth daily at 12 noon. (Patient taking differently: Take 20 mg by mouth daily.) 90 capsule 1   fluocinonide cream (LIDEX) 6.23 % Apply 1 Application topically 2 (two) times daily as needed (irritation).     gabapentin (NEURONTIN) 300 MG capsule Take 1 capsule (300 mg total) by mouth 2 (two) times daily. For pain. 180 capsule 3   ibuprofen (ADVIL) 200 MG tablet Take 400-600 mg by mouth every 8 (eight) hours as needed (pain).     lisinopril (ZESTRIL) 20 MG tablet TAKE 1 TABLET(20 MG) BY MOUTH DAILY FOR BLOOD PRESSURE 90 tablet 1   metFORMIN (GLUCOPHAGE) 500 MG tablet TAKE 1 TABLET BY MOUTH TWICE DAILY FOR DIABETES 180 tablet 1   nystatin-triamcinolone ointment (MYCOLOG) Apply 1 Application topically 2 (two) times daily as needed (irritation).     oxybutynin (DITROPAN-XL) 5 MG 24 hr tablet TAKE 1 TABLET(5 MG) BY MOUTH AT BEDTIME FOR OVERACTIVE BLADDER 90 tablet 2   oxybutynin (OXYTROL) 3.9 MG/24HR Place 1 patch onto the skin See admin instructions. Change every 5 days     Polyethyl Glycol-Propyl Glycol (LUBRICANT EYE DROPS) 0.4-0.3 % SOLN Place 1-2 drops into both eyes 3 (three) times daily as needed (dry/irritated eyes.).     senna-docusate (SENOKOT-S) 8.6-50 MG tablet Take 2 tablets by mouth 2 (two) times daily. (Patient taking differently: Take 1 tablet by mouth every evening.)     terbinafine (LAMISIL) 250 MG tablet Take  1 tablet (250 mg total) by mouth daily. For toenail fungus 24 tablet 0   No current facility-administered medications for this visit.    Review of Systems   Constitutional:  Positive for malaise/fatigue. Negative for fever and weight loss.  Cardiovascular:  Negative for chest pain.  Musculoskeletal:  Positive for back pain and joint pain.  Neurological:  Positive for sensory change. Negative for dizziness and headaches.     PHYSICAL EXAMINATION: BP (!) 168/86 (BP Location: Left Arm, Patient Position: Sitting)   Pulse 97   Resp 18   Ht 5\' 7"  (1.702 m)   Wt 174 lb (78.9 kg)   SpO2 94% Comment: RA  BMI 27.25 kg/m  Physical Exam Constitutional:      General: She is not in acute distress.    Appearance: She is not ill-appearing.  Cardiovascular:     Rate and Rhythm: Normal rate.  Pulmonary:     Effort: Pulmonary effort is normal. No respiratory distress.  Abdominal:     General: Abdomen is flat. There is no distension.  Musculoskeletal:     Cervical back: Normal range of motion.  Skin:    General: Skin is warm and dry.  Neurological:     Mental Status: She is alert. Mental status is at baseline.     Diagnostic Studies & Laboratory data:     Recent Radiology Findings:   DG C-ARM BRONCHOSCOPY  Result Date: 05/04/2022 C-ARM BRONCHOSCOPY: Fluoroscopy was utilized by the requesting physician.  No radiographic interpretation.   DG CHEST PORT 1 VIEW  Result Date: 05/04/2022 CLINICAL DATA:  Postop bronchoscopy EXAM: PORTABLE CHEST 1 VIEW COMPARISON:  09/15/2021 FINDINGS: Left lower lobe density unchanged. Possible mass lesion based on CT and PET. Surgical clips left base unchanged. No pneumothorax Right lung clear.  No heart failure or effusion. IMPRESSION: Left lower lobe density unchanged. Possible mass lesion. No pneumothorax. Electronically Signed   By: Franchot Gallo M.D.   On: 05/04/2022 09:33       I have independently reviewed the above radiology studies  and reviewed the findings with the patient.   Recent Lab Findings: Lab Results  Component Value Date   WBC 6.2 05/04/2022   HGB 13.4 05/04/2022   HCT 39.6 05/04/2022    PLT 190 05/04/2022   GLUCOSE 162 (H) 05/04/2022   CHOL 134 10/06/2021   TRIG 196.0 (H) 10/06/2021   HDL 37.80 (L) 10/06/2021   LDLDIRECT 64.0 01/22/2020   LDLCALC 57 10/06/2021   ALT 24 07/31/2021   AST 18 07/31/2021   NA 138 05/04/2022   K 4.4 05/04/2022   CL 107 05/04/2022   CREATININE 0.97 05/04/2022   BUN 14 05/04/2022   CO2 25 05/04/2022   HGBA1C 6.9 (A) 01/29/2022     PFTs:  - FVC: 81% - FEV1: 76% -DLCO: 74%       FINAL MICROSCOPIC DIAGNOSIS:  A.  LEFT LUNG, LOWER LOBE, NODULE, NEEDLE ASPIRATION AND BIOPSIES:  - Malignant  - Adenocarcinoma    Assessment / Plan:   71yo female with biopsy proven LLL adenocarcinoma.  Nodule measures 3.9cm.  I have ordered an MRI brain given the size of the tumor.  She would like to proceed with surgical resection.  We discussed the risks and benefits of a left robotic assisted thoracoscopy with left lower lobectomy.  A stress test has also been ordered.  Of note she has had spinal surgery in the past along with rib fractures on the left.  She also has  a history of nausea with anesthesia.  I  spent 40 minutes with  the patient face to face in counseling and coordination of care.    [name] 05/21/2022 2:57 PM

## 2022-05-21 ENCOUNTER — Encounter: Payer: Self-pay | Admitting: *Deleted

## 2022-05-21 ENCOUNTER — Other Ambulatory Visit: Payer: Self-pay | Admitting: Thoracic Surgery (Cardiothoracic Vascular Surgery)

## 2022-05-21 ENCOUNTER — Other Ambulatory Visit: Payer: Self-pay | Admitting: *Deleted

## 2022-05-21 ENCOUNTER — Institutional Professional Consult (permissible substitution): Payer: BLUE CROSS/BLUE SHIELD | Admitting: Thoracic Surgery (Cardiothoracic Vascular Surgery)

## 2022-05-21 VITALS — BP 168/86 | HR 97 | Resp 18 | Ht 67.0 in | Wt 174.0 lb

## 2022-05-21 DIAGNOSIS — R911 Solitary pulmonary nodule: Secondary | ICD-10-CM

## 2022-05-22 ENCOUNTER — Ambulatory Visit
Admission: RE | Admit: 2022-05-22 | Discharge: 2022-05-22 | Disposition: A | Discharge status: Home / Self Care | Payer: PPO | Source: Ambulatory Visit | Attending: Thoracic Surgery (Cardiothoracic Vascular Surgery) | Admitting: Thoracic Surgery (Cardiothoracic Vascular Surgery)

## 2022-05-22 DIAGNOSIS — R911 Solitary pulmonary nodule: Secondary | ICD-10-CM

## 2022-05-22 DIAGNOSIS — R200 Anesthesia of skin: Secondary | ICD-10-CM | POA: Diagnosis not present

## 2022-05-22 DIAGNOSIS — R531 Weakness: Secondary | ICD-10-CM | POA: Diagnosis not present

## 2022-05-22 DIAGNOSIS — R262 Difficulty in walking, not elsewhere classified: Secondary | ICD-10-CM | POA: Diagnosis not present

## 2022-05-22 MED ORDER — GADOBENATE DIMEGLUMINE 529 MG/ML IV SOLN
15.0000 mL | Freq: Once | INTRAVENOUS | Status: AC | PRN
Start: 2022-05-22 — End: 2022-05-22
  Administered 2022-05-22: 15 mL via INTRAVENOUS

## 2022-05-25 ENCOUNTER — Ambulatory Visit (HOSPITAL_COMMUNITY): Payer: PPO | Attending: Thoracic Surgery (Cardiothoracic Vascular Surgery)

## 2022-05-25 DIAGNOSIS — R0609 Other forms of dyspnea: Secondary | ICD-10-CM | POA: Insufficient documentation

## 2022-05-25 DIAGNOSIS — E119 Type 2 diabetes mellitus without complications: Secondary | ICD-10-CM | POA: Diagnosis not present

## 2022-05-25 DIAGNOSIS — Z79899 Other long term (current) drug therapy: Secondary | ICD-10-CM | POA: Insufficient documentation

## 2022-05-25 DIAGNOSIS — R911 Solitary pulmonary nodule: Secondary | ICD-10-CM

## 2022-05-25 DIAGNOSIS — I10 Essential (primary) hypertension: Secondary | ICD-10-CM | POA: Insufficient documentation

## 2022-05-25 DIAGNOSIS — E785 Hyperlipidemia, unspecified: Secondary | ICD-10-CM | POA: Diagnosis not present

## 2022-05-25 DIAGNOSIS — Z01810 Encounter for preprocedural cardiovascular examination: Secondary | ICD-10-CM

## 2022-05-25 LAB — MYOCARDIAL PERFUSION IMAGING
LV dias vol: 46 mL (ref 46–106)
LV sys vol: 15 mL
Nuc Stress EF: 68 %
Peak HR: 115 {beats}/min
Rest HR: 82 {beats}/min
Rest Nuclear Isotope Dose: 10.3 mCi
SDS: 0
SRS: 0
SSS: 0
ST Depression (mm): 0 mm
Stress Nuclear Isotope Dose: 31 mCi
TID: 1

## 2022-05-25 LAB — CULTURE, FUNGUS WITHOUT SMEAR

## 2022-05-25 MED ORDER — REGADENOSON 0.4 MG/5ML IV SOLN
0.4000 mg | Freq: Once | INTRAVENOUS | Status: AC
  Administered 2022-05-25: 0.4 mg via INTRAVENOUS

## 2022-05-25 MED ORDER — TECHNETIUM TC 99M TETROFOSMIN IV KIT
10.3000 | PACK | Freq: Once | INTRAVENOUS | Status: AC | PRN
  Administered 2022-05-25: 10.3 via INTRAVENOUS

## 2022-05-25 MED ORDER — TECHNETIUM TC 99M TETROFOSMIN IV KIT
31.0000 | PACK | Freq: Once | INTRAVENOUS | Status: AC | PRN
  Administered 2022-05-25: 31 via INTRAVENOUS

## 2022-06-01 NOTE — Pre-Procedure Instructions (Signed)
Surgical Instructions    Your procedure is scheduled on Thursday, September 21st.  Report to [facility] Main Entrance "A" at 09:30 A.M., then check in with the Admitting office.  Call this number if you have problems the morning of surgery:  [phone]   If you have any questions prior to your surgery date call [phone]: Open Monday-Friday 8am-4pm    Remember:  Do not eat or drink after midnight the night before your surgery    Take these medicines the morning of surgery with A SIP OF WATER  atorvastatin (LIPITOR)  baclofen (LIORESAL)  cetirizine (ZYRTEC)  gabapentin (NEURONTIN)  Polyethyl Glycol-Propyl Glycol (LUBRICANT EYE DROPS)    As of today, STOP taking any Aspirin (unless otherwise instructed by your surgeon) Aleve, Naproxen, Ibuprofen, Motrin, Advil, Goody's, BC's, all herbal medications, fish oil, and all vitamins. This includes celecoxib (CELEBREX)   WHAT DO I DO ABOUT MY DIABETES MEDICATION?   Do not take metFORMIN (GLUCOPHAGE) the morning of surgery.    HOW TO MANAGE YOUR DIABETES BEFORE AND AFTER SURGERY  Why is it important to control my blood sugar before and after surgery? Improving blood sugar levels before and after surgery helps healing and can limit problems. A way of improving blood sugar control is eating a healthy diet by:  Eating less sugar and carbohydrates  Increasing activity/exercise  Talking with your doctor about reaching your blood sugar goals High blood sugars (greater than 180 mg/dL) can raise your risk of infections and slow your recovery, so you will need to focus on controlling your diabetes during the weeks before surgery. Make sure that the doctor who takes care of your diabetes knows about your planned surgery including the date and location.  How do I manage my blood sugar before surgery? Check your blood sugar at least 4 times a day, starting 2 days before surgery, to make sure that the level is not too high or low.  Check  your blood sugar the morning of your surgery when you wake up and every 2 hours until you get to the Short Stay unit.  If your blood sugar is less than 70 mg/dL, you will need to treat for low blood sugar: Do not take insulin. Treat a low blood sugar (less than 70 mg/dL) with  cup of clear juice (cranberry or apple), 4 glucose tablets, OR glucose gel. Recheck blood sugar in 15 minutes after treatment (to make sure it is greater than 70 mg/dL). If your blood sugar is not greater than 70 mg/dL on recheck, call [phone] for further instructions. Report your blood sugar to the short stay nurse when you get to Short Stay.  If you are admitted to the hospital after surgery: Your blood sugar will be checked by the staff and you will probably be given insulin after surgery (instead of oral diabetes medicines) to make sure you have good blood sugar levels. The goal for blood sugar control after surgery is 80-180 mg/dL.                     Do NOT Smoke (Tobacco/Vaping) for 24 hours prior to your procedure.  If you use a CPAP at night, you may bring your mask/headgear for your overnight stay.   Contacts, glasses, piercing's, hearing aid's, dentures or partials may not be worn into surgery, please bring cases for these belongings.    For patients admitted to the hospital, discharge time will be determined by your treatment team.   Patients discharged  the day of surgery will not be allowed to drive home, and someone needs to stay with them for 24 hours.  SURGICAL WAITING ROOM VISITATION Patients having surgery or a procedure may have no more than 2 support people in the waiting area - these visitors may rotate.   Children under the age of 33 must have an adult with them who is not the patient. If the patient needs to stay at the hospital during part of their recovery, the visitor guidelines for inpatient rooms apply. Pre-op nurse will coordinate an appropriate time for 1 support person to accompany  patient in pre-op.  This support person may not rotate.   Please refer to the [facility] website for the visitor guidelines for Inpatients (after your surgery is over and you are in a regular room).    Special instructions:   [facility]- Preparing For Surgery  Before surgery, you can play an important role. Because skin is not sterile, your skin needs to be as free of germs as possible. You can reduce the number of germs on your skin by washing with CHG (chlorahexidine gluconate) Soap before surgery.  CHG is an antiseptic cleaner which kills germs and bonds with the skin to continue killing germs even after washing.    Oral Hygiene is also important to reduce your risk of infection.  Remember - BRUSH YOUR TEETH THE MORNING OF SURGERY WITH YOUR REGULAR TOOTHPASTE  Please do not use if you have an allergy to CHG or antibacterial soaps. If your skin becomes reddened/irritated stop using the CHG.  Do not shave (including legs and underarms) for at least 48 hours prior to first CHG shower. It is OK to shave your face.  Please follow these instructions carefully.   Shower the NIGHT BEFORE SURGERY and the MORNING OF SURGERY  If you chose to wash your hair, wash your hair first as usual with your normal shampoo.  After you shampoo, rinse your hair and body thoroughly to remove the shampoo.  Use CHG Soap as you would any other liquid soap. You can apply CHG directly to the skin and wash gently with a scrungie or a clean washcloth.   Apply the CHG Soap to your body ONLY FROM THE NECK DOWN.  Do not use on open wounds or open sores. Avoid contact with your eyes, ears, mouth and genitals (private parts). Wash Face and genitals (private parts)  with your normal soap.   Wash thoroughly, paying special attention to the area where your surgery will be performed.  Thoroughly rinse your body with warm water from the neck down.  DO NOT shower/wash with your normal soap after using and rinsing off the CHG  Soap.  Pat yourself dry with a CLEAN TOWEL.  Wear CLEAN PAJAMAS to bed the night before surgery  Place CLEAN SHEETS on your bed the night before your surgery  DO NOT SLEEP WITH PETS.   Day of Surgery: Take a shower with CHG soap. Do not wear jewelry or makeup Do not wear lotions, powders, perfumes, or deodorant. Do not shave 48 hours prior to surgery.   Do not bring valuables to the hospital. [facility] is not responsible for any belongings or valuables. Do not wear nail polish, gel polish, artificial nails, or any other type of covering on natural nails (fingers and toes) If you have artificial nails or gel coating that need to be removed by a nail salon, please have this removed prior to surgery. Artificial nails or gel  coating may interfere with anesthesia's ability to adequately monitor your vital signs. Wear Clean/Comfortable clothing the morning of surgery Remember to brush your teeth WITH YOUR REGULAR TOOTHPASTE.   Please read over the following fact sheets that you were given.    If you received a COVID test during your pre-op visit  it is requested that you wear a mask when out in public, stay away from anyone that may not be feeling well and notify your surgeon if you develop symptoms. If you have been in contact with anyone that has tested positive in the last 10 days please notify you surgeon.

## 2022-06-02 ENCOUNTER — Other Ambulatory Visit: Payer: Self-pay | Admitting: *Deleted

## 2022-06-02 ENCOUNTER — Encounter: Payer: Self-pay | Admitting: *Deleted

## 2022-06-02 ENCOUNTER — Encounter (HOSPITAL_COMMUNITY): Payer: Self-pay

## 2022-06-02 ENCOUNTER — Other Ambulatory Visit: Payer: Self-pay

## 2022-06-02 ENCOUNTER — Encounter (HOSPITAL_COMMUNITY)
Admission: RE | Admit: 2022-06-02 | Discharge: 2022-06-02 | Disposition: A | Discharge status: Home / Self Care | Payer: PPO | Source: Ambulatory Visit | Attending: Thoracic Surgery (Cardiothoracic Vascular Surgery) | Admitting: Thoracic Surgery (Cardiothoracic Vascular Surgery)

## 2022-06-02 ENCOUNTER — Ambulatory Visit (HOSPITAL_COMMUNITY)
Admission: RE | Admit: 2022-06-02 | Discharge: 2022-06-02 | Disposition: A | Discharge status: Home / Self Care | Payer: PPO | Source: Ambulatory Visit | Attending: Thoracic Surgery (Cardiothoracic Vascular Surgery) | Admitting: Thoracic Surgery (Cardiothoracic Vascular Surgery)

## 2022-06-02 VITALS — BP 170/90 | HR 94 | Temp 98.3°F | Resp 17 | Ht 67.0 in | Wt 174.0 lb

## 2022-06-02 DIAGNOSIS — Z01818 Encounter for other preprocedural examination: Secondary | ICD-10-CM | POA: Insufficient documentation

## 2022-06-02 DIAGNOSIS — E1142 Type 2 diabetes mellitus with diabetic polyneuropathy: Secondary | ICD-10-CM | POA: Insufficient documentation

## 2022-06-02 DIAGNOSIS — R9431 Abnormal electrocardiogram [ECG] [EKG]: Secondary | ICD-10-CM | POA: Diagnosis not present

## 2022-06-02 DIAGNOSIS — R911 Solitary pulmonary nodule: Secondary | ICD-10-CM

## 2022-06-02 DIAGNOSIS — U071 COVID-19: Secondary | ICD-10-CM | POA: Diagnosis not present

## 2022-06-02 DIAGNOSIS — I10 Essential (primary) hypertension: Secondary | ICD-10-CM | POA: Diagnosis not present

## 2022-06-02 HISTORY — DX: Depression, unspecified: F32.A

## 2022-06-02 LAB — COMPREHENSIVE METABOLIC PANEL
ALT: 21 U/L (ref 0–44)
AST: 19 U/L (ref 15–41)
Albumin: 3.7 g/dL (ref 3.5–5.0)
Alkaline Phosphatase: 81 U/L (ref 38–126)
Anion gap: 8 (ref 5–15)
BUN: 12 mg/dL (ref 8–23)
CO2: 27 mmol/L (ref 22–32)
Calcium: 9 mg/dL (ref 8.9–10.3)
Chloride: 104 mmol/L (ref 98–111)
Creatinine, Ser: 0.98 mg/dL (ref 0.44–1.00)
GFR, Estimated: >60 mL/min (ref >60)
Glucose, Bld: 257 mg/dL — ABNORMAL HIGH (ref 70–99)
Potassium: 4.3 mmol/L (ref 3.5–5.1)
Sodium: 139 mmol/L (ref 135–145)
Total Bilirubin: 0.7 mg/dL (ref 0.3–1.2)
Total Protein: 7 g/dL (ref 6.5–8.1)

## 2022-06-02 LAB — URINALYSIS, ROUTINE W REFLEX MICROSCOPIC
Bilirubin Urine: NEGATIVE
Glucose, UA: 50 mg/dL — AB
Hgb urine dipstick: NEGATIVE
Ketones, ur: NEGATIVE mg/dL
Nitrite: NEGATIVE
Protein, ur: NEGATIVE mg/dL
Specific Gravity, Urine: 1.009 (ref 1.005–1.030)
pH: 5 (ref 5.0–8.0)

## 2022-06-02 LAB — CBC
HCT: 39.5 % (ref 36.0–46.0)
Hemoglobin: 13.2 g/dL (ref 12.0–15.0)
MCH: 29.6 pg (ref 26.0–34.0)
MCHC: 33.4 g/dL (ref 30.0–36.0)
MCV: 88.6 fL (ref 80.0–100.0)
Platelets: 199 10*3/uL (ref 150–400)
RBC: 4.46 MIL/uL (ref 3.87–5.11)
RDW: 13.4 % (ref 11.5–15.5)
WBC: 4.2 10*3/uL (ref 4.0–10.5)
nRBC: 0 % (ref 0.0–0.2)

## 2022-06-02 LAB — APTT: aPTT: 25 seconds (ref 24–36)

## 2022-06-02 LAB — HEMOGLOBIN A1C
Hgb A1c MFr Bld: 7.1 % — ABNORMAL HIGH (ref 4.8–5.6)
Mean Plasma Glucose: 157.07 mg/dL

## 2022-06-02 LAB — GLUCOSE, CAPILLARY: Glucose-Capillary: 242 mg/dL — ABNORMAL HIGH (ref 70–99)

## 2022-06-02 LAB — PROTIME-INR
INR: 1 (ref 0.8–1.2)
Prothrombin Time: 13.4 seconds (ref 11.4–15.2)

## 2022-06-02 LAB — SURGICAL PCR SCREEN
MRSA, PCR: NEGATIVE
Staphylococcus aureus: NEGATIVE

## 2022-06-02 LAB — SARS CORONAVIRUS 2 BY RT PCR: SARS Coronavirus 2 by RT PCR: POSITIVE — AB

## 2022-06-02 NOTE — Progress Notes (Signed)
PCP - Alma Friendly, NP Cardiologist - denies  PPM/ICD - denies   Chest x-ray - 06/02/22 EKG - 06/02/22 Stress Test - 05/25/22 ECHO - denies Cardiac Cath - denies  Sleep Study - denies  DM- Type 2 Fasting Blood Sugar - 120-140 Checks Blood Sugar once a day  ASA/Blood Thinner Instructions: n/a   ERAS Protcol - NPO   COVID TEST- 06/02/22   Anesthesia review: no  Patient denies shortness of breath, fever, cough and chest pain at PAT appointment   All instructions explained to the patient, with a verbal understanding of the material. Patient agrees to go over the instructions while at home for a better understanding. Patient also instructed to wear a mask in public after being tested for COVID-19. The opportunity to ask questions was provided.

## 2022-06-02 NOTE — Progress Notes (Signed)
COVID+ result reported to Levonne Spiller, Therapist, sports.

## 2022-06-03 ENCOUNTER — Other Ambulatory Visit: Payer: Self-pay | Admitting: Primary Care

## 2022-06-03 DIAGNOSIS — N3281 Overactive bladder: Secondary | ICD-10-CM

## 2022-06-04 ENCOUNTER — Telehealth: Payer: Self-pay | Admitting: Primary Care

## 2022-06-04 NOTE — Telephone Encounter (Signed)
Patient had U/A done as pre op with for another provider. Advised that she should reach out to them. They would be the ones that would review and give recommendation. She will let us know if any other questions.  No further action needed at this time.

## 2022-06-04 NOTE — Telephone Encounter (Signed)
Patient called in and wanted to know if Megan [name] had a chance to look over her urine analysis. She is concerned she may have an UTI. Thank you!

## 2022-06-10 ENCOUNTER — Other Ambulatory Visit: Payer: Self-pay

## 2022-06-10 ENCOUNTER — Other Ambulatory Visit: Payer: Self-pay | Admitting: Primary Care

## 2022-06-10 DIAGNOSIS — N3000 Acute cystitis without hematuria: Secondary | ICD-10-CM

## 2022-06-10 DIAGNOSIS — M62838 Other muscle spasm: Secondary | ICD-10-CM

## 2022-06-10 MED ORDER — CIPROFLOXACIN HCL 500 MG PO TABS: 500.0000 mg | ORAL_TABLET | Freq: Two times a day (BID) | ORAL | 0 refills | Status: DC

## 2022-06-11 ENCOUNTER — Telehealth: Payer: Self-pay | Admitting: Primary Care

## 2022-06-11 NOTE — Telephone Encounter (Signed)
Patient called and stated she just took her blood pressure was 168/90. Patient was sent to access nurse.

## 2022-06-11 NOTE — Progress Notes (Signed)
Surgical Instructions    Your procedure is scheduled on Wednesday, 06/16/22.  Report to [facility] Main Entrance "A" at 6:30 A.M., then check in with the Admitting office.  Call this number if you have problems the morning of surgery:  [phone]   If you have any questions prior to your surgery date call [phone]: Open Monday-Friday 8am-4pm If you experience any cold or flu symptoms such as cough, fever, chills, shortness of breath, etc. between now and your scheduled surgery, please notify us at the above number     Remember:  Do not eat or drink after midnight the night before your surgery     Take these medicines the morning of surgery with A SIP OF WATER:  atorvastatin (LIPITOR)  baclofen (LIORESAL) cetirizine (ZYRTEC) ciprofloxacin (CIPRO)  gabapentin (NEURONTIN)  IF NEEDED: Polyethyl Glycol-Propyl Glycol (LUBRICANT EYE DROPS)  As of today, STOP taking any Aspirin (unless otherwise instructed by your surgeon) Aleve, Naproxen, Ibuprofen, Motrin, Advil, Goody's, BC's, all herbal medications, celecoxib (CELEBREX), fish oil, and all vitamins.  WHAT DO I DO ABOUT MY DIABETES MEDICATION?   Do not take oral diabetes medicines (pills) the morning of surgery.  THE MORNING OF SURGERY, do not take metFORMIN (GLUCOPHAGE).  The day of surgery, do not take other diabetes injectables, including Byetta (exenatide), Bydureon (exenatide ER), Victoza (liraglutide), or Trulicity (dulaglutide).  If your CBG is greater than 220 mg/dL, you may take  of your sliding scale (correction) dose of insulin.   HOW TO MANAGE YOUR DIABETES BEFORE AND AFTER SURGERY  Why is it important to control my blood sugar before and after surgery? Improving blood sugar levels before and after surgery helps healing and can limit problems. A way of improving blood sugar control is eating a healthy diet by:  Eating less sugar and carbohydrates  Increasing activity/exercise  Talking with your doctor about  reaching your blood sugar goals High blood sugars (greater than 180 mg/dL) can raise your risk of infections and slow your recovery, so you will need to focus on controlling your diabetes during the weeks before surgery. Make sure that the doctor who takes care of your diabetes knows about your planned surgery including the date and location.  How do I manage my blood sugar before surgery? Check your blood sugar at least 4 times a day, starting 2 days before surgery, to make sure that the level is not too high or low.  Check your blood sugar the morning of your surgery when you wake up and every 2 hours until you get to the Short Stay unit.  If your blood sugar is less than 70 mg/dL, you will need to treat for low blood sugar: Do not take insulin. Treat a low blood sugar (less than 70 mg/dL) with  cup of clear juice (cranberry or apple), 4 glucose tablets, OR glucose gel. Recheck blood sugar in 15 minutes after treatment (to make sure it is greater than 70 mg/dL). If your blood sugar is not greater than 70 mg/dL on recheck, call [phone] for further instructions. Report your blood sugar to the short stay nurse when you get to Short Stay.  If you are admitted to the hospital after surgery: Your blood sugar will be checked by the staff and you will probably be given insulin after surgery (instead of oral diabetes medicines) to make sure you have good blood sugar levels. The goal for blood sugar control after surgery is 80-180 mg/dL.      Do not wear jewelry  or makeup. Do not wear lotions, powders, perfumes/cologne or deodorant. Do not shave 48 hours prior to surgery.   Do not bring valuables to the hospital. Do not wear nail polish, gel polish, artificial nails, or any other type of covering on natural nails (fingers and toes) If you have artificial nails or gel coating that need to be removed by a nail salon, please have this removed prior to surgery. Artificial nails or gel coating may  interfere with anesthesia's ability to adequately monitor your vital signs.  [place] is not responsible for any belongings or valuables.    Do NOT Smoke (Tobacco/Vaping)  24 hours prior to your procedure  If you use a CPAP at night, you may bring your mask for your overnight stay.   Contacts, glasses, hearing aids, dentures or partials may not be worn into surgery, please bring cases for these belongings   For patients admitted to the hospital, discharge time will be determined by your treatment team.   Patients discharged the day of surgery will not be allowed to drive home, and someone needs to stay with them for 24 hours.   SURGICAL WAITING ROOM VISITATION Patients having surgery or a procedure may have no more than 2 support people in the waiting area - these visitors may rotate.   Children under the age of 63 must have an adult with them who is not the patient. If the patient needs to stay at the hospital during part of their recovery, the visitor guidelines for inpatient rooms apply. Pre-op nurse will coordinate an appropriate time for 1 support person to accompany patient in pre-op.  This support person may not rotate.   Please refer to the [facility] website for the visitor guidelines for Inpatients (after your surgery is over and you are in a regular room).    Special instructions:    Oral Hygiene is also important to reduce your risk of infection.  Remember - BRUSH YOUR TEETH THE MORNING OF SURGERY WITH YOUR REGULAR TOOTHPASTE   [place]- Preparing For Surgery  Before surgery, you can play an important role. Because skin is not sterile, your skin needs to be as free of germs as possible. You can reduce the number of germs on your skin by washing with CHG (chlorahexidine gluconate) Soap before surgery.  CHG is an antiseptic cleaner which kills germs and bonds with the skin to continue killing germs even after washing.     Please do not use if you have an allergy to  CHG or antibacterial soaps. If your skin becomes reddened/irritated stop using the CHG.  Do not shave (including legs and underarms) for at least 48 hours prior to first CHG shower. It is OK to shave your face.  Please follow these instructions carefully.     Shower the NIGHT BEFORE SURGERY and the MORNING OF SURGERY with CHG Soap.   If you chose to wash your hair, wash your hair first as usual with your normal shampoo. After you shampoo, rinse your hair and body thoroughly to remove the shampoo.  Then [employer] and genitals (private parts) with your normal soap and rinse thoroughly to remove soap.  After that Use CHG Soap as you would any other liquid soap. You can apply CHG directly to the skin and wash gently with a scrungie or a clean washcloth.   Apply the CHG Soap to your body ONLY FROM THE NECK DOWN.  Do not use on open wounds or open sores. Avoid contact  with your eyes, ears, mouth and genitals (private parts). Wash Face and genitals (private parts)  with your normal soap.   Wash thoroughly, paying special attention to the area where your surgery will be performed.  Thoroughly rinse your body with warm water from the neck down.  DO NOT shower/wash with your normal soap after using and rinsing off the CHG Soap.  Pat yourself dry with a CLEAN TOWEL.  Wear CLEAN PAJAMAS to bed the night before surgery  Place CLEAN SHEETS on your bed the night before your surgery  DO NOT SLEEP WITH PETS.   Day of Surgery: Take a shower with CHG soap. Wear Clean/Comfortable clothing the morning of surgery Do not apply any deodorants/lotions.   Remember to brush your teeth WITH YOUR REGULAR TOOTHPASTE.    If you received a COVID test during your pre-op visit, it is requested that you wear a mask when out in public, stay away from anyone that may not be feeling well, and notify your surgeon if you develop symptoms. If you have been in contact with anyone that has tested positive in the last 10  days, please notify your surgeon.    Please read over the following fact sheets that you were given.

## 2022-06-11 NOTE — Telephone Encounter (Signed)
Per appt notes pt is already scheduled to see Dr Einar Pheasant on 06/14/22 at 12 noon. Pt care advice given to pt per access note. Sending note to Dr Einar Pheasant and [facility].

## 2022-06-11 NOTE — Telephone Encounter (Signed)
[facility] - Client TELEPHONE ADVICE RECORD AccessNurse Patient Name: Megan [name] Gender: Female DOB: 04-01-1951 Age: 71 Y 91 M 15 D Return Phone Number: [phone] (Primary), [phone] (Secondary) Address: [name]  [address] Client [name] - Client Client Site [place] - Day Provider Alma Friendly - NP Contact Type Call Who Is Calling Patient / Member / Family / Caregiver Call Type Triage / Clinical Relationship To Patient Self Return Phone Number [phone] (Primary) Chief Complaint Blood Pressure High Reason for Call Symptomatic / Request for [place] states that her blood pressure has been running high for 2 months. It is currently 168/90. Translation No Nurse Assessment Nurse: Lolita Lenz, RN, Carlee Date/Time (Eastern Time): 06/11/2022 2:16:39 PM Confirm and document reason for call. If symptomatic, describe symptoms. ---Caller states her blood pressure has been running high for about two months. She takes lisinopril 20mg  once a day. She states she has not been monitoring her blood pressure but she decided to just take her blood pressure and it was 168/90. She states on 8/31 it was 170/89. On 8/25, it was 138/78. Denies any symptoms. No vision issues or headache. Does the patient have any new or worsening symptoms? ---Yes Will a triage be completed? ---Yes Related visit to physician within the last 2 weeks? ---No Does the PT have any chronic conditions? (i.e. diabetes, asthma, this includes High risk factors for pregnancy, etc.) ---Yes List chronic conditions. 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 ---No Guidelines Guideline Title Affirmed Question Affirmed Notes Nurse Date/Time  (Eastern Time) Blood Pressure - High Systolic BP >= 174 OR Diastolic >= 944 Lolita Lenz, RN, Carlee 06/11/2022 2:19:52 PM Disp. Time Eilene Ghazi Time) Disposition Final User 06/11/2022 2:26:42 PM SEE PCP WITHIN 3 DAYS Yes Lolita Lenz, RN, Carlee PLEASE NOTE: All timestamps contained within this report are represented as [place] Standard Time. CONFIDENTIALTY NOTICE: This fax transmission is intended only for the addressee. It contains information that is legally privileged, confidential or otherwise protected from use or disclosure. If you are not the intended recipient, you are strictly prohibited from reviewing, disclosing, copying using or disseminating any of this information or taking any action in reliance on or regarding this information. If you have received this fax in error, please notify us immediately by telephone so that we can arrange for its return to [place]. Phone: [phone], Toll-Free: [phone], Fax: [phone] Page: 2 of 2 Call Id: [id number] Final Disposition 06/11/2022 2:26:42 PM SEE PCP WITHIN 3 DAYS Yes Lolita Lenz, RN, Carlee Caller Disagree/Comply Comply Caller Understands Yes PreDisposition Call Doctor Care Advice Given Per Guideline SEE PCP WITHIN 3 DAYS: * You need to be seen within 2 or 3 days. HIGH BLOOD PRESSURE: * Untreated high blood pressure may cause damage to your heart, brain, kidneys, and eyes. * The goal of blood pressure treatment depends on your age and if you have other health problems. A general goal is less than 130/80. But you should talk to your doctor about a goal that is right for you. CALL BACK IF: * Weakness or numbness of the face, arm or leg on one side of the body occurs * Difficulty walking, difficulty talking, or severe headache occurs * Chest pain or difficulty breathing occurs * Your blood pressure is over 180/110 * You become worse HIGH BLOOD  PRESSURE - LIFESTYLE MODIFICATIONS: * The following things can help you reduce your blood pressure. * EAT  HEALTHY: Eat a diet rich in fresh fruits and vegetables, dietary fiber, non-animal protein (e.g., soy), and low-fat dairy products. Avoid foods with a high content of saturated fat or cholesterol. * DECREASE SODIUM INTAKE: Aim to eat less than 2.4 g (100 mmol) of sodium each day. Unfortunately 75% of the salt in the average person's diet is in pre-processed foods. CARE ADVICE given per High Blood Pressure (Adult) guideline. Comments User: Victorino Dike, RN Date/Time Eilene Ghazi Time): 06/11/2022 2:24:44 PM Did an EKG that was unremarkable on Wednesday User: Victorino Dike, RN Date/Time Eilene Ghazi Time): 06/11/2022 2:27:24 PM Patient will call the office and schedule an appointment within the next three days-- as directives state. Referrals REFERRED TO PCP OFFIC

## 2022-06-12 ENCOUNTER — Other Ambulatory Visit: Payer: Self-pay

## 2022-06-12 ENCOUNTER — Encounter (HOSPITAL_BASED_OUTPATIENT_CLINIC_OR_DEPARTMENT_OTHER): Payer: Self-pay | Admitting: Emergency Medicine

## 2022-06-12 ENCOUNTER — Emergency Department (HOSPITAL_BASED_OUTPATIENT_CLINIC_OR_DEPARTMENT_OTHER)
Admission: EM | Admit: 2022-06-12 | Discharge: 2022-06-12 | Disposition: A | Discharge status: Home / Self Care | Payer: PPO | Attending: Emergency Medicine | Admitting: Emergency Medicine

## 2022-06-12 ENCOUNTER — Emergency Department (HOSPITAL_BASED_OUTPATIENT_CLINIC_OR_DEPARTMENT_OTHER): Payer: PPO

## 2022-06-12 DIAGNOSIS — Z85118 Personal history of other malignant neoplasm of bronchus and lung: Secondary | ICD-10-CM | POA: Diagnosis not present

## 2022-06-12 DIAGNOSIS — R519 Headache, unspecified: Secondary | ICD-10-CM | POA: Insufficient documentation

## 2022-06-12 DIAGNOSIS — Z79899 Other long term (current) drug therapy: Secondary | ICD-10-CM | POA: Diagnosis not present

## 2022-06-12 DIAGNOSIS — I10 Essential (primary) hypertension: Secondary | ICD-10-CM | POA: Insufficient documentation

## 2022-06-12 LAB — CBC WITH DIFFERENTIAL/PLATELET
Abs Immature Granulocytes: 0.02 10*3/uL (ref 0.00–0.07)
Basophils Absolute: 0 10*3/uL (ref 0.0–0.1)
Basophils Relative: 0 %
Eosinophils Absolute: 0.1 10*3/uL (ref 0.0–0.5)
Eosinophils Relative: 1 %
HCT: 42.5 % (ref 36.0–46.0)
Hemoglobin: 14.1 g/dL (ref 12.0–15.0)
Immature Granulocytes: 0 %
Lymphocytes Relative: 17 %
Lymphs Abs: 0.9 10*3/uL (ref 0.7–4.0)
MCH: 29.2 pg (ref 26.0–34.0)
MCHC: 33.2 g/dL (ref 30.0–36.0)
MCV: 88 fL (ref 80.0–100.0)
Monocytes Absolute: 0.4 10*3/uL (ref 0.1–1.0)
Monocytes Relative: 8 %
Neutro Abs: 4.1 10*3/uL (ref 1.7–7.7)
Neutrophils Relative %: 74 %
Platelets: 229 10*3/uL (ref 150–400)
RBC: 4.83 MIL/uL (ref 3.87–5.11)
RDW: 13.4 % (ref 11.5–15.5)
WBC: 5.5 10*3/uL (ref 4.0–10.5)
nRBC: 0 % (ref 0.0–0.2)

## 2022-06-12 LAB — BASIC METABOLIC PANEL
Anion gap: 14 (ref 5–15)
BUN: 12 mg/dL (ref 8–23)
CO2: 25 mmol/L (ref 22–32)
Calcium: 10 mg/dL (ref 8.9–10.3)
Chloride: 103 mmol/L (ref 98–111)
Creatinine, Ser: 0.92 mg/dL (ref 0.44–1.00)
GFR, Estimated: >60 mL/min (ref >60)
Glucose, Bld: 162 mg/dL — ABNORMAL HIGH (ref 70–99)
Potassium: 4.8 mmol/L (ref 3.5–5.1)
Sodium: 142 mmol/L (ref 135–145)

## 2022-06-12 LAB — TSH: TSH: 2.492 u[IU]/mL (ref 0.350–4.500)

## 2022-06-12 LAB — TROPONIN I (HIGH SENSITIVITY): Troponin I (High Sensitivity): 3 ng/L (ref <18)

## 2022-06-12 MED ORDER — AMLODIPINE BESYLATE 5 MG PO TABS
2.5000 mg | ORAL_TABLET | Freq: Once | ORAL | Status: AC
  Administered 2022-06-12: 2.5 mg via ORAL
  Filled 2022-06-12: qty 1

## 2022-06-12 MED ORDER — AMLODIPINE BESYLATE 2.5 MG PO TABS: 2.5000 mg | ORAL_TABLET | Freq: Every day | ORAL | 0 refills | Status: DC

## 2022-06-12 NOTE — Discharge Instructions (Addendum)
Please follow-up with your primary care doctor, if you develop chest pain, shortness of breath, lightheadedness, severe headache difficulty with speech, or weakness on one side of the body please return to the ER.  Please take 5 mg of amlodipine daily (2 pills), if you take your blood pressure tomorrow morning and it is less than 120/80 please only take 1 pill (2.5 mg of amlodipine daily). Start your new medication tomorrow night.

## 2022-06-12 NOTE — ED Provider Notes (Signed)
[facility] EMERGENCY DEPT Provider Note   CSN: [id number] Arrival date & time: 06/12/22  1311     History  Chief Complaint  Patient presents with   Hypertension    Megan [name] is a 71 y.o. female, she of hypertension, lung cancer, who presents to the ED secondary to elevated blood pressures for the last couple weeks.  She states she had COVID about 2 weeks ago, and has had elevated blood pressure since then.  Her blood pressure has been in the 170s, over 90s, but today it was 200/90 so she called the nurse, and was told to come in.  She denies any shortness of breath, weakness, difficulty with speech, dizziness, chest discomfort, back pain, nausea, or vomiting.  She also has no headache.  She has no complaints and just would like her blood pressure evaluated.  She is on lisinopril 20 mg and has been compliant with it.  Of note she is on ciprofloxacin for UTI, but has been on it before, and had no problems with it.    Home Medications Prior to Admission medications   Medication Sig Start Date End Date Taking? Authorizing Provider  amLODipine (NORVASC) 2.5 MG tablet Take 1 tablet (2.5 mg total) by mouth daily. 06/12/22  Yes Tangala Wiegert L, PA  atorvastatin (LIPITOR) 40 MG tablet Take 1 tablet (40 mg total) by mouth daily. For cholesterol. 08/04/21   Pleas Koch, NP  baclofen (LIORESAL) 10 MG tablet TAKE 1 TABLET(10 MG) BY MOUTH THREE TIMES DAILY AS NEEDED FOR MUSCLE SPASMS Patient taking differently: Take 10 mg by mouth 2 (two) times daily. 08/19/21   Pleas Koch, NP  Biotin 5000 MCG CAPS Take 5,000 mcg by mouth every morning.     [provider]  celecoxib (CELEBREX) 200 MG capsule Take 1 capsule (200 mg total) by mouth daily. 05/13/22   Pleas Koch, NP  cetirizine (ZYRTEC) 10 MG tablet Take 1 tablet (10 mg total) by mouth daily. For allergies 01/21/21   Pleas Koch, NP  Cholecalciferol (VITAMIN D) 50 MCG (2000 UT) tablet Take 2,000  Units by mouth daily.    [provider]  ciprofloxacin (CIPRO) 500 MG tablet Take 1 tablet (500 mg total) by mouth 2 (two) times daily. 06/10/22   Lightfoot, Lucile Crater, MD  Coenzyme Q10 (COQ10) 100 MG CAPS Take 100 mg by mouth every evening.    [provider]  esomeprazole (NEXIUM 24HR) 20 MG capsule Take 1 capsule (20 mg total) by mouth daily at 12 noon. 10/05/17   Pleas Koch, NP  fluocinonide cream (LIDEX) 0.63 % Apply 1 Application topically 2 (two) times daily as needed (irritation). 02/25/22   [provider]  gabapentin (NEURONTIN) 300 MG capsule Take 1 capsule (300 mg total) by mouth 2 (two) times daily. For pain. 07/31/21   Pleas Koch, NP  ibuprofen (ADVIL) 200 MG tablet Take 600 mg by mouth every 8 (eight) hours as needed (pain).    [provider]  lisinopril (ZESTRIL) 20 MG tablet TAKE 1 TABLET(20 MG) BY MOUTH DAILY FOR BLOOD PRESSURE 02/01/22   Pleas Koch, NP  metFORMIN (GLUCOPHAGE) 500 MG tablet TAKE 1 TABLET BY MOUTH TWICE DAILY FOR DIABETES 02/16/22   Pleas Koch, NP  nystatin-triamcinolone ointment [facility]) Apply 1 Application topically 2 (two) times daily as needed (irritation). 02/22/22   [provider]  oxybutynin (DITROPAN-XL) 5 MG 24 hr tablet TAKE 1 TABLET(5 MG) BY MOUTH AT BEDTIME FOR  OVERACTIVE BLADDER 06/03/22   Pleas Koch, NP  oxybutynin (OXYTROL) 3.9 MG/24HR Place 1 patch onto the skin See admin instructions. Change every 5 days    [provider]  Polyethyl Glycol-Propyl Glycol (LUBRICANT EYE DROPS) 0.4-0.3 % SOLN Place 1-2 drops into both eyes 2 (two) times daily.    [provider]  senna-docusate (SENOKOT-S) 8.6-50 MG tablet Take 2 tablets by mouth 2 (two) times daily. Patient taking differently: Take 1 tablet by mouth every other day. 01/04/17   Angiulli, Lavon Paganini, PA-C      Allergies    Shellfish allergy, Oxycodone, and Penicillins    Review of Systems   Review of  Systems  Respiratory:  Negative for shortness of breath.   Cardiovascular:  Negative for chest pain, palpitations and leg swelling.  Gastrointestinal:  Negative for abdominal pain.    Physical Exam Updated Vital Signs BP (!) 176/93   Pulse 68   Temp 98.2 F (36.8 C)   Resp 15   SpO2 98%  Physical Exam Vitals and nursing note reviewed.  Constitutional:      General: She is not in acute distress.    Appearance: She is well-developed.  HENT:     Head: Normocephalic and atraumatic.  Eyes:     Conjunctiva/sclera: Conjunctivae normal.  Cardiovascular:     Rate and Rhythm: Normal rate and regular rhythm.     Heart sounds: No murmur heard. Pulmonary:     Effort: Pulmonary effort is normal. No respiratory distress.     Breath sounds: Normal breath sounds.  Abdominal:     Palpations: Abdomen is soft.     Tenderness: There is no abdominal tenderness.  Musculoskeletal:        General: No swelling.     Cervical back: Neck supple.  Skin:    General: Skin is warm and dry.     Capillary Refill: Capillary refill takes less than 2 seconds.  Neurological:     Mental Status: She is alert.  Psychiatric:        Mood and Affect: Mood normal.    ED Results / Procedures / Treatments   Labs (all labs ordered are listed, but only abnormal results are displayed) Labs Reviewed  BASIC METABOLIC PANEL - Abnormal; Notable for the following components:      Result Value   Glucose, Bld 162 (*)    All other components within normal limits  CBC WITH DIFFERENTIAL/PLATELET  TSH  TROPONIN I (HIGH SENSITIVITY)    EKG EKG Interpretation  Date/Time:  Saturday June 12 2022 14:27:34 EDT Ventricular Rate:  86 PR Interval:  150 QRS Duration: 84 QT Interval:  366 QTC Calculation: 437 R Axis:   -54 Text Interpretation: Normal sinus rhythm Left axis deviation Anterior infarct (cited on or before 02-Jun-2022) Abnormal ECG When compared with ECG of 02-Jun-2022 10:00, No significant change was  found No significant change was found Confirmed by Ezequiel Essex [phone]) on 06/12/2022 5:46:29 PM  Radiology CT Head Wo Contrast  Result Date: 06/12/2022 CLINICAL DATA:  Headache and mild blurry vision EXAM: CT HEAD WITHOUT CONTRAST TECHNIQUE: Contiguous axial images were obtained from the base of the skull through the vertex without intravenous contrast. RADIATION DOSE REDUCTION: This exam was performed according to the departmental dose-optimization program which includes automated exposure control, adjustment of the mA and/or kV according to patient size and/or use of iterative reconstruction technique. COMPARISON:  MRI head 05/22/2022 FINDINGS: Brain: No intracranial hemorrhage, mass effect, or evidence of acute infarct.  No hydrocephalus. No extra-axial fluid collection. Mild cerebral atrophy and chronic Kiona Blume vessel ischemic disease. Vascular: No hyperdense vessel. Intracranial arterial calcification. Skull: No fracture or focal lesion. Sinuses/Orbits: No acute finding. Paranasal sinuses and mastoid air cells are well aerated. Other: None. IMPRESSION: No acute intracranial abnormality. Electronically Signed   By: Placido Sou M.D.   On: 06/12/2022 19:29    Procedures Procedures   Medications Ordered in ED Medications  amLODipine (NORVASC) tablet 2.5 mg (2.5 mg Oral Given 06/12/22 1838)  amLODipine (NORVASC) tablet 2.5 mg (2.5 mg Oral Given 06/12/22 2056)    ED Course/ Medical Decision Making/ A&P                           Medical Decision Making Amount and/or Complexity of Data Reviewed Radiology: ordered.  Risk Prescription drug management.   This patient presents to the ED for concern of high blood pressure   Co morbidities that complicate the patient evaluation  Diabetes, hypertension   Lab Tests:  I Ordered, and personally interpreted labs.  The pertinent results include:  no evidence of electrolyte abnormalities or leukocytosis. Troponin of 3.    Imaging Studies  ordered:  I ordered imaging studies including  unremarkable CT head and CXR    Cardiac Monitoring: / EKG:  The patient was maintained on a cardiac monitor.  I personally viewed and interpreted the cardiac monitored which showed an underlying rhythm of: normal sinus rhythm   Consultations Obtained:  I requested consultation with the Dr. Wyvonnia Dusky,  and discussed lab and imaging findings as well as pertinent plan - they recommend: head CT   Problem List / ED Course / Critical interventions / Medication management  I ordered medication including amlodipine  for hypertension  Reevaluation of the patient after these medicines showed that the patient improved I have reviewed the patients home medicines and have made adjustments as needed  Test / Admission - Considered:  Patient is 71 year old female, here for high blood pressure, she has no chest pain, shortness of breath, dizziness, headache, abdominal pain.  Found to have elevated blood pressure, given amlodipine 5 mg total, with some improvement, of blood pressure, however still elevated.  She and I discussed the onset of amlodipine, and need for for several more hours.  She would like to go home at this time, and check her blood pressure in the morning.  She is agreeable to this.  She was sent amlodipine 5 mg outpatient, in the form of 2.5 mg that she can take up to twice a day.  Depending on how her blood pressure is in the morning.  Advised patient that if blood pressure is low tomorrow, to only take 2.5 mg daily in addition to her lisinopril.  She voiced understanding.  Discussed importance of appropriate blood pressures to avoid falls, however to avoid further damage as well.  Balance is key.  Discussed follow-up with primary care and return symptoms.  Final Clinical Impression(s) / ED Diagnoses Final diagnoses:  Essential hypertension    Rx / DC Orders ED Discharge Orders          Ordered    amLODipine (NORVASC) 2.5 MG tablet   Daily        06/12/22 2128              Safia Panzer, Si Gaul, [place] 06/13/22 0112    Ezequiel Essex, MD 06/13/22 1601

## 2022-06-12 NOTE — ED Triage Notes (Signed)
Pt started on antibiotic for UTI, still on it. Covid + on the 20th, asymptomatic, has a nodule on lung that is cancer and is scheduled for surgery.

## 2022-06-12 NOTE — ED Provider Triage Note (Signed)
Emergency Medicine Provider Triage Evaluation Note  Megan [name] , a 71 y.o. female  was evaluated in triage.  Pt complains of asxs htn. Bp > 200 today. Told to come to ER by nurse line. Mild blurry vision. Taking meds. Scheduled for lung surgery due to lung cancer. + for asxs covid.  Review of Systems  Positive: htn Negative: Neuro deficits  Physical Exam  BP (!) 182/84   Pulse 87   Temp 98 F (36.7 C) (Oral)   Resp 16   SpO2 98%  Gen:   Awake, no distress   Resp:  Normal effort  MSK:   Moves extremities without difficulty  Other:    Medical Decision Making  Medically screening exam initiated at 2:18 PM.  Appropriate orders placed.  RAMATA STROTHMAN was informed that the remainder of the evaluation will be completed by another provider, this initial triage assessment does not replace that evaluation, and the importance of remaining in the ED until their evaluation is complete.  Work up [employer], [place], PA-C 06/12/22 1420

## 2022-06-12 NOTE — ED Triage Notes (Signed)
Pts blood pressure has been slowly increasing in the past 2 weeks. Today was in the 850'Y systolic, she has appointment with her MD Monday but the nurse line told her to come to ER.

## 2022-06-14 ENCOUNTER — Encounter (HOSPITAL_COMMUNITY)
Admission: RE | Admit: 2022-06-14 | Discharge: 2022-06-14 | Disposition: A | Discharge status: Home / Self Care | Payer: PPO | Source: Ambulatory Visit | Attending: Thoracic Surgery (Cardiothoracic Vascular Surgery) | Admitting: Thoracic Surgery (Cardiothoracic Vascular Surgery)

## 2022-06-14 ENCOUNTER — Other Ambulatory Visit: Payer: Self-pay

## 2022-06-14 ENCOUNTER — Other Ambulatory Visit: Payer: Self-pay | Admitting: Family Medicine

## 2022-06-14 ENCOUNTER — Ambulatory Visit (HOSPITAL_COMMUNITY)
Admission: RE | Admit: 2022-06-14 | Discharge: 2022-06-14 | Disposition: A | Discharge status: Home / Self Care | Payer: PPO | Source: Ambulatory Visit | Attending: Thoracic Surgery (Cardiothoracic Vascular Surgery) | Admitting: Thoracic Surgery (Cardiothoracic Vascular Surgery)

## 2022-06-14 ENCOUNTER — Ambulatory Visit (INDEPENDENT_AMBULATORY_CARE_PROVIDER_SITE_OTHER): Payer: PPO | Admitting: Family Medicine

## 2022-06-14 VITALS — BP 152/82 | HR 80 | Temp 98.3°F | Resp 14 | Ht 68.0 in | Wt 172.4 lb

## 2022-06-14 VITALS — BP 150/80 | HR 100 | Temp 97.5°F | Ht 67.0 in | Wt 172.2 lb

## 2022-06-14 DIAGNOSIS — J95812 Postprocedural air leak: Secondary | ICD-10-CM | POA: Diagnosis not present

## 2022-06-14 DIAGNOSIS — J439 Emphysema, unspecified: Secondary | ICD-10-CM | POA: Diagnosis present

## 2022-06-14 DIAGNOSIS — C801 Malignant (primary) neoplasm, unspecified: Secondary | ICD-10-CM | POA: Diagnosis not present

## 2022-06-14 DIAGNOSIS — Z825 Family history of asthma and other chronic lower respiratory diseases: Secondary | ICD-10-CM | POA: Diagnosis not present

## 2022-06-14 DIAGNOSIS — Z981 Arthrodesis status: Secondary | ICD-10-CM | POA: Diagnosis not present

## 2022-06-14 DIAGNOSIS — R911 Solitary pulmonary nodule: Secondary | ICD-10-CM | POA: Insufficient documentation

## 2022-06-14 DIAGNOSIS — I951 Orthostatic hypotension: Secondary | ICD-10-CM | POA: Diagnosis not present

## 2022-06-14 DIAGNOSIS — Z791 Long term (current) use of non-steroidal anti-inflammatories (NSAID): Secondary | ICD-10-CM | POA: Diagnosis not present

## 2022-06-14 DIAGNOSIS — E1142 Type 2 diabetes mellitus with diabetic polyneuropathy: Secondary | ICD-10-CM | POA: Insufficient documentation

## 2022-06-14 DIAGNOSIS — D62 Acute posthemorrhagic anemia: Secondary | ICD-10-CM | POA: Diagnosis not present

## 2022-06-14 DIAGNOSIS — Z87891 Personal history of nicotine dependence: Secondary | ICD-10-CM | POA: Diagnosis not present

## 2022-06-14 DIAGNOSIS — I10 Essential (primary) hypertension: Secondary | ICD-10-CM

## 2022-06-14 DIAGNOSIS — E785 Hyperlipidemia, unspecified: Secondary | ICD-10-CM | POA: Diagnosis present

## 2022-06-14 DIAGNOSIS — Z85118 Personal history of other malignant neoplasm of bronchus and lung: Secondary | ICD-10-CM | POA: Insufficient documentation

## 2022-06-14 DIAGNOSIS — R110 Nausea: Secondary | ICD-10-CM | POA: Diagnosis not present

## 2022-06-14 DIAGNOSIS — Z79899 Other long term (current) drug therapy: Secondary | ICD-10-CM | POA: Diagnosis not present

## 2022-06-14 DIAGNOSIS — J9811 Atelectasis: Secondary | ICD-10-CM | POA: Diagnosis not present

## 2022-06-14 DIAGNOSIS — Z01812 Encounter for preprocedural laboratory examination: Secondary | ICD-10-CM | POA: Insufficient documentation

## 2022-06-14 DIAGNOSIS — Z880 Allergy status to penicillin: Secondary | ICD-10-CM | POA: Diagnosis not present

## 2022-06-14 DIAGNOSIS — C3492 Malignant neoplasm of unspecified part of left bronchus or lung: Secondary | ICD-10-CM | POA: Diagnosis not present

## 2022-06-14 DIAGNOSIS — Z01818 Encounter for other preprocedural examination: Secondary | ICD-10-CM

## 2022-06-14 DIAGNOSIS — Z8542 Personal history of malignant neoplasm of other parts of uterus: Secondary | ICD-10-CM | POA: Diagnosis not present

## 2022-06-14 DIAGNOSIS — E119 Type 2 diabetes mellitus without complications: Secondary | ICD-10-CM | POA: Diagnosis present

## 2022-06-14 DIAGNOSIS — Z833 Family history of diabetes mellitus: Secondary | ICD-10-CM | POA: Diagnosis not present

## 2022-06-14 DIAGNOSIS — J449 Chronic obstructive pulmonary disease, unspecified: Secondary | ICD-10-CM | POA: Diagnosis not present

## 2022-06-14 DIAGNOSIS — Z885 Allergy status to narcotic agent status: Secondary | ICD-10-CM | POA: Diagnosis not present

## 2022-06-14 DIAGNOSIS — C3432 Malignant neoplasm of lower lobe, left bronchus or lung: Secondary | ICD-10-CM | POA: Diagnosis present

## 2022-06-14 DIAGNOSIS — Z7984 Long term (current) use of oral hypoglycemic drugs: Secondary | ICD-10-CM | POA: Diagnosis not present

## 2022-06-14 DIAGNOSIS — J939 Pneumothorax, unspecified: Secondary | ICD-10-CM | POA: Diagnosis not present

## 2022-06-14 DIAGNOSIS — Z91013 Allergy to seafood: Secondary | ICD-10-CM | POA: Diagnosis not present

## 2022-06-14 DIAGNOSIS — K219 Gastro-esophageal reflux disease without esophagitis: Secondary | ICD-10-CM | POA: Diagnosis present

## 2022-06-14 LAB — TYPE AND SCREEN
ABO/RH(D): A POS
ABO/RH(D): A POS
Antibody Screen: NEGATIVE
Antibody Screen: NEGATIVE

## 2022-06-14 LAB — URINALYSIS, ROUTINE W REFLEX MICROSCOPIC
Bilirubin Urine: NEGATIVE
Glucose, UA: NEGATIVE mg/dL
Hgb urine dipstick: NEGATIVE
Ketones, ur: NEGATIVE mg/dL
Leukocytes,Ua: NEGATIVE
Nitrite: POSITIVE — AB
Protein, ur: NEGATIVE mg/dL
Specific Gravity, Urine: 1.01 (ref 1.005–1.030)
pH: 5 (ref 5.0–8.0)

## 2022-06-14 LAB — COMPREHENSIVE METABOLIC PANEL
ALT: 26 U/L (ref 0–44)
AST: 23 U/L (ref 15–41)
Albumin: 4 g/dL (ref 3.5–5.0)
Alkaline Phosphatase: 90 U/L (ref 38–126)
Anion gap: 10 (ref 5–15)
BUN: 13 mg/dL (ref 8–23)
CO2: 23 mmol/L (ref 22–32)
Calcium: 9.2 mg/dL (ref 8.9–10.3)
Chloride: 109 mmol/L (ref 98–111)
Creatinine, Ser: 1 mg/dL (ref 0.44–1.00)
GFR, Estimated: >60 mL/min (ref >60)
Glucose, Bld: 149 mg/dL — ABNORMAL HIGH (ref 70–99)
Potassium: 4.1 mmol/L (ref 3.5–5.1)
Sodium: 142 mmol/L (ref 135–145)
Total Bilirubin: 0.5 mg/dL (ref 0.3–1.2)
Total Protein: 7.2 g/dL (ref 6.5–8.1)

## 2022-06-14 LAB — APTT: aPTT: 25 seconds (ref 24–36)

## 2022-06-14 LAB — PROTIME-INR
INR: 1 (ref 0.8–1.2)
Prothrombin Time: 13.4 seconds (ref 11.4–15.2)

## 2022-06-14 LAB — GLUCOSE, CAPILLARY: Glucose-Capillary: 150 mg/dL — ABNORMAL HIGH (ref 70–99)

## 2022-06-14 MED ORDER — AMLODIPINE BESYLATE 5 MG PO TABS: 5.0000 mg | ORAL_TABLET | Freq: Every day | ORAL | 1 refills | Status: DC

## 2022-06-14 NOTE — Telephone Encounter (Signed)
Patient has an appointment today 08/14/22 with Dr. Einar Pheasant.

## 2022-06-14 NOTE — Telephone Encounter (Signed)
PLEASE NOTE: All timestamps contained within this report are represented as [place] Standard Time. CONFIDENTIALTY NOTICE: This fax transmission is intended only for the addressee. It contains information that is legally privileged, confidential or otherwise protected from use or disclosure. If you are not the intended recipient, you are strictly prohibited from reviewing, disclosing, copying using or disseminating any of this information or taking any action in reliance on or regarding this information. If you have received this fax in error, please notify us immediately by telephone so that we can arrange for its return to [place]. Phone: [phone], Toll-Free: [phone], Fax: [phone] Page: 1 of 2 Call Id: [id number] Dixie RECORD Access[employer]urse Patient Name: Megan [name] Gender: Female DOB: 02/10/1951 Age: 71 Y 67 M 16 D Return Phone Number: [phone] (Primary), [phone] (Secondary) Address[place]  [address] Client [facility] Night - Client Client Site [place] - Night Provider Alma Friendly - NP Contact Type Call Who Is Calling Patient / Member / Family / Caregiver Call Type Triage / Clinical Relationship To Patient Self Return Phone Number [phone] (Primary) Chief Complaint Blood Pressure High Reason for Call Symptomatic / Request for Vamo states that her BP 196/96. Caller states that she called yesterday and made an appt but it has increased since yesterday. Caller states that she takes her BP medication in the evening. Translation No Nurse Assessment Nurse: Arvella Nigh, RN, Lubertha Basque Date/Time (Eastern Time): 06/12/2022 12:30:04 PM Confirm and document reason for call. If symptomatic, describe symptoms. ---Caller states that her BP 196/96 and 20 min ago and now 208/102. Caller states she is  experiencing some mild dizziness upon standing and blurred vision that started this morning. States she take her BP medication in the evening. Does the patient have any new or worsening symptoms? ---Yes Will a triage be completed? ---Yes Related visit to physician within the last 2 weeks? ---No Does the PT have any chronic conditions? (i.e. diabetes, asthma, this includes High risk factors for pregnancy, etc.) ---Yes List chronic conditions. ---cancer htn Is this a behavioral health or substance abuse call? ---No Guidelines Guideline Title Affirmed Question Affirmed Notes Nurse Date/Time (Eastern Time) Blood Pressure - High [1] Systolic BP >= 062 OR Diastolic >= 694 AND [8] cardiac (e.g., breathing difficulty, chest pain) or neurologic symptoms (e.g., Arvella Nigh, RN, Lubertha Basque 06/12/2022 12:33:59 PM PLEASE NOTE: All timestamps contained within this report are represented as [place] Standard Time. CONFIDENTIALTY NOTICE: This fax transmission is intended only for the addressee. It contains information that is legally privileged, confidential or otherwise protected from use or disclosure. If you are not the intended recipient, you are strictly prohibited from reviewing, disclosing, copying using or disseminating any of this information or taking any action in reliance on or regarding this information. If you have received this fax in error, please notify us immediately by telephone so that we can arrange for its return to [place]. Phone: [phone], Toll-Free: [phone], Fax: [phone] Page: 2 of 2 Call Id: [id number] Guidelines Guideline Title Affirmed Question Affirmed Notes Nurse Date/Time Eilene Ghazi Time) new-onset blurred or double vision, unsteady gait) Disp. Time Eilene Ghazi Time) Disposition Final User 06/12/2022 12:36:11 PM Go to ED Now Yes Arvella Nigh, RN, Lubertha Basque Final Disposition 06/12/2022 12:36:11 PM Go to ED Now Yes Arvella Nigh, RN, Rozanna Boer Disagree/Comply Comply Caller  Understands Yes PreDisposition InappropriateToAsk Care Advice Given Per Guideline GO TO ED NOW: * You need to  be seen in the Emergency Department. NOTE TO TRIAGER - DRIVING: * Another adult should drive. * Leave now. Drive carefully. CALL EMS 911 IF: * Passes out or faints * Becomes too weak to stand * You become worse CARE ADVICE given per High Blood Pressure (Adult) guideline. Referrals [facility] - ED

## 2022-06-14 NOTE — Progress Notes (Signed)
PCP - Alma Friendly Cardiologist - denies until now Pulmonology: Icard Erline Levine did back surgeries  PPM/ICD - denies   Chest x-ray - 06/14/22 EKG - 06/02/22 Stress Test - 05/25/22 ECHO - denies Cardiac Cath - denies  Sleep Study - denies   Fasting Blood Sugar - 100-140 Checks Blood Sugar 2 times a day  Follow your surgeon's instructions on when to stop Aspirin.  If no instructions were given by your surgeon then you will need to call the office to get those instructions.     ERAS Protcol -no   COVID TEST- tested positive on 06/02/22. No need to retest today   Anesthesia review: yes, cardiac history  Patient denies shortness of breath, fever, cough and chest pain at PAT appointment   All instructions explained to the patient, with a verbal understanding of the material. Patient agrees to go over the instructions while at home for a better understanding. Patient also instructed to self quarantine after being tested for COVID-19. The opportunity to ask questions was provided.

## 2022-06-14 NOTE — Assessment & Plan Note (Signed)
Suspect stress may be complicating her elevated blood pressure.  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  BP has been elevated over the last few months will ongoing evaluation.  Mild improvement with amlodipine 2.5, will increase to 5 mg.  Continue lisinopril 20 mg.  Schedule follow-up with PCP for 2 weeks.

## 2022-06-14 NOTE — Patient Instructions (Signed)
Hypertension - continue lisinopril 20 mg - Increase amlodipine to 5 mg (new prescription)  - Monitor blood pressure at home - Reduce amlodipine to 1/2 tablet (2.5 mg) if blood pressure or feeling lightheaded or dizzy

## 2022-06-14 NOTE — Assessment & Plan Note (Signed)
Surgery was postponed 9/20 due to positive COVID test, blood pressure has been elevated.  She is getting surgery on Wednesday.  Advised close monitoring of blood pressure to see if this improves postsurgery.

## 2022-06-14 NOTE — Progress Notes (Signed)
Subjective:     Megan [name] is a 71 y.o. female presenting for Hypertension     HPI  #HTN - over the last month since cancer diagnosis has noticed that her bp has high - prior to the last month home BP was 130-140/70-80 - but in the last month has had 150-200/90-100 - no cp, ha, blurry vision - screening positive for Covid 9/20 and this postponed her surgery for lung cancer - she denies any anxiety/stress regarding this   Review of Systems  06/12/2022: ER - HTN w/o stroke symptoms. start amlodipine 2.5-5mg  daily  Social History   Tobacco Use  Smoking Status Former   Packs/day: 1.50   Years: 35.00   Total pack years: 52.50   Types: Cigarettes   Quit date: 05/13/2006   Years since quitting: 16.0  Smokeless Tobacco Never        Objective:    BP Readings from Last 3 Encounters:  06/14/22 (!) 150/80  06/12/22 (!) 176/93  06/02/22 (!) 170/90   Wt Readings from Last 3 Encounters:  06/14/22 172 lb 4 oz (78.1 kg)  06/02/22 174 lb (78.9 kg)  05/25/22 174 lb (78.9 kg)    BP (!) 150/80   Pulse 100   Temp (!) 97.5 F (36.4 C) (Temporal)   Ht 5\' 7"  (1.702 m)   Wt 172 lb 4 oz (78.1 kg)   SpO2 98%   BMI 26.98 kg/m    Physical Exam Constitutional:      General: She is not in acute distress.    Appearance: She is well-developed. She is not diaphoretic.  HENT:     Right Ear: External ear normal.     Left Ear: External ear normal.     Nose: Nose normal.  Eyes:     Conjunctiva/sclera: Conjunctivae normal.  Cardiovascular:     Rate and Rhythm: Normal rate and regular rhythm.     Heart sounds: No murmur heard. Pulmonary:     Effort: Pulmonary effort is normal. No respiratory distress.     Breath sounds: Normal breath sounds. No wheezing.  Musculoskeletal:     Cervical back: Neck supple.  Skin:    General: Skin is warm and dry.     Capillary Refill: Capillary refill takes less than 2 seconds.  Neurological:     Mental Status: She is alert. Mental  status is at baseline.  Psychiatric:        Mood and Affect: Mood normal.        Behavior: Behavior normal.           Assessment & Plan:   Problem List Items Addressed This Visit       Cardiovascular and Mediastinum   Essential hypertension - Primary    Suspect stress may be complicating her elevated blood pressure.  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  BP has been elevated over the last few months will ongoing evaluation.  Mild improvement with amlodipine 2.5, will increase to 5 mg.  Continue lisinopril 20 mg.  Schedule follow-up with PCP for 2 weeks.      Relevant Medications   amLODipine (NORVASC) 5 MG tablet     Other   Adenocarcinoma ([employer])    Surgery was postponed 9/20 due to positive COVID test, blood pressure has been elevated.  She is getting surgery on Wednesday.  Advised close monitoring of blood pressure to see if this improves postsurgery.  Return in about 2 weeks (around 06/28/2022) for [facility] for HTN.  Lesleigh Noe, MD

## 2022-06-14 NOTE — Telephone Encounter (Signed)
Noted will evaluate today

## 2022-06-15 ENCOUNTER — Encounter (HOSPITAL_COMMUNITY): Payer: Self-pay | Admitting: Thoracic Surgery (Cardiothoracic Vascular Surgery)

## 2022-06-15 NOTE — Anesthesia Preprocedure Evaluation (Signed)
Anesthesia Evaluation  Patient identified by MRN, date of birth, ID band Patient awake    Reviewed: Allergy & Precautions, NPO status , Patient's Chart, lab work & pertinent test results  History of Anesthesia Complications (+) PONV and history of anesthetic complications  Airway Mallampati: III  TM Distance: >3 FB Neck ROM: Limited    Dental  (+) Lower Dentures, Upper Dentures   Pulmonary COPD,  COPD inhaler, former smoker  LLL cancer    Pulmonary exam normal        Cardiovascular hypertension, Pt. on medications Normal cardiovascular exam   '23 Myoperfusion - The study is normal. The study is low risk.   No ST deviation was noted.   LV perfusion is normal.   Left ventricular function is normal. End diastolic cavity size is normal.   Prior study not available for comparison.    Neuro/Psych  PSYCHIATRIC DISORDERS  Depression     Neuromuscular disease (paraparesis)    GI/Hepatic Neg liver ROS,GERD  Medicated and Controlled,,  Endo/Other  diabetes, Type 2, Oral Hypoglycemic Agents    Renal/GU negative Renal ROS     Musculoskeletal  (+) Arthritis ,    Abdominal   Peds  Hematology negative hematology ROS (+)   Anesthesia Other Findings   Reproductive/Obstetrics  Uterine cancer                             Anesthesia Physical Anesthesia Plan  ASA: 3  Anesthesia Plan: General   Post-op Pain Management: Tylenol PO (pre-op)* and Celebrex PO (pre-op)*   Induction: Intravenous  PONV Risk Score and Plan: 4 or greater and Treatment may vary due to age or medical condition, Ondansetron, Propofol infusion and Dexamethasone  Airway Management Planned: Double Lumen EBT  Additional Equipment: ClearSight  Intra-op Plan:   Post-operative Plan: Extubation in OR  Informed Consent: I have reviewed the patients History and Physical, chart, labs and discussed the procedure including the  risks, benefits and alternatives for the proposed anesthesia with the patient or authorized representative who has indicated his/her understanding and acceptance.     Dental advisory given  Plan Discussed with: CRNA and Anesthesiologist  Anesthesia Plan Comments: (Vivasight and ClearSight, 2 large bore PIV )        Anesthesia Quick Evaluation

## 2022-06-16 ENCOUNTER — Encounter (HOSPITAL_COMMUNITY): Payer: Self-pay | Admitting: Thoracic Surgery (Cardiothoracic Vascular Surgery)

## 2022-06-16 ENCOUNTER — Inpatient Hospital Stay (HOSPITAL_COMMUNITY)
Admission: RE | Admit: 2022-06-16 | Discharge: 2022-06-21 | DRG: 164 | Disposition: A | Discharge status: Home / Self Care | Payer: PPO | Attending: Thoracic Surgery (Cardiothoracic Vascular Surgery) | Admitting: Thoracic Surgery (Cardiothoracic Vascular Surgery)

## 2022-06-16 ENCOUNTER — Inpatient Hospital Stay (HOSPITAL_COMMUNITY): Payer: PPO

## 2022-06-16 ENCOUNTER — Other Ambulatory Visit: Payer: Self-pay

## 2022-06-16 ENCOUNTER — Encounter (HOSPITAL_COMMUNITY)
Admission: RE | Disposition: A | Discharge status: Home / Self Care | Payer: Self-pay | Source: Home / Self Care | Attending: Thoracic Surgery (Cardiothoracic Vascular Surgery)

## 2022-06-16 ENCOUNTER — Inpatient Hospital Stay (HOSPITAL_COMMUNITY): Payer: PPO | Admitting: Anesthesiology

## 2022-06-16 DIAGNOSIS — R911 Solitary pulmonary nodule: Secondary | ICD-10-CM

## 2022-06-16 DIAGNOSIS — Z880 Allergy status to penicillin: Secondary | ICD-10-CM

## 2022-06-16 DIAGNOSIS — Z825 Family history of asthma and other chronic lower respiratory diseases: Secondary | ICD-10-CM

## 2022-06-16 DIAGNOSIS — D62 Acute posthemorrhagic anemia: Secondary | ICD-10-CM | POA: Diagnosis not present

## 2022-06-16 DIAGNOSIS — C3432 Malignant neoplasm of lower lobe, left bronchus or lung: Principal | ICD-10-CM | POA: Diagnosis present

## 2022-06-16 DIAGNOSIS — Z885 Allergy status to narcotic agent status: Secondary | ICD-10-CM

## 2022-06-16 DIAGNOSIS — M62838 Other muscle spasm: Secondary | ICD-10-CM

## 2022-06-16 DIAGNOSIS — R110 Nausea: Secondary | ICD-10-CM | POA: Diagnosis not present

## 2022-06-16 DIAGNOSIS — Z79899 Other long term (current) drug therapy: Secondary | ICD-10-CM | POA: Diagnosis not present

## 2022-06-16 DIAGNOSIS — I10 Essential (primary) hypertension: Secondary | ICD-10-CM

## 2022-06-16 DIAGNOSIS — I951 Orthostatic hypotension: Secondary | ICD-10-CM | POA: Diagnosis not present

## 2022-06-16 DIAGNOSIS — Z833 Family history of diabetes mellitus: Secondary | ICD-10-CM | POA: Diagnosis not present

## 2022-06-16 DIAGNOSIS — K219 Gastro-esophageal reflux disease without esophagitis: Secondary | ICD-10-CM | POA: Diagnosis present

## 2022-06-16 DIAGNOSIS — J449 Chronic obstructive pulmonary disease, unspecified: Secondary | ICD-10-CM

## 2022-06-16 DIAGNOSIS — Z91013 Allergy to seafood: Secondary | ICD-10-CM | POA: Diagnosis not present

## 2022-06-16 DIAGNOSIS — Z87891 Personal history of nicotine dependence: Secondary | ICD-10-CM | POA: Diagnosis not present

## 2022-06-16 DIAGNOSIS — Z902 Acquired absence of lung [part of]: Principal | ICD-10-CM

## 2022-06-16 DIAGNOSIS — Z7984 Long term (current) use of oral hypoglycemic drugs: Secondary | ICD-10-CM

## 2022-06-16 DIAGNOSIS — J95812 Postprocedural air leak: Secondary | ICD-10-CM | POA: Diagnosis not present

## 2022-06-16 DIAGNOSIS — J9811 Atelectasis: Secondary | ICD-10-CM | POA: Diagnosis not present

## 2022-06-16 DIAGNOSIS — Z981 Arthrodesis status: Secondary | ICD-10-CM | POA: Diagnosis not present

## 2022-06-16 DIAGNOSIS — Z791 Long term (current) use of non-steroidal anti-inflammatories (NSAID): Secondary | ICD-10-CM

## 2022-06-16 DIAGNOSIS — E119 Type 2 diabetes mellitus without complications: Secondary | ICD-10-CM | POA: Diagnosis present

## 2022-06-16 DIAGNOSIS — E785 Hyperlipidemia, unspecified: Secondary | ICD-10-CM | POA: Diagnosis present

## 2022-06-16 DIAGNOSIS — J439 Emphysema, unspecified: Secondary | ICD-10-CM | POA: Diagnosis present

## 2022-06-16 DIAGNOSIS — Z8542 Personal history of malignant neoplasm of other parts of uterus: Secondary | ICD-10-CM | POA: Diagnosis not present

## 2022-06-16 DIAGNOSIS — E1142 Type 2 diabetes mellitus with diabetic polyneuropathy: Secondary | ICD-10-CM

## 2022-06-16 HISTORY — PX: LYMPH NODE DISSECTION: SHX5087

## 2022-06-16 HISTORY — PX: INTERCOSTAL NERVE BLOCK: SHX5021

## 2022-06-16 LAB — GLUCOSE, CAPILLARY
Glucose-Capillary: 157 mg/dL — ABNORMAL HIGH (ref 70–99)
Glucose-Capillary: 211 mg/dL — ABNORMAL HIGH (ref 70–99)
Glucose-Capillary: 212 mg/dL — ABNORMAL HIGH (ref 70–99)
Glucose-Capillary: 253 mg/dL — ABNORMAL HIGH (ref 70–99)
Glucose-Capillary: 270 mg/dL — ABNORMAL HIGH (ref 70–99)
Glucose-Capillary: 299 mg/dL — ABNORMAL HIGH (ref 70–99)

## 2022-06-16 SURGERY — LOBECTOMY, LUNG, ROBOT-ASSISTED, USING VATS
Anesthesia: General | Site: Chest | Laterality: Left

## 2022-06-16 MED ORDER — ACETAMINOPHEN 160 MG/5ML PO SOLN: 1000.0000 mg | Freq: Four times a day (QID) | ORAL | Status: DC

## 2022-06-16 MED ORDER — VANCOMYCIN HCL IN DEXTROSE 1-5 GM/200ML-% IV SOLN
1000.0000 mg | INTRAVENOUS | Status: AC
  Administered 2022-06-16: 1000 mg via INTRAVENOUS

## 2022-06-16 MED ORDER — PHENYLEPHRINE 80 MCG/ML (10ML) SYRINGE FOR IV PUSH (FOR BLOOD PRESSURE SUPPORT)
PREFILLED_SYRINGE | INTRAVENOUS | Status: DC | PRN
  Administered 2022-06-16 (×8): 80 ug via INTRAVENOUS

## 2022-06-16 MED ORDER — PROPOFOL 10 MG/ML IV BOLUS
INTRAVENOUS | Status: AC
  Filled 2022-06-16: qty 20

## 2022-06-16 MED ORDER — ACETAMINOPHEN 500 MG PO TABS: 1000.0000 mg | ORAL_TABLET | Freq: Once | ORAL | Status: AC

## 2022-06-16 MED ORDER — 0.9 % SODIUM CHLORIDE (POUR BTL) OPTIME
TOPICAL | Status: DC | PRN
  Administered 2022-06-16: 1000 mL

## 2022-06-16 MED ORDER — SENNOSIDES-DOCUSATE SODIUM 8.6-50 MG PO TABS
1.0000 | ORAL_TABLET | Freq: Every day | ORAL | Status: DC
  Administered 2022-06-16 – 2022-06-20 (×5): 1 via ORAL
  Filled 2022-06-16 (×5): qty 1

## 2022-06-16 MED ORDER — INSULIN ASPART 100 UNIT/ML IJ SOLN
0.0000 [IU] | INTRAMUSCULAR | Status: DC
  Administered 2022-06-16 (×2): 12 [IU] via SUBCUTANEOUS
  Administered 2022-06-17: 4 [IU] via SUBCUTANEOUS
  Administered 2022-06-17: 2 [IU] via SUBCUTANEOUS
  Administered 2022-06-17 (×2): 12 [IU] via SUBCUTANEOUS
  Administered 2022-06-17: 8 [IU] via SUBCUTANEOUS
  Administered 2022-06-18: 12 [IU] via SUBCUTANEOUS
  Administered 2022-06-18: 8 [IU] via SUBCUTANEOUS

## 2022-06-16 MED ORDER — SUGAMMADEX SODIUM 200 MG/2ML IV SOLN
INTRAVENOUS | Status: DC | PRN
  Administered 2022-06-16: 200 mg via INTRAVENOUS

## 2022-06-16 MED ORDER — DEXAMETHASONE SODIUM PHOSPHATE 10 MG/ML IJ SOLN
INTRAMUSCULAR | Status: DC | PRN
  Administered 2022-06-16: 5 mg via INTRAVENOUS

## 2022-06-16 MED ORDER — ONDANSETRON HCL 4 MG/2ML IJ SOLN
INTRAMUSCULAR | Status: DC | PRN
  Administered 2022-06-16: 4 mg via INTRAVENOUS

## 2022-06-16 MED ORDER — FENTANYL CITRATE (PF) 250 MCG/5ML IJ SOLN
INTRAMUSCULAR | Status: AC
  Filled 2022-06-16: qty 5

## 2022-06-16 MED ORDER — ENOXAPARIN SODIUM 40 MG/0.4ML IJ SOSY
40.0000 mg | PREFILLED_SYRINGE | Freq: Every day | INTRAMUSCULAR | Status: DC
  Administered 2022-06-17 – 2022-06-20 (×4): 40 mg via SUBCUTANEOUS
  Filled 2022-06-16 (×5): qty 0.4

## 2022-06-16 MED ORDER — LIDOCAINE 2% (20 MG/ML) 5 ML SYRINGE
INTRAMUSCULAR | Status: AC
  Filled 2022-06-16: qty 5

## 2022-06-16 MED ORDER — VANCOMYCIN HCL IN DEXTROSE 1-5 GM/200ML-% IV SOLN
INTRAVENOUS | Status: AC
  Filled 2022-06-16: qty 200

## 2022-06-16 MED ORDER — CELECOXIB 200 MG PO CAPS
ORAL_CAPSULE | ORAL | Status: AC
  Administered 2022-06-16: 200 mg via ORAL
  Filled 2022-06-16: qty 1

## 2022-06-16 MED ORDER — PANTOPRAZOLE SODIUM 40 MG PO TBEC
40.0000 mg | DELAYED_RELEASE_TABLET | Freq: Every day | ORAL | Status: DC
  Administered 2022-06-17 – 2022-06-21 (×5): 40 mg via ORAL
  Filled 2022-06-16 (×5): qty 1

## 2022-06-16 MED ORDER — PROPOFOL 10 MG/ML IV BOLUS
INTRAVENOUS | Status: DC | PRN
  Administered 2022-06-16: 150 mg via INTRAVENOUS

## 2022-06-16 MED ORDER — AMLODIPINE BESYLATE 5 MG PO TABS
5.0000 mg | ORAL_TABLET | Freq: Every day | ORAL | Status: DC
  Filled 2022-06-16 (×2): qty 1

## 2022-06-16 MED ORDER — PANTOPRAZOLE SODIUM 40 MG PO TBEC: 40.0000 mg | DELAYED_RELEASE_TABLET | Freq: Every day | ORAL | Status: DC

## 2022-06-16 MED ORDER — OXYBUTYNIN CHLORIDE ER 5 MG PO TB24
5.0000 mg | ORAL_TABLET | Freq: Every day | ORAL | Status: DC
  Administered 2022-06-17 – 2022-06-20 (×4): 5 mg via ORAL
  Filled 2022-06-16 (×5): qty 1

## 2022-06-16 MED ORDER — KETOROLAC TROMETHAMINE 15 MG/ML IJ SOLN
15.0000 mg | Freq: Four times a day (QID) | INTRAMUSCULAR | Status: AC
  Administered 2022-06-16 – 2022-06-18 (×6): 15 mg via INTRAVENOUS
  Filled 2022-06-16 (×6): qty 1

## 2022-06-16 MED ORDER — PHENYLEPHRINE 80 MCG/ML (10ML) SYRINGE FOR IV PUSH (FOR BLOOD PRESSURE SUPPORT)
PREFILLED_SYRINGE | INTRAVENOUS | Status: AC
  Filled 2022-06-16: qty 10

## 2022-06-16 MED ORDER — BISACODYL 5 MG PO TBEC
10.0000 mg | DELAYED_RELEASE_TABLET | Freq: Every day | ORAL | Status: DC
  Administered 2022-06-18 – 2022-06-20 (×3): 10 mg via ORAL
  Filled 2022-06-16 (×4): qty 2

## 2022-06-16 MED ORDER — ATORVASTATIN CALCIUM 40 MG PO TABS
40.0000 mg | ORAL_TABLET | Freq: Every day | ORAL | Status: DC
  Administered 2022-06-16 – 2022-06-21 (×6): 40 mg via ORAL
  Filled 2022-06-16 (×6): qty 1

## 2022-06-16 MED ORDER — FENTANYL CITRATE (PF) 250 MCG/5ML IJ SOLN
INTRAMUSCULAR | Status: DC | PRN
  Administered 2022-06-16 (×2): 50 ug via INTRAVENOUS
  Administered 2022-06-16: 100 ug via INTRAVENOUS

## 2022-06-16 MED ORDER — ACETAMINOPHEN 500 MG PO TABS
ORAL_TABLET | ORAL | Status: AC
  Administered 2022-06-16: 1000 mg via ORAL
  Filled 2022-06-16: qty 2

## 2022-06-16 MED ORDER — SODIUM CHLORIDE 0.9 % IV SOLN: INTRAVENOUS | Status: DC | PRN

## 2022-06-16 MED ORDER — BUPIVACAINE LIPOSOME 1.3 % IJ SUSP
INTRAMUSCULAR | Status: DC | PRN
  Administered 2022-06-16: 100 mL

## 2022-06-16 MED ORDER — ROCURONIUM BROMIDE 10 MG/ML (PF) SYRINGE
PREFILLED_SYRINGE | INTRAVENOUS | Status: AC
  Filled 2022-06-16: qty 10

## 2022-06-16 MED ORDER — KETOROLAC TROMETHAMINE 15 MG/ML IJ SOLN
INTRAMUSCULAR | Status: AC
  Administered 2022-06-16: 15 mg via INTRAVENOUS
  Filled 2022-06-16: qty 1

## 2022-06-16 MED ORDER — FENTANYL CITRATE PF 50 MCG/ML IJ SOSY: 25.0000 ug | PREFILLED_SYRINGE | INTRAMUSCULAR | Status: DC | PRN

## 2022-06-16 MED ORDER — ONDANSETRON HCL 4 MG/2ML IJ SOLN
INTRAMUSCULAR | Status: AC
  Filled 2022-06-16: qty 2

## 2022-06-16 MED ORDER — FENTANYL CITRATE (PF) 100 MCG/2ML IJ SOLN: 25.0000 ug | INTRAMUSCULAR | Status: DC | PRN

## 2022-06-16 MED ORDER — INSULIN ASPART 100 UNIT/ML IJ SOLN
0.0000 [IU] | INTRAMUSCULAR | Status: AC | PRN
  Administered 2022-06-16: 4 [IU] via SUBCUTANEOUS
  Administered 2022-06-16: 2 [IU] via SUBCUTANEOUS
  Filled 2022-06-16: qty 1

## 2022-06-16 MED ORDER — FENTANYL CITRATE (PF) 100 MCG/2ML IJ SOLN
INTRAMUSCULAR | Status: AC
  Administered 2022-06-16: 25 ug via INTRAVENOUS
  Filled 2022-06-16: qty 2

## 2022-06-16 MED ORDER — POLYVINYL ALCOHOL 1.4 % OP SOLN
1.0000 [drp] | Freq: Two times a day (BID) | OPHTHALMIC | Status: DC
  Administered 2022-06-17 – 2022-06-18 (×3): 1 [drp] via OPHTHALMIC
  Administered 2022-06-19 – 2022-06-21 (×4): 2 [drp] via OPHTHALMIC
  Filled 2022-06-16: qty 15

## 2022-06-16 MED ORDER — VANCOMYCIN HCL IN DEXTROSE 1-5 GM/200ML-% IV SOLN
1000.0000 mg | Freq: Two times a day (BID) | INTRAVENOUS | Status: AC
  Administered 2022-06-16: 1000 mg via INTRAVENOUS
  Filled 2022-06-16: qty 200

## 2022-06-16 MED ORDER — BUPIVACAINE HCL (PF) 0.5 % IJ SOLN
INTRAMUSCULAR | Status: AC
  Filled 2022-06-16: qty 30

## 2022-06-16 MED ORDER — MIDAZOLAM HCL 2 MG/2ML IJ SOLN
INTRAMUSCULAR | Status: AC
  Filled 2022-06-16: qty 2

## 2022-06-16 MED ORDER — LIDOCAINE 2% (20 MG/ML) 5 ML SYRINGE
INTRAMUSCULAR | Status: DC | PRN
  Administered 2022-06-16: 60 mg via INTRAVENOUS

## 2022-06-16 MED ORDER — PHENYLEPHRINE HCL-NACL 20-0.9 MG/250ML-% IV SOLN
INTRAVENOUS | Status: DC | PRN
  Administered 2022-06-16: 40 ug/min via INTRAVENOUS

## 2022-06-16 MED ORDER — CIPROFLOXACIN HCL 500 MG PO TABS
500.0000 mg | ORAL_TABLET | Freq: Two times a day (BID) | ORAL | Status: AC
  Administered 2022-06-16 – 2022-06-18 (×4): 500 mg via ORAL
  Filled 2022-06-16 (×4): qty 1

## 2022-06-16 MED ORDER — TRAMADOL HCL 50 MG PO TABS
50.0000 mg | ORAL_TABLET | Freq: Four times a day (QID) | ORAL | Status: DC | PRN
  Administered 2022-06-19 – 2022-06-20 (×2): 100 mg via ORAL
  Filled 2022-06-16 (×2): qty 2

## 2022-06-16 MED ORDER — ACETAMINOPHEN 500 MG PO TABS
1000.0000 mg | ORAL_TABLET | Freq: Four times a day (QID) | ORAL | Status: DC
  Administered 2022-06-16 – 2022-06-21 (×18): 1000 mg via ORAL
  Filled 2022-06-16 (×19): qty 2

## 2022-06-16 MED ORDER — CHLORHEXIDINE GLUCONATE 0.12 % MT SOLN
OROMUCOSAL | Status: AC
  Administered 2022-06-16: 15 mL via OROMUCOSAL
  Filled 2022-06-16: qty 15

## 2022-06-16 MED ORDER — GABAPENTIN 300 MG PO CAPS
300.0000 mg | ORAL_CAPSULE | Freq: Two times a day (BID) | ORAL | Status: DC
  Administered 2022-06-16 – 2022-06-21 (×10): 300 mg via ORAL
  Filled 2022-06-16 (×10): qty 1

## 2022-06-16 MED ORDER — LACTATED RINGERS IV SOLN: INTRAVENOUS | Status: DC | PRN

## 2022-06-16 MED ORDER — DEXAMETHASONE SODIUM PHOSPHATE 10 MG/ML IJ SOLN
INTRAMUSCULAR | Status: AC
  Filled 2022-06-16: qty 1

## 2022-06-16 MED ORDER — LACTATED RINGERS IV SOLN: INTRAVENOUS | Status: DC

## 2022-06-16 MED ORDER — CHLORHEXIDINE GLUCONATE 0.12 % MT SOLN: 15.0000 mL | Freq: Once | OROMUCOSAL | Status: AC

## 2022-06-16 MED ORDER — ONDANSETRON HCL 4 MG/2ML IJ SOLN
4.0000 mg | Freq: Four times a day (QID) | INTRAMUSCULAR | Status: DC | PRN
  Administered 2022-06-16 – 2022-06-19 (×3): 4 mg via INTRAVENOUS
  Filled 2022-06-16 (×3): qty 2

## 2022-06-16 MED ORDER — ONDANSETRON HCL 4 MG/2ML IJ SOLN: 4.0000 mg | Freq: Once | INTRAMUSCULAR | Status: DC | PRN

## 2022-06-16 MED ORDER — BUPIVACAINE LIPOSOME 1.3 % IJ SUSP
INTRAMUSCULAR | Status: AC
  Filled 2022-06-16: qty 20

## 2022-06-16 MED ORDER — ROCURONIUM BROMIDE 10 MG/ML (PF) SYRINGE
PREFILLED_SYRINGE | INTRAVENOUS | Status: DC | PRN
  Administered 2022-06-16: 30 mg via INTRAVENOUS
  Administered 2022-06-16: 70 mg via INTRAVENOUS

## 2022-06-16 MED ORDER — BACLOFEN 10 MG PO TABS
10.0000 mg | ORAL_TABLET | Freq: Two times a day (BID) | ORAL | Status: DC
  Administered 2022-06-16 – 2022-06-21 (×10): 10 mg via ORAL
  Filled 2022-06-16 (×10): qty 1

## 2022-06-16 MED ORDER — PROPOFOL 500 MG/50ML IV EMUL
INTRAVENOUS | Status: DC | PRN
  Administered 2022-06-16: 25 ug/kg/min via INTRAVENOUS

## 2022-06-16 MED ORDER — LORATADINE 10 MG PO TABS
10.0000 mg | ORAL_TABLET | Freq: Every day | ORAL | Status: DC
  Administered 2022-06-17 – 2022-06-21 (×5): 10 mg via ORAL
  Filled 2022-06-16 (×5): qty 1

## 2022-06-16 MED ORDER — ORAL CARE MOUTH RINSE: 15.0000 mL | Freq: Once | OROMUCOSAL | Status: AC

## 2022-06-16 MED ORDER — CELECOXIB 200 MG PO CAPS: 200.0000 mg | ORAL_CAPSULE | Freq: Once | ORAL | Status: AC

## 2022-06-16 MED ORDER — PROPOFOL 1000 MG/100ML IV EMUL
INTRAVENOUS | Status: AC
  Filled 2022-06-16: qty 100

## 2022-06-16 SURGICAL SUPPLY — 104 items
ADH SKN CLS APL DERMABOND .7 (GAUZE/BANDAGES/DRESSINGS) ×1
ADH SKN CLS LQ APL DERMABOND (GAUZE/BANDAGES/DRESSINGS) ×1
APL PRP STRL LF DISP 70% ISPRP (MISCELLANEOUS) ×1
BAG SPEC RTRVL C1550 15 (MISCELLANEOUS) ×1
BLADE CLIPPER SURG (BLADE) ×2 IMPLANT
BLADE SURG 11 STRL SS (BLADE) ×2 IMPLANT
CANISTER SUCT 3000ML PPV (MISCELLANEOUS) ×4 IMPLANT
CANNULA REDUC XI 12-8 STAPL (CANNULA) ×2
CANNULA REDUCER 12-8 DVNC XI (CANNULA) ×4 IMPLANT
CATH THORACIC 28FR (CATHETERS) ×2 IMPLANT
CHLORAPREP W/TINT 26 (MISCELLANEOUS) ×2 IMPLANT
CLIP VESOCCLUDE MED 6/CT (CLIP) IMPLANT
CNTNR URN SCR LID CUP LEK RST (MISCELLANEOUS) ×10 IMPLANT
CONN ST 1/4X3/8  BEN (MISCELLANEOUS)
CONN ST 1/4X3/8 BEN (MISCELLANEOUS) IMPLANT
CONT SPEC 4OZ STRL OR WHT (MISCELLANEOUS) ×10
DEFOGGER SCOPE WARMER CLEARIFY (MISCELLANEOUS) ×2 IMPLANT
DERMABOND ADVANCED .7 DNX12 (GAUZE/BANDAGES/DRESSINGS) ×2 IMPLANT
DERMABOND ADVANCED .7 DNX6 (GAUZE/BANDAGES/DRESSINGS) IMPLANT
DRAIN CHANNEL 28F RND 3/8 FF (WOUND CARE) IMPLANT
DRAPE ARM DVNC X/XI (DISPOSABLE) ×8 IMPLANT
DRAPE COLUMN DVNC XI (DISPOSABLE) ×2 IMPLANT
DRAPE CV SPLIT W-CLR ANES SCRN (DRAPES) ×2 IMPLANT
DRAPE DA VINCI XI ARM (DISPOSABLE) ×4
DRAPE DA VINCI XI COLUMN (DISPOSABLE) ×1
DRAPE ORTHO SPLIT 77X108 STRL (DRAPES) ×1
DRAPE SURG ORHT 6 SPLT 77X108 (DRAPES) ×2 IMPLANT
ELECT BLADE 6.5 EXT (BLADE) IMPLANT
ELECT REM PT RETURN 9FT ADLT (ELECTROSURGICAL) ×1
ELECTRODE REM PT RTRN 9FT ADLT (ELECTROSURGICAL) ×2 IMPLANT
GAUZE KITTNER 4X8 (MISCELLANEOUS) ×2 IMPLANT
GAUZE SPONGE 4X4 12PLY STRL (GAUZE/BANDAGES/DRESSINGS) ×2 IMPLANT
GLOVE BIO SURGEON STRL SZ7.5 (GLOVE) ×4 IMPLANT
GLOVE SURG POLYISO LF SZ8 (GLOVE) ×2 IMPLANT
GOWN STRL REUS W/ TWL LRG LVL3 (GOWN DISPOSABLE) ×4 IMPLANT
GOWN STRL REUS W/ TWL XL LVL3 (GOWN DISPOSABLE) ×4 IMPLANT
GOWN STRL REUS W/TWL 2XL LVL3 (GOWN DISPOSABLE) ×2 IMPLANT
GOWN STRL REUS W/TWL LRG LVL3 (GOWN DISPOSABLE) ×2
GOWN STRL REUS W/TWL XL LVL3 (GOWN DISPOSABLE) ×2
HEMOSTAT SURGICEL 2X14 (HEMOSTASIS) ×6 IMPLANT
IRRIGATION STRYKERFLOW (MISCELLANEOUS) IMPLANT
IRRIGATOR STRYKERFLOW (MISCELLANEOUS)
KIT BASIN OR (CUSTOM PROCEDURE TRAY) ×2 IMPLANT
KIT TURNOVER KIT B (KITS) ×2 IMPLANT
NDL 22X1.5 STRL (OR ONLY) (MISCELLANEOUS) ×2 IMPLANT
NEEDLE 22X1.5 STRL (OR ONLY) (MISCELLANEOUS) ×1 IMPLANT
NS IRRIG 1000ML POUR BTL (IV SOLUTION) ×6 IMPLANT
PACK CHEST (CUSTOM PROCEDURE TRAY) ×2 IMPLANT
PAD ARMBOARD 7.5X6 YLW CONV (MISCELLANEOUS) ×10 IMPLANT
RELOAD STAPLE 45 2.5 WHT DVNC (STAPLE) IMPLANT
RELOAD STAPLE 45 3.5 BLU DVNC (STAPLE) IMPLANT
RELOAD STAPLE 45 4.3 GRN DVNC (STAPLE) IMPLANT
RELOAD STAPLER 2.5X45 WHT DVNC (STAPLE) ×3 IMPLANT
RELOAD STAPLER 3.5X45 BLU DVNC (STAPLE) ×4 IMPLANT
RELOAD STAPLER 4.3X45 GRN DVNC (STAPLE) ×1 IMPLANT
SCISSORS LAP 5X35 DISP (ENDOMECHANICALS) IMPLANT
SEAL CANN UNIV 5-8 DVNC XI (MISCELLANEOUS) ×4 IMPLANT
SEAL XI 5MM-8MM UNIVERSAL (MISCELLANEOUS) ×2
SEALANT PROGEL (MISCELLANEOUS) IMPLANT
SEALER LIGASURE MARYLAND 30 (ELECTROSURGICAL) IMPLANT
SET TRI-LUMEN FLTR TB AIRSEAL (TUBING) ×2 IMPLANT
SOLUTION ELECTROLUBE (MISCELLANEOUS) IMPLANT
SPONGE INTESTINAL PEANUT (DISPOSABLE) IMPLANT
SPONGE TONSIL TAPE 1 RFD (DISPOSABLE) IMPLANT
STAPLER 45 SUREFORM CVD (STAPLE) ×1
STAPLER 45 SUREFORM CVD DVNC (STAPLE) IMPLANT
STAPLER CANNULA SEAL DVNC XI (STAPLE) ×4 IMPLANT
STAPLER CANNULA SEAL XI (STAPLE) ×2
STAPLER RELOAD 2.5X45 WHITE (STAPLE) ×3
STAPLER RELOAD 2.5X45 WHT DVNC (STAPLE) ×3
STAPLER RELOAD 3.5X45 BLU DVNC (STAPLE) ×4
STAPLER RELOAD 3.5X45 BLUE (STAPLE) ×4
STAPLER RELOAD 4.3X45 GREEN (STAPLE) ×1
STAPLER RELOAD 4.3X45 GRN DVNC (STAPLE) ×1
STOPCOCK 4 WAY LG BORE MALE ST (IV SETS) ×2 IMPLANT
SUT MNCRL AB 3-0 PS2 18 (SUTURE) IMPLANT
SUT MON AB 2-0 CT1 36 (SUTURE) IMPLANT
SUT PDS AB 1 CTX 36 (SUTURE) IMPLANT
SUT PROLENE 4 0 RB 1 (SUTURE)
SUT PROLENE 4-0 RB1 .5 CRCL 36 (SUTURE) IMPLANT
SUT SILK  1 MH (SUTURE) ×1
SUT SILK 1 MH (SUTURE) ×2 IMPLANT
SUT SILK 1 TIES 10X30 (SUTURE) IMPLANT
SUT SILK 2 0 SH (SUTURE) IMPLANT
SUT SILK 2 0SH CR/8 30 (SUTURE) IMPLANT
SUT VIC AB 1 CTX 36 (SUTURE)
SUT VIC AB 1 CTX36XBRD ANBCTR (SUTURE) IMPLANT
SUT VIC AB 2-0 CT1 27 (SUTURE) ×1
SUT VIC AB 2-0 CT1 TAPERPNT 27 (SUTURE) ×2 IMPLANT
SUT VIC AB 3-0 SH 27 (SUTURE) ×2
SUT VIC AB 3-0 SH 27X BRD (SUTURE) ×4 IMPLANT
SUT VICRYL 0 TIES 12 18 (SUTURE) ×2 IMPLANT
SUT VICRYL 0 UR6 27IN ABS (SUTURE) ×4 IMPLANT
SUT VICRYL 2 TP 1 (SUTURE) IMPLANT
SYR 10ML LL (SYRINGE) ×2 IMPLANT
SYR 20ML LL LF (SYRINGE) ×2 IMPLANT
SYR 50ML LL SCALE MARK (SYRINGE) ×2 IMPLANT
SYSTEM RETRIEVAL ANCHOR 15 (MISCELLANEOUS) IMPLANT
SYSTEM SAHARA CHEST DRAIN ATS (WOUND CARE) ×2 IMPLANT
TAPE CLOTH 4X10 WHT NS (GAUZE/BANDAGES/DRESSINGS) ×2 IMPLANT
TIP APPLICATOR SPRAY EXTEND 16 (VASCULAR PRODUCTS) IMPLANT
TOWEL GREEN STERILE (TOWEL DISPOSABLE) ×2 IMPLANT
TRAY FOLEY MTR SLVR 16FR STAT (SET/KITS/TRAYS/PACK) ×2 IMPLANT
TUBING EXTENTION W/L.L. (IV SETS) ×2 IMPLANT

## 2022-06-16 NOTE — Anesthesia Procedure Notes (Signed)
Procedure Name: Intubation Date/Time: 06/16/2022 8:56 AM  Performed by: Dorann Lodge, CRNAPre-anesthesia Checklist: Patient identified, Emergency Drugs available, Suction available and Patient being monitored Patient Re-evaluated:Patient Re-evaluated prior to induction Oxygen Delivery Method: Circle System Utilized Preoxygenation: Pre-oxygenation with 100% oxygen Induction Type: IV induction Ventilation: Mask ventilation without difficulty and Oral airway inserted - appropriate to patient size Laryngoscope Size: Mac and 4 Grade View: Grade I Endobronchial tube: Left, Double lumen EBT, EBT position confirmed by auscultation and EBT position confirmed by fiberoptic bronchoscope and 37 Fr Number of attempts: 1 Airway Equipment and Method: Stylet, Oral airway and Fiberoptic brochoscope Placement Confirmation: ETT inserted through vocal cords under direct vision, positive ETCO2 and breath sounds checked- equal and bilateral Secured at: 29 cm Tube secured with: Tape Dental Injury: Teeth and Oropharynx as per pre-operative assessment

## 2022-06-16 NOTE — Op Note (Signed)
      [address]       [place], [address]             [phone]        06/16/2022  Patient:  Megan [name] Pre-Op Dx: NSCLC   Post-op Dx:  same Procedure: - Robotic assisted left  video thoracoscopy - Left lower lobectomy - Mediastinal lymph node sampling - Intercostal nerve block  Surgeon and Role:      * Fillmore Bynum, Lucile Crater, MD - Primary  Assistant: Leretha Pol, PA-C  An experienced assistant was required given the complexity of this surgery and the standard of surgical care. The assistant was needed for exposure, dissection, suctioning, retraction of delicate tissues and sutures, instrument exchange and for overall help during this procedure.    Anesthesia  general EBL:  50 ml Blood Administration: none Specimen:  left lower lobe, hilar and mediastinal nodes  Drains: 28 F argyle chest tube in left chest Counts: correct   Indications: 71yo female with biopsy proven LLL adenocarcinoma.  Nodule measures 3.9cm.  I have ordered an MRI brain given the size of the tumor.  She would like to proceed with surgical resection.  We discussed the risks and benefits of a left robotic assisted thoracoscopy with left lower lobectomy.  A stress test has also been ordered.  Of note she has had spinal surgery in the past along with rib fractures on the left.  She also has a history of nausea with anesthesia.  Findings: Normal anatomy  Operative Technique: After the risks, benefits and alternatives were thoroughly discussed, the patient was brought to the operative theatre.  Anesthesia was induced, and the patient was then placed in a lateral decubitus position and was prepped and draped in normal sterile fashion.  An appropriate surgical pause was performed, and pre-operative antibiotics were dosed accordingly.  We began by placing our 4 robotic ports in the the 7th intercostal space targeting the hilum of the lung.  A 7mm assistant port was placed in the 9th  intercostal space in the anterior axillary line.  The robot was then docked and all instruments were passed under direct visualization.    The lung was then retracted superiorly, and the inferior pulmonary ligament was divided.  The hilum was mobilized anteriorly and posteriorly.  We identified the lower lobe pulmonary vein, and after careful isolation, it was divided with a vascular stapler.  We next moved to the pulmonary artery.  The artery was then divided with a vascular load stapler.  The bronchus to the lower lobe was then isolated.  After a test clamp, with good ventilation of the remaining lung, the bronchus was then divided.  The fissure was completed, and the specimen was passed into an endocatch bag.  It was removed from the anterior access site.    Lymph nodes were then sampled at hilum and mediastinum.  The chest was irrigated, and an air leak test was performed.  An intercostal nerve block was performed under direct visualization.  A 28 F chest tube was then placed, and we watch the remaining lobes re-expand.  The skin and soft tissue were closed with absorbable suture    The patient tolerated the procedure without any immediate complications, and was transferred to the PACU in stable condition.  Megan [name] [name]

## 2022-06-16 NOTE — Brief Op Note (Signed)
06/16/2022  11:08 AM  PATIENT:  Megan [name]  71 y.o. female  PRE-OPERATIVE DIAGNOSIS:  Left lower lobe lung adenocarcinoma  POST-OPERATIVE DIAGNOSIS:  Left lower lobe lung adenocarcinoma  PROCEDURE:  Procedure(s):  XI ROBOTIC ASSISTED THORACOSCOPY  LEFT LOWER LOBECTOMY (Left)  LYMPH NODE DISSECTION (Left)  SURGEON:  Surgeon(s) and Role:    * Lightfoot, Lucile Crater, MD - Primary  PHYSICIAN ASSISTANT: Seairra Otani PA-C  ASSISTANTS: none   ANESTHESIA:   general  EBL:  10 ml  BLOOD ADMINISTERED:none  DRAINS:  28 Straight Chest Tube    LOCAL MEDICATIONS USED:  BUPIVICAINE   SPECIMEN:  Source of Specimen:  Left Lower Lobectomy  DISPOSITION OF SPECIMEN:  PATHOLOGY  COUNTS:  YES  TOURNIQUET:  * No tourniquets in log *  DICTATION: .Dragon Dictation  PLAN OF CARE: Admit to inpatient   PATIENT DISPOSITION:  PACU - hemodynamically stable.   Delay start of Pharmacological VTE agent (>24hrs) due to surgical blood loss or risk of bleeding: no

## 2022-06-16 NOTE — Interval H&P Note (Signed)
History and Physical Interval Note:  06/16/2022 8:33 AM  Megan [name]  has presented today for surgery, with the diagnosis of LUNG CANCER.  The various methods of treatment have been discussed with the patient and family. After consideration of risks, benefits and other options for treatment, the patient has consented to  Procedure(s): XI ROBOTIC ASSISTED THORACOSCOPY-LEFT LOWER LOBECTOMY (Left) as a surgical intervention.  The patient's history has been reviewed, patient examined, no change in status, stable for surgery.  I have reviewed the patient's chart and labs.  Questions were answered to the patient's satisfaction.     Doyce Saling [name]

## 2022-06-16 NOTE — Anesthesia Postprocedure Evaluation (Signed)
Anesthesia Post Note  Patient: Megan [name]  Procedure(s) Performed: XI ROBOTIC ASSISTED THORACOSCOPY-LEFT LOWER LOBECTOMY (Left: Chest) LYMPH NODE DISSECTION (Left: Chest) INTERCOSTAL NERVE BLOCK (Left: Chest)     Patient location during evaluation: PACU Anesthesia Type: General Level of consciousness: awake and alert Pain management: pain level controlled Vital Signs Assessment: post-procedure vital signs reviewed and stable Respiratory status: spontaneous breathing, nonlabored ventilation and respiratory function stable Cardiovascular status: stable and blood pressure returned to baseline Anesthetic complications: no   No notable events documented.  Last Vitals:  Vitals:   06/16/22 1215 06/16/22 1239  BP: 121/64 138/65  Pulse: 71 73  Resp: 11 14  Temp: (!) 36.1 C (!) 36.1 C  SpO2: 97% 100%    Last Pain:  Vitals:   06/16/22 1239  TempSrc: Axillary  PainSc: Early

## 2022-06-16 NOTE — Hospital Course (Addendum)
History of Present Illness:  Megan [name] 71 y.o. female presents for surgical evaluation of a 3.9 cm left lower lobe biopsy-proven non-small cell lung cancer.  She has a history of significant smoking and was originally screened in our lung cancer program.  She subsequently underwent a biopsy with Dr. Valeta Harms.  She denies any neurologic symptoms outside of those related to her previous spinal surgeries.  Her weight has been stable.  She is able to ambulate short distances with a walker. She has been evaluated by Dr. Kipp Brood who recommended surgical resection. The risks and benefits of the procedure were explained to the patient and she was agreeable to proceed.  Hospital Course:  Megan [name] presented to [facility] on 06/16/2022.  She was taken to the operating room and underwent Robotic Assisted Left Video Assisted Thoracoscopy with Left Lower Lobectomy, Lymph Node Dissection, and Intercostal Nerve Block.  She tolerated the procedure without difficulty, was extubated, and taken to the PACU in stable condition.  Postoperative hospital course:  Patient has overall done well.  She does on postoperative day #1 having a small air leak and chest tube was kept in place.  Additionally, she has a small left apical pneumothorax.  She had a slight bump in her creatinine postop day 1 and this is being followed over time.  She is maintaining good urine output.  She has a mild expected acute blood loss anemia.  She is tolerating routine progression and doing incentive spirometry and pulmonary hygiene.  A small air leak persisted for several days.  She also had persistent nausea and dizziness that seem to be worse with transitioning out of bed.  Her nausea and dizziness had resolved for the most part.  Oral intake improved.  By postop day 4, the air leak normal.  Dr. Kipp Brood felt the chest tube could be safely removed on this date.  Follow-up chest x-ray on 06/21/2022 showed***

## 2022-06-16 NOTE — Addendum Note (Signed)
Addendum  created 06/16/22 1430 by Dorann Lodge, CRNA   Charge Capture section accepted, Visit diagnoses modified

## 2022-06-16 NOTE — Transfer of Care (Signed)
Immediate Anesthesia Transfer of Care Note  Patient: Megan [name]  Procedure(s) Performed: XI ROBOTIC ASSISTED THORACOSCOPY-LEFT LOWER LOBECTOMY (Left: Chest) LYMPH NODE DISSECTION (Left: Chest) INTERCOSTAL NERVE BLOCK (Left: Chest)  Patient Location: PACU  Anesthesia Type:General  Level of Consciousness: drowsy  Airway & Oxygen Therapy: Patient Spontanous Breathing and Patient connected to face mask oxygen  Post-op Assessment: Report given to RN and Post -op Vital signs reviewed and stable  Post vital signs: Reviewed and stable  Last Vitals:  Vitals Value Taken Time  BP 167/89 06/16/22 1134  Temp    Pulse 78 06/16/22 1136  Resp 12 06/16/22 1136  SpO2 100 % 06/16/22 1136  Vitals shown include unvalidated device data.  Last Pain:  Vitals:   06/16/22 0659  TempSrc: Oral  PainSc:       Patients Stated Pain Goal: 0 (17/40/81 4481)  Complications: No notable events documented.

## 2022-06-17 ENCOUNTER — Inpatient Hospital Stay (HOSPITAL_COMMUNITY): Payer: PPO

## 2022-06-17 ENCOUNTER — Telehealth: Payer: Self-pay

## 2022-06-17 ENCOUNTER — Encounter (HOSPITAL_COMMUNITY): Payer: Self-pay | Admitting: Thoracic Surgery (Cardiothoracic Vascular Surgery)

## 2022-06-17 LAB — BASIC METABOLIC PANEL
Anion gap: 9 (ref 5–15)
BUN: 15 mg/dL (ref 8–23)
CO2: 23 mmol/L (ref 22–32)
Calcium: 8.8 mg/dL — ABNORMAL LOW (ref 8.9–10.3)
Chloride: 100 mmol/L (ref 98–111)
Creatinine, Ser: 1.1 mg/dL — ABNORMAL HIGH (ref 0.44–1.00)
GFR, Estimated: 54 mL/min — ABNORMAL LOW (ref >60)
Glucose, Bld: 231 mg/dL — ABNORMAL HIGH (ref 70–99)
Potassium: 4.4 mmol/L (ref 3.5–5.1)
Sodium: 132 mmol/L — ABNORMAL LOW (ref 135–145)

## 2022-06-17 LAB — GLUCOSE, CAPILLARY
Glucose-Capillary: 164 mg/dL — ABNORMAL HIGH (ref 70–99)
Glucose-Capillary: 128 mg/dL — ABNORMAL HIGH (ref 70–99)
Glucose-Capillary: 177 mg/dL — ABNORMAL HIGH (ref 70–99)
Glucose-Capillary: 286 mg/dL — ABNORMAL HIGH (ref 70–99)
Glucose-Capillary: 247 mg/dL — ABNORMAL HIGH (ref 70–99)

## 2022-06-17 LAB — CBC
HCT: 34.3 % — ABNORMAL LOW (ref 36.0–46.0)
Hemoglobin: 12.1 g/dL (ref 12.0–15.0)
MCH: 30 pg (ref 26.0–34.0)
MCHC: 35.3 g/dL (ref 30.0–36.0)
MCV: 85.1 fL (ref 80.0–100.0)
Platelets: 195 10*3/uL (ref 150–400)
RBC: 4.03 MIL/uL (ref 3.87–5.11)
RDW: 13.3 % (ref 11.5–15.5)
WBC: 9.4 10*3/uL (ref 4.0–10.5)
nRBC: 0 % (ref 0.0–0.2)

## 2022-06-17 LAB — ACID FAST CULTURE WITH REFLEXED SENSITIVITIES (MYCOBACTERIA): Acid Fast Culture: NEGATIVE

## 2022-06-17 NOTE — Progress Notes (Signed)
Patient assisted with nursing staff.  To get out of bed. Patient with walker and chest tube. Patient became nauseated when sitting up on side of bed. Patient was able to transfer to the chair. Gait slightly unsteady. Zofran given as ordered as needed for nausea. Call bell within reach. Christain Mcraney, Bettina Gavia RN

## 2022-06-17 NOTE — Progress Notes (Addendum)
[address] ValleySuite [address]       [place] [address]             [phone]      1 Day Post-Op Procedure(s) (LRB): XI ROBOTIC ASSISTED THORACOSCOPY-LEFT LOWER LOBECTOMY (Left) LYMPH NODE DISSECTION (Left) INTERCOSTAL NERVE BLOCK (Left) Subjective: Generally feels well  Objective: Vital signs in last 24 hours: Temp:  [97 F (36.1 C)-98.9 F (37.2 C)] 97.4 F (36.3 C) (10/05 0739) Pulse Rate:  [67-80] 67 (10/05 0739) Cardiac Rhythm: Normal sinus rhythm (10/04 1902) Resp:  [10-18] 15 (10/05 0739) BP: (121-167)/(58-89) 123/64 (10/05 0739) SpO2:  [95 %-100 %] 97 % (10/05 0739)  Hemodynamic parameters for last 24 hours:    Intake/Output from previous day: 10/04 0701 - 10/05 0700 In: 1100 [I.V.:900; IV Piggyback:200] Out: 1074 [Urine:875; Blood:20; Chest Tube:179] Intake/Output this shift: No intake/output data recorded.  General appearance: alert, cooperative, and no distress Heart: regular rate and rhythm Lungs: Crackles and rales at left base Abdomen: Benign Extremities: No edema or calf tenderness Wound: Incisions healing well  Lab Results: Recent Labs    06/17/22 0117  WBC 9.4  HGB 12.1  HCT 34.3*  PLT 195   BMET:  Recent Labs    06/14/22 1600 06/17/22 0117  NA 142 132*  K 4.1 4.4  CL 109 100  CO2 23 23  GLUCOSE 149* 231*  BUN 13 15  CREATININE 1.00 1.10*  CALCIUM 9.2 8.8*    PT/INR:  Recent Labs    06/14/22 1600  LABPROT 13.4  INR 1.0   ABG    Component Value Date/Time   HCO3 24.9 (H) 09/07/2007 1735   TCO2 26 09/07/2007 1735   CBG (last 3)  Recent Labs    06/16/22 2337 06/17/22 0350 06/17/22 0741  GLUCAP 270* 164* 128*    Meds Scheduled Meds:  acetaminophen  1,000 mg Oral Q6H   Or   acetaminophen (TYLENOL) oral liquid 160 mg/5 mL  1,000 mg Oral Q6H   amLODipine  5 mg Oral Daily   atorvastatin  40 mg Oral Daily   baclofen  10 mg Oral BID   bisacodyl  10 mg Oral Daily   ciprofloxacin  500 mg Oral BID   enoxaparin  (LOVENOX) injection  40 mg Subcutaneous Daily   gabapentin  300 mg Oral BID   insulin aspart  0-24 Units Subcutaneous Q4H   ketorolac  15 mg Intravenous Q6H   loratadine  10 mg Oral Daily   oxybutynin  5 mg Oral QHS   pantoprazole  40 mg Oral Daily   polyvinyl alcohol  1-2 drop Both Eyes BID   senna-docusate  1 tablet Oral QHS   Continuous Infusions:  sodium chloride 10 mL/hr at 06/16/22 2138   PRN Meds:.sodium chloride, fentaNYL (SUBLIMAZE) injection, ondansetron (ZOFRAN) IV, traMADol  Xrays DG Chest Port 1 View  Result Date: 06/17/2022 CLINICAL DATA:  Status post partial resection of left lung EXAM: PORTABLE CHEST 1 VIEW COMPARISON:  Previous studies including the examination of 06/16/2022 FINDINGS: Cardiac size is within normal limits. There is new infiltrate in the medial left lower lung field. Small left apical pneumothorax is seen with possible minimal increase. There is faint lucency in the periphery of left lower lung field, possibly minimal loculated pneumothorax. There is subcutaneous emphysema in left chest wall. Single left chest tube is noted with its tip in the medial left apex. Right lung is clear. IMPRESSION: Small left apical pneumothorax with minimal increase. There  new infiltrate in the medial left lower lung fields suggesting atelectasis. Electronically Signed   By: Elmer Picker M.D.   On: 06/17/2022 08:01   DG Chest Port 1 View  Result Date: 06/16/2022 CLINICAL DATA:  Status post left lobectomy. EXAM: PORTABLE CHEST 1 VIEW COMPARISON:  June 14, 2022. FINDINGS: The heart size and mediastinal contours are within normal limits. Left-sided chest tube is noted with tip in the left lung apex. Small left apical pneumothorax is noted. Right lung is unremarkable. The visualized skeletal structures are unremarkable. IMPRESSION: Left-sided chest tube is noted with small left apical pneumothorax. Electronically Signed   By: Marijo Conception M.D.   On: 06/16/2022 13:11     Assessment/Plan: S/P Procedure(s) (LRB): XI ROBOTIC ASSISTED THORACOSCOPY-LEFT LOWER LOBECTOMY (Left) LYMPH NODE DISSECTION (Left) INTERCOSTAL NERVE BLOCK (Left) Postop day #1  1 afebrile, vital signs stable, systolic blood pressure [id number], possibly well controlled, will hold on resuming lisinopril for now but should be able to at time of discharge.Sinus rhythm 2 oxygen saturations are good on room air 3 chest tube drainage 179 cc / 24 hours, continue chest tube to waterseal 4 good urine output 5 creatinine slightly increased to 1.10 we will monitor, may need to DC Toradol 6 minor hyponatremia, sodium 132, continue to monitor 7 minor expected acute blood loss anemia, hematocrit 34.3-monitor clinically 8 small left apical pneumothorax with minimal increase.  Atelectasis in the left lower lung fields 9 BS adequate control-we will hold on resuming Glucophage for now. 10 routine pulmonary hygiene and rehab    LOS: 1 day    John Giovanni PA-C Pager [phone] 06/17/2022    Agree with above No obvious leak IS, ambulation Dispo planning  [facility]

## 2022-06-17 NOTE — Telephone Encounter (Signed)
        Patient  visited [place] on 9/30   Telephone encounter attempt :  1st  A HIPAA compliant voice message was left requesting a return call.  Instructed patient to call back   [place], [employer]  [phone] [address], [place], [place] [address] Phone: [phone] Email: [email or web address]

## 2022-06-17 NOTE — Progress Notes (Signed)
After patient bathed, When standing to get back into bed from chair patient had another episode of nausea and patient stated she was hot and sweaty. Patient felt better once she was lying down. Patient family at bedside. Call bell within reach. Novelle Addair, Bettina Gavia rN

## 2022-06-17 NOTE — Discharge Summary (Signed)
Physician Discharge Summary  Patient ID: RAYVEN RETTIG MRN: [id number] DOB/AGE: 1950/11/08 71 y.o.  Admit date: 06/16/2022 Discharge date: 06/21/2022  Admission Diagnoses:  Lung nodule Type 2 diabetes mellitus Hypertension Hyperlipidemia History of Neurogenic bowel  Discharge Diagnoses:   Lung nodule Type 2 diabetes mellitus Hypertension Hyperlipidemia History of Neurogenic bowel S/P lobectomy of lung    Discharged Condition: stable  History of Present Illness:  JANELI LEWISON 71 y.o. female presents for surgical evaluation of a 3.9 cm left lower lobe biopsy-proven non-small cell lung cancer.  She has a history of significant smoking and was originally screened in our lung cancer program.  She subsequently underwent a biopsy with Dr. Valeta Harms.  She denies any neurologic symptoms outside of those related to her previous spinal surgeries.  Her weight has been stable.  She is able to ambulate short distances with a walker. She has been evaluated by Dr. Kipp Brood who recommended surgical resection. The risks and benefits of the procedure were explained to the patient and she was agreeable to proceed.  Hospital Course:  Sherae Santino presented to [facility] on 06/16/2022.  She was taken to the operating room and underwent Robotic Assisted Left Video Assisted Thoracoscopy with Left Lower Lobectomy, Lymph Node Dissection, and Intercostal Nerve Block.  She tolerated the procedure without difficulty, was extubated, and taken to the PACU in stable condition.  Postoperative hospital course:  Patient has overall done well.  She does on postoperative day #1 having a small air leak and chest tube was kept in place.  Additionally, she has a small left apical pneumothorax.  She had a slight bump in her creatinine postop day 1 and this is being followed over time.  She is maintaining good urine output.  She has a mild expected acute blood loss anemia.  She is tolerating routine  progression and doing incentive spirometry and pulmonary hygiene.  A small air leak persisted for several days.  She also had persistent nausea and dizziness that seem to be worse with transitioning out of bed.  Her nausea and dizziness had resolved for the most part.  Oral intake improved.  By postop day 4, the air leak normal.  Dr. Kipp Brood felt the chest tube could be safely removed on this date.  Follow-up chest x-ray on 06/21/2022 showed small left apical pneumothorax unchanged from prior to chest tube removal. Respiratory status remained stable with SaO2 96% on RA.  Shw was ambulating without difficulty and tolerating the carb-modified diet with no trouble. The incisions were intact and dry.   Consults: None  Significant Diagnostic Studies:  DG Chest 2 View  Result Date: 06/21/2022 CLINICAL DATA:  Status post left lobectomy.  Surgery follow-up. EXAM: CHEST - 2 VIEW COMPARISON:  AP chest 06/20/2022, 06/19/2022, 06/18/2022, 06/17/2022 FINDINGS: Small left apical pneumothorax appears not significantly changed from most recent 06/20/2022 at 6:55 a.m. radiograph. There is again left lower lung heterogeneous airspace opacification, likely atelectasis. Inferolateral left chest wall subcutaneous air is similar to prior, in the region of the prior left chest tube approach. Right lung remains clear. No pleural effusion or pneumothorax. Moderate multilevel degenerative disc changes of the thoracic spine. Partial visualization of thoracolumbar posterior fusion hardware. Lower cervical spine ACDF hardware. IMPRESSION: 1. Interval removal of left-sided chest tube. Small left apical pneumothorax appears not significantly changed from most recent radiographs 06/20/2022 at 6:55 AM. 2. Unchanged left lower lung heterogeneous airspace opacification, likely atelectasis. Electronically Signed   By: Viann Fish.D.  On: 06/21/2022 06:40   DG CHEST PORT 1 VIEW  Result Date: 06/20/2022 CLINICAL DATA:  Status post  left-sided lobectomy EXAM: PORTABLE CHEST 1 VIEW COMPARISON:  Prior chest x-ray yesterday 06/19/2022 FINDINGS: Left-sided chest tube in unchanged position. Persistent small left apical and left basilar pneumothorax with associated left basilar atelectasis. Right to left shift of the cardiac and mediastinal structures consistent with volume loss related to recent lobectomy. The right lung remains clear and well aerated. Partially imaged surgical changes of prior cervical and thoracolumbar fusions. IMPRESSION: Stable chest x-ray without evidence of interval change. Persistent small left apical and basilar pneumothorax with chest tube in place. Electronically Signed   By: Jacqulynn Cadet M.D.   On: 06/20/2022 08:10   DG CHEST PORT 1 VIEW  Result Date: 06/19/2022 CLINICAL DATA:  Follow-up post lobectomy of the left lung. EXAM: PORTABLE CHEST 1 VIEW COMPARISON:  Multiple chest x-rays, most recently June 18, 2022 FINDINGS: Stable cardiomediastinal contours. Stable positioning of left chest tube with improved aeration of the left lung base. Similar size of a moderate-sized left pneumothorax. Unchanged left chest wall subcutaneous emphysema, presumably postoperative. No new focal pulmonary opacity. No right pleural effusion or pneumothorax. Partially visualized thoracic fusion hardware. No acute osseous abnormality. IMPRESSION: 1. Improved aeration of the left lung base. 2. Unchanged moderate left pneumothorax with left chest tube in place. Electronically Signed   By: Beryle Flock M.D.   On: 06/19/2022 09:54   DG CHEST PORT 1 VIEW  Result Date: 06/18/2022 CLINICAL DATA:  [id number].  Follow-up post lobectomy of lung. EXAM: PORTABLE CHEST 1 VIEW COMPARISON:  Portable chest yesterday at 5:29 a.m. FINDINGS: 6:45 a.m. Chest tube entering the lower lateral left thorax again courses medially than cephalad with tip in the medial left apex. A minimal left apical pneumothorax appear similar but there is increased  subpulmonic left pneumothorax today, in aggregate estimated at least 30% chest volume but without mediastinal shift. There is increased subcutaneous emphysema in the lower lateral left chest wall also noted. There is increased opacity in the partially collapsed left base consistent with atelectasis or infiltrate. The remaining lungs clear with stable mediastinal configuration. The cardiac size is normal. No significant pleural fluid is suspected. There is fusion hardware in the cervical and lower thoracic/lumbar spine. IMPRESSION: 1. Increased subpulmonic left pneumothorax, stable minimal apical pneumothorax, today in aggregate estimated at least 30% chest volume with increased subcutaneous emphysema lower lateral chest wall. 2. No mediastinal shift is seen. 3. Increased opacity in the partially collapsed left base. No other focal infiltrates. 4. These results will be called to the ordering clinician or representative by the radiologist assistant, and communication documented in the PACS or [facility] [place] dashboard. Electronically Signed   By: Telford Nab M.D.   On: 06/18/2022 07:14   DG Chest Port 1 View  Result Date: 06/17/2022 CLINICAL DATA:  Status post partial resection of left lung EXAM: PORTABLE CHEST 1 VIEW COMPARISON:  Previous studies including the examination of 06/16/2022 FINDINGS: Cardiac size is within normal limits. There is new infiltrate in the medial left lower lung field. Small left apical pneumothorax is seen with possible minimal increase. There is faint lucency in the periphery of left lower lung field, possibly minimal loculated pneumothorax. There is subcutaneous emphysema in left chest wall. Single left chest tube is noted with its tip in the medial left apex. Right lung is clear. IMPRESSION: Small left apical pneumothorax with minimal increase. There new infiltrate in the medial left lower lung fields  suggesting atelectasis. Electronically Signed   By: Elmer Picker M.D.   On:  06/17/2022 08:01   DG Chest Port 1 View  Result Date: 06/16/2022 CLINICAL DATA:  Status post left lobectomy. EXAM: PORTABLE CHEST 1 VIEW COMPARISON:  June 14, 2022. FINDINGS: The heart size and mediastinal contours are within normal limits. Left-sided chest tube is noted with tip in the left lung apex. Small left apical pneumothorax is noted. Right lung is unremarkable. The visualized skeletal structures are unremarkable. IMPRESSION: Left-sided chest tube is noted with small left apical pneumothorax. Electronically Signed   By: Marijo Conception M.D.   On: 06/16/2022 13:11   DG Chest 2 View  Result Date: 06/16/2022 CLINICAL DATA:  Preop chest exam. Surgery to remove nodule from LEFT lung scheduled on Wednesday. EXAM: CHEST - 2 VIEW COMPARISON:  Chest x-ray dated 06/02/2022. FINDINGS: Heart size and mediastinal contours are within normal limits. No evidence of pneumonia or pulmonary edema. No pleural effusion or pneumothorax is seen. LEFT infrahilar lung nodule better demonstrated on earlier chest CT of 03/22/2022. Degenerative spondylosis of the mid/lower thoracic spine, moderate in degree, with fixation hardware appearing appropriately positioned within the lower thoracic spine (incompletely imaged). IMPRESSION: 1. No active cardiopulmonary disease. No evidence of pneumonia or pulmonary edema. 2. LEFT infrahilar lung nodule better demonstrated on earlier chest CT of 03/22/2022. Electronically Signed   By: Franki Cabot M.D.   On: 06/16/2022 10:06   CT Head Wo Contrast  Result Date: 06/12/2022 CLINICAL DATA:  Headache and mild blurry vision EXAM: CT HEAD WITHOUT CONTRAST TECHNIQUE: Contiguous axial images were obtained from the base of the skull through the vertex without intravenous contrast. RADIATION DOSE REDUCTION: This exam was performed according to the departmental dose-optimization program which includes automated exposure control, adjustment of the mA and/or kV according to patient size and/or  use of iterative reconstruction technique. COMPARISON:  MRI head 05/22/2022 FINDINGS: Brain: No intracranial hemorrhage, mass effect, or evidence of acute infarct. No hydrocephalus. No extra-axial fluid collection. Mild cerebral atrophy and chronic small vessel ischemic disease. Vascular: No hyperdense vessel. Intracranial arterial calcification. Skull: No fracture or focal lesion. Sinuses/Orbits: No acute finding. Paranasal sinuses and mastoid air cells are well aerated. Other: None. IMPRESSION: No acute intracranial abnormality. Electronically Signed   By: Placido Sou M.D.   On: 06/12/2022 19:29   DG Chest 2 View  Result Date: 06/03/2022 CLINICAL DATA:  [id number]. Pre-admit for lung surgery,hx lung nodule,htn,dm,no chest complaints EXAM: CHEST - 2 VIEW COMPARISON:  Chest x-ray 05/04/2022, PET CT 04/09/2022 FINDINGS: The heart and mediastinal contours are within normal limits. No focal consolidation. No pulmonary edema. No pleural effusion. No pneumothorax. No acute osseous abnormality. Partially visualized thoracolumbar surgical hardware. Moderate severe degenerative changes of midthoracic spine superior to the surgical hardware. IMPRESSION: No active cardiopulmonary disease. Electronically Signed   By: Iven Finn M.D.   On: 06/03/2022 21:33   Myocardial Perfusion Imaging  Result Date: 05/25/2022   The study is normal. The study is low risk.   No ST deviation was noted.   LV perfusion is normal.   Left ventricular function is normal. End diastolic cavity size is normal.   Prior study not available for comparison.   MR Brain W Wo Contrast  Result Date: 05/22/2022 CLINICAL DATA:  Lung nodule. Metastatic disease evaluation. Right-sided numbness and weakness with difficulty walking. EXAM: MRI HEAD WITHOUT AND WITH CONTRAST TECHNIQUE: Multiplanar, multiecho pulse sequences of the brain and surrounding structures were obtained without and with  intravenous contrast. CONTRAST:  85mL MULTIHANCE GADOBENATE  DIMEGLUMINE 529 MG/ML IV SOLN COMPARISON:  None Available. FINDINGS: Brain: There is no evidence of an acute infarct, intracranial hemorrhage, mass, midline shift, or extra-axial fluid collection. Mild cerebral atrophy is within normal limits for age. Small T2 hyperintensities in the cerebral white matter bilaterally are nonspecific but compatible with mild chronic small vessel ischemic disease. Two punctate foci of enhancement in the left occipital lobe on the axial T1 SPACE black blood sequence (series 13, images 39 and 51) are attributed to vascular enhancement when correlating with the MPRAGE sequence. No definite abnormal intracranial enhancement suggestive of metastatic disease is identified. Vascular: Major intracranial vascular flow voids are preserved. Skull and upper cervical spine: Unremarkable bone marrow signal. Sinuses/Orbits: Unremarkable orbits. Mucosal thickening in the right sphenoid sinus. Clear mastoid air cells. Other: None. IMPRESSION: 1. No evidence of intracranial metastases. 2. Mild chronic small vessel ischemic disease. Electronically Signed   By: Logan Bores M.D.   On: 05/22/2022 11:37      Results for orders placed or performed during the hospital encounter of 06/16/22 (from the past 48 hour(s))  Glucose, capillary     Status: Abnormal   Collection Time: 06/19/22  1:39 PM  Result Value Ref Range   Glucose-Capillary 256 (H) 70 - 99 mg/dL    Comment: Glucose reference range applies only to samples taken after fasting for at least 8 hours.   Comment 1 Notify RN    Comment 2 Document in Chart   Glucose, capillary     Status: Abnormal   Collection Time: 06/19/22  4:46 PM  Result Value Ref Range   Glucose-Capillary 269 (H) 70 - 99 mg/dL    Comment: Glucose reference range applies only to samples taken after fasting for at least 8 hours.  Glucose, capillary     Status: Abnormal   Collection Time: 06/19/22  9:37 PM  Result Value Ref Range   Glucose-Capillary 168 (H) 70 - 99  mg/dL    Comment: Glucose reference range applies only to samples taken after fasting for at least 8 hours.   Comment 1 Notify RN    Comment 2 Document in Chart   Glucose, capillary     Status: Abnormal   Collection Time: 06/20/22  6:28 AM  Result Value Ref Range   Glucose-Capillary 189 (H) 70 - 99 mg/dL    Comment: Glucose reference range applies only to samples taken after fasting for at least 8 hours.  Glucose, capillary     Status: Abnormal   Collection Time: 06/20/22 11:24 AM  Result Value Ref Range   Glucose-Capillary 175 (H) 70 - 99 mg/dL    Comment: Glucose reference range applies only to samples taken after fasting for at least 8 hours.  Glucose, capillary     Status: Abnormal   Collection Time: 06/20/22  4:08 PM  Result Value Ref Range   Glucose-Capillary 189 (H) 70 - 99 mg/dL    Comment: Glucose reference range applies only to samples taken after fasting for at least 8 hours.  Glucose, capillary     Status: Abnormal   Collection Time: 06/20/22  9:42 PM  Result Value Ref Range   Glucose-Capillary 159 (H) 70 - 99 mg/dL    Comment: Glucose reference range applies only to samples taken after fasting for at least 8 hours.  Glucose, capillary     Status: Abnormal   Collection Time: 06/21/22  6:19 AM  Result Value Ref Range   Glucose-Capillary 154 (  H) 70 - 99 mg/dL    Comment: Glucose reference range applies only to samples taken after fasting for at least 8 hours.    Treatments: surgery:   06/16/2022   Patient:  [name] Pre-Op Dx: NSCLC   Post-op Dx:  same Procedure: - Robotic assisted left  video thoracoscopy - Left lower lobectomy - Mediastinal lymph node sampling - Intercostal nerve block   Surgeon and Role:      * Lightfoot, Lucile Crater, MD - Primary   Assistant: Leretha Pol, PA-C     PATHOLOGY: Pending at time of discharge  Discharge Exam: Blood pressure (!) 147/71, pulse 83, temperature 97.8 F (36.6 C), temperature source Oral, resp. rate 16,  height 5\' 8"  (1.727 m), weight 78 kg, SpO2 97 %.  General appearance: alert, cooperative, and no distress Heart: regular rate and rhythm Lungs: clear to auscultation bilaterally Abdomen: benign Extremities: no edema  Wound: incis healing well  Disposition: Discharged to home in good condition   Allergies as of 06/21/2022       Reactions   Shellfish Allergy Anaphylaxis, Swelling, Other (See Comments)   Tongue swells   Oxycodone Nausea Only   Penicillins Rash, Other (See Comments)   Has patient had a PCN reaction causing immediate rash, facial/tongue/throat swelling, SOB or lightheadedness with hypotension: Yes Has patient had a PCN reaction causing severe rash involving mucus membranes or skin necrosis: No Has patient had a PCN reaction that required hospitalization No Has patient had a PCN reaction occurring within the last 10 years: No If all of the above answers are "NO", then may proceed with Cephalosporin use.        Medication List     STOP taking these medications    celecoxib 200 MG capsule Commonly known as: CELEBREX       TAKE these medications    amLODipine 5 MG tablet Commonly known as: NORVASC Take 1 tablet (5 mg total) by mouth daily.   atorvastatin 40 MG tablet Commonly known as: LIPITOR Take 1 tablet (40 mg total) by mouth daily. For cholesterol.   baclofen 10 MG tablet Commonly known as: LIORESAL Take 1 tablet (10 mg total) by mouth 2 (two) times daily. What changed: See the new instructions.   Biotin 5000 MCG Caps Take 5,000 mcg by mouth every morning.   cetirizine 10 MG tablet Commonly known as: ZYRTEC Take 1 tablet (10 mg total) by mouth daily. For allergies   ciprofloxacin 500 MG tablet Commonly known as: Cipro Take 1 tablet (500 mg total) by mouth 2 (two) times daily.   CoQ10 100 MG Caps Take 100 mg by mouth every evening.   esomeprazole 20 MG capsule Commonly known as: NexIUM 24HR Take 1 capsule (20 mg total) by mouth daily at  12 noon.   fluocinonide cream 0.05 % Commonly known as: LIDEX Apply 1 Application topically 2 (two) times daily as needed (irritation).   gabapentin 300 MG capsule Commonly known as: NEURONTIN Take 1 capsule (300 mg total) by mouth 2 (two) times daily. For pain.   ibuprofen 200 MG tablet Commonly known as: ADVIL Take 600 mg by mouth every 8 (eight) hours as needed (pain).   lisinopril 20 MG tablet Commonly known as: ZESTRIL TAKE 1 TABLET(20 MG) BY MOUTH DAILY FOR BLOOD PRESSURE   Lubricant Eye Drops 0.4-0.3 % Soln Generic drug: Polyethyl Glycol-Propyl Glycol Place 1-2 drops into both eyes 2 (two) times daily.   metFORMIN 500 MG tablet Commonly known as: GLUCOPHAGE TAKE  1 TABLET BY MOUTH TWICE DAILY FOR DIABETES   nystatin-triamcinolone ointment Commonly known as: MYCOLOG Apply 1 Application topically 2 (two) times daily as needed (irritation).   oxybutynin 3.9 MG/24HR Commonly known as: OXYTROL Place 1 patch onto the skin See admin instructions. Change every 5 days   oxybutynin 5 MG 24 hr tablet Commonly known as: DITROPAN-XL TAKE 1 TABLET(5 MG) BY MOUTH AT BEDTIME FOR OVERACTIVE BLADDER   senna-docusate 8.6-50 MG tablet Commonly known as: Senokot-S Take 2 tablets by mouth 2 (two) times daily. What changed:  how much to take when to take this   traMADol 50 MG tablet Commonly known as: ULTRAM Take 1 tablet (50 mg total) by mouth every 6 (six) hours as needed for up to 7 days (mild pain).   Vitamin D 50 MCG (2000 UT) tablet Take 2,000 Units by mouth daily.        Follow-up Information     [name], MD. Go on 07/01/2022.   Specialty: Cardiothoracic Surgery Why: Your appointment is at 12:10pm.  Please arrive 30 minutes early for a chest x-ray to be peformed by [facility] located on the first floor of the same building. Contact information: [place] [address] [phone]                  Signed: Antony Odea, PA-C  06/21/2022, 7:56 AM

## 2022-06-18 ENCOUNTER — Inpatient Hospital Stay (HOSPITAL_COMMUNITY): Payer: PPO

## 2022-06-18 LAB — CBC
HCT: 38.4 % (ref 36.0–46.0)
Hemoglobin: 12.7 g/dL (ref 12.0–15.0)
MCH: 29.3 pg (ref 26.0–34.0)
MCHC: 33.1 g/dL (ref 30.0–36.0)
MCV: 88.5 fL (ref 80.0–100.0)
Platelets: 186 10*3/uL (ref 150–400)
RBC: 4.34 MIL/uL (ref 3.87–5.11)
RDW: 13.5 % (ref 11.5–15.5)
WBC: 7.7 10*3/uL (ref 4.0–10.5)
nRBC: 0 % (ref 0.0–0.2)

## 2022-06-18 LAB — COMPREHENSIVE METABOLIC PANEL
ALT: 21 U/L (ref 0–44)
AST: 19 U/L (ref 15–41)
Albumin: 3 g/dL — ABNORMAL LOW (ref 3.5–5.0)
Alkaline Phosphatase: 64 U/L (ref 38–126)
Anion gap: 7 (ref 5–15)
BUN: 19 mg/dL (ref 8–23)
CO2: 26 mmol/L (ref 22–32)
Calcium: 8.8 mg/dL — ABNORMAL LOW (ref 8.9–10.3)
Chloride: 104 mmol/L (ref 98–111)
Creatinine, Ser: 1.11 mg/dL — ABNORMAL HIGH (ref 0.44–1.00)
GFR, Estimated: 54 mL/min — ABNORMAL LOW (ref >60)
Glucose, Bld: 191 mg/dL — ABNORMAL HIGH (ref 70–99)
Potassium: 4.5 mmol/L (ref 3.5–5.1)
Sodium: 137 mmol/L (ref 135–145)
Total Bilirubin: 0.4 mg/dL (ref 0.3–1.2)
Total Protein: 6.1 g/dL — ABNORMAL LOW (ref 6.5–8.1)

## 2022-06-18 LAB — GLUCOSE, CAPILLARY
Glucose-Capillary: 128 mg/dL — ABNORMAL HIGH (ref 70–99)
Glucose-Capillary: 177 mg/dL — ABNORMAL HIGH (ref 70–99)
Glucose-Capillary: 191 mg/dL — ABNORMAL HIGH (ref 70–99)
Glucose-Capillary: 203 mg/dL — ABNORMAL HIGH (ref 70–99)
Glucose-Capillary: 207 mg/dL — ABNORMAL HIGH (ref 70–99)
Glucose-Capillary: 284 mg/dL — ABNORMAL HIGH (ref 70–99)

## 2022-06-18 MED ORDER — METOCLOPRAMIDE HCL 5 MG/ML IJ SOLN
10.0000 mg | Freq: Four times a day (QID) | INTRAMUSCULAR | Status: AC
  Administered 2022-06-18 – 2022-06-19 (×6): 10 mg via INTRAVENOUS
  Filled 2022-06-18 (×7): qty 2

## 2022-06-18 MED ORDER — INSULIN ASPART 100 UNIT/ML IJ SOLN: 0.0000 [IU] | INTRAMUSCULAR | Status: DC

## 2022-06-18 MED ORDER — METFORMIN HCL 500 MG PO TABS
500.0000 mg | ORAL_TABLET | Freq: Two times a day (BID) | ORAL | Status: DC
  Administered 2022-06-18 – 2022-06-21 (×7): 500 mg via ORAL
  Filled 2022-06-18 (×7): qty 1

## 2022-06-18 MED ORDER — INSULIN ASPART 100 UNIT/ML IJ SOLN
0.0000 [IU] | Freq: Three times a day (TID) | INTRAMUSCULAR | Status: DC
  Administered 2022-06-18 (×2): 4 [IU] via SUBCUTANEOUS
  Administered 2022-06-18: 8 [IU] via SUBCUTANEOUS
  Administered 2022-06-19 (×2): 4 [IU] via SUBCUTANEOUS
  Administered 2022-06-19: 12 [IU] via SUBCUTANEOUS
  Administered 2022-06-20: 2 [IU] via SUBCUTANEOUS
  Administered 2022-06-20 (×3): 4 [IU] via SUBCUTANEOUS
  Administered 2022-06-21: 2 [IU] via SUBCUTANEOUS

## 2022-06-18 NOTE — Progress Notes (Signed)
Patient up to chair with 2 assist and rolling walker with nursing staff. Orthostatic blood pressures obtained/attempted during stand and pivot to chair. Patient states"she is nauseated/dizzy" but not as bad as yesterday. Orthostatic vital signs are as follows: Lying 143/70 HR 109 Sitting: 133/72 HR 119 Standing: hard to obtain 49/32 HR 119 (second reading)  Patient stated she needed to sit. She  sat in chair and reclined back and bp taken again 133/74 and after reclining patient states she feels "better".  Spouse at bedside. Call bell with in reach. Rosibel Giacobbe, Bettina Gavia RN

## 2022-06-18 NOTE — Care Management Important Message (Signed)
Important Message  Patient Details  Name: Megan [name] MRN: [id number] Date of Birth: 24-Dec-1950   Medicare Important Message Given:  Yes     Shelda Altes 06/18/2022, 9:50 AM

## 2022-06-18 NOTE — Progress Notes (Addendum)
[address]       [place] [address]             [phone]      2 Days Post-Op Procedure(s) (LRB): XI ROBOTIC ASSISTED THORACOSCOPY-LEFT LOWER LOBECTOMY (Left) LYMPH NODE DISSECTION (Left) INTERCOSTAL NERVE BLOCK (Left) Subjective: Sitting up in bed, just finished eating a full breakfast.  Had some nausea when she was helped out of bed to the chair yesterday.  Also complaining of pain at the chest tube insertion site.  Objective: Vital signs in last 24 hours: Temp:  [97.6 F (36.4 C)-98.6 F (37 C)] 97.6 F (36.4 C) (10/06 0419) Pulse Rate:  [69-75] 75 (10/06 0419) Cardiac Rhythm: Normal sinus rhythm (10/05 1900) Resp:  [14-18] 14 (10/06 0419) BP: (122-167)/(61-80) 139/79 (10/06 0419) SpO2:  [96 %-97 %] 96 % (10/06 0419)     Intake/Output from previous day: 10/05 0701 - 10/06 0700 In: 240.1 [P.O.:240; I.V.:0.1] Out: 1470 [Urine:1150; Chest Tube:320] Intake/Output this shift: No intake/output data recorded.  General appearance: alert, cooperative, and mild distress Neurologic: intact Heart: Regular rate and rhythm, monitor shows normal sinus rhythm Lungs: clear to auscultation bilaterally.  1+ airleak with cough.  Chest x-ray showing slight increase in the left lateral and subpulmonic pneumothorax.  Very mild increase in the subcu air. Abdomen: Soft, nontender. Wound: The port sites and chest tube insertion site are dry.  Lab Results: Recent Labs    06/17/22 0117 06/18/22 0102  WBC 9.4 7.7  HGB 12.1 12.7  HCT 34.3* 38.4  PLT 195 186   BMET:  Recent Labs    06/17/22 0117 06/18/22 0102  NA 132* 137  K 4.4 4.5  CL 100 104  CO2 23 26  GLUCOSE 231* 191*  BUN 15 19  CREATININE 1.10* 1.11*  CALCIUM 8.8* 8.8*    PT/INR: No results for input(s): "LABPROT", "INR" in the last 72 hours. ABG    Component Value Date/Time   HCO3 24.9 (H) 09/07/2007 1735   TCO2 26 09/07/2007 1735   CBG (last 3)  Recent Labs    06/17/22 2017  06/18/22 0011 06/18/22 0423  GLUCAP 247* 207* 128*    CLINICAL DATA:  [id number].  Follow-up post lobectomy of lung.   EXAM: PORTABLE CHEST 1 VIEW   COMPARISON:  Portable chest yesterday at 5:29 a.m.   FINDINGS: 6:45 a.m. Chest tube entering the lower lateral left thorax again courses medially than cephalad with tip in the medial left apex.   A minimal left apical pneumothorax appear similar but there is increased subpulmonic left pneumothorax today, in aggregate estimated at least 30% chest volume but without mediastinal shift.   There is increased subcutaneous emphysema in the lower lateral left chest wall also noted.   There is increased opacity in the partially collapsed left base consistent with atelectasis or infiltrate.   The remaining lungs clear with stable mediastinal configuration. The cardiac size is normal.   No significant pleural fluid is suspected. There is fusion hardware in the cervical and lower thoracic/lumbar spine.   IMPRESSION: 1. Increased subpulmonic left pneumothorax, stable minimal apical pneumothorax, today in aggregate estimated at least 30% chest volume with increased subcutaneous emphysema lower lateral chest wall. 2. No mediastinal shift is seen. 3. Increased opacity in the partially collapsed left base. No other focal infiltrates. 4. These results will be called to the ordering clinician or representative by the radiologist assistant, and communication documented in the PACS or [place] dashboard.  Electronically Signed   By: Telford Nab M.D.   On: 06/18/2022 07:14  Assessment/Plan: S/P Procedure(s) (LRB): XI ROBOTIC ASSISTED THORACOSCOPY-LEFT LOWER LOBECTOMY (Left) LYMPH NODE DISSECTION (Left) INTERCOSTAL NERVE BLOCK (Left) -Postop day 2 robotic assisted left lower lobectomy.  Continues to have a small air leak.  Chest tube is on waterseal.  Continue to encourage mobility.  -Type 2 diabetes mellitus-taking metformin 500 mg  twice daily prior to admission.  CBG elevated to 280 last evening.  We will resume the metformin and continue sliding scale.  -History of hypertension-amlodipine resumed.  Control is adequate.  We will resume other medications as required.  -DVT prophylaxis-continue to encourage mobility, enoxaparin 40 mg subcutaneously daily   LOS: 2 days    Antony Odea, PA-C [phone] 06/18/2022  Agree with above Small leak IS, ambulation  Malayzia Laforte O Chenee Munns

## 2022-06-19 ENCOUNTER — Inpatient Hospital Stay (HOSPITAL_COMMUNITY): Payer: PPO

## 2022-06-19 LAB — GLUCOSE, CAPILLARY
Glucose-Capillary: 175 mg/dL — ABNORMAL HIGH (ref 70–99)
Glucose-Capillary: 256 mg/dL — ABNORMAL HIGH (ref 70–99)
Glucose-Capillary: 269 mg/dL — ABNORMAL HIGH (ref 70–99)
Glucose-Capillary: 168 mg/dL — ABNORMAL HIGH (ref 70–99)

## 2022-06-19 MED ORDER — AMLODIPINE BESYLATE 5 MG PO TABS
5.0000 mg | ORAL_TABLET | Freq: Every day | ORAL | Status: DC
  Administered 2022-06-19 – 2022-06-21 (×3): 5 mg via ORAL
  Filled 2022-06-19 (×3): qty 1

## 2022-06-19 MED ORDER — LISINOPRIL 10 MG PO TABS
10.0000 mg | ORAL_TABLET | Freq: Every day | ORAL | Status: DC
  Administered 2022-06-19 – 2022-06-21 (×3): 10 mg via ORAL
  Filled 2022-06-19 (×3): qty 1

## 2022-06-19 MED ORDER — FUROSEMIDE 10 MG/ML IJ SOLN
40.0000 mg | Freq: Once | INTRAMUSCULAR | Status: AC
  Administered 2022-06-19: 40 mg via INTRAVENOUS
  Filled 2022-06-19: qty 4

## 2022-06-19 MED ORDER — SCOPOLAMINE 1 MG/3DAYS TD PT72
1.0000 | MEDICATED_PATCH | TRANSDERMAL | Status: DC
  Administered 2022-06-19: 1.5 mg via TRANSDERMAL
  Filled 2022-06-19: qty 1

## 2022-06-19 MED ORDER — METOPROLOL TARTRATE 5 MG/5ML IV SOLN: 5.0000 mg | Freq: Four times a day (QID) | INTRAVENOUS | Status: DC | PRN

## 2022-06-19 MED ORDER — HYDRALAZINE HCL 20 MG/ML IJ SOLN
10.0000 mg | INTRAMUSCULAR | Status: DC | PRN
  Administered 2022-06-20: 10 mg via INTRAVENOUS
  Filled 2022-06-19 (×2): qty 1

## 2022-06-19 NOTE — Progress Notes (Addendum)
      [address]       [place] [address]             [phone]      3 Days Post-Op Procedure(s) (LRB): XI ROBOTIC ASSISTED THORACOSCOPY-LEFT LOWER LOBECTOMY (Left) LYMPH NODE DISSECTION (Left) INTERCOSTAL NERVE BLOCK (Left) Subjective: Sitting up in bed, complains of persistent nausea. Has no appetite today. Denies abdominal pain. No BM since surgery.  Says she uses laxatives regularly at home.   Was hypotensive yesterday with orthostatic BP changes. Amlodipine had bee held past few days. Hypertensive this morning.   Objective: Vital signs in last 24 hours: Temp:  [97.4 F (36.3 C)-98 F (36.7 C)] 97.7 F (36.5 C) (10/07 0745) Pulse Rate:  [75-89] 89 (10/07 0333) Cardiac Rhythm: Normal sinus rhythm (10/07 0834) Resp:  [15-22] 21 (10/07 0745) BP: (49-185)/(32-92) 185/92 (10/07 0745) SpO2:  [95 %-99 %] 98 % (10/07 0745)     Intake/Output from previous day: 10/06 0701 - 10/07 0700 In: 600 [P.O.:600] Out: 1915 [UUEKC:0034; Chest Tube:140] Intake/Output this shift: No intake/output data recorded.  General appearance: alert, cooperative, and mild distress Neurologic: intact Heart: Regular rate and rhythm, monitor shows normal sinus rhythm Lungs: clear to auscultation bilaterally.  1-2+ airleak with cough.  Chest x-ray showing slight stable apical and subpulmonic pneumothorax.  No palpable subcu air. Abdomen: Soft, nontender. Active bowel sounds. Wound: The port sites and chest tube insertion site are dry.  Lab Results: Recent Labs    06/17/22 0117 06/18/22 0102  WBC 9.4 7.7  HGB 12.1 12.7  HCT 34.3* 38.4  PLT 195 186    BMET:  Recent Labs    06/17/22 0117 06/18/22 0102  NA 132* 137  K 4.4 4.5  CL 100 104  CO2 23 26  GLUCOSE 231* 191*  BUN 15 19  CREATININE 1.10* 1.11*  CALCIUM 8.8* 8.8*     PT/INR: No results for input(s): "LABPROT", "INR" in the last 72 hours. ABG    Component Value Date/Time   HCO3 24.9 (H) 09/07/2007 1735    TCO2 26 09/07/2007 1735   CBG (last 3)  Recent Labs    06/18/22 1559 06/18/22 2113 06/19/22 0608  GLUCAP 203* 191* 175*     Assessment/Plan: S/P Procedure(s) (LRB): XI ROBOTIC ASSISTED THORACOSCOPY-LEFT LOWER LOBECTOMY (Left) LYMPH NODE DISSECTION (Left) INTERCOSTAL NERVE BLOCK (Left) -Postop day 2 3robotic assisted left lower lobectomy.  Continues to have a small air leak and stable thoracic space after lobectomy.  Chest tube is on waterseal.  Continue to encourage mobility.  -Type 2 diabetes mellitus- Glucose 170-200. Continue metformin and SSI.  -History of hypertension- Amlodipine had been held tor hypotension.  Amlodipine resumed. BP ~180 this morning. Will also resume her lisinopril.   -Nausea- abd exam remains benign.  On Protonix and Reglan along with PRN Zofran.   -DVT prophylaxis-continue to encourage mobility, enoxaparin 40 mg subcutaneously daily   LOS: 3 days     Myron G. Roddenberry, PA-C       Adding scopolamine Titrating BP meds IS, ambulation  Megan [name]

## 2022-06-19 NOTE — Progress Notes (Addendum)
Orthostatic vital signs  Lying 191/95 Sitting 203/101 Standing 169/102 Pt's hr increased from 100 to 145 while standing pt complain of general un well feeling. Pt placed in chair after 2 to 3 minutes pt back to baseline repeat BP 183/91  Dr Lowella Dell notified he ordered to restart amolipidine, 10 of hydralazineIV  q 4 hours prn

## 2022-06-19 NOTE — Progress Notes (Signed)
Pt called out complaining of feeling nauseated, weak and lightheaded.  She's been sitting up in recliner for over an hr. Sitting BP 158/82, HR 104.  Assisted back to bed with walker.  Was unable to assess while standing, but pressure upon back to bed 185/92, HR 130-140s while moving, back down to 100s lying.   Tremors noted for about a minute, now resolved.  Continuing to feel nauseated.  PA expected to make rounds soon.

## 2022-06-20 ENCOUNTER — Inpatient Hospital Stay (HOSPITAL_COMMUNITY): Payer: PPO

## 2022-06-20 LAB — GLUCOSE, CAPILLARY
Glucose-Capillary: 189 mg/dL — ABNORMAL HIGH (ref 70–99)
Glucose-Capillary: 175 mg/dL — ABNORMAL HIGH (ref 70–99)
Glucose-Capillary: 189 mg/dL — ABNORMAL HIGH (ref 70–99)
Glucose-Capillary: 159 mg/dL — ABNORMAL HIGH (ref 70–99)

## 2022-06-20 NOTE — Progress Notes (Signed)
   06/20/22 0222  Vitals  Temp 97.6 F (36.4 C)  Temp Source Oral  BP (!) 175/79  MAP (mmHg) 108  BP Location Right Arm  BP Method Automatic  Patient Position (if appropriate) Lying  Pulse Rate 88  Pulse Rate Source Monitor  ECG Heart Rate 88  Resp 16    10 mg hydralazine prn given

## 2022-06-20 NOTE — Progress Notes (Signed)
Chest tube removed as per orders.  Pt tolerated well.  Resting in bed now, reporting much pain relief after CT out.  VS stable.

## 2022-06-20 NOTE — Progress Notes (Addendum)
      [address]       [place] [address]             [phone]      4 Days Post-Op Procedure(s) (LRB): XI ROBOTIC ASSISTED THORACOSCOPY-LEFT LOWER LOBECTOMY (Left) LYMPH NODE DISSECTION (Left) INTERCOSTAL NERVE BLOCK (Left) Subjective: Feeling much better today.  Nausea resolved but she has not been out of bed yet.  She was able to eat all of her breakfast this morning.  Objective: Vital signs in last 24 hours: Temp:  [97.6 F (36.4 C)-98 F (36.7 C)] 97.9 F (36.6 C) (10/08 0715) Pulse Rate:  [79-106] 106 (10/08 0715) Cardiac Rhythm: Sinus tachycardia (10/08 0824) Resp:  [13-20] 16 (10/08 0715) BP: (127-179)/(66-83) 149/66 (10/08 0715) SpO2:  [96 %-98 %] 97 % (10/08 0715)     Intake/Output from previous day: 10/07 0701 - 10/08 0700 In: 120 [P.O.:120] Out: 1630 [Urine:1550; Chest Tube:80] Intake/Output this shift: No intake/output data recorded.  General appearance: alert, cooperative, and mild distress Neurologic: intact Heart: Regular rate and rhythm, monitor shows normal sinus rhythm Lungs: clear to auscultation bilaterally.   Air leak has slowed but has not resolved.  The CT connections are secure. Chest x-ray is stable.   No palpable subcu air. Abdomen: Soft, nontender. Active bowel sounds. Wound: The port sites and chest tube insertion site are dry.  Lab Results: Recent Labs    06/18/22 0102  WBC 7.7  HGB 12.7  HCT 38.4  PLT 186    BMET:  Recent Labs    06/18/22 0102  NA 137  K 4.5  CL 104  CO2 26  GLUCOSE 191*  BUN 19  CREATININE 1.11*  CALCIUM 8.8*     PT/INR: No results for input(s): "LABPROT", "INR" in the last 72 hours. ABG    Component Value Date/Time   HCO3 24.9 (H) 09/07/2007 1735   TCO2 26 09/07/2007 1735   CBG (last 3)  Recent Labs    06/19/22 1646 06/19/22 2137 06/20/22 0628  GLUCAP 269* 168* 189*     Assessment/Plan: S/P Procedure(s) (LRB): XI ROBOTIC ASSISTED THORACOSCOPY-LEFT LOWER  LOBECTOMY (Left) LYMPH NODE DISSECTION (Left) INTERCOSTAL NERVE BLOCK (Left)  -Postop day 4 robotic assisted left lower lobectomy.  Continues to have a small air leak but it seems to be slowing.  Chest tube is on waterseal.  Chest x-ray is stable. Path is pending.  Continue to encourage mobility.  -Type 2 diabetes mellitus- Glucose 170-260. Continue metformin and SSI.  -History of hypertension- On amlodipine and lisinopril. BP around 150 this morning.  -Nausea- Improved with Scopolamine. Abd exam remains benign.  On Protonix and Reglan along with PRN Zofran.   -DVT prophylaxis-continue to encourage mobility, enoxaparin 40 mg subcutaneously daily   LOS: 4 days    Antony Odea, PA-C    Agree with above Will remove CT today  [name]

## 2022-06-20 NOTE — Progress Notes (Signed)
Pt up to recliner from bed, standby assist.  She said she felt a good bit better than yesterday, nausea present, but decreased significantly.

## 2022-06-21 ENCOUNTER — Inpatient Hospital Stay (HOSPITAL_COMMUNITY): Payer: PPO

## 2022-06-21 LAB — GLUCOSE, CAPILLARY: Glucose-Capillary: 154 mg/dL — ABNORMAL HIGH (ref 70–99)

## 2022-06-21 LAB — SURGICAL PATHOLOGY

## 2022-06-21 MED ORDER — TRAMADOL HCL 50 MG PO TABS: 50.0000 mg | ORAL_TABLET | Freq: Four times a day (QID) | ORAL | 0 refills | Status: AC | PRN

## 2022-06-21 MED ORDER — BACLOFEN 10 MG PO TABS: 10.0000 mg | ORAL_TABLET | Freq: Two times a day (BID) | ORAL | Status: DC

## 2022-06-21 NOTE — Discharge Instructions (Signed)

## 2022-06-21 NOTE — Progress Notes (Signed)
Pt oob HR increased 150's says nausea is better ambulated in \ room 20 feet , after about 3 minutes pt's HR down to 100's

## 2022-06-21 NOTE — Plan of Care (Signed)
DISCHARGE NOTE HOME Megan [name] to be discharged home per MD order. Discussed prescriptions and follow up appointments with the patient. Medication list explained in detail. Patient verbalized understanding.  Skin clean, dry and intact without evidence of skin break down, no evidence of skin tears noted. IV catheter discontinued intact. Site without signs and symptoms of complications. Dressing and pressure applied. Pt denies pain at the site currently. No complaints noted.  Patient free of lines, drains, and wounds.   An After Visit Summary (AVS) was printed and given to the patient. Patient escorted via wheelchair, and discharged home via private auto.  Stephan Minister, RN

## 2022-06-21 NOTE — Progress Notes (Signed)
[address]       [place] [address]             [phone]      5 Days Post-Op Procedure(s) (LRB): XI ROBOTIC ASSISTED THORACOSCOPY-LEFT LOWER LOBECTOMY (Left) LYMPH NODE DISSECTION (Left) INTERCOSTAL NERVE BLOCK (Left) Subjective: Feels pretty well overall  Objective: Vital signs in last 24 hours: Temp:  [97.6 F (36.4 C)-98 F (36.7 C)] 97.8 F (36.6 C) (10/09 0525) Pulse Rate:  [78-106] 99 (10/09 0525) Cardiac Rhythm: Normal sinus rhythm (10/08 1900) Resp:  [15-17] 17 (10/09 0525) BP: (130-154)/(62-81) 154/81 (10/09 0525) SpO2:  [96 %-98 %] 96 % (10/09 0525)  Hemodynamic parameters for last 24 hours:    Intake/Output from previous day: 10/08 0701 - 10/09 0700 In: -  Out: 700 [Urine:700] Intake/Output this shift: No intake/output data recorded.  General appearance: alert, cooperative, and no distress Heart: regular rate and rhythm Lungs: clear to auscultation bilaterally Abdomen: benign Extremities: no edema  Wound: incis healing well  Lab Results: No results for input(s): "WBC", "HGB", "HCT", "PLT" in the last 72 hours. BMET: No results for input(s): "NA", "K", "CL", "CO2", "GLUCOSE", "BUN", "CREATININE", "CALCIUM" in the last 72 hours.  PT/INR: No results for input(s): "LABPROT", "INR" in the last 72 hours. ABG    Component Value Date/Time   HCO3 24.9 (H) 09/07/2007 1735   TCO2 26 09/07/2007 1735   CBG (last 3)  Recent Labs    06/20/22 1608 06/20/22 2142 06/21/22 0619  GLUCAP 189* 159* 154*    Meds Scheduled Meds:  acetaminophen  1,000 mg Oral Q6H   Or   acetaminophen (TYLENOL) oral liquid 160 mg/5 mL  1,000 mg Oral Q6H   amLODipine  5 mg Oral Daily   atorvastatin  40 mg Oral Daily   baclofen  10 mg Oral BID   bisacodyl  10 mg Oral Daily   enoxaparin (LOVENOX) injection  40 mg Subcutaneous Daily   gabapentin  300 mg Oral BID   insulin aspart  0-24 Units Subcutaneous TID AC & HS   lisinopril  10 mg Oral Daily    loratadine  10 mg Oral Daily   metFORMIN  500 mg Oral BID WC   oxybutynin  5 mg Oral QHS   pantoprazole  40 mg Oral Daily   polyvinyl alcohol  1-2 drop Both Eyes BID   scopolamine  1 patch Transdermal Q72H   senna-docusate  1 tablet Oral QHS   Continuous Infusions:  sodium chloride Stopped (06/16/22 2139)   PRN Meds:.sodium chloride, fentaNYL (SUBLIMAZE) injection, hydrALAZINE, metoprolol tartrate, ondansetron (ZOFRAN) IV, traMADol  Xrays DG Chest 2 View  Result Date: 06/21/2022 CLINICAL DATA:  Status post left lobectomy.  Surgery follow-up. EXAM: CHEST - 2 VIEW COMPARISON:  AP chest 06/20/2022, 06/19/2022, 06/18/2022, 06/17/2022 FINDINGS: Small left apical pneumothorax appears not significantly changed from most recent 06/20/2022 at 6:55 a.m. radiograph. There is again left lower lung heterogeneous airspace opacification, likely atelectasis. Inferolateral left chest wall subcutaneous air is similar to prior, in the region of the prior left chest tube approach. Right lung remains clear. No pleural effusion or pneumothorax. Moderate multilevel degenerative disc changes of the thoracic spine. Partial visualization of thoracolumbar posterior fusion hardware. Lower cervical spine ACDF hardware. IMPRESSION: 1. Interval removal of left-sided chest tube. Small left apical pneumothorax appears not significantly changed from most recent radiographs 06/20/2022 at 6:55 AM. 2. Unchanged left lower lung heterogeneous airspace opacification, likely atelectasis. Electronically Signed  By: Yvonne Kendall M.D.   On: 06/21/2022 06:40   DG CHEST PORT 1 VIEW  Result Date: 06/20/2022 CLINICAL DATA:  Status post left-sided lobectomy EXAM: PORTABLE CHEST 1 VIEW COMPARISON:  Prior chest x-ray yesterday 06/19/2022 FINDINGS: Left-sided chest tube in unchanged position. Persistent small left apical and left basilar pneumothorax with associated left basilar atelectasis. Right to left shift of the cardiac and mediastinal  structures consistent with volume loss related to recent lobectomy. The right lung remains clear and well aerated. Partially imaged surgical changes of prior cervical and thoracolumbar fusions. IMPRESSION: Stable chest x-ray without evidence of interval change. Persistent small left apical and basilar pneumothorax with chest tube in place. Electronically Signed   By: Jacqulynn Cadet M.D.   On: 06/20/2022 08:10   DG CHEST PORT 1 VIEW  Result Date: 06/19/2022 CLINICAL DATA:  Follow-up post lobectomy of the left lung. EXAM: PORTABLE CHEST 1 VIEW COMPARISON:  Multiple chest x-rays, most recently June 18, 2022 FINDINGS: Stable cardiomediastinal contours. Stable positioning of left chest tube with improved aeration of the left lung base. Similar size of a moderate-sized left pneumothorax. Unchanged left chest wall subcutaneous emphysema, presumably postoperative. No new focal pulmonary opacity. No right pleural effusion or pneumothorax. Partially visualized thoracic fusion hardware. No acute osseous abnormality. IMPRESSION: 1. Improved aeration of the left lung base. 2. Unchanged moderate left pneumothorax with left chest tube in place. Electronically Signed   By: Beryle Flock M.D.   On: 06/19/2022 09:54    Assessment/Plan: S/P Procedure(s) (LRB): XI ROBOTIC ASSISTED THORACOSCOPY-LEFT LOWER LOBECTOMY (Left) LYMPH NODE DISSECTION (Left) INTERCOSTAL NERVE BLOCK (Left)  POD#5  1 afeb, VSS s BP 130-150's, sinus rhythm 2 sats good on RA 3 CXR stable w/ CT out 4 no new labs 5 BS adeq control- cont glucophage 6 stable for discharge    LOS: 5 days    John Giovanni PA-C [phone] 06/21/2022

## 2022-06-22 ENCOUNTER — Telehealth: Payer: Self-pay | Admitting: *Deleted

## 2022-06-22 NOTE — Patient Outreach (Signed)
  Care Coordination TOC Note Transition Care Management Follow-up Telephone Call Date of discharge and from where: [facility] 07/01/2022 How have you been since you were released from the hospital? I have some pain but it is manageable when  I set in my chair. I am a little woozy when I get it. RN explained to patient to get up slowly an stand for a few seconds before ambulating.  Any questions or concerns? No  Items Reviewed: Did the pt receive and understand the discharge instructions provided? Yes  Medications obtained and verified? Yes  Other? No  Any new allergies since your discharge? No  Dietary orders reviewed? No Do you have support at home? Yes   Home Care and Equipment/Supplies: Were home health services ordered? no If so, what is the name of the agency? N/a  Has the agency set up a time to come to the patient's home? not applicable Were any new equipment or medical supplies ordered?  No What is the name of the medical supply agency? N/a Were you able to get the supplies/equipment? not applicable Do you have any questions related to the use of the equipment or supplies? No  Functional Questionnaire: (I = Independent and D = Dependent) ADLs: I  Bathing/Dressing- I  Meal Prep- I  Eating- I  Maintaining continence- I  Transferring/Ambulation- I  Managing Meds- I  Follow up appointments reviewed:  PCP Hospital f/u appt confirmed? Yes  Scheduled to see Alma Friendly 06/29/2022 9:00 [facility] f/u appt confirmed? Yes  Scheduled to see Dr Kipp Brood 07/01/2022 12:10 with xray 30 minutes before  Are transportation arrangements needed? No  If their condition worsens, is the pt aware to call PCP or go to the Emergency Dept.? Yes Was the patient provided with contact information for the PCP's office or ED? Yes Was to pt encouraged to call back with questions or concerns? Yes  SDOH assessments and interventions completed:   Yes  Care Coordination Interventions  Activated:  Yes   Care Coordination Interventions:  No Care Coordination interventions needed at this time.   Encounter Outcome:  Pt. Visit Completed    [facility] [phone]

## 2022-06-24 ENCOUNTER — Telehealth: Payer: Self-pay

## 2022-06-24 NOTE — Chronic Care Management (AMB) (Addendum)
Chronic Care Management Pharmacy Assistant   Name: Megan [name]  MRN: [id number] DOB: 1950/09/18  Reason for Encounter: Hospital Follow Up  Non CCM   Medications: Outpatient Encounter Medications as of 06/24/2022  Medication Sig   amLODipine (NORVASC) 5 MG tablet Take 1 tablet (5 mg total) by mouth daily.   atorvastatin (LIPITOR) 40 MG tablet Take 1 tablet (40 mg total) by mouth daily. For cholesterol.   baclofen (LIORESAL) 10 MG tablet Take 1 tablet (10 mg total) by mouth 2 (two) times daily.   Biotin 5000 MCG CAPS Take 5,000 mcg by mouth every morning.    cetirizine (ZYRTEC) 10 MG tablet Take 1 tablet (10 mg total) by mouth daily. For allergies   Cholecalciferol (VITAMIN D) 50 MCG (2000 UT) tablet Take 2,000 Units by mouth daily.   ciprofloxacin (CIPRO) 500 MG tablet Take 1 tablet (500 mg total) by mouth 2 (two) times daily.   Coenzyme Q10 (COQ10) 100 MG CAPS Take 100 mg by mouth every evening.   esomeprazole (NEXIUM 24HR) 20 MG capsule Take 1 capsule (20 mg total) by mouth daily at 12 noon.   fluocinonide cream (LIDEX) 2.22 % Apply 1 Application topically 2 (two) times daily as needed (irritation).   gabapentin (NEURONTIN) 300 MG capsule Take 1 capsule (300 mg total) by mouth 2 (two) times daily. For pain.   ibuprofen (ADVIL) 200 MG tablet Take 600 mg by mouth every 8 (eight) hours as needed (pain).   lisinopril (ZESTRIL) 20 MG tablet TAKE 1 TABLET(20 MG) BY MOUTH DAILY FOR BLOOD PRESSURE   metFORMIN (GLUCOPHAGE) 500 MG tablet TAKE 1 TABLET BY MOUTH TWICE DAILY FOR DIABETES   nystatin-triamcinolone ointment (MYCOLOG) Apply 1 Application topically 2 (two) times daily as needed (irritation).   oxybutynin (DITROPAN-XL) 5 MG 24 hr tablet TAKE 1 TABLET(5 MG) BY MOUTH AT BEDTIME FOR OVERACTIVE BLADDER   oxybutynin (OXYTROL) 3.9 MG/24HR Place 1 patch onto the skin See admin instructions. Change every 5 days   Polyethyl Glycol-Propyl Glycol (LUBRICANT EYE DROPS) 0.4-0.3 % SOLN Place 1-2  drops into both eyes 2 (two) times daily.   senna-docusate (SENOKOT-S) 8.6-50 MG tablet Take 2 tablets by mouth 2 (two) times daily. (Patient taking differently: Take 1 tablet by mouth every other day.)   traMADol (ULTRAM) 50 MG tablet Take 1 tablet (50 mg total) by mouth every 6 (six) hours as needed for up to 7 days (mild pain).   No facility-administered encounter medications on file as of 06/24/2022.   Reviewed hospital notes for details of recent visit. Has patient been contacted by Transitions of Care team? Yes Has patient seen PCP/specialist for hospital follow up (summarize OV if yes): Yes  06/14/22  Fam.Med -Suspect stress may be complicating her elevated blood pressure. 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 Reduce amlodipine to 1/2 tablet (2.5 mg) if blood pressure or feeling lightheaded or dizzy,f/u 2 weeks  Admitted to the ED on 06/12/22. Discharge date was 06/12/22.  Discharged from [facility] ED   Discharge diagnosis (Principal Problem): Essential HTN Patient was discharged to Home  Brief summary of hospital course: Patient is 71 year old female, here for high blood pressure, she has no chest pain, shortness of breath, dizziness, headache, abdominal pain.  Found to have elevated blood pressure, given amlodipine 5 mg total, with some improvement, of blood pressure, however still elevated.  She and I discussed the onset of amlodipine, and  need for for several more hours.  She would like to go home at this time, and check her blood pressure in the morning.  She is agreeable to this.  She was sent amlodipine 5 mg outpatient, in the form of 2.5 mg that she can take up to twice a day.  Depending on how her blood pressure is in the morning.  Advised patient that if blood pressure is low tomorrow, to only take 2.5 mg daily in addition to her lisinopril.    New?Medications Started at  [facility] Discharge:?? -Started amlodipine 2.5 mg po daily   Medications that remain the same after Hospital Discharge:??  -All other medications will remain the same.    Next CCM appt: none   Other upcoming appts:  06/14/22  Family Medicine PCP appointment on 06/29/22  Charlene Brooke, PharmD notified and will determine if action is needed.   Avel Sensor, [place]  [phone]  Pharmacist addendum: Patient has had appropriate follow up, medications were adjusted. Nothing further needed at this time.  Charlene Brooke, PharmD, BCACP 06/29/22 9:55 AM

## 2022-06-28 NOTE — Progress Notes (Signed)
[name]       [place] [address]             [phone]        Megan [name] [place] Medical Record #[id number] Date of Birth: [birth year]  Referring: Garner Nash, DO Primary Care: Pleas Koch, NP Primary Cardiologist:None  Reason for visit:   follow-up  History of Present Illness:     71yo female presents for her 1 week f/u appointment after a LLLectomy.  She has no complaints today.  Physical Exam: BP (!) 145/78   Pulse 69   Resp 20   Ht 5\' 8"  (1.727 m)   Wt 170 lb (77.1 kg)   SpO2 96% Comment: RA  BMI 25.85 kg/m   Alert NAD Incision clean.   Abdomen, ND No peripheral edema   Diagnostic Studies & Laboratory data:  Path:  FINAL MICROSCOPIC DIAGNOSIS:   A. LYMPH NODE, 9L #1, EXCISION:  One benign lymph node, negative for carcinoma (0/1)   B. LYMPH NODE, 9L #2, EXCISION:  One benign lymph node, negative for carcinoma (0/1)   C. LYMPH NODE, HILAR #1, EXCISION:  One benign lymph node, negative for carcinoma (0/1)   D. LYMPH NODE, HILAR #2, EXCISION:  One benign lymph node, negative for carcinoma (0/1)   E. LYMPH NODE, HILAR #3, EXCISION:  One benign lymph node, negative for carcinoma (0/1)   F. LYMPH NODE, HILAR #4, EXCISION:  One benign lymph node, negative for carcinoma (0/1)   G. LYMPH NODE, STATION 6, EXCISION:  One benign lymph node, negative for carcinoma (0/1)   H. LYMPH NODE, STATION 5, EXCISION:  One benign lymph node, negative for carcinoma (0/1)   I. 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 and spread through airspaces  Tumor measures 5.5 x 4.2 x 2.2 cm (pT3)  Tumor invades the visceral pleura (PL1)  Bronchial and vascular margins free  Two benign lymph node (0/2)  Emphysematous changes of nonneoplastic lung   ONCOLOGY TABLE:   LUNG: Resection   Synchronous Tumors: Not applicable  Total Number of Primary Tumors: 1   Procedure: Lobectomy with node dissection  Specimen Laterality: Left  Tumor Focality: Unifocal  Tumor Site: Lower lobe  Tumor Size:       Total Tumor Size: 5.5 x 4.2 x 2.2 cm  Histologic Type: Mucinous adenocarcinoma  Visceral Pleura Invasion: Present  Direct Invasion of Adjacent Structures: No adjacent structures present  Lymphovascular Invasion: Not identified  Margins: All margins negative for invasive carcinoma       Closest Margin(s) to Invasive Carcinoma: 2.5 cm from bronchial and  vascular margins  Treatment Effect: No known presurgical therapy  Regional Lymph Nodes:       Number of Lymph Nodes Involved: 0       Number of Lymph Nodes Examined: 10                       Nodal Sites Examined: Stations 5, 6, 9L, 10 L, and  11 L  Metastasis: Not applicable  Pathologic Stage Classification (pTNM, AJCC 8th Edition): pT3, pN0     Assessment / Plan:   71yo female s/p LLLectomy for a T3N0M0 Stage II b adenocarcinoma.  She will be referred to medical oncology for further evaluation.  She will f/u with [place] in 1 month with a CXR.     Megan [name] 07/02/2022  4:02 PM

## 2022-06-29 ENCOUNTER — Ambulatory Visit (INDEPENDENT_AMBULATORY_CARE_PROVIDER_SITE_OTHER): Payer: PPO | Admitting: Primary Care

## 2022-06-29 ENCOUNTER — Encounter: Payer: Self-pay | Admitting: Primary Care

## 2022-06-29 VITALS — BP 164/87 | HR 67 | Temp 97.3°F

## 2022-06-29 DIAGNOSIS — M4727 Other spondylosis with radiculopathy, lumbosacral region: Secondary | ICD-10-CM | POA: Diagnosis not present

## 2022-06-29 DIAGNOSIS — M62838 Other muscle spasm: Secondary | ICD-10-CM | POA: Diagnosis not present

## 2022-06-29 DIAGNOSIS — I10 Essential (primary) hypertension: Secondary | ICD-10-CM | POA: Diagnosis not present

## 2022-06-29 DIAGNOSIS — R350 Frequency of micturition: Secondary | ICD-10-CM | POA: Diagnosis not present

## 2022-06-29 DIAGNOSIS — N3281 Overactive bladder: Secondary | ICD-10-CM | POA: Diagnosis not present

## 2022-06-29 DIAGNOSIS — E785 Hyperlipidemia, unspecified: Secondary | ICD-10-CM

## 2022-06-29 DIAGNOSIS — E1165 Type 2 diabetes mellitus with hyperglycemia: Secondary | ICD-10-CM

## 2022-06-29 DIAGNOSIS — Z902 Acquired absence of lung [part of]: Secondary | ICD-10-CM

## 2022-06-29 DIAGNOSIS — M792 Neuralgia and neuritis, unspecified: Secondary | ICD-10-CM | POA: Diagnosis not present

## 2022-06-29 LAB — POC URINALSYSI DIPSTICK (AUTOMATED)
Bilirubin, UA: NEGATIVE
Blood, UA: NEGATIVE
Glucose, UA: NEGATIVE
Ketones, UA: NEGATIVE
Leukocytes, UA: NEGATIVE
Nitrite, UA: POSITIVE
Protein, UA: NEGATIVE
Spec Grav, UA: 1.02 (ref 1.010–1.025)
Urobilinogen, UA: 0.2 E.U./dL
pH, UA: 5.5 (ref 5.0–8.0)

## 2022-06-29 LAB — MICROALBUMIN / CREATININE URINE RATIO
Creatinine,U: 100.5 mg/dL
Microalb Creat Ratio: 1.3 mg/g (ref 0.0–30.0)
Microalb, Ur: 1.3 mg/dL (ref 0.0–1.9)

## 2022-06-29 MED ORDER — AMLODIPINE BESYLATE 10 MG PO TABS: 10.0000 mg | ORAL_TABLET | Freq: Every day | ORAL | 3 refills | Status: DC

## 2022-06-29 NOTE — Assessment & Plan Note (Signed)
Overall controlled.  Continue oxybutynin XL 5 mg daily, oxybutynin 3.9 mg patches every 5 days.

## 2022-06-29 NOTE — Patient Instructions (Signed)
We increased your dose of amlodipine to 10 mg daily for blood pressure.  I sent a new prescription to your pharmacy.  Continue taking lisinopril 20 mg daily for blood pressure.  Monitor your blood pressure at home and notify me if you continue to notice readings consistently at or above 140 on top and/or 90 on bottom.  Schedule a lab appointment for late December to repeat your diabetes and cholesterol test.  Schedule a follow-up visit for 6 months from now.  It was a pleasure to see you today!

## 2022-06-29 NOTE — Progress Notes (Signed)
Subjective:    Patient ID: Megan [name], female    DOB: 1951/04/23, 71 y.o.   MRN: [id number]  HPI  Megan [name] is a very pleasant 71 y.o. female with a history of hypertension, type 2 diabetes, paraplegia, lumbosacral spondylosis with radiculopathy, overactive bladder, hyperlipidemia, neuropathic pain, chronic pain syndrome, adenocarcinoma, lobectomy who 71 presents today for follow-up of chronic conditions.  1) Essential Hypertension: Currently managed on amlodipine 5 mg daily, lisinopril 20 mg daily. She is checking BP at home which is running 110's-150's/60's-80's. She denies dizziness, headaches, blurred vision.   BP Readings from Last 3 Encounters:  06/29/22 (!) 164/87  06/21/22 (!) 147/71  06/14/22 (!) 152/82   2) Left Lobectomy: She is S/P left lobectomy on 06/16/2022.  She tolerated procedure well, however did experience small air leak, small left apical pneumothorax, and acute kidney injury.  She underwent follow-up chest x-ray on 06/21/2022 which showed unchanged small left apical pneumothorax.  Chest tube was removed.  She underwent transfusion of 1 unit of PRBCs for mild anemia.  She has follow up scheduled with her surgeon this week. She is using her incentive spirometer several times daily. She denies   3) Type 2 Diabetes:  Current medications include: Metformin 500 mg twice daily.  She is checking her blood glucose 2 times daily and is getting readings of mid 100's.   Last A1C: 7.1 in September 2023 Last Eye Exam: Up-to-date Last Foot Exam: Up-to-date Pneumonia Vaccination: 2022 Urine Microalbumin: Due Statin: Atorvastatin  Dietary changes since last visit: None.   Exercise: None.   4) Chronic Back Pain/Chronic Pain Syndrome: Currently managed on baclofen 10 mg twice daily for which she takes everyday, gabapentin 300 mg twice daily. Her Celebrex was discontinued during her surgery. She's not noticed increased pain since discontinuation.   5) Overactive  Bladder: Currently managed on oxybutynin XL 5 mg daily and oxybutynin 3.9 mg patch every five days. She purchases the patches OTC. Overall she's noticed improvement with the combination of this regimen.   She believes she has a UTI with symptoms of urinary frequency and suprapubic pain. She was treated with ciprofloxacin 500 mg BID X 5 days during her hospital stay, completed course around 06/19/22. Her symptoms improved temporarily but returned.     Review of Systems  Constitutional:  Negative for fever.  Respiratory:  Negative for shortness of breath.   Cardiovascular:  Negative for chest pain.  Gastrointestinal:  Positive for constipation. Negative for diarrhea.  Genitourinary:  Positive for frequency and pelvic pain.  Neurological:  Negative for dizziness and headaches.         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   Constipation due to pain medication    Debility    Depression    Dyspnea    W/ PHYS CONDITION    Emphysema of lung ([place])    Foul smelling urine 07/22/2020   GERD (gastroesophageal reflux disease)    History of bronchitis    "not since I quit smoking" (07/26/2017)   History of kidney stones    History of shingles    Hyperlipidemia    takes Fish Oil daily   Hypertension    Hypoalbuminemia due to protein-calorie malnutrition ([place])    L3 vertebral fracture ([place]) 09/01/2017   Lung cancer ([place]) 2023   Myelopathy ([place])  12/21/2016   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   Neuromuscular disorder ([facility])    tingling toes   Paraparesis ([place]) 12/15/2016   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[place]) 12/15/2016   Pneumonia 2009   PONV (postoperative nausea and vomiting)    Radiculopathy 07/28/2017   Restless leg    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   Type 2 diabetes mellitus ([place])    Uterine cancer ([place]) 1979   S/P hysterectomy   Weakness    numbness and tingling in both feet r/t back    Social History   Socioeconomic History   Marital status: Married    Spouse name: [name]   Number of children: 1   Years of education: 12   Highest education level: Not on file  Occupational History   Not on file  Tobacco Use   Smoking status: Former    Packs/day: 1.50    Years: 35.00    Total pack years: 52.50    Types: Cigarettes    Quit date: 05/13/2006    Years since quitting: 16.1   Smokeless tobacco: Never  Vaping Use   Vaping Use: Never used  Substance and Sexual Activity   Alcohol use: Yes    Alcohol/week: 2.0 standard drinks of alcohol    Types: 2 Glasses of wine per week   Drug use: No   Sexual activity: Not Currently    Birth control/protection: Surgical  Other Topics Concern   Not on file  Social History Narrative   Lives w/ husband   Married.   1 child, 1 grandchildren.   Retired. Once worked for [employer].   Enjoys reading.    Social Determinants of Health   Financial Resource Strain: Low Risk  (01/01/2022)   Overall Financial Resource Strain (CARDIA)    Difficulty of Paying Living Expenses: Not hard at all  Food Insecurity: No Food Insecurity (06/16/2022)   Hunger Vital Sign    Worried About Running Out of Food in the Last Year: Never true    Ran Out of Food in the Last Year: Never true  Transportation Needs: No Transportation Needs (06/16/2022)   PRAPARE - Hydrologist (Medical): No    Lack of Transportation (Non-Medical): No  Physical Activity: Insufficiently Active (01/01/2022)   Exercise Vital Sign    Days of Exercise per Week: 2 days    Minutes of Exercise per Session: 20 min  Stress: No Stress Concern Present (12/31/2020)   [place]    Feeling of  Stress : Not at all  Social Connections: Moderately Isolated (01/01/2022)   Social Connection and Isolation Panel [NHANES]    Frequency of Communication with Friends and Family: Twice a week    Frequency of Social Gatherings with Friends and Family: Twice a week    Attends Religious Services: Never    Marine scientist or Organizations: No    Attends Archivist Meetings: Never    Marital Status: Married  Human resources officer Violence: Not At Risk (06/16/2022)   Humiliation, Afraid, Rape, and Kick questionnaire    Fear of Current or Ex-Partner: No    Emotionally Abused: No    Physically Abused: No    Sexually Abused: No    Past Surgical History:  Procedure Laterality Date   ABDOMINAL EXPOSURE N/A 07/26/2017   Procedure: ABDOMINAL EXPOSURE;  Surgeon: Deitra Mayo  S, MD;  Location: [place] [facility];  Service: Vascular;  Laterality: N/A;   ANTERIOR CERVICAL DECOMP/DISCECTOMY FUSION  2001   ANTERIOR LUMBAR FUSION N/A 07/26/2017   Procedure: Lumbar five-Sacral one Anterior lumbar interbody fusion with Dr. Deitra Mayo for approach;  Surgeon: Ditty, Kevan Ny, MD;  Location: [facility];  Service: Neurosurgery;  Laterality: N/A;   APPLICATION OF ROBOTIC ASSISTANCE FOR SPINAL PROCEDURE  12/19/2016   Procedure: APPLICATION OF ROBOTIC ASSISTANCE FOR SPINAL PROCEDURE;  Surgeon: Kevan Ny Ditty, MD;  Location: [facility];  Service: Neurosurgery;;   APPLICATION OF ROBOTIC ASSISTANCE FOR SPINAL PROCEDURE N/A 07/26/2017   Procedure: APPLICATION OF ROBOTIC ASSISTANCE FOR SPINAL PROCEDURE;  Surgeon: Ditty, Kevan Ny, MD;  Location: [place];  Service: Neurosurgery;  Laterality: N/A;   APPLICATION OF ROBOTIC ASSISTANCE FOR SPINAL PROCEDURE N/A 09/02/2017   Procedure: APPLICATION OF ROBOTIC ASSISTANCE FOR SPINAL PROCEDURE;  Surgeon: Ditty, Kevan Ny, MD;  Location: [facility] [place];  Service: Neurosurgery;  Laterality: N/A;   BACK SURGERY     BLADDER SUSPENSION  1991   tack   BRONCHIAL BIOPSY   09/15/2021   Procedure: BRONCHIAL BIOPSIES;  Surgeon: Garner Nash, DO;  Location: [facility];  Service: Pulmonary;;   BRONCHIAL BIOPSY  05/04/2022   Procedure: BRONCHIAL BIOPSIES;  Surgeon: Garner Nash, DO;  Location: [facility];  Service: Pulmonary;;   BRONCHIAL BRUSHINGS  09/15/2021   Procedure: BRONCHIAL BRUSHINGS;  Surgeon: Garner Nash, DO;  Location: [place];  Service: Pulmonary;;   BRONCHIAL NEEDLE ASPIRATION BIOPSY  09/15/2021   Procedure: BRONCHIAL NEEDLE ASPIRATION BIOPSIES;  Surgeon: Garner Nash, DO;  Location: [facility];  Service: Pulmonary;;   BRONCHIAL NEEDLE ASPIRATION BIOPSY  05/04/2022   Procedure: BRONCHIAL NEEDLE ASPIRATION BIOPSIES;  Surgeon: Garner Nash, DO;  Location: [facility];  Service: Pulmonary;;   BRONCHIAL WASHINGS  09/15/2021   Procedure: BRONCHIAL WASHINGS;  Surgeon: Garner Nash, DO;  Location: [facility];  Service: Pulmonary;;   FIDUCIAL MARKER PLACEMENT  09/15/2021   Procedure: FIDUCIAL MARKER PLACEMENT;  Surgeon: Garner Nash, DO;  Location: [facility];  Service: Pulmonary;;   INGUINAL HERNIA REPAIR Right 1991   INTERCOSTAL NERVE BLOCK Left 06/16/2022   Procedure: INTERCOSTAL NERVE BLOCK;  Surgeon: [name], MD;  Location: [facility];  Service: Thoracic;  Laterality: Left;   LUMBAR FUSION  2016; 2017; 07/26/2017   L4-5; L2-3; L5-S1   LYMPH NODE DISSECTION Left 06/16/2022   Procedure: LYMPH NODE DISSECTION;  Surgeon: [name], MD;  Location: [facility];  Service: Thoracic;  Laterality: Left;   POSTERIOR LUMBAR FUSION 4 WITH HARDWARE REMOVAL N/A 09/02/2017   Procedure: Lumbar three-four redo laminectomy; Repositioning of Left Sacral two screw; Extension of lumbar fusion to Thoracic twelve;  Surgeon: Ditty, Kevan Ny, MD;  Location: [facility];  Service: Neurosurgery;  Laterality: N/A;   TONSILLECTOMY AND ADENOIDECTOMY  1959   TUMOR EXCISION     WERTHIN'S TUMORS BOTH SIDES OF NECK   VAGINAL HYSTERECTOMY  1979    VIDEO BRONCHOSCOPY WITH RADIAL ENDOBRONCHIAL ULTRASOUND  09/15/2021   Procedure: VIDEO BRONCHOSCOPY WITH RADIAL ENDOBRONCHIAL ULTRASOUND;  Surgeon: Garner Nash, DO;  Location: [place] [facility];  Service: Pulmonary;;   VIDEO BRONCHOSCOPY WITH RADIAL ENDOBRONCHIAL ULTRASOUND  05/04/2022   Procedure: VIDEO BRONCHOSCOPY WITH RADIAL ENDOBRONCHIAL ULTRASOUND;  Surgeon: Garner Nash, DO;  Location: [facility];  Service: Pulmonary;;    Family History  Problem Relation Age of Onset   Diabetes Mother    Dementia Mother  Cirrhosis Mother        Non alcoholic   COPD Father    Diabetes Brother     Allergies  Allergen Reactions   Shellfish Allergy Anaphylaxis, Swelling and Other (See Comments)    Tongue swells   Oxycodone Nausea Only   Penicillins Rash and Other (See Comments)    Has patient had a PCN reaction causing immediate rash, facial/tongue/throat swelling, SOB or lightheadedness with hypotension: Yes Has patient had a PCN reaction causing severe rash involving mucus membranes or skin necrosis: No Has patient had a PCN reaction that required hospitalization No Has patient had a PCN reaction occurring within the last 10 years: No If all of the above answers are "NO", then may proceed with Cephalosporin use.    Current Outpatient Medications on File Prior to Visit  Medication Sig Dispense Refill   atorvastatin (LIPITOR) 40 MG tablet Take 1 tablet (40 mg total) by mouth daily. For cholesterol. 90 tablet 3   baclofen (LIORESAL) 10 MG tablet Take 1 tablet (10 mg total) by mouth 2 (two) times daily.     Biotin 5000 MCG CAPS Take 5,000 mcg by mouth every morning.      cetirizine (ZYRTEC) 10 MG tablet Take 1 tablet (10 mg total) by mouth daily. For allergies 90 tablet 0   Cholecalciferol (VITAMIN D) 50 MCG (2000 UT) tablet Take 2,000 Units by mouth daily.     Coenzyme Q10 (COQ10) 100 MG CAPS Take 100 mg by mouth every evening.     esomeprazole (NEXIUM 24HR) 20 MG capsule Take 1 capsule  (20 mg total) by mouth daily at 12 noon. 90 capsule 1   fluocinonide cream (LIDEX) 5.42 % Apply 1 Application topically 2 (two) times daily as needed (irritation).     gabapentin (NEURONTIN) 300 MG capsule Take 1 capsule (300 mg total) by mouth 2 (two) times daily. For pain. 180 capsule 3   ibuprofen (ADVIL) 200 MG tablet Take 600 mg by mouth every 8 (eight) hours as needed (pain).     lisinopril (ZESTRIL) 20 MG tablet TAKE 1 TABLET(20 MG) BY MOUTH DAILY FOR BLOOD PRESSURE 90 tablet 1   metFORMIN (GLUCOPHAGE) 500 MG tablet TAKE 1 TABLET BY MOUTH TWICE DAILY FOR DIABETES 180 tablet 1   nystatin-triamcinolone ointment (MYCOLOG) Apply 1 Application topically 2 (two) times daily as needed (irritation).     oxybutynin (DITROPAN-XL) 5 MG 24 hr tablet TAKE 1 TABLET(5 MG) BY MOUTH AT BEDTIME FOR OVERACTIVE BLADDER 90 tablet 0   Polyethyl Glycol-Propyl Glycol (LUBRICANT EYE DROPS) 0.4-0.3 % SOLN Place 1-2 drops into both eyes 2 (two) times daily.     senna-docusate (SENOKOT-S) 8.6-50 MG tablet Take 2 tablets by mouth 2 (two) times daily. (Patient taking differently: Take 1 tablet by mouth every other day.)     oxybutynin (OXYTROL) 3.9 MG/24HR Place 1 patch onto the skin See admin instructions. Change every 5 days (Patient not taking: Reported on 06/29/2022)     No current facility-administered medications on file prior to visit.    BP (!) 164/87   Pulse 67   Temp (!) 97.3 F (36.3 C) (Temporal)   SpO2 98%  Objective:   Physical Exam Cardiovascular:     Rate and Rhythm: Normal rate and regular rhythm.  Pulmonary:     Effort: Pulmonary effort is normal.     Breath sounds: Normal breath sounds.  Abdominal:     Palpations: Abdomen is soft.     Tenderness: There is no abdominal tenderness.  Musculoskeletal:     Cervical back: Neck supple.  Skin:    General: Skin is warm and dry.  Neurological:     Mental Status: She is alert and oriented to person, place, and time.  Psychiatric:        Mood  and Affect: Mood normal.           Assessment & Plan:   Problem List Items Addressed This Visit       Cardiovascular and Mediastinum   Essential hypertension - Primary    Above goal today, also with home readings.   Increase amlodipine to 10 mg daily.  Continue lisinopril 20 mg daily. She will continue to monitor BP at home and report if readings remain at or above 140/90 or drop below 100/60.  CMP reviewed from hospital visit in October 2023      Relevant Medications   amLODipine (NORVASC) 10 MG tablet     Endocrine   Type 2 diabetes mellitus with hyperglycemia ([place])    Reviewed A1c from September 2023.  Continue metformin 500 mg twice daily. Urine microalbumin due and pending. Managed on statin therapy. Pneumonia vaccine up-to-date. Eye and foot exam up-to-date.  Repeat A1c in December 2023, follow-up in 6 months.      Relevant Orders   Microalbumin/Creatinine Ratio, Urine   Hemoglobin A1c     Nervous and Auditory   Lumbosacral spondylosis with radiculopathy    Stable and controlled.  Continue gabapentin 300 mg twice daily, baclofen 10 mg twice daily. Remain off of Celebrex.        Genitourinary   Overactive bladder    Overall controlled.  Continue oxybutynin XL 5 mg daily, oxybutynin 3.9 mg patches every 5 days.        Other   Hyperlipidemia    Continue atorvastatin 40 mg daily. Repeat lipid panel due in December 2023.      Relevant Medications   amLODipine (NORVASC) 10 MG tablet   Other Relevant Orders   Lipid panel   Neuropathic pain    Controlled.  Continue gabapentin 300 mg twice daily.      Muscle spasms of both lower extremities    Controlled.  Continue baclofen 10 mg twice daily.      S/P lobectomy of lung    Reviewed hospital notes, labs, imaging from October 2023. Continue incentive spirometer. Follow-up with surgeon as scheduled.  Lung sounds today clear and equal.      Urinary frequency    UA and urine culture  due and pending. Await results prior to antibiotic treatment if warranted.      Relevant Orders   POCT Urinalysis Dipstick (Automated)   Urine Culture       Pleas Koch, NP

## 2022-06-29 NOTE — Assessment & Plan Note (Signed)
Controlled.  Continue baclofen 10 mg twice daily.

## 2022-06-29 NOTE — Assessment & Plan Note (Signed)
Controlled.  Continue gabapentin 300 mg twice daily.

## 2022-06-29 NOTE — Assessment & Plan Note (Signed)
Reviewed A1c from September 2023.  Continue metformin 500 mg twice daily. Urine microalbumin due and pending. Managed on statin therapy. Pneumonia vaccine up-to-date. Eye and foot exam up-to-date.  Repeat A1c in December 2023, follow-up in 6 months.

## 2022-06-29 NOTE — Assessment & Plan Note (Signed)
Stable and controlled.  Continue gabapentin 300 mg twice daily, baclofen 10 mg twice daily. Remain off of Celebrex.

## 2022-06-29 NOTE — Assessment & Plan Note (Signed)
Reviewed hospital notes, labs, imaging from October 2023. Continue incentive spirometer. Follow-up with surgeon as scheduled.  Lung sounds today clear and equal.

## 2022-06-29 NOTE — Assessment & Plan Note (Signed)
UA and urine culture due and pending. Await results prior to antibiotic treatment if warranted.

## 2022-06-29 NOTE — Assessment & Plan Note (Signed)
Above goal today, also with home readings.   Increase amlodipine to 10 mg daily.  Continue lisinopril 20 mg daily. She will continue to monitor BP at home and report if readings remain at or above 140/90 or drop below 100/60.  CMP reviewed from hospital visit in October 2023

## 2022-06-29 NOTE — Assessment & Plan Note (Signed)
Continue atorvastatin 40 mg daily. Repeat lipid panel due in December 2023.

## 2022-07-01 ENCOUNTER — Other Ambulatory Visit: Payer: Self-pay | Admitting: *Deleted

## 2022-07-01 ENCOUNTER — Ambulatory Visit (INDEPENDENT_AMBULATORY_CARE_PROVIDER_SITE_OTHER): Payer: PPO

## 2022-07-01 ENCOUNTER — Ambulatory Visit (INDEPENDENT_AMBULATORY_CARE_PROVIDER_SITE_OTHER): Payer: Self-pay | Admitting: Thoracic Surgery (Cardiothoracic Vascular Surgery)

## 2022-07-01 VITALS — BP 145/78 | HR 69 | Resp 20 | Ht 68.0 in | Wt 170.0 lb

## 2022-07-01 DIAGNOSIS — Z23 Encounter for immunization: Secondary | ICD-10-CM

## 2022-07-01 DIAGNOSIS — Z902 Acquired absence of lung [part of]: Secondary | ICD-10-CM

## 2022-07-01 NOTE — Progress Notes (Signed)
The proposed treatment discussed in conference is for discussion purpose only and is not a binding recommendation.  The patients have not been physically examined, or presented with their treatment options.  Therefore, final treatment plans cannot be decided.  

## 2022-07-02 ENCOUNTER — Telehealth: Payer: Self-pay | Admitting: Internal Medicine

## 2022-07-02 ENCOUNTER — Other Ambulatory Visit: Payer: Self-pay | Admitting: Primary Care

## 2022-07-02 DIAGNOSIS — N3000 Acute cystitis without hematuria: Secondary | ICD-10-CM

## 2022-07-02 LAB — URINE CULTURE
MICRO NUMBER:: 14061858
SPECIMEN QUALITY:: ADEQUATE

## 2022-07-02 MED ORDER — NITROFURANTOIN MONOHYD MACRO 100 MG PO CAPS: 100.0000 mg | ORAL_CAPSULE | Freq: Two times a day (BID) | ORAL | 0 refills | Status: AC

## 2022-07-02 NOTE — Telephone Encounter (Signed)
Scheduled appt per 10/19 referral. Pt is aware of appt date and time. Pt is aware to arrive 15 mins prior to appt time and to bring and updated insurance card. Pt is aware of appt location.

## 2022-07-08 ENCOUNTER — Encounter (HOSPITAL_COMMUNITY): Payer: Self-pay

## 2022-07-08 DIAGNOSIS — C3492 Malignant neoplasm of unspecified part of left bronchus or lung: Secondary | ICD-10-CM | POA: Diagnosis not present

## 2022-07-13 ENCOUNTER — Encounter (HOSPITAL_COMMUNITY): Payer: Self-pay

## 2022-07-19 ENCOUNTER — Other Ambulatory Visit: Payer: Self-pay | Admitting: Surgical

## 2022-07-20 ENCOUNTER — Other Ambulatory Visit: Payer: Self-pay | Admitting: Medical Oncology

## 2022-07-20 ENCOUNTER — Inpatient Hospital Stay: Payer: PPO

## 2022-07-20 ENCOUNTER — Inpatient Hospital Stay: Payer: PPO | Attending: Internal Medicine | Admitting: Internal Medicine

## 2022-07-20 VITALS — BP 146/78 | HR 88 | Temp 98.4°F | Resp 15 | Ht 68.0 in | Wt 165.7 lb

## 2022-07-20 DIAGNOSIS — Z87891 Personal history of nicotine dependence: Secondary | ICD-10-CM

## 2022-07-20 DIAGNOSIS — E119 Type 2 diabetes mellitus without complications: Secondary | ICD-10-CM

## 2022-07-20 DIAGNOSIS — C3432 Malignant neoplasm of lower lobe, left bronchus or lung: Secondary | ICD-10-CM

## 2022-07-20 DIAGNOSIS — G629 Polyneuropathy, unspecified: Secondary | ICD-10-CM | POA: Diagnosis not present

## 2022-07-20 DIAGNOSIS — Z5111 Encounter for antineoplastic chemotherapy: Secondary | ICD-10-CM | POA: Diagnosis not present

## 2022-07-20 DIAGNOSIS — C801 Malignant (primary) neoplasm, unspecified: Secondary | ICD-10-CM

## 2022-07-20 DIAGNOSIS — Z79899 Other long term (current) drug therapy: Secondary | ICD-10-CM | POA: Diagnosis not present

## 2022-07-20 LAB — CBC WITH DIFFERENTIAL (CANCER CENTER ONLY)
Abs Immature Granulocytes: 0.01 10*3/uL (ref 0.00–0.07)
Basophils Absolute: 0 10*3/uL (ref 0.0–0.1)
Basophils Relative: 0 %
Eosinophils Absolute: 0.1 10*3/uL (ref 0.0–0.5)
Eosinophils Relative: 3 %
HCT: 36.7 % (ref 36.0–46.0)
Hemoglobin: 12.4 g/dL (ref 12.0–15.0)
Immature Granulocytes: 0 %
Lymphocytes Relative: 32 %
Lymphs Abs: 1.6 10*3/uL (ref 0.7–4.0)
MCH: 29.4 pg (ref 26.0–34.0)
MCHC: 33.8 g/dL (ref 30.0–36.0)
MCV: 87 fL (ref 80.0–100.0)
Monocytes Absolute: 0.5 10*3/uL (ref 0.1–1.0)
Monocytes Relative: 10 %
Neutro Abs: 2.8 10*3/uL (ref 1.7–7.7)
Neutrophils Relative %: 55 %
Platelet Count: 222 10*3/uL (ref 150–400)
RBC: 4.22 MIL/uL (ref 3.87–5.11)
RDW: 13.9 % (ref 11.5–15.5)
WBC Count: 5 10*3/uL (ref 4.0–10.5)
nRBC: 0 % (ref 0.0–0.2)

## 2022-07-20 LAB — CMP (CANCER CENTER ONLY)
ALT: 14 U/L (ref 0–44)
AST: 13 U/L — ABNORMAL LOW (ref 15–41)
Albumin: 3.9 g/dL (ref 3.5–5.0)
Alkaline Phosphatase: 90 U/L (ref 38–126)
Anion gap: 11 (ref 5–15)
BUN: 12 mg/dL (ref 8–23)
CO2: 25 mmol/L (ref 22–32)
Calcium: 8.9 mg/dL (ref 8.9–10.3)
Chloride: 103 mmol/L (ref 98–111)
Creatinine: 0.97 mg/dL (ref 0.44–1.00)
GFR, Estimated: >60 mL/min (ref >60)
Glucose, Bld: 187 mg/dL — ABNORMAL HIGH (ref 70–99)
Potassium: 4.3 mmol/L (ref 3.5–5.1)
Sodium: 139 mmol/L (ref 135–145)
Total Bilirubin: 0.4 mg/dL (ref 0.3–1.2)
Total Protein: 6.8 g/dL (ref 6.5–8.1)

## 2022-07-20 MED ORDER — CYANOCOBALAMIN 1000 MCG/ML IJ SOLN
1000.0000 ug | Freq: Once | INTRAMUSCULAR | Status: AC
  Administered 2022-07-20: 1000 ug via INTRAMUSCULAR
  Filled 2022-07-20: qty 1

## 2022-07-20 MED ORDER — CYANOCOBALAMIN 1000 MCG/ML IJ SOLN: 1000.0000 ug | Freq: Once | INTRAMUSCULAR | Status: DC

## 2022-07-20 MED ORDER — DEXAMETHASONE 4 MG PO TABS: ORAL_TABLET | ORAL | 0 refills | Status: DC

## 2022-07-20 MED ORDER — PROCHLORPERAZINE MALEATE 10 MG PO TABS: 10.0000 mg | ORAL_TABLET | Freq: Four times a day (QID) | ORAL | 0 refills | Status: DC | PRN

## 2022-07-20 MED ORDER — FOLIC ACID 1 MG PO TABS: 1.0000 mg | ORAL_TABLET | Freq: Every day | ORAL | 4 refills | Status: DC

## 2022-07-20 NOTE — Patient Instructions (Signed)
Thank you for choosing [facility] [place] to provide your care.   Should you have questions after your visit to the [facility]), please contact this office at [phone] between 8:30 AM and 4:30 PM.  Voice mails left after 4:00 PM may not be returned until the following business day.  Calls received after 4:30 PM will be answered by an off-site Nurse Triage Line.    Prescription Refills:  Please have your pharmacy contact us directly for most prescription requests.  Contact the office directly for refills of narcotics (pain medications). Allow 48-72 hours for refills.  Appointments: Please contact the [facility] scheduling department [phone] for questions regarding [facility] appointment scheduling.  Contact the schedulers with any scheduling changes so that your appointment can be rescheduled in a timely manner.   Central Scheduling for [facility] [place] [phone] - Call to schedule PET scan, CT scan, MRI, and Ultrasound.  To afford each patient quality time with our providers, please arrive 30 minutes before your scheduled appointment time.  If you arrive late for your appointment, you may be asked to reschedule.  We strive to give you quality time with our providers, and arriving late affects you and other patients whose appointments are after yours. If you are a no show for multiple scheduled visits, you may be dismissed from the clinic at the providers discretion.     Resources: [place] [employer] [phone] for additional information on assistance programs or assistance connecting with community support programs   [place]  ([phone]: Information regarding food stamps, Medicaid, and utility assistance [employer]'s shared-ride transportation service for eligible riders who have a disability that prevents them from riding the fixed route bus.   [employer] [phone] Helps people with  Medicare understand their rights and benefits, navigate the Medicare system, and secure the quality healthcare they deserve [employer] ([phone] Assists patients locate various types of support and financial assistance Cancer Care: 1-800-813-HOPE 209-199-1100) Provides financial assistance, online support groups, medication/co-pay assistance.   Transportation Assistance for appointments at [place] or provider support staff for referral to [place]   Again, thank you for choosing [facility] for your care.

## 2022-07-20 NOTE — Progress Notes (Signed)
[place] Telephone:([phone]) [phone]   Fax:([phone]  CONSULT NOTE  REFERRING PHYSICIAN: Dr. Melodie Bouillon.  REASON FOR CONSULTATION:  71 years old white female recently diagnosed with lung cancer.  HPI Megan [name] is a 71 y.o. female with past medical history significant for multiple medical problems including osteoarthritis, COPD, GERD, hypertension, distal anemia, history of uterine cancer in 1979 status post hysterectomy, diabetes mellitus and history for smoking but she quit in July 2007.  The patient was seen by her primary care provider who advised her to have CT screening of the chest because of her smoking history.  She had CT scan of the chest on September 15, 2020 that showed suspicious 1.38 cm medial left lower lobe nodular opacity that has been present since June 03, 2017 and likely scarring.  This was monitored closely and repeat CT scan of the chest on July 01, 2021 showed the same suspicious pulmonary nodule stable in size.  She had a PET scan on July 20, 2021 and that showed low-level FDG uptake in the medial left lower lobe lesion of concern but still suspicious for low-grade neoplasm.  There was no other areas of unexpected or suspicious hypermetabolism on the exam.  On September 15, 2021 the patient underwent bronchoscopy with navigation evaluation under the care of Dr. Valeta Harms and the final pathology was negative for malignancy. The patient then had repeat CT super D of the chest on March 22, 2022 and it showed masslike subsolid lesion in the medial left lower lobe measuring 6.1 x 3.8 cm with a 3.5 cm solid component increased in size since July 20, 2021 CT scan of the chest and worrisome for primary bronchogenic carcinoma.  There was no thoracic adenopathy or other findings suspicious for metastatic disease in the chest.  Another PET scan was performed on April 09, 2022 and that showed increased size and FDG avidity of the now hypermetabolic  subsolid left lower lobe pulmonary mass highly suspicious for primary bronchogenic carcinoma.  There was no hypermetabolic thoracic adenopathy or evidence of hypermetabolic distant metastatic disease.  On May 04, 2022 the patient underwent video bronchoscopy with robotic assisted bronchoscopic navigation under the care of Dr. Kipp Brood and the final pathology [id number] ) showed malignant cells consistent with adenocarcinoma.  The molecular studies by foundation 1 showed no actionable mutations and PD-L1 expression was negative. The patient was referred to Dr. Kipp Brood and on June 13, 2022 she underwent robotic assisted left video thoracoscopy with left lower lobectomy and mediastinal lymph node sampling.  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  The tumor measured 5.5 x 4.2 x 2.2 cm with invasion of the visceral pleura but no evidence for lymphovascular invasion.  The sampled lymph nodes were negative for malignancy. The patient was referred to me today for evaluation and recommendation regarding treatment of her condition. When seen today she continues to have mild soreness on the left side of the chest but no significant cough, shortness of breath or hemoptysis.  She continues to have constipation which has been going on for a while after her back surgery.  She denied having any nausea, vomiting, abdominal pain or diarrhea.  She has no headache or visual changes. Family history significant for father with COPD, mother had diabetes mellitus. The patient is married and has 1 daughter.  She used to work as a Engineer, mining.  She was accompanied today by her  husband [name].  She has a history of smoking 1.5 pack/day for around 38 years and quit in July 2007.  She drinks alcohol socially and no history of drug abuse.  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   Constipation due to pain medication    Debility    Depression    Dyspnea    W/ PHYS CONDITION    Emphysema of lung ([place])    Foul smelling urine 07/22/2020   GERD (gastroesophageal reflux disease)    History of bronchitis    "not since I quit smoking" (07/26/2017)   History of kidney stones    History of shingles    Hyperlipidemia    takes Fish Oil daily   Hypertension    Hypoalbuminemia due to protein-calorie malnutrition ([place])    L3 vertebral fracture ([place]) 09/01/2017   Lung cancer ([place]) 2023   Myelopathy ([place]) 12/21/2016   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   Neuromuscular disorder (H[facility])    tingling toes   Paraparesis ([place]) 12/15/2016   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   Pneumonia 2009   PONV (postoperative nausea and vomiting)    Radiculopathy 07/28/2017   Restless leg    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 Deitra Mayo for approach;  Surgeon: Ditty, Kevan Ny, MD;  Location: [facility];  Service: Neurosurgery;  Laterality: N/A;   APPLICATION OF ROBOTIC ASSISTANCE FOR SPINAL PROCEDURE  12/19/2016   Procedure: APPLICATION OF ROBOTIC ASSISTANCE FOR SPINAL PROCEDURE;  Surgeon:  Kevan Ny Ditty, MD;  Location: [place];  Service: Neurosurgery;;   APPLICATION OF ROBOTIC ASSISTANCE FOR SPINAL PROCEDURE N/A 07/26/2017   Procedure: APPLICATION OF ROBOTIC ASSISTANCE FOR SPINAL PROCEDURE;  Surgeon: Ditty, Kevan Ny, MD;  Location: [facility];  Service: Neurosurgery;  Laterality: N/A;   APPLICATION OF ROBOTIC ASSISTANCE FOR SPINAL PROCEDURE N/A 09/02/2017   Procedure: APPLICATION OF ROBOTIC ASSISTANCE FOR SPINAL PROCEDURE;  Surgeon: Ditty, Kevan Ny, MD;  Location: [place];  Service: Neurosurgery;  Laterality: N/A;   BACK SURGERY     BLADDER SUSPENSION  1991   tack   BRONCHIAL BIOPSY  09/15/2021   Procedure: BRONCHIAL BIOPSIES;  Surgeon: Garner Nash, DO;  Location: [facility];  Service: Pulmonary;;   BRONCHIAL BIOPSY  05/04/2022   Procedure: BRONCHIAL BIOPSIES;  Surgeon: Garner Nash, DO;  Location: [facility];  Service: Pulmonary;;   BRONCHIAL BRUSHINGS  09/15/2021   Procedure: BRONCHIAL BRUSHINGS;  Surgeon: Garner Nash, DO;  Location: [facility];  Service: Pulmonary;;   BRONCHIAL NEEDLE ASPIRATION BIOPSY  09/15/2021   Procedure: BRONCHIAL NEEDLE ASPIRATION BIOPSIES;  Surgeon: Garner Nash, DO;  Location: [facility];  Service: Pulmonary;;  BRONCHIAL NEEDLE ASPIRATION BIOPSY  05/04/2022   Procedure: BRONCHIAL NEEDLE ASPIRATION BIOPSIES;  Surgeon: Garner Nash, DO;  Location: [facility];  Service: Pulmonary;;   BRONCHIAL WASHINGS  09/15/2021   Procedure: BRONCHIAL WASHINGS;  Surgeon: Garner Nash, DO;  Location: [facility];  Service: Pulmonary;;   FIDUCIAL MARKER PLACEMENT  09/15/2021   Procedure: FIDUCIAL MARKER PLACEMENT;  Surgeon: Garner Nash, DO;  Location: [facility];  Service: Pulmonary;;   INGUINAL HERNIA REPAIR Right 1991   INTERCOSTAL NERVE BLOCK Left 06/16/2022   Procedure: INTERCOSTAL NERVE BLOCK;  Surgeon: [name], MD;  Location: [facility];  Service: Thoracic;  Laterality: Left;   LUMBAR FUSION  2016; 2017;  07/26/2017   L4-5; L2-3; L5-S1   LYMPH NODE DISSECTION Left 06/16/2022   Procedure: LYMPH NODE DISSECTION;  Surgeon: [name], MD;  Location: [facility];  Service: Thoracic;  Laterality: Left;   POSTERIOR LUMBAR FUSION 4 WITH HARDWARE REMOVAL N/A 09/02/2017   Procedure: Lumbar three-four redo laminectomy; Repositioning of Left Sacral two screw; Extension of lumbar fusion to Thoracic twelve;  Surgeon: Ditty, Kevan Ny, MD;  Location: [facility];  Service: Neurosurgery;  Laterality: N/A;   TONSILLECTOMY AND ADENOIDECTOMY  1959   TUMOR EXCISION     WERTHIN'S TUMORS BOTH SIDES OF NECK   VAGINAL HYSTERECTOMY  1979   VIDEO BRONCHOSCOPY WITH RADIAL ENDOBRONCHIAL ULTRASOUND  09/15/2021   Procedure: VIDEO BRONCHOSCOPY WITH RADIAL ENDOBRONCHIAL ULTRASOUND;  Surgeon: Garner Nash, DO;  [facility]: [facility];  Service: Pulmonary;;   VIDEO BRONCHOSCOPY WITH RADIAL ENDOBRONCHIAL ULTRASOUND  05/04/2022   Procedure: VIDEO BRONCHOSCOPY WITH RADIAL ENDOBRONCHIAL ULTRASOUND;  Surgeon: Garner Nash, DO;  Location: [facility];  Service: Pulmonary;;    Family History  Problem Relation Age of Onset   Diabetes Mother    Dementia Mother    Cirrhosis Mother        Non alcoholic   COPD Father    Diabetes Brother     Social History Social History   Tobacco Use   Smoking status: Former    Packs/day: 1.50    Years: 35.00    Total pack years: 52.50    Types: Cigarettes    Quit date: 05/13/2006    Years since quitting: 16.1   Smokeless tobacco: Never  Vaping Use   Vaping Use: Never used  Substance Use Topics   Alcohol use: Yes    Alcohol/week: 2.0 standard drinks of alcohol    Types: 2 Glasses of wine per week   Drug use: No    Allergies  Allergen Reactions   Shellfish Allergy Anaphylaxis, Swelling and Other (See Comments)    Tongue swells   Oxycodone Nausea Only   Penicillins Rash and Other (See Comments)    Has patient had a PCN reaction causing immediate rash,  facial/tongue/throat swelling, SOB or lightheadedness with hypotension: Yes Has patient had a PCN reaction causing severe rash involving mucus membranes or skin necrosis: No Has patient had a PCN reaction that required hospitalization No Has patient had a PCN reaction occurring within the last 10 years: No If all of the above answers are "NO", then may proceed with Cephalosporin use.    Current Outpatient Medications  Medication Sig Dispense Refill   amLODipine (NORVASC) 10 MG tablet Take 1 tablet (10 mg total) by mouth daily. For blood pressure 90 tablet 3   atorvastatin (LIPITOR) 40 MG tablet Take 1 tablet (40 mg total) by mouth daily. For cholesterol. 90 tablet 3  baclofen (LIORESAL) 10 MG tablet Take 1 tablet (10 mg total) by mouth 2 (two) times daily.     Biotin 5000 MCG CAPS Take 5,000 mcg by mouth every morning.      cetirizine (ZYRTEC) 10 MG tablet Take 1 tablet (10 mg total) by mouth daily. For allergies 90 tablet 0   Cholecalciferol (VITAMIN D) 50 MCG (2000 UT) tablet Take 2,000 Units by mouth daily.     Coenzyme Q10 (COQ10) 100 MG CAPS Take 100 mg by mouth every evening.     esomeprazole (NEXIUM 24HR) 20 MG capsule Take 1 capsule (20 mg total) by mouth daily at 12 noon. 90 capsule 1   fluocinonide cream (LIDEX) 9.15 % Apply 1 Application topically 2 (two) times daily as needed (irritation).     gabapentin (NEURONTIN) 300 MG capsule Take 1 capsule (300 mg total) by mouth 2 (two) times daily. For pain. 180 capsule 3   ibuprofen (ADVIL) 200 MG tablet Take 600 mg by mouth every 8 (eight) hours as needed (pain).     lisinopril (ZESTRIL) 20 MG tablet TAKE 1 TABLET(20 MG) BY MOUTH DAILY FOR BLOOD PRESSURE 90 tablet 1   metFORMIN (GLUCOPHAGE) 500 MG tablet TAKE 1 TABLET BY MOUTH TWICE DAILY FOR DIABETES 180 tablet 1   nystatin-triamcinolone ointment (MYCOLOG) Apply 1 Application topically 2 (two) times daily as needed (irritation).     oxybutynin (DITROPAN-XL) 5 MG 24 hr tablet TAKE 1  TABLET(5 MG) BY MOUTH AT BEDTIME FOR OVERACTIVE BLADDER 90 tablet 0   oxybutynin (OXYTROL) 3.9 MG/24HR Place 1 patch onto the skin See admin instructions. Change every 5 days     Polyethyl Glycol-Propyl Glycol (LUBRICANT EYE DROPS) 0.4-0.3 % SOLN Place 1-2 drops into both eyes 2 (two) times daily.     senna-docusate (SENOKOT-S) 8.6-50 MG tablet Take 2 tablets by mouth 2 (two) times daily. (Patient taking differently: Take 1 tablet by mouth every other day.)     No current facility-administered medications for this visit.    Review of Systems  Constitutional: negative Eyes: negative Ears, nose, mouth, throat, and face: negative Respiratory: positive for pleurisy/chest pain Cardiovascular: negative Gastrointestinal: positive for constipation Genitourinary:negative Integument/breast: negative Hematologic/lymphatic: negative Musculoskeletal:negative Neurological: negative Behavioral/Psych: negative Endocrine: negative Allergic/Immunologic: negative  Physical Exam  AVW:PVXYI, healthy, no distress, well nourished, and well developed SKIN: skin color, texture, turgor are normal, no rashes or significant lesions HEAD: Normocephalic, No masses, lesions, tenderness or abnormalities EYES: normal, PERRLA, Conjunctiva are pink and non-injected EARS: External ears normal, Canals clear OROPHARYNX:no exudate, no erythema, and lips, buccal mucosa, and tongue normal  NECK: supple, no adenopathy, no JVD LYMPH:  no palpable lymphadenopathy, no hepatosplenomegaly BREAST:not examined LUNGS: clear to auscultation , and palpation HEART: regular rate & rhythm, no murmurs, and no gallops ABDOMEN:abdomen soft, non-tender, normal bowel sounds, and no masses or organomegaly BACK: Back symmetric, no curvature., No CVA tenderness EXTREMITIES:no joint deformities, effusion, or inflammation, no edema  NEURO: alert & oriented x 3 with fluent speech, no focal motor/sensory deficits  PERFORMANCE STATUS: ECOG  1  LABORATORY DATA: Lab Results  Component Value Date   WBC 5.0 07/20/2022   HGB 12.4 07/20/2022   HCT 36.7 07/20/2022   MCV 87.0 07/20/2022   PLT 222 07/20/2022      Chemistry      Component Value Date/Time   NA 139 07/20/2022 1414   K 4.3 07/20/2022 1414   CL 103 07/20/2022 1414   CO2 25 07/20/2022 1414   BUN 12 07/20/2022  1414   CREATININE 0.97 07/20/2022 1414   CREATININE 0.84 07/31/2021 1600      Component Value Date/Time   CALCIUM 8.9 07/20/2022 1414   ALKPHOS 90 07/20/2022 1414   AST 13 (L) 07/20/2022 1414   ALT 14 07/20/2022 1414   BILITOT 0.4 07/20/2022 1414       RADIOGRAPHIC STUDIES: DG Chest 2 View  Result Date: 06/21/2022 CLINICAL DATA:  Status post left lobectomy.  Surgery follow-up. EXAM: CHEST - 2 VIEW COMPARISON:  AP chest 06/20/2022, 06/19/2022, 06/18/2022, 06/17/2022 FINDINGS: Small left apical pneumothorax appears not significantly changed from most recent 06/20/2022 at 6:55 a.m. radiograph. There is again left lower lung heterogeneous airspace opacification, likely atelectasis. Inferolateral left chest wall subcutaneous air is similar to prior, in the region of the prior left chest tube approach. Right lung remains clear. No pleural effusion or pneumothorax. Moderate multilevel degenerative disc changes of the thoracic spine. Partial visualization of thoracolumbar posterior fusion hardware. Lower cervical spine ACDF hardware. IMPRESSION: 1. Interval removal of left-sided chest tube. Small left apical pneumothorax appears not significantly changed from most recent radiographs 06/20/2022 at 6:55 AM. 2. Unchanged left lower lung heterogeneous airspace opacification, likely atelectasis. Electronically Signed   By: Yvonne Kendall M.D.   On: 06/21/2022 06:40    ASSESSMENT: This is a very pleasant 71 years old white female diagnosed with stage IIb (T3, N0, M0) non-small cell lung cancer, adenocarcinoma presented with large left lower lobe lung mass diagnosed in  August 2023 status post left lower lobectomy with lymph node sampling under the care of Dr. Kipp Brood on June 16, 2022 with tumor size of 5.5 cm and visceropleural involvement. Molecular studies by foundation 1 showed no actionable mutation and she has negative PD-L1 expression.  PLAN: I had a lengthy discussion with the patient and her husband today about her current disease stage, prognosis and treatment options. I personally and independently reviewed the previous imaging studies as well as the pathology reports. I explained to the patient that she had a curable treatment for her condition with the surgical resection.  I also explained to the patient that there is a 10% absolute 5-year survival for treatment with adjuvant systemic chemotherapy for patient with a stage IIb non-small cell lung cancer but also observation and close monitoring is an option. The patient is interested in treatment and I would consider her for 4 cycles of systemic chemotherapy with carboplatin for AUC of 5 and Alimta 500 Mg/M2 every 3 weeks. The patient will not be a good candidate for treatment with cisplatin because of the peripheral neuropathy and diabetes mellitus. I discussed with her the adverse effect of this treatment including but not limited to alopecia, myelosuppression, nausea and vomiting, peripheral neuropathy, liver or renal dysfunction. I will arrange for the patient to receive vitamin B12 injection today. She will have a chemotherapy education class before the first dose of treatment. I will call her pharmacy with prescription for Compazine 10 mg p.o. every 6 hours as needed for nausea in addition to folic acid 1 mg p.o. daily and Decadron 4 mg p.o. twice daily the day before, day of and day after chemotherapy every 3 weeks. She is expected to start the first cycle of this treatment on August 09, 2022. The patient will come back for follow-up visit 1 week after the start of her treatment for management  of any adverse effect of her chemotherapy. She was advised to call immediately if she has any other concerning symptoms in the interval.  The patient voices understanding of current disease status and treatment options and is in agreement with the current care plan.  All questions were answered. The patient knows to call the clinic with any problems, questions or concerns. We can certainly see the patient much sooner if necessary.  Thank you so much for allowing me to participate in the care of Megan [name]. I will continue to follow up the patient with you and assist in her care.  The total time spent in the appointment was 90 minutes.  Disclaimer: This note was dictated with voice recognition software. Similar sounding words can inadvertently be transcribed and may not be corrected upon review.   Eilleen Kempf July 20, 2022, 2:59 PM

## 2022-07-20 NOTE — Patient Instructions (Signed)

## 2022-07-20 NOTE — Progress Notes (Signed)
START ON PATHWAY REGIMEN - Non-Small Cell Lung     A cycle is every 21 days:     Pemetrexed      Carboplatin   **Always confirm dose/schedule in your pharmacy ordering system**  Patient Characteristics: Postoperative without Neoadjuvant Therapy (Pathologic Staging), Stage II-III, Adjuvant Chemotherapy, Stage IIB, Nonsquamous Cell Therapeutic Status: Postoperative without Neoadjuvant Therapy (Pathologic Staging) AJCC T Category: pT3 AJCC N Category: pN0 AJCC M Category: cM0 AJCC 8 Stage Grouping: IIB Adjuvant Chemotherapy Status: Adjuvant Chemotherapy ALK Rearrangement Status: Negative EGFR Mutation Status: Negative/Wild Type Histology: Nonsquamous Cell Intent of Therapy: Curative Intent, Discussed with Patient

## 2022-07-22 ENCOUNTER — Other Ambulatory Visit: Payer: Self-pay

## 2022-07-26 ENCOUNTER — Telehealth: Payer: Self-pay | Admitting: Internal Medicine

## 2022-07-26 NOTE — Telephone Encounter (Signed)
Called patient regarding upcoming scheduled appointments, informed patient regarding chemo education appointment and first start date of treatment. Patient is notified, patient was instructed to receive calendar her next visit.

## 2022-07-29 ENCOUNTER — Other Ambulatory Visit: Payer: Self-pay | Admitting: Thoracic Surgery (Cardiothoracic Vascular Surgery)

## 2022-07-29 DIAGNOSIS — C3490 Malignant neoplasm of unspecified part of unspecified bronchus or lung: Secondary | ICD-10-CM

## 2022-07-30 ENCOUNTER — Ambulatory Visit
Admission: RE | Admit: 2022-07-30 | Discharge: 2022-07-30 | Disposition: A | Discharge status: Home / Self Care | Payer: PPO | Source: Ambulatory Visit | Attending: Thoracic Surgery (Cardiothoracic Vascular Surgery) | Admitting: Thoracic Surgery (Cardiothoracic Vascular Surgery)

## 2022-07-30 ENCOUNTER — Ambulatory Visit (INDEPENDENT_AMBULATORY_CARE_PROVIDER_SITE_OTHER): Payer: Self-pay | Admitting: Thoracic Surgery (Cardiothoracic Vascular Surgery)

## 2022-07-30 VITALS — BP 122/69 | HR 84 | Resp 18 | Ht 68.0 in

## 2022-07-30 DIAGNOSIS — C3490 Malignant neoplasm of unspecified part of unspecified bronchus or lung: Secondary | ICD-10-CM

## 2022-07-30 DIAGNOSIS — Z902 Acquired absence of lung [part of]: Secondary | ICD-10-CM

## 2022-07-30 DIAGNOSIS — J9811 Atelectasis: Secondary | ICD-10-CM | POA: Diagnosis not present

## 2022-07-30 DIAGNOSIS — R911 Solitary pulmonary nodule: Secondary | ICD-10-CM

## 2022-07-30 DIAGNOSIS — J90 Pleural effusion, not elsewhere classified: Secondary | ICD-10-CM | POA: Diagnosis not present

## 2022-07-30 DIAGNOSIS — M5134 Other intervertebral disc degeneration, thoracic region: Secondary | ICD-10-CM | POA: Diagnosis not present

## 2022-07-30 NOTE — Progress Notes (Signed)
Pharmacist Chemotherapy Monitoring - Initial Assessment    Anticipated start date: 08/09/22   The following has been reviewed per standard work regarding the patient's treatment regimen: The patient's diagnosis, treatment plan and drug doses, and organ/hematologic function Lab orders and baseline tests specific to treatment regimen  The treatment plan start date, drug sequencing, and pre-medications Prior authorization status  Patient's documented medication list, including drug-drug interaction screen and prescriptions for anti-emetics and supportive care specific to the treatment regimen The drug concentrations, fluid compatibility, administration routes, and timing of the medications to be used The patient's access for treatment and lifetime cumulative dose history, if applicable  The patient's medication allergies and previous infusion related reactions, if applicable   Changes made to treatment plan:  N/A  Follow up needed:  N/A   Larene Beach, RPH, 07/30/2022  2:52 PM

## 2022-07-30 NOTE — Progress Notes (Signed)
[address]       [place] [address]             [phone]        Megan [name] Medical Record #[id number] Date of Birth: January 03, 1951  Referring: Garner Nash, DO Primary Care: Pleas Koch, NP Primary Cardiologist:None  Reason for visit:   follow-up  History of Present Illness:     71 year old female presents for her 1 month follow-up appointment.  Overall she is doing well.  She has some slight pain along her incisions.  She denies any shortness of breath.  She has met with medical oncology and is scheduled to start her adjuvant therapy soon.  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  She is scheduled to start her adjuvant therapy.  She will follow-up as needed.   [name] 07/30/2022 3:33 PM

## 2022-08-01 ENCOUNTER — Other Ambulatory Visit: Payer: Self-pay | Admitting: Primary Care

## 2022-08-01 DIAGNOSIS — I10 Essential (primary) hypertension: Secondary | ICD-10-CM

## 2022-08-02 ENCOUNTER — Inpatient Hospital Stay: Payer: PPO

## 2022-08-02 ENCOUNTER — Ambulatory Visit: Payer: PPO | Admitting: Primary Care

## 2022-08-02 ENCOUNTER — Other Ambulatory Visit: Payer: Self-pay

## 2022-08-03 ENCOUNTER — Ambulatory Visit: Payer: PPO | Admitting: Primary Care

## 2022-08-06 MED FILL — Fosaprepitant Dimeglumine For IV Infusion 150 MG (Base Eq): INTRAVENOUS | Qty: 5 | Status: AC

## 2022-08-06 MED FILL — Dexamethasone Sodium Phosphate Inj 100 MG/10ML: INTRAMUSCULAR | Qty: 1 | Status: AC

## 2022-08-09 ENCOUNTER — Inpatient Hospital Stay: Payer: PPO

## 2022-08-09 ENCOUNTER — Encounter: Payer: Self-pay | Admitting: Internal Medicine

## 2022-08-09 ENCOUNTER — Encounter: Payer: Self-pay | Admitting: Physician Assistant

## 2022-08-09 VITALS — BP 136/75 | HR 79 | Temp 98.2°F | Resp 17 | Wt 168.0 lb

## 2022-08-09 DIAGNOSIS — C3432 Malignant neoplasm of lower lobe, left bronchus or lung: Secondary | ICD-10-CM

## 2022-08-09 DIAGNOSIS — Z5111 Encounter for antineoplastic chemotherapy: Secondary | ICD-10-CM | POA: Diagnosis not present

## 2022-08-09 LAB — CBC WITH DIFFERENTIAL (CANCER CENTER ONLY)
Abs Immature Granulocytes: 0.03 10*3/uL (ref 0.00–0.07)
Basophils Absolute: 0 10*3/uL (ref 0.0–0.1)
Basophils Relative: 0 %
Eosinophils Absolute: 0 10*3/uL (ref 0.0–0.5)
Eosinophils Relative: 0 %
HCT: 37.7 % (ref 36.0–46.0)
Hemoglobin: 13 g/dL (ref 12.0–15.0)
Immature Granulocytes: 0 %
Lymphocytes Relative: 10 %
Lymphs Abs: 0.7 10*3/uL (ref 0.7–4.0)
MCH: 29.6 pg (ref 26.0–34.0)
MCHC: 34.5 g/dL (ref 30.0–36.0)
MCV: 85.9 fL (ref 80.0–100.0)
Monocytes Absolute: 0.4 10*3/uL (ref 0.1–1.0)
Monocytes Relative: 6 %
Neutro Abs: 5.8 10*3/uL (ref 1.7–7.7)
Neutrophils Relative %: 84 %
Platelet Count: 266 10*3/uL (ref 150–400)
RBC: 4.39 MIL/uL (ref 3.87–5.11)
RDW: 14 % (ref 11.5–15.5)
WBC Count: 7 10*3/uL (ref 4.0–10.5)
nRBC: 0 % (ref 0.0–0.2)

## 2022-08-09 LAB — CMP (CANCER CENTER ONLY)
ALT: 15 U/L (ref 0–44)
AST: 12 U/L — ABNORMAL LOW (ref 15–41)
Albumin: 4.7 g/dL (ref 3.5–5.0)
Alkaline Phosphatase: 86 U/L (ref 38–126)
Anion gap: 14 (ref 5–15)
BUN: 16 mg/dL (ref 8–23)
CO2: 21 mmol/L — ABNORMAL LOW (ref 22–32)
Calcium: 9.7 mg/dL (ref 8.9–10.3)
Chloride: 101 mmol/L (ref 98–111)
Creatinine: 0.85 mg/dL (ref 0.44–1.00)
GFR, Estimated: >60 mL/min (ref >60)
Glucose, Bld: 238 mg/dL — ABNORMAL HIGH (ref 70–99)
Potassium: 4 mmol/L (ref 3.5–5.1)
Sodium: 136 mmol/L (ref 135–145)
Total Bilirubin: 0.6 mg/dL (ref 0.3–1.2)
Total Protein: 7.9 g/dL (ref 6.5–8.1)

## 2022-08-09 MED ORDER — SODIUM CHLORIDE 0.9 % IV SOLN
150.0000 mg | Freq: Once | INTRAVENOUS | Status: AC
  Administered 2022-08-09: 150 mg via INTRAVENOUS
  Filled 2022-08-09: qty 150

## 2022-08-09 MED ORDER — PALONOSETRON HCL INJECTION 0.25 MG/5ML
0.2500 mg | Freq: Once | INTRAVENOUS | Status: AC
  Administered 2022-08-09: 0.25 mg via INTRAVENOUS
  Filled 2022-08-09: qty 5

## 2022-08-09 MED ORDER — SODIUM CHLORIDE 0.9 % IV SOLN
10.0000 mg | Freq: Once | INTRAVENOUS | Status: AC
  Administered 2022-08-09: 10 mg via INTRAVENOUS
  Filled 2022-08-09: qty 10

## 2022-08-09 MED ORDER — SODIUM CHLORIDE 0.9 % IV SOLN
440.0000 mg | Freq: Once | INTRAVENOUS | Status: AC
  Administered 2022-08-09: 440 mg via INTRAVENOUS
  Filled 2022-08-09: qty 44

## 2022-08-09 MED ORDER — SODIUM CHLORIDE 0.9 % IV SOLN: Freq: Once | INTRAVENOUS | Status: AC

## 2022-08-09 MED ORDER — SODIUM CHLORIDE 0.9 % IV SOLN
500.0000 mg/m2 | Freq: Once | INTRAVENOUS | Status: AC
  Administered 2022-08-09: 1000 mg via INTRAVENOUS
  Filled 2022-08-09: qty 40

## 2022-08-09 NOTE — Progress Notes (Signed)
Returned call to patient from voicemail left on Friday.  Introduced myself as Arboriculturist and to offer available resources.  Discussed one-time $1000 [employer] assistant to assist with personal expenses while going through treatment. Advised what is needed to apply and she may present today at registration to be scanned to me and they will give her grant paperwork to complete. Advised to call me at earliest convenience after appointment today to discuss grant expenses in detail. She verbalized understanding and has my card for any additional financial questions or concerns.

## 2022-08-09 NOTE — Patient Instructions (Signed)
[facility]  Discharge Instructions: Thank you for choosing [facility] to provide your oncology and hematology care.   If you have a lab appointment with the [facility], please go directly to the [facility] and check in at the registration area.   Wear comfortable clothing and clothing appropriate for easy access to any Portacath or PICC line.   We strive to give you quality time with your provider. You may need to reschedule your appointment if you arrive late (15 or more minutes).  Arriving late affects you and other patients whose appointments are after yours.  Also, if you miss three or more appointments without notifying the office, you may be dismissed from the clinic at the provider's discretion.      For prescription refill requests, have your pharmacy contact our office and allow 72 hours for refills to be completed.    Today you received the following chemotherapy and/or immunotherapy agents: Alimta, carboplatin      To help prevent nausea and vomiting after your treatment, we encourage you to take your nausea medication as directed.  BELOW ARE SYMPTOMS THAT SHOULD BE REPORTED IMMEDIATELY: *FEVER GREATER THAN 100.4 F (38 C) OR HIGHER *CHILLS OR SWEATING *NAUSEA AND VOMITING THAT IS NOT CONTROLLED WITH YOUR NAUSEA MEDICATION *UNUSUAL SHORTNESS OF BREATH *UNUSUAL BRUISING OR BLEEDING *URINARY PROBLEMS (pain or burning when urinating, or frequent urination) *BOWEL PROBLEMS (unusual diarrhea, constipation, pain near the anus) TENDERNESS IN MOUTH AND THROAT WITH OR WITHOUT PRESENCE OF ULCERS (sore throat, sores in mouth, or a toothache) UNUSUAL RASH, SWELLING OR PAIN  UNUSUAL VAGINAL DISCHARGE OR ITCHING   Items with * indicate a potential emergency and should be followed up as soon as possible or go to the Emergency Department if any problems should occur.  Please show the CHEMOTHERAPY ALERT CARD or IMMUNOTHERAPY ALERT CARD at  check-in to the Emergency Department and triage nurse.  Should you have questions after your visit or need to cancel or reschedule your appointment, please contact [facility]  Dept: [phone]  and follow the prompts.  Office hours are 8:00 a.m. to 4:30 p.m. Monday - Friday. Please note that voicemails left after 4:00 p.m. may not be returned until the following business day.  We are closed weekends and major holidays. You have access to a nurse at all times for urgent questions. Please call the main number to the clinic Dept: [phone] and follow the prompts.   For any non-urgent questions, you may also contact your provider using MyChart. We now offer e-Visits for anyone 11 and older to request care online for non-urgent symptoms. For details visit [email or web address].   Also download the MyChart app! Go to the app store, search "MyChart", open the app, select , and log in with your MyChart username and password.  Masks are optional in the cancer centers. If you would like for your care team to wear a mask while they are taking care of you, please let them know. You may have one support person who is at least 69 years old accompany you for your appointments. Pemetrexed Injection What is this medication? PEMETREXED (PEM e TREX ed) treats some types of cancer. It works by slowing down the growth of cancer cells. This medicine may be used for other purposes; ask your health care provider or pharmacist if you have questions. COMMON BRAND NAME(S): Alimta, PEMFEXY What should I tell my care team before I take this  medication? They need to know if you have any of these conditions: Infection, such as chickenpox, cold sores, or herpes Kidney disease Low blood cell levels (white cells, red cells, and platelets) Lung or breathing disease, such as asthma Radiation therapy An unusual or allergic reaction to pemetrexed, other medications, foods, dyes, or  preservatives If you or your partner are pregnant or trying to get pregnant Breast-feeding How should I use this medication? This medication is injected into a vein. It is given by your care team in a hospital or clinic setting. Talk to your care team about the use of this medication in children. Special care may be needed. Overdosage: If you think you have taken too much of this medicine contact a poison control center or emergency room at once. NOTE: This medicine is only for you. Do not share this medicine with others. What if I miss a dose? Keep appointments for follow-up doses. It is important not to miss your dose. Call your care team if you are unable to keep an appointment. What may interact with this medication? Do not take this medication with any of the following: Live virus vaccines This medication may also interact with the following: Ibuprofen This list may not describe all possible interactions. Give your health care provider a list of all the medicines, herbs, non-prescription drugs, or dietary supplements you use. Also tell them if you smoke, drink alcohol, or use illegal drugs. Some items may interact with your medicine. What should I watch for while using this medication? Your condition will be monitored carefully while you are receiving this medication. This medication may make you feel generally unwell. This is not uncommon as chemotherapy can affect healthy cells as well as cancer cells. Report any side effects. Continue your course of treatment even though you feel ill unless your care team tells you to stop. This medication can cause serious side effects. To reduce the risk, your care team may give you other medications to take before receiving this one. Be sure to follow the directions from your care team. This medication can cause a rash or redness in areas of the body that have previously had radiation therapy. If you have had radiation therapy, tell your care team if  you notice a rash in this area. This medication may increase your risk of getting an infection. Call your care team for advice if you get a fever, chills, sore throat, or other symptoms of a cold or flu. Do not treat yourself. Try to avoid being around people who are sick. Be careful brushing or flossing your teeth or using a toothpick because you may get an infection or bleed more easily. If you have any dental work done, tell your dentist you are receiving this medication. Avoid taking medications that contain aspirin, acetaminophen, ibuprofen, naproxen, or ketoprofen unless instructed by your care team. These medications may hide a fever. Check with your care team if you have severe diarrhea, nausea, and vomiting, or if you sweat a lot. The loss of too much body fluid may make it dangerous for you to take this medication. Talk to your care team if you or your partner wish to become pregnant or think either of you might be pregnant. This medication can cause serious birth defects if taken during pregnancy and for 6 months after the last dose. A negative pregnancy test is required before starting this medication. A reliable form of contraception is recommended while taking this medication and for 6 months after the  last dose. Talk to your care team about reliable forms of contraception. Do not father a child while taking this medication and for 3 months after the last dose. Use a condom while having sex during this time period. Do not breastfeed while taking this medication and for 1 week after the last dose. This medication may cause infertility. Talk to your care team if you are concerned about your fertility. What side effects may I notice from receiving this medication? Side effects that you should report to your care team as soon as possible: Allergic reactions--skin rash, itching, hives, swelling of the face, lips, tongue, or throat Dry cough, shortness of breath or trouble  breathing Infection--fever, chills, cough, sore throat, wounds that don't heal, pain or trouble when passing urine, general feeling of discomfort or being unwell Kidney injury--decrease in the amount of urine, swelling of the ankles, hands, or feet Low red blood cell level--unusual weakness or fatigue, dizziness, headache, trouble breathing Redness, blistering, peeling, or loosening of the skin, including inside the mouth Unusual bruising or bleeding Side effects that usually do not require medical attention (report to your care team if they continue or are bothersome): Fatigue Loss of appetite Nausea Vomiting This list may not describe all possible side effects. Call your doctor for medical advice about side effects. You may report side effects to FDA at 1-800-FDA-1088. Where should I keep my medication? This medication is given in a hospital or clinic. It will not be stored at home. NOTE: This sheet is a summary. It may not cover all possible information. If you have questions about this medicine, talk to your doctor, pharmacist, or health care provider.  2023 Elsevier/Gold Standard (2022-01-04 00:00:00) Carboplatin Injection What is this medication? CARBOPLATIN (KAR boe pla tin) treats some types of cancer. It works by slowing down the growth of cancer cells. This medicine may be used for other purposes; ask your health care provider or pharmacist if you have questions. COMMON BRAND NAME(S): Paraplatin What should I tell my care team before I take this medication? They need to know if you have any of these conditions: Blood disorders Hearing problems Kidney disease Recent or ongoing radiation therapy An unusual or allergic reaction to carboplatin, cisplatin, other medications, foods, dyes, or preservatives Pregnant or trying to get pregnant Breast-feeding How should I use this medication? This medication is injected into a vein. It is given by your care team in a hospital or clinic  setting. Talk to your care team about the use of this medication in children. Special care may be needed. Overdosage: If you think you have taken too much of this medicine contact a poison control center or emergency room at once. NOTE: This medicine is only for you. Do not share this medicine with others. What if I miss a dose? Keep appointments for follow-up doses. It is important not to miss your dose. Call your care team if you are unable to keep an appointment. What may interact with this medication? Medications for seizures Some antibiotics, such as amikacin, gentamicin, neomycin, streptomycin, tobramycin Vaccines This list may not describe all possible interactions. Give your health care provider a list of all the medicines, herbs, non-prescription drugs, or dietary supplements you use. Also tell them if you smoke, drink alcohol, or use illegal drugs. Some items may interact with your medicine. What should I watch for while using this medication? Your condition will be monitored carefully while you are receiving this medication. You may need blood work while taking  this medication. This medication may make you feel generally unwell. This is not uncommon, as chemotherapy can affect healthy cells as well as cancer cells. Report any side effects. Continue your course of treatment even though you feel ill unless your care team tells you to stop. In some cases, you may be given additional medications to help with side effects. Follow all directions for their use. This medication may increase your risk of getting an infection. Call your care team for advice if you get a fever, chills, sore throat, or other symptoms of a cold or flu. Do not treat yourself. Try to avoid being around people who are sick. Avoid taking medications that contain aspirin, acetaminophen, ibuprofen, naproxen, or ketoprofen unless instructed by your care team. These medications may hide a fever. Be careful brushing or  flossing your teeth or using a toothpick because you may get an infection or bleed more easily. If you have any dental work done, tell your dentist you are receiving this medication. Talk to your care team if you wish to become pregnant or think you might be pregnant. This medication can cause serious birth defects. Talk to your care team about effective forms of contraception. Do not breast-feed while taking this medication. What side effects may I notice from receiving this medication? Side effects that you should report to your care team as soon as possible: Allergic reactions--skin rash, itching, hives, swelling of the face, lips, tongue, or throat Infection--fever, chills, cough, sore throat, wounds that don't heal, pain or trouble when passing urine, general feeling of discomfort or being unwell Low red blood cell level--unusual weakness or fatigue, dizziness, headache, trouble breathing Pain, tingling, or numbness in the hands or feet, muscle weakness, change in vision, confusion or trouble speaking, loss of balance or coordination, trouble walking, seizures Unusual bruising or bleeding Side effects that usually do not require medical attention (report to your care team if they continue or are bothersome): Hair loss Nausea Unusual weakness or fatigue Vomiting This list may not describe all possible side effects. Call your doctor for medical advice about side effects. You may report side effects to FDA at 1-800-FDA-1088. Where should I keep my medication? This medication is given in a hospital or clinic. It will not be stored at home. NOTE: This sheet is a summary. It may not cover all possible information. If you have questions about this medicine, talk to your doctor, pharmacist, or health care provider.  2023 Elsevier/Gold Standard (2021-12-14 00:00:00)

## 2022-08-09 NOTE — Progress Notes (Signed)
Patient provided documentation and signed grant paperwork at check-in.  Patient approved for one-time $1000 [employer] grant to assist with personal expenses while going through treatment. She has a copy of the approval letter and expense sheet along with the Outpatient pharmacy information. She received a gift card today. She was advised to contact me at her convenience to discuss other expenses.  She has my card for any additional financial questions or concerns.

## 2022-08-10 ENCOUNTER — Telehealth: Payer: Self-pay

## 2022-08-10 NOTE — Telephone Encounter (Signed)
Ms Rouser states that she is doing fine.  She is eating, drinking, and urinating well. She knows to call the office at  [phone] if she has any questions or concerns. She would rather not have a port-a-cath since she has only 3 treatments left.  She will discuss this issue with Dr. Julien Nordmann at visit on 08-16-22

## 2022-08-10 NOTE — Telephone Encounter (Signed)
-----   Message from Dionne Ano, RN sent at 08/09/2022  1:17 PM EST ----- Regarding: Follow up 1st time Dr Julien Nordmann Alimta Carbo 11/28 Can you please ask about a port?    Unless her scan after cycle #4 shows disease progression but she is getting adjuvant chemo now and it is only approved for 4 cycles. Her PDL expression is 0 so she is not a candidate for adjuvant tecentriq after her chemo (Cassie NP)  However they do not know about disease progression.  Also patient is a very hard stick.  Small fragile limited veins  Thanks!

## 2022-08-11 ENCOUNTER — Ambulatory Visit (INDEPENDENT_AMBULATORY_CARE_PROVIDER_SITE_OTHER): Payer: PPO | Admitting: Primary Care

## 2022-08-11 ENCOUNTER — Encounter: Payer: Self-pay | Admitting: Primary Care

## 2022-08-11 ENCOUNTER — Other Ambulatory Visit: Payer: Self-pay | Admitting: Primary Care

## 2022-08-11 VITALS — BP 138/72 | HR 70 | Temp 97.5°F | Ht 68.0 in | Wt 168.0 lb

## 2022-08-11 DIAGNOSIS — M62838 Other muscle spasm: Secondary | ICD-10-CM | POA: Diagnosis not present

## 2022-08-11 DIAGNOSIS — M4714 Other spondylosis with myelopathy, thoracic region: Secondary | ICD-10-CM

## 2022-08-11 DIAGNOSIS — C3432 Malignant neoplasm of lower lobe, left bronchus or lung: Secondary | ICD-10-CM | POA: Diagnosis not present

## 2022-08-11 DIAGNOSIS — E1165 Type 2 diabetes mellitus with hyperglycemia: Secondary | ICD-10-CM | POA: Diagnosis not present

## 2022-08-11 DIAGNOSIS — M4727 Other spondylosis with radiculopathy, lumbosacral region: Secondary | ICD-10-CM

## 2022-08-11 MED ORDER — METFORMIN HCL 1000 MG PO TABS: 1000.0000 mg | ORAL_TABLET | Freq: Two times a day (BID) | ORAL | 0 refills | Status: DC

## 2022-08-11 MED ORDER — BACLOFEN 10 MG PO TABS: 10.0000 mg | ORAL_TABLET | Freq: Every day | ORAL | 2 refills | Status: DC

## 2022-08-11 NOTE — Assessment & Plan Note (Signed)
Suspect recent hyperglycemia secondary to steroid treatment for chemotherapy.  Since she will continue with chemotherapy and steroid treatment for the next few months we will adjust her diabetes medications accordingly.  Increase metformin to 1000 mg twice daily. Repeat A1c is due next month.  She will update if glucose readings or not reducing.

## 2022-08-11 NOTE — Assessment & Plan Note (Signed)
After further discussion it appears that she is not taking baclofen at home.  Refill provided to take baclofen 10 mg at bedtime. She will update if no improvement.

## 2022-08-11 NOTE — Assessment & Plan Note (Signed)
Following with cardiothoracic surgery and oncology.  Continue chemotherapy every 3 weeks through late January 2024.

## 2022-08-11 NOTE — Progress Notes (Signed)
Subjective:    Patient ID: Megan [name], female    DOB: March 28, 1951, 71 y.o.   MRN: [id number]  HPI  Megan [name] is a very pleasant 71 y.o. female with a significant medical history including hypertension, adenocarcinoma of the left lower lung, type 2 diabetes, overactive bladder, neuropathic pain, lumbosacral spondylosis who presents today to discuss hyperglycemia and lower extremity spasms.   1) Type 2 Diabetes: Currently managed on metformin 500 mg twice daily for type 2 diabetes.  Last A1c was 7.1 in September 2023. One week ago she started noticing some higher blood sugar readings than normal.  She is checking her blood sugar once daily in the morning before breakfast and typically gets readings in the 110s.  Towards the end of last week she noticed a slight elevation in readings in the mid 100's.  3 days ago she began noticing even higher readings in the low 200's.   She underwent her first chemotherapy infusion on 08/09/2022 for left lower lobe adenocarcinoma. She is managed on dexamethasone 4 mg twice daily the day prior to and day after of her chemotherapy treatment.  She will continue this regimen until late January 2024.  She denies change in her diet.  2) Spacticity/Muscle Spasms: Chronic to lower extremities since spondylogenic compression of the thoracic spine and lumbar spondylosis.  She is prescribed baclofen 10 mg to take once or twice daily as needed for muscle spasms.    Over the last several months she has noticed bilateral leg spasms occurring approximately 2-3 am each night while in bed.  Right worse than left.  Her right leg will begin jerking/spasming which will wake her from sleep and prevent her from resuming sleep.  She does not recall currently taking baclofen, is not sure when she stopped.  She does not have baclofen medication at home.  Review of Systems  Eyes:  Positive for visual disturbance.  Respiratory:  Negative for shortness of breath.    Cardiovascular:  Negative for chest pain.  Musculoskeletal:  Positive for myalgias.       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   Constipation due to pain medication    Debility    Depression    Dyspnea    W/ PHYS CONDITION    Emphysema of lung ([place])    Foul smelling urine 07/22/2020   GERD (gastroesophageal reflux disease)    History of bronchitis    "not since I quit smoking" (07/26/2017)   History of kidney stones    History of shingles    Hyperlipidemia    takes Fish Oil daily   Hypertension    Hypoalbuminemia due to protein-calorie malnutrition ([place])    L3 vertebral fracture ([place]) 09/01/2017   Lung cancer ([place]) 2023   Lung nodule 04/21/2022   Lung nodule seen on imaging study 08/14/2021   Added automatically from request for surgery [id number]   Myelopathy ([place]) 12/21/2016   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   Neuromuscular disorder (HCC)    tingling toes   Paraparesis ([place]) 12/15/2016   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   Pneumonia 2009   PONV (postoperative nausea and vomiting)    Radiculopathy 07/28/2017   Restless leg    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   Type 2 diabetes mellitus ([place])    Uterine cancer ([place]) 1979   S/P hysterectomy   Weakness    numbness and tingling in both feet r/t back    Social History   Socioeconomic History   Marital status: Married    Spouse name: [name]   Number of children: 1   Years of education: 12   Highest education level: Not on file  Occupational History   Not on file  Tobacco Use   Smoking status: Former    Packs/day: 1.50    Years: 35.00    Total pack years: 52.50    Types: Cigarettes    Quit  date: 05/13/2006    Years since quitting: 16.2   Smokeless tobacco: Never  Vaping Use   Vaping Use: Never used  Substance and Sexual Activity   Alcohol use: Yes    Alcohol/week: 2.0 standard drinks of alcohol    Types: 2 Glasses of wine per week   Drug use: No   Sexual activity: Not Currently    Birth control/protection: Surgical  Other Topics Concern   Not on file  Social History Narrative   Lives w/ husband   Married.   1 child, 1 grandchildren.   Retired. Once worked for [employer].   Enjoys reading.    Social Determinants of Health   Financial Resource Strain: Low Risk  (01/01/2022)   Overall Financial Resource Strain (CARDIA)    Difficulty of Paying Living Expenses: Not hard at all  Food Insecurity: No Food Insecurity (06/16/2022)   Hunger Vital Sign    Worried About Running Out of Food in the Last Year: Never true    Ran Out of Food in the Last Year: Never true  Transportation Needs: No Transportation Needs (06/16/2022)   PRAPARE - Hydrologist (Medical): No    Lack of Transportation (Non-Medical): No  Physical Activity: Insufficiently Active (01/01/2022)   Exercise Vital Sign    Days of Exercise per Week: 2 days    Minutes of Exercise per Session: 20 min  Stress: No Stress Concern Present (12/31/2020)   [place]    Feeling of Stress : Not at all  Social Connections: Moderately Isolated (01/01/2022)   Social Connection and Isolation Panel [NHANES]    Frequency of Communication with Friends and Family: Twice a week    Frequency of Social Gatherings with Friends and Family: Twice a week    Attends Religious Services: Never    Marine scientist or Organizations: No    Attends Archivist Meetings: Never    Marital Status: Married  Human resources officer Violence: Not At Risk (06/16/2022)   Humiliation, Afraid, Rape, and Kick questionnaire    Fear of Current or  Ex-Partner: No    Emotionally Abused: No    Physically Abused: No    Sexually Abused: No    Past Surgical History:  Procedure Laterality Date   ABDOMINAL EXPOSURE N/A 07/26/2017   Procedure: ABDOMINAL EXPOSURE;  Surgeon: Angelia Mould, MD;  Location: [place];  Service: Vascular;  Laterality: N/A;   ANTERIOR CERVICAL DECOMP/DISCECTOMY FUSION  2001   ANTERIOR LUMBAR FUSION N/A 07/26/2017   Procedure: Lumbar five-Sacral one Anterior lumbar interbody fusion with Dr. Deitra Mayo for approach;  Surgeon: Ditty, Kevan Ny, MD;  Location: [place];  Service: Neurosurgery;  Laterality: N/A;  APPLICATION OF ROBOTIC ASSISTANCE FOR SPINAL PROCEDURE  12/19/2016   Procedure: APPLICATION OF ROBOTIC ASSISTANCE FOR SPINAL PROCEDURE;  Surgeon: Kevan Ny Ditty, MD;  Location: [place];  Service: Neurosurgery;;   APPLICATION OF ROBOTIC ASSISTANCE FOR SPINAL PROCEDURE N/A 07/26/2017   Procedure: APPLICATION OF ROBOTIC ASSISTANCE FOR SPINAL PROCEDURE;  Surgeon: Ditty, Kevan Ny, MD;  Location: [place];  Service: Neurosurgery;  Laterality: N/A;   APPLICATION OF ROBOTIC ASSISTANCE FOR SPINAL PROCEDURE N/A 09/02/2017   Procedure: APPLICATION OF ROBOTIC ASSISTANCE FOR SPINAL PROCEDURE;  Surgeon: Ditty, Kevan Ny, MD;  Location: [facility];  Service: Neurosurgery;  Laterality: N/A;   BACK SURGERY     BLADDER SUSPENSION  1991   tack   BRONCHIAL BIOPSY  09/15/2021   Procedure: BRONCHIAL BIOPSIES;  Surgeon: Garner Nash, DO;  Location: [facility];  Service: Pulmonary;;   BRONCHIAL BIOPSY  05/04/2022   Procedure: BRONCHIAL BIOPSIES;  Surgeon: Garner Nash, DO;  Location: [facility];  Service: Pulmonary;;   BRONCHIAL BRUSHINGS  09/15/2021   Procedure: BRONCHIAL BRUSHINGS;  Surgeon: Garner Nash, DO;  Location: [facility];  Service: Pulmonary;;   BRONCHIAL NEEDLE ASPIRATION BIOPSY  09/15/2021   Procedure: BRONCHIAL NEEDLE ASPIRATION BIOPSIES;  Surgeon: Garner Nash, DO;  Location: [place];  Service: Pulmonary;;   BRONCHIAL NEEDLE ASPIRATION BIOPSY  05/04/2022   Procedure: BRONCHIAL NEEDLE ASPIRATION BIOPSIES;  Surgeon: Garner Nash, DO;  Location: [facility];  Service: Pulmonary;;   BRONCHIAL WASHINGS  09/15/2021   Procedure: BRONCHIAL WASHINGS;  Surgeon: Garner Nash, DO;  Location: [facility];  Service: Pulmonary;;   FIDUCIAL MARKER PLACEMENT  09/15/2021   Procedure: FIDUCIAL MARKER PLACEMENT;  Surgeon: Garner Nash, DO;  Location: [facility];  Service: Pulmonary;;   INGUINAL HERNIA REPAIR Right 1991   INTERCOSTAL NERVE BLOCK Left 06/16/2022   Procedure: INTERCOSTAL NERVE BLOCK;  Surgeon: [name], MD;  Location: [facility];  Service: Thoracic;  Laterality: Left;   LUMBAR FUSION  2016; 2017; 07/26/2017   L4-5; L2-3; L5-S1   LYMPH NODE DISSECTION Left 06/16/2022   Procedure: LYMPH NODE DISSECTION;  Surgeon: [name], MD;  Location: [place] [facility] [place];  Service: Thoracic;  Laterality: Left;   POSTERIOR LUMBAR FUSION 4 WITH HARDWARE REMOVAL N/A 09/02/2017   Procedure: Lumbar three-four redo laminectomy; Repositioning of Left Sacral two screw; Extension of lumbar fusion to Thoracic twelve;  Surgeon: Ditty, Kevan Ny, MD;  Location: [facility];  Service: Neurosurgery;  Laterality: N/A;   TONSILLECTOMY AND ADENOIDECTOMY  1959   TUMOR EXCISION     WERTHIN'S TUMORS BOTH SIDES OF NECK   VAGINAL HYSTERECTOMY  1979   VIDEO BRONCHOSCOPY WITH RADIAL ENDOBRONCHIAL ULTRASOUND  09/15/2021   Procedure: VIDEO BRONCHOSCOPY WITH RADIAL ENDOBRONCHIAL ULTRASOUND;  Surgeon: Garner Nash, DO;  [facility];  Service: Pulmonary;;   VIDEO BRONCHOSCOPY WITH RADIAL ENDOBRONCHIAL ULTRASOUND  05/04/2022   Procedure: VIDEO BRONCHOSCOPY WITH RADIAL ENDOBRONCHIAL ULTRASOUND;  Surgeon: Garner Nash, DO;  Location: [facility];  Service: Pulmonary;;    Family History  Problem Relation Age of Onset   Diabetes Mother    Dementia Mother    Cirrhosis Mother         Non alcoholic   COPD Father    Diabetes Brother     Allergies  Allergen Reactions   Shellfish Allergy Anaphylaxis, Swelling and Other (See Comments)    Tongue swells   Oxycodone Nausea Only   Penicillins Rash and Other (See Comments)    Has  patient had a PCN reaction causing immediate rash, facial/tongue/throat swelling, SOB or lightheadedness with hypotension: Yes Has patient had a PCN reaction causing severe rash involving mucus membranes or skin necrosis: No Has patient had a PCN reaction that required hospitalization No Has patient had a PCN reaction occurring within the last 10 years: No If all of the above answers are "NO", then may proceed with Cephalosporin use.    Current Outpatient Medications on File Prior to Visit  Medication Sig Dispense Refill   amLODipine (NORVASC) 10 MG tablet Take 1 tablet (10 mg total) by mouth daily. For blood pressure 90 tablet 3   atorvastatin (LIPITOR) 40 MG tablet Take 1 tablet (40 mg total) by mouth daily. For cholesterol. 90 tablet 3   Biotin 5000 MCG CAPS Take 5,000 mcg by mouth every morning.      cetirizine (ZYRTEC) 10 MG tablet Take 1 tablet (10 mg total) by mouth daily. For allergies 90 tablet 0   Cholecalciferol (VITAMIN D) 50 MCG (2000 UT) tablet Take 2,000 Units by mouth daily.     Coenzyme Q10 (COQ10) 100 MG CAPS Take 100 mg by mouth every evening.     dexamethasone (DECADRON) 4 MG tablet 4 mg p.o. twice daily the day before, day of and day after chemotherapy every 3 weeks 30 tablet 0   esomeprazole (NEXIUM 24HR) 20 MG capsule Take 1 capsule (20 mg total) by mouth daily at 12 noon. 90 capsule 1   fluocinonide cream (LIDEX) 0.35 % Apply 1 Application topically 2 (two) times daily as needed (irritation).     folic acid (FOLVITE) 1 MG tablet Take 1 tablet (1 mg total) by mouth daily. 30 tablet 4   lisinopril (ZESTRIL) 20 MG tablet TAKE 1 TABLET(20 MG) BY MOUTH DAILY FOR BLOOD PRESSURE 90 tablet 3   nystatin-triamcinolone ointment  (MYCOLOG) Apply 1 Application topically 2 (two) times daily as needed (irritation).     oxybutynin (DITROPAN-XL) 5 MG 24 hr tablet TAKE 1 TABLET(5 MG) BY MOUTH AT BEDTIME FOR OVERACTIVE BLADDER 90 tablet 0   oxybutynin (OXYTROL) 3.9 MG/24HR Place 1 patch onto the skin See admin instructions. Change every 5 days     Polyethyl Glycol-Propyl Glycol (LUBRICANT EYE DROPS) 0.4-0.3 % SOLN Place 1-2 drops into both eyes 2 (two) times daily.     prochlorperazine (COMPAZINE) 10 MG tablet Take 1 tablet (10 mg total) by mouth every 6 (six) hours as needed for nausea or vomiting. 30 tablet 0   senna-docusate (SENOKOT-S) 8.6-50 MG tablet Take 2 tablets by mouth 2 (two) times daily. (Patient taking differently: Take 1 tablet by mouth every other day.)     ibuprofen (ADVIL) 200 MG tablet Take 600 mg by mouth every 8 (eight) hours as needed (pain). (Patient not taking: Reported on 08/11/2022)     No current facility-administered medications on file prior to visit.    BP 138/72   Pulse 70   Temp (!) 97.5 F (36.4 C) (Temporal)   Ht 5\' 8"  (1.727 m)   Wt 168 lb (76.2 kg)   SpO2 98%   BMI 25.54 kg/m  Objective:   Physical Exam Cardiovascular:     Rate and Rhythm: Normal rate and regular rhythm.  Pulmonary:     Effort: Pulmonary effort is normal.     Breath sounds: Normal breath sounds.  Musculoskeletal:     Cervical back: Neck supple.  Skin:    General: Skin is warm and dry.  Assessment & Plan:   Problem List Items Addressed This Visit       Respiratory   Adenocarcinoma of lower lobe of left lung [facility])    Following with cardiothoracic surgery and oncology.  Continue chemotherapy every 3 weeks through late January 2024.         Endocrine   Type 2 diabetes mellitus with hyperglycemia ([place]) - Primary    Suspect recent hyperglycemia secondary to steroid treatment for chemotherapy.  Since she will continue with chemotherapy and steroid treatment for the next few months we will  adjust her diabetes medications accordingly.  Increase metformin to 1000 mg twice daily. Repeat A1c is due next month.  She will update if glucose readings or not reducing.      Relevant Medications   metFORMIN (GLUCOPHAGE) 1000 MG tablet     Other   Muscle spasms of both lower extremities    After further discussion it appears that she is not taking baclofen at home.  Refill provided to take baclofen 10 mg at bedtime. She will update if no improvement.      Relevant Medications   baclofen (LIORESAL) 10 MG tablet       Pleas Koch, NP

## 2022-08-13 ENCOUNTER — Other Ambulatory Visit: Payer: Self-pay | Admitting: Medical Oncology

## 2022-08-13 DIAGNOSIS — C3432 Malignant neoplasm of lower lobe, left bronchus or lung: Secondary | ICD-10-CM

## 2022-08-14 ENCOUNTER — Emergency Department (HOSPITAL_COMMUNITY): Payer: PPO

## 2022-08-14 ENCOUNTER — Emergency Department (HOSPITAL_COMMUNITY)
Admission: EM | Admit: 2022-08-14 | Discharge: 2022-08-14 | Disposition: A | Discharge status: Home / Self Care | Payer: PPO | Attending: Emergency Medicine | Admitting: Emergency Medicine

## 2022-08-14 ENCOUNTER — Other Ambulatory Visit: Payer: Self-pay

## 2022-08-14 DIAGNOSIS — R0689 Other abnormalities of breathing: Secondary | ICD-10-CM | POA: Diagnosis not present

## 2022-08-14 DIAGNOSIS — Z8542 Personal history of malignant neoplasm of other parts of uterus: Secondary | ICD-10-CM | POA: Diagnosis not present

## 2022-08-14 DIAGNOSIS — E1165 Type 2 diabetes mellitus with hyperglycemia: Secondary | ICD-10-CM | POA: Insufficient documentation

## 2022-08-14 DIAGNOSIS — J90 Pleural effusion, not elsewhere classified: Secondary | ICD-10-CM | POA: Diagnosis not present

## 2022-08-14 DIAGNOSIS — Z1152 Encounter for screening for COVID-19: Secondary | ICD-10-CM | POA: Insufficient documentation

## 2022-08-14 DIAGNOSIS — R058 Other specified cough: Secondary | ICD-10-CM | POA: Insufficient documentation

## 2022-08-14 DIAGNOSIS — R531 Weakness: Secondary | ICD-10-CM | POA: Diagnosis not present

## 2022-08-14 DIAGNOSIS — I10 Essential (primary) hypertension: Secondary | ICD-10-CM | POA: Insufficient documentation

## 2022-08-14 DIAGNOSIS — R059 Cough, unspecified: Secondary | ICD-10-CM | POA: Diagnosis not present

## 2022-08-14 DIAGNOSIS — I959 Hypotension, unspecified: Secondary | ICD-10-CM | POA: Diagnosis not present

## 2022-08-14 DIAGNOSIS — C3432 Malignant neoplasm of lower lobe, left bronchus or lung: Secondary | ICD-10-CM | POA: Diagnosis not present

## 2022-08-14 LAB — COMPREHENSIVE METABOLIC PANEL
ALT: 28 U/L (ref 0–44)
AST: 22 U/L (ref 15–41)
Albumin: 3.9 g/dL (ref 3.5–5.0)
Alkaline Phosphatase: 77 U/L (ref 38–126)
Anion gap: 11 (ref 5–15)
BUN: 19 mg/dL (ref 8–23)
CO2: 26 mmol/L (ref 22–32)
Calcium: 9.2 mg/dL (ref 8.9–10.3)
Chloride: 102 mmol/L (ref 98–111)
Creatinine, Ser: 0.81 mg/dL (ref 0.44–1.00)
GFR, Estimated: >60 mL/min (ref >60)
Glucose, Bld: 151 mg/dL — ABNORMAL HIGH (ref 70–99)
Potassium: 4.1 mmol/L (ref 3.5–5.1)
Sodium: 139 mmol/L (ref 135–145)
Total Bilirubin: 0.8 mg/dL (ref 0.3–1.2)
Total Protein: 6.9 g/dL (ref 6.5–8.1)

## 2022-08-14 LAB — URINALYSIS, ROUTINE W REFLEX MICROSCOPIC
Bilirubin Urine: NEGATIVE
Glucose, UA: NEGATIVE mg/dL
Hgb urine dipstick: NEGATIVE
Ketones, ur: NEGATIVE mg/dL
Leukocytes,Ua: NEGATIVE
Nitrite: NEGATIVE
Protein, ur: NEGATIVE mg/dL
Specific Gravity, Urine: 1.01 (ref 1.005–1.030)
pH: 6 (ref 5.0–8.0)

## 2022-08-14 LAB — RESP PANEL BY RT-PCR (FLU A&B, COVID) ARPGX2
Influenza A by PCR: NEGATIVE
Influenza B by PCR: NEGATIVE
SARS Coronavirus 2 by RT PCR: NEGATIVE

## 2022-08-14 LAB — CBC
HCT: 39.4 % (ref 36.0–46.0)
Hemoglobin: 12.7 g/dL (ref 12.0–15.0)
MCH: 28.9 pg (ref 26.0–34.0)
MCHC: 32.2 g/dL (ref 30.0–36.0)
MCV: 89.7 fL (ref 80.0–100.0)
Platelets: 180 10*3/uL (ref 150–400)
RBC: 4.39 MIL/uL (ref 3.87–5.11)
RDW: 14 % (ref 11.5–15.5)
WBC: 5.6 10*3/uL (ref 4.0–10.5)
nRBC: 0 % (ref 0.0–0.2)

## 2022-08-14 LAB — CBG MONITORING, ED: Glucose-Capillary: 171 mg/dL — ABNORMAL HIGH (ref 70–99)

## 2022-08-14 LAB — MAGNESIUM: Magnesium: 1.8 mg/dL (ref 1.7–2.4)

## 2022-08-14 MED ORDER — SODIUM CHLORIDE 0.9 % IV BOLUS
1000.0000 mL | Freq: Once | INTRAVENOUS | Status: AC
  Administered 2022-08-14: 1000 mL via INTRAVENOUS

## 2022-08-14 NOTE — ED Triage Notes (Signed)
Pt BIBA from home. Pt had 1st chemo tx 5x days ago. Pt c/o weakness for 3x days. Feeling like heart is beating in ears.  Aox4  BP: 154/76 HR: 97 SPO2: 98 RA CBG: 240

## 2022-08-14 NOTE — ED Notes (Signed)
Pt ambulated around rm w/walker. Ate crackers w/ peanut butter.

## 2022-08-14 NOTE — Discharge Instructions (Signed)
You were seen in the emergency department today with generalized weakness.  I suspect this is from your chemotherapy.  Your lab work, urine test, chest x-ray are all normal.  Please continue to drink plenty of fluids and follow with your oncology team closely.  Return with any new or suddenly worsening symptoms.

## 2022-08-14 NOTE — ED Provider Notes (Signed)
Emergency Department Provider Note   I have reviewed the triage vital signs and the nursing notes.   HISTORY  Chief Complaint Weakness   HPI Megan [name] is a 71 y.o. female with PMH DM, HLD, adenocarcinoma of the LLL starting chemotherapy this past week. She initially felt well but in the coming days has developed severe weakness. No fever/chills. No BRBPR. She has had some mild cough and congestion. CBG has been running a bit high but notes she is also on steroids. No abdominal pain, vomiting, or diarrhea.    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   Constipation due to pain medication    Debility    Depression    Dyspnea    W/ PHYS CONDITION    Emphysema of lung ([place])    Foul smelling urine 07/22/2020   GERD (gastroesophageal reflux disease)    History of bronchitis    "not since I quit smoking" (07/26/2017)   History of kidney stones    History of shingles    Hyperlipidemia    takes Fish Oil daily   Hypertension    Hypoalbuminemia due to protein-calorie malnutrition ([place])    L3 vertebral fracture ([place]) 09/01/2017   Lung cancer ([place]) 2023   Lung nodule 04/21/2022   Lung nodule seen on imaging study 08/14/2021   Added automatically from request for surgery [id number]   Myelopathy ([place]) 12/21/2016   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 Positive weakness.  Cardiovascular: Denies chest pain. Respiratory: Denies shortness of breath. Gastrointestinal: No abdominal pain.  No nausea, no vomiting.  No diarrhea.  No constipation. Genitourinary: Negative for dysuria. Musculoskeletal: Negative for back pain. Skin: Negative for rash. Neurological: Negative for headaches.  ____________________________________________   PHYSICAL EXAM:  VITAL SIGNS: ED Triage Vitals  Enc Vitals Group     BP 08/14/22 1328 (!) 142/68     Pulse Rate 08/14/22 1328 82     Resp 08/14/22 1328 16     Temp 08/14/22 1328 98.9 F (37.2 C)     Temp Source 08/14/22 1328 Oral     SpO2 08/14/22 1328 98 %   Constitutional: Alert and oriented. Well appearing and in no acute distress. Eyes: Conjunctivae are normal.  Head: Atraumatic. Nose: No congestion/rhinnorhea. Mouth/Throat: Mucous membranes are moist.  Neck: No stridor.   Cardiovascular: Normal rate, regular rhythm. Good peripheral circulation. Grossly normal heart sounds.   Respiratory: Normal respiratory effort.  No retractions. Lungs CTAB. Gastrointestinal: Soft and nontender. No distention.  Musculoskeletal: No lower extremity tenderness nor edema. No gross deformities of extremities. Neurologic:  Normal speech and language. No gross focal neurologic deficits are appreciated.  Skin:  Skin is warm, dry and intact. No  rash noted.  ____________________________________________   LABS (all labs ordered are listed, but only abnormal results are displayed)  Labs Reviewed  COMPREHENSIVE METABOLIC PANEL - Abnormal; Notable for the following components:      Result Value   Glucose, Bld 151 (*)    All other components within normal limits  CBG MONITORING, ED - Abnormal; Notable for the following components:   Glucose-Capillary 171 (*)    All other components within normal limits   RESP PANEL BY RT-PCR (FLU A&B, COVID) ARPGX2  CBC  URINALYSIS, ROUTINE W REFLEX MICROSCOPIC  MAGNESIUM   ____________________________________________  EKG   EKG Interpretation  Date/Time:  Saturday August 14 2022 13:29:29 EST Ventricular Rate:  85 PR Interval:  131 QRS Duration: 89 QT Interval:  373 QTC Calculation: 444 R Axis:   -44 Text Interpretation: Sinus rhythm Left axis deviation Consider anterior infarct Confirmed by Nanda Quinton [id number]) on 08/14/2022 3:10:49 PM        ____________________________________________  RADIOLOGY  DG Chest 2 View  Result Date: 08/14/2022 CLINICAL DATA:  Cough EXAM: CHEST - 2 VIEW COMPARISON:  07/30/2022 FINDINGS: Left-sided pleural effusion does not appear significantly changed compared to the most recent prior study. Left lung above the effusion and right lung are clear. No pneumothorax. Normal pulmonary vasculature. Thoracolumbar spine fixation hardware noted. IMPRESSION: Stable appearance of the chest with a moderate left-sided pleural effusion. Electronically Signed   By: Sammie Bench M.D.   On: 08/14/2022 16:04    ____________________________________________   PROCEDURES  Procedure(s) performed:   Procedures  None  ____________________________________________   INITIAL IMPRESSION / ASSESSMENT AND PLAN / ED COURSE  Pertinent labs & imaging results that were available during my care of the patient were reviewed by me and considered in my medical decision making (see chart for details).   This patient is Presenting for Evaluation of weakness, which does require a range of treatment options, and is a complaint that involves a high risk of morbidity and mortality.  The Differential Diagnoses includes but is not exclusive to alcohol, illicit or prescription medications, intracranial pathology such as stroke, intracerebral hemorrhage, fever or infectious causes including sepsis, hypoxemia, uremia, trauma, endocrine related  disorders such as diabetes, hypoglycemia, thyroid-related diseases, etc.   Critical Interventions-    Medications  sodium chloride 0.9 % bolus 1,000 mL (0 mLs Intravenous Stopped 08/14/22 1747)    Reassessment after intervention: Symptoms improved. Patient feeling more awake and eating in the ED.    I did obtain Additional Historical Information from husband at bedside.    I decided to review pertinent External Data, and in summary patient on Pemetrexed and Carboplatin. Followed by Dr. Julien Nordmann.   Clinical Laboratory Tests Ordered, included CMP without AKI. Normal LFTs. Normal lytes. Hyperglycemia without DKA. No anemia or leukopenia.   Radiologic Tests Ordered, included CXR. I independently interpreted the images and agree with radiology interpretation.   Cardiac Monitor Tracing which shows NSR.   Social Determinants of Health Risk patient is not an active smoker.   Medical Decision Making: Summary:  Patient presents to the emergency department with generalized weakness after starting chemotherapy.  Initial blood work is overall reassuring.  No focal neurodeficits.  Plan for IV fluids, chest x-ray, respiratory panel and reassess.  Reevaluation with update and discussion with patient.  Workup here is reassuring.  Plan for continued management of symptoms at home and oncology follow-up in the coming week.  Considered admission but workup here is reassuring and patient is feeling improved after  IV fluids.  Appears safe for discharge.  Disposition: discharge  ____________________________________________  FINAL CLINICAL IMPRESSION(S) / ED DIAGNOSES  Final diagnoses:  Generalized weakness    Note:  This document was prepared using Dragon voice recognition software and may include unintentional dictation errors.  Nanda Quinton, MD, [facility]    Ameria Sanjurjo, [facility], MD 08/14/22 [phone]

## 2022-08-16 ENCOUNTER — Inpatient Hospital Stay: Payer: PPO | Attending: Internal Medicine

## 2022-08-16 ENCOUNTER — Inpatient Hospital Stay (HOSPITAL_BASED_OUTPATIENT_CLINIC_OR_DEPARTMENT_OTHER): Payer: PPO | Admitting: Internal Medicine

## 2022-08-16 VITALS — BP 138/74 | HR 82 | Temp 98.2°F | Resp 19 | Wt 161.6 lb

## 2022-08-16 DIAGNOSIS — Z5111 Encounter for antineoplastic chemotherapy: Secondary | ICD-10-CM

## 2022-08-16 DIAGNOSIS — Z79899 Other long term (current) drug therapy: Secondary | ICD-10-CM | POA: Diagnosis not present

## 2022-08-16 DIAGNOSIS — Z902 Acquired absence of lung [part of]: Secondary | ICD-10-CM | POA: Diagnosis not present

## 2022-08-16 DIAGNOSIS — C3432 Malignant neoplasm of lower lobe, left bronchus or lung: Secondary | ICD-10-CM | POA: Insufficient documentation

## 2022-08-16 NOTE — Progress Notes (Signed)
[facility] [phone]   [phone]  OFFICE PROGRESS NOTE  Pleas Koch, NP [place] [address]  DIAGNOSIS: Stage IIb (T3, N0, M0)  non-small cell lung cancer, adenocarcinoma presented with large left lower lobe lung mass diagnosed in August 2023  Molecular studies by foundation 1 showed no actionable mutation and she has negative PD-L1 expression.  PRIOR THERAPY: Status post left lower lobectomy with lymph node sampling under the care of Dr. Kipp Brood on June 16, 2022 with tumor size of 5.5 cm and visceropleural involvement.  CURRENT THERAPY: Adjuvant systemic chemotherapy with carboplatin for AUC of 5 and Alimta 500 Mg/M2.  First dose August 09, 2022.  INTERVAL HISTORY: Megan [name] 71 y.o. female returns to the clinic today for follow-up visit accompanied by her husband.  The patient is feeling fine today with no concerning complaints.  She tolerated the first dose of her treatment fairly well except for fatigue started 3 days later and she presented to the emergency department and has extensive evaluation that was unremarkable.  This probably was secondary to the taper of her Decadron premedication.  She denied having any current chest pain, shortness of breath, cough or hemoptysis.  She has no nausea, vomiting, diarrhea or constipation.  She has no headache or visual changes.  She is here today for evaluation and recommendation regarding her condition.  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   Constipation due to pain medication    Debility    Depression    Dyspnea    W/ PHYS CONDITION    Emphysema of lung ([facility])    Foul smelling urine 07/22/2020   GERD (gastroesophageal reflux disease)    History of bronchitis     "not since I quit smoking" (07/26/2017)   History of kidney stones    History of shingles    Hyperlipidemia    takes Fish Oil daily   Hypertension    Hypoalbuminemia due to protein-calorie malnutrition ([facility])    L3 vertebral fracture ([facility]) 09/01/2017   Lung cancer ([facility]) 2023   Lung nodule 04/21/2022   Lung nodule seen on imaging study 08/14/2021   Added automatically from request for surgery [id number]   Myelopathy ([facility]) 12/21/2016   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   Neuromuscular disorder ([facility])    tingling toes   Paraparesis ([facility]) 12/15/2016   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  MEDICATIONS:  Current Outpatient Medications  Medication Sig Dispense Refill   amLODipine (NORVASC) 10 MG tablet Take 1 tablet (10 mg total) by mouth daily. For blood pressure 90 tablet 3   atorvastatin (LIPITOR) 40 MG tablet Take 1 tablet (40 mg total) by mouth daily. For cholesterol. 90 tablet 3   baclofen (LIORESAL) 10 MG tablet  Take 1 tablet (10 mg total) by mouth at bedtime. For muscle spasms. 90 each 2   Biotin 5000 MCG CAPS Take 5,000 mcg by mouth every morning.      cetirizine (ZYRTEC) 10 MG tablet Take 1 tablet (10 mg total) by mouth daily. For allergies 90 tablet 0   Cholecalciferol (VITAMIN D) 50 MCG (2000 UT) tablet Take 2,000 Units by mouth daily.     Coenzyme Q10 (COQ10) 100 MG CAPS Take 100 mg by mouth every evening.     dexamethasone (DECADRON) 4 MG tablet 4 mg p.o. twice daily the day before, day of and day after  chemotherapy every 3 weeks 30 tablet 0   esomeprazole (NEXIUM 24HR) 20 MG capsule Take 1 capsule (20 mg total) by mouth daily at 12 noon. 90 capsule 1   fluocinonide cream (LIDEX) 8.29 % Apply 1 Application topically 2 (two) times daily as needed (irritation).     folic acid (FOLVITE) 1 MG tablet Take 1 tablet (1 mg total) by mouth daily. 30 tablet 4   gabapentin (NEURONTIN) 300 MG capsule TAKE 1 CAPSULE(300 MG) BY MOUTH TWICE DAILY FOR PAIN 180 capsule 2   ibuprofen (ADVIL) 200 MG tablet Take 600 mg by mouth every 8 (eight) hours as needed (pain). (Patient not taking: Reported on 08/11/2022)     lisinopril (ZESTRIL) 20 MG tablet TAKE 1 TABLET(20 MG) BY MOUTH DAILY FOR BLOOD PRESSURE 90 tablet 3   metFORMIN (GLUCOPHAGE) 1000 MG tablet Take 1 tablet (1,000 mg total) by mouth 2 (two) times daily with a meal. for diabetes. 180 tablet 0   nystatin-triamcinolone ointment (MYCOLOG) Apply 1 Application topically 2 (two) times daily as needed (irritation).     oxybutynin (DITROPAN-XL) 5 MG 24 hr tablet TAKE 1 TABLET(5 MG) BY MOUTH AT BEDTIME FOR OVERACTIVE BLADDER 90 tablet 0   oxybutynin (OXYTROL) 3.9 MG/24HR Place 1 patch onto the skin See admin instructions. Change every 5 days     Polyethyl Glycol-Propyl Glycol (LUBRICANT EYE DROPS) 0.4-0.3 % SOLN Place 1-2 drops into both eyes 2 (two) times daily.     prochlorperazine (COMPAZINE) 10 MG tablet Take 1 tablet (10 mg total) by mouth every 6 (six) hours as needed for nausea or vomiting. 30 tablet 0   senna-docusate (SENOKOT-S) 8.6-50 MG tablet Take 2 tablets by mouth 2 (two) times daily. (Patient taking differently: Take 1 tablet by mouth every other day.)     No current facility-administered medications for this visit.    SURGICAL HISTORY:  Past Surgical History:  Procedure Laterality Date   ABDOMINAL EXPOSURE N/A 07/26/2017   Procedure: ABDOMINAL EXPOSURE;  Surgeon: Angelia Mould, MD;  Location: [facility];  Service: Vascular;  Laterality: N/A;    ANTERIOR CERVICAL DECOMP/DISCECTOMY FUSION  2001   ANTERIOR LUMBAR FUSION N/A 07/26/2017   Procedure: Lumbar five-Sacral one Anterior lumbar interbody fusion with Dr. Deitra Mayo for approach;  Surgeon: Ditty, Kevan Ny, MD;  Location: [facility];  Service: Neurosurgery;  Laterality: N/A;   APPLICATION OF ROBOTIC ASSISTANCE FOR SPINAL PROCEDURE  12/19/2016   Procedure: APPLICATION OF ROBOTIC ASSISTANCE FOR SPINAL PROCEDURE;  Surgeon: Kevan Ny Ditty, MD;  Location: [facility];  Service: Neurosurgery;;   APPLICATION OF ROBOTIC ASSISTANCE FOR SPINAL PROCEDURE N/A 07/26/2017   Procedure: APPLICATION OF ROBOTIC ASSISTANCE FOR SPINAL PROCEDURE;  Surgeon: Ditty, Kevan Ny, MD;  Location: [facility];  Service: Neurosurgery;  Laterality: N/A;   APPLICATION OF ROBOTIC ASSISTANCE FOR SPINAL PROCEDURE N/A 09/02/2017   Procedure: APPLICATION OF ROBOTIC  ASSISTANCE FOR SPINAL PROCEDURE;  Surgeon: Ditty, Kevan Ny, MD;  Location: [facility];  Service: Neurosurgery;  Laterality: N/A;   BACK SURGERY     BLADDER SUSPENSION  1991   tack   BRONCHIAL BIOPSY  09/15/2021   Procedure: BRONCHIAL BIOPSIES;  Surgeon: Garner Nash, DO;  Location: [facility];  Service: Pulmonary;;   BRONCHIAL BIOPSY  05/04/2022   Procedure: BRONCHIAL BIOPSIES;  Surgeon: Garner Nash, DO;  Location: [facility];  Service: Pulmonary;;   BRONCHIAL BRUSHINGS  09/15/2021   Procedure: BRONCHIAL BRUSHINGS;  Surgeon: Garner Nash, DO;  Location: [facility];  Service: Pulmonary;;   BRONCHIAL NEEDLE ASPIRATION BIOPSY  09/15/2021   Procedure: BRONCHIAL NEEDLE ASPIRATION BIOPSIES;  Surgeon: Garner Nash, DO;  Location: [facility];  Service: Pulmonary;;   BRONCHIAL NEEDLE ASPIRATION BIOPSY  05/04/2022   Procedure: BRONCHIAL NEEDLE ASPIRATION BIOPSIES;  Surgeon: Garner Nash, DO;  Location: [facility];  Service: Pulmonary;;   BRONCHIAL WASHINGS  09/15/2021   Procedure: BRONCHIAL WASHINGS;  Surgeon: Garner Nash, DO;   Location: [facility];  Service: Pulmonary;;   FIDUCIAL MARKER PLACEMENT  09/15/2021   Procedure: FIDUCIAL MARKER PLACEMENT;  Surgeon: Garner Nash, DO;  Location: [facility];  Service: Pulmonary;;   INGUINAL HERNIA REPAIR Right 1991   INTERCOSTAL NERVE BLOCK Left 06/16/2022   Procedure: INTERCOSTAL NERVE BLOCK;  Surgeon: [name], MD;  Location: [facility];  Service: Thoracic;  Laterality: Left;   LUMBAR FUSION  2016; 2017; 07/26/2017   L4-5; L2-3; L5-S1   LYMPH NODE DISSECTION Left 06/16/2022   Procedure: LYMPH NODE DISSECTION;  Surgeon: [name], MD;  Location: [facility];  Service: Thoracic;  Laterality: Left;   POSTERIOR LUMBAR FUSION 4 WITH HARDWARE REMOVAL N/A 09/02/2017   Procedure: Lumbar three-four redo laminectomy; Repositioning of Left Sacral two screw; Extension of lumbar fusion to Thoracic twelve;  Surgeon: Ditty, Kevan Ny, MD;  Location: [facility];  Service: Neurosurgery;  Laterality: N/A;   TONSILLECTOMY AND ADENOIDECTOMY  1959   TUMOR EXCISION     WERTHIN'S TUMORS BOTH SIDES OF NECK   VAGINAL HYSTERECTOMY  1979   VIDEO BRONCHOSCOPY WITH RADIAL ENDOBRONCHIAL ULTRASOUND  09/15/2021   Procedure: VIDEO BRONCHOSCOPY WITH RADIAL ENDOBRONCHIAL ULTRASOUND;  Surgeon: Garner Nash, DO;  Location: [facility];  Service: Pulmonary;;   VIDEO BRONCHOSCOPY WITH RADIAL ENDOBRONCHIAL ULTRASOUND  05/04/2022   Procedure: VIDEO BRONCHOSCOPY WITH RADIAL ENDOBRONCHIAL ULTRASOUND;  Surgeon: Garner Nash, DO;  Location: [facility];  Service: Pulmonary;;    REVIEW OF SYSTEMS:  A comprehensive review of systems was negative except for: Constitutional: positive for fatigue   PHYSICAL EXAMINATION: General appearance: alert, cooperative, fatigued, and no distress Head: Normocephalic, without obvious abnormality, atraumatic Neck: no adenopathy, no JVD, supple, symmetrical, trachea midline, and thyroid not enlarged, symmetric, no tenderness/mass/nodules Lymph nodes: Cervical,  supraclavicular, and axillary nodes normal. Resp: clear to auscultation bilaterally Back: symmetric, no curvature. ROM normal. No CVA tenderness. Cardio: regular rate and rhythm, S1, S2 normal, no murmur, click, rub or gallop GI: soft, non-tender; bowel sounds normal; no masses,  no organomegaly Extremities: extremities normal, atraumatic, no cyanosis or edema  ECOG PERFORMANCE STATUS: 1 - Symptomatic but completely ambulatory  Blood pressure 138/74, pulse 82, temperature 98.2 F (36.8 C), temperature source Oral, resp. rate 19, weight 161 lb 9 oz (73.3 kg), SpO2 98 %.  LABORATORY DATA: Lab Results  Component Value Date   WBC 5.6 08/14/2022   HGB 12.7 08/14/2022   HCT 39.4  08/14/2022   MCV 89.7 08/14/2022   PLT 180 08/14/2022      Chemistry      Component Value Date/Time   NA 139 08/14/2022 1420   K 4.1 08/14/2022 1420   CL 102 08/14/2022 1420   CO2 26 08/14/2022 1420   BUN 19 08/14/2022 1420   CREATININE 0.81 08/14/2022 1420   CREATININE 0.85 08/09/2022 1107   CREATININE 0.84 07/31/2021 1600      Component Value Date/Time   CALCIUM 9.2 08/14/2022 1420   ALKPHOS 77 08/14/2022 1420   AST 22 08/14/2022 1420   AST 12 (L) 08/09/2022 1107   ALT 28 08/14/2022 1420   ALT 15 08/09/2022 1107   BILITOT 0.8 08/14/2022 1420   BILITOT 0.6 08/09/2022 1107       RADIOGRAPHIC STUDIES: DG Chest 2 View  Result Date: 08/14/2022 CLINICAL DATA:  Cough EXAM: CHEST - 2 VIEW COMPARISON:  07/30/2022 FINDINGS: Left-sided pleural effusion does not appear significantly changed compared to the most recent prior study. Left lung above the effusion and right lung are clear. No pneumothorax. Normal pulmonary vasculature. Thoracolumbar spine fixation hardware noted. IMPRESSION: Stable appearance of the chest with a moderate left-sided pleural effusion. Electronically Signed   By: Sammie Bench M.D.   On: 08/14/2022 16:04   DG Chest 2 View  Result Date: 07/30/2022 CLINICAL DATA:  Malignant  neoplasm of lung. Status post left lobectomy 06/2022. EXAM: CHEST - 2 VIEW COMPARISON:  Chest two views 06/21/2022, 06/20/2022; CT chest 03/22/2022 FINDINGS: Interval removal of left-sided chest tube. The prior small left apical pneumothorax is no longer visualized. New small left pleural effusion. Mild left basilar subsegmental atelectasis. The right lung is clear. No right pleural effusion. Resolution of the prior left chest wall subcutaneous air from the left chest tube. Moderate multilevel degenerative disc changes of the thoracic spine. Partial visualization of lower thoracic and upper lumbar posterior fusion hardware. Lower cervical spine ACDF hardware. IMPRESSION: 1. Interval removal of left-sided chest tube. The prior small left apical pneumothorax is no longer visualized. 2. New small left pleural effusion. Mild left basilar subsegmental atelectasis. Electronically Signed   By: Yvonne Kendall M.D.   On: 07/30/2022 10:53    ASSESSMENT AND PLAN: This is a very pleasant 71 years old white female with Stage IIb (T3, N0, M0)  non-small cell lung cancer, adenocarcinoma presented with large left lower lobe lung mass diagnosed in August 2023 status post left lower lobectomy with lymph node sampling under the care of Dr. Kipp Brood on June 16, 2022 with tumor size of 5.5 cm and visceropleural involvement. Molecular studies by [facility] 1 showed no actionable mutation and she has negative PD-L1 expression. The patient started adjuvant systemic chemotherapy with carboplatin for AUC of 5 and Alimta 500 Mg/M2 status post 1 cycle started on August 09, 2022.  She tolerated the first week of her treatment fairly well with no concerning adverse effect except for fatigue started few days after the treatment and she is feeling much better now. Her lab work 2 days ago was unremarkable. I recommended for the patient to continue her current adjuvant therapy and she is expected to start the second cycle in 2 weeks. The  patient was advised to call immediately if she has any other concerning symptoms in the interval. The patient voices understanding of current disease status and treatment options and is in agreement with the current care plan.  All questions were answered. The patient knows to call the clinic with any problems,  questions or concerns. We can certainly see the patient much sooner if necessary.  The total time spent in the appointment was 20 minutes.  Disclaimer: This note was dictated with voice recognition software. Similar sounding words can inadvertently be transcribed and may not be corrected upon review.

## 2022-08-16 NOTE — Addendum Note (Signed)
Addended by: Ardeen Garland on: 08/16/2022 10:09 AM   Modules accepted: Orders

## 2022-08-18 ENCOUNTER — Telehealth: Payer: Self-pay | Admitting: Primary Care

## 2022-08-18 DIAGNOSIS — E1165 Type 2 diabetes mellitus with hyperglycemia: Secondary | ICD-10-CM

## 2022-08-18 MED ORDER — METFORMIN HCL 1000 MG PO TABS: 500.0000 mg | ORAL_TABLET | Freq: Two times a day (BID) | ORAL | 0 refills | Status: DC

## 2022-08-18 MED ORDER — GLIPIZIDE 5 MG PO TABS: 5.0000 mg | ORAL_TABLET | Freq: Two times a day (BID) | ORAL | 0 refills | Status: DC

## 2022-08-18 NOTE — Telephone Encounter (Signed)
Noted  

## 2022-08-18 NOTE — Telephone Encounter (Signed)
Noted.   Did she have any issues with the metformin at the 500 mg dose twice daily?  This is what she was taking before we increased her dose to 1000 mg twice daily.  If she could tolerate the metformin 500 mg twice daily then I recommend we resume that and add in glipizide 5 mg twice daily for diabetes.  It may take a few weeks for her to notice consistent improvement in her sugars.

## 2022-08-18 NOTE — Telephone Encounter (Signed)
Patient called back in to let us know she is able to spilt the 1000 mg metformin pill in half, it is scored. She is not in need of a refill of 500 mg metformin at this time.

## 2022-08-18 NOTE — Telephone Encounter (Signed)
Patient called in and stated that her Metformin was increased to 1000 MG last week when she came into the office. She stated since then she has been having diarrhea, fatigue, tired, and nauseous. She wanting to know if something else could be sent in for her because it isn't controlling her blood sugar like it should be. Please advise. Thank you!

## 2022-08-18 NOTE — Telephone Encounter (Signed)
Called and spoke to patient, she confirmed she has been taking Metformin 1000mg  twice daily as instructed at her visit on 08/11/22. Since increasing the dose it has given her diarrhea, nausea and fatigue. Her FBS have been ranging 150-180. Please advise.

## 2022-08-18 NOTE — Addendum Note (Signed)
Addended by: Pleas Koch on: 08/18/2022 04:07 PM   Modules accepted: Orders

## 2022-08-18 NOTE — Telephone Encounter (Signed)
Called and spoke with patient, reviewed all information. She states she did not have any issues with the metformin 500 mg dose twice daily. She is agreeable to resuming that dosage and adding in glipizide 5mg  twice daily.   Advised you would send to pharmacy.

## 2022-08-20 ENCOUNTER — Other Ambulatory Visit: Payer: Self-pay | Admitting: Medical Oncology

## 2022-08-20 ENCOUNTER — Other Ambulatory Visit: Payer: Self-pay | Admitting: Primary Care

## 2022-08-20 DIAGNOSIS — E119 Type 2 diabetes mellitus without complications: Secondary | ICD-10-CM

## 2022-08-20 DIAGNOSIS — Z5111 Encounter for antineoplastic chemotherapy: Secondary | ICD-10-CM

## 2022-08-23 ENCOUNTER — Inpatient Hospital Stay: Payer: PPO

## 2022-08-23 ENCOUNTER — Other Ambulatory Visit: Payer: Self-pay | Admitting: Primary Care

## 2022-08-23 DIAGNOSIS — E1165 Type 2 diabetes mellitus with hyperglycemia: Secondary | ICD-10-CM

## 2022-08-23 DIAGNOSIS — Z5111 Encounter for antineoplastic chemotherapy: Secondary | ICD-10-CM

## 2022-08-23 LAB — CBC WITH DIFFERENTIAL (CANCER CENTER ONLY)
Abs Immature Granulocytes: 0.01 10*3/uL (ref 0.00–0.07)
Basophils Absolute: 0 10*3/uL (ref 0.0–0.1)
Basophils Relative: 0 %
Eosinophils Absolute: 0 10*3/uL (ref 0.0–0.5)
Eosinophils Relative: 1 %
HCT: 32.8 % — ABNORMAL LOW (ref 36.0–46.0)
Hemoglobin: 11 g/dL — ABNORMAL LOW (ref 12.0–15.0)
Immature Granulocytes: 0 %
Lymphocytes Relative: 24 %
Lymphs Abs: 0.8 10*3/uL (ref 0.7–4.0)
MCH: 29.6 pg (ref 26.0–34.0)
MCHC: 33.5 g/dL (ref 30.0–36.0)
MCV: 88.2 fL (ref 80.0–100.0)
Monocytes Absolute: 0.4 10*3/uL (ref 0.1–1.0)
Monocytes Relative: 12 %
Neutro Abs: 2.2 10*3/uL (ref 1.7–7.7)
Neutrophils Relative %: 63 %
Platelet Count: 116 10*3/uL — ABNORMAL LOW (ref 150–400)
RBC: 3.72 MIL/uL — ABNORMAL LOW (ref 3.87–5.11)
RDW: 13.9 % (ref 11.5–15.5)
WBC Count: 3.4 10*3/uL — ABNORMAL LOW (ref 4.0–10.5)
nRBC: 0 % (ref 0.0–0.2)

## 2022-08-23 LAB — CMP (CANCER CENTER ONLY)
ALT: 26 U/L (ref 0–44)
AST: 15 U/L (ref 15–41)
Albumin: 3.8 g/dL (ref 3.5–5.0)
Alkaline Phosphatase: 78 U/L (ref 38–126)
Anion gap: 7 (ref 5–15)
BUN: 9 mg/dL (ref 8–23)
CO2: 29 mmol/L (ref 22–32)
Calcium: 9.2 mg/dL (ref 8.9–10.3)
Chloride: 104 mmol/L (ref 98–111)
Creatinine: 0.75 mg/dL (ref 0.44–1.00)
GFR, Estimated: >60 mL/min (ref >60)
Glucose, Bld: 164 mg/dL — ABNORMAL HIGH (ref 70–99)
Potassium: 4.2 mmol/L (ref 3.5–5.1)
Sodium: 140 mmol/L (ref 135–145)
Total Bilirubin: 0.4 mg/dL (ref 0.3–1.2)
Total Protein: 6.4 g/dL — ABNORMAL LOW (ref 6.5–8.1)

## 2022-08-25 ENCOUNTER — Telehealth: Payer: Self-pay | Admitting: Medical Oncology

## 2022-08-25 NOTE — Telephone Encounter (Signed)
Reviewed CBC/diff with pt . Can she drink a diet coke occasionally? I told her it is ok occasionally.

## 2022-08-28 NOTE — Progress Notes (Unsigned)
Megan [name] OFFICE PROGRESS NOTE  Megan Koch, NP [place] [address]  DIAGNOSIS: Stage IIb (T3, N0, M0)  non-small cell lung cancer, adenocarcinoma presented with large left lower lobe lung mass diagnosed in August 2023  Molecular studies by foundation 1 showed no actionable mutation and she has negative PD-L1 expression.  PRIOR THERAPY: Status post left lower lobectomy with lymph node sampling under the care of Dr. Kipp Brood on June 16, 2022 with tumor size of 5.5 cm and visceropleural involvement.   CURRENT THERAPY: Adjuvant systemic chemotherapy with carboplatin for AUC of 5 and Alimta 500 Mg/M2. First dose August 09, 2022. Status post 1 cycle.   INTERVAL HISTORY: Megan [name] 71 y.o. female returns to the clinic for a follow up visit. The patient is feeling well today without any concerning complaints. She is currently undergoing adjuvant chemotherapy. She is status post 1 cycle and tolerated it well except for fatigue. She did go to the ER for weakness following treatment but felt it was secondary to steroid cessation. She is going try to take 1 tablet in the AM on days 4 and 5. The patient continues to tolerate treatment with chemotherapy well otherwise. Denies any fever, night sweats, or weight loss. She reports only mild dyspnea on exertion but "not much". Denies cough. She has improving chest discomfort from her surgery. Denies any nausea, vomiting, diarrhea, or constipation. Denies any headache or visual changes except she has cataracts so her baseline vision is not good. She was supposed to get cataract surgery before her diagnosis. She is going to try to reschedule. 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   Constipation due to pain medication    Debility    Depression    Dyspnea    W/ PHYS CONDITION    Emphysema of lung ([facility])    Foul smelling urine 07/22/2020   GERD (gastroesophageal reflux disease)    History of bronchitis    "not since I quit smoking" (07/26/2017)   History of kidney stones    History of shingles    Hyperlipidemia    takes Fish Oil daily   Hypertension    Hypoalbuminemia due to protein-calorie malnutrition ([facility])    L3 vertebral fracture ([facility]) 09/01/2017   Lung cancer ([facility]) 2023   Lung nodule 04/21/2022   Lung nodule seen on imaging study 08/14/2021   Added automatically from request for surgery [id number]   Myelopathy ([facility]) 12/21/2016   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  MEDICATIONS:  Current Outpatient Medications  Medication Sig Dispense Refill  acetaminophen (TYLENOL) 500 MG tablet Take 500 mg by mouth every 8 (eight) hours as needed (for pain).     amLODipine (NORVASC) 10 MG tablet Take 1 tablet (10 mg total) by mouth daily. For blood pressure 90 tablet 3   atorvastatin (LIPITOR) 40 MG tablet Take 1 tablet (40 mg total) by mouth daily. For cholesterol. 90 tablet 3   baclofen (LIORESAL) 10 MG tablet Take 1 tablet (10 mg total) by mouth at bedtime. For muscle spasms. 90 each 2   Biotin 5000 MCG CAPS Take 5,000 mcg by mouth every  morning.      cetirizine (ZYRTEC) 10 MG tablet Take 1 tablet (10 mg total) by mouth daily. For allergies 90 tablet 0   Cholecalciferol (VITAMIN D) 50 MCG (2000 UT) tablet Take 2,000 Units by mouth daily.     Coenzyme Q10 (COQ10) 100 MG CAPS Take 100 mg by mouth every evening.     dexamethasone (DECADRON) 4 MG tablet 4 mg p.o. twice daily the day before, day of and day after chemotherapy every 3 weeks 30 tablet 0   esomeprazole (NEXIUM 24HR) 20 MG capsule Take 1 capsule (20 mg total) by mouth daily at 12 noon. 90 capsule 1   fluocinonide cream (LIDEX) 8.50 % Apply 1 Application topically 2 (two) times daily as needed (irritation).     folic acid (FOLVITE) 1 MG tablet Take 1 tablet (1 mg total) by mouth daily. 30 tablet 4   gabapentin (NEURONTIN) 300 MG capsule TAKE 1 CAPSULE(300 MG) BY MOUTH TWICE DAILY FOR PAIN 180 capsule 2   glipiZIDE (GLUCOTROL) 5 MG tablet Take 1 tablet (5 mg total) by mouth 2 (two) times daily before a meal. for diabetes. 180 tablet 0   lisinopril (ZESTRIL) 20 MG tablet TAKE 1 TABLET(20 MG) BY MOUTH DAILY FOR BLOOD PRESSURE 90 tablet 3   metFORMIN (GLUCOPHAGE) 1000 MG tablet Take 0.5 tablets (500 mg total) by mouth 2 (two) times daily with a meal. for diabetes. 90 tablet 0   nystatin-triamcinolone ointment (MYCOLOG) Apply 1 Application topically 2 (two) times daily as needed (irritation).     oxybutynin (DITROPAN-XL) 5 MG 24 hr tablet TAKE 1 TABLET(5 MG) BY MOUTH AT BEDTIME FOR OVERACTIVE BLADDER 90 tablet 0   oxybutynin (OXYTROL) 3.9 MG/24HR Place 1 patch onto the skin See admin instructions. Change every 5 days     Polyethyl Glycol-Propyl Glycol (LUBRICANT EYE DROPS) 0.4-0.3 % SOLN Place 1-2 drops into both eyes 2 (two) times daily.     prochlorperazine (COMPAZINE) 10 MG tablet Take 1 tablet (10 mg total) by mouth every 6 (six) hours as needed for nausea or vomiting. 30 tablet 0   senna-docusate (SENOKOT-S) 8.6-50 MG tablet Take 2 tablets by mouth 2 (two) times daily.  (Patient taking differently: Take 1 tablet by mouth every other day.)     No current facility-administered medications for this visit.    SURGICAL HISTORY:  Past Surgical History:  Procedure Laterality Date   ABDOMINAL EXPOSURE N/A 07/26/2017   Procedure: ABDOMINAL EXPOSURE;  Surgeon: Angelia Mould, MD;  Location: [facility];  Service: Vascular;  Laterality: N/A;   ANTERIOR CERVICAL DECOMP/DISCECTOMY FUSION  2001   ANTERIOR LUMBAR FUSION N/A 07/26/2017   Procedure: Lumbar five-Sacral one Anterior lumbar interbody fusion with Dr. Deitra Mayo for approach;  Surgeon: Ditty, Kevan Ny, MD;  Location: [facility];  Service: Neurosurgery;  Laterality: N/A;   APPLICATION OF ROBOTIC ASSISTANCE FOR SPINAL PROCEDURE  12/19/2016   Procedure: APPLICATION OF ROBOTIC ASSISTANCE  FOR SPINAL PROCEDURE;  Surgeon: Kevan Ny Ditty, MD;  Location: [facility];  Service: Neurosurgery;;   APPLICATION OF ROBOTIC ASSISTANCE FOR SPINAL PROCEDURE N/A 07/26/2017   Procedure: APPLICATION OF ROBOTIC ASSISTANCE FOR SPINAL PROCEDURE;  Surgeon: Ditty, Kevan Ny, MD;  Location: [facility];  Service: Neurosurgery;  Laterality: N/A;   APPLICATION OF ROBOTIC ASSISTANCE FOR SPINAL PROCEDURE N/A 09/02/2017   Procedure: APPLICATION OF ROBOTIC ASSISTANCE FOR SPINAL PROCEDURE;  Surgeon: Ditty, Kevan Ny, MD;  Location: [facility];  Service: Neurosurgery;  Laterality: N/A;   BACK SURGERY     BLADDER SUSPENSION  1991   tack   BRONCHIAL BIOPSY  09/15/2021   Procedure: BRONCHIAL BIOPSIES;  Surgeon: Garner Nash, DO;  Location: [facility];  Service: Pulmonary;;   BRONCHIAL BIOPSY  05/04/2022   Procedure: BRONCHIAL BIOPSIES;  Surgeon: Garner Nash, DO;  Location: [facility];  Service: Pulmonary;;   BRONCHIAL BRUSHINGS  09/15/2021   Procedure: BRONCHIAL BRUSHINGS;  Surgeon: Garner Nash, DO;  Location: [facility];  Service: Pulmonary;;   BRONCHIAL NEEDLE ASPIRATION BIOPSY  09/15/2021   Procedure: BRONCHIAL NEEDLE  ASPIRATION BIOPSIES;  Surgeon: Garner Nash, DO;  Location: [facility];  Service: Pulmonary;;   BRONCHIAL NEEDLE ASPIRATION BIOPSY  05/04/2022   Procedure: BRONCHIAL NEEDLE ASPIRATION BIOPSIES;  Surgeon: Garner Nash, DO;  Location: [facility];  Service: Pulmonary;;   BRONCHIAL WASHINGS  09/15/2021   Procedure: BRONCHIAL WASHINGS;  Surgeon: Garner Nash, DO;  Location: [facility];  Service: Pulmonary;;   FIDUCIAL MARKER PLACEMENT  09/15/2021   Procedure: FIDUCIAL MARKER PLACEMENT;  Surgeon: Garner Nash, DO;  Location: [facility];  Service: Pulmonary;;   INGUINAL HERNIA REPAIR Right 1991   INTERCOSTAL NERVE BLOCK Left 06/16/2022   Procedure: INTERCOSTAL NERVE BLOCK;  Surgeon: [name], MD;  Location: [facility];  Service: Thoracic;  Laterality: Left;   LUMBAR FUSION  2016; 2017; 07/26/2017   L4-5; L2-3; L5-S1   LYMPH NODE DISSECTION Left 06/16/2022   Procedure: LYMPH NODE DISSECTION;  Surgeon: [name], MD;  Location: [facility];  Service: Thoracic;  Laterality: Left;   POSTERIOR LUMBAR FUSION 4 WITH HARDWARE REMOVAL N/A 09/02/2017   Procedure: Lumbar three-four redo laminectomy; Repositioning of Left Sacral two screw; Extension of lumbar fusion to Thoracic twelve;  Surgeon: Ditty, Kevan Ny, MD;  Location: [facility];  Service: Neurosurgery;  Laterality: N/A;   TONSILLECTOMY AND ADENOIDECTOMY  1959   TUMOR EXCISION     WERTHIN'S TUMORS BOTH SIDES OF NECK   VAGINAL HYSTERECTOMY  1979   VIDEO BRONCHOSCOPY WITH RADIAL ENDOBRONCHIAL ULTRASOUND  09/15/2021   Procedure: VIDEO BRONCHOSCOPY WITH RADIAL ENDOBRONCHIAL ULTRASOUND;  Surgeon: Garner Nash, DO;  Location: [facility];  Service: Pulmonary;;   VIDEO BRONCHOSCOPY WITH RADIAL ENDOBRONCHIAL ULTRASOUND  05/04/2022   Procedure: VIDEO BRONCHOSCOPY WITH RADIAL ENDOBRONCHIAL ULTRASOUND;  Surgeon: Garner Nash, DO;  Location: [facility];  Service: Pulmonary;;    REVIEW OF SYSTEMS:   Review of Systems   Constitutional: Negative for appetite change, chills, fatigue, fever and unexpected weight change.  HENT:   Negative for mouth sores, nosebleeds, sore throat and trouble swallowing.   Eyes: Negative for eye problems and icterus.  Respiratory: Negative for cough, hemoptysis, shortness of breath and wheezing.   Cardiovascular: Negative for chest pain and leg swelling.  Gastrointestinal: Negative for abdominal pain, constipation, diarrhea, nausea and vomiting.  Genitourinary: Negative for bladder incontinence, difficulty urinating, dysuria, frequency and hematuria.   Musculoskeletal: Negative for back pain, gait  problem, neck pain and neck stiffness.  Skin: Negative for itching and rash.  Neurological: Negative for dizziness, extremity weakness, gait problem, headaches, light-headedness and seizures.  Hematological: Negative for adenopathy. Does not bruise/bleed easily.  Psychiatric/Behavioral: Negative for confusion, depression and sleep disturbance. The patient is not nervous/anxious.     PHYSICAL EXAMINATION:  Blood pressure (!) 154/78, pulse 85, temperature 97.9 F (36.6 C), temperature source Temporal, resp. rate 16, weight 163 lb 1.6 oz (74 kg), SpO2 100 %.  ECOG PERFORMANCE STATUS: 1  Physical Exam  Constitutional: Oriented to person, place, and time and well-developed, well-nourished, and in no distress.  HENT:  Head: Normocephalic and atraumatic.  Mouth/Throat: Oropharynx is clear and moist. No oropharyngeal exudate.  Eyes: Conjunctivae are normal. Right eye exhibits no discharge. Left eye exhibits no discharge. No scleral icterus.  Neck: Normal range of motion. Neck supple.  Cardiovascular: Normal rate, regular rhythm, normal heart sounds and intact distal pulses.   Pulmonary/Chest: Effort normal and breath sounds normal. No respiratory distress. No wheezes. No rales.  Abdominal: Soft. Bowel sounds are normal. Exhibits no distension and no mass. There is no tenderness.   Musculoskeletal: Normal range of motion. Exhibits no edema.  Lymphadenopathy:    No cervical adenopathy.  Neurological: Alert and oriented to person, place, and time. Exhibits normal muscle tone. Gait normal. Coordination normal.  Skin: Skin is warm and dry. No rash noted. Not diaphoretic. No erythema. No pallor.  Psychiatric: Mood, memory and judgment normal.  Vitals reviewed.  LABORATORY DATA: Lab Results  Component Value Date   WBC 5.2 08/31/2022   HGB 12.0 08/31/2022   HCT 35.5 (L) 08/31/2022   MCV 87.2 08/31/2022   PLT 306 08/31/2022      Chemistry      Component Value Date/Time   NA 140 08/23/2022 1335   K 4.2 08/23/2022 1335   CL 104 08/23/2022 1335   CO2 29 08/23/2022 1335   BUN 9 08/23/2022 1335   CREATININE 0.75 08/23/2022 1335   CREATININE 0.84 07/31/2021 1600      Component Value Date/Time   CALCIUM 9.2 08/23/2022 1335   ALKPHOS 78 08/23/2022 1335   AST 15 08/23/2022 1335   ALT 26 08/23/2022 1335   BILITOT 0.4 08/23/2022 1335       RADIOGRAPHIC STUDIES:  DG Chest 2 View  Result Date: 08/14/2022 CLINICAL DATA:  Cough EXAM: CHEST - 2 VIEW COMPARISON:  07/30/2022 FINDINGS: Left-sided pleural effusion does not appear significantly changed compared to the most recent prior study. Left lung above the effusion and right lung are clear. No pneumothorax. Normal pulmonary vasculature. Thoracolumbar spine fixation hardware noted. IMPRESSION: Stable appearance of the chest with a moderate left-sided pleural effusion. Electronically Signed   By: Sammie Bench M.D.   On: 08/14/2022 16:04     ASSESSMENT/PLAN:  This is a very pleasant 71 year old Caucasian female with Stage IIb (T3, N0, M0)  non-small cell lung cancer, adenocarcinoma presented with large left lower lobe lung mass diagnosed in August 2023 status post left lower lobectomy with lymph node sampling under the care of Dr. Kipp Brood on June 16, 2022 with tumor size of 5.5 cm and visceropleural involvement.    Molecular studies by foundation 1 showed no actionable mutation and she has negative PD-L1 expression.   The patient started adjuvant systemic chemotherapy with carboplatin for AUC of 5 and Alimta 500 Mg/M2 status post 1 cycle started on August 09, 2022.   Labs were reviewed. Her CMP is pending. As  long as her CMP is within parameters, recommend that she proceed with cycle #2 as scheduled.   We will see her back for a follow up visit in 3 weeks for evaluation before starting cycle #3.   She is going to try to take 1 tablet of steroids on day 4 and 5 so she does not stop this abruptly since that is when she felt poorly with the last treatment. Overall, she is feeling well since that time.   She had questions about her weekly lab work. We reviewed this. We do expect some myelosuppression on labs. Discussed we would always call her if we needed to intervene.   The patient was advised to call immediately if she has any concerning symptoms in the interval. The patient voices understanding of current disease status and treatment options and is in agreement with the current care plan. All questions were answered. The patient knows to call the clinic with any problems, questions or concerns. We can certainly see the patient much sooner if necessary           No orders of the defined types were placed in this encounter.    The total time spent in the appointment was 20-29 minutes.   Megan [name] L Sirus Labrie, PA-C 08/31/22

## 2022-08-30 ENCOUNTER — Other Ambulatory Visit: Payer: PPO

## 2022-08-30 MED FILL — Dexamethasone Sodium Phosphate Inj 100 MG/10ML: INTRAMUSCULAR | Qty: 1 | Status: AC

## 2022-08-30 MED FILL — Fosaprepitant Dimeglumine For IV Infusion 150 MG (Base Eq): INTRAVENOUS | Qty: 5 | Status: AC

## 2022-08-31 ENCOUNTER — Inpatient Hospital Stay: Payer: PPO

## 2022-08-31 ENCOUNTER — Inpatient Hospital Stay (HOSPITAL_BASED_OUTPATIENT_CLINIC_OR_DEPARTMENT_OTHER): Payer: PPO | Admitting: Physician Assistant

## 2022-08-31 VITALS — BP 154/78 | HR 85 | Temp 97.9°F | Resp 16 | Wt 163.1 lb

## 2022-08-31 VITALS — BP 137/72 | HR 75 | Temp 98.2°F | Resp 18

## 2022-08-31 DIAGNOSIS — C3432 Malignant neoplasm of lower lobe, left bronchus or lung: Secondary | ICD-10-CM | POA: Diagnosis not present

## 2022-08-31 DIAGNOSIS — Z5111 Encounter for antineoplastic chemotherapy: Secondary | ICD-10-CM

## 2022-08-31 LAB — CMP (CANCER CENTER ONLY)
ALT: 28 U/L (ref 0–44)
AST: 26 U/L (ref 15–41)
Albumin: 4.1 g/dL (ref 3.5–5.0)
Alkaline Phosphatase: 84 U/L (ref 38–126)
Anion gap: 13 (ref 5–15)
BUN: 14 mg/dL (ref 8–23)
CO2: 21 mmol/L — ABNORMAL LOW (ref 22–32)
Calcium: 9.4 mg/dL (ref 8.9–10.3)
Chloride: 104 mmol/L (ref 98–111)
Creatinine: 0.72 mg/dL (ref 0.44–1.00)
GFR, Estimated: >60 mL/min (ref >60)
Glucose, Bld: 213 mg/dL — ABNORMAL HIGH (ref 70–99)
Potassium: 4.2 mmol/L (ref 3.5–5.1)
Sodium: 138 mmol/L (ref 135–145)
Total Bilirubin: 0.5 mg/dL (ref 0.3–1.2)
Total Protein: 7.6 g/dL (ref 6.5–8.1)

## 2022-08-31 LAB — CBC WITH DIFFERENTIAL (CANCER CENTER ONLY)
Abs Immature Granulocytes: 0.04 10*3/uL (ref 0.00–0.07)
Basophils Absolute: 0 10*3/uL (ref 0.0–0.1)
Basophils Relative: 0 %
Eosinophils Absolute: 0 10*3/uL (ref 0.0–0.5)
Eosinophils Relative: 0 %
HCT: 35.5 % — ABNORMAL LOW (ref 36.0–46.0)
Hemoglobin: 12 g/dL (ref 12.0–15.0)
Immature Granulocytes: 1 %
Lymphocytes Relative: 14 %
Lymphs Abs: 0.7 10*3/uL (ref 0.7–4.0)
MCH: 29.5 pg (ref 26.0–34.0)
MCHC: 33.8 g/dL (ref 30.0–36.0)
MCV: 87.2 fL (ref 80.0–100.0)
Monocytes Absolute: 0.7 10*3/uL (ref 0.1–1.0)
Monocytes Relative: 13 %
Neutro Abs: 3.8 10*3/uL (ref 1.7–7.7)
Neutrophils Relative %: 72 %
Platelet Count: 306 10*3/uL (ref 150–400)
RBC: 4.07 MIL/uL (ref 3.87–5.11)
RDW: 14.6 % (ref 11.5–15.5)
WBC Count: 5.2 10*3/uL (ref 4.0–10.5)
nRBC: 0 % (ref 0.0–0.2)

## 2022-08-31 MED ORDER — PALONOSETRON HCL INJECTION 0.25 MG/5ML
0.2500 mg | Freq: Once | INTRAVENOUS | Status: AC
  Administered 2022-08-31: 0.25 mg via INTRAVENOUS
  Filled 2022-08-31: qty 5

## 2022-08-31 MED ORDER — SODIUM CHLORIDE 0.9 % IV SOLN
440.0000 mg | Freq: Once | INTRAVENOUS | Status: AC
  Administered 2022-08-31: 440 mg via INTRAVENOUS
  Filled 2022-08-31: qty 44

## 2022-08-31 MED ORDER — SODIUM CHLORIDE 0.9 % IV SOLN: Freq: Once | INTRAVENOUS | Status: AC

## 2022-08-31 MED ORDER — SODIUM CHLORIDE 0.9 % IV SOLN
500.0000 mg/m2 | Freq: Once | INTRAVENOUS | Status: AC
  Administered 2022-08-31: 1000 mg via INTRAVENOUS
  Filled 2022-08-31: qty 40

## 2022-08-31 MED ORDER — SODIUM CHLORIDE 0.9 % IV SOLN
150.0000 mg | Freq: Once | INTRAVENOUS | Status: AC
  Administered 2022-08-31: 150 mg via INTRAVENOUS
  Filled 2022-08-31: qty 150

## 2022-08-31 MED ORDER — SODIUM CHLORIDE 0.9 % IV SOLN
10.0000 mg | Freq: Once | INTRAVENOUS | Status: AC
  Administered 2022-08-31: 10 mg via INTRAVENOUS
  Filled 2022-08-31: qty 10

## 2022-08-31 NOTE — Patient Instructions (Signed)
[facility]  Discharge Instructions: Thank you for choosing [facility] to provide your oncology and hematology care.   If you have a lab appointment with the [facility], please go directly to the [facility] and check in at the registration area.   Wear comfortable clothing and clothing appropriate for easy access to any Portacath or PICC line.   We strive to give you quality time with your provider. You may need to reschedule your appointment if you arrive late (15 or more minutes).  Arriving late affects you and other patients whose appointments are after yours.  Also, if you miss three or more appointments without notifying the office, you may be dismissed from the clinic at the provider's discretion.      For prescription refill requests, have your pharmacy contact our office and allow 72 hours for refills to be completed.    Today you received the following chemotherapy and/or immunotherapy agents: pemetrexed and carboplatin      To help prevent nausea and vomiting after your treatment, we encourage you to take your nausea medication as directed.  BELOW ARE SYMPTOMS THAT SHOULD BE REPORTED IMMEDIATELY: *FEVER GREATER THAN 100.4 F (38 C) OR HIGHER *CHILLS OR SWEATING *NAUSEA AND VOMITING THAT IS NOT CONTROLLED WITH YOUR NAUSEA MEDICATION *UNUSUAL SHORTNESS OF BREATH *UNUSUAL BRUISING OR BLEEDING *URINARY PROBLEMS (pain or burning when urinating, or frequent urination) *BOWEL PROBLEMS (unusual diarrhea, constipation, pain near the anus) TENDERNESS IN MOUTH AND THROAT WITH OR WITHOUT PRESENCE OF ULCERS (sore throat, sores in mouth, or a toothache) UNUSUAL RASH, SWELLING OR PAIN  UNUSUAL VAGINAL DISCHARGE OR ITCHING   Items with * indicate a potential emergency and should be followed up as soon as possible or go to the Emergency Department if any problems should occur.  Please show the CHEMOTHERAPY ALERT CARD or IMMUNOTHERAPY ALERT  CARD at check-in to the Emergency Department and triage nurse.  Should you have questions after your visit or need to cancel or reschedule your appointment, please contact [facility]  Dept: [phone]  and follow the prompts.  Office hours are 8:00 a.m. to 4:30 p.m. Monday - Friday. Please note that voicemails left after 4:00 p.m. may not be returned until the following business day.  We are closed weekends and major holidays. You have access to a nurse at all times for urgent questions. Please call the main number to the clinic Dept: [phone] and follow the prompts.   For any non-urgent questions, you may also contact your provider using MyChart. We now offer e-Visits for anyone 21 and older to request care online for non-urgent symptoms. For details visit [email or web address].   Also download the MyChart app! Go to the app store, search "MyChart", open the app, select [facility], and log in with your MyChart username and password.  Masks are optional in the cancer centers. If you would like for your care team to wear a mask while they are taking care of you, please let them know. You may have one support person who is at least 69 years old accompany you for your appointments.

## 2022-09-03 ENCOUNTER — Telehealth: Payer: Self-pay

## 2022-09-03 NOTE — Telephone Encounter (Signed)
This nurse received a message from this patient stating that the past few nights she is not able to sleep.  She states that she is only getting 2-3 hours.  She would like to know if something can be called in for her or if an over the counter recommendation can be made.  No further questions or concerns noted.

## 2022-09-07 ENCOUNTER — Other Ambulatory Visit: Payer: Self-pay | Admitting: Primary Care

## 2022-09-07 ENCOUNTER — Inpatient Hospital Stay: Payer: PPO

## 2022-09-07 DIAGNOSIS — E785 Hyperlipidemia, unspecified: Secondary | ICD-10-CM

## 2022-09-07 DIAGNOSIS — N3281 Overactive bladder: Secondary | ICD-10-CM

## 2022-09-08 ENCOUNTER — Encounter: Payer: Self-pay | Admitting: Internal Medicine

## 2022-09-09 ENCOUNTER — Inpatient Hospital Stay: Payer: PPO

## 2022-09-09 DIAGNOSIS — Z5111 Encounter for antineoplastic chemotherapy: Secondary | ICD-10-CM | POA: Diagnosis not present

## 2022-09-09 LAB — CMP (CANCER CENTER ONLY)
ALT: 29 U/L (ref 0–44)
AST: 19 U/L (ref 15–41)
Albumin: 4 g/dL (ref 3.5–5.0)
Alkaline Phosphatase: 86 U/L (ref 38–126)
Anion gap: 8 (ref 5–15)
BUN: 12 mg/dL (ref 8–23)
CO2: 26 mmol/L (ref 22–32)
Calcium: 8.6 mg/dL — ABNORMAL LOW (ref 8.9–10.3)
Chloride: 104 mmol/L (ref 98–111)
Creatinine: 0.75 mg/dL (ref 0.44–1.00)
GFR, Estimated: >60 mL/min (ref >60)
Glucose, Bld: 142 mg/dL — ABNORMAL HIGH (ref 70–99)
Potassium: 4.4 mmol/L (ref 3.5–5.1)
Sodium: 138 mmol/L (ref 135–145)
Total Bilirubin: 0.5 mg/dL (ref 0.3–1.2)
Total Protein: 6.8 g/dL (ref 6.5–8.1)

## 2022-09-09 LAB — CBC WITH DIFFERENTIAL (CANCER CENTER ONLY)
Abs Immature Granulocytes: 0.01 10*3/uL (ref 0.00–0.07)
Basophils Absolute: 0 10*3/uL (ref 0.0–0.1)
Basophils Relative: 0 %
Eosinophils Absolute: 0 10*3/uL (ref 0.0–0.5)
Eosinophils Relative: 0 %
HCT: 33.2 % — ABNORMAL LOW (ref 36.0–46.0)
Hemoglobin: 11.3 g/dL — ABNORMAL LOW (ref 12.0–15.0)
Immature Granulocytes: 0 %
Lymphocytes Relative: 36 %
Lymphs Abs: 0.9 10*3/uL (ref 0.7–4.0)
MCH: 29.7 pg (ref 26.0–34.0)
MCHC: 34 g/dL (ref 30.0–36.0)
MCV: 87.4 fL (ref 80.0–100.0)
Monocytes Absolute: 0.4 10*3/uL (ref 0.1–1.0)
Monocytes Relative: 16 %
Neutro Abs: 1.2 10*3/uL — ABNORMAL LOW (ref 1.7–7.7)
Neutrophils Relative %: 48 %
Platelet Count: 113 10*3/uL — ABNORMAL LOW (ref 150–400)
RBC: 3.8 MIL/uL — ABNORMAL LOW (ref 3.87–5.11)
RDW: 14.7 % (ref 11.5–15.5)
WBC Count: 2.6 10*3/uL — ABNORMAL LOW (ref 4.0–10.5)
nRBC: 0 % (ref 0.0–0.2)

## 2022-09-14 ENCOUNTER — Other Ambulatory Visit: Payer: PPO

## 2022-09-14 ENCOUNTER — Other Ambulatory Visit: Payer: Self-pay | Admitting: Internal Medicine

## 2022-09-15 ENCOUNTER — Inpatient Hospital Stay: Payer: PPO | Attending: Internal Medicine

## 2022-09-15 DIAGNOSIS — Z79899 Other long term (current) drug therapy: Secondary | ICD-10-CM | POA: Insufficient documentation

## 2022-09-15 DIAGNOSIS — Z5111 Encounter for antineoplastic chemotherapy: Secondary | ICD-10-CM | POA: Diagnosis not present

## 2022-09-15 DIAGNOSIS — C3432 Malignant neoplasm of lower lobe, left bronchus or lung: Secondary | ICD-10-CM | POA: Diagnosis not present

## 2022-09-15 LAB — CMP (CANCER CENTER ONLY)
ALT: 28 U/L (ref 0–44)
AST: 21 U/L (ref 15–41)
Albumin: 3.8 g/dL (ref 3.5–5.0)
Alkaline Phosphatase: 83 U/L (ref 38–126)
Anion gap: 11 (ref 5–15)
BUN: 9 mg/dL (ref 8–23)
CO2: 25 mmol/L (ref 22–32)
Calcium: 8.6 mg/dL — ABNORMAL LOW (ref 8.9–10.3)
Chloride: 103 mmol/L (ref 98–111)
Creatinine: 0.79 mg/dL (ref 0.44–1.00)
GFR, Estimated: >60 mL/min (ref >60)
Glucose, Bld: 113 mg/dL — ABNORMAL HIGH (ref 70–99)
Potassium: 4.3 mmol/L (ref 3.5–5.1)
Sodium: 139 mmol/L (ref 135–145)
Total Bilirubin: 0.6 mg/dL (ref 0.3–1.2)
Total Protein: 7.1 g/dL (ref 6.5–8.1)

## 2022-09-15 LAB — CBC WITH DIFFERENTIAL (CANCER CENTER ONLY)
Abs Immature Granulocytes: 0.01 10*3/uL (ref 0.00–0.07)
Basophils Absolute: 0 10*3/uL (ref 0.0–0.1)
Basophils Relative: 0 %
Eosinophils Absolute: 0 10*3/uL (ref 0.0–0.5)
Eosinophils Relative: 1 %
HCT: 31 % — ABNORMAL LOW (ref 36.0–46.0)
Hemoglobin: 10.5 g/dL — ABNORMAL LOW (ref 12.0–15.0)
Immature Granulocytes: 0 %
Lymphocytes Relative: 26 %
Lymphs Abs: 0.9 10*3/uL (ref 0.7–4.0)
MCH: 29.9 pg (ref 26.0–34.0)
MCHC: 33.9 g/dL (ref 30.0–36.0)
MCV: 88.3 fL (ref 80.0–100.0)
Monocytes Absolute: 0.4 10*3/uL (ref 0.1–1.0)
Monocytes Relative: 12 %
Neutro Abs: 2.1 10*3/uL (ref 1.7–7.7)
Neutrophils Relative %: 61 %
Platelet Count: 114 10*3/uL — ABNORMAL LOW (ref 150–400)
RBC: 3.51 MIL/uL — ABNORMAL LOW (ref 3.87–5.11)
RDW: 15.4 % (ref 11.5–15.5)
WBC Count: 3.4 10*3/uL — ABNORMAL LOW (ref 4.0–10.5)
nRBC: 0 % (ref 0.0–0.2)

## 2022-09-17 MED FILL — Dexamethasone Sodium Phosphate Inj 100 MG/10ML: INTRAMUSCULAR | Qty: 1 | Status: AC

## 2022-09-17 MED FILL — Fosaprepitant Dimeglumine For IV Infusion 150 MG (Base Eq): INTRAVENOUS | Qty: 5 | Status: AC

## 2022-09-20 ENCOUNTER — Encounter: Payer: Self-pay | Admitting: Internal Medicine

## 2022-09-20 ENCOUNTER — Inpatient Hospital Stay: Payer: PPO

## 2022-09-20 ENCOUNTER — Inpatient Hospital Stay (HOSPITAL_BASED_OUTPATIENT_CLINIC_OR_DEPARTMENT_OTHER): Payer: PPO | Admitting: Internal Medicine

## 2022-09-20 DIAGNOSIS — Z5111 Encounter for antineoplastic chemotherapy: Secondary | ICD-10-CM | POA: Diagnosis not present

## 2022-09-20 DIAGNOSIS — C3432 Malignant neoplasm of lower lobe, left bronchus or lung: Secondary | ICD-10-CM

## 2022-09-20 LAB — CMP (CANCER CENTER ONLY)
ALT: 22 U/L (ref 0–44)
AST: 18 U/L (ref 15–41)
Albumin: 3.9 g/dL (ref 3.5–5.0)
Alkaline Phosphatase: 84 U/L (ref 38–126)
Anion gap: 8 (ref 5–15)
BUN: 8 mg/dL (ref 8–23)
CO2: 26 mmol/L (ref 22–32)
Calcium: 8.9 mg/dL (ref 8.9–10.3)
Chloride: 105 mmol/L (ref 98–111)
Creatinine: 0.84 mg/dL (ref 0.44–1.00)
GFR, Estimated: >60 mL/min (ref >60)
Glucose, Bld: 187 mg/dL — ABNORMAL HIGH (ref 70–99)
Potassium: 3.7 mmol/L (ref 3.5–5.1)
Sodium: 139 mmol/L (ref 135–145)
Total Bilirubin: 0.4 mg/dL (ref 0.3–1.2)
Total Protein: 6.2 g/dL — ABNORMAL LOW (ref 6.5–8.1)

## 2022-09-20 LAB — CBC WITH DIFFERENTIAL (CANCER CENTER ONLY)
Abs Immature Granulocytes: 0.01 10*3/uL (ref 0.00–0.07)
Basophils Absolute: 0 10*3/uL (ref 0.0–0.1)
Basophils Relative: 0 %
Eosinophils Absolute: 0 10*3/uL (ref 0.0–0.5)
Eosinophils Relative: 0 %
HCT: 31.4 % — ABNORMAL LOW (ref 36.0–46.0)
Hemoglobin: 10.9 g/dL — ABNORMAL LOW (ref 12.0–15.0)
Immature Granulocytes: 0 %
Lymphocytes Relative: 30 %
Lymphs Abs: 0.8 10*3/uL (ref 0.7–4.0)
MCH: 30.6 pg (ref 26.0–34.0)
MCHC: 34.7 g/dL (ref 30.0–36.0)
MCV: 88.2 fL (ref 80.0–100.0)
Monocytes Absolute: 0.4 10*3/uL (ref 0.1–1.0)
Monocytes Relative: 15 %
Neutro Abs: 1.4 10*3/uL — ABNORMAL LOW (ref 1.7–7.7)
Neutrophils Relative %: 55 %
Platelet Count: 209 10*3/uL (ref 150–400)
RBC: 3.56 MIL/uL — ABNORMAL LOW (ref 3.87–5.11)
RDW: 16.5 % — ABNORMAL HIGH (ref 11.5–15.5)
WBC Count: 2.6 10*3/uL — ABNORMAL LOW (ref 4.0–10.5)
nRBC: 0 % (ref 0.0–0.2)

## 2022-09-20 MED ORDER — SODIUM CHLORIDE 0.9 % IV SOLN
150.0000 mg | Freq: Once | INTRAVENOUS | Status: AC
  Administered 2022-09-20: 150 mg via INTRAVENOUS
  Filled 2022-09-20: qty 150

## 2022-09-20 MED ORDER — CYANOCOBALAMIN 1000 MCG/ML IJ SOLN
1000.0000 ug | Freq: Once | INTRAMUSCULAR | Status: AC
  Administered 2022-09-20: 1000 ug via INTRAMUSCULAR
  Filled 2022-09-20: qty 1

## 2022-09-20 MED ORDER — SODIUM CHLORIDE 0.9 % IV SOLN
10.0000 mg | Freq: Once | INTRAVENOUS | Status: AC
  Administered 2022-09-20: 10 mg via INTRAVENOUS
  Filled 2022-09-20: qty 10

## 2022-09-20 MED ORDER — SODIUM CHLORIDE 0.9 % IV SOLN
440.0000 mg | Freq: Once | INTRAVENOUS | Status: AC
  Administered 2022-09-20: 450 mg via INTRAVENOUS
  Filled 2022-09-20: qty 45

## 2022-09-20 MED ORDER — SODIUM CHLORIDE 0.9 % IV SOLN: Freq: Once | INTRAVENOUS | Status: AC

## 2022-09-20 MED ORDER — SODIUM CHLORIDE 0.9 % IV SOLN
500.0000 mg/m2 | Freq: Once | INTRAVENOUS | Status: AC
  Administered 2022-09-20: 1000 mg via INTRAVENOUS
  Filled 2022-09-20: qty 40

## 2022-09-20 MED ORDER — PALONOSETRON HCL INJECTION 0.25 MG/5ML
0.2500 mg | Freq: Once | INTRAVENOUS | Status: AC
  Administered 2022-09-20: 0.25 mg via INTRAVENOUS
  Filled 2022-09-20: qty 5

## 2022-09-20 NOTE — Patient Instructions (Signed)
[facility]  Discharge Instructions: Thank you for choosing [facility] to provide your oncology and hematology care.   If you have a lab appointment with the [facility], please go directly to the [facility] and check in at the registration area.   Wear comfortable clothing and clothing appropriate for easy access to any Portacath or PICC line.   We strive to give you quality time with your provider. You may need to reschedule your appointment if you arrive late (15 or more minutes).  Arriving late affects you and other patients whose appointments are after yours.  Also, if you miss three or more appointments without notifying the office, you may be dismissed from the clinic at the provider's discretion.      For prescription refill requests, have your pharmacy contact our office and allow 72 hours for refills to be completed.    Today you received the following chemotherapy and/or immunotherapy agents: pemetrexed and carboplatin      To help prevent nausea and vomiting after your treatment, we encourage you to take your nausea medication as directed.  BELOW ARE SYMPTOMS THAT SHOULD BE REPORTED IMMEDIATELY: *FEVER GREATER THAN 100.4 F (38 C) OR HIGHER *CHILLS OR SWEATING *NAUSEA AND VOMITING THAT IS NOT CONTROLLED WITH YOUR NAUSEA MEDICATION *UNUSUAL SHORTNESS OF BREATH *UNUSUAL BRUISING OR BLEEDING *URINARY PROBLEMS (pain or burning when urinating, or frequent urination) *BOWEL PROBLEMS (unusual diarrhea, constipation, pain near the anus) TENDERNESS IN MOUTH AND THROAT WITH OR WITHOUT PRESENCE OF ULCERS (sore throat, sores in mouth, or a toothache) UNUSUAL RASH, SWELLING OR PAIN  UNUSUAL VAGINAL DISCHARGE OR ITCHING   Items with * indicate a potential emergency and should be followed up as soon as possible or go to the Emergency Department if any problems should occur.  Please show the CHEMOTHERAPY ALERT CARD or IMMUNOTHERAPY ALERT  CARD at check-in to the Emergency Department and triage nurse.  Should you have questions after your visit or need to cancel or reschedule your appointment, please contact [facility]  Dept: [phone]  and follow the prompts.  Office hours are 8:00 a.m. to 4:30 p.m. Monday - Friday. Please note that voicemails left after 4:00 p.m. may not be returned until the following business day.  We are closed weekends and major holidays. You have access to a nurse at all times for urgent questions. Please call the main number to the clinic Dept: [phone] and follow the prompts.   For any non-urgent questions, you may also contact your provider using MyChart. We now offer e-Visits for anyone 23 and older to request care online for non-urgent symptoms. For details visit [email or web address].   Also download the MyChart app! Go to the app store, search "MyChart", open the app, select [facility], and log in with your MyChart username and password.

## 2022-09-20 NOTE — Progress Notes (Signed)
Per Dr. Julien Nordmann, it is ok to treat pt today with carboplatin and Pemetrexed and ANC of 1.4.

## 2022-09-20 NOTE — Progress Notes (Signed)
[facility] [phone]   [phone]  OFFICE PROGRESS NOTE  Pleas Koch, NP [place] [address]  DIAGNOSIS: Stage IIb (T3, N0, M0)  non-small cell lung cancer, adenocarcinoma presented with large left lower lobe lung mass diagnosed in August 2023  Molecular studies by [facility] 1 showed no actionable mutation and she has negative PD-L1 expression.  PRIOR THERAPY: Status post left lower lobectomy with lymph node sampling under the care of Dr. Kipp Brood on June 16, 2022 with tumor size of 5.5 cm and visceropleural involvement.  CURRENT THERAPY: Adjuvant systemic chemotherapy with carboplatin for AUC of 5 and Alimta 500 Mg/M2.  First dose August 09, 2022.  Status post 2 cycles.  INTERVAL HISTORY: Megan [name] 72 y.o. female returns to the clinic today for follow-up visit accompanied by her husband.  The patient is feeling fine today with no concerning complaints except for mild fatigue.  She denied having any current chest pain, shortness breath, cough or hemoptysis.  She has no nausea, vomiting, diarrhea or constipation.  She has no headache or visual changes.  She denied having any weight loss or night sweats.  She continues to tolerate her treatment with carboplatin and Alimta fairly well.  She is here for evaluation before starting cycle #3.  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   Constipation due to pain medication    Debility    Depression    Dyspnea    W/ PHYS CONDITION    Emphysema of lung ([facility])    Foul smelling urine 07/22/2020   GERD (gastroesophageal reflux disease)    History of bronchitis    "not since I quit smoking" (07/26/2017)   History of kidney stones    History of shingles    Hyperlipidemia     takes Fish Oil daily   Hypertension    Hypoalbuminemia due to protein-calorie malnutrition ([facility])    L3 vertebral fracture ([facility]) 09/01/2017   Lung cancer ([facility]) 2023   Lung nodule 04/21/2022   Lung nodule seen on imaging study 08/14/2021   Added automatically from request for surgery [id number]   Myelopathy ([facility]) 12/21/2016   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   Neuromuscular disorder ([facility])    tingling toes   Paraparesis ([facility]) 12/15/2016   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  MEDICATIONS:  Current Outpatient Medications  Medication Sig Dispense Refill   acetaminophen (TYLENOL) 500 MG tablet Take 500 mg by mouth every 8 (eight) hours as needed (for pain).     amLODipine (NORVASC) 10 MG tablet Take 1 tablet (10 mg total) by mouth daily. For blood pressure 90 tablet 3   atorvastatin (LIPITOR) 40 MG tablet TAKE 1 TABLET(40 MG) BY MOUTH DAILY FOR CHOLESTEROL 90 tablet 2  baclofen (LIORESAL) 10 MG tablet Take 1 tablet (10 mg total) by mouth at bedtime. For muscle spasms. 90 each 2   Biotin 5000 MCG CAPS Take 5,000 mcg by mouth every morning.      cetirizine (ZYRTEC) 10 MG tablet Take 1 tablet (10 mg total) by mouth daily. For allergies 90 tablet 0   Cholecalciferol (VITAMIN D) 50 MCG (2000 UT) tablet Take 2,000 Units by mouth daily.     Coenzyme Q10 (COQ10) 100 MG CAPS Take 100 mg by mouth every evening.     dexamethasone (DECADRON) 4 MG tablet 4 mg p.o. twice daily the day before, day of and day after chemotherapy every 3  weeks 30 tablet 0   esomeprazole (NEXIUM 24HR) 20 MG capsule Take 1 capsule (20 mg total) by mouth daily at 12 noon. 90 capsule 1   fluocinonide cream (LIDEX) 9.32 % Apply 1 Application topically 2 (two) times daily as needed (irritation).     folic acid (FOLVITE) 1 MG tablet Take 1 tablet (1 mg total) by mouth daily. 30 tablet 4   gabapentin (NEURONTIN) 300 MG capsule TAKE 1 CAPSULE(300 MG) BY MOUTH TWICE DAILY FOR PAIN 180 capsule 2   glipiZIDE (GLUCOTROL) 5 MG tablet Take 1 tablet (5 mg total) by mouth 2 (two) times daily before a meal. for diabetes. 180 tablet 0   lisinopril (ZESTRIL) 20 MG tablet TAKE 1 TABLET(20 MG) BY MOUTH DAILY FOR BLOOD PRESSURE 90 tablet 3   metFORMIN (GLUCOPHAGE) 1000 MG tablet Take 0.5 tablets (500 mg total) by mouth 2 (two) times daily with a meal. for diabetes. 90 tablet 0   nystatin-triamcinolone ointment (MYCOLOG) Apply 1 Application topically 2 (two) times daily as needed (irritation).     oxybutynin (DITROPAN-XL) 5 MG 24 hr tablet TAKE 1 TABLET(5 MG) BY MOUTH AT BEDTIME FOR OVERACTIVE BLADDER 90 tablet 2   oxybutynin (OXYTROL) 3.9 MG/24HR Place 1 patch onto the skin See admin instructions. Change every 5 days     Polyethyl Glycol-Propyl Glycol (LUBRICANT EYE DROPS) 0.4-0.3 % SOLN Place 1-2 drops into both eyes 2 (two) times daily.     prochlorperazine (COMPAZINE) 10 MG tablet TAKE 1 TABLET(10 MG) BY MOUTH EVERY 6 HOURS AS NEEDED FOR NAUSEA OR VOMITING 30 tablet 0   senna-docusate (SENOKOT-S) 8.6-50 MG tablet Take 2 tablets by mouth 2 (two) times daily. (Patient taking differently: Take 1 tablet by mouth every other day.)     No current facility-administered medications for this visit.    SURGICAL HISTORY:  Past Surgical History:  Procedure Laterality Date   ABDOMINAL EXPOSURE N/A 07/26/2017   Procedure: ABDOMINAL EXPOSURE;  Surgeon: Angelia Mould, MD;  Location: [facility];  Service: Vascular;  Laterality: N/A;   ANTERIOR CERVICAL DECOMP/DISCECTOMY  FUSION  2001   ANTERIOR LUMBAR FUSION N/A 07/26/2017   Procedure: Lumbar five-Sacral one Anterior lumbar interbody fusion with Dr. Deitra Mayo for approach;  Surgeon: Ditty, Kevan Ny, MD;  Location: [facility];  Service: Neurosurgery;  Laterality: N/A;   APPLICATION OF ROBOTIC ASSISTANCE FOR SPINAL PROCEDURE  12/19/2016   Procedure: APPLICATION OF ROBOTIC ASSISTANCE FOR SPINAL PROCEDURE;  Surgeon: Kevan Ny Ditty, MD;  Location: [facility];  Service: Neurosurgery;;   APPLICATION OF ROBOTIC ASSISTANCE FOR SPINAL PROCEDURE N/A 07/26/2017   Procedure: APPLICATION OF ROBOTIC ASSISTANCE FOR SPINAL PROCEDURE;  Surgeon: Ditty, Kevan Ny, MD;  Location: [facility];  Service: Neurosurgery;  Laterality: N/A;   APPLICATION OF ROBOTIC ASSISTANCE FOR SPINAL PROCEDURE N/A 09/02/2017   Procedure: APPLICATION  OF ROBOTIC ASSISTANCE FOR SPINAL PROCEDURE;  Surgeon: Ditty, Kevan Ny, MD;  Location: [facility];  Service: Neurosurgery;  Laterality: N/A;   BACK SURGERY     BLADDER SUSPENSION  1991   tack   BRONCHIAL BIOPSY  09/15/2021   Procedure: BRONCHIAL BIOPSIES;  Surgeon: Garner Nash, DO;  Location: [facility];  Service: Pulmonary;;   BRONCHIAL BIOPSY  05/04/2022   Procedure: BRONCHIAL BIOPSIES;  Surgeon: Garner Nash, DO;  Location: [facility];  Service: Pulmonary;;   BRONCHIAL BRUSHINGS  09/15/2021   Procedure: BRONCHIAL BRUSHINGS;  Surgeon: Garner Nash, DO;  Location: [facility];  Service: Pulmonary;;   BRONCHIAL NEEDLE ASPIRATION BIOPSY  09/15/2021   Procedure: BRONCHIAL NEEDLE ASPIRATION BIOPSIES;  Surgeon: Garner Nash, DO;  Location: [facility];  Service: Pulmonary;;   BRONCHIAL NEEDLE ASPIRATION BIOPSY  05/04/2022   Procedure: BRONCHIAL NEEDLE ASPIRATION BIOPSIES;  Surgeon: Garner Nash, DO;  Location: [facility];  Service: Pulmonary;;   BRONCHIAL WASHINGS  09/15/2021   Procedure: BRONCHIAL WASHINGS;  Surgeon: Garner Nash, DO;  Location: [facility];  Service:  Pulmonary;;   FIDUCIAL MARKER PLACEMENT  09/15/2021   Procedure: FIDUCIAL MARKER PLACEMENT;  Surgeon: Garner Nash, DO;  Location: [facility];  Service: Pulmonary;;   INGUINAL HERNIA REPAIR Right 1991   INTERCOSTAL NERVE BLOCK Left 06/16/2022   Procedure: INTERCOSTAL NERVE BLOCK;  Surgeon: [name], MD;  Location: [facility];  Service: Thoracic;  Laterality: Left;   LUMBAR FUSION  2016; 2017; 07/26/2017   L4-5; L2-3; L5-S1   LYMPH NODE DISSECTION Left 06/16/2022   Procedure: LYMPH NODE DISSECTION;  Surgeon: [name], MD;  Location: [facility];  Service: Thoracic;  Laterality: Left;   POSTERIOR LUMBAR FUSION 4 WITH HARDWARE REMOVAL N/A 09/02/2017   Procedure: Lumbar three-four redo laminectomy; Repositioning of Left Sacral two screw; Extension of lumbar fusion to Thoracic twelve;  Surgeon: Ditty, Kevan Ny, MD;  Location: [facility];  Service: Neurosurgery;  Laterality: N/A;   TONSILLECTOMY AND ADENOIDECTOMY  1959   TUMOR EXCISION     WERTHIN'S TUMORS BOTH SIDES OF NECK   VAGINAL HYSTERECTOMY  1979   VIDEO BRONCHOSCOPY WITH RADIAL ENDOBRONCHIAL ULTRASOUND  09/15/2021   Procedure: VIDEO BRONCHOSCOPY WITH RADIAL ENDOBRONCHIAL ULTRASOUND;  Surgeon: Garner Nash, DO;  Location: [facility];  Service: Pulmonary;;   VIDEO BRONCHOSCOPY WITH RADIAL ENDOBRONCHIAL ULTRASOUND  05/04/2022   Procedure: VIDEO BRONCHOSCOPY WITH RADIAL ENDOBRONCHIAL ULTRASOUND;  Surgeon: Garner Nash, DO;  Location: [facility];  Service: Pulmonary;;    REVIEW OF SYSTEMS:  A comprehensive review of systems was negative except for: Constitutional: positive for fatigue   PHYSICAL EXAMINATION: General appearance: alert, cooperative, fatigued, and no distress Head: Normocephalic, without obvious abnormality, atraumatic Neck: no adenopathy, no JVD, supple, symmetrical, trachea midline, and thyroid not enlarged, symmetric, no tenderness/mass/nodules Lymph nodes: Cervical, supraclavicular, and axillary  nodes normal. Resp: clear to auscultation bilaterally Back: symmetric, no curvature. ROM normal. No CVA tenderness. Cardio: regular rate and rhythm, S1, S2 normal, no murmur, click, rub or gallop GI: soft, non-tender; bowel sounds normal; no masses,  no organomegaly Extremities: extremities normal, atraumatic, no cyanosis or edema  ECOG PERFORMANCE STATUS: 1 - Symptomatic but completely ambulatory  Blood pressure (!) 160/80, pulse 91, temperature 98.7 F (37.1 C), temperature source Oral, resp. rate 17, weight 163 lb 1.6 oz (74 kg), SpO2 99 %.  LABORATORY DATA: Lab Results  Component Value Date   WBC 2.6 (L) 09/20/2022   HGB 10.9 (L)  09/20/2022   HCT 31.4 (L) 09/20/2022   MCV 88.2 09/20/2022   PLT 209 09/20/2022      Chemistry      Component Value Date/Time   NA 139 09/15/2022 1527   K 4.3 09/15/2022 1527   CL 103 09/15/2022 1527   CO2 25 09/15/2022 1527   BUN 9 09/15/2022 1527   CREATININE 0.79 09/15/2022 1527   CREATININE 0.84 07/31/2021 1600      Component Value Date/Time   CALCIUM 8.6 (L) 09/15/2022 1527   ALKPHOS 83 09/15/2022 1527   AST 21 09/15/2022 1527   ALT 28 09/15/2022 1527   BILITOT 0.6 09/15/2022 1527       RADIOGRAPHIC STUDIES: No results found.  ASSESSMENT AND PLAN: This is a very pleasant 72 years old white female with Stage IIb (T3, N0, M0)  non-small cell lung cancer, adenocarcinoma presented with large left lower lobe lung mass diagnosed in August 2023 status post left lower lobectomy with lymph node sampling under the care of Dr. Kipp Brood on June 16, 2022 with tumor size of 5.5 cm and visceropleural involvement. Molecular studies by [facility] 1 showed no actionable mutation and she has negative PD-L1 expression. The patient started adjuvant systemic chemotherapy with carboplatin for AUC of 5 and Alimta 500 Mg/M2 status post 2 cycles started on August 09, 2022.  The patient has been tolerating this treatment well with no concerning adverse  effects except for mild fatigue. I recommended for her to proceed with cycle #3 today as planned.  Will continue to monitor her white blood count closely on this treatment. The patient will come back for follow-up visit in 3 weeks for evaluation before the next cycle of her treatment. She was advised to call immediately if she has any other concerning symptoms in the interval. The patient voices understanding of current disease status and treatment options and is in agreement with the current care plan.  All questions were answered. The patient knows to call the clinic with any problems, questions or concerns. We can certainly see the patient much sooner if necessary.  The total time spent in the appointment was 20 minutes.  Disclaimer: This note was dictated with voice recognition software. Similar sounding words can inadvertently be transcribed and may not be corrected upon review.

## 2022-09-24 ENCOUNTER — Telehealth: Payer: Self-pay | Admitting: Internal Medicine

## 2022-09-24 NOTE — Telephone Encounter (Signed)
Called patient regarding upcoming January appointments, patient is notified.

## 2022-09-27 ENCOUNTER — Inpatient Hospital Stay: Payer: PPO

## 2022-09-27 ENCOUNTER — Other Ambulatory Visit: Payer: Self-pay

## 2022-09-27 DIAGNOSIS — Z5111 Encounter for antineoplastic chemotherapy: Secondary | ICD-10-CM

## 2022-09-27 LAB — CBC WITH DIFFERENTIAL (CANCER CENTER ONLY)
Abs Immature Granulocytes: 0.02 10*3/uL (ref 0.00–0.07)
Basophils Absolute: 0 10*3/uL (ref 0.0–0.1)
Basophils Relative: 0 %
Eosinophils Absolute: 0 10*3/uL (ref 0.0–0.5)
Eosinophils Relative: 0 %
HCT: 31.3 % — ABNORMAL LOW (ref 36.0–46.0)
Hemoglobin: 10.8 g/dL — ABNORMAL LOW (ref 12.0–15.0)
Immature Granulocytes: 1 %
Lymphocytes Relative: 29 %
Lymphs Abs: 0.8 10*3/uL (ref 0.7–4.0)
MCH: 30.2 pg (ref 26.0–34.0)
MCHC: 34.5 g/dL (ref 30.0–36.0)
MCV: 87.4 fL (ref 80.0–100.0)
Monocytes Absolute: 0.3 10*3/uL (ref 0.1–1.0)
Monocytes Relative: 9 %
Neutro Abs: 1.8 10*3/uL (ref 1.7–7.7)
Neutrophils Relative %: 61 %
Platelet Count: 174 10*3/uL (ref 150–400)
RBC: 3.58 MIL/uL — ABNORMAL LOW (ref 3.87–5.11)
RDW: 16.1 % — ABNORMAL HIGH (ref 11.5–15.5)
WBC Count: 2.9 10*3/uL — ABNORMAL LOW (ref 4.0–10.5)
nRBC: 0 % (ref 0.0–0.2)

## 2022-09-27 LAB — CMP (CANCER CENTER ONLY)
ALT: 35 U/L (ref 0–44)
AST: 17 U/L (ref 15–41)
Albumin: 3.7 g/dL (ref 3.5–5.0)
Alkaline Phosphatase: 77 U/L (ref 38–126)
Anion gap: 8 (ref 5–15)
BUN: 19 mg/dL (ref 8–23)
CO2: 25 mmol/L (ref 22–32)
Calcium: 8.5 mg/dL — ABNORMAL LOW (ref 8.9–10.3)
Chloride: 104 mmol/L (ref 98–111)
Creatinine: 0.77 mg/dL (ref 0.44–1.00)
GFR, Estimated: >60 mL/min (ref >60)
Glucose, Bld: 134 mg/dL — ABNORMAL HIGH (ref 70–99)
Potassium: 4.3 mmol/L (ref 3.5–5.1)
Sodium: 137 mmol/L (ref 135–145)
Total Bilirubin: 0.5 mg/dL (ref 0.3–1.2)
Total Protein: 6.4 g/dL — ABNORMAL LOW (ref 6.5–8.1)

## 2022-10-04 ENCOUNTER — Inpatient Hospital Stay: Payer: PPO

## 2022-10-04 DIAGNOSIS — Z5111 Encounter for antineoplastic chemotherapy: Secondary | ICD-10-CM | POA: Diagnosis not present

## 2022-10-04 LAB — CMP (CANCER CENTER ONLY)
ALT: 18 U/L (ref 0–44)
AST: 15 U/L (ref 15–41)
Albumin: 3.7 g/dL (ref 3.5–5.0)
Alkaline Phosphatase: 76 U/L (ref 38–126)
Anion gap: 10 (ref 5–15)
BUN: 10 mg/dL (ref 8–23)
CO2: 26 mmol/L (ref 22–32)
Calcium: 8.8 mg/dL — ABNORMAL LOW (ref 8.9–10.3)
Chloride: 104 mmol/L (ref 98–111)
Creatinine: 0.79 mg/dL (ref 0.44–1.00)
GFR, Estimated: >60 mL/min (ref >60)
Glucose, Bld: 140 mg/dL — ABNORMAL HIGH (ref 70–99)
Potassium: 4.4 mmol/L (ref 3.5–5.1)
Sodium: 140 mmol/L (ref 135–145)
Total Bilirubin: 0.4 mg/dL (ref 0.3–1.2)
Total Protein: 6.3 g/dL — ABNORMAL LOW (ref 6.5–8.1)

## 2022-10-04 LAB — CBC WITH DIFFERENTIAL (CANCER CENTER ONLY)
Abs Immature Granulocytes: 0.01 10*3/uL (ref 0.00–0.07)
Basophils Absolute: 0 10*3/uL (ref 0.0–0.1)
Basophils Relative: 0 %
Eosinophils Absolute: 0.1 10*3/uL (ref 0.0–0.5)
Eosinophils Relative: 3 %
HCT: 28.5 % — ABNORMAL LOW (ref 36.0–46.0)
Hemoglobin: 9.8 g/dL — ABNORMAL LOW (ref 12.0–15.0)
Immature Granulocytes: 0 %
Lymphocytes Relative: 30 %
Lymphs Abs: 0.9 10*3/uL (ref 0.7–4.0)
MCH: 30.6 pg (ref 26.0–34.0)
MCHC: 34.4 g/dL (ref 30.0–36.0)
MCV: 89.1 fL (ref 80.0–100.0)
Monocytes Absolute: 0.4 10*3/uL (ref 0.1–1.0)
Monocytes Relative: 11 %
Neutro Abs: 1.8 10*3/uL (ref 1.7–7.7)
Neutrophils Relative %: 56 %
Platelet Count: 57 10*3/uL — ABNORMAL LOW (ref 150–400)
RBC: 3.2 MIL/uL — ABNORMAL LOW (ref 3.87–5.11)
RDW: 16.5 % — ABNORMAL HIGH (ref 11.5–15.5)
WBC Count: 3.2 10*3/uL — ABNORMAL LOW (ref 4.0–10.5)
nRBC: 0 % (ref 0.0–0.2)

## 2022-10-08 MED FILL — Dexamethasone Sodium Phosphate Inj 100 MG/10ML: INTRAMUSCULAR | Qty: 1 | Status: AC

## 2022-10-08 MED FILL — Fosaprepitant Dimeglumine For IV Infusion 150 MG (Base Eq): INTRAVENOUS | Qty: 5 | Status: AC

## 2022-10-09 NOTE — Progress Notes (Unsigned)
[facility] OFFICE PROGRESS NOTE  Pleas Koch, NP [place] [address]  DIAGNOSIS: Stage IIb (T3, N0, M0)  non-small cell lung cancer, adenocarcinoma presented with large left lower lobe lung mass diagnosed in August 2023  Molecular studies by foundation 1 showed no actionable mutation and she has negative PD-L1 expression.  PRIOR THERAPY: Status post left lower lobectomy with lymph node sampling under the care of Dr. Kipp Brood on June 16, 2022 with tumor size of 5.5 cm and visceropleural involvement.    CURRENT THERAPY: Adjuvant systemic chemotherapy with carboplatin for AUC of 5 and Alimta 500 Mg/M2. First dose August 09, 2022. Status post 3 cycles.    INTERVAL HISTORY: Megan [name] 72 y.o. female returns to the clinic today for a follow-up visit.  The patient is currently going adjuvant chemotherapy.  She is here for her fourth and last cycle of treatment today.  She is feeling good today. She is tolerating treatment well except for some fatigue. She has some taste alterations without thrush. She denies any fever or night sweats.   She reports mild dyspnea on exertion. Denies any cough.  She denies chest pain today. She has some controlled nausea with treatment. Denies vomiting. She will use laxatives if needed for constipation.  Denies any headache or visual changes except for baseline visual changes from her cataracts. She has an upcoming appointment with her eye doctor on 11/01/22. She sometimes has steroid induced hyperglycemia due to her decadron premeds. Her PCP added glipizide to her medications. She is here today for evaluation and repeat blood work before undergoing her last cycle of adjuvant treatment.   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   Constipation due to pain medication    Debility    Depression    Dyspnea    W/ PHYS CONDITION    Emphysema of lung ([facility])    Foul smelling urine 07/22/2020   GERD (gastroesophageal reflux disease)    History of bronchitis    "not since I quit smoking" (07/26/2017)   History of kidney stones    History of shingles    Hyperlipidemia    takes Fish Oil daily   Hypertension    Hypoalbuminemia due to protein-calorie malnutrition ([facility])    L3 vertebral fracture ([facility]) 09/01/2017   Lung cancer ([facility]) 2023   Lung nodule 04/21/2022   Lung nodule seen on imaging study 08/14/2021   Added automatically from request for surgery [id number]   Myelopathy ([facility]) 12/21/2016   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  MEDICATIONS:  Current Outpatient Medications  Medication Sig Dispense Refill   acetaminophen (TYLENOL) 500 MG tablet Take  500 mg by mouth every 8 (eight) hours as needed (for pain).     amLODipine (NORVASC) 10 MG tablet Take 1 tablet (10 mg total) by mouth daily. For blood pressure 90 tablet 3   atorvastatin (LIPITOR) 40 MG tablet TAKE 1 TABLET(40 MG) BY MOUTH DAILY FOR CHOLESTEROL 90 tablet 2   baclofen (LIORESAL) 10 MG tablet Take 1 tablet (10 mg total) by mouth at bedtime. For muscle spasms. 90 each 2   Biotin 5000 MCG CAPS Take 5,000 mcg by mouth every morning.      cetirizine (ZYRTEC) 10 MG  tablet Take 1 tablet (10 mg total) by mouth daily. For allergies 90 tablet 0   Cholecalciferol (VITAMIN D) 50 MCG (2000 UT) tablet Take 2,000 Units by mouth daily.     Coenzyme Q10 (COQ10) 100 MG CAPS Take 100 mg by mouth every evening.     dexamethasone (DECADRON) 4 MG tablet 4 mg p.o. twice daily the day before, day of and day after chemotherapy every 3 weeks 30 tablet 0   esomeprazole (NEXIUM 24HR) 20 MG capsule Take 1 capsule (20 mg total) by mouth daily at 12 noon. 90 capsule 1   fluocinonide cream (LIDEX) 1.88 % Apply 1 Application topically 2 (two) times daily as needed (irritation).     folic acid (FOLVITE) 1 MG tablet Take 1 tablet (1 mg total) by mouth daily. 30 tablet 4   gabapentin (NEURONTIN) 300 MG capsule TAKE 1 CAPSULE(300 MG) BY MOUTH TWICE DAILY FOR PAIN 180 capsule 2   glipiZIDE (GLUCOTROL) 5 MG tablet Take 1 tablet (5 mg total) by mouth 2 (two) times daily before a meal. for diabetes. 180 tablet 0   lisinopril (ZESTRIL) 20 MG tablet TAKE 1 TABLET(20 MG) BY MOUTH DAILY FOR BLOOD PRESSURE 90 tablet 3   metFORMIN (GLUCOPHAGE) 1000 MG tablet Take 0.5 tablets (500 mg total) by mouth 2 (two) times daily with a meal. for diabetes. 90 tablet 0   nystatin-triamcinolone ointment (MYCOLOG) Apply 1 Application topically 2 (two) times daily as needed (irritation).     oxybutynin (DITROPAN-XL) 5 MG 24 hr tablet TAKE 1 TABLET(5 MG) BY MOUTH AT BEDTIME FOR OVERACTIVE BLADDER 90 tablet 2   oxybutynin (OXYTROL) 3.9 MG/24HR Place 1 patch onto the skin See admin instructions. Change every 5 days     Polyethyl Glycol-Propyl Glycol (LUBRICANT EYE DROPS) 0.4-0.3 % SOLN Place 1-2 drops into both eyes 2 (two) times daily.     prochlorperazine (COMPAZINE) 10 MG tablet TAKE 1 TABLET(10 MG) BY MOUTH EVERY 6 HOURS AS NEEDED FOR NAUSEA OR VOMITING 30 tablet 0   senna-docusate (SENOKOT-S) 8.6-50 MG tablet Take 2 tablets by mouth 2 (two) times daily. (Patient taking differently: Take 1 tablet by mouth every  other day.)     No current facility-administered medications for this visit.    SURGICAL HISTORY:  Past Surgical History:  Procedure Laterality Date   ABDOMINAL EXPOSURE N/A 07/26/2017   Procedure: ABDOMINAL EXPOSURE;  Surgeon: Angelia Mould, MD;  Location: [facility];  Service: Vascular;  Laterality: N/A;   ANTERIOR CERVICAL DECOMP/DISCECTOMY FUSION  2001   ANTERIOR LUMBAR FUSION N/A 07/26/2017   Procedure: Lumbar five-Sacral one Anterior lumbar interbody fusion with Dr. Deitra Mayo for approach;  Surgeon: Ditty, Kevan Ny, MD;  Location: [facility];  Service: Neurosurgery;  Laterality: N/A;   APPLICATION OF ROBOTIC ASSISTANCE FOR SPINAL PROCEDURE  12/19/2016   Procedure: APPLICATION OF ROBOTIC ASSISTANCE FOR SPINAL PROCEDURE;  Surgeon: Kevan Ny Ditty, MD;  Location:  [facility];  Service: Neurosurgery;;   APPLICATION OF ROBOTIC ASSISTANCE FOR SPINAL PROCEDURE N/A 07/26/2017   Procedure: APPLICATION OF ROBOTIC ASSISTANCE FOR SPINAL PROCEDURE;  Surgeon: Ditty, Kevan Ny, MD;  Location: [facility];  Service: Neurosurgery;  Laterality: N/A;   APPLICATION OF ROBOTIC ASSISTANCE FOR SPINAL PROCEDURE N/A 09/02/2017   Procedure: APPLICATION OF ROBOTIC ASSISTANCE FOR SPINAL PROCEDURE;  Surgeon: Ditty, Kevan Ny, MD;  Location: [facility];  Service: Neurosurgery;  Laterality: N/A;   BACK SURGERY     BLADDER SUSPENSION  1991   tack   BRONCHIAL BIOPSY  09/15/2021   Procedure: BRONCHIAL BIOPSIES;  Surgeon: Garner Nash, DO;  Location: [facility];  Service: Pulmonary;;   BRONCHIAL BIOPSY  05/04/2022   Procedure: BRONCHIAL BIOPSIES;  Surgeon: Garner Nash, DO;  Location: [facility];  Service: Pulmonary;;   BRONCHIAL BRUSHINGS  09/15/2021   Procedure: BRONCHIAL BRUSHINGS;  Surgeon: Garner Nash, DO;  Location: [facility];  Service: Pulmonary;;   BRONCHIAL NEEDLE ASPIRATION BIOPSY  09/15/2021   Procedure: BRONCHIAL NEEDLE ASPIRATION BIOPSIES;  Surgeon: Garner Nash, DO;   Location: [facility];  Service: Pulmonary;;   BRONCHIAL NEEDLE ASPIRATION BIOPSY  05/04/2022   Procedure: BRONCHIAL NEEDLE ASPIRATION BIOPSIES;  Surgeon: Garner Nash, DO;  Location: [facility];  Service: Pulmonary;;   BRONCHIAL WASHINGS  09/15/2021   Procedure: BRONCHIAL WASHINGS;  Surgeon: Garner Nash, DO;  Location: [facility];  Service: Pulmonary;;   FIDUCIAL MARKER PLACEMENT  09/15/2021   Procedure: FIDUCIAL MARKER PLACEMENT;  Surgeon: Garner Nash, DO;  Location: [facility];  Service: Pulmonary;;   INGUINAL HERNIA REPAIR Right 1991   INTERCOSTAL NERVE BLOCK Left 06/16/2022   Procedure: INTERCOSTAL NERVE BLOCK;  Surgeon: [name], MD;  Location: [facility];  Service: Thoracic;  Laterality: Left;   LUMBAR FUSION  2016; 2017; 07/26/2017   L4-5; L2-3; L5-S1   LYMPH NODE DISSECTION Left 06/16/2022   Procedure: LYMPH NODE DISSECTION;  Surgeon: [name], MD;  Location: [facility];  Service: Thoracic;  Laterality: Left;   POSTERIOR LUMBAR FUSION 4 WITH HARDWARE REMOVAL N/A 09/02/2017   Procedure: Lumbar three-four redo laminectomy; Repositioning of Left Sacral two screw; Extension of lumbar fusion to Thoracic twelve;  Surgeon: Ditty, Kevan Ny, MD;  Location: [facility];  Service: Neurosurgery;  Laterality: N/A;   TONSILLECTOMY AND ADENOIDECTOMY  1959   TUMOR EXCISION     WERTHIN'S TUMORS BOTH SIDES OF NECK   VAGINAL HYSTERECTOMY  1979   VIDEO BRONCHOSCOPY WITH RADIAL ENDOBRONCHIAL ULTRASOUND  09/15/2021   Procedure: VIDEO BRONCHOSCOPY WITH RADIAL ENDOBRONCHIAL ULTRASOUND;  Surgeon: Garner Nash, DO;  Location: [facility];  Service: Pulmonary;;   VIDEO BRONCHOSCOPY WITH RADIAL ENDOBRONCHIAL ULTRASOUND  05/04/2022   Procedure: VIDEO BRONCHOSCOPY WITH RADIAL ENDOBRONCHIAL ULTRASOUND;  Surgeon: Garner Nash, DO;  Location: [facility];  Service: Pulmonary;;    REVIEW OF SYSTEMS:   Constitutional: Positive for fatigue. Negative for appetite change, chills,  fever and unexpected weight change.  HENT: Negative for mouth sores, nosebleeds, sore throat and trouble swallowing.   Eyes: Negative for eye problems and icterus.  Respiratory:  Positive for stable dyspnea on exertion. Negative for cough, hemoptysis, and wheezing.   Cardiovascular: Negative for chest pain and leg swelling.  Gastrointestinal: Negative for abdominal pain, constipation, diarrhea, nausea and vomiting.  Genitourinary: Negative for bladder incontinence, difficulty urinating, dysuria, frequency and hematuria.   Musculoskeletal: Negative for back pain, gait problem, neck pain and neck stiffness.  Skin: Negative for itching  and rash.  Neurological: Negative for dizziness, extremity weakness, gait problem, headaches, light-headedness and seizures.  Hematological: Negative for adenopathy. Does not bruise/bleed easily.  Psychiatric/Behavioral: Negative for confusion, depression and sleep disturbance. The patient is not nervous/anxious.       PHYSICAL EXAMINATION:  Blood pressure (!) 155/79, pulse 100, temperature 98.4 F (36.9 C), temperature source Oral, resp. rate 17, weight 160 lb 12.8 oz (72.9 kg), SpO2 100 %.  ECOG PERFORMANCE STATUS: 1  Physical Exam  Constitutional: Oriented to person, place, and time and well-developed, well-nourished, and in no distress.  HENT:  Head: Normocephalic and atraumatic.  Mouth/Throat: Oropharynx is clear and moist. No oropharyngeal exudate.  Eyes: Conjunctivae are normal. Right eye exhibits no discharge. Left eye exhibits no discharge. No scleral icterus.  Neck: Normal range of motion. Neck supple.  Cardiovascular: Normal rate, regular rhythm, normal heart sounds and intact distal pulses.   Pulmonary/Chest: Effort normal and breath sounds normal. No respiratory distress. No wheezes. No rales.  Abdominal: Soft. Bowel sounds are normal. Exhibits no distension and no mass. There is no tenderness.  Musculoskeletal: Normal range of motion. Exhibits  no edema.  Lymphadenopathy:    No cervical adenopathy.  Neurological: Alert and oriented to person, place, and time. Exhibits normal muscle tone. Gait normal. Coordination normal.  Skin: Skin is warm and dry. No rash noted. Not diaphoretic. No erythema. No pallor.  Psychiatric: Mood, memory and judgment normal.  Vitals reviewed.  LABORATORY DATA: Lab Results  Component Value Date   WBC 3.2 (L) 10/11/2022   HGB 10.1 (L) 10/11/2022   HCT 29.4 (L) 10/11/2022   MCV 90.7 10/11/2022   PLT 210 10/11/2022      Chemistry      Component Value Date/Time   NA 137 10/11/2022 0900   K 4.2 10/11/2022 0900   CL 104 10/11/2022 0900   CO2 21 (L) 10/11/2022 0900   BUN 19 10/11/2022 0900   CREATININE 0.89 10/11/2022 0900   CREATININE 0.84 07/31/2021 1600      Component Value Date/Time   CALCIUM 9.3 10/11/2022 0900   ALKPHOS 84 10/11/2022 0900   AST 15 10/11/2022 0900   ALT 17 10/11/2022 0900   BILITOT 0.6 10/11/2022 0900       RADIOGRAPHIC STUDIES:  No results found.   ASSESSMENT/PLAN:  This is a very pleasant 72 year old Caucasian female with Stage IIb (T3, N0, M0)  non-small cell lung cancer, adenocarcinoma presented with large left lower lobe lung mass diagnosed in August 2023 status post left lower lobectomy with lymph node sampling under the care of Dr. Kipp Brood on June 16, 2022 with tumor size of 5.5 cm and visceropleural involvement.    Molecular studies by foundation 1 showed no actionable mutation and she has negative PD-L1 expression.   The patient started adjuvant systemic chemotherapy with carboplatin for AUC of 5 and Alimta 500 Mg/M2 status post 3 cycles started on August 09, 2022.    Labs were reviewed. Recommend she proceed with cycle #4 today as scheduled.  I will arrange for restaging CT scan of the chest to be performed in approximately 3 weeks.  We will see her back for follow-up visit at that time to review the results and the next steps in her care.  We  will monitor her labs weekly for the next 3 weeks or so.   She will monitor her blood sugar closely at home today and tomorrow due to her steroid premedication.   The patient was advised to  call immediately if she has any concerning symptoms in the interval. The patient voices understanding of current disease status and treatment options and is in agreement with the current care plan. All questions were answered. The patient knows to call the clinic with any problems, questions or concerns. We can certainly see the patient much sooner if necessary       Orders Placed This Encounter  Procedures   CBC with Differential ([facility] Only)    Standing Status:   Standing    Number of Occurrences:   3    Standing Expiration Date:   10/12/2023   CMP ([facility] only)    Standing Status:   Standing    Number of Occurrences:   3    Standing Expiration Date:   10/12/2023     The total time spent in the appointment was 20-29 minutes.   Sharna Gabrys L Kinesha Auten, PA-C 10/11/22

## 2022-10-11 ENCOUNTER — Other Ambulatory Visit: Payer: Self-pay

## 2022-10-11 ENCOUNTER — Inpatient Hospital Stay: Payer: PPO

## 2022-10-11 ENCOUNTER — Inpatient Hospital Stay (HOSPITAL_BASED_OUTPATIENT_CLINIC_OR_DEPARTMENT_OTHER): Payer: PPO | Admitting: Physician Assistant

## 2022-10-11 VITALS — BP 140/73 | HR 88 | Resp 16

## 2022-10-11 DIAGNOSIS — Z5111 Encounter for antineoplastic chemotherapy: Secondary | ICD-10-CM | POA: Diagnosis not present

## 2022-10-11 DIAGNOSIS — C3432 Malignant neoplasm of lower lobe, left bronchus or lung: Secondary | ICD-10-CM

## 2022-10-11 LAB — CMP (CANCER CENTER ONLY)
ALT: 17 U/L (ref 0–44)
AST: 15 U/L (ref 15–41)
Albumin: 4.1 g/dL (ref 3.5–5.0)
Alkaline Phosphatase: 84 U/L (ref 38–126)
Anion gap: 12 (ref 5–15)
BUN: 19 mg/dL (ref 8–23)
CO2: 21 mmol/L — ABNORMAL LOW (ref 22–32)
Calcium: 9.3 mg/dL (ref 8.9–10.3)
Chloride: 104 mmol/L (ref 98–111)
Creatinine: 0.89 mg/dL (ref 0.44–1.00)
GFR, Estimated: >60 mL/min (ref >60)
Glucose, Bld: 262 mg/dL — ABNORMAL HIGH (ref 70–99)
Potassium: 4.2 mmol/L (ref 3.5–5.1)
Sodium: 137 mmol/L (ref 135–145)
Total Bilirubin: 0.6 mg/dL (ref 0.3–1.2)
Total Protein: 7.3 g/dL (ref 6.5–8.1)

## 2022-10-11 LAB — CBC WITH DIFFERENTIAL (CANCER CENTER ONLY)
Abs Immature Granulocytes: 0.01 10*3/uL (ref 0.00–0.07)
Basophils Absolute: 0 10*3/uL (ref 0.0–0.1)
Basophils Relative: 0 %
Eosinophils Absolute: 0 10*3/uL (ref 0.0–0.5)
Eosinophils Relative: 1 %
HCT: 29.4 % — ABNORMAL LOW (ref 36.0–46.0)
Hemoglobin: 10.1 g/dL — ABNORMAL LOW (ref 12.0–15.0)
Immature Granulocytes: 0 %
Lymphocytes Relative: 15 %
Lymphs Abs: 0.5 10*3/uL — ABNORMAL LOW (ref 0.7–4.0)
MCH: 31.2 pg (ref 26.0–34.0)
MCHC: 34.4 g/dL (ref 30.0–36.0)
MCV: 90.7 fL (ref 80.0–100.0)
Monocytes Absolute: 0.2 10*3/uL (ref 0.1–1.0)
Monocytes Relative: 7 %
Neutro Abs: 2.5 10*3/uL (ref 1.7–7.7)
Neutrophils Relative %: 77 %
Platelet Count: 210 10*3/uL (ref 150–400)
RBC: 3.24 MIL/uL — ABNORMAL LOW (ref 3.87–5.11)
RDW: 18.5 % — ABNORMAL HIGH (ref 11.5–15.5)
WBC Count: 3.2 10*3/uL — ABNORMAL LOW (ref 4.0–10.5)
nRBC: 0 % (ref 0.0–0.2)

## 2022-10-11 MED ORDER — SODIUM CHLORIDE 0.9 % IV SOLN
150.0000 mg | Freq: Once | INTRAVENOUS | Status: AC
  Administered 2022-10-11: 150 mg via INTRAVENOUS
  Filled 2022-10-11: qty 150

## 2022-10-11 MED ORDER — SODIUM CHLORIDE 0.9 % IV SOLN
500.0000 mg/m2 | Freq: Once | INTRAVENOUS | Status: AC
  Administered 2022-10-11: 1000 mg via INTRAVENOUS
  Filled 2022-10-11: qty 40

## 2022-10-11 MED ORDER — SODIUM CHLORIDE 0.9 % IV SOLN: Freq: Once | INTRAVENOUS | Status: AC

## 2022-10-11 MED ORDER — PALONOSETRON HCL INJECTION 0.25 MG/5ML
0.2500 mg | Freq: Once | INTRAVENOUS | Status: AC
  Administered 2022-10-11: 0.25 mg via INTRAVENOUS
  Filled 2022-10-11: qty 5

## 2022-10-11 MED ORDER — SODIUM CHLORIDE 0.9 % IV SOLN
440.0000 mg | Freq: Once | INTRAVENOUS | Status: AC
  Administered 2022-10-11: 450 mg via INTRAVENOUS
  Filled 2022-10-11: qty 45

## 2022-10-11 MED ORDER — SODIUM CHLORIDE 0.9 % IV SOLN
10.0000 mg | Freq: Once | INTRAVENOUS | Status: AC
  Administered 2022-10-11: 10 mg via INTRAVENOUS
  Filled 2022-10-11: qty 10

## 2022-10-11 NOTE — Patient Instructions (Signed)
[facility]  Discharge Instructions: Thank you for choosing [facility] to provide your oncology and hematology care.   If you have a lab appointment with the [facility], please go directly to the [facility] and check in at the registration area.   Wear comfortable clothing and clothing appropriate for easy access to any Portacath or PICC line.   We strive to give you quality time with your provider. You may need to reschedule your appointment if you arrive late (15 or more minutes).  Arriving late affects you and other patients whose appointments are after yours.  Also, if you miss three or more appointments without notifying the office, you may be dismissed from the clinic at the provider's discretion.      For prescription refill requests, have your pharmacy contact our office and allow 72 hours for refills to be completed.    Today you received the following chemotherapy and/or immunotherapy agents: Alimta/Carboplatin   To help prevent nausea and vomiting after your treatment, we encourage you to take your nausea medication as directed.  BELOW ARE SYMPTOMS THAT SHOULD BE REPORTED IMMEDIATELY: *FEVER GREATER THAN 100.4 F (38 C) OR HIGHER *CHILLS OR SWEATING *NAUSEA AND VOMITING THAT IS NOT CONTROLLED WITH YOUR NAUSEA MEDICATION *UNUSUAL SHORTNESS OF BREATH *UNUSUAL BRUISING OR BLEEDING *URINARY PROBLEMS (pain or burning when urinating, or frequent urination) *BOWEL PROBLEMS (unusual diarrhea, constipation, pain near the anus) TENDERNESS IN MOUTH AND THROAT WITH OR WITHOUT PRESENCE OF ULCERS (sore throat, sores in mouth, or a toothache) UNUSUAL RASH, SWELLING OR PAIN  UNUSUAL VAGINAL DISCHARGE OR ITCHING   Items with * indicate a potential emergency and should be followed up as soon as possible or go to the Emergency Department if any problems should occur.  Please show the CHEMOTHERAPY ALERT CARD or IMMUNOTHERAPY ALERT CARD at  check-in to the Emergency Department and triage nurse.  Should you have questions after your visit or need to cancel or reschedule your appointment, please contact [facility]  Dept: [phone]  and follow the prompts.  Office hours are 8:00 a.m. to 4:30 p.m. Monday - Friday. Please note that voicemails left after 4:00 p.m. may not be returned until the following business day.  We are closed weekends and major holidays. You have access to a nurse at all times for urgent questions. Please call the main number to the clinic Dept: [phone] and follow the prompts.   For any non-urgent questions, you may also contact your provider using MyChart. We now offer e-Visits for anyone 52 and older to request care online for non-urgent symptoms. For details visit [email or web address].   Also download the MyChart app! Go to the app store, search "MyChart", open the app, select , and log in with your MyChart username and password.

## 2022-10-18 ENCOUNTER — Inpatient Hospital Stay: Payer: PPO | Attending: Internal Medicine

## 2022-10-18 DIAGNOSIS — Z5111 Encounter for antineoplastic chemotherapy: Secondary | ICD-10-CM

## 2022-10-18 DIAGNOSIS — Z85118 Personal history of other malignant neoplasm of bronchus and lung: Secondary | ICD-10-CM | POA: Insufficient documentation

## 2022-10-18 DIAGNOSIS — R110 Nausea: Secondary | ICD-10-CM | POA: Diagnosis not present

## 2022-10-18 DIAGNOSIS — Z902 Acquired absence of lung [part of]: Secondary | ICD-10-CM | POA: Insufficient documentation

## 2022-10-18 LAB — CMP (CANCER CENTER ONLY)
ALT: 22 U/L (ref 0–44)
AST: 14 U/L — ABNORMAL LOW (ref 15–41)
Albumin: 3.9 g/dL (ref 3.5–5.0)
Alkaline Phosphatase: 76 U/L (ref 38–126)
Anion gap: 9 (ref 5–15)
BUN: 22 mg/dL (ref 8–23)
CO2: 25 mmol/L (ref 22–32)
Calcium: 8.6 mg/dL — ABNORMAL LOW (ref 8.9–10.3)
Chloride: 104 mmol/L (ref 98–111)
Creatinine: 0.83 mg/dL (ref 0.44–1.00)
GFR, Estimated: >60 mL/min (ref >60)
Glucose, Bld: 83 mg/dL (ref 70–99)
Potassium: 4.2 mmol/L (ref 3.5–5.1)
Sodium: 138 mmol/L (ref 135–145)
Total Bilirubin: 0.5 mg/dL (ref 0.3–1.2)
Total Protein: 6.4 g/dL — ABNORMAL LOW (ref 6.5–8.1)

## 2022-10-18 LAB — CBC WITH DIFFERENTIAL (CANCER CENTER ONLY)
Abs Immature Granulocytes: 0.03 10*3/uL (ref 0.00–0.07)
Basophils Absolute: 0 10*3/uL (ref 0.0–0.1)
Basophils Relative: 0 %
Eosinophils Absolute: 0 10*3/uL (ref 0.0–0.5)
Eosinophils Relative: 1 %
HCT: 29.4 % — ABNORMAL LOW (ref 36.0–46.0)
Hemoglobin: 10.1 g/dL — ABNORMAL LOW (ref 12.0–15.0)
Immature Granulocytes: 1 %
Lymphocytes Relative: 52 %
Lymphs Abs: 1.6 10*3/uL (ref 0.7–4.0)
MCH: 31.4 pg (ref 26.0–34.0)
MCHC: 34.4 g/dL (ref 30.0–36.0)
MCV: 91.3 fL (ref 80.0–100.0)
Monocytes Absolute: 0.3 10*3/uL (ref 0.1–1.0)
Monocytes Relative: 8 %
Neutro Abs: 1.2 10*3/uL — ABNORMAL LOW (ref 1.7–7.7)
Neutrophils Relative %: 38 %
Platelet Count: 203 10*3/uL (ref 150–400)
RBC: 3.22 MIL/uL — ABNORMAL LOW (ref 3.87–5.11)
RDW: 17.9 % — ABNORMAL HIGH (ref 11.5–15.5)
WBC Count: 3.1 10*3/uL — ABNORMAL LOW (ref 4.0–10.5)
nRBC: 0 % (ref 0.0–0.2)

## 2022-10-19 ENCOUNTER — Telehealth: Payer: Self-pay | Admitting: Internal Medicine

## 2022-10-19 NOTE — Telephone Encounter (Signed)
Called patient regarding upcoming February appointments, patient is notified.

## 2022-10-22 ENCOUNTER — Other Ambulatory Visit: Payer: Self-pay | Admitting: Physician Assistant

## 2022-10-22 DIAGNOSIS — C3432 Malignant neoplasm of lower lobe, left bronchus or lung: Secondary | ICD-10-CM

## 2022-10-25 ENCOUNTER — Inpatient Hospital Stay: Payer: PPO

## 2022-10-25 DIAGNOSIS — Z5111 Encounter for antineoplastic chemotherapy: Secondary | ICD-10-CM

## 2022-10-25 DIAGNOSIS — Z85118 Personal history of other malignant neoplasm of bronchus and lung: Secondary | ICD-10-CM | POA: Diagnosis not present

## 2022-10-25 LAB — CMP (CANCER CENTER ONLY)
ALT: 18 U/L (ref 0–44)
AST: 17 U/L (ref 15–41)
Albumin: 3.7 g/dL (ref 3.5–5.0)
Alkaline Phosphatase: 75 U/L (ref 38–126)
Anion gap: 9 (ref 5–15)
BUN: 11 mg/dL (ref 8–23)
CO2: 26 mmol/L (ref 22–32)
Calcium: 7.8 mg/dL — ABNORMAL LOW (ref 8.9–10.3)
Chloride: 107 mmol/L (ref 98–111)
Creatinine: 0.78 mg/dL (ref 0.44–1.00)
GFR, Estimated: >60 mL/min (ref >60)
Glucose, Bld: 115 mg/dL — ABNORMAL HIGH (ref 70–99)
Potassium: 3.8 mmol/L (ref 3.5–5.1)
Sodium: 142 mmol/L (ref 135–145)
Total Bilirubin: 0.4 mg/dL (ref 0.3–1.2)
Total Protein: 6.3 g/dL — ABNORMAL LOW (ref 6.5–8.1)

## 2022-10-25 LAB — CBC WITH DIFFERENTIAL (CANCER CENTER ONLY)
Abs Immature Granulocytes: 0.01 10*3/uL (ref 0.00–0.07)
Basophils Absolute: 0 10*3/uL (ref 0.0–0.1)
Basophils Relative: 0 %
Eosinophils Absolute: 0 10*3/uL (ref 0.0–0.5)
Eosinophils Relative: 0 %
HCT: 25 % — ABNORMAL LOW (ref 36.0–46.0)
Hemoglobin: 8.7 g/dL — ABNORMAL LOW (ref 12.0–15.0)
Immature Granulocytes: 0 %
Lymphocytes Relative: 25 %
Lymphs Abs: 0.8 10*3/uL (ref 0.7–4.0)
MCH: 32.2 pg (ref 26.0–34.0)
MCHC: 34.8 g/dL (ref 30.0–36.0)
MCV: 92.6 fL (ref 80.0–100.0)
Monocytes Absolute: 0.3 10*3/uL (ref 0.1–1.0)
Monocytes Relative: 11 %
Neutro Abs: 1.9 10*3/uL (ref 1.7–7.7)
Neutrophils Relative %: 64 %
Platelet Count: 59 10*3/uL — ABNORMAL LOW (ref 150–400)
RBC: 2.7 MIL/uL — ABNORMAL LOW (ref 3.87–5.11)
RDW: 18.2 % — ABNORMAL HIGH (ref 11.5–15.5)
WBC Count: 3 10*3/uL — ABNORMAL LOW (ref 4.0–10.5)
nRBC: 0 % (ref 0.0–0.2)

## 2022-10-28 ENCOUNTER — Ambulatory Visit (HOSPITAL_COMMUNITY)
Admission: RE | Admit: 2022-10-28 | Discharge: 2022-10-28 | Disposition: A | Discharge status: Home / Self Care | Payer: PPO | Source: Ambulatory Visit | Attending: Physician Assistant | Admitting: Physician Assistant

## 2022-10-28 DIAGNOSIS — C3490 Malignant neoplasm of unspecified part of unspecified bronchus or lung: Secondary | ICD-10-CM | POA: Diagnosis not present

## 2022-10-28 DIAGNOSIS — J432 Centrilobular emphysema: Secondary | ICD-10-CM | POA: Diagnosis not present

## 2022-10-28 DIAGNOSIS — C3432 Malignant neoplasm of lower lobe, left bronchus or lung: Secondary | ICD-10-CM | POA: Diagnosis not present

## 2022-10-28 MED ORDER — SODIUM CHLORIDE (PF) 0.9 % IJ SOLN
INTRAMUSCULAR | Status: AC
  Filled 2022-10-28: qty 50

## 2022-10-28 MED ORDER — IOHEXOL 300 MG/ML  SOLN
75.0000 mL | Freq: Once | INTRAMUSCULAR | Status: AC | PRN
  Administered 2022-10-28: 75 mL via INTRAVENOUS

## 2022-10-30 NOTE — Progress Notes (Unsigned)
[facility] OFFICE PROGRESS NOTE  Pleas Koch, NP [place] [id number]  DIAGNOSIS:  Stage IIb (T3, N0, M0)  non-small cell lung cancer, adenocarcinoma presented with large left lower lobe lung mass diagnosed in August 2023  Molecular studies by foundation 1 showed no actionable mutation and she has negative PD-L1 expression.  PRIOR THERAPY: 1) Status post left lower lobectomy with lymph node sampling under the care of Dr. Kipp Brood on June 16, 2022 with tumor size of 5.5 cm and visceropleural involvement.   2) Adjuvant systemic chemotherapy with carboplatin for AUC of 5 and Alimta 500 Mg/M2. Last dose 10/11/22. Status post 4 cycles.   CURRENT THERAPY: Observation   INTERVAL HISTORY: Megan [name] 72 y.o. female returns to the clinic today for follow-up visit.  The patient recently completed 4 cycles of adjuvant chemotherapy and tolerated it well except for mild fatigue.  And some taste alterations.  Today she denies any fever, chills, night sweats, or unexplained weight loss.  She reports baseline mild dyspnea on exertion without any cough, hemoptysis, or chest pain.  She had some controlled nausea without any vomiting.  She uses laxatives as needed for constipation.  Denies any headache or visual changes except for baseline visual changes from her cataracts.  She has an upcoming appointment with her eye doctor.  She is following closely with her PCP for steroid-induced hyperglycemia.  She recently had a restaging CT scan performed.  She is here today for evaluation and to review her scan results.  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   Constipation due to pain medication    Debility    Depression    Dyspnea    W/ PHYS CONDITION    Emphysema of lung  ([facility])    Foul smelling urine 07/22/2020   GERD (gastroesophageal reflux disease)    History of bronchitis    "not since I quit smoking" (07/26/2017)   History of kidney stones    History of shingles    Hyperlipidemia    takes Fish Oil daily   Hypertension    Hypoalbuminemia due to protein-calorie malnutrition ([facility])    L3 vertebral fracture ([facility]) 09/01/2017   Lung cancer ([facility]) 2023   Lung nodule 04/21/2022   Lung nodule seen on imaging study 08/14/2021   Added automatically from request for surgery [id number]   Myelopathy ([facility]) 12/21/2016   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  MEDICATIONS:  Current Outpatient Medications  Medication Sig Dispense Refill   acetaminophen (TYLENOL) 500 MG tablet Take 500 mg by mouth every 8 (eight) hours as needed (for pain).     amLODipine (NORVASC) 10 MG  tablet Take 1 tablet (10 mg total) by mouth daily. For blood pressure 90 tablet 3   atorvastatin (LIPITOR) 40 MG tablet TAKE 1 TABLET(40 MG) BY MOUTH DAILY FOR CHOLESTEROL 90 tablet 2   baclofen (LIORESAL) 10 MG tablet Take 1 tablet (10 mg total) by mouth at bedtime. For muscle spasms. 90 each 2   Biotin 5000 MCG CAPS Take 5,000 mcg by mouth every morning.      cetirizine (ZYRTEC) 10 MG tablet Take 1 tablet (10 mg total) by mouth daily. For allergies 90 tablet 0   Cholecalciferol (VITAMIN D) 50 MCG (2000 UT) tablet Take  2,000 Units by mouth daily.     Coenzyme Q10 (COQ10) 100 MG CAPS Take 100 mg by mouth every evening.     dexamethasone (DECADRON) 4 MG tablet 4 mg p.o. twice daily the day before, day of and day after chemotherapy every 3 weeks 30 tablet 0   esomeprazole (NEXIUM 24HR) 20 MG capsule Take 1 capsule (20 mg total) by mouth daily at 12 noon. 90 capsule 1   fluocinonide cream (LIDEX) 8.36 % Apply 1 Application topically 2 (two) times daily as needed (irritation).     folic acid (FOLVITE) 1 MG tablet Take 1 tablet (1 mg total) by mouth daily. 30 tablet 4   gabapentin (NEURONTIN) 300 MG capsule TAKE 1 CAPSULE(300 MG) BY MOUTH TWICE DAILY FOR PAIN 180 capsule 2   glipiZIDE (GLUCOTROL) 5 MG tablet Take 1 tablet (5 mg total) by mouth 2 (two) times daily before a meal. for diabetes. 180 tablet 0   lisinopril (ZESTRIL) 20 MG tablet TAKE 1 TABLET(20 MG) BY MOUTH DAILY FOR BLOOD PRESSURE 90 tablet 3   metFORMIN (GLUCOPHAGE) 1000 MG tablet Take 0.5 tablets (500 mg total) by mouth 2 (two) times daily with a meal. for diabetes. 90 tablet 0   nystatin-triamcinolone ointment (MYCOLOG) Apply 1 Application topically 2 (two) times daily as needed (irritation).     oxybutynin (DITROPAN-XL) 5 MG 24 hr tablet TAKE 1 TABLET(5 MG) BY MOUTH AT BEDTIME FOR OVERACTIVE BLADDER 90 tablet 2   oxybutynin (OXYTROL) 3.9 MG/24HR Place 1 patch onto the skin See admin instructions. Change every 5 days     Polyethyl Glycol-Propyl Glycol (LUBRICANT EYE DROPS) 0.4-0.3 % SOLN Place 1-2 drops into both eyes 2 (two) times daily.     prochlorperazine (COMPAZINE) 10 MG tablet TAKE 1 TABLET(10 MG) BY MOUTH EVERY 6 HOURS AS NEEDED FOR NAUSEA OR VOMITING 30 tablet 0   senna-docusate (SENOKOT-S) 8.6-50 MG tablet Take 2 tablets by mouth 2 (two) times daily. (Patient taking differently: Take 1 tablet by mouth every other day.)     No current facility-administered medications for this visit.    SURGICAL HISTORY:  Past Surgical History:  Procedure  Laterality Date   ABDOMINAL EXPOSURE N/A 07/26/2017   Procedure: ABDOMINAL EXPOSURE;  Surgeon: Angelia Mould, MD;  Location: [facility];  Service: Vascular;  Laterality: N/A;   ANTERIOR CERVICAL DECOMP/DISCECTOMY FUSION  2001   ANTERIOR LUMBAR FUSION N/A 07/26/2017   Procedure: Lumbar five-Sacral one Anterior lumbar interbody fusion with Dr. Deitra Mayo for approach;  Surgeon: Ditty, Kevan Ny, MD;  Location: [facility];  Service: Neurosurgery;  Laterality: N/A;   APPLICATION OF ROBOTIC ASSISTANCE FOR SPINAL PROCEDURE  12/19/2016   Procedure: APPLICATION OF ROBOTIC ASSISTANCE FOR SPINAL PROCEDURE;  Surgeon: Kevan Ny Ditty, MD;  Location: [facility];  Service: Neurosurgery;;   APPLICATION OF ROBOTIC ASSISTANCE FOR SPINAL PROCEDURE N/A 07/26/2017   Procedure: APPLICATION  OF ROBOTIC ASSISTANCE FOR SPINAL PROCEDURE;  Surgeon: Ditty, Kevan Ny, MD;  Location: [facility];  Service: Neurosurgery;  Laterality: N/A;   APPLICATION OF ROBOTIC ASSISTANCE FOR SPINAL PROCEDURE N/A 09/02/2017   Procedure: APPLICATION OF ROBOTIC ASSISTANCE FOR SPINAL PROCEDURE;  Surgeon: Ditty, Kevan Ny, MD;  Location: [facility];  Service: Neurosurgery;  Laterality: N/A;   BACK SURGERY     BLADDER SUSPENSION  1991   tack   BRONCHIAL BIOPSY  09/15/2021   Procedure: BRONCHIAL BIOPSIES;  Surgeon: Garner Nash, DO;  Location: [facility];  Service: Pulmonary;;   BRONCHIAL BIOPSY  05/04/2022   Procedure: BRONCHIAL BIOPSIES;  Surgeon: Garner Nash, DO;  Location: [facility];  Service: Pulmonary;;   BRONCHIAL BRUSHINGS  09/15/2021   Procedure: BRONCHIAL BRUSHINGS;  Surgeon: Garner Nash, DO;  Location: [facility];  Service: Pulmonary;;   BRONCHIAL NEEDLE ASPIRATION BIOPSY  09/15/2021   Procedure: BRONCHIAL NEEDLE ASPIRATION BIOPSIES;  Surgeon: Garner Nash, DO;  Location: [facility];  Service: Pulmonary;;   BRONCHIAL NEEDLE ASPIRATION BIOPSY  05/04/2022   Procedure: BRONCHIAL NEEDLE ASPIRATION BIOPSIES;   Surgeon: Garner Nash, DO;  Location: [facility];  Service: Pulmonary;;   BRONCHIAL WASHINGS  09/15/2021   Procedure: BRONCHIAL WASHINGS;  Surgeon: Garner Nash, DO;  Location: [facility];  Service: Pulmonary;;   FIDUCIAL MARKER PLACEMENT  09/15/2021   Procedure: FIDUCIAL MARKER PLACEMENT;  Surgeon: Garner Nash, DO;  Location: [facility];  Service: Pulmonary;;   INGUINAL HERNIA REPAIR Right 1991   INTERCOSTAL NERVE BLOCK Left 06/16/2022   Procedure: INTERCOSTAL NERVE BLOCK;  Surgeon: [name], MD;  Location: [facility];  Service: Thoracic;  Laterality: Left;   LUMBAR FUSION  2016; 2017; 07/26/2017   L4-5; L2-3; L5-S1   LYMPH NODE DISSECTION Left 06/16/2022   Procedure: LYMPH NODE DISSECTION;  Surgeon: [name], MD;  Location: [facility];  Service: Thoracic;  Laterality: Left;   POSTERIOR LUMBAR FUSION 4 WITH HARDWARE REMOVAL N/A 09/02/2017   Procedure: Lumbar three-four redo laminectomy; Repositioning of Left Sacral two screw; Extension of lumbar fusion to Thoracic twelve;  Surgeon: Ditty, Kevan Ny, MD;  Location: [facility];  Service: Neurosurgery;  Laterality: N/A;   TONSILLECTOMY AND ADENOIDECTOMY  1959   TUMOR EXCISION     WERTHIN'S TUMORS BOTH SIDES OF NECK   VAGINAL HYSTERECTOMY  1979   VIDEO BRONCHOSCOPY WITH RADIAL ENDOBRONCHIAL ULTRASOUND  09/15/2021   Procedure: VIDEO BRONCHOSCOPY WITH RADIAL ENDOBRONCHIAL ULTRASOUND;  Surgeon: Garner Nash, DO;  Location: [facility];  Service: Pulmonary;;   VIDEO BRONCHOSCOPY WITH RADIAL ENDOBRONCHIAL ULTRASOUND  05/04/2022   Procedure: VIDEO BRONCHOSCOPY WITH RADIAL ENDOBRONCHIAL ULTRASOUND;  Surgeon: Garner Nash, DO;  Location: [facility];  Service: Pulmonary;;    REVIEW OF SYSTEMS:   Review of Systems  Constitutional: Negative for appetite change, chills, fatigue, fever and unexpected weight change.  HENT:   Negative for mouth sores, nosebleeds, sore throat and trouble swallowing.   Eyes: Negative for  eye problems and icterus.  Respiratory: Negative for cough, hemoptysis, shortness of breath and wheezing.   Cardiovascular: Negative for chest pain and leg swelling.  Gastrointestinal: Negative for abdominal pain, constipation, diarrhea, nausea and vomiting.  Genitourinary: Negative for bladder incontinence, difficulty urinating, dysuria, frequency and hematuria.   Musculoskeletal: Negative for back pain, gait problem, neck pain and neck stiffness.  Skin: Negative for itching and rash.  Neurological: Negative for dizziness, extremity weakness, gait problem, headaches, light-headedness and seizures.  Hematological: Negative for adenopathy.  Does not bruise/bleed easily.  Psychiatric/Behavioral: Negative for confusion, depression and sleep disturbance. The patient is not nervous/anxious.     PHYSICAL EXAMINATION:  There were no vitals taken for this visit.  ECOG PERFORMANCE STATUS: {CHL ONC ECOG [id number]  Physical Exam  Constitutional: Oriented to person, place, and time and well-developed, well-nourished, and in no distress. No distress.  HENT:  Head: Normocephalic and atraumatic.  Mouth/Throat: Oropharynx is clear and moist. No oropharyngeal exudate.  Eyes: Conjunctivae are normal. Right eye exhibits no discharge. Left eye exhibits no discharge. No scleral icterus.  Neck: Normal range of motion. Neck supple.  Cardiovascular: Normal rate, regular rhythm, normal heart sounds and intact distal pulses.   Pulmonary/Chest: Effort normal and breath sounds normal. No respiratory distress. No wheezes. No rales.  Abdominal: Soft. Bowel sounds are normal. Exhibits no distension and no mass. There is no tenderness.  Musculoskeletal: Normal range of motion. Exhibits no edema.  Lymphadenopathy:    No cervical adenopathy.  Neurological: Alert and oriented to person, place, and time. Exhibits normal muscle tone. Gait normal. Coordination normal.  Skin: Skin is warm and dry. No rash noted. Not  diaphoretic. No erythema. No pallor.  Psychiatric: Mood, memory and judgment normal.  Vitals reviewed.  LABORATORY DATA: Lab Results  Component Value Date   WBC 3.0 (L) 10/25/2022   HGB 8.7 (L) 10/25/2022   HCT 25.0 (L) 10/25/2022   MCV 92.6 10/25/2022   PLT 59 (L) 10/25/2022      Chemistry      Component Value Date/Time   NA 142 10/25/2022 1356   K 3.8 10/25/2022 1356   CL 107 10/25/2022 1356   CO2 26 10/25/2022 1356   BUN 11 10/25/2022 1356   CREATININE 0.78 10/25/2022 1356   CREATININE 0.84 07/31/2021 1600      Component Value Date/Time   CALCIUM 7.8 (L) 10/25/2022 1356   ALKPHOS 75 10/25/2022 1356   AST 17 10/25/2022 1356   ALT 18 10/25/2022 1356   BILITOT 0.4 10/25/2022 1356       RADIOGRAPHIC STUDIES:  CT Chest W Contrast  Result Date: 10/28/2022 CLINICAL DATA:  Non-small cell lung cancer, status post left lower lobectomy, chemotherapy complete EXAM: CT CHEST WITH CONTRAST TECHNIQUE: Multidetector CT imaging of the chest was performed during intravenous contrast administration. RADIATION DOSE REDUCTION: This exam was performed according to the departmental dose-optimization program which includes automated exposure control, adjustment of the mA and/or kV according to patient size and/or use of iterative reconstruction technique. CONTRAST:  30mL OMNIPAQUE IOHEXOL 300 MG/ML  SOLN COMPARISON:  PET-CT dated 04/09/2022 FINDINGS: Cardiovascular: The heart is normal in size. No pericardial effusion. No evidence of thoracic aortic aneurysm. Mediastinum/Nodes: No suspicious mediastinal lymphadenopathy. Visualized thyroid is unremarkable. Lungs/Pleura: Status post left lower lobectomy. Mild centrilobular emphysematous changes, upper lung predominant. No suspicious pulmonary nodules. No focal consolidation. No pleural effusion or pneumothorax. Upper Abdomen: Visualized upper abdomen is grossly unremarkable. Musculoskeletal: Thoracolumbar spine fixation hardware with degenerative  changes. Cervical spine fixation hardware. IMPRESSION: Status post left lower lobectomy. No evidence of recurrent or metastatic disease. Emphysema (ICD10-J43.9). Electronically Signed   By: Julian Hy M.D.   On: 10/28/2022 11:25     ASSESSMENT/PLAN:  This is a very pleasant 72 year old Caucasian female with Stage IIb (T3, N0, M0)  non-small cell lung cancer, adenocarcinoma presented with large left lower lobe lung mass diagnosed in August 2023 status post left lower lobectomy with lymph node sampling under the care of Dr. Kipp Brood on June 16, 2022 with tumor size of 5.5 cm and visceropleural involvement.    Molecular studies by [facility] 1 showed no actionable mutation and she has negative PD-L1 expression.    The patient completed 4 cycles adjuvant systemic chemotherapy with carboplatin for AUC of 5 and Alimta 500 Mg/M2. Her last dose was on 10/11/22.   She recently had a restaging CT scan performed. Dr. Julien Nordmann personally and independently reviewed the scan and discussed the results with the patient. The scan showed ***.   Dr. Julien Nordmann recommends she continue on observation with a restaging CT scan in 4 months.   We will see her back at that time.   The patient was advised to call immediately if she has any concerning symptoms in the interval. The patient voices understanding of current disease status and treatment options and is in agreement with the current care plan. All questions were answered. The patient knows to call the clinic with any problems, questions or concerns. We can certainly see the patient much sooner if necessary    No orders of the defined types were placed in this encounter.    I spent {CHL ONC TIME VISIT - MLJQG:[id number]} counseling the patient face to face. The total time spent in the appointment was {CHL ONC TIME VISIT - FXJOI:[id number]}.  Kirsten Mckone L Thelmer Legler, PA-C 10/30/22

## 2022-11-01 ENCOUNTER — Telehealth: Payer: Self-pay | Admitting: Physician Assistant

## 2022-11-01 DIAGNOSIS — E11319 Type 2 diabetes mellitus with unspecified diabetic retinopathy without macular edema: Secondary | ICD-10-CM | POA: Diagnosis not present

## 2022-11-01 LAB — HM DIABETES EYE EXAM

## 2022-11-01 NOTE — Telephone Encounter (Signed)
Rescheduled 02/20 appointment time per providers request. Left a voicemail.

## 2022-11-02 ENCOUNTER — Inpatient Hospital Stay: Payer: PPO

## 2022-11-02 ENCOUNTER — Inpatient Hospital Stay (HOSPITAL_BASED_OUTPATIENT_CLINIC_OR_DEPARTMENT_OTHER): Payer: PPO | Admitting: Physician Assistant

## 2022-11-02 ENCOUNTER — Other Ambulatory Visit: Payer: PPO

## 2022-11-02 VITALS — BP 123/60 | HR 87 | Temp 98.3°F | Resp 16 | Wt 162.5 lb

## 2022-11-02 DIAGNOSIS — C3432 Malignant neoplasm of lower lobe, left bronchus or lung: Secondary | ICD-10-CM | POA: Diagnosis not present

## 2022-11-02 DIAGNOSIS — Z85118 Personal history of other malignant neoplasm of bronchus and lung: Secondary | ICD-10-CM | POA: Diagnosis not present

## 2022-11-02 DIAGNOSIS — Z5111 Encounter for antineoplastic chemotherapy: Secondary | ICD-10-CM

## 2022-11-02 LAB — CMP (CANCER CENTER ONLY)
ALT: 16 U/L (ref 0–44)
AST: 15 U/L (ref 15–41)
Albumin: 3.9 g/dL (ref 3.5–5.0)
Alkaline Phosphatase: 78 U/L (ref 38–126)
Anion gap: 8 (ref 5–15)
BUN: 11 mg/dL (ref 8–23)
CO2: 27 mmol/L (ref 22–32)
Calcium: 8 mg/dL — ABNORMAL LOW (ref 8.9–10.3)
Chloride: 106 mmol/L (ref 98–111)
Creatinine: 0.87 mg/dL (ref 0.44–1.00)
GFR, Estimated: >60 mL/min (ref >60)
Glucose, Bld: 98 mg/dL (ref 70–99)
Potassium: 4.2 mmol/L (ref 3.5–5.1)
Sodium: 141 mmol/L (ref 135–145)
Total Bilirubin: 0.4 mg/dL (ref 0.3–1.2)
Total Protein: 6.4 g/dL — ABNORMAL LOW (ref 6.5–8.1)

## 2022-11-02 LAB — CBC WITH DIFFERENTIAL (CANCER CENTER ONLY)
Abs Immature Granulocytes: 0 10*3/uL (ref 0.00–0.07)
Basophils Absolute: 0 10*3/uL (ref 0.0–0.1)
Basophils Relative: 0 %
Eosinophils Absolute: 0 10*3/uL (ref 0.0–0.5)
Eosinophils Relative: 1 %
HCT: 25.9 % — ABNORMAL LOW (ref 36.0–46.0)
Hemoglobin: 8.9 g/dL — ABNORMAL LOW (ref 12.0–15.0)
Immature Granulocytes: 0 %
Lymphocytes Relative: 32 %
Lymphs Abs: 1 10*3/uL (ref 0.7–4.0)
MCH: 33 pg (ref 26.0–34.0)
MCHC: 34.4 g/dL (ref 30.0–36.0)
MCV: 95.9 fL (ref 80.0–100.0)
Monocytes Absolute: 0.6 10*3/uL (ref 0.1–1.0)
Monocytes Relative: 18 %
Neutro Abs: 1.6 10*3/uL — ABNORMAL LOW (ref 1.7–7.7)
Neutrophils Relative %: 49 %
Platelet Count: 132 10*3/uL — ABNORMAL LOW (ref 150–400)
RBC: 2.7 MIL/uL — ABNORMAL LOW (ref 3.87–5.11)
RDW: 20.4 % — ABNORMAL HIGH (ref 11.5–15.5)
WBC Count: 3.3 10*3/uL — ABNORMAL LOW (ref 4.0–10.5)
nRBC: 0 % (ref 0.0–0.2)

## 2022-11-03 ENCOUNTER — Other Ambulatory Visit: Payer: Self-pay

## 2022-11-14 ENCOUNTER — Other Ambulatory Visit: Payer: Self-pay | Admitting: Primary Care

## 2022-11-14 DIAGNOSIS — E1165 Type 2 diabetes mellitus with hyperglycemia: Secondary | ICD-10-CM

## 2022-12-03 ENCOUNTER — Other Ambulatory Visit: Payer: Self-pay | Admitting: Primary Care

## 2022-12-03 DIAGNOSIS — Z1231 Encounter for screening mammogram for malignant neoplasm of breast: Secondary | ICD-10-CM

## 2022-12-09 ENCOUNTER — Telehealth: Payer: Self-pay | Admitting: Primary Care

## 2022-12-09 NOTE — Telephone Encounter (Signed)
Contacted [name] to schedule their annual wellness visit. Appointment made for 01/04/2023.  [facility] Direct Dial: [phone]

## 2022-12-22 DIAGNOSIS — H40033 Anatomical narrow angle, bilateral: Secondary | ICD-10-CM | POA: Diagnosis not present

## 2022-12-22 DIAGNOSIS — H16223 Keratoconjunctivitis sicca, not specified as Sjogren's, bilateral: Secondary | ICD-10-CM | POA: Diagnosis not present

## 2022-12-23 ENCOUNTER — Other Ambulatory Visit: Payer: Self-pay | Admitting: Internal Medicine

## 2022-12-29 ENCOUNTER — Encounter: Payer: Self-pay | Admitting: Primary Care

## 2022-12-29 ENCOUNTER — Ambulatory Visit (INDEPENDENT_AMBULATORY_CARE_PROVIDER_SITE_OTHER): Payer: PPO | Admitting: Primary Care

## 2022-12-29 VITALS — BP 136/72 | HR 75 | Temp 98.1°F | Ht 68.0 in | Wt 163.0 lb

## 2022-12-29 DIAGNOSIS — E785 Hyperlipidemia, unspecified: Secondary | ICD-10-CM | POA: Diagnosis not present

## 2022-12-29 DIAGNOSIS — M62838 Other muscle spasm: Secondary | ICD-10-CM | POA: Diagnosis not present

## 2022-12-29 DIAGNOSIS — E1165 Type 2 diabetes mellitus with hyperglycemia: Secondary | ICD-10-CM | POA: Diagnosis not present

## 2022-12-29 DIAGNOSIS — R300 Dysuria: Secondary | ICD-10-CM | POA: Diagnosis not present

## 2022-12-29 LAB — POC URINALSYSI DIPSTICK (AUTOMATED)
Bilirubin, UA: NEGATIVE
Blood, UA: NEGATIVE
Glucose, UA: NEGATIVE
Ketones, UA: NEGATIVE
Nitrite, UA: POSITIVE
Protein, UA: NEGATIVE
Spec Grav, UA: 1.015 (ref 1.010–1.025)
Urobilinogen, UA: 0.2 E.U./dL
pH, UA: 6 (ref 5.0–8.0)

## 2022-12-29 LAB — LIPID PANEL
Cholesterol: 146 mg/dL (ref 0–200)
HDL: 45.1 mg/dL (ref >39.00)
LDL Cholesterol: 63 mg/dL (ref 0–99)
NonHDL: 101.04
Total CHOL/HDL Ratio: 3
Triglycerides: 190 mg/dL — ABNORMAL HIGH (ref 0.0–149.0)
VLDL: 38 mg/dL (ref 0.0–40.0)

## 2022-12-29 LAB — HEMOGLOBIN A1C: Hgb A1c MFr Bld: 5.3 % (ref 4.6–6.5)

## 2022-12-29 MED ORDER — BACLOFEN 10 MG PO TABS
10.0000 mg | ORAL_TABLET | Freq: Two times a day (BID) | ORAL | 1 refills | Status: DC
Start: 2022-12-29 — End: 2023-05-25

## 2022-12-29 MED ORDER — NITROFURANTOIN MONOHYD MACRO 100 MG PO CAPS
100.0000 mg | ORAL_CAPSULE | Freq: Two times a day (BID) | ORAL | 0 refills | Status: AC
Start: 2022-12-29 — End: 2023-01-03

## 2022-12-29 NOTE — Assessment & Plan Note (Signed)
Repeat A1C pending.  Continue glipizide 5 mg BID and metformin 1000 mg BID.   Follow up in 6 months.

## 2022-12-29 NOTE — Progress Notes (Signed)
Subjective:    Patient ID: Megan [name], female    DOB: Jan 31, 1951, 72 y.o.   MRN: [id number]  Dysuria  Pertinent negatives include no chills, frequency or nausea.    Megan [name] is a very pleasant 72 y.o. female with a history of hypertension, type 2 diabetes, overactive bladder, chronic pain syndrome, lower extremity muscle spasms, cystitis who presents today for follow up of diabetes and to discuss foul smelling urine.  She is also needing refills of her Baclofen. Her husband joins [place] today.  1) Type 2 Diabetes:  Current medications include: glipizide 5 mg BID, metformin 1000 mg BID  She is checking her blood glucose 1-2 times daily and is getting readings of:  AM fasting: low 100's 2 hours after lunch: 120's.   Lowest reading: 90  Last A1C: 7.1 in September 2023 Last Eye Exam: UTD. Last Foot Exam: UTD Pneumonia Vaccination: UTD Urine Microalbumin: UTD Statin: atorvastatin   Dietary changes since last visit: None.    Exercise: None  2) Foul Smelling Urine: Symptom onset 4-5 days ago with dysuria and foul smelling urine. She has a history of cystitis, culture positive. Last culture positive UTI was in October 2023, E. Coli with multiple resistance.   She denies hematuria, nausea, fevers.    Review of Systems  Constitutional:  Negative for chills and fever.  Cardiovascular:  Negative for chest pain.  Gastrointestinal:  Negative for nausea.  Genitourinary:  Positive for dysuria. Negative for frequency.       Fouls smelling urine  Musculoskeletal:  Positive for back pain.  Neurological:  Positive for numbness.         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   Constipation due to pain medication    Debility    Depression    Dyspnea    W/ PHYS CONDITION    Emphysema of  lung    Foul smelling urine 07/22/2020   GERD (gastroesophageal reflux disease)    History of bronchitis    "not since I quit smoking" (07/26/2017)   History of kidney stones    History of shingles    Hyperlipidemia    takes Fish Oil daily   Hypertension    Hypoalbuminemia due to protein-calorie malnutrition    L3 vertebral fracture 09/01/2017   Lung cancer 2023   Lung nodule 04/21/2022   Lung nodule seen on imaging study 08/14/2021   Added automatically from request for surgery [id number]   Myelopathy 12/21/2016   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   Neuromuscular disorder    tingling toes   Paraparesis 12/15/2016   Paraparesis of both lower limbs 12/15/2016   Pneumonia 2009   PONV (postoperative nausea and vomiting)    Radiculopathy 07/28/2017   Restless leg    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   Type 2 diabetes mellitus    Uterine cancer 1979   S/P hysterectomy   Weakness    numbness and tingling in both feet r/t back    Social History   Socioeconomic History   Marital status: Married    Spouse name: Marita [place]   Number of children: 1   Years of education: 12   Highest education level: 12th grade  Occupational History   Not on  file  Tobacco Use   Smoking status: Former    Packs/day: 1.50    Years: 35.00    Additional pack years: 0.00    Total pack years: 52.50    Types: Cigarettes    Quit date: 05/13/2006    Years since quitting: 16.6   Smokeless tobacco: Never  Vaping Use   Vaping Use: Never used  Substance and Sexual Activity   Alcohol use: Yes    Alcohol/week: 2.0 standard drinks of alcohol    Types: 2 Glasses of wine per week   Drug use: No   Sexual activity: Not Currently    Birth control/protection: Surgical  Other Topics Concern   Not on file  Social History Narrative   Lives w/ husband   Married.   1 child, 1 grandchildren.   Retired. Once worked  for [employer].   Enjoys reading.    Social Determinants of Health   Financial Resource Strain: Low Risk  (12/27/2022)   Overall Financial Resource Strain (CARDIA)    Difficulty of Paying Living Expenses: Not hard at all  Food Insecurity: No Food Insecurity (12/27/2022)   Hunger Vital Sign    Worried About Running Out of Food in the Last Year: Never true    Ran Out of Food in the Last Year: Never true  Transportation Needs: No Transportation Needs (12/27/2022)   PRAPARE - Administrator, Civil Service (Medical): No    Lack of Transportation (Non-Medical): No  Physical Activity: Unknown (12/27/2022)   Exercise Vital Sign    Days of Exercise per Week: Patient declined    Minutes of Exercise per Session: 0 min  Recent Concern: Physical Activity - Inactive (10/11/2022)   Exercise Vital Sign    Days of Exercise per Week: 0 days    Minutes of Exercise per Session: 0 min  Stress: Stress Concern Present (12/27/2022)   [employer] of Occupational Health - Occupational Stress Questionnaire    Feeling of Stress : To some extent  Social Connections: Moderately Isolated (12/27/2022)   Social Connection and Isolation Panel [NHANES]    Frequency of Communication with Friends and Family: More than three times a week    Frequency of Social Gatherings with Friends and Family: Once a week    Attends Religious Services: Never    Database administrator or Organizations: No    Attends Banker Meetings: Never    Marital Status: Married  Catering manager Violence: Not At Risk (10/11/2022)   Humiliation, Afraid, Rape, and Kick questionnaire    Fear of Current or Ex-Partner: No    Emotionally Abused: No    Physically Abused: No    Sexually Abused: No    Past Surgical History:  Procedure Laterality Date   ABDOMINAL EXPOSURE N/A 07/26/2017   Procedure: ABDOMINAL EXPOSURE;  Surgeon: Chuck Hint, MD;  Location: [facility] OR;  Service: Vascular;  Laterality: N/A;   ANTERIOR  CERVICAL DECOMP/DISCECTOMY FUSION  2001   ANTERIOR LUMBAR FUSION N/A 07/26/2017   Procedure: Lumbar five-Sacral one Anterior lumbar interbody fusion with Dr. Waverly Ferrari for approach;  Surgeon: Ditty, Loura Halt, MD;  Location: [facility] OR;  Service: Neurosurgery;  Laterality: N/A;   APPLICATION OF ROBOTIC ASSISTANCE FOR SPINAL PROCEDURE  12/19/2016   Procedure: APPLICATION OF ROBOTIC ASSISTANCE FOR SPINAL PROCEDURE;  Surgeon: Loura Halt Ditty, MD;  Location: [facility] OR;  Service: Neurosurgery;;   APPLICATION OF ROBOTIC ASSISTANCE FOR SPINAL PROCEDURE N/A 07/26/2017   Procedure: APPLICATION OF ROBOTIC ASSISTANCE  FOR SPINAL PROCEDURE;  Surgeon: Ditty, Loura Halt, MD;  Location: [facility] OR;  Service: Neurosurgery;  Laterality: N/A;   APPLICATION OF ROBOTIC ASSISTANCE FOR SPINAL PROCEDURE N/A 09/02/2017   Procedure: APPLICATION OF ROBOTIC ASSISTANCE FOR SPINAL PROCEDURE;  Surgeon: Ditty, Loura Halt, MD;  Location: [facility] OR;  Service: Neurosurgery;  Laterality: N/A;   BACK SURGERY     BLADDER SUSPENSION  1991   tack   BRONCHIAL BIOPSY  09/15/2021   Procedure: BRONCHIAL BIOPSIES;  Surgeon: Josephine Igo, DO;  Location: [facility];  Service: Pulmonary;;   BRONCHIAL BIOPSY  05/04/2022   Procedure: BRONCHIAL BIOPSIES;  Surgeon: Josephine Igo, DO;  Location: [facility];  Service: Pulmonary;;   BRONCHIAL BRUSHINGS  09/15/2021   Procedure: BRONCHIAL BRUSHINGS;  Surgeon: Josephine Igo, DO;  Location: [facility];  Service: Pulmonary;;   BRONCHIAL NEEDLE ASPIRATION BIOPSY  09/15/2021   Procedure: BRONCHIAL NEEDLE ASPIRATION BIOPSIES;  Surgeon: Josephine Igo, DO;  Location: [facility];  Service: Pulmonary;;   BRONCHIAL NEEDLE ASPIRATION BIOPSY  05/04/2022   Procedure: BRONCHIAL NEEDLE ASPIRATION BIOPSIES;  Surgeon: Josephine Igo, DO;  Location: [facility];  Service: Pulmonary;;   BRONCHIAL WASHINGS  09/15/2021   Procedure: BRONCHIAL WASHINGS;  Surgeon: Josephine Igo, DO;  Location: [facility];  Service: Pulmonary;;   FIDUCIAL MARKER PLACEMENT  09/15/2021   Procedure: FIDUCIAL MARKER PLACEMENT;  Surgeon: Josephine Igo, DO;  Location: [facility];  Service: Pulmonary;;   INGUINAL HERNIA REPAIR Right 1991   INTERCOSTAL NERVE BLOCK Left 06/16/2022   Procedure: INTERCOSTAL NERVE BLOCK;  Surgeon: Corliss Skains, MD;  Location: [facility] OR;  Service: Thoracic;  Laterality: Left;   LUMBAR FUSION  2016; 2017; 07/26/2017   L4-5; L2-3; L5-S1   LYMPH NODE DISSECTION Left 06/16/2022   Procedure: LYMPH NODE DISSECTION;  Surgeon: Corliss Skains, MD;  Location: [facility] OR;  Service: Thoracic;  Laterality: Left;   POSTERIOR LUMBAR FUSION 4 WITH HARDWARE REMOVAL N/A 09/02/2017   Procedure: Lumbar three-four redo laminectomy; Repositioning of Left Sacral two screw; Extension of lumbar fusion to Thoracic twelve;  Surgeon: Ditty, Loura Halt, MD;  Location: [facility] OR;  Service: Neurosurgery;  Laterality: N/A;   TONSILLECTOMY AND ADENOIDECTOMY  1959   TUMOR EXCISION     WERTHIN'S TUMORS BOTH SIDES OF NECK   VAGINAL HYSTERECTOMY  1979   VIDEO BRONCHOSCOPY WITH RADIAL ENDOBRONCHIAL ULTRASOUND  09/15/2021   Procedure: VIDEO BRONCHOSCOPY WITH RADIAL ENDOBRONCHIAL ULTRASOUND;  Surgeon: Josephine Igo, DO;  Location: [facility];  Service: Pulmonary;;   VIDEO BRONCHOSCOPY WITH RADIAL ENDOBRONCHIAL ULTRASOUND  05/04/2022   Procedure: VIDEO BRONCHOSCOPY WITH RADIAL ENDOBRONCHIAL ULTRASOUND;  Surgeon: Josephine Igo, DO;  Location: [facility];  Service: Pulmonary;;    Family History  Problem Relation Age of Onset   Diabetes Mother    Dementia Mother    Cirrhosis Mother        Non alcoholic   COPD Father    Diabetes Brother     Allergies  Allergen Reactions   Shellfish Allergy Anaphylaxis, Swelling and Other (See Comments)    Tongue swells   Oxycodone Nausea Only   Penicillins Rash and Other (See Comments)    Has patient had a PCN reaction causing immediate rash, facial/tongue/throat  swelling, SOB or lightheadedness with hypotension: Yes Has patient had a PCN reaction causing severe rash involving mucus membranes or skin necrosis: No Has patient had a PCN reaction that required hospitalization No Has patient had a PCN reaction occurring within  the last 10 years: No If all of the above answers are "NO", then may proceed with Cephalosporin use.    Current Outpatient Medications on File Prior to Visit  Medication Sig Dispense Refill   acetaminophen (TYLENOL) 500 MG tablet Take 500 mg by mouth every 8 (eight) hours as needed (for pain).     amLODipine (NORVASC) 10 MG tablet Take 1 tablet (10 mg total) by mouth daily. For blood pressure 90 tablet 3   atorvastatin (LIPITOR) 40 MG tablet TAKE 1 TABLET(40 MG) BY MOUTH DAILY FOR CHOLESTEROL 90 tablet 2   Biotin 5000 MCG CAPS Take 5,000 mcg by mouth every morning.      cetirizine (ZYRTEC) 10 MG tablet Take 1 tablet (10 mg total) by mouth daily. For allergies 90 tablet 0   Cholecalciferol (VITAMIN D) 50 MCG (2000 UT) tablet Take 2,000 Units by mouth daily.     Coenzyme Q10 (COQ10) 100 MG CAPS Take 100 mg by mouth every evening.     esomeprazole (NEXIUM 24HR) 20 MG capsule Take 1 capsule (20 mg total) by mouth daily at 12 noon. 90 capsule 1   fluocinonide cream (LIDEX) 0.05 % Apply 1 Application topically 2 (two) times daily as needed (irritation).     folic acid (FOLVITE) 1 MG tablet TAKE 1 TABLET(1 MG) BY MOUTH DAILY 30 tablet 4   gabapentin (NEURONTIN) 300 MG capsule TAKE 1 CAPSULE(300 MG) BY MOUTH TWICE DAILY FOR PAIN 180 capsule 2   glipiZIDE (GLUCOTROL) 5 MG tablet TAKE 1 TABLET(5 MG) BY MOUTH TWICE DAILY BEFORE A MEAL FOR DIABETES 180 tablet 0   lisinopril (ZESTRIL) 20 MG tablet TAKE 1 TABLET(20 MG) BY MOUTH DAILY FOR BLOOD PRESSURE 90 tablet 3   metFORMIN (GLUCOPHAGE) 1000 MG tablet Take 0.5 tablets (500 mg total) by mouth 2 (two) times daily with a meal. for diabetes. 90 tablet 0   nystatin-triamcinolone ointment (MYCOLOG)  Apply 1 Application topically 2 (two) times daily as needed (irritation).     oxybutynin (DITROPAN-XL) 5 MG 24 hr tablet TAKE 1 TABLET(5 MG) BY MOUTH AT BEDTIME FOR OVERACTIVE BLADDER 90 tablet 2   oxybutynin (OXYTROL) 3.9 MG/24HR Place 1 patch onto the skin See admin instructions. Change every 5 days     Polyethyl Glycol-Propyl Glycol (LUBRICANT EYE DROPS) 0.4-0.3 % SOLN Place 1-2 drops into both eyes 2 (two) times daily.     prochlorperazine (COMPAZINE) 10 MG tablet TAKE 1 TABLET(10 MG) BY MOUTH EVERY 6 HOURS AS NEEDED FOR NAUSEA OR VOMITING 30 tablet 0   senna-docusate (SENOKOT-S) 8.6-50 MG tablet Take 2 tablets by mouth 2 (two) times daily. (Patient taking differently: Take 1 tablet by mouth every other day.)     dexamethasone (DECADRON) 4 MG tablet 4 mg p.o. twice daily the day before, day of and day after chemotherapy every 3 weeks (Patient not taking: Reported on 12/29/2022) 30 tablet 0   No current facility-administered medications on file prior to visit.    BP 136/72   Pulse 75   Temp 98.1 F (36.7 C) (Temporal)   Ht 5\' 8"  (1.727 m)   Wt 163 lb (73.9 kg)   SpO2 99%   BMI 24.78 kg/m  Objective:   Physical Exam Cardiovascular:     Rate and Rhythm: Normal rate and regular rhythm.  Pulmonary:     Effort: Pulmonary effort is normal.     Breath sounds: Normal breath sounds.  Musculoskeletal:     Cervical back: Neck supple.  Skin:    General:  Skin is warm and dry.           Assessment & Plan:  Type 2 diabetes mellitus with hyperglycemia, without long-term current use of insulin Assessment & Plan: Repeat A1C pending.  Continue glipizide 5 mg BID and metformin 1000 mg BID.   Follow up in 6 months.  Orders: -     Hemoglobin A1c  Dysuria Assessment & Plan: Symptoms suggestive of cystitis.  UA today with 3+ leuks, positive nitrites. Culture pending.  Reviewed prior urine culture from October 2023 which shows multiple resistance except to Macrobid.  Start  Macrobid (nitrofurantoin) tablets for urinary tract infection. Take 1 tablet by mouth twice daily for 5 days.   Orders: -     POCT Urinalysis Dipstick (Automated) -     Urine Culture -     Nitrofurantoin Monohyd Macro; Take 1 capsule (100 mg total) by mouth 2 (two) times daily for 5 days.  Dispense: 10 capsule; Refill: 0  Hyperlipidemia, unspecified hyperlipidemia type Assessment & Plan: Due for repeat lipids. Repeat lipid panel pending today.  Continue atorvastatin 40 mg daily.  Orders: -     Lipid panel  Muscle spasms of both lower extremities -     Baclofen; Take 1 tablet (10 mg total) by mouth 2 (two) times daily. For muscle spasms.  Dispense: 180 each; Refill: 1        Doreene Nest, NP

## 2022-12-29 NOTE — Assessment & Plan Note (Signed)
Due for repeat lipids. Repeat lipid panel pending today.  Continue atorvastatin 40 mg daily.

## 2022-12-29 NOTE — Patient Instructions (Signed)
Stop by the lab prior to leaving today. I will notify you of your results once received.   Start Macrobid (nitrofurantoin) tablets for urinary tract infection. Take 1 tablet by mouth twice daily for 5 days.  Please schedule a physical to meet with me in 6 months.   It was a pleasure to see you today!

## 2022-12-29 NOTE — Assessment & Plan Note (Addendum)
Symptoms suggestive of cystitis.  UA today with 3+ leuks, positive nitrites. Culture pending.  Reviewed prior urine culture from October 2023 which shows multiple resistance except to Macrobid.  Start Macrobid (nitrofurantoin) tablets for urinary tract infection. Take 1 tablet by mouth twice daily for 5 days.

## 2022-12-30 LAB — URINE CULTURE
MICRO NUMBER:: 14837358
SPECIMEN QUALITY:: ADEQUATE

## 2022-12-31 ENCOUNTER — Other Ambulatory Visit: Payer: Self-pay

## 2022-12-31 DIAGNOSIS — H25811 Combined forms of age-related cataract, right eye: Secondary | ICD-10-CM | POA: Diagnosis not present

## 2022-12-31 DIAGNOSIS — H2511 Age-related nuclear cataract, right eye: Secondary | ICD-10-CM | POA: Diagnosis not present

## 2023-01-04 ENCOUNTER — Ambulatory Visit (INDEPENDENT_AMBULATORY_CARE_PROVIDER_SITE_OTHER): Payer: PPO

## 2023-01-04 VITALS — Ht 68.0 in | Wt 162.0 lb

## 2023-01-04 DIAGNOSIS — Z0000 Encounter for general adult medical examination without abnormal findings: Secondary | ICD-10-CM

## 2023-01-04 NOTE — Progress Notes (Signed)
I connected with  Megan [name] on 01/04/23 by a audio enabled telemedicine application and verified that I am speaking with the correct person using two identifiers.  Patient Location: Home  Provider Location: Home Office  I discussed the limitations of evaluation and management by telemedicine. The patient expressed understanding and agreed to proceed.  Subjective:   Megan [name] is a 72 y.o. female who presents for Medicare Annual (Subsequent) preventive examination.  Review of Systems      Cardiac Risk Factors include: advanced age (>61men, >41 women);hypertension;sedentary lifestyle;diabetes mellitus     Objective:    Today's Vitals   01/04/23 1102  Weight: 162 lb (73.5 kg)  Height: 5\' 8"  (1.727 m)   Body mass index is 24.63 kg/m.     01/04/2023   11:17 AM 09/20/2022   10:54 AM 08/14/2022    1:37 PM 07/20/2022    3:10 PM 06/16/2022    3:00 PM 06/16/2022    6:51 AM 06/14/2022    3:28 PM  Advanced Directives  Does Patient Have a Medical Advance Directive? No No No No No No No  Would patient like information on creating a medical advance directive? No - Patient declined No - Patient declined  No - Patient declined No - Patient declined No - Patient declined No - Patient declined    Current Medications (verified) Outpatient Encounter Medications as of 01/04/2023  Medication Sig   acetaminophen (TYLENOL) 500 MG tablet Take 500 mg by mouth every 8 (eight) hours as needed (for pain).   amLODipine (NORVASC) 10 MG tablet Take 1 tablet (10 mg total) by mouth daily. For blood pressure   atorvastatin (LIPITOR) 40 MG tablet TAKE 1 TABLET(40 MG) BY MOUTH DAILY FOR CHOLESTEROL   baclofen (LIORESAL) 10 MG tablet Take 1 tablet (10 mg total) by mouth 2 (two) times daily. For muscle spasms.   Biotin 5000 MCG CAPS Take 5,000 mcg by mouth every morning.    cetirizine (ZYRTEC) 10 MG tablet Take 1 tablet (10 mg total) by mouth daily. For allergies   Cholecalciferol (VITAMIN D) 50 MCG  (2000 UT) tablet Take 2,000 Units by mouth daily.   Coenzyme Q10 (COQ10) 100 MG CAPS Take 100 mg by mouth every evening.   esomeprazole (NEXIUM 24HR) 20 MG capsule Take 1 capsule (20 mg total) by mouth daily at 12 noon.   fluocinonide cream (LIDEX) 0.05 % Apply 1 Application topically 2 (two) times daily as needed (irritation).   folic acid (FOLVITE) 1 MG tablet TAKE 1 TABLET(1 MG) BY MOUTH DAILY   gabapentin (NEURONTIN) 300 MG capsule TAKE 1 CAPSULE(300 MG) BY MOUTH TWICE DAILY FOR PAIN   glipiZIDE (GLUCOTROL) 5 MG tablet TAKE 1 TABLET(5 MG) BY MOUTH TWICE DAILY BEFORE A MEAL FOR DIABETES   lisinopril (ZESTRIL) 20 MG tablet TAKE 1 TABLET(20 MG) BY MOUTH DAILY FOR BLOOD PRESSURE   metFORMIN (GLUCOPHAGE) 1000 MG tablet Take 0.5 tablets (500 mg total) by mouth 2 (two) times daily with a meal. for diabetes.   nystatin-triamcinolone ointment (MYCOLOG) Apply 1 Application topically 2 (two) times daily as needed (irritation).   oxybutynin (DITROPAN-XL) 5 MG 24 hr tablet TAKE 1 TABLET(5 MG) BY MOUTH AT BEDTIME FOR OVERACTIVE BLADDER   oxybutynin (OXYTROL) 3.9 MG/24HR Place 1 patch onto the skin See admin instructions. Change every 5 days   Polyethyl Glycol-Propyl Glycol (LUBRICANT EYE DROPS) 0.4-0.3 % SOLN Place 1-2 drops into both eyes 2 (two) times daily.   senna-docusate (SENOKOT-S) 8.6-50 MG tablet Take 2  tablets by mouth 2 (two) times daily. (Patient taking differently: Take 1 tablet by mouth every other day.)   dexamethasone (DECADRON) 4 MG tablet 4 mg p.o. twice daily the day before, day of and day after chemotherapy every 3 weeks (Patient not taking: Reported on 12/29/2022)   prochlorperazine (COMPAZINE) 10 MG tablet TAKE 1 TABLET(10 MG) BY MOUTH EVERY 6 HOURS AS NEEDED FOR NAUSEA OR VOMITING (Patient not taking: Reported on 01/04/2023)   No facility-administered encounter medications on file as of 01/04/2023.    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   Constipation due to pain medication    Debility    Depression    Dyspnea    W/ PHYS CONDITION    Emphysema of lung    Foul smelling urine 07/22/2020   GERD (gastroesophageal reflux disease)    History of bronchitis    "not since I quit smoking" (07/26/2017)   History of kidney stones    History of shingles    Hyperlipidemia    takes Fish Oil daily   Hypertension    Hypoalbuminemia due to protein-calorie malnutrition    L3 vertebral fracture 09/01/2017   Lung cancer 2023   Lung nodule 04/21/2022   Lung nodule seen on imaging study 08/14/2021   Added automatically from request for surgery [id number]   Myelopathy 12/21/2016   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   Neuromuscular disorder    tingling toes   Paraparesis 12/15/2016   Paraparesis of both lower limbs 12/15/2016   Pneumonia 2009   PONV (postoperative nausea and vomiting)    Radiculopathy 07/28/2017   Restless leg    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 Waverly Ferrari for approach;  Surgeon: Ditty, Loura Halt, MD;  Location: [facility] OR;  Service: Neurosurgery;  Laterality: N/A;   APPLICATION OF ROBOTIC  ASSISTANCE FOR SPINAL PROCEDURE  12/19/2016   Procedure: APPLICATION OF ROBOTIC ASSISTANCE FOR SPINAL PROCEDURE;  Surgeon: Loura Halt Ditty, MD;  Location: [facility] OR;  Service: Neurosurgery;;   APPLICATION OF ROBOTIC ASSISTANCE FOR SPINAL PROCEDURE N/A 07/26/2017   Procedure: APPLICATION OF ROBOTIC ASSISTANCE FOR SPINAL PROCEDURE;  Surgeon: Ditty, Loura Halt, MD;  Location: [facility] OR;  Service: Neurosurgery;  Laterality: N/A;   APPLICATION OF ROBOTIC ASSISTANCE FOR SPINAL PROCEDURE N/A 09/02/2017   Procedure: APPLICATION OF ROBOTIC ASSISTANCE FOR SPINAL PROCEDURE;  Surgeon: Ditty, Loura Halt, MD;  Location: [facility] OR;  Service: Neurosurgery;  Laterality: N/A;   BACK SURGERY     BLADDER SUSPENSION  1991   tack   BRONCHIAL BIOPSY  09/15/2021   Procedure: BRONCHIAL BIOPSIES;  Surgeon: Josephine Igo, DO;  Location: [facility];  Service: Pulmonary;;  BRONCHIAL BIOPSY  05/04/2022   Procedure: BRONCHIAL BIOPSIES;  Surgeon: Josephine Igo, DO;  Location: [facility];  Service: Pulmonary;;   BRONCHIAL BRUSHINGS  09/15/2021   Procedure: BRONCHIAL BRUSHINGS;  Surgeon: Josephine Igo, DO;  Location: [facility];  Service: Pulmonary;;   BRONCHIAL NEEDLE ASPIRATION BIOPSY  09/15/2021   Procedure: BRONCHIAL NEEDLE ASPIRATION BIOPSIES;  Surgeon: Josephine Igo, DO;  Location: [facility];  Service: Pulmonary;;   BRONCHIAL NEEDLE ASPIRATION BIOPSY  05/04/2022   Procedure: BRONCHIAL NEEDLE ASPIRATION BIOPSIES;  Surgeon: Josephine Igo, DO;  Location: [facility];  Service: Pulmonary;;   BRONCHIAL WASHINGS  09/15/2021   Procedure: BRONCHIAL WASHINGS;  Surgeon: Josephine Igo, DO;  Location: [facility];  Service: Pulmonary;;   FIDUCIAL MARKER PLACEMENT  09/15/2021   Procedure: FIDUCIAL MARKER PLACEMENT;  Surgeon: Josephine Igo, DO;  Location: [facility];  Service: Pulmonary;;   INGUINAL HERNIA REPAIR Right 1991   INTERCOSTAL NERVE BLOCK Left 06/16/2022   Procedure: INTERCOSTAL NERVE BLOCK;  Surgeon:  Corliss Skains, MD;  Location: [facility] OR;  Service: Thoracic;  Laterality: Left;   LUMBAR FUSION  2016; 2017; 07/26/2017   L4-5; L2-3; L5-S1   LYMPH NODE DISSECTION Left 06/16/2022   Procedure: LYMPH NODE DISSECTION;  Surgeon: Corliss Skains, MD;  Location: [facility] OR;  Service: Thoracic;  Laterality: Left;   POSTERIOR LUMBAR FUSION 4 WITH HARDWARE REMOVAL N/A 09/02/2017   Procedure: Lumbar three-four redo laminectomy; Repositioning of Left Sacral two screw; Extension of lumbar fusion to Thoracic twelve;  Surgeon: Ditty, Loura Halt, MD;  Location: [facility] OR;  Service: Neurosurgery;  Laterality: N/A;   TONSILLECTOMY AND ADENOIDECTOMY  1959   TUMOR EXCISION     WERTHIN'S TUMORS BOTH SIDES OF NECK   VAGINAL HYSTERECTOMY  1979   VIDEO BRONCHOSCOPY WITH RADIAL ENDOBRONCHIAL ULTRASOUND  09/15/2021   Procedure: VIDEO BRONCHOSCOPY WITH RADIAL ENDOBRONCHIAL ULTRASOUND;  Surgeon: Josephine Igo, DO;  Location: [facility];  Service: Pulmonary;;   VIDEO BRONCHOSCOPY WITH RADIAL ENDOBRONCHIAL ULTRASOUND  05/04/2022   Procedure: VIDEO BRONCHOSCOPY WITH RADIAL ENDOBRONCHIAL ULTRASOUND;  Surgeon: Josephine Igo, DO;  Location: [facility];  Service: Pulmonary;;   Family History  Problem Relation Age of Onset   Diabetes Mother    Dementia Mother    Cirrhosis Mother        Non alcoholic   COPD Father    Diabetes Brother    Social History   Socioeconomic History   Marital status: Married    Spouse name: Marita [place]   Number of children: 1   Years of education: 12   Highest education level: 12th grade  Occupational History   Not on file  Tobacco Use   Smoking status: Former    Packs/day: 1.50    Years: 35.00    Additional pack years: 0.00    Total pack years: 52.50    Types: Cigarettes    Quit date: 05/13/2006    Years since quitting: 16.6   Smokeless tobacco: Never  Vaping Use   Vaping Use: Never used  Substance and Sexual Activity   Alcohol use: Yes    Alcohol/week: 2.0 standard drinks  of alcohol    Types: 2 Glasses of wine per week   Drug use: No   Sexual activity: Not Currently    Birth control/protection: Surgical  Other Topics Concern   Not on file  Social History Narrative   Lives w/ husband   Married.   1 child, 1 grandchildren.   Retired. Once  worked for [employer].   Enjoys reading.    Social Determinants of Health   Financial Resource Strain: Low Risk  (01/04/2023)   Overall Financial Resource Strain (CARDIA)    Difficulty of Paying Living Expenses: Not hard at all  Food Insecurity: No Food Insecurity (01/04/2023)   Hunger Vital Sign    Worried About Running Out of Food in the Last Year: Never true    Ran Out of Food in the Last Year: Never true  Transportation Needs: No Transportation Needs (01/04/2023)   PRAPARE - Administrator, Civil Service (Medical): No    Lack of Transportation (Non-Medical): No  Physical Activity: Insufficiently Active (01/04/2023)   Exercise Vital Sign    Days of Exercise per Week: 7 days    Minutes of Exercise per Session: 10 min  Stress: No Stress Concern Present (01/04/2023)   [employer] of Occupational Health - Occupational Stress Questionnaire    Feeling of Stress : Not at all  Recent Concern: Stress - Stress Concern Present (12/27/2022)   [employer] of Occupational Health - Occupational Stress Questionnaire    Feeling of Stress : To some extent  Social Connections: Moderately Isolated (01/04/2023)   Social Connection and Isolation Panel [NHANES]    Frequency of Communication with Friends and Family: More than three times a week    Frequency of Social Gatherings with Friends and Family: Once a week    Attends Religious Services: Never    Database administrator or Organizations: No    Attends Engineer, structural: Never    Marital Status: Married    Tobacco Counseling Counseling given: Not Answered   Clinical Intake:  Pre-visit preparation completed: Yes  Pain : No/denies  pain     Nutritional Risks: None Diabetes: Yes CBG done?: Yes (112 per pt) CBG resulted in Enter/ Edit results?: No Did pt. bring in CBG monitor from home?: No  How often do you need to have someone help you when you read instructions, pamphlets, or other written materials from your doctor or pharmacy?: 1 - Never  Diabetic? Nutrition Risk Assessment:  Has the patient had any N/V/D within the last 2 months?  No  Does the patient have any non-healing wounds?  No  Has the patient had any unintentional weight loss or weight gain?  No   Diabetes:  Is the patient diabetic?  Yes  If diabetic, was a CBG obtained today?  Yes , 112 per pt Did the patient bring in their glucometer from home?  No  How often do you monitor your CBG's? QD.   Financial Strains and Diabetes Management:  Are you having any financial strains with the device, your supplies or your medication? No .  Does the patient want to be seen by Chronic Care Management for management of their diabetes?  No  Would the patient like to be referred to a Nutritionist or for Diabetic Management?  No   Diabetic Exams:  Diabetic Eye Exam: Completed 12/31/22 Dr.Groat, Dr.Wood [facility] Diabetic Foot Exam: Completed 01/29/22 PCP    Interpreter Needed?: No  Information entered by :: C.Eames Dibiasio LPN   Activities of Daily Living    01/04/2023   11:18 AM 01/01/2023   10:59 AM  In your present state of health, do you have any difficulty performing the following activities:  Hearing? 0 0  Vision? 0 0  Difficulty concentrating or making decisions? 1 0  Comment occasionally forgets   Walking or climbing stairs?  0 1  Dressing or bathing? 0 0  Doing errands, shopping? 1 1  Comment Family assists   Preparing Food and eating ? N N  Using the Toilet? N N  In the past six months, have you accidently leaked urine? Y Y  Comment Occasionally if can't get to bathroom quick enough.   Do you have problems with loss of bowel control? Y  N  Comment Started after bowel control   Managing your Medications? N N  Managing your Finances? N N  Housekeeping or managing your Housekeeping? N Y    Patient Care Team: Doreene Nest, NP as PCP - General (Internal Medicine) Barnett Abu, MD as Consulting Physician (Neurosurgery)  Indicate any recent Medical Services you may have received from other than [facility] providers in the past year (date may be approximate).     Assessment:   This is a routine wellness examination for Satrina.  Hearing/Vision screen Hearing Screening - Comments:: No aid Vision Screening - Comments:: Glasses - Dr.Groat, [facility]  Dietary issues and exercise activities discussed: Current Exercise Habits: The patient does not participate in regular exercise at present, Exercise limited by: orthopedic condition(s) (Back issue)   Goals Addressed             This Visit's Progress    Patient Stated       No new goal.       Depression Screen    01/04/2023   11:17 AM 12/29/2022    9:23 AM 10/11/2022    9:37 AM 01/01/2022    1:11 PM 01/01/2022    1:06 PM 07/31/2021    3:02 PM 12/31/2020    1:19 PM  PHQ 2/9 Scores  PHQ - 2 Score 0 2 0 0 0 0 2  PHQ- 9 Score 0 7    4 2     Fall Risk    01/04/2023   11:18 AM 01/01/2023   10:59 AM 12/29/2022    9:23 AM 01/01/2022    1:08 PM 07/31/2021    3:04 PM  Fall Risk   Falls in the past year? 0 0 0 0 0  Number falls in past yr: 0  0 0 0  Injury with Fall? 0  0 0 0  Risk for fall due to : No Fall Risks  No Fall Risks Impaired balance/gait Impaired balance/gait;Impaired mobility  Follow up Falls prevention discussed;Falls evaluation completed  Falls evaluation completed Falls evaluation completed;Education provided     FALL RISK PREVENTION PERTAINING TO THE HOME:  Any stairs in or around the home? No  If so, are there any without handrails? No  Home free of loose throw rugs in walkways, pet beds, electrical cords, etc? Yes  Adequate lighting in your  home to reduce risk of falls? Yes   ASSISTIVE DEVICES UTILIZED TO PREVENT FALLS:  Life alert? No  Use of a cane, walker or w/c? Yes  Grab bars in the bathroom? Yes  Shower chair or bench in shower? Yes  Elevated toilet seat or a handicapped toilet? Yes    Cognitive Function:    12/31/2020    1:25 PM 12/27/2019   11:22 AM  MMSE - Mini Mental State Exam  Orientation to time 5 5  Orientation to Place 5 5  Registration 3 3  Attention/ Calculation 5 5  Recall 3 3  Language- repeat 1 1        01/04/2023   11:20 AM  6CIT Screen  What Year? 0 points  What month? 0 points  What time? 0 points  Count back from 20 0 points  Months in reverse 0 points  Repeat phrase 0 points  Total Score 0 points    Immunizations Immunization History  Administered Date(s) Administered   Fluad Quad(high Dose 65+) 07/20/2019, 07/22/2020, 07/31/2021, 07/01/2022   Influenza, High Dose Seasonal PF 06/30/2018   Influenza,inj,Quad PF,6+ Mos 07/01/2017   Influenza-Unspecified 09/27/2016   PFIZER(Purple Top)SARS-COV-2 Vaccination 11/30/2019, 12/28/2019   PNEUMOCOCCAL CONJUGATE-20 08/03/2021   Pneumococcal Polysaccharide-23 04/07/2015, 07/22/2020   Tdap 04/07/2015   Zoster Recombinat (Shingrix) 11/06/2019, 01/17/2020    TDAP status: Up to date  Flu Vaccine status: Up to date  Pneumococcal vaccine status: Up to date  Covid-19 vaccine status: Information provided on how to obtain vaccines.   Qualifies for Shingles Vaccine? Yes   Zostavax completed No   Shingrix Completed?: Yes  Screening Tests Health Maintenance  Topic Date Due   COVID-19 Vaccine (3 - Pfizer risk series) 01/14/2023 (Originally 01/25/2020)   COLONOSCOPY (Pts 45-70yrs Insurance coverage will need to be confirmed)  12/27/2023 (Originally 04/26/1998)   FOOT EXAM  01/30/2023   INFLUENZA VACCINE  04/14/2023   Diabetic kidney evaluation - Urine ACR  06/30/2023   HEMOGLOBIN A1C  06/30/2023   OPHTHALMOLOGY EXAM  11/02/2023    Diabetic kidney evaluation - eGFR measurement  11/03/2023   Medicare Annual Wellness (AWV)  01/04/2024   MAMMOGRAM  01/09/2024   DTaP/Tdap/Td (2 - Td or Tdap) 04/06/2025   Pneumonia Vaccine 70+ Years old  Completed   DEXA SCAN  Completed   Hepatitis C Screening  Completed   Zoster Vaccines- Shingrix  Completed   HPV VACCINES  Aged Out    Health Maintenance  There are no preventive care reminders to display for this patient.   Colorectal cancer screening: Type of screening: Cologuard. Completed 08/20/21. Repeat every 3 years  Mammogram status: Completed 01/08/22. Repeat every year scheduled for 01/18/23.  Bone Density status: Completed 01/02/20. Results reflect: Bone density results: OSTEOPENIA. Repeat every 2 years. Pt declined.  Lung Cancer Screening: (Low Dose CT Chest recommended if Age 62-80 years, 30 pack-year currently smoking OR have quit w/in 15years.) does not qualify.   Lung Cancer Screening Referral: no  Additional Screening:  Hepatitis C Screening: does qualify; Completed 01/31/17  Vision Screening: Recommended annual ophthalmology exams for early detection of glaucoma and other disorders of the eye. Is the patient up to date with their annual eye exam?  Yes  Who is the provider or what is the name of the office in which the patient attends annual eye exams? Dr.Groat, [facility] If pt is not established with a provider, would they like to be referred to a provider to establish care? No .   Dental Screening: Recommended annual dental exams for proper oral hygiene  Community Resource Referral / Chronic Care Management: CRR required this visit?  No   CCM required this visit?  No      Plan:     I have personally reviewed and noted the following in the patient's chart:   Medical and social history Use of alcohol, tobacco or illicit drugs  Current medications and supplements including opioid prescriptions. Patient is not currently taking opioid  prescriptions. Functional ability and status Nutritional status Physical activity Advanced directives List of other physicians Hospitalizations, surgeries, and ER visits in previous 12 months Vitals Screenings to include cognitive, depression, and falls Referrals and appointments  In addition, I have reviewed and discussed with patient certain  preventive protocols, quality metrics, and best practice recommendations. A written personalized care plan for preventive services as well as general preventive health recommendations were provided to patient.     Maryan Puls, LPN   [id number]   Nurse Notes: None

## 2023-01-04 NOTE — Patient Instructions (Signed)
Megan [name] , Thank you for taking time to come for your Medicare Wellness Visit. I appreciate your ongoing commitment to your health goals. Please review the following plan we discussed and let me know if I can assist you in the future.   These are the goals we discussed:  Goals      Patient Stated     12/27/2019, I will maintain and continue medications as prescribed.      Patient Stated     12/31/2020, I will maintain and continue medications as prescribed.      Patient Stated     No new goal.        This is a list of the screening recommended for you and due dates:  Health Maintenance  Topic Date Due   COVID-19 Vaccine (3 - Pfizer risk series) 01/14/2023*   Colon Cancer Screening  12/27/2023*   Complete foot exam   01/30/2023   Flu Shot  04/14/2023   Yearly kidney health urinalysis for diabetes  06/30/2023   Hemoglobin A1C  06/30/2023   Eye exam for diabetics  11/02/2023   Yearly kidney function blood test for diabetes  11/03/2023   Medicare Annual Wellness Visit  01/04/2024   Mammogram  01/09/2024   DTaP/Tdap/Td vaccine (2 - Td or Tdap) 04/06/2025   Pneumonia Vaccine  Completed   DEXA scan (bone density measurement)  Completed   Hepatitis C Screening: USPSTF Recommendation to screen - Ages 70-79 yo.  Completed   Zoster (Shingles) Vaccine  Completed   HPV Vaccine  Aged Out  *Topic was postponed. The date shown is not the original due date.    Advanced directives: none  Conditions/risks identified: Aim for 30 minutes of exercise or brisk walking, 6-8 glasses of water, and 5 servings of fruits and vegetables each day.   Next appointment: Follow up in one year for your annual wellness visit 01/05/24 @ 2:30 televisit   Preventive Care 65 Years and Older, Female Preventive care refers to lifestyle choices and visits with your health care provider that can promote health and wellness. What does preventive care include? A yearly physical exam. This is also called an  annual well check. Dental exams once or twice a year. Routine eye exams. Ask your health care provider how often you should have your eyes checked. Personal lifestyle choices, including: Daily care of your teeth and gums. Regular physical activity. Eating a healthy diet. Avoiding tobacco and drug use. Limiting alcohol use. Practicing safe sex. Taking low-dose aspirin every day. Taking vitamin and mineral supplements as recommended by your health care provider. What happens during an annual well check? The services and screenings done by your health care provider during your annual well check will depend on your age, overall health, lifestyle risk factors, and family history of disease. Counseling  Your health care provider may ask you questions about your: Alcohol use. Tobacco use. Drug use. Emotional well-being. Home and relationship well-being. Sexual activity. Eating habits. History of falls. Memory and ability to understand (cognition). Work and work Astronomer. Reproductive health. Screening  You may have the following tests or measurements: Height, weight, and BMI. Blood pressure. Lipid and cholesterol levels. These may be checked every 5 years, or more frequently if you are over 54 years old. Skin check. Lung cancer screening. You may have this screening every year starting at age 70 if you have a 30-pack-year history of smoking and currently smoke or have quit within the past 15 years. Fecal occult blood test (  FOBT) of the stool. You may have this test every year starting at age 70. Flexible sigmoidoscopy or colonoscopy. You may have a sigmoidoscopy every 5 years or a colonoscopy every 10 years starting at age 46. Hepatitis C blood test. Hepatitis B blood test. Sexually transmitted disease (STD) testing. Diabetes screening. This is done by checking your blood sugar (glucose) after you have not eaten for a while (fasting). You may have this done every 1-3 years. Bone  density scan. This is done to screen for osteoporosis. You may have this done starting at age 70. Mammogram. This may be done every 1-2 years. Talk to your health care provider about how often you should have regular mammograms. Talk with your health care provider about your test results, treatment options, and if necessary, the need for more tests. Vaccines  Your health care provider may recommend certain vaccines, such as: Influenza vaccine. This is recommended every year. Tetanus, diphtheria, and acellular pertussis (Tdap, Td) vaccine. You may need a Td booster every 10 years. Zoster vaccine. You may need this after age 64. Pneumococcal 13-valent conjugate (PCV13) vaccine. One dose is recommended after age 70. Pneumococcal polysaccharide (PPSV23) vaccine. One dose is recommended after age 65. Talk to your health care provider about which screenings and vaccines you need and how often you need them. This information is not intended to replace advice given to you by your health care provider. Make sure you discuss any questions you have with your health care provider. Document Released: 09/26/2015 Document Revised: 05/19/2016 Document Reviewed: 07/01/2015 Elsevier Interactive Patient Education  2017 [employer].  Fall Prevention in the Home Falls can cause injuries. They can happen to people of all ages. There are many things you can do to make your home safe and to help prevent falls. What can I do on the outside of my home? Regularly fix the edges of walkways and driveways and fix any cracks. Remove anything that might make you trip as you walk through a door, such as a raised step or threshold. Trim any bushes or trees on the path to your home. Use bright outdoor lighting. Clear any walking paths of anything that might make someone trip, such as rocks or tools. Regularly check to see if handrails are loose or broken. Make sure that both sides of any steps have handrails. Any raised  decks and porches should have guardrails on the edges. Have any leaves, snow, or ice cleared regularly. Use sand or salt on walking paths during winter. Clean up any spills in your garage right away. This includes oil or grease spills. What can I do in the bathroom? Use night lights. Install grab bars by the toilet and in the tub and shower. Do not use towel bars as grab bars. Use non-skid mats or decals in the tub or shower. If you need to sit down in the shower, use a plastic, non-slip stool. Keep the floor dry. Clean up any water that spills on the floor as soon as it happens. Remove soap buildup in the tub or shower regularly. Attach bath mats securely with double-sided non-slip rug tape. Do not have throw rugs and other things on the floor that can make you trip. What can I do in the bedroom? Use night lights. Make sure that you have a light by your bed that is easy to reach. Do not use any sheets or blankets that are too big for your bed. They should not hang down onto the floor. Have a  firm chair that has side arms. You can use this for support while you get dressed. Do not have throw rugs and other things on the floor that can make you trip. What can I do in the kitchen? Clean up any spills right away. Avoid walking on wet floors. Keep items that you use a lot in easy-to-reach places. If you need to reach something above you, use a strong step stool that has a grab bar. Keep electrical cords out of the way. Do not use floor polish or wax that makes floors slippery. If you must use wax, use non-skid floor wax. Do not have throw rugs and other things on the floor that can make you trip. What can I do with my stairs? Do not leave any items on the stairs. Make sure that there are handrails on both sides of the stairs and use them. Fix handrails that are broken or loose. Make sure that handrails are as long as the stairways. Check any carpeting to make sure that it is firmly attached  to the stairs. Fix any carpet that is loose or worn. Avoid having throw rugs at the top or bottom of the stairs. If you do have throw rugs, attach them to the floor with carpet tape. Make sure that you have a light switch at the top of the stairs and the bottom of the stairs. If you do not have them, ask someone to add them for you. What else can I do to help prevent falls? Wear shoes that: Do not have high heels. Have rubber bottoms. Are comfortable and fit you well. Are closed at the toe. Do not wear sandals. If you use a stepladder: Make sure that it is fully opened. Do not climb a closed stepladder. Make sure that both sides of the stepladder are locked into place. Ask someone to hold it for you, if possible. Clearly mark and make sure that you can see: Any grab bars or handrails. First and last steps. Where the edge of each step is. Use tools that help you move around (mobility aids) if they are needed. These include: Canes. Walkers. Scooters. Crutches. Turn on the lights when you go into a dark area. Replace any light bulbs as soon as they burn out. Set up your furniture so you have a clear path. Avoid moving your furniture around. If any of your floors are uneven, fix them. If there are any pets around you, be aware of where they are. Review your medicines with your doctor. Some medicines can make you feel dizzy. This can increase your chance of falling. Ask your doctor what other things that you can do to help prevent falls. This information is not intended to replace advice given to you by your health care provider. Make sure you discuss any questions you have with your health care provider. Document Released: 06/26/2009 Document Revised: 02/05/2016 Document Reviewed: 10/04/2014 Elsevier Interactive Patient Education  2017 [employer].

## 2023-01-05 ENCOUNTER — Other Ambulatory Visit: Payer: Self-pay

## 2023-01-14 DIAGNOSIS — H2512 Age-related nuclear cataract, left eye: Secondary | ICD-10-CM | POA: Diagnosis not present

## 2023-01-14 DIAGNOSIS — H25812 Combined forms of age-related cataract, left eye: Secondary | ICD-10-CM | POA: Diagnosis not present

## 2023-01-18 ENCOUNTER — Ambulatory Visit
Admission: RE | Admit: 2023-01-18 | Discharge: 2023-01-18 | Disposition: A | Discharge status: Home / Self Care | Payer: PPO | Source: Ambulatory Visit | Attending: Primary Care | Admitting: Primary Care

## 2023-01-18 DIAGNOSIS — Z1231 Encounter for screening mammogram for malignant neoplasm of breast: Secondary | ICD-10-CM | POA: Diagnosis not present

## 2023-02-10 ENCOUNTER — Telehealth: Payer: Self-pay | Admitting: Medical Oncology

## 2023-02-10 ENCOUNTER — Other Ambulatory Visit: Payer: Self-pay | Admitting: Physician Assistant

## 2023-02-10 DIAGNOSIS — C3432 Malignant neoplasm of lower lobe, left bronchus or lung: Secondary | ICD-10-CM

## 2023-02-10 DIAGNOSIS — R251 Tremor, unspecified: Secondary | ICD-10-CM

## 2023-02-10 NOTE — Telephone Encounter (Signed)
Hands shaking-Pt asking if the provider will  order  a brain scan?

## 2023-02-11 NOTE — Telephone Encounter (Signed)
Pt notified that radiology will be calling her to schedule a MRI of the brain, expected around June 6.

## 2023-02-21 ENCOUNTER — Telehealth: Payer: Self-pay | Admitting: Primary Care

## 2023-02-21 DIAGNOSIS — E1165 Type 2 diabetes mellitus with hyperglycemia: Secondary | ICD-10-CM

## 2023-02-21 MED ORDER — METFORMIN HCL 500 MG PO TABS
500.0000 mg | ORAL_TABLET | Freq: Two times a day (BID) | ORAL | 1 refills | Status: DC
Start: 2023-02-21 — End: 2023-08-16

## 2023-02-21 NOTE — Telephone Encounter (Signed)
Prescription Request  02/21/2023  LOV: 12/29/2022  What is the name of the medication or equipment? metFORMIN (GLUCOPHAGE) 1000 MG tablet patient requested 500 mg dose instead, states this is the one she has been taking   Have you contacted your pharmacy to request a refill? No   Which pharmacy would you like this sent to?  [id number] - [place], [place] - [address] AT [facility] [address] [place] [address] Phone: [phone] Fax: [phone]  Patient notified that their request is being sent to the clinical staff for review and that they should receive a response within 2 business days.   Please advise at [facility] [phone]

## 2023-02-22 ENCOUNTER — Ambulatory Visit (HOSPITAL_COMMUNITY)
Admission: RE | Admit: 2023-02-22 | Discharge: 2023-02-22 | Disposition: A | Discharge status: Home / Self Care | Payer: PPO | Source: Ambulatory Visit | Attending: Physician Assistant | Admitting: Physician Assistant

## 2023-02-22 DIAGNOSIS — C3432 Malignant neoplasm of lower lobe, left bronchus or lung: Secondary | ICD-10-CM | POA: Diagnosis not present

## 2023-02-22 DIAGNOSIS — C799 Secondary malignant neoplasm of unspecified site: Secondary | ICD-10-CM | POA: Diagnosis not present

## 2023-02-22 DIAGNOSIS — R251 Tremor, unspecified: Secondary | ICD-10-CM | POA: Insufficient documentation

## 2023-02-22 MED ORDER — GADOBUTROL 1 MMOL/ML IV SOLN
7.5000 mL | Freq: Once | INTRAVENOUS | Status: AC | PRN
  Administered 2023-02-22: 7.5 mL via INTRAVENOUS

## 2023-02-23 ENCOUNTER — Telehealth: Payer: Self-pay | Admitting: Primary Care

## 2023-02-23 DIAGNOSIS — E1165 Type 2 diabetes mellitus with hyperglycemia: Secondary | ICD-10-CM

## 2023-02-23 MED ORDER — GLIPIZIDE 5 MG PO TABS
ORAL_TABLET | ORAL | 1 refills | Status: DC
Start: 2023-02-23 — End: 2023-08-25

## 2023-02-23 NOTE — Telephone Encounter (Signed)
Prescription Request  02/23/2023  LOV: 12/29/2022  What is the name of the medication or equipment? glipiZIDE (GLUCOTROL) 5 MG tablet  Have you contacted your pharmacy to request a refill? Yes   Which pharmacy would you like this sent to?  [id number] - [place], [place] - [address] AT [facility] OF [address] [place] [address] Phone: [phone] Fax: [phone]    Patient notified that their request is being sent to the clinical staff for review and that they should receive a response within 2 business days.   Please advise at Mobile [phone] (mobile)

## 2023-02-23 NOTE — Addendum Note (Signed)
Addended by: Doreene Nest on: 02/23/2023 03:58 PM   Modules accepted: Orders

## 2023-02-23 NOTE — Telephone Encounter (Signed)
Refill sent to pharmacy.   

## 2023-02-24 ENCOUNTER — Ambulatory Visit (HOSPITAL_COMMUNITY)
Admission: RE | Admit: 2023-02-24 | Discharge: 2023-02-24 | Disposition: A | Discharge status: Home / Self Care | Payer: PPO | Source: Ambulatory Visit | Attending: Physician Assistant | Admitting: Physician Assistant

## 2023-02-24 ENCOUNTER — Inpatient Hospital Stay: Payer: PPO | Attending: Physician Assistant

## 2023-02-24 ENCOUNTER — Other Ambulatory Visit: Payer: Self-pay

## 2023-02-24 DIAGNOSIS — C3432 Malignant neoplasm of lower lobe, left bronchus or lung: Secondary | ICD-10-CM | POA: Diagnosis not present

## 2023-02-24 DIAGNOSIS — J439 Emphysema, unspecified: Secondary | ICD-10-CM | POA: Diagnosis not present

## 2023-02-24 DIAGNOSIS — Z5111 Encounter for antineoplastic chemotherapy: Secondary | ICD-10-CM

## 2023-02-24 DIAGNOSIS — Z9221 Personal history of antineoplastic chemotherapy: Secondary | ICD-10-CM | POA: Insufficient documentation

## 2023-02-24 DIAGNOSIS — Z85118 Personal history of other malignant neoplasm of bronchus and lung: Secondary | ICD-10-CM | POA: Insufficient documentation

## 2023-02-24 DIAGNOSIS — C3490 Malignant neoplasm of unspecified part of unspecified bronchus or lung: Secondary | ICD-10-CM | POA: Diagnosis not present

## 2023-02-24 LAB — CBC WITH DIFFERENTIAL (CANCER CENTER ONLY)
Abs Immature Granulocytes: 0.01 10*3/uL (ref 0.00–0.07)
Basophils Absolute: 0 10*3/uL (ref 0.0–0.1)
Basophils Relative: 0 %
Eosinophils Absolute: 0.1 10*3/uL (ref 0.0–0.5)
Eosinophils Relative: 2 %
HCT: 33.7 % — ABNORMAL LOW (ref 36.0–46.0)
Hemoglobin: 10.9 g/dL — ABNORMAL LOW (ref 12.0–15.0)
Immature Granulocytes: 0 %
Lymphocytes Relative: 15 %
Lymphs Abs: 0.8 10*3/uL (ref 0.7–4.0)
MCH: 28.8 pg (ref 26.0–34.0)
MCHC: 32.3 g/dL (ref 30.0–36.0)
MCV: 88.9 fL (ref 80.0–100.0)
Monocytes Absolute: 0.6 10*3/uL (ref 0.1–1.0)
Monocytes Relative: 12 %
Neutro Abs: 3.4 10*3/uL (ref 1.7–7.7)
Neutrophils Relative %: 71 %
Platelet Count: 198 10*3/uL (ref 150–400)
RBC: 3.79 MIL/uL — ABNORMAL LOW (ref 3.87–5.11)
RDW: 13.5 % (ref 11.5–15.5)
WBC Count: 4.9 10*3/uL (ref 4.0–10.5)
nRBC: 0 % (ref 0.0–0.2)

## 2023-02-24 LAB — CMP (CANCER CENTER ONLY)
ALT: 18 U/L (ref 0–44)
AST: 17 U/L (ref 15–41)
Albumin: 3.8 g/dL (ref 3.5–5.0)
Alkaline Phosphatase: 105 U/L (ref 38–126)
Anion gap: 8 (ref 5–15)
BUN: 14 mg/dL (ref 8–23)
CO2: 27 mmol/L (ref 22–32)
Calcium: 9.3 mg/dL (ref 8.9–10.3)
Chloride: 105 mmol/L (ref 98–111)
Creatinine: 0.79 mg/dL (ref 0.44–1.00)
GFR, Estimated: >60 mL/min (ref >60)
Glucose, Bld: 147 mg/dL — ABNORMAL HIGH (ref 70–99)
Potassium: 4.6 mmol/L (ref 3.5–5.1)
Sodium: 140 mmol/L (ref 135–145)
Total Bilirubin: 0.4 mg/dL (ref 0.3–1.2)
Total Protein: 7.4 g/dL (ref 6.5–8.1)

## 2023-02-24 MED ORDER — IOHEXOL 300 MG/ML  SOLN
100.0000 mL | Freq: Once | INTRAMUSCULAR | Status: AC | PRN
  Administered 2023-02-24: 75 mL via INTRAVENOUS

## 2023-03-01 ENCOUNTER — Other Ambulatory Visit: Payer: Self-pay

## 2023-03-01 ENCOUNTER — Inpatient Hospital Stay (HOSPITAL_BASED_OUTPATIENT_CLINIC_OR_DEPARTMENT_OTHER): Payer: PPO | Admitting: Internal Medicine

## 2023-03-01 ENCOUNTER — Encounter: Payer: Self-pay | Admitting: Internal Medicine

## 2023-03-01 VITALS — BP 130/71 | HR 81 | Temp 98.5°F | Resp 17 | Ht 68.0 in | Wt 165.2 lb

## 2023-03-01 DIAGNOSIS — Z9221 Personal history of antineoplastic chemotherapy: Secondary | ICD-10-CM | POA: Diagnosis not present

## 2023-03-01 DIAGNOSIS — C3490 Malignant neoplasm of unspecified part of unspecified bronchus or lung: Secondary | ICD-10-CM | POA: Diagnosis not present

## 2023-03-01 DIAGNOSIS — Z85118 Personal history of other malignant neoplasm of bronchus and lung: Secondary | ICD-10-CM | POA: Diagnosis not present

## 2023-03-01 NOTE — Progress Notes (Signed)
[facility] [phone]   [phone]  OFFICE PROGRESS NOTE  Doreene Nest, NP [address] [place] [address]  DIAGNOSIS: Stage IIb (T3, N0, M0)  non-small cell lung cancer, adenocarcinoma presented with large left lower lobe lung mass diagnosed in August 2023  Molecular studies by foundation 1 showed no actionable mutation and she has negative PD-L1 expression.  PRIOR THERAPY:  1) Status post left lower lobectomy with lymph node sampling under the care of Dr. Cliffton Asters on June 16, 2022 with tumor size of 5.5 cm and visceropleural involvement. 2) Adjuvant systemic chemotherapy with carboplatin for AUC of 5 and Alimta 500 Mg/M2.  First dose August 09, 2022.  Status post 4 cycles.  CURRENT THERAPY: Observation.  INTERVAL HISTORY: Megan [name] 72 y.o. female returns to the clinic today for follow-up visit accompanied by her husband.  The patient is feeling fine today with no concerning complaints.  She denied having any recent chest pain, shortness of breath except with exertion with no cough or hemoptysis.  She has no nausea, vomiting, diarrhea or constipation.  She has no headache or visual changes.  She denied having any recent weight loss or night sweats.  She is here today for evaluation with repeat CT scan of the chest for restaging of her disease.  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   Constipation due to pain medication    Debility    Depression    Dyspnea    W/ PHYS CONDITION    Emphysema of lung (HCC)    Foul smelling urine 07/22/2020   GERD (gastroesophageal reflux disease)    History of bronchitis    "not since I quit smoking" (07/26/2017)   History of kidney stones    History of shingles    Hyperlipidemia     takes Fish Oil daily   Hypertension    Hypoalbuminemia due to protein-calorie malnutrition (HCC)    L3 vertebral fracture (HCC) 09/01/2017   Lung cancer (HCC) 2023   Lung nodule 04/21/2022   Lung nodule seen on imaging study 08/14/2021   Added automatically from request for surgery [id number]   Myelopathy (HCC) 12/21/2016   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  MEDICATIONS:  Current Outpatient Medications  Medication Sig Dispense Refill   acetaminophen (TYLENOL) 500 MG tablet Take 500 mg by mouth every 8 (eight) hours as needed (for pain).     amLODipine (NORVASC) 10 MG tablet Take 1 tablet (10 mg total) by mouth daily. For blood pressure 90 tablet 3   atorvastatin (LIPITOR) 40 MG tablet TAKE 1 TABLET(40 MG) BY MOUTH DAILY FOR CHOLESTEROL 90 tablet  2   baclofen (LIORESAL) 10 MG tablet Take 1 tablet (10 mg total) by mouth 2 (two) times daily. For muscle spasms. 180 each 1   Biotin 5000 MCG CAPS Take 5,000 mcg by mouth every morning.      cetirizine (ZYRTEC) 10 MG tablet Take 1 tablet (10 mg total) by mouth daily. For allergies 90 tablet 0   Cholecalciferol (VITAMIN D) 50 MCG (2000 UT) tablet Take 2,000 Units by mouth daily.     Coenzyme Q10 (COQ10) 100 MG CAPS Take 100 mg by mouth every evening.     dexamethasone (DECADRON) 4 MG tablet 4 mg p.o. twice daily the day before, day of and day after chemotherapy  every 3 weeks (Patient not taking: Reported on 12/29/2022) 30 tablet 0   esomeprazole (NEXIUM 24HR) 20 MG capsule Take 1 capsule (20 mg total) by mouth daily at 12 noon. 90 capsule 1   fluocinonide cream (LIDEX) 0.05 % Apply 1 Application topically 2 (two) times daily as needed (irritation).     folic acid (FOLVITE) 1 MG tablet TAKE 1 TABLET(1 MG) BY MOUTH DAILY 30 tablet 4   gabapentin (NEURONTIN) 300 MG capsule TAKE 1 CAPSULE(300 MG) BY MOUTH TWICE DAILY FOR PAIN 180 capsule 2   glipiZIDE (GLUCOTROL) 5 MG tablet TAKE 1 TABLET(5 MG) BY MOUTH TWICE DAILY BEFORE A MEAL FOR DIABETES 180 tablet 1   lisinopril (ZESTRIL) 20 MG tablet TAKE 1 TABLET(20 MG) BY MOUTH DAILY FOR BLOOD PRESSURE 90 tablet 3   metFORMIN (GLUCOPHAGE) 500 MG tablet Take 1 tablet (500 mg total) by mouth 2 (two) times daily with a meal. for diabetes. 180 tablet 1   nystatin-triamcinolone ointment (MYCOLOG) Apply 1 Application topically 2 (two) times daily as needed (irritation).     oxybutynin (DITROPAN-XL) 5 MG 24 hr tablet TAKE 1 TABLET(5 MG) BY MOUTH AT BEDTIME FOR OVERACTIVE BLADDER 90 tablet 2   oxybutynin (OXYTROL) 3.9 MG/24HR Place 1 patch onto the skin See admin instructions. Change every 5 days     Polyethyl Glycol-Propyl Glycol (LUBRICANT EYE DROPS) 0.4-0.3 % SOLN Place 1-2 drops into both eyes 2 (two) times daily.     prochlorperazine (COMPAZINE) 10 MG tablet TAKE 1 TABLET(10 MG) BY MOUTH EVERY 6 HOURS AS NEEDED FOR NAUSEA OR VOMITING (Patient not taking: Reported on 01/04/2023) 30 tablet 0   senna-docusate (SENOKOT-S) 8.6-50 MG tablet Take 2 tablets by mouth 2 (two) times daily. (Patient taking differently: Take 1 tablet by mouth every other day.)     No current facility-administered medications for this visit.    SURGICAL HISTORY:  Past Surgical History:  Procedure Laterality Date   ABDOMINAL EXPOSURE N/A 07/26/2017   Procedure: ABDOMINAL EXPOSURE;  Surgeon: Chuck Hint, MD;  Location[facility] OR;  Service:  Vascular;  Laterality: N/A;   ANTERIOR CERVICAL DECOMP/DISCECTOMY FUSION  2001   ANTERIOR LUMBAR FUSION N/A 07/26/2017   Procedure: Lumbar five-Sacral one Anterior lumbar interbody fusion with Dr. Waverly Ferrari for approach;  Surgeon: Ditty, Loura Halt, MD;  Location: [facility] OR;  Service: Neurosurgery;  Laterality: N/A;   APPLICATION OF ROBOTIC ASSISTANCE FOR SPINAL PROCEDURE  12/19/2016   Procedure: APPLICATION OF ROBOTIC ASSISTANCE FOR SPINAL PROCEDURE;  Surgeon: Loura Halt Ditty, MD;  Location: [facility] OR;  Service: Neurosurgery;;   APPLICATION OF ROBOTIC ASSISTANCE FOR SPINAL PROCEDURE N/A 07/26/2017   Procedure: APPLICATION OF ROBOTIC ASSISTANCE FOR SPINAL PROCEDURE;  Surgeon: Ditty, Loura Halt, MD;  Location: [facility] OR;  Service: Neurosurgery;  Laterality: N/A;   APPLICATION OF  ROBOTIC ASSISTANCE FOR SPINAL PROCEDURE N/A 09/02/2017   Procedure: APPLICATION OF ROBOTIC ASSISTANCE FOR SPINAL PROCEDURE;  Surgeon: Ditty, Loura Halt, MD;  Location: [facility] OR;  Service: Neurosurgery;  Laterality: N/A;   BACK SURGERY     BLADDER SUSPENSION  1991   tack   BRONCHIAL BIOPSY  09/15/2021   Procedure: BRONCHIAL BIOPSIES;  Surgeon: Josephine Igo, DO;  Location: [facility];  Service: Pulmonary;;   BRONCHIAL BIOPSY  05/04/2022   Procedure: BRONCHIAL BIOPSIES;  Surgeon: Josephine Igo, DO;  Location: [facility];  Service: Pulmonary;;   BRONCHIAL BRUSHINGS  09/15/2021   Procedure: BRONCHIAL BRUSHINGS;  Surgeon: Josephine Igo, DO;  Location: [facility];  Service: Pulmonary;;   BRONCHIAL NEEDLE ASPIRATION BIOPSY  09/15/2021   Procedure: BRONCHIAL NEEDLE ASPIRATION BIOPSIES;  Surgeon: Josephine Igo, DO;  Location: [facility];  Service: Pulmonary;;   BRONCHIAL NEEDLE ASPIRATION BIOPSY  05/04/2022   Procedure: BRONCHIAL NEEDLE ASPIRATION BIOPSIES;  Surgeon: Josephine Igo, DO;  Location: [facility];  Service: Pulmonary;;   BRONCHIAL WASHINGS  09/15/2021   Procedure: BRONCHIAL WASHINGS;   Surgeon: Josephine Igo, DO;  Location: [facility];  Service: Pulmonary;;   FIDUCIAL MARKER PLACEMENT  09/15/2021   Procedure: FIDUCIAL MARKER PLACEMENT;  Surgeon: Josephine Igo, DO;  Location: [facility];  Service: Pulmonary;;   INGUINAL HERNIA REPAIR Right 1991   INTERCOSTAL NERVE BLOCK Left 06/16/2022   Procedure: INTERCOSTAL NERVE BLOCK;  Surgeon: Corliss Skains, MD;  Location: [facility] OR;  Service: Thoracic;  Laterality: Left;   LUMBAR FUSION  2016; 2017; 07/26/2017   L4-5; L2-3; L5-S1   LYMPH NODE DISSECTION Left 06/16/2022   Procedure: LYMPH NODE DISSECTION;  Surgeon: Corliss Skains, MD;  Location: [facility] OR;  Service: Thoracic;  Laterality: Left;   POSTERIOR LUMBAR FUSION 4 WITH HARDWARE REMOVAL N/A 09/02/2017   Procedure: Lumbar three-four redo laminectomy; Repositioning of Left Sacral two screw; Extension of lumbar fusion to Thoracic twelve;  Surgeon: Ditty, Loura Halt, MD;  Location: [facility] OR;  Service: Neurosurgery;  Laterality: N/A;   TONSILLECTOMY AND ADENOIDECTOMY  1959   TUMOR EXCISION     WERTHIN'S TUMORS BOTH SIDES OF NECK   VAGINAL HYSTERECTOMY  1979   VIDEO BRONCHOSCOPY WITH RADIAL ENDOBRONCHIAL ULTRASOUND  09/15/2021   Procedure: VIDEO BRONCHOSCOPY WITH RADIAL ENDOBRONCHIAL ULTRASOUND;  Surgeon: Josephine Igo, DO;  Location: [facility];  Service: Pulmonary;;   VIDEO BRONCHOSCOPY WITH RADIAL ENDOBRONCHIAL ULTRASOUND  05/04/2022   Procedure: VIDEO BRONCHOSCOPY WITH RADIAL ENDOBRONCHIAL ULTRASOUND;  Surgeon: Josephine Igo, DO;  Location: [facility];  Service: Pulmonary;;    REVIEW OF SYSTEMS:  A comprehensive review of systems was negative.   PHYSICAL EXAMINATION: General appearance: alert, cooperative, and no distress Head: Normocephalic, without obvious abnormality, atraumatic Neck: no adenopathy, no JVD, supple, symmetrical, trachea midline, and thyroid not enlarged, symmetric, no tenderness/mass/nodules Lymph nodes: Cervical, supraclavicular, and  axillary nodes normal. Resp: clear to auscultation bilaterally Back: symmetric, no curvature. ROM normal. No CVA tenderness. Cardio: regular rate and rhythm, S1, S2 normal, no murmur, click, rub or gallop GI: soft, non-tender; bowel sounds normal; no masses,  no organomegaly Extremities: extremities normal, atraumatic, no cyanosis or edema  ECOG PERFORMANCE STATUS: 1 - Symptomatic but completely ambulatory  Blood pressure 130/71, pulse 81, temperature 98.5 F (36.9 C), temperature source Oral, resp. rate 17, height 5\' 8"  (1.727 m), weight 165 lb 3.2 oz (74.9 kg), SpO2 100 %.  LABORATORY DATA: Lab Results  Component Value Date   WBC  4.9 02/24/2023   HGB 10.9 (L) 02/24/2023   HCT 33.7 (L) 02/24/2023   MCV 88.9 02/24/2023   PLT 198 02/24/2023      Chemistry      Component Value Date/Time   NA 140 02/24/2023 1011   K 4.6 02/24/2023 1011   CL 105 02/24/2023 1011   CO2 27 02/24/2023 1011   BUN 14 02/24/2023 1011   CREATININE 0.79 02/24/2023 1011   CREATININE 0.84 07/31/2021 1600      Component Value Date/Time   CALCIUM 9.3 02/24/2023 1011   ALKPHOS 105 02/24/2023 1011   AST 17 02/24/2023 1011   ALT 18 02/24/2023 1011   BILITOT 0.4 02/24/2023 1011       RADIOGRAPHIC STUDIES: CT Chest W Contrast  Result Date: 02/28/2023 CLINICAL DATA:  Non-small cell lung cancer, assess treatment response. * Tracking Code: BO * EXAM: CT CHEST WITH CONTRAST TECHNIQUE: Multidetector CT imaging of the chest was performed during intravenous contrast administration. RADIATION DOSE REDUCTION: This exam was performed according to the departmental dose-optimization program which includes automated exposure control, adjustment of the mA and/or kV according to patient size and/or use of iterative reconstruction technique. CONTRAST:  75mL OMNIPAQUE IOHEXOL 300 MG/ML  SOLN COMPARISON:  Chest CT 10/28/2022 and 03/22/2022.  PET-CT 04/09/2022. FINDINGS: Cardiovascular: No acute vascular findings are seen.  There is atherosclerosis of the aorta, great vessels and coronary arteries. The heart size is normal. There is no pericardial effusion. Mediastinum/Nodes: There are no enlarged mediastinal, hilar or axillary lymph nodes. The thyroid gland, trachea and esophagus demonstrate no significant findings. Lungs/Pleura: No pleural effusion or pneumothorax. Stable postsurgical changes from previous left lower lobectomy. There is mild centrilobular and paraseptal emphysema with mild scarring posteriorly in the left upper lobe. No suspicious pulmonary nodularity. Upper abdomen: The visualized upper abdomen appears stable, without acute findings. A small low-density lesion centrally in the liver is unchanged, likely a cyst. Aortic atherosclerosis noted. Musculoskeletal/Chest wall: There is no chest wall mass or suspicious osseous finding. Stable postsurgical changes in the lower cervical and the thoracolumbar spine. Multilevel spondylosis. IMPRESSION: 1. Stable chest CT status post left lower lobectomy. 2. No evidence of local recurrence or metastatic disease. 3. Aortic Atherosclerosis (ICD10-I70.0) and Emphysema (ICD10-J43.9). Electronically Signed   By: Carey Bullocks M.D.   On: 02/28/2023 16:02   MR BRAIN W WO CONTRAST  Result Date: 02/28/2023 CLINICAL DATA:  Follow-up metastatic disease EXAM: MRI HEAD WITHOUT AND WITH CONTRAST TECHNIQUE: Multiplanar, multiecho pulse sequences of the brain and surrounding structures were obtained without and with intravenous contrast. CONTRAST:  7.84mL GADAVIST GADOBUTROL 1 MMOL/ML IV SOLN COMPARISON:  05/22/2022 brain MRI FINDINGS: Brain: No new or worrisome enhancement or brain edema to suggest metastatic disease. Mild chronic small vessel ischemia in the cerebral white matter. No incidental infarct, hemorrhage, hydrocephalus, or collection. Vascular: Normal flow voids and vascular enhancements. Skull and upper cervical spine: Normal marrow. Degenerative facet spurring. Sinuses/Orbits:  Negative IMPRESSION: Negative for metastatic disease. Electronically Signed   By: Tiburcio Pea M.D.   On: 02/28/2023 16:01    ASSESSMENT AND PLAN: This is a very pleasant 72 years old white female with Stage IIb (T3, N0, M0)  non-small cell lung cancer, adenocarcinoma presented with large left lower lobe lung mass diagnosed in August 2023 status post left lower lobectomy with lymph node sampling under the care of Dr. Cliffton Asters on June 16, 2022 with tumor size of 5.5 cm and visceropleural involvement. Molecular studies by foundation 1 showed no actionable mutation  and she has negative PD-L1 expression. The patient started adjuvant systemic chemotherapy with carboplatin for AUC of 5 and Alimta 500 Mg/M2 status post 4 cycles.  Last dose was giving October 11, 2022.  The patient is currently on observation and she is feeling fine with no concerning complaints. She had repeat CT scan of the chest performed recently.  I personally and independently reviewed the scan and discussed the result with the patient and her husband. Her scan showed no concerning findings for disease recurrence or metastasis.  She also had MRI of the brain because of dizzy spells and this was negative for metastatic disease. I recommended for the patient to continue on observation with repeat CT scan of the chest in 6 months. She was advised to call immediately if she has any other concerning symptoms in the interval. The patient voices understanding of current disease status and treatment options and is in agreement with the current care plan.  All questions were answered. The patient knows to call the clinic with any problems, questions or concerns. We can certainly see the patient much sooner if necessary.  The total time spent in the appointment was 20 minutes.  Disclaimer: This note was dictated with voice recognition software. Similar sounding words can inadvertently be transcribed and may not be corrected upon  review.

## 2023-05-01 ENCOUNTER — Other Ambulatory Visit: Payer: Self-pay | Admitting: Primary Care

## 2023-05-01 DIAGNOSIS — M62838 Other muscle spasm: Secondary | ICD-10-CM

## 2023-05-10 DIAGNOSIS — L570 Actinic keratosis: Secondary | ICD-10-CM | POA: Diagnosis not present

## 2023-05-10 DIAGNOSIS — L821 Other seborrheic keratosis: Secondary | ICD-10-CM | POA: Diagnosis not present

## 2023-05-10 DIAGNOSIS — L930 Discoid lupus erythematosus: Secondary | ICD-10-CM | POA: Diagnosis not present

## 2023-05-23 ENCOUNTER — Other Ambulatory Visit: Payer: Self-pay

## 2023-05-23 DIAGNOSIS — M4727 Other spondylosis with radiculopathy, lumbosacral region: Secondary | ICD-10-CM

## 2023-05-23 DIAGNOSIS — M4714 Other spondylosis with myelopathy, thoracic region: Secondary | ICD-10-CM

## 2023-05-23 MED ORDER — GABAPENTIN 300 MG PO CAPS
300.0000 mg | ORAL_CAPSULE | Freq: Two times a day (BID) | ORAL | 0 refills | Status: DC
Start: 2023-05-23 — End: 2023-08-16

## 2023-05-24 ENCOUNTER — Telehealth: Payer: Self-pay | Admitting: Primary Care

## 2023-05-24 DIAGNOSIS — M62838 Other muscle spasm: Secondary | ICD-10-CM

## 2023-05-24 NOTE — Telephone Encounter (Signed)
I sent a 60-month supply of baclofen to her pharmacy in mid April 2024.  She should have enough to last until mid October.  Is this not the case?

## 2023-05-24 NOTE — Telephone Encounter (Signed)
Prescription Request  05/24/2023  LOV: 12/29/2022  What is the name of the medication or equipment? baclofen (LIORESAL) 10 MG tablet   Have you contacted your pharmacy to request a refill? No   Which pharmacy would you like this sent to?  [id number] - [place], [place] - [address] AT [facility] [address] [place] [address] Phone: [phone] Fax: [phone]     Patient notified that their request is being sent to the clinical staff for review and that they should receive a response within 2 business days.   Please advise at Mobile [phone] (mobile)

## 2023-05-25 MED ORDER — BACLOFEN 10 MG PO TABS
10.0000 mg | ORAL_TABLET | Freq: Two times a day (BID) | ORAL | 0 refills | Status: DC
Start: 2023-05-25 — End: 2023-08-16

## 2023-05-25 NOTE — Addendum Note (Signed)
Addended by: Doreene Nest on: 05/25/2023 10:29 AM   Modules accepted: Orders

## 2023-05-25 NOTE — Telephone Encounter (Signed)
Called and spoke with patient, she states she is completely out. Verified she is taking correctly, she takes no more than 1 tab bid. She is also unsure how she ran out of pills already.

## 2023-05-25 NOTE — Telephone Encounter (Signed)
Noted. Refill(s) sent to pharmacy.  

## 2023-06-20 ENCOUNTER — Other Ambulatory Visit: Payer: Self-pay

## 2023-06-20 DIAGNOSIS — E785 Hyperlipidemia, unspecified: Secondary | ICD-10-CM

## 2023-06-20 MED ORDER — ATORVASTATIN CALCIUM 40 MG PO TABS: 40.0000 mg | ORAL_TABLET | Freq: Every day | ORAL | 0 refills | Status: DC

## 2023-06-21 ENCOUNTER — Telehealth: Payer: Self-pay | Admitting: Primary Care

## 2023-06-21 DIAGNOSIS — N3281 Overactive bladder: Secondary | ICD-10-CM

## 2023-06-21 MED ORDER — OXYBUTYNIN CHLORIDE ER 5 MG PO TB24
5.0000 mg | ORAL_TABLET | Freq: Every day | ORAL | 0 refills | Status: DC
Start: 2023-06-21 — End: 2023-09-11

## 2023-06-21 NOTE — Addendum Note (Signed)
Addended by: Doreene Nest on: 06/21/2023 03:21 PM   Modules accepted: Orders

## 2023-06-21 NOTE — Telephone Encounter (Signed)
Prescription Request  06/21/2023  LOV: 12/29/2022  What is the name of the medication or equipment?  oxybutynin (DITROPAN-XL) 5 MG 24 hr tablet   Have you contacted your pharmacy to request a refill? Yes   Which pharmacy would you like this sent to?  [id number] - [place], [place] - [address] AT [facility] [address] [place] [address] Phone: [phone] Fax: [phone]    Patient notified that their request is being sent to the clinical staff for review and that they should receive a response within 2 business days.   Please advise at [facility] [phone]

## 2023-06-21 NOTE — Telephone Encounter (Signed)
Refills sent to pharmacy. 

## 2023-06-30 ENCOUNTER — Ambulatory Visit: Payer: PPO | Admitting: Primary Care

## 2023-06-30 ENCOUNTER — Encounter: Payer: Self-pay | Admitting: Primary Care

## 2023-06-30 VITALS — BP 122/70 | HR 78 | Temp 98.1°F | Ht 68.0 in | Wt 172.0 lb

## 2023-06-30 DIAGNOSIS — M62838 Other muscle spasm: Secondary | ICD-10-CM

## 2023-06-30 DIAGNOSIS — I10 Essential (primary) hypertension: Secondary | ICD-10-CM | POA: Diagnosis not present

## 2023-06-30 DIAGNOSIS — Z85118 Personal history of other malignant neoplasm of bronchus and lung: Secondary | ICD-10-CM | POA: Diagnosis not present

## 2023-06-30 DIAGNOSIS — N3281 Overactive bladder: Secondary | ICD-10-CM

## 2023-06-30 DIAGNOSIS — Z7984 Long term (current) use of oral hypoglycemic drugs: Secondary | ICD-10-CM | POA: Diagnosis not present

## 2023-06-30 DIAGNOSIS — G894 Chronic pain syndrome: Secondary | ICD-10-CM

## 2023-06-30 DIAGNOSIS — Z23 Encounter for immunization: Secondary | ICD-10-CM

## 2023-06-30 DIAGNOSIS — Z0001 Encounter for general adult medical examination with abnormal findings: Secondary | ICD-10-CM | POA: Diagnosis not present

## 2023-06-30 DIAGNOSIS — K130 Diseases of lips: Secondary | ICD-10-CM

## 2023-06-30 DIAGNOSIS — E1165 Type 2 diabetes mellitus with hyperglycemia: Secondary | ICD-10-CM | POA: Diagnosis not present

## 2023-06-30 DIAGNOSIS — E1142 Type 2 diabetes mellitus with diabetic polyneuropathy: Secondary | ICD-10-CM

## 2023-06-30 DIAGNOSIS — E785 Hyperlipidemia, unspecified: Secondary | ICD-10-CM

## 2023-06-30 LAB — MICROALBUMIN / CREATININE URINE RATIO
Creatinine,U: 90.3 mg/dL
Microalb Creat Ratio: 1 mg/g (ref 0.0–30.0)
Microalb, Ur: 0.9 mg/dL (ref 0.0–1.9)

## 2023-06-30 LAB — POCT GLYCOSYLATED HEMOGLOBIN (HGB A1C): Hemoglobin A1C: 6.3 % — AB (ref 4.0–5.6)

## 2023-06-30 NOTE — Assessment & Plan Note (Signed)
Immunizations UTD. Influenza vaccine provided today.   Mammogram UTD. Bone density scan due, discussed this with patient.  She will complete next year with her mammogram. Colon cancer screening UTD, due December 2025  Discussed the importance of a healthy diet and regular exercise in order for weight loss, and to reduce the risk of further co-morbidity.  Exam stable. Labs reviewed.  Follow up in 1 year for repeat physical.

## 2023-06-30 NOTE — Assessment & Plan Note (Signed)
Following with oncology, office notes and labs reviewed from June 2024.  Repeat CT scan in December 2024.

## 2023-06-30 NOTE — Assessment & Plan Note (Signed)
Controlled.  Continue amlodipine 10 mg daily, lisinopril 20 mg daily. CMP reviewed from June 2024.

## 2023-06-30 NOTE — Assessment & Plan Note (Signed)
Controlled.  Continue to monitor.  

## 2023-06-30 NOTE — Assessment & Plan Note (Addendum)
Reviewed lipid panel from April 2024.  Continue atorvastatin 40 mg daily. Consider holding temporarily for increased muscle spasticity

## 2023-06-30 NOTE — Assessment & Plan Note (Signed)
Waxes and wanes.  Increase baclofen to 10 mg 3 times daily. She will update when she needs a refill.

## 2023-06-30 NOTE — Assessment & Plan Note (Signed)
Deteriorated.  Consider trial of holding statin to see if this helps.  Increase baclofen to 10 mg 3 times daily. She will update.

## 2023-06-30 NOTE — Patient Instructions (Signed)
Increase your baclofen muscle relaxant medication to 3 times daily.  Let me know when you are getting low on your current prescription.  Complete your bone density scan next year.  Please schedule a follow up visit for 6 months for a diabetes check.  It was a pleasure to see you today!

## 2023-06-30 NOTE — Progress Notes (Signed)
Subjective:    Patient ID: Megan [name], female    DOB: [birth year], 72 y.o.   MRN: [id number]  Diabetes Pertinent negatives for hypoglycemia include no dizziness, headaches or nervousness/anxiousness. Pertinent negatives for diabetes include no chest pain.    Megan [name] is a very pleasant 72 y.o. female who presents today for complete physical and follow up of chronic conditions.  She would also like to discuss continued right lower extremity muscle spasms. Historically, controlled with baclofen 10 mg BID but over the last 2 months she's noticed increased spacticity to the right lower extremity.  Her spasticity occurred is during the day and evening.  Previously managed on baclofen 10 mg 3 times daily which was effective.  She would like to return to this regimen.  She recently picked up her refill.  Immunizations: -Tetanus: Completed in 2016 -Influenza: Influenza vaccine provided today.  -Shingles: Completed Shingrix series -Pneumonia: Completed Prevnar 20 in 2022  Diet: Fair diet.  Exercise: No regular exercise.  Eye exam: Completes annually  Dental exam: Completed years ago    Mammogram: May 2024 Bone Density Scan: 2021  Colonoscopy: Completed Cologuard in December 2022 Lung Cancer Screening: Completed in June 2024  BP Readings from Last 3 Encounters:  06/30/23 122/70  03/01/23 130/71  12/29/22 136/72       Review of Systems  Constitutional:  Negative for unexpected weight change.  HENT:  Negative for rhinorrhea.   Respiratory:  Negative for cough and shortness of breath.   Cardiovascular:  Negative for chest pain.  Gastrointestinal:  Negative for constipation and diarrhea.  Genitourinary:  Negative for difficulty urinating.  Musculoskeletal:  Positive for arthralgias and myalgias.  Skin:  Negative for rash.  Allergic/Immunologic: Negative for environmental allergies.  Neurological:  Negative for dizziness and headaches.  Psychiatric/Behavioral:  The  patient is not nervous/anxious.          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   Constipation due to pain medication    Debility    Depression    Dyspnea    W/ PHYS CONDITION    Emphysema of lung (HCC)    Foul smelling urine 07/22/2020   GERD (gastroesophageal reflux disease)    History of bronchitis    "not since I quit smoking" (07/26/2017)   History of kidney stones    History of shingles    Hyperlipidemia    takes Fish Oil daily   Hypertension    Hypoalbuminemia due to protein-calorie malnutrition (HCC)    L3 vertebral fracture (HCC) 09/01/2017   Lung cancer (HCC) 2023   Lung nodule 04/21/2022   Lung nodule seen on imaging study 08/14/2021   Added automatically from request for surgery [id number]   Myelopathy (HCC) 12/21/2016   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   Neuromuscular disorder (HCC)    tingling toes   Paraparesis (HCC) 12/15/2016   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   Pneumonia 2009   PONV (postoperative nausea and vomiting)    Radiculopathy 07/28/2017   Restless leg    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   Type 2 diabetes mellitus (HCC)    Uterine cancer (HCC) 1979   S/P hysterectomy   Weakness    numbness and tingling in  both feet r/t back    Social History   Socioeconomic History   Marital status: Married    Spouse name: Megan [name]   Number of children: 1   Years of education: 12   Highest education level: 12th grade  Occupational History   Not on file  Tobacco Use   Smoking status: Former    Current packs/day: 0.00    Average packs/day: 1.5 packs/day for 35.0 years (52.5 ttl pk-yrs)    Types: Cigarettes    Start date: 05/14/1971    Quit date: 05/13/2006     Years since quitting: 17.1   Smokeless tobacco: Never  Vaping Use   Vaping status: Never Used  Substance and Sexual Activity   Alcohol use: Yes    Alcohol/week: 2.0 standard drinks of alcohol    Types: 2 Glasses of wine per week   Drug use: No   Sexual activity: Not Currently    Birth control/protection: Surgical  Other Topics Concern   Not on file  Social History Narrative   Lives w/ husband   Married.   1 child, 1 grandchildren.   Retired. Once worked for [employer].   Enjoys reading.    Social Determinants of Health   Financial Resource Strain: Low Risk  (06/26/2023)   Overall Financial Resource Strain (CARDIA)    Difficulty of Paying Living Expenses: Not hard at all  Food Insecurity: No Food Insecurity (06/26/2023)   Hunger Vital Sign    Worried About Running Out of Food in the Last Year: Never true    Ran Out of Food in the Last Year: Never true  Transportation Needs: No Transportation Needs (06/26/2023)   PRAPARE - Administrator, Civil Service (Medical): No    Lack of Transportation (Non-Medical): No  Physical Activity: Inactive (06/26/2023)   Exercise Vital Sign    Days of Exercise per Week: 0 days    Minutes of Exercise per Session: 10 min  Stress: Stress Concern Present (06/26/2023)   [employer] of Occupational Health - Occupational Stress Questionnaire    Feeling of Stress : To some extent  Social Connections: Moderately Isolated (06/26/2023)   Social Connection and Isolation Panel [NHANES]    Frequency of Communication with Friends and Family: More than three times a week    Frequency of Social Gatherings with Friends and Family: Patient declined    Attends Religious Services: Never    Database administrator or Organizations: No    Attends Banker Meetings: Never    Marital Status: Married  Catering manager Violence: Not At Risk (01/04/2023)   Humiliation, Afraid, Rape, and Kick questionnaire    Fear of Current or  Ex-Partner: No    Emotionally Abused: No    Physically Abused: No    Sexually Abused: No    Past Surgical History:  Procedure Laterality Date   ABDOMINAL EXPOSURE N/A 07/26/2017   Procedure: ABDOMINAL EXPOSURE;  Surgeon: Chuck Hint, MD;  Location: [facility] OR;  Service: Vascular;  Laterality: N/A;   ANTERIOR CERVICAL DECOMP/DISCECTOMY FUSION  2001   ANTERIOR LUMBAR FUSION N/A 07/26/2017   Procedure: Lumbar five-Sacral one Anterior lumbar interbody fusion with Dr. Waverly Ferrari for approach;  Surgeon: Ditty, Loura Halt, MD;  Location: [facility] OR;  Service: Neurosurgery;  Laterality: N/A;   APPLICATION OF ROBOTIC ASSISTANCE FOR SPINAL PROCEDURE  12/19/2016   Procedure: APPLICATION OF ROBOTIC ASSISTANCE FOR SPINAL PROCEDURE;  Surgeon: Loura Halt Ditty, MD;  Location: [facility] OR;  Service: Neurosurgery;;  APPLICATION OF ROBOTIC ASSISTANCE FOR SPINAL PROCEDURE N/A 07/26/2017   Procedure: APPLICATION OF ROBOTIC ASSISTANCE FOR SPINAL PROCEDURE;  Surgeon: Ditty, Loura Halt, MD;  Location: [facility] OR;  Service: Neurosurgery;  Laterality: N/A;   APPLICATION OF ROBOTIC ASSISTANCE FOR SPINAL PROCEDURE N/A 09/02/2017   Procedure: APPLICATION OF ROBOTIC ASSISTANCE FOR SPINAL PROCEDURE;  Surgeon: Ditty, Loura Halt, MD;  Location: [facility] OR;  Service: Neurosurgery;  Laterality: N/A;   BACK SURGERY     BLADDER SUSPENSION  1991   tack   BRONCHIAL BIOPSY  09/15/2021   Procedure: BRONCHIAL BIOPSIES;  Surgeon: Josephine Igo, DO;  Location: [facility]COPY;  Service: Pulmonary;;   BRONCHIAL BIOPSY  05/04/2022   Procedure: BRONCHIAL BIOPSIES;  Surgeon: Josephine Igo, DO;  Location: [facility];  Service: Pulmonary;;   BRONCHIAL BRUSHINGS  09/15/2021   Procedure: BRONCHIAL BRUSHINGS;  Surgeon: Josephine Igo, DO;  Location: [facility];  Service: Pulmonary;;   BRONCHIAL NEEDLE ASPIRATION BIOPSY  09/15/2021   Procedure: BRONCHIAL NEEDLE ASPIRATION BIOPSIES;  Surgeon: Josephine Igo, DO;  Location: [facility]  ENDOSCOPY;  Service: Pulmonary;;   BRONCHIAL NEEDLE ASPIRATION BIOPSY  05/04/2022   Procedure: BRONCHIAL NEEDLE ASPIRATION BIOPSIES;  Surgeon: Josephine Igo, DO;  Location: [facility];  Service: Pulmonary;;   BRONCHIAL WASHINGS  09/15/2021   Procedure: BRONCHIAL WASHINGS;  Surgeon: Josephine Igo, DO;  Location: [facility];  Service: Pulmonary;;   FIDUCIAL MARKER PLACEMENT  09/15/2021   Procedure: FIDUCIAL MARKER PLACEMENT;  Surgeon: Josephine Igo, DO;  Location: [facility];  Service: Pulmonary;;   INGUINAL HERNIA REPAIR Right 1991   INTERCOSTAL NERVE BLOCK Left 06/16/2022   Procedure: INTERCOSTAL NERVE BLOCK;  Surgeon: Corliss Skains, MD;  Location: [facility] OR;  Service: Thoracic;  Laterality: Left;   LUMBAR FUSION  2016; 2017; 07/26/2017   L4-5; L2-3; L5-S1   LYMPH NODE DISSECTION Left 06/16/2022   Procedure: LYMPH NODE DISSECTION;  Surgeon: Corliss Skains, MD;  Location: [facility] OR;  Service: Thoracic;  Laterality: Left;   POSTERIOR LUMBAR FUSION 4 WITH HARDWARE REMOVAL N/A 09/02/2017   Procedure: Lumbar three-four redo laminectomy; Repositioning of Left Sacral two screw; Extension of lumbar fusion to Thoracic twelve;  Surgeon: Ditty, Loura Halt, MD;  Location: [facility] OR;  Service: Neurosurgery;  Laterality: N/A;   TONSILLECTOMY AND ADENOIDECTOMY  1959   TUMOR EXCISION     WERTHIN'S TUMORS BOTH SIDES OF NECK   VAGINAL HYSTERECTOMY  1979   VIDEO BRONCHOSCOPY WITH RADIAL ENDOBRONCHIAL ULTRASOUND  09/15/2021   Procedure: VIDEO BRONCHOSCOPY WITH RADIAL ENDOBRONCHIAL ULTRASOUND;  Surgeon: Josephine Igo, DO;  Location: [facility];  Service: Pulmonary;;   VIDEO BRONCHOSCOPY WITH RADIAL ENDOBRONCHIAL ULTRASOUND  05/04/2022   Procedure: VIDEO BRONCHOSCOPY WITH RADIAL ENDOBRONCHIAL ULTRASOUND;  Surgeon: Josephine Igo, DO;  Location: [facility];  Service: Pulmonary;;    Family History  Problem Relation Age of Onset   Diabetes Mother    Dementia Mother    Cirrhosis Mother         Non alcoholic   COPD Father    Diabetes Brother     Allergies  Allergen Reactions   Shellfish Allergy Anaphylaxis, Swelling and Other (See Comments)    Tongue swells   Oxycodone Nausea Only   Penicillins Rash and Other (See Comments)    Has patient had a PCN reaction causing immediate rash, facial/tongue/throat swelling, SOB or lightheadedness with hypotension: Yes Has patient had a PCN reaction causing severe rash involving mucus membranes or skin necrosis: No Has patient  had a PCN reaction that required hospitalization No Has patient had a PCN reaction occurring within the last 10 years: No If all of the above answers are "NO", then may proceed with Cephalosporin use.    Current Outpatient Medications on File Prior to Visit  Medication Sig Dispense Refill   acetaminophen (TYLENOL) 500 MG tablet Take 500 mg by mouth every 8 (eight) hours as needed (for pain).     amLODipine (NORVASC) 10 MG tablet Take 1 tablet (10 mg total) by mouth daily. For blood pressure 90 tablet 3   atorvastatin (LIPITOR) 40 MG tablet Take 1 tablet (40 mg total) by mouth daily. for cholesterol. 90 tablet 0   baclofen (LIORESAL) 10 MG tablet Take 1 tablet (10 mg total) by mouth 2 (two) times daily. For muscle spasms. 180 each 0   Biotin 5000 MCG CAPS Take 5,000 mcg by mouth every morning.      cetirizine (ZYRTEC) 10 MG tablet Take 1 tablet (10 mg total) by mouth daily. For allergies 90 tablet 0   Cholecalciferol (VITAMIN D) 50 MCG (2000 UT) tablet Take 2,000 Units by mouth daily.     Coenzyme Q10 (COQ10) 100 MG CAPS Take 100 mg by mouth every evening.     esomeprazole (NEXIUM 24HR) 20 MG capsule Take 1 capsule (20 mg total) by mouth daily at 12 [name]. 90 capsule 1   gabapentin (NEURONTIN) 300 MG capsule Take 1 capsule (300 mg total) by mouth 2 (two) times daily. For back pain 180 capsule 0   glipiZIDE (GLUCOTROL) 5 MG tablet TAKE 1 TABLET(5 MG) BY MOUTH TWICE DAILY BEFORE A MEAL FOR DIABETES 180 tablet 1    lisinopril (ZESTRIL) 20 MG tablet TAKE 1 TABLET(20 MG) BY MOUTH DAILY FOR BLOOD PRESSURE 90 tablet 3   metFORMIN (GLUCOPHAGE) 500 MG tablet Take 1 tablet (500 mg total) by mouth 2 (two) times daily with a meal. for diabetes. 180 tablet 1   oxybutynin (DITROPAN-XL) 5 MG 24 hr tablet Take 1 tablet (5 mg total) by mouth at bedtime. For overactive bladder 90 tablet 0   oxybutynin (OXYTROL) 3.9 MG/24HR Place 1 patch onto the skin See admin instructions. Change every 5 days     Polyethyl Glycol-Propyl Glycol (LUBRICANT EYE DROPS) 0.4-0.3 % SOLN Place 1-2 drops into both eyes 2 (two) times daily.     senna-docusate (SENOKOT-S) 8.6-50 MG tablet Take 2 tablets by mouth 2 (two) times daily. (Patient taking differently: Take 1 tablet by mouth every other day.)     fluocinonide cream (LIDEX) 0.05 % Apply 1 Application topically 2 (two) times daily as needed (irritation). (Patient not taking: Reported on 06/30/2023)     nystatin-triamcinolone ointment (MYCOLOG) Apply 1 Application topically 2 (two) times daily as needed (irritation).     No current facility-administered medications on file prior to visit.    BP 122/70 (BP Location: Right Arm, Patient Position: Sitting, Cuff Size: Large)   Pulse 78   Temp 98.1 F (36.7 C)   Ht 5\' 8"  (1.727 m)   Wt 172 lb (78 kg)   SpO2 98%   BMI 26.15 kg/m  Objective:   Physical Exam HENT:     Right Ear: Tympanic membrane and ear canal normal.     Left Ear: Tympanic membrane and ear canal normal.  Eyes:     Pupils: Pupils are equal, round, and reactive to light.  Cardiovascular:     Rate and Rhythm: Normal rate and regular rhythm.  Pulmonary:  Effort: Pulmonary effort is normal.     Breath sounds: Normal breath sounds.  Abdominal:     General: Bowel sounds are normal.     Palpations: Abdomen is soft.     Tenderness: There is no abdominal tenderness.  Musculoskeletal:        General: Normal range of motion.     Cervical back: Neck supple.  Skin:     General: Skin is warm and dry.  Neurological:     Mental Status: She is alert and oriented to person, place, and time.     Cranial Nerves: No cranial nerve deficit.     Deep Tendon Reflexes:     Reflex Scores:      Patellar reflexes are 2+ on the right side and 2+ on the left side. Psychiatric:        Mood and Affect: Mood normal.           Assessment & Plan:  Encounter for annual general medical examination with abnormal findings in adult Assessment & Plan: Immunizations UTD. Influenza vaccine provided today.   Mammogram UTD. Bone density scan due, discussed this with patient.  She will complete next year with her mammogram. Colon cancer screening UTD, due December 2025  Discussed the importance of a healthy diet and regular exercise in order for weight loss, and to reduce the risk of further co-morbidity.  Exam stable. Labs reviewed.  Follow up in 1 year for repeat physical.    Type 2 diabetes mellitus with hyperglycemia, without long-term current use of insulin (HCC) Assessment & Plan: Controlled with A1C of 6.3 today.  Continue glipizide 5 mg BID and metformin 500 mg BID.   Follow up in 6 months.  Orders: -     POCT glycosylated hemoglobin (Hb A1C) -     Microalbumin / creatinine urine ratio  Essential hypertension Assessment & Plan: Controlled.  Continue amlodipine 10 mg daily, lisinopril 20 mg daily. CMP reviewed from June 2024.   Angular cheilitis Assessment & Plan: Controlled.  Continue to monitor.    Type 2 diabetes mellitus with peripheral neuropathy (HCC) Assessment & Plan: Stable.  Continue gabapentin 300 mg BID.    Overactive bladder Assessment & Plan: Controlled.  Continue oxybutynin patches and oxybutynin XL 5 mg daily.    Chronic pain syndrome Assessment & Plan: Waxes and wanes.  Increase baclofen to 10 mg 3 times daily. She will update when she needs a refill.   Hyperlipidemia, unspecified hyperlipidemia  type Assessment & Plan: Reviewed lipid panel from April 2024.  Continue atorvastatin 40 mg daily. Consider holding temporarily for increased muscle spasticity   Muscle spasms of both lower extremities Assessment & Plan: Deteriorated.  Consider trial of holding statin to see if this helps.  Increase baclofen to 10 mg 3 times daily. She will update.   History of adenocarcinoma of lung Assessment & Plan: Following with oncology, office notes and labs reviewed from June 2024.  Repeat CT scan in December 2024.         Doreene Nest, NP

## 2023-06-30 NOTE — Assessment & Plan Note (Signed)
Controlled with A1C of 6.3 today.  Continue glipizide 5 mg BID and metformin 500 mg BID.   Follow up in 6 months.

## 2023-06-30 NOTE — Assessment & Plan Note (Signed)
Controlled.  Continue oxybutynin patches and oxybutynin XL 5 mg daily.

## 2023-06-30 NOTE — Assessment & Plan Note (Signed)
Stable.  Continue gabapentin 300 mg BID.

## 2023-07-07 ENCOUNTER — Telehealth: Payer: Self-pay | Admitting: Primary Care

## 2023-07-07 DIAGNOSIS — R6889 Other general symptoms and signs: Secondary | ICD-10-CM

## 2023-07-07 NOTE — Telephone Encounter (Signed)
Spoke with patient and she would like to do the labs now as she does not see the oncologist until December. Once labs are ordered patient will be called back to schedule lab appt.

## 2023-07-07 NOTE — Addendum Note (Signed)
Addended by: Doreene Nest on: 07/07/2023 06:01 PM   Modules accepted: Orders

## 2023-07-07 NOTE — Telephone Encounter (Signed)
Noted, lab orders placed. 

## 2023-07-07 NOTE — Telephone Encounter (Signed)
Potential causes of her symptoms could be thyroid disorder, anemia, medication side effects.  I am happy to order some labs if she wants to schedule a lab appointment to check thyroid and blood counts again.  She can also discuss this with her oncologist.  Let me know if she decides to do labs.

## 2023-07-07 NOTE — Telephone Encounter (Signed)
Patient called in and stated that she seen Jae Dire last week and had a question for her. She wanted to know if Jae Dire knew why she stayed cold all the time. Please advise. Thank you!

## 2023-07-08 ENCOUNTER — Other Ambulatory Visit: Payer: Self-pay | Admitting: Primary Care

## 2023-07-08 DIAGNOSIS — I10 Essential (primary) hypertension: Secondary | ICD-10-CM

## 2023-07-08 NOTE — Telephone Encounter (Signed)
Spoke with pt. She has been scheduled for a lab appointment on 07/12/23 at 0945. Nothing further was needed at this time.

## 2023-07-12 ENCOUNTER — Other Ambulatory Visit (INDEPENDENT_AMBULATORY_CARE_PROVIDER_SITE_OTHER): Payer: PPO

## 2023-07-12 ENCOUNTER — Other Ambulatory Visit: Payer: Self-pay | Admitting: Primary Care

## 2023-07-12 DIAGNOSIS — R6889 Other general symptoms and signs: Secondary | ICD-10-CM

## 2023-07-12 LAB — TSH: TSH: 4.24 u[IU]/mL (ref 0.35–5.50)

## 2023-07-12 LAB — IBC + FERRITIN
Ferritin: 35.4 ng/mL (ref 10.0–291.0)
Iron: 70 ug/dL (ref 42–145)
Saturation Ratios: 17.7 % — ABNORMAL LOW (ref 20.0–50.0)
TIBC: 396.2 ug/dL (ref 250.0–450.0)
Transferrin: 283 mg/dL (ref 212.0–360.0)

## 2023-07-12 LAB — CBC
HCT: 39 % (ref 36.0–46.0)
Hemoglobin: 12.4 g/dL (ref 12.0–15.0)
MCHC: 31.7 g/dL (ref 30.0–36.0)
MCV: 87.3 fL (ref 78.0–100.0)
Platelets: 243 10*3/uL (ref 150.0–400.0)
RBC: 4.47 Mil/uL (ref 3.87–5.11)
RDW: 15.7 % — ABNORMAL HIGH (ref 11.5–15.5)
WBC: 6.7 10*3/uL (ref 4.0–10.5)

## 2023-07-13 ENCOUNTER — Ambulatory Visit (INDEPENDENT_AMBULATORY_CARE_PROVIDER_SITE_OTHER): Payer: PPO

## 2023-07-13 DIAGNOSIS — R6889 Other general symptoms and signs: Secondary | ICD-10-CM

## 2023-07-13 LAB — T4, FREE: Free T4: 0.8 ng/dL (ref 0.60–1.60)

## 2023-07-29 ENCOUNTER — Other Ambulatory Visit: Payer: Self-pay

## 2023-07-29 DIAGNOSIS — I10 Essential (primary) hypertension: Secondary | ICD-10-CM

## 2023-07-29 MED ORDER — LISINOPRIL 20 MG PO TABS: 20.0000 mg | ORAL_TABLET | Freq: Every day | ORAL | 2 refills | Status: DC

## 2023-07-31 NOTE — Progress Notes (Signed)
This patient is appearing on a report for being at risk of failing the adherence measure for cholesterol (statin) medications this calendar year.   Medication: atorvastatin 40 mg PO daily Last fill date: 06/20/23 for 90 day supply  Insurance report was not up to date. No action needed at this time.  Fill hx adequate for other maintenance medications (amlodipine, glipizide, lisinopril, metformin).  Nils Pyle, PharmD PGY1 Pharmacy Resident

## 2023-08-16 ENCOUNTER — Other Ambulatory Visit: Payer: Self-pay

## 2023-08-16 DIAGNOSIS — L930 Discoid lupus erythematosus: Secondary | ICD-10-CM | POA: Diagnosis not present

## 2023-08-16 DIAGNOSIS — M62838 Other muscle spasm: Secondary | ICD-10-CM

## 2023-08-16 DIAGNOSIS — L821 Other seborrheic keratosis: Secondary | ICD-10-CM | POA: Diagnosis not present

## 2023-08-16 DIAGNOSIS — E1165 Type 2 diabetes mellitus with hyperglycemia: Secondary | ICD-10-CM

## 2023-08-16 DIAGNOSIS — M4714 Other spondylosis with myelopathy, thoracic region: Secondary | ICD-10-CM

## 2023-08-16 DIAGNOSIS — M4727 Other spondylosis with radiculopathy, lumbosacral region: Secondary | ICD-10-CM

## 2023-08-16 NOTE — Telephone Encounter (Signed)
Patient had CPE on 06/30/23

## 2023-08-17 MED ORDER — METFORMIN HCL 500 MG PO TABS: 500.0000 mg | ORAL_TABLET | Freq: Two times a day (BID) | ORAL | 1 refills | Status: DC

## 2023-08-17 MED ORDER — BACLOFEN 10 MG PO TABS
10.0000 mg | ORAL_TABLET | Freq: Three times a day (TID) | ORAL | 2 refills | Status: DC
Start: 2023-08-17 — End: 2023-11-24

## 2023-08-17 MED ORDER — GABAPENTIN 300 MG PO CAPS: 300.0000 mg | ORAL_CAPSULE | Freq: Two times a day (BID) | ORAL | 2 refills | Status: DC

## 2023-08-19 DIAGNOSIS — H0102A Squamous blepharitis right eye, upper and lower eyelids: Secondary | ICD-10-CM | POA: Diagnosis not present

## 2023-08-23 ENCOUNTER — Other Ambulatory Visit: Payer: Self-pay

## 2023-08-23 ENCOUNTER — Telehealth: Payer: Self-pay

## 2023-08-23 DIAGNOSIS — C3490 Malignant neoplasm of unspecified part of unspecified bronchus or lung: Secondary | ICD-10-CM

## 2023-08-23 NOTE — Telephone Encounter (Signed)
Patient called and LVM in regards to appts. Spoke with patient and she stated that she is experiencing bloating in the abdomen and some pain form surgery last year. Per Dr. Arbutus Ped- okay to add on CT abdomen/pelvis. Number to central scheduling [phone]) gave to patient to call and schedule.  Pt verbalized understanding.

## 2023-08-25 ENCOUNTER — Other Ambulatory Visit: Payer: Self-pay

## 2023-08-25 DIAGNOSIS — E1165 Type 2 diabetes mellitus with hyperglycemia: Secondary | ICD-10-CM

## 2023-08-25 MED ORDER — GLIPIZIDE 5 MG PO TABS: ORAL_TABLET | ORAL | 1 refills | Status: DC

## 2023-08-26 ENCOUNTER — Other Ambulatory Visit: Payer: Self-pay | Admitting: Internal Medicine

## 2023-08-26 ENCOUNTER — Inpatient Hospital Stay: Payer: PPO | Attending: Internal Medicine

## 2023-08-26 ENCOUNTER — Ambulatory Visit (HOSPITAL_COMMUNITY)
Admission: RE | Admit: 2023-08-26 | Discharge: 2023-08-26 | Disposition: A | Discharge status: Home / Self Care | Payer: PPO | Source: Ambulatory Visit | Attending: Internal Medicine | Admitting: Internal Medicine

## 2023-08-26 ENCOUNTER — Inpatient Hospital Stay: Payer: PPO

## 2023-08-26 DIAGNOSIS — K760 Fatty (change of) liver, not elsewhere classified: Secondary | ICD-10-CM | POA: Diagnosis not present

## 2023-08-26 DIAGNOSIS — Z85118 Personal history of other malignant neoplasm of bronchus and lung: Secondary | ICD-10-CM | POA: Insufficient documentation

## 2023-08-26 DIAGNOSIS — C3490 Malignant neoplasm of unspecified part of unspecified bronchus or lung: Secondary | ICD-10-CM

## 2023-08-26 DIAGNOSIS — K5730 Diverticulosis of large intestine without perforation or abscess without bleeding: Secondary | ICD-10-CM | POA: Diagnosis not present

## 2023-08-26 DIAGNOSIS — Z9221 Personal history of antineoplastic chemotherapy: Secondary | ICD-10-CM | POA: Insufficient documentation

## 2023-08-26 DIAGNOSIS — J432 Centrilobular emphysema: Secondary | ICD-10-CM | POA: Diagnosis not present

## 2023-08-26 DIAGNOSIS — Z87891 Personal history of nicotine dependence: Secondary | ICD-10-CM | POA: Insufficient documentation

## 2023-08-26 LAB — CMP (CANCER CENTER ONLY)
ALT: 21 U/L (ref 0–44)
AST: 19 U/L (ref 15–41)
Albumin: 4.3 g/dL (ref 3.5–5.0)
Alkaline Phosphatase: 82 U/L (ref 38–126)
Anion gap: 8 (ref 5–15)
BUN: 15 mg/dL (ref 8–23)
CO2: 26 mmol/L (ref 22–32)
Calcium: 9.2 mg/dL (ref 8.9–10.3)
Chloride: 105 mmol/L (ref 98–111)
Creatinine: 0.91 mg/dL (ref 0.44–1.00)
GFR, Estimated: >60 mL/min (ref >60)
Glucose, Bld: 147 mg/dL — ABNORMAL HIGH (ref 70–99)
Potassium: 4.5 mmol/L (ref 3.5–5.1)
Sodium: 139 mmol/L (ref 135–145)
Total Bilirubin: 0.4 mg/dL (ref <1.2)
Total Protein: 7.4 g/dL (ref 6.5–8.1)

## 2023-08-26 LAB — CBC WITH DIFFERENTIAL (CANCER CENTER ONLY)
Abs Immature Granulocytes: 0.01 10*3/uL (ref 0.00–0.07)
Basophils Absolute: 0 10*3/uL (ref 0.0–0.1)
Basophils Relative: 0 %
Eosinophils Absolute: 0.1 10*3/uL (ref 0.0–0.5)
Eosinophils Relative: 2 %
HCT: 37.1 % (ref 36.0–46.0)
Hemoglobin: 11.9 g/dL — ABNORMAL LOW (ref 12.0–15.0)
Immature Granulocytes: 0 %
Lymphocytes Relative: 36 %
Lymphs Abs: 1.5 10*3/uL (ref 0.7–4.0)
MCH: 28.5 pg (ref 26.0–34.0)
MCHC: 32.1 g/dL (ref 30.0–36.0)
MCV: 89 fL (ref 80.0–100.0)
Monocytes Absolute: 0.5 10*3/uL (ref 0.1–1.0)
Monocytes Relative: 12 %
Neutro Abs: 2.1 10*3/uL (ref 1.7–7.7)
Neutrophils Relative %: 50 %
Platelet Count: 204 10*3/uL (ref 150–400)
RBC: 4.17 MIL/uL (ref 3.87–5.11)
RDW: 15 % (ref 11.5–15.5)
WBC Count: 4.2 10*3/uL (ref 4.0–10.5)
nRBC: 0 % (ref 0.0–0.2)

## 2023-08-26 MED ORDER — IOHEXOL 300 MG/ML  SOLN
75.0000 mL | Freq: Once | INTRAMUSCULAR | Status: AC | PRN
  Administered 2023-08-26: 75 mL via INTRAVENOUS

## 2023-08-30 ENCOUNTER — Inpatient Hospital Stay: Payer: PPO | Admitting: Internal Medicine

## 2023-08-30 VITALS — BP 127/66 | HR 66 | Temp 98.2°F | Resp 17 | Wt 175.3 lb

## 2023-08-30 DIAGNOSIS — C3490 Malignant neoplasm of unspecified part of unspecified bronchus or lung: Secondary | ICD-10-CM | POA: Diagnosis not present

## 2023-08-30 DIAGNOSIS — Z9221 Personal history of antineoplastic chemotherapy: Secondary | ICD-10-CM | POA: Diagnosis not present

## 2023-08-30 DIAGNOSIS — Z85118 Personal history of other malignant neoplasm of bronchus and lung: Secondary | ICD-10-CM | POA: Diagnosis not present

## 2023-08-30 DIAGNOSIS — Z87891 Personal history of nicotine dependence: Secondary | ICD-10-CM | POA: Diagnosis not present

## 2023-08-30 NOTE — Progress Notes (Signed)
[facility] [phone]   [phone]  OFFICE PROGRESS NOTE  Doreene Nest, NP [address] [place] [address]  DIAGNOSIS: Stage IIb (T3, N0, M0)  non-small cell lung cancer, adenocarcinoma presented with large left lower lobe lung mass diagnosed in August 2023  Molecular studies by foundation 1 showed no actionable mutation and she has negative PD-L1 expression.  PRIOR THERAPY:  1) Status post left lower lobectomy with lymph node sampling under the care of Dr. Cliffton Asters on June 16, 2022 with tumor size of 5.5 cm and visceropleural involvement. 2) Adjuvant systemic chemotherapy with carboplatin for AUC of 5 and Alimta 500 Mg/M2.  First dose August 09, 2022.  Status post 4 cycles.  CURRENT THERAPY: Observation.  INTERVAL HISTORY: Megan [name] 72 y.o. female returns to the clinic today for follow-up visit accompanied by her husband.Discussed the use of AI scribe software for clinical note transcription with the patient, who gave verbal consent to proceed.  History of Present Illness   The patient, a 72 year old with a history of lower lobe lobectomy for lung cancer, has been on a regimen of four cycles of adjuvant chemotherapy with carboplatin and Alimta. Over the past six months, the patient reports significant improvement in post-surgical pain, which has now almost completely resolved.  However, she has been experiencing a new cough and occasional shortness of breath, particularly upon exertion. These symptoms resolve with rest. The patient also reports intermittent episodes of nausea, but denies any vomiting or diarrhea. There has been no recent weight loss; in fact, the patient reports a slight weight gain of two pounds.  The patient also mentions a sensation of discomfort when taking a deep breath, localized to the area of her previous surgical site. This discomfort is not associated with any other symptoms and is thought to be  related to post-surgical changes.  The patient's medication regimen has remained unchanged, and she denies any new medications. She is scheduled for a follow-up visit in six months.        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   Constipation due to pain medication    Debility    Depression    Dyspnea    W/ PHYS CONDITION    Emphysema of lung (HCC)    Foul smelling urine 07/22/2020   GERD (gastroesophageal reflux disease)    History of bronchitis    "not since I quit smoking" (07/26/2017)   History of kidney stones    History of shingles    Hyperlipidemia    takes Fish Oil daily   Hypertension    Hypoalbuminemia due to protein-calorie malnutrition (HCC)    L3 vertebral fracture (HCC) 09/01/2017   Lung cancer (HCC) 2023   Lung nodule 04/21/2022   Lung nodule seen on imaging study 08/14/2021   Added automatically from request for surgery [id number]   Myelopathy (HCC) 12/21/2016   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  MEDICATIONS:  Current Outpatient Medications  Medication Sig Dispense Refill   acetaminophen (TYLENOL) 500 MG tablet Take 500 mg by mouth every 8 (eight) hours as needed (for pain).      amLODipine (NORVASC) 10 MG tablet TAKE 1 TABLET(10 MG) BY MOUTH DAILY FOR BLOOD PRESSURE 90 tablet 3   atorvastatin (LIPITOR) 40 MG tablet Take 1 tablet (40 mg total) by mouth daily. for cholesterol. 90 tablet 0   baclofen (LIORESAL) 10 MG tablet Take 1 tablet (10 mg total) by mouth 3 (three) times daily. For muscle spasms. 270 each 2   Biotin 5000 MCG CAPS Take 5,000 mcg by mouth every morning.      cetirizine (ZYRTEC) 10 MG tablet Take 1 tablet (10 mg total) by mouth daily. For allergies 90 tablet 0   Cholecalciferol (VITAMIN D) 50 MCG (2000 UT) tablet Take 2,000 Units by mouth daily.     Coenzyme Q10 (COQ10) 100 MG CAPS Take 100 mg by mouth every evening.     esomeprazole (NEXIUM 24HR) 20 MG capsule Take 1 capsule (20 mg total) by mouth daily at 12 noon. 90 capsule 1   fluocinonide cream (LIDEX) 0.05 % Apply 1 Application topically 2 (two) times daily as needed (irritation). (Patient not taking: Reported on 06/30/2023)     gabapentin (NEURONTIN) 300 MG capsule Take 1 capsule (300 mg total) by mouth 2 (two) times daily. For back pain 180 capsule 2   glipiZIDE (GLUCOTROL) 5 MG tablet TAKE 1 TABLET(5 MG) BY MOUTH TWICE DAILY BEFORE A MEAL FOR DIABETES 180 tablet 1   lisinopril (ZESTRIL) 20 MG tablet Take 1 tablet (20 mg total) by mouth daily. for blood pressure. 90 tablet 2   metFORMIN (GLUCOPHAGE) 500 MG tablet Take 1 tablet (500 mg total) by mouth 2 (two) times daily with a meal. for diabetes. 180 tablet 1   nystatin-triamcinolone ointment (MYCOLOG) Apply 1 Application topically 2 (two) times daily as needed (irritation).     oxybutynin (DITROPAN-XL) 5 MG 24 hr tablet Take 1 tablet (5 mg total) by mouth at bedtime. For overactive bladder 90 tablet 0   oxybutynin (OXYTROL) 3.9 MG/24HR Place 1 patch onto the skin See admin instructions. Change every 5 days     Polyethyl Glycol-Propyl Glycol (LUBRICANT EYE DROPS) 0.4-0.3 % SOLN Place 1-2 drops into both eyes 2 (two) times daily.     senna-docusate  (SENOKOT-S) 8.6-50 MG tablet Take 2 tablets by mouth 2 (two) times daily. (Patient taking differently: Take 1 tablet by mouth every other day.)     No current facility-administered medications for this visit.    SURGICAL HISTORY:  Past Surgical History:  Procedure Laterality Date   ABDOMINAL EXPOSURE N/A 07/26/2017   Procedure: ABDOMINAL EXPOSURE;  Surgeon: Chuck Hint, MD;  Location[facility] OR;  Service: Vascular;  Laterality: N/A;   ANTERIOR CERVICAL DECOMP/DISCECTOMY FUSION  2001   ANTERIOR LUMBAR FUSION N/A 07/26/2017   Procedure: Lumbar five-Sacral one Anterior lumbar interbody fusion with Dr. Waverly Ferrari for approach;  Surgeon: Ditty, Loura Halt, MD;  Location: [facility] OR;  Service: Neurosurgery;  Laterality: N/A;   APPLICATION OF ROBOTIC ASSISTANCE FOR SPINAL PROCEDURE  12/19/2016   Procedure: APPLICATION OF ROBOTIC ASSISTANCE FOR  SPINAL PROCEDURE;  Surgeon: Loura Halt Ditty, MD;  Location: [facility] OR;  Service: Neurosurgery;;   APPLICATION OF ROBOTIC ASSISTANCE FOR SPINAL PROCEDURE N/A 07/26/2017   Procedure: APPLICATION OF ROBOTIC ASSISTANCE FOR SPINAL PROCEDURE;  Surgeon: Ditty, Loura Halt, MD;  Location: [facility] OR;  Service: Neurosurgery;  Laterality: N/A;   APPLICATION OF ROBOTIC ASSISTANCE FOR SPINAL PROCEDURE N/A 09/02/2017   Procedure: APPLICATION OF ROBOTIC ASSISTANCE FOR SPINAL PROCEDURE;  Surgeon: Ditty, Loura Halt, MD;  Location: [facility] OR;  Service: Neurosurgery;  Laterality: N/A;   BACK SURGERY     BLADDER SUSPENSION  1991   tack   BRONCHIAL BIOPSY  09/15/2021   Procedure: BRONCHIAL BIOPSIES;  Surgeon: Josephine Igo, DO;  Location: [facility];  Service: Pulmonary;;   BRONCHIAL BIOPSY  05/04/2022   Procedure: BRONCHIAL BIOPSIES;  Surgeon: Josephine Igo, DO;  Location: [facility];  Service: Pulmonary;;   BRONCHIAL BRUSHINGS  09/15/2021   Procedure: BRONCHIAL BRUSHINGS;  Surgeon: Josephine Igo, DO;  Location: [facility];  Service: Pulmonary;;    BRONCHIAL NEEDLE ASPIRATION BIOPSY  09/15/2021   Procedure: BRONCHIAL NEEDLE ASPIRATION BIOPSIES;  Surgeon: Josephine Igo, DO;  Location: [facility];  Service: Pulmonary;;   BRONCHIAL NEEDLE ASPIRATION BIOPSY  05/04/2022   Procedure: BRONCHIAL NEEDLE ASPIRATION BIOPSIES;  Surgeon: Josephine Igo, DO;  Location: [facility];  Service: Pulmonary;;   BRONCHIAL WASHINGS  09/15/2021   Procedure: BRONCHIAL WASHINGS;  Surgeon: Josephine Igo, DO;  Location: [facility];  Service: Pulmonary;;   FIDUCIAL MARKER PLACEMENT  09/15/2021   Procedure: FIDUCIAL MARKER PLACEMENT;  Surgeon: Josephine Igo, DO;  Location: [facility];  Service: Pulmonary;;   INGUINAL HERNIA REPAIR Right 1991   INTERCOSTAL NERVE BLOCK Left 06/16/2022   Procedure: INTERCOSTAL NERVE BLOCK;  Surgeon: Corliss Skains, MD;  Location: [facility] OR;  Service: Thoracic;  Laterality: Left;   LUMBAR FUSION  2016; 2017; 07/26/2017   L4-5; L2-3; L5-S1   LYMPH NODE DISSECTION Left 06/16/2022   Procedure: LYMPH NODE DISSECTION;  Surgeon: Corliss Skains, MD;  Location: [facility] OR;  Service: Thoracic;  Laterality: Left;   POSTERIOR LUMBAR FUSION 4 WITH HARDWARE REMOVAL N/A 09/02/2017   Procedure: Lumbar three-four redo laminectomy; Repositioning of Left Sacral two screw; Extension of lumbar fusion to Thoracic twelve;  Surgeon: Ditty, Loura Halt, MD;  Location: [facility] OR;  Service: Neurosurgery;  Laterality: N/A;   TONSILLECTOMY AND ADENOIDECTOMY  1959   TUMOR EXCISION     WERTHIN'S TUMORS BOTH SIDES OF NECK   VAGINAL HYSTERECTOMY  1979   VIDEO BRONCHOSCOPY WITH RADIAL ENDOBRONCHIAL ULTRASOUND  09/15/2021   Procedure: VIDEO BRONCHOSCOPY WITH RADIAL ENDOBRONCHIAL ULTRASOUND;  Surgeon: Josephine Igo, DO;  Location: [facility];  Service: Pulmonary;;   VIDEO BRONCHOSCOPY WITH RADIAL ENDOBRONCHIAL ULTRASOUND  05/04/2022   Procedure: VIDEO BRONCHOSCOPY WITH RADIAL ENDOBRONCHIAL ULTRASOUND;  Surgeon: Josephine Igo, DO;  Location: [facility];   Service: Pulmonary;;    REVIEW OF SYSTEMS:  A comprehensive review of systems was negative.   PHYSICAL EXAMINATION: General appearance: alert, cooperative, and no distress Head: Normocephalic, without obvious abnormality, atraumatic Neck: no adenopathy, no JVD, supple, symmetrical, trachea midline, and thyroid not enlarged, symmetric, no tenderness/mass/nodules Lymph nodes: Cervical, supraclavicular, and axillary nodes normal. Resp: clear to auscultation bilaterally Back: symmetric, no curvature. ROM normal. No CVA tenderness. Cardio: regular rate and rhythm, S1, S2 normal, no murmur, click, rub or gallop GI: soft, non-tender; bowel sounds normal; no masses,  no organomegaly Extremities: extremities normal, atraumatic, no cyanosis  or edema  ECOG PERFORMANCE STATUS: 1 - Symptomatic but completely ambulatory  Blood pressure 127/66, pulse 66, temperature 98.2 F (36.8 C), temperature source Oral, resp. rate 17, weight 175 lb 4.8 oz (79.5 kg), SpO2 100%.  LABORATORY DATA: Lab Results  Component Value Date   WBC 4.2 08/26/2023   HGB 11.9 (L) 08/26/2023   HCT 37.1 08/26/2023   MCV 89.0 08/26/2023   PLT 204 08/26/2023      Chemistry      Component Value Date/Time   NA 139 08/26/2023 1553   K 4.5 08/26/2023 1553   CL 105 08/26/2023 1553   CO2 26 08/26/2023 1553   BUN 15 08/26/2023 1553   CREATININE 0.91 08/26/2023 1553   CREATININE 0.84 07/31/2021 1600      Component Value Date/Time   CALCIUM 9.2 08/26/2023 1553   ALKPHOS 82 08/26/2023 1553   AST 19 08/26/2023 1553   ALT 21 08/26/2023 1553   BILITOT 0.4 08/26/2023 1553       RADIOGRAPHIC STUDIES: CT CHEST ABDOMEN PELVIS W CONTRAST Result Date: 08/27/2023 CLINICAL DATA:  Lung cancer restaging, ongoing chemotherapy * Tracking Code: BO * EXAM: CT CHEST, ABDOMEN, AND PELVIS WITH CONTRAST TECHNIQUE: Multidetector CT imaging of the chest, abdomen and pelvis was performed following the standard protocol during bolus  administration of intravenous contrast. RADIATION DOSE REDUCTION: This exam was performed according to the departmental dose-optimization program which includes automated exposure control, adjustment of the mA and/or kV according to patient size and/or use of iterative reconstruction technique. CONTRAST:  75mL OMNIPAQUE IOHEXOL 300 MG/ML  SOLN COMPARISON:  CT chest, 02/24/2023 CT abdomen pelvis, 08/29/2017 FINDINGS: CT CHEST FINDINGS Cardiovascular: Aortic atherosclerosis. Normal heart size. No pericardial effusion. Mediastinum/Nodes: No enlarged mediastinal, hilar, or axillary lymph nodes. Thyroid gland, trachea, and esophagus demonstrate no significant findings. Lungs/Pleura: Unchanged postoperative appearance status post left lower lobectomy and posterior left upper lobe wedge resection. Minimal centrilobular emphysema. No pleural effusion or pneumothorax. Musculoskeletal: No chest wall abnormality. No acute osseous findings. CT ABDOMEN PELVIS FINDINGS Hepatobiliary: No solid liver abnormality is seen. Unchanged benign subcentimeter cyst or hemangioma in the central right lobe of the liver, requiring no further follow-up or characterization (series 2, image 49). Hepatic steatosis. Contracted gallbladder. No gallstones, gallbladder wall thickening, or biliary dilatation. Pancreas: Unremarkable. No pancreatic ductal dilatation or surrounding inflammatory changes. Spleen: Normal in size without significant abnormality. Adrenals/Urinary Tract: Adrenal glands are unremarkable. Kidneys are normal, without renal calculi, solid lesion, or hydronephrosis. Bladder is unremarkable. Stomach/Bowel: Stomach is within normal limits. Appendix appears normal. No evidence of bowel wall thickening, distention, or inflammatory changes. Sigmoid diverticulosis. Vascular/Lymphatic: Aortic atherosclerosis. No enlarged abdominal or pelvic lymph nodes. Reproductive: Status post hysterectomy. Other: No abdominal wall hernia or abnormality.  No ascites. Musculoskeletal: No acute osseous findings. Osteopenia. Extensive posterior thoracolumbar fusion. IMPRESSION: 1. Unchanged postoperative appearance status post left lower lobectomy and posterior left upper lobe wedge resection. 2. No evidence of recurrent or metastatic disease in the chest, abdomen, or pelvis. 3. Hepatic steatosis. 4. Sigmoid diverticulosis without evidence of acute diverticulitis. Aortic Atherosclerosis (ICD10-I70.0) and Emphysema (ICD10-J43.9). Electronically Signed   By: Jearld Lesch M.D.   On: 08/27/2023 14:33    ASSESSMENT AND PLAN: This is a very pleasant 72 years old white female with Stage IIb (T3, N0, M0)  non-small cell lung cancer, adenocarcinoma presented with large left lower lobe lung mass diagnosed in August 2023 status post left lower lobectomy with lymph node sampling under the care of Dr. Cliffton Asters on  June 16, 2022 with tumor size of 5.5 cm and visceropleural involvement. Molecular studies by foundation 1 showed no actionable mutation and she has negative PD-L1 expression. The patient started adjuvant systemic chemotherapy with carboplatin for AUC of 5 and Alimta 500 Mg/M2 status post 4 cycles.  Last dose was given October 11, 2022.  The patient has been on observation and she is feeling fine with no concerning complaints. She had repeat CT scan of the chest, abdomen and pelvis performed recently.  I personally and independently reviewed the scan and discussed the result with the patient today. Her scan showed no concerning findings for disease recurrence or metastasis.    Postoperative Status Following Left Lower Lobectomy Six months post left lower lobectomy for lung cancer. Reports significant improvement in pain, with occasional discomfort on deep breaths attributed to surgical changes. No evidence of recurrent or metastatic disease on recent scan. - Order chest CT in six months  Chemotherapy Management Undergoing chemotherapy with carboplatin and  pemetrexed (Alimta). Reports occasional nausea but no vomiting, diarrhea, or significant weight loss. Blood pressure well-controlled. Discussed risks and benefits of chemotherapy, including potential side effects such as nausea, vomiting, and fatigue. No new symptoms reported. - Continue current chemotherapy regimen - Monitor for side effects and manage symptomatically  Hepatic Steatosis Persistent hepatic steatosis noted on scan. No acute changes or complications. - Monitor hepatic steatosis with family doctor  Sigmoid Diverticulosis Sigmoid diverticulosis noted on scan without evidence of diverticulitis. Discussed that diverticulitis is the inflammation of diverticula and is more severe. - Monitor for symptoms of diverticulitis  General Health Maintenance General health maintenance discussed. No new issues reported. - Continue routine follow-up with family doctor  Follow-up - Schedule follow-up appointment in six months.   The patient was advised to call immediately if she has any concerning symptoms in the interval. The patient voices understanding of current disease status and treatment options and is in agreement with the current care plan.  All questions were answered. The patient knows to call the clinic with any problems, questions or concerns. We can certainly see the patient much sooner if necessary.  The total time spent in the appointment was 20 minutes.  Disclaimer: This note was dictated with voice recognition software. Similar sounding words can inadvertently be transcribed and may not be corrected upon review.

## 2023-09-11 ENCOUNTER — Other Ambulatory Visit: Payer: Self-pay | Admitting: Primary Care

## 2023-09-11 DIAGNOSIS — N3281 Overactive bladder: Secondary | ICD-10-CM

## 2023-10-09 ENCOUNTER — Other Ambulatory Visit: Payer: Self-pay | Admitting: Primary Care

## 2023-10-09 DIAGNOSIS — E785 Hyperlipidemia, unspecified: Secondary | ICD-10-CM

## 2023-11-24 ENCOUNTER — Other Ambulatory Visit: Payer: Self-pay | Admitting: Primary Care

## 2023-11-24 DIAGNOSIS — M62838 Other muscle spasm: Secondary | ICD-10-CM

## 2023-11-24 NOTE — Telephone Encounter (Unsigned)
 Copied from CRM [id number]. Topic: Clinical - Medication Refill >> Nov 24, 2023  3:26 PM Marica Otter wrote: Most Recent Primary Care Visit:  Provider: [facility]  Department: [facility]  Visit Type: LAB  Date: 07/12/2023  Medication: baclofen (LIORESAL) 10 MG tablet  Has the patient contacted their pharmacy? Yes, new pharmacy needs prescription (Agent: If no, request that the patient contact the pharmacy for the refill. If patient does not wish to contact the pharmacy document the reason why and proceed with request.) (Agent: If yes, when and what did the pharmacy advise?)  Is this the correct pharmacy for this prescription? Yes If no, delete pharmacy and type the correct one.  This is the patient's preferred pharmacy:  [facility] [address] [place], [place] - [address] [place] [address] Phone: [phone] Fax: [phone]   Has the prescription been filled recently? No  Is the patient out of the medication? Yes  Has the patient been seen for an appointment in the last year OR does the patient have an upcoming appointment? Yes  Can we respond through MyChart? Yes  Agent: Please be advised that Rx refills may take up to 3 business days. We ask that you follow-up with your pharmacy.

## 2023-11-25 NOTE — Telephone Encounter (Signed)
 Please call patient:  Has she switched pharmacies? I sent plenty of refills to [facility] pharmacy on 08/17/23.

## 2023-11-28 MED ORDER — BACLOFEN 10 MG PO TABS: 10.0000 mg | ORAL_TABLET | Freq: Three times a day (TID) | ORAL | 1 refills | Status: DC

## 2023-11-28 NOTE — Telephone Encounter (Signed)
 Called and spoke with patient, she states she is switching all of her medication to [facility] pharmacy, she is only due for baclofen at the moment. She will let us know when she is running low on others.

## 2023-12-05 DIAGNOSIS — B880 Other acariasis: Secondary | ICD-10-CM | POA: Diagnosis not present

## 2023-12-13 ENCOUNTER — Other Ambulatory Visit: Payer: Self-pay | Admitting: Primary Care

## 2023-12-13 DIAGNOSIS — E1165 Type 2 diabetes mellitus with hyperglycemia: Secondary | ICD-10-CM

## 2023-12-13 DIAGNOSIS — N3281 Overactive bladder: Secondary | ICD-10-CM

## 2023-12-13 NOTE — Telephone Encounter (Signed)
 Copied from CRM [id number]. Topic: Clinical - Medication Refill >> Dec 13, 2023  3:07 PM Pascal Lux wrote: Most Recent Primary Care Visit:  Provider: [facility]  Department: [facility]  Visit Type: LAB  Date: 07/12/2023  Medication: glipiZIDE (GLUCOTROL) 5 MG tablet [[id number]] oxybutynin (DITROPAN-XL) 5 MG 24 hr tablet [[id number]]   Has the patient contacted their pharmacy? No (Agent: If no, request that the patient contact the pharmacy for the refill. If patient does not wish to contact the pharmacy document the reason why and proceed with request.) (Agent: If yes, when and what did the pharmacy advise?)  Is this the correct pharmacy for this prescription? Yes If no, delete pharmacy and type the correct one.  This is the patient's preferred pharmacy:  [facility] [address] [place], [place] - [address] [place] [address] Phone: [phone] Fax: [phone]   Has the prescription been filled recently? No  Is the patient out of the medication? No  Has the patient been seen for an appointment in the last year OR does the patient have an upcoming appointment? Yes  Can we respond through MyChart? Yes  Agent: Please be advised that Rx refills may take up to 3 business days. We ask that you follow-up with your pharmacy.

## 2023-12-13 NOTE — Telephone Encounter (Signed)
 Last Fill: Glipizide: 08/25/23     Oxybutynin: 09/11/23  Last OV: 06/30/23 Next OV: 12/29/23  Routing to provider for review/authorization.

## 2023-12-15 ENCOUNTER — Telehealth: Payer: Self-pay | Admitting: Primary Care

## 2023-12-15 DIAGNOSIS — E1165 Type 2 diabetes mellitus with hyperglycemia: Secondary | ICD-10-CM

## 2023-12-15 DIAGNOSIS — N3281 Overactive bladder: Secondary | ICD-10-CM

## 2023-12-15 MED ORDER — GLIPIZIDE 5 MG PO TABS: ORAL_TABLET | ORAL | 0 refills | Status: DC

## 2023-12-15 MED ORDER — OXYBUTYNIN CHLORIDE ER 5 MG PO TB24: 5.0000 mg | ORAL_TABLET | Freq: Every day | ORAL | 1 refills | Status: DC

## 2023-12-15 NOTE — Telephone Encounter (Signed)
 Called and spoke with patient, she has switched pharmacies and is now using [facility] [address]. Needs refills sent there.

## 2023-12-15 NOTE — Telephone Encounter (Signed)
 Please call patient:  She has refills on file at her pharmacy for both medications. Have her contact her pharmacy.

## 2023-12-15 NOTE — Telephone Encounter (Signed)
 2nd patient request, routed to the provider on 12/13/23 in a separate refill encounter.  Copied from CRM [id number]. Topic: Clinical - Medication Refill >> Dec 15, 2023 11:41 AM Georgeanna Harrison H wrote: Most Recent Primary Care Visit:  Provider: [facility]  Department: [facility]  Visit Type: LAB  Date: 07/12/2023  Medication:  glipiZIDE (GLUCOTROL) 5 MG tablet  oxybutynin (DITROPAN-XL) 5 MG 24 hr tablet   Has the patient contacted their pharmacy? Yes (Agent: If no, request that the patient contact the pharmacy for the refill. If patient does not wish to contact the pharmacy document the reason why and proceed with request.) (Agent: If yes, when and what did the pharmacy advise?)  Is this the correct pharmacy for this prescription? Yes If no, delete pharmacy and type the correct one.  This is the patient's preferred pharmacy:  [facility] [address] - [place], [place] - [address] [place] [address] Phone: [phone] Fax: [phone]  [id number] [place], [place] - [address] AT [facility] [address] & [address] [place] [address] Phone: [phone] Fax: [phone]  [facility] [address] [place] [address] Phone: [phone] Fax: [phone]   Has the prescription been filled recently? Yes  Is the patient out of the medication? Yes  Has the patient been seen for an appointment in the last year OR does the patient have an upcoming appointment? Yes  Can we respond through MyChart? Yes  Agent: Please be advised that Rx refills may take up to 3 business days. We ask that you follow-up with your pharmacy.

## 2023-12-15 NOTE — Telephone Encounter (Signed)
 Refills sent to pharmacy.

## 2023-12-26 ENCOUNTER — Other Ambulatory Visit: Payer: Self-pay | Admitting: Primary Care

## 2023-12-26 DIAGNOSIS — E785 Hyperlipidemia, unspecified: Secondary | ICD-10-CM

## 2023-12-26 NOTE — Telephone Encounter (Signed)
 Copied from CRM [id number]. Topic: Clinical - Medication Refill >> Dec 26, 2023 10:50 AM Chuck Crater wrote: Most Recent Primary Care Visit:  Provider: [facility]  Department: [facility]  Visit Type: LAB  Date: 07/12/2023  Medication: atorvastatin (LIPITOR) 40 MG tablet  Has the patient contacted their pharmacy? No (Agent: If no, request that the patient contact the pharmacy for the refill. If patient does not wish to contact the pharmacy document the reason why and proceed with request.)  (Agent: If yes, when and what did the pharmacy advise?) new prescription   Is this the correct pharmacy for this prescription? Yes If no, delete pharmacy and type the correct one.  This is the patient's preferred pharmacy:  [facility] [address] [place], [place] - [address] [place] [address] Phone: [phone] Fax: [phone]  Has the prescription been filled recently? No 90 day supply  Is the patient out of the medication? No 1 week left   Has the patient been seen for an appointment in the last year OR does the patient have an upcoming appointment? Yes  Can we respond through MyChart? Yes  Agent: Please be advised that Rx refills may take up to 3 business days. We ask that you follow-up with your pharmacy.

## 2023-12-28 ENCOUNTER — Other Ambulatory Visit: Payer: Self-pay | Admitting: Primary Care

## 2023-12-28 DIAGNOSIS — Z0000 Encounter for general adult medical examination without abnormal findings: Secondary | ICD-10-CM

## 2023-12-29 ENCOUNTER — Ambulatory Visit
Admission: RE | Admit: 2023-12-29 | Discharge: 2023-12-29 | Disposition: A | Discharge status: Home / Self Care | Source: Ambulatory Visit | Attending: Primary Care | Admitting: Primary Care

## 2023-12-29 ENCOUNTER — Other Ambulatory Visit: Payer: Self-pay | Admitting: Primary Care

## 2023-12-29 ENCOUNTER — Ambulatory Visit (INDEPENDENT_AMBULATORY_CARE_PROVIDER_SITE_OTHER): Payer: PPO | Admitting: Primary Care

## 2023-12-29 ENCOUNTER — Encounter: Payer: Self-pay | Admitting: Primary Care

## 2023-12-29 VITALS — BP 128/76 | HR 76 | Temp 97.7°F | Ht 68.0 in | Wt 176.0 lb

## 2023-12-29 DIAGNOSIS — M47812 Spondylosis without myelopathy or radiculopathy, cervical region: Secondary | ICD-10-CM | POA: Diagnosis not present

## 2023-12-29 DIAGNOSIS — R202 Paresthesia of skin: Secondary | ICD-10-CM

## 2023-12-29 DIAGNOSIS — M79601 Pain in right arm: Secondary | ICD-10-CM | POA: Diagnosis not present

## 2023-12-29 DIAGNOSIS — E1165 Type 2 diabetes mellitus with hyperglycemia: Secondary | ICD-10-CM | POA: Diagnosis not present

## 2023-12-29 DIAGNOSIS — M4802 Spinal stenosis, cervical region: Secondary | ICD-10-CM | POA: Diagnosis not present

## 2023-12-29 DIAGNOSIS — R200 Anesthesia of skin: Secondary | ICD-10-CM

## 2023-12-29 DIAGNOSIS — E785 Hyperlipidemia, unspecified: Secondary | ICD-10-CM

## 2023-12-29 DIAGNOSIS — M542 Cervicalgia: Secondary | ICD-10-CM | POA: Diagnosis not present

## 2023-12-29 DIAGNOSIS — Z7984 Long term (current) use of oral hypoglycemic drugs: Secondary | ICD-10-CM | POA: Diagnosis not present

## 2023-12-29 DIAGNOSIS — Z981 Arthrodesis status: Secondary | ICD-10-CM | POA: Diagnosis not present

## 2023-12-29 HISTORY — DX: Anesthesia of skin: R20.0

## 2023-12-29 LAB — POCT GLYCOSYLATED HEMOGLOBIN (HGB A1C): Hemoglobin A1C: 6.4 % — AB (ref 4.0–5.6)

## 2023-12-29 MED ORDER — ATORVASTATIN CALCIUM 40 MG PO TABS: 40.0000 mg | ORAL_TABLET | Freq: Every day | ORAL | 1 refills | Status: DC

## 2023-12-29 MED ORDER — PREDNISONE 20 MG PO TABS: ORAL_TABLET | ORAL | 0 refills | Status: DC

## 2023-12-29 NOTE — Progress Notes (Signed)
 Subjective:    Patient ID: Megan [name], female    DOB: 03/30/1951, 73 y.o.   MRN: [id number]  Arm Pain  Associated symptoms include numbness. Pertinent negatives include no chest pain.    Megan [name] is a very pleasant 73 y.o. female with a history of type 2 diabetes, hypertension, type 2 diabetes, hyperlipidemia who presents today for follow up of diabetes and to discuss extremity pain.  1) Type 2 Diabetes:   Current medications include: Glipizide 5 mg twice daily, metformin 500 mg BID.   She is checking her blood glucose 1-2 times daily and is getting readings of:  AM fasting: 120s 2 hours after meals: 140-150  No recent hypoglycemia episodes.   Last A1C: 6.3 in October 2024, 6.4 today Last Eye Exam: UTD, August 2024 Last Foot Exam: Due today Pneumonia Vaccination: Prevnar 20 in 2022 Urine Microalbumin: UTD Statin: atorvastatin   Dietary changes since last visit: Carb heavy in the mornings (bagel, muffin, eggs), salads for lunch, protein and veggies for dinner. Sugar free drinks, water with flavoring.   Exercise: Several times weekly   2) Upper Extremity Pain: Chronic for the last 1 week, worse over the last 2-3 weeks. Her pain is located to the right lateal neck with radiation down to her fingers. She will then experience pain to the humeral region, "feels like a knot" to the biceps region. She describes her pain down the arm as tingling.    Her pain is worse with rotating her neck to the right, reaching backwards, playing with her phone. She denies decreased strength, numbness. She's taken Ibuprofen and Tylenol without improvement.     Review of Systems  Eyes:  Negative for visual disturbance.  Respiratory:  Negative for shortness of breath.   Cardiovascular:  Negative for chest pain.  Endocrine: Negative for polydipsia, polyphagia and polyuria.  Musculoskeletal:  Positive for myalgias and neck pain.  Neurological:  Positive for numbness.          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   Constipation due to pain medication    Debility    Depression    Dyspnea    W/ PHYS CONDITION    Emphysema of lung (HCC)    Foul smelling urine 07/22/2020   GERD (gastroesophageal reflux disease)    History of bronchitis    "not since I quit smoking" (07/26/2017)   History of kidney stones    History of shingles    Hyperlipidemia    takes Fish Oil daily   Hypertension    Hypoalbuminemia due to protein-calorie malnutrition (HCC)    L3 vertebral fracture (HCC) 09/01/2017   Lung cancer (HCC) 2023   Lung nodule 04/21/2022   Lung nodule seen on imaging study 08/14/2021   Added automatically from request for surgery [id number]   Myelopathy (HCC) 12/21/2016   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   Neuromuscular disorder (HCC)    tingling toes   Paraparesis (HCC) 12/15/2016   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   Pneumonia 2009   PONV (postoperative nausea and vomiting)    Radiculopathy 07/28/2017   Restless leg    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   Type 2 diabetes mellitus (HCC)    Uterine  cancer (HCC) 1979   S/P hysterectomy   Weakness    numbness and tingling in both feet r/t back    Social History   Socioeconomic History   Marital status: Married    Spouse name: Marita [place]   Number of children: 1   Years of education: 12   Highest education level: 12th grade  Occupational History   Not on file  Tobacco Use   Smoking status: Former    Current packs/day: 0.00    Average packs/day: 1.5 packs/day for 35.0 years (52.5 ttl pk-yrs)    Types: Cigarettes    Start date: 05/14/1971    Quit date: 05/13/2006    Years since quitting: 17.6    Smokeless tobacco: Never  Vaping Use   Vaping status: Never Used  Substance and Sexual Activity   Alcohol use: Yes    Alcohol/week: 2.0 standard drinks of alcohol    Types: 2 Glasses of wine per week   Drug use: No   Sexual activity: Not Currently    Birth control/protection: Surgical  Other Topics Concern   Not on file  Social History Narrative   Lives w/ husband   Married.   1 child, 1 grandchildren.   Retired. Once worked for [employer].   Enjoys reading.    Social Drivers of Corporate investment banker Strain: Low Risk  (12/25/2023)   Overall Financial Resource Strain (CARDIA)    Difficulty of Paying Living Expenses: Not hard at all  Food Insecurity: No Food Insecurity (12/25/2023)   Hunger Vital Sign    Worried About Running Out of Food in the Last Year: Never true    Ran Out of Food in the Last Year: Never true  Transportation Needs: No Transportation Needs (12/25/2023)   PRAPARE - Administrator, Civil Service (Medical): No    Lack of Transportation (Non-Medical): No  Physical Activity: Unknown (12/25/2023)   Exercise Vital Sign    Days of Exercise per Week: Patient declined    Minutes of Exercise per Session: 10 min  Stress: Stress Concern Present (12/25/2023)   [employer] of Occupational Health - Occupational Stress Questionnaire    Feeling of Stress : To some extent  Social Connections: Moderately Isolated (12/25/2023)   Social Connection and Isolation Panel [NHANES]    Frequency of Communication with Friends and Family: More than three times a week    Frequency of Social Gatherings with Friends and Family: Twice a week    Attends Religious Services: Never    Database administrator or Organizations: No    Attends Banker Meetings: Never    Marital Status: Married  Catering manager Violence: Not At Risk (01/04/2023)   Humiliation, Afraid, Rape, and Kick questionnaire    Fear of Current or Ex-Partner: No    Emotionally Abused: No     Physically Abused: No    Sexually Abused: No    Past Surgical History:  Procedure Laterality Date   ABDOMINAL EXPOSURE N/A 07/26/2017   Procedure: ABDOMINAL EXPOSURE;  Surgeon: Chuck Hint, MD;  Location: [facility] OR;  Service: Vascular;  Laterality: N/A;   ANTERIOR CERVICAL DECOMP/DISCECTOMY FUSION  2001   ANTERIOR LUMBAR FUSION N/A 07/26/2017   Procedure: Lumbar five-Sacral one Anterior lumbar interbody fusion with Dr. Waverly Ferrari for approach;  Surgeon: Ditty, Loura Halt, MD;  Location: [facility] OR;  Service: Neurosurgery;  Laterality: N/A;   APPLICATION OF ROBOTIC ASSISTANCE FOR SPINAL PROCEDURE  12/19/2016   Procedure: APPLICATION OF ROBOTIC  ASSISTANCE FOR SPINAL PROCEDURE;  Surgeon: Raelene Bullocks Ditty, MD;  Location: [facility] OR;  Service: Neurosurgery;;   APPLICATION OF ROBOTIC ASSISTANCE FOR SPINAL PROCEDURE N/A 07/26/2017   Procedure: APPLICATION OF ROBOTIC ASSISTANCE FOR SPINAL PROCEDURE;  Surgeon: Ditty, Raelene Bullocks, MD;  Location: [facility] OR;  Service: Neurosurgery;  Laterality: N/A;   APPLICATION OF ROBOTIC ASSISTANCE FOR SPINAL PROCEDURE N/A 09/02/2017   Procedure: APPLICATION OF ROBOTIC ASSISTANCE FOR SPINAL PROCEDURE;  Surgeon: Ditty, Raelene Bullocks, MD;  Location: [facility] OR;  Service: Neurosurgery;  Laterality: N/A;   BACK SURGERY     BLADDER SUSPENSION  1991   tack   BRONCHIAL BIOPSY  09/15/2021   Procedure: BRONCHIAL BIOPSIES;  Surgeon: Prudy Brownie, DO;  Location: [facility];  Service: Pulmonary;;   BRONCHIAL BIOPSY  05/04/2022   Procedure: BRONCHIAL BIOPSIES;  Surgeon: Prudy Brownie, DO;  Location: [facility];  Service: Pulmonary;;   BRONCHIAL BRUSHINGS  09/15/2021   Procedure: BRONCHIAL BRUSHINGS;  Surgeon: Prudy Brownie, DO;  Location: [facility];  Service: Pulmonary;;   BRONCHIAL NEEDLE ASPIRATION BIOPSY  09/15/2021   Procedure: BRONCHIAL NEEDLE ASPIRATION BIOPSIES;  Surgeon: Prudy Brownie, DO;  Location: [facility];  Service: Pulmonary;;   BRONCHIAL  NEEDLE ASPIRATION BIOPSY  05/04/2022   Procedure: BRONCHIAL NEEDLE ASPIRATION BIOPSIES;  Surgeon: Prudy Brownie, DO;  Location: [facility];  Service: Pulmonary;;   BRONCHIAL WASHINGS  09/15/2021   Procedure: BRONCHIAL WASHINGS;  Surgeon: Prudy Brownie, DO;  Location: [facility];  Service: Pulmonary;;   FIDUCIAL MARKER PLACEMENT  09/15/2021   Procedure: FIDUCIAL MARKER PLACEMENT;  Surgeon: Prudy Brownie, DO;  Location: [facility];  Service: Pulmonary;;   INGUINAL HERNIA REPAIR Right 1991   INTERCOSTAL NERVE BLOCK Left 06/16/2022   Procedure: INTERCOSTAL NERVE BLOCK;  Surgeon: Hilarie Lovely, MD;  Location: [facility] OR;  Service: Thoracic;  Laterality: Left;   LUMBAR FUSION  2016; 2017; 07/26/2017   L4-5; L2-3; L5-S1   LYMPH NODE DISSECTION Left 06/16/2022   Procedure: LYMPH NODE DISSECTION;  Surgeon: Hilarie Lovely, MD;  Location: [facility] OR;  Service: Thoracic;  Laterality: Left;   POSTERIOR LUMBAR FUSION 4 WITH HARDWARE REMOVAL N/A 09/02/2017   Procedure: Lumbar three-four redo laminectomy; Repositioning of Left Sacral two screw; Extension of lumbar fusion to Thoracic twelve;  Surgeon: Ditty, Raelene Bullocks, MD;  Location: [facility] OR;  Service: Neurosurgery;  Laterality: N/A;   TONSILLECTOMY AND ADENOIDECTOMY  1959   TUMOR EXCISION     WERTHIN'S TUMORS BOTH SIDES OF NECK   VAGINAL HYSTERECTOMY  1979   VIDEO BRONCHOSCOPY WITH RADIAL ENDOBRONCHIAL ULTRASOUND  09/15/2021   Procedure: VIDEO BRONCHOSCOPY WITH RADIAL ENDOBRONCHIAL ULTRASOUND;  Surgeon: Prudy Brownie, DO;  Location: [facility];  Service: Pulmonary;;   VIDEO BRONCHOSCOPY WITH RADIAL ENDOBRONCHIAL ULTRASOUND  05/04/2022   Procedure: VIDEO BRONCHOSCOPY WITH RADIAL ENDOBRONCHIAL ULTRASOUND;  Surgeon: Prudy Brownie, DO;  Location: [facility];  Service: Pulmonary;;    Family History  Problem Relation Age of Onset   Diabetes Mother    Dementia Mother    Cirrhosis Mother        Non alcoholic   COPD Father    Diabetes  Brother     Allergies  Allergen Reactions   Shellfish Allergy Anaphylaxis, Swelling and Other (See Comments)    Tongue swells   Oxycodone Nausea Only   Penicillins Rash and Other (See Comments)    Has patient had a PCN reaction causing immediate rash, facial/tongue/throat swelling, SOB or lightheadedness with hypotension:  Yes Has patient had a PCN reaction causing severe rash involving mucus membranes or skin necrosis: No Has patient had a PCN reaction that required hospitalization No Has patient had a PCN reaction occurring within the last 10 years: No If all of the above answers are "NO", then may proceed with Cephalosporin use.    Current Outpatient Medications on File Prior to Visit  Medication Sig Dispense Refill   acetaminophen (TYLENOL) 500 MG tablet Take 500 mg by mouth every 8 (eight) hours as needed (for pain).     amLODipine (NORVASC) 10 MG tablet TAKE 1 TABLET(10 MG) BY MOUTH DAILY FOR BLOOD PRESSURE 90 tablet 3   atorvastatin (LIPITOR) 40 MG tablet TAKE 1 TABLET(40 MG) BY MOUTH DAILY FOR CHOLESTEROL 90 tablet 1   baclofen (LIORESAL) 10 MG tablet Take 1 tablet (10 mg total) by mouth 3 (three) times daily. For muscle spasms. 270 each 1   Biotin 5000 MCG CAPS Take 5,000 mcg by mouth every morning.      cetirizine (ZYRTEC) 10 MG tablet Take 1 tablet (10 mg total) by mouth daily. For allergies 90 tablet 0   Cholecalciferol (VITAMIN D) 50 MCG (2000 UT) tablet Take 2,000 Units by mouth daily.     Coenzyme Q10 (COQ10) 100 MG CAPS Take 100 mg by mouth every evening.     esomeprazole (NEXIUM 24HR) 20 MG capsule Take 1 capsule (20 mg total) by mouth daily at 12 noon. 90 capsule 1   fluocinonide cream (LIDEX) 0.05 % Apply 1 Application topically 2 (two) times daily as needed (irritation).     gabapentin (NEURONTIN) 300 MG capsule Take 1 capsule (300 mg total) by mouth 2 (two) times daily. For back pain 180 capsule 2   glipiZIDE (GLUCOTROL) 5 MG tablet TAKE 1 TABLET(5 MG) BY MOUTH TWICE  DAILY BEFORE A MEAL FOR DIABETES 180 tablet 0   lisinopril (ZESTRIL) 20 MG tablet Take 1 tablet (20 mg total) by mouth daily. for blood pressure. 90 tablet 2   metFORMIN (GLUCOPHAGE) 500 MG tablet Take 1 tablet (500 mg total) by mouth 2 (two) times daily with a meal. for diabetes. 180 tablet 1   nystatin-triamcinolone ointment (MYCOLOG) Apply 1 Application topically 2 (two) times daily as needed (irritation).     oxybutynin (DITROPAN-XL) 5 MG 24 hr tablet Take 1 tablet (5 mg total) by mouth at bedtime. For overactive bladder 90 tablet 1   oxybutynin (OXYTROL) 3.9 MG/24HR Place 1 patch onto the skin See admin instructions. Change every 5 days     Polyethyl Glycol-Propyl Glycol (LUBRICANT EYE DROPS) 0.4-0.3 % SOLN Place 1-2 drops into both eyes 2 (two) times daily.     senna-docusate (SENOKOT-S) 8.6-50 MG tablet Take 2 tablets by mouth 2 (two) times daily. (Patient taking differently: Take 1 tablet by mouth every other day.)     No current facility-administered medications on file prior to visit.    BP 128/76   Pulse 76   Temp 97.7 F (36.5 C) (Temporal)   Ht 5\' 8"  (1.727 m)   Wt 176 lb (79.8 kg)   SpO2 99%   BMI 26.76 kg/m  Objective:   Physical Exam Cardiovascular:     Rate and Rhythm: Normal rate and regular rhythm.  Pulmonary:     Effort: Pulmonary effort is normal.     Breath sounds: Normal breath sounds.  Musculoskeletal:     Right upper arm: No swelling, deformity or tenderness.       Arms:  Cervical back: Neck supple.     Comments: Muscle tightness noted to right humeral region. 5/5 strength to bilateral upper extremities.   Skin:    General: Skin is warm and dry.  Neurological:     Mental Status: She is alert and oriented to person, place, and time.  Psychiatric:        Mood and Affect: Mood normal.           Assessment & Plan:  Type 2 diabetes mellitus with hyperglycemia, without long-term current use of insulin (HCC) Assessment & Plan: Controlled, A1c  today 6.4%  Reviewed hypoglycemia awareness and treatment  Foot exam: completed today  Microalbumin/creatine ratio, urine: UTD  Continue current medications of: glipizide 5mg  twice daily, metformin 500mg  twice daily.   Follow up in 6 months for annual physical and complete set of lab work.   I evaluated patient, was consulted regarding treatment, and agree with assessment and plan per Jenna Elkins, MSN, FNP student.   Aneta Bar, NP-C   Orders: -     POCT glycosylated hemoglobin (Hb A1C)  Numbness and tingling of right arm Assessment & Plan: Acute on chronic per HPI, deteriorated  No alarm signs per PE. Differentials: cervical radiculopathy   Cervical neck xray ordered - results pending Prednisone taper ordered - 40mg  daily x4 days followed by 20mg  daily x4 days  Offered referral to physical therapy; declines today, will consider in future.   She will update on symptoms via MyChart   I evaluated patient, was consulted regarding treatment, and agree with assessment and plan per Jenna Elkins, MSN, FNP student.   Aneta Bar, NP-C   Orders: -     DG Cervical Spine Complete -     predniSONE; Take 2 tablets (40mg ) by mouth once daily for 4 days. Then take 1 tablet (20mg ) by mouth once daily for 4 days.  Dispense: 12 tablet; Refill: 0  Right arm pain -     DG Cervical Spine Complete        Gabriel John, NP

## 2023-12-29 NOTE — Patient Instructions (Addendum)
 Medications:  Continue current therapy   OK to take extra baclofen as needed for spasms  Prednisone  - take 2 - 20mg  tablets for 4 days - take 1 - 20mg  tablet for 4 days   Imaging:  Cervical Spine X-ray -- will follow up with results   Follow up:  6 months   Consider: PT referral

## 2023-12-29 NOTE — Assessment & Plan Note (Addendum)
 Acute on chronic per HPI, deteriorated  No alarm signs per PE. Differentials: cervical radiculopathy   Cervical neck xray ordered - results pending Prednisone taper ordered - 40mg  daily x4 days followed by 20mg  daily x4 days  Offered referral to physical therapy; declines today, will consider in future.   She will update on symptoms via MyChart   I evaluated patient, was consulted regarding treatment, and agree with assessment and plan per Ailyn Gladd, MSN, FNP student.   Aneta Bar, NP-C

## 2023-12-29 NOTE — Assessment & Plan Note (Addendum)
 Controlled, A1c today 6.4%  Reviewed hypoglycemia awareness and treatment  Foot exam: completed today  Microalbumin/creatine ratio, urine: UTD  Continue current medications of: glipizide 5mg  twice daily, metformin 500mg  twice daily.   Follow up in 6 months for annual physical and complete set of lab work.   I evaluated patient, was consulted regarding treatment, and agree with assessment and plan per Essica Kiker, MSN, FNP student.   Aneta Bar, NP-C

## 2023-12-29 NOTE — Progress Notes (Signed)
 Established Patient Office Visit  Subjective   Patient ID: Megan [name], female    DOB: 16-Oct-1950  Age: 73 y.o. MRN: [id number]  Chief Complaint  Patient presents with  . Medical Management of Chronic Issues  . Arm Pain    Intermittent right arm pain that has become more consistent. She describes pain as burning, and notices a knot on her upper arm when pain is present     HPI: Megan [name] is a 73 year old female with history of hypertension, type 2 diabetes with hyperglycemia and peripheral neuropathy, hyperlipidemia, adenocarcinoma of lung s/p lobectomy who presents today for 6 month diabetes follow up and to discuss intermittent right arm pain.   Type 2 diabetes mellitus  Current medications include:  Glipizide 5mg  twice daily Metformin 500mg  twice daily  She is checking her blood glucose 1-2 times daily and is getting readings of AM fasting - 120s Post-prandial - 140s-150s   Hypoglycemia - no recent episodes   Denies polyuria, polydipsia, polyphagia. Denies chest pain, palpitations, shortness of breath. Denies vision changes. Notes right leg numbness related to thoracic spine surgery in 2018.   Last A1C: 6.3% (06/2023) Last Eye Exam: 04/2023 Last Foot Exam: due Pneumonia Vaccination: 2021 Urine Microalbumin: completed 06/2023 Statin: atorvastatin 40mg   Diet: Breakfast: bagel, mini doughnuts, english muffins, eggs Lunch: salads, sandwich, oatmeal  Dinner: variable proteins, vegetables Beverages: low-sugar cranberry juice, water with sugar-free flavoring, diet soda   Exercise: ellipse machine  Right arm pain  Onset >1 year ago, symptoms worsened in last 4-6 weeks. No trauma or injury. No prior eval. Described as "zingers" or tingling that radiates from neck down right arm with associated pain and knot on bicep muscle. Pain 7 of 10 at worst, not relieved with PRN advil or tylenol arthritis strength. Episodes occur several times daily, lasting 10-15 minutes. There is  occasional numbness that is positional, such as when lying on arm to sleep. Episodes elicited by: lateral head rotation to right, changes in right arm position, reaching behind but not upward, playing games on phone. There is no change in strength to RUE.  Review of Systems  Eyes:  Negative for blurred vision and double vision.  Respiratory:  Negative for shortness of breath.   Cardiovascular:  Positive for leg swelling. Negative for chest pain and palpitations.       Ankle edema  Gastrointestinal:  Positive for nausea. Negative for constipation, diarrhea and vomiting.  Genitourinary:  Negative for dysuria and frequency.  Musculoskeletal:  Positive for myalgias.  Neurological:  Positive for tingling and sensory change.      Objective:     BP 128/76   Pulse 76   Temp 97.7 F (36.5 C) (Temporal)   Ht 5\' 8"  (1.727 m)   Wt 79.8 kg   SpO2 99%   BMI 26.76 kg/m    Physical Exam Constitutional:      Appearance: Normal appearance.  Cardiovascular:     Rate and Rhythm: Normal rate and regular rhythm.     Pulses:          Radial pulses are 2+ on the right side and 2+ on the left side.       Dorsalis pedis pulses are 1+ on the right side and 1+ on the left side.       Posterior tibial pulses are 1+ on the right side and 1+ on the left side.     Comments: Bilateral ankle edema Pulmonary:     Effort: Pulmonary effort is normal.  No respiratory distress.     Breath sounds: Normal breath sounds.  Musculoskeletal:     Right shoulder: No swelling or deformity. Normal range of motion. Normal strength.     Left shoulder: No swelling or deformity. Normal range of motion. Normal strength.     Right upper arm: No swelling, deformity or tenderness.     Left upper arm: No swelling, deformity or tenderness.     Right hand: No swelling or tenderness. Normal range of motion. Normal strength.     Left hand: No swelling or tenderness. Normal range of motion. Normal strength.     Cervical back:  Normal range of motion. No rigidity or tenderness.     Right lower leg: Edema present.     Left lower leg: Edema present.     Comments: Normal ROM Strength 5/5 in bilateral UE Empty Can - elicited pulling sensation in right bicep   Neurological:     Mental Status: She is alert and oriented to person, place, and time.     Results for orders placed or performed in visit on 12/29/23  POCT glycosylated hemoglobin (Hb A1C)  Result Value Ref Range   Hemoglobin A1C 6.4 (A) 4.0 - 5.6 %   HbA1c POC (<> result, manual entry)     HbA1c, POC (prediabetic range)     HbA1c, POC (controlled diabetic range)        The 10-year ASCVD risk score (Arnett DK, et al., 2019) is: 21.8%    Assessment & Plan:   Problem List Items Addressed This Visit       Endocrine   Type 2 diabetes mellitus with hyperglycemia (HCC) - Primary   Controlled, A1c today 6.4%  Reviewed hypoglycemia awareness and treatment  Foot exam: completed today  Microalbumin/creatine ratio, urine: UTD  Continue current medications of: glipizide 5mg  twice daily, metformin 500mg  twice daily.   Follow up in 6 months for annual physical and complete set of lab work.       Relevant Orders   POCT glycosylated hemoglobin (Hb A1C) (Completed)     Other   Numbness and tingling of right arm   Acute on chronic per HPI, deteriorated  No alarm signs per PE, no trauma Differentials: cervical radiculopathy   Cervical neck xray ordered - results pending Prednisone taper ordered - 40mg  daily x4 days followed by 20mg  daily x4 days  Offered referral to physical therapy; declines today, will consider in future.   She will update on symptoms via MyChart       Relevant Medications   predniSONE (DELTASONE) 20 MG tablet   Other Relevant Orders   DG Cervical Spine Complete   Other Visit Diagnoses       Right arm pain       Relevant Orders   DG Cervical Spine Complete      Follow up in 6 months for annual physical    Francesco Inks, RN

## 2023-12-29 NOTE — Telephone Encounter (Unsigned)
 Copied from CRM [id number]. Topic: Clinical - Medication Refill >> Dec 29, 2023 11:29 AM Orien Bird wrote: Most Recent Primary Care Visit:  Provider: [facility]  Department: [facility]  Visit Type: LAB  Date: 07/12/2023  Medication: atorvastatin (LIPITOR) 40 MG tablet  Has the patient contacted their pharmacy? Yes (Agent: If no, request that the patient contact the pharmacy for the refill. If patient does not wish to contact the pharmacy document the reason why and proceed with request.) (Agent: If yes, when and what did the pharmacy advise?)  Is this the correct pharmacy for this prescription? Yes If no, delete pharmacy and type the correct one.  This is the patient's preferred pharmacy:  [facility] [address] [place], [place] - [address] [place] [address] Phone: [phone] Fax: [phone]  Has the prescription been filled recently? No  Is the patient out of the medication? Yes  Has the patient been seen for an appointment in the last year OR does the patient have an upcoming appointment? Yes  Can we respond through MyChart? Yes  Agent: Please be advised that Rx refills may take up to 3 business days. We ask that you follow-up with your pharmacy.

## 2024-01-05 ENCOUNTER — Ambulatory Visit: Payer: PPO

## 2024-01-05 VITALS — Ht 68.0 in | Wt 176.0 lb

## 2024-01-05 DIAGNOSIS — Z0000 Encounter for general adult medical examination without abnormal findings: Secondary | ICD-10-CM

## 2024-01-05 NOTE — Progress Notes (Signed)
 Subjective:   Megan [name] is a 73 y.o. who presents for a Medicare Wellness preventive visit.  Visit Complete: Virtual I connected with  Megan [name] on 01/05/24 by a audio enabled telemedicine application and verified that I am speaking with the correct person using two identifiers.  Patient Location: Home  Provider Location: Office/Clinic  I discussed the limitations of evaluation and management by telemedicine. The patient expressed understanding and agreed to proceed.  Vital Signs: Because this visit was a virtual/telehealth visit, some criteria may be missing or patient reported. Any vitals not documented were not able to be obtained and vitals that have been documented are patient reported.  VideoDeclined- This patient declined Librarian, academic. Therefore the visit was completed with audio only.  Persons Participating in Visit: Patient.  AWV Questionnaire: Yes: Patient Medicare AWV questionnaire was completed by the patient on 01/01/24; I have confirmed that all information answered by patient is correct and no changes since this date.  Cardiac Risk Factors include: advanced age (>33men, >89 women);diabetes mellitus;dyslipidemia;hypertension;sedentary lifestyle     Objective:    Today's Vitals   01/05/24 1500  Weight: 176 lb (79.8 kg)  Height: 5\' 8"  (1.727 m)   Body mass index is 26.76 kg/m.     01/05/2024    3:10 PM 01/04/2023   11:17 AM 09/20/2022   10:54 AM 08/14/2022    1:37 PM 07/20/2022    3:10 PM 06/16/2022    3:00 PM 06/16/2022    6:51 AM  Advanced Directives  Does Patient Have a Medical Advance Directive? No No No No No No No  Would patient like information on creating a medical advance directive?  No - Patient declined No - Patient declined  No - Patient declined No - Patient declined No - Patient declined    Current Medications (verified) Outpatient Encounter Medications as of 01/05/2024  Medication Sig   acetaminophen   (TYLENOL ) 500 MG tablet Take 500 mg by mouth every 8 (eight) hours as needed (for pain).   amLODipine  (NORVASC ) 10 MG tablet TAKE 1 TABLET(10 MG) BY MOUTH DAILY FOR BLOOD PRESSURE   atorvastatin  (LIPITOR) 40 MG tablet Take 1 tablet (40 mg total) by mouth daily. for cholesterol.   baclofen  (LIORESAL ) 10 MG tablet Take 1 tablet (10 mg total) by mouth 3 (three) times daily. For muscle spasms.   Biotin  5000 MCG CAPS Take 5,000 mcg by mouth every morning.    cetirizine  (ZYRTEC ) 10 MG tablet Take 1 tablet (10 mg total) by mouth daily. For allergies   Cholecalciferol  (VITAMIN D ) 50 MCG (2000 UT) tablet Take 2,000 Units by mouth daily.   Coenzyme Q10 (COQ10) 100 MG CAPS Take 100 mg by mouth every evening.   esomeprazole  (NEXIUM  24HR) 20 MG capsule Take 1 capsule (20 mg total) by mouth daily at 12 noon.   gabapentin  (NEURONTIN ) 300 MG capsule Take 1 capsule (300 mg total) by mouth 2 (two) times daily. For back pain   glipiZIDE  (GLUCOTROL ) 5 MG tablet TAKE 1 TABLET(5 MG) BY MOUTH TWICE DAILY BEFORE A MEAL FOR DIABETES   lisinopril  (ZESTRIL ) 20 MG tablet Take 1 tablet (20 mg total) by mouth daily. for blood pressure.   metFORMIN  (GLUCOPHAGE ) 500 MG tablet Take 1 tablet (500 mg total) by mouth 2 (two) times daily with a meal. for diabetes.   nystatin-triamcinolone ointment (MYCOLOG) Apply 1 Application topically 2 (two) times daily as needed (irritation).   oxybutynin  (DITROPAN -XL) 5 MG 24 hr tablet Take 1  tablet (5 mg total) by mouth at bedtime. For overactive bladder   oxybutynin  (OXYTROL ) 3.9 MG/24HR Place 1 patch onto the skin See admin instructions. Change every 5 days   Polyethyl Glycol-Propyl Glycol (LUBRICANT EYE DROPS) 0.4-0.3 % SOLN Place 1-2 drops into both eyes 2 (two) times daily.   predniSONE  (DELTASONE ) 20 MG tablet Take 2 tablets (40mg ) by mouth once daily for 4 days. Then take 1 tablet (20mg ) by mouth once daily for 4 days.   senna-docusate (SENOKOT-S) 8.6-50 MG tablet Take 2 tablets by mouth  2 (two) times daily. (Patient taking differently: Take 1 tablet by mouth every other day.)   fluocinonide cream (LIDEX) 0.05 % Apply 1 Application topically 2 (two) times daily as needed (irritation).   No facility-administered encounter medications on file as of 01/05/2024.    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   Constipation due to pain medication    Debility    Depression    Dyspnea    W/ PHYS CONDITION    Emphysema of lung (HCC)    Foul smelling urine 07/22/2020   GERD (gastroesophageal reflux disease)    History of bronchitis    "not since I quit smoking" (07/26/2017)   History of kidney stones    History of shingles    Hyperlipidemia    takes Fish Oil daily   Hypertension    Hypoalbuminemia due to protein-calorie malnutrition (HCC)    L3 vertebral fracture (HCC) 09/01/2017   Lung cancer (HCC) 2023   Lung nodule 04/21/2022   Lung nodule seen on imaging study 08/14/2021   Added automatically from request for surgery [id number]   Myelopathy (HCC) 12/21/2016   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   Neuromuscular disorder (HCC)    tingling toes   Paraparesis (HCC) 12/15/2016   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   Pneumonia 2009   PONV (postoperative nausea and vomiting)    Radiculopathy 07/28/2017   Restless leg    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 Brayton Calin for approach;  Surgeon: Ditty, Raelene Bullocks, MD;  Location: [facility] OR;  Service: Neurosurgery;  Laterality: N/A;   APPLICATION OF ROBOTIC ASSISTANCE FOR SPINAL PROCEDURE  12/19/2016   Procedure: APPLICATION OF ROBOTIC ASSISTANCE FOR SPINAL PROCEDURE;  Surgeon: Raelene Bullocks Ditty, MD;  Location: [facility] OR;  Service: Neurosurgery;;   APPLICATION OF ROBOTIC ASSISTANCE FOR SPINAL PROCEDURE N/A 07/26/2017   Procedure: APPLICATION OF ROBOTIC ASSISTANCE FOR SPINAL PROCEDURE;  Surgeon: Ditty, Raelene Bullocks, MD;  Location: [facility] OR;  Service: Neurosurgery;  Laterality: N/A;   APPLICATION OF ROBOTIC ASSISTANCE FOR SPINAL PROCEDURE N/A 09/02/2017   Procedure: APPLICATION OF  ROBOTIC ASSISTANCE FOR SPINAL PROCEDURE;  Surgeon: Ditty, Raelene Bullocks, MD;  Location: [facility] OR;  Service: Neurosurgery;  Laterality: N/A;   BACK SURGERY     BLADDER SUSPENSION  1991   tack   BRONCHIAL BIOPSY  09/15/2021   Procedure: BRONCHIAL BIOPSIES;  Surgeon: Prudy Brownie, DO;  Location: [facility];  Service: Pulmonary;;   BRONCHIAL BIOPSY  05/04/2022   Procedure: BRONCHIAL BIOPSIES;  Surgeon: Prudy Brownie, DO;  Location: [facility];  Service: Pulmonary;;   BRONCHIAL BRUSHINGS  09/15/2021   Procedure: BRONCHIAL BRUSHINGS;  Surgeon: Prudy Brownie, DO;  Location: [facility];  Service: Pulmonary;;   BRONCHIAL NEEDLE ASPIRATION BIOPSY  09/15/2021   Procedure: BRONCHIAL NEEDLE ASPIRATION BIOPSIES;  Surgeon: Prudy Brownie, DO;  Location: [facility];  Service: Pulmonary;;   BRONCHIAL NEEDLE ASPIRATION BIOPSY  05/04/2022   Procedure: BRONCHIAL NEEDLE ASPIRATION BIOPSIES;  Surgeon: Prudy Brownie, DO;  Location: [facility];  Service:  Pulmonary;;   BRONCHIAL WASHINGS  09/15/2021   Procedure: BRONCHIAL WASHINGS;  Surgeon: Prudy Brownie, DO;  Location: [facility];  Service: Pulmonary;;   FIDUCIAL MARKER PLACEMENT  09/15/2021   Procedure: FIDUCIAL MARKER PLACEMENT;  Surgeon: Prudy Brownie, DO;  Location: [facility];  Service: Pulmonary;;   INGUINAL HERNIA REPAIR Right 1991   INTERCOSTAL NERVE BLOCK Left 06/16/2022   Procedure: INTERCOSTAL NERVE BLOCK;  Surgeon: Hilarie Lovely, MD;  Location: [facility] OR;  Service: Thoracic;  Laterality: Left;   LUMBAR FUSION  2016; 2017; 07/26/2017   L4-5; L2-3; L5-S1   LYMPH NODE DISSECTION Left 06/16/2022   Procedure: LYMPH NODE DISSECTION;  Surgeon: Hilarie Lovely, MD;  Location: [facility] OR;  Service: Thoracic;  Laterality: Left;   POSTERIOR LUMBAR FUSION 4 WITH HARDWARE REMOVAL N/A 09/02/2017   Procedure: Lumbar three-four redo laminectomy; Repositioning of Left Sacral two screw; Extension of lumbar fusion to Thoracic twelve;  Surgeon: Ditty, Raelene Bullocks, MD;  Location: [facility] OR;  Service: Neurosurgery;  Laterality: N/A;   TONSILLECTOMY AND ADENOIDECTOMY  1959   TUMOR EXCISION     WERTHIN'S TUMORS BOTH SIDES OF NECK   VAGINAL HYSTERECTOMY  1979   VIDEO BRONCHOSCOPY WITH RADIAL ENDOBRONCHIAL ULTRASOUND  09/15/2021   Procedure: VIDEO BRONCHOSCOPY WITH RADIAL ENDOBRONCHIAL ULTRASOUND;  Surgeon: Prudy Brownie, DO;  Location: [facility];  Service: Pulmonary;;   VIDEO BRONCHOSCOPY WITH RADIAL ENDOBRONCHIAL ULTRASOUND  05/04/2022   Procedure: VIDEO BRONCHOSCOPY WITH RADIAL ENDOBRONCHIAL ULTRASOUND;  Surgeon: Prudy Brownie, DO;  Location: [facility];  Service: Pulmonary;;   Family History  Problem Relation Age of Onset   Diabetes Mother    Dementia Mother    Cirrhosis Mother        Non alcoholic   COPD Father    Diabetes Brother    Social History   Socioeconomic History   Marital status: Married    Spouse name: Allen Israel   Number of children: 1   Years of education: 12    Highest education level: 12th grade  Occupational History   Not on file  Tobacco Use   Smoking status: Former    Current packs/day: 0.00    Average packs/day: 1.5 packs/day for 35.0 years (52.5 ttl pk-yrs)    Types: Cigarettes    Start date: 05/14/1971    Quit date: 05/13/2006    Years since quitting: 17.6   Smokeless tobacco: Never  Vaping Use   Vaping status: Never Used  Substance and Sexual Activity   Alcohol  use: Yes    Alcohol /week:  2.0 standard drinks of alcohol     Types: 2 Glasses of wine per week   Drug use: No   Sexual activity: Not Currently    Birth control/protection: Surgical  Other Topics Concern   Not on file  Social History Narrative   Lives w/ husband   Married.   1 child, 1 grandchildren.   Retired. Once worked for [employer].   Enjoys reading.    Social Drivers of Corporate investment banker Strain: Low Risk  (01/05/2024)   Overall Financial Resource Strain (CARDIA)    Difficulty of Paying Living Expenses: Not hard at all  Food Insecurity: No Food Insecurity (01/05/2024)   Hunger Vital Sign    Worried About Running Out of Food in the Last Year: Never true    Ran Out of Food in the Last Year: Never true  Transportation Needs: No Transportation Needs (01/05/2024)   PRAPARE - Administrator, Civil Service (Medical): No    Lack of Transportation (Non-Medical): No  Physical Activity: Inactive (01/05/2024)   Exercise Vital Sign    Days of Exercise per Week: 0 days    Minutes of Exercise per Session: 0 min  Stress: No Stress Concern Present (01/05/2024)   [employer] of Occupational Health - Occupational Stress Questionnaire    Feeling of Stress : Not at all  Recent Concern: Stress - Stress Concern Present (12/25/2023)   [employer] of Occupational Health - Occupational Stress Questionnaire    Feeling of Stress : To some extent  Social Connections: Moderately Isolated (01/05/2024)   Social Connection and Isolation Panel [NHANES]     Frequency of Communication with Friends and Family: More than three times a week    Frequency of Social Gatherings with Friends and Family: Once a week    Attends Religious Services: Never    Database administrator or Organizations: No    Attends Engineer, structural: Never    Marital Status: Married    Tobacco Counseling Counseling given: Not Answered    Clinical Intake:  Pre-visit preparation completed: Yes  Pain : No/denies pain     BMI - recorded: 26.76 Nutritional Status: BMI 25 -29 Overweight Nutritional Risks: None Diabetes: Yes CBG done?: Yes (pt BS: 89 this am at home) CBG resulted in Enter/ Edit results?: No Did pt. bring in CBG monitor from home?: No  Lab Results  Component Value Date   HGBA1C 6.4 (A) 12/29/2023   HGBA1C 6.3 (A) 06/30/2023   HGBA1C 5.3 12/29/2022     How often do you need to have someone help you when you read instructions, pamphlets, or other written materials from your doctor or pharmacy?: 1 - Never  Interpreter Needed?: No  Comments: lives with husband Information entered by :: B.Sukhdeep Wieting,LPN   Activities of Daily Living     01/01/2024   11:44 AM  In your present state of health, do you have any difficulty performing the following activities:  Hearing? 0  Vision? 0  Difficulty concentrating or making decisions? 0  Walking or climbing stairs? 1  Dressing or bathing? 0  Doing errands, shopping? 1  Preparing Food and eating ? N  Using the Toilet? N  In the past six months, have you accidently leaked urine? Y  Do you have problems with loss of bowel control? N  Managing your Medications? N  Managing your Finances? N  Housekeeping or managing your Housekeeping? Y    Patient Care Team: Clark, Katherine K, NP as  PCP - General (Internal Medicine) Elna Haggis, MD as Consulting Physician (Neurosurgery) [facility], [place]  Indicate any recent Medical Services you may have received from other than [facility]  providers in the past year (date may be approximate).     Assessment:   This is a routine wellness examination for Megan [name].  Hearing/Vision screen Hearing Screening - Comments:: Pt says her hearing is good Vision Screening - Comments:: Pt says her vision is good after cataract surgery for distance;readers only Dr [facility]   Goals Addressed             This Visit's Progress    Patient Stated   On track    01/05/24-, I will maintain and continue medications as prescribed.      Patient Stated       01/05/24-Still no new goals.       Depression Screen     01/05/2024    3:06 PM 01/04/2023   11:17 AM 12/29/2022    9:23 AM 10/11/2022    9:37 AM 01/01/2022    1:11 PM 01/01/2022    1:06 PM 07/31/2021    3:02 PM  PHQ 2/9 Scores  PHQ - 2 Score 0 0 2 0 0 0 0  PHQ- 9 Score  0 7    4    Fall Risk     01/01/2024   11:44 AM 12/29/2023    8:49 AM 01/04/2023   11:18 AM 01/01/2023   10:59 AM 12/29/2022    9:23 AM  Fall Risk   Falls in the past year? 0 0 0 0 0  Number falls in past yr: 0 0 0  0  Injury with Fall? 0 0 0  0  Risk for fall due to : No Fall Risks No Fall Risks No Fall Risks  No Fall Risks  Follow up Education provided;Falls prevention discussed Falls evaluation completed Falls prevention discussed;Falls evaluation completed  Falls evaluation completed    MEDICARE RISK AT HOME:  Medicare Risk at Home Any stairs in or around the home?: (Patient-Rptd) No If so, are there any without handrails?: (Patient-Rptd) No Home free of loose throw rugs in walkways, pet beds, electrical cords, etc?: (Patient-Rptd) Yes Adequate lighting in your home to reduce risk of falls?: (Patient-Rptd) Yes Life alert?: (Patient-Rptd) No Use of a cane, walker or w/c?: (Patient-Rptd) Yes Grab bars in the bathroom?: (Patient-Rptd) Yes Shower chair or bench in shower?: (Patient-Rptd) Yes Elevated toilet seat or a handicapped toilet?: (Patient-Rptd) Yes  TIMED UP AND GO:  Was the test  performed?  No  Cognitive Function: 6CIT completed    12/31/2020    1:25 PM 12/27/2019   11:22 AM  MMSE - Mini Mental State Exam  Orientation to time 5 5  Orientation to Place 5 5  Registration 3 3  Attention/ Calculation 5 5  Recall 3 3  Language- repeat 1 1        01/05/2024    3:17 PM 01/04/2023   11:20 AM  6CIT Screen  What Year? 0 points 0 points  What month? 0 points 0 points  What time? 0 points 0 points  Count back from 20 0 points 0 points  Months in reverse 0 points 0 points  Repeat phrase 0 points 4 points  Total Score 0 points 4 points    Immunizations Immunization History  Administered Date(s) Administered   Fluad Quad(high Dose 65+) 07/20/2019, 07/22/2020, 07/31/2021, 07/01/2022   Fluad Trivalent(High Dose 65+) 06/30/2023  Influenza, High Dose Seasonal PF 06/30/2018   Influenza,inj,Quad PF,6+ Mos 07/01/2017   Influenza-Unspecified 09/27/2016   PFIZER(Purple Top)SARS-COV-2 Vaccination 11/30/2019, 12/28/2019   PNEUMOCOCCAL CONJUGATE-20 08/03/2021   Pneumococcal Polysaccharide-23 04/07/2015, 07/22/2020   Tdap 04/07/2015   Zoster Recombinant(Shingrix) 11/06/2019, 01/17/2020    Screening Tests Health Maintenance  Topic Date Due   Colonoscopy  Never done   FOOT EXAM  01/30/2023   OPHTHALMOLOGY EXAM  11/02/2023   COVID-19 Vaccine (3 - Pfizer risk series) 01/14/2024 (Originally 01/25/2020)   INFLUENZA VACCINE  04/13/2024   Diabetic kidney evaluation - Urine ACR  06/29/2024   HEMOGLOBIN A1C  06/29/2024   Diabetic kidney evaluation - eGFR measurement  08/25/2024   Medicare Annual Wellness (AWV)  01/04/2025   MAMMOGRAM  01/17/2025   DTaP/Tdap/Td (2 - Td or Tdap) 04/06/2025   Pneumonia Vaccine 49+ Years old  Completed   DEXA SCAN  Completed   Hepatitis C Screening  Completed   Zoster Vaccines- Shingrix  Completed   HPV VACCINES  Aged Out   Meningococcal B Vaccine  Aged Out    Health Maintenance  Health Maintenance Due  Topic Date Due   Colonoscopy   Never done   FOOT EXAM  01/30/2023   OPHTHALMOLOGY EXAM  11/02/2023   Health Maintenance Items Addressed: MMG already ordered and scheduled  Additional Screening:  Vision Screening: Recommended annual ophthalmology exams for early detection of glaucoma and other disorders of the eye.  Dental Screening: Recommended annual dental exams for proper oral hygiene  Community Resource Referral / Chronic Care Management: CRR required this visit?  No   CCM required this visit?  No     Plan:     I have personally reviewed and noted the following in the patient's chart:   Medical and social history Use of alcohol , tobacco or illicit drugs  Current medications and supplements including opioid prescriptions. Patient is not currently taking opioid prescriptions. Functional ability and status Nutritional status Physical activity Advanced directives List of other physicians Hospitalizations, surgeries, and ER visits in previous 12 months Vitals Screenings to include cognitive, depression, and falls Referrals and appointments  In addition, I have reviewed and discussed with patient certain preventive protocols, quality metrics, and best practice recommendations. A written personalized care plan for preventive services as well as general preventive health recommendations were provided to patient.     Nerissa Bannister, LPN   [id number]   After Visit Summary: (MyChart) Due to this being a telephonic visit, the after visit summary with patients personalized plan was offered to patient via MyChart   Notes: Nothing significant to report at this time.

## 2024-01-05 NOTE — Patient Instructions (Signed)
 Megan [name] , Thank you for taking time to come for your Medicare Wellness Visit. I appreciate your ongoing commitment to your health goals. Please review the following plan we discussed and let me know if I can assist you in the future.   Referrals/Orders/Follow-Ups/Clinician Recommendations: none  This is a list of the screening recommended for you and due dates:  Health Maintenance  Topic Date Due   Colon Cancer Screening  Never done   Complete foot exam   01/30/2023   Eye exam for diabetics  11/02/2023   COVID-19 Vaccine (3 - Pfizer risk series) 01/14/2024*   Flu Shot  04/13/2024   Yearly kidney health urinalysis for diabetes  06/29/2024   Hemoglobin A1C  06/29/2024   Yearly kidney function blood test for diabetes  08/25/2024   Medicare Annual Wellness Visit  01/04/2025   Mammogram  01/17/2025   DTaP/Tdap/Td vaccine (2 - Td or Tdap) 04/06/2025   Pneumonia Vaccine  Completed   DEXA scan (bone density measurement)  Completed   Hepatitis C Screening  Completed   Zoster (Shingles) Vaccine  Completed   HPV Vaccine  Aged Out   Meningitis B Vaccine  Aged Out  *Topic was postponed. The date shown is not the original due date.    Advanced directives: (Declined) Advance directive discussed with you today. Even though you declined this today, please call our office should you change your mind, and we can give you the proper paperwork for you to fill out.  Next Medicare Annual Wellness Visit scheduled for next year: Yes 01/08/2025 @ 3pm televisit

## 2024-01-06 DIAGNOSIS — B880 Other acariasis: Secondary | ICD-10-CM | POA: Diagnosis not present

## 2024-01-16 ENCOUNTER — Other Ambulatory Visit: Payer: Self-pay | Admitting: Primary Care

## 2024-01-16 DIAGNOSIS — I10 Essential (primary) hypertension: Secondary | ICD-10-CM

## 2024-01-16 MED ORDER — AMLODIPINE BESYLATE 10 MG PO TABS
10.0000 mg | ORAL_TABLET | Freq: Every day | ORAL | 1 refills | Status: DC
Start: 2024-01-16 — End: 2024-07-17

## 2024-01-16 NOTE — Telephone Encounter (Signed)
 Refills sent to pharmacy.

## 2024-01-16 NOTE — Telephone Encounter (Unsigned)
 Copied from CRM [id number]. Topic: Clinical - Medication Refill >> Jan 16, 2024 11:29 AM Varney Gentleman wrote: Most Recent Primary Care Visit:  Provider: Nerissa Bannister  Department: [facility]  Visit Type: MEDICARE AWV, SEQUENTIAL  Date: 01/05/2024  Medication: amLODipine  (NORVASC ) 10 MG tablet  Has the patient contacted their pharmacy? No, new prescription never filled at location (Agent: If no, request that the patient contact the pharmacy for the refill. If patient does not wish to contact the pharmacy document the reason why and proceed with request.) (Agent: If yes, when and what did the pharmacy advise?)  Is this the correct pharmacy for this prescription? Yes If no, delete pharmacy and type the correct one.  This is the patient's preferred pharmacy:  [facility] [address] [place], [place] - [address] [place] [address] Phone: [phone] Fax: [phone]   Has the prescription been filled recently? No  Is the patient out of the medication? No  Has the patient been seen for an appointment in the last year OR does the patient have an upcoming appointment? Yes  Can we respond through MyChart? Yes  Agent: Please be advised that Rx refills may take up to 3 business days. We ask that you follow-up with your pharmacy.

## 2024-01-19 ENCOUNTER — Ambulatory Visit
Admission: RE | Admit: 2024-01-19 | Discharge: 2024-01-19 | Disposition: A | Discharge status: Home / Self Care | Source: Ambulatory Visit | Attending: Primary Care | Admitting: Primary Care

## 2024-01-19 DIAGNOSIS — Z0000 Encounter for general adult medical examination without abnormal findings: Secondary | ICD-10-CM

## 2024-01-19 DIAGNOSIS — Z1231 Encounter for screening mammogram for malignant neoplasm of breast: Secondary | ICD-10-CM | POA: Diagnosis not present

## 2024-01-26 ENCOUNTER — Encounter: Payer: Self-pay | Admitting: Internal Medicine

## 2024-01-26 ENCOUNTER — Ambulatory Visit (INDEPENDENT_AMBULATORY_CARE_PROVIDER_SITE_OTHER): Admitting: Internal Medicine

## 2024-01-26 ENCOUNTER — Ambulatory Visit: Payer: Self-pay | Admitting: *Deleted

## 2024-01-26 VITALS — BP 122/64 | HR 77 | Temp 98.5°F | Ht 68.0 in

## 2024-01-26 DIAGNOSIS — M62838 Other muscle spasm: Secondary | ICD-10-CM | POA: Diagnosis not present

## 2024-01-26 NOTE — Assessment & Plan Note (Addendum)
 This is now worse on right only No neurologic changes from the past---just the right hip flexor weakness Probably needs to establish with new neurosurgeon ---will leave to PCP Okay to briefly increase the baclofen --- it can be higher dose for spasticity and the jerking as element of that (asked her to try 20mg  just for when this is bad). Can take this up to tid--but go back to 10mg  when things ease up

## 2024-01-26 NOTE — Telephone Encounter (Signed)
 I made Dr Joelle Musca aware of the response from Polly Brink about her questions and recommendations.

## 2024-01-26 NOTE — Progress Notes (Signed)
 Subjective:    Patient ID: Megan [name], female    DOB: Sep 20, 1950, 73 y.o.   MRN: [id number]  HPI Here due to worsening of right leg spasms With husband  Severe spinal stenosis noted in 2016--did have surgery for disc repair and fusion Had L3 compression fracture in 2018 with narrowing of thecal sac and foraminal stenosis 5 surgeries all together  Takes the baclofen  regularly--at least 2 a day Gabapentin  twice a day--and tylenol --these don't help too much  May get brief right leg spasms---like 5- 10 minutes Today had spasm that lasted 3 hours starting ~10AM--just while sitting in recliner Tried to walk and stretch--nothing helped Took baclofen  early---and it helped---but still feeling it now and intermittent Actually jerks her leg  No other type of pain--never had back pain No leg weakness in general ---uses rollator (but used wheelchair today) No sensory changes in legs---chronic tingling on right since surgery  Current Outpatient Medications on File Prior to Visit  Medication Sig Dispense Refill   acetaminophen  (TYLENOL ) 500 MG tablet Take 500 mg by mouth every 8 (eight) hours as needed (for pain).     amLODipine  (NORVASC ) 10 MG tablet Take 1 tablet (10 mg total) by mouth daily. for blood pressure. 90 tablet 1   atorvastatin  (LIPITOR) 40 MG tablet Take 1 tablet (40 mg total) by mouth daily. for cholesterol. 90 tablet 1   baclofen  (LIORESAL ) 10 MG tablet Take 1 tablet (10 mg total) by mouth 3 (three) times daily. For muscle spasms. 270 each 1   Biotin  5000 MCG CAPS Take 5,000 mcg by mouth every morning.      cetirizine  (ZYRTEC ) 10 MG tablet Take 1 tablet (10 mg total) by mouth daily. For allergies 90 tablet 0   Cholecalciferol  (VITAMIN D ) 50 MCG (2000 UT) tablet Take 2,000 Units by mouth daily.     Coenzyme Q10 (COQ10) 100 MG CAPS Take 100 mg by mouth every evening.     esomeprazole  (NEXIUM  24HR) 20 MG capsule Take 1 capsule (20 mg total) by mouth daily at 12 noon. 90  capsule 1   gabapentin  (NEURONTIN ) 300 MG capsule Take 1 capsule (300 mg total) by mouth 2 (two) times daily. For back pain 180 capsule 2   glipiZIDE  (GLUCOTROL ) 5 MG tablet TAKE 1 TABLET(5 MG) BY MOUTH TWICE DAILY BEFORE A MEAL FOR DIABETES 180 tablet 0   lisinopril  (ZESTRIL ) 20 MG tablet Take 1 tablet (20 mg total) by mouth daily. for blood pressure. 90 tablet 2   metFORMIN  (GLUCOPHAGE ) 500 MG tablet Take 1 tablet (500 mg total) by mouth 2 (two) times daily with a meal. for diabetes. 180 tablet 1   nystatin-triamcinolone ointment (MYCOLOG) Apply 1 Application topically 2 (two) times daily as needed (irritation).     oxybutynin  (DITROPAN -XL) 5 MG 24 hr tablet Take 1 tablet (5 mg total) by mouth at bedtime. For overactive bladder 90 tablet 1   oxybutynin  (OXYTROL ) 3.9 MG/24HR Place 1 patch onto the skin See admin instructions. Change every 5 days     Polyethyl Glycol-Propyl Glycol (LUBRICANT EYE DROPS) 0.4-0.3 % SOLN Place 1-2 drops into both eyes 2 (two) times daily.     predniSONE  (DELTASONE ) 20 MG tablet Take 2 tablets (40mg ) by mouth once daily for 4 days. Then take 1 tablet (20mg ) by mouth once daily for 4 days. 12 tablet 0   senna-docusate (SENOKOT-S) 8.6-50 MG tablet Take 2 tablets by mouth 2 (two) times daily. (Patient taking differently: Take 1 tablet by mouth every  other day.)     No current facility-administered medications on file prior to visit.    Allergies  Allergen Reactions   Shellfish Allergy Anaphylaxis, Swelling and Other (See Comments)    Tongue swells   Oxycodone  Nausea Only   Penicillins Rash and Other (See Comments)    Has patient had a PCN reaction causing immediate rash, facial/tongue/throat swelling, SOB or lightheadedness with hypotension: Yes Has patient had a PCN reaction causing severe rash involving mucus membranes or skin necrosis: No Has patient had a PCN reaction that required hospitalization No Has patient had a PCN reaction occurring within the last 10  years: No If all of the above answers are "NO", then may proceed with Cephalosporin use.    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   Constipation due to pain medication    Debility    Depression    Dyspnea    W/ PHYS CONDITION    Emphysema of lung (HCC)    Foul smelling urine 07/22/2020   GERD (gastroesophageal reflux disease)    History of bronchitis    "not since I quit smoking" (07/26/2017)   History of kidney stones    History of shingles    Hyperlipidemia    takes Fish Oil daily   Hypertension    Hypoalbuminemia due to protein-calorie malnutrition (HCC)    L3 vertebral fracture (HCC) 09/01/2017   Lung cancer (HCC) 2023   Lung nodule 04/21/2022   Lung nodule seen on imaging study 08/14/2021   Added automatically from request for surgery [id number]   Myelopathy (HCC) 12/21/2016   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   Neuromuscular disorder (HCC)    tingling toes   Paraparesis (HCC) 12/15/2016   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   Pneumonia 2009   PONV (postoperative nausea and vomiting)    Radiculopathy 07/28/2017   Restless leg    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 Brayton Calin for approach;  Surgeon: Ditty, Raelene Bullocks, MD;  Location: [facility] OR;  Service: Neurosurgery;  Laterality: N/A;   APPLICATION OF ROBOTIC ASSISTANCE FOR SPINAL PROCEDURE  12/19/2016   Procedure: APPLICATION OF ROBOTIC ASSISTANCE FOR SPINAL PROCEDURE;  Surgeon: Raelene Bullocks Ditty, MD;  Location: [facility] OR;  Service: Neurosurgery;;   APPLICATION OF ROBOTIC ASSISTANCE FOR SPINAL PROCEDURE N/A 07/26/2017   Procedure: APPLICATION OF ROBOTIC ASSISTANCE FOR SPINAL PROCEDURE;  Surgeon: Ditty, Raelene Bullocks, MD;  Location: [facility] OR;  Service: Neurosurgery;  Laterality: N/A;   APPLICATION OF ROBOTIC ASSISTANCE FOR SPINAL PROCEDURE N/A 09/02/2017   Procedure: APPLICATION OF ROBOTIC ASSISTANCE FOR SPINAL PROCEDURE;  Surgeon: Ditty, Raelene Bullocks, MD;  Location: [facility] OR;  Service: Neurosurgery;  Laterality: N/A;  BACK SURGERY     BLADDER SUSPENSION  1991   tack   BRONCHIAL BIOPSY  09/15/2021   Procedure: BRONCHIAL BIOPSIES;  Surgeon: Prudy Brownie, DO;  Location: [facility];  Service: Pulmonary;;   BRONCHIAL BIOPSY  05/04/2022   Procedure: BRONCHIAL BIOPSIES;  Surgeon: Prudy Brownie, DO;  Location: [facility];  Service: Pulmonary;;   BRONCHIAL BRUSHINGS  09/15/2021   Procedure: BRONCHIAL BRUSHINGS;  Surgeon: Prudy Brownie, DO;  Location: [facility];  Service: Pulmonary;;   BRONCHIAL NEEDLE ASPIRATION BIOPSY  09/15/2021   Procedure: BRONCHIAL NEEDLE ASPIRATION BIOPSIES;  Surgeon: Prudy Brownie, DO;  Location: [facility];  Service: Pulmonary;;   BRONCHIAL NEEDLE ASPIRATION BIOPSY  05/04/2022   Procedure: BRONCHIAL NEEDLE ASPIRATION BIOPSIES;  Surgeon: Prudy Brownie, DO;  Location: [facility];  Service: Pulmonary;;   BRONCHIAL WASHINGS  09/15/2021   Procedure: BRONCHIAL WASHINGS;  Surgeon: Prudy Brownie, DO;  Location: [facility];  Service: Pulmonary;;   FIDUCIAL MARKER PLACEMENT  09/15/2021   Procedure: FIDUCIAL MARKER PLACEMENT;  Surgeon: Prudy Brownie, DO;  Location: [facility];  Service: Pulmonary;;   INGUINAL HERNIA REPAIR Right 1991   INTERCOSTAL NERVE BLOCK Left 06/16/2022   Procedure: INTERCOSTAL NERVE BLOCK;  Surgeon: Hilarie Lovely, MD;  Location: [facility] OR;  Service: Thoracic;  Laterality: Left;   LUMBAR FUSION  2016; 2017; 07/26/2017   L4-5; L2-3; L5-S1   LYMPH NODE DISSECTION Left 06/16/2022   Procedure: LYMPH NODE DISSECTION;  Surgeon: Hilarie Lovely, MD;  Location: [facility] OR;  Service: Thoracic;  Laterality: Left;   POSTERIOR LUMBAR FUSION 4 WITH HARDWARE REMOVAL N/A 09/02/2017   Procedure: Lumbar three-four redo laminectomy; Repositioning of Left Sacral two screw; Extension of lumbar fusion to Thoracic twelve;  Surgeon: Ditty, Raelene Bullocks, MD;  Location: [facility] OR;  Service: Neurosurgery;  Laterality: N/A;   TONSILLECTOMY AND ADENOIDECTOMY  1959   TUMOR EXCISION     WERTHIN'S TUMORS BOTH SIDES OF NECK   VAGINAL HYSTERECTOMY  1979   VIDEO BRONCHOSCOPY WITH RADIAL ENDOBRONCHIAL ULTRASOUND  09/15/2021   Procedure: VIDEO BRONCHOSCOPY WITH RADIAL ENDOBRONCHIAL ULTRASOUND;  Surgeon: Prudy Brownie, DO;  Location: [facility];  Service: Pulmonary;;   VIDEO BRONCHOSCOPY WITH RADIAL ENDOBRONCHIAL ULTRASOUND  05/04/2022   Procedure: VIDEO BRONCHOSCOPY WITH RADIAL ENDOBRONCHIAL ULTRASOUND;  Surgeon: Prudy Brownie, DO;  Location: [facility];  Service: Pulmonary;;    Family History  Problem Relation Age of Onset   Diabetes Mother    Dementia Mother    Cirrhosis Mother        Non alcoholic   COPD Father    Diabetes Brother     Social History   Socioeconomic History   Marital status: Married    Spouse name: Allen Israel   Number of children: 1   Years of education: 12   Highest education level: 12th grade  Occupational History   Not on file  Tobacco Use   Smoking status: Former    Current packs/day: 0.00    Average packs/day: 1.5 packs/day for 35.0 years (52.5 ttl pk-yrs)    Types: Cigarettes    Start date: 05/14/1971    Quit date:  05/13/2006    Years since quitting: 17.7   Smokeless tobacco: Never  Vaping Use   Vaping status: Never Used  Substance and Sexual Activity   Alcohol  use: Yes    Alcohol /week: 2.0 standard drinks of alcohol     Types: 2 Glasses of wine per week   Drug use: No  Sexual activity: Not Currently    Birth control/protection: Surgical  Other Topics Concern   Not on file  Social History Narrative   Lives w/ husband   Married.   1 child, 1 grandchildren.   Retired. Once worked for [employer].   Enjoys reading.    Social Drivers of Corporate investment banker Strain: Low Risk  (01/05/2024)   Overall Financial Resource Strain (CARDIA)    Difficulty of Paying Living Expenses: Not hard at all  Food Insecurity: No Food Insecurity (01/05/2024)   Hunger Vital Sign    Worried About Running Out of Food in the Last Year: Never true    Ran Out of Food in the Last Year: Never true  Transportation Needs: No Transportation Needs (01/05/2024)   PRAPARE - Administrator, Civil Service (Medical): No    Lack of Transportation (Non-Medical): No  Physical Activity: Inactive (01/05/2024)   Exercise Vital Sign    Days of Exercise per Week: 0 days    Minutes of Exercise per Session: 0 min  Stress: No Stress Concern Present (01/05/2024)   [employer] of Occupational Health - Occupational Stress Questionnaire    Feeling of Stress : Not at all  Recent Concern: Stress - Stress Concern Present (12/25/2023)   [employer] of Occupational Health - Occupational Stress Questionnaire    Feeling of Stress : To some extent  Social Connections: Moderately Isolated (01/05/2024)   Social Connection and Isolation Panel [NHANES]    Frequency of Communication with Friends and Family: More than three times a week    Frequency of Social Gatherings with Friends and Family: Once a week    Attends Religious Services: Never    Database administrator or Organizations: No    Attends Banker  Meetings: Never    Marital Status: Married  Catering manager Violence: Not At Risk (01/05/2024)   Humiliation, Afraid, Rape, and Kick questionnaire    Fear of Current or Ex-Partner: No    Emotionally Abused: No    Physically Abused: No    Sexually Abused: No   Review of Systems No change in sensation in perineum Chronic urinary incontinence--no change Bowels always slow since surgery--still needs laxatives    Objective:   Physical Exam Constitutional:      Appearance: Normal appearance.  Musculoskeletal:     Comments: No back tenderness  Neurological:     Mental Status: She is alert.     Comments: Several jerks of right leg during visit Flexion at right hip is only 3/5. Leg strength otherwise 4-4+/5 Can walk            Assessment & Plan:

## 2024-01-26 NOTE — Telephone Encounter (Signed)
  Chief Complaint: back pain worsening with shooting pain and spasms down right leg Symptoms: spasms down right leg constant and not letting up after 3 hours. Takes baclofen . Can walk with assistive device.  Frequency: past 3 hours Pertinent Negatives: Patient denies burning no swelling reported. No fever reported . Disposition: [] ED /[] Urgent Care (no appt availability in office) / [x] Appointment(In office/virtual)/ []  [facility]/ [] Home Care/ [] Refused Recommended Disposition /[] [facility]/ []  Follow-up with PCP Additional Notes:   No appt available with PCP. Scheduled with other provider today. Patient reports she is going to take another baclofen  and see if spasms slow down.       Copied from CRM [id number]. Topic: Clinical - Red Word Triage >> Jan 26, 2024  1:32 PM Megan [name] wrote: [facility] that prompted transfer to Nurse Triage: Palled called regarding hx of back surgery done a few years ago. She is currently experiencing right leg spasms radiating down the leg, persisting for approximately three hours. Reason for Disposition  [1] SEVERE back pain (e.g., excruciating, unable to do any normal activities) AND [2] not improved 2 hours after pain medicine  Answer Assessment - Initial Assessment Questions 1. ONSET: "When did the pain begin?"      3 hours ago  2. LOCATION: "Where does it hurt?" (upper, mid or lower back)     Right leg  3. SEVERITY: "How bad is the pain?"  (e.g., Scale 1-10; mild, moderate, or severe)   - MILD (1-3): Doesn't interfere with normal activities.    - MODERATE (4-7): Interferes with normal activities or awakens from sleep.    - SEVERE (8-10): Excruciating pain, unable to do any normal activities.      7/10 4. PATTERN: "Is the pain constant?" (e.g., yes, no; constant, intermittent)      Constant  5. RADIATION: "Does the pain shoot into your legs or somewhere else?"     Yes right leg 6. CAUSE:  "What do you think is causing the back  pain?"      Not sure spasms usually go away  7. BACK OVERUSE:  "Any recent lifting of heavy objects, strenuous work or exercise?"     Na  8. MEDICINES: "What have you taken so far for the pain?" (e.g., nothing, acetaminophen , NSAIDS)     Baclofen   9. NEUROLOGIC SYMPTOMS: "Do you have any weakness, numbness, or problems with bowel/bladder control?"     Na  10. OTHER SYMPTOMS: "Do you have any other symptoms?" (e.g., fever, abdomen pain, burning with urination, blood in urine)       Back pain , shoots down right spasms. Pain level 7/10 11. PREGNANCY: "Is there any chance you are pregnant?" "When was your last menstrual period?"       na  Protocols used: Back Pain-A-AH

## 2024-01-26 NOTE — Telephone Encounter (Signed)
 She mentioned this last time during her visit, this seems to have progressed though.  Is she experiencing low back pain?  Just the spasms? It looks like she saw neurosurgery from 2016 and 2018.  If she believes that her symptoms are coming from her lower back then we need to get her in with her neurosurgeon again.

## 2024-02-02 ENCOUNTER — Encounter: Payer: Self-pay | Admitting: Family Medicine

## 2024-02-02 ENCOUNTER — Ambulatory Visit (INDEPENDENT_AMBULATORY_CARE_PROVIDER_SITE_OTHER): Admitting: Family Medicine

## 2024-02-02 ENCOUNTER — Telehealth: Payer: Self-pay | Admitting: Primary Care

## 2024-02-02 VITALS — BP 130/64 | HR 82 | Temp 98.0°F | Ht 68.0 in | Wt 179.5 lb

## 2024-02-02 DIAGNOSIS — I10 Essential (primary) hypertension: Secondary | ICD-10-CM | POA: Diagnosis not present

## 2024-02-02 DIAGNOSIS — R300 Dysuria: Secondary | ICD-10-CM

## 2024-02-02 DIAGNOSIS — M62838 Other muscle spasm: Secondary | ICD-10-CM

## 2024-02-02 DIAGNOSIS — Z9889 Other specified postprocedural states: Secondary | ICD-10-CM

## 2024-02-02 DIAGNOSIS — R200 Anesthesia of skin: Secondary | ICD-10-CM

## 2024-02-02 DIAGNOSIS — M4727 Other spondylosis with radiculopathy, lumbosacral region: Secondary | ICD-10-CM

## 2024-02-02 LAB — POC URINALSYSI DIPSTICK (AUTOMATED)
Bilirubin, UA: NEGATIVE
Blood, UA: NEGATIVE
Glucose, UA: NEGATIVE
Ketones, UA: NEGATIVE
Nitrite, UA: POSITIVE
Protein, UA: NEGATIVE
Spec Grav, UA: 1.015 (ref 1.010–1.025)
Urobilinogen, UA: 0.2 U/dL
pH, UA: 6 (ref 5.0–8.0)

## 2024-02-02 MED ORDER — PREDNISONE 20 MG PO TABS
ORAL_TABLET | ORAL | 0 refills | Status: DC
Start: 2024-02-02 — End: 2024-02-27

## 2024-02-02 MED ORDER — LISINOPRIL 20 MG PO TABS: 20.0000 mg | ORAL_TABLET | Freq: Every day | ORAL | 1 refills | Status: DC

## 2024-02-02 MED ORDER — SULFAMETHOXAZOLE-TRIMETHOPRIM 800-160 MG PO TABS: 1.0000 | ORAL_TABLET | Freq: Two times a day (BID) | ORAL | 0 refills | Status: DC

## 2024-02-02 NOTE — Addendum Note (Signed)
 Addended by: Sahithi Ordoyne K on: 02/02/2024 01:31 PM   Modules accepted: Orders

## 2024-02-02 NOTE — Assessment & Plan Note (Signed)
 Acute, most consistent with UTI.  UA positive for wbc and nitrates. Will treat with Bactrim  double strength 1 tablet twice daily for 3 days.  Push fluids, continue cranberry.  Will send urine for culture. If symptoms not improving as expected or if intolerant of antibiotic, vomiting or fever on antibiotics she will contact us  versus go to ER.

## 2024-02-02 NOTE — Telephone Encounter (Signed)
 Called patient and reviewed all information. Patient verbalized understanding. Will call if any further questions.

## 2024-02-02 NOTE — Telephone Encounter (Signed)
 Let her know that I placed a new referral for Dr. Larrie Po.  I will send another round of prednisone  to her pharmacy but we cannot make this a recurring prescription due to potential problems that the prednisone  may cause.  I do recommend that she mention her arm symptoms to the neurosurgeon.

## 2024-02-02 NOTE — Progress Notes (Signed)
 Patient ID: Megan [name], female    DOB: 1950-10-16, 73 y.o.   MRN: [id number]  This visit was conducted in person.  BP 130/64   Pulse 82   Temp 98 F (36.7 C) (Temporal)   Ht 5\' 8"  (1.727 m)   Wt 179 lb 8 oz (81.4 kg)   SpO2 96%   BMI 27.29 kg/m    CC:  Chief Complaint  Patient presents with   Dysuria    Subjective:   HPI: Megan [name] is a 73 y.o. female  patient of [facility] with history of DM presenting on 02/02/2024 for Dysuria  Dysuria  This is a new problem. The current episode started in the past 7 days (3 days ago). The problem has been gradually worsening. The quality of the pain is described as burning. The pain is moderate. She is Not sexually active. There is No history of pyelonephritis. Associated symptoms include frequency and urgency. Pertinent negatives include no chills, discharge, flank pain, hematuria, hesitancy, nausea, possible pregnancy, sweats or vomiting. Associated symptoms comments:  Pain in lower abdomen. She has tried increased fluids for the symptoms. The treatment provided no relief. Her past medical history is significant for recurrent UTIs. There is no history of catheterization, kidney stones, a single kidney, urinary stasis or a urological procedure. LAst UTI last year     PCN allergy: rash  Cipro  has not helped in past.         Relevant past medical, surgical, family and social history reviewed and updated as indicated. Interim medical history since our last visit reviewed. Allergies and medications reviewed and updated. Outpatient Medications Prior to Visit  Medication Sig Dispense Refill   acetaminophen  (TYLENOL ) 500 MG tablet Take 500 mg by mouth every 8 (eight) hours as needed (for pain).     amLODipine  (NORVASC ) 10 MG tablet Take 1 tablet (10 mg total) by mouth daily. for blood pressure. 90 tablet 1   atorvastatin  (LIPITOR) 40 MG tablet Take 1 tablet (40 mg total) by mouth daily. for cholesterol. 90 tablet 1   baclofen   (LIORESAL ) 10 MG tablet Take 1 tablet (10 mg total) by mouth 3 (three) times daily. For muscle spasms. 270 each 1   Biotin  5000 MCG CAPS Take 5,000 mcg by mouth every morning.      cetirizine  (ZYRTEC ) 10 MG tablet Take 1 tablet (10 mg total) by mouth daily. For allergies 90 tablet 0   Cholecalciferol  (VITAMIN D ) 50 MCG (2000 UT) tablet Take 2,000 Units by mouth daily.     Coenzyme Q10 (COQ10) 100 MG CAPS Take 100 mg by mouth every evening.     esomeprazole  (NEXIUM  24HR) 20 MG capsule Take 1 capsule (20 mg total) by mouth daily at 12 noon. 90 capsule 1   gabapentin  (NEURONTIN ) 300 MG capsule Take 1 capsule (300 mg total) by mouth 2 (two) times daily. For back pain 180 capsule 2   glipiZIDE  (GLUCOTROL ) 5 MG tablet TAKE 1 TABLET(5 MG) BY MOUTH TWICE DAILY BEFORE A MEAL FOR DIABETES 180 tablet 0   metFORMIN  (GLUCOPHAGE ) 500 MG tablet Take 1 tablet (500 mg total) by mouth 2 (two) times daily with a meal. for diabetes. 180 tablet 1   nystatin-triamcinolone ointment (MYCOLOG) Apply 1 Application topically 2 (two) times daily as needed (irritation).     oxybutynin  (DITROPAN -XL) 5 MG 24 hr tablet Take 1 tablet (5 mg total) by mouth at bedtime. For overactive bladder 90 tablet 1   oxybutynin  (OXYTROL ) 3.9  MG/24HR Place 1 patch onto the skin See admin instructions. Change every 5 days     Polyethyl Glycol-Propyl Glycol (LUBRICANT EYE DROPS) 0.4-0.3 % SOLN Place 1-2 drops into both eyes 2 (two) times daily.     senna-docusate (SENOKOT-S) 8.6-50 MG tablet Take 2 tablets by mouth 2 (two) times daily. (Patient taking differently: Take 1 tablet by mouth every other day.)     XDEMVY 0.25 % SOLN Apply 1 drop to eye every 12 (twelve) hours.     lisinopril  (ZESTRIL ) 20 MG tablet Take 1 tablet (20 mg total) by mouth daily. for blood pressure. 90 tablet 2   predniSONE  (DELTASONE ) 20 MG tablet Take 2 tablets (40mg ) by mouth once daily for 4 days. Then take 1 tablet (20mg ) by mouth once daily for 4 days. 12 tablet 0   No  facility-administered medications prior to visit.     Per HPI unless specifically indicated in ROS section below Review of Systems  Constitutional:  Negative for chills, fatigue and fever.  HENT:  Negative for congestion.   Eyes:  Negative for pain.  Respiratory:  Negative for cough and shortness of breath.   Cardiovascular:  Negative for chest pain, palpitations and leg swelling.  Gastrointestinal:  Negative for abdominal pain, nausea and vomiting.  Genitourinary:  Positive for dysuria, frequency and urgency. Negative for flank pain, hematuria, hesitancy and vaginal bleeding.  Musculoskeletal:  Negative for back pain.  Neurological:  Negative for syncope, light-headedness and headaches.  Psychiatric/Behavioral:  Negative for dysphoric mood.    Objective:  BP 130/64   Pulse 82   Temp 98 F (36.7 C) (Temporal)   Ht 5\' 8"  (1.727 m)   Wt 179 lb 8 oz (81.4 kg)   SpO2 96%   BMI 27.29 kg/m   Wt Readings from Last 3 Encounters:  02/02/24 179 lb 8 oz (81.4 kg)  01/05/24 176 lb (79.8 kg)  12/29/23 176 lb (79.8 kg)      Physical Exam Constitutional:      General: She is not in acute distress.    Appearance: Normal appearance. She is well-developed. She is not ill-appearing or toxic-appearing.  HENT:     Head: Normocephalic.     Right Ear: Hearing, tympanic membrane, ear canal and external ear normal. Tympanic membrane is not erythematous, retracted or bulging.     Left Ear: Hearing, tympanic membrane, ear canal and external ear normal. Tympanic membrane is not erythematous, retracted or bulging.     Nose: No mucosal edema or rhinorrhea.     Right Sinus: No maxillary sinus tenderness or frontal sinus tenderness.     Left Sinus: No maxillary sinus tenderness or frontal sinus tenderness.     Mouth/Throat:     Pharynx: Uvula midline.  Eyes:     General: Lids are normal. Lids are everted, no foreign bodies appreciated.     Conjunctiva/sclera: Conjunctivae normal.     Pupils: Pupils  are equal, round, and reactive to light.  Neck:     Thyroid : No thyroid  mass or thyromegaly.     Vascular: No carotid bruit.     Trachea: Trachea normal.  Cardiovascular:     Rate and Rhythm: Normal rate and regular rhythm.     Pulses: Normal pulses.     Heart sounds: Normal heart sounds, S1 normal and S2 normal. No murmur heard.    No friction rub. No gallop.  Pulmonary:     Effort: Pulmonary effort is normal. No tachypnea or respiratory distress.  Breath sounds: Normal breath sounds. No decreased breath sounds, wheezing, rhonchi or rales.  Abdominal:     General: Bowel sounds are normal.     Palpations: Abdomen is soft.     Tenderness: There is no abdominal tenderness.  Musculoskeletal:     Cervical back: Normal range of motion and neck supple.  Skin:    General: Skin is warm and dry.     Findings: No rash.  Neurological:     Mental Status: She is alert.  Psychiatric:        Mood and Affect: Mood is not anxious or depressed.        Speech: Speech normal.        Behavior: Behavior normal. Behavior is cooperative.        Thought Content: Thought content normal.        Judgment: Judgment normal.       Results for orders placed or performed in visit on 02/02/24  POCT Urinalysis Dipstick (Automated)   Collection Time: 02/02/24  8:33 AM  Result Value Ref Range   Color, UA Yellow    Clarity, UA Hazy    Glucose, UA Negative Negative   Bilirubin, UA Negative    Ketones, UA Negative    Spec Grav, UA 1.015 1.010 - 1.025   Blood, UA Negative    pH, UA 6.0 5.0 - 8.0   Protein, UA Negative Negative   Urobilinogen, UA 0.2 0.2 or 1.0 E.U./dL   Nitrite, UA Positive    Leukocytes, UA Small (1+) (A) Negative    Assessment and Plan  Dysuria Assessment & Plan: Acute, most consistent with UTI.  UA positive for wbc and nitrates. Will treat with Bactrim  double strength 1 tablet twice daily for 3 days.  Push fluids, continue cranberry.  Will send urine for culture. If symptoms  not improving as expected or if intolerant of antibiotic, vomiting or fever on antibiotics she will contact us  versus go to ER.  Orders: -     POCT Urinalysis Dipstick (Automated) -     Urine Culture  Essential hypertension -     Lisinopril ; Take 1 tablet (20 mg total) by mouth daily. for blood pressure.  Dispense: 90 tablet; Refill: 1  Other orders -     Sulfamethoxazole -Trimethoprim ; Take 1 tablet by mouth 2 (two) times daily.  Dispense: 6 tablet; Refill: 0    No follow-ups on file.   Herby Lolling, MD

## 2024-02-02 NOTE — Telephone Encounter (Signed)
 Pt came in and stated that Megan [name] gave her meditation for her arm and wants to know if there anything over the counter she can take for the pain in her shoulder  pt also would like to know if its ok to keep taking the baclofen  that   Dr.letvax  gave her pt stated that Dr letvax up her dose up the dose

## 2024-02-02 NOTE — Telephone Encounter (Signed)
 Reviewed office visit from Dr. Joelle Musca who mentions that she can temporarily increase her baclofen  to 20 mg if needed.  We do not recommend this on a daily basis.  I would also like to refer her to neurosurgery given her increased spasticity and symptoms.  It looks like she previously saw Dr. Tharon Finder.  Does she want to return to him or someone else?  Looks like we gave her some prednisone  for the numbness and tingling in her arm.  Did that help previously?  She should also address this with her neurosurgeon.  She may benefit from a steroid injection.

## 2024-02-02 NOTE — Telephone Encounter (Signed)
 Called patient and reviewed all information. Patient verbalized understanding.  She would like to see Dr. Larrie [name], states Dr. Tharon Finder is not longer at the practice. The prednisone  did previously help with her numbness and tingling in the arm.

## 2024-02-04 LAB — URINE CULTURE
MICRO NUMBER:: 16489772
SPECIMEN QUALITY:: ADEQUATE

## 2024-02-07 ENCOUNTER — Ambulatory Visit: Payer: Self-pay | Admitting: Family Medicine

## 2024-02-07 MED ORDER — NITROFURANTOIN MONOHYD MACRO 100 MG PO CAPS: 100.0000 mg | ORAL_CAPSULE | Freq: Two times a day (BID) | ORAL | 0 refills | Status: DC

## 2024-02-07 NOTE — Telephone Encounter (Signed)
 Copied from CRM [id number]. Topic: MyChart - Other >> Feb 07, 2024  1:02 PM [name] wrote: Reason for CRM: patient calling in stating MyChart is not accepting the las 4 of her SS#  MyChart Help informed patient to call provider to correct  SS#.  SS# was verified by patient  Per [facility] routing to Admin pool Please call patient regarding this issue  at [phone]

## 2024-02-08 DIAGNOSIS — B880 Other acariasis: Secondary | ICD-10-CM | POA: Diagnosis not present

## 2024-02-10 ENCOUNTER — Other Ambulatory Visit: Payer: Self-pay

## 2024-02-10 DIAGNOSIS — E1165 Type 2 diabetes mellitus with hyperglycemia: Secondary | ICD-10-CM

## 2024-02-10 MED ORDER — METFORMIN HCL 500 MG PO TABS: 500.0000 mg | ORAL_TABLET | Freq: Two times a day (BID) | ORAL | 1 refills | Status: DC

## 2024-02-13 DIAGNOSIS — L658 Other specified nonscarring hair loss: Secondary | ICD-10-CM | POA: Diagnosis not present

## 2024-02-13 DIAGNOSIS — L930 Discoid lupus erythematosus: Secondary | ICD-10-CM | POA: Diagnosis not present

## 2024-02-16 ENCOUNTER — Telehealth: Payer: Self-pay | Admitting: Medical Oncology

## 2024-02-16 NOTE — Telephone Encounter (Signed)
 Pt requests to  add on a CT A/P with her CT chest  on 06/09-monday

## 2024-02-20 ENCOUNTER — Other Ambulatory Visit: Payer: Self-pay | Admitting: Primary Care

## 2024-02-20 ENCOUNTER — Telehealth: Payer: Self-pay

## 2024-02-20 ENCOUNTER — Encounter (HOSPITAL_COMMUNITY): Payer: Self-pay

## 2024-02-20 ENCOUNTER — Inpatient Hospital Stay: Payer: PPO | Attending: Internal Medicine

## 2024-02-20 ENCOUNTER — Ambulatory Visit (HOSPITAL_COMMUNITY)
Admission: RE | Admit: 2024-02-20 | Discharge: 2024-02-20 | Disposition: A | Discharge status: Home / Self Care | Source: Ambulatory Visit | Attending: Internal Medicine | Admitting: Internal Medicine

## 2024-02-20 DIAGNOSIS — Z902 Acquired absence of lung [part of]: Secondary | ICD-10-CM | POA: Diagnosis not present

## 2024-02-20 DIAGNOSIS — E1165 Type 2 diabetes mellitus with hyperglycemia: Secondary | ICD-10-CM

## 2024-02-20 DIAGNOSIS — M4727 Other spondylosis with radiculopathy, lumbosacral region: Secondary | ICD-10-CM

## 2024-02-20 DIAGNOSIS — C3490 Malignant neoplasm of unspecified part of unspecified bronchus or lung: Secondary | ICD-10-CM | POA: Diagnosis not present

## 2024-02-20 DIAGNOSIS — M4714 Other spondylosis with myelopathy, thoracic region: Secondary | ICD-10-CM

## 2024-02-20 DIAGNOSIS — I700 Atherosclerosis of aorta: Secondary | ICD-10-CM | POA: Diagnosis not present

## 2024-02-20 DIAGNOSIS — Z85118 Personal history of other malignant neoplasm of bronchus and lung: Secondary | ICD-10-CM | POA: Diagnosis not present

## 2024-02-20 LAB — CMP (CANCER CENTER ONLY)
ALT: 32 U/L (ref 0–44)
AST: 24 U/L (ref 15–41)
Albumin: 4.1 g/dL (ref 3.5–5.0)
Alkaline Phosphatase: 77 U/L (ref 38–126)
Anion gap: 8 (ref 5–15)
BUN: 14 mg/dL (ref 8–23)
CO2: 25 mmol/L (ref 22–32)
Calcium: 9.1 mg/dL (ref 8.9–10.3)
Chloride: 106 mmol/L (ref 98–111)
Creatinine: 0.99 mg/dL (ref 0.44–1.00)
GFR, Estimated: >60 mL/min (ref >60)
Glucose, Bld: 185 mg/dL — ABNORMAL HIGH (ref 70–99)
Potassium: 4.6 mmol/L (ref 3.5–5.1)
Sodium: 139 mmol/L (ref 135–145)
Total Bilirubin: 0.3 mg/dL (ref 0.0–1.2)
Total Protein: 6.9 g/dL (ref 6.5–8.1)

## 2024-02-20 LAB — CBC WITH DIFFERENTIAL (CANCER CENTER ONLY)
Abs Immature Granulocytes: 0.01 10*3/uL (ref 0.00–0.07)
Basophils Absolute: 0 10*3/uL (ref 0.0–0.1)
Basophils Relative: 0 %
Eosinophils Absolute: 0.1 10*3/uL (ref 0.0–0.5)
Eosinophils Relative: 1 %
HCT: 36.5 % (ref 36.0–46.0)
Hemoglobin: 11.9 g/dL — ABNORMAL LOW (ref 12.0–15.0)
Immature Granulocytes: 0 %
Lymphocytes Relative: 21 %
Lymphs Abs: 1 10*3/uL (ref 0.7–4.0)
MCH: 29.1 pg (ref 26.0–34.0)
MCHC: 32.6 g/dL (ref 30.0–36.0)
MCV: 89.2 fL (ref 80.0–100.0)
Monocytes Absolute: 0.5 10*3/uL (ref 0.1–1.0)
Monocytes Relative: 11 %
Neutro Abs: 3.1 10*3/uL (ref 1.7–7.7)
Neutrophils Relative %: 67 %
Platelet Count: 169 10*3/uL (ref 150–400)
RBC: 4.09 MIL/uL (ref 3.87–5.11)
RDW: 14.4 % (ref 11.5–15.5)
WBC Count: 4.7 10*3/uL (ref 4.0–10.5)
nRBC: 0 % (ref 0.0–0.2)

## 2024-02-20 MED ORDER — IOHEXOL 300 MG/ML  SOLN
75.0000 mL | Freq: Once | INTRAMUSCULAR | Status: AC | PRN
  Administered 2024-02-20: 75 mL via INTRAVENOUS

## 2024-02-20 MED ORDER — SODIUM CHLORIDE (PF) 0.9 % IJ SOLN
INTRAMUSCULAR | Status: AC
  Filled 2024-02-20: qty 50

## 2024-02-20 MED ORDER — METFORMIN HCL 500 MG PO TABS: 500.0000 mg | ORAL_TABLET | Freq: Two times a day (BID) | ORAL | 1 refills | Status: DC

## 2024-02-20 MED ORDER — GABAPENTIN 300 MG PO CAPS: 300.0000 mg | ORAL_CAPSULE | Freq: Two times a day (BID) | ORAL | 0 refills | Status: DC

## 2024-02-20 NOTE — Telephone Encounter (Unsigned)
 Copied from CRM [id number]. Topic: Clinical - Medication Refill >> Feb 20, 2024 10:54 AM [name] wrote: Medication:  metFORMIN  (GLUCOPHAGE ) 500 MG tablet  gabapentin  (NEURONTIN ) 300 MG capsule   Has the patient contacted their pharmacy? No  patient is changing pharmacies   This is the patient's preferred pharmacy:   [facility] [address] [place], [place] - [address] [place] [address] Phone: [phone] Fax: [phone]   Is this the correct pharmacy for this prescription? Yes If no, delete pharmacy and type the correct one.   Has the prescription been filled recently? No  Is the patient out of the medication? Yes  Has the patient been seen for an appointment in the last year OR does the patient have an upcoming appointment? Yes  Can we respond through MyChart? Yes  Agent: Please be advised that Rx refills may take up to 3 business days. We ask that you follow-up with your pharmacy.

## 2024-02-20 NOTE — Telephone Encounter (Signed)
 Spoke with patient regarding request for a CT scan of the abdomen and pelvis. Per Dr. Marguerita Shih, imaging of the abdomen and pelvis is not indicated for stage IIB non-small cell lung cancer. Patient voiced understanding.

## 2024-02-27 ENCOUNTER — Inpatient Hospital Stay: Payer: PPO | Admitting: Internal Medicine

## 2024-02-27 VITALS — BP 132/65 | HR 70 | Temp 97.6°F | Resp 18 | Ht 68.0 in | Wt 179.6 lb

## 2024-02-27 DIAGNOSIS — Z85118 Personal history of other malignant neoplasm of bronchus and lung: Secondary | ICD-10-CM | POA: Diagnosis not present

## 2024-02-27 DIAGNOSIS — C3490 Malignant neoplasm of unspecified part of unspecified bronchus or lung: Secondary | ICD-10-CM

## 2024-02-27 NOTE — Progress Notes (Signed)
 [facility] [phone]   [phone]  OFFICE PROGRESS NOTE  Megan John, NP [address] [name] [address]  DIAGNOSIS: Stage IIb (T3, N0, M0)  non-small cell lung cancer, adenocarcinoma presented with large left lower lobe lung mass diagnosed in August 2023  Molecular studies by foundation 1 showed no actionable mutation and she has negative PD-L1 expression.  PRIOR THERAPY:  1) Status post left lower lobectomy with lymph node sampling under the care of Dr. Deloise Ferries on June 16, 2022 with tumor size of 5.5 cm and visceropleural involvement. 2) Adjuvant systemic chemotherapy with carboplatin  for AUC of 5 and Alimta 500 Mg/M2.  First dose August 09, 2022.  Status post 4 cycles.  CURRENT THERAPY: Observation.  INTERVAL HISTORY: Megan [name] 73 y.o. female returns to the clinic today for follow-up visit accompanied by her husband.Discussed the use of AI scribe software for clinical note transcription with the patient, who gave verbal consent to proceed.  History of Present Illness   Megan [name] is a 73 year old female who presents for evaluation and repeat CT scan of the chest for restaging of her disease. She is accompanied by her husband.  She has been undergoing evaluation and treatment for her condition since February 2024. No current issues or complaints, including no chest pain, shortness of breath, cough, or hemoptysis.  She occasionally experiences nausea but has no vomiting or diarrhea. No headaches or other bothersome symptoms.  Her medication regimen includes three medications, although specific names, doses, and frequencies were not discussed.         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    Constipation due to pain medication    Debility    Depression    Dyspnea    W/ PHYS CONDITION    Emphysema of lung (HCC)    Foul smelling urine 07/22/2020   GERD (gastroesophageal reflux disease)    History of bronchitis    not since I quit smoking (07/26/2017)   History of kidney stones    History of shingles    Hyperlipidemia    takes Fish Oil daily   Hypertension    Hypoalbuminemia due to protein-calorie malnutrition (HCC)    L3 vertebral fracture (HCC) 09/01/2017   Lung cancer (HCC) 2023   Lung nodule 04/21/2022   Lung nodule seen on imaging study 08/14/2021   Added automatically from request for surgery [id number]   Myelopathy (HCC) 12/21/2016   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  MEDICATIONS:  Current Outpatient Medications  Medication Sig Dispense Refill   acetaminophen  (TYLENOL ) 500 MG tablet Take 500 mg by mouth  every 8 (eight) hours as needed (for pain).     amLODipine  (NORVASC ) 10 MG tablet Take 1 tablet (10 mg total) by mouth daily. for blood pressure. 90 tablet 1   atorvastatin  (LIPITOR) 40 MG tablet Take 1 tablet (40 mg total) by mouth daily. for cholesterol. 90 tablet 1   baclofen  (LIORESAL ) 10 MG tablet Take 1 tablet (10 mg total) by mouth 3 (three) times daily. For muscle spasms. 270 each 1   Biotin  5000 MCG CAPS Take 5,000 mcg by mouth every morning.      cetirizine  (ZYRTEC ) 10  MG tablet Take 1 tablet (10 mg total) by mouth daily. For allergies 90 tablet 0   Cholecalciferol  (VITAMIN D ) 50 MCG (2000 UT) tablet Take 2,000 Units by mouth daily.     Coenzyme Q10 (COQ10) 100 MG CAPS Take 100 mg by mouth every evening.     esomeprazole  (NEXIUM  24HR) 20 MG capsule Take 1 capsule (20 mg total) by mouth daily at 12 noon. 90 capsule 1   gabapentin  (NEURONTIN ) 300 MG capsule Take 1 capsule (300 mg total) by mouth 2 (two) times daily. For back pain 180 capsule 0   glipiZIDE  (GLUCOTROL ) 5 MG tablet TAKE 1 TABLET(5 MG) BY MOUTH TWICE DAILY BEFORE A MEAL FOR DIABETES 180 tablet 0   lisinopril  (ZESTRIL ) 20 MG tablet Take 1 tablet (20 mg total) by mouth daily. for blood pressure. 90 tablet 1   metFORMIN  (GLUCOPHAGE ) 500 MG tablet Take 1 tablet (500 mg total) by mouth 2 (two) times daily with a meal. for diabetes. 180 tablet 1   nitrofurantoin , macrocrystal-monohydrate, (MACROBID ) 100 MG capsule Take 1 capsule (100 mg total) by mouth 2 (two) times daily. 10 capsule 0   nystatin-triamcinolone ointment (MYCOLOG) Apply 1 Application topically 2 (two) times daily as needed (irritation).     oxybutynin  (DITROPAN -XL) 5 MG 24 hr tablet Take 1 tablet (5 mg total) by mouth at bedtime. For overactive bladder 90 tablet 1   oxybutynin  (OXYTROL ) 3.9 MG/24HR Place 1 patch onto the skin See admin instructions. Change every 5 days     Polyethyl Glycol-Propyl Glycol (LUBRICANT EYE DROPS) 0.4-0.3 % SOLN Place 1-2 drops into both eyes 2 (two) times daily.     predniSONE  (DELTASONE ) 20 MG tablet Take two tablets my mouth once daily in the morning for four days, then one tablet once daily in the morning for four days. 12 tablet 0   senna-docusate (SENOKOT-S) 8.6-50 MG tablet Take 2 tablets by mouth 2 (two) times daily. (Patient taking differently: Take 1 tablet by mouth every other day.)     sulfamethoxazole -trimethoprim  (BACTRIM  DS) 800-160 MG tablet Take 1 tablet by mouth 2 (two) times daily. 6 tablet 0    XDEMVY 0.25 % SOLN Apply 1 drop to eye every 12 (twelve) hours.     No current facility-administered medications for this visit.    SURGICAL HISTORY:  Past Surgical History:  Procedure Laterality Date   ABDOMINAL EXPOSURE N/A 07/26/2017   Procedure: ABDOMINAL EXPOSURE;  Surgeon: Dannis Dy, MD;  Location[facility] OR;  Service: Vascular;  Laterality: N/A;   ANTERIOR CERVICAL DECOMP/DISCECTOMY FUSION  2001   ANTERIOR LUMBAR FUSION N/A 07/26/2017   Procedure: Lumbar five-Sacral one Anterior lumbar interbody fusion with Dr. Brayton Calin for approach;  Surgeon: Ditty, Raelene Bullocks, MD;  Location: [facility] OR;  Service: Neurosurgery;  Laterality: N/A;   APPLICATION OF ROBOTIC ASSISTANCE FOR SPINAL PROCEDURE  12/19/2016   Procedure: APPLICATION OF ROBOTIC ASSISTANCE FOR SPINAL PROCEDURE;  Surgeon: Raelene Bullocks Ditty, MD;  Location: [facility] OR;  Service: Neurosurgery;;   APPLICATION OF ROBOTIC ASSISTANCE FOR SPINAL PROCEDURE N/A 07/26/2017   Procedure: APPLICATION OF ROBOTIC ASSISTANCE FOR SPINAL PROCEDURE;  Surgeon: Ditty, Raelene Bullocks, MD;  Location[facility] OR;  Service: Neurosurgery;  Laterality: N/A;   APPLICATION OF ROBOTIC ASSISTANCE FOR SPINAL PROCEDURE N/A 09/02/2017   Procedure: APPLICATION OF ROBOTIC ASSISTANCE FOR SPINAL PROCEDURE;  Surgeon: Ditty, Raelene Bullocks, MD;  Location: [facility] OR;  Service: Neurosurgery;  Laterality: N/A;   BACK SURGERY     BLADDER SUSPENSION  1991   tack   BRONCHIAL BIOPSY  09/15/2021   Procedure: BRONCHIAL BIOPSIES;  Surgeon: Prudy Brownie, DO;  Location: [facility]COPY;  Service: Pulmonary;;   BRONCHIAL BIOPSY  05/04/2022   Procedure: BRONCHIAL BIOPSIES;  Surgeon: Prudy Brownie, DO;  Location: [facility];  Service: Pulmonary;;   BRONCHIAL BRUSHINGS  09/15/2021   Procedure: BRONCHIAL BRUSHINGS;  Surgeon: Prudy Brownie, DO;  Location: [facility];  Service: Pulmonary;;   BRONCHIAL NEEDLE ASPIRATION BIOPSY  09/15/2021   Procedure: BRONCHIAL NEEDLE  ASPIRATION BIOPSIES;  Surgeon: Prudy Brownie, DO;  Location: [facility];  Service: Pulmonary;;   BRONCHIAL NEEDLE ASPIRATION BIOPSY  05/04/2022   Procedure: BRONCHIAL NEEDLE ASPIRATION BIOPSIES;  Surgeon: Prudy Brownie, DO;  Location: [facility];  Service: Pulmonary;;   BRONCHIAL WASHINGS  09/15/2021   Procedure: BRONCHIAL WASHINGS;  Surgeon: Prudy Brownie, DO;  Location: [facility];  Service: Pulmonary;;   FIDUCIAL MARKER PLACEMENT  09/15/2021   Procedure: FIDUCIAL MARKER PLACEMENT;  Surgeon: Prudy Brownie, DO;  Location: [facility];  Service: Pulmonary;;   INGUINAL HERNIA REPAIR Right 1991   INTERCOSTAL NERVE BLOCK Left 06/16/2022   Procedure: INTERCOSTAL NERVE BLOCK;  Surgeon: Hilarie Lovely, MD;  Location: [facility] OR;  Service: Thoracic;  Laterality: Left;   LUMBAR FUSION  2016; 2017; 07/26/2017   L4-5; L2-3; L5-S1   LYMPH NODE DISSECTION Left 06/16/2022   Procedure: LYMPH NODE DISSECTION;  Surgeon: Hilarie Lovely, MD;  Location: [facility] OR;  Service: Thoracic;  Laterality: Left;   POSTERIOR LUMBAR FUSION 4 WITH HARDWARE REMOVAL N/A 09/02/2017   Procedure: Lumbar three-four redo laminectomy; Repositioning of Left Sacral two screw; Extension of lumbar fusion to Thoracic twelve;  Surgeon: Ditty, Raelene Bullocks, MD;  Location: [facility] OR;  Service: Neurosurgery;  Laterality: N/A;   TONSILLECTOMY AND ADENOIDECTOMY  1959   TUMOR EXCISION     WERTHIN'S TUMORS BOTH SIDES OF NECK   VAGINAL HYSTERECTOMY  1979   VIDEO BRONCHOSCOPY WITH RADIAL ENDOBRONCHIAL ULTRASOUND  09/15/2021   Procedure: VIDEO BRONCHOSCOPY WITH RADIAL ENDOBRONCHIAL ULTRASOUND;  Surgeon: Prudy Brownie, DO;  Location: [facility];  Service: Pulmonary;;   VIDEO BRONCHOSCOPY WITH RADIAL ENDOBRONCHIAL ULTRASOUND  05/04/2022   Procedure: VIDEO BRONCHOSCOPY WITH RADIAL ENDOBRONCHIAL ULTRASOUND;  Surgeon: Prudy Brownie, DO;  Location: [facility];  Service: Pulmonary;;    REVIEW OF SYSTEMS:  A comprehensive review of  systems was negative.   PHYSICAL EXAMINATION: General appearance: alert, cooperative, and no distress Head: Normocephalic, without obvious abnormality, atraumatic Neck: no adenopathy, no JVD, supple, symmetrical, trachea midline, and thyroid  not enlarged, symmetric, no tenderness/mass/nodules Lymph nodes: Cervical, supraclavicular, and axillary nodes normal. Resp: clear to auscultation bilaterally Back: symmetric, no curvature. ROM normal. No CVA tenderness. Cardio: regular rate and rhythm, S1, S2 normal, no murmur, click, rub or gallop GI: soft, non-tender; bowel sounds normal; no masses,  no organomegaly Extremities: extremities normal, atraumatic, no cyanosis or edema  ECOG PERFORMANCE STATUS: 1 - Symptomatic but completely ambulatory  Blood pressure 132/65, pulse 70, temperature 97.6 F (36.4 C), temperature source Temporal, resp. rate 18, height 5' 8 (1.727 m), weight 179 lb 9 oz (81.4 kg), SpO2 98%.  LABORATORY DATA: Lab Results  Component Value Date   WBC 4.7 02/20/2024   HGB 11.9 (L) 02/20/2024   HCT 36.5 02/20/2024   MCV 89.2 02/20/2024   PLT 169 02/20/2024      Chemistry      Component Value Date/Time   NA 139 02/20/2024 1603   K 4.6 02/20/2024 1603   CL 106 02/20/2024 1603   CO2 25 02/20/2024 1603   BUN 14 02/20/2024 1603   CREATININE 0.99 02/20/2024 1603   CREATININE 0.84 07/31/2021 1600      Component Value Date/Time   CALCIUM  9.1 02/20/2024 1603   ALKPHOS 77 02/20/2024 1603   AST 24 02/20/2024 1603   ALT 32 02/20/2024 1603   BILITOT 0.3 02/20/2024 1603       RADIOGRAPHIC STUDIES: CT Chest W Contrast Result Date: 02/24/2024 EXAM: CT CHEST WITH CONTRAST 02/20/2024 05:57:00 PM TECHNIQUE: CT of the chest was performed with the administration of intravenous contrast. Multiplanar reformatted images are provided for review. Automated exposure control, iterative reconstruction, and/or weight based adjustment of the mA/kV was utilized to reduce the radiation  dose to as low as reasonably achievable. COMPARISON: 08/26/2023 CLINICAL HISTORY: Non-small cell lung cancer (NSCLC), staging. Scan for staging of NSCLC. FINDINGS: MEDIASTINUM: Mild thoracic aortic atherosclerosis. Mild coronary atherosclerosis of the LAD. LYMPH NODES: No mediastinal, hilar or axillary lymphadenopathy. LUNGS AND PLEURA: Status post left lower lobectomy. Associated postsurgical changes posteriorly in the left lower hemithorax. No suspicious pulmonary nodules. SOFT TISSUES/BONES: Cervical spine fixation hardware. Lower thoracic / upper lumbar spine fixation hardware. Moderate degenerative changes of the mid / lower thoracic spine. UPPER ABDOMEN: Limited images of the upper abdomen demonstrates hepatic steatosis. IMPRESSION: 1. Status post left lower lobectomy. 2. No recurrent or metastatic disease. Electronically signed by: Zadie Herter MD 02/24/2024 11:44 PM EDT RP Workstation: [id number]    ASSESSMENT AND PLAN: This is a very pleasant 73 years old white female with Stage IIb (T3, N0, M0)  non-small cell lung cancer, adenocarcinoma presented with large left lower lobe lung mass diagnosed in August 2023 status post left lower lobectomy with lymph node sampling under the care of Dr. Deloise Ferries on June 16, 2022 with tumor size of 5.5 cm and visceropleural involvement. Molecular studies by foundation 1 showed no actionable mutation and she has negative PD-L1 expression. The patient started adjuvant systemic chemotherapy with carboplatin  for AUC of 5 and Alimta 500 Mg/M2 status post 4 cycles.  Last dose was given October 11, 2022.  The patient has been on observation and she is feeling fine with no concerning complaints. She had repeat CT scan of the chest performed recently.  I personally reviewed the scan and discussed the result with the patient and her husband.  PET scan showed no concerning findings for disease recurrence or metastasis. Assessment and Plan    Stage IIb (T3, N0, M0)   non-small cell lung cancer, adenocarcinoma presented with large left lower lobe lung mass diagnosed in August 2023  She is status post Status post left lower lobectomy with lymph node sampling under the care of Dr. Deloise Ferries on June 16, 2022 with tumor size of 5.5 cm and visceropleural involvement followed by Adjuvant systemic chemotherapy with carboplatin  for AUC of 5 and Alimta 500 Mg/M2.  First  dose August 09, 2022.  Status post 4 cycles. No evidence of recurrent or metastatic disease on recent chest CT scan. Laboratory results are within normal limits. She reports no symptoms such as chest pain, shortness of breath, cough, or hemoptysis. Occasional nausea is present but not significant. She is doing well post-surgery on the left side, with no current complaints. - Schedule follow-up appointment in six months for re-evaluation and monitoring.   She was advised to call immediately if she has any other concerning symptoms.  The patient voices understanding of current disease status and treatment options and is in agreement with the current care plan.  All questions were answered. The patient knows to call the clinic with any problems, questions or concerns. We can certainly see the patient much sooner if necessary.  The total time spent in the appointment was 20 minutes.  Disclaimer: This note was dictated with voice recognition software. Similar sounding words can inadvertently be transcribed and may not be corrected upon review.

## 2024-02-27 NOTE — Addendum Note (Signed)
 Addended by: Bonnie Butters on: 02/27/2024 04:14 PM   Modules accepted: Orders

## 2024-03-13 DIAGNOSIS — B880 Other acariasis: Secondary | ICD-10-CM | POA: Diagnosis not present

## 2024-03-15 ENCOUNTER — Other Ambulatory Visit: Payer: Self-pay | Admitting: Primary Care

## 2024-03-15 DIAGNOSIS — E1165 Type 2 diabetes mellitus with hyperglycemia: Secondary | ICD-10-CM

## 2024-03-20 DIAGNOSIS — M542 Cervicalgia: Secondary | ICD-10-CM | POA: Diagnosis not present

## 2024-03-20 DIAGNOSIS — G8929 Other chronic pain: Secondary | ICD-10-CM | POA: Diagnosis not present

## 2024-03-20 DIAGNOSIS — Z6827 Body mass index (BMI) 27.0-27.9, adult: Secondary | ICD-10-CM | POA: Diagnosis not present

## 2024-03-20 DIAGNOSIS — M5441 Lumbago with sciatica, right side: Secondary | ICD-10-CM | POA: Diagnosis not present

## 2024-03-20 DIAGNOSIS — M546 Pain in thoracic spine: Secondary | ICD-10-CM | POA: Diagnosis not present

## 2024-03-22 ENCOUNTER — Other Ambulatory Visit (HOSPITAL_COMMUNITY): Payer: Self-pay | Admitting: Neurosurgery

## 2024-03-22 DIAGNOSIS — M5441 Lumbago with sciatica, right side: Secondary | ICD-10-CM

## 2024-03-22 DIAGNOSIS — M542 Cervicalgia: Secondary | ICD-10-CM

## 2024-03-22 DIAGNOSIS — M546 Pain in thoracic spine: Secondary | ICD-10-CM

## 2024-03-29 ENCOUNTER — Ambulatory Visit
Admission: RE | Admit: 2024-03-29 | Discharge: 2024-03-29 | Disposition: A | Discharge status: Home / Self Care | Source: Ambulatory Visit | Attending: Neurosurgery | Admitting: Neurosurgery

## 2024-03-29 DIAGNOSIS — M5441 Lumbago with sciatica, right side: Secondary | ICD-10-CM | POA: Insufficient documentation

## 2024-03-29 DIAGNOSIS — M4802 Spinal stenosis, cervical region: Secondary | ICD-10-CM | POA: Diagnosis not present

## 2024-03-29 DIAGNOSIS — M542 Cervicalgia: Secondary | ICD-10-CM | POA: Diagnosis not present

## 2024-03-29 DIAGNOSIS — M5134 Other intervertebral disc degeneration, thoracic region: Secondary | ICD-10-CM | POA: Diagnosis not present

## 2024-03-29 DIAGNOSIS — M546 Pain in thoracic spine: Secondary | ICD-10-CM | POA: Diagnosis not present

## 2024-03-29 DIAGNOSIS — M5124 Other intervertebral disc displacement, thoracic region: Secondary | ICD-10-CM | POA: Diagnosis not present

## 2024-03-29 DIAGNOSIS — M5126 Other intervertebral disc displacement, lumbar region: Secondary | ICD-10-CM | POA: Diagnosis not present

## 2024-04-10 DIAGNOSIS — M4712 Other spondylosis with myelopathy, cervical region: Secondary | ICD-10-CM | POA: Diagnosis not present

## 2024-04-19 ENCOUNTER — Other Ambulatory Visit: Payer: Self-pay | Admitting: Neurosurgery

## 2024-04-30 ENCOUNTER — Encounter (HOSPITAL_COMMUNITY): Payer: Self-pay

## 2024-04-30 NOTE — Progress Notes (Signed)
 PCP - Comer Gaskins, NP Cardiologist - Dr Linnie Rayas Oncology - Dr Sherrod Sherrod  CT Chest x-ray - 02/20/24 EKG - 05/01/24 Stress Test - 05/25/22 ECHO - n/a Cardiac Cath - n/a  ICD Pacemaker/Loop - n/a  Sleep Study -  n/a  Diabetes Type 2 Fasting Blood Sugar - 110-120s Checks Blood Sugar 1 times a day  Do not take Metformin  on the morning of surgery.  Do not take the night before or the  DOS dose of  Glipizide .      If your blood sugar is less than 70 mg/dL, you will need to treat for low blood sugar: Treat a low blood sugar (less than 70 mg/dL) with  cup of clear juice (cranberry or apple), 4 glucose tablets, OR glucose gel. Recheck blood sugar in 15 minutes after treatment (to make sure it is greater than 70 mg/dL). If your blood sugar is not greater than 70 mg/dL on recheck, call [phone] for further instructions.  Aspirin  & Blood Thinner Instructions:  n/a  NPO  Anesthesia review: Yes   STOP now taking any Aspirin  (unless otherwise instructed by your surgeon), Aleve, Naproxen, Ibuprofen, Motrin, Advil, Goody's, BC's, all herbal medications, fish oil, and all vitamins.   Coronavirus Screening Do you have any of the following symptoms:  Cough yes/no: No Fever (>100.37F)  yes/no: No Runny nose yes/no: No Sore throat yes/no: No Difficulty breathing/shortness of breath  yes/no: No  Have you traveled in the last 14 days and where? Yes, [place] 03/23/24-03/25/24.   Patient verbalized understanding of instructions that were given to them at the PAT appointment. Patient was also instructed that they will need to review over the PAT instructions again at home before surgery.

## 2024-04-30 NOTE — Progress Notes (Signed)
 Surgical Instructions   Your procedure is scheduled on Thursday May 03, 2024. Report to [facility] Main Entrance A at 8:30 A.M., then check in with the Admitting office. Any questions or running late day of surgery: call [phone]  Questions prior to your surgery date: call [phone], Monday-Friday, 8am-4pm. If you experience any cold or flu symptoms such as cough, fever, chills, shortness of breath, etc. between now and your scheduled surgery, please notify us  at the above number.     Remember:  Do not eat or drink  after midnight the night before your surgery  Take these medicines the morning of surgery with A SIP OF WATER  acetaminophen  (TYLENOL )  amLODipine  (NORVASC )  atorvastatin  (LIPITOR)  cetirizine  (ZYRTEC )  gabapentin  (NEURONTIN )    May take these medicines IF NEEDED: baclofen  (LIORESAL )    One week prior to surgery, STOP taking any Aspirin  (unless otherwise instructed by your surgeon) Aleve, Naproxen, Ibuprofen, Motrin, Advil, Goody's, BC's, all herbal medications, fish oil, and non-prescription vitamins.   WHAT DO I DO ABOUT MY DIABETES MEDICATION?   Do not take oral diabetes medicines (pills) the morning of surgery.        DO NOT TAKE YOUR metFORMIN  (GLUCOPHAGE ) THE MORNING OF SURGERY.         DO NOT TAKE YOUR glipiZIDE  (GLUCOTROL ) THE NIGHT BEFORE OR THE MORNING OF SURGERY.     The day of surgery, do not take other diabetes injectables, including Byetta (exenatide), Bydureon (exenatide ER), Victoza (liraglutide), or Trulicity (dulaglutide).  If your CBG is greater than 220 mg/dL, you may take  of your sliding scale (correction) dose of insulin .   HOW TO MANAGE YOUR DIABETES BEFORE AND AFTER SURGERY  Why is it important to control my blood sugar before and after surgery? Improving blood sugar levels before and after surgery helps healing and can limit problems. A way of improving blood sugar control is eating a healthy diet by:  Eating less  sugar and carbohydrates  Increasing activity/exercise  Talking with your doctor about reaching your blood sugar goals High blood sugars (greater than 180 mg/dL) can raise your risk of infections and slow your recovery, so you will need to focus on controlling your diabetes during the weeks before surgery. Make sure that the doctor who takes care of your diabetes knows about your planned surgery including the date and location.  How do I manage my blood sugar before surgery? Check your blood sugar at least 4 times a day, starting 2 days before surgery, to make sure that the level is not too high or low.  Check your blood sugar the morning of your surgery when you wake up and every 2 hours until you get to the Short Stay unit.  If your blood sugar is less than 70 mg/dL, you will need to treat for low blood sugar: Do not take insulin . Treat a low blood sugar (less than 70 mg/dL) with  cup of clear juice (cranberry or apple), 4 glucose tablets, OR glucose gel. Recheck blood sugar in 15 minutes after treatment (to make sure it is greater than 70 mg/dL). If your blood sugar is not greater than 70 mg/dL on recheck, call [phone] for further instructions. Report your blood sugar to the short stay nurse when you get to Short Stay.  If you are admitted to the hospital after surgery: Your blood sugar will be checked by the staff and you will probably be given insulin  after surgery (instead of oral diabetes medicines) to make  sure you have good blood sugar levels. The goal for blood sugar control after surgery is 80-180 mg/dL.                      Do NOT Smoke (Tobacco/Vaping) for 24 hours prior to your procedure.  If you use a CPAP at night, you may bring your mask/headgear for your overnight stay.   You will be asked to remove any contacts, glasses, piercing's, hearing aid's, dentures/partials prior to surgery. Please bring cases for these items if needed.    Patients discharged the day of  surgery will not be allowed to drive home, and someone needs to stay with them for 24 hours.  SURGICAL WAITING ROOM VISITATION Patients may have no more than 2 support people in the waiting area - these visitors may rotate.   Pre-op nurse will coordinate an appropriate time for 1 ADULT support person, who may not rotate, to accompany patient in pre-op.  Children under the age of 27 must have an adult with them who is not the patient and must remain in the main waiting area with an adult.  If the patient needs to stay at the hospital during part of their recovery, the visitor guidelines for inpatient rooms apply.  Please refer to the [facility] website for the visitor guidelines for any additional information.   If you received a COVID test during your pre-op visit  it is requested that you wear a mask when out in public, stay away from anyone that may not be feeling well and notify your surgeon if you develop symptoms. If you have been in contact with anyone that has tested positive in the last 10 days please notify you surgeon.      Pre-operative 5 CHG Bathing Instructions   You can play a key role in reducing the risk of infection after surgery. Your skin needs to be as free of germs as possible. You can reduce the number of germs on your skin by washing with CHG (chlorhexidine  gluconate) soap before surgery. CHG is an antiseptic soap that kills germs and continues to kill germs even after washing.   DO NOT use if you have an allergy to chlorhexidine /CHG or antibacterial soaps. If your skin becomes reddened or irritated, stop using the CHG and notify one of our RNs at [phone].   Please shower with the CHG soap starting 4 days before surgery using the following schedule:     Please keep in mind the following:  DO NOT shave, including legs and underarms, starting the day of your first shower.   Place clean sheets on your bed the day you start using CHG soap. Use a clean washcloth  (not used since being washed) for each shower. DO NOT sleep with pets once you start using the CHG.   CHG Shower Instructions:  Wash your face and private area with normal soap. If you choose to wash your hair, wash first with your normal shampoo.  After you use shampoo/soap, rinse your hair and body thoroughly to remove shampoo/soap residue.  Turn the water OFF and apply about 3 tablespoons (45 ml) of CHG soap to a CLEAN washcloth.  Apply CHG soap ONLY FROM YOUR NECK DOWN TO YOUR TOES (washing for 3-5 minutes)  DO NOT use CHG soap on face, private areas, open wounds, or sores.  Pay special attention to the area where your surgery is being performed.  If you are having back surgery, having someone wash your  back for you may be helpful. Wait 2 minutes after CHG soap is applied, then you may rinse off the CHG soap.  Pat dry with a clean towel  Put on clean clothes/pajamas   If you choose to wear lotion, please use ONLY the CHG-compatible lotions that are listed below.  Additional instructions for the day of surgery: DO NOT APPLY any lotions, deodorants, or perfumes.   Do not bring valuables to the hospital. [facility] is not responsible for any belongings/valuables. Do not wear nail polish, gel polish, artificial nails, or any other type of covering on natural nails (fingers and toes) Do not wear jewelry or makeup Put on clean/comfortable clothes.  Please brush your teeth.  Ask your nurse before applying any prescription medications to the skin.     CHG Compatible Lotions   Aveeno Moisturizing lotion  Cetaphil Moisturizing Cream  Cetaphil Moisturizing Lotion  Clairol Herbal Essence Moisturizing Lotion, Dry Skin  Clairol Herbal Essence Moisturizing Lotion, Extra Dry Skin  Clairol Herbal Essence Moisturizing Lotion, Normal Skin  Curel Age Defying Therapeutic Moisturizing Lotion with Alpha Hydroxy  Curel Extreme Care Body Lotion  Curel Soothing Hands Moisturizing Hand Lotion  Curel  Therapeutic Moisturizing Cream, Fragrance-Free  Curel Therapeutic Moisturizing Lotion, Fragrance-Free  Curel Therapeutic Moisturizing Lotion, Original Formula  Eucerin Daily Replenishing Lotion  Eucerin Dry Skin Therapy Plus Alpha Hydroxy Crme  Eucerin Dry Skin Therapy Plus Alpha Hydroxy Lotion  Eucerin Original Crme  Eucerin Original Lotion  Eucerin Plus Crme Eucerin Plus Lotion  Eucerin TriLipid Replenishing Lotion  Keri Anti-Bacterial Hand Lotion  Keri Deep Conditioning Original Lotion Dry Skin Formula Softly Scented  Keri Deep Conditioning Original Lotion, Fragrance Free Sensitive Skin Formula  Keri Lotion Fast Absorbing Fragrance Free Sensitive Skin Formula  Keri Lotion Fast Absorbing Softly Scented Dry Skin Formula  Keri Original Lotion  Keri Skin Renewal Lotion Keri Silky Smooth Lotion  Keri Silky Smooth Sensitive Skin Lotion  Nivea Body Creamy Conditioning Oil  Nivea Body Extra Enriched Lotion  Nivea Body Original Lotion  Nivea Body Sheer Moisturizing Lotion Nivea Crme  Nivea Skin Firming Lotion  NutraDerm 30 Skin Lotion  NutraDerm Skin Lotion  NutraDerm Therapeutic Skin Cream  NutraDerm Therapeutic Skin Lotion  ProShield Protective Hand Cream  Provon moisturizing lotion  Please read over the following fact sheets that you were given.

## 2024-05-01 ENCOUNTER — Other Ambulatory Visit: Payer: Self-pay

## 2024-05-01 ENCOUNTER — Encounter (HOSPITAL_COMMUNITY): Payer: Self-pay

## 2024-05-01 ENCOUNTER — Inpatient Hospital Stay (HOSPITAL_COMMUNITY)
Admission: RE | Admit: 2024-05-01 | Discharge: 2024-05-01 | Discharge status: Home / Self Care | Source: Ambulatory Visit | Attending: Neurosurgery

## 2024-05-01 VITALS — BP 120/66 | HR 80 | Temp 98.5°F | Resp 18 | Ht 68.0 in | Wt 178.0 lb

## 2024-05-01 DIAGNOSIS — E119 Type 2 diabetes mellitus without complications: Secondary | ICD-10-CM | POA: Insufficient documentation

## 2024-05-01 DIAGNOSIS — I10 Essential (primary) hypertension: Secondary | ICD-10-CM | POA: Insufficient documentation

## 2024-05-01 DIAGNOSIS — Z902 Acquired absence of lung [part of]: Secondary | ICD-10-CM | POA: Insufficient documentation

## 2024-05-01 DIAGNOSIS — Z7984 Long term (current) use of oral hypoglycemic drugs: Secondary | ICD-10-CM | POA: Diagnosis not present

## 2024-05-01 DIAGNOSIS — Z87891 Personal history of nicotine dependence: Secondary | ICD-10-CM | POA: Diagnosis not present

## 2024-05-01 DIAGNOSIS — Z01818 Encounter for other preprocedural examination: Secondary | ICD-10-CM | POA: Insufficient documentation

## 2024-05-01 DIAGNOSIS — G2581 Restless legs syndrome: Secondary | ICD-10-CM | POA: Diagnosis not present

## 2024-05-01 DIAGNOSIS — E785 Hyperlipidemia, unspecified: Secondary | ICD-10-CM | POA: Diagnosis not present

## 2024-05-01 DIAGNOSIS — I445 Left posterior fascicular block: Secondary | ICD-10-CM | POA: Insufficient documentation

## 2024-05-01 DIAGNOSIS — Z880 Allergy status to penicillin: Secondary | ICD-10-CM | POA: Diagnosis not present

## 2024-05-01 DIAGNOSIS — K219 Gastro-esophageal reflux disease without esophagitis: Secondary | ICD-10-CM | POA: Insufficient documentation

## 2024-05-01 DIAGNOSIS — M4722 Other spondylosis with radiculopathy, cervical region: Secondary | ICD-10-CM | POA: Diagnosis not present

## 2024-05-01 DIAGNOSIS — Z833 Family history of diabetes mellitus: Secondary | ICD-10-CM | POA: Diagnosis not present

## 2024-05-01 DIAGNOSIS — Z85118 Personal history of other malignant neoplasm of bronchus and lung: Secondary | ICD-10-CM | POA: Insufficient documentation

## 2024-05-01 DIAGNOSIS — M4712 Other spondylosis with myelopathy, cervical region: Secondary | ICD-10-CM | POA: Diagnosis not present

## 2024-05-01 DIAGNOSIS — G8220 Paraplegia, unspecified: Secondary | ICD-10-CM | POA: Diagnosis not present

## 2024-05-01 DIAGNOSIS — J439 Emphysema, unspecified: Secondary | ICD-10-CM | POA: Diagnosis not present

## 2024-05-01 HISTORY — DX: Tinnitus, left ear: H93.12

## 2024-05-01 HISTORY — DX: Dependence on other enabling machines and devices: Z99.89

## 2024-05-01 HISTORY — DX: Personal history of other medical treatment: Z92.89

## 2024-05-01 LAB — COMPREHENSIVE METABOLIC PANEL WITH GFR
ALT: 36 U/L (ref 0–44)
AST: 30 U/L (ref 15–41)
Albumin: 4 g/dL (ref 3.5–5.0)
Alkaline Phosphatase: 84 U/L (ref 38–126)
Anion gap: 14 (ref 5–15)
BUN: 13 mg/dL (ref 8–23)
CO2: 21 mmol/L — ABNORMAL LOW (ref 22–32)
Calcium: 9.7 mg/dL (ref 8.9–10.3)
Chloride: 103 mmol/L (ref 98–111)
Creatinine, Ser: 1 mg/dL (ref 0.44–1.00)
GFR, Estimated: >60 mL/min (ref >60)
Glucose, Bld: 182 mg/dL — ABNORMAL HIGH (ref 70–99)
Potassium: 4.9 mmol/L (ref 3.5–5.1)
Sodium: 138 mmol/L (ref 135–145)
Total Bilirubin: 0.4 mg/dL (ref 0.0–1.2)
Total Protein: 7.3 g/dL (ref 6.5–8.1)

## 2024-05-01 LAB — CBC
HCT: 38.6 % (ref 36.0–46.0)
Hemoglobin: 12.5 g/dL (ref 12.0–15.0)
MCH: 29.2 pg (ref 26.0–34.0)
MCHC: 32.4 g/dL (ref 30.0–36.0)
MCV: 90.2 fL (ref 80.0–100.0)
Platelets: 208 K/uL (ref 150–400)
RBC: 4.28 MIL/uL (ref 3.87–5.11)
RDW: 14 % (ref 11.5–15.5)
WBC: 5.2 K/uL (ref 4.0–10.5)
nRBC: 0 % (ref 0.0–0.2)

## 2024-05-01 LAB — TYPE AND SCREEN
ABO/RH(D): A POS
Antibody Screen: NEGATIVE

## 2024-05-01 LAB — SURGICAL PCR SCREEN
MRSA, PCR: NEGATIVE
Staphylococcus aureus: NEGATIVE

## 2024-05-01 LAB — HEMOGLOBIN A1C
Hgb A1c MFr Bld: 6.5 % — ABNORMAL HIGH (ref 4.8–5.6)
Mean Plasma Glucose: 139.85 mg/dL

## 2024-05-01 LAB — GLUCOSE, CAPILLARY: Glucose-Capillary: 197 mg/dL — ABNORMAL HIGH (ref 70–99)

## 2024-05-02 NOTE — Progress Notes (Signed)
 Anesthesia Chart Review:  73 year old female with pertinent history including former smoker (52 pack years, quit 2007), PONV, GERD on PPI, non-insulin -dependent DM2, HTN, HLD, NSCLC s/p left lower lobectomy 06/2022 with adjuvant systemic chemotherapy - no evidence of recurrent or metastatic disease on recent imaging, s/p T10 to the pelvis thoracolumbar fusion 2018, remote history of C6-7 ACDF.SABRA  PFTs 05/31/2022 prior to left lower lobectomy were within normal limits.  Nuclear stress test 05/25/2022 was low risk.  Recent MR of the cervical spine showed severe spinal canal stenosis at C3-4, C4-5, and and C5-6.  Preop labs reviewed, glucose elevated 182, otherwise unremarkable.  Diabetes overall well-controlled with A1c 6.5.  EKG 05/01/2024: Normal sinus rhythm.  Rate 83. Left posterior fascicular block. Cannot rule out Anterior infarct , age undetermined  Nuclear stress test 05/25/2022:   The study is normal. The study is low risk.   No ST deviation was noted.   LV perfusion is normal.   Left ventricular function is normal. End diastolic cavity size is normal.   Prior study not available for comparison.     Lynwood Geofm RIGGERS [facility] Short Stay Center/Anesthesiology Phone [phone] 05/02/2024 10:20 AM

## 2024-05-02 NOTE — Anesthesia Preprocedure Evaluation (Signed)
 Anesthesia Evaluation  Patient identified by MRN, date of birth, ID band Patient awake    Reviewed: Allergy & Precautions, NPO status , Patient's Chart, lab work & pertinent test results, reviewed documented beta blocker date and time   History of Anesthesia Complications (+) PONV and history of anesthetic complications  Airway Mallampati: III  TM Distance: >3 FB     Dental  (+) Edentulous Upper, Edentulous Lower   Pulmonary shortness of breath and with exertion, pneumonia, resolved, COPD,  COPD inhaler, former smoker   breath sounds clear to auscultation + decreased breath sounds      Cardiovascular hypertension, Pt. on medications Normal cardiovascular exam Rhythm:Regular Rate:Normal     Neuro/Psych  PSYCHIATRIC DISORDERS  Depression    Diabetic peripheral neuropathy Incomplete paraplegia Weakness both lower extremities Uses a walker Hx/o Thoracic myelopathy  Neuromuscular disease    GI/Hepatic Neg liver ROS,GERD  Medicated,,Neurogenic bowel   Endo/Other  diabetes, Well Controlled, Type 2, Oral Hypoglycemic Agents  Hyperlipidemia  Renal/GU negative Renal ROS  negative genitourinary   Musculoskeletal  (+) Arthritis , Osteoarthritis,  Cervical spondylosis with myelopathy and radiculopathy   Abdominal   Peds  Hematology  (+) Blood dyscrasia, anemia   Anesthesia Other Findings   Reproductive/Obstetrics                              Anesthesia Physical Anesthesia Plan  ASA: 3  Anesthesia Plan: General   Post-op Pain Management: Dilaudid  IV, Ofirmev  IV (intra-op)* and Precedex   Induction: Intravenous  PONV Risk Score and Plan: 4 or greater and Treatment may vary due to age or medical condition, Propofol  infusion, TIVA and Ondansetron   Airway Management Planned: Oral ETT and Video Laryngoscope Planned  Additional Equipment: None and ClearSight  Intra-op Plan:   Post-operative  Plan: Extubation in OR  Informed Consent: I have reviewed the patients History and Physical, chart, labs and discussed the procedure including the risks, benefits and alternatives for the proposed anesthesia with the patient or authorized representative who has indicated his/her understanding and acceptance.     Dental advisory given  Plan Discussed with: CRNA and Anesthesiologist  Anesthesia Plan Comments: (PAT note by Lynwood Hope, PA-C: 73 year old female with pertinent history including former smoker (52 pack years, quit 2007), PONV, GERD on PPI, non-insulin -dependent DM2, HTN, HLD, NSCLC s/p left lower lobectomy 06/2022 with adjuvant systemic chemotherapy - no evidence of recurrent or metastatic disease on recent imaging, s/p T10 to the pelvis thoracolumbar fusion 2018, remote history of C6-7 ACDF.SABRA  PFTs 05/31/2022 prior to left lower lobectomy were within normal limits.  Nuclear stress test 05/25/2022 was low risk.  Recent MR of the cervical spine showed severe spinal canal stenosis at C3-4, C4-5, and and C5-6.  Preop labs reviewed, glucose elevated 182, otherwise unremarkable.  Diabetes overall well-controlled with A1c 6.5.  EKG 05/01/2024: Normal sinus rhythm.  Rate 83. Left posterior fascicular block. Cannot rule out Anterior infarct , age undetermined  Nuclear stress test 05/25/2022:   The study is normal. The study is low risk.   No ST deviation was noted.   LV perfusion is normal.   Left ventricular function is normal. End diastolic cavity size is normal.   Prior study not available for comparison.  )         Anesthesia Quick Evaluation

## 2024-05-03 ENCOUNTER — Inpatient Hospital Stay (HOSPITAL_COMMUNITY): Payer: Self-pay | Admitting: Physician Assistant

## 2024-05-03 ENCOUNTER — Encounter (HOSPITAL_COMMUNITY): Payer: Self-pay | Admitting: Neurosurgery

## 2024-05-03 ENCOUNTER — Other Ambulatory Visit: Payer: Self-pay

## 2024-05-03 ENCOUNTER — Inpatient Hospital Stay (HOSPITAL_COMMUNITY): Admitting: Anesthesiology

## 2024-05-03 ENCOUNTER — Inpatient Hospital Stay (HOSPITAL_COMMUNITY)
Admission: RE | Admit: 2024-05-03 | Discharge: 2024-05-04 | DRG: 472 | Disposition: A | Discharge status: Home / Self Care | Attending: Neurosurgery | Admitting: Neurosurgery

## 2024-05-03 ENCOUNTER — Encounter (HOSPITAL_COMMUNITY)
Admission: RE | Disposition: A | Discharge status: Home / Self Care | Payer: Self-pay | Source: Home / Self Care | Attending: Neurosurgery

## 2024-05-03 ENCOUNTER — Inpatient Hospital Stay (HOSPITAL_COMMUNITY)

## 2024-05-03 DIAGNOSIS — G2581 Restless legs syndrome: Secondary | ICD-10-CM | POA: Diagnosis present

## 2024-05-03 DIAGNOSIS — J439 Emphysema, unspecified: Secondary | ICD-10-CM | POA: Diagnosis present

## 2024-05-03 DIAGNOSIS — M4712 Other spondylosis with myelopathy, cervical region: Principal | ICD-10-CM | POA: Diagnosis present

## 2024-05-03 DIAGNOSIS — G992 Myelopathy in diseases classified elsewhere: Secondary | ICD-10-CM | POA: Diagnosis not present

## 2024-05-03 DIAGNOSIS — Z833 Family history of diabetes mellitus: Secondary | ICD-10-CM | POA: Diagnosis not present

## 2024-05-03 DIAGNOSIS — Z472 Encounter for removal of internal fixation device: Secondary | ICD-10-CM | POA: Diagnosis not present

## 2024-05-03 DIAGNOSIS — Z902 Acquired absence of lung [part of]: Secondary | ICD-10-CM

## 2024-05-03 DIAGNOSIS — Z87891 Personal history of nicotine dependence: Secondary | ICD-10-CM | POA: Diagnosis not present

## 2024-05-03 DIAGNOSIS — Z8619 Personal history of other infectious and parasitic diseases: Secondary | ICD-10-CM

## 2024-05-03 DIAGNOSIS — Z880 Allergy status to penicillin: Secondary | ICD-10-CM | POA: Diagnosis not present

## 2024-05-03 DIAGNOSIS — Z825 Family history of asthma and other chronic lower respiratory diseases: Secondary | ICD-10-CM | POA: Diagnosis not present

## 2024-05-03 DIAGNOSIS — Z8701 Personal history of pneumonia (recurrent): Secondary | ICD-10-CM

## 2024-05-03 DIAGNOSIS — N3281 Overactive bladder: Secondary | ICD-10-CM | POA: Diagnosis present

## 2024-05-03 DIAGNOSIS — Z90710 Acquired absence of both cervix and uterus: Secondary | ICD-10-CM | POA: Diagnosis not present

## 2024-05-03 DIAGNOSIS — I445 Left posterior fascicular block: Secondary | ICD-10-CM | POA: Diagnosis present

## 2024-05-03 DIAGNOSIS — Z885 Allergy status to narcotic agent status: Secondary | ICD-10-CM

## 2024-05-03 DIAGNOSIS — E119 Type 2 diabetes mellitus without complications: Secondary | ICD-10-CM | POA: Diagnosis present

## 2024-05-03 DIAGNOSIS — I10 Essential (primary) hypertension: Secondary | ICD-10-CM

## 2024-05-03 DIAGNOSIS — Z87442 Personal history of urinary calculi: Secondary | ICD-10-CM

## 2024-05-03 DIAGNOSIS — G8220 Paraplegia, unspecified: Secondary | ICD-10-CM | POA: Diagnosis not present

## 2024-05-03 DIAGNOSIS — E785 Hyperlipidemia, unspecified: Secondary | ICD-10-CM | POA: Diagnosis present

## 2024-05-03 DIAGNOSIS — M4722 Other spondylosis with radiculopathy, cervical region: Principal | ICD-10-CM | POA: Diagnosis present

## 2024-05-03 DIAGNOSIS — M4802 Spinal stenosis, cervical region: Secondary | ICD-10-CM | POA: Diagnosis not present

## 2024-05-03 DIAGNOSIS — Z91013 Allergy to seafood: Secondary | ICD-10-CM

## 2024-05-03 DIAGNOSIS — Z79899 Other long term (current) drug therapy: Secondary | ICD-10-CM

## 2024-05-03 DIAGNOSIS — Z7984 Long term (current) use of oral hypoglycemic drugs: Secondary | ICD-10-CM

## 2024-05-03 DIAGNOSIS — Z85118 Personal history of other malignant neoplasm of bronchus and lung: Secondary | ICD-10-CM | POA: Diagnosis not present

## 2024-05-03 DIAGNOSIS — Z8542 Personal history of malignant neoplasm of other parts of uterus: Secondary | ICD-10-CM

## 2024-05-03 DIAGNOSIS — K219 Gastro-esophageal reflux disease without esophagitis: Secondary | ICD-10-CM | POA: Diagnosis not present

## 2024-05-03 DIAGNOSIS — J449 Chronic obstructive pulmonary disease, unspecified: Secondary | ICD-10-CM

## 2024-05-03 DIAGNOSIS — Z981 Arthrodesis status: Secondary | ICD-10-CM | POA: Diagnosis not present

## 2024-05-03 LAB — GLUCOSE, CAPILLARY
Glucose-Capillary: 156 mg/dL — ABNORMAL HIGH (ref 70–99)
Glucose-Capillary: 180 mg/dL — ABNORMAL HIGH (ref 70–99)
Glucose-Capillary: 187 mg/dL — ABNORMAL HIGH (ref 70–99)
Glucose-Capillary: 226 mg/dL — ABNORMAL HIGH (ref 70–99)
Glucose-Capillary: 310 mg/dL — ABNORMAL HIGH (ref 70–99)

## 2024-05-03 SURGERY — ANTERIOR CERVICAL DECOMPRESSION/DISCECTOMY FUSION 3 LEVEL/HARDWARE REMOVAL
Anesthesia: General | Site: Spine Cervical

## 2024-05-03 MED ORDER — ROCURONIUM BROMIDE 10 MG/ML (PF) SYRINGE
PREFILLED_SYRINGE | INTRAVENOUS | Status: AC
Start: 2024-05-03 — End: 2024-05-03
  Filled 2024-05-03: qty 10

## 2024-05-03 MED ORDER — PHENYLEPHRINE HCL-NACL 20-0.9 MG/250ML-% IV SOLN
INTRAVENOUS | Status: DC | PRN
  Administered 2024-05-03: 20 ug/min via INTRAVENOUS

## 2024-05-03 MED ORDER — CYCLOBENZAPRINE HCL 10 MG PO TABS
10.0000 mg | ORAL_TABLET | Freq: Three times a day (TID) | ORAL | Status: DC | PRN
Start: 2024-05-03 — End: 2024-05-04
  Administered 2024-05-03: 10 mg via ORAL
  Filled 2024-05-03: qty 1

## 2024-05-03 MED ORDER — DEXAMETHASONE 4 MG PO TABS
4.0000 mg | ORAL_TABLET | Freq: Four times a day (QID) | ORAL | Status: AC
  Administered 2024-05-03: 4 mg via ORAL
  Filled 2024-05-03: qty 1

## 2024-05-03 MED ORDER — PROPOFOL 10 MG/ML IV BOLUS
INTRAVENOUS | Status: AC
  Filled 2024-05-03: qty 20

## 2024-05-03 MED ORDER — MENTHOL 3 MG MT LOZG
1.0000 | LOZENGE | OROMUCOSAL | Status: DC | PRN
  Administered 2024-05-04: 3 mg via ORAL
  Filled 2024-05-03: qty 9

## 2024-05-03 MED ORDER — LORATADINE 10 MG PO TABS
10.0000 mg | ORAL_TABLET | Freq: Every day | ORAL | Status: DC
  Administered 2024-05-03: 10 mg via ORAL
  Filled 2024-05-03 (×2): qty 1

## 2024-05-03 MED ORDER — ONDANSETRON HCL 4 MG/2ML IJ SOLN
INTRAMUSCULAR | Status: AC
Start: 2024-05-03 — End: 2024-05-03
  Filled 2024-05-03: qty 2

## 2024-05-03 MED ORDER — FENTANYL CITRATE (PF) 250 MCG/5ML IJ SOLN
INTRAMUSCULAR | Status: DC | PRN
  Administered 2024-05-03 (×5): 50 ug via INTRAVENOUS

## 2024-05-03 MED ORDER — LIDOCAINE 2% (20 MG/ML) 5 ML SYRINGE
INTRAMUSCULAR | Status: DC | PRN
  Administered 2024-05-03: 80 mg via INTRAVENOUS

## 2024-05-03 MED ORDER — MORPHINE SULFATE (PF) 2 MG/ML IV SOLN: 2.0000 mg | INTRAVENOUS | Status: DC | PRN

## 2024-05-03 MED ORDER — BUPIVACAINE-EPINEPHRINE (PF) 0.5% -1:200000 IJ SOLN
INTRAMUSCULAR | Status: DC | PRN
  Administered 2024-05-03: 10 mL

## 2024-05-03 MED ORDER — BISACODYL 10 MG RE SUPP: 10.0000 mg | Freq: Every day | RECTAL | Status: DC | PRN

## 2024-05-03 MED ORDER — ACETAMINOPHEN 10 MG/ML IV SOLN
INTRAVENOUS | Status: DC | PRN
Start: 2024-05-03 — End: 2024-05-03
  Administered 2024-05-03: 1000 mg via INTRAVENOUS

## 2024-05-03 MED ORDER — ONDANSETRON HCL 4 MG/2ML IJ SOLN: 4.0000 mg | Freq: Four times a day (QID) | INTRAMUSCULAR | Status: DC | PRN

## 2024-05-03 MED ORDER — ONDANSETRON HCL 4 MG/2ML IJ SOLN
INTRAMUSCULAR | Status: AC
  Filled 2024-05-03: qty 2

## 2024-05-03 MED ORDER — DOCUSATE SODIUM 100 MG PO CAPS
100.0000 mg | ORAL_CAPSULE | Freq: Two times a day (BID) | ORAL | Status: DC
  Administered 2024-05-03: 100 mg via ORAL
  Filled 2024-05-03 (×2): qty 1

## 2024-05-03 MED ORDER — ACETAMINOPHEN 325 MG PO TABS: 650.0000 mg | ORAL_TABLET | ORAL | Status: DC | PRN

## 2024-05-03 MED ORDER — TRAMADOL HCL 50 MG PO TABS
50.0000 mg | ORAL_TABLET | Freq: Four times a day (QID) | ORAL | Status: DC | PRN
  Administered 2024-05-03 – 2024-05-04 (×3): 50 mg via ORAL
  Filled 2024-05-03 (×3): qty 1

## 2024-05-03 MED ORDER — FENTANYL CITRATE (PF) 250 MCG/5ML IJ SOLN
INTRAMUSCULAR | Status: AC
  Filled 2024-05-03: qty 5

## 2024-05-03 MED ORDER — CEFAZOLIN SODIUM-DEXTROSE 2-4 GM/100ML-% IV SOLN
2.0000 g | Freq: Three times a day (TID) | INTRAVENOUS | Status: AC
  Administered 2024-05-03 – 2024-05-04 (×2): 2 g via INTRAVENOUS
  Filled 2024-05-03 (×2): qty 100

## 2024-05-03 MED ORDER — THROMBIN 5000 UNITS EX KIT
PACK | CUTANEOUS | Status: AC
  Filled 2024-05-03: qty 1

## 2024-05-03 MED ORDER — GLIPIZIDE 5 MG PO TABS
5.0000 mg | ORAL_TABLET | Freq: Every day | ORAL | Status: DC
  Administered 2024-05-04: 5 mg via ORAL
  Filled 2024-05-03: qty 1

## 2024-05-03 MED ORDER — DEXAMETHASONE SODIUM PHOSPHATE 10 MG/ML IJ SOLN
INTRAMUSCULAR | Status: AC
  Filled 2024-05-03: qty 1

## 2024-05-03 MED ORDER — ONDANSETRON HCL 4 MG PO TABS
4.0000 mg | ORAL_TABLET | Freq: Four times a day (QID) | ORAL | Status: DC | PRN
  Administered 2024-05-04: 4 mg via ORAL
  Filled 2024-05-03: qty 1

## 2024-05-03 MED ORDER — PHENOL 1.4 % MT LIQD: 1.0000 | OROMUCOSAL | Status: DC | PRN

## 2024-05-03 MED ORDER — CHLORHEXIDINE GLUCONATE 0.12 % MT SOLN
OROMUCOSAL | Status: AC
  Administered 2024-05-03: 15 mL
  Filled 2024-05-03: qty 15

## 2024-05-03 MED ORDER — INSULIN ASPART 100 UNIT/ML IJ SOLN
0.0000 [IU] | Freq: Three times a day (TID) | INTRAMUSCULAR | Status: DC
  Administered 2024-05-03: 15 [IU] via SUBCUTANEOUS
  Administered 2024-05-04: 11 [IU] via SUBCUTANEOUS

## 2024-05-03 MED ORDER — ALBUMIN HUMAN 5 % IV SOLN: INTRAVENOUS | Status: DC | PRN

## 2024-05-03 MED ORDER — 0.9 % SODIUM CHLORIDE (POUR BTL) OPTIME
TOPICAL | Status: DC | PRN
  Administered 2024-05-03: 1000 mL

## 2024-05-03 MED ORDER — LACTATED RINGERS IV SOLN: INTRAVENOUS | Status: DC | PRN

## 2024-05-03 MED ORDER — BACITRACIN ZINC 500 UNIT/GM EX OINT
TOPICAL_OINTMENT | CUTANEOUS | Status: AC
  Filled 2024-05-03: qty 28.35

## 2024-05-03 MED ORDER — CHLORHEXIDINE GLUCONATE CLOTH 2 % EX PADS: 6.0000 | MEDICATED_PAD | Freq: Once | CUTANEOUS | Status: DC

## 2024-05-03 MED ORDER — OXYBUTYNIN CHLORIDE ER 5 MG PO TB24
5.0000 mg | ORAL_TABLET | Freq: Every day | ORAL | Status: DC
Start: 2024-05-03 — End: 2024-05-04
  Administered 2024-05-03: 5 mg via ORAL
  Filled 2024-05-03: qty 1

## 2024-05-03 MED ORDER — ACETAMINOPHEN 500 MG PO TABS
1000.0000 mg | ORAL_TABLET | Freq: Four times a day (QID) | ORAL | Status: DC
  Administered 2024-05-03 – 2024-05-04 (×3): 1000 mg via ORAL
  Filled 2024-05-03 (×3): qty 2

## 2024-05-03 MED ORDER — ACETAMINOPHEN 650 MG RE SUPP: 650.0000 mg | RECTAL | Status: DC | PRN

## 2024-05-03 MED ORDER — ATORVASTATIN CALCIUM 40 MG PO TABS
40.0000 mg | ORAL_TABLET | Freq: Every day | ORAL | Status: DC
  Administered 2024-05-03: 40 mg via ORAL
  Filled 2024-05-03: qty 1

## 2024-05-03 MED ORDER — METFORMIN HCL 500 MG PO TABS
500.0000 mg | ORAL_TABLET | Freq: Two times a day (BID) | ORAL | Status: DC
  Administered 2024-05-03 – 2024-05-04 (×2): 500 mg via ORAL
  Filled 2024-05-03 (×2): qty 1

## 2024-05-03 MED ORDER — AMISULPRIDE (ANTIEMETIC) 5 MG/2ML IV SOLN
INTRAVENOUS | Status: AC
  Filled 2024-05-03: qty 4

## 2024-05-03 MED ORDER — PROPOFOL 500 MG/50ML IV EMUL
INTRAVENOUS | Status: DC | PRN
Start: 2024-05-03 — End: 2024-05-03
  Administered 2024-05-03: 130 ug/kg/min via INTRAVENOUS
  Administered 2024-05-03: 150 ug/kg/min via INTRAVENOUS

## 2024-05-03 MED ORDER — DEXAMETHASONE SODIUM PHOSPHATE 10 MG/ML IJ SOLN
INTRAMUSCULAR | Status: DC | PRN
  Administered 2024-05-03: 10 mg via INTRAVENOUS

## 2024-05-03 MED ORDER — VANCOMYCIN HCL IN DEXTROSE 1-5 GM/200ML-% IV SOLN
1000.0000 mg | INTRAVENOUS | Status: AC
  Administered 2024-05-03: 1000 mg via INTRAVENOUS
  Filled 2024-05-03: qty 200

## 2024-05-03 MED ORDER — ONDANSETRON HCL 4 MG/2ML IJ SOLN
INTRAMUSCULAR | Status: DC | PRN
  Administered 2024-05-03 (×2): 4 mg via INTRAVENOUS

## 2024-05-03 MED ORDER — ROCURONIUM BROMIDE 10 MG/ML (PF) SYRINGE
PREFILLED_SYRINGE | INTRAVENOUS | Status: AC
  Filled 2024-05-03: qty 10

## 2024-05-03 MED ORDER — HYDROMORPHONE HCL 1 MG/ML IJ SOLN: 0.2500 mg | INTRAMUSCULAR | Status: DC | PRN

## 2024-05-03 MED ORDER — ROCURONIUM BROMIDE 10 MG/ML (PF) SYRINGE
PREFILLED_SYRINGE | INTRAVENOUS | Status: DC | PRN
  Administered 2024-05-03: 10 mg via INTRAVENOUS
  Administered 2024-05-03: 20 mg via INTRAVENOUS
  Administered 2024-05-03: 60 mg via INTRAVENOUS
  Administered 2024-05-03: 10 mg via INTRAVENOUS
  Administered 2024-05-03: 20 mg via INTRAVENOUS
  Administered 2024-05-03: 10 mg via INTRAVENOUS

## 2024-05-03 MED ORDER — BACLOFEN 10 MG PO TABS
10.0000 mg | ORAL_TABLET | Freq: Three times a day (TID) | ORAL | Status: DC
  Administered 2024-05-03 – 2024-05-04 (×3): 10 mg via ORAL
  Filled 2024-05-03 (×4): qty 1

## 2024-05-03 MED ORDER — ACETAMINOPHEN 10 MG/ML IV SOLN
INTRAVENOUS | Status: AC
  Filled 2024-05-03: qty 100

## 2024-05-03 MED ORDER — GABAPENTIN 300 MG PO CAPS
300.0000 mg | ORAL_CAPSULE | Freq: Two times a day (BID) | ORAL | Status: DC
  Administered 2024-05-03: 300 mg via ORAL
  Filled 2024-05-03 (×2): qty 1

## 2024-05-03 MED ORDER — ALUM & MAG HYDROXIDE-SIMETH 200-200-20 MG/5ML PO SUSP: 30.0000 mL | Freq: Four times a day (QID) | ORAL | Status: DC | PRN

## 2024-05-03 MED ORDER — HYDROMORPHONE HCL 1 MG/ML IJ SOLN
INTRAMUSCULAR | Status: DC | PRN
  Administered 2024-05-03 (×2): .25 mg via INTRAVENOUS

## 2024-05-03 MED ORDER — THROMBIN 5000 UNITS EX SOLR
OROMUCOSAL | Status: DC | PRN
  Administered 2024-05-03: 5 mL via TOPICAL

## 2024-05-03 MED ORDER — BACITRACIN ZINC 500 UNIT/GM EX OINT
TOPICAL_OINTMENT | CUTANEOUS | Status: DC | PRN
  Administered 2024-05-03: 1 via TOPICAL

## 2024-05-03 MED ORDER — PROPOFOL 10 MG/ML IV BOLUS
INTRAVENOUS | Status: DC | PRN
  Administered 2024-05-03: 120 mg via INTRAVENOUS

## 2024-05-03 MED ORDER — SODIUM CHLORIDE 0.9 % IV SOLN: 12.5000 mg | INTRAVENOUS | Status: DC | PRN

## 2024-05-03 MED ORDER — DEXAMETHASONE SODIUM PHOSPHATE 4 MG/ML IJ SOLN
4.0000 mg | Freq: Four times a day (QID) | INTRAMUSCULAR | Status: AC
  Administered 2024-05-03: 4 mg via INTRAVENOUS
  Filled 2024-05-03: qty 1

## 2024-05-03 MED ORDER — ZOLPIDEM TARTRATE 5 MG PO TABS: 5.0000 mg | ORAL_TABLET | Freq: Every evening | ORAL | Status: DC | PRN

## 2024-05-03 MED ORDER — LISINOPRIL 20 MG PO TABS
20.0000 mg | ORAL_TABLET | Freq: Every day | ORAL | Status: DC
  Administered 2024-05-03: 20 mg via ORAL
  Filled 2024-05-03 (×2): qty 1

## 2024-05-03 MED ORDER — INSULIN ASPART 100 UNIT/ML IJ SOLN: 0.0000 [IU] | INTRAMUSCULAR | Status: DC | PRN

## 2024-05-03 MED ORDER — AMLODIPINE BESYLATE 10 MG PO TABS
10.0000 mg | ORAL_TABLET | Freq: Every day | ORAL | Status: DC
  Administered 2024-05-03: 10 mg via ORAL
  Filled 2024-05-03: qty 1

## 2024-05-03 MED ORDER — LIDOCAINE 2% (20 MG/ML) 5 ML SYRINGE
INTRAMUSCULAR | Status: AC
  Filled 2024-05-03: qty 5

## 2024-05-03 MED ORDER — PANTOPRAZOLE SODIUM 40 MG PO TBEC
40.0000 mg | DELAYED_RELEASE_TABLET | Freq: Every day | ORAL | Status: DC
  Administered 2024-05-03 – 2024-05-04 (×2): 40 mg via ORAL
  Filled 2024-05-03 (×2): qty 1

## 2024-05-03 MED ORDER — BUPIVACAINE-EPINEPHRINE (PF) 0.5% -1:200000 IJ SOLN
INTRAMUSCULAR | Status: AC
  Filled 2024-05-03: qty 30

## 2024-05-03 MED ORDER — SUGAMMADEX SODIUM 200 MG/2ML IV SOLN
INTRAVENOUS | Status: DC | PRN
  Administered 2024-05-03: 200 mg via INTRAVENOUS

## 2024-05-03 MED ORDER — LACTATED RINGERS IV SOLN: INTRAVENOUS | Status: DC

## 2024-05-03 MED ORDER — COQ10 100 MG PO CAPS: 100.0000 mg | ORAL_CAPSULE | Freq: Every evening | ORAL | Status: DC

## 2024-05-03 MED ORDER — AMISULPRIDE (ANTIEMETIC) 5 MG/2ML IV SOLN
10.0000 mg | Freq: Once | INTRAVENOUS | Status: AC | PRN
  Administered 2024-05-03: 10 mg via INTRAVENOUS

## 2024-05-03 MED ORDER — INSULIN ASPART 100 UNIT/ML IJ SOLN
0.0000 [IU] | INTRAMUSCULAR | Status: DC
  Administered 2024-05-03: 7 [IU] via SUBCUTANEOUS

## 2024-05-03 MED ORDER — HYDROMORPHONE HCL 4 MG PO TABS: 4.0000 mg | ORAL_TABLET | ORAL | Status: DC | PRN

## 2024-05-03 MED ORDER — HYDROMORPHONE HCL 1 MG/ML IJ SOLN
INTRAMUSCULAR | Status: AC
  Filled 2024-05-03: qty 0.5

## 2024-05-03 SURGICAL SUPPLY — 52 items
BAG COUNTER SPONGE SURGICOUNT (BAG) ×2 IMPLANT
BAND RUBBER #18 3X1/16 STRL (MISCELLANEOUS) ×4 IMPLANT
BENZOIN TINCTURE PRP APPL 2/3 (GAUZE/BANDAGES/DRESSINGS) ×4 IMPLANT
BIT DRILL NEURO 2X3.1 SFT TUCH (MISCELLANEOUS) ×2 IMPLANT
BLADE SURG 15 STRL LF DISP TIS (BLADE) ×2 IMPLANT
BLADE ULTRA TIP 2M (BLADE) ×2 IMPLANT
BUR BARREL STRAIGHT FLUTE 4.0 (BURR) ×2 IMPLANT
BUR MATCHSTICK NEURO 3.0 LAGG (BURR) ×2 IMPLANT
CANISTER SUCTION 3000ML PPV (SUCTIONS) ×2 IMPLANT
CLSR STERI-STRIP ANTIMIC 1/2X4 (GAUZE/BANDAGES/DRESSINGS) IMPLANT
COVER MAYO STAND STRL (DRAPES) ×2 IMPLANT
DRAIN 10X20 3/4 PERF LF SIL ST (DRAIN) IMPLANT
DRAPE LAPAROTOMY 100X72 PEDS (DRAPES) ×2 IMPLANT
DRAPE MICROSCOPE SLANT 54X150 (MISCELLANEOUS) IMPLANT
DRAPE SURG 17X23 STRL (DRAPES) ×4 IMPLANT
DRSG OPSITE POSTOP 3X4 (GAUZE/BANDAGES/DRESSINGS) ×2 IMPLANT
DRSG OPSITE POSTOP 4X6 (GAUZE/BANDAGES/DRESSINGS) IMPLANT
ELECTRODE REM PT RTRN 9FT ADLT (ELECTROSURGICAL) ×2 IMPLANT
EVACUATOR SILICONE 100CC (DRAIN) IMPLANT
GAUZE 4X4 16PLY ~~LOC~~+RFID DBL (SPONGE) IMPLANT
GLOVE BIO SURGEON STRL SZ 6.5 (GLOVE) ×2 IMPLANT
GLOVE BIO SURGEON STRL SZ8 (GLOVE) ×2 IMPLANT
GLOVE BIO SURGEON STRL SZ8.5 (GLOVE) ×2 IMPLANT
GLOVE BIOGEL PI IND STRL 6.5 (GLOVE) ×2 IMPLANT
GLOVE EXAM NITRILE XL STR (GLOVE) IMPLANT
GOWN STRL REUS W/ TWL LRG LVL3 (GOWN DISPOSABLE) ×2 IMPLANT
GOWN STRL REUS W/ TWL XL LVL3 (GOWN DISPOSABLE) ×2 IMPLANT
HEMOSTAT POWDER KIT SURGIFOAM (HEMOSTASIS) ×2 IMPLANT
KIT BASIN OR (CUSTOM PROCEDURE TRAY) ×2 IMPLANT
KIT TURNOVER KIT B (KITS) ×2 IMPLANT
MARKER SKIN DUAL TIP RULER LAB (MISCELLANEOUS) ×2 IMPLANT
NDL HYPO 22X1.5 SAFETY MO (MISCELLANEOUS) ×2 IMPLANT
NDL SPNL 18GX3.5 QUINCKE PK (NEEDLE) ×2 IMPLANT
NEEDLE HYPO 22X1.5 SAFETY MO (MISCELLANEOUS) ×1 IMPLANT
NEEDLE SPNL 18GX3.5 QUINCKE PK (NEEDLE) ×1 IMPLANT
NS IRRIG 1000ML POUR BTL (IV SOLUTION) ×2 IMPLANT
PACK LAMINECTOMY NEURO (CUSTOM PROCEDURE TRAY) ×2 IMPLANT
PIN DISTRACTION 14MM (PIN) ×4 IMPLANT
PLATE SKYLINE 3LVL 45MM CERV (Plate) IMPLANT
PUTTY DBM 5CC CALC GRAN (Putty) IMPLANT
RASP 3.0MM (RASP) IMPLANT
SCREW VARIABLE SELF TAP 12MM (Screw) IMPLANT
SPACER CIF 6 4D SM (Spacer) IMPLANT
SPACER CIF 7 4D SM (Spacer) IMPLANT
SPONGE INTESTINAL PEANUT (DISPOSABLE) ×4 IMPLANT
SPONGE SURGIFOAM ABS GEL 100 (HEMOSTASIS) IMPLANT
STRIP CLOSURE SKIN 1/2X4 (GAUZE/BANDAGES/DRESSINGS) ×2 IMPLANT
SUT VIC AB 0 CT1 27XBRD ANTBC (SUTURE) ×2 IMPLANT
SUT VIC AB 3-0 SH 8-18 (SUTURE) ×2 IMPLANT
TOWEL GREEN STERILE (TOWEL DISPOSABLE) ×2 IMPLANT
TOWEL GREEN STERILE FF (TOWEL DISPOSABLE) ×2 IMPLANT
WATER STERILE IRR 1000ML POUR (IV SOLUTION) ×2 IMPLANT

## 2024-05-03 NOTE — H&P (Signed)
 Subjective: The patient is a 73 year old white female whose had multiple previous spine surgery by other physicians.  She has had a T9 to the ilium fusion by another physician.  She has had chronic lower extremity weakness.  She has developed worsening neck pain with bilateral arm pain numbness tingling weakness.  She failed medical management and was worked up with a cervical MRI which demonstrated significant stenosis at C3-4, C4-5 and C5-6.  I discussed the various treatment options with the patient.  She has decided to proceed with surgery.  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   Constipation due to pain medication    Debility    Depression    Dyspnea    with exertion   Emphysema of lung (HCC)    Foul smelling urine 07/22/2020   GERD (gastroesophageal reflux disease)    History of blood transfusion    with a surgical procedure   History of bronchitis    not since I quit smoking (07/26/2017)   History of kidney stones    passed stones   History of shingles    Hyperlipidemia    takes Fish Oil daily   Hypertension    Hypoalbuminemia due to protein-calorie malnutrition (HCC)    L3 vertebral fracture (HCC) 09/01/2017   Lung cancer (HCC) 2023   Lung nodule 04/21/2022   Lung nodule seen on imaging study 08/14/2021   Added automatically from request for surgery [id number]   Myelopathy (HCC) 12/21/2016   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   Neuromuscular disorder (HCC)    tingling/numbness right leg to toes   Paraparesis (HCC) 12/15/2016   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   Pneumonia 2009   PONV (postoperative nausea and vomiting)    Radiculopathy 07/28/2017   Restless leg    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   Spondylolisthesis of  lumbar region 06/10/2015   Tinnitus of left ear    Type 2 diabetes mellitus (HCC)    Uses roller walker    Uterine cancer (HCC) 1979   S/P hysterectomy   Weakness    numbness and tingling in both feet r/t back    Past Surgical History:  Procedure Laterality Date   ABDOMINAL EXPOSURE N/A 07/26/2017   Procedure: ABDOMINAL EXPOSURE;  Surgeon: Eliza Lonni RAMAN, MD;  Location: [facility] OR;  Service: Vascular;  Laterality: N/A;   ANTERIOR CERVICAL DECOMP/DISCECTOMY FUSION  2001   ANTERIOR LUMBAR FUSION N/A 07/26/2017   Procedure: Lumbar five-Sacral one Anterior lumbar interbody fusion with Dr. Lonni Eliza for approach;  Surgeon: Ditty, Morene Hicks, MD;  Location: [facility] OR;  Service: Neurosurgery;  Laterality: N/A;   APPLICATION OF ROBOTIC ASSISTANCE FOR SPINAL PROCEDURE  12/19/2016   Procedure: APPLICATION OF ROBOTIC ASSISTANCE FOR SPINAL PROCEDURE;  Surgeon: Morene Hicks Ditty, MD;  Location: [facility] OR;  Service: Neurosurgery;;   APPLICATION OF ROBOTIC ASSISTANCE FOR SPINAL PROCEDURE N/A 07/26/2017   Procedure: APPLICATION OF ROBOTIC ASSISTANCE FOR SPINAL PROCEDURE;  Surgeon: Ditty, Morene Hicks, MD;  Location: [facility] OR;  Service: Neurosurgery;  Laterality: N/A;   APPLICATION OF ROBOTIC ASSISTANCE FOR SPINAL PROCEDURE N/A 09/02/2017   Procedure: APPLICATION OF ROBOTIC ASSISTANCE FOR SPINAL PROCEDURE;  Surgeon: Ditty, Morene Hicks, MD;  Location: [facility] OR;  Service: Neurosurgery;  Laterality: N/A;   BACK SURGERY  BLADDER SUSPENSION  1991   tack   BRONCHIAL BIOPSY  09/15/2021   Procedure: BRONCHIAL BIOPSIES;  Surgeon: Brenna Adine CROME, DO;  Location: [facility];  Service: Pulmonary;;   BRONCHIAL BIOPSY  05/04/2022   Procedure: BRONCHIAL BIOPSIES;  Surgeon: Brenna Adine CROME, DO;  Location: [facility];  Service: Pulmonary;;   BRONCHIAL BRUSHINGS  09/15/2021   Procedure: BRONCHIAL BRUSHINGS;  Surgeon: Brenna Adine CROME, DO;  Location: [facility];  Service: Pulmonary;;   BRONCHIAL NEEDLE  ASPIRATION BIOPSY  09/15/2021   Procedure: BRONCHIAL NEEDLE ASPIRATION BIOPSIES;  Surgeon: Brenna Adine CROME, DO;  Location: [facility];  Service: Pulmonary;;   BRONCHIAL NEEDLE ASPIRATION BIOPSY  05/04/2022   Procedure: BRONCHIAL NEEDLE ASPIRATION BIOPSIES;  Surgeon: Brenna Adine CROME, DO;  Location: [facility];  Service: Pulmonary;;   BRONCHIAL WASHINGS  09/15/2021   Procedure: BRONCHIAL WASHINGS;  Surgeon: Brenna Adine CROME, DO;  Location: [facility];  Service: Pulmonary;;   FIDUCIAL MARKER PLACEMENT  09/15/2021   Procedure: FIDUCIAL MARKER PLACEMENT;  Surgeon: Brenna Adine CROME, DO;  Location: [facility];  Service: Pulmonary;;   INGUINAL HERNIA REPAIR Right 1991   INTERCOSTAL NERVE BLOCK Left 06/16/2022   Procedure: INTERCOSTAL NERVE BLOCK;  Surgeon: Shyrl Linnie KIDD, MD;  Location: [facility] OR;  Service: Thoracic;  Laterality: Left;   LUMBAR FUSION  2016; 2017; 07/26/2017   L4-5; L2-3; L5-S1   LYMPH NODE DISSECTION Left 06/16/2022   Procedure: LYMPH NODE DISSECTION;  Surgeon: Shyrl Linnie KIDD, MD;  Location: [facility] OR;  Service: Thoracic;  Laterality: Left;   POSTERIOR LUMBAR FUSION 4 WITH HARDWARE REMOVAL N/A 09/02/2017   Procedure: Lumbar three-four redo laminectomy; Repositioning of Left Sacral two screw; Extension of lumbar fusion to Thoracic twelve;  Surgeon: Ditty, Morene Hicks, MD;  Location: [facility] OR;  Service: Neurosurgery;  Laterality: N/A;   TONSILLECTOMY AND ADENOIDECTOMY  1959   TUMOR EXCISION     WERTHIN'S TUMORS BOTH SIDES OF NECK   VAGINAL HYSTERECTOMY  1979   VIDEO BRONCHOSCOPY WITH RADIAL ENDOBRONCHIAL ULTRASOUND  09/15/2021   Procedure: VIDEO BRONCHOSCOPY WITH RADIAL ENDOBRONCHIAL ULTRASOUND;  Surgeon: Brenna Adine CROME, DO;  Location: [facility];  Service: Pulmonary;;   VIDEO BRONCHOSCOPY WITH RADIAL ENDOBRONCHIAL ULTRASOUND  05/04/2022   Procedure: VIDEO BRONCHOSCOPY WITH RADIAL ENDOBRONCHIAL ULTRASOUND;  Surgeon: Brenna Adine CROME, DO;  Location: [facility];  Service:  Pulmonary;;    Allergies  Allergen Reactions   Shellfish Allergy Anaphylaxis, Swelling and Other (See Comments)    Tongue swells   Oxycodone  Nausea Only   Penicillins Rash and Other (See Comments)    Has patient had a PCN reaction causing immediate rash, facial/tongue/throat swelling, SOB or lightheadedness with hypotension: Yes Has patient had a PCN reaction causing severe rash involving mucus membranes or skin necrosis: No Has patient had a PCN reaction that required hospitalization No Has patient had a PCN reaction occurring within the last 10 years: No If all of the above answers are NO, then may proceed with Cephalosporin use.    Social History   Tobacco Use   Smoking status: Former    Current packs/day: 0.00    Average packs/day: 1.5 packs/day for 35.0 years (52.5 ttl pk-yrs)    Types: Cigarettes    Start date: 05/14/1971    Quit date: 05/13/2006    Years since quitting: 17.9   Smokeless tobacco: Never  Substance Use Topics   Alcohol  use: Yes    Alcohol /week: 1.0 - 2.0 standard drink of alcohol     Types: 1 - 2  Glasses of wine per week    Family History  Problem Relation Age of Onset   Diabetes Mother    Dementia Mother    Cirrhosis Mother        Non alcoholic   COPD Father    Diabetes Brother    Prior to Admission medications   Medication Sig Start Date End Date Taking? Authorizing Provider  acetaminophen  (TYLENOL ) 500 MG tablet Take 500 mg by mouth every 8 (eight) hours as needed for moderate pain (pain score 4-6).   Yes [provider]  amLODipine  (NORVASC ) 10 MG tablet Take 1 tablet (10 mg total) by mouth daily. for blood pressure. 01/16/24  Yes Gretta Comer POUR, NP  atorvastatin  (LIPITOR) 40 MG tablet Take 1 tablet (40 mg total) by mouth daily. for cholesterol. 12/29/23  Yes Clark, Katherine K, NP  baclofen  (LIORESAL ) 10 MG tablet Take 1 tablet (10 mg total) by mouth 3 (three) times daily. For muscle spasms. 11/28/23  Yes Gretta Comer POUR, NP  Biotin   5000 MCG CAPS Take 5,000 mcg by mouth every morning.    Yes [provider]  cetirizine  (ZYRTEC ) 10 MG tablet Take 1 tablet (10 mg total) by mouth daily. For allergies 01/21/21  Yes Clark, Katherine K, NP  Cholecalciferol  (VITAMIN D ) 50 MCG (2000 UT) tablet Take 2,000 Units by mouth daily.   Yes [provider]  clobetasol (TEMOVATE) 0.05 % external solution Apply 1 Application topically daily as needed (psoriasis). 02/14/24  Yes [provider]  Coenzyme Q10 (COQ10) 100 MG CAPS Take 100 mg by mouth every evening.   Yes [provider]  esomeprazole  (NEXIUM  24HR) 20 MG capsule Take 1 capsule (20 mg total) by mouth daily at 12 noon. 10/05/17  Yes Gretta Comer POUR, NP  gabapentin  (NEURONTIN ) 300 MG capsule Take 1 capsule (300 mg total) by mouth 2 (two) times daily. For back pain 02/20/24  Yes Gretta Comer POUR, NP  glipiZIDE  (GLUCOTROL ) 5 MG tablet TAKE 1 TABLET BY MOUTH TWICE DAILY BEFORE A MEAL FOR DIABETES 03/17/24  Yes Clark, Katherine K, NP  lisinopril  (ZESTRIL ) 20 MG tablet Take 1 tablet (20 mg total) by mouth daily. for blood pressure. 02/02/24  Yes Bedsole, Amy E, MD  metFORMIN  (GLUCOPHAGE ) 500 MG tablet Take 1 tablet (500 mg total) by mouth 2 (two) times daily with a meal. for diabetes. 02/20/24  Yes Clark, Katherine K, NP  nystatin-triamcinolone ointment [facility]) Apply 1 Application topically 2 (two) times daily as needed (irritation). 02/22/22  Yes [provider]  oxybutynin  (DITROPAN -XL) 5 MG 24 hr tablet Take 1 tablet (5 mg total) by mouth at bedtime. For overactive bladder 12/15/23  Yes Clark, Katherine K, NP  oxybutynin  (OXYTROL ) 3.9 MG/24HR Place 1 patch onto the skin See admin instructions. Change every 5 days   Yes [provider]  Polyethyl Glycol-Propyl Glycol (LUBRICANT EYE DROPS) 0.4-0.3 % SOLN Place 1-2 drops into both eyes 2 (two) times daily.   Yes [provider]  senna-docusate (SENOKOT-S) 8.6-50 MG tablet Take 2 tablets by  mouth 2 (two) times daily. Patient taking differently: Take 2 tablets by mouth every other day. 01/04/17  Yes Angiulli, Toribio PARAS, PA-C     Review of Systems  Positive ROS: As above  All other systems have been reviewed and were otherwise negative with the exception of those mentioned in the HPI and as above.  Objective: Vital signs in last 24 hours: Temp:  [98.7 F (37.1 C)] 98.7 F (37.1 C) (08/21 0826)  Pulse Rate:  [83] 83 (08/21 0826) Resp:  [18] 18 (08/21 0826) BP: (158)/(78) 158/78 (08/21 0826) SpO2:  [99 %] 99 % (08/21 0826) Weight:  [80.7 kg] 80.7 kg (08/21 0826) Estimated body mass index is 27.06 kg/m as calculated from the following:   Height as of this encounter: 5' 8 (1.727 m).   Weight as of this encounter: 80.7 kg.   General Appearance: Alert Head: Normocephalic, without obvious abnormality, atraumatic Eyes: PERRL, conjunctiva/corneas clear, EOM's intact,    Ears: Normal  Throat: Normal  Neck: Limited range of motion.  Her left anterior cervical incision is well-healed. Back: Her thoracic lumbar incision is well-healed. Lungs: Clear to auscultation bilaterally, respirations unlabored Heart: Regular rate and rhythm, no murmur, rub or gallop Abdomen: Soft, non-tender Extremities: Extremities normal, atraumatic, no cyanosis or edema Skin: unremarkable  NEUROLOGIC:   Mental status: alert and oriented,Motor Exam -she has chronic weakness in her right greater then left leg. Sensory Exam - grossly normal Reflexes: Absent in her lower extremities. Coordination - grossly normal Gait -she gets around in a motorized wheelchair. Balance -poor cranial Nerves: I: smell Not tested  II: visual acuity  OS: Normal  OD: Normal   II: visual fields Full to confrontation  II: pupils Equal, round, reactive to light  III,VII: ptosis None  III,IV,VI: extraocular muscles  Full ROM  V: mastication Normal  V: facial light touch sensation  Normal  V,VII: corneal reflex   Present  VII: facial muscle function - upper  Normal  VII: facial muscle function - lower Normal  VIII: hearing Not tested  IX: soft palate elevation  Normal  IX,X: gag reflex Present  XI: trapezius strength  5/5  XI: sternocleidomastoid strength 5/5  XI: neck flexion strength  5/5  XII: tongue strength  Normal    Data Review Lab Results  Component Value Date   WBC 5.2 05/01/2024   HGB 12.5 05/01/2024   HCT 38.6 05/01/2024   MCV 90.2 05/01/2024   PLT 208 05/01/2024   Lab Results  Component Value Date   NA 138 05/01/2024   K 4.9 05/01/2024   CL 103 05/01/2024   CO2 21 (L) 05/01/2024   BUN 13 05/01/2024   CREATININE 1.00 05/01/2024   GLUCOSE 182 (H) 05/01/2024   Lab Results  Component Value Date   INR 1.0 06/14/2022    Assessment/Plan: Cervical spondylosis, cervical stenosis, cervicalgia, cervical myelopathy, cervical radiculopathy: I have discussed the situation with the patient and her husband.  I reviewed her MRI scan with him and pointed out the abnormalities.  We have discussed the various treatment option including surgery.  I described the surgical treatment option of a C3-4, C4-5 and C5-6 anterior cervicectomy fusion plating with likely removal of the old plate at R3-2.  I have shown her surgical models.  I have given her surgical pamphlet.  We have discussed the risk, benefits, alternatives, expected postoperative course, and likelihood of achieving her goals with surgery.  I have answered all the patient's questions.  She has decided to proceed with surgery.   Megan [name] 05/03/2024 10:50 AM

## 2024-05-03 NOTE — Anesthesia Procedure Notes (Signed)
 Procedure Name: Intubation Date/Time: 05/03/2024 11:11 AM  Performed by: Shlomo Tinnie SAILOR, RNPre-anesthesia Checklist: Patient identified, Emergency Drugs available, Suction available and Patient being monitored Patient Re-evaluated:Patient Re-evaluated prior to induction Oxygen Delivery Method: Circle System Utilized Preoxygenation: Pre-oxygenation with 100% oxygen Induction Type: IV induction Ventilation: Mask ventilation without difficulty and Oral airway inserted - appropriate to patient size Laryngoscope Size: Glidescope and 3 Grade View: Grade I Tube type: Oral Tube size: 7.0 mm Number of attempts: 1 Airway Equipment and Method: Stylet and Oral airway Placement Confirmation: ETT inserted through vocal cords under direct vision, positive ETCO2 and breath sounds checked- equal and bilateral Secured at: 21 cm Tube secured with: Tape Dental Injury: Teeth and Oropharynx as per pre-operative assessment

## 2024-05-03 NOTE — Anesthesia Postprocedure Evaluation (Signed)
 Anesthesia Post Note  Patient: Megan [name]  Procedure(s) Performed: ANTERIOR CERVICAL DECOMPRESSION/DISCECTOMY FUSION CERVICAL THREE-FOUR, CERVICAL FOUR-FIVE, CERVICAL FIVE-SIX, REMOVAL OLD PLATE CERVICAL SIX-SEVEN (Spine Cervical)     Patient location during evaluation: PACU Anesthesia Type: General Level of consciousness: awake and alert and oriented Pain management: pain level controlled Vital Signs Assessment: post-procedure vital signs reviewed and stable Respiratory status: spontaneous breathing, nonlabored ventilation and respiratory function stable Cardiovascular status: blood pressure returned to baseline and stable Postop Assessment: no apparent nausea or vomiting Anesthetic complications: no   No notable events documented.  Last Vitals:  Vitals:   05/03/24 1515 05/03/24 1530  BP: (!) 154/74 (!) 153/76  Pulse: (!) 103 97  Resp: 12 13  Temp:  36.8 C  SpO2: 94% 94%    Last Pain:  Vitals:   05/03/24 1500  TempSrc:   PainSc: 0-No pain                 [name] A.

## 2024-05-03 NOTE — Progress Notes (Signed)
 Orthopedic Tech Progress Note Patient Details:  Megan [name] February 16, 1951 [id number] Delivered aspen collar to PACU Patient ID: Megan [name], female   DOB: [birth year], 73 y.o.   MRN: [id number]  Efrain DELENA Cos 05/03/2024, 4:05 PM

## 2024-05-03 NOTE — Transfer of Care (Signed)
 Immediate Anesthesia Transfer of Care Note  Patient: Megan [name]  Procedure(s) Performed: ANTERIOR CERVICAL DECOMPRESSION/DISCECTOMY FUSION CERVICAL THREE-FOUR, CERVICAL FOUR-FIVE, CERVICAL FIVE-SIX, REMOVAL OLD PLATE CERVICAL SIX-SEVEN (Spine Cervical)  Patient Location: PACU  Anesthesia Type:General  Level of Consciousness: drowsy and patient cooperative  Airway & Oxygen Therapy: Patient Spontanous Breathing and Patient connected to face mask oxygen  Post-op Assessment: Report given to RN, Post -op Vital signs reviewed and stable, and Patient moving all extremities  Post vital signs: Reviewed and stable  Last Vitals:  Vitals Value Taken Time  BP 161/79 05/03/24 15:00  Temp 99.1   Pulse 114 05/03/24 15:00  Resp 16 05/03/24 15:00  SpO2 93 % 05/03/24 15:00  Vitals shown include unfiled device data.  Last Pain:  Vitals:   05/03/24 0857  TempSrc:   PainSc: 0-No pain         Complications: No notable events documented.

## 2024-05-04 ENCOUNTER — Encounter (HOSPITAL_COMMUNITY): Payer: Self-pay | Admitting: Neurosurgery

## 2024-05-04 LAB — GLUCOSE, CAPILLARY: Glucose-Capillary: 259 mg/dL — ABNORMAL HIGH (ref 70–99)

## 2024-05-04 MED ORDER — TRAMADOL HCL 50 MG PO TABS: 50.0000 mg | ORAL_TABLET | Freq: Four times a day (QID) | ORAL | 0 refills | Status: DC | PRN

## 2024-05-04 MED ORDER — CYCLOBENZAPRINE HCL 5 MG PO TABS: 5.0000 mg | ORAL_TABLET | Freq: Three times a day (TID) | ORAL | Status: DC | PRN

## 2024-05-04 NOTE — Progress Notes (Signed)
 Patient alert and oriented, void, ambulate. Surgical site clean and dry no sign of infection. D/c instructions explain and given, all questions answered.

## 2024-05-04 NOTE — Evaluation (Signed)
 Occupational Therapy Evaluation Patient Details Name: Megan [name] MRN: [id number] DOB: Aug 08, 1951 Today's Date: 05/04/2024   History of Present Illness   Pt is as 73 y/o female presenting on 8/21 for ACDF 3-4, 4-5, 5-6 and removal of old plate R3-2.  PMH includes: arthritis, depression, emphysema of lung, L3 vertebral fx, lung cancer, DM2, uterine cancer, paraparesis, PONV, prior cervical and lumbar sx.     Clinical Impressions Patient admitted for above and presents with problem list below.  PTA pt was managing ADLs and mobility with modified independence, limited distance ambulating with rollator but using scooter vs wc as needed; managing ADLs and light IADLs from sitting position mostly. Patient was educated on cervical brace and wear schedule, cervical precautions, ADL compensatory techniques, AE/DME, mobility progression, safety and recommendations.  Today, pt demonstrated ability to complete bed mobility with supervision, transfers using RW with supervision, functional mobility using RW with supervision, and ADLs with up to supervision assist.  At discharge, pt will have support from initially 24/7 as needed.  Based on performance today, no further OT needs identified.  OT will sign off. Thank you for this referral!       If plan is discharge home, recommend the following:   A little help with walking and/or transfers;A little help with bathing/dressing/bathroom;Assistance with cooking/housework;Assist for transportation     Functional Status Assessment   Patient has had a recent decline in their functional status and demonstrates the ability to make significant improvements in function in a reasonable and predictable amount of time.     Equipment Recommendations   None recommended by OT     Recommendations for Other Services         Precautions/Restrictions   Precautions Precautions: Cervical Precaution Booklet Issued: Yes (comment) Recall of  Precautions/Restrictions: Intact Required Braces or Orthoses: Cervical Brace Cervical Brace: At all times;Hard collar Restrictions Weight Bearing Restrictions Per Provider Order: No     Mobility Bed Mobility Overal bed mobility: Modified Independent             General bed mobility comments: adjustable bed at home, able to manage technique without rails but HOB elevated    Transfers Overall transfer level: Needs assistance Equipment used: Rolling walker (2 wheels) Transfers: Sit to/from Stand Sit to Stand: Supervision, From elevated surface           General transfer comment: for safety, good posture and hand placement      Balance Overall balance assessment: Needs assistance Sitting-balance support: No upper extremity supported, Feet supported Sitting balance-Leahy Scale: Good     Standing balance support: Bilateral upper extremity supported, During functional activity, Single extremity supported Standing balance-Leahy Scale: Poor Standing balance comment: relies on BUE support                           ADL either performed or assessed with clinical judgement   ADL Overall ADL's : Needs assistance/impaired                                     Functional mobility during ADLs: Supervision/safety;Rolling walker (2 wheels);Cueing for safety General ADL Comments: pt able to manage ADLs with supervision, using figure 4 technique.  Spouse plans to assist as needed to increased ease. Spouse assists with tub transfers.     Vision   Vision Assessment?: No apparent visual deficits  Perception         Praxis         Pertinent Vitals/Pain Pain Assessment Pain Assessment: Faces Faces Pain Scale: Hurts a little bit Pain Location: surgical site, neck Pain Descriptors / Indicators: Discomfort, Operative site guarding Pain Intervention(s): Limited activity within patient's tolerance, Monitored during session, Repositioned      Extremity/Trunk Assessment Upper Extremity Assessment Upper Extremity Assessment: Overall WFL for tasks assessed (within cervical precautions)   Lower Extremity Assessment Lower Extremity Assessment: Overall WFL for tasks assessed (not formally assessed)   Cervical / Trunk Assessment Cervical / Trunk Assessment: Neck Surgery   Communication Communication Communication: No apparent difficulties   Cognition Arousal: Alert Behavior During Therapy: WFL for tasks assessed/performed Cognition: No apparent impairments                               Following commands: Intact       Cueing  General Comments   Cueing Techniques: Verbal cues  spouse at side and supportive   Exercises     Shoulder Instructions      Home Living Family/patient expects to be discharged to:: Private residence Living Arrangements: Spouse/significant other Available Help at Discharge: Family;Available 24 hours/day (initally) Type of Home: House Home Access: Ramped entrance     Home Layout: One level     Bathroom Shower/Tub: Chief Strategy Officer: Standard     Home Equipment: BSC/3in1;Rollator (4 wheels);Rolling Walker (2 wheels);Grab bars - tub/shower;Grab bars - toilet;Tub bench;Wheelchair - power;Electric scooter;Other (comment);Adaptive equipment (slide board) Adaptive Equipment: Reacher;Long-handled sponge        Prior Functioning/Environment Prior Level of Function : Independent/Modified Independent             Mobility Comments: pt using rollator vs wc for mobility, limited functional mobility with ambuation ADLs Comments: modified indepednent    OT Problem List: Impaired balance (sitting and/or standing);Decreased activity tolerance;Decreased knowledge of precautions   OT Treatment/Interventions:        OT Goals(Current goals can be found in the care plan section)   Acute Rehab OT Goals Patient Stated Goal: home OT Goal Formulation: With  patient   OT Frequency:       Co-evaluation              AM-PAC OT 6 Clicks Daily Activity     Outcome Measure Help from another person eating meals?: None Help from another person taking care of personal grooming?: A Little Help from another person toileting, which includes using toliet, bedpan, or urinal?: A Little Help from another person bathing (including washing, rinsing, drying)?: A Little Help from another person to put on and taking off regular upper body clothing?: A Little Help from another person to put on and taking off regular lower body clothing?: A Little 6 Click Score: 19   End of Session Equipment Utilized During Treatment: Rolling walker (2 wheels);Cervical collar Nurse Communication: Mobility status;Precautions  Activity Tolerance: Patient tolerated treatment well Patient left: with call bell/phone within reach;with family/visitor present;Other (comment) (sitting EOB)  OT Visit Diagnosis: Other abnormalities of gait and mobility (R26.89);Muscle weakness (generalized) (M62.81);Pain Pain - part of body:  (surgical incision neck)                Time: 9096-9070 OT Time Calculation (min): 26 min Charges:  OT General Charges $OT Visit: 1 Visit OT Evaluation $OT Eval Low Complexity: 1 Low OT Treatments $Self Care/Home Management :  8-22 mins  Etta NOVAK, OT Acute Rehabilitation Services Office [phone] Secure Chat Preferred    Etta GORMAN Hope 05/04/2024, 10:59 AM

## 2024-05-04 NOTE — Op Note (Signed)
 Brief history: The patient is a 73 year old white female on whom another physician performed a C6-7 anterior cervicectomy fusion plating years ago.  She has developed neck pain, bilateral arm pain numbness tingling weakness consistent with a cervical myelopathy.  She has failed medical management and was worked up with a cervical MRI which demonstrated significant stenosis at C3-4, C4-5 and C5-6.  I discussed the various treatment options with the patient and her husband.  She has decided to proceed with surgery.  Preoperative diagnosis: Cervical spondylosis, cervical stenosis, cervical radiculopathy, cervical myelopathy, cervicalgia  Postoperative diagnosis: The same  Procedure: C3-4, C4-5 and C5-6 anterior cervical discectomy/decompression; C3-4, C4-5 and C5-6 interbody arthrodesis with local morcellized autograft bone and Zimmer DBM; insertion of interbody prosthesis at C3-4, C4-5 and C5-6 (DePuy titanium interbody prosthesis); anterior cervical plating from C3-C6 with DePuy Slimlock titanium plate; exploration of cervical fusion/removal of old plate at R3-2  Surgeon: Dr. Chyrl Budge  Asst.: Dr. Rockey Peru   Anesthesia: Gen. endotracheal  Estimated blood loss: 150 cc  Drains: Jackson-Pratt drain in the prevertebral space  Complications: None  Description of procedure: The patient was brought to the operating room by the anesthesia team. General endotracheal anesthesia was induced. A roll was placed under the patient's shoulders to keep the neck in the neutral position. The patient's anterior cervical region was then prepared with Betadine scrub and Betadine solution. Sterile drapes were applied.  The area to be incised was then injected with Marcaine  with epinephrine  solution. I then used a scalpel to make a transverse incision in the patient's left anterior neck. I used the Metzenbaum scissors to dissect through the scar tissue and to divide the platysmal muscle and then to dissect  medial to the sternocleidomastoid muscle, jugular vein, and carotid artery. I carefully dissected down towards the anterior cervical spine identifying the esophagus and retracting it medially. Then using Kitner swabs to clear soft tissue from the anterior cervical spine and to expose the old plate at R3-2.  The old plate was covered with bone spurs.  We explored the fusion by exposing the plate, drilling the old bone spurs away.  We then unlocked the cams and removed the screws then remove the plate.  The arthrodesis at C6-7 looks good.  I then used electrocautery to detach the medial border of the longus colli muscle bilaterally from the C3-4, C4-5 and C5-6 intervertebral disc spaces. I then inserted the Caspar self-retaining retractor underneath the longus colli muscle bilaterally to provide exposure.  We then incised the intervertebral disc at C5-6. We then performed a partial intervertebral discectomy with a pituitary forceps and the Karlin curettes. I then inserted distraction screws into the vertebral bodies at C5 and C6. We then distracted the interspace. We then used the high-speed drill to decorticate the vertebral endplates at C5-6, to drill away the remainder of the intervertebral disc, to drill away some posterior spondylosis, and to thin out the posterior longitudinal ligament. I then incised ligament with the arachnoid knife. We then removed the ligament with a Kerrison punches undercutting the vertebral endplates and decompressing the thecal sac. We then performed foraminotomies about the bilateral C6 nerve roots. This completed the decompression at this level.  We then repeated this procedure in analogous fashion at C4-5 and C3-4 decompressing the thecal sac at the bilateral C5 and C4 nerve roots  We now turned our to attention to the interbody fusion. We used the trial spacers to determine the appropriate size for the interbody prosthesis. We then  pre-filled prosthesis with a combination  of local morcellized autograft bone that we obtained during decompression as well as Zimmer DBM. We then inserted the prosthesis into the distracted interspace at C3-4, C4-5 and C5-6. We then removed the distraction screws. There was a good snug fit of the prosthesis in the interspace in the interspaces.  Having completed the fusion we now turned attention to the anterior spinal instrumentation. We used the high-speed drill to drill away some anterior spondylosis at the disc spaces so that the plate lay down flat. We selected the appropriate length titanium anterior cervical plate. We laid it along the anterior aspect of the vertebral bodies from C3-C6. We then drilled 12 mm holes at C3, C4, C5 and C6. We then secured the plate to the vertebral bodies by placing two 12 mm self-tapping screws at C3, C4, C5 and C6. We then obtained intraoperative radiograph. The demonstrating good position of the instrumentation. We therefore secured the screws the plate the locking each cam. This completed the instrumentation.  We then obtained hemostasis using bipolar electrocautery. We irrigated the wound out with bacitracin  solution. We then removed the retractor. We inspected the esophagus for any damage. There was none apparent.  We placed a 10 mm flat Jackson-Pratt drain in the prevertebral space and tunneled it out through a separate stab wound.  We then reapproximated patient's platysmal muscle with interrupted 3-0 Vicryl suture. We then reapproximated the subcutaneous tissue with interrupted 3-0 Vicryl suture. The skin was reapproximated with Steri-Strips and benzoin. The wound was then covered with bacitracin  ointment. A sterile dressing was applied. The drapes were removed. Patient was subsequently extubated by the anesthesia team and transported to the post anesthesia care unit in stable condition. All sponge instrument and needle counts were reportedly correct at the end of this case.

## 2024-05-04 NOTE — Discharge Summary (Signed)
 Physician Discharge Summary  Patient ID: Megan [name] MRN: [id number] DOB/AGE: Apr 10, 1951 73 y.o.  Admit date: 05/03/2024 Discharge date: 05/04/2024  Admission Diagnoses: Cervical spondylosis, cervical stenosis, cervicalgia, cervical radiculopathy, cervical myelopathy  Discharge Diagnoses: The same Principal Problem:   Cervical spondylosis with myelopathy and radiculopathy   Discharged Condition: good  Hospital Course: I performed a C3-4, C4-5 and C5-6 anterior cervicectomy fusion and plating on the patient on 05/03/2024.  The surgery went well.  The patient's postoperative course was unremarkable.  On postoperative day #1 the patient felt well and requested discharge to home.  The patient, and her husband, were given verbal and written discharge instructions.  All their questions were answered.  Consults: OT, care management Significant Diagnostic Studies: None Treatments: C3-4, C4-5 and C5-6 anterior cervicectomy fusion plating; exploration of cervical fusion/removal of cervical hardware Discharge Exam: Blood pressure (!) 142/71, pulse 90, temperature 98 F (36.7 C), temperature source Oral, resp. rate 19, height 5' 8 (1.727 m), weight 80.7 kg, SpO2 95%. The patient is alert and pleasant.  Her strength is grossly normal.  Her dressing is clean and dry.  There is no hematoma or shift.  Disposition: Home  Discharge Instructions     Call MD for:  difficulty breathing, headache or visual disturbances   Complete by: As directed    Call MD for:  extreme fatigue   Complete by: As directed    Call MD for:  hives   Complete by: As directed    Call MD for:  persistant dizziness or light-headedness   Complete by: As directed    Call MD for:  persistant nausea and vomiting   Complete by: As directed    Call MD for:  redness, tenderness, or signs of infection (pain, swelling, redness, odor or green/yellow discharge around incision site)   Complete by: As directed    Call MD for:   severe uncontrolled pain   Complete by: As directed    Call MD for:  temperature >100.4   Complete by: As directed    Diet - low sodium heart healthy   Complete by: As directed    Discharge instructions   Complete by: As directed    Call [phone] for a followup appointment. Take a stool softener while you are using pain medications.   Driving Restrictions   Complete by: As directed    Do not drive for 2 weeks.   Increase activity slowly   Complete by: As directed    Lifting restrictions   Complete by: As directed    Do not lift more than 5 pounds. No excessive bending or twisting.   May shower / Bathe   Complete by: As directed    Remove the dressing for 3 days after surgery.  You may shower, but leave the incision alone.   Remove dressing in 48 hours   Complete by: As directed       Allergies as of 05/04/2024       Reactions   Shellfish Allergy Anaphylaxis, Swelling, Other (See Comments)   Tongue swells   Oxycodone  Nausea Only   Penicillins Rash, Other (See Comments)   Has patient had a PCN reaction causing immediate rash, facial/tongue/throat swelling, SOB or lightheadedness with hypotension: Yes Has patient had a PCN reaction causing severe rash involving mucus membranes or skin necrosis: No Has patient had a PCN reaction that required hospitalization No Has patient had a PCN reaction occurring within the last 10 years: No If all of the above  answers are NO, then may proceed with Cephalosporin use.        Medication List     STOP taking these medications    acetaminophen  500 MG tablet Commonly known as: TYLENOL        TAKE these medications    amLODipine  10 MG tablet Commonly known as: NORVASC  Take 1 tablet (10 mg total) by mouth daily. for blood pressure.   atorvastatin  40 MG tablet Commonly known as: LIPITOR Take 1 tablet (40 mg total) by mouth daily. for cholesterol.   baclofen  10 MG tablet Commonly known as: LIORESAL  Take 1 tablet (10 mg  total) by mouth 3 (three) times daily. For muscle spasms.   Biotin  5000 MCG Caps Take 5,000 mcg by mouth every morning.   cetirizine  10 MG tablet Commonly known as: ZYRTEC  Take 1 tablet (10 mg total) by mouth daily. For allergies   clobetasol 0.05 % external solution Commonly known as: TEMOVATE Apply 1 Application topically daily as needed (psoriasis).   CoQ10 100 MG Caps Take 100 mg by mouth every evening.   esomeprazole  20 MG capsule Commonly known as: NexIUM  24HR Take 1 capsule (20 mg total) by mouth daily at 12 noon.   gabapentin  300 MG capsule Commonly known as: NEURONTIN  Take 1 capsule (300 mg total) by mouth 2 (two) times daily. For back pain   glipiZIDE  5 MG tablet Commonly known as: GLUCOTROL  TAKE 1 TABLET BY MOUTH TWICE DAILY BEFORE A MEAL FOR DIABETES   lisinopril  20 MG tablet Commonly known as: ZESTRIL  Take 1 tablet (20 mg total) by mouth daily. for blood pressure.   Lubricant Eye Drops 0.4-0.3 % Soln Generic drug: Polyethyl Glycol-Propyl Glycol Place 1-2 drops into both eyes 2 (two) times daily.   metFORMIN  500 MG tablet Commonly known as: GLUCOPHAGE  Take 1 tablet (500 mg total) by mouth 2 (two) times daily with a meal. for diabetes.   nystatin-triamcinolone ointment Commonly known as: MYCOLOG Apply 1 Application topically 2 (two) times daily as needed (irritation).   oxybutynin  3.9 MG/24HR Commonly known as: OXYTROL  Place 1 patch onto the skin See admin instructions. Change every 5 days   oxybutynin  5 MG 24 hr tablet Commonly known as: DITROPAN -XL Take 1 tablet (5 mg total) by mouth at bedtime. For overactive bladder   senna-docusate 8.6-50 MG tablet Commonly known as: Senokot-S Take 2 tablets by mouth 2 (two) times daily. What changed: when to take this   traMADol  50 MG tablet Commonly known as: ULTRAM  Take 1-2 tablets (50-100 mg total) by mouth every 6 (six) hours as needed for moderate pain (pain score 4-6).   Vitamin D  50 MCG (2000 UT)  tablet Take 2,000 Units by mouth daily.         Signed: Reyes JONETTA Budge 05/04/2024, 8:53 AM

## 2024-05-07 ENCOUNTER — Telehealth: Payer: Self-pay

## 2024-05-07 NOTE — Telephone Encounter (Signed)
 Megan [name]

## 2024-05-07 NOTE — Telephone Encounter (Signed)
 I spoke with pt; pt did not go to UC or ED. Pt said Dr Mavis sent in med for nausea but pt has not picked up yet. Pt said she is not quite as nauseated today. No vomiting or abd. Pain; offered pt appt at [facility] but pt wants to wait and see if nausea med helps. Pt will cb if needed for appt. UC & ED precautions given and pt voiced understanding. Sending note to Dr KANDICE since MARLA Gaskins NP is out of office.

## 2024-05-07 NOTE — Patient Instructions (Signed)
 Visit Information  Thank you for taking time to visit with me today. Please don't hesitate to contact me if I can be of assistance to you;  Patient instructions per discharge summary:  Call MD for: difficulty breathing, headache or visual disturbances Call MD for: extreme fatigue Call MD for: hives Call MD for: persistant dizziness or light-headedness Call MD for: persistant nausea and vomiting Call MD for: redness, tenderness, or signs of infection (pain, swelling, redness, odor or Velita Quirk/yellow discharge around incision site) Call MD for: severe uncontrolled pain Call MD for: temperature >100.4 Diet - low sodium heart healthy Discharge instructions Call [phone] for a followup appointment. Take a stool softener while you are using pain medications. Driving Restrictions Do not drive for 2 weeks. Increase activity slowly Lifting restrictions Do not lift more than 5 pounds. No excessive bending or twisting. May shower / Bathe Remove the dressing for 3 days after surgery. You may shower, but leave the incision alone. Remove dressing in 48 hours   Patient verbalizes understanding of instructions and care plan provided today and agrees to view in MyChart. Active MyChart status and patient understanding of how to access instructions and care plan via MyChart confirmed with patient.     The patient has been provided with contact information for the care management team and has been advised to call with any health related questions or concerns.   Please call the care guide team at [phone] if you need to cancel or reschedule your appointment.   Please call the Suicide and Crisis Lifeline: 988 call the [place]  National Suicide Prevention Lifeline: (787) 288-5838 or TTY: 579-331-3710 TTY (930) 120-2015) to talk to a trained counselor call 1-800-273-TALK (toll free, 24 hour hotline) go to [facility] [address], [place] [phone]) if you are  experiencing a Mental Health or Behavioral Health Crisis or need someone to talk to.  Arvin Seip RN, BSN, CCM [employer], Population Health Case Manager Phone: [phone]

## 2024-05-08 NOTE — Telephone Encounter (Signed)
 Noted. Can we call today for update on nausea -did she try nausea medicine through neck surgeon's office?

## 2024-05-08 NOTE — Telephone Encounter (Signed)
 Called patient states she is feeling little better. She has been able to take nausea medications. If any new symptoms she will reach out.

## 2024-05-25 ENCOUNTER — Other Ambulatory Visit: Payer: Self-pay | Admitting: Primary Care

## 2024-05-25 DIAGNOSIS — M4727 Other spondylosis with radiculopathy, lumbosacral region: Secondary | ICD-10-CM

## 2024-05-25 DIAGNOSIS — M4714 Other spondylosis with myelopathy, thoracic region: Secondary | ICD-10-CM

## 2024-05-25 MED ORDER — GABAPENTIN 300 MG PO CAPS: 300.0000 mg | ORAL_CAPSULE | Freq: Two times a day (BID) | ORAL | 0 refills | Status: DC

## 2024-05-25 NOTE — Telephone Encounter (Unsigned)
 Copied from CRM #[id number]. Topic: Clinical - Medication Refill >> May 25, 2024 11:08 AM Roselie BROCKS wrote: Medication: gabapentin  (NEURONTIN ) capsule 300 mg  [[id number]]   Patient is to get a 90 day supply , 1 pill twice a day , pharmacy only send 60 last time.  Has the patient contacted their pharmacy? Yes (Agent: If no, request that the patient contact the pharmacy for the refill. If patient does not wish to contact the pharmacy document the reason why and proceed with request.) (Agent: If yes, when and what did the pharmacy advise?)  This is the patient's preferred pharmacy:  [facility] [address] [place], [place] - [address] [place] [address] Phone: [phone] Fax: [phone]  Is this the correct pharmacy for this prescription? Yes If no, delete pharmacy and type the correct one.   Has the prescription been filled recently? Yes  Is the patient out of the medication? Yes  Has the patient been seen for an appointment in the last year OR does the patient have an upcoming appointment? Yes  Can we respond through MyChart? no  Agent: Please be advised that Rx refills may take up to 3 business days. We ask that you follow-up with your pharmacy.

## 2024-05-29 DIAGNOSIS — M4712 Other spondylosis with myelopathy, cervical region: Secondary | ICD-10-CM | POA: Diagnosis not present

## 2024-05-29 DIAGNOSIS — M4312 Spondylolisthesis, cervical region: Secondary | ICD-10-CM | POA: Diagnosis not present

## 2024-06-18 ENCOUNTER — Other Ambulatory Visit: Payer: Self-pay | Admitting: Primary Care

## 2024-06-18 DIAGNOSIS — N3281 Overactive bladder: Secondary | ICD-10-CM

## 2024-06-25 ENCOUNTER — Other Ambulatory Visit: Payer: Self-pay | Admitting: Primary Care

## 2024-06-25 DIAGNOSIS — M62838 Other muscle spasm: Secondary | ICD-10-CM

## 2024-06-25 DIAGNOSIS — E785 Hyperlipidemia, unspecified: Secondary | ICD-10-CM

## 2024-06-29 ENCOUNTER — Ambulatory Visit (INDEPENDENT_AMBULATORY_CARE_PROVIDER_SITE_OTHER): Admitting: Primary Care

## 2024-06-29 ENCOUNTER — Encounter: Payer: Self-pay | Admitting: Primary Care

## 2024-06-29 VITALS — BP 128/62 | HR 76 | Temp 97.6°F | Ht 68.0 in | Wt 176.0 lb

## 2024-06-29 DIAGNOSIS — R350 Frequency of micturition: Secondary | ICD-10-CM

## 2024-06-29 DIAGNOSIS — Z23 Encounter for immunization: Secondary | ICD-10-CM

## 2024-06-29 DIAGNOSIS — M62838 Other muscle spasm: Secondary | ICD-10-CM

## 2024-06-29 DIAGNOSIS — M792 Neuralgia and neuritis, unspecified: Secondary | ICD-10-CM

## 2024-06-29 DIAGNOSIS — G894 Chronic pain syndrome: Secondary | ICD-10-CM | POA: Diagnosis not present

## 2024-06-29 DIAGNOSIS — E785 Hyperlipidemia, unspecified: Secondary | ICD-10-CM

## 2024-06-29 DIAGNOSIS — I10 Essential (primary) hypertension: Secondary | ICD-10-CM

## 2024-06-29 DIAGNOSIS — Z85118 Personal history of other malignant neoplasm of bronchus and lung: Secondary | ICD-10-CM

## 2024-06-29 DIAGNOSIS — E2839 Other primary ovarian failure: Secondary | ICD-10-CM | POA: Diagnosis not present

## 2024-06-29 DIAGNOSIS — Z0001 Encounter for general adult medical examination with abnormal findings: Secondary | ICD-10-CM

## 2024-06-29 DIAGNOSIS — N3281 Overactive bladder: Secondary | ICD-10-CM

## 2024-06-29 DIAGNOSIS — Z0000 Encounter for general adult medical examination without abnormal findings: Secondary | ICD-10-CM | POA: Diagnosis not present

## 2024-06-29 DIAGNOSIS — E1142 Type 2 diabetes mellitus with diabetic polyneuropathy: Secondary | ICD-10-CM

## 2024-06-29 DIAGNOSIS — Z1211 Encounter for screening for malignant neoplasm of colon: Secondary | ICD-10-CM

## 2024-06-29 LAB — POC URINALSYSI DIPSTICK (AUTOMATED)
Bilirubin, UA: NEGATIVE
Blood, UA: NEGATIVE
Glucose, UA: NEGATIVE
Ketones, UA: NEGATIVE
Nitrite, UA: POSITIVE
Protein, UA: NEGATIVE
Spec Grav, UA: 1.015 (ref 1.010–1.025)
Urobilinogen, UA: NEGATIVE U/dL — AB
pH, UA: 5.5 (ref 5.0–8.0)

## 2024-06-29 MED ORDER — NITROFURANTOIN MONOHYD MACRO 100 MG PO CAPS: 100.0000 mg | ORAL_CAPSULE | Freq: Two times a day (BID) | ORAL | 0 refills | Status: AC

## 2024-06-29 NOTE — Patient Instructions (Signed)
 For muscle spasms you may take 10 to 20 mg of baclofen  3 times daily as needed.  Increase your gabapentin  to 600 mg twice daily for nerve pain.  Start Macrobid  (nitrofurantoin ) tablets for urinary tract infection. Take 1 tablet by mouth twice daily for 5 days.  Stop by the lab prior to leaving today. I will notify you of your results once received.   Call [facility] to schedule your bone density scan.  Please schedule a follow up visit for 6 months for a diabetes check.  It was a pleasure to see you today!

## 2024-06-29 NOTE — Assessment & Plan Note (Signed)
 Repeat A1c pending.  Continue glipizide  5 mg twice daily, metformin  500 mg twice daily. Urine microalbumin due and pending.  Follow-up in 3 to 6 months based on A1c result.

## 2024-06-29 NOTE — Assessment & Plan Note (Signed)
 Stable.  Continue oxybutynin  XL 5 mg daily, oxybutynin  patches every 5 days.

## 2024-06-29 NOTE — Assessment & Plan Note (Addendum)
 Deteriorated.   Increase Baclofen  10-20 mg TID PRN.  Will update Rx once she is running low.

## 2024-06-29 NOTE — Progress Notes (Signed)
 Subjective:    Patient ID: Megan [name], female    DOB: 1951-09-02, 73 y.o.   MRN: [id number]  Megan [name] is a very pleasant 73 y.o. female who presents today for complete physical and follow up of chronic conditions.  She would also like to discuss urinary frequency.  Symptom onset 5 to 6 days ago with urinary frequency and urgency.  She denies hematuria, dysuria, fevers, abdominal pain. She has a history of cystitis with pan resistance.  She would also like to discuss lower extremity numbness/tingling. Chronic since surgery years ago. Currently managed on gabapentin  300 mg BID. She doesn't feel like this is as effective.  She does follow with neurosurgery for her cervical spine.  She plans on asking her neurosurgeon about their opinion  Immunizations: -Tetanus: Completed in 2016 -Influenza: Influenza vaccine provided today.  -Shingles: Completed Shingrix series -Pneumonia: Completed Prevnar 20 in 2022   Diet: Fair diet.  Exercise: No regular exercise.  Eye exam: Completes annually  Dental exam: No recent eye exam  Mammogram: Completed in May 2025 Bone Density Scan: Completed in 2021  Colonoscopy: Completed Cologuard in December 2022 Lung Cancer Screening: Completed in June 2025, scheduled for December 2025  BP Readings from Last 3 Encounters:  06/29/24 128/62  05/04/24 (!) 142/71  05/01/24 120/66        Review of Systems  Constitutional:  Negative for fever and unexpected weight change.  HENT:  Negative for rhinorrhea.   Respiratory:  Negative for cough and shortness of breath.   Cardiovascular:  Negative for chest pain.  Gastrointestinal:  Negative for constipation and diarrhea.  Genitourinary:  Positive for frequency and urgency. Negative for dysuria and hematuria.  Musculoskeletal:  Positive for arthralgias.  Skin:  Negative for rash.  Allergic/Immunologic: Negative for environmental allergies.  Neurological:  Positive for numbness. Negative for  dizziness and headaches.  Psychiatric/Behavioral:  The patient is not nervous/anxious.          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   Constipation due to pain medication    Debility    Depression    Dyspnea    with exertion   Emphysema of lung (HCC)    Encounter for antineoplastic chemotherapy 07/22/2019   Foul smelling urine 07/22/2020   GERD (gastroesophageal reflux disease)    History of blood transfusion    with a surgical procedure   History of bronchitis    not since I quit smoking (07/26/2017)   History of kidney stones    passed stones   History of shingles    Hyperlipidemia    takes Fish Oil daily   Hypertension    Hypoalbuminemia due to protein-calorie malnutrition    L3 vertebral fracture (HCC) 09/01/2017   Lung cancer (HCC) 2023   Lung nodule 04/21/2022   Lung nodule seen on imaging study 08/14/2021   Added automatically from request for surgery [id number]   Myelopathy (HCC) 12/21/2016   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   Neuromuscular disorder (HCC)    tingling/numbness right leg to toes   Numbness and tingling of right arm 12/29/2023   Paraparesis (HCC) 12/15/2016   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   Pneumonia 2009   PONV (postoperative nausea and vomiting)    Radiculopathy 07/28/2017   Restless leg  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   Tinnitus of left ear    Type 2 diabetes mellitus (HCC)    Uses roller walker    Uterine cancer (HCC) 1979   S/P hysterectomy   Weakness    numbness and tingling in both feet r/t back    Social History   Socioeconomic History   Marital status: Married    Spouse name: Will   Number of children: 1   Years of  education: 12   Highest education level: 12th grade  Occupational History   Not on file  Tobacco Use   Smoking status: Former    Current packs/day: 0.00    Average packs/day: 1.5 packs/day for 35.0 years (52.5 ttl pk-yrs)    Types: Cigarettes    Start date: 05/14/1971    Quit date: 05/13/2006    Years since quitting: 18.1   Smokeless tobacco: Never  Vaping Use   Vaping status: Never Used  Substance and Sexual Activity   Alcohol  use: Yes    Alcohol /week: 1.0 - 2.0 standard drink of alcohol     Types: 1 - 2 Glasses of wine per week   Drug use: No   Sexual activity: Not Currently    Birth control/protection: Surgical    Comment: Hysterectomy  Other Topics Concern   Not on file  Social History Narrative   Lives w/ husband   Married.   1 child, 1 grandchildren.   Retired. Once worked for [employer].   Enjoys reading.    Social Drivers of Corporate investment banker Strain: Low Risk  (06/22/2024)   Overall Financial Resource Strain (CARDIA)    Difficulty of Paying Living Expenses: Not hard at all  Food Insecurity: No Food Insecurity (06/22/2024)   Hunger Vital Sign    Worried About Running Out of Food in the Last Year: Never true    Ran Out of Food in the Last Year: Never true  Transportation Needs: No Transportation Needs (06/22/2024)   PRAPARE - Administrator, Civil Service (Medical): No    Lack of Transportation (Non-Medical): No  Physical Activity: Inactive (06/22/2024)   Exercise Vital Sign    Days of Exercise per Week: 0 days    Minutes of Exercise per Session: Not on file  Stress: No Stress Concern Present (06/22/2024)   [employer] of Occupational Health - Occupational Stress Questionnaire    Feeling of Stress: Only a little  Social Connections: Moderately Isolated (06/22/2024)   Social Connection and Isolation Panel    Frequency of Communication with Friends and Family: More than three times a week    Frequency of Social Gatherings with Friends  and Family: Patient declined    Attends Religious Services: Never    Database administrator or Organizations: No    Attends Engineer, structural: Not on file    Marital Status: Married  Catering manager Violence: Not At Risk (05/07/2024)   Humiliation, Afraid, Rape, and Kick questionnaire    Fear of Current or Ex-Partner: No    Emotionally Abused: No    Physically Abused: No    Sexually Abused: No    Past Surgical History:  Procedure Laterality Date   ABDOMINAL EXPOSURE N/A 07/26/2017   Procedure: ABDOMINAL EXPOSURE;  Surgeon: Eliza Lonni RAMAN, MD;  Location: [facility] OR;  Service: Vascular;  Laterality: N/A;   ANTERIOR CERVICAL DECOMP/DISCECTOMY FUSION  2001   ANTERIOR CERVICAL DECOMP/DISCECTOMY FUSION N/A 05/03/2024  Procedure: ANTERIOR CERVICAL DECOMPRESSION/DISCECTOMY FUSION CERVICAL THREE-FOUR, CERVICAL FOUR-FIVE, CERVICAL FIVE-SIX, REMOVAL OLD PLATE CERVICAL SIX-SEVEN;  Surgeon: Mavis Purchase, MD;  Location: [facility] OR;  Service: Neurosurgery;  Laterality: N/A;   ANTERIOR LUMBAR FUSION N/A 07/26/2017   Procedure: Lumbar five-Sacral one Anterior lumbar interbody fusion with Dr. Lonni Blade for approach;  Surgeon: Ditty, Morene Hicks, MD;  Location: [facility] OR;  Service: Neurosurgery;  Laterality: N/A;   APPLICATION OF ROBOTIC ASSISTANCE FOR SPINAL PROCEDURE  12/19/2016   Procedure: APPLICATION OF ROBOTIC ASSISTANCE FOR SPINAL PROCEDURE;  Surgeon: Morene Hicks Ditty, MD;  Location: [facility] OR;  Service: Neurosurgery;;   APPLICATION OF ROBOTIC ASSISTANCE FOR SPINAL PROCEDURE N/A 07/26/2017   Procedure: APPLICATION OF ROBOTIC ASSISTANCE FOR SPINAL PROCEDURE;  Surgeon: Ditty, Morene Hicks, MD;  Location: [facility] OR;  Service: Neurosurgery;  Laterality: N/A;   APPLICATION OF ROBOTIC ASSISTANCE FOR SPINAL PROCEDURE N/A 09/02/2017   Procedure: APPLICATION OF ROBOTIC ASSISTANCE FOR SPINAL PROCEDURE;  Surgeon: Ditty, Morene Hicks, MD;  Location: [facility] OR;  Service: Neurosurgery;  Laterality:  N/A;   BACK SURGERY     BLADDER SUSPENSION  1991   tack   BRONCHIAL BIOPSY  09/15/2021   Procedure: BRONCHIAL BIOPSIES;  Surgeon: Brenna Adine CROME, DO;  Location: [facility];  Service: Pulmonary;;   BRONCHIAL BIOPSY  05/04/2022   Procedure: BRONCHIAL BIOPSIES;  Surgeon: Brenna Adine CROME, DO;  Location: [facility];  Service: Pulmonary;;   BRONCHIAL BRUSHINGS  09/15/2021   Procedure: BRONCHIAL BRUSHINGS;  Surgeon: Brenna Adine CROME, DO;  Location: [facility];  Service: Pulmonary;;   BRONCHIAL NEEDLE ASPIRATION BIOPSY  09/15/2021   Procedure: BRONCHIAL NEEDLE ASPIRATION BIOPSIES;  Surgeon: Brenna Adine CROME, DO;  Location: [facility];  Service: Pulmonary;;   BRONCHIAL NEEDLE ASPIRATION BIOPSY  05/04/2022   Procedure: BRONCHIAL NEEDLE ASPIRATION BIOPSIES;  Surgeon: Brenna Adine CROME, DO;  Location: [facility];  Service: Pulmonary;;   BRONCHIAL WASHINGS  09/15/2021   Procedure: BRONCHIAL WASHINGS;  Surgeon: Brenna Adine CROME, DO;  Location: [facility];  Service: Pulmonary;;   FIDUCIAL MARKER PLACEMENT  09/15/2021   Procedure: FIDUCIAL MARKER PLACEMENT;  Surgeon: Brenna Adine CROME, DO;  Location: [facility];  Service: Pulmonary;;   INGUINAL HERNIA REPAIR Right 1991   INTERCOSTAL NERVE BLOCK Left 06/16/2022   Procedure: INTERCOSTAL NERVE BLOCK;  Surgeon: Shyrl Linnie KIDD, MD;  Location: [facility] OR;  Service: Thoracic;  Laterality: Left;   LUMBAR FUSION  2016; 2017; 07/26/2017   L4-5; L2-3; L5-S1   LYMPH NODE DISSECTION Left 06/16/2022   Procedure: LYMPH NODE DISSECTION;  Surgeon: Shyrl Linnie KIDD, MD;  Location: [facility] OR;  Service: Thoracic;  Laterality: Left;   POSTERIOR LUMBAR FUSION 4 WITH HARDWARE REMOVAL N/A 09/02/2017   Procedure: Lumbar three-four redo laminectomy; Repositioning of Left Sacral two screw; Extension of lumbar fusion to Thoracic twelve;  Surgeon: Ditty, Morene Hicks, MD;  Location: [facility] OR;  Service: Neurosurgery;  Laterality: N/A;   TONSILLECTOMY AND ADENOIDECTOMY  1959   TUMOR  EXCISION     WERTHIN'S TUMORS BOTH SIDES OF NECK   VAGINAL HYSTERECTOMY  1979   VIDEO BRONCHOSCOPY WITH RADIAL ENDOBRONCHIAL ULTRASOUND  09/15/2021   Procedure: VIDEO BRONCHOSCOPY WITH RADIAL ENDOBRONCHIAL ULTRASOUND;  Surgeon: Brenna Adine CROME, DO;  Location: [facility];  Service: Pulmonary;;   VIDEO BRONCHOSCOPY WITH RADIAL ENDOBRONCHIAL ULTRASOUND  05/04/2022   Procedure: VIDEO BRONCHOSCOPY WITH RADIAL ENDOBRONCHIAL ULTRASOUND;  Surgeon: Brenna Adine CROME, DO;  Location: [facility];  Service: Pulmonary;;    Family History  Problem Relation Age of Onset  Diabetes Mother    Dementia Mother    Cirrhosis Mother        Non alcoholic   COPD Father    Diabetes Brother     Allergies  Allergen Reactions   Shellfish Allergy Anaphylaxis, Swelling and Other (See Comments)    Tongue swells   Oxycodone  Nausea Only   Penicillins Rash and Other (See Comments)    Has patient had a PCN reaction causing immediate rash, facial/tongue/throat swelling, SOB or lightheadedness with hypotension: Yes Has patient had a PCN reaction causing severe rash involving mucus membranes or skin necrosis: No Has patient had a PCN reaction that required hospitalization No Has patient had a PCN reaction occurring within the last 10 years: No If all of the above answers are NO, then may proceed with Cephalosporin use.    Current Outpatient Medications on File Prior to Visit  Medication Sig Dispense Refill   amLODipine  (NORVASC ) 10 MG tablet Take 1 tablet (10 mg total) by mouth daily. for blood pressure. 90 tablet 1   atorvastatin  (LIPITOR) 40 MG tablet TAKE 1 TABLET BY MOUTH ONCE DAILY FOR CHOLESTEROL 90 tablet 0   baclofen  (LIORESAL ) 10 MG tablet TAKE 1 TABLET BY MOUTH THREE TIMES DAILY FOR MUSCLE SPASM 270 tablet 0   Biotin  5000 MCG CAPS Take 5,000 mcg by mouth every morning.      cetirizine  (ZYRTEC ) 10 MG tablet Take 1 tablet (10 mg total) by mouth daily. For allergies 90 tablet 0   Cholecalciferol  (VITAMIN  D) 50 MCG (2000 UT) tablet Take 2,000 Units by mouth daily.     clobetasol (TEMOVATE) 0.05 % external solution Apply 1 Application topically daily as needed (psoriasis).     Coenzyme Q10 (COQ10) 100 MG CAPS Take 100 mg by mouth every evening.     esomeprazole  (NEXIUM  24HR) 20 MG capsule Take 1 capsule (20 mg total) by mouth daily at 12 noon. 90 capsule 1   gabapentin  (NEURONTIN ) 300 MG capsule Take 1 capsule (300 mg total) by mouth 2 (two) times daily. For back pain 180 capsule 0   glipiZIDE  (GLUCOTROL ) 5 MG tablet TAKE 1 TABLET BY MOUTH TWICE DAILY BEFORE A MEAL FOR DIABETES 180 tablet 1   lisinopril  (ZESTRIL ) 20 MG tablet Take 1 tablet (20 mg total) by mouth daily. for blood pressure. 90 tablet 1   metFORMIN  (GLUCOPHAGE ) 500 MG tablet Take 1 tablet (500 mg total) by mouth 2 (two) times daily with a meal. for diabetes. 180 tablet 1   nystatin-triamcinolone ointment (MYCOLOG) Apply 1 Application topically 2 (two) times daily as needed (irritation).     oxybutynin  (DITROPAN -XL) 5 MG 24 hr tablet TAKE 1 TABLET BY MOUTH ONCE DAILY AT BEDTIME FOR  OVERACTIVE  BLADDER 90 tablet 0   oxybutynin  (OXYTROL ) 3.9 MG/24HR Place 1 patch onto the skin See admin instructions. Change every 5 days     Polyethyl Glycol-Propyl Glycol (LUBRICANT EYE DROPS) 0.4-0.3 % SOLN Place 1-2 drops into both eyes 2 (two) times daily.     senna-docusate (SENOKOT-S) 8.6-50 MG tablet Take 2 tablets by mouth 2 (two) times daily.     No current facility-administered medications on file prior to visit.    BP 128/62   Pulse 76   Temp 97.6 F (36.4 C) (Temporal)   Ht 5' 8 (1.727 m)   Wt 176 lb (79.8 kg)   SpO2 97%   BMI 26.76 kg/m  Objective:   Physical Exam HENT:     Right Ear: Tympanic membrane and  ear canal normal.     Left Ear: Tympanic membrane and ear canal normal.  Eyes:     Pupils: Pupils are equal, round, and reactive to light.  Cardiovascular:     Rate and Rhythm: Normal rate and regular rhythm.  Pulmonary:      Effort: Pulmonary effort is normal.     Breath sounds: Normal breath sounds.  Abdominal:     General: Bowel sounds are normal.     Palpations: Abdomen is soft.     Tenderness: There is no abdominal tenderness.  Musculoskeletal:     Cervical back: Neck supple.     Comments: C-spine collar in place. Patient mobile by using wheelchair.  Skin:    General: Skin is warm and dry.  Neurological:     Mental Status: She is alert and oriented to person, place, and time.     Cranial Nerves: No cranial nerve deficit.     Deep Tendon Reflexes:     Reflex Scores:      Patellar reflexes are 2+ on the right side and 2+ on the left side. Psychiatric:        Mood and Affect: Mood normal.     Physical Exam        Assessment & Plan:  Encounter for annual general medical examination with abnormal findings in adult Assessment & Plan: Immunizations UTD. Influenza vaccine provided today.  Mammogram UTD Colon cancer screening due in December 2025.  She elects for Cologuard.  Orders placed.  Discussed the importance of a healthy diet and regular exercise in order for weight loss, and to reduce the risk of further co-morbidity.  Exam stable. Labs pending.  Follow up in 1 year for repeat physical.    History of adenocarcinoma of lung Assessment & Plan: Follow up oncology, office notes reviewed from December 2024.  CT chest reviewed from June 2025.   Essential hypertension Assessment & Plan: Controlled.  Continue amlodipine  10 mg daily, lisinopril  20 mg daily. CMP reviewed from August 2025.   Urinary frequency Assessment & Plan: Urinalysis today with 2+ leuks, positive nitrites.  Given her history, coupled with these results, will treat. Start Macrobid  (nitrofurantoin ) tablets for urinary tract infection. Take 1 tablet by mouth twice daily for 5 days.  Urine culture ordered.  Orders: -     POCT Urinalysis Dipstick (Automated) -     Nitrofurantoin  Monohyd Macro; Take 1  capsule (100 mg total) by mouth 2 (two) times daily for 5 days.  Dispense: 10 capsule; Refill: 0 -     Urine Culture  Type 2 diabetes mellitus with peripheral neuropathy (HCC) Assessment & Plan: Repeat A1c pending.  Continue glipizide  5 mg twice daily, metformin  500 mg twice daily. Urine microalbumin due and pending.  Follow-up in 3 to 6 months based on A1c result.  Orders: -     Hemoglobin A1c -     Microalbumin / creatinine urine ratio  Overactive bladder Assessment & Plan: Stable.  Continue oxybutynin  XL 5 mg daily, oxybutynin  patches every 5 days.   Muscle spasms of both lower extremities Assessment & Plan: Deteriorated.   Increase Baclofen  10-20 mg TID PRN.  Will update Rx once she is running low.   Chronic pain syndrome Assessment & Plan: Deteriorated  Please gabapentin  600 mg twice daily with the option for 300 mg in the afternoon if needed.  Increase baclofen  to 10-20 mg TID PRN   Hyperlipidemia, unspecified hyperlipidemia type Assessment & Plan: Repeat lipid panel pending. Continue atorvastatin  40  mg daily.  Orders: -     Lipid panel  Neuropathic pain Assessment & Plan: Deteriorated over the years.  Increase gabapentin  to 600 mg twice daily.  Drowsiness precautions provided. She may also take an extra 300 mg during the middle of the day if she needs to.   Estrogen deficiency -     DG Bone Density; Future  Screening for colon cancer -     Cologuard  Encounter for immunization -     Flu vaccine HIGH DOSE PF(Fluzone Trivalent)    Assessment and Plan Assessment & Plan         Comer MARLA Gaskins, NP    History of Present Illness

## 2024-06-29 NOTE — Assessment & Plan Note (Signed)
 Immunizations UTD. Influenza vaccine provided today.  Mammogram UTD Colon cancer screening due in December 2025.  She elects for Cologuard.  Orders placed.  Discussed the importance of a healthy diet and regular exercise in order for weight loss, and to reduce the risk of further co-morbidity.  Exam stable. Labs pending.  Follow up in 1 year for repeat physical.

## 2024-06-29 NOTE — Assessment & Plan Note (Addendum)
 Deteriorated over the years.  Increase gabapentin  to 600 mg twice daily.  Drowsiness precautions provided. She may also take an extra 300 mg during the middle of the day if she needs to.

## 2024-06-29 NOTE — Assessment & Plan Note (Signed)
 Repeat lipid panel pending. Continue atorvastatin 40 mg daily.

## 2024-06-29 NOTE — Assessment & Plan Note (Signed)
 Controlled.  Continue amlodipine  10 mg daily, lisinopril  20 mg daily. CMP reviewed from August 2025.

## 2024-06-29 NOTE — Assessment & Plan Note (Addendum)
 Deteriorated  Please gabapentin  600 mg twice daily with the option for 300 mg in the afternoon if needed.  Increase baclofen  to 10-20 mg TID PRN

## 2024-06-29 NOTE — Assessment & Plan Note (Signed)
 Urinalysis today with 2+ leuks, positive nitrites.  Given her history, coupled with these results, will treat. Start Macrobid  (nitrofurantoin ) tablets for urinary tract infection. Take 1 tablet by mouth twice daily for 5 days.  Urine culture ordered.

## 2024-06-29 NOTE — Assessment & Plan Note (Signed)
 Follow up oncology, office notes reviewed from December 2024.  CT chest reviewed from June 2025.

## 2024-06-30 LAB — MICROALBUMIN / CREATININE URINE RATIO
Creatinine, Urine: 55 mg/dL (ref 20–275)
Microalb Creat Ratio: 16 mg/g{creat} (ref <30)
Microalb, Ur: 0.9 mg/dL

## 2024-06-30 LAB — HEMOGLOBIN A1C
Hgb A1c MFr Bld: 6.9 % — ABNORMAL HIGH (ref <5.7)
Mean Plasma Glucose: 151 mg/dL
eAG (mmol/L): 8.4 mmol/L

## 2024-06-30 LAB — URINE CULTURE
MICRO NUMBER:: 17114490
SPECIMEN QUALITY:: ADEQUATE

## 2024-06-30 LAB — LIPID PANEL
Cholesterol: 114 mg/dL (ref <200)
HDL: 35 mg/dL — ABNORMAL LOW (ref >=50)
LDL Cholesterol (Calc): 46 mg/dL
Non-HDL Cholesterol (Calc): 79 mg/dL (ref <130)
Total CHOL/HDL Ratio: 3.3 (calc) (ref <5.0)
Triglycerides: 315 mg/dL — ABNORMAL HIGH (ref <150)

## 2024-07-01 ENCOUNTER — Ambulatory Visit: Payer: Self-pay | Admitting: Primary Care

## 2024-07-16 ENCOUNTER — Other Ambulatory Visit: Payer: Self-pay | Admitting: Primary Care

## 2024-07-16 DIAGNOSIS — I10 Essential (primary) hypertension: Secondary | ICD-10-CM

## 2024-07-24 ENCOUNTER — Other Ambulatory Visit: Payer: Self-pay | Admitting: Family Medicine

## 2024-07-24 DIAGNOSIS — I10 Essential (primary) hypertension: Secondary | ICD-10-CM

## 2024-08-08 ENCOUNTER — Other Ambulatory Visit: Payer: Self-pay | Admitting: Primary Care

## 2024-08-08 DIAGNOSIS — I10 Essential (primary) hypertension: Secondary | ICD-10-CM

## 2024-08-13 ENCOUNTER — Other Ambulatory Visit: Payer: Self-pay | Admitting: Primary Care

## 2024-08-13 DIAGNOSIS — M4727 Other spondylosis with radiculopathy, lumbosacral region: Secondary | ICD-10-CM

## 2024-08-13 DIAGNOSIS — M4714 Other spondylosis with myelopathy, thoracic region: Secondary | ICD-10-CM

## 2024-08-19 ENCOUNTER — Other Ambulatory Visit: Payer: Self-pay | Admitting: Primary Care

## 2024-08-19 DIAGNOSIS — M62838 Other muscle spasm: Secondary | ICD-10-CM

## 2024-08-19 DIAGNOSIS — M4714 Other spondylosis with myelopathy, thoracic region: Secondary | ICD-10-CM

## 2024-08-19 DIAGNOSIS — E1165 Type 2 diabetes mellitus with hyperglycemia: Secondary | ICD-10-CM

## 2024-08-19 DIAGNOSIS — M4727 Other spondylosis with radiculopathy, lumbosacral region: Secondary | ICD-10-CM

## 2024-08-20 ENCOUNTER — Inpatient Hospital Stay: Attending: Internal Medicine

## 2024-08-20 ENCOUNTER — Ambulatory Visit (HOSPITAL_COMMUNITY)
Admission: RE | Admit: 2024-08-20 | Discharge: 2024-08-20 | Disposition: A | Source: Ambulatory Visit | Attending: Internal Medicine | Admitting: Internal Medicine

## 2024-08-20 DIAGNOSIS — R918 Other nonspecific abnormal finding of lung field: Secondary | ICD-10-CM | POA: Diagnosis not present

## 2024-08-20 DIAGNOSIS — Z902 Acquired absence of lung [part of]: Secondary | ICD-10-CM | POA: Insufficient documentation

## 2024-08-20 DIAGNOSIS — Z85118 Personal history of other malignant neoplasm of bronchus and lung: Secondary | ICD-10-CM | POA: Diagnosis present

## 2024-08-20 DIAGNOSIS — C3490 Malignant neoplasm of unspecified part of unspecified bronchus or lung: Secondary | ICD-10-CM | POA: Insufficient documentation

## 2024-08-20 LAB — CBC WITH DIFFERENTIAL (CANCER CENTER ONLY)
Abs Immature Granulocytes: 0.01 K/uL (ref 0.00–0.07)
Basophils Absolute: 0 K/uL (ref 0.0–0.1)
Basophils Relative: 0 %
Eosinophils Absolute: 0.1 K/uL (ref 0.0–0.5)
Eosinophils Relative: 2 %
HCT: 35 % — ABNORMAL LOW (ref 36.0–46.0)
Hemoglobin: 11.3 g/dL — ABNORMAL LOW (ref 12.0–15.0)
Immature Granulocytes: 0 %
Lymphocytes Relative: 28 %
Lymphs Abs: 1.4 K/uL (ref 0.7–4.0)
MCH: 27.4 pg (ref 26.0–34.0)
MCHC: 32.3 g/dL (ref 30.0–36.0)
MCV: 84.7 fL (ref 80.0–100.0)
Monocytes Absolute: 0.6 K/uL (ref 0.1–1.0)
Monocytes Relative: 11 %
Neutro Abs: 3 K/uL (ref 1.7–7.7)
Neutrophils Relative %: 59 %
Platelet Count: 234 K/uL (ref 150–400)
RBC: 4.13 MIL/uL (ref 3.87–5.11)
RDW: 15 % (ref 11.5–15.5)
WBC Count: 5.1 K/uL (ref 4.0–10.5)
nRBC: 0 % (ref 0.0–0.2)

## 2024-08-20 LAB — CMP (CANCER CENTER ONLY)
ALT: 21 U/L (ref 0–44)
AST: 22 U/L (ref 15–41)
Albumin: 4.3 g/dL (ref 3.5–5.0)
Alkaline Phosphatase: 104 U/L (ref 38–126)
Anion gap: 13 (ref 5–15)
BUN: 17 mg/dL (ref 8–23)
CO2: 22 mmol/L (ref 22–32)
Calcium: 9.3 mg/dL (ref 8.9–10.3)
Chloride: 104 mmol/L (ref 98–111)
Creatinine: 0.98 mg/dL (ref 0.44–1.00)
GFR, Estimated: >60 mL/min (ref >60)
Glucose, Bld: 185 mg/dL — ABNORMAL HIGH (ref 70–99)
Potassium: 4.6 mmol/L (ref 3.5–5.1)
Sodium: 138 mmol/L (ref 135–145)
Total Bilirubin: 0.3 mg/dL (ref 0.0–1.2)
Total Protein: 7.6 g/dL (ref 6.5–8.1)

## 2024-08-20 MED ORDER — IOHEXOL 300 MG/ML  SOLN
75.0000 mL | Freq: Once | INTRAMUSCULAR | Status: AC | PRN
  Administered 2024-08-20: 75 mL via INTRAVENOUS

## 2024-08-27 ENCOUNTER — Ambulatory Visit: Admitting: Internal Medicine

## 2024-08-27 VITALS — BP 131/67 | HR 75 | Temp 97.8°F | Resp 17 | Ht 68.0 in | Wt 182.0 lb

## 2024-08-27 DIAGNOSIS — Z85118 Personal history of other malignant neoplasm of bronchus and lung: Secondary | ICD-10-CM | POA: Diagnosis not present

## 2024-08-27 DIAGNOSIS — C3490 Malignant neoplasm of unspecified part of unspecified bronchus or lung: Secondary | ICD-10-CM | POA: Diagnosis not present

## 2024-08-27 NOTE — Progress Notes (Signed)
 [facility] [phone]   [phone]  OFFICE PROGRESS NOTE  Megan [name] POUR, NP [address] Megan [name]  DIAGNOSIS: Stage IIb (T3, N0, M0)  non-small cell lung cancer, adenocarcinoma presented with large left lower lobe lung mass diagnosed in August 2023  Molecular studies by foundation 1 showed no actionable mutation and she has negative PD-L1 expression.  PRIOR THERAPY:  1) Status post left lower lobectomy with lymph node sampling under the care of Dr. Shyrl on June 16, 2022 with tumor size of 5.5 cm and visceropleural involvement. 2) Adjuvant systemic chemotherapy with carboplatin  for AUC of 5 and Alimta  500 Mg/M2.  First dose August 09, 2022.  Status post 4 cycles.  CURRENT THERAPY: Observation.  INTERVAL HISTORY: Megan [name] 73 y.o. female returns to the clinic today for follow-up visit accompanied by her husband.Discussed the use of AI scribe software for clinical note transcription with the patient, who gave verbal consent to proceed.  History of Present Illness Megan [name] is a 73 year old female with stage 2B lung adenocarcinoma, status post left lower lobectomy and adjuvant chemotherapy, who presents for evaluation of recent chest CT findings.  She was diagnosed with stage 2B non-small cell lung adenocarcinoma in August 2023 and underwent left lower lobectomy with lymph node sampling, followed by four cycles of adjuvant carboplatin  and pemetrexed , completed in February 2024. She has been under surveillance since completion of chemotherapy and remains asymptomatic from a pulmonary standpoint.  A recent chest CT demonstrated two small adjacent suspicious areas in the right lung.  She also underwent multilevel cervical spine surgery on May 03, 2024, and continues to wear a cervical collar. Since October 2025, she has experienced intermittent right arm pain and numbness, with occasional stabbing  pain depending on arm position. She is scheduled for a three-month postoperative follow-up with neurosurgery tomorrow.      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   Constipation due to pain medication    Debility    Depression    Dyspnea    with exertion   Emphysema of lung (HCC)    Encounter for antineoplastic chemotherapy 07/22/2019   Foul smelling urine 07/22/2020   GERD (gastroesophageal reflux disease)    History of blood transfusion    with a surgical procedure   History of bronchitis    not since I quit smoking (07/26/2017)   History of kidney stones    passed stones   History of shingles    Hyperlipidemia    takes Fish Oil daily   Hypertension    Hypoalbuminemia due to protein-calorie malnutrition    L3 vertebral fracture (HCC) 09/01/2017   Lung cancer (HCC) 2023   Lung nodule 04/21/2022   Lung nodule seen on imaging study 08/14/2021   Added automatically from request for surgery [id number]   Myelopathy (HCC) 12/21/2016   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   Neuromuscular disorder (HCC)    tingling/numbness right leg to toes   Numbness and tingling of right arm 12/29/2023   Paraparesis (HCC) 12/15/2016   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  MEDICATIONS:   Current Outpatient Medications  Medication Sig Dispense Refill   amLODipine  (NORVASC ) 10 MG tablet Take 1 tablet by mouth once daily for blood pressure 90 tablet 2   atorvastatin  (LIPITOR) 40 MG tablet TAKE 1 TABLET BY MOUTH ONCE DAILY FOR CHOLESTEROL 90 tablet 0   baclofen  (LIORESAL ) 10 MG tablet TAKE 1 TABLET BY MOUTH THREE TIMES DAILY FOR MUSCLE SPASM 270 tablet 0   Biotin  5000 MCG CAPS Take 5,000 mcg by mouth every morning.      cetirizine  (ZYRTEC ) 10 MG tablet Take 1 tablet (10 mg total) by mouth daily. For allergies 90 tablet 0   Cholecalciferol  (VITAMIN D ) 50 MCG (2000 UT) tablet Take 2,000 Units by mouth daily.     clobetasol (TEMOVATE) 0.05 % external solution Apply 1 Application topically daily as needed (psoriasis).     Coenzyme Q10 (COQ10) 100 MG CAPS Take 100 mg by mouth every evening.     esomeprazole  (NEXIUM  24HR) 20 MG capsule Take 1 capsule (20 mg total) by mouth daily at 12 noon. 90 capsule 1   gabapentin  (NEURONTIN ) 300 MG capsule Take 2 capsules (600 mg total) by mouth 2 (two) times daily. For pain 360 capsule 2   glipiZIDE  (GLUCOTROL ) 5 MG tablet TAKE 1 TABLET BY MOUTH TWICE DAILY BEFORE A MEAL FOR DIABETES 180 tablet 1   lisinopril  (ZESTRIL ) 20 MG tablet Take 1 tablet by mouth once daily for blood pressure 90 tablet 2   metFORMIN  (GLUCOPHAGE ) 500 MG tablet TAKE 1 TABLET BY MOUTH TWICE DAILY WITH A MEAL FOR DIABETES 180 tablet 1   nystatin-triamcinolone ointment (MYCOLOG) Apply 1 Application topically 2 (two) times daily as needed (irritation).     oxybutynin  (DITROPAN -XL) 5 MG 24 hr tablet TAKE 1 TABLET BY MOUTH ONCE DAILY AT BEDTIME FOR  OVERACTIVE  BLADDER 90 tablet 0   oxybutynin  (OXYTROL ) 3.9 MG/24HR Place 1 patch onto the skin See admin instructions. Change every 5 days     Polyethyl Glycol-Propyl Glycol (LUBRICANT EYE DROPS) 0.4-0.3 % SOLN Place 1-2 drops into both eyes 2 (two) times daily.     senna-docusate (SENOKOT-S) 8.6-50 MG tablet Take 2 tablets by mouth 2  (two) times daily.     No current facility-administered medications for this visit.    SURGICAL HISTORY:  Past Surgical History:  Procedure Laterality Date   ABDOMINAL EXPOSURE N/A 07/26/2017   Procedure: ABDOMINAL EXPOSURE;  Surgeon: Eliza Lonni RAMAN, MD;  Location: [facility] OR;  Service: Vascular;  Laterality: N/A;   ANTERIOR CERVICAL DECOMP/DISCECTOMY FUSION  2001   ANTERIOR CERVICAL DECOMP/DISCECTOMY FUSION N/A 05/03/2024   Procedure: ANTERIOR CERVICAL DECOMPRESSION/DISCECTOMY FUSION CERVICAL THREE-FOUR, CERVICAL FOUR-FIVE, CERVICAL FIVE-SIX, REMOVAL OLD PLATE CERVICAL SIX-SEVEN;  Surgeon: Mavis Purchase, MD;  Location: [facility] OR;  Service: Neurosurgery;  Laterality: N/A;   ANTERIOR LUMBAR FUSION N/A 07/26/2017   Procedure: Lumbar five-Sacral one Anterior lumbar interbody fusion with Dr. Lonni Eliza for approach;  Surgeon: Ditty, Morene Hicks, MD;  Location: [facility] OR;  Service: Neurosurgery;  Laterality: N/A;   APPLICATION OF ROBOTIC ASSISTANCE FOR SPINAL PROCEDURE  12/19/2016  Procedure: APPLICATION OF ROBOTIC ASSISTANCE FOR SPINAL PROCEDURE;  Surgeon: Morene Hicks Ditty, MD;  Location: [facility] OR;  Service: Neurosurgery;;   APPLICATION OF ROBOTIC ASSISTANCE FOR SPINAL PROCEDURE N/A 07/26/2017   Procedure: APPLICATION OF ROBOTIC ASSISTANCE FOR SPINAL PROCEDURE;  Surgeon: Ditty, Morene Hicks, MD;  Location: [facility] OR;  Service: Neurosurgery;  Laterality: N/A;   APPLICATION OF ROBOTIC ASSISTANCE FOR SPINAL PROCEDURE N/A 09/02/2017   Procedure: APPLICATION OF ROBOTIC ASSISTANCE FOR SPINAL PROCEDURE;  Surgeon: Ditty, Morene Hicks, MD;  Location: [facility] OR;  Service: Neurosurgery;  Laterality: N/A;   BACK SURGERY     BLADDER SUSPENSION  1991   tack   BRONCHIAL BIOPSY  09/15/2021   Procedure: BRONCHIAL BIOPSIES;  Surgeon: Brenna Adine CROME, DO;  Location: [facility];  Service: Pulmonary;;   BRONCHIAL BIOPSY  05/04/2022   Procedure: BRONCHIAL BIOPSIES;  Surgeon: Brenna Adine CROME, DO;  Location: [facility]  ENDOSCOPY;  Service: Pulmonary;;   BRONCHIAL BRUSHINGS  09/15/2021   Procedure: BRONCHIAL BRUSHINGS;  Surgeon: Brenna Adine CROME, DO;  Location: [facility];  Service: Pulmonary;;   BRONCHIAL NEEDLE ASPIRATION BIOPSY  09/15/2021   Procedure: BRONCHIAL NEEDLE ASPIRATION BIOPSIES;  Surgeon: Brenna Adine CROME, DO;  Location: [facility];  Service: Pulmonary;;   BRONCHIAL NEEDLE ASPIRATION BIOPSY  05/04/2022   Procedure: BRONCHIAL NEEDLE ASPIRATION BIOPSIES;  Surgeon: Brenna Adine CROME, DO;  Location: [facility];  Service: Pulmonary;;   BRONCHIAL WASHINGS  09/15/2021   Procedure: BRONCHIAL WASHINGS;  Surgeon: Brenna Adine CROME, DO;  Location: [facility];  Service: Pulmonary;;   FIDUCIAL MARKER PLACEMENT  09/15/2021   Procedure: FIDUCIAL MARKER PLACEMENT;  Surgeon: Brenna Adine CROME, DO;  Location: [facility];  Service: Pulmonary;;   INGUINAL HERNIA REPAIR Right 1991   INTERCOSTAL NERVE BLOCK Left 06/16/2022   Procedure: INTERCOSTAL NERVE BLOCK;  Surgeon: Shyrl Linnie KIDD, MD;  Location: [facility] OR;  Service: Thoracic;  Laterality: Left;   LUMBAR FUSION  2016; 2017; 07/26/2017   L4-5; L2-3; L5-S1   LYMPH NODE DISSECTION Left 06/16/2022   Procedure: LYMPH NODE DISSECTION;  Surgeon: Shyrl Linnie KIDD, MD;  Location: [facility] OR;  Service: Thoracic;  Laterality: Left;   POSTERIOR LUMBAR FUSION 4 WITH HARDWARE REMOVAL N/A 09/02/2017   Procedure: Lumbar three-four redo laminectomy; Repositioning of Left Sacral two screw; Extension of lumbar fusion to Thoracic twelve;  Surgeon: Ditty, Morene Hicks, MD;  Location: [facility] OR;  Service: Neurosurgery;  Laterality: N/A;   TONSILLECTOMY AND ADENOIDECTOMY  1959   TUMOR EXCISION     WERTHIN'S TUMORS BOTH SIDES OF NECK   VAGINAL HYSTERECTOMY  1979   VIDEO BRONCHOSCOPY WITH RADIAL ENDOBRONCHIAL ULTRASOUND  09/15/2021   Procedure: VIDEO BRONCHOSCOPY WITH RADIAL ENDOBRONCHIAL ULTRASOUND;  Surgeon: Brenna Adine CROME, DO;  Location: [facility];  Service: Pulmonary;;   VIDEO  BRONCHOSCOPY WITH RADIAL ENDOBRONCHIAL ULTRASOUND  05/04/2022   Procedure: VIDEO BRONCHOSCOPY WITH RADIAL ENDOBRONCHIAL ULTRASOUND;  Surgeon: Brenna Adine CROME, DO;  Location: [facility];  Service: Pulmonary;;    REVIEW OF SYSTEMS:  Constitutional: positive for fatigue Eyes: negative Ears, nose, mouth, throat, and face: negative Respiratory: negative Cardiovascular: negative Gastrointestinal: negative Genitourinary:negative Integument/breast: negative Hematologic/lymphatic: negative Musculoskeletal:positive for neck pain Neurological: negative Behavioral/Psych: negative Endocrine: negative Allergic/Immunologic: negative   PHYSICAL EXAMINATION: General appearance: alert, cooperative, and no distress Head: Normocephalic, without obvious abnormality, atraumatic Neck: no adenopathy, no JVD, supple, symmetrical, trachea midline, and thyroid  not enlarged, symmetric, no tenderness/mass/nodules Lymph nodes: Cervical, supraclavicular, and axillary nodes normal. Resp: clear to auscultation bilaterally Back: symmetric, no curvature. ROM  normal. No CVA tenderness. Cardio: regular rate and rhythm, S1, S2 normal, no murmur, click, rub or gallop GI: soft, non-tender; bowel sounds normal; no masses,  no organomegaly Extremities: extremities normal, atraumatic, no cyanosis or edema Neurologic: Alert and oriented X 3, normal strength and tone. Normal symmetric reflexes. Normal coordination and gait  ECOG PERFORMANCE STATUS: 1 - Symptomatic but completely ambulatory  Pulse 75, temperature 97.8 F (36.6 C), temperature source Temporal, resp. rate 17, height 5' 8 (1.727 m), weight 182 lb (82.6 kg), SpO2 99%.  LABORATORY DATA: Lab Results  Component Value Date   WBC 5.1 08/20/2024   HGB 11.3 (L) 08/20/2024   HCT 35.0 (L) 08/20/2024   MCV 84.7 08/20/2024   PLT 234 08/20/2024      Chemistry      Component Value Date/Time   NA 138 08/20/2024 1420   K 4.6 08/20/2024 1420   CL 104 08/20/2024  1420   CO2 22 08/20/2024 1420   BUN 17 08/20/2024 1420   CREATININE 0.98 08/20/2024 1420   CREATININE 0.84 07/31/2021 1600      Component Value Date/Time   CALCIUM  9.3 08/20/2024 1420   ALKPHOS 104 08/20/2024 1420   AST 22 08/20/2024 1420   ALT 21 08/20/2024 1420   BILITOT 0.3 08/20/2024 1420       RADIOGRAPHIC STUDIES: CT Chest W Contrast Result Date: 08/24/2024 EXAM: CT CHEST WITH CONTRAST 08/20/2024 03:45:56 PM TECHNIQUE: CT of the chest was performed with the administration of intravenous contrast. Multiplanar reformatted images are provided for review. Automated exposure control, iterative reconstruction, and/or weight based adjustment of the mA/kV was utilized to reduce the radiation dose to as low as reasonably achievable. CONTRAST: 75 cc omnipaque  300 COMPARISON: 02/20/2024 CLINICAL HISTORY: Non-small cell lung cancer (NSCLC), staging. * Tracking Code: BO * FINDINGS: MEDIASTINUM: Heart and pericardium are unremarkable. The central airways are clear. Thoracic aortic, coronary artery, and branch vessel atheromatous vascular calcifications. LYMPH NODES: No mediastinal, hilar or axillary lymphadenopathy. LUNGS AND PLEURA: Left lower lobectomy. Spiculated 1.0 x 0.8 cm right lower lobe nodule on image 89 series 6, high suspicion for developing malignancy, with adjacent subsolid 1.0 x 0.6 cm nodule in image 94 series 6 also suspicious for malignancy. Cylindrical and slightly branching 1.0 x 0.5 cm right lower lobe nodule on image 77 series 6 aligned with a preceding bronchus, probably from chronic plugging and bronchiectasis. No pleural effusion or pneumothorax. SOFT TISSUES/BONES: Postoperative findings in the lower cervical spine. Postoperative findings in the lower thoracic and upper lumbar spine. Spondylosis and degenerative disc disease at T7-T8 and T8-T9 with minimal chronic anterior wedging at T8. UPPER ABDOMEN: Limited images of the upper abdomen demonstrate a stable 6 mm hypodense lesion  in the right hepatic lobe on image 125 series 2, compatible with benign lesion. No further imaging workup of this lesion is indicated. Abdominal aortic atherosclerosis with atheromatous plaque along the aortic origin of the superior mesenteric artery, without overt superior mesenteric artery occlusion. IMPRESSION: 1. Spiculated 1.0 x 0.8 cm right lower lobe nodule with an adjacent subsolid 1.0 x 0.6 cm nodule in the right lower lobe, both suspicious for malignancy; consider PET/CT and tissue sampling for further evaluation. 2. Left lower lobectomy. 3. Postoperative changes in the lower thoracic and upper lumbar spine, and in the lower cervical spine. 4. Thoracic aortic, coronary artery, and branch vessel atherosclerotic vascular calcifications, and abdominal aortic atherosclerosis with plaque at the origin of the superior mesenteric artery without occlusion. 5. Cylindrical and slightly branching 1.0  x 0.5 cm right lower lobe nodule aligned with a preceding bronchus, favoring chronic mucus impaction and bronchiectasis. 6. Degenerative changes including cervical postoperative levels, and thoracic spondylosis and degenerative disc disease at T7-T8 and T8-T9 with minimal chronic anterior wedging at T8. Electronically signed by: Ryan Salvage MD 08/24/2024 09:18 AM EST RP Workstation: [id number]    ASSESSMENT AND PLAN: This is a very pleasant 73 years old white female with Stage IIb (T3, N0, M0)  non-small cell lung cancer, adenocarcinoma presented with large left lower lobe lung mass diagnosed in August 2023 status post left lower lobectomy with lymph node sampling under the care of Dr. Shyrl on June 16, 2022 with tumor size of 5.5 cm and visceropleural involvement. Molecular studies by foundation 1 showed no actionable mutation and she has negative PD-L1 expression. The patient started adjuvant systemic chemotherapy with carboplatin  for AUC of 5 and Alimta  500 Mg/M2 status post 4 cycles.  Last dose was  given October 11, 2022.  She had repeat CT scan of the chest performed recently.  I personally and independently reviewed the scan images and discussed the result and showed the images to the patient and her mom.  Her scan showed a spiculated 1.0 x 0.8 cm right lower lobe nodule with an adjacent subsolid 1.0 x 0.6 cm nodule in the right lower lobe and possible suspicious for malignancy. Assessment and Plan Assessment & Plan Stage IIB adenocarcinoma of the lung, post left lower lobectomy and adjuvant chemotherapy Stage IIB lung adenocarcinoma, status post left lower lobectomy with lymph node sampling and completion of adjuvant chemotherapy in February 2024. She remains under surveillance for disease recurrence. - Continued surveillance for recurrence per standard post-treatment protocol.  Suspicious right lung nodules under evaluation Recent chest CT demonstrated two small, suspicious right lung nodules. Differential includes inflammation versus malignancy, given her oncologic history. Further diagnostic evaluation is indicated to assess for recurrence or new primary malignancy. - Ordered PET scan of the chest to further characterize right lung nodules. - Coordinated PET scan for late December to optimize insurance deductible benefits as discussed. - Scheduled follow-up in early January to review PET scan results and determine subsequent management. The patient was advised to call immediately if she has any concerning symptoms in the interval. The patient voices understanding of current disease status and treatment options and is in agreement with the current care plan.  All questions were answered. The patient knows to call the clinic with any problems, questions or concerns. We can certainly see the patient much sooner if necessary.  The total time spent in the appointment was 30 minutes.  Disclaimer: This note was dictated with voice recognition software. Similar sounding words can  inadvertently be transcribed and may not be corrected upon review.

## 2024-08-28 ENCOUNTER — Telehealth: Payer: Self-pay | Admitting: Internal Medicine

## 2024-08-28 NOTE — Telephone Encounter (Signed)
 Scheduled patient for follow-up in January. Called and spoke with the patient, she is aware.

## 2024-08-29 LAB — COLOGUARD: COLOGUARD: POSITIVE — AB

## 2024-09-03 ENCOUNTER — Other Ambulatory Visit: Payer: Self-pay | Admitting: Primary Care

## 2024-09-03 DIAGNOSIS — M62838 Other muscle spasm: Secondary | ICD-10-CM

## 2024-09-05 ENCOUNTER — Encounter (HOSPITAL_COMMUNITY)
Admission: RE | Admit: 2024-09-05 | Discharge: 2024-09-05 | Disposition: A | Discharge status: Home / Self Care | Source: Ambulatory Visit | Attending: Internal Medicine | Admitting: Internal Medicine

## 2024-09-05 DIAGNOSIS — C3490 Malignant neoplasm of unspecified part of unspecified bronchus or lung: Secondary | ICD-10-CM | POA: Insufficient documentation

## 2024-09-05 LAB — GLUCOSE, CAPILLARY: Glucose-Capillary: 138 mg/dL — ABNORMAL HIGH (ref 70–99)

## 2024-09-05 MED ORDER — FLUDEOXYGLUCOSE F - 18 (FDG) INJECTION: 9.0000 | Freq: Once | INTRAVENOUS | Status: DC | PRN

## 2024-09-17 ENCOUNTER — Other Ambulatory Visit: Payer: Self-pay | Admitting: Primary Care

## 2024-09-17 DIAGNOSIS — E1165 Type 2 diabetes mellitus with hyperglycemia: Secondary | ICD-10-CM

## 2024-09-17 DIAGNOSIS — N3281 Overactive bladder: Secondary | ICD-10-CM

## 2024-09-19 ENCOUNTER — Inpatient Hospital Stay: Attending: Internal Medicine | Admitting: Internal Medicine

## 2024-09-19 VITALS — BP 126/78 | HR 73 | Temp 97.6°F | Resp 17 | Ht 68.0 in | Wt 180.6 lb

## 2024-09-19 DIAGNOSIS — Z85118 Personal history of other malignant neoplasm of bronchus and lung: Secondary | ICD-10-CM

## 2024-09-19 DIAGNOSIS — Z902 Acquired absence of lung [part of]: Secondary | ICD-10-CM | POA: Diagnosis not present

## 2024-09-19 NOTE — Progress Notes (Signed)
 "     [facility] Telephone:([phone]) [phone]   [phone]  OFFICE PROGRESS NOTE  Megan Comer POUR, NP [address] [name] [name] [id number]  DIAGNOSIS: Stage IIb (T3, N0, M0)  non-small cell lung cancer, adenocarcinoma presented with large left lower lobe lung mass diagnosed in August 2023  Molecular studies by [facility] 1 showed no actionable mutation and she has negative PD-L1 expression.  PRIOR THERAPY:  1) Status post left lower lobectomy with lymph node sampling under the care of Dr. Shyrl on June 16, 2022 with tumor size of 5.5 cm and visceropleural involvement. 2) Adjuvant systemic chemotherapy with carboplatin  for AUC of 5 and Alimta  500 Mg/M2.  First dose August 09, 2022.  Status post 4 cycles.  CURRENT THERAPY: Observation.  INTERVAL HISTORY: Megan [name] 74 y.o. female returns to the clinic today for follow-up visit accompanied by her husband.Discussed the use of AI scribe software for clinical note transcription with the patient, who gave verbal consent to proceed.  History of Present Illness Megan [name] is a 74 year old female with stage IIB left lower lobe lung adenocarcinoma, status post lobectomy and adjuvant chemotherapy, who presents for evaluation of slowly enlarging right lower lobe pulmonary nodules.  Megan [name] was diagnosed with stage IIB left lower lobe lung adenocarcinoma in August 2023, with no actionable mutations and negative PD-L1. She underwent left lower lobectomy with lymph node sampling in October 2023, followed by four cycles of adjuvant carboplatin  and pemetrexed . She has been under surveillance since March 2024.  Surveillance imaging in March 2024 identified a spiculated nodule and an adjacent subsolid nodule in the right lower lobe. A recent PET scan showed no hypermetabolic activity in these nodules. She denies new or worsening pulmonary symptoms, including cough, dyspnea, or chest pain.      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   Constipation due to pain medication    Debility    Depression    Dyspnea    with exertion   Emphysema of lung (HCC)    Encounter for antineoplastic chemotherapy 07/22/2019   Foul smelling urine 07/22/2020   GERD (gastroesophageal reflux disease)    History of blood transfusion    with a surgical procedure   History of bronchitis    not since I quit smoking (07/26/2017)   History of kidney stones    passed stones   History of shingles    Hyperlipidemia    takes Fish Oil daily   Hypertension    Hypoalbuminemia due to protein-calorie malnutrition    L3 vertebral fracture (HCC) 09/01/2017   Lung cancer (HCC) 2023   Lung nodule 04/21/2022   Lung nodule seen on imaging study 08/14/2021   Added automatically from request for surgery [id number]   Myelopathy (HCC) 12/21/2016   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   Neuromuscular disorder (HCC)    tingling/numbness right leg to toes   Numbness and tingling of right arm 12/29/2023   Paraparesis (HCC) 12/15/2016   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  MEDICATIONS:  Current Outpatient Medications  Medication Sig Dispense Refill   amLODipine  (NORVASC ) 10 MG tablet Take 1 tablet by mouth once daily  for blood pressure 90 tablet 2   atorvastatin  (LIPITOR) 40 MG tablet TAKE 1 TABLET BY MOUTH ONCE DAILY FOR CHOLESTEROL 90 tablet 0   baclofen  (LIORESAL ) 10 MG tablet TAKE 1 TABLET BY MOUTH THREE TIMES DAILY FOR MUSCLE SPASM 270 tablet 2   Biotin  5000 MCG CAPS Take 5,000 mcg by mouth every morning.      cetirizine  (ZYRTEC ) 10 MG tablet Take 1 tablet (10 mg total) by mouth daily. For allergies 90 tablet 0   Cholecalciferol  (VITAMIN D ) 50 MCG (2000 UT) tablet Take 2,000 Units by mouth daily.     clobetasol (TEMOVATE) 0.05 % external solution Apply 1 Application topically daily as needed (psoriasis).     Coenzyme Q10 (COQ10) 100 MG CAPS Take 100 mg by mouth every evening.     esomeprazole  (NEXIUM  24HR) 20 MG capsule Take 1 capsule (20 mg total) by mouth daily at 12 noon. 90 capsule 1   gabapentin  (NEURONTIN ) 300 MG capsule Take 2 capsules (600 mg total) by mouth 2 (two) times daily. For pain 360 capsule 2   glipiZIDE  (GLUCOTROL ) 5 MG tablet TAKE 1 TABLET BY MOUTH TWICE DAILY BEFORE A MEAL FOR DIABETES 180 tablet 1   lisinopril  (ZESTRIL ) 20 MG tablet Take 1 tablet by mouth once daily for blood pressure 90 tablet 2   metFORMIN  (GLUCOPHAGE ) 500 MG tablet TAKE 1 TABLET BY MOUTH TWICE DAILY WITH A MEAL FOR DIABETES 180 tablet 1   nystatin-triamcinolone ointment (MYCOLOG) Apply 1 Application topically 2 (two) times daily as needed (irritation).     oxybutynin  (DITROPAN -XL) 5 MG 24 hr tablet TAKE 1 TABLET BY MOUTH AT BEDTIME FOR  OVERACTIVE  BLADDER 90 tablet 2   oxybutynin  (OXYTROL ) 3.9 MG/24HR Place 1 patch onto the skin See admin instructions. Change every 5 days     Polyethyl Glycol-Propyl Glycol (LUBRICANT EYE DROPS) 0.4-0.3 % SOLN Place 1-2 drops into both eyes 2 (two) times daily.     senna-docusate (SENOKOT-S) 8.6-50 MG tablet Take 2 tablets by mouth 2 (two) times daily.     No current facility-administered medications for this visit.    SURGICAL HISTORY:  Past Surgical History:  Procedure  Laterality Date   ABDOMINAL EXPOSURE N/A 07/26/2017   Procedure: ABDOMINAL EXPOSURE;  Surgeon: Eliza Lonni RAMAN, MD;  Location[facility] OR;  Service: Vascular;  Laterality: N/A;   ANTERIOR CERVICAL DECOMP/DISCECTOMY FUSION  2001   ANTERIOR CERVICAL DECOMP/DISCECTOMY FUSION N/A 05/03/2024   Procedure: ANTERIOR CERVICAL DECOMPRESSION/DISCECTOMY FUSION CERVICAL THREE-FOUR, CERVICAL FOUR-FIVE, CERVICAL FIVE-SIX, REMOVAL OLD PLATE CERVICAL SIX-SEVEN;  Surgeon: Mavis Purchase, MD;  Location: [facility] OR;  Service: Neurosurgery;  Laterality: N/A;   ANTERIOR LUMBAR FUSION N/A 07/26/2017   Procedure: Lumbar five-Sacral one Anterior lumbar interbody fusion with Dr. Lonni Eliza for approach;  Surgeon: Ditty, Morene Hicks, MD;  Location: [facility] OR;  Service: Neurosurgery;  Laterality: N/A;   APPLICATION OF ROBOTIC ASSISTANCE FOR SPINAL PROCEDURE  12/19/2016   Procedure: APPLICATION OF ROBOTIC ASSISTANCE FOR SPINAL PROCEDURE;  Surgeon: Morene Hicks Ditty, MD;  Location: [facility] OR;  Service: Neurosurgery;;   APPLICATION OF ROBOTIC ASSISTANCE FOR SPINAL PROCEDURE N/A 07/26/2017   Procedure: APPLICATION OF ROBOTIC ASSISTANCE FOR SPINAL PROCEDURE;  Surgeon: Ditty, Morene Hicks, MD;  Location: [facility] OR;  Service: Neurosurgery;  Laterality: N/A;   APPLICATION OF ROBOTIC ASSISTANCE FOR SPINAL PROCEDURE N/A 09/02/2017   Procedure: APPLICATION OF ROBOTIC ASSISTANCE FOR SPINAL PROCEDURE;  Surgeon: Ditty, Morene Hicks, MD;  Location: [facility] OR;  Service: Neurosurgery;  Laterality: N/A;   BACK SURGERY     BLADDER SUSPENSION  1991   tack   BRONCHIAL BIOPSY  09/15/2021   Procedure: BRONCHIAL BIOPSIES;  Surgeon: Brenna Adine CROME, DO;  Location: [facility] ENDOSCOPY;  Service: Pulmonary;;   BRONCHIAL BIOPSY  05/04/2022   Procedure: BRONCHIAL BIOPSIES;  Surgeon: Brenna Adine CROME, DO;  Location: [facility] ENDOSCOPY;  Service: Pulmonary;;   BRONCHIAL BRUSHINGS  09/15/2021   Procedure: BRONCHIAL BRUSHINGS;  Surgeon: Brenna Adine CROME, DO;  Location: [facility]  ENDOSCOPY;  Service: Pulmonary;;   BRONCHIAL NEEDLE ASPIRATION BIOPSY  09/15/2021   Procedure: BRONCHIAL NEEDLE ASPIRATION BIOPSIES;  Surgeon: Brenna Adine CROME, DO;  Location: [facility] ENDOSCOPY;  Service: Pulmonary;;   BRONCHIAL NEEDLE ASPIRATION BIOPSY  05/04/2022   Procedure: BRONCHIAL NEEDLE ASPIRATION BIOPSIES;  Surgeon: Brenna Adine CROME, DO;  Location: [facility] ENDOSCOPY;  Service: Pulmonary;;   BRONCHIAL WASHINGS  09/15/2021   Procedure: BRONCHIAL WASHINGS;  Surgeon: Brenna Adine CROME, DO;  Location: [facility] ENDOSCOPY;  Service: Pulmonary;;   FIDUCIAL MARKER PLACEMENT  09/15/2021   Procedure: FIDUCIAL MARKER PLACEMENT;  Surgeon: Brenna Adine CROME, DO;  Location: [facility] ENDOSCOPY;  Service: Pulmonary;;   INGUINAL HERNIA REPAIR Right 1991   INTERCOSTAL NERVE BLOCK Left 06/16/2022   Procedure: INTERCOSTAL NERVE BLOCK;  Surgeon: Shyrl Linnie KIDD, MD;  Location: [facility] OR;  Service: Thoracic;  Laterality: Left;   LUMBAR FUSION  2016; 2017; 07/26/2017   L4-5; L2-3; L5-S1   LYMPH NODE DISSECTION Left 06/16/2022   Procedure: LYMPH NODE DISSECTION;  Surgeon: Shyrl Linnie KIDD, MD;  Location: [facility] OR;  Service: Thoracic;  Laterality: Left;   POSTERIOR LUMBAR FUSION 4 WITH HARDWARE REMOVAL N/A 09/02/2017   Procedure: Lumbar three-four redo laminectomy; Repositioning of Left Sacral two screw; Extension of lumbar fusion to Thoracic twelve;  Surgeon: Ditty, Morene Hicks, MD;  Location: [facility] OR;  Service: Neurosurgery;  Laterality: N/A;   TONSILLECTOMY AND ADENOIDECTOMY  1959   TUMOR EXCISION     WERTHIN'S TUMORS BOTH SIDES OF NECK   VAGINAL HYSTERECTOMY  1979   VIDEO BRONCHOSCOPY WITH RADIAL ENDOBRONCHIAL ULTRASOUND  09/15/2021   Procedure: VIDEO BRONCHOSCOPY WITH RADIAL ENDOBRONCHIAL ULTRASOUND;  Surgeon: Brenna Adine CROME, DO;  Location: [facility] ENDOSCOPY;  Service: Pulmonary;;   VIDEO BRONCHOSCOPY WITH RADIAL ENDOBRONCHIAL ULTRASOUND  05/04/2022   Procedure: VIDEO BRONCHOSCOPY WITH RADIAL ENDOBRONCHIAL ULTRASOUND;  Surgeon: Brenna Adine CROME, DO;  Location: [facility] ENDOSCOPY;  Service: Pulmonary;;    REVIEW OF SYSTEMS:  Constitutional: positive for fatigue Eyes: negative Ears, nose, mouth, throat, and face: negative Respiratory: negative Cardiovascular: negative Gastrointestinal: negative Genitourinary:negative Integument/breast: negative Hematologic/lymphatic: negative Musculoskeletal:positive for neck pain Neurological: negative Behavioral/Psych: negative Endocrine: negative Allergic/Immunologic: negative   PHYSICAL EXAMINATION: General appearance: alert, cooperative, and no distress Head: Normocephalic, without obvious abnormality, atraumatic Neck: no adenopathy, no JVD, supple, symmetrical, trachea midline, and thyroid  not enlarged, symmetric, no tenderness/mass/nodules Lymph nodes: Cervical, supraclavicular, and axillary nodes normal. Resp: clear to auscultation bilaterally Back: symmetric, no curvature. ROM normal. No CVA tenderness. Cardio: regular rate and rhythm, S1, S2 normal, no murmur, click, rub or gallop GI:  soft, non-tender; bowel sounds normal; no masses,  no organomegaly Extremities: extremities normal, atraumatic, no cyanosis or edema Neurologic: Alert and oriented X 3, normal strength and tone. Normal symmetric reflexes. Normal coordination and gait  ECOG PERFORMANCE STATUS: 1 - Symptomatic but completely ambulatory  Blood pressure 126/78, pulse 73, temperature 97.6 F (36.4 C), temperature source Temporal, resp. rate 17, height 5' 8 (1.727 m), weight 180 lb 9.6 oz (81.9 kg), SpO2 97%.  LABORATORY DATA: Lab Results  Component Value Date   WBC 5.1 08/20/2024   HGB 11.3 (L) 08/20/2024   HCT 35.0 (L) 08/20/2024   MCV 84.7 08/20/2024   PLT 234 08/20/2024      Chemistry      Component Value Date/Time   NA 138 08/20/2024 1420   K 4.6 08/20/2024 1420   CL 104 08/20/2024 1420   CO2 22 08/20/2024 1420   BUN 17 08/20/2024 1420   CREATININE 0.98 08/20/2024 1420   CREATININE 0.84  07/31/2021 1600      Component Value Date/Time   CALCIUM  9.3 08/20/2024 1420   ALKPHOS 104 08/20/2024 1420   AST 22 08/20/2024 1420   ALT 21 08/20/2024 1420   BILITOT 0.3 08/20/2024 1420       RADIOGRAPHIC STUDIES: NM PET Image Restage (PS) Skull Base to Thigh (F-18 FDG) Result Date: 09/17/2024 CLINICAL DATA:  Subsequent treatment strategy for lung cancer. EXAM: NUCLEAR MEDICINE PET SKULL BASE TO THIGH TECHNIQUE: 5.0 mCi F-18 FDG was injected intravenously. Full-ring PET imaging was performed from the skull base to thigh after the radiotracer. CT data was obtained and used for attenuation correction and anatomic localization. Fasting blood glucose: 138 mg/dl COMPARISON:  CT chest 87/91/7974 and PET 04/09/2022. FINDINGS: Mediastinal blood pool activity: SUV max 3.1 Liver activity: SUV max NA NECK: No abnormal hypermetabolism. Incidental CT findings: None. CHEST: Anterolateral right lower lobe nodule measures 11 mm (9 x 13 mm, 7/41), SUV max 1.3. Slightly inferomedially within the right lower lobe is an adjacent 8 x 11 mm nodule (7/43), without measurable hypermetabolism. No additional abnormal hypermetabolism. Incidental CT findings: Atherosclerotic calcification of the aorta, aortic valve and coronary arteries. Heart is at the upper limits of normal in size to mildly enlarged. No pericardial or pleural effusion. Left lower lobectomy. ABDOMEN/PELVIS: No abnormal hypermetabolism. Incidental CT findings: Liver is decreased in attenuation diffusely. SKELETON: No abnormal hypermetabolism. Incidental CT findings: Osteopenia. Thoracolumbar fusion. Degenerative changes in the spine. IMPRESSION: 1. 11 mm anterolateral right lower lobe nodule does not show metabolism above blood pool but is visualized. Adjacent 10 mm right lower lobe nodule does not show abnormal hypermetabolism. However, nodules have enlarged from 02/20/2024 and remain worrisome for bronchogenic carcinoma. 2. Hepatic steatosis. 3. Aortic  atherosclerosis (ICD10-I70.0). Coronary artery calcification. Electronically Signed   By: Newell Eke M.D.   On: 09/17/2024 16:01   CT Chest W Contrast Result Date: 08/24/2024 EXAM: CT CHEST WITH CONTRAST 08/20/2024 03:45:56 PM TECHNIQUE: CT of the chest was performed with the administration of intravenous contrast. Multiplanar reformatted images are provided for review. Automated exposure control, iterative reconstruction, and/or weight based adjustment of the mA/kV was utilized to reduce the radiation dose to as low as reasonably achievable. CONTRAST: 75 cc omnipaque  300 COMPARISON: 02/20/2024 CLINICAL HISTORY: Non-small cell lung cancer (NSCLC), staging. * Tracking Code: BO * FINDINGS: MEDIASTINUM: Heart and pericardium are unremarkable. The central airways are clear. Thoracic aortic, coronary artery, and branch vessel atheromatous vascular calcifications. LYMPH NODES: No mediastinal, hilar or axillary lymphadenopathy. LUNGS AND PLEURA: Left  lower lobectomy. Spiculated 1.0 x 0.8 cm right lower lobe nodule on image 89 series 6, high suspicion for developing malignancy, with adjacent subsolid 1.0 x 0.6 cm nodule in image 94 series 6 also suspicious for malignancy. Cylindrical and slightly branching 1.0 x 0.5 cm right lower lobe nodule on image 77 series 6 aligned with a preceding bronchus, probably from chronic plugging and bronchiectasis. No pleural effusion or pneumothorax. SOFT TISSUES/BONES: Postoperative findings in the lower cervical spine. Postoperative findings in the lower thoracic and upper lumbar spine. Spondylosis and degenerative disc disease at T7-T8 and T8-T9 with minimal chronic anterior wedging at T8. UPPER ABDOMEN: Limited images of the upper abdomen demonstrate a stable 6 mm hypodense lesion in the right hepatic lobe on image 125 series 2, compatible with benign lesion. No further imaging workup of this lesion is indicated. Abdominal aortic atherosclerosis with atheromatous plaque along  the aortic origin of the superior mesenteric artery, without overt superior mesenteric artery occlusion. IMPRESSION: 1. Spiculated 1.0 x 0.8 cm right lower lobe nodule with an adjacent subsolid 1.0 x 0.6 cm nodule in the right lower lobe, both suspicious for malignancy; consider PET/CT and tissue sampling for further evaluation. 2. Left lower lobectomy. 3. Postoperative changes in the lower thoracic and upper lumbar spine, and in the lower cervical spine. 4. Thoracic aortic, coronary artery, and branch vessel atherosclerotic vascular calcifications, and abdominal aortic atherosclerosis with plaque at the origin of the superior mesenteric artery without occlusion. 5. Cylindrical and slightly branching 1.0 x 0.5 cm right lower lobe nodule aligned with a preceding bronchus, favoring chronic mucus impaction and bronchiectasis. 6. Degenerative changes including cervical postoperative levels, and thoracic spondylosis and degenerative disc disease at T7-T8 and T8-T9 with minimal chronic anterior wedging at T8. Electronically signed by: Ryan Salvage MD 08/24/2024 09:18 AM EST RP Workstation: [id number]    ASSESSMENT AND PLAN: This is a very pleasant 74 years old white female with Stage IIb (T3, N0, M0)  non-small cell lung cancer, adenocarcinoma presented with large left lower lobe lung mass diagnosed in August 2023 status post left lower lobectomy with lymph node sampling under the care of Dr. Shyrl on June 16, 2022 with tumor size of 5.5 cm and visceropleural involvement. Molecular studies by foundation 1 showed no actionable mutation and she has negative PD-L1 expression. The patient started adjuvant systemic chemotherapy with carboplatin  for AUC of 5 and Alimta  500 Mg/M2 status post 4 cycles.  Last dose was given October 11, 2022.  She has been on observation since that time but recent imaging studies including CT scan of the chest followed by a PET scan showed spiculated right lower lobe lung nodule  with adjacent subsolid nodule in the right lower lobe suspicious for malignancy.  The PET scan showed no significant hypermetabolic activity but still suspicious for malignancy. I discussed the lab results with the patient and her husband. Assessment and Plan Assessment & Plan Suspicious right lower lobe pulmonary nodules under surveillance She has two slowly enlarging, radiographically suspicious right lower lobe pulmonary nodules. PET imaging demonstrated no hypermetabolic activity, but interval growth remains concerning for malignancy. The nodules are not currently demonstrating aggressive behavior, but further evaluation is indicated to clarify etiology and guide management. - Referred to pulmonology for biopsy of the right lower lobe nodules to establish diagnosis. - Discussed management options, including continued surveillance versus biopsy; she elected to proceed with biopsy to obtain definitive diagnosis and guide further therapy if indicated. - Provided copy of PET scan results. - Planned  follow-up in approximately six weeks to review biopsy results and determine next steps, including possible referral to radiation oncology if malignancy is confirmed.  Stage 2B non-sponsored lung cancer, status post left lower lobectomy and adjuvant chemotherapy She underwent left lower lobectomy and adjuvant chemotherapy for stage 2B non-sponsored lung cancer. She has been under surveillance since March 2024 with no evidence of recurrence in the left lung. Current concern is for new right lower lobe nodules. - Continued surveillance for recurrence or new malignancy. - If biopsy of right lower lobe nodules confirms malignancy, refer to radiation oncology for consideration of local therapy. The patient was advised to call immediately if she has any other concerning symptoms in the interval.  The patient voices understanding of current disease status and treatment options and is in agreement with the current  care plan.  All questions were answered. The patient knows to call the clinic with any problems, questions or concerns. We can certainly see the patient much sooner if necessary.  The total time spent in the appointment was 30 minutes including review of chart and various tests results, discussions about plan of care and coordination of care plan .   Disclaimer: This note was dictated with voice recognition software. Similar sounding words can inadvertently be transcribed and may not be corrected upon review.        "

## 2024-10-01 ENCOUNTER — Other Ambulatory Visit: Payer: Self-pay | Admitting: Primary Care

## 2024-10-01 DIAGNOSIS — E785 Hyperlipidemia, unspecified: Secondary | ICD-10-CM

## 2024-10-02 ENCOUNTER — Other Ambulatory Visit: Payer: Self-pay | Admitting: Medical Oncology

## 2024-10-02 DIAGNOSIS — Z85118 Personal history of other malignant neoplasm of bronchus and lung: Secondary | ICD-10-CM

## 2024-10-18 ENCOUNTER — Telehealth: Payer: Self-pay | Admitting: Pulmonary Disease

## 2024-10-18 ENCOUNTER — Ambulatory Visit: Admitting: Emergency Medicine

## 2024-10-18 ENCOUNTER — Encounter: Payer: Self-pay | Admitting: Emergency Medicine

## 2024-10-18 ENCOUNTER — Telehealth: Payer: Self-pay

## 2024-10-18 VITALS — BP 118/78 | HR 79 | Ht 68.0 in | Wt 181.0 lb

## 2024-10-18 DIAGNOSIS — R918 Other nonspecific abnormal finding of lung field: Secondary | ICD-10-CM

## 2024-10-18 DIAGNOSIS — R911 Solitary pulmonary nodule: Secondary | ICD-10-CM | POA: Insufficient documentation

## 2024-10-18 DIAGNOSIS — Z87891 Personal history of nicotine dependence: Secondary | ICD-10-CM

## 2024-10-18 NOTE — Telephone Encounter (Signed)
 I have moved her Bronch to 11:15 instead of 2:45.  I have notified the patient. She is aware of the CT as well.

## 2024-10-18 NOTE — Telephone Encounter (Signed)
 Lm x1 for the patient.

## 2024-10-18 NOTE — Assessment & Plan Note (Signed)
 Right lower lobe pulmonary nodules in a patient with history of stage IIb left-sided adenocarcinoma.  Suspicious for possible new primaries/recurrence.  Agree that she needs navigational bronchoscopy to obtain biopsies.  My next available is 11/05/2024 which is difficult for her due to logistics, transportation.  Dr. Malka has availability at [facility] on 10/23/2024 and we will work on setting this up.  We reviewed your CT scan of the chest and your PET scan today. We will work on arranging navigational bronchoscopy and endobronchial ultrasound with Dr. Malka at [facility] to evaluate right lower lobe pulmonary nodules seen on her chest imaging.  This will be done as an outpatient under general anesthesia.  We will try to get this scheduled for 10/23/2024.  You will need a designated driver and someone to watch you at home that day after the procedure. Keep your planned follow-up visit with Dr. Sherrod on 10/30/2024

## 2024-10-18 NOTE — Patient Instructions (Addendum)
 We reviewed your CT scan of the chest and your PET scan today. We will work on arranging navigational bronchoscopy and endobronchial ultrasound with Dr. Malka at [facility] to evaluate right lower lobe pulmonary nodules seen on her chest imaging.  This will be done as an outpatient under general anesthesia.  We will try to get this scheduled for 10/23/2024.  You will need a designated driver and someone to watch you at home that day after the procedure. Keep your planned follow-up visit with Dr. Sherrod on 10/30/2024

## 2024-10-18 NOTE — Progress Notes (Signed)
 "  Subjective:    Patient ID: Megan [name], female    DOB: [birth year], 74 y.o.   MRN: [id number]  HPI 74 year old former smoker (51 pack years) who has been seen in our office by Dr. Brenna and by GORMAN. Ruthell, NP.  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  She was diagnosed with a stage IIb left lower lobe adenocarcinoma by navigational bronchoscopy in 04/2022 by Dr. Brenna.  She underwent a left lower lobe lobectomy and lymph node sampling (Dr. Shyrl).  There was visceropleural involvement.  She underwent 4 cycles of adjuvant chemotherapy and has been on observation followed by Dr. Sherrod.  A right lower lobe nodule was identified 11/2022 without any hypermetabolism which has been followed.  The most recent imaging shows spiculated right lower lobe nodule with adjacent subsolid nodule suspicious for malignancy. Today she reports that her breathing has been stable. She uses a walker, often a wheelchair due to LE neuropathy. No real cough. Not on any BD.   CT chest 08/20/2024 reviewed by me shows spiculated 10 x 8 mm right lower lobe pulmonary nodule with an adjacent subsolid 10 x 6 mm nodule both suspicious for malignancy.  There is a cylindrical and slightly branching 10 x 5 mm right lower lobe nodule that looks more like chronic mucous plugging.   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   Constipation due to pain medication    Debility    Depression    Dyspnea    with exertion   Emphysema of lung (HCC)    Encounter for antineoplastic chemotherapy 07/22/2019   Foul smelling urine 07/22/2020   GERD (gastroesophageal reflux disease)    History of blood  transfusion    with a surgical procedure   History of bronchitis    not since I quit smoking (07/26/2017)   History of kidney stones    passed stones   History of shingles    Hyperlipidemia    takes Fish Oil daily   Hypertension    Hypoalbuminemia due to protein-calorie malnutrition    L3 vertebral fracture (HCC) 09/01/2017   Lung cancer (HCC) 2023   Lung nodule 04/21/2022   Lung nodule seen on imaging study 08/14/2021   Added automatically from request for surgery [id number]   Myelopathy (HCC) 12/21/2016   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   Neuromuscular disorder (HCC)    tingling/numbness right leg to toes   Numbness and tingling of right arm 12/29/2023   Paraparesis (HCC) 12/15/2016   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   Pneumonia 2009   PONV (postoperative nausea and vomiting)    Radiculopathy 07/28/2017   Restless leg    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   Tinnitus of left ear    Type 2 diabetes mellitus (HCC)    Uses roller walker    Uterine cancer (HCC) 1979   S/P hysterectomy   Weakness    numbness and tingling in both feet r/t back     Family History  Problem Relation Age of Onset   Diabetes Mother    Dementia Mother    Cirrhosis Mother  Non alcoholic   COPD Father    Diabetes Brother      Social History   Socioeconomic History   Marital status: Married    Spouse name: Will   Number of children: 1   Years of education: 12   Highest education level: 12th grade  Occupational History   Not on file  Tobacco Use   Smoking status: Former    Current packs/day: 0.00    Average packs/day: 1.5 packs/day for 35.0 years (52.5 ttl pk-yrs)    Types: Cigarettes    Start date: 05/14/1971    Quit date: 05/13/2006    Years since quitting: 18.4   Smokeless tobacco: Never  Vaping Use   Vaping status: Never Used  Substance and Sexual Activity    Alcohol  use: Yes    Alcohol /week: 1.0 - 2.0 standard drink of alcohol     Types: 1 - 2 Glasses of wine per week   Drug use: No   Sexual activity: Not Currently    Birth control/protection: Surgical    Comment: Hysterectomy  Other Topics Concern   Not on file  Social History Narrative   Lives w/ husband   Married.   1 child, 1 grandchildren.   Retired. Once worked for [employer].   Enjoys reading.    Social Drivers of Health   Tobacco Use: Medium Risk (10/18/2024)   Patient History    Smoking Tobacco Use: Former    Smokeless Tobacco Use: Never    Passive Exposure: Not on file  Financial Resource Strain: Low Risk (06/22/2024)   Overall Financial Resource Strain (CARDIA)    Difficulty of Paying Living Expenses: Not hard at all  Food Insecurity: No Food Insecurity (06/22/2024)   Epic    Worried About Programme Researcher, Broadcasting/film/video in the Last Year: Never true    Ran Out of Food in the Last Year: Never true  Transportation Needs: No Transportation Needs (06/22/2024)   Epic    Lack of Transportation (Medical): No    Lack of Transportation (Non-Medical): No  Physical Activity: Inactive (06/22/2024)   Exercise Vital Sign    Days of Exercise per Week: 0 days    Minutes of Exercise per Session: Not on file  Stress: No Stress Concern Present (06/22/2024)   [employer] of Occupational Health - Occupational Stress Questionnaire    Feeling of Stress: Only a little  Social Connections: Moderately Isolated (06/22/2024)   Social Connection and Isolation Panel    Frequency of Communication with Friends and Family: More than three times a week    Frequency of Social Gatherings with Friends and Family: Patient declined    Attends Religious Services: Never    Database Administrator or Organizations: No    Attends Engineer, Structural: Not on file    Marital Status: Married  Catering Manager Violence: Not At Risk (05/07/2024)   Epic    Fear of Current or Ex-Partner: No    Emotionally  Abused: No    Physically Abused: No    Sexually Abused: No  Depression (PHQ2-9): Low Risk (09/19/2024)   Depression (PHQ2-9)    PHQ-2 Score: 1  Alcohol  Screen: Low Risk (06/22/2024)   Alcohol  Screen    Last Alcohol  Screening Score (AUDIT): 2  Housing: Unknown (06/22/2024)   Epic    Unable to Pay for Housing in the Last Year: No    Number of Times Moved in the Last Year: Not on file    Homeless in the Last Year:  No  Utilities: Not At Risk (05/07/2024)   Epic    Threatened with loss of utilities: No  Health Literacy: Adequate Health Literacy (01/05/2024)   B1300 Health Literacy    Frequency of need for help with medical instructions: Never     Allergies[1]   Outpatient Medications Prior to Visit  Medication Sig Dispense Refill   amLODipine  (NORVASC ) 10 MG tablet Take 1 tablet by mouth once daily for blood pressure 90 tablet 2   atorvastatin  (LIPITOR) 40 MG tablet TAKE 1 TABLET BY MOUTH ONCE DAILY FOR CHOLESTEROL 90 tablet 2   baclofen  (LIORESAL ) 10 MG tablet TAKE 1 TABLET BY MOUTH THREE TIMES DAILY FOR MUSCLE SPASM 270 tablet 2   Biotin  5000 MCG CAPS Take 5,000 mcg by mouth every morning.      cetirizine  (ZYRTEC ) 10 MG tablet Take 1 tablet (10 mg total) by mouth daily. For allergies 90 tablet 0   Cholecalciferol  (VITAMIN D ) 50 MCG (2000 UT) tablet Take 2,000 Units by mouth daily.     clobetasol (TEMOVATE) 0.05 % external solution Apply 1 Application topically daily as needed (psoriasis).     Coenzyme Q10 (COQ10) 100 MG CAPS Take 100 mg by mouth every evening.     esomeprazole  (NEXIUM  24HR) 20 MG capsule Take 1 capsule (20 mg total) by mouth daily at 12 noon. 90 capsule 1   gabapentin  (NEURONTIN ) 300 MG capsule Take 2 capsules (600 mg total) by mouth 2 (two) times daily. For pain 360 capsule 2   glipiZIDE  (GLUCOTROL ) 5 MG tablet TAKE 1 TABLET BY MOUTH TWICE DAILY BEFORE A MEAL FOR DIABETES 180 tablet 1   lisinopril  (ZESTRIL ) 20 MG tablet Take 1 tablet by mouth once daily for blood  pressure 90 tablet 2   metFORMIN  (GLUCOPHAGE ) 500 MG tablet TAKE 1 TABLET BY MOUTH TWICE DAILY WITH A MEAL FOR DIABETES 180 tablet 1   nystatin-triamcinolone ointment (MYCOLOG) Apply 1 Application topically 2 (two) times daily as needed (irritation).     oxybutynin  (DITROPAN -XL) 5 MG 24 hr tablet TAKE 1 TABLET BY MOUTH AT BEDTIME FOR  OVERACTIVE  BLADDER 90 tablet 2   oxybutynin  (OXYTROL ) 3.9 MG/24HR Place 1 patch onto the skin See admin instructions. Change every 5 days     Polyethyl Glycol-Propyl Glycol (LUBRICANT EYE DROPS) 0.4-0.3 % SOLN Place 1-2 drops into both eyes 2 (two) times daily.     senna-docusate (SENOKOT-S) 8.6-50 MG tablet Take 2 tablets by mouth 2 (two) times daily.     No facility-administered medications prior to visit.        Objective:   Physical Exam Vitals:   10/18/24 1005  BP: 118/78  Pulse: 79  SpO2: 96%  Weight: 181 lb (82.1 kg)  Height: 5' 8 (1.727 m)    Gen: Pleasant, well-nourished, in no distress,  normal affect, in a wheelchair  ENT: No lesions,  mouth clear,  oropharynx clear, no postnasal drip  Neck: No JVD, no stridor  Lungs: No use of accessory muscles, no crackles or wheezing on normal respiration, no wheeze on forced expiration  Cardiovascular: RRR, heart sounds normal, no murmur or gallops, no peripheral edema  Musculoskeletal: No deformities, no cyanosis or clubbing  Neuro: alert, awake, right lower extremity weakness  Skin: Warm, no lesions or rash     Assessment & Plan:   Pulmonary nodules Right lower lobe pulmonary nodules in a patient with history of stage IIb left-sided adenocarcinoma.  Suspicious for possible new primaries/recurrence.  Agree that she needs navigational bronchoscopy  to obtain biopsies.  My next available is 11/05/2024 which is difficult for her due to logistics, transportation.  Dr. Malka has availability at [facility] on 10/23/2024 and we will work on setting this up.  We reviewed your CT scan of the chest and  your PET scan today. We will work on arranging navigational bronchoscopy and endobronchial ultrasound with Dr. Malka at [facility] to evaluate right lower lobe pulmonary nodules seen on her chest imaging.  This will be done as an outpatient under general anesthesia.  We will try to get this scheduled for 10/23/2024.  You will need a designated driver and someone to watch you at home that day after the procedure. Keep your planned follow-up visit with Dr. Sherrod on 10/30/2024    I personally spent a total of 59 minutes in the care of the patient today including preparing to see the patient, getting/reviewing separately obtained history, performing a medically appropriate exam/evaluation, counseling and educating, referring and communicating with other health care professionals, documenting clinical information in the EHR, independently interpreting results, and communicating results.   Lamar Chris, MD, PhD 10/18/2024, 10:45 AM [facility] [phone] or if no answer before 7:00PM call [phone] For any issues after 7:00PM please call eLink [phone]      [1]  Allergies Allergen Reactions   Shellfish Allergy Anaphylaxis, Swelling and Other (See Comments)    Tongue swells   Oxycodone  Nausea Only   Penicillins Rash and Other (See Comments)    Has patient had a PCN reaction causing immediate rash, facial/tongue/throat swelling, SOB or lightheadedness with hypotension: Yes Has patient had a PCN reaction causing severe rash involving mucus membranes or skin necrosis: No Has patient had a PCN reaction that required hospitalization No Has patient had a PCN reaction occurring within the last 10 years: No If all of the above answers are NO, then may proceed with Cephalosporin use.   "

## 2024-10-18 NOTE — Telephone Encounter (Signed)
 Scheduling for a RAB for a right lower lobe nodule.   Darrin Barn, MD [facility] 10/18/2024 10:59 AM

## 2024-10-18 NOTE — Telephone Encounter (Signed)
 Is there anything earlier that day for the bronch,patient is worrisome about going until 2:45 without eating as she is a diabetic,informed her she will receive a call from the endo nurses in regards to which diabetes medications to take as well as when you arrive give you Iv dextrose  if her levels are low,verbalized understanding still wants to look into earlier times to reduce time not eating after midnight

## 2024-10-18 NOTE — Telephone Encounter (Signed)
 Robotic Bronchoscopy 10/23/2024 at 2:45pm Lung Nodule [id number]  Megan [name] please see bronch info.  Email has been sent.

## 2024-10-18 NOTE — Telephone Encounter (Signed)
 Copied from CRM [id number]. Topic: General - Other >> Oct 18, 2024  2:30 PM Celestine F wrote: Reason for CRM: Pt is calling CMA Evalene back to ask some questions about her biopsy scheduled for Robotic Bronchoscopy 10/23/2024 at 2:45pm Lung Nodule [id number]  Pt stated her best phone number is [phone] ok to leave a vm.

## 2024-10-19 NOTE — Telephone Encounter (Signed)
 Noted. Nothing further needed.

## 2024-10-19 NOTE — Telephone Encounter (Signed)
 Auth #[id number] valid 10/18/24 to 01/16/2025 for the code [id number]

## 2024-10-22 ENCOUNTER — Ambulatory Visit

## 2024-10-23 ENCOUNTER — Encounter: Admission: RE | Payer: Self-pay

## 2024-10-23 ENCOUNTER — Ambulatory Visit: Admit: 2024-10-23 | Admitting: Pulmonary Disease

## 2024-10-23 ENCOUNTER — Ambulatory Visit: Admission: RE | Admit: 2024-10-23 | Admitting: Pulmonary Disease

## 2024-10-23 DIAGNOSIS — R911 Solitary pulmonary nodule: Secondary | ICD-10-CM | POA: Insufficient documentation

## 2024-10-30 ENCOUNTER — Inpatient Hospital Stay: Attending: Internal Medicine | Admitting: Internal Medicine

## 2024-12-27 ENCOUNTER — Ambulatory Visit: Admitting: Primary Care

## 2025-01-08 ENCOUNTER — Ambulatory Visit

## 2025-01-28 ENCOUNTER — Other Ambulatory Visit (HOSPITAL_BASED_OUTPATIENT_CLINIC_OR_DEPARTMENT_OTHER)
